# Patient Record
Sex: Female | Born: 1960 | Race: Black or African American | Hispanic: No | Smoking: Former smoker
Health system: Southern US, Community
[De-identification: ages and names within clinical notes are randomized; demographics above are authoritative.]

## PROBLEM LIST (undated history)

## (undated) DIAGNOSIS — E119 Type 2 diabetes mellitus without complications: Secondary | ICD-10-CM

## (undated) DIAGNOSIS — R609 Edema, unspecified: Secondary | ICD-10-CM

## (undated) DIAGNOSIS — E876 Hypokalemia: Secondary | ICD-10-CM

## (undated) DIAGNOSIS — R739 Hyperglycemia, unspecified: Secondary | ICD-10-CM

## (undated) DIAGNOSIS — R7303 Prediabetes: Secondary | ICD-10-CM

## (undated) DIAGNOSIS — M1990 Unspecified osteoarthritis, unspecified site: Secondary | ICD-10-CM

## (undated) DIAGNOSIS — I7300 Raynaud's syndrome without gangrene: Secondary | ICD-10-CM

## (undated) DIAGNOSIS — E785 Hyperlipidemia, unspecified: Secondary | ICD-10-CM

## (undated) DIAGNOSIS — M545 Low back pain: Secondary | ICD-10-CM

## (undated) DIAGNOSIS — J310 Chronic rhinitis: Secondary | ICD-10-CM

## (undated) DIAGNOSIS — G894 Chronic pain syndrome: Secondary | ICD-10-CM

## (undated) DIAGNOSIS — K219 Gastro-esophageal reflux disease without esophagitis: Secondary | ICD-10-CM

## (undated) DIAGNOSIS — Z87442 Personal history of urinary calculi: Secondary | ICD-10-CM

## (undated) DIAGNOSIS — E669 Obesity, unspecified: Secondary | ICD-10-CM

## (undated) DIAGNOSIS — F419 Anxiety disorder, unspecified: Secondary | ICD-10-CM

## (undated) DIAGNOSIS — J209 Acute bronchitis, unspecified: Secondary | ICD-10-CM

## (undated) DIAGNOSIS — R05 Cough: Secondary | ICD-10-CM

## (undated) DIAGNOSIS — M25519 Pain in unspecified shoulder: Secondary | ICD-10-CM

## (undated) DIAGNOSIS — R490 Dysphonia: Secondary | ICD-10-CM

## (undated) DIAGNOSIS — G4733 Obstructive sleep apnea (adult) (pediatric): Secondary | ICD-10-CM

## (undated) DIAGNOSIS — G4730 Sleep apnea, unspecified: Secondary | ICD-10-CM

## (undated) DIAGNOSIS — M797 Fibromyalgia: Secondary | ICD-10-CM

## (undated) DIAGNOSIS — J329 Chronic sinusitis, unspecified: Secondary | ICD-10-CM

## (undated) DIAGNOSIS — E079 Disorder of thyroid, unspecified: Secondary | ICD-10-CM

## (undated) DIAGNOSIS — R252 Cramp and spasm: Secondary | ICD-10-CM

## (undated) DIAGNOSIS — H409 Unspecified glaucoma: Secondary | ICD-10-CM

## (undated) DIAGNOSIS — J382 Nodules of vocal cords: Secondary | ICD-10-CM

## (undated) DIAGNOSIS — R040 Epistaxis: Secondary | ICD-10-CM

## (undated) DIAGNOSIS — K635 Polyp of colon: Secondary | ICD-10-CM

## (undated) DIAGNOSIS — E782 Mixed hyperlipidemia: Secondary | ICD-10-CM

## (undated) DIAGNOSIS — D259 Leiomyoma of uterus, unspecified: Secondary | ICD-10-CM

## (undated) DIAGNOSIS — N951 Menopausal and female climacteric states: Secondary | ICD-10-CM

## (undated) DIAGNOSIS — E039 Hypothyroidism, unspecified: Secondary | ICD-10-CM

## (undated) DIAGNOSIS — E1169 Type 2 diabetes mellitus with other specified complication: Secondary | ICD-10-CM

## (undated) DIAGNOSIS — I10 Essential (primary) hypertension: Secondary | ICD-10-CM

## (undated) DIAGNOSIS — K589 Irritable bowel syndrome without diarrhea: Secondary | ICD-10-CM

## (undated) DIAGNOSIS — F329 Major depressive disorder, single episode, unspecified: Secondary | ICD-10-CM

## (undated) DIAGNOSIS — R0600 Dyspnea, unspecified: Secondary | ICD-10-CM

## (undated) DIAGNOSIS — R300 Dysuria: Secondary | ICD-10-CM

## (undated) DIAGNOSIS — F32A Depression, unspecified: Secondary | ICD-10-CM

## (undated) HISTORY — PX: OOPHORECTOMY: SHX86

## (undated) HISTORY — DX: Obesity, unspecified: E66.9

## (undated) HISTORY — DX: Acute bronchitis, unspecified: J20.9

## (undated) HISTORY — PX: CYSTECTOMY: SUR359

## (undated) HISTORY — PX: BREAST EXCISIONAL BIOPSY: SUR124

## (undated) HISTORY — DX: Raynaud's syndrome without gangrene: I73.00

## (undated) HISTORY — DX: Hyperglycemia, unspecified: R73.9

## (undated) HISTORY — DX: Type 2 diabetes mellitus without complications: E11.9

## (undated) HISTORY — DX: Polyp of colon: K63.5

## (undated) HISTORY — PX: DILATION AND CURETTAGE OF UTERUS: SHX78

## (undated) HISTORY — DX: Cough: R05

## (undated) HISTORY — DX: Chronic sinusitis, unspecified: J32.9

## (undated) HISTORY — DX: Cramp and spasm: R25.2

## (undated) HISTORY — DX: Menopausal and female climacteric states: N95.1

## (undated) HISTORY — DX: Nodules of vocal cords: J38.2

## (undated) HISTORY — DX: Major depressive disorder, single episode, unspecified: F32.9

## (undated) HISTORY — DX: Low back pain: M54.5

## (undated) HISTORY — DX: Hyperlipidemia, unspecified: E78.5

## (undated) HISTORY — PX: ABDOMINAL HYSTERECTOMY: SHX81

## (undated) HISTORY — DX: Epistaxis: R04.0

## (undated) HISTORY — PX: OTHER SURGICAL HISTORY: SHX169

## (undated) HISTORY — PX: POLYPECTOMY: SHX149

## (undated) HISTORY — DX: Dysphonia: R49.0

## (undated) HISTORY — DX: Gastro-esophageal reflux disease without esophagitis: K21.9

## (undated) HISTORY — PX: BREAST SURGERY: SHX581

## (undated) HISTORY — DX: Mixed hyperlipidemia: E78.2

## (undated) HISTORY — DX: Leiomyoma of uterus, unspecified: D25.9

## (undated) HISTORY — DX: Type 2 diabetes mellitus with other specified complication: E11.69

## (undated) HISTORY — DX: Depression, unspecified: F32.A

## (undated) HISTORY — PX: ROTATOR CUFF REPAIR: SHX139

## (undated) HISTORY — DX: Dysuria: R30.0

## (undated) HISTORY — DX: Obstructive sleep apnea (adult) (pediatric): G47.33

## (undated) HISTORY — DX: Irritable bowel syndrome without diarrhea: K58.9

## (undated) HISTORY — DX: Disorder of thyroid, unspecified: E07.9

## (undated) HISTORY — PX: HERNIA REPAIR: SHX51

## (undated) HISTORY — DX: Sleep apnea, unspecified: G47.30

## (undated) HISTORY — DX: Edema, unspecified: R60.9

## (undated) HISTORY — DX: Hypokalemia: E87.6

## (undated) HISTORY — DX: Essential (primary) hypertension: I10

## (undated) HISTORY — PX: CHOLECYSTECTOMY: SHX55

## (undated) HISTORY — DX: Anxiety disorder, unspecified: F41.9

## (undated) HISTORY — DX: Chronic pain syndrome: G89.4

## (undated) HISTORY — DX: Chronic rhinitis: J31.0

## (undated) HISTORY — DX: Unspecified glaucoma: H40.9

## (undated) HISTORY — DX: Pain in unspecified shoulder: M25.519

---

## 1998-01-05 ENCOUNTER — Ambulatory Visit (HOSPITAL_COMMUNITY): Admission: RE | Admit: 1998-01-05 | Discharge: 1998-01-05 | Payer: Self-pay | Admitting: Gastroenterology

## 1999-08-09 ENCOUNTER — Other Ambulatory Visit: Admission: RE | Admit: 1999-08-09 | Discharge: 1999-08-09 | Payer: Self-pay | Admitting: Obstetrics and Gynecology

## 1999-08-12 ENCOUNTER — Encounter: Admission: RE | Admit: 1999-08-12 | Discharge: 1999-08-12 | Payer: Self-pay | Admitting: Internal Medicine

## 1999-08-12 ENCOUNTER — Encounter: Payer: Self-pay | Admitting: Internal Medicine

## 1999-11-15 ENCOUNTER — Encounter (INDEPENDENT_AMBULATORY_CARE_PROVIDER_SITE_OTHER): Payer: Self-pay | Admitting: Specialist

## 1999-11-15 ENCOUNTER — Inpatient Hospital Stay (HOSPITAL_COMMUNITY): Admission: RE | Admit: 1999-11-15 | Discharge: 1999-11-17 | Payer: Self-pay | Admitting: Obstetrics and Gynecology

## 2000-07-06 ENCOUNTER — Ambulatory Visit (HOSPITAL_BASED_OUTPATIENT_CLINIC_OR_DEPARTMENT_OTHER): Admission: RE | Admit: 2000-07-06 | Discharge: 2000-07-06 | Payer: Self-pay | Admitting: Otolaryngology

## 2000-08-28 ENCOUNTER — Other Ambulatory Visit: Admission: RE | Admit: 2000-08-28 | Discharge: 2000-08-28 | Payer: Self-pay | Admitting: Obstetrics and Gynecology

## 2000-11-20 ENCOUNTER — Encounter: Admission: RE | Admit: 2000-11-20 | Discharge: 2000-11-21 | Payer: Self-pay | Admitting: Internal Medicine

## 2000-11-22 ENCOUNTER — Encounter: Admission: RE | Admit: 2000-11-22 | Discharge: 2000-12-27 | Payer: Self-pay | Admitting: Internal Medicine

## 2000-12-14 ENCOUNTER — Ambulatory Visit (HOSPITAL_COMMUNITY): Admission: RE | Admit: 2000-12-14 | Discharge: 2000-12-14 | Payer: Self-pay | Admitting: Sports Medicine

## 2000-12-14 ENCOUNTER — Encounter: Payer: Self-pay | Admitting: Sports Medicine

## 2001-09-10 ENCOUNTER — Other Ambulatory Visit: Admission: RE | Admit: 2001-09-10 | Discharge: 2001-09-10 | Payer: Self-pay | Admitting: Obstetrics and Gynecology

## 2002-09-25 ENCOUNTER — Other Ambulatory Visit: Admission: RE | Admit: 2002-09-25 | Discharge: 2002-09-25 | Payer: Self-pay | Admitting: Obstetrics and Gynecology

## 2002-12-16 ENCOUNTER — Encounter (INDEPENDENT_AMBULATORY_CARE_PROVIDER_SITE_OTHER): Payer: Self-pay | Admitting: *Deleted

## 2002-12-16 ENCOUNTER — Ambulatory Visit (HOSPITAL_COMMUNITY): Admission: RE | Admit: 2002-12-16 | Discharge: 2002-12-16 | Payer: Self-pay | Admitting: Gastroenterology

## 2003-09-30 ENCOUNTER — Other Ambulatory Visit: Admission: RE | Admit: 2003-09-30 | Discharge: 2003-09-30 | Payer: Self-pay | Admitting: Obstetrics and Gynecology

## 2003-12-11 ENCOUNTER — Inpatient Hospital Stay (HOSPITAL_COMMUNITY): Admission: RE | Admit: 2003-12-11 | Discharge: 2003-12-13 | Payer: Self-pay | Admitting: Obstetrics and Gynecology

## 2003-12-11 ENCOUNTER — Encounter (INDEPENDENT_AMBULATORY_CARE_PROVIDER_SITE_OTHER): Payer: Self-pay | Admitting: *Deleted

## 2003-12-20 ENCOUNTER — Inpatient Hospital Stay (HOSPITAL_COMMUNITY): Admission: AD | Admit: 2003-12-20 | Discharge: 2003-12-23 | Payer: Self-pay

## 2004-11-10 ENCOUNTER — Other Ambulatory Visit: Admission: RE | Admit: 2004-11-10 | Discharge: 2004-11-10 | Payer: Self-pay | Admitting: Obstetrics and Gynecology

## 2005-04-30 ENCOUNTER — Encounter: Payer: Self-pay | Admitting: Internal Medicine

## 2005-04-30 ENCOUNTER — Encounter: Admission: RE | Admit: 2005-04-30 | Discharge: 2005-04-30 | Payer: Self-pay | Admitting: Internal Medicine

## 2005-05-09 ENCOUNTER — Encounter: Admission: RE | Admit: 2005-05-09 | Discharge: 2005-05-25 | Payer: Self-pay | Admitting: Internal Medicine

## 2005-09-28 ENCOUNTER — Encounter: Payer: Self-pay | Admitting: Internal Medicine

## 2005-10-24 ENCOUNTER — Ambulatory Visit (HOSPITAL_COMMUNITY): Admission: RE | Admit: 2005-10-24 | Discharge: 2005-10-24 | Payer: Self-pay | Admitting: Rheumatology

## 2005-11-27 ENCOUNTER — Ambulatory Visit: Payer: Self-pay | Admitting: Pulmonary Disease

## 2006-03-27 ENCOUNTER — Encounter: Payer: Self-pay | Admitting: Internal Medicine

## 2006-08-12 ENCOUNTER — Ambulatory Visit (HOSPITAL_BASED_OUTPATIENT_CLINIC_OR_DEPARTMENT_OTHER): Admission: RE | Admit: 2006-08-12 | Discharge: 2006-08-12 | Payer: Self-pay | Admitting: Pulmonary Disease

## 2006-08-21 ENCOUNTER — Ambulatory Visit: Payer: Self-pay | Admitting: Pulmonary Disease

## 2006-08-28 ENCOUNTER — Ambulatory Visit: Payer: Self-pay | Admitting: Pulmonary Disease

## 2006-09-25 ENCOUNTER — Ambulatory Visit: Payer: Self-pay | Admitting: Pulmonary Disease

## 2006-10-02 ENCOUNTER — Encounter: Payer: Self-pay | Admitting: Internal Medicine

## 2006-11-15 ENCOUNTER — Emergency Department (HOSPITAL_COMMUNITY): Admission: EM | Admit: 2006-11-15 | Discharge: 2006-11-15 | Payer: Self-pay | Admitting: Emergency Medicine

## 2006-11-21 ENCOUNTER — Ambulatory Visit: Payer: Self-pay | Admitting: Pulmonary Disease

## 2007-02-13 ENCOUNTER — Ambulatory Visit: Payer: Self-pay | Admitting: Pulmonary Disease

## 2007-02-28 ENCOUNTER — Ambulatory Visit: Payer: Self-pay | Admitting: Pulmonary Disease

## 2007-02-28 LAB — PULMONARY FUNCTION TEST

## 2007-03-12 ENCOUNTER — Encounter (INDEPENDENT_AMBULATORY_CARE_PROVIDER_SITE_OTHER): Payer: Self-pay | Admitting: Gastroenterology

## 2007-03-12 ENCOUNTER — Ambulatory Visit (HOSPITAL_COMMUNITY): Admission: RE | Admit: 2007-03-12 | Discharge: 2007-03-12 | Payer: Self-pay | Admitting: Gastroenterology

## 2007-03-25 LAB — HM COLONOSCOPY

## 2007-06-06 ENCOUNTER — Ambulatory Visit: Payer: Self-pay | Admitting: Internal Medicine

## 2007-06-06 DIAGNOSIS — E039 Hypothyroidism, unspecified: Secondary | ICD-10-CM | POA: Insufficient documentation

## 2007-06-06 DIAGNOSIS — I10 Essential (primary) hypertension: Secondary | ICD-10-CM | POA: Insufficient documentation

## 2007-06-06 DIAGNOSIS — G4733 Obstructive sleep apnea (adult) (pediatric): Secondary | ICD-10-CM | POA: Insufficient documentation

## 2007-06-06 DIAGNOSIS — Z860100 Personal history of colon polyps, unspecified: Secondary | ICD-10-CM | POA: Insufficient documentation

## 2007-06-06 DIAGNOSIS — K219 Gastro-esophageal reflux disease without esophagitis: Secondary | ICD-10-CM | POA: Insufficient documentation

## 2007-06-06 DIAGNOSIS — E782 Mixed hyperlipidemia: Secondary | ICD-10-CM

## 2007-06-06 DIAGNOSIS — F4323 Adjustment disorder with mixed anxiety and depressed mood: Secondary | ICD-10-CM | POA: Insufficient documentation

## 2007-06-06 HISTORY — DX: Mixed hyperlipidemia: E78.2

## 2007-07-05 ENCOUNTER — Ambulatory Visit: Payer: Self-pay | Admitting: Internal Medicine

## 2007-07-05 LAB — CONVERTED CEMR LAB
ALT: 41 units/L — ABNORMAL HIGH (ref 0–35)
AST: 29 units/L (ref 0–37)
Albumin: 3.7 g/dL (ref 3.5–5.2)
Alkaline Phosphatase: 64 units/L (ref 39–117)
BUN: 10 mg/dL (ref 6–23)
Basophils Absolute: 0.1 10*3/uL (ref 0.0–0.1)
Basophils Relative: 1.6 % — ABNORMAL HIGH (ref 0.0–1.0)
Bilirubin, Direct: 0.1 mg/dL (ref 0.0–0.3)
CO2: 28 meq/L (ref 19–32)
Calcium: 9.4 mg/dL (ref 8.4–10.5)
Chloride: 108 meq/L (ref 96–112)
Cholesterol: 197 mg/dL (ref 0–200)
Creatinine, Ser: 1 mg/dL (ref 0.4–1.2)
Eosinophils Absolute: 0.1 10*3/uL (ref 0.0–0.6)
Eosinophils Relative: 2.5 % (ref 0.0–5.0)
GFR calc Af Amer: 77 mL/min
GFR calc non Af Amer: 63 mL/min
Glucose, Bld: 104 mg/dL — ABNORMAL HIGH (ref 70–99)
HCT: 39.9 % (ref 36.0–46.0)
HDL: 32.5 mg/dL — ABNORMAL LOW (ref >39.0)
Hemoglobin: 13.9 g/dL (ref 12.0–15.0)
Hgb A1c MFr Bld: 6.1 % — ABNORMAL HIGH (ref 4.6–6.0)
LDL Cholesterol: 126 mg/dL — ABNORMAL HIGH (ref 0–99)
Lymphocytes Relative: 46.7 % — ABNORMAL HIGH (ref 12.0–46.0)
MCHC: 34.8 g/dL (ref 30.0–36.0)
MCV: 88.7 fL (ref 78.0–100.0)
Monocytes Absolute: 0.5 10*3/uL (ref 0.2–0.7)
Monocytes Relative: 11 % (ref 3.0–11.0)
Neutro Abs: 1.8 10*3/uL (ref 1.4–7.7)
Neutrophils Relative %: 38.2 % — ABNORMAL LOW (ref 43.0–77.0)
Platelets: 336 10*3/uL (ref 150–400)
Potassium: 4 meq/L (ref 3.5–5.1)
RBC: 4.5 M/uL (ref 3.87–5.11)
RDW: 12.8 % (ref 11.5–14.6)
Sodium: 141 meq/L (ref 135–145)
TSH: 0.58 microintl units/mL (ref 0.35–5.50)
Total Bilirubin: 0.5 mg/dL (ref 0.3–1.2)
Total CHOL/HDL Ratio: 6.1
Total Protein: 6.6 g/dL (ref 6.0–8.3)
Triglycerides: 192 mg/dL — ABNORMAL HIGH (ref 0–149)
VLDL: 38 mg/dL (ref 0–40)
WBC: 4.7 10*3/uL (ref 4.5–10.5)

## 2007-07-07 ENCOUNTER — Encounter: Payer: Self-pay | Admitting: Internal Medicine

## 2007-07-22 ENCOUNTER — Ambulatory Visit: Payer: Self-pay | Admitting: Internal Medicine

## 2007-07-22 DIAGNOSIS — R7309 Other abnormal glucose: Secondary | ICD-10-CM | POA: Insufficient documentation

## 2007-07-22 DIAGNOSIS — L259 Unspecified contact dermatitis, unspecified cause: Secondary | ICD-10-CM | POA: Insufficient documentation

## 2007-07-22 DIAGNOSIS — IMO0001 Reserved for inherently not codable concepts without codable children: Secondary | ICD-10-CM | POA: Insufficient documentation

## 2007-07-26 ENCOUNTER — Ambulatory Visit: Payer: Self-pay | Admitting: Pulmonary Disease

## 2007-08-26 ENCOUNTER — Ambulatory Visit: Payer: Self-pay | Admitting: Internal Medicine

## 2007-08-26 ENCOUNTER — Telehealth: Payer: Self-pay | Admitting: Internal Medicine

## 2007-08-26 LAB — CONVERTED CEMR LAB
BUN: 16 mg/dL (ref 6–23)
CO2: 26 meq/L (ref 19–32)
Calcium: 9.7 mg/dL (ref 8.4–10.5)
Chloride: 104 meq/L (ref 96–112)
Creatinine, Ser: 1 mg/dL (ref 0.4–1.2)
GFR calc Af Amer: 77 mL/min
GFR calc non Af Amer: 63 mL/min
Glucose, Bld: 85 mg/dL (ref 70–99)
Potassium: 4 meq/L (ref 3.5–5.1)
Sodium: 138 meq/L (ref 135–145)
Total CK: 357 units/L (ref 7–177)

## 2007-08-27 ENCOUNTER — Encounter (INDEPENDENT_AMBULATORY_CARE_PROVIDER_SITE_OTHER): Payer: Self-pay | Admitting: *Deleted

## 2007-09-09 ENCOUNTER — Ambulatory Visit: Payer: Self-pay | Admitting: Internal Medicine

## 2007-09-25 ENCOUNTER — Encounter: Payer: Self-pay | Admitting: Internal Medicine

## 2007-10-03 ENCOUNTER — Encounter: Payer: Self-pay | Admitting: Internal Medicine

## 2007-10-03 ENCOUNTER — Encounter: Admission: RE | Admit: 2007-10-03 | Discharge: 2007-10-03 | Payer: Self-pay | Admitting: Internal Medicine

## 2007-10-21 ENCOUNTER — Telehealth: Payer: Self-pay | Admitting: Internal Medicine

## 2007-10-31 ENCOUNTER — Encounter: Payer: Self-pay | Admitting: Internal Medicine

## 2007-11-11 ENCOUNTER — Ambulatory Visit: Payer: Self-pay | Admitting: Internal Medicine

## 2007-11-11 DIAGNOSIS — E049 Nontoxic goiter, unspecified: Secondary | ICD-10-CM | POA: Insufficient documentation

## 2007-11-11 DIAGNOSIS — J309 Allergic rhinitis, unspecified: Secondary | ICD-10-CM | POA: Insufficient documentation

## 2007-12-10 ENCOUNTER — Telehealth: Payer: Self-pay | Admitting: Internal Medicine

## 2007-12-10 ENCOUNTER — Ambulatory Visit (HOSPITAL_COMMUNITY): Admission: RE | Admit: 2007-12-10 | Discharge: 2007-12-10 | Payer: Self-pay | Admitting: Internal Medicine

## 2007-12-12 ENCOUNTER — Telehealth: Payer: Self-pay | Admitting: Internal Medicine

## 2007-12-27 ENCOUNTER — Encounter: Payer: Self-pay | Admitting: Internal Medicine

## 2008-01-13 ENCOUNTER — Telehealth: Payer: Self-pay | Admitting: Internal Medicine

## 2008-01-13 ENCOUNTER — Ambulatory Visit: Payer: Self-pay | Admitting: Internal Medicine

## 2008-01-13 LAB — CONVERTED CEMR LAB
Hgb A1c MFr Bld: 5.6 % (ref 4.6–6.0)
TSH: 0.3 microintl units/mL — ABNORMAL LOW (ref 0.35–5.50)
Vit D, 1,25-Dihydroxy: 21 — ABNORMAL LOW (ref 30–89)

## 2008-02-04 LAB — CONVERTED CEMR LAB

## 2008-02-06 ENCOUNTER — Ambulatory Visit: Payer: Self-pay | Admitting: Internal Medicine

## 2008-02-06 LAB — CONVERTED CEMR LAB
Hgb A1c MFr Bld: 5.6 % (ref 4.6–6.0)
TSH: 0.82 microintl units/mL (ref 0.35–5.50)
Vit D, 1,25-Dihydroxy: 23 — ABNORMAL LOW (ref 30–89)

## 2008-02-11 ENCOUNTER — Ambulatory Visit: Payer: Self-pay | Admitting: Internal Medicine

## 2008-02-14 ENCOUNTER — Encounter: Payer: Self-pay | Admitting: Internal Medicine

## 2008-03-03 ENCOUNTER — Ambulatory Visit: Payer: Self-pay | Admitting: Internal Medicine

## 2008-03-03 ENCOUNTER — Encounter: Payer: Self-pay | Admitting: Internal Medicine

## 2008-06-09 ENCOUNTER — Ambulatory Visit: Payer: Self-pay | Admitting: Internal Medicine

## 2008-06-09 LAB — CONVERTED CEMR LAB
ALT: 22 units/L (ref 0–35)
AST: 19 units/L (ref 0–37)
BUN: 10 mg/dL (ref 6–23)
CO2: 29 meq/L (ref 19–32)
CRP, High Sensitivity: 5 (ref 0.00–5.00)
Calcium: 9.7 mg/dL (ref 8.4–10.5)
Chloride: 103 meq/L (ref 96–112)
Cholesterol: 184 mg/dL (ref 0–200)
Creatinine, Ser: 0.9 mg/dL (ref 0.4–1.2)
GFR calc Af Amer: 86 mL/min
GFR calc non Af Amer: 71 mL/min
Glucose, Bld: 93 mg/dL (ref 70–99)
HDL: 38.6 mg/dL — ABNORMAL LOW (ref >39.0)
Hgb A1c MFr Bld: 5.9 % (ref 4.6–6.0)
LDL Cholesterol: 108 mg/dL — ABNORMAL HIGH (ref 0–99)
Potassium: 4.2 meq/L (ref 3.5–5.1)
Sodium: 140 meq/L (ref 135–145)
Total CHOL/HDL Ratio: 4.8
Triglycerides: 188 mg/dL — ABNORMAL HIGH (ref 0–149)
VLDL: 38 mg/dL (ref 0–40)

## 2008-06-16 ENCOUNTER — Ambulatory Visit: Payer: Self-pay | Admitting: Internal Medicine

## 2008-06-16 DIAGNOSIS — M545 Low back pain, unspecified: Secondary | ICD-10-CM | POA: Insufficient documentation

## 2008-06-16 DIAGNOSIS — R0789 Other chest pain: Secondary | ICD-10-CM | POA: Insufficient documentation

## 2008-06-30 ENCOUNTER — Ambulatory Visit: Payer: Self-pay | Admitting: Internal Medicine

## 2008-08-10 ENCOUNTER — Ambulatory Visit: Payer: Self-pay | Admitting: Pulmonary Disease

## 2008-08-19 ENCOUNTER — Ambulatory Visit: Payer: Self-pay | Admitting: Internal Medicine

## 2008-08-19 LAB — CONVERTED CEMR LAB: TSH: 0.04 microintl units/mL — ABNORMAL LOW (ref 0.35–5.50)

## 2008-08-20 ENCOUNTER — Telehealth: Payer: Self-pay | Admitting: Internal Medicine

## 2008-09-14 ENCOUNTER — Telehealth: Payer: Self-pay | Admitting: Internal Medicine

## 2008-09-28 ENCOUNTER — Ambulatory Visit: Payer: Self-pay | Admitting: Internal Medicine

## 2008-09-28 LAB — CONVERTED CEMR LAB
Free T4: 0.5 ng/dL — ABNORMAL LOW (ref 0.6–1.6)
T3, Free: 3.4 pg/mL (ref 2.3–4.2)
TSH: 0.47 microintl units/mL (ref 0.35–5.50)
Total CK: 208 units/L — ABNORMAL HIGH (ref 7–177)

## 2008-10-05 ENCOUNTER — Ambulatory Visit: Payer: Self-pay | Admitting: Internal Medicine

## 2008-10-05 DIAGNOSIS — R143 Flatulence: Secondary | ICD-10-CM | POA: Insufficient documentation

## 2008-10-05 DIAGNOSIS — R141 Gas pain: Secondary | ICD-10-CM | POA: Insufficient documentation

## 2008-10-05 DIAGNOSIS — R142 Eructation: Secondary | ICD-10-CM

## 2008-10-05 LAB — CONVERTED CEMR LAB
Bilirubin Urine: NEGATIVE
Blood in Urine, dipstick: NEGATIVE
Glucose, Urine, Semiquant: NEGATIVE
Ketones, urine, test strip: NEGATIVE
Nitrite: NEGATIVE
Protein, U semiquant: 100
Specific Gravity, Urine: 1.02
Urobilinogen, UA: 0.2
WBC Urine, dipstick: NEGATIVE
pH: 6.5

## 2009-01-29 ENCOUNTER — Ambulatory Visit: Payer: Self-pay | Admitting: Internal Medicine

## 2009-01-29 LAB — CONVERTED CEMR LAB
ALT: 19 units/L (ref 0–35)
AST: 24 units/L (ref 0–37)
Albumin: 3.6 g/dL (ref 3.5–5.2)
Alkaline Phosphatase: 48 units/L (ref 39–117)
BUN: 7 mg/dL (ref 6–23)
Bilirubin, Direct: 0.1 mg/dL (ref 0.0–0.3)
CO2: 19 meq/L (ref 19–32)
Calcium: 9.1 mg/dL (ref 8.4–10.5)
Chloride: 102 meq/L (ref 96–112)
Cholesterol: 173 mg/dL (ref 0–200)
Creatinine, Ser: 0.9 mg/dL (ref 0.4–1.2)
Direct LDL: 91.9 mg/dL
GFR calc non Af Amer: 85.88 mL/min (ref >60)
Glucose, Bld: 78 mg/dL (ref 70–99)
HDL: 51.1 mg/dL (ref >39.00)
Hgb A1c MFr Bld: 5.7 % (ref 4.6–6.5)
Potassium: 4.3 meq/L (ref 3.5–5.1)
Sodium: 135 meq/L (ref 135–145)
TSH: 0.47 microintl units/mL (ref 0.35–5.50)
Total Bilirubin: 0.6 mg/dL (ref 0.3–1.2)
Total CHOL/HDL Ratio: 3
Total Protein: 6.6 g/dL (ref 6.0–8.3)
Triglycerides: 250 mg/dL — ABNORMAL HIGH (ref 0.0–149.0)
VLDL: 50 mg/dL — ABNORMAL HIGH (ref 0.0–40.0)

## 2009-02-05 ENCOUNTER — Ambulatory Visit: Payer: Self-pay | Admitting: Internal Medicine

## 2009-02-05 DIAGNOSIS — R0609 Other forms of dyspnea: Secondary | ICD-10-CM | POA: Insufficient documentation

## 2009-02-05 DIAGNOSIS — R0989 Other specified symptoms and signs involving the circulatory and respiratory systems: Secondary | ICD-10-CM | POA: Insufficient documentation

## 2009-02-09 LAB — CONVERTED CEMR LAB: Pap Smear: NORMAL

## 2009-03-15 ENCOUNTER — Emergency Department (HOSPITAL_COMMUNITY): Admission: EM | Admit: 2009-03-15 | Discharge: 2009-03-15 | Payer: Self-pay | Admitting: Family Medicine

## 2009-08-09 ENCOUNTER — Ambulatory Visit: Payer: Self-pay | Admitting: Internal Medicine

## 2009-08-09 LAB — CONVERTED CEMR LAB
BUN: 7 mg/dL (ref 6–23)
CO2: 27 meq/L (ref 19–32)
Calcium: 9.1 mg/dL (ref 8.4–10.5)
Chloride: 108 meq/L (ref 96–112)
Cholesterol: 155 mg/dL (ref 0–200)
Creatinine, Ser: 0.9 mg/dL (ref 0.4–1.2)
GFR calc non Af Amer: 85.69 mL/min (ref >60)
Glucose, Bld: 90 mg/dL (ref 70–99)
HDL: 47 mg/dL (ref >39.00)
Hgb A1c MFr Bld: 6 % (ref 4.6–6.5)
LDL Cholesterol: 86 mg/dL (ref 0–99)
Potassium: 3.8 meq/L (ref 3.5–5.1)
Sodium: 141 meq/L (ref 135–145)
Total CHOL/HDL Ratio: 3
Triglycerides: 109 mg/dL (ref 0.0–149.0)
VLDL: 21.8 mg/dL (ref 0.0–40.0)

## 2009-08-20 ENCOUNTER — Ambulatory Visit: Payer: Self-pay | Admitting: Internal Medicine

## 2009-10-11 ENCOUNTER — Telehealth: Payer: Self-pay | Admitting: Internal Medicine

## 2009-10-11 ENCOUNTER — Ambulatory Visit: Payer: Self-pay | Admitting: Internal Medicine

## 2009-10-11 LAB — CONVERTED CEMR LAB
BUN: 10 mg/dL (ref 6–23)
CO2: 26 meq/L (ref 19–32)
Calcium: 9.4 mg/dL (ref 8.4–10.5)
Chloride: 111 meq/L (ref 96–112)
Creatinine, Ser: 0.8 mg/dL (ref 0.4–1.2)
GFR calc non Af Amer: 95.34 mL/min (ref >60)
Glucose, Bld: 123 mg/dL — ABNORMAL HIGH (ref 70–99)
Potassium: 4.1 meq/L (ref 3.5–5.1)
Sodium: 142 meq/L (ref 135–145)
TSH: 0.07 microintl units/mL — ABNORMAL LOW (ref 0.35–5.50)

## 2009-10-18 ENCOUNTER — Ambulatory Visit: Payer: Self-pay | Admitting: Internal Medicine

## 2009-12-14 ENCOUNTER — Ambulatory Visit: Payer: Self-pay | Admitting: Internal Medicine

## 2009-12-14 ENCOUNTER — Telehealth: Payer: Self-pay | Admitting: Internal Medicine

## 2009-12-14 LAB — CONVERTED CEMR LAB: TSH: 0.5 microintl units/mL (ref 0.35–5.50)

## 2010-01-14 ENCOUNTER — Ambulatory Visit: Payer: Self-pay | Admitting: Internal Medicine

## 2010-01-14 DIAGNOSIS — J310 Chronic rhinitis: Secondary | ICD-10-CM | POA: Insufficient documentation

## 2010-02-16 ENCOUNTER — Telehealth: Payer: Self-pay | Admitting: Internal Medicine

## 2010-02-18 ENCOUNTER — Ambulatory Visit: Payer: Self-pay | Admitting: Internal Medicine

## 2010-02-23 ENCOUNTER — Ambulatory Visit (HOSPITAL_COMMUNITY): Admission: RE | Admit: 2010-02-23 | Discharge: 2010-02-23 | Payer: Self-pay | Admitting: Internal Medicine

## 2010-02-24 ENCOUNTER — Telehealth: Payer: Self-pay | Admitting: Internal Medicine

## 2010-03-01 LAB — HM MAMMOGRAPHY: HM Mammogram: NORMAL

## 2010-03-01 LAB — CONVERTED CEMR LAB: Pap Smear: NORMAL

## 2010-03-09 ENCOUNTER — Telehealth: Payer: Self-pay | Admitting: Internal Medicine

## 2010-04-06 LAB — CONVERTED CEMR LAB
BUN: 12 mg/dL (ref 6–23)
CO2: 27 meq/L (ref 19–32)
Calcium: 9.5 mg/dL (ref 8.4–10.5)
Chloride: 106 meq/L (ref 96–112)
Creatinine, Ser: 1 mg/dL (ref 0.4–1.2)
GFR calc non Af Amer: 79.33 mL/min (ref >60.00)
Glucose, Bld: 89 mg/dL (ref 70–99)
Hgb A1c MFr Bld: 5.9 % (ref 4.6–6.5)
Potassium: 4.4 meq/L (ref 3.5–5.1)
Sodium: 140 meq/L (ref 135–145)

## 2010-04-11 ENCOUNTER — Encounter: Payer: Self-pay | Admitting: Internal Medicine

## 2010-04-11 ENCOUNTER — Ambulatory Visit (HOSPITAL_BASED_OUTPATIENT_CLINIC_OR_DEPARTMENT_OTHER)
Admission: RE | Admit: 2010-04-11 | Discharge: 2010-04-11 | Payer: Self-pay | Source: Home / Self Care | Attending: Internal Medicine | Admitting: Internal Medicine

## 2010-04-11 ENCOUNTER — Ambulatory Visit: Payer: Self-pay | Admitting: Internal Medicine

## 2010-04-11 DIAGNOSIS — R05 Cough: Secondary | ICD-10-CM

## 2010-04-11 DIAGNOSIS — R059 Cough, unspecified: Secondary | ICD-10-CM | POA: Insufficient documentation

## 2010-05-14 ENCOUNTER — Encounter: Payer: Self-pay | Admitting: Internal Medicine

## 2010-05-24 NOTE — Progress Notes (Signed)
Summary: elevated BP  Phone Note Call from Patient   Caller: Patient Call For: D. Thomos Lemons DO Summary of Call: Pt called and states that her BP at work yesterday was 168/104 and 150/100. Pt wanted to know if you want to change her BP medication? I advised pt she would need to be seen in the office. Pt declined appt today as she wants to see you. Could not come in before Friday. Appt scheduled for 3pm 02/18/10. Nicki Guadalajara Fergerson CMA Duncan Dull)  February 16, 2010 11:14 AM   Follow-up for Phone Call        keep appt on 10/28  I suggest we change hctz to losartan / hctz -  see rx Follow-up by: D. Thomos Lemons DO,  February 16, 2010 11:47 AM  Additional Follow-up for Phone Call Additional follow up Details #1::        Left message on machine to return my call. Nicki Guadalajara Fergerson CMA Duncan Dull)  February 16, 2010 12:01 PM     Additional Follow-up for Phone Call Additional follow up Details #2::    Pt notified and voices understanding. Nicki Guadalajara Fergerson CMA Duncan Dull)  February 16, 2010 1:13 PM   New/Updated Medications: LOSARTAN POTASSIUM-HCTZ 50-12.5 MG TABS (LOSARTAN POTASSIUM-HCTZ) one by mouth once daily Prescriptions: LOSARTAN POTASSIUM-HCTZ 50-12.5 MG TABS (LOSARTAN POTASSIUM-HCTZ) one by mouth once daily  #30 x 0   Entered and Authorized by:   D. Thomos Lemons DO   Signed by:   D. Thomos Lemons DO on 02/16/2010   Method used:   Electronically to        [facility]* (retail)       [address]       [address] [place] Shipping/mailing       [place], [place]  [address]       Ph: [phone]       Fax: [phone]   RxID:   [id number]

## 2010-05-24 NOTE — Miscellaneous (Signed)
Summary: Medication Refill  Clinical Lists Changes  Medications: Changed medication from BYSTOLIC 5 MG  TABS (NEBIVOLOL HCL) one by mouth qd to BYSTOLIC 5 MG  TABS (NEBIVOLOL HCL) Take 1 tablet by mouth once a day - Signed Changed medication from POTASSIUM CHLORIDE CRYS CR 20 MEQ  TBCR (POTASSIUM CHLORIDE CRYS CR) Take 1 tablet by mouth once a day to POTASSIUM CHLORIDE CRYS CR 20 MEQ  TBCR (POTASSIUM CHLORIDE CRYS CR) Take 1 tablet by mouth once a day - Signed Rx of BYSTOLIC 5 MG  TABS (NEBIVOLOL HCL) Take 1 tablet by mouth once a day;  #90 x 0;  Signed;  Entered by: Glendell Docker CMA;  Authorized by: D. Thomos Lemons DO;  Method used: Electronically to [facility]*, [address], [address] Shipping/mailing, [place], [place]  [address], Ph: [phone], Fax: [phone] Rx of POTASSIUM CHLORIDE CRYS CR 20 MEQ  TBCR (POTASSIUM CHLORIDE CRYS CR) Take 1 tablet by mouth once a day;  #90 x 0;  Signed;  Entered by: Glendell Docker CMA;  Authorized by: D. Thomos Lemons DO;  Method used: Electronically to [facility]*, [address], [address] Shipping/mailing, [place], [place]  [address], Ph: [phone], Fax: [phone]    Prescriptions: POTASSIUM CHLORIDE CRYS CR 20 MEQ  TBCR (POTASSIUM CHLORIDE CRYS CR) Take 1 tablet by mouth once a day  #90 x 0   Entered by:   Glendell Docker CMA   Authorized by:   D. Thomos Lemons DO   Signed by:   Glendell Docker CMA on 12/27/2007   Method used:   Electronically to        [facility]* (retail)       [address]       [address] [place] [address]mailing       [place], [place]  [address]       Ph: [phone]       Fax: [phone]   RxID:   [id number] BYSTOLIC 5 MG  TABS (NEBIVOLOL HCL) Take 1 tablet by mouth once a day  #90 x 0   Entered by:   Glendell Docker CMA   Authorized by:   D. Thomos Lemons DO   Signed by:   Glendell Docker CMA on 12/27/2007   Method used:   Electronically to        [facility]* (retail)       [address]       [address] [place] [address]mailing       [place], [place]  [address]       Ph: [phone]       Fax: [phone]   RxID:   [id number]

## 2010-05-24 NOTE — Miscellaneous (Signed)
Summary: Bystolic  Clinical Lists Changes  Medications: Changed medication from BYSTOLIC 5 MG  TABS (NEBIVOLOL HCL) Take 1 tablet by mouth once a day to BYSTOLIC 5 MG  TABS (NEBIVOLOL HCL) Take 1 tablet by mouth once a day - Signed Rx of BYSTOLIC 5 MG  TABS (NEBIVOLOL HCL) Take 1 tablet by mouth once a day;  #90 x 3;  Signed;  Entered by: Glendell Docker CMA;  Authorized by: D. Thomos Lemons DO;  Method used: Electronically to [facility]*, [address], [address] Shipping/[address], [place], [place]  [address], Ph: [phone], Fax: [phone]    Prescriptions: BYSTOLIC 5 MG  TABS (NEBIVOLOL HCL) Take 1 tablet by mouth once a day  #90 x 3   Entered by:   Glendell Docker CMA   Authorized by:   D. Thomos Lemons DO   Signed by:   Glendell Docker CMA on 02/14/2008   Method used:   Electronically to        [facility]* (retail)       [address]       [address] [place] [address]mailing       [place], [place]  [address]       Ph: [phone]       Fax: [phone]   RxID:   [id number]

## 2010-05-24 NOTE — Progress Notes (Signed)
Summary: [place] results   Phone Note Outgoing Call   Summary of Call: call patient-thyroid ultrasound normal. Initial call taken by: D. Thomos Lemons DO,  December 10, 2007 5:37 PM  Follow-up for Phone Call        patient informed per Dr Artist Pais instructions Follow-up by: Glendell Docker CMA,  December 11, 2007 10:51 AM

## 2010-05-24 NOTE — Progress Notes (Signed)
Summary: Blood Work  Phone Note Call from Patient Call back at [employer] [phone]   Caller: Patient Summary of Call: Patient called and left voice message requesting additional blood work. She would like to have a CPK and T3 along with her TSH level prior to her next office visit Initial call taken by: Glendell Docker CMA,  Sep 14, 2008 5:32 PM  Follow-up for Phone Call        ok to add.  I suggest free T4 and free T3.   244.90 CPK - use myalgia code Follow-up by: D. Thomos Lemons DO,  Sep 14, 2008 5:34 PM  Additional Follow-up for Phone Call Additional follow up Details #1::        patient adivsed additional blood work would be added per her request Additional Follow-up by: Glendell Docker CMA,  Sep 15, 2008 8:51 AM

## 2010-05-24 NOTE — Assessment & Plan Note (Signed)
Summary: sinus problems x 3 weeks/dt   Vital Signs:  Patient profile:   50 year old female Height:      64 inches Weight:      226.25 pounds BMI:     38.98 O2 Sat:      95 % on Room air Temp:     98.1 degrees F oral Pulse rate:   81 / minute Pulse rhythm:   regular Resp:     18 per minute BP sitting:   134 / 100  (left arm) Cuff size:   large  Vitals Entered By: Glendell Docker CMA (January 14, 2010 10:42 AM)  O2 Flow:  Room air CC: Sinus congestion Is Patient Diabetic? No Pain Assessment Patient in pain? no      Comments c/o sinus congestion, facial pain, nasal drainage, dry cough for the past 3 weeks. Tried nasal rinse, decogestants, with little imrpovement   Primary Care Provider:  Dondra Spry DO  CC:  Sinus congestion.  History of Present Illness: 50 y/o AA female co sinus pressure onset 3 weeks has been nasal saline rinse w/o improvment mucus is not discolored - clear sputum cough at night when she lays down using afrin over the counter - she has been using on and off for years   Preventive Screening-Counseling & Management  Alcohol-Tobacco     Smoking Status: quit  Allergies: 1)  ! Asa  Social History: Smoking Status:  quit  Physical Exam  General:  alert and overweight-appearing.   Ears:  R ear normal and L ear normal.   Nose:  no airflow obstruction and mucosal edema.   Mouth:  pharynx pink and moist and pharyngeal crowding.   Lungs:  normal respiratory effort and normal breath sounds.   Heart:  normal rate, regular rhythm, and no gallop.     Impression & Recommendations:  Problem # 1:  RHINITIS, CHRONIC (ICD-472.0) rhinitis exacerbated by chronic afrin use. Pt strongly advised to discontinue afrin use use combination of astelin and nasal steroids  Complete Medication List: 1)  Potassium Chloride Crys Cr 20 Meq Tbcr (Potassium chloride crys cr) .... Take 1 tablet by mouth once a day 2)  Allergy/congestion Relief 10-240 Mg Tb24  (Loratadine-pseudoephedrine) .... Take 1 tablet by mouth once a day 3)  Metformin Hcl 500 Mg Xr24h-tab (Metformin hcl) .Marland Kitchen.. 1 tab in am and 2 tabs in pm 4)  Triamcinolone Acetonide 0.1 % Crea (Triamcinolone acetonide) .... Apply bid 5)  Levothyroxine Sodium 75 Mcg Tabs (Levothyroxine sodium) .... One by mouth once daily 6)  Fish Oil 1000 Mg Caps (Omega-3 fatty acids) .Marland Kitchen.. 1 by mouth bid 7)  Green Tea 400 Mg Caps (green Tea (camillia Sinensis))  .Marland Kitchen.. 1 by mouth qd 8)  Ultra Minerals  .Marland KitchenMarland Kitchen. 1 by mouth bid 9)  Garlic  .Marland Kitchen.. 1 by mouth bid 10)  Selenium 200 Mcg Tabs (Selenium) .Marland Kitchen.. 1 by mouth qd 11)  Vitamin D 1,000 Iu  .Marland Kitchen.. 1 by mouth qd 12)  Biotin 5,017mcg  .Marland KitchenMarland Kitchen. 1 by mouth bid 13)  Super B-complex  .Marland KitchenMarland Kitchen. 1 by mouth bid 14)  Aspir-low 81 Mg Tbec (Aspirin) .Marland Kitchen.. 1 by mouth qd 15)  Benadryl 25 Mg Caps (Diphenhydramine hcl) .... By mouth at bedtime prn 16)  Hydrochlorothiazide 12.5 Mg Tabs (Hydrochlorothiazide) .... One by mouth once daily 17)  Clotrimazole-betamethasone 1-0.05 % Crea (Clotrimazole-betamethasone) .... Apply two times a day 18)  Azelastine Hcl 137 Mcg/spray Soln (Azelastine hcl) .... 2 sprays two times a day  19)  Veramyst 27.5 Mcg/spray Susp (Fluticasone furoate) .... 2 sprays each nostril once daily 20)  Hydrocod Polst-chlorphen Polst 10-8 Mg/43ml Lqcr (Hydrocod polst-chlorphen polst) .... 5 ml by mouth two times a day as needed for cough  Patient Instructions: 1)  Please schedule a follow-up appointment in 1 month. Prescriptions: HYDROCOD POLST-CHLORPHEN POLST 10-8 MG/5ML LQCR (HYDROCOD POLST-CHLORPHEN POLST) 5 ml by mouth two times a day as needed for cough  #60 x 0   Entered and Authorized by:   D. Thomos Lemons DO   Signed by:   D. Thomos Lemons DO on 01/14/2010   Method used:   Print then Give to Patient   RxID:   [id number] VERAMYST 27.5 MCG/SPRAY SUSP (FLUTICASONE FUROATE) 2 sprays each nostril once daily  #1 x 3   Entered and Authorized by:   D. Thomos Lemons DO   Signed by:    D. Thomos Lemons DO on 01/14/2010   Method used:   Electronically to        [facility]* (retail)       [address]       [address] Shipping/mailing       [place], [place]  [address]       Ph: [phone]       Fax: [phone]   RxID:   [id number] AZELASTINE HCL 137 MCG/SPRAY SOLN (AZELASTINE HCL) 2 sprays two times a day  #1 x 3   Entered and Authorized by:   D. Thomos Lemons DO   Signed by:   D. Thomos Lemons DO on 01/14/2010   Method used:   Electronically to        [facility]* (retail)       [address]       [address] [place] [address]mailing       [place], [place]  [address]       Ph: [phone]       Fax: [phone]   RxID:   [id number]   Current Allergies (reviewed today): ! ASA

## 2010-05-24 NOTE — Assessment & Plan Note (Signed)
Summary: FOLLOW UP   Vital Signs:  Patient Profile:   50 Years Old Female Height:     64 inches Weight:      221.25 pounds BMI:     38.11 Temp:     98.4 degrees F oral Pulse rate:   86 / minute Pulse rhythm:   regular Resp:     22 per minute BP sitting:   140 / 80  (right arm) Cuff size:   large  Vitals Entered By: Glendell Docker CMA (June 16, 2008 9:03 AM)             Is Patient Diabetic? No     PCP:  Dondra Spry DO  Chief Complaint:  follow up disease management, Back pain, and Abdominal pain.  History of Present Illness: Back Pain      This is a 50 year old woman who presents with Back pain.  The patient denies fever, chills, and weakness.  The pain is located in the left low back.  The pain began at work and at home.  The pain radiates to the left leg below the knee.  The pain is made worse by standing or walking and activity.  The pain is made better by activity and using ankle wt on left leg.  She has prev hx of LBP.   MRI of LS from 2007 reviewed.  Abdominal Pain      The patient also presents with Abdominal pain.  The patient denies nausea and vomiting.  The location of the pain is epigastric.  The pain is described as intermittent.  The pain is worse with stress.   She occasional experiences lower chest pain.  DM II borderline -  wt gain.  Dyspepsia History:      There is a prior history of GERD.       Current Allergies (reviewed today): ! ASA  Past Medical History:    GERD    Hypertension    Hypothyroidism    Chronic rhinosinusitis    Obstructive Sleep Apea    Obesity    Depression/Anxiety    Colonic polyps, hx of    Hyperlipidemia    Uterine Fibroids    Hoarseness              Endo - Dr. Lucianne Muss    ENT - Dr. Annalee Genta    GI - Dr. Matthias Hughs    Pulm - Dr. Shelle Iron    Rheum - Dr. 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     Physical Exam  General:     alert and overweight-appearing.   Head:     normocephalic and atraumatic.   Mouth:     Oral mucosa and oropharynx without lesions or exudates.   Neck:     supple and no masses.   Lungs:     normal respiratory effort and normal breath sounds.   Heart:     normal rate,  regular rhythm, and no gallop.   Abdomen:     soft and non-tender.   Extremities:     No lower extremity edema  Neurologic:     cranial nerves II-XII intact and gait normal.   Psych:     normally interactive, good eye contact, and slightly anxious.      Impression & Recommendations:  Problem # 1:  BACK PAIN, LUMBAR, CHRONIC (ICD-724.2) Pt with chronic LBP.   Her symptoms worse with activity.   She reports radiation of pain to left leg.   Prev MRI of LS spine in 2007 showed - Small to moderate sized central and right paracentral disc protrusion at L4-5.  This may affect the right L-5 nerve root.  Muscle strength and reflexes normal.   Trial of PT.  Orders: Physical Therapy Referral (PT)   Problem # 2:  GERD (ICD-530.81) Pt with intermittent epigastric pain.   Start PPI.    Her updated medication list for this problem includes:    Omeprazole 20 Mg Cpdr (Omeprazole) ..... One by mouth once daily 30 mins before evening meal   Problem # 3:  OTHER ABNORMAL GLUCOSE (ICD-790.29) A1c slightly worse.   I urged lifestyle changes.  Her updated medication list for this problem includes:    Metformin Hcl 500 Mg Xr24h-tab (Metformin hcl) .Marland Kitchen... 1 tab in am and 1 tabs in pm  Labs Reviewed: HgBA1c: 5.9 (06/09/2008)   Creat: 0.9 (06/09/2008)      Problem # 4:  CHEST PAIN,  ATYPICAL (ICD-786.59) Pt c/o epigastric pain and occ lower chest pain.   Symptoms are non exertional.   I suspect GERD.  EKG shows NSR at 66 bpm.    No acute changes.   Patient advised to call office if symptoms persist or worsen.   Complete Medication List: 1)  Potassium Chloride Crys Cr 20 Meq Tbcr (Potassium chloride crys cr) .... Take 1 tablet by mouth once a day 2)  Estrace 1 Mg Tabs (Estradiol) .... Take 1 tablet by mouth once a day 3)  Allergy/congestion Relief 10-240 Mg Tb24 (Loratadine-pseudoephedrine) .... Take 1 tablet by mouth once a day 4)  Benicar Hct 40-12.5 Mg Tabs (Olmesartan medoxomil-hctz) .... One by mouth once daily 5)  Metformin Hcl 500 Mg Xr24h-tab (Metformin hcl) .Marland Kitchen.. 1 tab in am and 1 tabs in pm 6)  Triamcinolone Acetonide 0.1 % Crea (Triamcinolone acetonide) .... Apply bid 7)  Bystolic 5 Mg Tabs (Nebivolol hcl) .... Take 1 tablet by mouth once a day 8)  Levothyroxine Sodium 112 Mcg Tabs (Levothyroxine sodium) .... One by mouth once daily 9)  Omeprazole 20 Mg Cpdr (Omeprazole) .... One by mouth once daily 30 mins before evening meal   Patient Instructions: 1)  Please schedule a follow-up appointment in 2 weeks. 2)  Call our office if your symptoms do not  improve or gets worse.   Prescriptions: METFORMIN HCL 500 MG XR24H-TAB (METFORMIN HCL) 1 tab in am and 1 tabs in pm  #90 x 3   Entered and Authorized by:   D. Thomos Lemons DO   Signed by:   D. Thomos Lemons DO on 06/16/2008   Method used:   Electronically to        [facility]* (retail)       [address]       [address] Shipping/mailing       [place], [place]  [address]       Ph: [phone]  Fax: [phone]   RxID:   [id number] OMEPRAZOLE 20 MG CPDR (OMEPRAZOLE) one by mouth once daily 30 mins before evening meal  #30 x 2   Entered and Authorized by:   D. Thomos Lemons DO   Signed by:   D. Thomos Lemons DO on 06/16/2008   Method used:   Electronically to        [facility]* (retail)       [address]       [address] [place] [address]mailing       [place], [place]  [address]       Ph: [phone]       Fax: [phone]   RxID:   [id number]    Preventive Care Screening  Mammogram:    Date:  02/21/2008    Results:  normal   Bone Density:    Date:  01/27/2008    Results:  normal std dev   Current Allergies (reviewed today): ! ASA

## 2010-05-24 NOTE — Assessment & Plan Note (Signed)
Summary: rov for osa   Primary Provider/Referring Provider:  Dondra Spry DO  CC:  Pt is here for a yearly f/u appt.  Pt states she is using her cpap machine every night.  Approx 8 hours per night.  Pt denied any problems with mask or pressure.  Pt denied any new complaints.  .  History of Present Illness: the pt comes in today for f/u of her osa.  She has been wearing the device compliantly, and feels that it is helping her sleep and daytime alertness.  She has no issues with mask fit or pressure.  Her only complaint is that of "dreaming too much".  She is trying to get her weight down, and has lost 6pounds since her last visit here.  Medications Prior to Update: 1)  Potassium Chloride Crys Cr 20 Meq  Tbcr (Potassium Chloride Crys Cr) .... Take 1 Tablet By Mouth Once A Day 2)  Estrace 1 Mg  Tabs (Estradiol) .... Take 1 Tablet By Mouth Once A Day 3)  Allergy/congestion Relief 10-240 Mg  Tb24 (Loratadine-Pseudoephedrine) .... Take 1 Tablet By Mouth Once A Day 4)  Benicar Hct 40-12.5 Mg  Tabs (Olmesartan Medoxomil-Hctz) .... One By Mouth Once Daily 5)  Metformin Hcl 500 Mg Xr24h-Tab (Metformin Hcl) .Marland Kitchen.. 1 Tab in Am and 2 Tabs in Pm 6)  Triamcinolone Acetonide 0.1 %  Crea (Triamcinolone Acetonide) .... Apply Bid 7)  Bystolic 5 Mg  Tabs (Nebivolol Hcl) .... Take 1 Tablet By Mouth Once A Day 8)  Levothyroxine Sodium 112 Mcg Tabs (Levothyroxine Sodium) .... One By Mouth Once Daily 9)  Nexium 40 Mg Cpdr (Esomeprazole Magnesium) .... One By Mouth Two Times A Day  Allergies (verified): 1)  ! Asa  Review of Systems      See HPI  Vital Signs:  Patient profile:   51 year old female Weight:      221.13 pounds O2 Sat:      98 % Temp:     98.3 degrees F oral Pulse rate:   65 / minute BP sitting:   118 / 72  (left arm) Cuff size:   regular  Vitals Entered By: Arman Filter LPN (August 10, 2008 8:53 AM)  O2 Sat on room air at rest %:  98 CC: Pt is here for a yearly f/u appt.  Pt states she is  using her cpap machine every night.  Approx 8 hours per night.  Pt denied any problems with mask or pressure.  Pt denied any new complaints.   Comments Medications reviewed with patient Arman Filter LPN  August 10, 2008 8:53 AM    Physical Exam  General:  overweight female in nad Nose:  no skin breakdown or pressure necrosis from the cpap mask   Impression & Recommendations:  Problem # 1:  SLEEP APNEA, OBSTRUCTIVE (ICD-327.23) the pt is doing well with her cpap, and feels rested during day.  She is having no issues with the device.  I have asked her to keep up with the mask changes, and to work hard on weight loss.  She will follow up with me in one year.  Other Orders: Est. Patient Level II ([address])  Patient Instructions: 1)  Please schedule a follow-up appointment in 1 year. 2)  continue to work on weight loss

## 2010-05-24 NOTE — Assessment & Plan Note (Signed)
Summary: elevated BP at work / tf,cma   Vital Signs:  Patient profile:   50 year old female Height:      64 inches Weight:      223 pounds BMI:     38.42 O2 Sat:      99 % on Room air Temp:     98.4 degrees F oral Pulse rate:   110 / minute BP sitting:   130 / 84  (left arm) Cuff size:   large  Vitals Entered By: Payton Spark CMA (February 18, 2010 2:33 PM)  O2 Flow:  Room air CC: F/U elevated BP.    Primary Care Provider:  DThomos Lemons DO  CC:  F/U elevated BP. Marland Kitchen  History of Present Illness: 50 y/o AA female notes elevated BP SBP 160's during health screening at work she has been taking some decongestants OTC  she has chronic dry cough her symptoms are worse at night    Allergies: 1)  ! Asa  Past History:  Past Medical History: GERD Hypertension  Hypothyroidism    Chronic rhinosinusitis Obstructive Sleep Apea  Obesity Depression/Anxiety Colonic polyps, hx of Hyperlipidemia Uterine Fibroids Hoarseness     Endo - Dr. Lucianne Muss ENT - Dr. Annalee Genta GI - Dr. Matthias Hughs Pulm - Dr. Shelle Iron Rheum - Dr. 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   Lungs:  normal respiratory effort and normal breath sounds.   Heart:  normal rate, regular rhythm, and no gallop.   Extremities:  trace left pedal edema and trace right pedal edema.   Psych:  normally interactive and good eye contact.     Impression & Recommendations:  Problem # 1:  HYPERTENSION (ICD-401.9) Assessment Improved BP exacerbated by decongestant use.   however, pt will likely need combination therapy for adequate control.  continue losartan hctz  Her updated medication list for  this problem includes:    Losartan Potassium-hctz 50-12.5 Mg Tabs (Losartan potassium-hctz) ..... One by mouth once daily  BP today: 130/84 Prior BP: 134/100 (01/14/2010)  Labs Reviewed: K+: 4.1 (10/11/2009) Creat: : 0.8 (10/11/2009)   Chol: 155 (08/09/2009)   HDL: 47.00 (08/09/2009)   LDL: 86 (08/09/2009)   TG: 109.0 (08/09/2009)  Problem # 2:  RHINITIS, CHRONIC (ICD-472.0) pt stil unable to stop using otc afrin pt advised to use both flonase and astelin nose spray  stop afrin  Problem # 3:  GERD (ICD-530.81) Pt having intermittent regurgitation.  she stopped PPI.  restart nexium GERD may also be contributing to chronic cough Her updated medication list for this problem includes:    Nexium 40 Mg Cpdr (Esomeprazole magnesium) ..... One by mouth once daily 30 mins before am meal  Orders: Radiology Referral (Radiology)  Complete Medication List: 1)  Potassium Chloride Crys Cr 20 Meq Tbcr (Potassium chloride crys cr) .... Take 1 tablet by mouth once a day 2)  Allergy/congestion Relief 10-240 Mg Tb24 (Loratadine-pseudoephedrine) .... Take 1 tablet by mouth once a day 3)  Metformin Hcl 500 Mg Xr24h-tab (Metformin hcl) .Marland Kitchen.. 1 tab in am and 2 tabs in pm 4)  Triamcinolone Acetonide 0.1 % Crea (Triamcinolone acetonide) .... Apply bid 5)  Levothyroxine Sodium  75 Mcg Tabs (Levothyroxine sodium) .... One by mouth once daily 6)  Selenium 200 Mcg Tabs (Selenium) .Marland Kitchen.. 1 by mouth qd 7)  Aspir-low 81 Mg Tbec (Aspirin) .Marland Kitchen.. 1 by mouth qd 8)  Benadryl 25 Mg Caps (Diphenhydramine hcl) .... By mouth at bedtime prn 9)  Losartan Potassium-hctz 50-12.5 Mg Tabs (Losartan potassium-hctz) .... One by mouth once daily 10)  Clotrimazole-betamethasone 1-0.05 % Crea (Clotrimazole-betamethasone) .... Apply two times a day 11)  Azelastine Hcl 137 Mcg/spray Soln (Azelastine hcl) .... 2 sprays two times a day 12)  Veramyst 27.5 Mcg/spray Susp (Fluticasone furoate) .... 2 sprays each nostril once daily 13)   Hydrocod Polst-chlorphen Polst 10-8 Mg/35ml Lqcr (Hydrocod polst-chlorphen polst) .... 5 ml by mouth two times a day as needed for cough 14)  Nexium 40 Mg Cpdr (Esomeprazole magnesium) .... One by mouth once daily 30 mins before am meal  Patient Instructions: 1)  Keep your next follow up appointment Prescriptions: LOSARTAN POTASSIUM-HCTZ 50-12.5 MG TABS (LOSARTAN POTASSIUM-HCTZ) one by mouth once daily  #30 x 3   Entered and Authorized by:   D. Thomos Lemons DO   Signed by:   D. Thomos Lemons DO on 02/18/2010   Method used:   Electronically to        [facility]* (retail)       [address]       [address] Shipping/mailing       [place], [place]  [address]       Ph: [phone]       Fax: [phone]   RxID:   [id number] NEXIUM 40 MG CPDR (ESOMEPRAZOLE MAGNESIUM) one by mouth once daily 30 mins before AM meal  #90 x 1   Entered and Authorized by:   D. Thomos Lemons DO   Signed by:   D. Thomos Lemons DO on 02/18/2010   Method used:   Electronically to        [facility]* (retail)       [address]       [address] Shipping/mailing       [place], [place]  [address]       Ph: [phone]       Fax: [phone]   RxID:   [id number]    Orders Added: 1)  Radiology Referral [Radiology] 2)  Est. Patient Level III [phone]

## 2010-05-24 NOTE — Progress Notes (Signed)
Summary: Klor-Con refill  Phone Note Refill Request Message from:  Fax from Pharmacy on March 09, 2010 3:53 PM  Refills Requested: Medication #1:  Klor-Con M20 Tablet Take 1 tablet by mouth once a day   Dosage confirmed as above?Dosage Confirmed   Brand Name Necessary? No   Supply Requested: 3 months   Last Refilled: 08/06/2008  Method Requested: Electronic Initial call taken by: Lannette Donath,  March 09, 2010 3:53 PM    Prescriptions: POTASSIUM CHLORIDE CRYS CR 20 MEQ  TBCR (POTASSIUM CHLORIDE CRYS CR) Take 1 tablet by mouth once a day  #90 x 2   Entered by:   Glendell Docker CMA   Authorized by:   D. Thomos Lemons DO   Signed by:   Glendell Docker CMA on 03/10/2010   Method used:   Electronically to        [facility]* (retail)       [address]       [address] [place] [address]mailing       [place], [place]  [address]       Ph: [phone]       Fax: [phone]   RxID:   [id number]

## 2010-05-24 NOTE — Assessment & Plan Note (Signed)
Summary: 2 month follow up/mhf   Vital Signs:  Patient profile:   50 year old female Weight:      220 pounds BMI:     37.90 O2 Sat:      100 % on Room air Temp:     98.1 degrees F oral Pulse rate:   79 / minute Pulse rhythm:   regular Resp:     16 per minute BP sitting:   124 / 80  (right arm) Cuff size:   large  Vitals Entered By: Glendell Docker CMA (October 18, 2009 10:49 AM)  O2 Flow:  Room air CC: Rm 3- 2 Month Follow up  Is Patient Diabetic? No   Primary Care Provider:  DThomos Lemons DO  CC:  Rm 3- 2 Month Follow up .  History of Present Illness:  Hypertension Follow-Up      This is a 50 year old woman who presents for Hypertension follow-up.  The patient denies lightheadedness and edema.  The patient denies the following associated symptoms: chest pain and chest pressure.  Compliance with medications (by patient report) has been near 100%.    gets occ headache  allergies causing dry cough for the past 3 weeks  Started Vitamin E once a day, and stopped taking the Estrace tabs  Allergies: 1)  ! Asa  Past History:  Past Medical History: GERD Hypertension  Hypothyroidism   Chronic rhinosinusitis Obstructive Sleep Apea  Obesity Depression/Anxiety Colonic polyps, hx of Hyperlipidemia Uterine Fibroids Hoarseness     Endo - Dr. Lucianne Muss ENT - Dr. Annalee Genta GI - Dr. Matthias Hughs Pulm - Dr. Shelle Iron Rheum - Dr. Kellie Simmering  Past Surgical History: Cholecystectomy  Hysterectomy      Family History: Father deceased - renal cancer, alcoholism, colon cancer, htn, hyperlipidemia Grandmother with diabetes       Physical Exam  General:  alert and overweight-appearing.   Neck:  supple and no masses.   Lungs:  normal respiratory effort and normal breath sounds.   Heart:  normal rate, regular rhythm, and no gallop.   Extremities:  trace left pedal edema and trace right pedal edema.     Impression & Recommendations:  Problem # 1:  HYPERTENSION  (ICD-401.9) Assessment Improved BP improved with hctz alone.  Maintain current medication regimen.  Her updated medication list for this problem includes:    Hydrochlorothiazide 12.5 Mg Tabs (Hydrochlorothiazide) ..... One by mouth once daily  BP today: 124/80 Prior BP: 150/80 (08/20/2009)  Labs Reviewed: K+: 4.1 (10/11/2009) Creat: : 0.8 (10/11/2009)   Chol: 155 (08/09/2009)   HDL: 47.00 (08/09/2009)   LDL: 86 (08/09/2009)   TG: 109.0 (08/09/2009)  Problem # 2:  HYPOTHYROIDISM (ICD-244.9) reduce thyroid replacement  Her updated medication list for this problem includes:    Levothyroxine Sodium 75 Mcg Tabs (Levothyroxine sodium) ..... One by mouth once daily  Labs Reviewed: TSH: 0.07 (10/11/2009)    HgBA1c: 6.0 (08/09/2009) Chol: 155 (08/09/2009)   HDL: 47.00 (08/09/2009)   LDL: 86 (08/09/2009)   TG: 109.0 (08/09/2009)  Complete Medication List: 1)  Potassium Chloride Crys Cr 20 Meq Tbcr (Potassium chloride crys cr) .... Take 1 tablet by mouth once a day 2)  Estrace 1 Mg Tabs (Estradiol) .... Take 1 tablet by mouth two times a day 3)  Allergy/congestion Relief 10-240 Mg Tb24 (Loratadine-pseudoephedrine) .... Take 1 tablet by mouth once a day 4)  Metformin Hcl 500 Mg Xr24h-tab (Metformin hcl) .Marland Kitchen.. 1 tab in am and 2 tabs in pm 5)  Triamcinolone Acetonide 0.1 % Crea (Triamcinolone acetonide) .... Apply bid 6)  Levothyroxine Sodium 75 Mcg Tabs (Levothyroxine sodium) .... One by mouth once daily 7)  Fish Oil 1000 Mg Caps (Omega-3 fatty acids) .Marland Kitchen.. 1 by mouth bid 8)  Green Tea 400 Mg Caps (green Tea (camillia Sinensis))  .Marland Kitchen.. 1 by mouth qd 9)  Ultra Minerals  .Marland KitchenMarland Kitchen. 1 by mouth bid 10)  Garlic  .Marland Kitchen.. 1 by mouth bid 11)  Selenium 200 Mcg Tabs (Selenium) .Marland Kitchen.. 1 by mouth qd 12)  Vitamin D 1,000 Iu  .Marland Kitchen.. 1 by mouth qd 13)  Biotin 5,066mcg  .Marland KitchenMarland Kitchen. 1 by mouth bid 14)  Super B-complex  .Marland KitchenMarland Kitchen. 1 by mouth bid 15)  Aspir-low 81 Mg Tbec (Aspirin) .Marland Kitchen.. 1 by mouth qd 16)  Benadryl 25 Mg Caps  (Diphenhydramine hcl) .... By mouth at bedtime prn 17)  Hydrochlorothiazide 12.5 Mg Tabs (Hydrochlorothiazide) .... One by mouth once daily 18)  Clotrimazole-betamethasone 1-0.05 % Crea (Clotrimazole-betamethasone) .... Apply two times a day  Patient Instructions: 1)  Please schedule a follow-up appointment in 6 months. 2)  BMP prior to visit, ICD-9:  401.9 3)  HbgA1C prior to visit, ICD-9:  790.29 4)  Above blood work before 6 month visit 5)  Schedule TSH in 2 months 244.9 6)  Please return for lab work one (1) week before your next appointment.  Prescriptions: CLOTRIMAZOLE-BETAMETHASONE 1-0.05 % CREA (CLOTRIMAZOLE-BETAMETHASONE) apply two times a day  #30 grams x 1   Entered and Authorized by:   D. Thomos Lemons DO   Signed by:   D. Thomos Lemons DO on 10/18/2009   Method used:   Electronically to        [facility]* (retail)       [address]       [address] [place] [address]mailing       [place], [place]  [address]       Ph: [phone]       Fax: [phone]   RxID:   [id number]   Current Allergies (reviewed today): ! ASA   Preventive Care Screening  Mammogram:    Date:  02/09/2009    Results:  normal   Pap Smear:    Date:  02/09/2009    Results:  normal

## 2010-05-24 NOTE — Assessment & Plan Note (Signed)
Summary: 6 WK ROA/NML   Vital Signs:  Patient Profile:   50 Years Old Female Height:     64 inches Weight:      224.25 pounds Temp:     98.3 degrees F oral Pulse rate:   85 / minute Pulse rhythm:   regular BP sitting:   147 / 86  (right arm)  Vitals Entered By: Rock Nephew CMA (July 22, 2007 4:05 PM)                 History of Present Illness:  Hypertension Follow-Up      This is a 50 year old woman who presents for Hypertension follow-up.  The patient reports fatigue, but denies edema.  The patient denies the following associated symptoms: chest pain.  Compliance with medications (by patient report) has been near 100%.  The patient reports that dietary compliance has been fair.    We reviewed her blood work.  Her blood sugar and A1c are slightly elevated.     Current Allergies: ! ASA  Past Medical History:    Reviewed history from 06/06/2007 and no changes required:       GERD       Hypertension       Hypothyroidism       Chronic rhinosinusitis       Obstructive Sleep Apea       Obesity       Depression/Anxiety       Colonic polyps, hx of       Hyperlipidemia       Uterine Fibroids       Hoarseness                     Endo - Dr. Lucianne Muss       ENT - Dr. Annalee Genta       GI - Dr. Farrel Gobble - Dr. 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   Head:     normocephalic and atraumatic.   Eyes:     vision grossly intact, pupils equal, pupils round, and pupils reactive to light.   Neck:     thick neck. no masses.   supple and no carotid bruits.   Lungs:     normal respiratory effort and normal breath sounds.   Heart:     normal rate, regular rhythm, no murmur, and no gallop.   Abdomen:     soft and non-tender.   Extremities:     No lower extremity edema  Skin:     dry, thickened skin over knuckles Psych:     good eye contact and slightly anxious.      Impression & Recommendations:  Problem # 1:  MYALGIA (ICD-729.1) Pt reports history of elevated CPK.   Her previous physicians attributed to hypothyroidism.  She complains of  persistent fatigue (esp of her shoulder muscles).   Repeat CPK.  Consider rheumatology referral.  Problem # 2:  HYPERTENSION (ICD-401.9) BP is suboptimal.   Increase Benicar to 40/12.5.   Pt states cough resolved after stopping amlodipine. Her updated medication list for this problem includes:    Benicar Hct 40-12.5 Mg Tabs (Olmesartan medoxomil-hctz) ..... One by mouth once daily  BP today: 147/86 Prior BP: 141/87 (06/06/2007)  Labs Reviewed: Creat: 1.0 (07/05/2007) Chol: 197 (07/05/2007)   HDL: 32.5 (07/05/2007)   LDL: 126 (07/05/2007)   TG: 192 (07/05/2007)   Problem # 3:  OTHER ABNORMAL GLUCOSE (ICD-790.29) We discussed dietary changes and need for regular exercise.  Start metformin two times a day.  Her updated medication list for this problem includes:    Glucophage Xr 500 Mg Tb24 (Metformin hcl) ..... One by mouth two times a day  Labs Reviewed: HgBA1c: 6.1 (07/05/2007)   Creat: 1.0 (07/05/2007)      Problem # 4:  HYPOTHYROIDISM (ICD-244.9) Maintain current medication regimen.  Her updated medication list for this problem includes:    Armour Thyroid 60 Mg Tabs (Thyroid) .Marland Kitchen... Take 1 tablet by mouth once a day    Armour Thyroid 15 Mg Tabs (Thyroid) .Marland Kitchen... 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 Apply bid   Complete Medication List: 1)  Armour Thyroid 60 Mg Tabs (Thyroid) .... Take 1 tablet by mouth once a day 2)  Armour Thyroid 15 Mg Tabs (Thyroid) .... Take 1 tablet by mouth once a day 3)  Potassium Chloride Crys Cr 20 Meq Tbcr (Potassium chloride crys cr) .... Take 1 tablet by mouth once a day 4)  Fluticasone Propionate 50 Mcg/act Susp (Fluticasone propionate) 5)  Evamist 1.53 Mg/spray Soln (Estradiol) 6)  Afrin Nasal Spray 0.05 % Soln (Oxymetazoline hcl) 7)  Allergy/congestion Relief 10-240 Mg Tb24 (Loratadine-pseudoephedrine) .... Take 1 tablet by mouth once a day 8)  Nexium 40 Mg Cpdr (Esomeprazole magnesium) .... One by mouth once daily 9)  Benicar Hct 40-12.5 Mg Tabs (Olmesartan medoxomil-hctz) .... One by mouth once daily 10)  Glucophage Xr 500 Mg Tb24 (Metformin hcl) .... One by mouth two times a day 11)  Triamcinolone Acetonide 0.1 % Crea (Triamcinolone acetonide) .... Apply bid   Patient Instructions: 1)  Please schedule a follow-up appointment in 6 weeks. 2)  BMP prior to visit, ICD-9:  401.9 3)  CPK:  729.1 4)  Please return for lab work one (1) week before your next appointment.     Prescriptions: TRIAMCINOLONE ACETONIDE 0.1 %  CREA (TRIAMCINOLONE ACETONIDE) apply bid  #60 gram x 1   Entered and Authorized by:   D. Thomos Lemons DO   Signed by:   D. Thomos Lemons DO on 07/22/2007   Method used:   Electronically sent to ...       [facility] [address]*       [address]       [place], [place]  [address]       Ph: [phone]       Fax: [phone]   RxID:  [id number] GLUCOPHAGE XR 500 MG  TB24 (METFORMIN HCL) one by mouth two times a day  #60 x 5   Entered and Authorized by:   D. Thomos Lemons DO   Signed by:   D. Thomos Lemons DO on 07/22/2007   Method used:    Electronically sent to ...       [facility] [address]*       [address]       [place], [place]  [address]       Ph: [phone]       Fax: [phone]   RxID:   [id number] BENICAR HCT 40-12.5 MG  TABS (OLMESARTAN MEDOXOMIL-HCTZ) one by mouth once daily  #30 x 5   Entered and Authorized by:   D. Thomos Lemons DO   Signed by:   D. Thomos Lemons DO on 07/22/2007   Method used:   Electronically sent to ...       [facility] [address]*       [address]       [place], [place]  [address]       Ph: [phone]       Fax: [phone]   RxID:   [id number] NEXIUM 40 MG  CPDR (ESOMEPRAZOLE MAGNESIUM) one by mouth once daily  #30 x 5   Entered and Authorized by:   D. Thomos Lemons DO   Signed by:   D. Thomos Lemons DO on 07/22/2007   Method used:   Electronically sent to ...       [facility] [address]*       [address]       [place], [place]  [address]       Ph: [phone]       Fax: [phone]   RxID:   [id number]  ]

## 2010-05-24 NOTE — Progress Notes (Signed)
Summary: Medication Back Order  Phone Note Refill Request Message from:  Fax from Pharmacy on December 10, 2007 2:10 PM  Refills Requested: Medication #1:  ARMOUR THYROID 60 MG  TABS Take 1 tablet by mouth once a day   Dosage confirmed as above?Dosage Confirmed   Last Refilled: 11/28/2007   Notes: Back Order with unknown release date  Medication #2:  ARMOUR THYROID 15 MG  TABS Take 1 tablet by mouth once a day   Dosage confirmed as above?Dosage Confirmed   Last Refilled: 11/28/2007   Notes: back order with unknown release date  Method Requested: Fax to Local Pharmacy Next Appointment Scheduled: 02/11/08 Initial call taken by: Glendell Docker CMA,  December 10, 2007 2:12 PM  Follow-up for Phone Call        have pt call to several pharmacys in the area to see if they have in stock Follow-up by: D. Thomos Lemons DO,  December 10, 2007 3:07 PM  Additional Follow-up for Phone Call Additional follow up Details #1::        Left voice mesage at  [phone] to call regarding medication. Need to check to see if there is a pharmacy preference. Current pharmacy does not have medication in stock and we need to change where the medication if filled at    Additional Follow-up for Phone Call Additional follow up Details #2::    spoke with patient she requested new rx be sent to [facility] Follow-up by: Glendell Docker CMA,  December 11, 2007 10:53 AM  New/Updated Medications: ARMOUR THYROID 60 MG  TABS (THYROID) Take 1 tablet by mouth once a day ARMOUR THYROID 15 MG  TABS (THYROID) Take 1 tablet by mouth once a day   Prescriptions: ARMOUR THYROID 15 MG  TABS (THYROID) Take 1 tablet by mouth once a day  #30 x 5   Entered by:   Glendell Docker CMA   Authorized by:   D. Thomos Lemons DO   Signed by:   Glendell Docker CMA on 12/11/2007   Method used:   Electronically sent to ...       [facility]*       [address]       [address] Shipping/mailing  [place], [place]  [address]       Ph: [phone]       Fax: [phone]   RxID:   [id number] ARMOUR THYROID 60 MG  TABS (THYROID) Take 1 tablet by mouth once a day  #30 x 5   Entered by:   Glendell Docker CMA   Authorized by:   D. Thomos Lemons DO   Signed by:   Glendell Docker CMA on 12/11/2007   Method used:   Electronically sent to ...       [facility]*       [address]       [address] Shipping/mailing       [place], [place]  [address]       Ph: [phone]       Fax: [phone]   RxID:   [id number]

## 2010-05-24 NOTE — Assessment & Plan Note (Signed)
Summary: NEW/[facility] /[facility]   Vital Signs:  Patient Profile:   50 Years Old Female Height:     64 inches Weight:      218.50 pounds BMI:     37.64 Temp:     99.0 degrees F oral Pulse rate:   96 / minute BP sitting:   141 / 87  (right arm)  Vitals Entered By: Glendell Docker (June 06, 2007 1:30 PM)                 Chief Complaint:  NEW PATIENT.  History of Present Illness: 50 year old [place] American female here to establish primary care.  She reports recent evaluation by ENT (Dr. Annalee Genta) for hoarseness.  She has been struggling to use her CPAP.  There is question of reflux exacerbating her hoarseness.  She was on Nexium but switched to omeprazole secondary to cost issues.  She has experienced refractory heartburn on omeprazole.  She reports severe nocturnal symptoms.    Current Allergies (reviewed today): ! ASA  Past Medical History:    GERD    Hypertension    Hypothyroidism    Chronic rhinosinusitis    Obstructive Sleep Apea    Obesity    Depression/Anxiety    Colonic polyps, hx of    Hyperlipidemia    Uterine Fibroids    Hoarseness            Endo - Dr. Lucianne Muss    ENT - Dr. Annalee Genta    GI - Dr. Farrel Gobble - Dr. 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   Physical Exam  General:     overweight-appearing.   Head:     normocephalic and atraumatic.   Eyes:     vision grossly intact, pupils equal, pupils round, and pupils reactive to light.   Ears:     R ear normal and L ear normal.   Mouth:     Oral mucosa and oropharynx  without lesions or exudates.  Neck:     thick neck. no masses.   Lungs:     normal respiratory effort and normal breath sounds.   Heart:     normal rate, regular rhythm, no murmur, and no gallop.   Abdomen:     protuberant, soft, non-tender, no hepatomegaly, and no splenomegaly.   Extremities:     No lower extremity edema  Neurologic:     No cranial nerve deficits noted. Station and gait are normal.  Sensory, motor and coordinative functions appear intact. Psych:     normally interactive.      Impression & Recommendations:  Problem # 1:  GERD (ICD-530.81) Patient having severe reflux with nocturnal symptoms.  She has tried generic omeprazole but is having break through symptoms.  I strongly suspect GERD is trigger for her hoarseness.  Trial of Zegerid.  Her updated medication list for this problem includes:    Zegerid 40-1100 Mg Caps (Omeprazole-sodium bicarbonate) ..... One by mouth  qhs  The following medications were removed from the medication list:    Nexium 40 Mg Cpdr (Esomeprazole magnesium) .Marland Kitchen... Take 1 tablet by mouth two times a day  Her updated medication list for this problem includes:    Zegerid 40-1100 Mg Caps (Omeprazole-sodium bicarbonate) ..... One by mouth qhs   Problem # 2:  HYPERTENSION (ICD-401.9) Patient reports norvasc caused cough.  CCB may relax lower esophageal smooth muscle tone.  DC HCTZ and Norvasc.  Replace with Benicar/Hctz 20/12.5 by mouth once daily.  The following medications were removed from the medication list:    Hydrochlorothiazide 25 Mg Tabs (Hydrochlorothiazide) .Marland Kitchen... Take 1 tablet by mouth once a day    Norvasc 10 Mg Tabs (Amlodipine besylate) .Marland Kitchen... Take 1 tablet by mouth once a day  Her updated medication list for this problem includes:    Benicar Hct 20-12.5 Mg Tabs (Olmesartan medoxomil-hctz) ..... One by mouth once daily  BP today: 141/87   Problem # 3:  HYPOTHYROIDISM (ICD-244.9) She is followed by Dr. Lucianne Muss.  Her updated  medication list for this problem includes:    Armour Thyroid 60 Mg Tabs (Thyroid) .Marland Kitchen... Take 1 tablet by mouth once a day    Armour Thyroid 15 Mg Tabs (Thyroid) .Marland Kitchen... Take 1 tablet by mouth once a day   Problem # 4:  ADJ DISORDER WITH MIXED ANXIETY & DEPRESSED MOOD (ICD-309.28) Patient started on Pristiq by Dr. Lucianne Muss in Aug 2008.  She abruptly stopped on her own 4 wks ago.  She reports "dull" sensation which may be withdrawal symptoms.  I strongly urged f/u with Dr. Lucianne Muss.   Problem # 5:  SLEEP APNEA, OBSTRUCTIVE (ICD-327.23) She is having trouble with CPAP.  She is attributing her difficulty due to sinus issues.   She will f/u with Dr.  Shelle Iron.  Complete Medication List: 1)  Armour Thyroid 60 Mg Tabs (Thyroid) .... Take 1 tablet by mouth once a day 2)  Armour Thyroid 15 Mg Tabs (Thyroid) .... Take 1 tablet by mouth once a day 3)  Potassium Chloride Crys Cr 20 Meq Tbcr (Potassium chloride crys cr) .... Take 1 tablet by mouth once a day 4)  Fluticasone Propionate 50 Mcg/act Susp (Fluticasone propionate) 5)  Evamist 1.53 Mg/spray Soln (Estradiol) 6)  Afrin Nasal Spray 0.05 % Soln (Oxymetazoline hcl) 7)  Allergy/congestion Relief 10-240 Mg Tb24 (Loratadine-pseudoephedrine) .... Take 1 tablet by mouth once a day 8)  Zegerid 40-1100 Mg Caps (Omeprazole-sodium bicarbonate) .... One by mouth qhs 9)  Benicar Hct 20-12.5 Mg Tabs (Olmesartan medoxomil-hctz) .... One by mouth once daily   Patient Instructions: 1)  Please schedule a follow-up appointment in 6 weeks. 2)  BMP prior to visit, ICD-9:  401.9 3)  Hepatic Panel prior to visit, ICD-9: 401.9 4)  Lipid Panel prior to visit, ICD-9: 401.9 5)  TSH prior to visit, ICD-9:  244.90 6)  CBC w/ Diff prior to visit, ICD-9: V70 7)  HbgA1C prior to visit,  790.29 8)  Please return for lab work one (1) week before your next appointment.  (Fasting)    Prescriptions: BENICAR HCT 20-12.5 MG  TABS (OLMESARTAN MEDOXOMIL-HCTZ) one by mouth once daily   #30 x 5   Entered and Authorized by:   D. Thomos Lemons DO   Signed by:   D. Thomos Lemons DO on 06/06/2007   Method used:   Historical   RxID:   [id number] ZEGERID 40-1100 MG  CAPS (OMEPRAZOLE-SODIUM BICARBONATE) one by mouth qhs  #30 x 5  Entered and Authorized by:   D. Thomos Lemons DO   Signed by:   D. Thomos Lemons DO on 06/06/2007   Method used:   Electronically sent to ...       [facility] [address]*       [address]       [place], [place]  [address]       Ph: [phone]       Fax: [phone]   RxID:   [id number]  ]

## 2010-05-24 NOTE — Assessment & Plan Note (Signed)
Summary: 2 WEEKS ROV-CH   Vital Signs:  Patient profile:   50 year old female Height:      64 inches Weight:      218 pounds BMI:     37.55 Temp:     98.3 degrees F oral Pulse rate:   88 / minute Pulse rhythm:   regular Resp:     18 per minute BP sitting:   104 / 64  (right arm) Cuff size:   large  Vitals Entered By: Glendell Docker CMA (June 30, 2008 2:53 PM)  Primary Care Provider:  Dondra Spry DO  CC:  Heartburn.  History of Present Illness: Heartburn      This is a 50 year old woman who presents with Heartburn.  The patient reports sour taste in mouth, epigastric pain, and chest pain, but denies trouble swallowing and weight loss.  The patient denies the following alarm features: dysphagia.  Symptoms are worse with spicy foods and citrus.  Prior evaluation has included EGD.  Patient reports persistent symptoms besides daily omeprazole.  Patient noted to have EGD in 2004.  It was negative for gastritis.  Patient has laryngeal inflammation presumed secondary to reflux.  Chronic back pain-improved she defers referral to physical therapy.  Patient also has IBS-type symptoms including intermittent crampy abdominal pain, bloating, and constipation.  Preventive Screening-Counseling & Management     Alcohol drinks/day: 0     Smoking Status: quit > 6 months     Year Quit: 2005     Caffeine use/day: no     Does Patient Exercise: no  Allergies: 1)  ! Asa  Past History  Past Medical History: GERD Hypertension Hypothyroidism Chronic rhinosinusitis Obstructive Sleep Apea Obesity Depression/Anxiety Colonic polyps, hx of Hyperlipidemia Uterine Fibroids Hoarseness   Endo - Dr. Lucianne Muss ENT - Dr. Annalee Genta GI - Dr. Farrel Gobble - Dr. Shelle Iron Rheum - Dr. 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   Neck:  supple and no masses.   Lungs:  normal respiratory effort and normal breath sounds.   Heart:  normal rate, regular rhythm, and no gallop.   Abdomen:  obese, soft - mild diffuse tenderness. Extremities:  No lower extremity edema  Psych:  normally interactive, good eye contact, and slightly anxious.     Impression & Recommendations:  Problem # 1:  GERD (ICD-55.32) 50 year old African-American female with persistent heartburn symptoms despite daily omeprazole.  Increase Nexium to 40 mg twice daily.  Previous EGD was performed by Dr. Jeannetta Ellis in 2004.  It was negative for gastritis.  It noted laryngeal edema/inflammation presumed secondary to GERD.  If persistent symptoms despite b.i.d. Nexium, consider GI referral.  Her updated medication list for this problem includes:    Nexium 40 Mg Cpdr (Esomeprazole magnesium) ..... One by mouth two times a day  Problem # 2:  BACK PAIN, LUMBAR, CHRONIC (ICD-724.2) Assessment: Improved Improved.  Pt defers referral to physical therapy.  Problem # 3:  HYPERTENSION (ICD-401.9) Stable.   Maintain current medication regimen.  Her  updated medication list for this problem includes:    Benicar Hct 40-12.5 Mg Tabs (Olmesartan medoxomil-hctz) ..... One by mouth once daily    Bystolic 5 Mg Tabs (Nebivolol hcl) .Marland Kitchen... Take 1 tablet by mouth once a day  BP today: 104/64 Prior BP: 140/80 (06/16/2008)  Labs Reviewed: Creat: 0.9 (06/09/2008) Chol: 184 (06/09/2008)   HDL: 38.6 (06/09/2008)   LDL: 108 (06/09/2008)   TG: 188 (06/09/2008)  Complete Medication List: 1)  Potassium Chloride Crys Cr 20 Meq Tbcr (Potassium chloride crys cr)  .... Take 1 tablet by mouth once a day 2)  Estrace 1 Mg Tabs (Estradiol) .... Take 1 tablet by mouth once a day 3)  Allergy/congestion Relief 10-240 Mg Tb24 (Loratadine-pseudoephedrine) .... Take 1 tablet by mouth once a day 4)  Benicar Hct 40-12.5 Mg Tabs (Olmesartan medoxomil-hctz) .... One by mouth once daily 5)  Metformin Hcl 500 Mg Xr24h-tab (Metformin hcl) .Marland Kitchen.. 1 tab in am and 2 tabs in pm 6)  Triamcinolone Acetonide 0.1 % Crea (Triamcinolone acetonide) .... Apply bid 7)  Bystolic 5 Mg Tabs (Nebivolol hcl) .... Take 1 tablet by mouth once a day 8)  Levothyroxine Sodium 112 Mcg Tabs (Levothyroxine sodium) .... One by mouth once daily 9)  Nexium 40 Mg Cpdr (Esomeprazole magnesium) .... One by mouth two times a day  Patient Instructions: 1)  Please schedule a follow-up appointment in 2 months. 2)  TSH prior to visit, ICD-9: 244.90 3)  Avoid foods high in acid (tomatoes, citrus juices, spicy foods). Avoid eating within two hours of lying down or before exercising. Do not over eat; try smaller more frequent meals. Elevate head of bed twelve inches when sleeping. 4)  The medication list was reviewed and reconciled.  All changed / newly prescribed medications were explained.  A complete medication list was provided to the patient / caregiver.  Prescriptions: NEXIUM 40 MG CPDR (ESOMEPRAZOLE MAGNESIUM) one by mouth two times a day  #60 x 5   Entered and Authorized by:   D. Thomos Lemons DO   Signed by:   D. Thomos Lemons DO on 06/30/2008   Method used:   Electronically to        [facility]* (retail)       [address]       [address]       [place], [place]  [address]       Ph: [phone]       Fax: [phone]   RxID:   [id number] METFORMIN HCL 500 MG XR24H-TAB (METFORMIN HCL) 1 tab in am and 2 tabs in pm  #90 x 5   Entered and Authorized by:   D. Thomos Lemons DO   Signed by:   D. Thomos Lemons DO on 06/30/2008   Method used:   Electronically to         [facility]* (retail)       [address]       [address]mail[address]       [place], [place]  [address]       Ph: [phone]       Fax: [phone]   RxID:   [id number]         Orders Added: 1)  Est. Patient Level III [88416]   Current Allergies (reviewed today): ! ASA

## 2010-05-24 NOTE — Letter (Signed)
Summary: [facility]/No Show notice  [facility]/No Show notice   Imported By: Maryln Gottron 11/15/2007 15:37:57  _____________________________________________________________________  External Attachment:    Type:   Image     Comment:   External Document

## 2010-05-24 NOTE — Assessment & Plan Note (Signed)
Summary: 2 MO FU/$50/PN   Vital Signs:  Patient Profile:   50 Years Old Female Height:     64 inches Weight:      214.75 pounds BMI:     36.99 Temp:     98.8 degrees F oral Pulse rate:   96 / minute Pulse rhythm:   regular Resp:     22 per minute BP sitting:   116 / 74  (right arm)  Vitals Entered By: Glendell Docker CMA (November 11, 2007 10:07 AM)                 Chief Complaint:  Follow up disease management and Type 2 diabetes mellitus follow-up.  History of Present Illness: 50 year old African-American female for follow-up.  On previous visit patient referred to rheumatologist regarding elevated CPKs and myalgias.  Workup negative for polymyositis.  Dr. Kellie Simmering feels elevated CPK is mild and can be followed.  He suspects her musculoskeletal complaints may be due to fibromyalgia.  Dr. Kellie Simmering noted ? thyromegaly.  Type 2 Diabetes Mellitus  (Borderline) Follow-Up      This is a 50 year old woman who presents for Type 2 diabetes mellitus follow-up.  The patient denies weight gain.  The patient denies the following symptoms: chest pain.  Since the last visit the patient reports good dietary compliance.  Patient has lost approximately 10 pounds since previous visit.  Hypertension Follow-Up      The patient also presents for Hypertension follow-up.  The patient denies edema.  The patient denies the following associated symptoms: chest pain.  Compliance with medications (by patient report) has been near 100%.  The patient reports that dietary compliance has been fair.  Patient noted an initial mild lightheadedness with start of bystolic.  Her  dizziness has resolved.      Current Allergies (reviewed today): ! ASA  Past Medical History:    GERD    Hypertension    Hypothyroidism    Chronic rhinosinusitis    Obstructive Sleep Apea    Obesity    Depression/Anxiety    Colonic polyps, hx of    Hyperlipidemia    Uterine Fibroids    Hoarseness            Endo - Dr. Lucianne Muss    ENT  - Dr. Annalee Genta    GI - Dr. Matthias Hughs    Pulm - Dr. Shelle Iron    Rheum - Dr. 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   Neck:     supple.  ?thyromegaly vs adipose tissue Lungs:     normal respiratory effort, no intercostal retractions, and normal breath sounds.   Heart:     normal rate, regular rhythm, and no gallop.   Abdomen:     soft and non-tender.   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 Kellie Simmering noted thyromegaly.   Unclear whether lower neck fullness is due to thyromegaly versus adipose tissue.  Obtain thyroid ultrasound. Orders: Radiology Referral (Radiology)   Problem # 2:  OTHER ABNORMAL GLUCOSE (ICD-790.29) Patient tolerating Glucophage without difficulty.  Increase  metformin to 1 g p.o. b.i.d.  Arrange A1c before next follow up visit. Her updated medication list for this problem includes:    Glucophage Xr 500 Mg Xr24h-tab (Metformin hcl) .Marland Kitchen... 2 tabs by mouth bid  Labs Reviewed: HgBA1c: 6.1 (07/05/2007)   Creat: 1.0 (08/26/2007)      Problem # 3:  HYPERTENSION (ICD-401.9) Assessment: Improved BP is improved.  Maintain current medication regimen.  Her updated medication list for this problem includes:    Benicar Hct 40-12.5 Mg Tabs (Olmesartan medoxomil-hctz) ..... One by mouth once daily    Bystolic 5 Mg Tabs (Nebivolol hcl) ..... One by mouth qd  BP today: 116/74 Prior BP: 140/87 (09/09/2007)  Labs Reviewed: Creat: 1.0 (08/26/2007) Chol: 197 (07/05/2007)   HDL: 32.5 (07/05/2007)   LDL: 126 (07/05/2007)   TG: 192 (07/05/2007)   Complete Medication List: 1)  Armour Thyroid 60 Mg Tabs (Thyroid) .... Take 1 tablet by mouth once a day 2)  Armour  Thyroid 15 Mg Tabs (Thyroid) .... Take 1 tablet by mouth once a day 3)  Potassium Chloride Crys Cr 20 Meq Tbcr (Potassium chloride crys cr) .... Take 1 tablet by mouth once a day 4)  Estrace 1 Mg Tabs (Estradiol) .... Take 1 tablet by mouth once a day 5)  Astelin 137 Mcg/spray Soln (Azelastine hcl) .... 2 sprays each nostril two times a day as needed 6)  Allergy/congestion Relief 10-240 Mg Tb24 (Loratadine-pseudoephedrine) .... Take 1 tablet by mouth once a day 7)  Benicar Hct 40-12.5 Mg Tabs (Olmesartan medoxomil-hctz) .... One by mouth once daily 8)  Glucophage Xr 500 Mg Xr24h-tab (Metformin hcl) .... 2 tabs by mouth bid 9)  Triamcinolone Acetonide 0.1 % Crea (Triamcinolone acetonide) .... Apply bid 10)  Bystolic 5 Mg Tabs (Nebivolol hcl) .... One by mouth qd   Patient Instructions: 1)  Please schedule a follow-up appointment in 3 months. 2)  HbgA1C prior to visit, ICD-9:  790.29 3)  Vit D Level: 729.1   Prescriptions: ASTELIN 137 MCG/SPRAY  SOLN (AZELASTINE HCL) 2 sprays each nostril two times a day as needed  #1 x 3   Entered and Authorized by:   D. Thomos Lemons DO   Signed by:   D. Thomos Lemons DO on 11/11/2007   Method used:   Electronically sent to ...       [facility] [address]*       [address]       [place], [place]  [address]       Ph: [phone]       Fax: [phone]   RxID:   [id number] GLUCOPHAGE XR 500 MG  XR24H-TAB (METFORMIN HCL) 2 tabs by mouth bid  #120 x 3   Entered and Authorized by:   D. Thomos Lemons DO   Signed by:   D. Thomos Lemons DO on 11/11/2007   Method used:   Electronically sent to ...       [facility] [address]*       [address]       [place], [place]  [address]       Ph: [phone]       Fax: [phone]   RxID:   [id number]  ] Current Allergies (reviewed today): ! ASA

## 2010-05-24 NOTE — Progress Notes (Signed)
Summary: Levothyroxine  Phone Note Outgoing Call   Summary of Call: I rec she switch to levothyroxine 125 micrograms.  see rx.  She will need TSH in 4 wks.  244.9 Initial call taken by: D. Thomos Lemons DO,  December 12, 2007 4:46 PM    New/Updated Medications: LEVOTHYROXINE SODIUM 125 MCG  TABS (LEVOTHYROXINE SODIUM) one by mouth qd LEVOTHYROXINE SODIUM 125 MCG  TABS (LEVOTHYROXINE SODIUM) Take 1 tablet by mouth once a day   Prescriptions: LEVOTHYROXINE SODIUM 125 MCG  TABS (LEVOTHYROXINE SODIUM) Take 1 tablet by mouth once a day  #30 x 2   Entered by:   Glendell Docker CMA   Authorized by:   D. Thomos Lemons DO   Signed by:   Glendell Docker CMA on 12/12/2007   Method used:   Electronically sent to ...       [facility]*       [address]       [address]mail[address]       [place], [place]  [address]       Ph: [phone]       Fax: [phone]   RxID:   [id number] LEVOTHYROXINE SODIUM 125 MCG  TABS (LEVOTHYROXINE SODIUM) one by mouth qd  #30 x 2   Entered and Authorized by:   D. Thomos Lemons DO   Signed by:   D. Thomos Lemons DO on 12/12/2007   Method used:   Electronically sent to ...       [facility] [address]       [address]       [place], [place]  [address]       Ph: [phone]       Fax: [phone]   RxID:   [id number]   Rx for Levothyroxine sent to [facility] ,and sent to [facility] CMA  December 12, 2007 5:02 PM

## 2010-05-24 NOTE — Assessment & Plan Note (Signed)
Summary: 6 mo f/u- jr [facility] WITH PT FROM [facility]   Vital Signs:  Patient profile:   50 year old female Height:      64 inches Weight:      220.75 pounds BMI:     38.03 O2 Sat:      98 % on Room air Temp:     97.9 degrees F oral Pulse rate:   92 / minute BP sitting:   150 / 80  (left arm) Cuff size:   large  Vitals Entered By: Lucious Groves (August 20, 2009 10:31 AM)  O2 Flow:  Room air CC: 6 mo f/u--No symptoms or complaints per pt./kb Is Patient Diabetic? No Pain Assessment Patient in pain? no        Primary Care Provider:  Dondra Spry DO  CC:  6 mo f/u--No symptoms or complaints per pt./kb.  History of Present Illness:  Hypertension Follow-Up      This is a 50 year old woman who presents for Hypertension follow-up.  The patient reports lightheadedness and fatigue.  The patient denies the following associated symptoms: chest pain and chest pressure.  Compliance with medications (by patient report) has been poor.  The patient reports that dietary compliance has been fair.  The patient reports no exercise.    Current Medications (verified): 1)  Potassium Chloride Crys Cr 20 Meq  Tbcr (Potassium Chloride Crys Cr) .... Take 1 Tablet By Mouth Once A Day 2)  Estrace 1 Mg  Tabs (Estradiol) .... Take 1 Tablet By Mouth Two Times A Day 3)  Allergy/congestion Relief 10-240 Mg  Tb24 (Loratadine-Pseudoephedrine) .... Take 1 Tablet By Mouth Once A Day 4)  Benicar Hct 40-12.5 Mg  Tabs (Olmesartan Medoxomil-Hctz) .... One By Mouth Once Daily 5)  Metformin Hcl 500 Mg Xr24h-Tab (Metformin Hcl) .Marland Kitchen.. 1 Tab in Am and 2 Tabs in Pm 6)  Triamcinolone Acetonide 0.1 %  Crea (Triamcinolone Acetonide) .... Apply Bid 7)  Bystolic 5 Mg  Tabs (Nebivolol Hcl) .... Take 1 Tablet By Mouth Once A Day 8)  Levothyroxine Sodium 88 Mcg Tabs (Levothyroxine Sodium) .... One By Mouth Qd 9)  Fish Oil 1000 Mg Caps (Omega-3 Fatty Acids) .Marland Kitchen.. 1 By Mouth Bid 10)  Green Tea 400 Mg Caps (Green Tea (Camillia Sinensis))  .Marland Kitchen.. 1 By Mouth Qd 11)  Ultra Minerals .Marland Kitchen.. 1 By Mouth Bid 12)  Garlic .Marland Kitchen.. 1 By Mouth Bid 13)  Selenium 200 Mcg Tabs (Selenium) .Marland Kitchen.. 1 By Mouth Qd 14)  Vitamin D 1,000 Iu .Marland Kitchen.. 1 By Mouth Qd 15)  Biotin 5,021mcg .Marland Kitchen.. 1 By Mouth Bid 16)  Super B-Complex .Marland KitchenMarland Kitchen. 1 By Mouth Bid 17)  Aspir-Low 81 Mg Tbec (Aspirin) .Marland Kitchen.. 1 By Mouth Qd 18)  Benadryl 25 Mg Caps (Diphenhydramine Hcl) .... By Mouth At Bedtime Prn  Allergies (verified): 1)  ! Asa  Past History:  Past Medical History: GERD Hypertension  Hypothyroidism  Chronic rhinosinusitis Obstructive Sleep Apea  Obesity Depression/Anxiety Colonic polyps, hx of Hyperlipidemia Uterine Fibroids Hoarseness     Endo - Dr. Lucianne Muss ENT - Dr. Annalee Genta GI - Dr. Matthias Hughs Pulm - Dr. Shelle Iron Rheum - Dr. 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   Lungs:  normal respiratory effort and normal breath sounds.   Heart:  normal rate, regular rhythm, and  no gallop.   Extremities:  trace left pedal edema and trace right pedal edema.   Psych:  normally interactive, good eye contact, not anxious appearing, and not depressed appearing.     Impression & Recommendations:  Problem # 1:  HYPERTENSION (ICD-401.9) pt has not been taking any of her meds for months.  she notes fatigue assoc with bp meds.   I suggest we restart just with diurectic  The following medications were removed from the medication list:    Benicar Hct 40-12.5 Mg Tabs (Olmesartan medoxomil-hctz) ..... One by mouth once daily    Bystolic 5 Mg Tabs (Nebivolol hcl) .Marland Kitchen... Take 1 tablet by mouth once a day Her updated medication list for this problem includes:    Hydrochlorothiazide 12.5 Mg Tabs (Hydrochlorothiazide) ..... One by mouth once daily  BP today: 150/80 Prior BP: 130/90 (02/05/2009)  Labs  Reviewed: K+: 3.8 (08/09/2009) Creat: : 0.9 (08/09/2009)   Chol: 155 (08/09/2009)   HDL: 47.00 (08/09/2009)   LDL: 86 (08/09/2009)   TG: 109.0 (08/09/2009)  Complete Medication List: 1)  Potassium Chloride Crys Cr 20 Meq Tbcr (Potassium chloride crys cr) .... Take 1 tablet by mouth once a day 2)  Estrace 1 Mg Tabs (Estradiol) .... Take 1 tablet by mouth two times a day 3)  Allergy/congestion Relief 10-240 Mg Tb24 (Loratadine-pseudoephedrine) .... Take 1 tablet by mouth once a day 4)  Metformin Hcl 500 Mg Xr24h-tab (Metformin hcl) .Marland Kitchen.. 1 tab in am and 2 tabs in pm 5)  Triamcinolone Acetonide 0.1 % Crea (Triamcinolone acetonide) .... Apply bid 6)  Levothyroxine Sodium 88 Mcg Tabs (Levothyroxine sodium) .... One by mouth qd 7)  Fish Oil 1000 Mg Caps (Omega-3 fatty acids) .Marland Kitchen.. 1 by mouth bid 8)  Green Tea 400 Mg Caps (green Tea (camillia Sinensis))  .Marland Kitchen.. 1 by mouth qd 9)  Ultra Minerals  .Marland KitchenMarland Kitchen. 1 by mouth bid 10)  Garlic  .Marland Kitchen.. 1 by mouth bid 11)  Selenium 200 Mcg Tabs (Selenium) .Marland Kitchen.. 1 by mouth qd 12)  Vitamin D 1,000 Iu  .Marland Kitchen.. 1 by mouth qd 13)  Biotin 5,044mcg  .Marland KitchenMarland Kitchen. 1 by mouth bid 14)  Super B-complex  .Marland KitchenMarland Kitchen. 1 by mouth bid 15)  Aspir-low 81 Mg Tbec (Aspirin) .Marland Kitchen.. 1 by mouth qd 16)  Benadryl 25 Mg Caps (Diphenhydramine hcl) .... By mouth at bedtime prn 17)  Hydrochlorothiazide 12.5 Mg Tabs (Hydrochlorothiazide) .... One by mouth once daily  Patient Instructions: 1)  Please schedule a follow-up appointment in 2 months. 2)  BMP prior to visit, ICD-9:  401.9 3)  TSH prior to visit, ICD-9: 244.90 4)  Please return for lab work one (1) week before your next appointment.  Prescriptions: HYDROCHLOROTHIAZIDE 12.5 MG TABS (HYDROCHLOROTHIAZIDE) one by mouth once daily  #30 x 5   Entered and Authorized by:   D. Thomos Lemons DO   Signed by:   D. Thomos Lemons DO on 08/20/2009   Method used:   Electronically to        [facility]* (retail)       [address]       [address] [place]  Shipping/mailing       [place], [place]  [address]       Ph: [phone]       Fax: [phone]   RxID:   [id number]

## 2010-05-24 NOTE — Assessment & Plan Note (Signed)
Summary: 3 months rov/ch   Vital Signs:  Patient Profile:   50 Years Old Female Height:     64 inches Weight:      217.50 pounds BMI:     37.47 Temp:     98.4 degrees F oral Pulse rate:   76 / minute Pulse rhythm:   regular Resp:     16 per minute BP sitting:   114 / 82  (right arm) Cuff size:   regular  Vitals Entered By: Glendell Docker CMA (February 11, 2008 10:28 AM)                 PCP:  Dondra Spry DO  Chief Complaint:  Follow up disease management.  History of Present Illness:  Hypertension Follow-Up      This is a 50 year old woman who presents for Hypertension follow-up.  The patient denies lightheadedness and edema.  The patient denies the following associated symptoms: chest pain.  Compliance with medications (by patient report) has been near 100%.  The patient reports that dietary compliance has been fair.  The patient reports exercising occasionally.    We reviewed previous thyroid u/s - normal.  Pt reports hands/finger tips are cold.   She denies numbness or tingling.    Current Allergies (reviewed today): ! ASA  Past Medical History:    GERD    Hypertension    Hypothyroidism    Chronic rhinosinusitis    Obstructive Sleep Apea    Obesity    Depression/Anxiety    Colonic polyps, hx of    Hyperlipidemia    Uterine Fibroids    Hoarseness             Endo - Dr. Lucianne Muss    ENT - Dr. Annalee Genta    GI - Dr. Matthias Hughs    Pulm - Dr. Shelle Iron    Rheum - Dr. 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  The patient denies chest pain.     Physical Exam  General:     alert, well-developed, and well-nourished.   Head:     normocephalic and atraumatic.   Neck:     supple.  no masses and no carotid bruits.   Lungs:     normal respiratory effort and normal breath sounds.   Heart:     normal rate, regular rhythm, and no gallop.   Abdomen:     soft and non-tender.   Pulses:     R radial normal and L radial normal.   Extremities:     No lower extremity edema  Neurologic:     alert & oriented X3, cranial nerves II-XII intact, and gait normal.  Normal vibratory sense.    Impression &  Recommendations:  Problem # 1:  HYPERTENSION (ICD-401.9) Assessment: Unchanged Well controlled.   Maintain current medication regimen.  Her updated medication list for this problem includes:    Benicar Hct 40-12.5 Mg Tabs (Olmesartan medoxomil-hctz) ..... One by mouth once daily    Bystolic 5 Mg Tabs (Nebivolol hcl) .Marland Kitchen... Take 1 tablet by mouth once a day  BP today: 114/82 Prior BP: 116/74 (11/11/2007)  Labs Reviewed: Creat: 1.0 (08/26/2007) Chol: 197 (07/05/2007)   HDL: 32.5 (07/05/2007)   LDL: 126 (07/05/2007)   TG: 192 (07/05/2007)   Problem # 2:  OTHER ABNORMAL GLUCOSE (ICD-790.29) Assessment: Unchanged A1c stable.  Maintain current medication regimen.  Her updated medication list for this problem includes:    Glucophage Xr 500 Mg Xr24h-tab (Metformin hcl) .Marland Kitchen... 2 tabs by mouth bid  Labs Reviewed: HgBA1c: 5.6 (02/06/2008)   Creat: 1.0 (08/26/2007)      Problem # 3:  THYROMEGALY (ICD-240.9) Assessment: Improved 12/10/07  Thyroid u/s normal - Findings: Overall gland size with the right lobe 4.2 x 1.6 x 1.8 cm, and the left lobe 4.3 x 1.5 x 1.6 cm.   The echotexture is mildly inhomogeneous.  There are no discrete solid masses or cysts.  Complete Medication  List: 1)  Potassium Chloride Crys Cr 20 Meq Tbcr (Potassium chloride crys cr) .... Take 1 tablet by mouth once a day 2)  Estrace 1 Mg Tabs (Estradiol) .... Take 1 tablet by mouth once a day 3)  Allergy/congestion Relief 10-240 Mg Tb24 (Loratadine-pseudoephedrine) .... Take 1 tablet by mouth once a day 4)  Benicar Hct 40-12.5 Mg Tabs (Olmesartan medoxomil-hctz) .... One by mouth once daily 5)  Glucophage Xr 500 Mg Xr24h-tab (Metformin hcl) .... 2 tabs by mouth bid 6)  Triamcinolone Acetonide 0.1 % Crea (Triamcinolone acetonide) .... Apply bid 7)  Bystolic 5 Mg Tabs (Nebivolol hcl) .... Take 1 tablet by mouth once a day 8)  Levothyroxine Sodium 112 Mcg Tabs (Levothyroxine sodium) .... One by mouth once daily   Patient Instructions: 1)  Please schedule a follow-up appointment in 4 months. 2)  BMP prior to visit, ICD-9:  401.9 3)  Lipid Panel prior to visit, ICD-9:  790.29 4)  AST, ALT:    790.29 5)  HbgA1C prior to visit, ICD-9:   790.29 6)  Please return for lab work one (1) week before your next appointment.    ] Current Allergies (reviewed today): ! ASA   Preventive Care Screening  Mammogram:    Date:  02/11/2008    Results:  Appointment  02/21/08  Pap Smear:    Date:  02/04/2008    Results:  Done   Last Flu Shot:    Date:  01/23/2008    Results:  given

## 2010-05-24 NOTE — Progress Notes (Signed)
Summary: tsh result  Phone Note Outgoing Call   Summary of Call: call pt - TSH normal.  Keep taking current dose.  I suggest we repeat TSH in 6 months Initial call taken by: D. Thomos Lemons DO,  December 14, 2009 5:59 PM  Follow-up for Phone Call        Pt advised. Lab appt scheduled for 06/13/10 at [facility]. Mervin Kung CMA Duncan Dull)  December 15, 2009 10:39 AM     Prescriptions: LEVOTHYROXINE SODIUM 75 MCG TABS (LEVOTHYROXINE SODIUM) one by mouth once daily  #30 x 5   Entered and Authorized by:   D. Thomos Lemons DO   Signed by:   D. Thomos Lemons DO on 12/14/2009   Method used:   Electronically to        [facility]* (retail)       [address]       [address] [place] Shipping/mailing       [place], [place]  [address]       Ph: [phone]       Fax: [phone]   RxID:   [id number]

## 2010-05-24 NOTE — Progress Notes (Signed)
Summary: Lab Results  Phone Note Outgoing Call   Summary of Call: call pt - blood test shows pt taking too much thyroid medication.  see reduced dose.  schedule  TSH in 2 months.  244.9 Initial call taken by: D. Thomos Lemons DO,  October 11, 2009 10:21 PM  Follow-up for Phone Call        patient advised per Dr Artist Pais instructions, she states she has a follow up appt with Dr Artist Pais on Monday and will dicuss then Follow-up by: Glendell Docker CMA,  October 12, 2009 9:04 AM    New/Updated Medications: LEVOTHYROXINE SODIUM 75 MCG TABS (LEVOTHYROXINE SODIUM) one by mouth once daily Prescriptions: LEVOTHYROXINE SODIUM 75 MCG TABS (LEVOTHYROXINE SODIUM) one by mouth once daily  #30 x 1   Entered and Authorized by:   D. Thomos Lemons DO   Signed by:   D. Thomos Lemons DO on 10/11/2009   Method used:   Electronically to        [facility]* (retail)       [address]       [address] [place] Shipping/mailing       [place], [place]  [address]       Ph: [phone]       Fax: [phone]   RxID:   [id number]

## 2010-05-24 NOTE — Progress Notes (Signed)
Summary: lab results  Phone Note Outgoing Call   Summary of Call: call pt - cpk elevated. I rec referral to rheumatologist. (electrolytes and kidney function - normal) Initial call taken by: D. Thomos Lemons DO,  Aug 26, 2007 5:31 PM  Follow-up for Phone Call        left voice message at [phone] to call Follow-up by: Glendell Docker,  Aug 27, 2007 10:17 AM  Additional Follow-up for Phone Call Additional follow up Details #1::        Patient informed Additional Follow-up by: Glendell Docker,  Aug 28, 2007 8:33 AM

## 2010-05-24 NOTE — Assessment & Plan Note (Signed)
Summary: 2 MONTH ROV-CH   Vital Signs:  Patient profile:   50 year old female Weight:      217.75 pounds BMI:     37.51 Temp:     98.3 degrees F oral Pulse rate:   88 / minute Pulse rhythm:   regular Resp:     16 per minute BP sitting:   106 / 70  (right arm) Cuff size:   large  Vitals Entered By: Glendell Docker CMA (October 05, 2008 3:08 PM)  O2 Flow:  Room air  Primary Care Provider:  D. Thomos Lemons DO  CC:  2 Month followup.  History of Present Illness: 2 Month follow up disease management 50 y/o AA female c/o intermittent left sided abd pain and bloating.  She has assoc constipation and diarrhea.   No fever.   No dyuria.  Htn - stable.   Hypothyroidism - TSH normal.   No wt gain.    Adjustment disorder - occ has difficulty with managing stress.   Tearful episodes.   She is estranged from her mother.   Her mother is in nursing home and has dementia.     Allergies: 1)  ! Asa  Past History:  Past Medical History: GERD Hypertension Hypothyroidism Chronic rhinosinusitis Obstructive Sleep Apea  Obesity Depression/Anxiety Colonic polyps, hx of Hyperlipidemia Uterine Fibroids Hoarseness     Endo - Dr. Lucianne Muss ENT - Dr. Annalee Genta GI - Dr. Matthias Hughs Pulm - Dr. Shelle Iron Rheum - Dr. 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    Physical Exam  General:  alert and overweight-appearing.   Head:  normocephalic and atraumatic.   Neck:  supple and no masses.   Lungs:  normal respiratory effort and normal breath sounds.   Heart:  normal rate, regular rhythm, and no gallop.   Abdomen:  soft and non-tender.   Extremities:  No lower extremity edema  Neurologic:   cranial nerves II-XII intact and gait normal.   Psych:  normally interactive, good eye contact, tearful, and slightly anxious.     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   Her mother is in nursing home.  Poor relationship.    We discussed use of counseling vs use of SSRI.  Problem # 2:  ABDOMINAL BLOATING (ICD-787.3)  I suspect IBS.  Check U/A.   Use fiber supplement and probiotic.    Orders: UA Dipstick w/o Micro (manual) (04540)  Problem # 3:  HYPERTENSION (ICD-401.9) Well controlled. Maintain current medication regimen.  Her updated medication list for this problem includes:    Benicar Hct 40-12.5 Mg Tabs (Olmesartan medoxomil-hctz) ..... One by mouth once daily    Bystolic 5 Mg Tabs (Nebivolol hcl) .Marland Kitchen... Take 1 tablet by mouth once a day  BP today: 106/70 Prior BP: 118/72 (08/10/2008)  Labs Reviewed: K+: 4.2 (06/09/2008) Creat: : 0.9 (06/09/2008)   Chol: 184 (06/09/2008)   HDL: 38.6 (06/09/2008)   LDL: 108 (06/09/2008)   TG: 188 (06/09/2008)  Problem # 4:  MYALGIA (ICD-729.1) Assessment: Comment Only She prev has elevated CPK.   It may have been drug effect.  CPK decreased.  Slight elevation.   CPK 208.  Problem # 5:  HYPOTHYROIDISM (ICD-244.9) TSH therapeutic.   Maintain current medication regimen.  Her updated medication list for this problem includes:    Levothyroxine Sodium 88 Mcg Tabs (Levothyroxine sodium) ..... One by mouth qd  Labs Reviewed: TSH: 0.47 (09/28/2008)    HgBA1c: 5.9 (06/09/2008) Chol: 184 (06/09/2008)   HDL: 38.6 (06/09/2008)   LDL: 108 (06/09/2008)   TG: 188 (06/09/2008)  Complete Medication List: 1)  Potassium Chloride Crys Cr 20 Meq Tbcr (Potassium chloride crys cr) .... Take 1 tablet by mouth once a day 2)  Estrace 1 Mg Tabs (Estradiol) .... Take 1 tablet by mouth two times a day 3)  Allergy/congestion Relief 10-240 Mg Tb24  (Loratadine-pseudoephedrine) .... Take 1 tablet by mouth once a day 4)  Benicar Hct 40-12.5 Mg Tabs (Olmesartan medoxomil-hctz) .... One by mouth once daily 5)  Metformin Hcl 500 Mg Xr24h-tab (Metformin hcl) .Marland Kitchen.. 1 tab in am and 2 tabs in pm 6)  Triamcinolone Acetonide 0.1 % Crea (Triamcinolone acetonide) .... Apply bid 7)  Bystolic 5 Mg Tabs (Nebivolol hcl) .... Take 1 tablet by mouth once a day 8)  Levothyroxine Sodium 88 Mcg Tabs (Levothyroxine sodium) .... One by mouth qd 9)  Nexium 40 Mg Cpdr (Esomeprazole magnesium) .... One by mouth two times a day  Patient Instructions: 1)  Please schedule a follow-up appointment in 4 months. 2)  Take fiber supplement (metamucile) 1 tsp once a day with 8-10 oz of water daily. 3)  Take probiotic (Accuflora) once daily.   4)  BMP prior to visit, ICD-9:  401.9 5)  Hepatic Panel prior to visit, ICD-9: 272.4 6)  Lipid Panel prior to visit, ICD-9: 272.4 7)  TSH prior to visit, ICD-9: 244.9 8)  HbgA1C prior to visit, ICD-9: 790.29 9)  Please return for lab work one (1) week before your next appointment.   Current Allergies (reviewed today): ! ASA  Laboratory Results   Urine Tests    Routine Urinalysis   Color: yellow Appearance: Clear Glucose: negative   (Normal Range: Negative) Bilirubin: negative   (Normal Range: Negative) Ketone: negative   (Normal Range: Negative) Spec. Gravity: 1.020   (Normal Range: 1.003-1.035) Blood: negative   (Normal Range: Negative) pH: 6.5   (Normal Range: 5.0-8.0) Protein: 100   (Normal Range: Negative) Urobilinogen: 0.2   (Normal Range: 0-1) Nitrite: negative   (Normal Range: Negative) Leukocyte Esterace: negative   (Normal Range: Negative)

## 2010-05-24 NOTE — Progress Notes (Signed)
Summary: Lab results  Phone Note Outgoing Call   Summary of Call: call pt  - blood test shows pt is taking too much thyroid hormone.  I rec we decrease to 100 micrograms.  see rx.  TSH needs to be repeated in 1 month.  244.90 Initial call taken by: D. Thomos Lemons DO,  January 13, 2008 2:43 PM  Follow-up for Phone Call        Left voice message at [phone] to call regarding lab results Follow-up by: Glendell Docker CMA,  January 14, 2008 8:42 AM  Additional Follow-up for Phone Call Additional follow up Details #1::        call pt - vit D level slightly low.  I rec pt take Vit D3 1000 units 2 tabs by mouth once daily (OTC)  We will repeat vit D level at next ov Additional Follow-up by: D. Thomos Lemons DO,  January 14, 2008 2:22 PM    Additional Follow-up for Phone Call Additional follow up Details #2::    patient informed per Dr Artist Pais instructions Follow-up by: Glendell Docker CMA,  January 14, 2008 5:08 PM  New/Updated Medications: LEVOTHYROXINE SODIUM 112 MCG TABS (LEVOTHYROXINE SODIUM) one by mouth once daily   Prescriptions: LEVOTHYROXINE SODIUM 112 MCG TABS (LEVOTHYROXINE SODIUM) one by mouth once daily  #30 x 2   Entered and Authorized by:   D. Thomos Lemons DO   Signed by:   D. Thomos Lemons DO on 01/13/2008   Method used:   Electronically to        [facility]* (retail)       [address]       [address] [place] Shipping/mailing       [place], [place]  [address]       Ph: [phone]       Fax: [phone]   RxID:   [id number]

## 2010-05-24 NOTE — Progress Notes (Signed)
Summary: Blood Work  Programme researcher, broadcasting/film/video of Call: call pt - levothyroxine dose too high.  I rec pt take 88 micrograms.   see rx .   TSH needs to be repeated in 2 months 244.9 Initial call taken by: D. Thomos Lemons DO,  August 20, 2008 1:05 PM  Follow-up for Phone Call        attempted to contact patient at [phone] no answer voice message left to call regarding blood work Follow-up by: Glendell Docker CMA,  August 20, 2008 2:48 PM  Additional Follow-up for Phone Call Additional follow up Details #1::        patient advised per Dr Artist Pais instructions Additional Follow-up by: Glendell Docker CMA,  August 21, 2008 11:48 AM    New/Updated Medications: LEVOTHYROXINE SODIUM 88 MCG TABS (LEVOTHYROXINE SODIUM) one by mouth qd   Prescriptions: LEVOTHYROXINE SODIUM 88 MCG TABS (LEVOTHYROXINE SODIUM) one by mouth qd  #30 x 2   Entered and Authorized by:   D. Thomos Lemons DO   Signed by:   D. Thomos Lemons DO on 08/20/2008   Method used:   Electronically to        [facility]* (retail)       [address]       [address] [place] Shipping/mailing       [place], [place]  [address]       Ph: [phone]       Fax: [phone]   RxID:   [id number]

## 2010-05-24 NOTE — Letter (Signed)
Summary: [facility] Consult Scheduled Letter  [facility]  [address] [place], [place] [address]   Phone: [phone]  Fax: [phone]      08/27/2007 MRN: [id number]  [facility] [address] [place], [place]  [address]    Dear Ms. Lazcano,      We have scheduled an appointment for you.  At the recommendation of Dr.Yoo, we have scheduled you a consult with Dr Kellie Simmering on 09/25/07 at 2:45pm.  Their phone number is [phone].If this appointment day and time is not convenient for you, please feel free to call the office of the doctor you are being referred to at the number listed above and reschedule the appointment.      Stacey Drain [address] [place] [address]    Thank you,  Patient Care Coordinator [facility]

## 2010-05-24 NOTE — Progress Notes (Signed)
Summary: barium swallow result  Phone Note Outgoing Call   Summary of Call: call pt - barium swallow was normal.  if pt having persistent regurgitation, it may be from gastroparesis.  we can obtain gastric empyting study if symptoms get worse.  I suggest small low fat,  low protein meals. Initial call taken by: D. Thomos Lemons DO,  February 24, 2010 5:21 PM  Follow-up for Phone Call        Pt notified. Nicki Guadalajara Fergerson CMA Duncan Dull)  February 25, 2010 3:11 PM

## 2010-05-24 NOTE — Consult Note (Signed)
Summary: office note/W.Truslow,MD  office note/W.Truslow,MD   Imported By: Lester Reliance 10/03/2007 11:35:28  _____________________________________________________________________  External Attachment:    Type:   Image     Comment:   External Document

## 2010-05-24 NOTE — Miscellaneous (Signed)
Summary: BONE DENSITY  Clinical Lists Changes  Orders: Added new Test order of T-Bone Densitometry (77080) - Signed Added new Test order of T-Lumbar Vertebral Assessment (77082) - Signed 

## 2010-05-24 NOTE — Letter (Signed)
Summary: [facility]  [facility]   Imported By: Esmeralda Links D'jimraou 10/09/2007 14:54:24  _____________________________________________________________________  External Attachment:    Type:   Image     Comment:   External Document

## 2010-05-24 NOTE — Assessment & Plan Note (Signed)
Summary: 4 MONTH ROV-CH   Vital Signs:  Patient profile:   50 year old female Weight:      221.50 pounds BMI:     38.16 Temp:     98.4 degrees F oral Pulse rate:   80 / minute Pulse rhythm:   regular Resp:     18 per minute BP sitting:   130 / 90  (right arm) Cuff size:   large  Vitals Entered By: Glendell Docker CMA (February 05, 2009 10:01 AM)  Primary Care Provider:  Dondra Spry DO  CC:  4 Month Follow up.  History of Present Illness: 4 Month Follow up disease management  Hypertension Follow-Up      This is a 50 year old woman who presents for Hypertension follow-up.  The patient denies lightheadedness.  Associated symptoms include dyspnea.  The patient denies the following associated symptoms: chest pain.  Compliance with medications (by patient report) has been near 100%.    DM II borderline - wt stable.  a1c improved.  Hyperlipidemia - reasonable diet  Allergies: 1)  ! Asa  Past History:  Past Medical History: GERD Hypertension Hypothyroidism  Chronic rhinosinusitis Obstructive Sleep Apea  Obesity Depression/Anxiety Colonic polyps, hx of Hyperlipidemia Uterine Fibroids Hoarseness     Endo - Dr. Lucianne Muss ENT - Dr. Annalee Genta GI - Dr. Matthias Hughs Pulm - Dr. Shelle Iron Rheum - Dr. 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   Neck:  supple and no masses.   Lungs:  normal respiratory effort and normal breath sounds.   Heart:  normal rate, regular rhythm, and no gallop.   Extremities:  No lower extremity edema  Psych:  normally interactive, good eye contact, and slightly anxious.     Impression  & Recommendations:  Problem # 1:  HYPERLIPIDEMIA (ICD-272.4) Elevated triglycerides.   Increase fish oil intake.  Exercise encouraged.  Labs Reviewed: SGOT: 24 (01/29/2009)   SGPT: 19 (01/29/2009)   HDL:51.10 (01/29/2009), 38.6 (06/09/2008)  LDL:108 (06/09/2008), 126 (07/05/2007)  Chol:173 (01/29/2009), 184 (06/09/2008)  Trig:250.0 (01/29/2009), 188 (06/09/2008)  Problem # 2:  HYPERTENSION (ICD-401.9) Stable.  Maintain current medication regimen.  Her updated medication list for this problem includes:    Benicar Hct 40-12.5 Mg Tabs (Olmesartan medoxomil-hctz) ..... One by mouth once daily    Bystolic 5 Mg Tabs (Nebivolol hcl) .Marland Kitchen... Take 1 tablet by mouth once a day  BP today: 130/90 Prior BP: 106/70 (10/05/2008)  Labs Reviewed: K+: 4.3 (01/29/2009) Creat: : 0.9 (01/29/2009)   Chol: 173 (01/29/2009)   HDL: 51.10 (01/29/2009)   LDL: 108 (06/09/2008)   TG: 250.0 (01/29/2009)  Problem # 3:  OTHER ABNORMAL GLUCOSE (ICD-790.29) A1c improved.  Maintain current medication regimen.  Urged  regular exercise.  Her updated medication list for this problem includes:    Metformin Hcl 500 Mg Xr24h-tab (Metformin hcl) .Marland Kitchen... 1 tab in am and 2 tabs in pm  Labs Reviewed: Creat: 0.9 (01/29/2009)     Problem # 4:  DYSPNEA ON EXERTION (ICD-786.09) Likely deconditioning.  she had stress test and 2d echo 2 yrs ago at [facility] cardiology.  Stress test in 10/02/2006 - normal.  EF 69 %.  2D echo 10/03/2005 -  Normal systolic function.  Mild LVH.  Pt advised to start with low intensity walking program and gradually increase intensity.  Her updated medication list for this problem includes:    Benicar Hct 40-12.5 Mg Tabs (Olmesartan medoxomil-hctz) ..... One by mouth once daily    Bystolic 5 Mg Tabs (Nebivolol hcl) .Marland Kitchen... Take 1 tablet by mouth once a day  Complete Medication List: 1)  Potassium Chloride Crys Cr 20 Meq Tbcr (Potassium chloride crys cr) .... Take 1 tablet by mouth once a day 2)  Estrace 1 Mg  Tabs (Estradiol) .... Take 1 tablet by mouth two times a day 3)  Allergy/congestion Relief 10-240 Mg Tb24 (Loratadine-pseudoephedrine) .... Take 1 tablet by mouth once a day 4)  Benicar Hct 40-12.5 Mg Tabs (Olmesartan medoxomil-hctz) .... One by mouth once daily 5)  Metformin Hcl 500 Mg Xr24h-tab (Metformin hcl) .Marland Kitchen.. 1 tab in am and 2 tabs in pm 6)  Triamcinolone Acetonide 0.1 % Crea (Triamcinolone acetonide) .... Apply bid 7)  Bystolic 5 Mg Tabs (Nebivolol hcl) .... Take 1 tablet by mouth once a day 8)  Levothyroxine Sodium 88 Mcg Tabs (Levothyroxine sodium) .... One by mouth qd  Patient Instructions: 1)  Please schedule a follow-up appointment in 6 months. 2)  BMP prior to visit, ICD-9:  401.9 3)  HbgA1C prior to visit, ICD-9: 790.29 4)  Lipid Panel prior to visit, ICD-9: 272.4 5)  Please return for lab work one (1) week before your next appointment.  6)  It is important that you exercise regularly. 7)  See handout on hypertriglyceridemia. 8)  Take over the counter fish oil (omega 3 fatty acid) caps twice daily   Preventive Care Screening  Last Tetanus Booster:    Date:  12/18/2000    Results:  Tdap     Immunization History:  Influenza Immunization History:    Influenza:  fluvax 3+ (01/28/2009)   Current Allergies (reviewed today): ! ASA

## 2010-05-24 NOTE — Assessment & Plan Note (Signed)
Summary: 6 wk rov/nws  $50   Vital Signs:  Patient Profile:   50 Years Old Female Height:     64 inches Weight:      222.13 pounds BMI:     38.27 Temp:     98.5 degrees F oral Pulse rate:   93 / minute BP sitting:   140 / 87  (right arm)  Vitals Entered By: Glendell Docker (Sep 09, 2007 9:40 AM)                 Chief Complaint:  6 WEEK F/U DISEASE MANAGEMENT.  History of Present Illness:  Hypertension Follow-Up      This is a 50 year old woman who presents for Hypertension follow-up.  The patient reports occasional lightheadedness.  The patient denies the following associated symptoms: chest pain.  Compliance with medications (by patient report) has been near 100%.  The patient reports that dietary compliance has been good.  The patient reports no exercise.    Pt tolerating glucophage.  No adverse effects noted.  She is taking after meals.    Current Allergies (reviewed today): ! ASA  Past Medical History:    Reviewed history from 06/06/2007 and no changes required:       GERD       Hypertension       Hypothyroidism       Chronic rhinosinusitis       Obstructive Sleep Apea       Obesity       Depression/Anxiety       Colonic polyps, hx of       Hyperlipidemia       Uterine Fibroids       Hoarseness                     Endo - Dr. Lucianne Muss       ENT - Dr. Annalee Genta       GI - Dr. Matthias Hughs       Pulm - Dr. 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  We reviewed mild elevation in CPK.   Physical Exam  General:     obese female in no acute distress Neck:     supple and no masses.   Lungs:     normal respiratory effort and normal breath sounds.   Heart:     normal rate, regular rhythm, and no gallop.   Msk:     No lower extremity edema     Impression & Recommendations:  Problem # 1:  HYPERTENSION (ICD-401.9) BP improved but still suboptimal.  Add Bystolic.  Samples provided. Her updated medication list for this problem includes:    Benicar Hct 40-12.5 Mg Tabs (Olmesartan medoxomil-hctz) ..... One by mouth once daily    Bystolic 5 Mg Tabs (Nebivolol hcl) ..... One by  mouth qd  BP today: 140/87 Prior BP: 124/78 (07/26/2007)  Labs Reviewed: Creat: 1.0 (08/26/2007) Chol: 197 (07/05/2007)   HDL: 32.5 (07/05/2007)   LDL: 126 (07/05/2007)   TG: 192 (07/05/2007)   Problem # 2:  OTHER ABNORMAL GLUCOSE (ICD-790.29) Pt tolerating glucophage xr.  Pt requests nutritional consult. Her updated medication list for this problem includes:    Glucophage Xr 750 Mg Tb24 (Metformin hcl) ..... One by mouth bid  Orders: Diabetic Clinic Referral (Diabetic)   Problem # 3:  MYALGIA (ICD-729.1) She has f/u appt with Dr. Kellie Simmering.  Her previous CPK reading last year was 313.  Question drug effect vs polymyositits (doubt).  Complete Medication List: 1)  Armour Thyroid 60 Mg Tabs (Thyroid) .... Take 1 tablet by mouth once a day 2)  Armour Thyroid 15 Mg Tabs (Thyroid) .... Take 1 tablet by mouth once a day 3)  Potassium Chloride Crys Cr 20 Meq Tbcr (Potassium chloride crys cr) .... Take 1 tablet by mouth once a day 4)  Estrace 1 Mg Tabs (Estradiol) .... Take 1 tablet by mouth once a day 5)  Afrin Nasal Spray 0.05 % Soln (Oxymetazoline hcl) 6)  Allergy/congestion Relief 10-240 Mg Tb24 (Loratadine-pseudoephedrine) .... Take 1 tablet by mouth once a day 7)  Nexium 40 Mg Cpdr (Esomeprazole magnesium) .... One by mouth once daily 8)  Benicar Hct 40-12.5 Mg Tabs (Olmesartan medoxomil-hctz) .... One by  mouth once daily 9)  Glucophage Xr 750 Mg Tb24 (Metformin hcl) .... One by mouth bid 10)  Triamcinolone Acetonide 0.1 % Crea (Triamcinolone acetonide) .... Apply bid 11)  Bystolic 5 Mg Tabs (Nebivolol hcl) .... One by mouth qd   Patient Instructions: 1)  Please schedule a follow-up appointment in 2 months.   Prescriptions: ARMOUR THYROID 15 MG  TABS (THYROID) Take 1 tablet by mouth once a day  #30 x 5   Entered and Authorized by:   D. Thomos Lemons DO   Signed by:   D. Thomos Lemons DO on 09/09/2007   Method used:   Electronically sent to ...       [facility] [address]       [address]       [place], [place]  [address]       Ph: [phone]       Fax: [phone]   RxID:   [id number] ARMOUR THYROID 60 MG  TABS (THYROID) Take 1 tablet by mouth once a day  #30 x 5   Entered and Authorized by:   D. Thomos Lemons DO   Signed by:   D. Thomos Lemons DO on 09/09/2007   Method used:   Electronically sent to ...       [facility] [address]       [address]       [place], [place]  [address]       Ph: [phone]       Fax: [phone]   RxID:   [id number] BYSTOLIC 5 MG  TABS (NEBIVOLOL HCL) one by mouth qd  #30 x 3   Entered and Authorized by:   D. Thomos Lemons DO   Signed by:   D. Thomos Lemons DO on 09/09/2007   Method used:   Electronically sent to ...       [facility] [address] [phone]  [address]       [place], [place]  [address]       Ph: [phone]       Fax: [phone]   RxID:   [id number] GLUCOPHAGE XR 750 MG  TB24 (METFORMIN HCL) one by mouth bid  #60 x 5   Entered and Authorized by:   D. Thomos Lemons DO   Signed by:   D. Thomos Lemons DO on 09/09/2007   Method used:   Electronically sent to ...       [facility] [address]       [address] [place], [place]  [address]       Ph: [phone]       Fax: [phone]   RxID:   [id number]  ]  Current Allergies (reviewed today): ! ASA

## 2010-05-24 NOTE — Assessment & Plan Note (Signed)
Summary: f/u osa/cpap   Chief Complaint:  Follow up.  Pt states she is using her cpap every night. Pt denied any complaints.  .  History of Present Illness: The pt comes in today for f/u of osa.  She has been compliant with therapy, but continues to have difficulties with nasal congestion intermittantly.   Unfortunately she uses a nasal mask, which is most comfortable for her.  I have been concerned about mouth opening that results in suboptimal pressure delivery.  The patient feels that she has had a good response to CPAP with respect to restorative sleep and daytime alertness.  She is having no difficulty with the CPAP pressure.     Current Allergies: ! ASA     Review of Systems      See HPI   Vital Signs:  Patient Profile:   50 Years Old Female Height:     64 inches Weight:      227.38 pounds O2 Sat:      98 % O2 treatment:    Room Air Temp:     98.9 degrees F oral Pulse rate:   91 / minute BP sitting:   124 / 78  (left arm) Cuff size:   regular  Vitals Entered By: Cyndia Diver LPN (July 25, 8117 10:40 AM)             Comments Medications reviewed with patient  ..................................................................Marland KitchenCyndia Diver LPN  July 25, 1476 10:44 AM      Physical Exam  General:     obese female in no acute distress Nose:     no skin breakdown or pressure necrosis from the CPAP mask     Impression & Recommendations:  Problem # 1:  SLEEP APNEA, OBSTRUCTIVE (ICD-327.23) the patient is on CPAP at an optimal pressure, and seems to be doing fairly well overall.  She continues to have difficulties with nasal congestion, and we may ultimately have to consider a full face mask or ENT/allergy evaluation.  I have again urged the patient to work aggressively on weight loss, and have asked her to call us if she has difficulty with CPAP tolerance that is interfering with compliance.     Patient Instructions: 1)  Please schedule a follow-up  appointment in 1 year. 2)  If you continue to have nasal congestion, may have to consider allergy eval and changing to full face mask for cpap    ]

## 2010-05-24 NOTE — Progress Notes (Signed)
Summary: RX  Phone Note Call from Patient Call back at Home Phone [phone]   Caller: Patient Summary of Call: Pt want a refill on K PLUS 20 mg. Initial call taken by: Livingston Diones,  October 21, 2007 4:45 PM      Prescriptions: POTASSIUM CHLORIDE CRYS CR 20 MEQ  TBCR (POTASSIUM CHLORIDE CRYS CR) Take 1 tablet by mouth once a day  #30 x 6   Entered by:   Lamar Sprinkles   Authorized by:   D. Thomos Lemons DO   Signed by:   Lamar Sprinkles on 10/22/2007   Method used:   Electronically sent to ...       [facility] [address]       [address]       [place], [place]  [address]       Ph: [phone]       Fax: [phone]   RxID:   [id number]

## 2010-05-24 NOTE — Miscellaneous (Signed)
Summary: Future orders--Lab  Clinical Lists Changes  Orders: Added new Test order of T-Basic Metabolic Panel [phone]) - Signed Added new Test order of T-TSH [id number]) - Signed

## 2010-05-24 NOTE — Letter (Signed)
     July 08, 2007   [facility] [address] [place], [place] [address]  RE:  LAB RESULTS  Dear  Ms. Falotico,  The following is an interpretation of your most recent lab tests.  Please take note of any instructions provided or changes to medications that have resulted from your lab work.  ELECTROLYTES:  Good - no changes needed  KIDNEY FUNCTION TESTS:  Good - no changes needed  LIVER FUNCTION TESTS:  Good - no changes needed  LIPID PANEL:  Fair - review at your next visit Triglyceride: 192   Cholesterol: 197   LDL: 126   HDL: 32.5   Chol/HDL%:  6.1 CALC  THYROID STUDIES:  Thyroid studies normal TSH: 0.58     DIABETIC STUDIES:  Fair - schedule a follow-up appointment Blood Glucose: 104   HgbA1C: 6.1     CBC:  Fair - review at your next visit  Please call office for follow up appointment within next 2-4 weeks to discuss your labs.   Sincerely Yours,    Dr. Thomos Lemons

## 2010-05-26 NOTE — Assessment & Plan Note (Signed)
Summary: 6 month follow up/[facility] WITH PT FROM [facility]   Vital Signs:  Patient profile:   50 year old female Height:      64 inches Weight:      226.75 pounds BMI:     39.06 O2 Sat:      99 % on Room air Temp:     97.8 degrees F oral Pulse rate:   86 / minute Resp:     18 per minute BP sitting:   130 / 80  (left arm) Cuff size:   large  Vitals Entered By: Glendell Docker CMA (April 11, 2010 3:47 PM)  O2 Flow:  Room air CC: 6 Month Follow up , Cough Is Patient Diabetic? No Pain Assessment Patient in pain? no      Comments would like to review labs from health screening, c/o daily headaches for the past 3 weeks, unresolved cough for the past 3 month, right side back pain with numbing in her buttocks, refill on Metformin   Primary Care Provider:  Dondra Spry DO  CC:  6 Month Follow up  and Cough.  History of Present Illness:  Cough      This is a 50 year old woman who presents with Cough.  The patient reports non-productive cough, but denies shortness of breath, exertional dyspnea, and fever.  Associated symtpoms include nasal congestion and acid reflux symptoms.    Allergies: 1)  ! Asa  Past History:  Past Medical History: GERD Hypertension  Hypothyroidism    Chronic rhinosinusitis  Obstructive Sleep Apea  Obesity Depression/Anxiety Colonic polyps, hx of Hyperlipidemia Uterine Fibroids Hoarseness     Endo - Dr. Lucianne Muss ENT - Dr. Annalee Genta GI - Dr. Matthias Hughs Pulm - Dr. Shelle Iron Rheum - Dr. 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   Ears:  R ear normal and L ear  normal.   Lungs:  normal respiratory effort and normal breath sounds.   Heart:  normal rate, regular rhythm, and no gallop.   Neurologic:  cranial nerves II-XII intact, strength normal in all extremities, and gait normal.     Impression & Recommendations:  Problem # 1:  COUGH (ICD-786.2) Assessment Deteriorated CXR negative for acute airspace dz.  cough likely triggered by GERD. use PPI as directed  Orders: CXR- 2view (CXR)  Problem # 2:  HYPERTENSION (ICD-401.9) Assessment: Improved  Her updated medication list for this problem includes:    Losartan Potassium-hctz 50-12.5 Mg Tabs (Losartan potassium-hctz) ..... One by mouth once daily  BP today: 130/80 Prior BP: 130/84 (02/18/2010)  Labs Reviewed: K+: 4.4 (04/06/2010) Creat: : 1.0 (04/06/2010)   Chol: 155 (08/09/2009)   HDL: 47.00 (08/09/2009)   LDL: 86 (08/09/2009)   TG: 109.0 (08/09/2009)  Problem # 3:  BACK PAIN, LUMBAR, CHRONIC (ICD-724.2) now has some right sided LBP assoc with burning sensation reviewed stretching exercises prev MRI of L Spine in 2007 showed:  1.  Small to moderate sized central and right paracentral disc   protrusion at L4-5.  This may affect the right L-5 nerve root.   2.  Mild facet degenerative changes in the lower lumbar spine.  consider add lyrica   The following medications were removed from the medication list:    Aspir-low 81 Mg Tbec (Aspirin) .Marland Kitchen... 1 by mouth qd  Problem # 4:  RHINITIS, CHRONIC (ICD-472.0) Assessment: Improved pt able to stop OTC aftrin continue asteline NS as directed  Complete Medication List: 1)  Potassium Chloride Crys Cr 20 Meq Tbcr (Potassium chloride crys cr) .... Take 1 tablet by mouth once a day 2)  Allergy/congestion Relief 10-240 Mg Tb24 (Loratadine-pseudoephedrine) .... Take 1 tablet by mouth once a day 3)  Metformin Hcl 500 Mg Xr24h-tab (Metformin hcl) .Marland Kitchen.. 1 tab in am and 2 tabs in pm 4)  Triamcinolone Acetonide 0.1 % Crea (Triamcinolone acetonide)  .... Apply bid 5)  Levothyroxine Sodium 75 Mcg Tabs (Levothyroxine sodium) .... One by mouth once daily 6)  Benadryl 25 Mg Caps (Diphenhydramine hcl) .... By mouth at bedtime prn 7)  Losartan Potassium-hctz 50-12.5 Mg Tabs (Losartan potassium-hctz) .... One by mouth once daily 8)  Clotrimazole-betamethasone 1-0.05 % Crea (Clotrimazole-betamethasone) .... Apply two times a day 9)  Azelastine Hcl 137 Mcg/spray Soln (Azelastine hcl) .... 2 sprays two times a day 10)  Veramyst 27.5 Mcg/spray Susp (Fluticasone furoate) .... 2 sprays each nostril once daily 11)  Hydrocod Polst-chlorphen Polst 10-8 Mg/62ml Lqcr (Hydrocod polst-chlorphen polst) .... 5 ml by mouth two times a day as needed for cough 12)  Omeprazole-sodium Bicarbonate 40-1100 Mg Caps (Omeprazole-sodium bicarbonate) .... One by mouth once daily 13)  Estradiol 1 Mg Tabs (Estradiol) .... Take 1 tablet by mouth once a day  Patient Instructions: 1)  Please schedule a follow-up appointment in 4 months. 2)  BMP prior to visit, ICD-9: 401.9 3)  Lipid Panel prior to visit, ICD-9: 790.29 4)  TSH prior to visit, ICD-9: 244.9 5)  CBC w/ Diff prior to visit, ICD-9: 401.9 6)  HbgA1C prior to visit, ICD-9: 790.29 7)  Allergy panel:  477.9 8)  Please return for lab work one (1) week before your next appointment.  Prescriptions: OMEPRAZOLE-SODIUM BICARBONATE 40-1100 MG CAPS (OMEPRAZOLE-SODIUM BICARBONATE) one by mouth once daily  #90 x 1   Entered and Authorized by:   D. Thomos Lemons DO   Signed by:   D. Thomos Lemons DO on 04/11/2010   Method used:   Electronically to        [facility]* (retail)       [address]       [address] Shipping[address]mailing       [place], [place]  [address]       Ph: [phone]       Fax: [phone]   RxID:   [id number]    Orders Added: 1)  CXR- 2view [CXR] 2)  Est. Patient Level III [82993]   Immunization History:  Influenza Immunization History:    Influenza:  historical  (02/16/2010)   Immunization History:  Influenza Immunization History:    Influenza:  Historical (02/16/2010)   Current Allergies (reviewed today): ! ASA   Preventive Care Screening  Mammogram:    Date:  03/01/2010    Results:  normal   Pap Smear:    Date:  03/01/2010    Results:  normal

## 2010-06-10 ENCOUNTER — Encounter (INDEPENDENT_AMBULATORY_CARE_PROVIDER_SITE_OTHER): Payer: Self-pay | Admitting: *Deleted

## 2010-06-10 ENCOUNTER — Telehealth: Payer: Self-pay | Admitting: Internal Medicine

## 2010-06-10 ENCOUNTER — Other Ambulatory Visit: Payer: Self-pay | Admitting: Internal Medicine

## 2010-06-10 ENCOUNTER — Other Ambulatory Visit: Payer: Self-pay

## 2010-06-10 DIAGNOSIS — E039 Hypothyroidism, unspecified: Secondary | ICD-10-CM

## 2010-06-10 LAB — TSH: TSH: 0.94 u[IU]/mL (ref 0.35–5.50)

## 2010-06-20 ENCOUNTER — Encounter: Payer: Self-pay | Admitting: Internal Medicine

## 2010-06-21 NOTE — Progress Notes (Signed)
Summary: Lab Results  Phone Note Outgoing Call   Summary of Call: call pt -thyroid blood test normal.  continue same dose of thyroid medication.  repeat TSH in 6 months Initial call taken by: D. Thomos Lemons DO,  June 10, 2010 5:01 PM  Follow-up for Phone Call        call placed to patient at [phone], patient has been informed per Dr Artist Pais instructions. She request blood draw in [place]. Lab order for TSH entered for the week of August 20th for [facility] Follow-up by: Glendell Docker CMA,  June 13, 2010 9:36 AM    Prescriptions: LOSARTAN POTASSIUM-HCTZ 50-12.5 MG TABS (LOSARTAN POTASSIUM-HCTZ) one by mouth once daily  #90 x 2   Entered by:   Glendell Docker CMA   Authorized by:   D. Thomos Lemons DO   Signed by:   Glendell Docker CMA on 06/13/2010   Method used:   Electronically to        [facility]* (retail)       [address]       [address] [place] [address]       [place], [place]  [address]       Ph: [phone]       Fax: [phone]   RxID:   [id number] LEVOTHYROXINE SODIUM 75 MCG TABS (LEVOTHYROXINE SODIUM) one by mouth once daily  #30 x 5   Entered and Authorized by:   D. Thomos Lemons DO   Signed by:   D. Thomos Lemons DO on 06/10/2010   Method used:   Electronically to        [facility]* (retail)       [address]       [address] [place] [address]mail[address]       [place], [place]  [address]       Ph: [phone]       Fax: [phone]   RxID:   [id number]

## 2010-07-15 ENCOUNTER — Inpatient Hospital Stay (INDEPENDENT_AMBULATORY_CARE_PROVIDER_SITE_OTHER)
Admission: RE | Admit: 2010-07-15 | Discharge: 2010-07-15 | Disposition: A | Discharge status: Home / Self Care | Payer: Commercial Managed Care - PPO | Source: Ambulatory Visit | Attending: Family Medicine | Admitting: Family Medicine

## 2010-07-15 ENCOUNTER — Ambulatory Visit (INDEPENDENT_AMBULATORY_CARE_PROVIDER_SITE_OTHER): Payer: Commercial Managed Care - PPO

## 2010-07-15 DIAGNOSIS — IMO0002 Reserved for concepts with insufficient information to code with codable children: Secondary | ICD-10-CM

## 2010-08-08 ENCOUNTER — Other Ambulatory Visit (INDEPENDENT_AMBULATORY_CARE_PROVIDER_SITE_OTHER): Payer: Commercial Managed Care - PPO

## 2010-08-08 ENCOUNTER — Other Ambulatory Visit: Payer: Self-pay

## 2010-08-08 DIAGNOSIS — E039 Hypothyroidism, unspecified: Secondary | ICD-10-CM

## 2010-08-08 DIAGNOSIS — I10 Essential (primary) hypertension: Secondary | ICD-10-CM

## 2010-08-08 DIAGNOSIS — R7309 Other abnormal glucose: Secondary | ICD-10-CM

## 2010-08-08 DIAGNOSIS — J309 Allergic rhinitis, unspecified: Secondary | ICD-10-CM

## 2010-08-08 LAB — BASIC METABOLIC PANEL
BUN: 9 mg/dL (ref 6–23)
CO2: 27 mEq/L (ref 19–32)
Calcium: 9.4 mg/dL (ref 8.4–10.5)
Chloride: 105 mEq/L (ref 96–112)
Creatinine, Ser: 1 mg/dL (ref 0.4–1.2)
GFR: 76.45 mL/min (ref >60.00)
Glucose, Bld: 77 mg/dL (ref 70–99)
Potassium: 4.2 mEq/L (ref 3.5–5.1)
Sodium: 139 mEq/L (ref 135–145)

## 2010-08-08 LAB — HEMOGLOBIN A1C: Hgb A1c MFr Bld: 5.8 % (ref 4.6–6.5)

## 2010-08-08 LAB — CBC WITH DIFFERENTIAL/PLATELET
Basophils Absolute: 0 10*3/uL (ref 0.0–0.1)
Basophils Relative: 0.7 % (ref 0.0–3.0)
Eosinophils Absolute: 0.1 10*3/uL (ref 0.0–0.7)
Eosinophils Relative: 2.2 % (ref 0.0–5.0)
HCT: 39.3 % (ref 36.0–46.0)
Hemoglobin: 13.4 g/dL (ref 12.0–15.0)
Lymphocytes Relative: 48.3 % — ABNORMAL HIGH (ref 12.0–46.0)
Lymphs Abs: 2.1 10*3/uL (ref 0.7–4.0)
MCHC: 34.2 g/dL (ref 30.0–36.0)
MCV: 88.3 fl (ref 78.0–100.0)
Monocytes Absolute: 0.4 10*3/uL (ref 0.1–1.0)
Monocytes Relative: 8 % (ref 3.0–12.0)
Neutro Abs: 1.8 10*3/uL (ref 1.4–7.7)
Neutrophils Relative %: 40.8 % — ABNORMAL LOW (ref 43.0–77.0)
Platelets: 314 10*3/uL (ref 150.0–400.0)
RBC: 4.45 Mil/uL (ref 3.87–5.11)
RDW: 14.2 % (ref 11.5–14.6)
WBC: 4.4 10*3/uL — ABNORMAL LOW (ref 4.5–10.5)

## 2010-08-08 LAB — LIPID PANEL
Cholesterol: 188 mg/dL (ref 0–200)
HDL: 50.6 mg/dL (ref >39.00)
LDL Cholesterol: 106 mg/dL — ABNORMAL HIGH (ref 0–99)
Total CHOL/HDL Ratio: 4
Triglycerides: 156 mg/dL — ABNORMAL HIGH (ref 0.0–149.0)
VLDL: 31.2 mg/dL (ref 0.0–40.0)

## 2010-08-08 LAB — TSH: TSH: 0.76 u[IU]/mL (ref 0.35–5.50)

## 2010-08-09 LAB — ALLERGY FULL PROFILE
Allergen, D pternoyssinus,d7: <0.35 kU/L (ref <0.35)
Allergen,Goose feathers, e70: <0.35 kU/L (ref <0.35)
Alternaria Alternata: <0.35 kU/L (ref <0.35)
Aspergillus fumigatus, IgG: <0.35 kU/L (ref <0.35)
Bahia Grass: <0.35 kU/L (ref <0.35)
Bermuda Grass: <0.35 kU/L (ref <0.35)
Box Elder IgE: <0.35 kU/L (ref <0.35)
Candida Albicans: <0.35 kU/L (ref <0.35)
Cat Dander: <0.35 kU/L (ref <0.35)
Common Ragweed: <0.35 kU/L (ref <0.35)
Curvularia lunata: <0.35 kU/L (ref <0.35)
D. farinae: <0.35 kU/L (ref <0.35)
Dog Dander: <0.35 kU/L (ref <0.35)
Elm IgE: <0.35 kU/L (ref <0.35)
Fescue: <0.35 kU/L (ref <0.35)
G005 Rye, Perennial: <0.35 kU/L (ref <0.35)
G009 Red Top: <0.35 kU/L (ref <0.35)
Goldenrod: <0.35 kU/L (ref <0.35)
Helminthosporium halodes: <0.35 kU/L (ref <0.35)
House Dust Hollister: <0.35 kU/L (ref <0.35)
IgE (Immunoglobulin E), Serum: 6.4 IU/mL (ref 0.0–180.0)
Lamb's Quarters: <0.35 kU/L (ref <0.35)
Oak: <0.35 kU/L (ref <0.35)
Plantain: <0.35 kU/L (ref <0.35)
Stemphylium Botryosum: <0.35 kU/L (ref <0.35)
Sycamore Tree: <0.35 kU/L (ref <0.35)
Timothy Grass: <0.35 kU/L (ref <0.35)

## 2010-08-15 ENCOUNTER — Telehealth: Payer: Self-pay | Admitting: *Deleted

## 2010-08-15 ENCOUNTER — Encounter: Payer: Self-pay | Admitting: Internal Medicine

## 2010-08-15 ENCOUNTER — Ambulatory Visit (INDEPENDENT_AMBULATORY_CARE_PROVIDER_SITE_OTHER): Payer: Commercial Managed Care - PPO | Admitting: Internal Medicine

## 2010-08-15 DIAGNOSIS — E119 Type 2 diabetes mellitus without complications: Secondary | ICD-10-CM

## 2010-08-15 DIAGNOSIS — R7309 Other abnormal glucose: Secondary | ICD-10-CM

## 2010-08-15 DIAGNOSIS — I10 Essential (primary) hypertension: Secondary | ICD-10-CM

## 2010-08-15 DIAGNOSIS — R209 Unspecified disturbances of skin sensation: Secondary | ICD-10-CM

## 2010-08-15 DIAGNOSIS — R202 Paresthesia of skin: Secondary | ICD-10-CM

## 2010-08-15 DIAGNOSIS — E039 Hypothyroidism, unspecified: Secondary | ICD-10-CM

## 2010-08-15 MED ORDER — METFORMIN HCL 500 MG PO TABS: ORAL_TABLET | ORAL | Status: DC

## 2010-08-15 NOTE — Telephone Encounter (Signed)
Lanora Manis from [facility] called and left voice message requesting clarification on patients medication.She states patient has taken  Er in the past with the same instructions. She wanted to know if the Rx is for immediate release.

## 2010-08-15 NOTE — Progress Notes (Signed)
Subjective:    Patient ID: [name], female    DOB: 1961-01-02, 50 y.o.   MRN: [id number]  HPI  Pt notes chronic tingling of her lips.  It has been happening on and off for years.  She recently noticed after donating platelets.  Pt denies diploplia.    She notes episode of fall - 07/16/2010.  It occurred while she was trying to clean.  She fell off stool.  She was using stool as step.  Fell backwards landing on her coccyx.  Seen at urgent care.  X rays reported normal  DM II borderline - stable.  Good dietary compliance.  Htn - stable.   Past Medical History  Diagnosis Date  . GERD (gastroesophageal reflux disease)   . Hypertension   . Thyroid disease     hypothyroidism  . Anxiety   . Depression   . Hyperlipidemia   . Chronic rhinosinusitis   . OSA (obstructive sleep apnea)   . Obesity   . Colon polyps   . Fibroid, uterine   . Hoarseness     History   Social History  . Marital Status: Single    Spouse Name: N/A    Number of Children: N/A  . Years of Education: N/A   Occupational History  . Not on file.   Social History Main Topics  . Smoking status: Former Smoker    Quit date: 04/25/2003  . Smokeless tobacco: Not on file   Comment: smoked since age 77  . Alcohol Use: No  . Drug Use: Not on file  . Sexually Active: Not on file   Other Topics Concern  . Not on file   Social History Narrative   Endo-- Dr [facility]--Dr BucciniPulm--Dr ClanceRheum--Dr Kellie Simmering    Past Surgical History  Procedure Date  . Cholecystectomy   . Abdominal hysterectomy     Family History  Problem Relation Age of Onset  . Alcohol abuse Father   . Cancer Father     renal and colon  . Hyperlipidemia Father   . Hypertension Father   . Diabetes      Allergies  Allergen Reactions  . Aspirin     REACTION: Upset stomach    Current Outpatient Prescriptions on File Prior to Visit  Medication Sig Dispense Refill  . azelastine (ASTELIN) 137 MCG/SPRAY  nasal spray 2 sprays by Nasal route 2 (two) times daily. Use in each nostril as directed       . clotrimazole (LOTRIMIN) 1 % cream Apply topically 2 (two) times daily.        . diphenhydrAMINE (BENADRYL) 25 MG tablet Take 25 mg by mouth at bedtime as needed.        Marland Kitchen estradiol (ESTRACE) 1 MG tablet Take 1 mg by mouth daily.        Marland Kitchen levothyroxine (SYNTHROID, LEVOTHROID) 75 MCG tablet Take 75 mcg by mouth daily.        Marland Kitchen loratadine-pseudoephedrine (ALLERGY/CONGESTION RELIEF) 10-240 MG per 24 hr tablet Take 1 tablet by mouth daily.        Marland Kitchen losartan-hydrochlorothiazide (HYZAAR) 50-12.5 MG per tablet Take 1 tablet by mouth daily.        . metFORMIN (GLUCOPHAGE) 500 MG tablet Take 500 mg by mouth. Take 1 tablet in the morning and 2 tablets in the evening.       Marland Kitchen omeprazole-sodium bicarbonate (ZEGERID) 40-1100 MG per capsule Take 1 capsule by mouth daily.        . potassium chloride  SA (K-DUR,KLOR-CON) 20 MEQ tablet Take 20 mEq by mouth daily.        Marland Kitchen triamcinolone (KENALOG) 0.1 % cream Apply topically 2 (two) times daily.        . chlorpheniramine-HYDROcodone (TUSSIONEX) 10-8 MG/5ML LQCR Take 5 mLs by mouth 2 (two) times daily as needed. For cough.       . fluticasone (VERAMYST) 27.5 MCG/SPRAY nasal spray 2 sprays by Nasal route daily.          BP 130/80  Pulse 94  Temp 98.6 F (37 C)  Resp 20  Ht 5\' 4"  (1.626 m)  Wt 226 lb (102.513 kg)  BMI 38.79 kg/m2     Review of Systems Mentions pt has trouble seeing objects on right side - attributed to lazy eye and astigmatism,  Negative for chest pains    Objective:   Physical Exam     Constitutional: overweight appearing.  No distress Head: Normocephalic and atraumatic.  Mouth/Throat: Oropharynx is clear and moist.  Eyes: Conjunctivae are normal. Pupils are equal, round, and reactive to light.  Neck: Normal range of motion. Neck supple. No thyromegaly present.       No carotid bruit  Cardiovascular: Normal rate, regular rhythm and  normal heart sounds.  Exam reveals no gallop and no friction rub.   No murmur heard. Pulmonary/Chest: Effort normal and breath sounds normal.  No wheezes. No rales.  Neurological: Alert. No cranial nerve deficit.  Skin: Skin is warm and dry.       Assessment & Plan:

## 2010-08-15 NOTE — Patient Instructions (Signed)
Please complete the following lab tests before your next follow up appointment: BMET - 401.9 A1c - 250.00 

## 2010-08-15 NOTE — Telephone Encounter (Signed)
No, please change prescription to ER ( change is medication list)

## 2010-08-16 MED ORDER — METFORMIN HCL ER 500 MG PO TB24: 500.0000 mg | ORAL_TABLET | Freq: Three times a day (TID) | ORAL | Status: DC

## 2010-08-16 NOTE — Telephone Encounter (Signed)
Call was placed to [facility], spoke Dierks, she was advised to keep patient Rx as previously prescribed with the extended release.  Rx has been changed to reflect the correct medication

## 2010-08-31 DIAGNOSIS — R202 Paresthesia of skin: Secondary | ICD-10-CM | POA: Insufficient documentation

## 2010-08-31 NOTE — Assessment & Plan Note (Signed)
Pt with unexplained paraesthesia raising concern for possible MS.  Refer to neurologist for additional evaluation.[name]

## 2010-08-31 NOTE — Assessment & Plan Note (Signed)
Stable.  No change in medication dose  Lab Results  Component Value Date   TSH 0.76 08/08/2010

## 2010-08-31 NOTE — Assessment & Plan Note (Signed)
Stable.  Continue to maintain lifestyle changes  Lab Results  Component Value Date   HGBA1C 5.8 08/08/2010

## 2010-08-31 NOTE — Assessment & Plan Note (Signed)
Controlled.  Continue current medication regimen.  BP: 130/80 mmHg  Lab Results  Component Value Date   CREATININE 1.0 08/08/2010

## 2010-09-06 NOTE — Assessment & Plan Note (Signed)
[facility]                             PULMONARY OFFICE NOTE   [name], [name]                     MRN:          [id number]  DATE:11/05/2006                            DOB:          [birth year]    I received a phone call from [name] complaining about nasal  congestion since she had her CPAP pressure increased to 16. She says  that she has tried adjusting the temperature setting on her humidifier.  She is currently using a nasal mask. She will occasionally get dryness  in her mouth which appears to be worse when she increases the  temperature on her humidifier but improves when she decreases it. She is  not having any nose bleeds. She says she feels like she is also having  some postnasal drip. She has also been having continued problems with  her breathing as well as feeling achy, although she denies having any  fevers. What I have advised for her to do is try using nasal irrigation  as well as saline nasal drops and to continue to adjust the temperature  setting on her humidifier. She had tried steroid nasal sprays before,  but she thinks she may have gotten a headache from those, although she  is not sure if it was related to the nose spray or the pressure setting  on her CPAP machine. I advised her to monitor her symptoms for the next  2 to 3 days. If she is still having persistent difficultly then we may  need to reinitiate her on steroid nose sprays.     Coralyn Helling, MD  Electronically Signed    VS/MedQ  DD: 11/05/2006  DT: 11/06/2006  Job #: [id number]   cc:   Barbaraann Share, MD,FCCP

## 2010-09-06 NOTE — Op Note (Signed)
[name], [name]              ACCOUNT NO.:  [id number]   MEDICAL RECORD NO.:  [id number]          PATIENT TYPE:  AMB   LOCATION:  ENDO                         FACILITY:  [facility], [place]   PHYSICIAN:  Bernette Redbird, M.D.   DATE OF BIRTH:  [birth year]   DATE OF PROCEDURE:  03/12/2007  DATE OF DISCHARGE:                               OPERATIVE REPORT   PROCEDURE:  Colonoscopy with biopsy.   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   FINDINGS:  Diminutive polyps in the proximal rectum suggestive of  hyperplastic polyps.   PROCEDURE:  The nature, purpose, risks of the procedure were familiar to  the patient from prior examination and she provided written consent.  Sedation was Phenergan 12.5 mg, fentanyl 55 mcg and Versed 5 mg IV  without arrhythmias or desaturation.  The Pentax adult video colonoscope  was readily advanced to the terminal ileum, which had a normal  appearance, and pullback was then performed.  The quality of the prep  was very good and it is felt that all areas were well-seen.   In the proximal rectum at about 18 cm I encountered approximately six  diminutive sessile, pale hyperplastic-appearing polyps, which were cold  biopsied.  No large polyps, cancer, colitis, vascular malformations or  diverticular disease were observed.  Retroflexion in the rectum was  unremarkable, as was reinspection of the rectum except for the above-  mentioned polyps.   The patient tolerated the procedure well and there were no apparent  complications.   IMPRESSION:  1. Multiple diminutive rectal polyps, suggestive of hyperplastic      polyps, pathology pending (211.4).  2. Prior history of colonic adenomas.  3. Family history of colon cancer.   PLAN:  Await pathology results, follow-up colonoscopy in 3-5 years  depending on the histologic findings,  considering the prior history of  colonic adenomas and the family history of colon cancer.           ______________________________  Bernette Redbird, M.D.     RB/MEDQ  D:  03/12/2007  T:  03/12/2007  Job:  [id number]   cc:   Georgann Housekeeper, MD  Fax: [phone]

## 2010-09-09 NOTE — Discharge Summary (Signed)
NAME:  JETTE, LEWAN                        ACCOUNT NO.:  [id number]   MEDICAL RECORD NO.:  [id number]                   PATIENT TYPE:  INP   LOCATION:  9305                                 FACILITY:  [facility]   PHYSICIAN:  Duke Salvia. Marcelle Overlie, M.D.            DATE OF BIRTH:  26-Aug-1960   DATE OF ADMISSION:  12/20/2003  DATE OF DISCHARGE:  12/23/2003                                 DISCHARGE SUMMARY   DISCHARGE DIAGNOSES:  1.  Postoperative bilateral salpingo-oophorectomy with ileus, fever, and      elevated white count.  2.  Presumed postoperative peritonitis versus diverticulitis.   SUMMARY OF THE HISTORY AND PHYSICAL EXAMINATION:  Please see admission H&P  for details.  Briefly, a 50 year old status post laparotomy with BSO on  December 11, 2003.  She was discharged on August 21.  The day of admission she  experienced increased lower abdominal left lower quadrant pain.  On  evaluation was noted to have a temperature of 101.9 with abdominal film that  showed ileus, no obstruction, and a WBC initially 17.8.   HOSPITAL COURSE:  On admission she was kept n.p.o., IV fluids were started,  blood cultures were done, and she was started on IV Unasyn.  The following  a.m. she had positive BM and was passing gas, stated that her abdominal pain  was improved, with a temperature of 100.  WBC had decreased to 13,000;  hemoglobin 9.4.  Her Unasyn was continued.  She was started on clear liquids  at that point.  By the following day - December 22, 2003 - temperature maximum  was 100.5.  Her diet was advanced to regular and she continued to make  clinical improvement.  The p.m. of August 30 had a temperature of 102 that  promptly came back down in the normal range, but because of that CT was  ordered for the following morning.   Actually, the a.m. of August 31 she was afebrile.  Her abdominal exam was  unremarkable.  She was tolerating a regular diet and WBC had decreased to  11,000.  CT showed some  nonspecific changes; specifically, multiple fluid  collections along the mesenteric fat but no dilated bowel loops or mass  noted.  The detailed CT report was still pending.  On my exam at 5 p.m. on  August 31 she was requesting discharge, was still afebrile, her pain was  much improved, and her abdominal exam was unremarkable.   OTHER LABORATORY DATA:  CBC on discharge:  WBC 11.1, hemoglobin 9.2,  hematocrit 26.8.  Blood cultures were no growth.   DISPOSITION:  The patient was discharged on Augmentin 875 p.o. b.i.d. x5,  will return to my office in 2 days.  Advised to report any fever over 101,  increased abdominal distention, persistent nausea or vomiting.  She has  Vicodin at home for pain.   CONDITION:  Good.   ACTIVITY:  Graded increase.  Richard M. Marcelle Overlie, M.D.   RMH/MEDQ  D:  12/23/2003  T:  12/24/2003  Job:  [id number]

## 2010-09-09 NOTE — Op Note (Signed)
[facility]  Patient:    [name], [name]                     MRN: [id number] Proc. Date: 11/15/99 Adm. Date:  [id number] Attending:  Rhina Brackett                           Operative Report  PREOPERATIVE DIAGNOSES:       1. Chronic pelvic pain.                               2. Leiomyomata.                               3. Dysmenorrhea.                               4. Menorrhagia.  POSTOPERATIVE DIAGNOSES:      1. Chronic pelvic pain.                               2. Leiomyomata.                               3. Dysmenorrhea.                               4. Menorrhagia.  PROCEDURE:                    Total abdominal hysterectomy.  SURGEON:                      Duke Salvia. Marcelle Overlie, M.D.  ASSISTANT:                    Juluis Mire, M.D.  ANESTHESIA:                   General endotracheal.  COMPLICATIONS:                None.  DRAINS:                       Foley catheter.  ESTIMATED BLOOD LOSS:         150.  PROCEDURE AND FINDINGS:       The patient sent to the operating room after an adequate level of general endotracheal anesthesia was obtained.  With the patient supine, the abdomen prepped and draped in the usual manner for sterile abdominal procedures.  Foley catheter positioned draining clear liquid. transverse incision made two fingerbreadths from the symphysis, carried down into the fascia, which was incised and extended transversely.  Rectus muscle divided in the midline.  Peritoneum entered superiorly without incident and extended in a vertical manner.  The upper abdomen was explored and found to be normal.  Balfour retractor was positioned.  Bowels packed superiorly out of the field.  The cecum and appendix appeared to be normal.  The uterus itself had numerous small fibroids.  The ovaries were normal.  The cul-de-sac was free and clear.  Specifically, there was no evidence of endometriosis.  After these  findings were  noted, long Kelly clamps were then placed at each uteroovarian pedicle starting on the left.  The round ligament was clamped, divided, and suture ligated with 0 Dexon and held.  Peritoneum carried around to the midline with the exact same repeated on the opposite side.  The uteroovarian pedicle was then clamped, divided, first free-tie, followed by suture ligature of 0 Dexon conserving both ovaries.  The bladder was advanced inferiorly with sharp and blunt dissection.  The uterine vasculature pedicles were then skeletonized, clamped, divided, and suture ligated with 0 Dexon suture.  In sequential manner, staying close to the uterus and ensuring that the bladder was well below the cardinal ligament, uterosacral ligament and cervicovaginal pedicles were clamped, divided, and suture ligated with 0 Dexon, and the specimen was excised.  The vaginal cuff closed with a running-locking 2-0 Dexon suture.  The pelvis was irrigated with saline and noted to be hemostatic.  Prior to closure, sponge, needle and instrument counts correct x 2.  Rectus muscles reapproximated with 3-0 Dexon interrupted sutures.  Fascia closed from laterally to midline on either side with 0 Dexon running suture.  Subcutaneous fat was hemostatic.  Clips and Steri-Strips used on the skin.  She tolerated this well and went to recovery room in good condition. DD:  11/15/99 TD:  11/17/99 Job: [id number]

## 2010-09-09 NOTE — Op Note (Signed)
NAME:  [name], [name]                        ACCOUNT NO.:  [id number]   MEDICAL RECORD NO.:  [id number]                   PATIENT TYPE:  AMB   LOCATION:  ENDO                                 FACILITY:  [facility]   PHYSICIAN:  Bernette Redbird, M.D.                DATE OF BIRTH:  02/16/1961   DATE OF PROCEDURE:  12/16/2002  DATE OF DISCHARGE:                                 OPERATIVE REPORT   PROCEDURE:  Colonoscopy with biopsies.   INDICATIONS FOR PROCEDURE:  This is a 50 year old female  with a strong  family history of colon cancer and a prior history of some sort of polyps  having been removed about 10 years ago by other  practitioners, histology  not clear.   FINDINGS:  Possible mucosal abnormality near the splenic flexure (see  details below). Small rectosigmoid polyps.   DESCRIPTION OF PROCEDURE:  The nature, purpose and risks of the procedure  were familiar to the patient from prior examination  She provided written  consent. Sedation for this procedure and the upper endoscopy which preceded  it totalled Fentanyl 100 micrograms and Versed 10 mg IV without arrhythmias  or desaturation.   The Olympus adult video colonoscope was advanced around the colon to the  terminal ileum which had a normal appearance and pullback was then  performed. The quality of the prep was very good and it was felt that all  areas were well seen.   Overall this was a fairly normal examination, but several minor  abnormalities were noted.  In the region of the splenic flexure at about 55 cm, there was a small,  prominent fold with a somewhat circular configuration and a minimally  umbilicated erythematous patch. Morphologically this looked slightly like a  leiomyoma but it was very soft. In any event, we biopsied it several times,  including trying to get some tissue  from the central erythematous, perhaps  slightly eroded area.   In the rectosigmoid region at 32 cm and again at 25 cm, I  encountered  several small sessile polyps, each measuring 2 to 3 cm across, removed by  cold biopsy technique. No  large polyps, cancer, colitis, vascular  malformations or diverticulosis were noted. Retroflexion in the rectum and  reinspection of the rectosigmoid was unremarkable.   The patient tolerated the procedure well. There were no apparent  complications.    IMPRESSION:  1. Possible lesion near the splenic flexure, doubtful clinical significance,     pathology pending.  2. Diminutive polyps.   PLAN:  Await pathology results with further management  to depend on the  histologic findings.                                               Bernette Redbird, M.D.  RB/MEDQ  D:  12/16/2002  T:  12/16/2002  Job:  [id number]   cc:   Marcene Duos, M.D.  [address] [place]  [place] [address]  Fax: [phone]   Duke Salvia. Marcelle Overlie, M.D.  [address], [address]  [place] [address]  Fax: [phone]

## 2010-09-09 NOTE — H&P (Signed)
NAME:  [name], MICKEL                        ACCOUNT NO.:  [id number]   MEDICAL RECORD NO.:  [id number]                   PATIENT TYPE:  AMB   LOCATION:  SDC                                  FACILITY:  [facility]   PHYSICIAN:  Duke Salvia. Marcelle Overlie, M.D.            DATE OF BIRTH:  May 18, 1960   DATE OF ADMISSION:  12/11/2003  DATE OF DISCHARGE:                                HISTORY & PHYSICAL   CHIEF COMPLAINT:  Pelvic pain, ovarian cyst.   HISTORY OF PRESENT ILLNESS:  [age]-year-old G0, P0, this patient has had  a prior TAH in 2001, at the time her ovaries were noted to be normal.   Due to continued problems with pelvic pain ultrasound was done in our office  that showed an ovarian cyst.  This was originally noted October 08, 2002, it  was a simple cyst 3.2 x 3.7 x 2.5 with no free fluid noted.  Followup  ultrasound October 29, 2003 showed a 6.0 x 4.9 x 4.8 cm cyst with small areas of  septation and probable hemorrhagic cyst, there was no free fluid noted and  her CA 125 on November 20, 2003 was normal at 21.4.  The opposite ovary was  unremarkable.  We discussed conservative treatment including cystectomy; her  preference is for definitive BSO.  The laparoscopic BSO procedure including  possibility of laparotomy for BSO reviewed, risks relative to bleeding,  infection, transfusion, adjacent organ injury all reviewed with her which  she understands and accepts.  Possible need for ERT postop reviewed with her  also.   CURRENT MEDICATIONS:  Toprol XL, hydrochlorothiazide.   REVIEW OF SYSTEMS:  Hypertension and elevated cholesterol.   LABORATORY WORK:  Lab work recently September 30, 2003 showed normal thyroid  profile, E2 level was normal on July 16, 2003 with an [facility] of 7.2.   ALLERGIES:  ERYTHROMYCIN and AMOXICILLIN.   OTHER SURGERIES:  She has had laparoscopy x3 along with hysterectomy, benign  breast biopsy, D&C x2 in the past, colonoscopy and cholecystectomy, excision  of a lipoma in 1992.   HABITS:  She is still smoking one half PPD.   FAMILY HISTORY:  Mother, father, and grandmother all with hypertension.   PHYSICAL EXAMINATION:  VITAL SIGNS:  Temperature 98.2, blood pressure  132/98.  HEENT:  Unremarkable.  NECK:  Supple without mass.  LUNGS:  Clear.  CARDIOVASCULAR:  Regular rate and rhythm without murmurs, rubs, or gallops.  BREASTS:  Without masses.  ABDOMEN:  Soft, flat, and nontender.  PELVIC:  Normal external genitalia.  Vagina and cervix clear.  She had some  tenderness on exam but the cyst was not easily delineated due to her weight  of 194.  EXTREMITIES/NEUROLOGIC:  Exams unremarkable.   IMPRESSION:  Pelvic pain, ovarian cyst.   PLAN:  Laparoscopic BSO, possible open BSO, procedure and risks reviewed as  above.  Richard M. Marcelle Overlie, M.D.    RMH/MEDQ  D:  11/30/2003  T:  11/30/2003  Job:  [id number]

## 2010-09-09 NOTE — Procedures (Signed)
NAMEVIHANA, KYDD NO.:  [id number]   MEDICAL RECORD NO.:  [id number]          PATIENT TYPE:  OUT   LOCATION:  SLEEP CENTER                 FACILITY:  [facility]   PHYSICIAN:  Barbaraann Share, MD,FCCPDATE OF BIRTH:  12-[birth year]-60   DATE OF STUDY:  08/12/2006                            NOCTURNAL POLYSOMNOGRAM   REFERRING PHYSICIAN:  Barbaraann Share, MD,FCCP   INDICATION FOR STUDY:  Hypersomnia with sleep apnea.   EPWORTH SLEEPINESS SCORE:  Two.   SLEEP ARCHITECTURE:  The patient had a total sleep time of 199 minutes  with only 34 minutes of REM and never achieved slow wave sleep.  Sleep  onset latency was prolonged at 88 minutes, and REM onset was normal.  Sleep efficiency was extremely poor at 49%.   RESPIRATORY DATA:  The patient was found to have 17 hypopnea's and no  apnea's for an apnea/hypopnea index of 5 events per hour. The events  were not positional but there was moderate to loud snoring noted  throughout.   OXYGEN DATA:  There was O2 desaturation as low as 85% with the patient's  obstructive events.   CARDIAC DATA:  No clinically significant cardiac arrhythmias were noted.   MOVEMENT-PARASOMNIA:  None.   IMPRESSIONS-RECOMMENDATIONS:  1. Very mild obstructive sleep apnea/hypopnea syndrome with an      apnea/hypopnea index of 5 events per hour and O2 desaturation as      low as 85%.  It should be noted, the patient had a death in the family just prior to  the sleep study and only had a total sleep time of 199 minutes with  significantly decreased slow wave sleep in REM.  This may cause an  underestimation of her actual severity of her sleep apnea.  Treatment  for this degree of sleep apnea can include weight loss alone if  applicable, upper airway surgery or appliance, and also CPAP.  Clinical  correlation is suggested.      Barbaraann Share, MD,FCCP  Diplomate, American Board of Sleep  Medicine  Electronically Signed    [facility]/MEDQ  D:   08/21/2006 17:02:32  T:  08/21/2006 22:52:53  Job:  [id number]

## 2010-09-09 NOTE — Op Note (Signed)
NAME:  [name], [name]                        ACCOUNT NO.:  [id number]   MEDICAL RECORD NO.:  [id number]                   PATIENT TYPE:  AMB   LOCATION:  SDC                                  FACILITY:  [facility]   PHYSICIAN:  Duke Salvia. Marcelle Overlie, M.D.            DATE OF BIRTH:  1960/08/22   DATE OF PROCEDURE:  12/11/2003  DATE OF DISCHARGE:                                 OPERATIVE REPORT   PREOPERATIVE DIAGNOSES:  Ovarian cyst, chronic pelvic pain.   POSTOPERATIVE DIAGNOSES:  Dense left adnexal adhesions, right adnexal  adhesions.   PROCEDURE:  Diagnostic laparoscopy followed by a laparotomy with lysis of  adhesions and BSO.   SURGEON:  Duke Salvia. Marcelle Overlie, M.D.   ANESTHESIA:  General endotracheal.   COMPLICATIONS:  None.   DRAINS:  Foley catheter.   ESTIMATED BLOOD LOSS:  75 mL.   DESCRIPTION OF PROCEDURE:  The patient was taken to the operating room after  an adequate level of general endotracheal anesthesia was obtained with the  patient's legs in stirrups. The abdomen, perineum and vagina were prepped  and draped in the usual manner for a laparoscopy. The bladder was drained,  EUA carried out, no masses were noted. Attention directed to the abdomen  where the subumbilical area was infiltrated with 0.5% Marcaine plain. A  small incision was made, the Veress needle was introduced without  difficulty, its intraabdominal position was verified by __________ and water  testing.  After a 2 liter pneumoperitoneum was then created, the  laparoscopic trocar and sleeve were then introduced without difficulty.  Three fingerbreadths above the symphysis of the midline, a second 5 mm  trocar was placed under direct visualization without complications. The  patient then placed in Trendelenburg and the right ovary was noted to be  normal size with some minimal adhesions. The left ovary was densely adherent  to the pelvic sidewall and to the colon. A decision made to proceed with  open BSO.  The patient's legs were placed supine, the Foley catheter was  positioned.  A Pfannenstiel incision was made through the old scar which was  carried down to the fascia. This was incised and extended transversely. The  rectus muscle was divided in the midline, peritoneum entered superiorly  without incident and extended in a vertical manner.  The O'Connor-O'Sullivan  retractor was positioned, bowels packed superiorly out of the field, patient  placed in Trendelenburg.  Starting on the left, the ovary was grasped with a  Babcock clamp and placed on tension toward the midline. The left round  ligament was clamped with a Kelly clamp and divided. The retroperitoneal  space on that side was developed. Using sharp and blunt dissection, the  adhesions were dissected carefully from the side wall and from the colon  surface freeing up the ovary.  When this was completed, the retroperitoneal  space was developed, the course of the ureter was noted well below. The  left  infundibulopelvic pedicle was then isolated, doubly free tied with #0 Dexon  suture and cut. The remainder of the ovary was dissected free by placement  of a clamp, cut and suture technique. This area was irrigated and inspected,  noted to be hemostatic, again the course of the ureter was well below the  operative site. The exact same procedure repeated on the right side as she  had requested BSO if open surgery was indicated.  After the right side was  dissected free, this was irrigated, both sides were inspected and noted to  be hemostatic.  Prior to closure, sponge, needle and instrument counts were  reported as correct x2.  The rectus muscle  was reapproximated with 2-0 Dexon interrupted sutures, fascia closed from  laterally to midline on either side with a #0 PDS suture. Prior to closure,  subfascial and subcutaneous pain pump catheters were placed, skin closed  with Steri-Strips and staples.  She tolerated this well and went to  the  recovery room in good condition.                                               Richard M. Marcelle Overlie, M.D.    RMH/MEDQ  D:  12/11/2003  T:  12/11/2003  Job:  [id number]

## 2010-09-09 NOTE — Discharge Summary (Signed)
NAME:  [name], MIDGLEY                        ACCOUNT NO.:  [id number]   MEDICAL RECORD NO.:  [id number]                   PATIENT TYPE:  INP   LOCATION:  9307                                 [facility]ACILITY:  [facility]   PHYSICIAN:  Duke Salvia. Marcelle Overlie, M.D.            DATE OF BIRTH:  1961-02-01   DATE OF ADMISSION:  12/11/2003  DATE OF DISCHARGE:  12/13/2003                                 DISCHARGE SUMMARY   DISCHARGE DIAGNOSES:  1. Chronic pelvic pain.  2. Dense adnexal adhesions.  3. Laparoscopy followed by laparotomy with bilateral salpingo-oophorectomy.   HISTORY OF PRESENT ILLNESS:  Please see admission H&P for details.  Briefly,  this is a 50 year old G0, P0, prior TH in 2001.  She continues to have  chronic pelvic pain in particular on the left side.  She presents for  laparoscopy with BSO.   HOSPITAL COURSE:  On December 11, 2003, under general anesthesia, the patient  underwent laparoscopy followed by a laparotomy with BSO.  The left ovary in  particular was densely adherent to the left pelvic sidewall.  On the first  postoperative day, her hemoglobin was 11.5.  She was afebrile.  Her diet was  slowly advanced.  By the second day, December 13, 2003, she was afebrile.  Hemoglobin 9.0.  Her white count was normal.  She was tolerating a regular  diet and had established normal bowel and urinary function and was ready for  discharge.   LABORATORY DATA:  Preoperative hemoglobin 15.2, WBC 8.0 on December 17, 2003.  On postoperative #2, hemoglobin 9.0, WBC 8.3. C-MET was normal on admission.  Urinalysis negative.  Blood type A positive.  Antibody screen was negative.  Pathology report is still pending.   DISCHARGE MEDICATIONS:  1. The patient is discharged on Tylox p.r.n.  2. Hemocyte once daily.   DISCHARGE INSTRUCTIONS:  1. She will return to the office in two days to have the pain pump catheter     and her clips removed.  2. She was advised to report any incisional redness or drainage,  increased     pain, bleeding or fever over 101.  3. She was given specific instructions regarding diet, sex and exercise.   DISCHARGE CONDITION:  Good.   ACTIVITY:  Good.                                               Richard M. Marcelle Overlie, M.D.    RMH/MEDQ  D:  12/13/2003  T:  12/13/2003  Job:  [id number]

## 2010-09-09 NOTE — Op Note (Signed)
NAME:  [name], [name]                        ACCOUNT NO.:  [id number]   MEDICAL RECORD NO.:  [id number]                   PATIENT TYPE:  AMB   LOCATION:  ENDO                                 FACILITY:  [facility]   PHYSICIAN:  Bernette Redbird, M.D.                DATE OF BIRTH:  1961/03/05   DATE OF PROCEDURE:  12/16/2002  DATE OF DISCHARGE:                                 OPERATIVE REPORT   PROCEDURE PERFORMED:  Upper endoscopy.   ENDOSCOPIST:  Florencia Reasons, M.D.   INDICATIONS FOR PROCEDURE:  The patient is a 50 year old female with  nocturnal cough upon assuming recumbency, but intolerant to outpatient trial  of proton pump inhibitor therapy.  She also has had xiphoid area discomfort.   FINDINGS:  Probable boggy larynx.   DESCRIPTION OF PROCEDURE:  The patient provided written consent for the  procedure.  Sedation was fentanyl 80 mcg and Versed 8 mg IV without  arrhythmias or  desaturation.  The Olympus small caliber adult video  endoscope was passed under direct vision.   The arytenoid cartilages appeared boggy and edematous and perhaps even  slightly inflamed or eroded.  The vocal cords themselves looked okay to me.   There appeared to be some thickening of the posterior commissure of the  larynx.   The scope was passed easily under direct vision into the esophagus.  The  esophageal mucosa was entirely normal.  There was no sign of reflux  esophagitis, Barrett's esophagus, varices, infection or neoplasia.  No  reflux was observed.  No ring, stricture or hiatal hernia was appreciated.   The stomach contained a fairly large clear residual without any bile, blood  or coffee ground material.  The gastric mucosa was normal, with just a  little bit of patchy erythema in the antrum, not considered clinically  significant.  No gastritis, erosions, ulcers, polyps or masses were observed  and a retroflex view of the proximal stomach showed an intact diaphragmatic  hiatus  without evidence of a hiatal hernia.   The pylorus, duodenal bulb, second and third portions of the duodenum all  looked normal.   No biopsies were obtained.  The patient tolerated the procedure well and  there were no apparent complications.    IMPRESSION:  Possible edema and/or chronic inflammatory changes of the  laryngeal tissues, conceivably due to reflux although no other markers of  reflux were observed during this procedure, such as esophagitis or a hiatal  hernia.   PLAN:  Continue Pepcid AC for now.  Consider ENT evaluation and/or 24-hour  pH monitoring.                                                Bernette Redbird, M.D.    RB/MEDQ  D:  12/16/2002  T:  12/16/2002  Job:  [id number]   cc:   Marcene Duos, M.D.  [address] [place]  [place] [address]  Fax: [phone]   Duke Salvia. Marcelle Overlie, M.D.  [address], [address]  [place] [address]  Fax: [phone]

## 2010-09-09 NOTE — Assessment & Plan Note (Signed)
[facility]                               PULMONARY OFFICE NOTE   BLANCH, STANG                     MRN:          [id number]  DATE:11/27/2005                            DOB:          Sep 10, 1960    REFERRING PHYSICIAN:  Vesta Mixer, MD.   PRIMARY CARE PHYSICIAN:  Marcene Duos, MD.   HISTORY OF PRESENT ILLNESS:  Patient is a very pleasant 50 year old black  female who I have been asked to see for possible obstructive sleep apnea.  Patient underwent nocturnal polysomnography in 1999 as well as in 2000 and  by her history, was found to have loud snoring but no significant sleep  disordered breathing.  These studies are not available to me currently, and  they have been purged from sleep lab records.  Patient has continued to have  loud snoring but no one has ever mentioned pauses in her breathing during  sleep.  She describes classic snoring arousals but she has also had some  choking arousals.  Patient has also had breakthrough gastroesophageal reflux  disease symptoms and atypical chest pain that may be associated with them.  She is prescribed Prevacid but does not take it on a regular basis.  The  patient typically goes to bed between 8:30 and 9:00 and gets up at 4:50 to  start her day.  She is tired whenever she arouses.  Patient feels that she  is much more symptomatic now than she was at her prior sleep studies.  This  has especially become a problem since October of 2006.  The patient has  sleepiness with periods of inactivity and we usually get up and do something  active.  She tries to avoid inactive things such as reading because she will  get sleepy.  She has no difficulties driving short distances and really does  not have the opportunity to drive long distances.  Patient also has a lot of  other medical issues ongoing including hypothyroidism which is being treated  by Dr. Lucianne Muss and her medications were just recently  adjusted.  She also has  a history of elevated CPKs which is followed by Dr. Kellie Simmering.  Patient states  they were as high as 2400 at one time.   PAST MEDICAL HISTORY:  1.  Hypertension.  2.  Status post cholecystectomy.  3.  Status post hysterectomy.  4.  History of hypothyroidism.  5.  History of elevated CPKs.   CURRENT MEDICATIONS:  1.  Norvasc 5 mg daily.  2.  Hydrochlorothiazide 25 daily.  3.  K-Dur 10 mEq daily.  4.  Vivelle patch 0.0375 daily.  5.  Armour Thyroid 90 mcg daily.  6.  Prevacid 30 mg p.r.n.  7.  Tylenol p.r.n.   ALLERGIES:  Patient has intolerance to AZITHROMYCIN.   SOCIAL HISTORY:  The patient is currently single.  She has a history of  smoking one half pack per day for 30 years.  She quit smoking in 2005.   FAMILY HISTORY:  Remarkable for her father having had heart disease,  otherwise is noncontributory in first  degree relatives.   REVIEW OF SYSTEMS:  As per history of present illness.  Also see patient  intake form documented on the chart.   PHYSICAL EXAMINATION:  GENERAL APPEARANCE:  She is an overweight black  female in no acute distress.  VITAL SIGNS:  Blood pressure 134/82, pulse 88, temperature 98.5, weight 195  pounds, O2 saturation on room air is 97%.  HEENT:  Pupils are equal, round, reactive to light and accommodation.  Extraocular muscles are intact.  Nares are patent without discharge.  Oropharynx shows small oropharynx with elongation of soft palate and uvula.  NECK:  Supple without JVD or lymphadenopathy.  There does appear to be a  somewhat prominent thyroid versus hypertrophy of her strap muscles.  CHEST:  Totally clear.  CARDIOVASCULAR:  Regular rate and rhythm with no murmurs, rubs, or gallops.  ABDOMEN:  Soft, nontender with good bowel sounds.  GENITAL/RECTAL/BREASTS:  Exams were done and not indicated.  EXTREMITIES:  Lower extremities are without edema, good pulses distally.  There is no calf tenderness.  NEUROLOGIC:  Alert and  oriented with no obvious observable motor defects.   IMPRESSION:  Questionable obstructive sleep apnea.  Certainly the patient  does have some of the symptoms that may be consistent with this diagnosis,  however, she also has a lot of things going on medically.  I wonder how much  reflux may be contributing to both her snoring and choking arousals that she  has during the night.  The patient has also been hypothyroid and is having  her medications adjusted and feels that over the last three weeks she is  much improved.  She also has a history of elevated CPKs and I would wonder  whether or not this could be consistent with dermatomyositis or  polymyositis.  She is currently seeing a rheumatologist.  At this point in  time, the patient feels that she has improved so much over the last three  weeks that she would like to wait and see how much of an effect the  medications will make over the short run.  I told her that if she does not  return back to a reasonable baseline that she needs to contact me  immediately so we can set up the sleep study.   PLAN:  1.  The patient wishes to hold off on NPSG until she has had more time to      see how much the medication adjustments will allow her to improve.  2.  I have asked her to work aggressively on weight loss.  3.  I have asked the patient to restart her proton pump inhibitor nightly in      light of her symptomatology.  4.  The patient will call me if she is not improving to an acceptable      quality of life  so that we can set up a nocturnal polysomnography.                                   Barbaraann Share, MD, FCCP   [facility]/MedQ  DD:  11/27/2005  DT:  11/27/2005  Job #:  [id number]   cc:   Vesta Mixer, MD  Marcene Duos, MD  Reather Littler, MD

## 2010-09-09 NOTE — H&P (Signed)
[facility]  Patient:    [name], [name]                       MRN: [id number] Adm. Date:  11/15/99 Attending:  Duke Salvia. Marcelle Overlie, M.D.                         History and Physical  CHIEF COMPLAINT:              Menorrhagia, chronic pelvic pain.  HISTORY OF PRESENT ILLNESS:   A 50 year old, G0, P0, using condoms for contraception.  This patient has a several year history of menorrhagia, dysmenorrhea, and chronic pelvic pain.  I saw her originally in 24.  At that time she was complaining of continued problems with dyspareunia and pelvic pain.  Dr. Chevis Pretty had performed a laparoscopy in 1996 at which time she had a 3 cm fibroid and some filmy adnexal adhesions.  She underwent laparoscopy by me in 1998.  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  Pelvic findings were otherwise unremarkable.  She has had a trial of different OCPs over the years without significant improvement.  She underwent GI evaluation by Dr. Matthias Hughs with colonoscopy September 1999 that was normal.  She has also not responded well to nonsteroidal pain medications and presents now for definitive hysterectomy.  ALLERGIES:                    E-MYCIN and AMOXICILLIN.  PAST MEDICAL HISTORY:         Operations:  Laparoscopy x 3.  Benign breast biopsy.  D&C x 2 in the past.  Cholecystectomy.  Colonoscopy.  Endoscopy. Excision of a lipoma in 1992.  REVIEW OF SYSTEMS:            Smokes one-half PPD.  Review of systems otherwise unremarkable.  PHYSICAL EXAMINATION:  VITAL SIGNS:                  Temperature 98.2, blood pressure 120/72.  HEENT:                        Unremarkable.  NECK:                         Supple without masses.  LUNGS:                        Clear.  CARDIOVASCULAR:               Regular rate and rhythm without murmurs, rubs or gallops.  BREASTS:                      Without masses.  ABDOMEN:                      Soft, flat,  nontender.  PELVIC:                       Normal external genitalia ______ vagina ______ clear.  Uterus 8-10 weeks, irregular especially to the left.  Adnexa negative.   EXTREMITIES:                  Unremarkable.  NEUROLOGICAL:                 Unremarkable.  IMPRESSION:  Chronic pelvic pain, dyspareunia, dysmenorrhagia, menorrhagia unresponsive to conservative measures.  PLAN:                         TAH.  This procedure including the fact that she will not be able to get pregnant, other risks regarding bleeding, transfusions, adjacent organ injury, infection, all reviewed with her.  The expected recovery time discussed also.  She understands that both ovaries will be conserved if normal, but if pathology is encountered she may require a BSO depending on the findings. DD:  11/11/99 TD:  11/11/99 Job: [id number]

## 2010-10-17 ENCOUNTER — Other Ambulatory Visit (HOSPITAL_COMMUNITY): Payer: Self-pay | Admitting: Neurology

## 2010-10-17 DIAGNOSIS — R209 Unspecified disturbances of skin sensation: Secondary | ICD-10-CM

## 2010-10-21 ENCOUNTER — Ambulatory Visit (HOSPITAL_COMMUNITY)
Admission: RE | Admit: 2010-10-21 | Discharge: 2010-10-21 | Disposition: A | Discharge status: Home / Self Care | Payer: Commercial Managed Care - PPO | Source: Ambulatory Visit | Attending: Neurology | Admitting: Neurology

## 2010-10-21 DIAGNOSIS — R209 Unspecified disturbances of skin sensation: Secondary | ICD-10-CM | POA: Insufficient documentation

## 2010-12-08 ENCOUNTER — Other Ambulatory Visit: Payer: Self-pay | Admitting: Internal Medicine

## 2010-12-08 NOTE — Telephone Encounter (Signed)
Rx refill sent to pharmacy. 

## 2010-12-09 ENCOUNTER — Telehealth: Payer: Self-pay | Admitting: *Deleted

## 2010-12-09 DIAGNOSIS — E039 Hypothyroidism, unspecified: Secondary | ICD-10-CM

## 2010-12-09 NOTE — Telephone Encounter (Signed)
Labs transferred from lab schedule for order in chart 

## 2010-12-12 ENCOUNTER — Other Ambulatory Visit: Payer: Commercial Managed Care - PPO

## 2011-01-30 ENCOUNTER — Telehealth: Payer: Self-pay | Admitting: *Deleted

## 2011-01-30 DIAGNOSIS — E039 Hypothyroidism, unspecified: Secondary | ICD-10-CM

## 2011-01-30 NOTE — Telephone Encounter (Signed)
Tsh/ft4 -hypothyroidism, chem7, a1c 250.0, vit d- vit d deficiency

## 2011-01-30 NOTE — Telephone Encounter (Signed)
Patient called and left voice message stating she is scheduled for follow up with Dr Rodena Medin and was suppose to have her TSH checked back in August. Her message stated that she did not have that done and would like to know if it could be done prior to her office visit on 10/15 with Dr Rodena Medin. (are there other labs needed?)

## 2011-01-30 NOTE — Telephone Encounter (Signed)
Call placed to patient at [phone], no answer. A detailed voice message was left informing patient of fasting blood work.

## 2011-02-06 ENCOUNTER — Ambulatory Visit: Payer: Commercial Managed Care - PPO

## 2011-02-06 DIAGNOSIS — E039 Hypothyroidism, unspecified: Secondary | ICD-10-CM

## 2011-02-06 LAB — BASIC METABOLIC PANEL
BUN: 9 mg/dL (ref 6–23)
CO2: 25 mEq/L (ref 19–32)
Calcium: 9.1 mg/dL (ref 8.4–10.5)
Chloride: 106 mEq/L (ref 96–112)
Creatinine, Ser: 0.8 mg/dL (ref 0.4–1.2)
GFR: 103.52 mL/min (ref >60.00)
Glucose, Bld: 86 mg/dL (ref 70–99)
Potassium: 3.8 mEq/L (ref 3.5–5.1)
Sodium: 139 mEq/L (ref 135–145)

## 2011-02-06 LAB — TSH: TSH: 0.71 u[IU]/mL (ref 0.35–5.50)

## 2011-02-06 LAB — T4, FREE: Free T4: 0.59 ng/dL — ABNORMAL LOW (ref 0.60–1.60)

## 2011-02-06 LAB — VITAMIN D 25 HYDROXY (VIT D DEFICIENCY, FRACTURES): Vit D, 25-Hydroxy: 23 ng/mL — ABNORMAL LOW (ref 30–89)

## 2011-02-06 LAB — HEMOGLOBIN A1C: Hgb A1c MFr Bld: 5.8 % (ref 4.6–6.5)

## 2011-02-13 ENCOUNTER — Telehealth: Payer: Self-pay | Admitting: Internal Medicine

## 2011-02-13 ENCOUNTER — Encounter: Payer: Self-pay | Admitting: Internal Medicine

## 2011-02-13 ENCOUNTER — Ambulatory Visit (INDEPENDENT_AMBULATORY_CARE_PROVIDER_SITE_OTHER): Payer: 59 | Admitting: Internal Medicine

## 2011-02-13 ENCOUNTER — Ambulatory Visit: Payer: Commercial Managed Care - PPO | Admitting: Internal Medicine

## 2011-02-13 VITALS — BP 126/90 | HR 80 | Temp 98.1°F | Resp 18 | Wt 223.0 lb

## 2011-02-13 DIAGNOSIS — K589 Irritable bowel syndrome without diarrhea: Secondary | ICD-10-CM | POA: Insufficient documentation

## 2011-02-13 DIAGNOSIS — R7309 Other abnormal glucose: Secondary | ICD-10-CM

## 2011-02-13 DIAGNOSIS — R109 Unspecified abdominal pain: Secondary | ICD-10-CM

## 2011-02-13 DIAGNOSIS — M79676 Pain in unspecified toe(s): Secondary | ICD-10-CM | POA: Insufficient documentation

## 2011-02-13 DIAGNOSIS — K5900 Constipation, unspecified: Secondary | ICD-10-CM | POA: Insufficient documentation

## 2011-02-13 DIAGNOSIS — E039 Hypothyroidism, unspecified: Secondary | ICD-10-CM

## 2011-02-13 DIAGNOSIS — M79609 Pain in unspecified limb: Secondary | ICD-10-CM

## 2011-02-13 DIAGNOSIS — E559 Vitamin D deficiency, unspecified: Secondary | ICD-10-CM

## 2011-02-13 DIAGNOSIS — K5901 Slow transit constipation: Secondary | ICD-10-CM | POA: Insufficient documentation

## 2011-02-13 HISTORY — DX: Irritable bowel syndrome, unspecified: K58.9

## 2011-02-13 LAB — URINALYSIS
Bilirubin Urine: NEGATIVE
Glucose, UA: NEGATIVE mg/dL
Hgb urine dipstick: NEGATIVE
Ketones, ur: NEGATIVE mg/dL
Leukocytes, UA: NEGATIVE
Nitrite: NEGATIVE
Protein, ur: NEGATIVE mg/dL
Specific Gravity, Urine: 1.023 (ref 1.005–1.030)
Urobilinogen, UA: 0.2 mg/dL (ref 0.0–1.0)
pH: 5.5 (ref 5.0–8.0)

## 2011-02-13 MED ORDER — AZELASTINE HCL 0.1 % NA SOLN: 2.0000 | Freq: Two times a day (BID) | NASAL | Status: DC

## 2011-02-13 MED ORDER — METFORMIN HCL ER 500 MG PO TB24: 1000.0000 mg | ORAL_TABLET | Freq: Every day | ORAL | Status: DC

## 2011-02-13 MED ORDER — LEVOFLOXACIN 500 MG PO TABS: 500.0000 mg | ORAL_TABLET | Freq: Every day | ORAL | Status: AC

## 2011-02-13 NOTE — Progress Notes (Signed)
  Subjective:    Patient ID: [name], female    DOB: March 08, 1961, 50 y.o.   MRN: [id number]  HPI Pt presents to clinic for followup of multiple medical problems. Notes 3wk h/o left lower quandrant abdominal pain. +associated nausea without radiation or emesis. No blood in stool. Pt wonders about possible diverticulitis and last colonoscopy report not found. Colonoscopy was performed 2008 with fam hx of colon cancer in first degree relative. H/o hypothyroidism and reviewed slightly low free t4. Vitamin d level reviewed as low without known fx hx. C/o left 2nd toe base pain without injury/trauma and no inflammatory changes. Wishes to avoid nsaids due to stomach upset. a1c reviewed excellent 5.8 taking metformin 1000mg  daily instead of 1500mg . No other complaints.  Past Medical History  Diagnosis Date  . GERD (gastroesophageal reflux disease)   . Hypertension   . Thyroid disease     hypothyroidism  . Anxiety   . Depression   . Hyperlipidemia   . Chronic rhinosinusitis   . OSA (obstructive sleep apnea)   . Obesity   . Colon polyps   . Fibroid, uterine   . Hoarseness    Past Surgical History  Procedure Date  . Cholecystectomy   . Abdominal hysterectomy     reports that she quit smoking about 7 years ago. She has never used smokeless tobacco. She reports that she does not drink alcohol. Her drug history not on file. family history includes Alcohol abuse in her father; Cancer in her father; Diabetes in an unspecified family member; Hyperlipidemia in her father; and Hypertension in her father. Allergies  Allergen Reactions  . Aspirin     REACTION: Upset stomach     Review of Systems see hpi     Objective:   Physical Exam  Nursing note and vitals reviewed. Constitutional: She appears well-developed and well-nourished. No distress.  HENT:  Head: Normocephalic and atraumatic.  Eyes: Conjunctivae are normal. No scleral icterus.  Abdominal: Soft. Normal appearance and bowel  sounds are normal. She exhibits no distension, no ascites and no mass. There is no hepatosplenomegaly. There is tenderness in the left lower quadrant. There is no rigidity, no rebound and no guarding.  Musculoskeletal:       Left foot FROM ankle/toes. Toe joints without erythema, warmth or effusion. NT. Gait nl. Able to wt bear and ambulate without difficulty  Skin: She is not diaphoretic.          Assessment & Plan:

## 2011-02-13 NOTE — Patient Instructions (Signed)
Please schedule tsh/free t4 (hypothyroidism), vitamin d (vit d deficiency), cbc, chem7, a1c, urine microalbumin (250.0) prior to next visit Please take over the counter vitamin D 2000 units once a day Increase your synthroid to one and a half tablets on Tuesday and Thursday and one tablet all over days.

## 2011-02-13 NOTE — Assessment & Plan Note (Signed)
Increase synthroid 1.5 on tues/thurs and remaining days 1 qd. Repeat tsh/free t4 with next visit

## 2011-02-13 NOTE — Assessment & Plan Note (Signed)
Atraumatic. Declines nsaids. Followup if no improvement or worsening.

## 2011-02-13 NOTE — Assessment & Plan Note (Signed)
Obtain ua. Begin levaquin x 7days. Followup if no improvement or worsening.

## 2011-02-13 NOTE — Telephone Encounter (Signed)
Lab orders entered for [facility] for January 2013.

## 2011-02-13 NOTE — Assessment & Plan Note (Signed)
Begin otc vitamin d 2000 units qd.

## 2011-03-10 ENCOUNTER — Other Ambulatory Visit: Payer: Self-pay | Admitting: Internal Medicine

## 2011-03-10 NOTE — Telephone Encounter (Signed)
Rx refill sent to pharmacy. 

## 2011-05-01 ENCOUNTER — Other Ambulatory Visit (INDEPENDENT_AMBULATORY_CARE_PROVIDER_SITE_OTHER): Payer: 59

## 2011-05-01 DIAGNOSIS — E039 Hypothyroidism, unspecified: Secondary | ICD-10-CM

## 2011-05-01 DIAGNOSIS — E119 Type 2 diabetes mellitus without complications: Secondary | ICD-10-CM

## 2011-05-01 LAB — CBC WITH DIFFERENTIAL/PLATELET
Basophils Absolute: 0 10*3/uL (ref 0.0–0.1)
Basophils Relative: 1.1 % (ref 0.0–3.0)
Eosinophils Absolute: 0.1 10*3/uL (ref 0.0–0.7)
Eosinophils Relative: 1.9 % (ref 0.0–5.0)
HCT: 39.8 % (ref 36.0–46.0)
Hemoglobin: 13.6 g/dL (ref 12.0–15.0)
Lymphocytes Relative: 43.7 % (ref 12.0–46.0)
Lymphs Abs: 1.7 10*3/uL (ref 0.7–4.0)
MCHC: 34.2 g/dL (ref 30.0–36.0)
MCV: 85.7 fl (ref 78.0–100.0)
Monocytes Absolute: 0.3 10*3/uL (ref 0.1–1.0)
Monocytes Relative: 8.9 % (ref 3.0–12.0)
Neutro Abs: 1.7 10*3/uL (ref 1.4–7.7)
Neutrophils Relative %: 44.4 % (ref 43.0–77.0)
Platelets: 322 10*3/uL (ref 150.0–400.0)
RBC: 4.65 Mil/uL (ref 3.87–5.11)
RDW: 15.3 % — ABNORMAL HIGH (ref 11.5–14.6)
WBC: 3.9 10*3/uL — ABNORMAL LOW (ref 4.5–10.5)

## 2011-05-01 LAB — TSH: TSH: 0.58 u[IU]/mL (ref 0.35–5.50)

## 2011-05-01 LAB — BASIC METABOLIC PANEL
BUN: 9 mg/dL (ref 6–23)
CO2: 27 mEq/L (ref 19–32)
Calcium: 9.3 mg/dL (ref 8.4–10.5)
Chloride: 106 mEq/L (ref 96–112)
Creatinine, Ser: 0.9 mg/dL (ref 0.4–1.2)
GFR: 87.33 mL/min (ref >60.00)
Glucose, Bld: 88 mg/dL (ref 70–99)
Potassium: 3.5 mEq/L (ref 3.5–5.1)
Sodium: 140 mEq/L (ref 135–145)

## 2011-05-01 LAB — HEMOGLOBIN A1C: Hgb A1c MFr Bld: 5.9 % (ref 4.6–6.5)

## 2011-05-01 LAB — T4, FREE: Free T4: 0.63 ng/dL (ref 0.60–1.60)

## 2011-05-01 LAB — MICROALBUMIN / CREATININE URINE RATIO
Creatinine,U: 261.2 mg/dL
Microalb Creat Ratio: 0.5 mg/g (ref 0.0–30.0)
Microalb, Ur: 1.4 mg/dL (ref 0.0–1.9)

## 2011-05-02 LAB — VITAMIN D 25 HYDROXY (VIT D DEFICIENCY, FRACTURES): Vit D, 25-Hydroxy: 24 ng/mL — ABNORMAL LOW (ref 30–89)

## 2011-05-09 ENCOUNTER — Encounter: Payer: Self-pay | Admitting: Internal Medicine

## 2011-05-09 ENCOUNTER — Telehealth: Payer: Self-pay | Admitting: Internal Medicine

## 2011-05-09 ENCOUNTER — Ambulatory Visit (INDEPENDENT_AMBULATORY_CARE_PROVIDER_SITE_OTHER): Payer: 59 | Admitting: Internal Medicine

## 2011-05-09 DIAGNOSIS — R7309 Other abnormal glucose: Secondary | ICD-10-CM

## 2011-05-09 DIAGNOSIS — J310 Chronic rhinitis: Secondary | ICD-10-CM

## 2011-05-09 NOTE — Patient Instructions (Signed)
Please schedule chem7, a1c 250.0 and vitamin d (vit d def) prior to next visit

## 2011-05-14 NOTE — Progress Notes (Signed)
  Subjective:    Patient ID: [name], female    DOB: 12-27-1960, 51 y.o.   MRN: [id number]  HPI Pt presents to clinic for followup of multiple medical problems.  Notes 2wk h/o nasal drainage. +right paranasal sinus pain without f/c. a1c reviewed 5.9 tolerating metformin without gi adverse effect. No other complaints.  Past Medical History  Diagnosis Date  . GERD (gastroesophageal reflux disease)   . Hypertension   . Thyroid disease     hypothyroidism  . Anxiety   . Depression   . Hyperlipidemia   . Chronic rhinosinusitis   . OSA (obstructive sleep apnea)   . Obesity   . Colon polyps   . Fibroid, uterine   . Hoarseness    Past Surgical History  Procedure Date  . Cholecystectomy   . Abdominal hysterectomy     reports that she quit smoking about 8 years ago. She has never used smokeless tobacco. She reports that she does not drink alcohol. Her drug history not on file. family history includes Alcohol abuse in her father; Cancer in her father; Diabetes in an unspecified family member; Hyperlipidemia in her father; and Hypertension in her father. Allergies  Allergen Reactions  . Aspirin     REACTION: Upset stomach      Review of Systems see hpi     Objective:   Physical Exam  Nursing note and vitals reviewed. Constitutional: She appears well-developed and well-nourished. No distress.  HENT:  Head: Normocephalic and atraumatic.  Right Ear: External ear normal.  Left Ear: External ear normal.  Nose: Right sinus exhibits maxillary sinus tenderness. Right sinus exhibits no frontal sinus tenderness. Left sinus exhibits no maxillary sinus tenderness and no frontal sinus tenderness.  Mouth/Throat: Oropharynx is clear and moist. No oropharyngeal exudate.  Eyes: Conjunctivae are normal. No scleral icterus.  Cardiovascular: Normal rate, regular rhythm and normal heart sounds.  Exam reveals no gallop and no friction rub.   No murmur heard. Pulmonary/Chest: Effort normal and  breath sounds normal. No respiratory distress. She has no wheezes. She has no rales.  Neurological: She is alert.  Skin: Skin is warm and dry. She is not diaphoretic.  Psychiatric: She has a normal mood and affect.          Assessment & Plan:

## 2011-05-14 NOTE — Assessment & Plan Note (Signed)
Attempt veramyst and netti pot. Consider abx if no improvement.

## 2011-05-14 NOTE — Assessment & Plan Note (Signed)
Discussed potential dc of metformin with diet/exercise/wt loss.

## 2011-05-30 ENCOUNTER — Other Ambulatory Visit: Payer: Self-pay | Admitting: Internal Medicine

## 2011-06-01 NOTE — Telephone Encounter (Signed)
Received refill from pharmacy for levothyroxine once daily dosing. Verified with pt that she takes 1 1/2 tablets on Tuesday and Thursday and 1 tablet all other days as stated in her record. Will send refill to pharmacy.

## 2011-08-29 ENCOUNTER — Other Ambulatory Visit: Payer: Self-pay | Admitting: *Deleted

## 2011-08-29 ENCOUNTER — Ambulatory Visit: Payer: 59

## 2011-08-29 DIAGNOSIS — E119 Type 2 diabetes mellitus without complications: Secondary | ICD-10-CM

## 2011-08-29 DIAGNOSIS — I10 Essential (primary) hypertension: Secondary | ICD-10-CM

## 2011-08-29 LAB — COMPREHENSIVE METABOLIC PANEL
ALT: 17 U/L (ref 0–35)
AST: 20 U/L (ref 0–37)
Albumin: 3.8 g/dL (ref 3.5–5.2)
Alkaline Phosphatase: 50 U/L (ref 39–117)
BUN: 10 mg/dL (ref 6–23)
CO2: 25 mEq/L (ref 19–32)
Calcium: 9.4 mg/dL (ref 8.4–10.5)
Chloride: 103 mEq/L (ref 96–112)
Creatinine, Ser: 1 mg/dL (ref 0.4–1.2)
GFR: 74.39 mL/min (ref >60.00)
Glucose, Bld: 86 mg/dL (ref 70–99)
Potassium: 4 mEq/L (ref 3.5–5.1)
Sodium: 139 mEq/L (ref 135–145)
Total Bilirubin: 0.3 mg/dL (ref 0.3–1.2)
Total Protein: 7.2 g/dL (ref 6.0–8.3)

## 2011-08-29 LAB — HEMOGLOBIN A1C: Hgb A1c MFr Bld: 5.7 % (ref 4.6–6.5)

## 2011-08-29 NOTE — Progress Notes (Signed)
Per 01.17.13 OV notes/SLS

## 2011-08-30 LAB — VITAMIN D 25 HYDROXY (VIT D DEFICIENCY, FRACTURES): Vit D, 25-Hydroxy: 42 ng/mL (ref 30–89)

## 2011-09-05 ENCOUNTER — Ambulatory Visit (INDEPENDENT_AMBULATORY_CARE_PROVIDER_SITE_OTHER): Payer: 59 | Admitting: Internal Medicine

## 2011-09-05 ENCOUNTER — Encounter: Payer: Self-pay | Admitting: Internal Medicine

## 2011-09-05 ENCOUNTER — Telehealth: Payer: Self-pay | Admitting: Internal Medicine

## 2011-09-05 VITALS — BP 132/80 | HR 83 | Temp 98.3°F | Resp 19 | Ht 64.0 in | Wt 217.0 lb

## 2011-09-05 DIAGNOSIS — G4733 Obstructive sleep apnea (adult) (pediatric): Secondary | ICD-10-CM

## 2011-09-05 DIAGNOSIS — E039 Hypothyroidism, unspecified: Secondary | ICD-10-CM

## 2011-09-05 DIAGNOSIS — J309 Allergic rhinitis, unspecified: Secondary | ICD-10-CM

## 2011-09-05 DIAGNOSIS — R0609 Other forms of dyspnea: Secondary | ICD-10-CM

## 2011-09-05 DIAGNOSIS — R7309 Other abnormal glucose: Secondary | ICD-10-CM

## 2011-09-05 DIAGNOSIS — R0600 Dyspnea, unspecified: Secondary | ICD-10-CM

## 2011-09-05 DIAGNOSIS — R0989 Other specified symptoms and signs involving the circulatory and respiratory systems: Secondary | ICD-10-CM

## 2011-09-05 DIAGNOSIS — E785 Hyperlipidemia, unspecified: Secondary | ICD-10-CM

## 2011-09-05 DIAGNOSIS — I10 Essential (primary) hypertension: Secondary | ICD-10-CM

## 2011-09-05 DIAGNOSIS — E559 Vitamin D deficiency, unspecified: Secondary | ICD-10-CM

## 2011-09-05 MED ORDER — ALBUTEROL SULFATE HFA 108 (90 BASE) MCG/ACT IN AERS: 2.0000 | INHALATION_SPRAY | Freq: Four times a day (QID) | RESPIRATORY_TRACT | Status: DC | PRN

## 2011-09-05 MED ORDER — CLOTRIMAZOLE 1 % EX CREA: TOPICAL_CREAM | Freq: Two times a day (BID) | CUTANEOUS | Status: DC

## 2011-09-05 NOTE — Progress Notes (Signed)
  Subjective:    Patient ID: [name], female    DOB: [birth year], 51 y.o.   MRN: [id number]  HPI Pt presents to clinic for followup of multiple medical problems. Notes DOE without wheezing. Has little chest tightness associated but without cp. H/o OSA and states needs pulmonary f/u. Has clear nasal discharge c/w rhinitis. Taking AH prn. a1c nl.  Past Medical History  Diagnosis Date  . GERD (gastroesophageal reflux disease)   . Hypertension   . Thyroid disease     hypothyroidism  . Anxiety   . Depression   . Hyperlipidemia   . Chronic rhinosinusitis   . OSA (obstructive sleep apnea)   . Obesity   . Colon polyps   . Fibroid, uterine   . Hoarseness    Past Surgical History  Procedure Date  . Cholecystectomy   . Abdominal hysterectomy     reports that she quit smoking about 8 years ago. She has never used smokeless tobacco. She reports that she does not drink alcohol. Her drug history not on file. family history includes Alcohol abuse in her father; Cancer in her father; Diabetes in an unspecified family member; Hyperlipidemia in her father; and Hypertension in her father. Allergies  Allergen Reactions  . Aspirin     REACTION: Upset stomach      Review of Systems see hpi     Objective:   Physical Exam  Physical Exam  Nursing note and vitals reviewed. Constitutional: Appears well-developed and well-nourished. No distress.  HENT:  Head: Normocephalic and atraumatic.  Right Ear: External ear normal.  Left Ear: External ear normal.  Eyes: Conjunctivae are normal. No scleral icterus.  Neck: Neck supple. Carotid bruit is not present.  Cardiovascular: Normal rate, regular rhythm and normal heart sounds.  Exam reveals no gallop and no friction rub.   No murmur heard. Pulmonary/Chest: Effort normal and breath sounds normal. No respiratory distress. He has no wheezes. no rales.  Lymphadenopathy:    He has no cervical adenopathy.  Neurological:Alert.  Skin: Skin is  warm and dry. Not diaphoretic.  Psychiatric: Has a normal mood and affect.        Assessment & Plan:

## 2011-09-05 NOTE — Telephone Encounter (Signed)
Lab order entered for [name] in September 2013.

## 2011-09-05 NOTE — Patient Instructions (Signed)
Please schedule fasting labs prior to next visit Chem7, a1c- glucose intolerance and lipid-401.9

## 2011-09-10 DIAGNOSIS — R0600 Dyspnea, unspecified: Secondary | ICD-10-CM | POA: Insufficient documentation

## 2011-09-10 DIAGNOSIS — R0602 Shortness of breath: Secondary | ICD-10-CM | POA: Insufficient documentation

## 2011-09-10 NOTE — Assessment & Plan Note (Signed)
Attempt albuterol mdi prn. Pulmonary consult if no improvement

## 2011-09-10 NOTE — Assessment & Plan Note (Signed)
Pulmonary referral 

## 2011-09-10 NOTE — Assessment & Plan Note (Signed)
Change AH.

## 2011-09-11 ENCOUNTER — Telehealth: Payer: Self-pay | Admitting: Pulmonary Disease

## 2011-09-11 ENCOUNTER — Other Ambulatory Visit: Payer: Self-pay | Admitting: Pulmonary Disease

## 2011-09-11 DIAGNOSIS — G4733 Obstructive sleep apnea (adult) (pediatric): Secondary | ICD-10-CM

## 2011-09-11 NOTE — Telephone Encounter (Signed)
Spoke with pt. She states that she spoke with [facility] recently and he agreed to order her a new CPAP machine prior to her pending consult to re establish here on 09-26-11. She uses [facility]. Please advise thanks!

## 2011-09-11 NOTE — Telephone Encounter (Signed)
Order sent to [facility].  

## 2011-09-12 NOTE — Telephone Encounter (Signed)
Pt aware order has been sent. Nothing further was needed 

## 2011-09-19 ENCOUNTER — Other Ambulatory Visit: Payer: Self-pay | Admitting: Internal Medicine

## 2011-09-26 ENCOUNTER — Institutional Professional Consult (permissible substitution): Payer: 59 | Admitting: Pulmonary Disease

## 2011-09-26 ENCOUNTER — Telehealth: Payer: Self-pay | Admitting: Pulmonary Disease

## 2011-09-26 NOTE — Telephone Encounter (Signed)
I spoke with pt and is aware of [facility] response. She voiced her understanding and was transferred up front to make apt to see [facility]. Nothing further was needed

## 2011-09-26 NOTE — Telephone Encounter (Signed)
Pt stated that the order that was received was for a new CPAP that is programmed w/ old figures.  Pt stated that she does not need a new CPAP, but that she needs the order to be for variable until her appt w/ [facility].  Antionette Fairy

## 2011-09-26 NOTE — Telephone Encounter (Signed)
I spoke with pt and she cancelled her consult apt with [facility] today bc she states [facility] just got everything straightned out regarding her cpap machine and will got pick this up on Thursday. She was told it was set for her old pressure settings from her old cpap machine and stated that pressure was too high. She was told by [facility] they would need a new order to readjust her pressure. Pt has not r/s'd her apt to see [facility]. Please advise Dr. Shelle Iron thanks

## 2011-09-26 NOTE — Telephone Encounter (Signed)
I have not seen the pt in over 3 yrs, and cannot send any further orders to her DME without seeing her.  I ordered her a new machine in order to help her, with the understanding that she had an apptm to see me.  Now that she has cancelled, no further info or orders will be coming from this office until she is seen.

## 2011-10-04 ENCOUNTER — Other Ambulatory Visit: Payer: Self-pay | Admitting: Internal Medicine

## 2011-10-04 NOTE — Telephone Encounter (Signed)
Rx refill sent to pharmacy. 

## 2011-10-09 ENCOUNTER — Telehealth: Payer: Self-pay | Admitting: Pulmonary Disease

## 2011-10-09 ENCOUNTER — Other Ambulatory Visit: Payer: Self-pay | Admitting: Pulmonary Disease

## 2011-10-09 DIAGNOSIS — G4733 Obstructive sleep apnea (adult) (pediatric): Secondary | ICD-10-CM

## 2011-10-09 NOTE — Telephone Encounter (Signed)
Done

## 2011-10-09 NOTE — Telephone Encounter (Signed)
I spoke with pt and she states she spoke with [facility] over the weekend and he told her he would send an order to her home care company to put her cpap machine on auto mode. Per pt she is going to keep her sleep consult apt w/ KC on 10/20/11. Please advise KC thanks

## 2011-10-10 NOTE — Telephone Encounter (Signed)
Spoke with pt and notified that order was sent. She verbalized understanding and states nothing further needed.

## 2011-10-20 ENCOUNTER — Ambulatory Visit (INDEPENDENT_AMBULATORY_CARE_PROVIDER_SITE_OTHER): Payer: 59 | Admitting: Pulmonary Disease

## 2011-10-20 ENCOUNTER — Encounter: Payer: Self-pay | Admitting: Pulmonary Disease

## 2011-10-20 VITALS — BP 118/80 | HR 84 | Temp 98.4°F | Ht 64.5 in | Wt 222.6 lb

## 2011-10-20 DIAGNOSIS — G4733 Obstructive sleep apnea (adult) (pediatric): Secondary | ICD-10-CM

## 2011-10-20 NOTE — Assessment & Plan Note (Signed)
Patient is doing much better since restarting CPAP with a new machine and on the automatic setting.  She is using nasal pillows, and does not feel that oral venting is an issue.  She feels that her sleep is improved, as is her daytime alertness.  I have asked her to continue on CPAP with the automatic setting, and we will get a download in another few weeks.  I've also encouraged her work aggressively on weight loss.

## 2011-10-20 NOTE — Progress Notes (Signed)
  Subjective:    Patient ID: [name], female    DOB: 04-16-1961, 51 y.o.   MRN: [id number]  HPI Patient comes in today for followup of her obstructive sleep apnea.  She has not been seen since 2010, and quit using her CPAP approximately 1 year ago because of pressure issues.  She felt the pressure "smothered her".  She has since had increasing issues with nonrestorative sleep and daytime sleepiness, and tried to restart her CPAP therapy.  She noted that her machine was having difficulties and making noises, and her supplies were old.  She has since gotten a new CPAP machine and mask, and is currently set on the automatic setting.  She has only been on this for the last one week, and feels that she is sleeping better with improved daytime alertness.  Of note, her weight is stable since the last visit in 2010.   Review of Systems  Constitutional: Negative for fever and unexpected weight change.  HENT: Positive for congestion, sneezing and dental problem. Negative for ear pain, nosebleeds, sore throat, rhinorrhea, trouble swallowing, postnasal drip and sinus pressure.   Eyes: Negative for redness and itching.  Respiratory: Positive for cough and shortness of breath. Negative for chest tightness and wheezing.   Cardiovascular: Negative for palpitations and leg swelling.  Gastrointestinal: Positive for abdominal pain. Negative for nausea and vomiting.  Genitourinary: Negative for dysuria.  Musculoskeletal: Negative for joint swelling.  Skin: Negative for rash.  Neurological: Negative for headaches.  Hematological: Does not bruise/bleed easily.  Psychiatric/Behavioral: Negative for dysphoric mood. The patient is not nervous/anxious.   All other systems reviewed and are negative.       Objective:   Physical Exam Obese female in no acute distress No skin breakdown or pressure necrosis from the CPAP mask  nose without purulence or discharge noted Chest clear to auscultation, no  wheezing Cardiac exam regular rate and rhythm Lower extremities with mild edema, no cyanosis Alert and oriented, does not appear to be sleepy, moves all 4 extremities.       Assessment & Plan:

## 2011-10-20 NOTE — Patient Instructions (Addendum)
Stay on cpap with auto setting, and bring the machine with power cord to the next visit.  Will download at that time.  Work on [employer] loss Will see you back on July 18th to discuss your breathing issues, and will get cxr before the visit for review.

## 2011-11-01 ENCOUNTER — Encounter: Payer: Self-pay | Admitting: Pulmonary Disease

## 2011-11-09 ENCOUNTER — Other Ambulatory Visit: Payer: Self-pay | Admitting: Pulmonary Disease

## 2011-11-09 ENCOUNTER — Ambulatory Visit (INDEPENDENT_AMBULATORY_CARE_PROVIDER_SITE_OTHER): Payer: 59 | Admitting: Pulmonary Disease

## 2011-11-09 ENCOUNTER — Ambulatory Visit (INDEPENDENT_AMBULATORY_CARE_PROVIDER_SITE_OTHER)
Admission: RE | Admit: 2011-11-09 | Discharge: 2011-11-09 | Disposition: A | Discharge status: Home / Self Care | Payer: 59 | Source: Ambulatory Visit | Attending: Pulmonary Disease | Admitting: Pulmonary Disease

## 2011-11-09 ENCOUNTER — Encounter: Payer: Self-pay | Admitting: Pulmonary Disease

## 2011-11-09 VITALS — BP 118/84 | HR 44 | Temp 98.4°F | Ht 64.0 in | Wt 223.0 lb

## 2011-11-09 DIAGNOSIS — R0989 Other specified symptoms and signs involving the circulatory and respiratory systems: Secondary | ICD-10-CM

## 2011-11-09 DIAGNOSIS — G4733 Obstructive sleep apnea (adult) (pediatric): Secondary | ICD-10-CM

## 2011-11-09 DIAGNOSIS — R0609 Other forms of dyspnea: Secondary | ICD-10-CM

## 2011-11-09 DIAGNOSIS — R0600 Dyspnea, unspecified: Secondary | ICD-10-CM

## 2011-11-09 NOTE — Patient Instructions (Addendum)
Will get records from Drs. Willis and Truslow regarding your muscle issues. Will set up for breathing studies in the next one week or so, and call you when the results are available.  Work on weight loss and conditioning.

## 2011-11-09 NOTE — Assessment & Plan Note (Signed)
The patient is doing very well on her current CPAP set up on the automatic setting.  She has excellent compliance, good control of her obstructive events, and no significant leaks.  The patient feels more comfortable if she stays on the automatic setting, and we'll therefore keep the current setup.  I've encouraged her to work aggressively on weight loss.

## 2011-11-09 NOTE — Progress Notes (Signed)
  Subjective:    Patient ID: [name], female    DOB: Jun 20, 1960, 51 y.o.   MRN: [id number]  HPI Patient comes in today for followup of her obstructive sleep apnea.  However, she is also having worsening breathing issues over the last 6 months and would like this to be evaluated as well.  She is wearing CPAP compliantly according to her download, and wishes to stay on the automatic setting for her daily treatment.  In terms of her breathing, she has a history of smoking in the past, but has never been told that she has COPD or asthma.  She states that she will get winded bringing groceries in from the car, or doing light housework.  She has a rare cough that is dry, and denies any issues with lower extremity edema.  She does have a history of thyroid disease, but believes this is under adequate control.  She's also had a cardiac workup in 2008 which she tells me was unrevealing.  The patient states that her weight is up 5 pounds over the last one year.  Finally, the patient states that she has a history of an elevated CPK.  She has seen both neurology and rheumatology, and it is unclear if there is a specific etiology.  She states that her numbers have been going up most recently.   Review of Systems  Constitutional: Negative for fever and unexpected weight change.  HENT: Positive for congestion, sneezing and postnasal drip. Negative for ear pain, nosebleeds, sore throat, rhinorrhea, trouble swallowing, dental problem and sinus pressure.   Eyes: Negative for redness and itching.  Respiratory: Positive for chest tightness and shortness of breath. Negative for cough and wheezing.   Cardiovascular: Negative for palpitations and leg swelling.  Gastrointestinal: Negative for nausea and vomiting.  Genitourinary: Negative for dysuria.  Musculoskeletal: Negative for joint swelling.  Skin: Negative for rash.  Neurological: Positive for headaches.  Hematological: Does not bruise/bleed easily.    Psychiatric/Behavioral: Negative for dysphoric mood. The patient is not nervous/anxious.   All other systems reviewed and are negative.       Objective:   Physical Exam Obese female in no acute distress Nose without purulence or discharge noted No skin breakdown or pressure necrosis from the CPAP mask as Oropharynx clear Chest with clear breath sounds bilaterally, no wheezing Cardiac exam with regular rate and rhythm Lower extremities with no significant edema, no cyanosis Alert and oriented, moves all 4 extremities.       Assessment & Plan:

## 2011-11-09 NOTE — Assessment & Plan Note (Signed)
The patient has had worsening dyspnea on exertion over the last 6 months of unknown origin.  She is obese and deconditioned, but also has a history of smoking and therefore may have obstructive lung disease.  Finally, she has also had this history of elevated CPK, and it is unclear whether she may have a muscle disease which contributes to her symptoms.  We'll try and get records from her neurologist and rheumatologist.  For now, will schedule her for full pulmonary function studies, and proceed from there.  She may need a followup cardiac workup, and has seen Dr. Elease Hashimoto in the past (2008).

## 2011-11-10 NOTE — Progress Notes (Signed)
Quick Note:  Called and spoke with patient and informed her of results as listed below per Dr. Shelle Iron. Patient verbalized understanding and had no further questions or concerns at this time. ______

## 2011-11-20 ENCOUNTER — Ambulatory Visit (INDEPENDENT_AMBULATORY_CARE_PROVIDER_SITE_OTHER): Payer: 59 | Admitting: Pulmonary Disease

## 2011-11-20 DIAGNOSIS — R0989 Other specified symptoms and signs involving the circulatory and respiratory systems: Secondary | ICD-10-CM

## 2011-11-20 DIAGNOSIS — R0600 Dyspnea, unspecified: Secondary | ICD-10-CM

## 2011-11-20 DIAGNOSIS — R0609 Other forms of dyspnea: Secondary | ICD-10-CM

## 2011-11-20 LAB — PULMONARY FUNCTION TEST

## 2011-11-20 NOTE — Progress Notes (Signed)
PFT done today. 

## 2011-11-24 ENCOUNTER — Telehealth: Payer: Self-pay | Admitting: Pulmonary Disease

## 2011-11-24 NOTE — Telephone Encounter (Signed)
Please let pt know that her pfts show no copd or asthma, her lung capacity is fairly normal, and that her oxygen transfer is normal when adjusted for her weight.  I see nothing from a lung standpoint to explain her sob.  More than likely related to her weight and conditioning as we discussed, but if continues, would consider seeing Dr. Elease Hashimoto again from a heart standpoint.

## 2011-11-24 NOTE — Telephone Encounter (Signed)
Called, spoke with pt.   I informed her of below per Dr. Shelle Iron.  She verbalized understanding of this and voiced no further questions/concerns at this time.

## 2011-12-05 ENCOUNTER — Encounter: Payer: Self-pay | Admitting: Pulmonary Disease

## 2011-12-18 ENCOUNTER — Encounter: Payer: Self-pay | Admitting: Internal Medicine

## 2011-12-20 NOTE — Addendum Note (Signed)
Addended by: Regis Bill on: 12/20/2011 10:30 AM   Modules accepted: Orders

## 2011-12-20 NOTE — Telephone Encounter (Signed)
Has labs scheduled. Any way to add tsh/free t4/reverse 3t-hypothyroidism and vit d-vit d deficiency? ----- [message from Dr. Rodena Medin  New lab orders added per Dr. Rodena Medin for [id number] [facility] lab visit; replied to patient/SLS

## 2011-12-20 NOTE — Telephone Encounter (Signed)
Has labs scheduled. Any way to add tsh/free t4/reverse 3t-hypothyroidism and vit d-vit d deficiency? -----   Message ----- From: [name] Sent: 12/18/2011 4:20 PM To: Edwyna Perfect, MD Subject: Non-Urgent Medical Question ----- Message from [name] to Edwyna Perfect, MD sent at 12/18/2011 11:19 AM ----- I HAVE APPT ON SEPT 10 AND I ALSO HAVE SOME LABS TOO,CAN YOU PLEASE CHECK MY Reverse T3,BECAUSE I JUST HAVE NOT BEEN FEELING RIGHT,FATIGUE AND BRAIN FOG, MUSCEL ACHES .FOR ABOUT 2 MONTH   New lab orders placed in EMR for September 2013 visit at [facility] lab; patient responded to/SLS

## 2011-12-26 ENCOUNTER — Other Ambulatory Visit (INDEPENDENT_AMBULATORY_CARE_PROVIDER_SITE_OTHER): Payer: 59

## 2011-12-26 DIAGNOSIS — R7309 Other abnormal glucose: Secondary | ICD-10-CM

## 2011-12-26 DIAGNOSIS — E039 Hypothyroidism, unspecified: Secondary | ICD-10-CM

## 2011-12-26 DIAGNOSIS — I10 Essential (primary) hypertension: Secondary | ICD-10-CM

## 2011-12-26 DIAGNOSIS — E785 Hyperlipidemia, unspecified: Secondary | ICD-10-CM

## 2011-12-26 DIAGNOSIS — E559 Vitamin D deficiency, unspecified: Secondary | ICD-10-CM

## 2011-12-26 LAB — BASIC METABOLIC PANEL WITH GFR
BUN: 11 mg/dL (ref 6–23)
CO2: 26 meq/L (ref 19–32)
Calcium: 9.4 mg/dL (ref 8.4–10.5)
Chloride: 105 meq/L (ref 96–112)
Creatinine, Ser: 1 mg/dL (ref 0.4–1.2)
GFR: 77.84 mL/min
Glucose, Bld: 85 mg/dL (ref 70–99)
Potassium: 4 meq/L (ref 3.5–5.1)
Sodium: 139 meq/L (ref 135–145)

## 2011-12-26 LAB — TSH: TSH: 1.23 u[IU]/mL (ref 0.35–5.50)

## 2011-12-26 LAB — LIPID PANEL
Cholesterol: 188 mg/dL (ref 0–200)
HDL: 40.4 mg/dL (ref >39.00)
Total CHOL/HDL Ratio: 5
Triglycerides: 251 mg/dL — ABNORMAL HIGH (ref 0.0–149.0)
VLDL: 50.2 mg/dL — ABNORMAL HIGH (ref 0.0–40.0)

## 2011-12-26 LAB — HEMOGLOBIN A1C: Hgb A1c MFr Bld: 5.8 % (ref 4.6–6.5)

## 2011-12-26 LAB — T4, FREE: Free T4: 0.66 ng/dL (ref 0.60–1.60)

## 2011-12-26 LAB — LDL CHOLESTEROL, DIRECT: Direct LDL: 107.8 mg/dL

## 2011-12-27 LAB — VITAMIN D 25 HYDROXY (VIT D DEFICIENCY, FRACTURES): Vit D, 25-Hydroxy: 24 ng/mL — ABNORMAL LOW (ref 30–89)

## 2011-12-28 LAB — T3, REVERSE: T3, Reverse: 20 ng/dL (ref 8–25)

## 2012-01-02 ENCOUNTER — Telehealth: Payer: Self-pay | Admitting: Internal Medicine

## 2012-01-02 ENCOUNTER — Ambulatory Visit (INDEPENDENT_AMBULATORY_CARE_PROVIDER_SITE_OTHER): Payer: 59 | Admitting: Internal Medicine

## 2012-01-02 ENCOUNTER — Encounter: Payer: Self-pay | Admitting: Internal Medicine

## 2012-01-02 VITALS — BP 118/78 | HR 87 | Temp 98.5°F | Resp 16 | Wt 225.5 lb

## 2012-01-02 DIAGNOSIS — R5383 Other fatigue: Secondary | ICD-10-CM

## 2012-01-02 DIAGNOSIS — H539 Unspecified visual disturbance: Secondary | ICD-10-CM

## 2012-01-02 DIAGNOSIS — R5381 Other malaise: Secondary | ICD-10-CM

## 2012-01-02 DIAGNOSIS — I10 Essential (primary) hypertension: Secondary | ICD-10-CM

## 2012-01-02 DIAGNOSIS — E559 Vitamin D deficiency, unspecified: Secondary | ICD-10-CM

## 2012-01-02 NOTE — Patient Instructions (Signed)
Please take over the counter vitamin d 2000 units a day Please schedule fasting labs prior to next visit Cbc, lipid-401.9, b12-fatigue, vit d-vit d deficiency, lft-fatigue

## 2012-01-05 ENCOUNTER — Other Ambulatory Visit (HOSPITAL_COMMUNITY): Payer: Self-pay | Admitting: Gastroenterology

## 2012-01-05 DIAGNOSIS — R109 Unspecified abdominal pain: Secondary | ICD-10-CM

## 2012-01-06 DIAGNOSIS — H539 Unspecified visual disturbance: Secondary | ICD-10-CM | POA: Insufficient documentation

## 2012-01-06 NOTE — Progress Notes (Signed)
  Subjective:    Patient ID: [name], female    DOB: 1960/05/20, 51 y.o.   MRN: [id number]  HPI Pt presents to clinic for followup of multiple medical problems. Notes intermittent change in vision described as smoky. No injury or foreign body exposure. No alleviating or exacerbating factors. Review depressed vitamin D previously normalized. Blood pressure reviewed as normotensive.  Past Medical History  Diagnosis Date  . GERD (gastroesophageal reflux disease)   . Hypertension   . Thyroid disease     hypothyroidism  . Anxiety   . Depression   . Hyperlipidemia   . Chronic rhinosinusitis   . OSA (obstructive sleep apnea)   . Obesity   . Colon polyps   . Fibroid, uterine   . Hoarseness    Past Surgical History  Procedure Date  . Cholecystectomy   . Abdominal hysterectomy     reports that she quit smoking about 8 years ago. Her smoking use included Cigarettes. She has a 15 pack-year smoking history. She has never used smokeless tobacco. She reports that she does not drink alcohol. Her drug history not on file. family history includes Alcohol abuse in her father; Cancer in her father; Diabetes in an unspecified family member; Hyperlipidemia in her father; and Hypertension in her father. Allergies  Allergen Reactions  . Aspirin     REACTION: Upset stomach      Review of Systems see history of present illness     Objective:   Physical Exam  Physical Exam  Nursing note and vitals reviewed. Constitutional: Appears well-developed and well-nourished. No distress.  HENT:  Head: Normocephalic and atraumatic.  Right Ear: External ear normal.  Left Ear: External ear normal.  Eyes: Conjunctivae are normal. No scleral icterus.  Neck: Neck supple. Carotid bruit is not present.  Cardiovascular: Normal rate, regular rhythm and normal heart sounds.  Exam reveals no gallop and no friction rub.   No murmur heard. Pulmonary/Chest: Effort normal and breath sounds normal. No  respiratory distress. He has no wheezes. no rales.  Lymphadenopathy:    He has no cervical adenopathy.  Neurological:Alert.  Skin: Skin is warm and dry. Not diaphoretic.  Psychiatric: Has a normal mood and affect.        Assessment & Plan:

## 2012-01-06 NOTE — Assessment & Plan Note (Signed)
Normotensive and stable. Continue current regimen. Monitor bp as outpt and followup in clinic as scheduled.  

## 2012-01-06 NOTE — Assessment & Plan Note (Signed)
Ophthalmology consult

## 2012-01-06 NOTE — Assessment & Plan Note (Signed)
Begin vitamin D two thousand units daily. Obtain vitamin D level prior to next visit.

## 2012-01-09 ENCOUNTER — Ambulatory Visit (HOSPITAL_COMMUNITY)
Admission: RE | Admit: 2012-01-09 | Discharge: 2012-01-09 | Disposition: A | Discharge status: Home / Self Care | Payer: 59 | Source: Ambulatory Visit | Attending: Gastroenterology | Admitting: Gastroenterology

## 2012-01-09 DIAGNOSIS — K429 Umbilical hernia without obstruction or gangrene: Secondary | ICD-10-CM | POA: Insufficient documentation

## 2012-01-09 DIAGNOSIS — K219 Gastro-esophageal reflux disease without esophagitis: Secondary | ICD-10-CM | POA: Insufficient documentation

## 2012-01-09 DIAGNOSIS — I10 Essential (primary) hypertension: Secondary | ICD-10-CM | POA: Insufficient documentation

## 2012-01-09 DIAGNOSIS — R109 Unspecified abdominal pain: Secondary | ICD-10-CM

## 2012-01-09 MED ORDER — IOHEXOL 300 MG/ML  SOLN
100.0000 mL | Freq: Once | INTRAMUSCULAR | Status: AC | PRN
  Administered 2012-01-09: 100 mL via INTRAVENOUS

## 2012-01-30 ENCOUNTER — Other Ambulatory Visit: Payer: Self-pay | Admitting: Internal Medicine

## 2012-03-24 DIAGNOSIS — H409 Unspecified glaucoma: Secondary | ICD-10-CM

## 2012-03-24 HISTORY — DX: Unspecified glaucoma: H40.9

## 2012-03-25 ENCOUNTER — Other Ambulatory Visit (INDEPENDENT_AMBULATORY_CARE_PROVIDER_SITE_OTHER): Payer: 59

## 2012-03-25 DIAGNOSIS — R5381 Other malaise: Secondary | ICD-10-CM

## 2012-03-25 DIAGNOSIS — E559 Vitamin D deficiency, unspecified: Secondary | ICD-10-CM

## 2012-03-25 DIAGNOSIS — I10 Essential (primary) hypertension: Secondary | ICD-10-CM

## 2012-03-25 DIAGNOSIS — R5383 Other fatigue: Secondary | ICD-10-CM

## 2012-03-25 LAB — VITAMIN B12: Vitamin B-12: >1500 pg/mL — ABNORMAL HIGH (ref 211–911)

## 2012-03-25 LAB — CBC WITH DIFFERENTIAL/PLATELET
Basophils Absolute: 0.1 10*3/uL (ref 0.0–0.1)
Basophils Relative: 1.2 % (ref 0.0–3.0)
Eosinophils Absolute: 0.1 10*3/uL (ref 0.0–0.7)
Eosinophils Relative: 2.7 % (ref 0.0–5.0)
HCT: 39.5 % (ref 36.0–46.0)
Hemoglobin: 13.4 g/dL (ref 12.0–15.0)
Lymphocytes Relative: 41.9 % (ref 12.0–46.0)
Lymphs Abs: 2 10*3/uL (ref 0.7–4.0)
MCHC: 33.8 g/dL (ref 30.0–36.0)
MCV: 89.8 fl (ref 78.0–100.0)
Monocytes Absolute: 0.5 10*3/uL (ref 0.1–1.0)
Monocytes Relative: 10 % (ref 3.0–12.0)
Neutro Abs: 2.1 10*3/uL (ref 1.4–7.7)
Neutrophils Relative %: 44.2 % (ref 43.0–77.0)
Platelets: 309 10*3/uL (ref 150.0–400.0)
RBC: 4.4 Mil/uL (ref 3.87–5.11)
RDW: 13.7 % (ref 11.5–14.6)
WBC: 4.7 10*3/uL (ref 4.5–10.5)

## 2012-03-25 LAB — HEPATIC FUNCTION PANEL
ALT: 17 U/L (ref 0–35)
AST: 21 U/L (ref 0–37)
Albumin: 3.6 g/dL (ref 3.5–5.2)
Alkaline Phosphatase: 46 U/L (ref 39–117)
Bilirubin, Direct: 0.1 mg/dL (ref 0.0–0.3)
Total Bilirubin: 0.3 mg/dL (ref 0.3–1.2)
Total Protein: 7 g/dL (ref 6.0–8.3)

## 2012-03-25 LAB — LIPID PANEL
Cholesterol: 170 mg/dL (ref 0–200)
HDL: 46.6 mg/dL (ref >39.00)
LDL Cholesterol: 104 mg/dL — ABNORMAL HIGH (ref 0–99)
Total CHOL/HDL Ratio: 4
Triglycerides: 97 mg/dL (ref 0.0–149.0)
VLDL: 19.4 mg/dL (ref 0.0–40.0)

## 2012-03-25 NOTE — Addendum Note (Signed)
Addended by: Regis Bill on: 03/25/2012 08:56 AM   Modules accepted: Orders

## 2012-03-25 NOTE — Addendum Note (Signed)
Addended by: Regis Bill on: 03/25/2012 09:01 AM   Modules accepted: Orders

## 2012-03-26 ENCOUNTER — Encounter: Payer: Self-pay | Admitting: Internal Medicine

## 2012-03-26 LAB — VITAMIN D 25 HYDROXY (VIT D DEFICIENCY, FRACTURES): Vit D, 25-Hydroxy: 44 ng/mL (ref 30–89)

## 2012-03-29 MED ORDER — CLOTRIMAZOLE 1 % EX CREA: TOPICAL_CREAM | Freq: Two times a day (BID) | CUTANEOUS | Status: DC

## 2012-03-29 NOTE — Telephone Encounter (Signed)
Rx to pharmacy/SLS 

## 2012-04-02 ENCOUNTER — Ambulatory Visit (INDEPENDENT_AMBULATORY_CARE_PROVIDER_SITE_OTHER): Payer: 59 | Admitting: Internal Medicine

## 2012-04-02 VITALS — BP 140/80 | HR 93 | Wt 224.0 lb

## 2012-04-02 DIAGNOSIS — E559 Vitamin D deficiency, unspecified: Secondary | ICD-10-CM

## 2012-04-02 DIAGNOSIS — I10 Essential (primary) hypertension: Secondary | ICD-10-CM

## 2012-04-02 NOTE — Patient Instructions (Signed)
Please schedule fasting labs prior to next visit Chem7, a1c-hyperglycemia, tsh-hypothyroidism and vit d-vit d def

## 2012-04-05 ENCOUNTER — Other Ambulatory Visit: Payer: Self-pay | Admitting: Gastroenterology

## 2012-04-06 ENCOUNTER — Encounter: Payer: Self-pay | Admitting: Internal Medicine

## 2012-04-06 NOTE — Assessment & Plan Note (Signed)
Isolated elevation. Recommend outpt bp log

## 2012-04-06 NOTE — Assessment & Plan Note (Signed)
Normalized. Repeat vit d level next visit for stability

## 2012-04-06 NOTE — Progress Notes (Signed)
  Subjective:    Patient ID: [name], female    DOB: 05-15-1960, 51 y.o.   MRN: [id number]  HPI Pt presents to clinic for followup of multiple medical problems. Reviewed mild elevation of bp- isolated. Reviewed labs including normalized vit d.   Past Medical History  Diagnosis Date  . GERD (gastroesophageal reflux disease)   . Hypertension   . Thyroid disease     hypothyroidism  . Anxiety   . Depression   . Hyperlipidemia   . Chronic rhinosinusitis   . OSA (obstructive sleep apnea)   . Obesity   . Colon polyps   . Fibroid, uterine   . Hoarseness    Past Surgical History  Procedure Date  . Cholecystectomy   . Abdominal hysterectomy     reports that she quit smoking about 8 years ago. Her smoking use included Cigarettes. She has a 15 pack-year smoking history. She has never used smokeless tobacco. She reports that she does not drink alcohol. Her drug history not on file. family history includes Alcohol abuse in her father; Cancer in her father; Diabetes in an unspecified family member; Hyperlipidemia in her father; and Hypertension in her father. Allergies  Allergen Reactions  . Aspirin     REACTION: Upset stomach      Review of Systems see hpi     Objective:   Physical Exam  Physical Exam  Nursing note and vitals reviewed. Constitutional: Appears well-developed and well-nourished. No distress.  HENT:  Head: Normocephalic and atraumatic.  Right Ear: External ear normal.  Left Ear: External ear normal.  Eyes: Conjunctivae are normal. No scleral icterus.  Neck: Neck supple. Carotid bruit is not present.  Cardiovascular: Normal rate, regular rhythm and normal heart sounds.  Exam reveals no gallop and no friction rub.   No murmur heard. Pulmonary/Chest: Effort normal and breath sounds normal. No respiratory distress. He has no wheezes. no rales.  Lymphadenopathy:    He has no cervical adenopathy.  Neurological:Alert.  Skin: Skin is warm and dry. Not  diaphoretic.  Psychiatric: Has a normal mood and affect.        Assessment & Plan:

## 2012-04-18 ENCOUNTER — Encounter: Payer: Self-pay | Admitting: Internal Medicine

## 2012-04-18 MED ORDER — LEVOTHYROXINE SODIUM 75 MCG PO TABS: ORAL_TABLET | ORAL | Status: DC

## 2012-04-18 NOTE — Telephone Encounter (Signed)
Rx to pharmacy/SLS 

## 2012-04-23 ENCOUNTER — Telehealth: Payer: Self-pay | Admitting: Internal Medicine

## 2012-04-23 NOTE — Telephone Encounter (Signed)
Please schedule fasting labs prior to next visit  Chem7, a1c-hyperglycemia, tsh-hypothyroidism and vit d-vit d def   Per MyChart message, patient would like to go to [facility] lab. Patient has appointment scheduled in April/2014

## 2012-04-26 ENCOUNTER — Other Ambulatory Visit: Payer: Self-pay | Admitting: Internal Medicine

## 2012-04-26 NOTE — Telephone Encounter (Signed)
Rx to pharmacy/SLS 

## 2012-05-16 ENCOUNTER — Encounter: Payer: Self-pay | Admitting: Internal Medicine

## 2012-05-24 ENCOUNTER — Other Ambulatory Visit: Payer: Self-pay | Admitting: Occupational Medicine

## 2012-05-24 ENCOUNTER — Ambulatory Visit: Payer: Self-pay

## 2012-05-24 DIAGNOSIS — M25539 Pain in unspecified wrist: Secondary | ICD-10-CM

## 2012-06-08 ENCOUNTER — Other Ambulatory Visit: Payer: Self-pay

## 2012-07-22 ENCOUNTER — Telehealth: Payer: Self-pay

## 2012-07-22 DIAGNOSIS — R7309 Other abnormal glucose: Secondary | ICD-10-CM

## 2012-07-22 DIAGNOSIS — E559 Vitamin D deficiency, unspecified: Secondary | ICD-10-CM

## 2012-07-22 DIAGNOSIS — E039 Hypothyroidism, unspecified: Secondary | ICD-10-CM

## 2012-07-22 NOTE — Telephone Encounter (Signed)
Pt left a message that she is going to [employer] next Tuesday and needs Chem 7 TSH, A1C, and Vitamin D ordered.  Labs ordered

## 2012-07-25 ENCOUNTER — Other Ambulatory Visit: Payer: 59

## 2012-07-25 DIAGNOSIS — R7309 Other abnormal glucose: Secondary | ICD-10-CM

## 2012-07-25 DIAGNOSIS — E039 Hypothyroidism, unspecified: Secondary | ICD-10-CM

## 2012-07-25 DIAGNOSIS — E559 Vitamin D deficiency, unspecified: Secondary | ICD-10-CM

## 2012-07-25 LAB — BASIC METABOLIC PANEL
BUN: 11 mg/dL (ref 6–23)
CO2: 25 mEq/L (ref 19–32)
Calcium: 9.1 mg/dL (ref 8.4–10.5)
Chloride: 101 mEq/L (ref 96–112)
Creatinine, Ser: 1 mg/dL (ref 0.4–1.2)
GFR: 77.66 mL/min (ref >60.00)
Glucose, Bld: 87 mg/dL (ref 70–99)
Potassium: 3.5 mEq/L (ref 3.5–5.1)
Sodium: 134 mEq/L — ABNORMAL LOW (ref 135–145)

## 2012-07-25 LAB — HEMOGLOBIN A1C: Hgb A1c MFr Bld: 5.8 % (ref 4.6–6.5)

## 2012-07-25 LAB — TSH: TSH: 0.26 u[IU]/mL — ABNORMAL LOW (ref 0.35–5.50)

## 2012-07-26 LAB — VITAMIN D 25 HYDROXY (VIT D DEFICIENCY, FRACTURES): Vit D, 25-Hydroxy: 29 ng/mL — ABNORMAL LOW (ref 30–89)

## 2012-07-30 ENCOUNTER — Ambulatory Visit (INDEPENDENT_AMBULATORY_CARE_PROVIDER_SITE_OTHER): Payer: 59 | Admitting: Family Medicine

## 2012-07-30 ENCOUNTER — Encounter: Payer: Self-pay | Admitting: Family Medicine

## 2012-07-30 VITALS — BP 128/78 | HR 98 | Temp 98.8°F | Ht 64.0 in | Wt 224.1 lb

## 2012-07-30 DIAGNOSIS — H409 Unspecified glaucoma: Secondary | ICD-10-CM

## 2012-07-30 DIAGNOSIS — I10 Essential (primary) hypertension: Secondary | ICD-10-CM

## 2012-07-30 DIAGNOSIS — K589 Irritable bowel syndrome without diarrhea: Secondary | ICD-10-CM

## 2012-07-30 DIAGNOSIS — M545 Low back pain, unspecified: Secondary | ICD-10-CM

## 2012-07-30 DIAGNOSIS — IMO0001 Reserved for inherently not codable concepts without codable children: Secondary | ICD-10-CM

## 2012-07-30 DIAGNOSIS — F411 Generalized anxiety disorder: Secondary | ICD-10-CM

## 2012-07-30 DIAGNOSIS — M797 Fibromyalgia: Secondary | ICD-10-CM

## 2012-07-30 DIAGNOSIS — E785 Hyperlipidemia, unspecified: Secondary | ICD-10-CM

## 2012-07-30 DIAGNOSIS — R1013 Epigastric pain: Secondary | ICD-10-CM

## 2012-07-30 DIAGNOSIS — K219 Gastro-esophageal reflux disease without esophagitis: Secondary | ICD-10-CM

## 2012-07-30 DIAGNOSIS — E039 Hypothyroidism, unspecified: Secondary | ICD-10-CM

## 2012-07-30 LAB — T4, FREE: Free T4: 1.37 ng/dL (ref 0.80–1.80)

## 2012-07-30 MED ORDER — ALPRAZOLAM 0.25 MG PO TABS: 0.2500 mg | ORAL_TABLET | Freq: Two times a day (BID) | ORAL | Status: DC | PRN

## 2012-07-30 MED ORDER — VENLAFAXINE HCL ER 75 MG PO CP24: 75.0000 mg | ORAL_CAPSULE | Freq: Every day | ORAL | Status: DC

## 2012-07-30 MED ORDER — VENLAFAXINE HCL ER 37.5 MG PO CP24: 37.5000 mg | ORAL_CAPSULE | Freq: Every day | ORAL | Status: DC

## 2012-07-30 NOTE — Assessment & Plan Note (Signed)
Restart Zegerid, avoid offending foods, start Ranitidine and check an H pylori

## 2012-07-30 NOTE — Assessment & Plan Note (Signed)
Encouraged probiotics and started Effexor

## 2012-07-30 NOTE — Assessment & Plan Note (Deleted)
Avoid trans fats, consider krill oil 

## 2012-07-30 NOTE — Assessment & Plan Note (Signed)
Generalized myalgias and arthralgias, start Venlafaxine

## 2012-07-30 NOTE — Progress Notes (Signed)
Patient ID: [name], female   DOB: 02/12/1961, 52 y.o.   MRN: [id number] [name] [id number] [birth year] 07/30/2012      Progress Note-Follow Up  Subjective  Chief Complaint  Chief Complaint  Patient presents with  . Follow-up    HPI  Patient is a 52 year old female in today with multiple concerns. She's having worsening reflux. Dyspepsia but is not taking her Zegerid. No bloody or tarry stool in her bowels are moving forward. Does have cramping intermittently and some loose stool at times. Also struggles with chronic pain both myalgias and arthralgias. No recent injury or trauma no swelling or joint erythema. No recent illness. No chest pain or palpitations. No shortness of breath GI or GU complaints. She does note being under a great deal of stress and struggling with anxiety and some low mood. Denies any suicidal ideation. Past Medical History  Diagnosis Date  . GERD (gastroesophageal reflux disease)   . Hypertension   . Thyroid disease     hypothyroidism  . Anxiety   . Depression   . Hyperlipidemia   . Chronic rhinosinusitis   . OSA (obstructive sleep apnea)   . Obesity   . Colon polyps   . Fibroid, uterine   . Hoarseness   . Glaucoma 12-13  . IBS (irritable bowel syndrome) 02/13/2011    Past Surgical History  Procedure Laterality Date  . Cholecystectomy    . Abdominal hysterectomy      Family History  Problem Relation Age of Onset  . Alcohol abuse Father   . Cancer Father     renal and colon  . Hyperlipidemia Father   . Hypertension Father   . Diabetes      History   Social History  . Marital Status: Single    Spouse Name: N/A    Number of Children: N/A  . Years of Education: N/A   Occupational History  . Not on file.   Social History Main Topics  . Smoking status: Former Smoker -- 0.50 packs/day for 30 years    Types: Cigarettes    Quit date: 04/25/2003  . Smokeless tobacco: Never Used     Comment: smoked since age 40  . Alcohol  Use: No  . Drug Use: Not on file  . Sexually Active: Not on file   Other Topics Concern  . Not on file   Social History Narrative   Endo-- Dr Lucianne Muss   ENT--Dr shoemaker   GI--Dr Buccini   Pulm--Dr Clance   Rheum--Dr Kellie Simmering          Current Outpatient Prescriptions on File Prior to Visit  Medication Sig Dispense Refill  . b complex vitamins tablet Take 1 tablet by mouth daily.      . Beta Carotene (VITAMIN A) 25000 UNIT capsule Take 25,000 Units by mouth daily.      . cholecalciferol (VITAMIN D-400) 400 UNITS TABS Take 400 Units by mouth daily.      . clotrimazole (LOTRIMIN) 1 % cream Apply topically 2 (two) times daily.  60 g  1  . diphenhydrAMINE (BENADRYL) 25 MG tablet Take 25 mg by mouth at bedtime as needed.        Marland Kitchen estradiol (ESTRACE) 1 MG tablet Take 1 mg by mouth daily.        Marland Kitchen KLOR-CON M20 20 MEQ tablet TAKE 1 TABLET BY MOUTH DAILY  90 tablet  PRN  . levothyroxine (SYNTHROID, LEVOTHROID) 75 MCG tablet Take 1 1/2 tablets on Tuesday and  Thursday and 1 tablet all other days.  34 tablet  5  . loratadine-pseudoephedrine (ALLERGY/CONGESTION RELIEF) 10-240 MG per 24 hr tablet Take 1 tablet by mouth daily.        Marland Kitchen losartan-hydrochlorothiazide (HYZAAR) 50-12.5 MG per tablet TAKE 1 TABLET BY MOUTH DAILY  90 tablet  1  . metFORMIN (GLUCOPHAGE-XR) 500 MG 24 hr tablet Take 500 mg by mouth daily with breakfast.      . omeprazole-sodium bicarbonate (ZEGERID) 40-1100 MG per capsule Take 1 capsule by mouth daily.        . vitamin E 400 UNIT capsule Take 800 Units by mouth daily.       No current facility-administered medications on file prior to visit.    Allergies  Allergen Reactions  . Aspirin     REACTION: Upset stomach    Review of Systems  Review of Systems  Constitutional: Positive for malaise/fatigue. Negative for fever.  HENT: Negative for congestion.   Eyes: Negative for discharge.  Respiratory: Negative for shortness of breath.   Cardiovascular: Negative for chest  pain, palpitations and leg swelling.  Gastrointestinal: Positive for heartburn, abdominal pain, diarrhea and constipation. Negative for nausea and vomiting.  Genitourinary: Negative for dysuria.  Musculoskeletal: Positive for myalgias, back pain and joint pain. Negative for falls.  Skin: Negative for rash.  Neurological: Negative for loss of consciousness and headaches.  Endo/Heme/Allergies: Negative for polydipsia.  Psychiatric/Behavioral: Negative for depression and suicidal ideas. The patient is not nervous/anxious and does not have insomnia.     Objective  BP 128/78  Pulse 98  Temp(Src) 98.8 F (37.1 C) (Oral)  Ht 5\' 4"  (1.626 m)  Wt 224 lb 1.9 oz (101.66 kg)  BMI 38.45 kg/m2  SpO2 96%  Physical Exam  Physical Exam  Constitutional: She is oriented to person, place, and time and well-developed, well-nourished, and in no distress. No distress.  HENT:  Head: Normocephalic and atraumatic.  Eyes: Conjunctivae are normal.  Neck: Neck supple. No thyromegaly present.  Cardiovascular: Normal rate, regular rhythm and normal heart sounds.   No murmur heard. Pulmonary/Chest: Effort normal and breath sounds normal. She has no wheezes.  Abdominal: She exhibits no distension and no mass.  Musculoskeletal: She exhibits no edema.  Lymphadenopathy:    She has no cervical adenopathy.  Neurological: She is alert and oriented to person, place, and time.  Skin: Skin is warm and dry. No rash noted. She is not diaphoretic.  Psychiatric: Memory, affect and judgment normal.    Lab Results  Component Value Date   TSH 0.26* 07/25/2012   Lab Results  Component Value Date   WBC 4.7 03/25/2012   HGB 13.4 03/25/2012   HCT 39.5 03/25/2012   MCV 89.8 03/25/2012   PLT 309.0 03/25/2012   Lab Results  Component Value Date   CREATININE 1.0 07/25/2012   BUN 11 07/25/2012   NA 134* 07/25/2012   K 3.5 07/25/2012   CL 101 07/25/2012   CO2 25 07/25/2012   Lab Results  Component Value Date   ALT 17 03/25/2012    AST 21 03/25/2012   ALKPHOS 46 03/25/2012   BILITOT 0.3 03/25/2012   Lab Results  Component Value Date   CHOL 170 03/25/2012   Lab Results  Component Value Date   HDL 46.60 03/25/2012   Lab Results  Component Value Date   LDLCALC 104* 03/25/2012   Lab Results  Component Value Date   TRIG 97.0 03/25/2012   Lab Results  Component Value Date  CHOLHDL 4 03/25/2012     Assessment & Plan  IBS (irritable bowel syndrome) Encouraged probiotics and started Effexor   HYPERTENSION Well controlled, no changes  Esophageal reflux Restart Zegerid, avoid offending foods, start Ranitidine and check an H pylori  BACK PAIN, LUMBAR, CHRONIC Generalized myalgias and arthralgias, start Venlafaxine  HYPERLIPIDEMIA Avoid trans fats, consider krill oil

## 2012-07-30 NOTE — Assessment & Plan Note (Signed)
Well controlled, no changes 

## 2012-07-30 NOTE — Patient Instructions (Addendum)
Digestive Advantage or another probiotic daily Restart Zegerid  Gastroesophageal Reflux Disease, Adult Gastroesophageal reflux disease (GERD) happens when acid from your stomach flows up into the esophagus. When acid comes in contact with the esophagus, the acid causes soreness (inflammation) in the esophagus. Over time, GERD may create small holes (ulcers) in the lining of the esophagus. CAUSES   Increased body weight. This puts pressure on the stomach, making acid rise from the stomach into the esophagus.  Smoking. This increases acid production in the stomach.  Drinking alcohol. This causes decreased pressure in the lower esophageal sphincter (valve or ring of muscle between the esophagus and stomach), allowing acid from the stomach into the esophagus.  Late evening meals and a full stomach. This increases pressure and acid production in the stomach.  A malformed lower esophageal sphincter. Sometimes, no cause is found. SYMPTOMS   Burning pain in the lower part of the mid-chest behind the breastbone and in the mid-stomach area. This may occur twice a week or more often.  Trouble swallowing.  Sore throat.  Dry cough.  Asthma-like symptoms including chest tightness, shortness of breath, or wheezing. DIAGNOSIS  Your caregiver may be able to diagnose GERD based on your symptoms. In some cases, X-rays and other tests may be done to check for complications or to check the condition of your stomach and esophagus. TREATMENT  Your caregiver may recommend over-the-counter or prescription medicines to help decrease acid production. Ask your caregiver before starting or adding any new medicines.  HOME CARE INSTRUCTIONS   Change the factors that you can control. Ask your caregiver for guidance concerning weight loss, quitting smoking, and alcohol consumption.  Avoid foods and drinks that make your symptoms worse, such as:  Caffeine or alcoholic drinks.  Chocolate.  Peppermint or mint  flavorings.  Garlic and onions.  Spicy foods.  Citrus fruits, such as oranges, lemons, or limes.  Tomato-based foods such as sauce, chili, salsa, and pizza.  Fried and fatty foods.  Avoid lying down for the 3 hours prior to your bedtime or prior to taking a nap.  Eat small, frequent meals instead of large meals.  Wear loose-fitting clothing. Do not wear anything tight around your waist that causes pressure on your stomach.  Raise the head of your bed 6 to 8 inches with wood blocks to help you sleep. Extra pillows will not help.  Only take over-the-counter or prescription medicines for pain, discomfort, or fever as directed by your caregiver.  Do not take aspirin, ibuprofen, or other nonsteroidal anti-inflammatory drugs (NSAIDs). SEEK IMMEDIATE MEDICAL CARE IF:   You have pain in your arms, neck, jaw, teeth, or back.  Your pain increases or changes in intensity or duration.  You develop nausea, vomiting, or sweating (diaphoresis).  You develop shortness of breath, or you faint.  Your vomit is green, yellow, black, or looks like coffee grounds or blood.  Your stool is red, bloody, or black. These symptoms could be signs of other problems, such as heart disease, gastric bleeding, or esophageal bleeding. MAKE SURE YOU:   Understand these instructions.  Will watch your condition.  Will get help right away if you are not doing well or get worse. Document Released: 01/18/2005 Document Revised: 07/03/2011 Document Reviewed: 10/28/2010 [facility] Patient Information 2013 [place], [place].

## 2012-07-31 LAB — H. PYLORI ANTIBODY, IGG: H Pylori IgG: 1.59 {ISR} — ABNORMAL HIGH

## 2012-08-01 ENCOUNTER — Telehealth: Payer: Self-pay | Admitting: *Deleted

## 2012-08-01 MED ORDER — CLARITHROMYCIN 500 MG PO TABS: 500.0000 mg | ORAL_TABLET | Freq: Two times a day (BID) | ORAL | Status: DC

## 2012-08-01 MED ORDER — OMEPRAZOLE 20 MG PO CPDR: 20.0000 mg | DELAYED_RELEASE_CAPSULE | Freq: Every day | ORAL | Status: DC

## 2012-08-01 NOTE — Telephone Encounter (Signed)
Patient informed, understood & agreed to Biaxin & Omeprazole therapy[sent to pharmacy]; pt reports cannot take Amoxicillin d/t "having rash in past, not prior reported"[added to allergy list]/SLS Please advise.

## 2012-08-01 NOTE — Telephone Encounter (Signed)
unfortunately without the amox the other 2 don't work. We will need to send in a different set of agents. Needs Metronidazole 250 mg po qid, Tetracycline 500 mg po qid, Pepto Bismol chewable tabs 262.4 mg 2 tabs 4 x a day. All x 14 days

## 2012-08-01 NOTE — Telephone Encounter (Signed)
Message copied by Regis Bill on Thu Aug 01, 2012 12:01 PM ------      Message from: Danise Edge A      Created: Wed Jul 31, 2012  4:33 PM       Notify thyroid is normal but H pylori is positive, needs Amoxicillin 500 mg tabs 2 tabs po bid, Biaxin 500 mg tabs 1 tab po bid and Omeprazole 20 mg po bid all x 10 days. ------

## 2012-08-02 MED ORDER — TETRACYCLINE HCL 500 MG PO CAPS: 500.0000 mg | ORAL_CAPSULE | Freq: Four times a day (QID) | ORAL | Status: DC

## 2012-08-02 MED ORDER — METRONIDAZOLE 250 MG PO TABS: 250.0000 mg | ORAL_TABLET | Freq: Four times a day (QID) | ORAL | Status: DC

## 2012-08-02 MED ORDER — BISMUTH SUBSALICYLATE 262 MG PO CHEW: 524.0000 mg | CHEWABLE_TABLET | Freq: Four times a day (QID) | ORAL | Status: DC

## 2012-08-02 NOTE — Telephone Encounter (Signed)
New RX's sent to pharmacy. Left a detailed message on pts answering machine

## 2012-08-03 NOTE — Assessment & Plan Note (Signed)
Avoid trans fats, consider krill oil

## 2012-08-30 ENCOUNTER — Telehealth: Payer: Self-pay | Admitting: Family Medicine

## 2012-08-30 NOTE — Telephone Encounter (Signed)
Received medical records from [facility]

## 2012-09-11 ENCOUNTER — Encounter: Payer: Self-pay | Admitting: Family Medicine

## 2012-10-01 ENCOUNTER — Ambulatory Visit (INDEPENDENT_AMBULATORY_CARE_PROVIDER_SITE_OTHER): Payer: 59 | Admitting: Family Medicine

## 2012-10-01 ENCOUNTER — Encounter: Payer: Self-pay | Admitting: Family Medicine

## 2012-10-01 VITALS — BP 138/82 | HR 93 | Temp 98.1°F | Ht 64.0 in | Wt 232.0 lb

## 2012-10-01 DIAGNOSIS — F4323 Adjustment disorder with mixed anxiety and depressed mood: Secondary | ICD-10-CM

## 2012-10-01 DIAGNOSIS — K219 Gastro-esophageal reflux disease without esophagitis: Secondary | ICD-10-CM

## 2012-10-01 DIAGNOSIS — E785 Hyperlipidemia, unspecified: Secondary | ICD-10-CM

## 2012-10-01 DIAGNOSIS — N951 Menopausal and female climacteric states: Secondary | ICD-10-CM

## 2012-10-01 DIAGNOSIS — E039 Hypothyroidism, unspecified: Secondary | ICD-10-CM

## 2012-10-01 DIAGNOSIS — F341 Dysthymic disorder: Secondary | ICD-10-CM

## 2012-10-01 DIAGNOSIS — I10 Essential (primary) hypertension: Secondary | ICD-10-CM

## 2012-10-01 DIAGNOSIS — R946 Abnormal results of thyroid function studies: Secondary | ICD-10-CM

## 2012-10-01 DIAGNOSIS — F418 Other specified anxiety disorders: Secondary | ICD-10-CM

## 2012-10-01 DIAGNOSIS — R7989 Other specified abnormal findings of blood chemistry: Secondary | ICD-10-CM

## 2012-10-01 LAB — T4, FREE: Free T4: 1.2 ng/dL (ref 0.80–1.80)

## 2012-10-01 LAB — TSH: TSH: 0.011 u[IU]/mL — ABNORMAL LOW (ref 0.350–4.500)

## 2012-10-01 MED ORDER — VENLAFAXINE HCL ER 150 MG PO CP24: 150.0000 mg | ORAL_CAPSULE | Freq: Every day | ORAL | Status: DC

## 2012-10-01 NOTE — Patient Instructions (Addendum)
Continue probiotics Add a yogurt daily  add Benefiber powder twice a day If symptoms no better call and we will retest the H Pylori with a breath test  Can try OTC product called ICool for hot flashes eat small, frequent meals. Minimize sweet tea and simple carbs   Irritable Bowel Syndrome Irritable Bowel Syndrome (IBS) is caused by a disturbance of normal bowel function. Other terms used are spastic colon, mucous colitis, and irritable colon. It does not require surgery, nor does it lead to cancer. There is no cure for IBS. But with proper diet, stress reduction, and medication, you will find that your problems (symptoms) will gradually disappear or improve. IBS is a common digestive disorder. It usually appears in late adolescence or early adulthood. Women develop it twice as often as men. CAUSES  After food has been digested and absorbed in the small intestine, waste material is moved into the colon (large intestine). In the colon, water and salts are absorbed from the undigested products coming from the small intestine. The remaining residue, or fecal material, is held for elimination. Under normal circumstances, gentle, rhythmic contractions on the bowel walls push the fecal material along the colon towards the rectum. In IBS, however, these contractions are irregular and poorly coordinated. The fecal material is either retained too long, resulting in constipation, or expelled too soon, producing diarrhea. SYMPTOMS  The most common symptom of IBS is pain. It is typically in the lower left side of the belly (abdomen). But it may occur anywhere in the abdomen. It can be felt as heartburn, backache, or even as a dull pain in the arms or shoulders. The pain comes from excessive bowel-muscle spasms and from the buildup of gas and fecal material in the colon. This pain:  Can range from sharp belly (abdominal) cramps to a dull, continuous ache.  Usually worsens soon after eating.  Is typically  relieved by having a bowel movement or passing gas. Abdominal pain is usually accompanied by constipation. But it may also produce diarrhea. The diarrhea typically occurs right after a meal or upon arising in the morning. The stools are typically soft and watery. They are often flecked with secretions (mucus). Other symptoms of IBS include:  Bloating.  Loss of appetite.  Heartburn.  Feeling sick to your stomach (nausea).  Belching  Vomiting  Gas. IBS may also cause a number of symptoms that are unrelated to the digestive system:  Fatigue.  Headaches.  Anxiety  Shortness of breath  Difficulty in concentrating.  Dizziness. These symptoms tend to come and go. DIAGNOSIS  The symptoms of IBS closely mimic the symptoms of other, more serious digestive disorders. So your caregiver may wish to perform a variety of additional tests to exclude these disorders. He/she wants to be certain of learning what is wrong (diagnosis). The nature and purpose of each test will be explained to you. TREATMENT A number of medications are available to help correct bowel function and/or relieve bowel spasms and abdominal pain. Among the drugs available are:  Mild, non-irritating laxatives for severe constipation and to help restore normal bowel habits.  Specific anti-diarrheal medications to treat severe or prolonged diarrhea.  Anti-spasmodic agents to relieve intestinal cramps.  Your caregiver may also decide to treat you with a mild tranquilizer or sedative during unusually stressful periods in your life. The important thing to remember is that if any drug is prescribed for you, make sure that you take it exactly as directed. Make sure that your caregiver  knows how well it worked for you. HOME CARE INSTRUCTIONS   Avoid foods that are high in fat or oils. Some examples WUJ:[id number] cream, butter, frankfurters, sausage, and other fatty meats.  Avoid foods that have a laxative effect, such as fruit,  fruit juice, and dairy products.  Cut out carbonated drinks, chewing gum, and "gassy" foods, such as beans and cabbage. This may help relieve bloating and belching.  Bran taken with plenty of liquids may help relieve constipation.  Keep track of what foods seem to trigger your symptoms.  Avoid emotionally charged situations or circumstances that produce anxiety.  Start or continue exercising.  Get plenty of rest and sleep. MAKE SURE YOU:   Understand these instructions.  Will watch your condition.  Will get help right away if you are not doing well or get worse. Document Released: 04/10/2005 Document Revised: 07/03/2011 Document Reviewed: 11/29/2007 [facility] Patient Information 2014 [place], [place]. DASH Diet The DASH diet stands for "Dietary Approaches to Stop Hypertension." It is a healthy eating plan that has been shown to reduce high blood pressure (hypertension) in as little as 14 days, while also possibly providing other significant health benefits. These other health benefits include reducing the risk of breast cancer after menopause and reducing the risk of type 2 diabetes, heart disease, colon cancer, and stroke. Health benefits also include weight loss and slowing kidney failure in patients with chronic kidney disease.  DIET GUIDELINES  Limit salt (sodium). Your diet should contain less than 1500 mg of sodium daily.  Limit refined or processed carbohydrates. Your diet should include mostly whole grains. Desserts and added sugars should be used sparingly.  Include small amounts of heart-healthy fats. These types of fats include nuts, oils, and tub margarine. Limit saturated and trans fats. These fats have been shown to be harmful in the body. CHOOSING FOODS  The following food groups are based on a 2000 calorie diet. See your Registered Dietitian for individual calorie needs. Grains and Grain Products (6 to 8 servings daily)  Eat More Often: Whole-wheat bread, brown rice,  whole-grain or wheat pasta, quinoa, popcorn without added fat or salt (air popped).  Eat Less Often: White bread, white pasta, white rice, cornbread. Vegetables (4 to 5 servings daily)  Eat More Often: Fresh, frozen, and canned vegetables. Vegetables may be raw, steamed, roasted, or grilled with a minimal amount of fat.  Eat Less Often/Avoid: Creamed or fried vegetables. Vegetables in a cheese sauce. Fruit (4 to 5 servings daily)  Eat More Often: All fresh, canned (in natural juice), or frozen fruits. Dried fruits without added sugar. One hundred percent fruit juice ( cup [237 mL] daily).  Eat Less Often: Dried fruits with added sugar. Canned fruit in light or heavy syrup. [employer], Fish, and Poultry (2 servings or less daily. One serving is 3 to 4 oz [85-114 g]).  Eat More Often: Ninety percent or leaner ground beef, tenderloin, sirloin. Round cuts of beef, chicken breast, [place] breast. All fish. Grill, bake, or broil your meat. Nothing should be fried.  Eat Less Often/Avoid: Fatty cuts of meat, [place], or chicken leg, thigh, or wing. Fried cuts of meat or fish. Dairy (2 to 3 servings)  Eat More Often: Low-fat or fat-free milk, low-fat plain or light yogurt, reduced-fat or part-skim cheese.  Eat Less Often/Avoid: Milk (whole, 2%).Whole milk yogurt. Full-fat cheeses. Nuts, Seeds, and Legumes (4 to 5 servings per week)  Eat More Often: All without added salt.  Eat Less Often/Avoid: Salted nuts  and seeds, canned beans with added salt. Fats and Sweets (limited)  Eat More Often: Vegetable oils, tub margarines without trans fats, sugar-free gelatin. Mayonnaise and salad dressings.  Eat Less Often/Avoid: Coconut oils, palm oils, butter, stick margarine, cream, half and half, cookies, candy, pie. FOR MORE INFORMATION The Dash Diet Eating Plan: www.dashdiet.org Document Released: 03/30/2011 Document Revised: 07/03/2011 Document Reviewed: 03/30/2011 [facility] Patient Information 2014  [place], [place].

## 2012-10-01 NOTE — Progress Notes (Signed)
Patient ID: [name], female   DOB: [birth year], 52 y.o.   MRN: [id number] LEVY WELLMAN [id number] [birth year] 10/01/2012      Progress Note-Follow Up  Subjective  Chief Complaint  Chief Complaint  Patient presents with  . Follow-up    2 month    HPI  Patient is a 52 year old Caucasian female who is in today for followup. She is noting some recent trouble with hot flashes. They're tolerable but occurring several times a day. Noted she had a slight rash after a course of antibiotics but was really only in the back of her calf and has resolved. Her reflux is somewhat better but she does continue to struggle with some nausea and occasionally has to strain and moving her bowels. No bloody or tarry stool. No recent illness. No fevers or chills. No GU complaints. Depression has been somewhat worse recently but no suicidal ideation  Past Medical History  Diagnosis Date  . GERD (gastroesophageal reflux disease)   . Hypertension   . Thyroid disease     hypothyroidism  . Anxiety   . Depression   . Hyperlipidemia   . Chronic rhinosinusitis   . OSA (obstructive sleep apnea)   . Obesity   . Colon polyps   . Fibroid, uterine   . Hoarseness   . Glaucoma 12-13  . IBS (irritable bowel syndrome) 02/13/2011    Past Surgical History  Procedure Laterality Date  . Cholecystectomy    . Abdominal hysterectomy      Family History  Problem Relation Age of Onset  . Alcohol abuse Father   . Cancer Father     renal and colon  . Hyperlipidemia Father   . Hypertension Father   . Diabetes    . Cancer Paternal Grandfather     colon    History   Social History  . Marital Status: Single    Spouse Name: N/A    Number of Children: N/A  . Years of Education: N/A   Occupational History  . Not on file.   Social History Main Topics  . Smoking status: Former Smoker -- 0.50 packs/day for 30 years    Types: Cigarettes    Quit date: 04/25/2003  . Smokeless tobacco: Never Used   Comment: smoked since age 1  . Alcohol Use: No  . Drug Use: Not on file  . Sexually Active: Not on file   Other Topics Concern  . Not on file   Social History Narrative   Endo-- Dr Lucianne Muss   ENT--Dr shoemaker   GI--Dr Buccini   Pulm--Dr Clance   Rheum--Dr Kellie Simmering          Current Outpatient Prescriptions on File Prior to Visit  Medication Sig Dispense Refill  . ALPRAZolam (XANAX) 0.25 MG tablet Take 1 tablet (0.25 mg total) by mouth 2 (two) times daily as needed for sleep.  20 tablet  0  . b complex vitamins tablet Take 1 tablet by mouth daily.      . Beta Carotene (VITAMIN A) 25000 UNIT capsule Take 25,000 Units by mouth daily.      . cholecalciferol (VITAMIN D-400) 400 UNITS TABS Take 400 Units by mouth daily.      . clotrimazole (LOTRIMIN) 1 % cream Apply topically 2 (two) times daily.  60 g  1  . diphenhydrAMINE (BENADRYL) 25 MG tablet Take 25 mg by mouth at bedtime as needed.        Marland Kitchen estradiol (ESTRACE) 1 MG tablet Take 1  mg by mouth daily.        Marland Kitchen KLOR-CON M20 20 MEQ tablet TAKE 1 TABLET BY MOUTH DAILY  90 tablet  PRN  . latanoprost (XALATAN) 0.005 % ophthalmic solution Place 1 drop into both eyes at bedtime.      Marland Kitchen levothyroxine (SYNTHROID, LEVOTHROID) 75 MCG tablet Take 1 1/2 tablets on Tuesday and Thursday and 1 tablet all other days.  34 tablet  5  . loratadine-pseudoephedrine (ALLERGY/CONGESTION RELIEF) 10-240 MG per 24 hr tablet Take 1 tablet by mouth daily.        Marland Kitchen losartan-hydrochlorothiazide (HYZAAR) 50-12.5 MG per tablet TAKE 1 TABLET BY MOUTH DAILY  90 tablet  1  . metFORMIN (GLUCOPHAGE-XR) 500 MG 24 hr tablet Take 500 mg by mouth daily with breakfast.      . omeprazole-sodium bicarbonate (ZEGERID) 40-1100 MG per capsule Take 1 capsule by mouth daily.        Marland Kitchen tetracycline (ACHROMYCIN,SUMYCIN) 500 MG capsule Take 1 capsule (500 mg total) by mouth 4 (four) times daily. X 14 days  56 capsule  0  . vitamin E 400 UNIT capsule Take 800 Units by mouth daily.       No  current facility-administered medications on file prior to visit.    Allergies  Allergen Reactions  . Aspirin     REACTION: Upset stomach  . Amoxicillin Rash    Review of Systems  Review of Systems  Constitutional: Negative for fever and malaise/fatigue.  HENT: Negative for congestion.   Eyes: Negative for discharge.  Respiratory: Negative for shortness of breath.   Cardiovascular: Negative for chest pain, palpitations and leg swelling.  Gastrointestinal: Positive for nausea, abdominal pain, diarrhea and constipation. Negative for vomiting and melena.  Genitourinary: Negative for dysuria.  Musculoskeletal: Negative for falls.  Skin: Negative for rash.  Neurological: Negative for loss of consciousness and headaches.  Endo/Heme/Allergies: Negative for polydipsia.  Psychiatric/Behavioral: Negative for depression and suicidal ideas. The patient is not nervous/anxious and does not have insomnia.     Objective  BP 138/82  Pulse 93  Temp(Src) 98.1 F (36.7 C) (Oral)  Ht 5\' 4"  (1.626 m)  Wt 232 lb (105.235 kg)  BMI 39.8 kg/m2  SpO2 96%  Physical Exam  Physical Exam  Constitutional: She is oriented to person, place, and time and well-developed, well-nourished, and in no distress. No distress.  HENT:  Head: Normocephalic and atraumatic.  Eyes: Conjunctivae are normal.  Neck: Neck supple. No thyromegaly present.  Cardiovascular: Normal rate, regular rhythm and normal heart sounds.   No murmur heard. Pulmonary/Chest: Effort normal and breath sounds normal. She has no wheezes.  Abdominal: She exhibits no distension and no mass.  Musculoskeletal: She exhibits no edema.  Lymphadenopathy:    She has no cervical adenopathy.  Neurological: She is alert and oriented to person, place, and time.  Skin: Skin is warm and dry. No rash noted. She is not diaphoretic.  Psychiatric: Memory, affect and judgment normal.    Lab Results  Component Value Date   TSH 0.26* 07/25/2012   Lab  Results  Component Value Date   WBC 4.7 03/25/2012   HGB 13.4 03/25/2012   HCT 39.5 03/25/2012   MCV 89.8 03/25/2012   PLT 309.0 03/25/2012   Lab Results  Component Value Date   CREATININE 1.0 07/25/2012   BUN 11 07/25/2012   NA 134* 07/25/2012   K 3.5 07/25/2012   CL 101 07/25/2012   CO2 25 07/25/2012   Lab Results  Component Value Date   ALT 17 03/25/2012   AST 21 03/25/2012   ALKPHOS 46 03/25/2012   BILITOT 0.3 03/25/2012   Lab Results  Component Value Date   CHOL 170 03/25/2012   Lab Results  Component Value Date   HDL 46.60 03/25/2012   Lab Results  Component Value Date   LDLCALC 104* 03/25/2012   Lab Results  Component Value Date   TRIG 97.0 03/25/2012   Lab Results  Component Value Date   CHOLHDL 4 03/25/2012     Assessment & Plan  HYPERTENSION Well controlled no changes.  GERD Using Benefiber and probiotics and feeling somwhat better but still struggling with some nausea. Encouraged ginger prn and avoid offending foods.  Perimenopause Struggling with some hot flashes, encouraged small frequent meals with proteins. Regular exercise. 8 hours of sleep. Good hydration. Avoid caffeine, alcohol and hot liquids. May try Icool and if no improvement let us know  HYPOTHYROIDISM Doing well on current dose of levothyroxine  HYPERLIPIDEMIA Avoid trans fats, add a krill oil cap daily  ADJ DISORDER WITH MIXED ANXIETY & DEPRESSED MOOD Struggling with depression, encouraged increase Venlafaxine.

## 2012-10-03 NOTE — Progress Notes (Signed)
Quick Note:  Patient Informed and voiced understanding ______ 

## 2012-10-05 ENCOUNTER — Encounter: Payer: Self-pay | Admitting: Family Medicine

## 2012-10-05 DIAGNOSIS — N951 Menopausal and female climacteric states: Secondary | ICD-10-CM

## 2012-10-05 HISTORY — DX: Menopausal and female climacteric states: N95.1

## 2012-10-05 NOTE — Assessment & Plan Note (Signed)
Doing well on current dose of levothyroxine

## 2012-10-05 NOTE — Assessment & Plan Note (Signed)
Avoid trans fats, add a krill oil cap daily

## 2012-10-05 NOTE — Assessment & Plan Note (Signed)
Struggling with some hot flashes, encouraged small frequent meals with proteins. Regular exercise. 8 hours of sleep. Good hydration. Avoid caffeine, alcohol and hot liquids. May try Icool and if no improvement let us know

## 2012-10-05 NOTE — Assessment & Plan Note (Signed)
Well controlled no changes 

## 2012-10-05 NOTE — Assessment & Plan Note (Signed)
Struggling with depression, encouraged increase Venlafaxine.

## 2012-10-05 NOTE — Assessment & Plan Note (Signed)
Using Benefiber and probiotics and feeling somwhat better but still struggling with some nausea. Encouraged ginger prn and avoid offending foods.

## 2012-10-28 ENCOUNTER — Encounter: Payer: Self-pay | Admitting: Family Medicine

## 2012-10-29 MED ORDER — THYROID 90 MG PO TABS: 90.0000 mg | ORAL_TABLET | Freq: Every day | ORAL | Status: DC

## 2012-10-29 NOTE — Addendum Note (Signed)
Addended by: Court Joy on: 10/29/2012 08:06 AM   Modules accepted: Orders, Medications

## 2012-10-31 ENCOUNTER — Other Ambulatory Visit: Payer: Self-pay

## 2012-11-01 ENCOUNTER — Telehealth: Payer: Self-pay | Admitting: Family Medicine

## 2012-11-01 DIAGNOSIS — E785 Hyperlipidemia, unspecified: Secondary | ICD-10-CM

## 2012-11-01 DIAGNOSIS — I10 Essential (primary) hypertension: Secondary | ICD-10-CM

## 2012-11-01 DIAGNOSIS — R7309 Other abnormal glucose: Secondary | ICD-10-CM

## 2012-11-01 DIAGNOSIS — E559 Vitamin D deficiency, unspecified: Secondary | ICD-10-CM

## 2012-11-01 DIAGNOSIS — E039 Hypothyroidism, unspecified: Secondary | ICD-10-CM

## 2012-11-01 NOTE — Telephone Encounter (Signed)
Patient states that she is supposed to return for labs in September. She states that she will be going to the [facility] lab. Please order labs

## 2012-11-18 ENCOUNTER — Other Ambulatory Visit: Payer: Self-pay | Admitting: Internal Medicine

## 2012-11-18 NOTE — Telephone Encounter (Signed)
Rx request to pharmacy/SLS  

## 2012-12-02 ENCOUNTER — Ambulatory Visit: Payer: Self-pay | Admitting: Family Medicine

## 2012-12-11 ENCOUNTER — Encounter: Payer: Self-pay | Admitting: Family Medicine

## 2012-12-11 ENCOUNTER — Other Ambulatory Visit: Payer: Self-pay | Admitting: Family Medicine

## 2012-12-11 DIAGNOSIS — F411 Generalized anxiety disorder: Secondary | ICD-10-CM

## 2012-12-11 MED ORDER — OMEPRAZOLE-SODIUM BICARBONATE 40-1100 MG PO CAPS: 1.0000 | ORAL_CAPSULE | Freq: Every day | ORAL | Status: DC

## 2012-12-11 NOTE — Telephone Encounter (Signed)
Ok to send #20 zero refills. 

## 2012-12-11 NOTE — Telephone Encounter (Signed)
Please advise refill? Last RX was done on 07-30-12 quantity 20 with 0 refills

## 2012-12-12 MED ORDER — ALPRAZOLAM 0.25 MG PO TABS: 0.2500 mg | ORAL_TABLET | Freq: Two times a day (BID) | ORAL | Status: DC | PRN

## 2012-12-12 NOTE — Telephone Encounter (Signed)
Rx called to pharmacist as below. 

## 2012-12-13 NOTE — Telephone Encounter (Signed)
I don't see documentation of labs needed in prior two OV notes/SLS

## 2012-12-16 NOTE — Telephone Encounter (Signed)
Lab order placed.

## 2012-12-16 NOTE — Telephone Encounter (Signed)
Needs tsh, free t4, hgba1c, vitamin d, lipid, renal, cbc, hepatic for hyperglycemia, low vitamin d, hyponatremia and hyperlipidemia

## 2012-12-16 NOTE — Telephone Encounter (Signed)
Please advise 

## 2012-12-17 ENCOUNTER — Encounter: Payer: Self-pay | Admitting: Family Medicine

## 2012-12-18 ENCOUNTER — Encounter: Payer: Self-pay | Admitting: Family Medicine

## 2012-12-18 ENCOUNTER — Ambulatory Visit (INDEPENDENT_AMBULATORY_CARE_PROVIDER_SITE_OTHER): Payer: 59

## 2012-12-18 DIAGNOSIS — R7309 Other abnormal glucose: Secondary | ICD-10-CM

## 2012-12-18 DIAGNOSIS — I10 Essential (primary) hypertension: Secondary | ICD-10-CM

## 2012-12-18 DIAGNOSIS — E039 Hypothyroidism, unspecified: Secondary | ICD-10-CM

## 2012-12-18 DIAGNOSIS — E559 Vitamin D deficiency, unspecified: Secondary | ICD-10-CM

## 2012-12-18 DIAGNOSIS — E785 Hyperlipidemia, unspecified: Secondary | ICD-10-CM

## 2012-12-18 LAB — CBC
HCT: 40.2 % (ref 36.0–46.0)
Hemoglobin: 13.8 g/dL (ref 12.0–15.0)
MCHC: 34.4 g/dL (ref 30.0–36.0)
MCV: 86 fl (ref 78.0–100.0)
Platelets: 326 10*3/uL (ref 150.0–400.0)
RBC: 4.68 Mil/uL (ref 3.87–5.11)
RDW: 13.7 % (ref 11.5–14.6)
WBC: 5.3 10*3/uL (ref 4.5–10.5)

## 2012-12-18 LAB — HEMOGLOBIN A1C: Hgb A1c MFr Bld: 5.9 % (ref 4.6–6.5)

## 2012-12-19 ENCOUNTER — Telehealth: Payer: Self-pay

## 2012-12-19 ENCOUNTER — Other Ambulatory Visit (INDEPENDENT_AMBULATORY_CARE_PROVIDER_SITE_OTHER): Payer: 59

## 2012-12-19 ENCOUNTER — Encounter: Payer: Self-pay | Admitting: Family Medicine

## 2012-12-19 DIAGNOSIS — E039 Hypothyroidism, unspecified: Secondary | ICD-10-CM

## 2012-12-19 DIAGNOSIS — I10 Essential (primary) hypertension: Secondary | ICD-10-CM

## 2012-12-19 DIAGNOSIS — R7309 Other abnormal glucose: Secondary | ICD-10-CM

## 2012-12-19 DIAGNOSIS — E785 Hyperlipidemia, unspecified: Secondary | ICD-10-CM

## 2012-12-19 LAB — LIPID PANEL
Cholesterol: 190 mg/dL (ref 0–200)
HDL: 46.3 mg/dL (ref >39.00)
Total CHOL/HDL Ratio: 4
Triglycerides: 241 mg/dL — ABNORMAL HIGH (ref 0.0–149.0)
VLDL: 48.2 mg/dL — ABNORMAL HIGH (ref 0.0–40.0)

## 2012-12-19 LAB — RENAL FUNCTION PANEL
Albumin: 3.9 g/dL (ref 3.5–5.2)
BUN: 10 mg/dL (ref 6–23)
CO2: 26 mEq/L (ref 19–32)
Calcium: 9.4 mg/dL (ref 8.4–10.5)
Chloride: 103 mEq/L (ref 96–112)
Creatinine, Ser: 0.9 mg/dL (ref 0.4–1.2)
GFR: 84.54 mL/min (ref >60.00)
Glucose, Bld: 82 mg/dL (ref 70–99)
Phosphorus: 3.6 mg/dL (ref 2.3–4.6)
Potassium: 3.9 mEq/L (ref 3.5–5.1)
Sodium: 137 mEq/L (ref 135–145)

## 2012-12-19 LAB — HEPATIC FUNCTION PANEL
ALT: 23 U/L (ref 0–35)
AST: 26 U/L (ref 0–37)
Albumin: 3.9 g/dL (ref 3.5–5.2)
Alkaline Phosphatase: 53 U/L (ref 39–117)
Bilirubin, Direct: 0 mg/dL (ref 0.0–0.3)
Total Bilirubin: 0.3 mg/dL (ref 0.3–1.2)
Total Protein: 7.2 g/dL (ref 6.0–8.3)

## 2012-12-19 LAB — VITAMIN D 25 HYDROXY (VIT D DEFICIENCY, FRACTURES): Vit D, 25-Hydroxy: 38 ng/mL (ref 30–89)

## 2012-12-19 LAB — SEDIMENTATION RATE: Sed Rate: 22 mm/hr (ref 0–22)

## 2012-12-19 MED ORDER — RANITIDINE HCL 300 MG PO TABS: 300.0000 mg | ORAL_TABLET | Freq: Every day | ORAL | Status: DC

## 2012-12-19 NOTE — Telephone Encounter (Signed)
Labs ordered and message sent to patient.  RX ordered also

## 2012-12-19 NOTE — Telephone Encounter (Signed)
FYI: I called [facility] and they are going to add the Sed Rate

## 2012-12-20 ENCOUNTER — Encounter: Payer: Self-pay | Admitting: Family Medicine

## 2012-12-20 LAB — LDL CHOLESTEROL, DIRECT: Direct LDL: 118.3 mg/dL

## 2012-12-24 LAB — T4, FREE: Free T4: 0.74 ng/dL (ref 0.60–1.60)

## 2012-12-24 LAB — TSH: TSH: 0.35 u[IU]/mL (ref 0.35–5.50)

## 2013-01-06 ENCOUNTER — Telehealth: Payer: Self-pay

## 2013-01-06 ENCOUNTER — Encounter: Payer: Self-pay | Admitting: Family Medicine

## 2013-01-06 ENCOUNTER — Ambulatory Visit (INDEPENDENT_AMBULATORY_CARE_PROVIDER_SITE_OTHER): Payer: 59 | Admitting: Family Medicine

## 2013-01-06 VITALS — BP 160/102 | HR 109 | Temp 98.4°F | Ht 64.0 in | Wt 237.1 lb

## 2013-01-06 DIAGNOSIS — E119 Type 2 diabetes mellitus without complications: Secondary | ICD-10-CM | POA: Insufficient documentation

## 2013-01-06 DIAGNOSIS — Z23 Encounter for immunization: Secondary | ICD-10-CM

## 2013-01-06 DIAGNOSIS — E669 Obesity, unspecified: Secondary | ICD-10-CM | POA: Insufficient documentation

## 2013-01-06 DIAGNOSIS — G894 Chronic pain syndrome: Secondary | ICD-10-CM

## 2013-01-06 DIAGNOSIS — E1169 Type 2 diabetes mellitus with other specified complication: Secondary | ICD-10-CM

## 2013-01-06 DIAGNOSIS — R350 Frequency of micturition: Secondary | ICD-10-CM

## 2013-01-06 DIAGNOSIS — R609 Edema, unspecified: Secondary | ICD-10-CM

## 2013-01-06 DIAGNOSIS — K219 Gastro-esophageal reflux disease without esophagitis: Secondary | ICD-10-CM

## 2013-01-06 DIAGNOSIS — I10 Essential (primary) hypertension: Secondary | ICD-10-CM

## 2013-01-06 DIAGNOSIS — R05 Cough: Secondary | ICD-10-CM | POA: Insufficient documentation

## 2013-01-06 DIAGNOSIS — R059 Cough, unspecified: Secondary | ICD-10-CM

## 2013-01-06 DIAGNOSIS — H409 Unspecified glaucoma: Secondary | ICD-10-CM

## 2013-01-06 DIAGNOSIS — E039 Hypothyroidism, unspecified: Secondary | ICD-10-CM

## 2013-01-06 HISTORY — DX: Type 2 diabetes mellitus with other specified complication: E66.9

## 2013-01-06 HISTORY — DX: Edema, unspecified: R60.9

## 2013-01-06 HISTORY — DX: Type 2 diabetes mellitus with other specified complication: E11.69

## 2013-01-06 HISTORY — DX: Chronic pain syndrome: G89.4

## 2013-01-06 HISTORY — DX: Type 2 diabetes mellitus without complications: E11.9

## 2013-01-06 HISTORY — DX: Cough, unspecified: R05.9

## 2013-01-06 MED ORDER — FUROSEMIDE 20 MG PO TABS: 20.0000 mg | ORAL_TABLET | Freq: Every day | ORAL | Status: DC | PRN

## 2013-01-06 MED ORDER — THYROID 120 MG PO TABS: 120.0000 mg | ORAL_TABLET | Freq: Every day | ORAL | Status: DC

## 2013-01-06 MED ORDER — METOPROLOL TARTRATE 25 MG PO TABS: 25.0000 mg | ORAL_TABLET | Freq: Two times a day (BID) | ORAL | Status: DC

## 2013-01-06 NOTE — Assessment & Plan Note (Signed)
T4 dropped with switch to armour thyroid will increase to 120 mg daily

## 2013-01-06 NOTE — Telephone Encounter (Signed)
Patient needs test strips and lancets [facility]?  I need to know which glucometer was given to her

## 2013-01-06 NOTE — Assessment & Plan Note (Signed)
Add Metoprolol 25 mg po bid

## 2013-01-06 NOTE — Assessment & Plan Note (Signed)
Encouraged compression hose in am and elevate feet above heart as needed. Lasix prescribed prn to use as well. DASH diet

## 2013-01-06 NOTE — Assessment & Plan Note (Signed)
Improved but persistent, likely multifactorial, avoid offending foods and reassess

## 2013-01-06 NOTE — Assessment & Plan Note (Addendum)
hgba1c acceptable but patient already on Metformin, she agrees to attend a diabetic course at [facility] hospital and to start monitoring her sugars. Minimize simple carbs. Does not see podiatry, foot exam today unremarkable except for thick cracking skin on heals.

## 2013-01-06 NOTE — Progress Notes (Signed)
Patient ID: [name], female   DOB: 1961/04/17, 52 y.o.   MRN: [id number] [name] [id number] 1960/05/27 01/06/2013      Progress Note-Follow Up  Subjective  Chief Complaint  Chief Complaint  Patient presents with  . Follow-up    2 month  . Injections    pneumococcal and flu    HPI  Patient is a 52 year old [place] American female who is in today with numerous complaints once again. She's having persistent abdominal bloating and discomfort. Reflux and dyspepsia are also noted. She was unable to tolerate the full course of H. pylori treatment. Has been taking Zegerid daily but still has breakthrough symptoms. Also complains of fatigue. Complaints of overall generalized myalgias and malaise. She continues to have some allergic symptoms with chronic cough but it is nonproductive. No fevers or chills. Has seen Dr Hazle Quant of Opthamology, Dr Annalee Genta of ENT, Dr Kellie Simmering of Rheumatology, Dr Anne Hahn of [facility].  Past Medical History  Diagnosis Date  . GERD (gastroesophageal reflux disease)   . Hypertension   . Thyroid disease     hypothyroidism  . Anxiety   . Depression   . Hyperlipidemia   . Chronic rhinosinusitis   . OSA (obstructive sleep apnea)   . Obesity   . Colon polyps   . Fibroid, uterine   . Hoarseness   . Glaucoma 12-13  . IBS (irritable bowel syndrome) 02/13/2011  . Perimenopause 10/05/2012  . Edema 01/06/2013  . Diabetes 01/06/2013  . Chronic pain syndrome 01/06/2013  . Cough 01/06/2013    Past Surgical History  Procedure Laterality Date  . Cholecystectomy    . Abdominal hysterectomy      Family History  Problem Relation Age of Onset  . Alcohol abuse Father   . Cancer Father     renal and colon  . Hyperlipidemia Father   . Hypertension Father   . Diabetes    . Cancer Paternal Grandfather     colon    History   Social History  . Marital Status: Single    Spouse Name: N/A    Number of Children: N/A  . Years of Education: N/A    Occupational History  . Not on file.   Social History Main Topics  . Smoking status: Former Smoker -- 0.50 packs/day for 30 years    Types: Cigarettes    Quit date: 04/25/2003  . Smokeless tobacco: Never Used     Comment: smoked since age 45  . Alcohol Use: No  . Drug Use: Not on file  . Sexual Activity: Not on file   Other Topics Concern  . Not on file   Social History Narrative   Endo-- Dr Lucianne Muss   ENT--Dr shoemaker   GI--Dr Buccini   Pulm--Dr Clance   Rheum--Dr Kellie Simmering          Current Outpatient Prescriptions on File Prior to Visit  Medication Sig Dispense Refill  . ALPRAZolam (XANAX) 0.25 MG tablet Take 1 tablet (0.25 mg total) by mouth 2 (two) times daily as needed for sleep.  20 tablet  0  . b complex vitamins tablet Take 1 tablet by mouth daily.      . Beta Carotene (VITAMIN A) 25000 UNIT capsule Take 25,000 Units by mouth daily.      . cholecalciferol (VITAMIN D-400) 400 UNITS TABS Take 400 Units by mouth daily.      . clotrimazole (LOTRIMIN) 1 % cream Apply topically 2 (two) times daily.  60 g  1  . diphenhydrAMINE (BENADRYL) 25 MG tablet Take 25 mg by mouth at bedtime as needed.        Marland Kitchen estradiol (ESTRACE) 1 MG tablet Take 1 mg by mouth daily.        Marland Kitchen KLOR-CON M20 20 MEQ tablet TAKE 1 TABLET BY MOUTH DAILY  90 tablet  PRN  . latanoprost (XALATAN) 0.005 % ophthalmic solution Place 1 drop into both eyes at bedtime.      Marland Kitchen loratadine-pseudoephedrine (ALLERGY/CONGESTION RELIEF) 10-240 MG per 24 hr tablet Take 1 tablet by mouth daily.        Marland Kitchen losartan-hydrochlorothiazide (HYZAAR) 50-12.5 MG per tablet TAKE 1 TABLET BY MOUTH DAILY  90 tablet  1  . metFORMIN (GLUCOPHAGE-XR) 500 MG 24 hr tablet Take 500 mg by mouth daily with breakfast.      . omeprazole-sodium bicarbonate (ZEGERID) 40-1100 MG per capsule Take 1 capsule by mouth daily.  30 capsule  4  . ranitidine (ZANTAC) 300 MG tablet Take 1 tablet (300 mg total) by mouth at bedtime.  30 tablet  2  . venlafaxine  XR (EFFEXOR XR) 150 MG 24 hr capsule Take 1 capsule (150 mg total) by mouth daily.  30 capsule  3   No current facility-administered medications on file prior to visit.    Allergies  Allergen Reactions  . Aspirin     REACTION: Upset stomach  . Amoxicillin Rash    Review of Systems  Review of Systems  Constitutional: Positive for malaise/fatigue. Negative for fever.  HENT: Positive for congestion.   Eyes: Negative for discharge.  Respiratory: Positive for cough. Negative for shortness of breath.   Cardiovascular: Negative for chest pain, palpitations and leg swelling.  Gastrointestinal: Positive for heartburn and abdominal pain. Negative for nausea, diarrhea, constipation, blood in stool and melena.  Genitourinary: Negative for dysuria.  Musculoskeletal: Positive for myalgias and back pain. Negative for falls.  Skin: Negative for rash.  Neurological: Negative for loss of consciousness and headaches.  Endo/Heme/Allergies: Negative for polydipsia.  Psychiatric/Behavioral: Negative for depression and suicidal ideas. The patient is not nervous/anxious and does not have insomnia.     Objective  BP 160/102  Pulse 109  Temp(Src) 98.4 F (36.9 C) (Oral)  Ht 5\' 4"  (1.626 m)  Wt 237 lb 1.9 oz (107.557 kg)  BMI 40.68 kg/m2  SpO2 97%  Physical Exam  Physical Exam  Constitutional: She is oriented to person, place, and time and well-developed, well-nourished, and in no distress. No distress.  HENT:  Head: Normocephalic and atraumatic.  Eyes: Conjunctivae are normal.  Neck: Neck supple. No thyromegaly present.  Cardiovascular: Normal rate, regular rhythm and normal heart sounds.   No murmur heard. Pulmonary/Chest: Effort normal and breath sounds normal. She has no wheezes.  Abdominal: She exhibits no distension and no mass.  Musculoskeletal: She exhibits no edema.  Lymphadenopathy:    She has no cervical adenopathy.  Neurological: She is alert and oriented to person, place, and  time.  Skin: Skin is warm and dry. No rash noted. She is not diaphoretic.  Skin intact b/l feet, thick cracked skin on heals, no hair but pt and dp pulses 2 + b/l. Sensation intact  Psychiatric: Memory, affect and judgment normal.    Lab Results  Component Value Date   TSH 0.35 12/18/2012   Lab Results  Component Value Date   WBC 5.3 12/18/2012   HGB 13.8 12/18/2012   HCT 40.2 12/18/2012   MCV 86.0 12/18/2012   PLT 326.0  12/18/2012   Lab Results  Component Value Date   CREATININE 0.9 12/18/2012   BUN 10 12/18/2012   NA 137 12/18/2012   K 3.9 12/18/2012   CL 103 12/18/2012   CO2 26 12/18/2012   Lab Results  Component Value Date   ALT 23 12/18/2012   AST 26 12/18/2012   ALKPHOS 53 12/18/2012   BILITOT 0.3 12/18/2012   Lab Results  Component Value Date   CHOL 190 12/18/2012   Lab Results  Component Value Date   HDL 46.30 12/18/2012   Lab Results  Component Value Date   LDLCALC 104* 03/25/2012   Lab Results  Component Value Date   TRIG 241.0* 12/18/2012   Lab Results  Component Value Date   CHOLHDL 4 12/18/2012     Assessment & Plan  HYPERTENSION Add Metoprolol 25 mg po bid  Edema Encouraged compression hose in am and elevate feet above heart as needed. Lasix prescribed prn to use as well. DASH diet  Diabetes hgba1c acceptable but patient already on Metformin, she agrees to attend a diabetic course at [facility] hospital and to start monitoring her sugars. Minimize simple carbs. Does not see podiatry, foot exam today unremarkable except for thick cracking skin on heals.   HYPOTHYROIDISM T4 dropped with switch to armour thyroid will increase to 120 mg daily   Chronic pain syndrome Diffuse, symmetric myalgias. Will repeat CPK and consider increasing dose of Effexor in past. May use NSAIDS prn  Glaucoma Following with Dr Hazle Quant  GERD Request records from Dr Ronney Asters to see when last upper endoscopy was. Hold Zegerid and check H Pylori breath test in 2 weeks. No offending  foods  Cough Improved but persistent, likely multifactorial, avoid offending foods and reassess

## 2013-01-06 NOTE — Assessment & Plan Note (Signed)
Diffuse, symmetric myalgias. Will repeat CPK and consider increasing dose of Effexor in past. May use NSAIDS prn

## 2013-01-06 NOTE — Assessment & Plan Note (Signed)
Following with Dr Hazle Quant

## 2013-01-06 NOTE — Patient Instructions (Addendum)
Jobst, light weight 10-20 mmhg knee highs, on in am off in pm, call for rx if your insurance covers Elevate feet above heart for 15 minutes when swollen.  Stop Vitamin E start MegaRed or a generic krill oil cap daily  2 tums 3 x a day  To foot pumice stone then Lamisil or Lotrimin  Basic Carbohydrate Counting Basic carbohydrate counting is a way to plan meals. It is done by counting the amount of carbohydrate in foods. Foods that have carbohydrates are starches (grains, beans, starchy vegetables) and sweets. Eating carbohydrates increases blood glucose (sugar) levels. People with diabetes use carbohydrate counting to help keep their blood glucose at a normal level.  COUNTING CARBOHYDRATES IN FOODS The first step in counting carbohydrates is to learn how many carbohydrate servings you should have in every meal. A dietitian can plan this for you. After learning the amount of carbohydrates to include in your meal plan, you can start to choose the carbohydrate-containing foods you want to eat.  There are 2 ways to identify the amount of carbohydrates in the foods you eat.  Read the Nutrition Facts panel on food labels. You need 2 pieces of information from the Nutrition Facts panel to count carbohydrates this way:  Serving size.  Total carbohydrate (in grams). Decide how many servings you will be eating. If it is 1 serving, you will be eating the amount of carbohydrate listed on the panel. If you will be eating 2 servings, you will be eating double the amount of carbohydrate listed on the panel.   Learn serving sizes. A serving size of most carbohydrate-containing foods is about 15 grams (g). Listed below are single serving sizes of common carbohydrate-containing foods:  1 slice bread.   cup unsweetened, dry cereal.   cup hot cereal.   cup rice.   cup mashed potatoes.   cup pasta.  1 cup fresh fruit.   cup canned fruit.  1 cup milk (whole, 2%, or skim).   cup starchy  vegetables (peas, corn, or potatoes). Counting carbohydrates this way is similar to looking on the Nutrition Facts panel. Decide how many servings you will eat first. Multiply the number of servings you eat by 15 g. For example, if you have 2 cups of strawberries, you had 2 servings. That means you had 30 g of carbohydrate (2 servings x 15 g = 30 g). CALCULATING CARBOHYDRATES IN A MEAL Sample dinner  3 oz chicken breast.   cup brown rice.   cup corn.  1 cup fat-free milk.  1 cup strawberries with sugar-free whipped topping. Carbohydrate calculation First, identify the foods that contain carbohydrate:  Rice.  Corn.  Milk.  Strawberries. Calculate the number of servings eaten:  2 servings rice.  1 serving corn.  1 serving milk.  1 serving strawberries. Multiply the number of servings by 15 g:  2 servings rice x 15 g = 30 g.  1 serving corn x 15 g = 15 g.  1 serving milk x 15 g = 15 g.  1 serving strawberries x 15 g = 15 g. Add the amounts to find the total carbohydrates eaten: 30 g + 15 g + 15 g + 15 g = 75 g carbohydrate eaten at dinner. Document Released: 04/10/2005 Document Revised: 07/03/2011 Document Reviewed: 02/24/2011 [facility] Patient Information 2014 [place], [place].

## 2013-01-06 NOTE — Assessment & Plan Note (Signed)
Request records from Dr Ronney Asters to see when last upper endoscopy was. Hold Zegerid and check H Pylori breath test in 2 weeks. No offending foods

## 2013-01-07 ENCOUNTER — Encounter: Payer: Self-pay | Admitting: Family Medicine

## 2013-01-07 LAB — URINALYSIS
Bilirubin Urine: NEGATIVE
Glucose, UA: NEGATIVE mg/dL
Hgb urine dipstick: NEGATIVE
Ketones, ur: NEGATIVE mg/dL
Leukocytes, UA: NEGATIVE
Nitrite: NEGATIVE
Protein, ur: NEGATIVE mg/dL
Specific Gravity, Urine: 1.02 (ref 1.005–1.030)
Urobilinogen, UA: 0.2 mg/dL (ref 0.0–1.0)
pH: 5.5 (ref 5.0–8.0)

## 2013-01-07 LAB — URINE CULTURE

## 2013-01-07 LAB — MICROALBUMIN / CREATININE URINE RATIO
Creatinine, Urine: 151 mg/dL
Microalb Creat Ratio: 14.3 mg/g (ref 0.0–30.0)
Microalb, Ur: 2.16 mg/dL — ABNORMAL HIGH (ref 0.00–1.89)

## 2013-01-07 MED ORDER — TRUEPLUS LANCETS 30G MISC: 1.0000 | Freq: Every day | Status: DC | PRN

## 2013-01-07 MED ORDER — GLUCOSE BLOOD VI STRP: ORAL_STRIP | Status: DC

## 2013-01-07 MED ORDER — HYDROCODONE-HOMATROPINE 5-1.5 MG/5ML PO SYRP: 5.0000 mL | ORAL_SOLUTION | Freq: Three times a day (TID) | ORAL | Status: DC | PRN

## 2013-01-07 NOTE — Telephone Encounter (Signed)
Please advise 

## 2013-01-07 NOTE — Telephone Encounter (Signed)
RX faxed

## 2013-01-07 NOTE — Telephone Encounter (Signed)
Patient states her meter is True.  RX's sent to pharmacy

## 2013-01-08 ENCOUNTER — Encounter: Payer: Self-pay | Admitting: Family Medicine

## 2013-01-08 NOTE — Telephone Encounter (Signed)
Please advise? I don't see where you wanted patient to go to get a repeat?  If so please put the order in or send to [place] or [facility] [place] to

## 2013-01-09 NOTE — Telephone Encounter (Signed)
Looking to see if this is done

## 2013-01-14 ENCOUNTER — Telehealth: Payer: Self-pay

## 2013-01-14 ENCOUNTER — Other Ambulatory Visit (INDEPENDENT_AMBULATORY_CARE_PROVIDER_SITE_OTHER): Payer: 59

## 2013-01-14 DIAGNOSIS — E039 Hypothyroidism, unspecified: Secondary | ICD-10-CM

## 2013-01-14 DIAGNOSIS — N390 Urinary tract infection, site not specified: Secondary | ICD-10-CM

## 2013-01-14 DIAGNOSIS — I10 Essential (primary) hypertension: Secondary | ICD-10-CM

## 2013-01-14 DIAGNOSIS — R7309 Other abnormal glucose: Secondary | ICD-10-CM

## 2013-01-14 LAB — URINALYSIS, ROUTINE W REFLEX MICROSCOPIC
Bilirubin Urine: NEGATIVE
Hgb urine dipstick: NEGATIVE
Ketones, ur: NEGATIVE
Leukocytes, UA: NEGATIVE
Nitrite: NEGATIVE
RBC / HPF: NONE SEEN (ref 0–?)
Specific Gravity, Urine: >=1.03 (ref 1.000–1.030)
Total Protein, Urine: NEGATIVE
Urine Glucose: NEGATIVE
Urobilinogen, UA: 0.2 (ref 0.0–1.0)
pH: 6 (ref 5.0–8.0)

## 2013-01-14 LAB — CBC
HCT: 42 % (ref 36.0–46.0)
Hemoglobin: 14.4 g/dL (ref 12.0–15.0)
MCHC: 34.2 g/dL (ref 30.0–36.0)
MCV: 86.1 fl (ref 78.0–100.0)
Platelets: 348 10*3/uL (ref 150.0–400.0)
RBC: 4.88 Mil/uL (ref 3.87–5.11)
RDW: 14.1 % (ref 11.5–14.6)
WBC: 6.3 10*3/uL (ref 4.5–10.5)

## 2013-01-14 LAB — T4, FREE: Free T4: 0.63 ng/dL (ref 0.60–1.60)

## 2013-01-14 LAB — TSH: TSH: 0.11 u[IU]/mL — ABNORMAL LOW (ref 0.35–5.50)

## 2013-01-14 NOTE — Telephone Encounter (Signed)
Pt is at [facility] lab for a urine culture

## 2013-01-15 NOTE — Progress Notes (Signed)
Quick Note:  Patient Informed and voiced understanding ______ 

## 2013-01-16 LAB — CULTURE, URINE COMPREHENSIVE
Colony Count: NO GROWTH
Organism ID, Bacteria: NO GROWTH

## 2013-01-20 ENCOUNTER — Telehealth: Payer: Self-pay

## 2013-01-20 DIAGNOSIS — M791 Myalgia, unspecified site: Secondary | ICD-10-CM

## 2013-01-20 DIAGNOSIS — A048 Other specified bacterial intestinal infections: Secondary | ICD-10-CM

## 2013-01-20 LAB — CK TOTAL AND CKMB (NOT AT ARMC)
CK, MB: 3.8 ng/mL (ref 0.3–4.0)
Relative Index: 1.7 (ref 0.0–2.5)
Total CK: 229 U/L — ABNORMAL HIGH (ref 7–177)

## 2013-01-20 NOTE — Telephone Encounter (Signed)
Lab order placed.

## 2013-01-21 ENCOUNTER — Encounter: Payer: Self-pay | Admitting: Family Medicine

## 2013-01-21 LAB — H. PYLORI BREATH TEST: H. pylori Breath Test: NEGATIVE

## 2013-01-22 NOTE — Telephone Encounter (Signed)
Patient is coming in tomorrow at 82

## 2013-01-23 ENCOUNTER — Ambulatory Visit (INDEPENDENT_AMBULATORY_CARE_PROVIDER_SITE_OTHER): Payer: 59 | Admitting: Family Medicine

## 2013-01-23 ENCOUNTER — Encounter: Payer: Self-pay | Admitting: Family Medicine

## 2013-01-23 ENCOUNTER — Ambulatory Visit (HOSPITAL_BASED_OUTPATIENT_CLINIC_OR_DEPARTMENT_OTHER)
Admission: RE | Admit: 2013-01-23 | Discharge: 2013-01-23 | Disposition: A | Discharge status: Home / Self Care | Payer: 59 | Source: Ambulatory Visit | Attending: Family Medicine | Admitting: Family Medicine

## 2013-01-23 VITALS — BP 128/90 | HR 98 | Temp 98.4°F | Ht 64.0 in | Wt 239.0 lb

## 2013-01-23 DIAGNOSIS — E119 Type 2 diabetes mellitus without complications: Secondary | ICD-10-CM

## 2013-01-23 DIAGNOSIS — R490 Dysphonia: Secondary | ICD-10-CM

## 2013-01-23 DIAGNOSIS — K219 Gastro-esophageal reflux disease without esophagitis: Secondary | ICD-10-CM

## 2013-01-23 DIAGNOSIS — J309 Allergic rhinitis, unspecified: Secondary | ICD-10-CM

## 2013-01-23 DIAGNOSIS — E039 Hypothyroidism, unspecified: Secondary | ICD-10-CM | POA: Insufficient documentation

## 2013-01-23 DIAGNOSIS — I10 Essential (primary) hypertension: Secondary | ICD-10-CM

## 2013-01-23 DIAGNOSIS — T7840XD Allergy, unspecified, subsequent encounter: Secondary | ICD-10-CM

## 2013-01-23 DIAGNOSIS — R109 Unspecified abdominal pain: Secondary | ICD-10-CM

## 2013-01-23 DIAGNOSIS — Z5189 Encounter for other specified aftercare: Secondary | ICD-10-CM

## 2013-01-23 MED ORDER — NEBIVOLOL HCL 5 MG PO TABS: 5.0000 mg | ORAL_TABLET | Freq: Every day | ORAL | Status: DC

## 2013-01-23 NOTE — Patient Instructions (Signed)
No Afrin, No Sudafed/D  Hydrate Protein every 4 hours

## 2013-01-24 LAB — URINALYSIS
Bilirubin Urine: NEGATIVE
Glucose, UA: NEGATIVE mg/dL
Hgb urine dipstick: NEGATIVE
Ketones, ur: NEGATIVE mg/dL
Leukocytes, UA: NEGATIVE
Nitrite: NEGATIVE
Protein, ur: NEGATIVE mg/dL
Specific Gravity, Urine: 1.017 (ref 1.005–1.030)
Urobilinogen, UA: 0.2 mg/dL (ref 0.0–1.0)
pH: 5.5 (ref 5.0–8.0)

## 2013-01-24 NOTE — Progress Notes (Signed)
Quick Note:  Patient Informed and voiced understanding.  Results released ______ 

## 2013-01-25 LAB — URINE CULTURE: Colony Count: 50000

## 2013-01-26 ENCOUNTER — Encounter: Payer: Self-pay | Admitting: Family Medicine

## 2013-01-26 NOTE — Assessment & Plan Note (Signed)
Stop Afrin and sudafed. Use nasal saline and antihistamines. Agrees to follow up with ENT if symptoms worsen

## 2013-01-26 NOTE — Progress Notes (Signed)
Patient ID: [name], female   DOB: Dec 10, 1960, 52 y.o.   MRN: [id number] [name] [id number] 11/21/1960 01/26/2013      Progress Note-Follow Up  Subjective  Chief Complaint  Chief Complaint  Patient presents with  . Dizziness    X 5 days  . voice gone    X 3 weeks    HPI  Patient is a 52 year old American female who is in today complaining of 5 days worth of a lightheaded sensation. She denies true spinning but does have an off balance feeling. Notes her blood sugars are generally been okay 90-110. No polyuria or polydipsia. Acknowledges fatigue and malaise but no fevers. Has ongoing nasal congestion and irritation. Difficulty breathing. Some mild sore throat and hoarseness is also noted. No chest pain or palpitations. No shortness of breath GI or GU complaints. No dysphagia.  Past Medical History  Diagnosis Date  . GERD (gastroesophageal reflux disease)   . Hypertension   . Thyroid disease     hypothyroidism  . Anxiety   . Depression   . Hyperlipidemia   . Chronic rhinosinusitis   . OSA (obstructive sleep apnea)   . Obesity   . Colon polyps   . Fibroid, uterine   . Hoarseness   . Glaucoma 12-13  . IBS (irritable bowel syndrome) 02/13/2011  . Perimenopause 10/05/2012  . Edema 01/06/2013  . Diabetes 01/06/2013  . Chronic pain syndrome 01/06/2013  . Cough 01/06/2013    Past Surgical History  Procedure Laterality Date  . Cholecystectomy    . Abdominal hysterectomy      Family History  Problem Relation Age of Onset  . Alcohol abuse Father   . Cancer Father     renal and colon  . Hyperlipidemia Father   . Hypertension Father   . Diabetes    . Cancer Paternal Grandfather     colon    History   Social History  . Marital Status: Single    Spouse Name: N/A    Number of Children: N/A  . Years of Education: N/A   Occupational History  . Not on file.   Social History Main Topics  . Smoking status: Former Smoker -- 0.50 packs/day for 30 years   Types: Cigarettes    Quit date: 04/25/2003  . Smokeless tobacco: Never Used     Comment: smoked since age 42  . Alcohol Use: No  . Drug Use: Not on file  . Sexual Activity: Not on file   Other Topics Concern  . Not on file   Social History Narrative   Endo-- Dr Lucianne Muss   ENT--Dr shoemaker   GI--Dr Buccini   Pulm--Dr Clance   Rheum--Dr Kellie Simmering          Current Outpatient Prescriptions on File Prior to Visit  Medication Sig Dispense Refill  . ALPRAZolam (XANAX) 0.25 MG tablet Take 1 tablet (0.25 mg total) by mouth 2 (two) times daily as needed for sleep.  20 tablet  0  . b complex vitamins tablet Take 1 tablet by mouth daily.      . Beta Carotene (VITAMIN A) 25000 UNIT capsule Take 25,000 Units by mouth daily.      . cholecalciferol (VITAMIN D-400) 400 UNITS TABS Take 400 Units by mouth daily.      . clotrimazole (LOTRIMIN) 1 % cream Apply topically 2 (two) times daily.  60 g  1  . diphenhydrAMINE (BENADRYL) 25 MG tablet Take 25 mg by mouth at bedtime as needed.        Marland Kitchen  estradiol (ESTRACE) 1 MG tablet Take 1 mg by mouth daily.        . furosemide (LASIX) 20 MG tablet Take 1 tablet (20 mg total) by mouth daily as needed for fluid or edema.  30 tablet  3  . glucose blood (TRUE CARE TEST STRIP PACK) test strip Use as instructed  100 each  3  . HYDROcodone-homatropine (HYCODAN) 5-1.5 MG/5ML syrup Take 5 mLs by mouth 3 (three) times daily as needed for cough.  140 mL  0  . KLOR-CON M20 20 MEQ tablet TAKE 1 TABLET BY MOUTH DAILY  90 tablet  PRN  . latanoprost (XALATAN) 0.005 % ophthalmic solution Place 1 drop into both eyes at bedtime.      Marland Kitchen losartan-hydrochlorothiazide (HYZAAR) 50-12.5 MG per tablet TAKE 1 TABLET BY MOUTH DAILY  90 tablet  1  . metFORMIN (GLUCOPHAGE-XR) 500 MG 24 hr tablet Take 500 mg by mouth daily with breakfast.      . omeprazole-sodium bicarbonate (ZEGERID) 40-1100 MG per capsule Take 1 capsule by mouth daily.  30 capsule  4  . ranitidine (ZANTAC) 300 MG tablet  Take 1 tablet (300 mg total) by mouth at bedtime.  30 tablet  2  . thyroid (ARMOUR THYROID) 120 MG tablet Take 1 tablet (120 mg total) by mouth daily before breakfast.  30 tablet  3  . TRUEPLUS LANCETS 30G MISC 1 Device by Does not apply route daily as needed. DX 250.00 by Does not apply route daily as needed. DX 250.00  100 each  2  . venlafaxine XR (EFFEXOR XR) 150 MG 24 hr capsule Take 1 capsule (150 mg total) by mouth daily.  30 capsule  3   No current facility-administered medications on file prior to visit.    Allergies  Allergen Reactions  . Aspirin     REACTION: Upset stomach  . Amoxicillin Rash    Review of Systems  Review of Systems  Constitutional: Positive for malaise/fatigue. Negative for fever.  HENT: Positive for congestion and sore throat.   Eyes: Negative for discharge.  Respiratory: Positive for cough. Negative for shortness of breath.   Cardiovascular: Negative for chest pain, palpitations and leg swelling.  Gastrointestinal: Negative for nausea, abdominal pain and diarrhea.  Genitourinary: Negative for dysuria.  Musculoskeletal: Negative for falls.  Skin: Negative for rash.  Neurological: Positive for weakness. Negative for loss of consciousness and headaches.  Endo/Heme/Allergies: Negative for polydipsia.  Psychiatric/Behavioral: Negative for depression and suicidal ideas. The patient is not nervous/anxious and does not have insomnia.     Objective  BP 128/90  Pulse 98  Temp(Src) 98.4 F (36.9 C) (Oral)  Ht 5\' 4"  (1.626 m)  Wt 239 lb (108.41 kg)  BMI 41 kg/m2  SpO2 97%  Physical Exam  Physical Exam  Constitutional: She is oriented to person, place, and time and well-developed, well-nourished, and in no distress. No distress.  HENT:  Head: Normocephalic and atraumatic.  Nasal mucosa boggy and erythematous  Eyes: Conjunctivae are normal.  Neck: Neck supple. No thyromegaly present.  Cardiovascular: Normal rate, regular rhythm and normal heart  sounds.   No murmur heard. Pulmonary/Chest: Effort normal and breath sounds normal. She has no wheezes.  Abdominal: She exhibits no distension and no mass.  Musculoskeletal: She exhibits no edema.  Lymphadenopathy:    She has no cervical adenopathy.  Neurological: She is alert and oriented to person, place, and time. Gait normal.  Skin: Skin is warm and dry. No rash noted. She is  not diaphoretic.  Psychiatric: Memory, affect and judgment normal.    Lab Results  Component Value Date   TSH 0.11* 01/14/2013   Lab Results  Component Value Date   WBC 6.3 01/14/2013   HGB 14.4 01/14/2013   HCT 42.0 01/14/2013   MCV 86.1 01/14/2013   PLT 348.0 01/14/2013   Lab Results  Component Value Date   CREATININE 0.9 12/18/2012   BUN 10 12/18/2012   NA 137 12/18/2012   K 3.9 12/18/2012   CL 103 12/18/2012   CO2 26 12/18/2012   Lab Results  Component Value Date   ALT 23 12/18/2012   AST 26 12/18/2012   ALKPHOS 53 12/18/2012   BILITOT 0.3 12/18/2012   Lab Results  Component Value Date   CHOL 190 12/18/2012   Lab Results  Component Value Date   HDL 46.30 12/18/2012   Lab Results  Component Value Date   LDLCALC 104* 03/25/2012   Lab Results  Component Value Date   TRIG 241.0* 12/18/2012   Lab Results  Component Value Date   CHOLHDL 4 12/18/2012     Assessment & Plan  HYPERTENSION Well controlled no changes  ALLERGIC RHINITIS Stop Afrin and sudafed. Use nasal saline and antihistamines. Agrees to follow up with ENT if symptoms worsen  HYPOTHYROIDISM Thyroid Ultrasound wnl. TSH low but other thyroid studies normal. No further changes.   Diabetes Well controlled, no changes, no flares the last few days  GERD Symptoms improved but noting some hoarseness. Continue current meds and report worsening

## 2013-01-26 NOTE — Assessment & Plan Note (Addendum)
Thyroid Ultrasound wnl. TSH low but other thyroid studies normal. No further changes.

## 2013-01-26 NOTE — Assessment & Plan Note (Signed)
Well controlled, no changes, no flares the last few days

## 2013-01-26 NOTE — Assessment & Plan Note (Signed)
Well controlled no changes 

## 2013-01-26 NOTE — Assessment & Plan Note (Signed)
Symptoms improved but noting some hoarseness. Continue current meds and report worsening

## 2013-01-28 ENCOUNTER — Other Ambulatory Visit: Payer: Self-pay | Admitting: Internal Medicine

## 2013-01-28 ENCOUNTER — Other Ambulatory Visit: Payer: Self-pay | Admitting: Family Medicine

## 2013-01-28 MED ORDER — NITROFURANTOIN MONOHYD MACRO 100 MG PO CAPS: 100.0000 mg | ORAL_CAPSULE | Freq: Two times a day (BID) | ORAL | Status: DC

## 2013-01-28 NOTE — Telephone Encounter (Signed)
So if she has not been taking this she does not need it because her numbers have been normal. If she is having symptoms that make her think it is off she should let us repeat a renal panel to check level before we start it

## 2013-01-28 NOTE — Telephone Encounter (Signed)
Notified pt and she voices understanding. Rx sent. Also received refill for potassium. Per [facility], last refill was 09/2011.  Please advise if ok to send additional refills?

## 2013-01-28 NOTE — Telephone Encounter (Signed)
Message copied by Kathi Simpers on Tue Jan 28, 2013  3:01 PM ------      Message from: Danise Edge A      Created: Sun Jan 26, 2013  9:28 AM       So her culture came back mixed flora but due to her symptoms would treat with 3 days of Macrobid 100 mg 1 tab po bid x 3 days ------

## 2013-01-29 NOTE — Telephone Encounter (Signed)
Left detailed message on home # to call with response to below questions. 

## 2013-02-03 ENCOUNTER — Encounter: Payer: Self-pay | Admitting: Family Medicine

## 2013-02-03 ENCOUNTER — Ambulatory Visit (INDEPENDENT_AMBULATORY_CARE_PROVIDER_SITE_OTHER): Payer: 59 | Admitting: Family Medicine

## 2013-02-03 VITALS — BP 128/82 | HR 87 | Temp 98.9°F | Ht 64.0 in | Wt 242.0 lb

## 2013-02-03 DIAGNOSIS — I10 Essential (primary) hypertension: Secondary | ICD-10-CM

## 2013-02-03 DIAGNOSIS — J309 Allergic rhinitis, unspecified: Secondary | ICD-10-CM

## 2013-02-03 DIAGNOSIS — E876 Hypokalemia: Secondary | ICD-10-CM

## 2013-02-03 DIAGNOSIS — K219 Gastro-esophageal reflux disease without esophagitis: Secondary | ICD-10-CM

## 2013-02-03 DIAGNOSIS — E119 Type 2 diabetes mellitus without complications: Secondary | ICD-10-CM

## 2013-02-03 DIAGNOSIS — F4323 Adjustment disorder with mixed anxiety and depressed mood: Secondary | ICD-10-CM

## 2013-02-03 DIAGNOSIS — E039 Hypothyroidism, unspecified: Secondary | ICD-10-CM

## 2013-02-03 MED ORDER — POTASSIUM CHLORIDE CRYS ER 20 MEQ PO TBCR: 20.0000 meq | EXTENDED_RELEASE_TABLET | Freq: Every day | ORAL | Status: DC

## 2013-02-03 MED ORDER — NEBIVOLOL HCL 5 MG PO TABS: 5.0000 mg | ORAL_TABLET | Freq: Every day | ORAL | Status: DC

## 2013-02-03 MED ORDER — RANITIDINE HCL 150 MG PO TABS: 150.0000 mg | ORAL_TABLET | Freq: Every day | ORAL | Status: DC

## 2013-02-03 NOTE — Progress Notes (Signed)
Patient ID: [name], female   DOB: [birth year], 52 y.o.   MRN: [id number] [name] [id number] [birth year] 02/03/2013      Progress Note-Follow Up  Subjective  Chief Complaint  Chief Complaint  Patient presents with  . Follow-up    4 week follow up    HPI  Is a 52 year old female in today for followup. Reports feeling better with Effexor and the addition of a thyroid support herbal medication. She's not sure what she says since taking it her energy is better. She finds herself less anxious in public. No other acute complaints. No chest pain, palpitations, polyuria, polydipsia, shortness of breath, GI or GU concerns noted. Taking medications as prescribed the  Past Medical History  Diagnosis Date  . GERD (gastroesophageal reflux disease)   . Hypertension   . Thyroid disease     hypothyroidism  . Anxiety   . Depression   . Hyperlipidemia   . Chronic rhinosinusitis   . OSA (obstructive sleep apnea)   . Obesity   . Colon polyps   . Fibroid, uterine   . Hoarseness   . Glaucoma 12-13  . IBS (irritable bowel syndrome) 02/13/2011  . Perimenopause 10/05/2012  . Edema 01/06/2013  . Diabetes 01/06/2013  . Chronic pain syndrome 01/06/2013  . Cough 01/06/2013    Past Surgical History  Procedure Laterality Date  . Cholecystectomy    . Abdominal hysterectomy      Family History  Problem Relation Age of Onset  . Alcohol abuse Father   . Cancer Father     renal and colon  . Hyperlipidemia Father   . Hypertension Father   . Diabetes    . Cancer Paternal Grandfather     colon    History   Social History  . Marital Status: Single    Spouse Name: N/A    Number of Children: N/A  . Years of Education: N/A   Occupational History  . Not on file.   Social History Main Topics  . Smoking status: Former Smoker -- 0.50 packs/day for 30 years    Types: Cigarettes    Quit date: 04/25/2003  . Smokeless tobacco: Never Used     Comment: smoked since age 36  . Alcohol  Use: No  . Drug Use: Not on file  . Sexual Activity: Not on file   Other Topics Concern  . Not on file   Social History Narrative   Endo-- Dr Lucianne Muss   ENT--Dr shoemaker   GI--Dr Buccini   Pulm--Dr Clance   Rheum--Dr Kellie Simmering          Current Outpatient Prescriptions on File Prior to Visit  Medication Sig Dispense Refill  . ALPRAZolam (XANAX) 0.25 MG tablet Take 1 tablet (0.25 mg total) by mouth 2 (two) times daily as needed for sleep.  20 tablet  0  . b complex vitamins tablet Take 1 tablet by mouth daily.      . Beta Carotene (VITAMIN A) 25000 UNIT capsule Take 25,000 Units by mouth daily.      . cholecalciferol (VITAMIN D-400) 400 UNITS TABS Take 400 Units by mouth daily.      . clotrimazole (LOTRIMIN) 1 % cream Apply topically 2 (two) times daily.  60 g  1  . diphenhydrAMINE (BENADRYL) 25 MG tablet Take 25 mg by mouth at bedtime as needed.        Marland Kitchen estradiol (ESTRACE) 1 MG tablet Take 1 mg by mouth daily.        Marland Kitchen  furosemide (LASIX) 20 MG tablet Take 1 tablet (20 mg total) by mouth daily as needed for fluid or edema.  30 tablet  3  . glucose blood (TRUE CARE TEST STRIP PACK) test strip Use as instructed  100 each  3  . HYDROcodone-homatropine (HYCODAN) 5-1.5 MG/5ML syrup Take 5 mLs by mouth 3 (three) times daily as needed for cough.  140 mL  0  . latanoprost (XALATAN) 0.005 % ophthalmic solution Place 1 drop into both eyes at bedtime.      Marland Kitchen losartan-hydrochlorothiazide (HYZAAR) 50-12.5 MG per tablet TAKE 1 TABLET BY MOUTH DAILY  90 tablet  1  . metFORMIN (GLUCOPHAGE-XR) 500 MG 24 hr tablet Take 500 mg by mouth daily with breakfast.      . nitrofurantoin, macrocrystal-monohydrate, (MACROBID) 100 MG capsule Take 1 capsule (100 mg total) by mouth 2 (two) times daily.  6 capsule  0  . omeprazole-sodium bicarbonate (ZEGERID) 40-1100 MG per capsule Take 1 capsule by mouth daily.  30 capsule  4  . thyroid (ARMOUR THYROID) 120 MG tablet Take 1 tablet (120 mg total) by mouth daily before  breakfast.  30 tablet  3  . TRUEPLUS LANCETS 30G MISC 1 Device by Does not apply route daily as needed. DX 250.00 by Does not apply route daily as needed. DX 250.00  100 each  2  . venlafaxine XR (EFFEXOR-XR) 150 MG 24 hr capsule TAKE 1 CAPSULE BY MOUTH DAILY.  30 capsule  3   No current facility-administered medications on file prior to visit.    Allergies  Allergen Reactions  . Aspirin     REACTION: Upset stomach  . Amoxicillin Rash    Review of Systems  Review of Systems  Constitutional: Negative for fever and malaise/fatigue.  HENT: Positive for congestion.   Eyes: Negative for discharge.  Respiratory: Positive for cough. Negative for shortness of breath.   Cardiovascular: Negative for chest pain, palpitations and leg swelling.  Gastrointestinal: Negative for nausea, abdominal pain and diarrhea.  Genitourinary: Negative for dysuria.  Musculoskeletal: Negative for falls.  Skin: Negative for rash.  Neurological: Negative for loss of consciousness and headaches.  Endo/Heme/Allergies: Negative for polydipsia.  Psychiatric/Behavioral: Positive for depression. Negative for suicidal ideas. The patient is nervous/anxious. The patient does not have insomnia.     Objective  BP 128/82  Pulse 87  Temp(Src) 98.9 F (37.2 C) (Oral)  Ht 5\' 4"  (1.626 m)  Wt 242 lb (109.77 kg)  BMI 41.52 kg/m2  SpO2 97%  Physical Exam  Physical Exam  Constitutional: She is oriented to person, place, and time and well-developed, well-nourished, and in no distress. No distress.  HENT:  Head: Normocephalic and atraumatic.  Eyes: Conjunctivae are normal.  Neck: Neck supple. No thyromegaly present.  Cardiovascular: Normal rate, regular rhythm and normal heart sounds.   No murmur heard. Pulmonary/Chest: Effort normal and breath sounds normal. She has no wheezes.  Abdominal: She exhibits no distension and no mass.  Musculoskeletal: She exhibits no edema.  Lymphadenopathy:    She has no cervical  adenopathy.  Neurological: She is alert and oriented to person, place, and time.  Skin: Skin is warm and dry. No rash noted. She is not diaphoretic.  Psychiatric: Memory, affect and judgment normal.    Lab Results  Component Value Date   TSH 0.11* 01/14/2013   Lab Results  Component Value Date   WBC 6.3 01/14/2013   HGB 14.4 01/14/2013   HCT 42.0 01/14/2013   MCV 86.1 01/14/2013  PLT 348.0 01/14/2013   Lab Results  Component Value Date   CREATININE 0.9 12/18/2012   BUN 10 12/18/2012   NA 137 12/18/2012   K 3.9 12/18/2012   CL 103 12/18/2012   CO2 26 12/18/2012   Lab Results  Component Value Date   ALT 23 12/18/2012   AST 26 12/18/2012   ALKPHOS 53 12/18/2012   BILITOT 0.3 12/18/2012   Lab Results  Component Value Date   CHOL 190 12/18/2012   Lab Results  Component Value Date   HDL 46.30 12/18/2012   Lab Results  Component Value Date   LDLCALC 104* 03/25/2012   Lab Results  Component Value Date   TRIG 241.0* 12/18/2012   Lab Results  Component Value Date   CHOLHDL 4 12/18/2012     Assessment & Plan  ADJ DISORDER WITH MIXED ANXIETY & DEPRESSED MOOD Doing much better on Venlafaxine, needing Alprazolam prn.   HYPERTENSION Well controlled on current meds.  Diabetes Well controlled, no changes. hgba1c 5.9  Avoid simple carbs.  HYPOTHYROIDISM TSH suppressed but T4 and T3 normal. Will monitor  Hypokalemia Well treated on KCL  ALLERGIC RHINITIS Loratadine and Flonase

## 2013-02-03 NOTE — Patient Instructions (Addendum)
Call and let us know what day you want to go to [facility] for labs so we can place orders 2 Tums at bed and a Ranitidine 150 mg at bed   Basic Carbohydrate Counting Basic carbohydrate counting is a way to plan meals. It is done by counting the amount of carbohydrate in foods. Foods that have carbohydrates are starches (grains, beans, starchy vegetables) and sweets. Eating carbohydrates increases blood glucose (sugar) levels. People with diabetes use carbohydrate counting to help keep their blood glucose at a normal level.  COUNTING CARBOHYDRATES IN FOODS The first step in counting carbohydrates is to learn how many carbohydrate servings you should have in every meal. A dietitian can plan this for you. After learning the amount of carbohydrates to include in your meal plan, you can start to choose the carbohydrate-containing foods you want to eat.  There are 2 ways to identify the amount of carbohydrates in the foods you eat.  Read the Nutrition Facts panel on food labels. You need 2 pieces of information from the Nutrition Facts panel to count carbohydrates this way:  Serving size.  Total carbohydrate (in grams). Decide how many servings you will be eating. If it is 1 serving, you will be eating the amount of carbohydrate listed on the panel. If you will be eating 2 servings, you will be eating double the amount of carbohydrate listed on the panel.   Learn serving sizes. A serving size of most carbohydrate-containing foods is about 15 grams (g). Listed below are single serving sizes of common carbohydrate-containing foods:  1 slice bread.   cup unsweetened, dry cereal.   cup hot cereal.   cup rice.   cup mashed potatoes.   cup pasta.  1 cup fresh fruit.   cup canned fruit.  1 cup milk (whole, 2%, or skim).   cup starchy vegetables (peas, corn, or potatoes). Counting carbohydrates this way is similar to looking on the Nutrition Facts panel. Decide how many servings you will eat  first. Multiply the number of servings you eat by 15 g. For example, if you have 2 cups of strawberries, you had 2 servings. That means you had 30 g of carbohydrate (2 servings x 15 g = 30 g). CALCULATING CARBOHYDRATES IN A MEAL Sample dinner  3 oz chicken breast.   cup brown rice.   cup corn.  1 cup fat-free milk.  1 cup strawberries with sugar-free whipped topping. Carbohydrate calculation First, identify the foods that contain carbohydrate:  Rice.  Corn.  Milk.  Strawberries. Calculate the number of servings eaten:  2 servings rice.  1 serving corn.  1 serving milk.  1 serving strawberries. Multiply the number of servings by 15 g:  2 servings rice x 15 g = 30 g.  1 serving corn x 15 g = 15 g.  1 serving milk x 15 g = 15 g.  1 serving strawberries x 15 g = 15 g. Add the amounts to find the total carbohydrates eaten: 30 g + 15 g + 15 g + 15 g = 75 g carbohydrate eaten at dinner. Document Released: 04/10/2005 Document Revised: 07/03/2011 Document Reviewed: 02/24/2011 [facility] Patient Information 2014 [place], [place].

## 2013-02-04 LAB — HM DIABETES FOOT EXAM

## 2013-02-05 ENCOUNTER — Encounter: Payer: Self-pay | Admitting: Family Medicine

## 2013-02-05 DIAGNOSIS — E876 Hypokalemia: Secondary | ICD-10-CM

## 2013-02-05 HISTORY — DX: Hypokalemia: E87.6

## 2013-02-05 NOTE — Assessment & Plan Note (Signed)
Loratadine and Flonase ?

## 2013-02-05 NOTE — Assessment & Plan Note (Addendum)
Doing much better on Venlafaxine, needing Alprazolam prn.

## 2013-02-05 NOTE — Assessment & Plan Note (Signed)
Well treated on KCL

## 2013-02-05 NOTE — Assessment & Plan Note (Signed)
Well controlled, no changes. hgba1c 5.9  Avoid simple carbs.

## 2013-02-05 NOTE — Assessment & Plan Note (Signed)
TSH suppressed but T4 and T3 normal. Will monitor

## 2013-02-05 NOTE — Assessment & Plan Note (Signed)
Well-controlled on current meds 

## 2013-02-06 ENCOUNTER — Encounter: Payer: Self-pay | Admitting: Family Medicine

## 2013-02-06 ENCOUNTER — Telehealth: Payer: Self-pay

## 2013-02-06 NOTE — Telephone Encounter (Signed)
Opened in error

## 2013-02-17 ENCOUNTER — Ambulatory Visit: Payer: Self-pay | Admitting: Dietician

## 2013-02-18 ENCOUNTER — Encounter: Payer: 59 | Attending: Family Medicine | Admitting: *Deleted

## 2013-02-18 ENCOUNTER — Encounter: Payer: Self-pay | Admitting: *Deleted

## 2013-02-18 VITALS — Ht 64.5 in | Wt 238.0 lb

## 2013-02-18 DIAGNOSIS — Z713 Dietary counseling and surveillance: Secondary | ICD-10-CM | POA: Insufficient documentation

## 2013-02-18 DIAGNOSIS — E669 Obesity, unspecified: Secondary | ICD-10-CM

## 2013-02-18 DIAGNOSIS — E119 Type 2 diabetes mellitus without complications: Secondary | ICD-10-CM | POA: Insufficient documentation

## 2013-02-18 NOTE — Progress Notes (Signed)
Appt start time: 1100 end time:  1200.  Assessment:  Patient was seen on  02/18/13 for individual diabetes education. Curry was referred by the [facility] program.  She was referred for group education classes, but was scheduled for an individual appointment.  I have recommended that we discuss some information today and then schedule her for the remaining 2 classes.   She has prediabetes, not diabetes. She does the food shopping and cooking.  She might eat out 3-4 times a week.  She does skip meals and consumed refined carbohydrates and sugary beverages.    Current HbA1c: 5.9%  Preferred Learning Style:  Auditory  Visual  Learning Readiness:   Ready  MEDICATIONS: see list  DIETARY INTAKE:  Usual eating pattern includes 2-3 meals and 0-2 snacks per day.  Everyday foods include refined carbohydrates and fatty proteins.  Avoided foods include none.    24-hr recall:  B ( AM): pork chop biscuit if working.  Will skip on days off.  Might grab candy bar Snk ( AM): not usually  L ( PM): salad, sub, burger Snk ( PM): fruit, peanuts, doughnuts, chips D ( PM): fried chicken, corn, broccoli.  Might have breakfast foods; spaghetti; steak and fries Snk ( PM): not usually Beverages: sweet tea, water sometimes, soda  Usual physical activity: none  Estimated energy needs: 1800 calories 200 g carbohydrates 135 g protein 50 g fat  Progress Towards Goal(s):  In progress.   Nutritional Diagnosis:  NB-1.1 Food and nutrition-related knowledge deficit As related to proper balance of fat, carbohydrates, and proteins.  As evidenced by obesity and HbA1c 5.9%.    Intervention:  Nutrition counseling provided.  Discussed diabetes disease process and treatment options.  Discussed physiology of diabetes and role of obesity on insulin resistance.  Encouraged moderate weight reduction to improve glucose levels.  Discussed role of medications and diet in glucose control  Provided education  on macronutrients on glucose levels.  Provided education on carb counting, importance of regularly scheduled meals/snacks, and meal planning  Discussed effects of physical activity on glucose levels and long-term glucose control.  Recommended 150 minutes of physical activity/week.  Reviewed patient medications.  Discussed role of medication on blood glucose and possible side effects  Discussed blood glucose monitoring and interpretation.  Discussed recommended target ranges and individual ranges.    Described short-term complications: hyper- and hypo-glycemia.  Discussed causes,symptoms, and treatment options.  Discussed prevention, detection, and treatment of long-term complications.  Discussed the role of prolonged elevated glucose levels on body systems.  Discussed role of stress on blood glucose levels and discussed strategies to manage psychosocial issues.  Discussed recommendations for long-term diabetes self-care.  Established checklist for medical, dental, and emotional self-care.  Teaching Method Utilized:  Visual Auditory  Handouts given during visit include: Living Well with Diabetes Carb Counting and Food Label handouts Meal Plan Card  Barriers to learning/adherence to lifestyle change: none  Diabetes self-care support plan:   [facility] support group  [facility]  Demonstrated degree of understanding via:  Teach Back   Monitoring/Evaluation:  Dietary intake, exercise, BGM, and body weight in a few day(s).  Will attend Core 2 and Core 3

## 2013-02-20 DIAGNOSIS — E119 Type 2 diabetes mellitus without complications: Secondary | ICD-10-CM

## 2013-02-20 NOTE — Progress Notes (Signed)
Patient was seen on 02/20/13 for the second of a series of three diabetes self-management courses at the [facility]. The following learning objectives were met by the patient during this class:   Describe the role of different macronutrients on glucose  Explain how carbohydrates affect blood glucose  State what foods contain the most carbohydrates  Demonstrate carbohydrate counting  Demonstrate how to read Nutrition Facts food label  Describe effects of various fats on heart health  Describe the importance of good nutrition for health and healthy eating strategies  Describe techniques for managing your shopping, cooking and meal planning  List strategies to follow meal plan when dining out  Describe the effects of alcohol on glucose and how to use it safely  Follow-Up Plan:  Attend Core 3  Work towards following your personal food plan.   

## 2013-02-24 ENCOUNTER — Telehealth: Payer: Self-pay

## 2013-02-24 ENCOUNTER — Other Ambulatory Visit: Payer: 59

## 2013-02-24 NOTE — Telephone Encounter (Signed)
Pt called stating that she was at [facility] and there were no orders for her to have labs drawn.   I informed pt that MD's AVS stated to call us and let us know when she wanted to get her labs done at [facility].  Pt states she told Marj when she checked out she would go a week before her appt. Pt states that Marj even wrote this on her papers?  I told pt I was sorry but we never got that message and I was going to have to send MD a note to see what labs need to be done.  Pt voiced understanding and stated she would just have to go back later to get them done.  Please advise what labs need to be done and diagnosis?

## 2013-02-24 NOTE — Telephone Encounter (Signed)
So her cholesterol, sugar etc labs are too soon, could have her tsh and freeT4 done now or can wait til her other labs are due any time after 03/20/13. I cannot see the box I typed in so I do not know what else was asked for please check. Her every 3 month labs are not due yet

## 2013-02-27 ENCOUNTER — Ambulatory Visit: Payer: Self-pay

## 2013-03-03 ENCOUNTER — Ambulatory Visit (INDEPENDENT_AMBULATORY_CARE_PROVIDER_SITE_OTHER): Payer: 59 | Admitting: Family Medicine

## 2013-03-03 ENCOUNTER — Encounter: Payer: Self-pay | Admitting: Family Medicine

## 2013-03-03 VITALS — BP 128/84 | HR 73 | Temp 98.4°F | Resp 16 | Ht 64.0 in | Wt 241.0 lb

## 2013-03-03 DIAGNOSIS — M545 Low back pain, unspecified: Secondary | ICD-10-CM | POA: Insufficient documentation

## 2013-03-03 DIAGNOSIS — I10 Essential (primary) hypertension: Secondary | ICD-10-CM

## 2013-03-03 DIAGNOSIS — E049 Nontoxic goiter, unspecified: Secondary | ICD-10-CM

## 2013-03-03 DIAGNOSIS — M549 Dorsalgia, unspecified: Secondary | ICD-10-CM | POA: Insufficient documentation

## 2013-03-03 DIAGNOSIS — E039 Hypothyroidism, unspecified: Secondary | ICD-10-CM

## 2013-03-03 DIAGNOSIS — K219 Gastro-esophageal reflux disease without esophagitis: Secondary | ICD-10-CM

## 2013-03-03 DIAGNOSIS — J309 Allergic rhinitis, unspecified: Secondary | ICD-10-CM

## 2013-03-03 HISTORY — DX: Low back pain, unspecified: M54.50

## 2013-03-03 NOTE — Telephone Encounter (Signed)
LMOM with contact name and number for return call RE: labs and further provider instructions/SLS

## 2013-03-03 NOTE — Progress Notes (Signed)
Patient ID: [name], female   DOB: November 11, 1960, 52 y.o.   MRN: [id number] EMMALYNE GIACOMO [id number] [birth year] 03/03/2013      Progress Note-Follow Up  Subjective  Chief Complaint  Chief Complaint  Patient presents with  . Follow-up    1-mth [DM, HTN, ADJ, Hypothyroid, AR, Hypokalemia]    HPI  Patient is a 52 year old American female who is in today for followup. She continues to struggle with hoarseness. It's been seen by ENT and they report the only finding could be consistent with reflux. No vocal cord polyps or other concerning lesions. No dysphasia. Heartburn is persistent and she wakes up with a burning sensation in her chest frequently. No abdominal pain diarrhea or constipation. No fevers or chills. No nausea vomiting. No shortness of breath or palpitations noted. She has some ongoing fatigue and myalgias but does report she feels somewhat better since starting an over-the-counter thyroid. She is struggling with some mild low back and hip pain is considering seeing a chiropractor. It is worse with use.  Past Medical History  Diagnosis Date  . GERD (gastroesophageal reflux disease)   . Hypertension   . Thyroid disease     hypothyroidism  . Anxiety   . Depression   . Hyperlipidemia   . Chronic rhinosinusitis   . OSA (obstructive sleep apnea)   . Obesity   . Colon polyps   . Fibroid, uterine   . Hoarseness   . Glaucoma 12-13  . IBS (irritable bowel syndrome) 02/13/2011  . Perimenopause 10/05/2012  . Edema 01/06/2013  . Diabetes 01/06/2013  . Chronic pain syndrome 01/06/2013  . Cough 01/06/2013  . Hypokalemia 02/05/2013    Past Surgical History  Procedure Laterality Date  . Cholecystectomy    . Abdominal hysterectomy      Family History  Problem Relation Age of Onset  . Alcohol abuse Father   . Cancer Father     renal and colon  . Hyperlipidemia Father   . Hypertension Father   . Diabetes    . Cancer Paternal Grandfather     colon    History    Social History  . Marital Status: Single    Spouse Name: N/A    Number of Children: N/A  . Years of Education: N/A   Occupational History  . Not on file.   Social History Main Topics  . Smoking status: Former Smoker -- 0.50 packs/day for 30 years    Types: Cigarettes    Quit date: 04/25/2003  . Smokeless tobacco: Never Used     Comment: smoked since age 66  . Alcohol Use: No  . Drug Use: Not on file  . Sexual Activity: Not on file   Other Topics Concern  . Not on file   Social History Narrative   Endo-- Dr Lucianne Muss   ENT--Dr shoemaker   GI--Dr Buccini   Pulm--Dr Clance   Rheum--Dr Kellie Simmering          Current Outpatient Prescriptions on File Prior to Visit  Medication Sig Dispense Refill  . ALPRAZolam (XANAX) 0.25 MG tablet Take 1 tablet (0.25 mg total) by mouth 2 (two) times daily as needed for sleep.  20 tablet  0  . b complex vitamins tablet Take 1 tablet by mouth daily.      . Beta Carotene (VITAMIN A) 25000 UNIT capsule Take 25,000 Units by mouth daily.      . cholecalciferol (VITAMIN D-400) 400 UNITS TABS Take 400 Units by mouth daily.      Marland Kitchen  clotrimazole (LOTRIMIN) 1 % cream Apply topically 2 (two) times daily.  60 g  1  . diphenhydrAMINE (BENADRYL) 25 MG tablet Take 25 mg by mouth at bedtime as needed.        Marland Kitchen estradiol (ESTRACE) 1 MG tablet Take 1 mg by mouth daily.        . furosemide (LASIX) 20 MG tablet Take 1 tablet (20 mg total) by mouth daily as needed for fluid or edema.  30 tablet  3  . glucose blood (TRUE CARE TEST STRIP PACK) test strip Use as instructed  100 each  3  . HYDROcodone-homatropine (HYCODAN) 5-1.5 MG/5ML syrup Take 5 mLs by mouth 3 (three) times daily as needed for cough.  140 mL  0  . latanoprost (XALATAN) 0.005 % ophthalmic solution Place 1 drop into both eyes at bedtime.      Marland Kitchen losartan-hydrochlorothiazide (HYZAAR) 50-12.5 MG per tablet TAKE 1 TABLET BY MOUTH DAILY  90 tablet  1  . metFORMIN (GLUCOPHAGE-XR) 500 MG 24 hr tablet Take 500 mg  by mouth daily with breakfast.      . nebivolol (BYSTOLIC) 5 MG tablet Take 1 tablet (5 mg total) by mouth daily. Failed Metoprolol  30 tablet  1  . nitrofurantoin, macrocrystal-monohydrate, (MACROBID) 100 MG capsule Take 1 capsule (100 mg total) by mouth 2 (two) times daily.  6 capsule  0  . omeprazole-sodium bicarbonate (ZEGERID) 40-1100 MG per capsule Take 1 capsule by mouth daily.  30 capsule  4  . potassium chloride SA (KLOR-CON M20) 20 MEQ tablet Take 1 tablet (20 mEq total) by mouth daily.  90 tablet  3  . ranitidine (ZANTAC) 150 MG tablet Take 1 tablet (150 mg total) by mouth at bedtime.  30 tablet  2  . thyroid (ARMOUR THYROID) 120 MG tablet Take 1 tablet (120 mg total) by mouth daily before breakfast.  30 tablet  3  . TRUEPLUS LANCETS 30G MISC 1 Device by Does not apply route daily as needed. DX 250.00 by Does not apply route daily as needed. DX 250.00  100 each  2  . venlafaxine XR (EFFEXOR-XR) 150 MG 24 hr capsule TAKE 1 CAPSULE BY MOUTH DAILY.  30 capsule  3   No current facility-administered medications on file prior to visit.    Allergies  Allergen Reactions  . Aspirin     REACTION: Upset stomach  . Amoxicillin Rash    Review of Systems  Review of Systems  Constitutional: Negative for fever and malaise/fatigue.  HENT: Negative for congestion.        Hoarseness.  Eyes: Negative for discharge.  Respiratory: Negative for shortness of breath.   Cardiovascular: Negative for chest pain, palpitations and leg swelling.  Gastrointestinal: Positive for heartburn. Negative for nausea, abdominal pain and diarrhea.       Has burning in chest when she rolls over at night at times  Genitourinary: Negative for dysuria.  Musculoskeletal: Negative for falls.  Skin: Negative for rash.  Neurological: Negative for loss of consciousness and headaches.  Endo/Heme/Allergies: Negative for polydipsia.  Psychiatric/Behavioral: Negative for depression and suicidal ideas. The patient is not  nervous/anxious and does not have insomnia.     Objective  BP 128/84  Pulse 73  Temp(Src) 98.4 F (36.9 C) (Oral)  Resp 16  Ht 5\' 4"  (1.626 m)  Wt 241 lb (109.317 kg)  BMI 41.35 kg/m2  SpO2 97%  Physical Exam  Physical Exam  Constitutional: She is oriented to person, place, and time  and well-developed, well-nourished, and in no distress. No distress.  HENT:  Head: Normocephalic and atraumatic.  Eyes: Conjunctivae are normal.  Neck: Neck supple. No thyromegaly present.  Cardiovascular: Normal rate, regular rhythm and normal heart sounds.   No murmur heard. Pulmonary/Chest: Effort normal and breath sounds normal. She has no wheezes.  Abdominal: She exhibits no distension and no mass.  Musculoskeletal: She exhibits no edema.  Lymphadenopathy:    She has no cervical adenopathy.  Neurological: She is alert and oriented to person, place, and time.  Skin: Skin is warm and dry. No rash noted. She is not diaphoretic.  Psychiatric: Memory, affect and judgment normal.    Lab Results  Component Value Date   TSH 0.11* 01/14/2013   Lab Results  Component Value Date   WBC 6.3 01/14/2013   HGB 14.4 01/14/2013   HCT 42.0 01/14/2013   MCV 86.1 01/14/2013   PLT 348.0 01/14/2013   Lab Results  Component Value Date   CREATININE 0.9 12/18/2012   BUN 10 12/18/2012   NA 137 12/18/2012   K 3.9 12/18/2012   CL 103 12/18/2012   CO2 26 12/18/2012   Lab Results  Component Value Date   ALT 23 12/18/2012   AST 26 12/18/2012   ALKPHOS 53 12/18/2012   BILITOT 0.3 12/18/2012   Lab Results  Component Value Date   CHOL 190 12/18/2012   Lab Results  Component Value Date   HDL 46.30 12/18/2012   Lab Results  Component Value Date   LDLCALC 104* 03/25/2012   Lab Results  Component Value Date   TRIG 241.0* 12/18/2012   Lab Results  Component Value Date   CHOLHDL 4 12/18/2012     Assessment & Plan  BACK PAIN, LUMBAR, CHRONIC Some b/l hip pain, consider Salon Pas and  chiropractors  THYROMEGALY Thyroid ultrasound normal in 10/14  GERD Patient believes she had an upper endoscopy recently with her colonoscopy, we have received a copy of the colonoscopy but not the upper endoscopy and patient was sedated. Will request copy of upper endoscopy and if not completed need to consider this due to persistent hoarseness and reflux, avoid offending foods, no eating prior to bedtime. Add 2 Tums qhs  HYPERTENSION Well controlled today no changes.  HYPOTHYROIDISM tsh mildly suppressed at last blood draw but t4 normal, has started an herbal thyroid supplement she has taken in past and feels somewhat better, agrees to repeat lab work next month  ALLERGIC RHINITIS Recently seen by ENT and the only findings were c/w reflux

## 2013-03-03 NOTE — Assessment & Plan Note (Signed)
Some b/l hip pain, consider Sal[place] and chiropractors

## 2013-03-03 NOTE — Patient Instructions (Signed)
Take 2 Tums at bedtime Keep Mylanta at the bedside take if symptoms occur overnight  Gastroesophageal Reflux Disease, Adult Gastroesophageal reflux disease (GERD) happens when acid from your stomach flows up into the esophagus. When acid comes in contact with the esophagus, the acid causes soreness (inflammation) in the esophagus. Over time, GERD may create small holes (ulcers) in the lining of the esophagus. CAUSES   Increased body weight. This puts pressure on the stomach, making acid rise from the stomach into the esophagus.  Smoking. This increases acid production in the stomach.  Drinking alcohol. This causes decreased pressure in the lower esophageal sphincter (valve or ring of muscle between the esophagus and stomach), allowing acid from the stomach into the esophagus.  Late evening meals and a full stomach. This increases pressure and acid production in the stomach.  A malformed lower esophageal sphincter. Sometimes, no cause is found. SYMPTOMS   Burning pain in the lower part of the mid-chest behind the breastbone and in the mid-stomach area. This may occur twice a week or more often.  Trouble swallowing.  Sore throat.  Dry cough.  Asthma-like symptoms including chest tightness, shortness of breath, or wheezing. DIAGNOSIS  Your caregiver may be able to diagnose GERD based on your symptoms. In some cases, X-rays and other tests may be done to check for complications or to check the condition of your stomach and esophagus. TREATMENT  Your caregiver may recommend over-the-counter or prescription medicines to help decrease acid production. Ask your caregiver before starting or adding any new medicines.  HOME CARE INSTRUCTIONS   Change the factors that you can control. Ask your caregiver for guidance concerning weight loss, quitting smoking, and alcohol consumption.  Avoid foods and drinks that make your symptoms worse, such as:  Caffeine or alcoholic  drinks.  Chocolate.  Peppermint or mint flavorings.  Garlic and onions.  Spicy foods.  Citrus fruits, such as oranges, lemons, or limes.  Tomato-based foods such as sauce, chili, salsa, and pizza.  Fried and fatty foods.  Avoid lying down for the 3 hours prior to your bedtime or prior to taking a nap.  Eat small, frequent meals instead of large meals.  Wear loose-fitting clothing. Do not wear anything tight around your waist that causes pressure on your stomach.  Raise the head of your bed 6 to 8 inches with wood blocks to help you sleep. Extra pillows will not help.  Only take over-the-counter or prescription medicines for pain, discomfort, or fever as directed by your caregiver.  Do not take aspirin, ibuprofen, or other nonsteroidal anti-inflammatory drugs (NSAIDs). SEEK IMMEDIATE MEDICAL CARE IF:   You have pain in your arms, neck, jaw, teeth, or back.  Your pain increases or changes in intensity or duration.  You develop nausea, vomiting, or sweating (diaphoresis).  You develop shortness of breath, or you faint.  Your vomit is green, yellow, black, or looks like coffee grounds or blood.  Your stool is red, bloody, or black. These symptoms could be signs of other problems, such as heart disease, gastric bleeding, or esophageal bleeding. MAKE SURE YOU:   Understand these instructions.  Will watch your condition.  Will get help right away if you are not doing well or get worse. Document Released: 01/18/2005 Document Revised: 07/03/2011 Document Reviewed: 10/28/2010 [facility] Patient Information 2014 [place], [place].

## 2013-03-03 NOTE — Assessment & Plan Note (Signed)
Well controlled today no changes 

## 2013-03-03 NOTE — Telephone Encounter (Signed)
Pt had 8:00a appointment this morning/SLS

## 2013-03-03 NOTE — Assessment & Plan Note (Signed)
Thyroid ultrasound normal in 10/14

## 2013-03-03 NOTE — Assessment & Plan Note (Signed)
Recently seen by ENT and the only findings were c/w reflux

## 2013-03-03 NOTE — Assessment & Plan Note (Addendum)
Patient believes she had an upper endoscopy recently with her colonoscopy, we have received a copy of the colonoscopy but not the upper endoscopy and patient was sedated. Will request copy of upper endoscopy and if not completed need to consider this due to persistent hoarseness and reflux, avoid offending foods, no eating prior to bedtime. Add 2 Tums qhs

## 2013-03-03 NOTE — Assessment & Plan Note (Signed)
tsh mildly suppressed at last blood draw but t4 normal, has started an herbal thyroid supplement she has taken in past and feels somewhat better, agrees to repeat lab work next month

## 2013-03-04 ENCOUNTER — Encounter: Payer: 59 | Attending: Family Medicine

## 2013-03-04 DIAGNOSIS — E119 Type 2 diabetes mellitus without complications: Secondary | ICD-10-CM | POA: Insufficient documentation

## 2013-03-04 DIAGNOSIS — Z713 Dietary counseling and surveillance: Secondary | ICD-10-CM | POA: Insufficient documentation

## 2013-03-05 NOTE — Progress Notes (Signed)
Patient was seen on 03/04/13 for the third of a series of three diabetes self-management courses at the [facility]. The following learning objectives were met by the patient during this class:    State the amount of activity recommended for healthy living   Describe activities suitable for individual needs   Identify ways to regularly incorporate activity into daily life   Identify barriers to activity and ways to over come these barriers  Identify diabetes medications being personally used and their primary action for lowering glucose and possible side effects   Describe role of stress on blood glucose and develop strategies to address psychosocial issues   Identify diabetes complications and ways to prevent them  Explain how to manage diabetes during illness   Evaluate success in meeting personal goal   Establish 2-3 goals that they will plan to diligently work on until they return for the free 30-month follow-up visit  Your patient has established the following 4 month goals in their individualized success plan: I will increase my activity level at least 3 days a week I will be active 30 minutes or more 3 days a week I will test my glucose at least 1 time a day, 1 day a week  Your patient has identified these potential barriers to change:  None stated   Your patient has identified their diabetes self-care support plan as  None stated, support group available

## 2013-03-12 ENCOUNTER — Encounter: Payer: Self-pay | Admitting: Family Medicine

## 2013-03-25 ENCOUNTER — Encounter: Payer: Self-pay | Admitting: Family Medicine

## 2013-03-25 DIAGNOSIS — E119 Type 2 diabetes mellitus without complications: Secondary | ICD-10-CM

## 2013-03-25 DIAGNOSIS — E039 Hypothyroidism, unspecified: Secondary | ICD-10-CM

## 2013-03-25 DIAGNOSIS — E785 Hyperlipidemia, unspecified: Secondary | ICD-10-CM

## 2013-03-25 DIAGNOSIS — I10 Essential (primary) hypertension: Secondary | ICD-10-CM

## 2013-03-25 DIAGNOSIS — E559 Vitamin D deficiency, unspecified: Secondary | ICD-10-CM

## 2013-03-27 NOTE — Telephone Encounter (Signed)
Please advise 

## 2013-03-31 ENCOUNTER — Other Ambulatory Visit: Payer: Self-pay | Admitting: Internal Medicine

## 2013-03-31 LAB — LIPID PANEL
Cholesterol: 200 mg/dL (ref 0–200)
HDL: 49 mg/dL (ref >39)
LDL Cholesterol: 117 mg/dL — ABNORMAL HIGH (ref 0–99)
Total CHOL/HDL Ratio: 4.1 Ratio
Triglycerides: 169 mg/dL — ABNORMAL HIGH (ref <150)
VLDL: 34 mg/dL (ref 0–40)

## 2013-03-31 LAB — TSH: TSH: <0.008 u[IU]/mL — ABNORMAL LOW (ref 0.350–4.500)

## 2013-03-31 LAB — RENAL FUNCTION PANEL
Albumin: 4.1 g/dL (ref 3.5–5.2)
BUN: 8 mg/dL (ref 6–23)
CO2: 27 mEq/L (ref 19–32)
Calcium: 10 mg/dL (ref 8.4–10.5)
Chloride: 102 mEq/L (ref 96–112)
Creat: 0.86 mg/dL (ref 0.50–1.10)
Glucose, Bld: 86 mg/dL (ref 70–99)
Phosphorus: 3.5 mg/dL (ref 2.3–4.6)
Potassium: 4.2 mEq/L (ref 3.5–5.3)
Sodium: 138 mEq/L (ref 135–145)

## 2013-03-31 LAB — CBC
HCT: 43.5 % (ref 36.0–46.0)
Hemoglobin: 14.8 g/dL (ref 12.0–15.0)
MCH: 29.5 pg (ref 26.0–34.0)
MCHC: 34 g/dL (ref 30.0–36.0)
MCV: 86.8 fL (ref 78.0–100.0)
Platelets: 330 10*3/uL (ref 150–400)
RBC: 5.01 MIL/uL (ref 3.87–5.11)
RDW: 13.9 % (ref 11.5–15.5)
WBC: 3.5 10*3/uL — ABNORMAL LOW (ref 4.0–10.5)

## 2013-03-31 LAB — HEMOGLOBIN A1C
Hgb A1c MFr Bld: 6.1 % — ABNORMAL HIGH (ref <5.7)
Mean Plasma Glucose: 128 mg/dL — ABNORMAL HIGH (ref <117)

## 2013-03-31 LAB — HEPATIC FUNCTION PANEL
ALT: 17 U/L (ref 0–35)
AST: 16 U/L (ref 0–37)
Albumin: 4.1 g/dL (ref 3.5–5.2)
Alkaline Phosphatase: 69 U/L (ref 39–117)
Bilirubin, Direct: 0.1 mg/dL (ref 0.0–0.3)
Indirect Bilirubin: 0.3 mg/dL (ref 0.0–0.9)
Total Bilirubin: 0.4 mg/dL (ref 0.3–1.2)
Total Protein: 6.9 g/dL (ref 6.0–8.3)

## 2013-03-31 LAB — T4, FREE: Free T4: 1.02 ng/dL (ref 0.80–1.80)

## 2013-04-07 ENCOUNTER — Ambulatory Visit (INDEPENDENT_AMBULATORY_CARE_PROVIDER_SITE_OTHER): Payer: 59 | Admitting: Family Medicine

## 2013-04-07 ENCOUNTER — Encounter: Payer: Self-pay | Admitting: Family Medicine

## 2013-04-07 VITALS — BP 112/70 | HR 79 | Temp 98.7°F | Ht 59.0 in | Wt 236.0 lb

## 2013-04-07 DIAGNOSIS — R7309 Other abnormal glucose: Secondary | ICD-10-CM

## 2013-04-07 DIAGNOSIS — E669 Obesity, unspecified: Secondary | ICD-10-CM

## 2013-04-07 DIAGNOSIS — E785 Hyperlipidemia, unspecified: Secondary | ICD-10-CM

## 2013-04-07 DIAGNOSIS — R739 Hyperglycemia, unspecified: Secondary | ICD-10-CM

## 2013-04-07 DIAGNOSIS — I10 Essential (primary) hypertension: Secondary | ICD-10-CM

## 2013-04-07 DIAGNOSIS — J309 Allergic rhinitis, unspecified: Secondary | ICD-10-CM

## 2013-04-07 DIAGNOSIS — E039 Hypothyroidism, unspecified: Secondary | ICD-10-CM

## 2013-04-07 DIAGNOSIS — K219 Gastro-esophageal reflux disease without esophagitis: Secondary | ICD-10-CM

## 2013-04-07 MED ORDER — RANITIDINE HCL 300 MG PO TABS: 300.0000 mg | ORAL_TABLET | Freq: Every day | ORAL | Status: DC

## 2013-04-07 MED ORDER — PHENTERMINE HCL 15 MG PO CAPS: 15.0000 mg | ORAL_CAPSULE | ORAL | Status: DC

## 2013-04-07 NOTE — Patient Instructions (Signed)
Restart a probiotic.   Obesity Obesity is defined as having too much total body fat and a body mass index (BMI) of 30 or more. BMI is an estimate of body fat and is calculated from your height and weight. Obesity happens when you consume more calories than you can burn by exercising or performing daily physical tasks. Prolonged obesity can cause major illnesses or emergencies, such as:   A stroke.  Heart disease.  Diabetes.  Cancer.  Arthritis.  High blood pressure (hypertension).  High cholesterol.  Sleep apnea.  Erectile dysfunction.  Infertility problems. CAUSES   Regularly eating unhealthy foods.  Physical inactivity.  Certain disorders, such as an underactive thyroid (hypothyroidism), Cushing's syndrome, and polycystic ovarian syndrome.  Certain medicines, such as steroids, some depression medicines, and antipsychotics.  Genetics.  Lack of sleep. DIAGNOSIS  A caregiver can diagnose obesity after calculating your BMI. Obesity will be diagnosed if your BMI is 30 or higher.  There are other methods of measuring obesity levels. Some other methods include measuring your skin fold thickness, your waist circumference, and comparing your hip circumference to your waist circumference. TREATMENT  A healthy treatment program includes some or all of the following:  Long-term dietary changes.  Exercise and physical activity.  Behavioral and lifestyle changes.  Medicine only under the supervision of your caregiver. Medicines may help, but only if they are used with diet and exercise programs. An unhealthy treatment program includes:  Fasting.  Fad diets.  Supplements and drugs. These choices do not succeed in long-term weight control.  HOME CARE INSTRUCTIONS   Exercise and perform physical activity as directed by your caregiver. To increase physical activity, try the following:  Use stairs instead of elevators.  Park farther away from store entrances.  Garden,  bike, or walk instead of watching television or using the computer.  Eat healthy, low-calorie foods and drinks on a regular basis. Eat more fruits and vegetables. Use low-calorie cookbooks or take healthy cooking classes.  Limit fast food, sweets, and processed snack foods.  Eat smaller portions.  Keep a daily journal of everything you eat. There are many free websites to help you with this. It may be helpful to measure your foods so you can determine if you are eating the correct portion sizes.  Avoid drinking alcohol. Drink more water and drinks without calories.  Take vitamins and supplements only as recommended by your caregiver.  Weight-loss support groups, Optometrist, counselors, and stress reduction education can also be very helpful. SEEK IMMEDIATE MEDICAL CARE IF:  You have chest pain or tightness.  You have trouble breathing or feel short of breath.  You have weakness or leg numbness.  You feel confused or have trouble talking.  You have sudden changes in your vision. MAKE SURE YOU:  Understand these instructions.  Will watch your condition.  Will get help right away if you are not doing well or get worse. Document Released: 05/18/2004 Document Revised: 10/10/2011 Document Reviewed: 05/17/2011 [facility] Patient Information 2014 [place], [place].

## 2013-04-11 ENCOUNTER — Encounter: Payer: Self-pay | Admitting: Internal Medicine

## 2013-04-11 ENCOUNTER — Ambulatory Visit (INDEPENDENT_AMBULATORY_CARE_PROVIDER_SITE_OTHER): Payer: 59 | Admitting: Internal Medicine

## 2013-04-11 VITALS — BP 126/82 | HR 90 | Temp 98.4°F | Resp 12 | Ht 64.5 in | Wt 236.4 lb

## 2013-04-11 DIAGNOSIS — E049 Nontoxic goiter, unspecified: Secondary | ICD-10-CM

## 2013-04-11 DIAGNOSIS — E039 Hypothyroidism, unspecified: Secondary | ICD-10-CM

## 2013-04-11 MED ORDER — THYROID 90 MG PO TABS: 90.0000 mg | ORAL_TABLET | Freq: Every day | ORAL | Status: DC

## 2013-04-11 NOTE — Progress Notes (Addendum)
Patient ID: [name], female   DOB: 1960/09/06, 52 y.o.   MRN: [id number]   HPI  [name] is a 52 y.o.-year-old female, referred by her PCP, Dr. Abner Greenspan, for evaluation for hypothyroidism.  Pt. has been dx with hypothyroidism in 2007 (TSH 59; CK then 2471); started on Synthroid, then switched to Armour by Dr Lucianne Muss; then back on Levothyroxine (but sluggish and tired) so in 09/2012 switched to Armour 90 initially, then increased to 120 mg in 12/2012 >> she tells me she finally feels well on this dose.  She takes it: - fasting - with water - separated by >30 min from b'fast  - separated by >4h from PPIs, multivitamins   I reviewed pt's thyroid tests: Lab Results  Component Value Date   TSH <0.008* 03/31/2013   TSH 0.11* 01/14/2013   TSH 0.35 12/18/2012   TSH 0.011* 10/01/2012   TSH 0.26* 07/25/2012   TSH 1.23 12/26/2011   TSH 0.58 05/01/2011   TSH 0.71 02/06/2011   TSH 0.76 08/08/2010   TSH 0.94 06/10/2010   FREET4 1.02 03/31/2013   FREET4 0.63 01/14/2013   FREET4 0.74 12/18/2012   FREET4 1.20 10/01/2012   FREET4 1.37 07/30/2012   FREET4 0.66 12/26/2011   FREET4 0.63 05/01/2011   FREET4 0.59* 02/06/2011   FREET4 0.5* 09/28/2008    Pt denies feeling nodules in neck, + hoarseness (lost her voice this summer), dysphagia/odynophagia, SOB with lying down.  Pt describes: - no cold intolerance, but hands and feet cold; otherwise may feel hot, and sweats - + weight gain ~ 60 lbs since dx of hypothyroidism - + fatigue - + N/V/D/C/reflux - no dry skin - no hair falling - + mild depression  She has + FH of thyroid disorders in: aunt. No FH of thyroid cancer.  No h/o radiation tx to head or neck.  No recent use of iodine supplements, used them in the past.  I reviewed her chart and she also has a history of TAH in 2001, BSO in 2005.   ROS: Constitutional: see HPI Eyes: no blurry vision, no xerophthalmia ENT: no sore throat, no nodules palpated in throat, no dysphagia/odynophagia, +  hoarseness, + tinnitus, + hypoacusis Cardiovascular: no CP/SOB/palpitations/leg swelling Respiratory: no cough/SOB Gastrointestinal: + N/V/D/C Musculoskeletal: no muscle/joint aches Skin: no rashes Neurological: no tremors/numbness/tingling/dizziness Psychiatric: no depression/anxiety  Past Medical History  Diagnosis Date  . GERD (gastroesophageal reflux disease)   . Hypertension   . Thyroid disease     hypothyroidism  . Anxiety   . Depression   . Hyperlipidemia   . Chronic rhinosinusitis   . OSA (obstructive sleep apnea)   . Obesity   . Colon polyps   . Fibroid, uterine   . Hoarseness   . Glaucoma 12-13  . IBS (irritable bowel syndrome) 02/13/2011  . Perimenopause 10/05/2012  . Edema 01/06/2013  . Diabetes 01/06/2013  . Chronic pain syndrome 01/06/2013  . Cough 01/06/2013  . Hypokalemia 02/05/2013  . Low back pain 03/03/2013   Past Surgical History  Procedure Laterality Date  . Cholecystectomy    . Abdominal hysterectomy     History   Social History  . Marital Status: Single    Spouse Name: N/A    Number of Children: 0   Occupational History  . [employer] - telemetry tech   Social History Main Topics  . Smoking status: Former Smoker -- 0.50 packs/day for 30 years    Types: Cigarettes    Quit date: 04/25/2003  .  Smokeless tobacco: Never Used     Comment: smoked since age 52  . Alcohol Use: No  . Drug Use: No  . Sexual Activity: No   Social History Narrative   Endo-- prev. Dr Lucianne Muss   ENT--Dr shoemaker   GI--Dr Buccini   Pulm--Dr Clance   Rheum--Dr Kellie Simmering   Current Outpatient Prescriptions on File Prior to Visit  Medication Sig Dispense Refill  . ALPRAZolam (XANAX) 0.25 MG tablet Take 1 tablet (0.25 mg total) by mouth 2 (two) times daily as needed for sleep.  20 tablet  0  . b complex vitamins tablet Take 1 tablet by mouth daily.      . Beta Carotene (VITAMIN A) 25000 UNIT capsule Take 25,000 Units by mouth daily.      . cholecalciferol (VITAMIN D-400) 400  UNITS TABS Take 400 Units by mouth daily.      . clotrimazole (LOTRIMIN) 1 % cream Apply topically 2 (two) times daily.  60 g  1  . diphenhydrAMINE (BENADRYL) 25 MG tablet Take 25 mg by mouth at bedtime as needed.        Marland Kitchen estradiol (ESTRACE) 1 MG tablet Take 1 mg by mouth daily.        . furosemide (LASIX) 20 MG tablet Take 1 tablet (20 mg total) by mouth daily as needed for fluid or edema.  30 tablet  3  . glucose blood (TRUE CARE TEST STRIP PACK) test strip Use as instructed  100 each  3  . HYDROcodone-homatropine (HYCODAN) 5-1.5 MG/5ML syrup Take 5 mLs by mouth 3 (three) times daily as needed for cough.  140 mL  0  . latanoprost (XALATAN) 0.005 % ophthalmic solution Place 1 drop into both eyes at bedtime.      Marland Kitchen losartan-hydrochlorothiazide (HYZAAR) 50-12.5 MG per tablet TAKE 1 TABLET BY MOUTH DAILY  90 tablet  1  . metFORMIN (GLUCOPHAGE-XR) 500 MG 24 hr tablet Take 500 mg by mouth daily with breakfast.      . metFORMIN (GLUCOPHAGE-XR) 500 MG 24 hr tablet TAKE 2 TABLETS BY MOUTH DAILY WITH BREAKFAST.  60 tablet  11  . nebivolol (BYSTOLIC) 5 MG tablet Take 1 tablet (5 mg total) by mouth daily. Failed Metoprolol  30 tablet  1  . nitrofurantoin, macrocrystal-monohydrate, (MACROBID) 100 MG capsule Take 1 capsule (100 mg total) by mouth 2 (two) times daily.  6 capsule  0  . omeprazole-sodium bicarbonate (ZEGERID) 40-1100 MG per capsule Take 1 capsule by mouth daily.  30 capsule  4  . phentermine 15 MG capsule Take 1 capsule (15 mg total) by mouth every morning.  30 capsule  1  . potassium chloride SA (KLOR-CON M20) 20 MEQ tablet Take 1 tablet (20 mEq total) by mouth daily.  90 tablet  3  . ranitidine (ZANTAC) 300 MG tablet Take 1 tablet (300 mg total) by mouth at bedtime.  30 tablet  5  . thyroid (ARMOUR THYROID) 120 MG tablet Take 1 tablet (120 mg total) by mouth daily before breakfast.  30 tablet  3  . TRUEPLUS LANCETS 30G MISC 1 Device by Does not apply route daily as needed. DX 250.00 by Does  not apply route daily as needed. DX 250.00  100 each  2  . venlafaxine XR (EFFEXOR-XR) 150 MG 24 hr capsule TAKE 1 CAPSULE BY MOUTH DAILY.  30 capsule  3   No current facility-administered medications on file prior to visit.   Allergies  Allergen Reactions  . Aspirin  REACTION: Upset stomach  . Amoxicillin Rash   Family History  Problem Relation Age of Onset  . Alcohol abuse Father   . Cancer Father     renal and colon  . Hyperlipidemia Father   . Hypertension Father   . Diabetes    . Cancer Paternal Grandfather     colon   PE: BP 126/82  Pulse 90  Temp(Src) 98.4 F (36.9 C) (Oral)  Resp 12  Ht 5' 4.5" (1.638 m)  Wt 236 lb 6.4 oz (107.23 kg)  BMI 39.97 kg/m2  SpO2 97% Wt Readings from Last 3 Encounters:  04/11/13 236 lb 6.4 oz (107.23 kg)  04/07/13 236 lb 0.6 oz (107.067 kg)  03/03/13 241 lb (109.317 kg)   Constitutional: obese, lost her voice >> talks softly, in NAD Eyes: PERRLA, EOMI, no exophthalmos ENT: moist mucous membranes, no thyromegaly, no cervical lymphadenopathy Cardiovascular: RRR, No MRG Respiratory: CTA B Gastrointestinal: abdomen soft, NT, ND, BS+ Musculoskeletal: no deformities, strength intact in all 4 Skin: moist, warm, no rashes, but severe acanthosis nigricans - neck, face, hands Neurological: no tremor with outstretched hands, DTR normal in all 4  ASSESSMENT: 1. Hypothyroidism - Thyroid U/S normal 01/2013  PLAN:  1. Patient with long-standing hypothyroidism, on Armour therapy, with undetectable TSH at last check. She appears euthyroid. - She does not appear to have a goiter, thyroid nodules, or neck compression symptoms - We discussed about correct intake of thyroid hh, fasting, with water, separated by at least 30 minutes from breakfast, and separated by more than 4 hours from calcium, iron, multivitamins, acid reflux medications (PPIs). We discussed about positive and negative aspects of using Armour thyroid. I underlined the fact  that:  Armour is purified from porcine thyroid glands, which is not without risk for contaminants  Also, the ratio between T4 and T3 in Armour is physiologic for pigs, not for humans.  The short half life of T3 can cause fluctuations in blood levels, which can result in mood swings and heart rhythm abnormalities.  The concentration of the active substances (T4 and T3) can be expected to vary between different Armour lots, which can cause variation in the thyroid function tests.  - she would like to stay on Armour - we discussed about the health risks of a suppressed TSH (arrhythmias, mood swings, osteoporosis, hypercoagulability) - since she had labs checked recently, no need for recheck today >> will decrease Armour to 90 mg  - we'll check thyroid tests in 5 weeks: TSH, free T4, free T3 - since Thyroid U/S normal, her loss of voice is likely not related to thyroid, I wonder if she can benefit from seeing a voice specialist (?)  - I will see her back in 3 months  I noticed after she left that she is taking a high dose of vit A daily >> this can cause osteoporosis >> will need to discuss at next visit .

## 2013-04-11 NOTE — Patient Instructions (Signed)
Please return in 5 weeks for labs and in 3 months for a visit. Please decrease the Armour dose to 90 mg.

## 2013-04-13 ENCOUNTER — Encounter: Payer: Self-pay | Admitting: Family Medicine

## 2013-04-13 DIAGNOSIS — R739 Hyperglycemia, unspecified: Secondary | ICD-10-CM

## 2013-04-13 HISTORY — DX: Hyperglycemia, unspecified: R73.9

## 2013-04-13 NOTE — Assessment & Plan Note (Signed)
Minimize simple carbs. No changes HGB1C 6.1

## 2013-04-13 NOTE — Progress Notes (Signed)
Patient ID: [name], female   DOB: 05-10-1960, 52 y.o.   MRN: [id number] [name] [id number] [birth year] 04/13/2013      Progress Note-Follow Up  Subjective  Chief Complaint  Chief Complaint  Patient presents with  . Follow-up    5 week    HPI  This is a 52 year old female who is in today for followup. Her major complaint is of persistent heartburn and some hoarseness. She notes waking up with some substernal burning and some nausea at times. Releases she did have nausea and vomiting but that is resolved. No constipation or diarrhea. No fevers or chills. No palpitations or shortness of breath. No GU complaints. Taking medications as prescribed. No dysphagia.  Past Medical History  Diagnosis Date  . GERD (gastroesophageal reflux disease)   . Hypertension   . Thyroid disease     hypothyroidism  . Anxiety   . Depression   . Hyperlipidemia   . Chronic rhinosinusitis   . OSA (obstructive sleep apnea)   . Obesity   . Colon polyps   . Fibroid, uterine   . Hoarseness   . Glaucoma 12-13  . IBS (irritable bowel syndrome) 02/13/2011  . Perimenopause 10/05/2012  . Edema 01/06/2013  . Diabetes 01/06/2013  . Chronic pain syndrome 01/06/2013  . Cough 01/06/2013  . Hypokalemia 02/05/2013  . Low back pain 03/03/2013  . Hyperglycemia 04/13/2013    Past Surgical History  Procedure Laterality Date  . Cholecystectomy    . Abdominal hysterectomy      Family History  Problem Relation Age of Onset  . Alcohol abuse Father   . Cancer Father     renal and colon  . Hyperlipidemia Father   . Hypertension Father   . Diabetes    . Cancer Paternal Grandfather     colon    History   Social History  . Marital Status: Single    Spouse Name: N/A    Number of Children: N/A  . Years of Education: N/A   Occupational History  . Not on file.   Social History Main Topics  . Smoking status: Former Smoker -- 0.50 packs/day for 30 years    Types: Cigarettes    Quit date:  04/25/2003  . Smokeless tobacco: Never Used     Comment: smoked since age 76  . Alcohol Use: No  . Drug Use: Not on file  . Sexual Activity: Not on file   Other Topics Concern  . Not on file   Social History Narrative   Endo-- Dr Lucianne Muss   ENT--Dr shoemaker   GI--Dr Buccini   Pulm--Dr Clance   Rheum--Dr Kellie Simmering          Current Outpatient Prescriptions on File Prior to Visit  Medication Sig Dispense Refill  . ALPRAZolam (XANAX) 0.25 MG tablet Take 1 tablet (0.25 mg total) by mouth 2 (two) times daily as needed for sleep.  20 tablet  0  . b complex vitamins tablet Take 1 tablet by mouth daily.      . Beta Carotene (VITAMIN A) 25000 UNIT capsule Take 25,000 Units by mouth daily.      . cholecalciferol (VITAMIN D-400) 400 UNITS TABS Take 400 Units by mouth daily.      . clotrimazole (LOTRIMIN) 1 % cream Apply topically 2 (two) times daily.  60 g  1  . diphenhydrAMINE (BENADRYL) 25 MG tablet Take 25 mg by mouth at bedtime as needed.        Marland Kitchen estradiol (  ESTRACE) 1 MG tablet Take 1 mg by mouth daily.        . furosemide (LASIX) 20 MG tablet Take 1 tablet (20 mg total) by mouth daily as needed for fluid or edema.  30 tablet  3  . glucose blood (TRUE CARE TEST STRIP PACK) test strip Use as instructed  100 each  3  . HYDROcodone-homatropine (HYCODAN) 5-1.5 MG/5ML syrup Take 5 mLs by mouth 3 (three) times daily as needed for cough.  140 mL  0  . latanoprost (XALATAN) 0.005 % ophthalmic solution Place 1 drop into both eyes at bedtime.      Marland Kitchen losartan-hydrochlorothiazide (HYZAAR) 50-12.5 MG per tablet TAKE 1 TABLET BY MOUTH DAILY  90 tablet  1  . metFORMIN (GLUCOPHAGE-XR) 500 MG 24 hr tablet Take 500 mg by mouth daily with breakfast.      . metFORMIN (GLUCOPHAGE-XR) 500 MG 24 hr tablet TAKE 2 TABLETS BY MOUTH DAILY WITH BREAKFAST.  60 tablet  11  . nebivolol (BYSTOLIC) 5 MG tablet Take 1 tablet (5 mg total) by mouth daily. Failed Metoprolol  30 tablet  1  . nitrofurantoin,  macrocrystal-monohydrate, (MACROBID) 100 MG capsule Take 1 capsule (100 mg total) by mouth 2 (two) times daily.  6 capsule  0  . omeprazole-sodium bicarbonate (ZEGERID) 40-1100 MG per capsule Take 1 capsule by mouth daily.  30 capsule  4  . potassium chloride SA (KLOR-CON M20) 20 MEQ tablet Take 1 tablet (20 mEq total) by mouth daily.  90 tablet  3  . TRUEPLUS LANCETS 30G MISC 1 Device by Does not apply route daily as needed. DX 250.00 by Does not apply route daily as needed. DX 250.00  100 each  2  . venlafaxine XR (EFFEXOR-XR) 150 MG 24 hr capsule TAKE 1 CAPSULE BY MOUTH DAILY.  30 capsule  3   No current facility-administered medications on file prior to visit.    Allergies  Allergen Reactions  . Aspirin     REACTION: Upset stomach  . Amoxicillin Rash    Review of Systems  Review of Systems  Constitutional: Negative for fever and malaise/fatigue.  HENT: Positive for congestion.   Eyes: Negative for discharge.  Respiratory: Negative for shortness of breath.   Cardiovascular: Negative for chest pain, palpitations and leg swelling.  Gastrointestinal: Positive for heartburn. Negative for nausea, abdominal pain and diarrhea.  Genitourinary: Negative for dysuria.  Musculoskeletal: Negative for falls.  Skin: Negative for rash.  Neurological: Negative for loss of consciousness and headaches.  Endo/Heme/Allergies: Negative for polydipsia.  Psychiatric/Behavioral: Negative for depression and suicidal ideas. The patient is not nervous/anxious and does not have insomnia.     Objective  BP 112/70  Pulse 79  Temp(Src) 98.7 F (37.1 C) (Oral)  Ht 4\' 11"  (1.499 m)  Wt 236 lb 0.6 oz (107.067 kg)  BMI 47.65 kg/m2  SpO2 96%  Physical Exam  Physical Exam  Constitutional: She is oriented to person, place, and time and well-developed, well-nourished, and in no distress. No distress.  HENT:  Head: Normocephalic and atraumatic.  Eyes: Conjunctivae are normal.  Neck: Neck supple. No  thyromegaly present.  Cardiovascular: Normal rate, regular rhythm and normal heart sounds.   No murmur heard. Pulmonary/Chest: Effort normal and breath sounds normal. She has no wheezes.  Abdominal: She exhibits no distension and no mass.  Musculoskeletal: She exhibits no edema.  Lymphadenopathy:    She has no cervical adenopathy.  Neurological: She is alert and oriented to person, place, and time.  Skin: Skin is warm and dry. No rash noted. She is not diaphoretic.  Psychiatric: Memory, affect and judgment normal.    Lab Results  Component Value Date   TSH <0.008* 03/31/2013   Lab Results  Component Value Date   WBC 3.5* 03/31/2013   HGB 14.8 03/31/2013   HCT 43.5 03/31/2013   MCV 86.8 03/31/2013   PLT 330 03/31/2013   Lab Results  Component Value Date   CREATININE 0.86 03/31/2013   BUN 8 03/31/2013   NA 138 03/31/2013   K 4.2 03/31/2013   CL 102 03/31/2013   CO2 27 03/31/2013   Lab Results  Component Value Date   ALT 17 03/31/2013   AST 16 03/31/2013   ALKPHOS 69 03/31/2013   BILITOT 0.4 03/31/2013   Lab Results  Component Value Date   CHOL 200 03/31/2013   Lab Results  Component Value Date   HDL 49 03/31/2013   Lab Results  Component Value Date   LDLCALC 117* 03/31/2013   Lab Results  Component Value Date   TRIG 169* 03/31/2013   Lab Results  Component Value Date   CHOLHDL 4.1 03/31/2013     Assessment & Plan  HYPERTENSION Well controlled no changes  GERD Improved some with Zegerid, avoid offending foods, may use Tums or Zantac prn  ALLERGIC RHINITIS Encouraged daily antihistamines. Does c/o some hoarseness if no resolution encouraged to follow up with ENT.  HYPOTHYROIDISM Following with endocrinology. Is on Armour Thyroid but is over treated and will be adjusted by endocrine  HYPERLIPIDEMIA Mild, avoid trans fats, increase exercise, consider krill oil caps.  Hyperglycemia Minimize simple carbs. No changes HGB1C 6.1

## 2013-04-13 NOTE — Assessment & Plan Note (Signed)
Improved some with Zegerid, avoid offending foods, may use Tums or Zantac prn

## 2013-04-13 NOTE — Assessment & Plan Note (Signed)
Mild, avoid trans fats, increase exercise, consider krill oil caps.

## 2013-04-13 NOTE — Assessment & Plan Note (Signed)
Following with endocrinology. Is on Armour Thyroid but is over treated and will be adjusted by endocrine

## 2013-04-13 NOTE — Assessment & Plan Note (Signed)
Encouraged daily antihistamines. Does c/o some hoarseness if no resolution encouraged to follow up with ENT.

## 2013-04-13 NOTE — Assessment & Plan Note (Signed)
Well controlled no changes 

## 2013-04-14 ENCOUNTER — Telehealth: Payer: Self-pay | Admitting: *Deleted

## 2013-04-14 NOTE — Telephone Encounter (Signed)
Same dosage as the Armor thyroid?

## 2013-04-14 NOTE — Telephone Encounter (Signed)
Pt called stating that she found out how much Synthroid will cost and she said it does not seem to be that expensive. She stated she would like to try Synthroid (name brand, not generic). Please advise.

## 2013-04-14 NOTE — Telephone Encounter (Signed)
Sure! Carollee Herter, can you send this?

## 2013-04-16 ENCOUNTER — Other Ambulatory Visit: Payer: Self-pay | Admitting: *Deleted

## 2013-04-16 MED ORDER — LEVOTHYROXINE SODIUM 150 MCG PO TABS: 150.0000 ug | ORAL_TABLET | Freq: Every day | ORAL | Status: DC

## 2013-04-16 MED ORDER — SYNTHROID 150 MCG PO TABS: 150.0000 ug | ORAL_TABLET | Freq: Every day | ORAL | Status: DC

## 2013-04-16 NOTE — Telephone Encounter (Signed)
Rx for Synthroid 150 mcg sent to her pharmacy.

## 2013-05-05 ENCOUNTER — Ambulatory Visit (INDEPENDENT_AMBULATORY_CARE_PROVIDER_SITE_OTHER): Payer: 59 | Admitting: Family Medicine

## 2013-05-05 ENCOUNTER — Encounter: Payer: Self-pay | Admitting: Family Medicine

## 2013-05-05 VITALS — BP 132/82 | HR 83 | Temp 98.7°F | Ht 64.5 in | Wt 237.0 lb

## 2013-05-05 DIAGNOSIS — R490 Dysphonia: Secondary | ICD-10-CM

## 2013-05-05 DIAGNOSIS — K219 Gastro-esophageal reflux disease without esophagitis: Secondary | ICD-10-CM

## 2013-05-05 DIAGNOSIS — R609 Edema, unspecified: Secondary | ICD-10-CM

## 2013-05-05 DIAGNOSIS — E669 Obesity, unspecified: Secondary | ICD-10-CM

## 2013-05-05 DIAGNOSIS — I10 Essential (primary) hypertension: Secondary | ICD-10-CM

## 2013-05-05 DIAGNOSIS — E039 Hypothyroidism, unspecified: Secondary | ICD-10-CM

## 2013-05-05 MED ORDER — PHENTERMINE HCL 37.5 MG PO CAPS: 37.5000 mg | ORAL_CAPSULE | ORAL | Status: DC

## 2013-05-05 MED ORDER — SPIRONOLACTONE 25 MG PO TABS: 25.0000 mg | ORAL_TABLET | Freq: Two times a day (BID) | ORAL | Status: DC

## 2013-05-05 MED ORDER — LOSARTAN POTASSIUM 50 MG PO TABS: 50.0000 mg | ORAL_TABLET | Freq: Every day | ORAL | Status: DC

## 2013-05-05 NOTE — Progress Notes (Signed)
Pre visit review using our clinic review tool, if applicable. No additional management support is needed unless otherwise documented below in the visit note. 

## 2013-05-05 NOTE — Patient Instructions (Signed)
Til gone take Losartanhct once a day and SPironolactone once a day Then start Losartan 50 mg daily and Spironolactone twice a day and stop potassium  Basic Carbohydrate Counting Basic carbohydrate counting is a way to plan meals. It is done by counting the amount of carbohydrate in foods. Foods that have carbohydrates are starches (grains, beans, starchy vegetables) and sweets. Eating carbohydrates increases blood glucose (sugar) levels. People with diabetes use carbohydrate counting to help keep their blood glucose at a normal level.  COUNTING CARBOHYDRATES IN FOODS The first step in counting carbohydrates is to learn how many carbohydrate servings you should have in every meal. A dietitian can plan this for you. After learning the amount of carbohydrates to include in your meal plan, you can start to choose the carbohydrate-containing foods you want to eat.  There are 2 ways to identify the amount of carbohydrates in the foods you eat.  Read the Nutrition Facts panel on food labels. You need 2 pieces of information from the Nutrition Facts panel to count carbohydrates this way:  Serving size.  Total carbohydrate (in grams). Decide how many servings you will be eating. If it is 1 serving, you will be eating the amount of carbohydrate listed on the panel. If you will be eating 2 servings, you will be eating double the amount of carbohydrate listed on the panel.   Learn serving sizes. A serving size of most carbohydrate-containing foods is about 15 grams (g). Listed below are single serving sizes of common carbohydrate-containing foods:  1 slice bread.   cup unsweetened, dry cereal.   cup hot cereal.   cup rice.   cup mashed potatoes.   cup pasta.  1 cup fresh fruit.   cup canned fruit.  1 cup milk (whole, 2%, or skim).   cup starchy vegetables (peas, corn, or potatoes). Counting carbohydrates this way is similar to looking on the Nutrition Facts panel. Decide how many  servings you will eat first. Multiply the number of servings you eat by 15 g. For example, if you have 2 cups of strawberries, you had 2 servings. That means you had 30 g of carbohydrate (2 servings x 15 g = 30 g). CALCULATING CARBOHYDRATES IN A MEAL Sample dinner  3 oz chicken breast.   cup brown rice.   cup corn.  1 cup fat-free milk.  1 cup strawberries with sugar-free whipped topping. Carbohydrate calculation First, identify the foods that contain carbohydrate:  Rice.  Corn.  Milk.  Strawberries. Calculate the number of servings eaten:  2 servings rice.  1 serving corn.  1 serving milk.  1 serving strawberries. Multiply the number of servings by 15 g:  2 servings rice x 15 g = 30 g.  1 serving corn x 15 g = 15 g.  1 serving milk x 15 g = 15 g.  1 serving strawberries x 15 g = 15 g. Add the amounts to find the total carbohydrates eaten: 30 g + 15 g + 15 g + 15 g = 75 g carbohydrate eaten at dinner. Document Released: 04/10/2005 Document Revised: 07/03/2011 Document Reviewed: 02/24/2011 [facility] Patient Information 2014 [place], [place].

## 2013-05-06 ENCOUNTER — Telehealth: Payer: Self-pay | Admitting: Family Medicine

## 2013-05-06 NOTE — Telephone Encounter (Signed)
Relevant patient education assigned to patient using Emmi. ° °

## 2013-05-07 ENCOUNTER — Other Ambulatory Visit: Payer: Self-pay | Admitting: Family Medicine

## 2013-05-11 ENCOUNTER — Encounter: Payer: Self-pay | Admitting: Family Medicine

## 2013-05-11 DIAGNOSIS — E669 Obesity, unspecified: Secondary | ICD-10-CM | POA: Insufficient documentation

## 2013-05-11 DIAGNOSIS — R490 Dysphonia: Secondary | ICD-10-CM

## 2013-05-11 HISTORY — DX: Obesity, unspecified: E66.9

## 2013-05-11 HISTORY — DX: Dysphonia: R49.0

## 2013-05-11 NOTE — Assessment & Plan Note (Signed)
Well controlled no changes 

## 2013-05-11 NOTE — Assessment & Plan Note (Signed)
Already seen by ENT still struggling will refer to gastroenterology for further consideration, continue Ranitidine

## 2013-05-11 NOTE — Assessment & Plan Note (Signed)
Continue to maintain synthroid at 150 and reassess in 8-10 weeks

## 2013-05-11 NOTE — Progress Notes (Signed)
Patient ID: [name], female   DOB: 10-20-1960, 53 y.o.   MRN: [id number] [name] [id number] [birth year] 05/11/2013      Progress Note-Follow Up  Subjective  Chief Complaint  Chief Complaint  Patient presents with  . Follow-up    4 week f/up on phentermine    HPI  Patient is a 53 year old American female who is in today for followup. She continues to struggle with some low-grade dysphagia and persistent hoarseness. Has been seen by Dr. Wallis Mart of ENT and no new causes have been found. Hasn't had any recent illness. He is tolerating phentermine. Denies chest pain, palpitations or shortness of breath. Does complain of some mild edema. Is taking medications as prescribed. Denies GI or GU complaints today  Past Medical History  Diagnosis Date  . GERD (gastroesophageal reflux disease)   . Hypertension   . Thyroid disease     hypothyroidism  . Anxiety   . Depression   . Hyperlipidemia   . Chronic rhinosinusitis   . OSA (obstructive sleep apnea)   . Obesity   . Colon polyps   . Fibroid, uterine   . Hoarseness   . Glaucoma 12-13  . IBS (irritable bowel syndrome) 02/13/2011  . Perimenopause 10/05/2012  . Edema 01/06/2013  . Diabetes 01/06/2013  . Chronic pain syndrome 01/06/2013  . Cough 01/06/2013  . Hypokalemia 02/05/2013  . Low back pain 03/03/2013  . Hyperglycemia 04/13/2013  . Hoarseness of voice 05/11/2013  . Obesity, unspecified 05/11/2013    Past Surgical History  Procedure Laterality Date  . Cholecystectomy    . Abdominal hysterectomy      Family History  Problem Relation Age of Onset  . Alcohol abuse Father   . Cancer Father     renal and colon  . Hyperlipidemia Father   . Hypertension Father   . Diabetes    . Cancer Paternal Grandfather     colon    History   Social History  . Marital Status: Single    Spouse Name: N/A    Number of Children: N/A  . Years of Education: N/A   Occupational History  . Not on file.   Social History Main  Topics  . Smoking status: Former Smoker -- 0.50 packs/day for 30 years    Types: Cigarettes    Quit date: 04/25/2003  . Smokeless tobacco: Never Used     Comment: smoked since age 53  . Alcohol Use: No  . Drug Use: Not on file  . Sexual Activity: Not on file   Other Topics Concern  . Not on file   Social History Narrative   Endo-- Dr Dwyane Dee   ENT--Dr shoemaker   GI--Dr Buccini   Pulm--Dr Clance   Rheum--Dr Charlestine Night          Current Outpatient Prescriptions on File Prior to Visit  Medication Sig Dispense Refill  . ALPRAZolam (XANAX) 0.25 MG tablet Take 1 tablet (0.25 mg total) by mouth 2 (two) times daily as needed for sleep.  20 tablet  0  . b complex vitamins tablet Take 1 tablet by mouth daily.      . Beta Carotene (VITAMIN A) 25000 UNIT capsule Take 25,000 Units by mouth daily.      . cholecalciferol (VITAMIN D-400) 400 UNITS TABS Take 400 Units by mouth daily.      . clotrimazole (LOTRIMIN) 1 % cream Apply topically 2 (two) times daily.  60 g  1  . diphenhydrAMINE (BENADRYL) 25 MG tablet  Take 25 mg by mouth at bedtime as needed.        Marland Kitchen estradiol (ESTRACE) 1 MG tablet Take 1 mg by mouth daily.        . furosemide (LASIX) 20 MG tablet Take 1 tablet (20 mg total) by mouth daily as needed for fluid or edema.  30 tablet  3  . glucose blood (TRUE CARE TEST STRIP PACK) test strip Use as instructed  100 each  3  . latanoprost (XALATAN) 0.005 % ophthalmic solution Place 1 drop into both eyes at bedtime.      . metFORMIN (GLUCOPHAGE-XR) 500 MG 24 hr tablet Take 500 mg by mouth daily with breakfast.      . omeprazole-sodium bicarbonate (ZEGERID) 40-1100 MG per capsule Take 1 capsule by mouth daily.  30 capsule  4  . ranitidine (ZANTAC) 300 MG tablet Take 1 tablet (300 mg total) by mouth at bedtime.  30 tablet  5  . SYNTHROID 150 MCG tablet Take 1 tablet (150 mcg total) by mouth daily before breakfast.  90 tablet  1  . TRUEPLUS LANCETS 30G MISC 1 Device by Does not apply route daily as  needed. DX 250.00 by Does not apply route daily as needed. DX 250.00  100 each  2  . venlafaxine XR (EFFEXOR-XR) 150 MG 24 hr capsule TAKE 1 CAPSULE BY MOUTH DAILY.  30 capsule  3   No current facility-administered medications on file prior to visit.    Allergies  Allergen Reactions  . Aspirin     REACTION: Upset stomach  . Amoxicillin Rash    Review of Systems  Review of Systems  Constitutional: Negative for fever and malaise/fatigue.  HENT: Negative for congestion.   Eyes: Negative for discharge.  Respiratory: Negative for shortness of breath.   Cardiovascular: Negative for chest pain, palpitations and leg swelling.  Gastrointestinal: Negative for nausea, abdominal pain and diarrhea.  Genitourinary: Negative for dysuria.  Musculoskeletal: Negative for falls.  Skin: Negative for rash.  Neurological: Negative for loss of consciousness and headaches.  Endo/Heme/Allergies: Negative for polydipsia.  Psychiatric/Behavioral: Negative for depression and suicidal ideas. The patient is not nervous/anxious and does not have insomnia.     Objective  BP 132/82  Pulse 83  Temp(Src) 98.7 F (37.1 C) (Oral)  Ht 5' 4.5" (1.638 m)  Wt 237 lb (107.502 kg)  BMI 40.07 kg/m2  SpO2 94%  Physical Exam  Physical Exam  Constitutional: She is oriented to person, place, and time and well-developed, well-nourished, and in no distress. No distress.  HENT:  Head: Normocephalic and atraumatic.  Posterior oropharynx erythematous  Eyes: Conjunctivae are normal.  Neck: Neck supple. No thyromegaly present.  Cardiovascular: Normal rate, regular rhythm and normal heart sounds.   No murmur heard. Pulmonary/Chest: Effort normal and breath sounds normal. She has no wheezes.  Abdominal: She exhibits no distension and no mass.  Musculoskeletal: She exhibits no edema.  Lymphadenopathy:    She has no cervical adenopathy.  Neurological: She is alert and oriented to person, place, and time.  Skin: Skin  is warm and dry. No rash noted. She is not diaphoretic.  Psychiatric: Memory, affect and judgment normal.    Lab Results  Component Value Date   TSH <0.008* 03/31/2013   Lab Results  Component Value Date   WBC 3.5* 03/31/2013   HGB 14.8 03/31/2013   HCT 43.5 03/31/2013   MCV 86.8 03/31/2013   PLT 330 03/31/2013   Lab Results  Component Value Date  CREATININE 0.86 03/31/2013   BUN 8 03/31/2013   NA 138 03/31/2013   K 4.2 03/31/2013   CL 102 03/31/2013   CO2 27 03/31/2013   Lab Results  Component Value Date   ALT 17 03/31/2013   AST 16 03/31/2013   ALKPHOS 69 03/31/2013   BILITOT 0.4 03/31/2013   Lab Results  Component Value Date   CHOL 200 03/31/2013   Lab Results  Component Value Date   HDL 49 03/31/2013   Lab Results  Component Value Date   LDLCALC 117* 03/31/2013   Lab Results  Component Value Date   TRIG 169* 03/31/2013   Lab Results  Component Value Date   CHOLHDL 4.1 03/31/2013     Assessment & Plan  HYPERTENSION Well controlled no changes.  HYPOTHYROIDISM Continue to maintain synthroid at 150 and reassess in 8-10 weeks  Hoarseness of voice Already seen by ENT still struggling will refer to gastroenterology for further consideration, continue Ranitidine  Obesity, unspecified Tolerating Phentermine, encouraged DASH diet and regular exercise.   Edema Try Spironolactone bid

## 2013-05-11 NOTE — Assessment & Plan Note (Signed)
Try Spironolactone bid

## 2013-05-11 NOTE — Assessment & Plan Note (Signed)
Tolerating Phentermine, encouraged DASH diet and regular exercise.

## 2013-05-19 ENCOUNTER — Other Ambulatory Visit: Payer: Self-pay

## 2013-05-26 ENCOUNTER — Other Ambulatory Visit (INDEPENDENT_AMBULATORY_CARE_PROVIDER_SITE_OTHER): Payer: 59

## 2013-05-26 ENCOUNTER — Other Ambulatory Visit: Payer: Self-pay

## 2013-05-26 DIAGNOSIS — E039 Hypothyroidism, unspecified: Secondary | ICD-10-CM

## 2013-05-26 LAB — T4, FREE: Free T4: 1.27 ng/dL (ref 0.60–1.60)

## 2013-05-26 LAB — T3, FREE: T3, Free: 3.4 pg/mL (ref 2.3–4.2)

## 2013-05-26 LAB — TSH: TSH: 0.06 u[IU]/mL — ABNORMAL LOW (ref 0.35–5.50)

## 2013-05-27 ENCOUNTER — Telehealth: Payer: Self-pay | Admitting: *Deleted

## 2013-05-27 ENCOUNTER — Encounter: Payer: Self-pay | Admitting: Family Medicine

## 2013-05-27 ENCOUNTER — Other Ambulatory Visit: Payer: Self-pay | Admitting: Internal Medicine

## 2013-05-27 ENCOUNTER — Encounter: Payer: Self-pay | Admitting: Internal Medicine

## 2013-05-27 ENCOUNTER — Other Ambulatory Visit: Payer: Self-pay | Admitting: Family Medicine

## 2013-05-27 DIAGNOSIS — E039 Hypothyroidism, unspecified: Secondary | ICD-10-CM

## 2013-05-27 DIAGNOSIS — E669 Obesity, unspecified: Secondary | ICD-10-CM

## 2013-05-27 MED ORDER — SYNTHROID 150 MCG PO TABS: 150.0000 ug | ORAL_TABLET | Freq: Every day | ORAL | Status: DC

## 2013-05-27 MED ORDER — ARMOUR THYROID 90 MG PO TABS: 90.0000 mg | ORAL_TABLET | Freq: Every day | ORAL | Status: DC

## 2013-05-27 MED ORDER — PHENTERMINE HCL 37.5 MG PO CAPS: 37.5000 mg | ORAL_CAPSULE | ORAL | Status: DC

## 2013-05-27 NOTE — Telephone Encounter (Signed)
Yes, continue Synthroid - sorry for the mistake - I sent her a message through [place].

## 2013-05-27 NOTE — Telephone Encounter (Signed)
Pt called stating that she is not on Armour thyroid. Pt is taking Synthroid (brand name) 150 mcg. Do you want her to continue on Synthroid or go back on Armour. Please advise.

## 2013-05-27 NOTE — Telephone Encounter (Signed)
   Last RX was wrote on 05-05-13 quantity 30 with 0 refills  I will deny do to be to early

## 2013-05-28 NOTE — Telephone Encounter (Signed)
Called [name] and lvm advising her that Dr Cruzita Lederer said, sorry for the mistake. She wants her to continue on the Synthroid. She sent a message to her via [place]. Advised if she had any further problems or questions to call our office.

## 2013-06-05 ENCOUNTER — Other Ambulatory Visit: Payer: Self-pay | Admitting: Family Medicine

## 2013-06-16 ENCOUNTER — Encounter: Payer: Self-pay | Admitting: Family Medicine

## 2013-06-16 ENCOUNTER — Ambulatory Visit (INDEPENDENT_AMBULATORY_CARE_PROVIDER_SITE_OTHER): Payer: 59 | Admitting: Family Medicine

## 2013-06-16 VITALS — BP 122/82 | HR 85 | Temp 98.0°F | Ht 64.5 in | Wt 228.1 lb

## 2013-06-16 DIAGNOSIS — E669 Obesity, unspecified: Secondary | ICD-10-CM

## 2013-06-16 DIAGNOSIS — K219 Gastro-esophageal reflux disease without esophagitis: Secondary | ICD-10-CM

## 2013-06-16 DIAGNOSIS — I7300 Raynaud's syndrome without gangrene: Secondary | ICD-10-CM

## 2013-06-16 DIAGNOSIS — I10 Essential (primary) hypertension: Secondary | ICD-10-CM

## 2013-06-16 DIAGNOSIS — I739 Peripheral vascular disease, unspecified: Secondary | ICD-10-CM

## 2013-06-16 DIAGNOSIS — E039 Hypothyroidism, unspecified: Secondary | ICD-10-CM

## 2013-06-16 HISTORY — DX: Raynaud's syndrome without gangrene: I73.00

## 2013-06-16 MED ORDER — PHENTERMINE HCL 37.5 MG PO CAPS: 37.5000 mg | ORAL_CAPSULE | ORAL | Status: DC

## 2013-06-16 MED ORDER — ASPIRIN EC 81 MG PO TBEC: 81.0000 mg | DELAYED_RELEASE_TABLET | Freq: Every day | ORAL | Status: DC

## 2013-06-16 NOTE — Progress Notes (Signed)
Pre visit review using our clinic review tool, if applicable. No additional management support is needed unless otherwise documented below in the visit note. 

## 2013-06-16 NOTE — Assessment & Plan Note (Signed)
Following closely with endocrinology at this time. Has had Levothyroxine increased. No concerning symptoms but still struggles with fatigue.

## 2013-06-16 NOTE — Assessment & Plan Note (Signed)
Has shown weight loss on Phentermine, may continue, encouraged DASH diet and exercise as tolerated. No changes

## 2013-06-16 NOTE — Progress Notes (Signed)
Patient ID: [name], female   DOB: 04/08/1961, 53 y.o.   MRN: [id number] [name] [id number] 08/15/1960 06/16/2013      Progress Note-Follow Up  Subjective  Chief Complaint  Chief Complaint  Patient presents with  . Follow-up    1 month    HPI  Patient is a 53 year old American female who is in today for routine followup. She is pleased with her weight loss on the phentermine and denies any increasing headaches, chest pain, palpitation or GI complaints. She is following closely with her gastroenterologist and they have just added to excellent to her Zegerid and stopped her ranitidine. Thus far she has not noted any significant change. Hoarseness persists but has not worsened. No dysphagia. Does have some cough and congestion as well. Denies chest pain or palpitations. Is complaining of pain in her left third toe. Note she has cold fingers and toes regularly. At the third toe has been worse. No trauma. Otherwise no swelling or pain in her feet.  Past Medical History  Diagnosis Date  . GERD (gastroesophageal reflux disease)   . Hypertension   . Thyroid disease     hypothyroidism  . Anxiety   . Depression   . Hyperlipidemia   . Chronic rhinosinusitis   . OSA (obstructive sleep apnea)   . Obesity   . Colon polyps   . Fibroid, uterine   . Hoarseness   . Glaucoma 12-13  . IBS (irritable bowel syndrome) 02/13/2011  . Perimenopause 10/05/2012  . Edema 01/06/2013  . Diabetes 01/06/2013  . Chronic pain syndrome 01/06/2013  . Cough 01/06/2013  . Hypokalemia 02/05/2013  . Low back pain 03/03/2013  . Hyperglycemia 04/13/2013  . Hoarseness of voice 05/11/2013  . Obesity, unspecified 05/11/2013    Past Surgical History  Procedure Laterality Date  . Cholecystectomy    . Abdominal hysterectomy      Family History  Problem Relation Age of Onset  . Alcohol abuse Father   . Cancer Father     renal and colon  . Hyperlipidemia Father   . Hypertension Father   . Diabetes     . Cancer Paternal Grandfather     colon    History   Social History  . Marital Status: Single    Spouse Name: N/A    Number of Children: N/A  . Years of Education: N/A   Occupational History  . Not on file.   Social History Main Topics  . Smoking status: Former Smoker -- 0.50 packs/day for 30 years    Types: Cigarettes    Quit date: 04/25/2003  . Smokeless tobacco: Never Used     Comment: smoked since age 77  . Alcohol Use: No  . Drug Use: Not on file  . Sexual Activity: Not on file   Other Topics Concern  . Not on file   Social History Narrative   Endo-- Dr Dwyane Dee   ENT--Dr shoemaker   GI--Dr Buccini   Pulm--Dr Clance   Rheum--Dr Charlestine Night          Current Outpatient Prescriptions on File Prior to Visit  Medication Sig Dispense Refill  . ALPRAZolam (XANAX) 0.25 MG tablet Take 1 tablet (0.25 mg total) by mouth 2 (two) times daily as needed for sleep.  20 tablet  0  . b complex vitamins tablet Take 1 tablet by mouth daily.      . Beta Carotene (VITAMIN A) 25000 UNIT capsule Take 25,000 Units by mouth daily.      Marland Kitchen  BYSTOLIC 5 MG tablet TAKE 1 TABLET BY MOUTH DAILY  30 tablet  3  . cholecalciferol (VITAMIN D-400) 400 UNITS TABS Take 400 Units by mouth daily.      . clotrimazole (LOTRIMIN) 1 % cream Apply topically 2 (two) times daily.  60 g  1  . diphenhydrAMINE (BENADRYL) 25 MG tablet Take 25 mg by mouth at bedtime as needed.        Marland Kitchen estradiol (ESTRACE) 1 MG tablet Take 1 mg by mouth daily.        . furosemide (LASIX) 20 MG tablet Take 1 tablet (20 mg total) by mouth daily as needed for fluid or edema.  30 tablet  3  . glucose blood (TRUE CARE TEST STRIP PACK) test strip Use as instructed  100 each  3  . latanoprost (XALATAN) 0.005 % ophthalmic solution Place 1 drop into both eyes at bedtime.      Marland Kitchen losartan (COZAAR) 50 MG tablet Take 1 tablet (50 mg total) by mouth daily.  90 tablet  3  . metFORMIN (GLUCOPHAGE-XR) 500 MG 24 hr tablet Take 500 mg by mouth daily with  breakfast.      . omeprazole-sodium bicarbonate (ZEGERID) 40-1100 MG per capsule Take 1 capsule by mouth daily.  30 capsule  4  . spironolactone (ALDACTONE) 25 MG tablet Take 1 tablet (25 mg total) by mouth 2 (two) times daily.  60 tablet  1  . SYNTHROID 150 MCG tablet Take 1 tablet (150 mcg total) by mouth daily before breakfast.  60 tablet  2  . TRUEPLUS LANCETS 30G MISC 1 Device by Does not apply route daily as needed. DX 250.00 by Does not apply route daily as needed. DX 250.00  100 each  2  . venlafaxine XR (EFFEXOR-XR) 150 MG 24 hr capsule TAKE 1 CAPSULE BY MOUTH DAILY.  30 capsule  3   No current facility-administered medications on file prior to visit.    Allergies  Allergen Reactions  . Aspirin     REACTION: Upset stomach  . Amoxicillin Rash    Review of Systems  Review of Systems  Constitutional: Positive for malaise/fatigue. Negative for fever.  HENT: Positive for congestion. Negative for sore throat.   Eyes: Negative for discharge.  Respiratory: Negative for sputum production and shortness of breath.   Cardiovascular: Negative for chest pain, palpitations and leg swelling.  Gastrointestinal: Positive for heartburn. Negative for nausea, abdominal pain and diarrhea.  Genitourinary: Negative for dysuria.  Musculoskeletal: Positive for joint pain. Negative for falls.  Skin: Negative for rash.  Neurological: Negative for loss of consciousness and headaches.  Endo/Heme/Allergies: Negative for polydipsia.  Psychiatric/Behavioral: Positive for depression. Negative for suicidal ideas. The patient is nervous/anxious. The patient does not have insomnia.     Objective  BP 122/82  Pulse 85  Temp(Src) 98 F (36.7 C) (Oral)  Ht 5' 4.5" (1.638 m)  Wt 228 lb 1.9 oz (103.475 kg)  BMI 38.57 kg/m2  SpO2 96%  Physical Exam  Physical Exam  Constitutional: She is oriented to person, place, and time and well-developed, well-nourished, and in no distress. No distress.  HENT:   Head: Normocephalic and atraumatic.  Eyes: Conjunctivae are normal.  Neck: Neck supple. No thyromegaly present.  Cardiovascular: Normal rate, regular rhythm and normal heart sounds.   No murmur heard. Pulmonary/Chest: Effort normal and breath sounds normal. She has no wheezes.  Abdominal: She exhibits no distension and no mass.  Musculoskeletal: She exhibits no edema.  dp and pt  pulses 2 + b/l, skin feet warm, dry intact but toes are cold, 3rd toe on left is red  Lymphadenopathy:    She has no cervical adenopathy.  Neurological: She is alert and oriented to person, place, and time.  Skin: Skin is warm and dry. No rash noted. She is not diaphoretic.  Psychiatric: Memory, affect and judgment normal.    Lab Results  Component Value Date   TSH 0.06* 05/26/2013   Lab Results  Component Value Date   WBC 3.5* 03/31/2013   HGB 14.8 03/31/2013   HCT 43.5 03/31/2013   MCV 86.8 03/31/2013   PLT 330 03/31/2013   Lab Results  Component Value Date   CREATININE 0.86 03/31/2013   BUN 8 03/31/2013   NA 138 03/31/2013   K 4.2 03/31/2013   CL 102 03/31/2013   CO2 27 03/31/2013   Lab Results  Component Value Date   ALT 17 03/31/2013   AST 16 03/31/2013   ALKPHOS 69 03/31/2013   BILITOT 0.4 03/31/2013   Lab Results  Component Value Date   CHOL 200 03/31/2013   Lab Results  Component Value Date   HDL 49 03/31/2013   Lab Results  Component Value Date   LDLCALC 117* 03/31/2013   Lab Results  Component Value Date   TRIG 169* 03/31/2013   Lab Results  Component Value Date   CHOLHDL 4.1 03/31/2013     Assessment & Plan  Obesity, unspecified Has shown weight loss on Phentermine, may continue, encouraged DASH diet and exercise as tolerated. No changes  HYPERTENSION Well controlled no changes  HYPOTHYROIDISM Following closely with endocrinology at this time. Has had Levothyroxine increased. No concerning symptoms but still struggles with fatigue.  GERD Her gastroenterologist has just  added Dexilant to her Zegerid and stopped her Ranitidine. No changes today  But encouraged probiotics daily and avoid offending foods

## 2013-06-16 NOTE — Patient Instructions (Signed)

## 2013-06-16 NOTE — Assessment & Plan Note (Signed)
Her gastroenterologist has just added Dexilant to her Zegerid and stopped her Ranitidine. No changes today  But encouraged probiotics daily and avoid offending foods

## 2013-06-16 NOTE — Assessment & Plan Note (Signed)
All toes cold, 3rd left toe mildly erythematous. Start an 81 mg aspirin dialy with food and stay warm, report worsening symptoms

## 2013-06-16 NOTE — Assessment & Plan Note (Signed)
Well controlled no changes 

## 2013-07-01 ENCOUNTER — Ambulatory Visit: Payer: Self-pay | Admitting: *Deleted

## 2013-07-04 ENCOUNTER — Other Ambulatory Visit: Payer: Self-pay | Admitting: Family Medicine

## 2013-07-07 ENCOUNTER — Encounter: Payer: Self-pay | Admitting: Internal Medicine

## 2013-07-11 LAB — T3, FREE: T3, Free: 3 pg/mL (ref 2.3–4.2)

## 2013-07-11 LAB — T4, FREE: Free T4: 1.44 ng/dL (ref 0.80–1.80)

## 2013-07-11 LAB — TSH: TSH: 0.062 u[IU]/mL — ABNORMAL LOW (ref 0.350–4.500)

## 2013-07-14 ENCOUNTER — Ambulatory Visit (INDEPENDENT_AMBULATORY_CARE_PROVIDER_SITE_OTHER): Payer: 59 | Admitting: Internal Medicine

## 2013-07-14 ENCOUNTER — Encounter: Payer: Self-pay | Admitting: Internal Medicine

## 2013-07-14 VITALS — BP 118/66 | HR 105 | Temp 98.7°F | Resp 12 | Wt 227.0 lb

## 2013-07-14 DIAGNOSIS — E039 Hypothyroidism, unspecified: Secondary | ICD-10-CM

## 2013-07-14 MED ORDER — SYNTHROID 125 MCG PO TABS: 125.0000 ug | ORAL_TABLET | Freq: Every day | ORAL | Status: DC

## 2013-07-14 NOTE — Patient Instructions (Signed)
Please start Synthroid 125 mcg daily and return in 6 weeks for lbs. Please return in 6 months.

## 2013-07-14 NOTE — Progress Notes (Signed)
Patient ID: [name], female   DOB: [birth year], 53 y.o.   MRN: [id number]   HPI  [name] is a 53 y.o.-year-old female, returning for f/u for hypothyroidism.  Reviewed hx: Pt. has been dx with hypothyroidism in 2007 (TSH 59; CK then 2471); started on Synthroid, then switched to Armour by Dr Dwyane Dee; then back on Levothyroxine (but sluggish and tired) so in 09/2012 switched to Armour 90 initially, then increased to 120 mg in 12/2012 >> she finally felt well on this dose.  She takes the thyroid hh: - fasting - with water - separated by >30 min from b'fast  - separated by >4h from PPIs (takes at night), no multivitamins   At last visit, I suggested she retries Synthroid >> started on 150 mcg. Her TFTs were abnormal 3 days ago, with a TSH on 0.062.   I reviewed pt's thyroid tests: Lab Results  Component Value Date   TSH 0.062* 07/10/2013   TSH 0.06* 05/26/2013   TSH <0.008* 03/31/2013   TSH 0.11* 01/14/2013   TSH 0.35 12/18/2012   TSH 0.011* 10/01/2012   TSH 0.26* 07/25/2012   TSH 1.23 12/26/2011   TSH 0.58 05/01/2011   TSH 0.71 02/06/2011   FREET4 1.44 07/10/2013   FREET4 1.27 05/26/2013   FREET4 1.02 03/31/2013   FREET4 0.63 01/14/2013   FREET4 0.74 12/18/2012   FREET4 1.20 10/01/2012   FREET4 1.37 07/30/2012   FREET4 0.66 12/26/2011   FREET4 0.63 05/01/2011   FREET4 0.59* 02/06/2011    Pt denies feeling nodules in neck, + hoarseness (lost her voice this summer), dysphagia/odynophagia, SOB with lying down.  Pt describes: - no cold intolerance, but hands and feet cold; + feels hot, and sweats - + weight gain ~ 60 lbs since dx of hypothyroidism, now lost 10 lbs in last 2 mo - + fatigue - no N/V/D/C/reflux - no dry skin - no hair falling  I reviewed her chart and she also has a history of TAH in 2001, BSO in 2005.   ROS: Constitutional: see HPI Eyes: no blurry vision, no xerophthalmia ENT: no sore throat, no nodules palpated in throat, no dysphagia/odynophagia, + hoarseness, +  tinnitus, + hypoacusis Cardiovascular: no CP/SOB/palpitations/leg swelling Respiratory: no cough/SOB Gastrointestinal: no N/V/D/C Musculoskeletal: no muscle/joint aches Skin: no rashes, + hair loss Neurological: no tremors/numbness/tingling/dizziness  I reviewed pt's medications, allergies, PMH, social hx, family hx and no changes required, except as mentioned above.  PE: BP 118/66  Pulse 105  Temp(Src) 98.7 F (37.1 C) (Oral)  Resp 12  Wt 227 lb (102.967 kg)  SpO2 95% Wt Readings from Last 3 Encounters:  07/14/13 227 lb (102.967 kg)  06/16/13 228 lb 1.9 oz (103.475 kg)  05/05/13 237 lb (107.502 kg)   Constitutional: obese, lost her voice >> talks softly, in NAD Eyes: PERRLA, EOMI, no exophthalmos ENT: moist mucous membranes, no thyromegaly, no cervical lymphadenopathy Cardiovascular: RRR, No MRG Respiratory: CTA B Gastrointestinal: abdomen soft, NT, ND, BS+ Musculoskeletal: no deformities, strength intact in all 4 Skin: moist, warm, no rashes, but severe acanthosis nigricans - neck, face, hands Neurological: no tremor with outstretched hands, DTR normal in all 4  ASSESSMENT: 1. Hypothyroidism - Thyroid U/S normal 01/2013  PLAN:  1. Patient with long-standing hypothyroidism, on Synthroid therapy, with still low TSH. - She does not appear to have a goiter, thyroid nodules, or neck compression symptoms - We discussed about correct intake of thyroid hh, fasting, with water, separated by at least 30  minutes from breakfast, and separated by more than 4 hours from calcium, iron, multivitamins, acid reflux medications (PPIs). - recent thyroid tests show overreplacement >> will decrease the dose from 150 mcg to 125 mcg daily.  - I wonder if she can benefit from seeing a voice specialist (?)  - I will see her back in 6 months

## 2013-07-25 ENCOUNTER — Encounter: Payer: Self-pay | Admitting: Family Medicine

## 2013-08-04 ENCOUNTER — Telehealth: Payer: Self-pay

## 2013-08-04 DIAGNOSIS — E559 Vitamin D deficiency, unspecified: Secondary | ICD-10-CM

## 2013-08-04 NOTE — Telephone Encounter (Signed)
Message copied by Varney Daily on Mon Aug 04, 2013  2:10 PM ------      Message from: Penni Homans A      Created: Mon Jul 28, 2013 12:47 PM       Please add a vitamin D check to her next set of labs for a low vitamin d level ------

## 2013-08-06 NOTE — Telephone Encounter (Signed)
Lab order

## 2013-08-06 NOTE — Telephone Encounter (Signed)
laborder

## 2013-08-22 ENCOUNTER — Other Ambulatory Visit (INDEPENDENT_AMBULATORY_CARE_PROVIDER_SITE_OTHER): Payer: 59

## 2013-08-22 ENCOUNTER — Other Ambulatory Visit: Payer: Self-pay | Admitting: Internal Medicine

## 2013-08-22 DIAGNOSIS — E039 Hypothyroidism, unspecified: Secondary | ICD-10-CM

## 2013-08-22 LAB — TSH: TSH: 0.18 u[IU]/mL — ABNORMAL LOW (ref 0.35–5.50)

## 2013-08-22 LAB — T4, FREE: Free T4: 0.82 ng/dL (ref 0.60–1.60)

## 2013-08-22 MED ORDER — SYNTHROID 112 MCG PO TABS
112.0000 ug | ORAL_TABLET | Freq: Every day | ORAL | Status: DC
Start: 2013-08-22 — End: 2013-10-22

## 2013-08-29 ENCOUNTER — Encounter: Payer: Self-pay | Admitting: Family Medicine

## 2013-08-31 ENCOUNTER — Encounter: Payer: Self-pay | Admitting: Family Medicine

## 2013-09-05 ENCOUNTER — Other Ambulatory Visit: Payer: Self-pay | Admitting: Family Medicine

## 2013-09-05 ENCOUNTER — Other Ambulatory Visit: Payer: Self-pay | Admitting: Internal Medicine

## 2013-09-05 DIAGNOSIS — I739 Peripheral vascular disease, unspecified: Secondary | ICD-10-CM

## 2013-09-05 MED ORDER — ASPIRIN EC 81 MG PO TBEC: 81.0000 mg | DELAYED_RELEASE_TABLET | Freq: Every day | ORAL | Status: DC

## 2013-09-05 MED ORDER — METFORMIN HCL ER 500 MG PO TB24: 500.0000 mg | ORAL_TABLET | Freq: Every day | ORAL | Status: DC

## 2013-09-16 ENCOUNTER — Encounter: Payer: Self-pay | Admitting: Family Medicine

## 2013-09-16 ENCOUNTER — Ambulatory Visit (INDEPENDENT_AMBULATORY_CARE_PROVIDER_SITE_OTHER): Payer: 59 | Admitting: Family Medicine

## 2013-09-16 VITALS — BP 112/72 | HR 101 | Temp 98.1°F | Ht 64.5 in | Wt 235.1 lb

## 2013-09-16 DIAGNOSIS — E119 Type 2 diabetes mellitus without complications: Secondary | ICD-10-CM

## 2013-09-16 DIAGNOSIS — E669 Obesity, unspecified: Secondary | ICD-10-CM

## 2013-09-16 DIAGNOSIS — E039 Hypothyroidism, unspecified: Secondary | ICD-10-CM

## 2013-09-16 DIAGNOSIS — I10 Essential (primary) hypertension: Secondary | ICD-10-CM

## 2013-09-16 DIAGNOSIS — E785 Hyperlipidemia, unspecified: Secondary | ICD-10-CM

## 2013-09-16 DIAGNOSIS — K219 Gastro-esophageal reflux disease without esophagitis: Secondary | ICD-10-CM

## 2013-09-16 NOTE — Patient Instructions (Signed)
[place] Astringent Fiber, Benefiber daily Probiotics daily  Call GI for evaluation    Constipation, Adult Constipation is when a person has fewer than 3 bowel movements a week; has difficulty having a bowel movement; or has stools that are dry, hard, or larger than normal. As people grow older, constipation is more common. If you try to fix constipation with medicines that make you have a bowel movement (laxatives), the problem may get worse. Long-term laxative use may cause the muscles of the colon to become weak. A low-fiber diet, not taking in enough fluids, and taking certain medicines may make constipation worse. CAUSES   Certain medicines, such as antidepressants, pain medicine, iron supplements, antacids, and water pills.   Certain diseases, such as diabetes, irritable bowel syndrome (IBS), thyroid disease, or depression.   Not drinking enough water.   Not eating enough fiber-rich foods.   Stress or travel.  Lack of physical activity or exercise.  Not going to the restroom when there is the urge to have a bowel movement.  Ignoring the urge to have a bowel movement.  Using laxatives too much. SYMPTOMS   Having fewer than 3 bowel movements a week.   Straining to have a bowel movement.   Having hard, dry, or larger than normal stools.   Feeling full or bloated.   Pain in the lower abdomen.  Not feeling relief after having a bowel movement. DIAGNOSIS  Your caregiver will take a medical history and perform a physical exam. Further testing may be done for severe constipation. Some tests may include:   A barium enema X-ray to examine your rectum, colon, and sometimes, your small intestine.  A sigmoidoscopy to examine your lower colon.  A colonoscopy to examine your entire colon. TREATMENT  Treatment will depend on the severity of your constipation and what is causing it. Some dietary treatments include drinking more fluids and eating more fiber-rich  foods. Lifestyle treatments may include regular exercise. If these diet and lifestyle recommendations do not help, your caregiver may recommend taking over-the-counter laxative medicines to help you have bowel movements. Prescription medicines may be prescribed if over-the-counter medicines do not work.  HOME CARE INSTRUCTIONS   Increase dietary fiber in your diet, such as fruits, vegetables, whole grains, and beans. Limit high-fat and processed sugars in your diet, such as [place] fries, hamburgers, cookies, candies, and soda.   A fiber supplement may be added to your diet if you cannot get enough fiber from foods.   Drink enough fluids to keep your urine clear or pale yellow.   Exercise regularly or as directed by your caregiver.   Go to the restroom when you have the urge to go. Do not hold it.  Only take medicines as directed by your caregiver. Do not take other medicines for constipation without talking to your caregiver first. [place] IF:   You have bright red blood in your stool.   Your constipation lasts for more than 4 days or gets worse.   You have abdominal or rectal pain.   You have thin, pencil-like stools.  You have unexplained weight loss. MAKE SURE YOU:   Understand these instructions.  Will watch your condition.  Will get help right away if you are not doing well or get worse. Document Released: 01/07/2004 Document Revised: 07/03/2011 Document Reviewed: 01/20/2013 [facility] Patient Information 2014 [place], [place].

## 2013-09-16 NOTE — Progress Notes (Signed)
Pre visit review using our clinic review tool, if applicable. No additional management support is needed unless otherwise documented below in the visit note. 

## 2013-09-21 ENCOUNTER — Encounter: Payer: Self-pay | Admitting: Family Medicine

## 2013-09-21 NOTE — Progress Notes (Signed)
Patient ID: [name], female   DOB: 04/12/1961, 53 y.o.   MRN: [id number] LESHIA KOPE [id number] 25-Feb-1961 09/21/2013      Progress Note-Follow Up  Subjective  Chief Complaint  Chief Complaint  Patient presents with  . Follow-up    3 month    HPI  Patient is a 53 year old female in today for routine medical care. She is in today for followup. She feels better than at her last visit. Her heartburn is somewhat improved as is her voice. She has been having some trouble with vaginal burning and itching this last week. No fevers or chills. No abdominal or back pain. Denies CP/palp/SOB/HA/congestion/fevers/GI or GU c/o. Taking meds as prescribed  Past Medical History  Diagnosis Date  . GERD (gastroesophageal reflux disease)   . Hypertension   . Thyroid disease     hypothyroidism  . Anxiety   . Depression   . Hyperlipidemia   . Chronic rhinosinusitis   . OSA (obstructive sleep apnea)   . Obesity   . Colon polyps   . Fibroid, uterine   . Hoarseness   . Glaucoma 12-13  . IBS (irritable bowel syndrome) 02/13/2011  . Perimenopause 10/05/2012  . Edema 01/06/2013  . Diabetes 01/06/2013  . Chronic pain syndrome 01/06/2013  . Cough 01/06/2013  . Hypokalemia 02/05/2013  . Low back pain 03/03/2013  . Hyperglycemia 04/13/2013  . Hoarseness of voice 05/11/2013  . Obesity, unspecified 05/11/2013  . Raynaud disease 06/16/2013    Past Surgical History  Procedure Laterality Date  . Cholecystectomy    . Abdominal hysterectomy      Family History  Problem Relation Age of Onset  . Alcohol abuse Father   . Cancer Father     renal and colon  . Hyperlipidemia Father   . Hypertension Father   . Diabetes    . Cancer Paternal Grandfather     colon    History   Social History  . Marital Status: Single    Spouse Name: N/A    Number of Children: N/A  . Years of Education: N/A   Occupational History  . Not on file.   Social History Main Topics  . Smoking status: Former  Smoker -- 0.50 packs/day for 30 years    Types: Cigarettes    Quit date: 04/25/2003  . Smokeless tobacco: Never Used     Comment: smoked since age 46  . Alcohol Use: No  . Drug Use: Not on file  . Sexual Activity: Not on file   Other Topics Concern  . Not on file   Social History Narrative   Endo-- Dr Dwyane Dee   ENT--Dr shoemaker   GI--Dr Buccini   Pulm--Dr Clance   Rheum--Dr Charlestine Night          Current Outpatient Prescriptions on File Prior to Visit  Medication Sig Dispense Refill  . ALPRAZolam (XANAX) 0.25 MG tablet Take 1 tablet (0.25 mg total) by mouth 2 (two) times daily as needed for sleep.  20 tablet  0  . aspirin EC 81 MG tablet Take 1 tablet (81 mg total) by mouth daily. Aspirin only causes GI upset  30 tablet  3  . BYSTOLIC 5 MG tablet TAKE 1 TABLET BY MOUTH DAILY  30 tablet  3  . cholecalciferol (VITAMIN D-400) 400 UNITS TABS Take 400 Units by mouth daily.      . clotrimazole (LOTRIMIN) 1 % cream Apply topically 2 (two) times daily.  60 g  1  .  dexlansoprazole (DEXILANT) 60 MG capsule Take 1 capsule (60 mg total) by mouth daily.  30 capsule    . diphenhydrAMINE (BENADRYL) 25 MG tablet Take 25 mg by mouth at bedtime as needed.        Marland Kitchen estradiol (ESTRACE) 1 MG tablet Take 1 mg by mouth daily.        . furosemide (LASIX) 20 MG tablet Take 1 tablet (20 mg total) by mouth daily as needed for fluid or edema.  30 tablet  3  . glucose blood (TRUE CARE TEST STRIP PACK) test strip Use as instructed  100 each  3  . latanoprost (XALATAN) 0.005 % ophthalmic solution Place 1 drop into both eyes at bedtime.      Marland Kitchen losartan (COZAAR) 50 MG tablet Take 1 tablet (50 mg total) by mouth daily.  90 tablet  3  . metFORMIN (GLUCOPHAGE-XR) 500 MG 24 hr tablet Take 1 tablet (500 mg total) by mouth daily with breakfast.  30 tablet  3  . omeprazole-sodium bicarbonate (ZEGERID) 40-1100 MG per capsule Take 1 capsule by mouth daily.  30 capsule  4  . phentermine 37.5 MG capsule Take 1 capsule (37.5 mg  total) by mouth every morning.  30 capsule  2  . spironolactone (ALDACTONE) 25 MG tablet TAKE 1 TABLET BY MOUTH 2 TIMES DAILY  60 tablet  1  . SYNTHROID 112 MCG tablet Take 1 tablet (112 mcg total) by mouth daily before breakfast.  60 tablet  2  . TRUEPLUS LANCETS 30G MISC 1 Device by Does not apply route daily as needed. DX 250.00 by Does not apply route daily as needed. DX 250.00  100 each  2  . venlafaxine XR (EFFEXOR-XR) 150 MG 24 hr capsule TAKE 1 CAPSULE BY MOUTH DAILY.  30 capsule  3   No current facility-administered medications on file prior to visit.    Allergies  Allergen Reactions  . Aspirin     REACTION: Upset stomach  . Amoxicillin Rash    Review of Systems  Review of Systems  Constitutional: Negative for fever and malaise/fatigue.  HENT: Negative for congestion.   Eyes: Negative for discharge.  Respiratory: Negative for shortness of breath.   Cardiovascular: Negative for chest pain, palpitations and leg swelling.  Gastrointestinal: Positive for heartburn. Negative for nausea, abdominal pain and diarrhea.  Genitourinary: Negative for dysuria.  Musculoskeletal: Negative for falls.  Skin: Negative for rash.  Neurological: Negative for loss of consciousness and headaches.  Endo/Heme/Allergies: Negative for polydipsia.  Psychiatric/Behavioral: Negative for depression and suicidal ideas. The patient is not nervous/anxious and does not have insomnia.     Objective  BP 112/72  Pulse 101  Temp(Src) 98.1 F (36.7 C) (Oral)  Ht 5' 4.5" (1.638 m)  Wt 235 lb 1.3 oz (106.632 kg)  BMI 39.74 kg/m2  SpO2 98%  Physical Exam  Physical Exam  Constitutional: She is oriented to person, place, and time and well-developed, well-nourished, and in no distress. No distress.  HENT:  Head: Normocephalic and atraumatic.  Eyes: Conjunctivae are normal.  Neck: Neck supple. No thyromegaly present.  Cardiovascular: Normal rate, regular rhythm and normal heart sounds.   No murmur  heard. Pulmonary/Chest: Effort normal and breath sounds normal. She has no wheezes.  Abdominal: She exhibits no distension and no mass.  Musculoskeletal: She exhibits no edema.  Lymphadenopathy:    She has no cervical adenopathy.  Neurological: She is alert and oriented to person, place, and time.  Skin: Skin is warm and  dry. No rash noted. She is not diaphoretic.  Psychiatric: Memory, affect and judgment normal.    Lab Results  Component Value Date   TSH 0.18* 08/22/2013   Lab Results  Component Value Date   WBC 3.5* 03/31/2013   HGB 14.8 03/31/2013   HCT 43.5 03/31/2013   MCV 86.8 03/31/2013   PLT 330 03/31/2013   Lab Results  Component Value Date   CREATININE 0.86 03/31/2013   BUN 8 03/31/2013   NA 138 03/31/2013   K 4.2 03/31/2013   CL 102 03/31/2013   CO2 27 03/31/2013   Lab Results  Component Value Date   ALT 17 03/31/2013   AST 16 03/31/2013   ALKPHOS 69 03/31/2013   BILITOT 0.4 03/31/2013   Lab Results  Component Value Date   CHOL 200 03/31/2013   Lab Results  Component Value Date   HDL 49 03/31/2013   Lab Results  Component Value Date   LDLCALC 117* 03/31/2013   Lab Results  Component Value Date   TRIG 169* 03/31/2013   Lab Results  Component Value Date   CHOLHDL 4.1 03/31/2013     Assessment & Plan  HYPERTENSION Well controlled, no changes to meds. Encouraged heart healthy diet such as the DASH diet and exercise as tolerated.   HYPERLIPIDEMIA Encouraged heart healthy diet, increase exercise, avoid trans fats, consider a krill oil cap daily  HYPOTHYROIDISM On Levothyroxine, continue to monitor  GERD Improving. Avoid offending foods, start probiotics. Do not eat large meals in late evening and consider raising head of bed.   Obesity, unspecified Encouraged DASH diet, decrease po intake and increase exercise as tolerated. Needs 7-8 hours of sleep nightly. Avoid trans fats, eat small, frequent meals every 4-5 hours with lean proteins, complex carbs and  healthy fats. Minimize simple carbs, GMO foods.  Diabetes hgba1c acceptable, minimize simple carbs. Increase exercise as tolerated. Continue current meds

## 2013-09-21 NOTE — Assessment & Plan Note (Signed)
hgba1c acceptable, minimize simple carbs. Increase exercise as tolerated. Continue current meds 

## 2013-09-21 NOTE — Assessment & Plan Note (Signed)
Encouraged heart healthy diet, increase exercise, avoid trans fats, consider a krill oil cap daily 

## 2013-09-21 NOTE — Assessment & Plan Note (Signed)
Well controlled, no changes to meds. Encouraged heart healthy diet such as the DASH diet and exercise as tolerated.  °

## 2013-09-21 NOTE — Assessment & Plan Note (Signed)
Improving. Avoid offending foods, start probiotics. Do not eat large meals in late evening and consider raising head of bed.

## 2013-09-21 NOTE — Assessment & Plan Note (Signed)
Encouraged DASH diet, decrease po intake and increase exercise as tolerated. Needs 7-8 hours of sleep nightly. Avoid trans fats, eat small, frequent meals every 4-5 hours with lean proteins, complex carbs and healthy fats. Minimize simple carbs, GMO foods. 

## 2013-09-21 NOTE — Assessment & Plan Note (Signed)
On Levothyroxine, continue to monitor 

## 2013-09-25 LAB — RENAL FUNCTION PANEL
Albumin: 4.2 g/dL (ref 3.5–5.2)
BUN: 11 mg/dL (ref 6–23)
CO2: 25 mEq/L (ref 19–32)
Calcium: 9.6 mg/dL (ref 8.4–10.5)
Chloride: 105 mEq/L (ref 96–112)
Creat: 0.99 mg/dL (ref 0.50–1.10)
Glucose, Bld: 98 mg/dL (ref 70–99)
Phosphorus: 3.2 mg/dL (ref 2.3–4.6)
Potassium: 4 mEq/L (ref 3.5–5.3)
Sodium: 140 mEq/L (ref 135–145)

## 2013-09-25 LAB — HEPATIC FUNCTION PANEL
ALT: 22 U/L (ref 0–35)
AST: 19 U/L (ref 0–37)
Albumin: 4.2 g/dL (ref 3.5–5.2)
Alkaline Phosphatase: 64 U/L (ref 39–117)
Bilirubin, Direct: <0.1 mg/dL (ref 0.0–0.3)
Total Bilirubin: 0.2 mg/dL (ref 0.2–1.2)
Total Protein: 6.8 g/dL (ref 6.0–8.3)

## 2013-09-25 LAB — CBC
HCT: 40.8 % (ref 36.0–46.0)
Hemoglobin: 13.7 g/dL (ref 12.0–15.0)
MCH: 30.2 pg (ref 26.0–34.0)
MCHC: 33.6 g/dL (ref 30.0–36.0)
MCV: 89.9 fL (ref 78.0–100.0)
Platelets: 366 10*3/uL (ref 150–400)
RBC: 4.54 MIL/uL (ref 3.87–5.11)
RDW: 14.7 % (ref 11.5–15.5)
WBC: 5.1 10*3/uL (ref 4.0–10.5)

## 2013-09-25 LAB — HEMOGLOBIN A1C
Hgb A1c MFr Bld: 6.2 % — ABNORMAL HIGH (ref <5.7)
Mean Plasma Glucose: 131 mg/dL — ABNORMAL HIGH (ref <117)

## 2013-09-25 LAB — LIPID PANEL
Cholesterol: 213 mg/dL — ABNORMAL HIGH (ref 0–200)
HDL: 33 mg/dL — ABNORMAL LOW (ref >39)
LDL Cholesterol: 115 mg/dL — ABNORMAL HIGH (ref 0–99)
Total CHOL/HDL Ratio: 6.5 Ratio
Triglycerides: 326 mg/dL — ABNORMAL HIGH (ref <150)
VLDL: 65 mg/dL — ABNORMAL HIGH (ref 0–40)

## 2013-09-26 ENCOUNTER — Encounter: Payer: Self-pay | Admitting: Family Medicine

## 2013-09-26 ENCOUNTER — Telehealth: Payer: Self-pay

## 2013-09-26 DIAGNOSIS — E785 Hyperlipidemia, unspecified: Secondary | ICD-10-CM

## 2013-09-26 DIAGNOSIS — R748 Abnormal levels of other serum enzymes: Secondary | ICD-10-CM

## 2013-09-26 MED ORDER — ATORVASTATIN CALCIUM 10 MG PO TABS: 10.0000 mg | ORAL_TABLET | Freq: Every day | ORAL | Status: DC

## 2013-09-26 NOTE — Telephone Encounter (Signed)
Lab order placed per md

## 2013-09-26 NOTE — Telephone Encounter (Signed)
Lab order placed and rx sent per md

## 2013-10-10 ENCOUNTER — Other Ambulatory Visit: Payer: Self-pay | Admitting: Family Medicine

## 2013-10-13 NOTE — Telephone Encounter (Signed)
Refill request for venlafaxine Last filled by MD on - 06/05/2013 #30 x3 Last Appt: 09/16/2013 Next Appt: 01/20/2014 Please advise refill?

## 2013-10-14 ENCOUNTER — Other Ambulatory Visit: Payer: Self-pay | Admitting: Family Medicine

## 2013-10-21 ENCOUNTER — Other Ambulatory Visit (INDEPENDENT_AMBULATORY_CARE_PROVIDER_SITE_OTHER): Payer: 59

## 2013-10-21 DIAGNOSIS — E039 Hypothyroidism, unspecified: Secondary | ICD-10-CM

## 2013-10-22 ENCOUNTER — Encounter: Payer: Self-pay | Admitting: Family Medicine

## 2013-10-22 ENCOUNTER — Other Ambulatory Visit: Payer: Self-pay | Admitting: Internal Medicine

## 2013-10-22 ENCOUNTER — Encounter: Payer: Self-pay | Admitting: Internal Medicine

## 2013-10-22 DIAGNOSIS — E039 Hypothyroidism, unspecified: Secondary | ICD-10-CM

## 2013-10-22 DIAGNOSIS — R748 Abnormal levels of other serum enzymes: Secondary | ICD-10-CM

## 2013-10-22 LAB — TSH: TSH: 0.05 u[IU]/mL — ABNORMAL LOW (ref 0.35–4.50)

## 2013-10-22 LAB — T4, FREE: Free T4: 0.9 ng/dL (ref 0.60–1.60)

## 2013-10-22 MED ORDER — SYNTHROID 100 MCG PO TABS: 100.0000 ug | ORAL_TABLET | Freq: Every day | ORAL | Status: DC

## 2013-10-27 ENCOUNTER — Other Ambulatory Visit: Payer: Self-pay | Admitting: Family Medicine

## 2013-10-28 ENCOUNTER — Encounter: Payer: Self-pay | Admitting: Family Medicine

## 2013-10-28 LAB — CK TOTAL AND CKMB (NOT AT ARMC)
CK, MB: 2.8 ng/mL (ref 0.0–5.0)
Relative Index: 1 (ref 0.0–4.0)
Total CK: 270 U/L — ABNORMAL HIGH (ref 7–177)

## 2013-10-29 ENCOUNTER — Encounter: Payer: Self-pay | Admitting: Internal Medicine

## 2013-10-29 ENCOUNTER — Encounter: Payer: Self-pay | Admitting: Family Medicine

## 2013-10-30 LAB — TSH: TSH: 0.983 u[IU]/mL (ref 0.350–4.500)

## 2013-10-30 NOTE — Telephone Encounter (Signed)
I will give this to [facility]

## 2013-11-11 ENCOUNTER — Encounter: Payer: Self-pay | Admitting: Internal Medicine

## 2013-11-12 ENCOUNTER — Other Ambulatory Visit: Payer: Self-pay | Admitting: Family Medicine

## 2013-11-17 ENCOUNTER — Encounter: Payer: Self-pay | Admitting: Family Medicine

## 2013-11-18 ENCOUNTER — Ambulatory Visit (HOSPITAL_BASED_OUTPATIENT_CLINIC_OR_DEPARTMENT_OTHER)
Admission: RE | Admit: 2013-11-18 | Discharge: 2013-11-18 | Disposition: A | Discharge status: Home / Self Care | Payer: 59 | Source: Ambulatory Visit | Attending: Family Medicine | Admitting: Family Medicine

## 2013-11-18 ENCOUNTER — Ambulatory Visit (INDEPENDENT_AMBULATORY_CARE_PROVIDER_SITE_OTHER): Payer: 59 | Admitting: Family Medicine

## 2013-11-18 ENCOUNTER — Encounter: Payer: Self-pay | Admitting: Family Medicine

## 2013-11-18 VITALS — BP 108/80 | HR 77 | Temp 98.1°F | Ht 64.5 in | Wt 244.0 lb

## 2013-11-18 DIAGNOSIS — M5430 Sciatica, unspecified side: Secondary | ICD-10-CM | POA: Insufficient documentation

## 2013-11-18 DIAGNOSIS — F329 Major depressive disorder, single episode, unspecified: Secondary | ICD-10-CM

## 2013-11-18 DIAGNOSIS — R109 Unspecified abdominal pain: Secondary | ICD-10-CM

## 2013-11-18 DIAGNOSIS — F32A Depression, unspecified: Secondary | ICD-10-CM

## 2013-11-18 DIAGNOSIS — F341 Dysthymic disorder: Secondary | ICD-10-CM | POA: Insufficient documentation

## 2013-11-18 DIAGNOSIS — F419 Anxiety disorder, unspecified: Secondary | ICD-10-CM

## 2013-11-18 DIAGNOSIS — M5030 Other cervical disc degeneration, unspecified cervical region: Secondary | ICD-10-CM | POA: Insufficient documentation

## 2013-11-18 DIAGNOSIS — IMO0001 Reserved for inherently not codable concepts without codable children: Secondary | ICD-10-CM | POA: Insufficient documentation

## 2013-11-18 DIAGNOSIS — M542 Cervicalgia: Secondary | ICD-10-CM

## 2013-11-18 DIAGNOSIS — R630 Anorexia: Secondary | ICD-10-CM | POA: Insufficient documentation

## 2013-11-18 DIAGNOSIS — M5441 Lumbago with sciatica, right side: Secondary | ICD-10-CM

## 2013-11-18 DIAGNOSIS — M5442 Lumbago with sciatica, left side: Secondary | ICD-10-CM

## 2013-11-18 DIAGNOSIS — I10 Essential (primary) hypertension: Secondary | ICD-10-CM

## 2013-11-18 DIAGNOSIS — N951 Menopausal and female climacteric states: Secondary | ICD-10-CM

## 2013-11-18 DIAGNOSIS — R609 Edema, unspecified: Secondary | ICD-10-CM

## 2013-11-18 DIAGNOSIS — E785 Hyperlipidemia, unspecified: Secondary | ICD-10-CM

## 2013-11-18 LAB — HEPATIC FUNCTION PANEL
ALT: 45 U/L — ABNORMAL HIGH (ref 0–35)
AST: 20 U/L (ref 0–37)
Albumin: 4.3 g/dL (ref 3.5–5.2)
Alkaline Phosphatase: 68 U/L (ref 39–117)
Bilirubin, Direct: <0.1 mg/dL (ref 0.0–0.3)
Total Bilirubin: 0.2 mg/dL (ref 0.2–1.2)
Total Protein: 7.2 g/dL (ref 6.0–8.3)

## 2013-11-18 LAB — CBC
HCT: 41.7 % (ref 36.0–46.0)
Hemoglobin: 14.5 g/dL (ref 12.0–15.0)
MCH: 30.3 pg (ref 26.0–34.0)
MCHC: 34.8 g/dL (ref 30.0–36.0)
MCV: 87.1 fL (ref 78.0–100.0)
Platelets: 371 10*3/uL (ref 150–400)
RBC: 4.79 MIL/uL (ref 3.87–5.11)
RDW: 14.4 % (ref 11.5–15.5)
WBC: 5.7 10*3/uL (ref 4.0–10.5)

## 2013-11-18 LAB — RENAL FUNCTION PANEL
Albumin: 4.3 g/dL (ref 3.5–5.2)
BUN: 11 mg/dL (ref 6–23)
CO2: 25 mEq/L (ref 19–32)
Calcium: 9.7 mg/dL (ref 8.4–10.5)
Chloride: 105 mEq/L (ref 96–112)
Creat: 1.07 mg/dL (ref 0.50–1.10)
Glucose, Bld: 89 mg/dL (ref 70–99)
Phosphorus: 3.6 mg/dL (ref 2.3–4.6)
Potassium: 4.3 mEq/L (ref 3.5–5.3)
Sodium: 139 mEq/L (ref 135–145)

## 2013-11-18 LAB — SEDIMENTATION RATE: Sed Rate: 18 mm/hr (ref 0–22)

## 2013-11-18 MED ORDER — VENLAFAXINE HCL ER 75 MG PO CP24: 75.0000 mg | ORAL_CAPSULE | Freq: Every day | ORAL | Status: DC

## 2013-11-18 MED ORDER — FLUOXETINE HCL 10 MG PO CAPS: 10.0000 mg | ORAL_CAPSULE | Freq: Every day | ORAL | Status: DC

## 2013-11-18 MED ORDER — FUROSEMIDE 20 MG PO TABS: 20.0000 mg | ORAL_TABLET | Freq: Every day | ORAL | Status: DC | PRN

## 2013-11-18 NOTE — Patient Instructions (Signed)
Dehydration, Adult Dehydration is when you lose more fluids from the body than you take in. Vital organs like the kidneys, brain, and heart cannot function without a proper amount of fluids and salt. Any loss of fluids from the body can cause dehydration.  CAUSES   Vomiting.  Diarrhea.  Excessive sweating.  Excessive urine output.  Fever. SYMPTOMS  Mild dehydration  Thirst.  Dry lips.  Slightly dry mouth. Moderate dehydration  Very dry mouth.  Sunken eyes.  Skin does not bounce back quickly when lightly pinched and released.  Dark urine and decreased urine production.  Decreased tear production.  Headache. Severe dehydration  Very dry mouth.  Extreme thirst.  Rapid, weak pulse (more than 100 beats per minute at rest).  Cold hands and feet.  Not able to sweat in spite of heat and temperature.  Rapid breathing.  Blue lips.  Confusion and lethargy.  Difficulty being awakened.  Minimal urine production.  No tears. DIAGNOSIS  Your caregiver will diagnose dehydration based on your symptoms and your exam. Blood and urine tests will help confirm the diagnosis. The diagnostic evaluation should also identify the cause of dehydration. TREATMENT  Treatment of mild or moderate dehydration can often be done at home by increasing the amount of fluids that you drink. It is best to drink small amounts of fluid more often. Drinking too much at one time can make vomiting worse. Refer to the home care instructions below. Severe dehydration needs to be treated at the hospital where you will probably be given intravenous (IV) fluids that contain water and electrolytes. HOME CARE INSTRUCTIONS   Ask your caregiver about specific rehydration instructions.  Drink enough fluids to keep your urine clear or pale yellow.  Drink small amounts frequently if you have nausea and vomiting.  Eat as you normally do.  Avoid:  Foods or drinks high in sugar.  Carbonated  drinks.  Juice.  Extremely hot or cold fluids.  Drinks with caffeine.  Fatty, greasy foods.  Alcohol.  Tobacco.  Overeating.  Gelatin desserts.  Wash your hands well to avoid spreading bacteria and viruses.  Only take over-the-counter or prescription medicines for pain, discomfort, or fever as directed by your caregiver.  Ask your caregiver if you should continue all prescribed and over-the-counter medicines.  Keep all follow-up appointments with your caregiver. SEEK MEDICAL CARE IF:  You have abdominal pain and it increases or stays in one area (localizes).  You have a rash, stiff neck, or severe headache.  You are irritable, sleepy, or difficult to awaken.  You are weak, dizzy, or extremely thirsty. SEEK IMMEDIATE MEDICAL CARE IF:   You are unable to keep fluids down or you get worse despite treatment.  You have frequent episodes of vomiting or diarrhea.  You have blood or green matter (bile) in your vomit.  You have blood in your stool or your stool looks black and tarry.  You have not urinated in 6 to 8 hours, or you have only urinated a small amount of very dark urine.  You have a fever.  You faint. MAKE SURE YOU:   Understand these instructions.  Will watch your condition.  Will get help right away if you are not doing well or get worse. Document Released: 04/10/2005 Document Revised: 07/03/2011 Document Reviewed: 11/28/2010 [employer] Patient Information 2015 [employer]. This information is not intended to replace advice given to you by your health care provider. Make sure you discuss any questions you have with your health care   provider.  

## 2013-11-18 NOTE — Progress Notes (Signed)
Pre visit review using our clinic review tool, if applicable. No additional management support is needed unless otherwise documented below in the visit note. 

## 2013-11-24 ENCOUNTER — Encounter: Payer: Self-pay | Admitting: Family Medicine

## 2013-11-24 NOTE — Assessment & Plan Note (Signed)
Chronic low back pain and neck pain, xrays done today and referred to neurosurgery

## 2013-11-24 NOTE — Assessment & Plan Note (Signed)
Well controlled, no changes to meds. Encouraged heart healthy diet such as the DASH diet and exercise as tolerated.  °

## 2013-11-24 NOTE — Progress Notes (Signed)
Patient ID: [name], female   DOB: 07-Nov-1960, 53 y.o.   MRN: [id number] [name] [id number] Jan 01, 1961 11/24/2013      Progress Note-Follow Up  Subjective  Chief Complaint  Chief Complaint  Patient presents with  . Follow-up    HPI  Patient is a 53 year old female in today for routine medical care. Is frustrated with increased pain. Pain in arms are worse with. No recent illness, Denies CP/palp/SOB/HA/congestion/fevers/GI or GU c/o. Taking meds as prescribed. Struggling with upper and lower back pain, she felt it was worse on lipitor so she stopped iti.   Past Medical History  Diagnosis Date  . GERD (gastroesophageal reflux disease)   . Hypertension   . Thyroid disease     hypothyroidism  . Anxiety   . Depression   . Hyperlipidemia   . Chronic rhinosinusitis   . OSA (obstructive sleep apnea)   . Obesity   . Colon polyps   . Fibroid, uterine   . Hoarseness   . Glaucoma 12-13  . IBS (irritable bowel syndrome) 02/13/2011  . Perimenopause 10/05/2012  . Edema 01/06/2013  . Diabetes 01/06/2013  . Chronic pain syndrome 01/06/2013  . Cough 01/06/2013  . Hypokalemia 02/05/2013  . Low back pain 03/03/2013  . Hyperglycemia 04/13/2013  . Hoarseness of voice 05/11/2013  . Obesity, unspecified 05/11/2013  . Raynaud disease 06/16/2013    Past Surgical History  Procedure Laterality Date  . Cholecystectomy    . Abdominal hysterectomy      Family History  Problem Relation Age of Onset  . Alcohol abuse Father   . Cancer Father     renal and colon  . Hyperlipidemia Father   . Hypertension Father   . Diabetes    . Cancer Paternal Grandfather     colon    History   Social History  . Marital Status: Single    Spouse Name: N/A    Number of Children: N/A  . Years of Education: N/A   Occupational History  . Not on file.   Social History Main Topics  . Smoking status: Former Smoker -- 0.50 packs/day for 30 years    Types: Cigarettes    Quit date: 04/25/2003  .  Smokeless tobacco: Never Used     Comment: smoked since age 27  . Alcohol Use: No  . Drug Use: Not on file  . Sexual Activity: Not on file   Other Topics Concern  . Not on file   Social History Narrative   Endo-- Dr Dwyane Dee   ENT--Dr shoemaker   GI--Dr Buccini   Pulm--Dr Clance   Rheum--Dr Charlestine Night          Current Outpatient Prescriptions on File Prior to Visit  Medication Sig Dispense Refill  . ALPRAZolam (XANAX) 0.25 MG tablet Take 1 tablet (0.25 mg total) by mouth 2 (two) times daily as needed for sleep.  20 tablet  0  . aspirin EC 81 MG tablet Take 1 tablet (81 mg total) by mouth daily. Aspirin only causes GI upset  30 tablet  3  . BYSTOLIC 5 MG tablet TAKE 1 TABLET BY MOUTH DAILY  30 tablet  3  . cholecalciferol (VITAMIN D-400) 400 UNITS TABS Take 400 Units by mouth daily.      . clotrimazole (LOTRIMIN) 1 % cream Apply topically 2 (two) times daily.  60 g  1  . dexlansoprazole (DEXILANT) 60 MG capsule Take 1 capsule (60 mg total) by mouth daily.  30 capsule    .  diphenhydrAMINE (BENADRYL) 25 MG tablet Take 25 mg by mouth at bedtime as needed.        Marland Kitchen estradiol (ESTRACE) 1 MG tablet Take 1 mg by mouth daily.        Marland Kitchen glucose blood (TRUE CARE TEST STRIP PACK) test strip Use as instructed  100 each  3  . latanoprost (XALATAN) 0.005 % ophthalmic solution Place 1 drop into both eyes at bedtime.      Marland Kitchen losartan (COZAAR) 50 MG tablet Take 1 tablet (50 mg total) by mouth daily.  90 tablet  3  . metFORMIN (GLUCOPHAGE-XR) 500 MG 24 hr tablet Take 1 tablet (500 mg total) by mouth daily with breakfast.  30 tablet  3  . omeprazole-sodium bicarbonate (ZEGERID) 40-1100 MG per capsule Take 1 capsule by mouth daily.  30 capsule  4  . spironolactone (ALDACTONE) 25 MG tablet TAKE 1 TABLET BY MOUTH 2 TIMES DAILY  60 tablet  1  . SYNTHROID 100 MCG tablet Take 1 tablet (100 mcg total) by mouth daily before breakfast.  60 tablet  2  . TRUEPLUS LANCETS 30G MISC 1 Device by Does not apply route daily  as needed. DX 250.00 by Does not apply route daily as needed. DX 250.00  100 each  2   No current facility-administered medications on file prior to visit.    Allergies  Allergen Reactions  . Aspirin     REACTION: Upset stomach  . Amoxicillin Rash    Review of Systems  Review of Systems  Constitutional: Negative for fever and malaise/fatigue.  HENT: Negative for congestion.   Eyes: Negative for discharge.  Respiratory: Negative for shortness of breath.   Cardiovascular: Negative for chest pain, palpitations and leg swelling.  Gastrointestinal: Negative for nausea, abdominal pain and diarrhea.  Genitourinary: Negative for dysuria.  Musculoskeletal: Negative for falls.  Skin: Negative for rash.  Neurological: Negative for loss of consciousness and headaches.  Endo/Heme/Allergies: Negative for polydipsia.  Psychiatric/Behavioral: Negative for depression and suicidal ideas. The patient is not nervous/anxious and does not have insomnia.     Objective  BP 108/80  Pulse 77  Temp(Src) 98.1 F (36.7 C) (Oral)  Ht 5' 4.5" (1.638 m)  Wt 244 lb (110.678 kg)  BMI 41.25 kg/m2  SpO2 95%  Physical Exam  Physical Exam  Constitutional: She is oriented to person, place, and time and well-developed, well-nourished, and in no distress. No distress.  HENT:  Head: Normocephalic and atraumatic.  Eyes: Conjunctivae are normal.  Neck: Neck supple. No thyromegaly present.  Cardiovascular: Normal rate, regular rhythm and normal heart sounds.   No murmur heard. Pulmonary/Chest: Effort normal and breath sounds normal. She has no wheezes.  Abdominal: She exhibits no distension and no mass.  Musculoskeletal: She exhibits no edema.  Lymphadenopathy:    She has no cervical adenopathy.  Neurological: She is alert and oriented to person, place, and time.  Skin: Skin is warm and dry. No rash noted. She is not diaphoretic.  Psychiatric: Memory, affect and judgment normal.    Lab Results   Component Value Date   TSH 0.983 10/27/2013   Lab Results  Component Value Date   WBC 5.7 11/18/2013   HGB 14.5 11/18/2013   HCT 41.7 11/18/2013   MCV 87.1 11/18/2013   PLT 371 11/18/2013   Lab Results  Component Value Date   CREATININE 1.07 11/18/2013   BUN 11 11/18/2013   NA 139 11/18/2013   K 4.3 11/18/2013   CL 105 11/18/2013  CO2 25 11/18/2013   Lab Results  Component Value Date   ALT 45* 11/18/2013   AST 20 11/18/2013   ALKPHOS 68 11/18/2013   BILITOT 0.2 11/18/2013   Lab Results  Component Value Date   CHOL 213* 09/25/2013   Lab Results  Component Value Date   HDL 33* 09/25/2013   Lab Results  Component Value Date   LDLCALC 115* 09/25/2013   Lab Results  Component Value Date   TRIG 326* 09/25/2013   Lab Results  Component Value Date   CHOLHDL 6.5 09/25/2013     Assessment & Plan  HYPERTENSION Well controlled, no changes to meds. Encouraged heart healthy diet such as the DASH diet and exercise as tolerated.   HYPERLIPIDEMIA Encouraged heart healthy diet, increase exercise, avoid trans fats, consider a krill oil cap daily. Did not tolerate Lipitor  BACK PAIN, LUMBAR, CHRONIC Chronic low back pain and neck pain, xrays done today and referred to neurosurgery  Perimenopause Encouraged ICOOL prn, minimize simple carbs, increase rest and fluids

## 2013-11-24 NOTE — Assessment & Plan Note (Signed)
Encouraged ICOOL prn, minimize simple carbs, increase rest and fluids

## 2013-11-24 NOTE — Assessment & Plan Note (Addendum)
Encouraged heart healthy diet, increase exercise, avoid trans fats, consider a krill oil cap daily. Did not tolerate Lipitor

## 2013-11-26 ENCOUNTER — Other Ambulatory Visit: Payer: Self-pay | Admitting: Family Medicine

## 2013-11-26 DIAGNOSIS — R945 Abnormal results of liver function studies: Secondary | ICD-10-CM

## 2013-11-27 ENCOUNTER — Encounter: Payer: Self-pay | Admitting: Internal Medicine

## 2013-12-16 ENCOUNTER — Ambulatory Visit (INDEPENDENT_AMBULATORY_CARE_PROVIDER_SITE_OTHER): Payer: 59 | Admitting: Family Medicine

## 2013-12-16 ENCOUNTER — Encounter: Payer: Self-pay | Admitting: Family Medicine

## 2013-12-16 VITALS — BP 110/80 | HR 81 | Temp 98.3°F | Ht 64.5 in | Wt 243.0 lb

## 2013-12-16 DIAGNOSIS — F4323 Adjustment disorder with mixed anxiety and depressed mood: Secondary | ICD-10-CM

## 2013-12-16 DIAGNOSIS — K219 Gastro-esophageal reflux disease without esophagitis: Secondary | ICD-10-CM

## 2013-12-16 DIAGNOSIS — F341 Dysthymic disorder: Secondary | ICD-10-CM

## 2013-12-16 DIAGNOSIS — M545 Low back pain, unspecified: Secondary | ICD-10-CM

## 2013-12-16 DIAGNOSIS — I10 Essential (primary) hypertension: Secondary | ICD-10-CM

## 2013-12-16 DIAGNOSIS — R109 Unspecified abdominal pain: Secondary | ICD-10-CM

## 2013-12-16 DIAGNOSIS — E669 Obesity, unspecified: Secondary | ICD-10-CM

## 2013-12-16 DIAGNOSIS — F418 Other specified anxiety disorders: Secondary | ICD-10-CM

## 2013-12-16 DIAGNOSIS — E785 Hyperlipidemia, unspecified: Secondary | ICD-10-CM

## 2013-12-16 MED ORDER — HYOSCYAMINE SULFATE 0.125 MG SL SUBL: 0.1250 mg | SUBLINGUAL_TABLET | SUBLINGUAL | Status: DC | PRN

## 2013-12-16 MED ORDER — FLUOXETINE HCL 20 MG PO TABS: 20.0000 mg | ORAL_TABLET | Freq: Every day | ORAL | Status: DC

## 2013-12-16 NOTE — Patient Instructions (Signed)
  Consider Calcium, magnesium supplements  Muscle Cramps and Spasms Muscle cramps and spasms occur when a muscle or muscles tighten and you have no control over this tightening (involuntary muscle contraction). They are a common problem and can develop in any muscle. The most common place is in the calf muscles of the leg. Both muscle cramps and muscle spasms are involuntary muscle contractions, but they also have differences:   Muscle cramps are sporadic and painful. They may last a few seconds to a quarter of an hour. Muscle cramps are often more forceful and last longer than muscle spasms.  Muscle spasms may or may not be painful. They may also last just a few seconds or much longer. CAUSES  It is uncommon for cramps or spasms to be due to a serious underlying problem. In many cases, the cause of cramps or spasms is unknown. Some common causes are:   Overexertion.   Overuse from repetitive motions (doing the same thing over and over).   Remaining in a certain position for a long period of time.   Improper preparation, form, or technique while performing a sport or activity.   Dehydration.   Injury.   Side effects of some medicines.   Abnormally low levels of the salts and ions in your blood (electrolytes), especially potassium and calcium. This could happen if you are taking water pills (diuretics) or you are pregnant.  Some underlying medical problems can make it more likely to develop cramps or spasms. These include, but are not limited to:   Diabetes.   Parkinson disease.   Hormone disorders, such as thyroid problems.   Alcohol abuse.   Diseases specific to muscles, joints, and bones.   Blood vessel disease where not enough blood is getting to the muscles.  HOME CARE INSTRUCTIONS   Stay well hydrated. Drink enough water and fluids to keep your urine clear or pale yellow.  It may be helpful to massage, stretch, and relax the affected muscle.  For tight  or tense muscles, use a warm towel, heating pad, or hot shower water directed to the affected area.  If you are sore or have pain after a cramp or spasm, applying ice to the affected area may relieve discomfort.  Put ice in a plastic bag.  Place a towel between your skin and the bag.  Leave the ice on for 15-20 minutes, 03-04 times a day.  Medicines used to treat a known cause of cramps or spasms may help reduce their frequency or severity. Only take over-the-counter or prescription medicines as directed by your caregiver. SEEK MEDICAL CARE IF:  Your cramps or spasms get more severe, more frequent, or do not improve over time.  MAKE SURE YOU:   Understand these instructions.  Will watch your condition.  Will get help right away if you are not doing well or get worse. Document Released: 09/30/2001 Document Revised: 08/05/2012 Document Reviewed: 03/27/2012 [facility] Patient Information 2015 [place], [place]. This information is not intended to replace advice given to you by your health care provider. Make sure you discuss any questions you have with your health care provider.

## 2013-12-16 NOTE — Progress Notes (Signed)
Pre visit review using our clinic review tool, if applicable. No additional management support is needed unless otherwise documented below in the visit note. 

## 2013-12-17 NOTE — Assessment & Plan Note (Signed)
Improving but still struggling will increase Fluoxetine.

## 2013-12-17 NOTE — Progress Notes (Signed)
Patient ID: [name], female   DOB: 02-Jun-1960, 53 y.o.   MRN: [id number] LANDA MULLINAX [id number] 04-05-1961 12/17/2013      Progress Note-Follow Up  Subjective  Chief Complaint  Chief Complaint  Patient presents with  . Follow-up    4 week    HPI  Patient is a 53 year old female in today for routine medical care. She is in today for followup. She is reestablish with neurosurgery but there've been no changes to her care.. No incontinence. Her mood is slightly but not fully with fluoxetine 10 mg. No suicidal ideation but anhedonia as noted. Denies CP/palp/SOB/HA/congestion/fevers/GI or GU c/o. Taking meds as prescribed  Past Medical History  Diagnosis Date  . GERD (gastroesophageal reflux disease)   . Hypertension   . Thyroid disease     hypothyroidism  . Anxiety   . Depression   . Hyperlipidemia   . Chronic rhinosinusitis   . OSA (obstructive sleep apnea)   . Obesity   . Colon polyps   . Fibroid, uterine   . Hoarseness   . Glaucoma 12-13  . IBS (irritable bowel syndrome) 02/13/2011  . Perimenopause 10/05/2012  . Edema 01/06/2013  . Diabetes 01/06/2013  . Chronic pain syndrome 01/06/2013  . Cough 01/06/2013  . Hypokalemia 02/05/2013  . Low back pain 03/03/2013  . Hyperglycemia 04/13/2013  . Hoarseness of voice 05/11/2013  . Obesity, unspecified 05/11/2013  . Raynaud disease 06/16/2013    Past Surgical History  Procedure Laterality Date  . Cholecystectomy    . Abdominal hysterectomy      Family History  Problem Relation Age of Onset  . Alcohol abuse Father   . Cancer Father     renal and colon  . Hyperlipidemia Father   . Hypertension Father   . Diabetes    . Cancer Paternal Grandfather     colon    History   Social History  . Marital Status: Single    Spouse Name: N/A    Number of Children: N/A  . Years of Education: N/A   Occupational History  . Not on file.   Social History Main Topics  . Smoking status: Former Smoker -- 0.50 packs/day  for 30 years    Types: Cigarettes    Quit date: 04/25/2003  . Smokeless tobacco: Never Used     Comment: smoked since age 76  . Alcohol Use: No  . Drug Use: Not on file  . Sexual Activity: Not on file   Other Topics Concern  . Not on file   Social History Narrative   Endo-- Dr Dwyane Dee   ENT--Dr shoemaker   GI--Dr Buccini   Pulm--Dr Clance   Rheum--Dr Charlestine Night          Current Outpatient Prescriptions on File Prior to Visit  Medication Sig Dispense Refill  . ALPRAZolam (XANAX) 0.25 MG tablet Take 1 tablet (0.25 mg total) by mouth 2 (two) times daily as needed for sleep.  20 tablet  0  . aspirin EC 81 MG tablet Take 1 tablet (81 mg total) by mouth daily. Aspirin only causes GI upset  30 tablet  3  . BYSTOLIC 5 MG tablet TAKE 1 TABLET BY MOUTH DAILY  30 tablet  3  . cholecalciferol (VITAMIN D-400) 400 UNITS TABS Take 400 Units by mouth daily.      . clotrimazole (LOTRIMIN) 1 % cream Apply topically 2 (two) times daily.  60 g  1  . dexlansoprazole (DEXILANT) 60 MG capsule Take  1 capsule (60 mg total) by mouth daily.  30 capsule    . diphenhydrAMINE (BENADRYL) 25 MG tablet Take 25 mg by mouth at bedtime as needed.        Marland Kitchen estradiol (ESTRACE) 1 MG tablet Take 1 mg by mouth daily.        . furosemide (LASIX) 20 MG tablet Take 1-2 tablets (20-40 mg total) by mouth daily as needed for fluid or edema.  40 tablet  3  . glucose blood (TRUE CARE TEST STRIP PACK) test strip Use as instructed  100 each  3  . latanoprost (XALATAN) 0.005 % ophthalmic solution Place 1 drop into both eyes at bedtime.      Marland Kitchen losartan (COZAAR) 50 MG tablet Take 1 tablet (50 mg total) by mouth daily.  90 tablet  3  . metFORMIN (GLUCOPHAGE-XR) 500 MG 24 hr tablet Take 1 tablet (500 mg total) by mouth daily with breakfast.  30 tablet  3  . omeprazole-sodium bicarbonate (ZEGERID) 40-1100 MG per capsule Take 1 capsule by mouth daily.  30 capsule  4  . spironolactone (ALDACTONE) 25 MG tablet TAKE 1 TABLET BY MOUTH 2 TIMES  DAILY  60 tablet  1  . SYNTHROID 100 MCG tablet Take 1 tablet (100 mcg total) by mouth daily before breakfast.  60 tablet  2  . TRUEPLUS LANCETS 30G MISC 1 Device by Does not apply route daily as needed. DX 250.00 by Does not apply route daily as needed. DX 250.00  100 each  2  . venlafaxine XR (EFFEXOR XR) 75 MG 24 hr capsule Take 1 capsule (75 mg total) by mouth daily with breakfast.  30 capsule  3   No current facility-administered medications on file prior to visit.    Allergies  Allergen Reactions  . Aspirin     REACTION: Upset stomach  . Amoxicillin Rash    Review of Systems  Review of Systems  Constitutional: Negative for fever and malaise/fatigue.  HENT: Negative for congestion.   Eyes: Negative for discharge.  Respiratory: Negative for shortness of breath.   Cardiovascular: Negative for chest pain, palpitations and leg swelling.  Gastrointestinal: Negative for nausea, abdominal pain and diarrhea.  Genitourinary: Negative for dysuria.  Musculoskeletal: Positive for back pain. Negative for falls.  Skin: Negative for rash.  Neurological: Negative for loss of consciousness and headaches.  Endo/Heme/Allergies: Negative for polydipsia.  Psychiatric/Behavioral: Positive for depression. Negative for suicidal ideas. The patient is nervous/anxious. The patient does not have insomnia.     Objective  BP 110/80  Pulse 81  Temp(Src) 98.3 F (36.8 C) (Oral)  Ht 5' 4.5" (1.638 m)  Wt 243 lb 0.6 oz (110.242 kg)  BMI 41.09 kg/m2  SpO2 97%  Physical Exam  Physical Exam  Constitutional: She is oriented to person, place, and time and well-developed, well-nourished, and in no distress. No distress.  HENT:  Head: Normocephalic and atraumatic.  Eyes: Conjunctivae are normal.  Neck: Neck supple. No thyromegaly present.  Cardiovascular: Normal rate, regular rhythm and normal heart sounds.   No murmur heard. Pulmonary/Chest: Effort normal and breath sounds normal. She has no  wheezes.  Abdominal: She exhibits no distension and no mass.  Musculoskeletal: She exhibits no edema.  Lymphadenopathy:    She has no cervical adenopathy.  Neurological: She is alert and oriented to person, place, and time.  Skin: Skin is warm and dry. No rash noted. She is not diaphoretic.  Psychiatric: Memory, affect and judgment normal.    Lab  Results  Component Value Date   TSH 0.983 10/27/2013   Lab Results  Component Value Date   WBC 5.7 11/18/2013   HGB 14.5 11/18/2013   HCT 41.7 11/18/2013   MCV 87.1 11/18/2013   PLT 371 11/18/2013   Lab Results  Component Value Date   CREATININE 1.07 11/18/2013   BUN 11 11/18/2013   NA 139 11/18/2013   K 4.3 11/18/2013   CL 105 11/18/2013   CO2 25 11/18/2013   Lab Results  Component Value Date   ALT 45* 11/18/2013   AST 20 11/18/2013   ALKPHOS 68 11/18/2013   BILITOT 0.2 11/18/2013   Lab Results  Component Value Date   CHOL 213* 09/25/2013   Lab Results  Component Value Date   HDL 33* 09/25/2013   Lab Results  Component Value Date   LDLCALC 115* 09/25/2013   Lab Results  Component Value Date   TRIG 326* 09/25/2013   Lab Results  Component Value Date   CHOLHDL 6.5 09/25/2013     Assessment & Plan  HYPERTENSION Well controlled, no changes to meds. Encouraged heart healthy diet such as the DASH diet and exercise as tolerated.   HYPERLIPIDEMIA Encouraged heart healthy diet, increase exercise, avoid trans fats, consider a krill oil cap daily  GERD Avoid offending foods, start probiotics. Do not eat large meals in late evening and consider raising head of bed. Minimize NSAIDs  Obesity, unspecified Encouraged DASH diet, decrease po intake and increase exercise as tolerated. Needs 7-8 hours of sleep nightly. Avoid trans fats, eat small, frequent meals every 4-5 hours with lean proteins, complex carbs and healthy fats. Minimize simple carbs  ADJ DISORDER WITH MIXED ANXIETY & DEPRESSED MOOD Improving but still struggling will increase  Fluoxetine.   Low back pain Has reestablished with Dr Sherwood Gambler of neurosurgery no MRI or new meds at this time

## 2013-12-17 NOTE — Assessment & Plan Note (Signed)
Well controlled, no changes to meds. Encouraged heart healthy diet such as the DASH diet and exercise as tolerated.  °

## 2013-12-17 NOTE — Assessment & Plan Note (Signed)
Has reestablished with Dr Sherwood Gambler of neurosurgery no MRI or new meds at this time

## 2013-12-17 NOTE — Assessment & Plan Note (Signed)
Avoid offending foods, start probiotics. Do not eat large meals in late evening and consider raising head of bed. Minimize NSAIDs

## 2013-12-17 NOTE — Assessment & Plan Note (Signed)
Encouraged DASH diet, decrease po intake and increase exercise as tolerated. Needs 7-8 hours of sleep nightly. Avoid trans fats, eat small, frequent meals every 4-5 hours with lean proteins, complex carbs and healthy fats. Minimize simple carbs 

## 2013-12-17 NOTE — Assessment & Plan Note (Signed)
Encouraged heart healthy diet, increase exercise, avoid trans fats, consider a krill oil cap daily 

## 2014-01-13 ENCOUNTER — Other Ambulatory Visit: Payer: Self-pay | Admitting: Family Medicine

## 2014-01-13 ENCOUNTER — Ambulatory Visit (INDEPENDENT_AMBULATORY_CARE_PROVIDER_SITE_OTHER): Payer: 59 | Admitting: Family Medicine

## 2014-01-13 VITALS — BP 120/80 | HR 80 | Temp 97.9°F | Resp 13 | Ht 64.5 in | Wt 241.0 lb

## 2014-01-13 DIAGNOSIS — R059 Cough, unspecified: Secondary | ICD-10-CM

## 2014-01-13 DIAGNOSIS — R05 Cough: Secondary | ICD-10-CM

## 2014-01-13 DIAGNOSIS — G4733 Obstructive sleep apnea (adult) (pediatric): Secondary | ICD-10-CM

## 2014-01-13 DIAGNOSIS — R490 Dysphonia: Secondary | ICD-10-CM

## 2014-01-13 MED ORDER — METHYLPREDNISOLONE (PAK) 4 MG PO TABS: ORAL_TABLET | ORAL | Status: DC

## 2014-01-13 MED ORDER — BENZONATATE 200 MG PO CAPS: 200.0000 mg | ORAL_CAPSULE | Freq: Two times a day (BID) | ORAL | Status: DC | PRN

## 2014-01-13 NOTE — Progress Notes (Signed)
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   She is suffering from hoarseness and cough.  Patient has seen Dr. Wilburn Cornelia who felt her hoarseness was secondary to thyroid problems.  Her endocrinologist said it is not thyroid.  She has been treated for reflux as well.  Glucose is normal most days.  She has chronic back pain and is seeing Dr. Sherwood Gambler.  Patient has had similar symptoms as today last fall.  She has OSA and cannot sleep with CPAP machine.  Objective:  NAD, dysphonia HEENT:  Mild swelling nasal passages with pale blue mucosa, TM's normal, Oroph clear Chest:  Clear Heart:  Reg. Lab Results  Component Value Date   HGBA1C 6.2* 09/25/2013    Results for orders placed in visit on 11/18/13  CBC      Result Value Ref Range   WBC 5.7  4.0 - 10.5 K/uL   RBC 4.79  3.87 - 5.11 MIL/uL   Hemoglobin 14.5  12.0 - 15.0 g/dL   HCT 41.7  36.0 - 46.0 %   MCV 87.1  78.0 - 100.0 fL   MCH 30.3  26.0 - 34.0 pg   MCHC 34.8  30.0 - 36.0 g/dL   RDW 14.4  11.5 - 15.5 %   Platelets 371  150 - 400 K/uL  RENAL FUNCTION PANEL      Result Value Ref Range   Sodium 139  135 - 145 mEq/L   Potassium 4.3  3.5 - 5.3 mEq/L   Chloride 105  96 - 112 mEq/L   CO2 25  19 - 32 mEq/L   Glucose, Bld 89  70 - 99 mg/dL   BUN 11  6 - 23 mg/dL   Creat 1.07  0.50 - 1.10 mg/dL   Albumin 4.3  3.5 - 5.2 g/dL   Calcium 9.7  8.4 - 10.5 mg/dL   Phosphorus 3.6  2.3 - 4.6 mg/dL  HEPATIC FUNCTION PANEL      Result Value Ref Range   Total Bilirubin 0.2  0.2 - 1.2 mg/dL   Bilirubin, Direct <0.1  0.0 - 0.3 mg/dL   Indirect Bilirubin NOT CALC  0.2 - 1.2 mg/dL   Alkaline Phosphatase 68  39 - 117 U/L   AST 20  0 - 37 U/L   ALT 45 (*) 0 - 35 U/L   Total Protein 7.2  6.0 - 8.3 g/dL   Albumin 4.3  3.5 - 5.2 g/dL  SEDIMENTATION RATE      Result Value Ref Range   Sed Rate 18  0 - 22 mm/hr   Assessment:  OSA, recurrent hoarseness, chronic but corrected hypothyroidism.  She seems  somewhat defeated by the chronic nature of her throat and lack of long term strategy for it  Plan: Hoarseness, chronic - Plan: methylPREDNIsolone (MEDROL DOSPACK) 4 MG tablet  OSA (obstructive sleep apnea)  Cough - Plan: benzonatate (TESSALON) 200 MG capsule, methylPREDNIsolone (MEDROL DOSPACK) 4 MG tablet  Kl, md

## 2014-01-14 ENCOUNTER — Encounter: Payer: Self-pay | Admitting: Family Medicine

## 2014-01-14 ENCOUNTER — Encounter: Payer: Self-pay | Admitting: Internal Medicine

## 2014-01-19 ENCOUNTER — Telehealth: Payer: Self-pay

## 2014-01-19 NOTE — Telephone Encounter (Signed)
FMLA paperwork put on md's desk to fill out

## 2014-01-19 NOTE — Telephone Encounter (Signed)
FYI: Lost voice for one day/ seen in urgent care. Has upcoming appt in [place] with Dr Patrice Paradise. Pt missed Wednesday 01-14-14. So needs FMLA for that date and any other dates she may miss. Pt talks all day at work  [employer] is back on [employer]

## 2014-01-20 ENCOUNTER — Ambulatory Visit: Payer: Self-pay | Admitting: Family Medicine

## 2014-01-26 NOTE — Telephone Encounter (Signed)
Received completed paperwork back from Dr. Charlett Blake. Called and informed patient that forms will be faxed today, but unable to speak with patient and was not able to leave message.   Forms faxed to [employer] at [phone].  Copy sent for scanning and original kept for patient. JG//CMA

## 2014-01-27 ENCOUNTER — Encounter: Payer: Self-pay | Admitting: Family Medicine

## 2014-01-30 ENCOUNTER — Ambulatory Visit (INDEPENDENT_AMBULATORY_CARE_PROVIDER_SITE_OTHER): Payer: 59 | Admitting: Internal Medicine

## 2014-01-30 ENCOUNTER — Encounter: Payer: Self-pay | Admitting: Internal Medicine

## 2014-01-30 ENCOUNTER — Other Ambulatory Visit: Payer: Self-pay | Admitting: *Deleted

## 2014-01-30 VITALS — BP 136/82 | HR 76 | Temp 98.4°F | Resp 12 | Wt 244.0 lb

## 2014-01-30 DIAGNOSIS — R490 Dysphonia: Secondary | ICD-10-CM

## 2014-01-30 DIAGNOSIS — E785 Hyperlipidemia, unspecified: Secondary | ICD-10-CM

## 2014-01-30 DIAGNOSIS — E559 Vitamin D deficiency, unspecified: Secondary | ICD-10-CM

## 2014-01-30 DIAGNOSIS — E039 Hypothyroidism, unspecified: Secondary | ICD-10-CM

## 2014-01-30 LAB — LIPID PANEL
Cholesterol: 232 mg/dL — ABNORMAL HIGH (ref 0–200)
HDL: 37.4 mg/dL — ABNORMAL LOW (ref >39.00)
NonHDL: 194.6
Total CHOL/HDL Ratio: 6
Triglycerides: 241 mg/dL — ABNORMAL HIGH (ref 0.0–149.0)
VLDL: 48.2 mg/dL — ABNORMAL HIGH (ref 0.0–40.0)

## 2014-01-30 LAB — LDL CHOLESTEROL, DIRECT: Direct LDL: 160.8 mg/dL

## 2014-01-30 LAB — TSH: TSH: 0.38 u[IU]/mL (ref 0.35–4.50)

## 2014-01-30 LAB — VITAMIN D 25 HYDROXY (VIT D DEFICIENCY, FRACTURES): VITD: 24.83 ng/mL — ABNORMAL LOW (ref 30.00–100.00)

## 2014-01-30 LAB — T4, FREE: Free T4: 0.86 ng/dL (ref 0.60–1.60)

## 2014-01-30 NOTE — Progress Notes (Signed)
Patient ID: [name], female   DOB: 11/16/1960, 53 y.o.   MRN: [id number]   HPI  [name] is a 53 y.o.-year-old female, returning for f/u for hypothyroidism. Last visit 6 mo ago.  Since last visit, she saw Dr. 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 has been dx with hypothyroidism in 2007 (TSH 59; CK then 2471); started on Synthroid, then switched to Armour by Dr [name]; then back on Levothyroxine (but sluggish and tired) so in 09/2012 switched to Armour 90 initially, then increased to 120 mg in 12/2012 >> she finally felt well on this dose.  She takes the thyroid hh: - fasting - with water - separated by >30 min from b'fast  - separated by >4h from Omeprazole and Zegerid (takes both at night), no multivitamins   On Synthroid >> started on 150 mcg >> gradually decreased to 100 mcg daily >> last TSH normal.  I reviewed pt's thyroid tests: Lab Results  Component Value Date   TSH 0.983 10/27/2013   TSH 0.05* 10/21/2013   TSH 0.18* 08/22/2013   TSH 0.062* 07/10/2013   TSH 0.06* 05/26/2013   TSH <0.008* 03/31/2013   TSH 0.11* 01/14/2013   TSH 0.35 12/18/2012   TSH 0.011* 10/01/2012   TSH 0.26* 07/25/2012   FREET4 0.90 10/21/2013   FREET4 0.82 08/22/2013   FREET4 1.44 07/10/2013   FREET4 1.27 05/26/2013   FREET4 1.02 03/31/2013   FREET4 0.63 01/14/2013   FREET4 0.74 12/18/2012   FREET4 1.20 10/01/2012   FREET4 1.37 07/30/2012   FREET4 0.66 12/26/2011    Pt denies feeling nodules in neck, + hoarseness (lost her voice summer 2014), no dysphagia/odynophagia, SOB with lying down.  Pt describes: - no cold intolerance; has hot flushes - no more weight gain - + fatigue - + all: N/V/D/C/reflux - no dry skin - no hair falling  I reviewed her chart and she also has  a history of TAH in 2001, BSO in 2005.   ROS: Constitutional: see HPI Eyes: no blurry vision, no xerophthalmia ENT: no sore throat, no nodules palpated in throat, no dysphagia/odynophagia, + hoarseness Cardiovascular: no CP/SOB/palpitations+ /leg swelling Respiratory: + cough/no SOB Gastrointestinal: + all: N/V/D/C/acid reflux Musculoskeletal: no muscle/joint aches Skin: no rashes, no hair loss Neurological: no tremors/numbness/tingling/dizziness  I reviewed pt's medications, allergies, PMH, social hx, family hx and no changes required, except as mentioned above.  PE: BP 136/82  Pulse 76  Temp(Src) 98.4 F (36.9 C) (Oral)  Resp 12  Wt 244 lb (110.678 kg)  SpO2 96% Wt Readings from Last 3 Encounters:  01/30/14 244 lb (110.678 kg)  01/13/14 241 lb (109.317 kg)  12/16/13 243 lb 0.6 oz (110.242 kg)   Constitutional: obese, lost her voice >> talks softly, in NAD Eyes: PERRLA, EOMI, no exophthalmos ENT: moist mucous membranes, no thyromegaly, no cervical lymphadenopathy Cardiovascular: RRR, No MRG Respiratory: CTA B Gastrointestinal: abdomen soft, NT, ND, BS+ Musculoskeletal: no deformities, strength intact in all 4 Skin: moist, warm, no rashes, but severe acanthosis nigricans - neck, face, hands Neurological: no tremor with outstretched hands, DTR normal in all 4  ASSESSMENT: 1. Hypothyroidism - Thyroid U/S normal 01/2013  PLAN:  1. Patient with long-standing hypothyroidism, on Synthroid therapy, with normalized TSH now - She does  not appear to have a goiter, thyroid nodules, or neck compression symptoms - We discussed about correct intake of thyroid hh, fasting, with water, separated by at least 30 minutes from breakfast, and separated by more than 4 hours from calcium, iron, multivitamins, acid reflux medications (PPIs). She was advised to move the Zegerid in am, but we cannot do this since she is on Levothyroxine as she will not absorb the thyroid hormone without an acidic  environment. I advised her to discuss with her voice doctor if taking Dexilant with lunch and Zegerid with dinner would work. - recent thyroid tests show normal LT4 replacement >> will recheck today >> if normal, continue 100 mcg LT4 daily.  - I will see her back in 6 months  CC: Voice Specialist: Dr [name] ([place])  Office Visit on 01/30/2014  Component Date Value Ref Range Status  . TSH 01/30/2014 0.38  0.35 - 4.50 uIU/mL Final  . Free T4 01/30/2014 0.86  0.60 - 1.60 ng/dL Final  . Cholesterol 01/30/2014 232* 0 - 200 mg/dL Final   ATP III Classification       Desirable:  < 200 mg/dL               Borderline High:  200 - 239 mg/dL          High:  > = 240 mg/dL  . Triglycerides 01/30/2014 241.0* 0.0 - 149.0 mg/dL Final   Normal:  <150 mg/dLBorderline High:  150 - 199 mg/dL  . HDL 01/30/2014 37.40* >39.00 mg/dL Final  . VLDL 01/30/2014 48.2* 0.0 - 40.0 mg/dL Final  . Total CHOL/HDL Ratio 01/30/2014 6   Final                  Men          Women1/2 Average Risk     3.4          3.3Average Risk          5.0          4.42X Average Risk          9.6          7.13X Average Risk          15.0          11.0                      . NonHDL 01/30/2014 194.60   Final   NOTE:  Non-HDL goal should be 30 mg/dL higher than patient's LDL goal (i.e. LDL goal of < 70 mg/dL, would have non-HDL goal of < 100 mg/dL)  . VITD 01/30/2014 24.83* 30.00 - 100.00 ng/mL Final  . Direct LDL 01/30/2014 160.8   Final   Optimal:  <100 mg/dLNear or Above Optimal:  100-129 mg/dLBorderline High:  130-159 mg/dLHigh:  160-189 mg/dLVery High:  >190 mg/dL  Lipids and vit D added per pt's request >> will FWD to PCP. Pt informed of the results.  TFTs normal >> continue Synthroid 100 mcg daily.

## 2014-01-30 NOTE — Patient Instructions (Signed)
Please stop at the lab.  Please come back for a follow-up appointment in 6 months.  

## 2014-02-10 ENCOUNTER — Ambulatory Visit (INDEPENDENT_AMBULATORY_CARE_PROVIDER_SITE_OTHER): Payer: 59 | Admitting: Family Medicine

## 2014-02-10 ENCOUNTER — Encounter: Payer: Self-pay | Admitting: Family Medicine

## 2014-02-10 VITALS — BP 144/88 | HR 87 | Temp 98.4°F | Ht 64.5 in | Wt 244.4 lb

## 2014-02-10 DIAGNOSIS — R109 Unspecified abdominal pain: Secondary | ICD-10-CM

## 2014-02-10 DIAGNOSIS — E785 Hyperlipidemia, unspecified: Secondary | ICD-10-CM

## 2014-02-10 DIAGNOSIS — R609 Edema, unspecified: Secondary | ICD-10-CM

## 2014-02-10 DIAGNOSIS — R490 Dysphonia: Secondary | ICD-10-CM

## 2014-02-10 DIAGNOSIS — Z23 Encounter for immunization: Secondary | ICD-10-CM

## 2014-02-10 DIAGNOSIS — I10 Essential (primary) hypertension: Secondary | ICD-10-CM

## 2014-02-10 DIAGNOSIS — E559 Vitamin D deficiency, unspecified: Secondary | ICD-10-CM

## 2014-02-10 DIAGNOSIS — E669 Obesity, unspecified: Secondary | ICD-10-CM

## 2014-02-10 DIAGNOSIS — E039 Hypothyroidism, unspecified: Secondary | ICD-10-CM

## 2014-02-10 MED ORDER — VITAMIN D (ERGOCALCIFEROL) 1.25 MG (50000 UNIT) PO CAPS: 50000.0000 [IU] | ORAL_CAPSULE | ORAL | Status: DC

## 2014-02-10 MED ORDER — HYOSCYAMINE SULFATE 0.125 MG PO TABS: 0.1250 mg | ORAL_TABLET | ORAL | Status: DC | PRN

## 2014-02-10 NOTE — Progress Notes (Signed)
Pre visit review using our clinic review tool, if applicable. No additional management support is needed unless otherwise documented below in the visit note. 

## 2014-02-10 NOTE — Patient Instructions (Signed)
Cough, Adult  A cough is a reflex that helps clear your throat and airways. It can help heal the body or may be a reaction to an irritated airway. A cough may only last 2 or 3 weeks (acute) or may last more than 8 weeks (chronic).  CAUSES Acute cough:  Viral or bacterial infections. Chronic cough:  Infections.  Allergies.  Asthma.  Post-nasal drip.  Smoking.  Heartburn or acid reflux.  Some medicines.  Chronic lung problems (COPD).  Cancer. SYMPTOMS   Cough.  Fever.  Chest pain.  Increased breathing rate.  High-pitched whistling sound when breathing (wheezing).  Colored mucus that you cough up (sputum). TREATMENT   A bacterial cough may be treated with antibiotic medicine.  A viral cough must run its course and will not respond to antibiotics.  Your caregiver may recommend other treatments if you have a chronic cough. HOME CARE INSTRUCTIONS   Only take over-the-counter or prescription medicines for pain, discomfort, or fever as directed by your caregiver. Use cough suppressants only as directed by your caregiver.  Use a cold steam vaporizer or humidifier in your bedroom or home to help loosen secretions.  Sleep in a semi-upright position if your cough is worse at night.  Rest as needed.  Stop smoking if you smoke. SEEK IMMEDIATE MEDICAL CARE IF:   You have pus in your sputum.  Your cough starts to worsen.  You cannot control your cough with suppressants and are losing sleep.  You begin coughing up blood.  You have difficulty breathing.  You develop pain which is getting worse or is uncontrolled with medicine.  You have a fever. MAKE SURE YOU:   Understand these instructions.  Will watch your condition.  Will get help right away if you are not doing well or get worse. Document Released: 10/07/2010 Document Revised: 07/03/2011 Document Reviewed: 10/07/2010 [employer] Patient Information 2015 [employer]. This information is not intended  to replace advice given to you by your health care provider. Make sure you discuss any questions you have with your health care provider.  

## 2014-02-10 NOTE — Progress Notes (Signed)
Patient ID: [name], female   DOB: 1960-07-14, 53 y.o.   MRN: [id number] JEILY GUTHRIDGE [id number] 10/31/1960 02/10/2014      Progress Note-Follow Up  Subjective  Chief Complaint  Chief Complaint  Patient presents with  . Follow-up    8 week  . Injections    tdap    HPI  Patient is a 53 year old female in today for routine medical care. Continues to struggle with reflux and hoarseness. Work up has revealed nodules and dysphonia. Has noted an increased the past week as well. NO recent illness but is noting increase abdominal pain and cramping, intermittent diarrhea without bloody or tarry stool is noted. Denies CP/palp/SOB/HA/congestion/fevers/GI or GU c/o. Taking meds as prescribed  Past Medical History  Diagnosis Date  . GERD (gastroesophageal reflux disease)   . Hypertension   . Thyroid disease     hypothyroidism  . Anxiety   . Depression   . Hyperlipidemia   . Chronic rhinosinusitis   . OSA (obstructive sleep apnea)   . Obesity   . Colon polyps   . Fibroid, uterine   . Hoarseness   . Glaucoma 12-13  . IBS (irritable bowel syndrome) 02/13/2011  . Perimenopause 10/05/2012  . Edema 01/06/2013  . Diabetes 01/06/2013  . Chronic pain syndrome 01/06/2013  . Cough 01/06/2013  . Hypokalemia 02/05/2013  . Low back pain 03/03/2013  . Hyperglycemia 04/13/2013  . Hoarseness of voice 05/11/2013  . Obesity, unspecified 05/11/2013  . Raynaud disease 06/16/2013    Past Surgical History  Procedure Laterality Date  . Cholecystectomy    . Abdominal hysterectomy      Family History  Problem Relation Age of Onset  . Alcohol abuse Father   . Cancer Father     renal and colon  . Hyperlipidemia Father   . Hypertension Father   . Diabetes    . Cancer Paternal Grandfather     colon    History   Social History  . Marital Status: Single    Spouse Name: N/A    Number of Children: N/A  . Years of Education: N/A   Occupational History  . Not on file.   Social History  Main Topics  . Smoking status: Former Smoker -- 0.50 packs/day for 30 years    Types: Cigarettes    Quit date: 04/25/2003  . Smokeless tobacco: Never Used     Comment: smoked since age 3  . Alcohol Use: No  . Drug Use: Not on file  . Sexual Activity: Not on file   Other Topics Concern  . Not on file   Social History Narrative   Endo-- Dr Dwyane Dee   ENT--Dr shoemaker   GI--Dr Buccini   Pulm--Dr Clance   Rheum--Dr Charlestine Night          Current Outpatient Prescriptions on File Prior to Visit  Medication Sig Dispense Refill  . ALPRAZolam (XANAX) 0.25 MG tablet Take 1 tablet (0.25 mg total) by mouth 2 (two) times daily as needed for sleep.  20 tablet  0  . aspirin EC 81 MG tablet Take 1 tablet (81 mg total) by mouth daily. Aspirin only causes GI upset  30 tablet  3  . BYSTOLIC 5 MG tablet TAKE 1 TABLET BY MOUTH DAILY  30 tablet  3  . cholecalciferol (VITAMIN D-400) 400 UNITS TABS Take 400 Units by mouth daily.      . clotrimazole (LOTRIMIN) 1 % cream Apply topically 2 (two) times daily.  [place]  g  1  . dexlansoprazole (DEXILANT) 60 MG capsule Take 1 capsule (60 mg total) by mouth daily.  30 capsule    . diphenhydrAMINE (BENADRYL) 25 MG tablet Take 25 mg by mouth at bedtime as needed.        Marland Kitchen estradiol (ESTRACE) 1 MG tablet Take 1 mg by mouth daily.        Marland Kitchen FLUoxetine (PROZAC) 20 MG tablet Take 1 tablet (20 mg total) by mouth daily.  30 tablet  3  . furosemide (LASIX) 20 MG tablet Take 1-2 tablets (20-40 mg total) by mouth daily as needed for fluid or edema.  40 tablet  3  . glucose blood (TRUE CARE TEST STRIP PACK) test strip Use as instructed  100 each  3  . hyoscyamine (LEVSIN SL) 0.125 MG SL tablet Place 1 tablet (0.125 mg total) under the tongue every 4 (four) hours as needed.  40 tablet  1  . latanoprost (XALATAN) 0.005 % ophthalmic solution Place 1 drop into both eyes at bedtime.      Marland Kitchen losartan (COZAAR) 50 MG tablet Take 1 tablet (50 mg total) by mouth daily.  90 tablet  3  .  metFORMIN (GLUCOPHAGE-XR) 500 MG 24 hr tablet Take 1 tablet (500 mg total) by mouth daily with breakfast.  30 tablet  3  . methylPREDNIsolone (MEDROL DOSPACK) 4 MG tablet follow package directions  21 tablet  0  . omeprazole-sodium bicarbonate (ZEGERID) 40-1100 MG per capsule Take 1 capsule by mouth daily.  30 capsule  4  . spironolactone (ALDACTONE) 25 MG tablet TAKE 1 TABLET BY MOUTH 2 TIMES DAILY  60 tablet  1  . SYNTHROID 100 MCG tablet Take 1 tablet (100 mcg total) by mouth daily before breakfast.  60 tablet  2  . TRUEPLUS LANCETS 30G MISC 1 Device by Does not apply route daily as needed. DX 250.00 by Does not apply route daily as needed. DX 250.00  100 each  2  . venlafaxine XR (EFFEXOR XR) 75 MG 24 hr capsule Take 1 capsule (75 mg total) by mouth daily with breakfast.  30 capsule  3   No current facility-administered medications on file prior to visit.    Allergies  Allergen Reactions  . Aspirin     REACTION: Upset stomach  . Amoxicillin Rash    Review of Systems  Review of Systems  Constitutional: Negative for fever and malaise/fatigue.  HENT: Negative for congestion.   Eyes: Negative for discharge.  Respiratory: Negative for shortness of breath.   Cardiovascular: Negative for chest pain, palpitations and leg swelling.  Gastrointestinal: Negative for nausea, abdominal pain and diarrhea.  Genitourinary: Negative for dysuria.  Musculoskeletal: Negative for falls.  Skin: Negative for rash.  Neurological: Negative for loss of consciousness and headaches.  Endo/Heme/Allergies: Negative for polydipsia.  Psychiatric/Behavioral: Negative for depression and suicidal ideas. The patient is not nervous/anxious and does not have insomnia.     Objective  BP 144/88  Pulse 87  Temp(Src) 98.4 F (36.9 C) (Oral)  Ht 5' 4.5" (1.638 m)  Wt 244 lb 6.4 oz (110.859 kg)  BMI 41.32 kg/m2  SpO2 100%  Physical Exam  Physical Exam  Constitutional: She is oriented to person, place, and  time and well-developed, well-nourished, and in no distress. No distress.  HENT:  Head: Normocephalic and atraumatic.  Eyes: Conjunctivae are normal.  Neck: Neck supple. No thyromegaly present.  Cardiovascular: Normal rate, regular rhythm and normal heart sounds.   No murmur heard. Pulmonary/Chest:  Effort normal and breath sounds normal. She has no wheezes.  Abdominal: She exhibits no distension and no mass.  Musculoskeletal: She exhibits no edema.  Lymphadenopathy:    She has no cervical adenopathy.  Neurological: She is alert and oriented to person, place, and time.  Skin: Skin is warm and dry. No rash noted. She is not diaphoretic.  Psychiatric: Memory, affect and judgment normal.    Lab Results  Component Value Date   TSH 0.38 01/30/2014   Lab Results  Component Value Date   WBC 5.7 11/18/2013   HGB 14.5 11/18/2013   HCT 41.7 11/18/2013   MCV 87.1 11/18/2013   PLT 371 11/18/2013   Lab Results  Component Value Date   CREATININE 1.07 11/18/2013   BUN 11 11/18/2013   NA 139 11/18/2013   K 4.3 11/18/2013   CL 105 11/18/2013   CO2 25 11/18/2013   Lab Results  Component Value Date   ALT 45* 11/18/2013   AST 20 11/18/2013   ALKPHOS 68 11/18/2013   BILITOT 0.2 11/18/2013   Lab Results  Component Value Date   CHOL 232* 01/30/2014   Lab Results  Component Value Date   HDL 37.40* 01/30/2014   Lab Results  Component Value Date   LDLCALC 115* 09/25/2013   Lab Results  Component Value Date   TRIG 241.0* 01/30/2014   Lab Results  Component Value Date   CHOLHDL 6 01/30/2014     Assessment & Plan  Hypothyroidism On Levothyroxine, continue to monitor  Obesity Encouraged DASH diet, decrease po intake and increase exercise as tolerated. Needs 7-8 hours of sleep nightly. Avoid trans fats, eat small, frequent meals every 4-5 hours with lean proteins, complex carbs and healthy fats. Minimize simple carbs, GMO foods.  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 Furosemide prn  Hyperlipemia Encouraged heart healthy diet, increase exercise, avoid trans fats, consider a krill oil cap daily  Essential hypertension Denies CP/palp/SOB/HA/congestion/fevers/GI or GU c/o. Taking meds as prescribed  Vitamin D deficiency Will need hi dose supplements.

## 2014-02-15 DIAGNOSIS — R109 Unspecified abdominal pain: Secondary | ICD-10-CM | POA: Insufficient documentation

## 2014-02-15 NOTE — Assessment & Plan Note (Signed)
Minimize sodium, elevate feet, use compression hose prn. Furosemide prn

## 2014-02-15 NOTE — Assessment & Plan Note (Signed)
On Levothyroxine, continue to monitor 

## 2014-02-15 NOTE — Assessment & Plan Note (Signed)
Encouraged DASH diet, decrease po intake and increase exercise as tolerated. Needs 7-8 hours of sleep nightly. Avoid trans fats, eat small, frequent meals every 4-5 hours with lean proteins, complex carbs and healthy fats. Minimize simple carbs, GMO foods. 

## 2014-02-15 NOTE — Assessment & Plan Note (Signed)
Denies CP/palp/SOB/HA/congestion/fevers/GI or GU c/o. Taking meds as prescribed 

## 2014-02-15 NOTE — Assessment & Plan Note (Signed)
Encouraged heart healthy diet, increase exercise, avoid trans fats, consider a krill oil cap daily 

## 2014-02-15 NOTE — Assessment & Plan Note (Signed)
Will need hi dose supplements.

## 2014-02-15 NOTE — Assessment & Plan Note (Signed)
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

## 2014-02-23 ENCOUNTER — Other Ambulatory Visit: Payer: Self-pay | Admitting: Family Medicine

## 2014-02-23 DIAGNOSIS — M7989 Other specified soft tissue disorders: Secondary | ICD-10-CM | POA: Insufficient documentation

## 2014-03-16 ENCOUNTER — Other Ambulatory Visit: Payer: Self-pay | Admitting: Family Medicine

## 2014-03-16 NOTE — Telephone Encounter (Signed)
Rx request to pharmacy/SLS  

## 2014-04-10 ENCOUNTER — Other Ambulatory Visit: Payer: Self-pay | Admitting: Family Medicine

## 2014-04-10 ENCOUNTER — Other Ambulatory Visit: Payer: Self-pay

## 2014-04-14 ENCOUNTER — Telehealth: Payer: Self-pay | Admitting: *Deleted

## 2014-04-14 DIAGNOSIS — F329 Major depressive disorder, single episode, unspecified: Secondary | ICD-10-CM

## 2014-04-14 DIAGNOSIS — M5442 Lumbago with sciatica, left side: Secondary | ICD-10-CM

## 2014-04-14 DIAGNOSIS — F4323 Adjustment disorder with mixed anxiety and depressed mood: Secondary | ICD-10-CM

## 2014-04-14 DIAGNOSIS — IMO0001 Reserved for inherently not codable concepts without codable children: Secondary | ICD-10-CM

## 2014-04-14 DIAGNOSIS — R630 Anorexia: Secondary | ICD-10-CM

## 2014-04-14 DIAGNOSIS — F419 Anxiety disorder, unspecified: Secondary | ICD-10-CM

## 2014-04-14 DIAGNOSIS — M542 Cervicalgia: Secondary | ICD-10-CM

## 2014-04-14 DIAGNOSIS — M5441 Lumbago with sciatica, right side: Secondary | ICD-10-CM

## 2014-04-14 DIAGNOSIS — F32A Depression, unspecified: Secondary | ICD-10-CM

## 2014-04-14 MED ORDER — VENLAFAXINE HCL ER 75 MG PO CP24: 75.0000 mg | ORAL_CAPSULE | Freq: Every day | ORAL | Status: DC

## 2014-04-14 NOTE — Telephone Encounter (Signed)
Received fax from [facility] requesting refill of venlafaxine hcl ER 75mg  once a day. Pt has f/u in 04/2014.  Refill sent.

## 2014-04-23 ENCOUNTER — Telehealth: Payer: Self-pay | Admitting: Internal Medicine

## 2014-04-23 MED ORDER — SYNTHROID 100 MCG PO TABS: 100.0000 ug | ORAL_TABLET | Freq: Every day | ORAL | Status: DC

## 2014-04-23 NOTE — Telephone Encounter (Signed)
Patient need refill of Synthroid 100 mg

## 2014-04-23 NOTE — Telephone Encounter (Signed)
Done

## 2014-04-27 ENCOUNTER — Other Ambulatory Visit: Payer: Self-pay | Admitting: Obstetrics and Gynecology

## 2014-04-29 LAB — CYTOLOGY - PAP

## 2014-04-29 LAB — HM MAMMOGRAPHY

## 2014-04-30 ENCOUNTER — Other Ambulatory Visit: Payer: Self-pay | Admitting: Family Medicine

## 2014-05-19 ENCOUNTER — Other Ambulatory Visit: Payer: Self-pay | Admitting: Family Medicine

## 2014-05-22 ENCOUNTER — Encounter: Payer: Self-pay | Admitting: Family Medicine

## 2014-05-22 ENCOUNTER — Ambulatory Visit (INDEPENDENT_AMBULATORY_CARE_PROVIDER_SITE_OTHER): Payer: 59 | Admitting: Family Medicine

## 2014-05-22 VITALS — BP 152/91 | HR 82 | Temp 98.5°F | Ht 64.5 in | Wt 243.6 lb

## 2014-05-22 DIAGNOSIS — I10 Essential (primary) hypertension: Secondary | ICD-10-CM

## 2014-05-22 DIAGNOSIS — R05 Cough: Secondary | ICD-10-CM

## 2014-05-22 DIAGNOSIS — R490 Dysphonia: Secondary | ICD-10-CM

## 2014-05-22 DIAGNOSIS — F4323 Adjustment disorder with mixed anxiety and depressed mood: Secondary | ICD-10-CM

## 2014-05-22 DIAGNOSIS — Z23 Encounter for immunization: Secondary | ICD-10-CM

## 2014-05-22 DIAGNOSIS — R059 Cough, unspecified: Secondary | ICD-10-CM

## 2014-05-22 DIAGNOSIS — E669 Obesity, unspecified: Secondary | ICD-10-CM

## 2014-05-22 DIAGNOSIS — E1169 Type 2 diabetes mellitus with other specified complication: Secondary | ICD-10-CM

## 2014-05-22 DIAGNOSIS — E039 Hypothyroidism, unspecified: Secondary | ICD-10-CM

## 2014-05-22 DIAGNOSIS — E782 Mixed hyperlipidemia: Secondary | ICD-10-CM

## 2014-05-22 DIAGNOSIS — K219 Gastro-esophageal reflux disease without esophagitis: Secondary | ICD-10-CM

## 2014-05-22 DIAGNOSIS — E119 Type 2 diabetes mellitus without complications: Secondary | ICD-10-CM

## 2014-05-22 DIAGNOSIS — E559 Vitamin D deficiency, unspecified: Secondary | ICD-10-CM

## 2014-05-22 DIAGNOSIS — R252 Cramp and spasm: Secondary | ICD-10-CM | POA: Insufficient documentation

## 2014-05-22 HISTORY — DX: Cramp and spasm: R25.2

## 2014-05-22 LAB — COMPREHENSIVE METABOLIC PANEL
ALT: 23 U/L (ref 0–35)
AST: 33 U/L (ref 0–37)
Albumin: 4.2 g/dL (ref 3.5–5.2)
Alkaline Phosphatase: 74 U/L (ref 39–117)
BUN: 9 mg/dL (ref 6–23)
CO2: 25 mEq/L (ref 19–32)
Calcium: 9.4 mg/dL (ref 8.4–10.5)
Chloride: 103 mEq/L (ref 96–112)
Creatinine, Ser: 0.97 mg/dL (ref 0.40–1.20)
GFR: 77.12 mL/min (ref >60.00)
Glucose, Bld: 79 mg/dL (ref 70–99)
Potassium: 3.7 mEq/L (ref 3.5–5.1)
Sodium: 137 mEq/L (ref 135–145)
Total Bilirubin: 0.3 mg/dL (ref 0.2–1.2)
Total Protein: 7.2 g/dL (ref 6.0–8.3)

## 2014-05-22 LAB — VITAMIN D 25 HYDROXY (VIT D DEFICIENCY, FRACTURES): VITD: 34.18 ng/mL (ref 30.00–100.00)

## 2014-05-22 LAB — CBC WITH DIFFERENTIAL/PLATELET
Basophils Absolute: 0 10*3/uL (ref 0.0–0.1)
Basophils Relative: 0.4 % (ref 0.0–3.0)
Eosinophils Absolute: 0.1 10*3/uL (ref 0.0–0.7)
Eosinophils Relative: 2 % (ref 0.0–5.0)
HCT: 39.4 % (ref 36.0–46.0)
Hemoglobin: 13.4 g/dL (ref 12.0–15.0)
Lymphocytes Relative: 43.5 % (ref 12.0–46.0)
Lymphs Abs: 2 10*3/uL (ref 0.7–4.0)
MCHC: 33.9 g/dL (ref 30.0–36.0)
MCV: 87.9 fl (ref 78.0–100.0)
Monocytes Absolute: 0.5 10*3/uL (ref 0.1–1.0)
Monocytes Relative: 10.5 % (ref 3.0–12.0)
Neutro Abs: 2 10*3/uL (ref 1.4–7.7)
Neutrophils Relative %: 43.6 % (ref 43.0–77.0)
Platelets: 320 10*3/uL (ref 150.0–400.0)
RBC: 4.48 Mil/uL (ref 3.87–5.11)
RDW: 14 % (ref 11.5–15.5)
WBC: 4.5 10*3/uL (ref 4.0–10.5)

## 2014-05-22 LAB — MAGNESIUM: Magnesium: 2 mg/dL (ref 1.5–2.5)

## 2014-05-22 LAB — LIPID PANEL
Cholesterol: 231 mg/dL — ABNORMAL HIGH (ref 0–200)
HDL: 36.4 mg/dL — ABNORMAL LOW (ref >39.00)
LDL Cholesterol: 159 mg/dL — ABNORMAL HIGH (ref 0–99)
NonHDL: 194.6
Total CHOL/HDL Ratio: 6
Triglycerides: 176 mg/dL — ABNORMAL HIGH (ref 0.0–149.0)
VLDL: 35.2 mg/dL (ref 0.0–40.0)

## 2014-05-22 LAB — HEMOGLOBIN A1C: Hgb A1c MFr Bld: 6.4 % (ref 4.6–6.5)

## 2014-05-22 LAB — TSH: TSH: 1.13 u[IU]/mL (ref 0.35–4.50)

## 2014-05-22 MED ORDER — GABAPENTIN 300 MG PO CAPS: 900.0000 mg | ORAL_CAPSULE | Freq: Every day | ORAL | Status: DC

## 2014-05-22 MED ORDER — NEBIVOLOL HCL 10 MG PO TABS: 10.0000 mg | ORAL_TABLET | Freq: Every day | ORAL | Status: DC

## 2014-05-22 NOTE — Assessment & Plan Note (Signed)
Will check vitamin D today

## 2014-05-22 NOTE — Assessment & Plan Note (Signed)
Recent scopes and PH probes normal at [facility] per patient so she stopped the reflux meds and no new symptoms

## 2014-05-22 NOTE — Assessment & Plan Note (Signed)
Encouraged DASH diet, decrease po intake and increase exercise as tolerated. Needs 7-8 hours of sleep nightly. Avoid trans fats, eat small, frequent meals every 4-5 hours with lean proteins, complex carbs and healthy fats. Minimize simple carbs, GMO foods. 

## 2014-05-22 NOTE — Assessment & Plan Note (Addendum)
Stop Losartan due to cough Encouraged heart healthy diet such as the DASH diet and exercise as tolerated. Increase Bystolic

## 2014-05-22 NOTE — Assessment & Plan Note (Signed)
On Levothyroxine, continue to monitor 

## 2014-05-22 NOTE — Assessment & Plan Note (Signed)
Notes increased leg cramps lately. Will check magesium level today, may need to consider a supplement

## 2014-05-22 NOTE — Patient Instructions (Signed)

## 2014-05-22 NOTE — Progress Notes (Signed)
Pre visit review using our clinic review tool, if applicable. No additional management support is needed unless otherwise documented below in the visit note. 

## 2014-05-22 NOTE — Assessment & Plan Note (Signed)
Encouraged heart healthy diet, increase exercise, avoid trans fats, consider a krill oil cap daily 

## 2014-05-25 ENCOUNTER — Encounter: Payer: Self-pay | Admitting: Family Medicine

## 2014-05-26 ENCOUNTER — Other Ambulatory Visit: Payer: Self-pay | Admitting: Family

## 2014-05-26 ENCOUNTER — Encounter: Payer: Self-pay | Admitting: Family Medicine

## 2014-05-27 ENCOUNTER — Other Ambulatory Visit: Payer: Self-pay | Admitting: Family Medicine

## 2014-05-27 ENCOUNTER — Telehealth: Payer: Self-pay | Admitting: Family Medicine

## 2014-05-27 MED ORDER — FENOFIBRATE 120 MG PO TABS: 1.0000 | ORAL_TABLET | Freq: Every day | ORAL | Status: DC

## 2014-05-27 MED ORDER — FENOFIBRATE MICRONIZED 130 MG PO CAPS: 130.0000 mg | ORAL_CAPSULE | Freq: Every day | ORAL | Status: DC

## 2014-05-27 NOTE — Telephone Encounter (Signed)
New Rx sent via escript.

## 2014-05-27 NOTE — Telephone Encounter (Signed)
OK to switch Fenofibrate to 130 mg tab, sig: 1 tab po daily

## 2014-05-27 NOTE — Telephone Encounter (Signed)
Caller name: betty howard from [facility] outpatient pharmacy  Relation to pt: Call back number: [phone] Pharmacy:  Reason for call:   Inez Catalina from [employer] called regarding fenofibrate refill that was sent in this morning. Inez Catalina states that fenofibrate doesn't come in 120mg  but does come in 130mg .

## 2014-05-31 ENCOUNTER — Encounter: Payer: Self-pay | Admitting: Family Medicine

## 2014-05-31 ENCOUNTER — Other Ambulatory Visit: Payer: Self-pay | Admitting: Family Medicine

## 2014-05-31 MED ORDER — VENLAFAXINE HCL ER 150 MG PO TB24: 150.0000 mg | ORAL_TABLET | Freq: Every day | ORAL | Status: DC

## 2014-05-31 NOTE — Assessment & Plan Note (Signed)
hgba1c acceptable, minimize simple carbs. Increase exercise as tolerated. Continue current meds 

## 2014-05-31 NOTE — Assessment & Plan Note (Signed)
Has vocal cord nodules and is undergoing voice therapy and resting her voice

## 2014-05-31 NOTE — Progress Notes (Signed)
[name]  [id number] 02/23/1961 05/31/2014      Progress Note-Follow Up  Subjective  Chief Complaint  Chief Complaint  Patient presents with  . Follow-up    3 mos    HPI  Patient is a 54 y.o. female in today for routine medical care. Patient continues to follow with specialist at [facility] for her hoarseness and cough. She is seeing Dr. Patrice Paradise of ENT and they have her on vocal rest and local therapy. No other recent illness or concerns. Continues to struggle with fatigue and myalgias. Denies CP/palp/SOB/HA/congestion/fevers/GI or GU c/o. Taking meds as prescribed. Recent PH probe negative for reflux.   Past Medical History  Diagnosis Date  . GERD (gastroesophageal reflux disease)   . Hypertension   . Thyroid disease     hypothyroidism  . Anxiety   . Depression   . Hyperlipidemia   . Chronic rhinosinusitis   . OSA (obstructive sleep apnea)   . Obesity   . Colon polyps   . Fibroid, uterine   . Hoarseness   . Glaucoma 12-13  . IBS (irritable bowel syndrome) 02/13/2011  . Perimenopause 10/05/2012  . Edema 01/06/2013  . Diabetes 01/06/2013  . Chronic pain syndrome 01/06/2013  . Cough 01/06/2013  . Hypokalemia 02/05/2013  . Low back pain 03/03/2013  . Hyperglycemia 04/13/2013  . Hoarseness of voice 05/11/2013  . Obesity, unspecified 05/11/2013  . Raynaud disease 06/16/2013  . Hyperlipemia, mixed 06/06/2007    Qualifier: Diagnosis of  By: Wynona Luna She feels Lipitor caused increased low back pain and weakness    . Muscle cramp 05/22/2014  . Diabetes mellitus type 2 in obese 01/06/2013    Sees Dr Calvert Cantor for eye exam Does not see podiatry, foot exam today unremarkable except for thick cracking skin on heals     Past Surgical History  Procedure Laterality Date  . Cholecystectomy    . Abdominal hysterectomy      Family History  Problem Relation Age of Onset  . Alcohol abuse Father   . Cancer Father     renal and colon  . Hyperlipidemia Father   .  Hypertension Father   . Diabetes    . Cancer Paternal Grandfather     colon    History   Social History  . Marital Status: Single    Spouse Name: N/A    Number of Children: N/A  . Years of Education: N/A   Occupational History  . Not on file.   Social History Main Topics  . Smoking status: Former Smoker -- 0.50 packs/day for 30 years    Types: Cigarettes    Quit date: 04/25/2003  . Smokeless tobacco: Never Used     Comment: smoked since age 20  . Alcohol Use: No  . Drug Use: Not on file  . Sexual Activity: Not on file   Other Topics Concern  . Not on file   Social History Narrative   Endo-- Dr Dwyane Dee   ENT--Dr shoemaker   GI--Dr Buccini   Pulm--Dr Clance   Rheum--Dr Charlestine Night          Current Outpatient Prescriptions on File Prior to Visit  Medication Sig Dispense Refill  . clotrimazole (LOTRIMIN) 1 % cream Apply topically 2 (two) times daily. 60 g 1  . FLUoxetine (PROZAC) 20 MG capsule TAKE 1 TABLET (20 MG TOTAL) BY MOUTH DAILY. 30 capsule 3  . furosemide (LASIX) 20 MG tablet TAKE 1-2 TABLETS BY MOUTH DAILY AS NEEDED  FOR FLUID OR EDEMA 40 tablet 2  . glucose blood (TRUE CARE TEST STRIP PACK) test strip Use as instructed 100 each 3  . hyoscyamine (LEVSIN, ANASPAZ) 0.125 MG tablet Take 1 tablet (0.125 mg total) by mouth every 4 (four) hours as needed for cramping. 40 tablet 1  . latanoprost (XALATAN) 0.005 % ophthalmic solution Place 1 drop into both eyes at bedtime.    . metFORMIN (GLUCOPHAGE-XR) 500 MG 24 hr tablet Take 1 tablet (500 mg total) by mouth daily with breakfast. 30 tablet 3  . spironolactone (ALDACTONE) 25 MG tablet TAKE 1 TABLET BY MOUTH TWICE DAILY 60 tablet 0  . SYNTHROID 100 MCG tablet Take 1 tablet (100 mcg total) by mouth daily before breakfast. 60 tablet 2  . TRUEPLUS LANCETS 30G MISC 1 Device by Does not apply route daily as needed. DX 250.00 by Does not apply route daily as needed. DX 250.00 100 each 2  . venlafaxine XR (EFFEXOR XR) 75 MG 24 hr  capsule Take 1 capsule (75 mg total) by mouth daily with breakfast. 30 capsule 2  . Vitamin D, Ergocalciferol, (DRISDOL) 50000 UNITS CAPS capsule Take 1 capsule (50,000 Units total) by mouth every 7 (seven) days. 4 capsule 4   No current facility-administered medications on file prior to visit.    Allergies  Allergen Reactions  . Losartan Cough  . Aspirin     REACTION: Upset stomach  . Codeine   . Amoxicillin Rash    Review of Systems  Review of Systems  Constitutional: Positive for malaise/fatigue. Negative for fever.  HENT: Positive for congestion.   Eyes: Negative for discharge.  Respiratory: Positive for cough. Negative for shortness of breath.   Cardiovascular: Negative for chest pain, palpitations and leg swelling.  Gastrointestinal: Negative for nausea, abdominal pain and diarrhea.  Genitourinary: Negative for dysuria.  Musculoskeletal: Positive for myalgias. Negative for falls.  Skin: Negative for rash.  Neurological: Negative for loss of consciousness and headaches.  Endo/Heme/Allergies: Negative for polydipsia.  Psychiatric/Behavioral: Negative for depression and suicidal ideas. The patient is not nervous/anxious and does not have insomnia.     Objective  BP 152/91 mmHg  Pulse 82  Temp(Src) 98.5 F (36.9 C) (Oral)  Ht 5' 4.5" (1.638 m)  Wt 243 lb 9.6 oz (110.496 kg)  BMI 41.18 kg/m2  SpO2 95%  Physical Exam  Physical Exam  Constitutional: She is oriented to person, place, and time and well-developed, well-nourished, and in no distress. No distress.  HENT:  Head: Normocephalic and atraumatic.  Eyes: Conjunctivae are normal.  Neck: Neck supple. No thyromegaly present.  Cardiovascular: Normal rate, regular rhythm and normal heart sounds.   No murmur heard. Pulmonary/Chest: Effort normal and breath sounds normal. She has no wheezes.  Abdominal: She exhibits no distension and no mass.  Musculoskeletal: She exhibits no edema.  Lymphadenopathy:    She has  no cervical adenopathy.  Neurological: She is alert and oriented to person, place, and time.  Skin: Skin is warm and dry. No rash noted. She is not diaphoretic.  Psychiatric: Memory, affect and judgment normal.    Lab Results  Component Value Date   TSH 1.13 05/22/2014   Lab Results  Component Value Date   WBC 4.5 05/22/2014   HGB 13.4 05/22/2014   HCT 39.4 05/22/2014   MCV 87.9 05/22/2014   PLT 320.0 05/22/2014   Lab Results  Component Value Date   CREATININE 0.97 05/22/2014   BUN 9 05/22/2014   NA 137 05/22/2014  K 3.7 05/22/2014   CL 103 05/22/2014   CO2 25 05/22/2014   Lab Results  Component Value Date   ALT 23 05/22/2014   AST 33 05/22/2014   ALKPHOS 74 05/22/2014   BILITOT 0.3 05/22/2014   Lab Results  Component Value Date   CHOL 231* 05/22/2014   Lab Results  Component Value Date   HDL 36.40* 05/22/2014   Lab Results  Component Value Date   LDLCALC 159* 05/22/2014   Lab Results  Component Value Date   TRIG 176.0* 05/22/2014   Lab Results  Component Value Date   CHOLHDL 6 05/22/2014     Assessment & Plan  Essential hypertension Stop Losartan due to cough Encouraged heart healthy diet such as the DASH diet and exercise as tolerated. Increase Bystolic   Hyperlipemia, mixed Encouraged heart healthy diet, increase exercise, avoid trans fats, consider a krill oil cap daily   Obesity Encouraged DASH diet, decrease po intake and increase exercise as tolerated. Needs 7-8 hours of sleep nightly. Avoid trans fats, eat small, frequent meals every 4-5 hours with lean proteins, complex carbs and healthy fats. Minimize simple carbs, GMO foods.   Vitamin D deficiency Will check vitamin D today   GERD Recent scopes and PH probes normal at [facility] per patient so she stopped the reflux meds and no new symptoms   Hypothyroidism On Levothyroxine, continue to monitor   Muscle cramp Notes increased leg cramps lately. Will check magesium level today,  may need to consider a supplement   Diabetes mellitus type 2 in obese [id number] acceptable, minimize simple carbs. Increase exercise as tolerated. Continue current meds   Cough Discontinued Losartan and cough has improved per patient message   Hoarseness of voice Has vocal cord nodules and is undergoing voice therapy and resting her voice    ADJ DISORDER WITH MIXED ANXIETY & DEPRESSED MOOD Will increase Venlafaxine ER to 150 mg at her request for ongoing stressors.

## 2014-05-31 NOTE — Assessment & Plan Note (Signed)
Will increase Venlafaxine ER to 150 mg at her request for ongoing stressors.

## 2014-05-31 NOTE — Assessment & Plan Note (Signed)
Discontinued Losartan and cough has improved per patient message

## 2014-06-04 ENCOUNTER — Other Ambulatory Visit: Payer: Self-pay | Admitting: Family Medicine

## 2014-06-15 ENCOUNTER — Other Ambulatory Visit: Payer: Self-pay | Admitting: Family Medicine

## 2014-06-15 ENCOUNTER — Encounter: Payer: Self-pay | Admitting: Family Medicine

## 2014-06-15 MED ORDER — NEBIVOLOL HCL 10 MG PO TABS: 10.0000 mg | ORAL_TABLET | Freq: Every day | ORAL | Status: DC

## 2014-06-16 ENCOUNTER — Other Ambulatory Visit: Payer: Self-pay | Admitting: Family Medicine

## 2014-06-16 MED ORDER — NEBIVOLOL HCL 10 MG PO TABS: 20.0000 mg | ORAL_TABLET | Freq: Every day | ORAL | Status: DC

## 2014-06-17 ENCOUNTER — Other Ambulatory Visit: Payer: Self-pay | Admitting: Family Medicine

## 2014-06-18 ENCOUNTER — Other Ambulatory Visit: Payer: Self-pay | Admitting: Family Medicine

## 2014-06-18 MED ORDER — SPIRONOLACTONE 25 MG PO TABS: 25.0000 mg | ORAL_TABLET | Freq: Two times a day (BID) | ORAL | Status: DC

## 2014-06-18 NOTE — Telephone Encounter (Signed)
Spironolactone refilled per protocol. JG//CMA 

## 2014-06-18 NOTE — Telephone Encounter (Signed)
Rx sent to the pharmacy by e-script.//AB/CMA 

## 2014-06-24 ENCOUNTER — Other Ambulatory Visit: Payer: Self-pay | Admitting: Family Medicine

## 2014-06-24 NOTE — Telephone Encounter (Signed)
furosemide (LASIX) 20 MG tablet Last filled: 04/30/14 Amt: 40, 2 refills  Vitamin D, Ergocalciferol, (DRISDOL) 50000 UNITS CAPS  Last filled: 02/10/14 Amt: 4, 4 refills  Last OV:  05/22/14   Med filled.

## 2014-07-10 ENCOUNTER — Encounter: Payer: Self-pay | Admitting: Family Medicine

## 2014-07-10 ENCOUNTER — Ambulatory Visit (INDEPENDENT_AMBULATORY_CARE_PROVIDER_SITE_OTHER): Payer: 59 | Admitting: Family Medicine

## 2014-07-10 VITALS — BP 120/80 | HR 82 | Temp 98.2°F | Ht 64.5 in | Wt 242.1 lb

## 2014-07-10 DIAGNOSIS — E669 Obesity, unspecified: Secondary | ICD-10-CM

## 2014-07-10 DIAGNOSIS — K219 Gastro-esophageal reflux disease without esophagitis: Secondary | ICD-10-CM

## 2014-07-10 DIAGNOSIS — F411 Generalized anxiety disorder: Secondary | ICD-10-CM

## 2014-07-10 DIAGNOSIS — E119 Type 2 diabetes mellitus without complications: Secondary | ICD-10-CM

## 2014-07-10 DIAGNOSIS — E039 Hypothyroidism, unspecified: Secondary | ICD-10-CM

## 2014-07-10 DIAGNOSIS — E782 Mixed hyperlipidemia: Secondary | ICD-10-CM

## 2014-07-10 DIAGNOSIS — I10 Essential (primary) hypertension: Secondary | ICD-10-CM | POA: Diagnosis not present

## 2014-07-10 DIAGNOSIS — R490 Dysphonia: Secondary | ICD-10-CM

## 2014-07-10 DIAGNOSIS — E1169 Type 2 diabetes mellitus with other specified complication: Secondary | ICD-10-CM

## 2014-07-10 DIAGNOSIS — F4323 Adjustment disorder with mixed anxiety and depressed mood: Secondary | ICD-10-CM

## 2014-07-10 MED ORDER — VENLAFAXINE HCL ER 75 MG PO CP24: 75.0000 mg | ORAL_CAPSULE | Freq: Every day | ORAL | Status: DC

## 2014-07-10 MED ORDER — ALPRAZOLAM 0.25 MG PO TABS: ORAL_TABLET | ORAL | Status: DC

## 2014-07-10 MED ORDER — FLUOXETINE HCL 20 MG PO CAPS: 40.0000 mg | ORAL_CAPSULE | Freq: Every day | ORAL | Status: DC

## 2014-07-10 NOTE — Progress Notes (Signed)
Pre visit review using our clinic review tool, if applicable. No additional management support is needed unless otherwise documented below in the visit note. 

## 2014-07-10 NOTE — Patient Instructions (Signed)

## 2014-07-23 NOTE — Assessment & Plan Note (Signed)
Well controlled, no changes to meds. Encouraged heart healthy diet such as the DASH diet and exercise as tolerated.  °

## 2014-07-23 NOTE — Assessment & Plan Note (Signed)
Very sad about her mother passing away on 06/18/14. No changes today

## 2014-07-23 NOTE — Assessment & Plan Note (Signed)
Avoid offending foods, start probiotics. Do not eat large meals in late evening and consider raising head of bed.  

## 2014-07-23 NOTE — Assessment & Plan Note (Signed)
Continues to follow with Dr Patrice Paradise at [facility]. Is slowly improving, cause is multifactorial

## 2014-07-23 NOTE — Assessment & Plan Note (Signed)
On Levothyroxine, continue to monitor 

## 2014-07-31 ENCOUNTER — Encounter: Payer: Self-pay | Admitting: Internal Medicine

## 2014-07-31 ENCOUNTER — Ambulatory Visit (INDEPENDENT_AMBULATORY_CARE_PROVIDER_SITE_OTHER): Payer: 59 | Admitting: Internal Medicine

## 2014-07-31 VITALS — BP 108/70 | HR 75 | Temp 98.4°F | Resp 12 | Wt 244.8 lb

## 2014-07-31 DIAGNOSIS — E039 Hypothyroidism, unspecified: Secondary | ICD-10-CM

## 2014-07-31 LAB — T4, FREE: Free T4: 0.92 ng/dL (ref 0.60–1.60)

## 2014-07-31 LAB — TSH: TSH: 0.9 u[IU]/mL (ref 0.35–4.50)

## 2014-07-31 NOTE — Patient Instructions (Signed)
Please stop at the lab.  Please come back for a follow-up appointment in 1 year.  

## 2014-07-31 NOTE — Progress Notes (Signed)
Patient ID: [name], female   DOB: 03/09/1961, 54 y.o.   MRN: [id number]   HPI  [name] is a 54 y.o.-year-old female, returning for f/u for hypothyroidism. Last visit 6 mo ago.  Since last visit, she saw Dr. 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 She was supposed to start voice rehab in 06-25-22., but her mother died and needed to postpone this.  Reviewed hx: Pt. has been dx with hypothyroidism in 2007 (TSH 59; CK then 2471); started on Synthroid, then switched to Armour by Dr Dwyane Dee; then back on Levothyroxine (but sluggish and tired) so in 09/2012 switched to Armour 90 initially, then increased to 120 mg in 12/2012 >> she finally felt well on this dose.  She takes the thyroid hh: - fasting - with water - separated by >30 min from b'fast  - no PPI, no multivitamins   On Synthroid >> started on 150 mcg >> gradually decreased to 100 mcg daily >> last TSH normal.  I reviewed pt's thyroid tests: Lab Results  Component Value Date   TSH 1.13 05/22/2014   TSH 0.38 01/30/2014   TSH 0.983 10/27/2013   TSH 0.05* 10/21/2013   TSH 0.18* 08/22/2013   TSH 0.062* 07/10/2013   TSH 0.06* 05/26/2013   TSH <0.008* 03/31/2013   TSH 0.11* 01/14/2013   TSH 0.35 12/18/2012   FREET4 0.86 01/30/2014   FREET4 0.90 10/21/2013   FREET4 0.82 08/22/2013   FREET4 1.44 07/10/2013   FREET4 1.27 05/26/2013   FREET4 1.02 03/31/2013   FREET4 0.63 01/14/2013   FREET4 0.74 12/18/2012   FREET4 1.20 10/01/2012   FREET4 1.37 07/30/2012    Pt denies feeling nodules in neck, + hoarseness (lost her voice summer 2014), no dysphagia/odynophagia, SOB with lying down.  Pt describes: - no cold intolerance; has hot flushes - no more weight gain - + fatigue  - + all: N/V/D/C/reflux - no dry skin - no  hair loss  She also has a history of TAH in 2001 - fibroids, BSO in 2005.   ROS: Constitutional: see HPI Eyes: + blurry vision, no xerophthalmia ENT: no sore throat, no nodules palpated in throat, no dysphagia/odynophagia, + hoarseness Cardiovascular: no CP/SOB/palpitations+ /leg swelling Respiratory: + cough/no SOB Gastrointestinal: no N/V/D/+ C/no acid reflux Musculoskeletal: + muscle/no joint aches Skin: no rashes, no hair loss Neurological: no tremors/numbness/tingling/dizziness, + HA + low Libido.  I reviewed pt's medications, allergies, PMH, social hx, family hx, and changes were documented in the history of present illness. Otherwise, unchanged from my initial visit note. She changed the dose of Prozac and Effexor; started fenofibrate and Neurontin.  PE: BP 108/70 mmHg  Pulse 75  Temp(Src) 98.4 F (36.9 C) (Oral)  Resp 12  Wt 244 lb 12.8 oz (111.041 kg)  SpO2 99% Body mass index is 41.39 kg/(m^2). Wt Readings from Last 3 Encounters:  07/31/14 244 lb 12.8 oz (111.041 kg)  07/10/14 242 lb 2 oz (109.827 kg)  05/22/14 243 lb 9.6 oz (110.496 kg)   Constitutional: obese, lost her voice >> talks softly, in NAD Eyes: PERRLA, EOMI, no exophthalmos ENT: moist mucous membranes, no thyromegaly, no cervical lymphadenopathy Cardiovascular: RRR, No MRG Respiratory: CTA B Gastrointestinal: abdomen soft, NT, ND, BS+ Musculoskeletal: no deformities, strength intact in all 4 Skin: moist, warm, no  rashes, but severe acanthosis nigricans - neck, face, hands Neurological: no tremor with outstretched hands, DTR normal in all 4  ASSESSMENT: 1. Hypothyroidism - Thyroid U/S normal 01/2013  PLAN:  1. Patient with long-standing hypothyroidism, on Synthroid therapy, with normalized TSH on LT4 100 mcg daily - She does not appear to have a goiter, thyroid nodules, or neck compression symptoms - We discussed about correct intake of thyroid hh, fasting, with water, separated by at least 30  minutes from breakfast, and separated by more than 4 hours from calcium, iron, multivitamins, acid reflux medications (PPIs). She is taking it correctly. She is off PPIs. - recent thyroid tests (04/2014) show normal LT4 replacement >> will recheck today >> if normal, continue 100 mcg LT4 daily.  - I will see her back in 1 year   CC: Voice Specialist: Dr Wonda Amis ([place])  Office Visit on 07/31/2014  Component Date Value Ref Range Status  . TSH 07/31/2014 0.90  0.35 - 4.50 uIU/mL Final  . Free T4 07/31/2014 0.92  0.60 - 1.60 ng/dL Final   Excellent TFTs.

## 2014-08-12 ENCOUNTER — Encounter (HOSPITAL_COMMUNITY): Payer: Self-pay

## 2014-08-12 ENCOUNTER — Emergency Department (HOSPITAL_COMMUNITY)
Admission: EM | Admit: 2014-08-12 | Discharge: 2014-08-12 | Disposition: A | Discharge status: Home / Self Care | Payer: 59 | Source: Home / Self Care

## 2014-08-12 DIAGNOSIS — J040 Acute laryngitis: Secondary | ICD-10-CM | POA: Diagnosis not present

## 2014-08-12 DIAGNOSIS — J9801 Acute bronchospasm: Secondary | ICD-10-CM

## 2014-08-12 DIAGNOSIS — J0140 Acute pansinusitis, unspecified: Secondary | ICD-10-CM | POA: Diagnosis not present

## 2014-08-12 DIAGNOSIS — E119 Type 2 diabetes mellitus without complications: Secondary | ICD-10-CM | POA: Diagnosis not present

## 2014-08-12 MED ORDER — ALBUTEROL SULFATE (2.5 MG/3ML) 0.083% IN NEBU
2.5000 mg | INHALATION_SOLUTION | Freq: Once | RESPIRATORY_TRACT | Status: AC
  Administered 2014-08-12: 2.5 mg via RESPIRATORY_TRACT

## 2014-08-12 MED ORDER — IPRATROPIUM-ALBUTEROL 0.5-2.5 (3) MG/3ML IN SOLN
RESPIRATORY_TRACT | Status: AC
  Filled 2014-08-12: qty 3

## 2014-08-12 MED ORDER — ALBUTEROL SULFATE HFA 108 (90 BASE) MCG/ACT IN AERS: 2.0000 | INHALATION_SPRAY | RESPIRATORY_TRACT | Status: DC | PRN

## 2014-08-12 MED ORDER — IPRATROPIUM BROMIDE 0.06 % NA SOLN: 2.0000 | Freq: Four times a day (QID) | NASAL | Status: DC

## 2014-08-12 MED ORDER — METHYLPREDNISOLONE SODIUM SUCC 125 MG IJ SOLR
125.0000 mg | Freq: Once | INTRAMUSCULAR | Status: AC
  Administered 2014-08-12: 125 mg via INTRAMUSCULAR

## 2014-08-12 MED ORDER — PREDNISONE 50 MG PO TABS: ORAL_TABLET | ORAL | Status: DC

## 2014-08-12 MED ORDER — IPRATROPIUM-ALBUTEROL 0.5-2.5 (3) MG/3ML IN SOLN
3.0000 mL | Freq: Once | RESPIRATORY_TRACT | Status: AC
  Administered 2014-08-12: 3 mL via RESPIRATORY_TRACT

## 2014-08-12 MED ORDER — ALBUTEROL SULFATE (2.5 MG/3ML) 0.083% IN NEBU
INHALATION_SOLUTION | RESPIRATORY_TRACT | Status: AC
  Filled 2014-08-12: qty 3

## 2014-08-12 MED ORDER — AZITHROMYCIN 250 MG PO TABS: 250.0000 mg | ORAL_TABLET | Freq: Every day | ORAL | Status: DC

## 2014-08-12 MED ORDER — SPACER/AERO-HOLDING CHAMBERS DEVI: Status: DC

## 2014-08-12 MED ORDER — METHYLPREDNISOLONE SODIUM SUCC 125 MG IJ SOLR
INTRAMUSCULAR | Status: AC
  Filled 2014-08-12: qty 2

## 2014-08-12 NOTE — Discharge Instructions (Signed)
You are experiencing bronchospasm which is causing you to cough. This will require steroids and regular albuterol treatments. Please take the steroids for the next 4 days. Please check your sugar during this time as it will make her sugar numbers go up. Please use the albuterol every 4 hours for the next 24 hours. Please consider also using your allergy medicine every day and the nasal Atrovent multiple times per day to help with nasal congestion. If your symptoms of facial pain and congestion do not improve within 24-48 hours, please start the antibiotics.

## 2014-08-12 NOTE — ED Notes (Signed)
C/o flu like symptoms since Sunday. Lost voice due to nodules on vocal cords

## 2014-08-12 NOTE — ED Provider Notes (Signed)
CSN: [id number]     Arrival date & time 08/12/14  1016 History   None    Chief Complaint  Patient presents with  . Cough   (Consider location/radiation/quality/duration/timing/severity/associated sxs/prior Treatment) HPI  Cough: developed 3 days ago. Sinus wrinse w/o benefit. Worsening nasal and sinus congestion. Associated w/ body aches. Theraflu and cough medicine w/ some benefit. Subjective fevers and chills. Started losing voice during illness. No better since onset. Baseline sore throat but worse since onset.   Denies difficulty breathing, nausea, vomiting, diarrhea, constipation, rash, chest pain, syncope, dysuria, frequency, back pain.   Past Medical History  Diagnosis Date  . GERD (gastroesophageal reflux disease)   . Hypertension   . Thyroid disease     hypothyroidism  . Anxiety   . Depression   . Hyperlipidemia   . Chronic rhinosinusitis   . OSA (obstructive sleep apnea)   . Obesity   . Colon polyps   . Fibroid, uterine   . Hoarseness   . Glaucoma 12-13  . IBS (irritable bowel syndrome) 02/13/2011  . Perimenopause 10/05/2012  . Edema 01/06/2013  . Diabetes 01/06/2013  . Chronic pain syndrome 01/06/2013  . Cough 01/06/2013  . Hypokalemia 02/05/2013  . Low back pain 03/03/2013  . Hyperglycemia 04/13/2013  . Hoarseness of voice 05/11/2013  . Obesity, unspecified 05/11/2013  . Raynaud disease 06/16/2013  . Hyperlipemia, mixed 06/06/2007    Qualifier: Diagnosis of  By: Wynona Luna She feels Lipitor caused increased low back pain and weakness    . Muscle cramp 05/22/2014  . Diabetes mellitus type 2 in obese 01/06/2013    Sees Dr Calvert Cantor for eye exam Does not see podiatry, foot exam today unremarkable except for thick cracking skin on heals    Past Surgical History  Procedure Laterality Date  . Cholecystectomy    . Abdominal hysterectomy     Family History  Problem Relation Age of Onset  . Alcohol abuse Father   . Cancer Father     renal and colon  .  Hyperlipidemia Father   . Hypertension Father   . Diabetes    . Cancer Paternal Grandfather     colon   History  Substance Use Topics  . Smoking status: Former Smoker -- 0.50 packs/day for 30 years    Types: Cigarettes    Quit date: 04/25/2003  . Smokeless tobacco: Never Used     Comment: smoked since age 73  . Alcohol Use: No   OB History    No data available     Review of Systems Per HPI with all other pertinent systems negative.   Allergies  Losartan; Aspirin; Codeine; and Amoxicillin  Home Medications   Prior to Admission medications   Medication Sig Start Date End Date Taking? Authorizing Provider  albuterol (PROVENTIL HFA;VENTOLIN HFA) 108 (90 BASE) MCG/ACT inhaler Inhale 2 puffs into the lungs every 4 (four) hours as needed for wheezing or shortness of breath. 08/12/14   Waldemar Dickens, MD  ALPRAZolam Duanne Moron) 0.25 MG tablet 1/2 tab po twice daily x 14 days then 1/2 tab po daily x 7 days then 1/2 to 1 tab daily as needed 07/10/14   Mosie Lukes, MD  azithromycin (ZITHROMAX) 250 MG tablet Take 1 tablet (250 mg total) by mouth daily. Take first 2 tablets together, then 1 every day until finished. 08/12/14   Waldemar Dickens, MD  clotrimazole (LOTRIMIN) 1 % cream Apply topically 2 (two) times daily. 03/29/12   Mora Appl  Hodgin, MD  fenofibrate micronized (ANTARA) 130 MG capsule Take 1 capsule (130 mg total) by mouth daily before breakfast. 05/27/14   Mosie Lukes, MD  FLUoxetine (PROZAC) 20 MG capsule Take 2 capsules (40 mg total) by mouth daily. 07/10/14   Mosie Lukes, MD  furosemide (LASIX) 20 MG tablet TAKE 1-2 TABLETS BY MOUTH DAILY AS NEEDED FOR FLUID OR EDEMA 06/24/14   Mosie Lukes, MD  gabapentin (NEURONTIN) 300 MG capsule Take 3 capsules (900 mg total) by mouth at bedtime. Patient taking differently: Take 900 mg by mouth 2 (two) times daily.  05/22/14   Mosie Lukes, MD  glucose blood (TRUE CARE TEST STRIP PACK) test strip Use as instructed 01/07/13   Mosie Lukes,  MD  hyoscyamine (LEVSIN, ANASPAZ) 0.125 MG tablet Take 1 tablet (0.125 mg total) by mouth every 4 (four) hours as needed for cramping. 02/10/14   Mosie Lukes, MD  ipratropium (ATROVENT) 0.06 % nasal spray Place 2 sprays into both nostrils 4 (four) times daily. 08/12/14   Waldemar Dickens, MD  latanoprost (XALATAN) 0.005 % ophthalmic solution Place 1 drop into both eyes at bedtime.    Historical Provider, MD  metFORMIN (GLUCOPHAGE-XR) 500 MG 24 hr tablet TAKE 2 TABLETS BY MOUTH DAILY WITH BREAKFAST. 05/26/14   Mosie Lukes, MD  nebivolol (BYSTOLIC) 10 MG tablet Take 2 tablets (20 mg total) by mouth daily. 06/16/14   Mosie Lukes, MD  predniSONE (DELTASONE) 50 MG tablet Take daily with breakfast 08/12/14   Waldemar Dickens, MD  Spacer/Aero-Holding Josiah Lobo DEVI Use as directed 08/12/14   Waldemar Dickens, MD  spironolactone (ALDACTONE) 25 MG tablet Take 1 tablet (25 mg total) by mouth 2 (two) times daily. 06/18/14   Mosie Lukes, MD  SYNTHROID 100 MCG tablet Take 1 tablet (100 mcg total) by mouth daily before breakfast. 04/23/14   Philemon Kingdom, MD  TRUEPLUS LANCETS 30G MISC 1 Device by Does not apply route daily as needed. DX 250.00 by Does not apply route daily as needed. DX 250.00 01/07/13   Mosie Lukes, MD  TRUEPLUS LANCETS 30G MISC Use. 01/07/13   Historical Provider, MD  venlafaxine XR (EFFEXOR XR) 75 MG 24 hr capsule Take 1 capsule (75 mg total) by mouth daily with breakfast. 07/10/14   Mosie Lukes, MD  Vitamin D, Ergocalciferol, (DRISDOL) 50000 UNITS CAPS capsule TAKE 1 CAPSULE BY MOUTH EVERY 7 DAYS. 06/24/14   Mosie Lukes, MD   BP 119/90 mmHg  Pulse 70  Temp(Src) 98.2 F (36.8 C) (Oral)  Resp 16  SpO2 98% Physical Exam Physical Exam  Constitutional: oriented to person, place, and time. appears well-developed and well-nourished. No distress.  HENT:  Frontal maxillary sinus tenderness to palpation.  Head: Normocephalic and atraumatic.  Eyes: EOMI. PERRL.  Neck: Normal range of  motion.  Cardiovascular: RRR, no m/r/g, 2+ distal pulses,  Pulmonary/Chest: Diffuse wheezing in all lung fields on auscultation, normal effort,. Which improved after DuoNeb and Solu-Medrol 125 mg. Abdominal: Soft. Bowel sounds are normal. NonTTP, no distension.  Musculoskeletal: Normal range of motion. Non ttp, no effusion.  Neurological: alert and oriented to person, place, and time.  Skin: Skin is warm. No rash noted. non diaphoretic.  Psychiatric: normal mood and affect. behavior is normal. Judgment and thought content normal.    ED Course  Procedures (including critical care time) Labs Review Labs Reviewed - No data to display  Imaging Review No results found.  MDM   1. Acute pansinusitis, recurrence not specified   2. Bronchospasm   3. Laryngitis   4. Type II diabetes mellitus, well controlled    DuoNeb and additional albuterol 2.5 neb administered in clinic along with Solu-Medrol 125 mg IM. Patient improved after DuoNeb. Patient to continue albuterol treatments every 4 hours for the next 24-48 hours as well as prednisone 50 mg daily for the next 4 days. Patient aware that this will make her glucose go up and will monitor her sugars closely during this period of time. Patient's last A1c as of January 2016 was 6.4 which is technically in the prediabetic range. Patient with history of nodular vocal cord disease. But definitely with acute laryngitis as her symptoms of voice change or worse than at baseline. This will likely benefit as well from the steroid treatment. Suspect etiology of condition. Combination of allergic as well as viral but cannot rule out bacterial at this point in time. Patient to try modalities listed above as well as a daily allergy pill, nasal saline and nasal Atrovent. If symptoms do not improve and/or continuation or worsening of fevers and head pain patient will start antibiotics for suspected conversion to bacterial sinusitis.    Waldemar Dickens,  MD 08/12/14 [phone]

## 2014-08-14 ENCOUNTER — Other Ambulatory Visit: Payer: Self-pay | Admitting: Family Medicine

## 2014-08-14 DIAGNOSIS — E119 Type 2 diabetes mellitus without complications: Secondary | ICD-10-CM

## 2014-08-14 NOTE — Telephone Encounter (Signed)
Lab entered

## 2014-08-21 ENCOUNTER — Encounter: Payer: Self-pay | Admitting: Family Medicine

## 2014-08-24 ENCOUNTER — Other Ambulatory Visit: Payer: Self-pay | Admitting: *Deleted

## 2014-08-24 NOTE — Patient Outreach (Signed)
Spoke with [name] via phone to clarify if she wishes to remain in the Link To Wellness program as she has not been seen in follow up since she completed the Type II DM Core Classes on 03/04/13. She states she does wish to continue in the program so she was advised she must be seen this month to continue to qualify for the pharmacy benefit. Available dates and times were securely e-mailed to her [facility] e-mail address with instructions for her to e-mail this RNCM with a date an time that she can meet next week. [name] RN,CCM,CDE [place] Management Coordinator Office Phone [phone] Office Fax [phone]

## 2014-08-28 ENCOUNTER — Other Ambulatory Visit: Payer: Self-pay | Admitting: Family Medicine

## 2014-09-04 ENCOUNTER — Other Ambulatory Visit (INDEPENDENT_AMBULATORY_CARE_PROVIDER_SITE_OTHER): Payer: 59

## 2014-09-04 DIAGNOSIS — E782 Mixed hyperlipidemia: Secondary | ICD-10-CM | POA: Diagnosis not present

## 2014-09-04 DIAGNOSIS — E119 Type 2 diabetes mellitus without complications: Secondary | ICD-10-CM | POA: Diagnosis not present

## 2014-09-04 DIAGNOSIS — F411 Generalized anxiety disorder: Secondary | ICD-10-CM

## 2014-09-04 DIAGNOSIS — E1169 Type 2 diabetes mellitus with other specified complication: Secondary | ICD-10-CM

## 2014-09-04 DIAGNOSIS — I10 Essential (primary) hypertension: Secondary | ICD-10-CM | POA: Diagnosis not present

## 2014-09-04 DIAGNOSIS — E669 Obesity, unspecified: Secondary | ICD-10-CM

## 2014-09-04 LAB — COMPREHENSIVE METABOLIC PANEL
ALT: 19 U/L (ref 0–35)
AST: 19 U/L (ref 0–37)
Albumin: 4 g/dL (ref 3.5–5.2)
Alkaline Phosphatase: 46 U/L (ref 39–117)
BUN: 10 mg/dL (ref 6–23)
CO2: 26 mEq/L (ref 19–32)
Calcium: 9.2 mg/dL (ref 8.4–10.5)
Chloride: 103 mEq/L (ref 96–112)
Creatinine, Ser: 1.01 mg/dL (ref 0.40–1.20)
GFR: 73.52 mL/min (ref >60.00)
Glucose, Bld: 78 mg/dL (ref 70–99)
Potassium: 4.1 mEq/L (ref 3.5–5.1)
Sodium: 135 mEq/L (ref 135–145)
Total Bilirubin: 0.3 mg/dL (ref 0.2–1.2)
Total Protein: 6.8 g/dL (ref 6.0–8.3)

## 2014-09-04 LAB — MICROALBUMIN / CREATININE URINE RATIO
Creatinine,U: 174.4 mg/dL
Microalb Creat Ratio: 0.4 mg/g (ref 0.0–30.0)
Microalb, Ur: <0.7 mg/dL (ref 0.0–1.9)

## 2014-09-04 LAB — CBC
HCT: 37.4 % (ref 36.0–46.0)
Hemoglobin: 12.9 g/dL (ref 12.0–15.0)
MCHC: 34.4 g/dL (ref 30.0–36.0)
MCV: 87.5 fl (ref 78.0–100.0)
Platelets: 328 10*3/uL (ref 150.0–400.0)
RBC: 4.27 Mil/uL (ref 3.87–5.11)
RDW: 14.7 % (ref 11.5–15.5)
WBC: 3.4 10*3/uL — ABNORMAL LOW (ref 4.0–10.5)

## 2014-09-04 LAB — LIPID PANEL
Cholesterol: 217 mg/dL — ABNORMAL HIGH (ref 0–200)
HDL: 45.6 mg/dL (ref >39.00)
LDL Cholesterol: 148 mg/dL — ABNORMAL HIGH (ref 0–99)
NonHDL: 171.4
Total CHOL/HDL Ratio: 5
Triglycerides: 119 mg/dL (ref 0.0–149.0)
VLDL: 23.8 mg/dL (ref 0.0–40.0)

## 2014-09-04 LAB — TSH: TSH: 0.68 u[IU]/mL (ref 0.35–4.50)

## 2014-09-04 LAB — HEMOGLOBIN A1C: Hgb A1c MFr Bld: 6.4 % (ref 4.6–6.5)

## 2014-09-07 ENCOUNTER — Ambulatory Visit: Payer: Self-pay | Admitting: *Deleted

## 2014-09-08 ENCOUNTER — Other Ambulatory Visit: Payer: Self-pay | Admitting: Family Medicine

## 2014-09-11 ENCOUNTER — Encounter: Payer: Self-pay | Admitting: Family Medicine

## 2014-09-11 ENCOUNTER — Ambulatory Visit (INDEPENDENT_AMBULATORY_CARE_PROVIDER_SITE_OTHER): Payer: 59 | Admitting: Family Medicine

## 2014-09-11 VITALS — BP 120/82 | HR 85 | Temp 98.6°F | Ht 64.5 in | Wt 246.4 lb

## 2014-09-11 DIAGNOSIS — K219 Gastro-esophageal reflux disease without esophagitis: Secondary | ICD-10-CM | POA: Diagnosis not present

## 2014-09-11 DIAGNOSIS — R490 Dysphonia: Secondary | ICD-10-CM | POA: Diagnosis not present

## 2014-09-11 DIAGNOSIS — I10 Essential (primary) hypertension: Secondary | ICD-10-CM

## 2014-09-11 MED ORDER — ALBUTEROL SULFATE HFA 108 (90 BASE) MCG/ACT IN AERS: 2.0000 | INHALATION_SPRAY | RESPIRATORY_TRACT | Status: DC | PRN

## 2014-09-11 MED ORDER — FLUOXETINE HCL (PMDD) 20 MG PO TABS: 30.0000 mg | ORAL_TABLET | Freq: Every day | ORAL | Status: DC

## 2014-09-11 MED ORDER — VENLAFAXINE HCL ER 150 MG PO CP24: 150.0000 mg | ORAL_CAPSULE | Freq: Every day | ORAL | Status: DC

## 2014-09-11 NOTE — Progress Notes (Signed)
Pre visit review using our clinic review tool, if applicable. No additional management support is needed unless otherwise documented below in the visit note. 

## 2014-09-11 NOTE — Assessment & Plan Note (Signed)
Avoid offending foods, start probiotics. Do not eat large meals in late evening and consider raising head of bed.  

## 2014-09-11 NOTE — Assessment & Plan Note (Signed)
Well controlled, no changes to meds. Encouraged heart healthy diet such as the DASH diet and exercise as tolerated.  °

## 2014-09-11 NOTE — Assessment & Plan Note (Signed)
Follows at [facility], has next appt on 6/13

## 2014-09-11 NOTE — Assessment & Plan Note (Signed)
Improved after recent treatment with inhalers, steroids and antibiotics

## 2014-09-11 NOTE — Patient Instructions (Signed)

## 2014-09-11 NOTE — Assessment & Plan Note (Signed)
hgba1c acceptable, minimize simple carbs. Increase exercise as tolerated. Continue current meds 

## 2014-09-11 NOTE — Progress Notes (Signed)
[name]  [id number] [birth year] 09/11/2014      Progress Note-Follow Up  Subjective  Chief Complaint  Chief Complaint  Patient presents with  . Follow-up    labs    HPI  Patient is a 54 y.o. female in today for routine medical care. She continues to struggle with hoarseness but no throat pain. Has an appointment for follow-up with ENT at [facility] in June. She is in her usual state of health but does note her cough is improved since we changed medications. She continues to struggle with back pain. Especially with poor taste and low appetite. Denies CP/palp/SOB/HA/congestion/fevers/GI or GU c/o. Taking meds as prescribed are so walls. Her house who was the first one [place]  Past Medical History  Diagnosis Date  . GERD (gastroesophageal reflux disease)   . Hypertension   . Thyroid disease     hypothyroidism  . Anxiety   . Depression   . Hyperlipidemia   . Chronic rhinosinusitis   . OSA (obstructive sleep apnea)   . Obesity   . Colon polyps   . Fibroid, uterine   . Hoarseness   . Glaucoma 12-13  . IBS (irritable bowel syndrome) 02/13/2011  . Perimenopause 10/05/2012  . Edema 01/06/2013  . Diabetes 01/06/2013  . Chronic pain syndrome 01/06/2013  . Cough 01/06/2013  . Hypokalemia 02/05/2013  . Low back pain 03/03/2013  . Hyperglycemia 04/13/2013  . Hoarseness of voice 05/11/2013  . Obesity, unspecified 05/11/2013  . Raynaud disease 06/16/2013  . Hyperlipemia, mixed 06/06/2007    Qualifier: Diagnosis of  By: Wynona Luna She feels Lipitor caused increased low back pain and weakness    . Muscle cramp 05/22/2014  . Diabetes mellitus type 2 in obese 01/06/2013    Sees Dr Calvert Cantor for eye exam Does not see podiatry, foot exam today unremarkable except for thick cracking skin on heals     Past Surgical History  Procedure Laterality Date  . Cholecystectomy    . Abdominal hysterectomy      Family History  Problem Relation Age of Onset  . Alcohol abuse Father   .  Cancer Father     renal and colon  . Hyperlipidemia Father   . Hypertension Father   . Diabetes    . Cancer Paternal Grandfather     colon    History   Social History  . Marital Status: Single    Spouse Name: N/A  . Number of Children: N/A  . Years of Education: N/A   Occupational History  . Not on file.   Social History Main Topics  . Smoking status: Former Smoker -- 0.50 packs/day for 30 years    Types: Cigarettes    Quit date: 04/25/2003  . Smokeless tobacco: Never Used     Comment: smoked since age 60  . Alcohol Use: No  . Drug Use: Not on file  . Sexual Activity: Not on file   Other Topics Concern  . Not on file   Social History Narrative   Endo-- Dr Dwyane Dee   ENT--Dr shoemaker   GI--Dr Buccini   Pulm--Dr Clance   Rheum--Dr Charlestine Night          Current Outpatient Prescriptions on File Prior to Visit  Medication Sig Dispense Refill  . ALPRAZolam (XANAX) 0.25 MG tablet 1/2 tab po twice daily x 14 days then 1/2 tab po daily x 7 days then 1/2 to 1 tab daily as needed 40 tablet 1  . clotrimazole (  LOTRIMIN) 1 % cream Apply topically 2 (two) times daily. 60 g 1  . fenofibrate micronized (ANTARA) 130 MG capsule Take 1 capsule (130 mg total) by mouth daily before breakfast. 30 capsule 3  . FLUoxetine (PROZAC) 20 MG capsule TAKE 2 CAPSULES BY MOUTH DAILY. 30 capsule 3  . furosemide (LASIX) 20 MG tablet TAKE 1-2 TABLETS BY MOUTH DAILY AS NEEDED FOR FLUID OR EDEMA 40 tablet 6  . gabapentin (NEURONTIN) 300 MG capsule Take 3 capsules (900 mg total) by mouth at bedtime. (Patient taking differently: Take 900 mg by mouth 2 (two) times daily. ) 90 capsule 3  . glucose blood (TRUE CARE TEST STRIP PACK) test strip Use as instructed 100 each 3  . hyoscyamine (LEVSIN, ANASPAZ) 0.125 MG tablet Take 1 tablet (0.125 mg total) by mouth every 4 (four) hours as needed for cramping. 40 tablet 1  . ipratropium (ATROVENT) 0.06 % nasal spray Place 2 sprays into both nostrils 4 (four) times daily.  15 mL 12  . latanoprost (XALATAN) 0.005 % ophthalmic solution Place 1 drop into both eyes at bedtime.    . metFORMIN (GLUCOPHAGE-XR) 500 MG 24 hr tablet TAKE 2 TABLETS BY MOUTH DAILY WITH BREAKFAST. 60 tablet 11  . nebivolol (BYSTOLIC) 10 MG tablet Take 2 tablets (20 mg total) by mouth daily. 60 tablet 2  . Spacer/Aero-Holding Dorise Bullion Use as directed 1 each 1  . spironolactone (ALDACTONE) 25 MG tablet Take 1 tablet (25 mg total) by mouth 2 (two) times daily. 60 tablet 1  . SYNTHROID 100 MCG tablet Take 1 tablet (100 mcg total) by mouth daily before breakfast. 60 tablet 2  . TRUEPLUS LANCETS 30G MISC 1 Device by Does not apply route daily as needed. DX 250.00 by Does not apply route daily as needed. DX 250.00 100 each 2  . TRUEPLUS LANCETS 30G MISC Use.    . venlafaxine XR (EFFEXOR XR) 75 MG 24 hr capsule Take 1 capsule (75 mg total) by mouth daily with breakfast. 30 capsule 3  . Vitamin D, Ergocalciferol, (DRISDOL) 50000 UNITS CAPS capsule TAKE 1 CAPSULE BY MOUTH EVERY 7 DAYS. 4 capsule 4   No current facility-administered medications on file prior to visit.    Allergies  Allergen Reactions  . Losartan Cough  . Aspirin     REACTION: Upset stomach  . Codeine   . Amoxicillin Rash    Review of Systems  Review of Systems  Constitutional: Positive for malaise/fatigue. Negative for fever.  HENT: Positive for congestion and sore throat.   Eyes: Negative for discharge.  Respiratory: Positive for cough. Negative for shortness of breath.   Cardiovascular: Negative for chest pain, palpitations and leg swelling.  Gastrointestinal: Negative for nausea, abdominal pain and diarrhea.  Genitourinary: Negative for dysuria.  Musculoskeletal: Positive for myalgias, back pain and joint pain. Negative for falls.  Skin: Negative for rash.  Neurological: Negative for loss of consciousness and headaches.  Endo/Heme/Allergies: Negative for polydipsia.  Psychiatric/Behavioral: Negative for  depression and suicidal ideas. The patient is not nervous/anxious and does not have insomnia.     Objective  BP 120/82 mmHg  Pulse 85  Temp(Src) 98.6 F (37 C) (Oral)  Ht 5' 4.5" (1.638 m)  Wt 246 lb 6 oz (111.755 kg)  BMI 41.65 kg/m2  SpO2 97%  Physical Exam  Physical Exam  Constitutional: She is oriented to person, place, and time and well-developed, well-nourished, and in no distress. No distress.  HENT:  Head: Normocephalic and atraumatic.  Eyes:  Conjunctivae are normal.  Neck: Neck supple. No thyromegaly present.  Cardiovascular: Normal rate, regular rhythm and normal heart sounds.   No murmur heard. Pulmonary/Chest: Effort normal and breath sounds normal. She has no wheezes.  Abdominal: She exhibits no distension and no mass.  Musculoskeletal: She exhibits no edema.  Lymphadenopathy:    She has no cervical adenopathy.  Neurological: She is alert and oriented to person, place, and time.  Skin: Skin is warm and dry. No rash noted. She is not diaphoretic.  Psychiatric: Memory, affect and judgment normal.    Lab Results  Component Value Date   TSH 0.68 09/04/2014   Lab Results  Component Value Date   WBC 3.4* 09/04/2014   HGB 12.9 09/04/2014   HCT 37.4 09/04/2014   MCV 87.5 09/04/2014   PLT 328.0 09/04/2014   Lab Results  Component Value Date   CREATININE 1.01 09/04/2014   BUN 10 09/04/2014   NA 135 09/04/2014   K 4.1 09/04/2014   CL 103 09/04/2014   CO2 26 09/04/2014   Lab Results  Component Value Date   ALT 19 09/04/2014   AST 19 09/04/2014   ALKPHOS 46 09/04/2014   BILITOT 0.3 09/04/2014   Lab Results  Component Value Date   CHOL 217* 09/04/2014   Lab Results  Component Value Date   HDL 45.60 09/04/2014   Lab Results  Component Value Date   LDLCALC 148* 09/04/2014   Lab Results  Component Value Date   TRIG 119.0 09/04/2014   Lab Results  Component Value Date   CHOLHDL 5 09/04/2014     Assessment & Plan  Essential  hypertension Well controlled, no changes to meds. Encouraged heart healthy diet such as the DASH diet and exercise as tolerated.    GERD Avoid offending foods, start probiotics. Do not eat large meals in late evening and consider raising head of bed.    Hoarseness of voice Follows at [facility], has next appt on 6/13   Obstructive sleep apnea Not using CPAP, encouraged to use daily. Not using it will cause heart, brain and lung damage.   Diabetes mellitus type 2 in obese [id number] acceptable, minimize simple carbs. Increase exercise as tolerated. Continue current meds   Cough Improved after recent treatment with inhalers, steroids and antibiotics

## 2014-09-11 NOTE — Assessment & Plan Note (Signed)
Not using CPAP, encouraged to use daily. Not using it will cause heart, brain and lung damage.

## 2014-09-23 ENCOUNTER — Other Ambulatory Visit: Payer: Self-pay | Admitting: Family Medicine

## 2014-09-23 NOTE — Telephone Encounter (Signed)
LOV for BP 5/20 no changes made to BP or HLP medications.

## 2014-09-28 ENCOUNTER — Other Ambulatory Visit: Payer: Self-pay | Admitting: *Deleted

## 2014-09-28 ENCOUNTER — Ambulatory Visit: Payer: Self-pay | Admitting: *Deleted

## 2014-09-28 NOTE — Patient Outreach (Signed)
[name] did not keep her 2:00 pm Link To Wellness appointment and did not call to reschedule or cancel so left message on her mobile number to call care management assistant [name] at [phone] to reschedule as soon as possible in order to remain eligible for the Link To Wellness pharmacy benefits. [name] RN,CCM,CDE [name] Link To Wellness Office Phone [phone] Office Fax [phone]

## 2014-10-02 ENCOUNTER — Ambulatory Visit (INDEPENDENT_AMBULATORY_CARE_PROVIDER_SITE_OTHER): Payer: 59 | Admitting: Family Medicine

## 2014-10-02 VITALS — BP 128/66 | HR 78

## 2014-10-02 DIAGNOSIS — I10 Essential (primary) hypertension: Secondary | ICD-10-CM | POA: Diagnosis not present

## 2014-10-02 NOTE — Progress Notes (Signed)
Pre visit review using our clinic review tool, if applicable. No additional management support is needed unless otherwise documented below in the visit note.  Patient presents for BP check per office visit 09/11/14 discharge instructions:  Follow-up and Disposition     Return in about 3 weeks (around 10/02/2014), or if symptoms worsen or fail to improve, for BP check. 3 month f/u visit with MD with labs prior.     Patient in for BP check, BP WNL no adjustments needed

## 2014-10-05 ENCOUNTER — Other Ambulatory Visit: Payer: Self-pay | Admitting: Internal Medicine

## 2014-10-05 ENCOUNTER — Other Ambulatory Visit: Payer: Self-pay | Admitting: Family Medicine

## 2014-10-05 NOTE — Telephone Encounter (Signed)
Requesting:  ALPRAZOLAM Contract---- NONE UDS-----NONE Last OV--10/02/14 Last Refill----07/10/14  #40 WITH 1 REFILL  Please Advise

## 2014-10-05 NOTE — Telephone Encounter (Signed)
Faxed hardcopy for Alprazolam to to [facility].

## 2014-10-19 ENCOUNTER — Other Ambulatory Visit: Payer: Self-pay

## 2014-11-18 ENCOUNTER — Other Ambulatory Visit: Payer: Self-pay | Admitting: Family Medicine

## 2014-11-18 NOTE — Telephone Encounter (Signed)
12/07/2014 9:00 AM [facility] Archivist at [facility]     12/21/2014 9:00 AM Mosie Lukes, MD [facility] has upcoming lab appointments. Is currently out of her Vit D. Should this be refilled until her appointment or instruct patient to take OTC 2000 IU until labs? Thank you

## 2014-12-07 ENCOUNTER — Other Ambulatory Visit (INDEPENDENT_AMBULATORY_CARE_PROVIDER_SITE_OTHER): Payer: 59

## 2014-12-07 DIAGNOSIS — I10 Essential (primary) hypertension: Secondary | ICD-10-CM | POA: Diagnosis not present

## 2014-12-07 LAB — COMPREHENSIVE METABOLIC PANEL
ALT: 26 U/L (ref 0–35)
AST: 25 U/L (ref 0–37)
Albumin: 4.4 g/dL (ref 3.5–5.2)
Alkaline Phosphatase: 51 U/L (ref 39–117)
BUN: 11 mg/dL (ref 6–23)
CO2: 28 mEq/L (ref 19–32)
Calcium: 9.6 mg/dL (ref 8.4–10.5)
Chloride: 104 mEq/L (ref 96–112)
Creatinine, Ser: 1.16 mg/dL (ref 0.40–1.20)
GFR: 62.6 mL/min (ref >60.00)
Glucose, Bld: 91 mg/dL (ref 70–99)
Potassium: 4.4 mEq/L (ref 3.5–5.1)
Sodium: 141 mEq/L (ref 135–145)
Total Bilirubin: 0.3 mg/dL (ref 0.2–1.2)
Total Protein: 7.3 g/dL (ref 6.0–8.3)

## 2014-12-07 LAB — LIPID PANEL
Cholesterol: 216 mg/dL — ABNORMAL HIGH (ref 0–200)
HDL: 44.5 mg/dL (ref >39.00)
LDL Cholesterol: 147 mg/dL — ABNORMAL HIGH (ref 0–99)
NonHDL: 171.46
Total CHOL/HDL Ratio: 5
Triglycerides: 122 mg/dL (ref 0.0–149.0)
VLDL: 24.4 mg/dL (ref 0.0–40.0)

## 2014-12-07 LAB — CBC
HCT: 42.4 % (ref 36.0–46.0)
Hemoglobin: 14 g/dL (ref 12.0–15.0)
MCHC: 33 g/dL (ref 30.0–36.0)
MCV: 89.8 fl (ref 78.0–100.0)
Platelets: 362 10*3/uL (ref 150.0–400.0)
RBC: 4.72 Mil/uL (ref 3.87–5.11)
RDW: 14 % (ref 11.5–15.5)
WBC: 4.1 10*3/uL (ref 4.0–10.5)

## 2014-12-07 LAB — TSH: TSH: 0.62 u[IU]/mL (ref 0.35–4.50)

## 2014-12-07 LAB — HEMOGLOBIN A1C: Hgb A1c MFr Bld: 6.2 % (ref 4.6–6.5)

## 2014-12-10 LAB — VITAMIN D 1,25 DIHYDROXY
Vitamin D 1, 25 (OH)2 Total: 86 pg/mL — ABNORMAL HIGH (ref 18–72)
Vitamin D2 1, 25 (OH)2: 68 pg/mL
Vitamin D3 1, 25 (OH)2: 18 pg/mL

## 2014-12-15 ENCOUNTER — Ambulatory Visit: Payer: Self-pay | Admitting: Family Medicine

## 2014-12-21 ENCOUNTER — Ambulatory Visit (INDEPENDENT_AMBULATORY_CARE_PROVIDER_SITE_OTHER): Payer: 59 | Admitting: Family Medicine

## 2014-12-21 ENCOUNTER — Encounter: Payer: Self-pay | Admitting: Family Medicine

## 2014-12-21 VITALS — BP 125/80 | HR 78 | Temp 98.1°F | Ht 64.5 in | Wt 244.4 lb

## 2014-12-21 DIAGNOSIS — Z0000 Encounter for general adult medical examination without abnormal findings: Secondary | ICD-10-CM

## 2014-12-21 DIAGNOSIS — E1169 Type 2 diabetes mellitus with other specified complication: Secondary | ICD-10-CM

## 2014-12-21 DIAGNOSIS — E039 Hypothyroidism, unspecified: Secondary | ICD-10-CM | POA: Diagnosis not present

## 2014-12-21 DIAGNOSIS — K219 Gastro-esophageal reflux disease without esophagitis: Secondary | ICD-10-CM | POA: Diagnosis not present

## 2014-12-21 DIAGNOSIS — I10 Essential (primary) hypertension: Secondary | ICD-10-CM | POA: Diagnosis not present

## 2014-12-21 DIAGNOSIS — E119 Type 2 diabetes mellitus without complications: Secondary | ICD-10-CM

## 2014-12-21 DIAGNOSIS — E785 Hyperlipidemia, unspecified: Secondary | ICD-10-CM

## 2014-12-21 DIAGNOSIS — E559 Vitamin D deficiency, unspecified: Secondary | ICD-10-CM

## 2014-12-21 DIAGNOSIS — E669 Obesity, unspecified: Secondary | ICD-10-CM

## 2014-12-21 DIAGNOSIS — R490 Dysphonia: Secondary | ICD-10-CM

## 2014-12-21 MED ORDER — FLUOXETINE HCL 10 MG PO CAPS: 30.0000 mg | ORAL_CAPSULE | Freq: Every day | ORAL | Status: DC

## 2014-12-21 MED ORDER — ROSUVASTATIN CALCIUM 5 MG PO TABS: ORAL_TABLET | ORAL | Status: DC

## 2014-12-21 NOTE — Assessment & Plan Note (Signed)
hgba1c acceptable, minimize simple carbs. Increase exercise as tolerated. Continue current meds 

## 2014-12-21 NOTE — Assessment & Plan Note (Signed)
Elevated will stop 50000 IU and start 2000 IU daily

## 2014-12-21 NOTE — Progress Notes (Signed)
Pre visit review using our clinic review tool, if applicable. No additional management support is needed unless otherwise documented below in the visit note. 

## 2014-12-21 NOTE — Assessment & Plan Note (Signed)
Did better on the Fluoxetine 30 mg but only got 20 mg with last refill will change rx for 10 mg tabs 3 tabs daily, continue Venlafaxine

## 2014-12-21 NOTE — Assessment & Plan Note (Signed)
Avoid offending foods, start probiotics. Do not eat large meals in late evening and consider raising head of bed.  

## 2014-12-21 NOTE — Assessment & Plan Note (Signed)
On Levothyroxine, continue to monitor 

## 2014-12-21 NOTE — Progress Notes (Signed)
Patient ID: [name], female   DOB: 12-17-1960, 54 y.o.   MRN: [id number]   Subjective:    Patient ID: [name], female    DOB: 23-Jan-1961, 54 y.o.   MRN: [id number]  Chief Complaint  Patient presents with  . Follow-up    HPI Patient is in today for follow-up on numerous conditions. Her hoarseness persists but her sore throat is improved. She follows closely w at [facility].Her decreased taste persists. She has been trying to eat better. She has started back water aerobics. No new or acute illness. Denies CP/palp/SOB/HA/congestion/fevers/GI or GU c/o. Taking meds as prescribed Past Medical History  Diagnosis Date  . GERD (gastroesophageal reflux disease)   . Hypertension   . Thyroid disease     hypothyroidism  . Anxiety   . Depression   . Hyperlipidemia   . Chronic rhinosinusitis   . OSA (obstructive sleep apnea)   . Obesity   . Colon polyps   . Fibroid, uterine   . Hoarseness   . Glaucoma 12-13  . IBS (irritable bowel syndrome) 02/13/2011  . Perimenopause 10/05/2012  . Edema 01/06/2013  . Diabetes 01/06/2013  . Chronic pain syndrome 01/06/2013  . Cough 01/06/2013  . Hypokalemia 02/05/2013  . Low back pain 03/03/2013  . Hyperglycemia 04/13/2013  . Hoarseness of voice 05/11/2013  . Obesity, unspecified 05/11/2013  . Raynaud disease 06/16/2013  . Hyperlipemia, mixed 06/06/2007    Qualifier: Diagnosis of  By: Wynona Luna She feels Lipitor caused increased low back pain and weakness    . Muscle cramp 05/22/2014  . Diabetes mellitus type 2 in obese 01/06/2013    Sees Dr Calvert Cantor for eye exam Does not see podiatry, foot exam today unremarkable except for thick cracking skin on heals     Past Surgical History  Procedure Laterality Date  . Cholecystectomy    . Abdominal hysterectomy      Family History  Problem Relation Age of Onset  . Alcohol abuse Father   . Cancer Father     renal and colon  . Hyperlipidemia Father   . Hypertension Father   . Diabetes    .  Cancer Paternal Grandfather     colon    Social History   Social History  . Marital Status: Single    Spouse Name: N/A  . Number of Children: N/A  . Years of Education: N/A   Occupational History  . Not on file.   Social History Main Topics  . Smoking status: Former Smoker -- 0.50 packs/day for 30 years    Types: Cigarettes    Quit date: 04/25/2003  . Smokeless tobacco: Never Used     Comment: smoked since age 36  . Alcohol Use: No  . Drug Use: Not on file  . Sexual Activity: Not on file   Other Topics Concern  . Not on file   Social History Narrative   Endo-- Dr Dwyane Dee   ENT--Dr shoemaker   GI--Dr Buccini   Pulm--Dr Clance   Rheum--Dr Charlestine Night          Outpatient Prescriptions Prior to Visit  Medication Sig Dispense Refill  . albuterol (PROVENTIL HFA;VENTOLIN HFA) 108 (90 BASE) MCG/ACT inhaler Inhale 2 puffs into the lungs every 4 (four) hours as needed for wheezing or shortness of breath. 1 Inhaler 6  . BYSTOLIC 20 MG TABS TAKE 1 TABLET BY MOUTH ONCE DAILY 30 tablet 2  . fenofibrate micronized (ANTARA) 130 MG capsule TAKE 1 CAPSULE  BY MOUTH DAILY BEFORE BREAKFAST 30 capsule 3  . Fluoxetine HCl, PMDD, 20 MG TABS Take 1.5 tablets (30 mg total) by mouth daily. 45 tablet 3  . furosemide (LASIX) 20 MG tablet TAKE 1-2 TABLETS BY MOUTH DAILY AS NEEDED FOR FLUID OR EDEMA 40 tablet 6  . gabapentin (NEURONTIN) 300 MG capsule Take 3 capsules (900 mg total) by mouth at bedtime. (Patient taking differently: Take 900 mg by mouth 2 (two) times daily. ) 90 capsule 3  . glucose blood (TRUE CARE TEST STRIP PACK) test strip Use as instructed 100 each 3  . hyoscyamine (LEVSIN, ANASPAZ) 0.125 MG tablet Take 1 tablet (0.125 mg total) by mouth every 4 (four) hours as needed for cramping. 40 tablet 1  . ipratropium (ATROVENT) 0.06 % nasal spray Place 2 sprays into both nostrils 4 (four) times daily. 15 mL 12  . latanoprost (XALATAN) 0.005 % ophthalmic solution Place 1 drop into both eyes at  bedtime.    . metFORMIN (GLUCOPHAGE-XR) 500 MG 24 hr tablet TAKE 2 TABLETS BY MOUTH DAILY WITH BREAKFAST. 60 tablet 11  . Spacer/Aero-Holding Dorise Bullion Use as directed 1 each 1  . spironolactone (ALDACTONE) 25 MG tablet Take 1 tablet (25 mg total) by mouth 2 (two) times daily. 60 tablet 1  . SYNTHROID 100 MCG tablet TAKE 1 TABLET (100 MCG TOTAL) BY MOUTH DAILY BEFORE BREAKFAST. 60 tablet 2  . TRUEPLUS LANCETS 30G MISC 1 Device by Does not apply route daily as needed. DX 250.00 by Does not apply route daily as needed. DX 250.00 100 each 2  . TRUEPLUS LANCETS 30G MISC Use.    . venlafaxine XR (EFFEXOR XR) 150 MG 24 hr capsule Take 1 capsule (150 mg total) by mouth daily with breakfast. 30 capsule 3  . Vitamin D, Ergocalciferol, (DRISDOL) 50000 UNITS CAPS capsule TAKE 1 CAPSULE BY MOUTH EVERY 7 DAYS 4 capsule PRN  . ALPRAZolam (XANAX) 0.25 MG tablet TAKE 1/2 TABLET BY MOUTH 2 TIMES A DAY FOR 14 DAYS, THEN 1/2 TAB DAILY FOR 7 DAYS, THE 1/2 - 1 TAB DAILY AS NEEDED (Patient not taking: Reported on 12/21/2014) 40 tablet 1  . clotrimazole (LOTRIMIN) 1 % cream Apply topically 2 (two) times daily. (Patient not taking: Reported on 12/21/2014) 60 g 1   No facility-administered medications prior to visit.    Allergies  Allergen Reactions  . Losartan Cough  . Aspirin     REACTION: Upset stomach  . Atorvastatin     myalgias  . Codeine   . Amoxicillin Rash    Review of Systems  Constitutional: Positive for malaise/fatigue. Negative for fever.  HENT: Positive for congestion. Negative for sore throat.   Eyes: Negative for discharge.  Respiratory: Negative for shortness of breath.   Cardiovascular: Negative for chest pain, palpitations and leg swelling.  Gastrointestinal: Negative for nausea and abdominal pain.  Genitourinary: Negative for dysuria.  Musculoskeletal: Negative for falls.  Skin: Negative for rash.  Neurological: Negative for loss of consciousness and headaches.  Endo/Heme/Allergies:  Negative for environmental allergies.  Psychiatric/Behavioral: Negative for depression. The patient is not nervous/anxious.        Objective:    Physical Exam  Constitutional: She is oriented to person, place, and time. She appears well-developed and well-nourished. No distress.  HENT:  Head: Normocephalic and atraumatic.  Nose: Nose normal.  Eyes: Right eye exhibits no discharge. Left eye exhibits no discharge.  Neck: Normal range of motion. Neck supple.  Cardiovascular: Normal rate and regular rhythm.  No murmur heard. Pulmonary/Chest: Effort normal and breath sounds normal.  Abdominal: Soft. Bowel sounds are normal. There is no tenderness.  Musculoskeletal: She exhibits no edema.  Neurological: She is alert and oriented to person, place, and time.  Skin: Skin is warm and dry.  Psychiatric: She has a normal mood and affect.  Nursing note and vitals reviewed.   BP 125/80 mmHg  Pulse 78  Temp(Src) 98.1 F (36.7 C) (Oral)  Ht 5' 4.5" (1.638 m)  Wt 244 lb 6 oz (110.848 kg)  BMI 41.31 kg/m2  SpO2 98% Wt Readings from Last 3 Encounters:  12/21/14 244 lb 6 oz (110.848 kg)  09/11/14 246 lb 6 oz (111.755 kg)  07/31/14 244 lb 12.8 oz (111.041 kg)     Lab Results  Component Value Date   WBC 4.1 12/07/2014   HGB 14.0 12/07/2014   HCT 42.4 12/07/2014   PLT 362.0 12/07/2014   GLUCOSE 91 12/07/2014   CHOL 216* 12/07/2014   TRIG 122.0 12/07/2014   HDL 44.50 12/07/2014   LDLDIRECT 160.8 01/30/2014   LDLCALC 147* 12/07/2014   ALT 26 12/07/2014   AST 25 12/07/2014   NA 141 12/07/2014   K 4.4 12/07/2014   CL 104 12/07/2014   CREATININE 1.16 12/07/2014   BUN 11 12/07/2014   CO2 28 12/07/2014   TSH 0.62 12/07/2014   HGBA1C 6.2 12/07/2014   MICROALBUR <0.7 09/04/2014    Lab Results  Component Value Date   TSH 0.62 12/07/2014   Lab Results  Component Value Date   WBC 4.1 12/07/2014   HGB 14.0 12/07/2014   HCT 42.4 12/07/2014   MCV 89.8 12/07/2014   PLT 362.0  12/07/2014   Lab Results  Component Value Date   NA 141 12/07/2014   K 4.4 12/07/2014   CO2 28 12/07/2014   GLUCOSE 91 12/07/2014   BUN 11 12/07/2014   CREATININE 1.16 12/07/2014   BILITOT 0.3 12/07/2014   ALKPHOS 51 12/07/2014   AST 25 12/07/2014   ALT 26 12/07/2014   PROT 7.3 12/07/2014   ALBUMIN 4.4 12/07/2014   CALCIUM 9.6 12/07/2014   GFR 62.60 12/07/2014   Lab Results  Component Value Date   CHOL 216* 12/07/2014   Lab Results  Component Value Date   HDL 44.50 12/07/2014   Lab Results  Component Value Date   LDLCALC 147* 12/07/2014   Lab Results  Component Value Date   TRIG 122.0 12/07/2014   Lab Results  Component Value Date   CHOLHDL 5 12/07/2014   Lab Results  Component Value Date   HGBA1C 6.2 12/07/2014       Assessment & Plan:  Hypothyroidism On Levothyroxine, continue to monitor  GERD Avoid offending foods, start probiotics. Do not eat large meals in late evening and consider raising head of bed.   Diabetes mellitus type 2 in obese hgba1c acceptable, minimize simple carbs. Increase exercise as tolerated. Continue current meds  Vitamin D deficiency Elevated will stop 50000 IU and start 2000 IU daily  Hyperlipemia, mixed Encouraged heart healthy diet, increase exercise, avoid trans fats, consider a krill oil cap daily. Start Crestor 5 mg po qhs, 2 days. Patient very worried about myalgias from Lipitor so will start just 2 days a week Crestor 5 mg. Stop Antara.  AP (abdominal pain) No recent episodes, doing better no need for hyoscyamine  ADJ DISORDER WITH MIXED ANXIETY & DEPRESSED MOOD Did better on the Fluoxetine 30 mg but only got 20 mg with last refill  will change rx for 10 mg tabs 3 tabs daily, continue Venlafaxine  Hoarseness of voice Follows with [facility] is doing better with hoarseness but still some irritation but pain is resolved  Obesity Encouraged DASH diet, decrease po intake and increase exercise as tolerated. Needs 7-8 hours of  sleep nightly. Avoid trans fats, eat small, frequent meals every 4-5 hours with lean proteins, complex carbs and healthy fats. Minimize simple carbs   I am having Ms. Rekowski maintain her clotrimazole, latanoprost, TRUEPLUS LANCETS 30G, glucose blood, hyoscyamine, gabapentin, metFORMIN, spironolactone, TRUEPLUS LANCETS 30G, Spacer/Aero-Holding Chambers, ipratropium, furosemide, albuterol, venlafaxine XR, Fluoxetine HCl (PMDD), fenofibrate micronized, BYSTOLIC, ALPRAZolam, SYNTHROID, and Vitamin D (Ergocalciferol).  No orders of the defined types were placed in this encounter.     Elizabeth Sauer, LPN

## 2014-12-21 NOTE — Assessment & Plan Note (Addendum)
Encouraged heart healthy diet, increase exercise, avoid trans fats, consider a krill oil cap daily. Start Crestor 5 mg po qhs, 2 days. Patient very worried about myalgias from Lipitor so will start just 2 days a week Crestor 5 mg. Stop Antara.

## 2014-12-21 NOTE — Patient Instructions (Signed)

## 2014-12-21 NOTE — Assessment & Plan Note (Signed)
No recent episodes, doing better no need for hyoscyamine

## 2014-12-21 NOTE — Assessment & Plan Note (Addendum)
Follows with [facility] is doing better with hoarseness but still some irritation but pain is resolved

## 2014-12-31 ENCOUNTER — Other Ambulatory Visit: Payer: Self-pay | Admitting: Family Medicine

## 2015-01-03 NOTE — Assessment & Plan Note (Signed)
Encouraged DASH diet, decrease po intake and increase exercise as tolerated. Needs 7-8 hours of sleep nightly. Avoid trans fats, eat small, frequent meals every 4-5 hours with lean proteins, complex carbs and healthy fats. Minimize simple carbs 

## 2015-01-08 ENCOUNTER — Encounter: Payer: Self-pay | Admitting: Family Medicine

## 2015-01-12 MED ORDER — FENOFIBRATE MICRONIZED 130 MG PO CAPS: ORAL_CAPSULE | ORAL | Status: DC

## 2015-01-12 NOTE — Telephone Encounter (Signed)
Pt states she tried the crestor and gave her muscle cramps and she not ready to try that right now. Refilled fenofibrate.

## 2015-01-25 ENCOUNTER — Other Ambulatory Visit: Payer: Self-pay | Admitting: Family Medicine

## 2015-03-01 ENCOUNTER — Other Ambulatory Visit: Payer: Self-pay | Admitting: Family Medicine

## 2015-03-12 ENCOUNTER — Encounter: Payer: Self-pay | Admitting: Family Medicine

## 2015-03-15 ENCOUNTER — Other Ambulatory Visit (INDEPENDENT_AMBULATORY_CARE_PROVIDER_SITE_OTHER): Payer: 59

## 2015-03-15 DIAGNOSIS — E559 Vitamin D deficiency, unspecified: Secondary | ICD-10-CM | POA: Diagnosis not present

## 2015-03-15 DIAGNOSIS — I10 Essential (primary) hypertension: Secondary | ICD-10-CM | POA: Diagnosis not present

## 2015-03-15 DIAGNOSIS — E119 Type 2 diabetes mellitus without complications: Secondary | ICD-10-CM | POA: Diagnosis not present

## 2015-03-15 DIAGNOSIS — Z0000 Encounter for general adult medical examination without abnormal findings: Secondary | ICD-10-CM

## 2015-03-15 DIAGNOSIS — E039 Hypothyroidism, unspecified: Secondary | ICD-10-CM

## 2015-03-15 DIAGNOSIS — E669 Obesity, unspecified: Secondary | ICD-10-CM | POA: Diagnosis not present

## 2015-03-15 DIAGNOSIS — E785 Hyperlipidemia, unspecified: Secondary | ICD-10-CM | POA: Diagnosis not present

## 2015-03-15 DIAGNOSIS — E1169 Type 2 diabetes mellitus with other specified complication: Secondary | ICD-10-CM

## 2015-03-15 LAB — HEMOGLOBIN A1C: Hgb A1c MFr Bld: 6.3 % (ref 4.6–6.5)

## 2015-03-15 LAB — HEPATITIS C ANTIBODY: HCV Ab: NEGATIVE

## 2015-03-15 LAB — LIPID PANEL
Cholesterol: 226 mg/dL — ABNORMAL HIGH (ref 0–200)
HDL: 56.6 mg/dL (ref >39.00)
LDL Cholesterol: 141 mg/dL — ABNORMAL HIGH (ref 0–99)
NonHDL: 169.46
Total CHOL/HDL Ratio: 4
Triglycerides: 144 mg/dL (ref 0.0–149.0)
VLDL: 28.8 mg/dL (ref 0.0–40.0)

## 2015-03-15 LAB — COMPREHENSIVE METABOLIC PANEL
ALT: 24 U/L (ref 0–35)
AST: 18 U/L (ref 0–37)
Albumin: 4.1 g/dL (ref 3.5–5.2)
Alkaline Phosphatase: 65 U/L (ref 39–117)
BUN: 11 mg/dL (ref 6–23)
CO2: 26 mEq/L (ref 19–32)
Calcium: 9.4 mg/dL (ref 8.4–10.5)
Chloride: 104 mEq/L (ref 96–112)
Creatinine, Ser: 1.04 mg/dL (ref 0.40–1.20)
GFR: 70.94 mL/min (ref >60.00)
Glucose, Bld: 86 mg/dL (ref 70–99)
Potassium: 4 mEq/L (ref 3.5–5.1)
Sodium: 139 mEq/L (ref 135–145)
Total Bilirubin: 0.3 mg/dL (ref 0.2–1.2)
Total Protein: 6.7 g/dL (ref 6.0–8.3)

## 2015-03-15 LAB — VITAMIN D 25 HYDROXY (VIT D DEFICIENCY, FRACTURES): VITD: 34.15 ng/mL (ref 30.00–100.00)

## 2015-03-15 LAB — TSH: TSH: 1.77 u[IU]/mL (ref 0.35–4.50)

## 2015-03-16 ENCOUNTER — Encounter: Payer: Self-pay | Admitting: Internal Medicine

## 2015-03-16 ENCOUNTER — Other Ambulatory Visit: Payer: Self-pay | Admitting: Family Medicine

## 2015-03-16 DIAGNOSIS — IMO0001 Reserved for inherently not codable concepts without codable children: Secondary | ICD-10-CM

## 2015-03-16 LAB — CBC
HCT: 40.8 % (ref 36.0–46.0)
Hemoglobin: 13.6 g/dL (ref 12.0–15.0)
MCHC: 33.3 g/dL (ref 30.0–36.0)
MCV: 89.1 fl (ref 78.0–100.0)
Platelets: 329 10*3/uL (ref 150.0–400.0)
RBC: 4.58 Mil/uL (ref 3.87–5.11)
RDW: 14.5 % (ref 11.5–15.5)
WBC: 6.1 10*3/uL (ref 4.0–10.5)

## 2015-03-16 LAB — HIV ANTIBODY (ROUTINE TESTING W REFLEX): HIV 1&2 Ab, 4th Generation: NONREACTIVE

## 2015-03-22 ENCOUNTER — Ambulatory Visit (INDEPENDENT_AMBULATORY_CARE_PROVIDER_SITE_OTHER): Payer: 59 | Admitting: Family Medicine

## 2015-03-22 ENCOUNTER — Encounter: Payer: Self-pay | Admitting: Family Medicine

## 2015-03-22 VITALS — HR 99 | Temp 98.9°F | Ht 65.0 in | Wt 247.1 lb

## 2015-03-22 DIAGNOSIS — Z23 Encounter for immunization: Secondary | ICD-10-CM

## 2015-03-22 DIAGNOSIS — M791 Myalgia, unspecified site: Secondary | ICD-10-CM

## 2015-03-22 DIAGNOSIS — E119 Type 2 diabetes mellitus without complications: Secondary | ICD-10-CM

## 2015-03-22 DIAGNOSIS — E1169 Type 2 diabetes mellitus with other specified complication: Secondary | ICD-10-CM

## 2015-03-22 DIAGNOSIS — F418 Other specified anxiety disorders: Secondary | ICD-10-CM | POA: Diagnosis not present

## 2015-03-22 DIAGNOSIS — E039 Hypothyroidism, unspecified: Secondary | ICD-10-CM

## 2015-03-22 DIAGNOSIS — I10 Essential (primary) hypertension: Secondary | ICD-10-CM

## 2015-03-22 DIAGNOSIS — K219 Gastro-esophageal reflux disease without esophagitis: Secondary | ICD-10-CM

## 2015-03-22 DIAGNOSIS — E669 Obesity, unspecified: Secondary | ICD-10-CM

## 2015-03-22 DIAGNOSIS — R490 Dysphonia: Secondary | ICD-10-CM

## 2015-03-22 DIAGNOSIS — E079 Disorder of thyroid, unspecified: Secondary | ICD-10-CM

## 2015-03-22 LAB — T3, FREE: T3, Free: 3.9 pg/mL (ref 2.3–4.2)

## 2015-03-22 LAB — T4, FREE: Free T4: 0.8 ng/dL (ref 0.60–1.60)

## 2015-03-22 MED ORDER — VITAMIN D (ERGOCALCIFEROL) 1.25 MG (50000 UNIT) PO CAPS: 50000.0000 [IU] | ORAL_CAPSULE | ORAL | Status: DC

## 2015-03-22 MED ORDER — GABAPENTIN 100 MG PO CAPS: 100.0000 mg | ORAL_CAPSULE | Freq: Three times a day (TID) | ORAL | Status: DC

## 2015-03-22 NOTE — Progress Notes (Signed)
Pre visit review using our clinic review tool, if applicable. No additional management support is needed unless otherwise documented below in the visit note. 

## 2015-03-22 NOTE — Progress Notes (Signed)
[name] [id number] [birth year] 03/22/2015      Patient Progress Note   Subjective  Chief Complaint  Chief Complaint  Patient presents with  . Follow-up    HPI  Patient is in today for follow-up on numerous conditions. She endorses fatigue, muscle aches, and lethargy. This began early this month. She began having severe muscle pains after 3 doses of the crestor. She has not taken it since. Her hoarseness persists, has developed a cough the last two nights, endorses significant throat pain for 2-3 weeks. She sees Dr. Patrice Paradise and has multiple vocal cord nodules and went through a course of steroids for one week prior to Thanksgiving. Her appetite is poor and her sleep is moderate except for recently due to coughing.  Has not been able to do water aerobics because of low energy levels and back pain. Seeing a spine specialist at [facility] next week. Has previously been told she has fibromyalgia. She is taking aleve for pain, stomach has been doing well. She sees Dr. Derrill Kay for her thyroid and saw her in May. Feels that someone is always touching her or over her shoulder, but denies significant anxiety or depression. Patient denies shortness of breath, chest pain,changes in urination, GI issues, recent fevers or illnesses    Past Medical History  Diagnosis Date  . GERD (gastroesophageal reflux disease)   . Hypertension   . Thyroid disease     hypothyroidism  . Anxiety   . Depression   . Hyperlipidemia   . Chronic rhinosinusitis   . OSA (obstructive sleep apnea)   . Obesity   . Colon polyps   . Fibroid, uterine   . Hoarseness   . Glaucoma 12-13  . IBS (irritable bowel syndrome) 02/13/2011  . Perimenopause 10/05/2012  . Edema 01/06/2013  . Diabetes ([place]) 01/06/2013  . Chronic pain syndrome 01/06/2013  . Cough 01/06/2013  . Hypokalemia 02/05/2013  . Low back pain 03/03/2013  . Hyperglycemia 04/13/2013  . Hoarseness of voice 05/11/2013  . Obesity, unspecified 05/11/2013  . Raynaud  disease 06/16/2013  . Hyperlipemia, mixed 06/06/2007    Qualifier: Diagnosis of  By: Wynona Luna She feels Lipitor caused increased low back pain and weakness    . Muscle cramp 05/22/2014  . Diabetes mellitus type 2 in obese [facility]) 01/06/2013    Sees Dr Calvert Cantor for eye exam Does not see podiatry, foot exam today unremarkable except for thick cracking skin on heals     Past Surgical History  Procedure Laterality Date  . Cholecystectomy    . Abdominal hysterectomy      Family History  Problem Relation Age of Onset  . Alcohol abuse Father   . Cancer Father     renal and colon  . Hyperlipidemia Father   . Hypertension Father   . Diabetes    . Cancer Paternal Grandfather     colon    Social History   Social History  . Marital Status: Single    Spouse Name: N/A  . Number of Children: N/A  . Years of Education: N/A   Occupational History  . Not on file.   Social History Main Topics  . Smoking status: Former Smoker -- 0.50 packs/day for 30 years    Types: Cigarettes    Quit date: 04/25/2003  . Smokeless tobacco: Never Used     Comment: smoked since age 89  . Alcohol Use: No  . Drug Use: Not on file  . Sexual Activity: Not on  file   Other Topics Concern  . Not on file   Social History Narrative   Endo-- Dr Dwyane Dee   ENT--Dr shoemaker   GI--Dr Buccini   Pulm--Dr Clance   Rheum--Dr Charlestine Night          Current Outpatient Prescriptions on File Prior to Visit  Medication Sig Dispense Refill  . albuterol (PROVENTIL HFA;VENTOLIN HFA) 108 (90 BASE) MCG/ACT inhaler Inhale 2 puffs into the lungs every 4 (four) hours as needed for wheezing or shortness of breath. 1 Inhaler 6  . ALPRAZolam (XANAX) 0.25 MG tablet TAKE 1/2 TABLET BY MOUTH 2 TIMES A DAY FOR 14 DAYS, THEN 1/2 TAB DAILY FOR 7 DAYS, THE 1/2 - 1 TAB DAILY AS NEEDED 40 tablet 1  . BYSTOLIC 20 MG TABS TAKE 1 TABLET BY MOUTH ONCE DAILY 30 tablet 6  . clotrimazole (LOTRIMIN) 1 % cream Apply topically 2 (two) times  daily. 60 g 1  . fenofibrate micronized (ANTARA) 130 MG capsule TAKE 1 CAPSULE BY MOUTH DAILY BEFORE BREAKFAST 30 capsule 3  . FLUoxetine (PROZAC) 10 MG capsule Take 3 capsules (30 mg total) by mouth daily. 90 capsule 3  . furosemide (LASIX) 20 MG tablet TAKE 1-2 TABLETS BY MOUTH DAILY AS NEEDED FOR FLUID OR EDEMA 40 tablet PRN  . gabapentin (NEURONTIN) 300 MG capsule Take 3 capsules (900 mg total) by mouth at bedtime. (Patient taking differently: Take 900 mg by mouth 2 (two) times daily. ) 90 capsule 3  . glucose blood (TRUE CARE TEST STRIP PACK) test strip Use as instructed 100 each 3  . hyoscyamine (LEVSIN, ANASPAZ) 0.125 MG tablet Take 1 tablet (0.125 mg total) by mouth every 4 (four) hours as needed for cramping. 40 tablet 1  . ipratropium (ATROVENT) 0.06 % nasal spray Place 2 sprays into both nostrils 4 (four) times daily. 15 mL 12  . latanoprost (XALATAN) 0.005 % ophthalmic solution Place 1 drop into both eyes at bedtime.    . metFORMIN (GLUCOPHAGE-XR) 500 MG 24 hr tablet TAKE 2 TABLETS BY MOUTH DAILY WITH BREAKFAST. 60 tablet 11  . Spacer/Aero-Holding Dorise Bullion Use as directed 1 each 1  . spironolactone (ALDACTONE) 25 MG tablet Take 1 tablet (25 mg total) by mouth 2 (two) times daily. 60 tablet 1  . SYNTHROID 100 MCG tablet TAKE 1 TABLET (100 MCG TOTAL) BY MOUTH DAILY BEFORE BREAKFAST. 60 tablet 2  . TRUEPLUS LANCETS 30G MISC 1 Device by Does not apply route daily as needed. DX 250.00 by Does not apply route daily as needed. DX 250.00 100 each 2  . TRUEPLUS LANCETS 30G MISC Use.    . venlafaxine XR (EFFEXOR XR) 150 MG 24 hr capsule Take 1 capsule (150 mg total) by mouth daily with breakfast. 30 capsule 3  . venlafaxine XR (EFFEXOR-XR) 150 MG 24 hr capsule TAKE 1 CAPSULE BY MOUTH DAILY. 30 capsule 2  . rosuvastatin (CRESTOR) 5 MG tablet TAKE 5 MG TABLET 2 DAYS A WEEK. (Patient not taking: Reported on 03/22/2015) 15 tablet 3   No current facility-administered medications on file prior to  visit.    Allergies  Allergen Reactions  . Losartan Cough  . Aspirin     REACTION: Upset stomach  . Atorvastatin     myalgias  . Codeine   . Amoxicillin Rash    Review of Systems   Constitutional: Negative for fever. Positive for malaise/fatigue.  HENT: Negative for congestion.  Eyes: Negative for discharge.  Respiratory: Negative for shortness of breath.  Cardiovascular: Negative for chest pain, palpitations and leg swelling.  Gastrointestinal: Negative for nausea, abdominal pain and diarrhea.  Genitourinary: Negative for dysuria and urgency, hematuria and flank pain.  Musculoskeletal: Positive for myalgias  Skin: Negative for rash.  Neurological: Negative for loss of consciousness and headaches.  Endo/Heme/Allergies: Negative for polydipsia.  Psychiatric/Behavioral: Negative for depression and suicidal ideas. The patient is not nervous/anxious and does not have insomnia.   Objective  Pulse 99  Temp(Src) 98.9 F (37.2 C) (Oral)  Ht 5\' 5"  (1.651 m)  Wt 247 lb 2 oz (112.095 kg)  BMI 41.12 kg/m2  SpO2 100%  Physical Exam   Constitutional: Oriented to person, place, and time. Appears well-nourished. No distress.  Eyes: EOM are normal. Pupils are equal, round, and reactive to light.  Cardiovascular: Normal rate and regular rhythm.  Pulmonary/Chest: Breath sounds normal.  Abdominal: Soft. Bowel sounds are normal.  Lymphadenopathy:   No cervical adenopathy.  Neurological: Alert and oriented to person, place, and time. Normal reflexes. No cranial nerve deficit.    Assessment & Plan  Hypothyroidism -On Levothyroxine -Seeing Dr. Derrill Kay for treatment -TSH level wnl -Free T3/T4 for further investigation of lethargy  Diabetes Mellitus Type 2, controlled -HgA1c acceptable -Minimize simple carbs, increase exercise as tolerated -Continue current meds -Foot exam performed -Seeing Dr. Posey Pronto for ophtho  GERD -Avoid offending foods, start probiotics.  -Do not  eat large meals in late evening and consider raising head of bed.  -Continue current medications  Obesity -Encouraged decrease po intake and increase exercise as tolerated.  -Avoid trans fats, eat small, frequent meals every 4-5 hours with lean proteins, complex carbs and healthy fats. Minimize simple carbs, GMO foods. -Increase exercise as tolerated  Hyperlipemia, mixed -Encouraged heart healthy diet, increase exercise, avoid trans fats, consider a krill oil cap daily.  -Cannot tolerate statins -Restart fibrates  Muscle Pains -Generalized aches that effect daily life -Refer to rheumatologist for evaluation -Will add 100mg  of gabapentin in morning and monitor sedation symptoms  AP (abdominal pain) -No recent episodes, doing better no need for hyoscyamine -Occurs when she takes Aleve  Anxiety/Depression -Did better on the Fluoxetine 10 mg tabs 3 tabs daily, continue Venlafaxine  Hoarseness of voice -Follows with [facility] who will continue to manage  Preventative -Getting mammogram in January  -Pneumovax    Patient seen and examined with student, agree with documentation. See separate note for documentation

## 2015-03-22 NOTE — Patient Instructions (Addendum)
Vitamin D 2000 IU daily  Start Gabapentin 100 mg in morning can increase 3 x daily  Fatigue Fatigue is feeling tired all of the time, a lack of energy, or a lack of motivation. Occasional or mild fatigue is often a normal response to activity or life in general. However, long-lasting (chronic) or extreme fatigue may indicate an underlying medical condition. HOME CARE INSTRUCTIONS  Watch your fatigue for any changes. The following actions may help to lessen any discomfort you are feeling:  Talk to your health care provider about how much sleep you need each night. Try to get the required amount every night.  Take medicines only as directed by your health care provider.  Eat a healthy and nutritious diet. Ask your health care provider if you need help changing your diet.  Drink enough fluid to keep your urine clear or pale yellow.  Practice ways of relaxing, such as yoga, meditation, massage therapy, or acupuncture.  Exercise regularly.   Change situations that cause you stress. Try to keep your work and personal routine reasonable.  Do not abuse illegal drugs.  Limit alcohol intake to no more than 1 drink per day for nonpregnant women and 2 drinks per day for men. One drink equals 12 ounces of beer, 5 ounces of wine, or 1 ounces of hard liquor.  Take a multivitamin, if directed by your health care provider. SEEK MEDICAL CARE IF:   Your fatigue does not get better.  You have a fever.   You have unintentional weight loss or gain.  You have headaches.   You have difficulty:   Falling asleep.  Sleeping throughout the night.  You feel angry, guilty, anxious, or sad.   You are unable to have a bowel movement (constipation).   You skin is dry.   Your legs or another part of your body is swollen.  SEEK IMMEDIATE MEDICAL CARE IF:   You feel confused.   Your vision is blurry.  You feel faint or pass out.   You have a severe headache.   You have severe  abdominal, pelvic, or back pain.   You have chest pain, shortness of breath, or an irregular or fast heartbeat.   You are unable to urinate or you urinate less than normal.   You develop abnormal bleeding, such as bleeding from the rectum, vagina, nose, lungs, or nipples.  You vomit blood.   You have thoughts about harming yourself or committing suicide.   You are worried that you might harm someone else.    This information is not intended to replace advice given to you by your health care provider. Make sure you discuss any questions you have with your health care provider.   Document Released: 02/05/2007 Document Revised: 05/01/2014 Document Reviewed: 08/12/2013 Elsevier Interactive Patient Education [employer].

## 2015-03-26 ENCOUNTER — Encounter: Payer: Self-pay | Admitting: Family Medicine

## 2015-03-26 ENCOUNTER — Other Ambulatory Visit (HOSPITAL_COMMUNITY): Payer: Self-pay | Admitting: Rehabilitation

## 2015-03-26 DIAGNOSIS — M5416 Radiculopathy, lumbar region: Secondary | ICD-10-CM

## 2015-03-27 DIAGNOSIS — M791 Myalgia, unspecified site: Secondary | ICD-10-CM | POA: Insufficient documentation

## 2015-03-27 NOTE — Assessment & Plan Note (Signed)
Well controlled, no changes to meds. Encouraged heart healthy diet such as the DASH diet and exercise as tolerated.  °

## 2015-03-27 NOTE — Assessment & Plan Note (Signed)
Avoid offending foods, start probiotics. Do not eat large meals in late evening and consider raising head of bed.  

## 2015-03-27 NOTE — Assessment & Plan Note (Signed)
Worsening and patient struggling with worsening fatigue, is referred to rheumatology for further consideration

## 2015-03-27 NOTE — Progress Notes (Signed)
Subjective:    Patient ID: [name], female    DOB: November 18, 1960, 54 y.o.   MRN: [id number]  Chief Complaint  Patient presents with  . Follow-up    HPI Patient is in today for follow-up. She continues to struggle with many of the same complaints but is worsening unfortunately. She has a persistent cough that is nonproductive. She has persistent hoarseness although it is not worsening. She continues to follow a subspecialist. She stopped Crestor for worsening myalgias. She is following at [facility] for her back pain. No recent febrile illness. Is noting worsening myalgias, fatigue and depression. No suicidal ideation. Denies CP/palp/SOB/HA/congestion/fevers/GI or GU c/o. Taking meds as prescribed  Past Medical History  Diagnosis Date  . GERD (gastroesophageal reflux disease)   . Hypertension   . Thyroid disease     hypothyroidism  . Anxiety   . Depression   . Hyperlipidemia   . Chronic rhinosinusitis   . OSA (obstructive sleep apnea)   . Obesity   . Colon polyps   . Fibroid, uterine   . Hoarseness   . Glaucoma 12-13  . IBS (irritable bowel syndrome) 02/13/2011  . Perimenopause 10/05/2012  . Edema 01/06/2013  . Diabetes ([place]) 01/06/2013  . Chronic pain syndrome 01/06/2013  . Cough 01/06/2013  . Hypokalemia 02/05/2013  . Low back pain 03/03/2013  . Hyperglycemia 04/13/2013  . Hoarseness of voice 05/11/2013  . Obesity, unspecified 05/11/2013  . Raynaud disease 06/16/2013  . Hyperlipemia, mixed 06/06/2007    Qualifier: Diagnosis of  By: Wynona Luna She feels Lipitor caused increased low back pain and weakness    . Muscle cramp 05/22/2014  . Diabetes mellitus type 2 in obese [facility]) 01/06/2013    Sees Dr Calvert Cantor for eye exam Does not see podiatry, foot exam today unremarkable except for thick cracking skin on heals     Past Surgical History  Procedure Laterality Date  . Cholecystectomy    . Abdominal hysterectomy      Family History  Problem Relation Age of Onset  .  Alcohol abuse Father   . Cancer Father     renal and colon  . Hyperlipidemia Father   . Hypertension Father   . Diabetes    . Cancer Paternal Grandfather     colon    Social History   Social History  . Marital Status: Single    Spouse Name: N/A  . Number of Children: N/A  . Years of Education: N/A   Occupational History  . Not on file.   Social History Main Topics  . Smoking status: Former Smoker -- 0.50 packs/day for 30 years    Types: Cigarettes    Quit date: 04/25/2003  . Smokeless tobacco: Never Used     Comment: smoked since age 69  . Alcohol Use: No  . Drug Use: Not on file  . Sexual Activity: Not on file   Other Topics Concern  . Not on file   Social History Narrative   Endo-- Dr Dwyane Dee   ENT--Dr shoemaker   GI--Dr Buccini   Pulm--Dr Clance   Rheum--Dr Charlestine Night          Outpatient Prescriptions Prior to Visit  Medication Sig Dispense Refill  . albuterol (PROVENTIL HFA;VENTOLIN HFA) 108 (90 BASE) MCG/ACT inhaler Inhale 2 puffs into the lungs every 4 (four) hours as needed for wheezing or shortness of breath. 1 Inhaler 6  . ALPRAZolam (XANAX) 0.25 MG tablet TAKE 1/2 TABLET BY MOUTH 2  TIMES A DAY FOR 14 DAYS, THEN 1/2 TAB DAILY FOR 7 DAYS, THE 1/2 - 1 TAB DAILY AS NEEDED 40 tablet 1  . BYSTOLIC 20 MG TABS TAKE 1 TABLET BY MOUTH ONCE DAILY 30 tablet 6  . clotrimazole (LOTRIMIN) 1 % cream Apply topically 2 (two) times daily. 60 g 1  . fenofibrate micronized (ANTARA) 130 MG capsule TAKE 1 CAPSULE BY MOUTH DAILY BEFORE BREAKFAST 30 capsule 3  . FLUoxetine (PROZAC) 10 MG capsule Take 3 capsules (30 mg total) by mouth daily. 90 capsule 3  . furosemide (LASIX) 20 MG tablet TAKE 1-2 TABLETS BY MOUTH DAILY AS NEEDED FOR FLUID OR EDEMA 40 tablet PRN  . gabapentin (NEURONTIN) 300 MG capsule Take 3 capsules (900 mg total) by mouth at bedtime. (Patient taking differently: Take 900 mg by mouth 2 (two) times daily. ) 90 capsule 3  . glucose blood (TRUE CARE TEST STRIP PACK)  test strip Use as instructed 100 each 3  . hyoscyamine (LEVSIN, ANASPAZ) 0.125 MG tablet Take 1 tablet (0.125 mg total) by mouth every 4 (four) hours as needed for cramping. 40 tablet 1  . ipratropium (ATROVENT) 0.06 % nasal spray Place 2 sprays into both nostrils 4 (four) times daily. 15 mL 12  . latanoprost (XALATAN) 0.005 % ophthalmic solution Place 1 drop into both eyes at bedtime.    . metFORMIN (GLUCOPHAGE-XR) 500 MG 24 hr tablet TAKE 2 TABLETS BY MOUTH DAILY WITH BREAKFAST. 60 tablet 11  . Spacer/Aero-Holding Dorise Bullion Use as directed 1 each 1  . spironolactone (ALDACTONE) 25 MG tablet Take 1 tablet (25 mg total) by mouth 2 (two) times daily. 60 tablet 1  . SYNTHROID 100 MCG tablet TAKE 1 TABLET (100 MCG TOTAL) BY MOUTH DAILY BEFORE BREAKFAST. 60 tablet 2  . TRUEPLUS LANCETS 30G MISC 1 Device by Does not apply route daily as needed. DX 250.00 by Does not apply route daily as needed. DX 250.00 100 each 2  . TRUEPLUS LANCETS 30G MISC Use.    . venlafaxine XR (EFFEXOR XR) 150 MG 24 hr capsule Take 1 capsule (150 mg total) by mouth daily with breakfast. 30 capsule 3  . venlafaxine XR (EFFEXOR-XR) 150 MG 24 hr capsule TAKE 1 CAPSULE BY MOUTH DAILY. 30 capsule 2  . rosuvastatin (CRESTOR) 5 MG tablet TAKE 5 MG TABLET 2 DAYS A WEEK. (Patient not taking: Reported on 03/22/2015) 15 tablet 3   No facility-administered medications prior to visit.    Allergies  Allergen Reactions  . Losartan Cough  . Aspirin     REACTION: Upset stomach  . Atorvastatin     myalgias  . Codeine   . Amoxicillin Rash    Review of Systems  Constitutional: Positive for malaise/fatigue. Negative for fever.  HENT: Positive for congestion.   Eyes: Negative for discharge.  Respiratory: Positive for cough. Negative for shortness of breath.   Cardiovascular: Negative for chest pain, palpitations and leg swelling.  Gastrointestinal: Negative for nausea and abdominal pain.  Genitourinary: Negative for dysuria.    Musculoskeletal: Positive for myalgias, back pain, joint pain and neck pain. Negative for falls.  Skin: Negative for rash.  Neurological: Negative for loss of consciousness and headaches.  Endo/Heme/Allergies: Negative for environmental allergies.  Psychiatric/Behavioral: Positive for depression. The patient is nervous/anxious.        Objective:    Physical Exam  Constitutional: She is oriented to person, place, and time. She appears well-developed and well-nourished. No distress.  HENT:  Head: Normocephalic and  atraumatic.  Nose: Nose normal.  Eyes: Right eye exhibits no discharge. Left eye exhibits no discharge.  Neck: Normal range of motion. Neck supple.  Cardiovascular: Normal rate and regular rhythm.   No murmur heard. Pulmonary/Chest: Effort normal and breath sounds normal.  Abdominal: Soft. Bowel sounds are normal. There is no tenderness.  Musculoskeletal: She exhibits no edema.  Neurological: She is alert and oriented to person, place, and time.  Skin: Skin is warm and dry.  Psychiatric: She has a normal mood and affect.  Nursing note and vitals reviewed.   Pulse 99  Temp(Src) 98.9 F (37.2 C) (Oral)  Ht 5\' 5"  (1.651 m)  Wt 247 lb 2 oz (112.095 kg)  BMI 41.12 kg/m2  SpO2 100% Wt Readings from Last 3 Encounters:  03/22/15 247 lb 2 oz (112.095 kg)  12/21/14 244 lb 6 oz (110.848 kg)  09/11/14 246 lb 6 oz (111.755 kg)     Lab Results  Component Value Date   WBC 6.1 03/15/2015   HGB 13.6 03/15/2015   HCT 40.8 03/15/2015   PLT 329.0 03/15/2015   GLUCOSE 86 03/15/2015   CHOL 226* 03/15/2015   TRIG 144.0 03/15/2015   HDL 56.60 03/15/2015   LDLDIRECT 160.8 01/30/2014   LDLCALC 141* 03/15/2015   ALT 24 03/15/2015   AST 18 03/15/2015   NA 139 03/15/2015   K 4.0 03/15/2015   CL 104 03/15/2015   CREATININE 1.04 03/15/2015   BUN 11 03/15/2015   CO2 26 03/15/2015   TSH 1.77 03/15/2015   HGBA1C 6.3 03/15/2015   MICROALBUR <0.7 09/04/2014    Lab Results   Component Value Date   TSH 1.77 03/15/2015   Lab Results  Component Value Date   WBC 6.1 03/15/2015   HGB 13.6 03/15/2015   HCT 40.8 03/15/2015   MCV 89.1 03/15/2015   PLT 329.0 03/15/2015   Lab Results  Component Value Date   NA 139 03/15/2015   K 4.0 03/15/2015   CO2 26 03/15/2015   GLUCOSE 86 03/15/2015   BUN 11 03/15/2015   CREATININE 1.04 03/15/2015   BILITOT 0.3 03/15/2015   ALKPHOS 65 03/15/2015   AST 18 03/15/2015   ALT 24 03/15/2015   PROT 6.7 03/15/2015   ALBUMIN 4.1 03/15/2015   CALCIUM 9.4 03/15/2015   GFR 70.94 03/15/2015   Lab Results  Component Value Date   CHOL 226* 03/15/2015   Lab Results  Component Value Date   HDL 56.60 03/15/2015   Lab Results  Component Value Date   LDLCALC 141* 03/15/2015   Lab Results  Component Value Date   TRIG 144.0 03/15/2015   Lab Results  Component Value Date   CHOLHDL 4 03/15/2015   Lab Results  Component Value Date   HGBA1C 6.3 03/15/2015       Assessment & Plan:   Problem List Items Addressed This Visit    Diabetes mellitus type 2 in obese ([place])    hgba1c acceptable, minimize simple carbs. Increase exercise as tolerated. Continue current meds      Essential hypertension    Well controlled, no changes to meds. Encouraged heart healthy diet such as the DASH diet and exercise as tolerated.       GERD    Avoid offending foods, start probiotics. Do not eat large meals in late evening and consider raising head of bed.       Hoarseness of voice    Continues to struggle and follows with subspecialty, no concerning pathology diagnosed thus  far.       Hypothyroidism    Encouraged heart healthy diet, increase exercise, avoid trans fats, consider a krill oil cap daily      Myalgia - Primary    Worsening and patient struggling with worsening fatigue, is referred to rheumatology for further consideration      Relevant Orders   T3, free (Completed)   T4, free (Completed)   Ambulatory referral to  Rheumatology   Obesity    Encouraged DASH diet, decrease po intake and increase exercise as tolerated. Needs 7-8 hours of sleep nightly. Avoid trans fats, eat small, frequent meals every 4-5 hours with lean proteins, complex carbs and healthy fats. Minimize simple carbs       Other Visit Diagnoses    Thyroid disease        Relevant Orders    T3, free (Completed)    T4, free (Completed)    Depression with anxiety        Relevant Orders    Ambulatory referral to Psychology    Need for 23-polyvalent pneumococcal polysaccharide vaccine        Relevant Orders    Pneumococcal polysaccharide vaccine 23-valent greater than or equal to 2yo subcutaneous/IM (Completed)       I have discontinued Ms. Papageorgiou rosuvastatin. I am also having her start on gabapentin and Vitamin D (Ergocalciferol). Additionally, I am having her maintain her clotrimazole, latanoprost, TRUEPLUS LANCETS 30G, glucose blood, hyoscyamine, gabapentin, metFORMIN, spironolactone, TRUEPLUS LANCETS 30G, Spacer/Aero-Holding Chambers, ipratropium, albuterol, venlafaxine XR, ALPRAZolam, SYNTHROID, FLUoxetine, BYSTOLIC, fenofibrate micronized, furosemide, and venlafaxine XR.  Meds ordered this encounter  Medications  . gabapentin (NEURONTIN) 100 MG capsule    Sig: Take 1 capsule (100 mg total) by mouth 3 (three) times daily.    Dispense:  90 capsule    Refill:  3  . Vitamin D, Ergocalciferol, (DRISDOL) 50000 UNITS CAPS capsule    Sig: Take 1 capsule (50,000 Units total) by mouth every 7 (seven) days.    Dispense:  12 capsule    Refill:  1     BLYTH, STACEY, MD

## 2015-03-27 NOTE — Assessment & Plan Note (Signed)
Encouraged heart healthy diet, increase exercise, avoid trans fats, consider a krill oil cap daily 

## 2015-03-27 NOTE — Assessment & Plan Note (Signed)
Continues to struggle and follows with subspecialty, no concerning pathology diagnosed thus far.

## 2015-03-27 NOTE — Assessment & Plan Note (Signed)
Encouraged DASH diet, decrease po intake and increase exercise as tolerated. Needs 7-8 hours of sleep nightly. Avoid trans fats, eat small, frequent meals every 4-5 hours with lean proteins, complex carbs and healthy fats. Minimize simple carbs 

## 2015-03-27 NOTE — Assessment & Plan Note (Signed)
hgba1c acceptable, minimize simple carbs. Increase exercise as tolerated. Continue current meds 

## 2015-04-01 ENCOUNTER — Telehealth: Payer: Self-pay | Admitting: Family Medicine

## 2015-04-01 ENCOUNTER — Encounter: Payer: Self-pay | Admitting: Physical Therapy

## 2015-04-01 NOTE — Telephone Encounter (Signed)
Called pt, she has had her flu shot at work on 10/18.

## 2015-04-01 NOTE — Telephone Encounter (Signed)
Already documented.

## 2015-04-02 ENCOUNTER — Ambulatory Visit (HOSPITAL_COMMUNITY)
Admission: RE | Admit: 2015-04-02 | Discharge: 2015-04-02 | Disposition: A | Discharge status: Home / Self Care | Payer: 59 | Source: Ambulatory Visit | Attending: Rehabilitation | Admitting: Rehabilitation

## 2015-04-02 ENCOUNTER — Telehealth: Payer: Self-pay | Admitting: Family Medicine

## 2015-04-02 ENCOUNTER — Other Ambulatory Visit: Payer: Self-pay | Admitting: Family Medicine

## 2015-04-02 DIAGNOSIS — R29890 Loss of height: Secondary | ICD-10-CM | POA: Insufficient documentation

## 2015-04-02 DIAGNOSIS — M4806 Spinal stenosis, lumbar region: Secondary | ICD-10-CM | POA: Insufficient documentation

## 2015-04-02 DIAGNOSIS — M5126 Other intervertebral disc displacement, lumbar region: Secondary | ICD-10-CM | POA: Diagnosis not present

## 2015-04-02 DIAGNOSIS — M5416 Radiculopathy, lumbar region: Secondary | ICD-10-CM | POA: Diagnosis present

## 2015-04-02 DIAGNOSIS — M1288 Other specific arthropathies, not elsewhere classified, other specified site: Secondary | ICD-10-CM | POA: Diagnosis not present

## 2015-04-02 MED ORDER — GABAPENTIN 800 MG PO TABS: 800.0000 mg | ORAL_TABLET | Freq: Every day | ORAL | Status: DC

## 2015-04-02 NOTE — Telephone Encounter (Signed)
>','<<   Less Detail',event)" href="javascript:;">More Detail >>   RE: RE: Non-Urgent Medical Question    Mosie Lukes, MD    Sent: Fri April 02, 2015 9:39 AM    To: [name] [name], CMA        Message     Got it. What I would like to propose is we switch her from 300 mg caps 3 qhs to 800 mg tabs 1 po qhs and keep the 100 mg caps 1 tab po bid in am and midday. Disp 30 day supply of the 800 mg with 5 rf or 90 d supply with 1 rf. Dr B    ----- Message -----     From: [name]     Sent: 04/02/2015  9:31 AM      To: Mosie Lukes, MD    Subject: RE: RE: Non-Urgent Medical Question             ----- Message from Jamesetta Orleans, [place] sent at 04/02/2015 9:31 AM EST -----            ----- Message sent from [place], [place] to [name] at 04/02/2015 9:22 AM -----     [name],    Dr. Charlett Blake has approved refilling your gabapentin, but needs you to let us know by mychart of phone call how you are taking it. I left a message on your cell and home number to call if you wish or you can simply message [place] on mychart your gabapentin instructions. Also let us know which pharmacy to send in to.    Thanks,    Shirlean Mylar        ----- Message -----     From: [name]     Sent: 04/01/2015 6:56 AM EST      To: Penni Homans, MD    Subject: RE: RE: Non-Urgent Medical Question        Dr Charlett Blake the pharmacy will not release my 300 Gabapentin because you have not ok it I am out of my 300 mg     Thanks         ----- Message -----    From: Penni Homans, MD    Sent: 03/27/15, 12:26 PM    To: [name]    Subject: RE: Non-Urgent Medical Question        The exact dosing depends on symptom control and side effects. Would recommend 600 to 900 mg at bedtime and 100 mg 2 to 3 x daily during the day as needed. Dr B        ----- Message -----     From: [name]      Sent: 03/26/2015 12:58 PM EST      To: Penni Homans, MD    Subject: Non-Urgent Medical Question        Dr Charlett Blake the a pharmacy     need a clarification on my Gabapentin dose. I need 122mcg 3 time a day (B /L and D) then I need 300 mg time 3 ( total 900 mg ) @ bedtime.         Patient has been informed of changes and pharmacy as well.  Wanted all mychart messaging to be documented

## 2015-04-02 NOTE — Telephone Encounter (Signed)
Spoke to the patient.  She has been on Gabapentin 300 mg take 3 every night, she has been on almost a year .  AT her last OV PCP addded gabapentin 100 mg tid. She did say if you want her only to do the 300 mg 3 qhs, that has really helped her a lot.  Advise please.

## 2015-04-02 NOTE — Telephone Encounter (Signed)
Updated medication list per mychart request/PCP instructions

## 2015-04-08 ENCOUNTER — Ambulatory Visit: Payer: 59 | Attending: Rehabilitation | Admitting: Physical Therapy

## 2015-04-08 ENCOUNTER — Other Ambulatory Visit: Payer: Self-pay | Admitting: Internal Medicine

## 2015-04-08 DIAGNOSIS — M545 Low back pain, unspecified: Secondary | ICD-10-CM

## 2015-04-08 DIAGNOSIS — R262 Difficulty in walking, not elsewhere classified: Secondary | ICD-10-CM | POA: Diagnosis present

## 2015-04-08 NOTE — Therapy (Signed)
[facility] [address] [place], [address] Phone: [phone]   Fax:  [phone]  Physical Therapy Evaluation  Patient Details  Name: [name] MRN: [id number] Date of Birth: Dec 27, 1960 Referring Provider: Sheffield Slider  Encounter Date: 04/08/2015      PT End of Session - 04/08/15 0943    Visit Number 1   Number of Visits 12   Date for PT Re-Evaluation 05/23/15   PT Start Time 0900   PT Stop Time 0940   PT Time Calculation (min) 40 min   Activity Tolerance Patient limited by pain   Behavior During Therapy [facility] for tasks assessed/performed      Past Medical History  Diagnosis Date  . GERD (gastroesophageal reflux disease)   . Hypertension   . Thyroid disease     hypothyroidism  . Anxiety   . Depression   . Hyperlipidemia   . Chronic rhinosinusitis   . OSA (obstructive sleep apnea)   . Obesity   . Colon polyps   . Fibroid, uterine   . Hoarseness   . Glaucoma 12-13  . IBS (irritable bowel syndrome) 02/13/2011  . Perimenopause 10/05/2012  . Edema 01/06/2013  . Diabetes ([place]) 01/06/2013  . Chronic pain syndrome 01/06/2013  . Cough 01/06/2013  . Hypokalemia 02/05/2013  . Low back pain 03/03/2013  . Hyperglycemia 04/13/2013  . Hoarseness of voice 05/11/2013  . Obesity, unspecified 05/11/2013  . Raynaud disease 06/16/2013  . Hyperlipemia, mixed 06/06/2007    Qualifier: Diagnosis of  By: Wynona Luna She feels Lipitor caused increased low back pain and weakness    . Muscle cramp 05/22/2014  . Diabetes mellitus type 2 in obese [facility]) 01/06/2013    Sees Dr Calvert Cantor for eye exam Does not see podiatry, foot exam today unremarkable except for thick cracking skin on heals     Past Surgical History  Procedure Laterality Date  . Cholecystectomy    . Abdominal hysterectomy      There were no vitals filed for this visit.  Visit Diagnosis:  Low back pain at multiple sites - Plan: PT plan of care  cert/re-cert  Difficulty walking - Plan: PT plan of care cert/re-cert      Subjective Assessment - 04/08/15 0858    Subjective Pt c/o back pain for "a while now". Pain is worsening when pt is standing or walking for long periods of time. Pt reports N/T additionally. Pain is now worsening.   Pertinent History Pt has had back pain since her 20's when her "back went out". She feels that she has always had trouble with her back. She has not been able to be active for any period of time.   Limitations Standing;Walking   How long can you stand comfortably? 5 min.   How long can you walk comfortably? variable but only able to walk for very short periods of time.   Diagnostic tests MRI -    Patient Stated Goals Reduce pain, incr. walking, be able to do housework.            [facility] PT Assessment - 04/08/15 0001    Assessment   Medical Diagnosis radiculopathy of lumbosacral region   Referring Provider [place] Right   Prior Therapy pt has had prior PT in 2007   Precautions   Precautions None   Restrictions   Weight Bearing Restrictions No   Balance Screen   Has the patient fallen  in the past 6 months No   Has the patient had a decrease in activity level because of a fear of falling?  No   Is the patient reluctant to leave their home because of a fear of falling?  No   Prior Function   Level of Independence Independent   Vocation Full time employment   Vocation Requirements sitting for 12 hours at a time   Leisure Pt is unable to shop or walk without pain but these are her leisure activities.   Posture/Postural Control   Posture Comments incr. lordosis, fhp.   ROM / Strength   AROM / PROM / Strength PROM;Strength   PROM   Overall PROM Comments generally tight in hips and poor lumbar flexion - generally reproduced pt pain. limited thoracic extension.    Strength   Overall Strength Comments weakness in TA - 4 sec. TA hold. pt is significantly deconditioned, 5x  sit<>stand - 20 sec. able to perform but required extended rest break following this.   Palpation   Spinal mobility deferred   Palpation comment reproduction of pain with palpation of L glute max   Transfers   Five time sit to stand comments  20 sec. and incr. pain, fatigue requiring rest break   Ambulation/Gait   Ambulation Distance (Feet) 250 Feet   Gait velocity .4 m/s    Gait Comments 250' in 3 min - unable to continue with walking due to fatigue and pain. Decr. stance time on L and generally slow.                           PT Education - 04/08/15 0941    Education provided Yes   Education Details Pt educated on plan of care.   Person(s) Educated Patient   Methods Explanation   Comprehension Verbalized understanding             PT Long Term Goals - 04/08/15 0947    PT LONG TERM GOAL #1   Title pt will be able to complete 6 MWT indicating improvement in basic functional mobility to be able to shop independently.   Baseline 3 min   Time 6   Period Weeks   Status New   PT LONG TERM GOAL #2   Title Pt will improve 5x sit<>stand to less than 12 sec. indicating decr. risk for adverse event   Baseline 20 sec.   Time 6   Period Weeks   Status New   PT LONG TERM GOAL #3   Title Pt will be I with HEP for the pool so she can continue to make progress on her own following PT.   Time 6   Period Weeks   Status New               Plan - 04/08/15 0944    Clinical Impression Statement Pt is apleasant 54 y/o female with c/o chronic back pain which is now limiting her ability to perform ADLs involving standing and walking. Pt is highly sedentary at work and home, sitting for 12 hrs at a time. Demonstrates significant weakness in LE and core as well is incr. lumbar lordosis which may be contributing to her pain. Pt would benefit from skilled PT to improve strength, decr. pain, and improve functional tolerance.   Pt will benefit from skilled therapeutic  intervention in order to improve on the following deficits Postural dysfunction;Decreased range of motion;Decreased strength;Pain   Rehab Potential Fair  Clinical Impairments Affecting Rehab Potential chronic pain, sedentary lifestyle, medical comorbidities, highly motivated   PT Frequency 2x / week   PT Duration 6 weeks   PT Treatment/Interventions Aquatic Therapy;ADLs/Self Care Home Management;Therapeutic exercise;Manual techniques   PT Next Visit Plan aquatic session   Consulted and Agree with Plan of Care Patient         Problem List Patient Active Problem List   Diagnosis Date Noted  . Myalgia 03/27/2015  . Muscle cramp 05/22/2014  . AP (abdominal pain) 02/15/2014  . Raynaud disease 06/16/2013  . Hoarseness of voice 05/11/2013  . Obesity 05/11/2013  . Low back pain 03/03/2013  . Hypokalemia 02/05/2013  . Edema 01/06/2013  . Diabetes mellitus type 2 in obese ([place]) 01/06/2013  . Chronic pain syndrome 01/06/2013  . Cough 01/06/2013  . Perimenopause 10/05/2012  . Glaucoma   . DOE (dyspnea on exertion) 09/10/2011  . IBS (irritable bowel syndrome) 02/13/2011  . Vitamin D deficiency 02/13/2011  . Paraesthesia of skin 08/31/2010  . RHINITIS, CHRONIC 01/14/2010  . BACK PAIN, LUMBAR, CHRONIC 06/16/2008  . THYROMEGALY 11/11/2007  . ALLERGIC RHINITIS 11/11/2007  . ECZEMA, HANDS 07/22/2007  . Hypothyroidism 06/06/2007  . Hyperlipemia, mixed 06/06/2007  . ADJ DISORDER WITH MIXED ANXIETY & DEPRESSED MOOD 06/06/2007  . Obstructive sleep apnea 06/06/2007  . Essential hypertension 06/06/2007  . GERD 06/06/2007  . COLONIC POLYPS, HX OF 06/06/2007    Jaegar Croft PT DPT 04/08/2015, 9:54 AM  [facility] [address] [place], [address] Phone: [phone]   Fax:  [phone]  Name: RITTANY HAVINS MRN: [id number] Date of Birth: [birth year]

## 2015-04-13 ENCOUNTER — Ambulatory Visit: Payer: 59 | Admitting: Physical Therapy

## 2015-04-13 DIAGNOSIS — M545 Low back pain, unspecified: Secondary | ICD-10-CM

## 2015-04-13 DIAGNOSIS — R262 Difficulty in walking, not elsewhere classified: Secondary | ICD-10-CM

## 2015-04-13 NOTE — Therapy (Addendum)
[facility] [address], [place], [address] Phone: [phone]   Fax:  [phone]  Physical Therapy Treatment  Patient Details  Name: [name] MRN: [id number] Date of Birth: [birth year] Referring Provider: Sheffield Slider  Encounter Date: 04/13/2015      PT End of Session - 04/13/15 1031    Visit Number 2   Number of Visits 12   Date for PT Re-Evaluation 05/23/15   PT Start Time 0945   PT Stop Time 6168   PT Time Calculation (min) 30 min   Activity Tolerance Patient tolerated treatment well;No increased pain   Behavior During Therapy [facility] for tasks assessed/performed      Past Medical History  Diagnosis Date  . GERD (gastroesophageal reflux disease)   . Hypertension   . Thyroid disease     hypothyroidism  . Anxiety   . Depression   . Hyperlipidemia   . Chronic rhinosinusitis   . OSA (obstructive sleep apnea)   . Obesity   . Colon polyps   . Fibroid, uterine   . Hoarseness   . Glaucoma 12-13  . IBS (irritable bowel syndrome) 02/13/2011  . Perimenopause 10/05/2012  . Edema 01/06/2013  . Diabetes ([place]) 01/06/2013  . Chronic pain syndrome 01/06/2013  . Cough 01/06/2013  . Hypokalemia 02/05/2013  . Low back pain 03/03/2013  . Hyperglycemia 04/13/2013  . Hoarseness of voice 05/11/2013  . Obesity, unspecified 05/11/2013  . Raynaud disease 06/16/2013  . Hyperlipemia, mixed 06/06/2007    Qualifier: Diagnosis of  By: Wynona Luna She feels Lipitor caused increased low back pain and weakness    . Muscle cramp 05/22/2014  . Diabetes mellitus type 2 in obese [facility]) 01/06/2013    Sees Dr Calvert Cantor for eye exam Does not see podiatry, foot exam today unremarkable except for thick cracking skin on heals     Past Surgical History  Procedure Laterality Date  . Cholecystectomy    . Abdominal hysterectomy      There were no vitals filed for this visit.  Visit Diagnosis:  Low back pain at multiple  sites  Difficulty walking      Subjective Assessment - 04/13/15 1028    Subjective Patient reports 8/10 back pain. Reports unable to perform household activities without sitting to rest frequently.    Pertinent History Pt has had back pain since her 20's when her "back went out". She feels that she has always had trouble with her back. She has not been able to be active for any period of time.   How long can you stand comfortably? 5 min.   How long can you walk comfortably? variable but only able to walk for very short periods of time.   Diagnostic tests MRI -    Patient Stated Goals Reduce pain, incr. walking, be able to do housework.   Currently in Pain? Yes   Pain Score 8    Pain Location Back   Pain Descriptors / Indicators Aching   Pain Type Chronic pain   Pain Onset More than a month ago   Pain Frequency Constant   Multiple Pain Sites No                     Adult Aquatic Therapy - 04/13/15 1029    Aquatic Therapy Subjective   Subjective Patient reports 8/10 pain at initiation of session   Treatment   Gait Pt entered/exited pool via ramp with  hha.  Patient ambulated 5 laps forward, 4 laps sidestepping in 4' water.    Exercises Standing exercises to include trunk rotation, core strengthening with arm flexion/extension, marching. x2 min each                    PT Education - 04/13/15 1031    Education provided Yes   Education Details oriented to pool, pool exercises,   Person(s) Educated Patient   Methods Explanation;Demonstration   Comprehension Verbalized understanding;Returned demonstration             PT Long Term Goals - 04/08/15 0947    PT LONG TERM GOAL #1   Title pt will be able to complete 6 MWT indicating improvement in basic functional mobility to be able to shop independently.   Baseline 3 min   Time 6   Period Weeks   Status New   PT LONG TERM GOAL #2   Title Pt will improve 5x sit<>stand to less than 12 sec. indicating  decr. risk for adverse event   Baseline 20 sec.   Time 6   Period Weeks   Status New   PT LONG TERM GOAL #3   Title Pt will be I with HEP for the pool so she can continue to make progress on her own following PT.   Time 6   Period Weeks   Status New               Plan - 04/13/15 1032    Clinical Impression Statement Patient has severe back pain that limits her ability to perform ADLs. Patient has decreased activity tolerance.    Pt will benefit from skilled therapeutic intervention in order to improve on the following deficits Postural dysfunction;Decreased range of motion;Decreased strength;Pain;Decreased activity tolerance   Rehab Potential Fair   Clinical Impairments Affecting Rehab Potential chronic pain, sedentary lifestyle, medical comorbidities, highly motivated   PT Frequency 2x / week   PT Duration 6 weeks   PT Treatment/Interventions Aquatic Therapy;ADLs/Self Care Home Management;Therapeutic exercise;Manual techniques   PT Next Visit Plan aquatic session   Consulted and Agree with Plan of Care Patient        Problem List Patient Active Problem List   Diagnosis Date Noted  . Myalgia 03/27/2015  . Muscle cramp 05/22/2014  . AP (abdominal pain) 02/15/2014  . Raynaud disease 06/16/2013  . Hoarseness of voice 05/11/2013  . Obesity 05/11/2013  . Low back pain 03/03/2013  . Hypokalemia 02/05/2013  . Edema 01/06/2013  . Diabetes mellitus type 2 in obese ([place]) 01/06/2013  . Chronic pain syndrome 01/06/2013  . Cough 01/06/2013  . Perimenopause 10/05/2012  . Glaucoma   . DOE (dyspnea on exertion) 09/10/2011  . IBS (irritable bowel syndrome) 02/13/2011  . Vitamin D deficiency 02/13/2011  . Paraesthesia of skin 08/31/2010  . RHINITIS, CHRONIC 01/14/2010  . BACK PAIN, LUMBAR, CHRONIC 06/16/2008  . THYROMEGALY 11/11/2007  . ALLERGIC RHINITIS 11/11/2007  . ECZEMA, HANDS 07/22/2007  . Hypothyroidism 06/06/2007  . Hyperlipemia, mixed 06/06/2007  . ADJ DISORDER  WITH MIXED ANXIETY & DEPRESSED MOOD 06/06/2007  . Obstructive sleep apnea 06/06/2007  . Essential hypertension 06/06/2007  . GERD 06/06/2007  . COLONIC POLYPS, HX OF 06/06/2007    Robertlee Rogacki, PT, MPT, GCS 04/13/2015, 10:34 AM  [facility] [address] [place], [address] Phone: [phone]   Fax:  [phone]  Name: [name] MRN: [id number] Date of Birth: 07-Aug-1960  PHYSICAL THERAPY DISCHARGE SUMMARY  Visits from Start of Care:2   Plan: Patient agrees to discharge.  Patient goals were not met. Patient is being discharged due to not returning since the last visit.  ?????       Lyndee Hensen, PT, DPT 11:57 AM  07/09/18

## 2015-04-14 ENCOUNTER — Encounter: Payer: 59 | Admitting: Physical Therapy

## 2015-04-21 ENCOUNTER — Encounter: Payer: Self-pay | Admitting: *Deleted

## 2015-04-21 NOTE — Patient Outreach (Signed)
Lorree has not been seen for [facility] follow up in over and year and did not keep her appointment on 6/6 nor call to reschedule. A termination letter is being sent to her for failure to keep the program's appointment policy. [name] RN,CCM,CDE [place] Management Coordinator [facility] Office Phone [phone] Office Fax [phone]

## 2015-04-22 ENCOUNTER — Encounter: Payer: 59 | Admitting: Physical Therapy

## 2015-04-26 ENCOUNTER — Encounter: Payer: Self-pay | Admitting: Physical Therapy

## 2015-04-27 ENCOUNTER — Ambulatory Visit: Payer: 59 | Admitting: Physical Therapy

## 2015-04-29 ENCOUNTER — Encounter: Payer: 59 | Admitting: Physical Therapy

## 2015-05-03 ENCOUNTER — Telehealth: Payer: Self-pay | Admitting: Family Medicine

## 2015-05-03 NOTE — Telephone Encounter (Signed)
Fine to change gabapentin as requested; give 30 day supply with 5 rf or 90 d with 1 patient choice

## 2015-05-03 NOTE — Telephone Encounter (Signed)
Pharmacy fax stating the patient would like to Change her Gabapentin 800 mg tablet.  States is very large in size.  May they change to Gabapentin 400 mg capsule, 2 caps qhs??? [employer].

## 2015-05-04 ENCOUNTER — Ambulatory Visit: Payer: 59 | Admitting: Physical Therapy

## 2015-05-04 MED ORDER — GABAPENTIN 400 MG PO CAPS: 400.0000 mg | ORAL_CAPSULE | Freq: Two times a day (BID) | ORAL | Status: DC

## 2015-05-04 MED FILL — GABAPENTIN 400 MG CAPSULE: 400 | 30 days supply | Qty: 60 | Fill #0

## 2015-05-04 NOTE — Telephone Encounter (Signed)
Sent in as requested 

## 2015-05-05 DIAGNOSIS — M5417 Radiculopathy, lumbosacral region: Secondary | ICD-10-CM | POA: Diagnosis not present

## 2015-05-06 ENCOUNTER — Other Ambulatory Visit: Payer: Self-pay | Admitting: Family Medicine

## 2015-05-06 MED FILL — FLUoxetine HCL 10 MG CAPS: 10 | 30 days supply | Qty: 90 | Fill #0

## 2015-05-06 MED FILL — VENLAFAXINE HCL ER 150 MG C: 150 | 30 days supply | Qty: 30 | Fill #2

## 2015-05-10 ENCOUNTER — Ambulatory Visit: Payer: 59 | Admitting: Physical Therapy

## 2015-05-19 MED FILL — FUROSEMIDE 20 MG TABLET: 20 | 20 days supply | Qty: 40 | Fill #5

## 2015-06-03 ENCOUNTER — Other Ambulatory Visit: Payer: Self-pay | Admitting: Family Medicine

## 2015-06-03 MED FILL — VENLAFAXINE HCL ER 150 MG C: 150 | 30 days supply | Qty: 30 | Fill #0

## 2015-06-03 MED FILL — FENOFIBRATE 130 MG CAPSULE: 130 | 30 days supply | Qty: 30 | Fill #3

## 2015-06-03 MED FILL — SPIRONOLACTONE 25 MG TABLET: 25 | 30 days supply | Qty: 60 | Fill #7

## 2015-06-03 MED FILL — FLUoxetine HCL 10 MG CAPS: 10 | 30 days supply | Qty: 90 | Fill #0

## 2015-06-03 MED FILL — SYNTHROID 100 MCG TABLET: 100 | 60 days supply | Qty: 60 | Fill #1

## 2015-06-03 MED FILL — GABAPENTIN 400 MG CAPSULE: 400 | 30 days supply | Qty: 60 | Fill #1

## 2015-06-03 MED FILL — FUROSEMIDE 20 MG TABLET: 20 | 20 days supply | Qty: 40 | Fill #6

## 2015-06-11 MED FILL — VIT D2 1.25 MG (50,000 UNIT: 1.25 MG | 84 days supply | Qty: 12 | Fill #1

## 2015-06-21 ENCOUNTER — Ambulatory Visit (INDEPENDENT_AMBULATORY_CARE_PROVIDER_SITE_OTHER): Payer: 59 | Admitting: Family Medicine

## 2015-06-21 ENCOUNTER — Encounter: Payer: Self-pay | Admitting: Family Medicine

## 2015-06-21 ENCOUNTER — Other Ambulatory Visit: Payer: Self-pay | Admitting: Family Medicine

## 2015-06-21 VITALS — BP 131/76 | HR 66 | Temp 98.8°F | Resp 16 | Ht 65.0 in | Wt 251.8 lb

## 2015-06-21 DIAGNOSIS — M25512 Pain in left shoulder: Secondary | ICD-10-CM | POA: Diagnosis not present

## 2015-06-21 DIAGNOSIS — E782 Mixed hyperlipidemia: Secondary | ICD-10-CM

## 2015-06-21 DIAGNOSIS — E039 Hypothyroidism, unspecified: Secondary | ICD-10-CM

## 2015-06-21 DIAGNOSIS — I10 Essential (primary) hypertension: Secondary | ICD-10-CM | POA: Diagnosis not present

## 2015-06-21 DIAGNOSIS — M719 Bursopathy, unspecified: Secondary | ICD-10-CM

## 2015-06-21 DIAGNOSIS — E1169 Type 2 diabetes mellitus with other specified complication: Secondary | ICD-10-CM

## 2015-06-21 DIAGNOSIS — E669 Obesity, unspecified: Principal | ICD-10-CM

## 2015-06-21 DIAGNOSIS — M75102 Unspecified rotator cuff tear or rupture of left shoulder, not specified as traumatic: Secondary | ICD-10-CM

## 2015-06-21 DIAGNOSIS — E559 Vitamin D deficiency, unspecified: Secondary | ICD-10-CM

## 2015-06-21 DIAGNOSIS — F4323 Adjustment disorder with mixed anxiety and depressed mood: Secondary | ICD-10-CM

## 2015-06-21 DIAGNOSIS — K219 Gastro-esophageal reflux disease without esophagitis: Secondary | ICD-10-CM | POA: Diagnosis not present

## 2015-06-21 DIAGNOSIS — R6884 Jaw pain: Secondary | ICD-10-CM

## 2015-06-21 DIAGNOSIS — E119 Type 2 diabetes mellitus without complications: Secondary | ICD-10-CM

## 2015-06-21 LAB — COMPREHENSIVE METABOLIC PANEL
ALT: 28 U/L (ref 0–35)
AST: 29 U/L (ref 0–37)
Albumin: 4.5 g/dL (ref 3.5–5.2)
Alkaline Phosphatase: 49 U/L (ref 39–117)
BUN: 9 mg/dL (ref 6–23)
CO2: 24 mEq/L (ref 19–32)
Calcium: 9.6 mg/dL (ref 8.4–10.5)
Chloride: 107 mEq/L (ref 96–112)
Creatinine, Ser: 0.93 mg/dL (ref 0.40–1.20)
GFR: 80.63 mL/min (ref >60.00)
Glucose, Bld: 78 mg/dL (ref 70–99)
Potassium: 4 mEq/L (ref 3.5–5.1)
Sodium: 139 mEq/L (ref 135–145)
Total Bilirubin: 0.3 mg/dL (ref 0.2–1.2)
Total Protein: 7.4 g/dL (ref 6.0–8.3)

## 2015-06-21 LAB — CBC
HCT: 39.8 % (ref 36.0–46.0)
Hemoglobin: 13.4 g/dL (ref 12.0–15.0)
MCHC: 33.8 g/dL (ref 30.0–36.0)
MCV: 88.2 fl (ref 78.0–100.0)
Platelets: 339 10*3/uL (ref 150.0–400.0)
RBC: 4.51 Mil/uL (ref 3.87–5.11)
RDW: 14.9 % (ref 11.5–15.5)
WBC: 5.3 10*3/uL (ref 4.0–10.5)

## 2015-06-21 LAB — HEMOGLOBIN A1C: Hgb A1c MFr Bld: 6.4 % (ref 4.6–6.5)

## 2015-06-21 LAB — LIPID PANEL
Cholesterol: 225 mg/dL — ABNORMAL HIGH (ref 0–200)
HDL: 50.5 mg/dL (ref >39.00)
LDL Cholesterol: 148 mg/dL — ABNORMAL HIGH (ref 0–99)
NonHDL: 174.07
Total CHOL/HDL Ratio: 4
Triglycerides: 129 mg/dL (ref 0.0–149.0)
VLDL: 25.8 mg/dL (ref 0.0–40.0)

## 2015-06-21 LAB — TSH: TSH: 0.58 u[IU]/mL (ref 0.35–4.50)

## 2015-06-21 LAB — VITAMIN D 25 HYDROXY (VIT D DEFICIENCY, FRACTURES): VITD: 33.49 ng/mL (ref 30.00–100.00)

## 2015-06-21 MED ORDER — GABAPENTIN 400 MG PO CAPS: 800.0000 mg | ORAL_CAPSULE | Freq: Every day | ORAL | Status: DC

## 2015-06-21 MED ORDER — FLUOXETINE HCL 10 MG PO CAPS: 20.0000 mg | ORAL_CAPSULE | Freq: Every day | ORAL | Status: DC

## 2015-06-21 MED ORDER — VENLAFAXINE HCL ER 75 MG PO CP24: 225.0000 mg | ORAL_CAPSULE | Freq: Every day | ORAL | Status: DC

## 2015-06-21 MED ORDER — GABAPENTIN 100 MG PO CAPS: 100.0000 mg | ORAL_CAPSULE | Freq: Two times a day (BID) | ORAL | Status: DC

## 2015-06-21 MED FILL — GABAPENTIN 100 MG CAPSULE: 100 | 30 days supply | Qty: 60 | Fill #0

## 2015-06-21 MED FILL — VENLAFAXINE HCL ER 75 MG CA: 75 | 30 days supply | Qty: 90 | Fill #0

## 2015-06-21 NOTE — Progress Notes (Signed)
Pre visit review using our clinic review tool, if applicable. No additional management support is needed unless otherwise documented below in the visit note. 

## 2015-06-21 NOTE — Patient Instructions (Signed)
Myofascial Pain Syndrome and Fibromyalgia  Myofascial pain syndrome and fibromyalgia are both pain disorders. This pain may be felt mainly in your muscles.   · Myofascial pain syndrome:    Always has trigger points or tender points in the muscle that will cause pain when pressed. The pain may come and go.    Usually affects your neck, upper back, and shoulder areas. The pain often radiates into your arms and hands.  · Fibromyalgia:    Has muscle pains and tenderness that come and go.    Is often associated with fatigue and sleep disturbances.    Has trigger points.    Tends to be long-lasting (chronic), but is not life-threatening.  Fibromyalgia and myofascial pain are not the same. However, they often occur together. If you have both conditions, each can make the other worse. Both are common and can cause enough pain and fatigue to make day-to-day activities difficult.   CAUSES   The exact causes of fibromyalgia and myofascial pain are not known. People with certain gene types may be more likely to develop fibromyalgia. Some factors can be triggers for both conditions, such as:   · Spine disorders.  · Arthritis.  · Severe injury (trauma) and other physical stressors.  · Being under a lot of stress.  · A medical illness.  SIGNS AND SYMPTOMS   Fibromyalgia  The main symptom of fibromyalgia is widespread pain and tenderness in your muscles. This can vary over time. Pain is sometimes described as stabbing, shooting, or burning. You may have tingling or numbness, too. You may also have sleep problems and fatigue. You may wake up feeling tired and groggy (fibro fog). Other symptoms may include:   · Bowel and bladder problems.  · Headaches.  · Visual problems.  · Problems with odors and noises.  · Depression or mood changes.  · Painful menstrual periods (dysmenorrhea).  · Dry skin or eyes.  Myofascial pain syndrome  Symptoms of myofascial pain syndrome include:   · Tight, ropy bands of muscle.    · Uncomfortable  sensations in muscular areas, such as:    Aching.    Cramping.    Burning.    Numbness.    Tingling.      Muscle weakness.  · Trouble moving certain muscles freely (range of motion).  DIAGNOSIS   There are no specific tests to diagnose fibromyalgia or myofascial pain syndrome. Both can be hard to diagnose because their symptoms are common in many other conditions. Your health care provider may suspect one or both of these conditions based on your symptoms and medical history. Your health care provider will also do a physical exam.   The key to diagnosing fibromyalgia is having pain, fatigue, and other symptoms for more than three months that cannot be explained by another condition.   The key to diagnosing myofascial pain syndrome is finding trigger points in muscles that are tender and cause pain elsewhere in your body (referred pain).  TREATMENT   Treating fibromyalgia and myofascial pain often requires a team of health care providers. This usually starts with your primary provider and a physical therapist. You may also find it helpful to work with alternative health care providers, such as massage therapists or acupuncturists.  Treatment for fibromyalgia may include medicines. This may include nonsteroidal anti-inflammatory drugs (NSAIDs), along with other medicines.   Treatment for myofascial pain may also include:  · NSAIDs.  · Cooling and stretching of muscles.  · Trigger point injections.  ·   Sound wave (ultrasound) treatments to stimulate muscles.  HOME CARE INSTRUCTIONS   · Take medicines only as directed by your health care provider.  · Exercise as directed by your health care provider or physical therapist.  · Try to avoid stressful situations.  · Practice relaxation techniques to control your stress. You may want to try:    Biofeedback.    Visual imagery.    Hypnosis.    Muscle relaxation.    Yoga.    Meditation.  · Talk to your health care provider about alternative treatments, such as acupuncture or  massage treatment.  · Maintain a healthy lifestyle. This includes eating a healthy diet and getting enough sleep.  · Consider joining a support group.  · Do not do activities that stress or strain your muscles. That includes repetitive motions and heavy lifting.  SEEK MEDICAL CARE IF:   · You have new symptoms.  · Your symptoms get worse.  · You have side effects from your medicines.  · You have trouble sleeping.  · Your condition is causing depression or anxiety.  FOR MORE INFORMATION   · National Fibromyalgia Association: http://www.fmaware.orgwww.fmaware.org  · Arthritis Foundation: http://www.arthritis.orgwww.arthritis.org  · American Chronic Pain Association: http://www.theacpa.org/condition/myofascial-painwww.theacpa.org/condition/myofascial-pain     This information is not intended to replace advice given to you by your health care provider. Make sure you discuss any questions you have with your health care provider.     Document Released: 04/10/2005 Document Revised: 05/01/2014 Document Reviewed: 01/14/2014  Elsevier Interactive Patient Education ©2016 [employer]

## 2015-06-22 ENCOUNTER — Encounter: Payer: Self-pay | Admitting: Family Medicine

## 2015-06-22 ENCOUNTER — Ambulatory Visit (HOSPITAL_COMMUNITY)
Admission: RE | Admit: 2015-06-22 | Discharge: 2015-06-22 | Disposition: A | Discharge status: Home / Self Care | Payer: 59 | Source: Ambulatory Visit | Attending: Family Medicine | Admitting: Family Medicine

## 2015-06-22 DIAGNOSIS — M7552 Bursitis of left shoulder: Secondary | ICD-10-CM | POA: Insufficient documentation

## 2015-06-22 DIAGNOSIS — M75102 Unspecified rotator cuff tear or rupture of left shoulder, not specified as traumatic: Secondary | ICD-10-CM | POA: Insufficient documentation

## 2015-06-22 DIAGNOSIS — M25512 Pain in left shoulder: Secondary | ICD-10-CM | POA: Insufficient documentation

## 2015-06-22 DIAGNOSIS — M19012 Primary osteoarthritis, left shoulder: Secondary | ICD-10-CM | POA: Diagnosis not present

## 2015-06-22 MED ORDER — SIMVASTATIN 10 MG PO TABS: 10.0000 mg | ORAL_TABLET | ORAL | Status: DC

## 2015-06-22 MED FILL — SIMVASTATIN 10 MG TABLET: 10 | 30 days supply | Qty: 15 | Fill #0

## 2015-06-27 ENCOUNTER — Encounter: Payer: Self-pay | Admitting: Family Medicine

## 2015-06-27 DIAGNOSIS — M25519 Pain in unspecified shoulder: Secondary | ICD-10-CM

## 2015-06-27 DIAGNOSIS — M25512 Pain in left shoulder: Secondary | ICD-10-CM | POA: Insufficient documentation

## 2015-06-27 HISTORY — DX: Pain in unspecified shoulder: M25.519

## 2015-06-27 NOTE — Assessment & Plan Note (Signed)
Well controlled, no changes to meds. Encouraged heart healthy diet such as the DASH diet and exercise as tolerated.  °

## 2015-06-27 NOTE — Assessment & Plan Note (Signed)
hgba1c acceptable, minimize simple carbs. Increase exercise as tolerated. Continue current meds 

## 2015-06-27 NOTE — Assessment & Plan Note (Signed)
On Levothyroxine, continue to monitor 

## 2015-06-27 NOTE — Progress Notes (Signed)
Patient ID: [name], female   DOB: 1960/12/27, 55 y.o.   MRN: [id number]   Subjective:    Patient ID: [name], female    DOB: [birth year], 55 y.o.   MRN: [id number]  Chief Complaint  Patient presents with  . Follow-up    pt. here for 3 month follow-up; she reported continued issues with vocal cords; rotator cuff; would like to know the results of her last MRI    HPI Patient is in today for follow up. She is depressed about her current state of health and struggling with anhedonia and difficulty concentrating. No suicidal ideation. Continues to struggle with daily pain, most notable pain is in her left shoulder at this time, she has had some steroid injections in this shoulder in past with some relief but for last couple of weeks it is worse with difficulty finding a comfortable position. Worst at night and radiates from shoulder to neck and jaw. Denies any nausea, vomiting, diaphoresis, sob or  Palpitations are associated.   Past Medical History  Diagnosis Date  . GERD (gastroesophageal reflux disease)   . Hypertension   . Thyroid disease     hypothyroidism  . Anxiety   . Depression   . Hyperlipidemia   . Chronic rhinosinusitis   . OSA (obstructive sleep apnea)   . Obesity   . Colon polyps   . Fibroid, uterine   . Hoarseness   . Glaucoma 12-13  . IBS (irritable bowel syndrome) 02/13/2011  . Perimenopause 10/05/2012  . Edema 01/06/2013  . Diabetes ([place]) 01/06/2013  . Chronic pain syndrome 01/06/2013  . Cough 01/06/2013  . Hypokalemia 02/05/2013  . Low back pain 03/03/2013  . Hyperglycemia 04/13/2013  . Hoarseness of voice 05/11/2013  . Obesity, unspecified 05/11/2013  . Raynaud disease 06/16/2013  . Hyperlipemia, mixed 06/06/2007    Qualifier: Diagnosis of  By: Wynona Luna She feels Lipitor caused increased low back pain and weakness    . Muscle cramp 05/22/2014  . Diabetes mellitus type 2 in obese [facility]) 01/06/2013    Sees Dr Calvert Cantor for eye exam Does not see  podiatry, foot exam today unremarkable except for thick cracking skin on heals   . Pain in joint, shoulder region 06/27/2015    Past Surgical History  Procedure Laterality Date  . Cholecystectomy    . Abdominal hysterectomy      Family History  Problem Relation Age of Onset  . Alcohol abuse Father   . Cancer Father     renal and colon  . Hyperlipidemia Father   . Hypertension Father   . Diabetes    . Cancer Paternal Grandfather     colon    Social History   Social History  . Marital Status: Single    Spouse Name: N/A  . Number of Children: N/A  . Years of Education: N/A   Occupational History  . Not on file.   Social History Main Topics  . Smoking status: Former Smoker -- 0.50 packs/day for 30 years    Types: Cigarettes    Quit date: 04/25/2003  . Smokeless tobacco: Never Used     Comment: smoked since age 89  . Alcohol Use: No  . Drug Use: Not on file  . Sexual Activity: Not on file   Other Topics Concern  . Not on file   Social History Narrative   Endo-- Dr Dwyane Dee   ENT--Dr shoemaker   GI--Dr [place]   Pulm--Dr Gwenette Greet  Rheum--Dr Charlestine Night          Outpatient Prescriptions Prior to Visit  Medication Sig Dispense Refill  . albuterol (PROVENTIL HFA;VENTOLIN HFA) 108 (90 BASE) MCG/ACT inhaler Inhale 2 puffs into the lungs every 4 (four) hours as needed for wheezing or shortness of breath. 1 Inhaler 6  . ALPRAZolam (XANAX) 0.25 MG tablet TAKE 1/2 TABLET BY MOUTH 2 TIMES A DAY FOR 14 DAYS, THEN 1/2 TAB DAILY FOR 7 DAYS, THE 1/2 - 1 TAB DAILY AS NEEDED 40 tablet 1  . BYSTOLIC 20 MG TABS TAKE 1 TABLET BY MOUTH ONCE DAILY 30 tablet 6  . clotrimazole (LOTRIMIN) 1 % cream Apply topically 2 (two) times daily. 60 g 1  . fenofibrate micronized (ANTARA) 130 MG capsule TAKE 1 CAPSULE BY MOUTH DAILY BEFORE BREAKFAST 30 capsule 3  . furosemide (LASIX) 20 MG tablet TAKE 1-2 TABLETS BY MOUTH DAILY AS NEEDED FOR FLUID OR EDEMA 40 tablet PRN  . glucose blood (TRUE CARE TEST  STRIP PACK) test strip Use as instructed 100 each 3  . hyoscyamine (LEVSIN, ANASPAZ) 0.125 MG tablet Take 1 tablet (0.125 mg total) by mouth every 4 (four) hours as needed for cramping. 40 tablet 1  . ipratropium (ATROVENT) 0.06 % nasal spray Place 2 sprays into both nostrils 4 (four) times daily. 15 mL 12  . latanoprost (XALATAN) 0.005 % ophthalmic solution Place 1 drop into both eyes at bedtime.    . metFORMIN (GLUCOPHAGE-XR) 500 MG 24 hr tablet TAKE 2 TABLETS BY MOUTH DAILY WITH BREAKFAST. 60 tablet 11  . Spacer/Aero-Holding Dorise Bullion Use as directed 1 each 1  . spironolactone (ALDACTONE) 25 MG tablet Take 1 tablet (25 mg total) by mouth 2 (two) times daily. 60 tablet 1  . SYNTHROID 100 MCG tablet TAKE 1 TABLET BY MOUTH DAILY BEFORE BREAKFAST 60 tablet 2  . TRUEPLUS LANCETS 30G MISC 1 Device by Does not apply route daily as needed. DX 250.00 by Does not apply route daily as needed. DX 250.00 100 each 2  . TRUEPLUS LANCETS 30G MISC Use.    . Vitamin D, Ergocalciferol, (DRISDOL) 50000 UNITS CAPS capsule Take 1 capsule (50,000 Units total) by mouth every 7 (seven) days. 12 capsule 1  . FLUoxetine (PROZAC) 10 MG capsule TAKE 3 CAPSULES BY MOUTH DAILY. 90 capsule 0  . gabapentin (NEURONTIN) 400 MG capsule Take 1 capsule (400 mg total) by mouth 2 (two) times daily. 60 capsule 2  . venlafaxine XR (EFFEXOR XR) 150 MG 24 hr capsule Take 1 capsule (150 mg total) by mouth daily with breakfast. 30 capsule 3  . venlafaxine XR (EFFEXOR-XR) 150 MG 24 hr capsule TAKE 1 CAPSULE BY MOUTH DAILY. 30 capsule 2   No facility-administered medications prior to visit.    Allergies  Allergen Reactions  . Losartan Cough  . Aspirin     REACTION: Upset stomach  . Atorvastatin     myalgias  . Codeine   . Amoxicillin Rash    Review of Systems  Constitutional: Positive for malaise/fatigue. Negative for fever.  HENT: Positive for sore throat. Negative for congestion.   Eyes: Negative for discharge.    Respiratory: Negative for shortness of breath.   Cardiovascular: Negative for chest pain, palpitations and leg swelling.  Gastrointestinal: Negative for nausea and abdominal pain.  Genitourinary: Positive for frequency. Negative for dysuria.  Musculoskeletal: Positive for myalgias, back pain, joint pain and neck pain. Negative for falls.  Skin: Negative for rash.  Neurological: Negative for loss of  consciousness and headaches.  Endo/Heme/Allergies: Negative for environmental allergies.  Psychiatric/Behavioral: Negative for depression. The patient is not nervous/anxious.        Objective:    Physical Exam  Constitutional: She is oriented to person, place, and time. She appears well-developed and well-nourished. No distress.  HENT:  Head: Normocephalic and atraumatic.  Nose: Nose normal.  Eyes: Right eye exhibits no discharge. Left eye exhibits no discharge.  Neck: Normal range of motion. Neck supple.  Cardiovascular: Normal rate and regular rhythm.   No murmur heard. Pulmonary/Chest: Effort normal and breath sounds normal.  Abdominal: Soft. Bowel sounds are normal. There is no tenderness.  Musculoskeletal: She exhibits no edema.  Neurological: She is alert and oriented to person, place, and time.  Skin: Skin is warm and dry.  Psychiatric: She has a normal mood and affect.  Nursing note and vitals reviewed.   BP 131/76 mmHg  Pulse 66  Temp(Src) 98.8 F (37.1 C) (Oral)  Resp 16  Ht 5\' 5"  (1.651 m)  Wt 251 lb 12.8 oz (114.216 kg)  BMI 41.90 kg/m2  SpO2 97% Wt Readings from Last 3 Encounters:  06/21/15 251 lb 12.8 oz (114.216 kg)  03/22/15 247 lb 2 oz (112.095 kg)  12/21/14 244 lb 6 oz (110.848 kg)     Lab Results  Component Value Date   WBC 5.3 06/21/2015   HGB 13.4 06/21/2015   HCT 39.8 06/21/2015   PLT 339.0 06/21/2015   GLUCOSE 78 06/21/2015   CHOL 225* 06/21/2015   TRIG 129.0 06/21/2015   HDL 50.50 06/21/2015   LDLDIRECT 160.8 01/30/2014   LDLCALC 148*  06/21/2015   ALT 28 06/21/2015   AST 29 06/21/2015   NA 139 06/21/2015   K 4.0 06/21/2015   CL 107 06/21/2015   CREATININE 0.93 06/21/2015   BUN 9 06/21/2015   CO2 24 06/21/2015   TSH 0.58 06/21/2015   HGBA1C 6.4 06/21/2015   MICROALBUR <0.7 09/04/2014    Lab Results  Component Value Date   TSH 0.58 06/21/2015   Lab Results  Component Value Date   WBC 5.3 06/21/2015   HGB 13.4 06/21/2015   HCT 39.8 06/21/2015   MCV 88.2 06/21/2015   PLT 339.0 06/21/2015   Lab Results  Component Value Date   NA 139 06/21/2015   K 4.0 06/21/2015   CO2 24 06/21/2015   GLUCOSE 78 06/21/2015   BUN 9 06/21/2015   CREATININE 0.93 06/21/2015   BILITOT 0.3 06/21/2015   ALKPHOS 49 06/21/2015   AST 29 06/21/2015   ALT 28 06/21/2015   PROT 7.4 06/21/2015   ALBUMIN 4.5 06/21/2015   CALCIUM 9.6 06/21/2015   GFR 80.63 06/21/2015   Lab Results  Component Value Date   CHOL 225* 06/21/2015   Lab Results  Component Value Date   HDL 50.50 06/21/2015   Lab Results  Component Value Date   LDLCALC 148* 06/21/2015   Lab Results  Component Value Date   TRIG 129.0 06/21/2015   Lab Results  Component Value Date   CHOLHDL 4 06/21/2015   Lab Results  Component Value Date   HGBA1C 6.4 06/21/2015       Assessment & Plan:   Problem List Items Addressed This Visit    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 Increase exercise as tolerated. Continue current meds      Relevant Medications   simvastatin (ZOCOR) 10 MG tablet   Other Relevant Orders   TSH (Completed)   CBC (Completed)   Lipid panel (Completed)   Comprehensive metabolic panel (Completed)   VITAMIN D 25 Hydroxy (Vit-D Deficiency, Fractures) (Completed)   Hemoglobin A1c (Completed)   Essential hypertension    Well controlled, no  changes to meds. Encouraged heart healthy diet such as the DASH diet and exercise as tolerated.       Relevant Medications   simvastatin (ZOCOR) 10 MG tablet   Other Relevant Orders   EKG 12-Lead   TSH (Completed)   CBC (Completed)   Lipid panel (Completed)   Comprehensive metabolic panel (Completed)   VITAMIN D 25 Hydroxy (Vit-D Deficiency, Fractures) (Completed)   Hemoglobin A1c (Completed)   EKG 12-Lead (Completed)   GERD    Avoid offending foods, start probiotics. Do not eat large meals in late evening and consider raising head of bed.       Relevant Orders   TSH (Completed)   CBC (Completed)   Lipid panel (Completed)   Comprehensive metabolic panel (Completed)   VITAMIN D 25 Hydroxy (Vit-D Deficiency, Fractures) (Completed)   Hemoglobin A1c (Completed)   Hyperlipemia, mixed   Relevant Medications   simvastatin (ZOCOR) 10 MG tablet   Other Relevant Orders   TSH (Completed)   CBC (Completed)   Lipid panel (Completed)   Comprehensive metabolic panel (Completed)   VITAMIN D 25 Hydroxy (Vit-D Deficiency, Fractures) (Completed)   Hemoglobin A1c (Completed)   Hypothyroidism    On Levothyroxine, continue to monitor      Relevant Orders   TSH (Completed)   CBC (Completed)   Lipid panel (Completed)   Comprehensive metabolic panel (Completed)   VITAMIN D 25 Hydroxy (Vit-D Deficiency, Fractures) (Completed)   Hemoglobin A1c (Completed)   Obesity    Encouraged DASH diet, decrease po intake and increase exercise as tolerated. Needs 7-8 hours of sleep nightly. Avoid trans fats, eat small, frequent meals every 4-5 hours with lean proteins, complex carbs and healthy fats. Minimize simple carbs      Pain in joint, shoulder region    Referred to orthopaedics for further care due to persistent pain.       Vitamin D deficiency    Tolerates prescription vitamin D but not the otc versions, will continue the prescription.       Other Visit Diagnoses    Left shoulder pain    -   Primary    Relevant Orders    MR Shoulder Left Wo Contrast (Completed)    EKG 12-Lead    TSH (Completed)    CBC (Completed)    Lipid panel (Completed)    Comprehensive metabolic panel (Completed)    VITAMIN D 25 Hydroxy (Vit-D Deficiency, Fractures) (Completed)    Hemoglobin A1c (Completed)    EKG 12-Lead (Completed)    Rotator cuff tear, left        Relevant Orders    Ambulatory referral to Orthopedic Surgery    Bursitis        Relevant Orders    Ambulatory referral to Orthopedic Surgery       I have discontinued Ms. Olmo venlafaxine XR, FLUoxetine, and venlafaxine XR. I have also changed her gabapentin. Additionally, I am having her start on FLUoxetine, venlafaxine XR, gabapentin, and simvastatin. Lastly, I am having her maintain her clotrimazole, latanoprost, TRUEPLUS LANCETS 30G, glucose blood, hyoscyamine, metFORMIN,  spironolactone, TRUEPLUS LANCETS 30G, Spacer/Aero-Holding Chambers, ipratropium, albuterol, ALPRAZolam, BYSTOLIC, fenofibrate micronized, furosemide, Vitamin D (Ergocalciferol), and SYNTHROID.  Meds ordered this encounter  Medications  . FLUoxetine (PROZAC) 10 MG capsule    Sig: Take 2 capsules (20 mg total) by mouth daily.    Dispense:  60 capsule    Refill:  3  . venlafaxine XR (EFFEXOR XR) 75 MG 24 hr capsule    Sig: Take 3 capsules (225 mg total) by mouth daily with breakfast. Patient needs capsules, does not tolerate the capsules    Dispense:  90 capsule    Refill:  3  . gabapentin (NEURONTIN) 400 MG capsule    Sig: Take 2 capsules (800 mg total) by mouth at bedtime.    Dispense:  60 capsule    Refill:  3  . gabapentin (NEURONTIN) 100 MG capsule    Sig: Take 1 capsule (100 mg total) by mouth 2 (two) times daily. During the day    Dispense:  60 capsule    Refill:  3  . simvastatin (ZOCOR) 10 MG tablet    Sig: Take 1 tablet (10 mg total) by mouth every other day.    Dispense:  15 tablet    Refill:  2     Penni Homans, MD

## 2015-06-27 NOTE — Assessment & Plan Note (Signed)
Encouraged DASH diet, decrease po intake and increase exercise as tolerated. Needs 7-8 hours of sleep nightly. Avoid trans fats, eat small, frequent meals every 4-5 hours with lean proteins, complex carbs and healthy fats. Minimize simple carbs 

## 2015-06-27 NOTE — Assessment & Plan Note (Signed)
Tolerates prescription vitamin D but not the otc versions, will continue the prescription.

## 2015-06-27 NOTE — Assessment & Plan Note (Signed)
Struggling with worsening depression will increase Venlafaxine to 225 mg daily

## 2015-06-27 NOTE — Assessment & Plan Note (Signed)
Referred to orthopaedics for further care due to persistent pain.

## 2015-06-27 NOTE — Assessment & Plan Note (Signed)
Avoid offending foods, start probiotics. Do not eat large meals in late evening and consider raising head of bed.  

## 2015-06-28 DIAGNOSIS — Z6841 Body Mass Index (BMI) 40.0 and over, adult: Secondary | ICD-10-CM | POA: Diagnosis not present

## 2015-06-28 DIAGNOSIS — R5383 Other fatigue: Secondary | ICD-10-CM | POA: Diagnosis not present

## 2015-06-28 DIAGNOSIS — Z1231 Encounter for screening mammogram for malignant neoplasm of breast: Secondary | ICD-10-CM | POA: Diagnosis not present

## 2015-06-28 DIAGNOSIS — Z01419 Encounter for gynecological examination (general) (routine) without abnormal findings: Secondary | ICD-10-CM | POA: Diagnosis not present

## 2015-06-28 DIAGNOSIS — M791 Myalgia: Secondary | ICD-10-CM | POA: Diagnosis not present

## 2015-06-28 DIAGNOSIS — Z13820 Encounter for screening for osteoporosis: Secondary | ICD-10-CM | POA: Diagnosis not present

## 2015-06-28 DIAGNOSIS — R748 Abnormal levels of other serum enzymes: Secondary | ICD-10-CM | POA: Diagnosis not present

## 2015-06-29 DIAGNOSIS — M7542 Impingement syndrome of left shoulder: Secondary | ICD-10-CM | POA: Diagnosis not present

## 2015-06-29 LAB — HM PAP SMEAR

## 2015-06-30 ENCOUNTER — Other Ambulatory Visit: Payer: Self-pay | Admitting: Obstetrics and Gynecology

## 2015-06-30 DIAGNOSIS — R928 Other abnormal and inconclusive findings on diagnostic imaging of breast: Secondary | ICD-10-CM

## 2015-07-05 ENCOUNTER — Other Ambulatory Visit: Payer: Self-pay | Admitting: Family Medicine

## 2015-07-05 MED ORDER — NEBIVOLOL HCL 20 MG PO TABS: 1.0000 | ORAL_TABLET | Freq: Every day | ORAL | Status: DC

## 2015-07-05 MED FILL — SPIRONOLACTONE 25 MG TABLET: 25 | 90 days supply | Qty: 180 | Fill #0

## 2015-07-05 MED FILL — FENOFIBRATE 130 MG CAPSULE: 130 | 30 days supply | Qty: 30 | Fill #3

## 2015-07-05 MED FILL — FUROSEMIDE 20 MG TABLET: 20 | 20 days supply | Qty: 40 | Fill #0

## 2015-07-05 MED FILL — BYSTOLIC 20 MG TABLET: 20 | 90 days supply | Qty: 90 | Fill #0

## 2015-07-05 MED FILL — GABAPENTIN 400 MG CAPSULE: 400 | 30 days supply | Qty: 60 | Fill #2

## 2015-07-06 ENCOUNTER — Ambulatory Visit
Admission: RE | Admit: 2015-07-06 | Discharge: 2015-07-06 | Disposition: A | Discharge status: Home / Self Care | Payer: 59 | Source: Ambulatory Visit | Attending: Obstetrics and Gynecology | Admitting: Obstetrics and Gynecology

## 2015-07-06 ENCOUNTER — Other Ambulatory Visit: Payer: Self-pay | Admitting: Family Medicine

## 2015-07-06 ENCOUNTER — Encounter: Payer: Self-pay | Admitting: Family Medicine

## 2015-07-06 DIAGNOSIS — R928 Other abnormal and inconclusive findings on diagnostic imaging of breast: Secondary | ICD-10-CM

## 2015-07-06 DIAGNOSIS — N6489 Other specified disorders of breast: Secondary | ICD-10-CM | POA: Diagnosis not present

## 2015-07-06 MED ORDER — ROSUVASTATIN CALCIUM 5 MG PO TABS: 5.0000 mg | ORAL_TABLET | ORAL | Status: DC

## 2015-07-20 DIAGNOSIS — M7542 Impingement syndrome of left shoulder: Secondary | ICD-10-CM | POA: Diagnosis not present

## 2015-07-23 ENCOUNTER — Ambulatory Visit (INDEPENDENT_AMBULATORY_CARE_PROVIDER_SITE_OTHER): Payer: 59 | Admitting: Cardiovascular Disease

## 2015-07-23 ENCOUNTER — Encounter: Payer: Self-pay | Admitting: Cardiovascular Disease

## 2015-07-23 VITALS — BP 146/92 | HR 68 | Ht 65.0 in | Wt 253.8 lb

## 2015-07-23 DIAGNOSIS — R0789 Other chest pain: Secondary | ICD-10-CM

## 2015-07-23 DIAGNOSIS — R9431 Abnormal electrocardiogram [ECG] [EKG]: Secondary | ICD-10-CM | POA: Diagnosis not present

## 2015-07-23 DIAGNOSIS — I10 Essential (primary) hypertension: Secondary | ICD-10-CM

## 2015-07-23 DIAGNOSIS — G894 Chronic pain syndrome: Secondary | ICD-10-CM

## 2015-07-23 DIAGNOSIS — E785 Hyperlipidemia, unspecified: Secondary | ICD-10-CM

## 2015-07-23 NOTE — Progress Notes (Signed)
Cardiology Office Note   Date:  07/23/2015   ID:  [name], DOB [birth year], MRN [id number]  PCP:  Penni Homans, MD  Cardiologist:   Thayer Headings, MD   Chief Complaint  Patient presents with  . Hyperlipidemia    BEEN HAVING TROUBLE WITH HER ROTATER CUFF. SOME SWELLING BUT HAS GOTTEN BETTER AND NO CHEST APIN  . New Patient (Initial Visit)   Problem List 1. Essential HTN 2. Hypothyroidism 3. Hyperlipidemia  4. Diabetes Mellitus 5. Left shoulder pain  6. Obstructive sleep apnea    History of Present Illness: [name] is a 55 y.o. female who presents for evaluation of some left shoulder / jaw pain . ECG showed some mild irreg. ( NS T abn.) Has been evaluated for collegen vascular disease Has elevated CPK - sees a rheumatologist  Saw her orthopedic doctor - was told her that she had a tear in her shoulder / rotator cuff Also has some bursitis  I saw her around 2008.    Echo and stress were normal to the best of her memory . Cannot tolerate statins due to muscle aches.   Takes lots of steroids of vocal cord issues  Gets steroid injections  Works at [employer].  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  Lots of stress - her mother passed away last year   Past Medical History  Diagnosis Date  . GERD (gastroesophageal reflux disease)   . Hypertension   . Thyroid disease     hypothyroidism  . Anxiety   . Depression   . Hyperlipidemia   . Chronic rhinosinusitis   . OSA (obstructive sleep apnea)   . Obesity   . Colon polyps   . Fibroid, uterine   . Hoarseness   . Glaucoma 12-13  . IBS (irritable bowel syndrome) 02/13/2011  . Perimenopause 10/05/2012  . Edema 01/06/2013  . Diabetes ([place]) 01/06/2013  . Chronic pain syndrome 01/06/2013  . Cough 01/06/2013  . Hypokalemia 02/05/2013  . Low back pain 03/03/2013  . Hyperglycemia 04/13/2013  . Hoarseness of voice 05/11/2013  . Obesity, unspecified 05/11/2013  . Raynaud disease  06/16/2013  . Hyperlipemia, mixed 06/06/2007    Qualifier: Diagnosis of  By: Wynona Luna She feels Lipitor caused increased low back pain and weakness    . Muscle cramp 05/22/2014  . Diabetes mellitus type 2 in obese [facility]) 01/06/2013    Sees Dr Calvert Cantor for eye exam Does not see podiatry, foot exam today unremarkable except for thick cracking skin on heals   . Pain in joint, shoulder region 06/27/2015    Past Surgical History  Procedure Laterality Date  . Cholecystectomy    . Abdominal hysterectomy       Current Outpatient Prescriptions  Medication Sig Dispense Refill  . albuterol (PROVENTIL HFA;VENTOLIN HFA) 108 (90 BASE) MCG/ACT inhaler Inhale 2 puffs into the lungs every 4 (four) hours as needed for wheezing or shortness of breath. 1 Inhaler 6  . ALPRAZolam (XANAX) 0.25 MG tablet TAKE 1/2 TABLET BY MOUTH 2 TIMES A DAY FOR 14 DAYS, THEN 1/2 TAB DAILY FOR 7 DAYS, THE 1/2 - 1 TAB DAILY AS NEEDED 40 tablet 1  . clotrimazole (LOTRIMIN) 1 % cream Apply topically 2 (two) times daily. 60 g 1  . fenofibrate micronized (ANTARA) 130 MG capsule TAKE 1 CAPSULE BY MOUTH DAILY BEFORE BREAKFAST 30 capsule 3  . FLUoxetine (PROZAC) 10 MG capsule Take 2 capsules (20 mg  total) by mouth daily. 60 capsule 3  . furosemide (LASIX) 20 MG tablet TAKE 1-2 TABLETS BY MOUTH DAILY AS NEEDED FOR FLUID OR EDEMA 40 tablet 6  . gabapentin (NEURONTIN) 100 MG capsule Take 1 capsule (100 mg total) by mouth 2 (two) times daily. During the day 60 capsule 3  . gabapentin (NEURONTIN) 400 MG capsule Take 2 capsules (800 mg total) by mouth at bedtime. 60 capsule 3  . glucose blood (TRUE CARE TEST STRIP PACK) test strip Use as instructed 100 each 3  . hyoscyamine (LEVSIN, ANASPAZ) 0.125 MG tablet Take 1 tablet (0.125 mg total) by mouth every 4 (four) hours as needed for cramping. 40 tablet 1  . ipratropium (ATROVENT) 0.06 % nasal spray Place 2 sprays into both nostrils 4 (four) times daily. 15 mL 12  . latanoprost (XALATAN)  0.005 % ophthalmic solution Place 1 drop into both eyes at bedtime.    . metFORMIN (GLUCOPHAGE-XR) 500 MG 24 hr tablet TAKE 2 TABLETS BY MOUTH DAILY WITH BREAKFAST. 60 tablet 11  . Nebivolol HCl (BYSTOLIC) 20 MG TABS Take 1 tablet (20 mg total) by mouth daily. 90 tablet 2  . Spacer/Aero-Holding Dorise Bullion Use as directed 1 each 1  . spironolactone (ALDACTONE) 25 MG tablet Take 1 tablet (25 mg total) by mouth 2 (two) times daily. 60 tablet 1  . SYNTHROID 100 MCG tablet TAKE 1 TABLET BY MOUTH DAILY BEFORE BREAKFAST 60 tablet 2  . TRUEPLUS LANCETS 30G MISC 1 Device by Does not apply route daily as needed. DX 250.00 by Does not apply route daily as needed. DX 250.00 100 each 2  . venlafaxine XR (EFFEXOR XR) 75 MG 24 hr capsule Take 3 capsules (225 mg total) by mouth daily with breakfast. Patient needs capsules, does not tolerate the capsules 90 capsule 3  . Vitamin D, Ergocalciferol, (DRISDOL) 50000 UNITS CAPS capsule Take 1 capsule (50,000 Units total) by mouth every 7 (seven) days. 12 capsule 1   No current facility-administered medications for this visit.    Allergies:   Losartan; Aspirin; Atorvastatin; Codeine; and Amoxicillin    Social History:  The patient  reports that she quit smoking about 12 years ago. Her smoking use included Cigarettes. She has a 15 pack-year smoking history. She has never used smokeless tobacco. She reports that she does not drink alcohol.   Family History:  The patient's family history includes Alcohol abuse in her father; Cancer in her father and paternal grandfather; Hyperlipidemia in her father; Hypertension in her father.    ROS:  Please see the history of present illness.    Review of Systems: Constitutional:  denies fever, chills, diaphoresis, appetite change and fatigue.  HEENT: denies photophobia, eye pain, redness, hearing loss, ear pain, congestion, sore throat, rhinorrhea, sneezing, neck pain, neck stiffness and tinnitus.  Respiratory: denies SOB,  DOE, cough, chest tightness, and wheezing.  Cardiovascular: admits to chest pain,  .  Gastrointestinal: denies nausea, vomiting, abdominal pain, diarrhea, constipation, blood in stool.  Genitourinary: denies dysuria, urgency, frequency, hematuria, flank pain and difficulty urinating.  Musculoskeletal: denies  myalgias, back pain, joint swelling, arthralgias and gait problem.   Skin: denies pallor, rash and wound.  Neurological: denies dizziness, seizures, syncope, weakness, light-headedness, numbness and headaches.   Hematological: denies adenopathy, easy bruising, personal or family bleeding history.  Psychiatric/ Behavioral: denies suicidal ideation, mood changes, confusion, nervousness, sleep disturbance and agitation.       All other systems are reviewed and negative.    PHYSICAL  EXAM: VS:  BP 146/92 mmHg  Pulse 68  Ht 5\' 5"  (1.651 m)  Wt 253 lb 12.8 oz (115.123 kg)  BMI 42.23 kg/m2 , BMI Body mass index is 42.23 kg/(m^2). GEN: Well nourished, well developed, in no acute distress HEENT: normal Neck: no JVD, carotid bruits, or masses Cardiac: RRR; no murmurs, rubs, or gallops,no edema  Respiratory:  clear to auscultation bilaterally, normal work of breathing GI: soft, nontender, nondistended, + BS MS: no deformity or atrophy Skin: warm and dry, no rash Neuro:  Strength and sensation are intact Psych: normal   EKG:  EKG is not ordered today.    Recent Labs: 06/21/2015: ALT 28; BUN 9; Creatinine, Ser 0.93; Hemoglobin 13.4; Platelets 339.0; Potassium 4.0; Sodium 139; TSH 0.58    Lipid Panel    Component Value Date/Time   CHOL 225* 06/21/2015 1014   TRIG 129.0 06/21/2015 1014   HDL 50.50 06/21/2015 1014   CHOLHDL 4 06/21/2015 1014   VLDL 25.8 06/21/2015 1014   LDLCALC 148* 06/21/2015 1014   LDLDIRECT 160.8 01/30/2014 1112      Wt Readings from Last 3 Encounters:  07/23/15 253 lb 12.8 oz (115.123 kg)  06/21/15 251 lb 12.8 oz (114.216 kg)  03/22/15 247 lb 2 oz  (112.095 kg)      Other studies Reviewed: Additional studies/ records that were reviewed today include: . Review of the above records demonstrates:    ASSESSMENT AND PLAN:  1. Essential HTN - BP is well controlled today  2. Hypothyroidism 3. Hyperlipidemia  4. Diabetes Mellitus 5. Left shoulder pain  - associated with some jaw pain . Has NS ST abn.   Will get a 2 day Lesiscan myoview study.  6. Obstructive sleep apnea    Current medicines are reviewed at length with the patient today.  The patient does not have concerns regarding medicines.  The following changes have been made:  no change  Labs/ tests ordered today include:  2 day Lexiscan myoview study    Disposition:   FU with me as needed      Philopater Mucha, Wonda Cheng, MD  07/23/2015 9:25 AM    [facility] [address], [place], [place]  [address] Phone: [phone]; Fax: [phone]   [facility]  [address] [place], [place]  [address] [phone]   Fax [phone]

## 2015-07-23 NOTE — Patient Instructions (Signed)
Medication Instructions:  Your physician recommends that you continue on your current medications as directed. Please refer to the Current Medication list given to you today.   Labwork: None Ordered   Testing/Procedures: Your physician has requested that you have a lexiscan myoview. For further information please visit [email or web address]. Please follow instruction sheet, as given.   Follow-Up: Your physician recommends that you schedule a follow-up appointment in: as needed with Dr. Nahser.    If you need a refill on your cardiac medications before your next appointment, please call your pharmacy.   Thank you for choosing [facility]! Michelle Swinyer, RN [phone]   

## 2015-07-26 ENCOUNTER — Ambulatory Visit (HOSPITAL_COMMUNITY): Payer: Self-pay

## 2015-07-27 ENCOUNTER — Ambulatory Visit (HOSPITAL_COMMUNITY): Payer: Self-pay

## 2015-07-28 ENCOUNTER — Telehealth (HOSPITAL_COMMUNITY): Payer: Self-pay | Admitting: *Deleted

## 2015-07-28 NOTE — Telephone Encounter (Signed)
Left message on voicemail per DPR in reference to upcoming appointment scheduled on 08/02/15 with detailed instructions given per Myocardial Perfusion Study Information Sheet for the test. LM to arrive 15 minutes early, and that it is imperative to arrive on time for appointment to keep from having the test rescheduled. If you need to cancel or reschedule your appointment, please call the office within 24 hours of your appointment. Failure to do so may result in a cancellation of your appointment, and a $50 no show fee. Phone number given for call back for any questions. Hubbard Robinson, RN

## 2015-07-29 ENCOUNTER — Telehealth (HOSPITAL_COMMUNITY): Payer: Self-pay | Admitting: *Deleted

## 2015-07-29 MED FILL — GABAPENTIN 100 MG CAPSULE: 100 | 30 days supply | Qty: 60 | Fill #1

## 2015-07-29 MED FILL — VENLAFAXINE HCL ER 75 MG CA: 75 | 30 days supply | Qty: 90 | Fill #1

## 2015-07-29 MED FILL — FUROSEMIDE 20 MG TABLET: 20 | 20 days supply | Qty: 40 | Fill #1

## 2015-07-29 NOTE — Telephone Encounter (Signed)
Patient given detailed instructions per Myocardial Perfusion Study Information Sheet for the test on 08/02/15 at 12:45. Patient notified to arrive 15 minutes early and that it is imperative to arrive on time for appointment to keep from having the test rescheduled.  If you need to cancel or reschedule your appointment, please call the office within 24 hours of your appointment. Failure to do so may result in a cancellation of your appointment, and a $50 no show fee. Patient verbalized understanding.[name]

## 2015-07-30 ENCOUNTER — Encounter: Payer: Self-pay | Admitting: Internal Medicine

## 2015-07-30 ENCOUNTER — Ambulatory Visit (INDEPENDENT_AMBULATORY_CARE_PROVIDER_SITE_OTHER): Payer: 59 | Admitting: Internal Medicine

## 2015-07-30 VITALS — BP 130/82 | HR 81 | Temp 98.1°F | Resp 12 | Wt 253.8 lb

## 2015-07-30 DIAGNOSIS — E039 Hypothyroidism, unspecified: Secondary | ICD-10-CM | POA: Diagnosis not present

## 2015-07-30 NOTE — Patient Instructions (Signed)
Please stop at the lab. ? ?Continue Levothyroxine 100 mcg daily. ? ?Take the thyroid hormone every day, with water, at least 30 minutes before breakfast, separated by at least 4 hours from: ?- acid reflux medications ?- calcium ?- iron ?- multivitamins ? ?Please come back for a follow-up appointment in 1 year. ? ?

## 2015-07-30 NOTE — Progress Notes (Signed)
Patient ID: [name], female   DOB: [birth year], 55 y.o.   MRN: [id number]   HPI  [name] is a 55 y.o.-year-old female, returning for f/u for hypothyroidism. Last visit 1 year ago.  She has a rotator cuff injury and bursitis in L shoulder. She also had an EKG >> abnormal >> stress test coming up.   She is seeing Dr. 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 She had voice rehab.  Reviewed hx: Pt. has been dx with hypothyroidism in 2007 (TSH 59; CK then 2471); started on Synthroid, then switched to Armour by Dr Dwyane Dee; then back on Levothyroxine (but sluggish and tired) so in 09/2012 switched to Armour 90 initially, then increased to 120 mg in 12/2012 >> she finally felt well on this dose.  She takes the thyroid hh: - fasting - with water - separated by >30 min from b'fast  - no PPI, no multivitamins   On Synthroid DAW >> started on 150 mcg >> gradually decreased to 100 mcg daily >> last TSH levels normal:  I reviewed pt's thyroid tests: Lab Results  Component Value Date   TSH 0.58 06/21/2015   TSH 1.77 03/15/2015   TSH 0.62 12/07/2014   TSH 0.68 09/04/2014   TSH 0.90 07/31/2014   TSH 1.13 05/22/2014   TSH 0.38 01/30/2014   TSH 0.983 10/27/2013   TSH 0.05* 10/21/2013   TSH 0.18* 08/22/2013   FREET4 0.80 03/22/2015   FREET4 0.92 07/31/2014   FREET4 0.86 01/30/2014   FREET4 0.90 10/21/2013   FREET4 0.82 08/22/2013   FREET4 1.44 07/10/2013   FREET4 1.27 05/26/2013   FREET4 1.02 03/31/2013   FREET4 0.63 01/14/2013   FREET4 0.74 12/18/2012    Pt denies feeling nodules in neck, + hoarseness (lost her voice summer 2014), no dysphagia/odynophagia, SOB with lying down.  Pt describes: - no cold intolerance; has hot flushes - + weight gain - + fatigue  - + mm aches  (has a chronically high CK level - 400s - under investigation) - no dry skin - no hair loss  She also has a history of TAH in 2001 - fibroids, BSO in 2005.   ROS: Constitutional: see HPI Eyes: no blurry vision, no xerophthalmia ENT: no sore throat, no nodules palpated in throat, no dysphagia/odynophagia, + hoarseness Cardiovascular: no CP/SOB/palpitations /leg swelling Respiratory: no cough/no SOB Gastrointestinal: no N/V/D/C/no acid reflux Musculoskeletal: + muscle/no joint aches Skin: no rashes, no hair loss Neurological: no tremors/numbness/tingling/dizziness  I reviewed pt's medications, allergies, PMH, social hx, family hx, and changes were documented in the history of present illness. Otherwise, unchanged from my initial visit note. She changed the dose of Prozac and Effexor; started fenofibrate and Neurontin.  PE: BP 130/82 mmHg  Pulse 81  Temp(Src) 98.1 F (36.7 C) (Oral)  Resp 12  Wt 253 lb 12.8 oz (115.123 kg)  SpO2 96% Body mass index is 42.23 kg/(m^2). Wt Readings from Last 3 Encounters:  07/30/15 253 lb 12.8 oz (115.123 kg)  07/23/15 253 lb 12.8 oz (115.123 kg)  06/21/15 251 lb 12.8 oz (114.216 kg)   Constitutional: obese, , in NAD Eyes: PERRLA, EOMI, no exophthalmos ENT: moist mucous membranes, no thyromegaly, no cervical lymphadenopathy Cardiovascular: RRR, No MRG Respiratory: CTA B Gastrointestinal: abdomen soft, NT, ND, BS+ Musculoskeletal: no  deformities, strength intact in all 4 Skin: moist, warm, no rashes, but severe acanthosis nigricans - neck, face, hands Neurological: no tremor with outstretched hands, DTR normal in all 4  ASSESSMENT: 1. Hypothyroidism - Thyroid U/S normal 01/2013  PLAN:  1. Patient with long-standing hypothyroidism, on Synthroid therapy, with normalized TSH on LT4 100 mcg daily - She does not appear to have a goiter, thyroid nodules, or neck compression symptoms - We discussed about correct intake of thyroid hh, fasting, with  water, separated by at least 30 minutes from breakfast, and separated by more than 4 hours from calcium, iron, multivitamins, acid reflux medications (PPIs). She is taking it correctly. She is off PPIs. - recent thyroid tests (05/2015) show normal LT4 replacement >> will not recheck today since she did not change anything in how she is taking thew LT4 since then - I will see her back in 1 year

## 2015-08-02 ENCOUNTER — Ambulatory Visit (HOSPITAL_COMMUNITY): Payer: 59 | Attending: Cardiovascular Disease

## 2015-08-02 VITALS — Ht 65.0 in | Wt 253.0 lb

## 2015-08-02 DIAGNOSIS — I10 Essential (primary) hypertension: Secondary | ICD-10-CM | POA: Diagnosis not present

## 2015-08-02 DIAGNOSIS — R6884 Jaw pain: Secondary | ICD-10-CM | POA: Insufficient documentation

## 2015-08-02 DIAGNOSIS — E119 Type 2 diabetes mellitus without complications: Secondary | ICD-10-CM | POA: Insufficient documentation

## 2015-08-02 DIAGNOSIS — M25512 Pain in left shoulder: Secondary | ICD-10-CM | POA: Diagnosis not present

## 2015-08-02 DIAGNOSIS — R0789 Other chest pain: Secondary | ICD-10-CM | POA: Diagnosis not present

## 2015-08-02 DIAGNOSIS — R9431 Abnormal electrocardiogram [ECG] [EKG]: Secondary | ICD-10-CM | POA: Diagnosis not present

## 2015-08-02 DIAGNOSIS — R0609 Other forms of dyspnea: Secondary | ICD-10-CM | POA: Insufficient documentation

## 2015-08-02 DIAGNOSIS — R002 Palpitations: Secondary | ICD-10-CM | POA: Insufficient documentation

## 2015-08-02 DIAGNOSIS — E785 Hyperlipidemia, unspecified: Secondary | ICD-10-CM | POA: Insufficient documentation

## 2015-08-02 DIAGNOSIS — R0602 Shortness of breath: Secondary | ICD-10-CM

## 2015-08-02 DIAGNOSIS — R9439 Abnormal result of other cardiovascular function study: Secondary | ICD-10-CM | POA: Diagnosis not present

## 2015-08-02 MED ORDER — AMINOPHYLLINE 25 MG/ML IV SOLN
75.0000 mg | Freq: Once | INTRAVENOUS | Status: AC
  Administered 2015-08-02: 75 mg via INTRAVENOUS

## 2015-08-02 MED ORDER — REGADENOSON 0.4 MG/5ML IV SOLN
0.4000 mg | Freq: Once | INTRAVENOUS | Status: AC
  Administered 2015-08-02: 0.4 mg via INTRAVENOUS

## 2015-08-02 MED ORDER — TECHNETIUM TC 99M SESTAMIBI GENERIC - CARDIOLITE
32.5000 | Freq: Once | INTRAVENOUS | Status: AC | PRN
  Administered 2015-08-02: 33 via INTRAVENOUS

## 2015-08-03 ENCOUNTER — Ambulatory Visit (HOSPITAL_COMMUNITY): Payer: 59 | Attending: Internal Medicine

## 2015-08-03 LAB — MYOCARDIAL PERFUSION IMAGING
LV dias vol: 80 mL (ref 46–106)
LV sys vol: 29 mL
Peak HR: 84 {beats}/min
RATE: 0.24
Rest HR: 76 {beats}/min
SDS: 3
SRS: 3
SSS: 5
TID: 0.95

## 2015-08-03 MED ORDER — TECHNETIUM TC 99M SESTAMIBI GENERIC - CARDIOLITE
32.3000 | Freq: Once | INTRAVENOUS | Status: AC | PRN
  Administered 2015-08-03: 32.3 via INTRAVENOUS

## 2015-08-03 MED FILL — SYNTHROID 100 MCG TABLET: 100 | 60 days supply | Qty: 60 | Fill #2

## 2015-08-20 ENCOUNTER — Encounter: Payer: Self-pay | Admitting: Family Medicine

## 2015-08-20 ENCOUNTER — Ambulatory Visit (INDEPENDENT_AMBULATORY_CARE_PROVIDER_SITE_OTHER): Payer: 59 | Admitting: Family Medicine

## 2015-08-20 VITALS — BP 136/88 | HR 85 | Temp 98.7°F | Ht 65.0 in | Wt 256.6 lb

## 2015-08-20 DIAGNOSIS — R5381 Other malaise: Secondary | ICD-10-CM

## 2015-08-20 DIAGNOSIS — M25512 Pain in left shoulder: Secondary | ICD-10-CM

## 2015-08-20 DIAGNOSIS — R252 Cramp and spasm: Secondary | ICD-10-CM | POA: Diagnosis not present

## 2015-08-20 DIAGNOSIS — M5441 Lumbago with sciatica, right side: Secondary | ICD-10-CM

## 2015-08-20 DIAGNOSIS — E119 Type 2 diabetes mellitus without complications: Secondary | ICD-10-CM | POA: Diagnosis not present

## 2015-08-20 DIAGNOSIS — M545 Low back pain, unspecified: Secondary | ICD-10-CM

## 2015-08-20 DIAGNOSIS — E782 Mixed hyperlipidemia: Secondary | ICD-10-CM

## 2015-08-20 DIAGNOSIS — E1169 Type 2 diabetes mellitus with other specified complication: Secondary | ICD-10-CM

## 2015-08-20 DIAGNOSIS — E038 Other specified hypothyroidism: Secondary | ICD-10-CM

## 2015-08-20 DIAGNOSIS — M7542 Impingement syndrome of left shoulder: Secondary | ICD-10-CM | POA: Diagnosis not present

## 2015-08-20 DIAGNOSIS — I10 Essential (primary) hypertension: Secondary | ICD-10-CM | POA: Diagnosis not present

## 2015-08-20 DIAGNOSIS — E559 Vitamin D deficiency, unspecified: Secondary | ICD-10-CM

## 2015-08-20 DIAGNOSIS — E669 Obesity, unspecified: Secondary | ICD-10-CM

## 2015-08-20 MED ORDER — GABAPENTIN 400 MG PO CAPS: 800.0000 mg | ORAL_CAPSULE | Freq: Every day | ORAL | Status: DC

## 2015-08-20 MED FILL — GABAPENTIN 400 MG CAPSULE: 400 | 90 days supply | Qty: 180 | Fill #0

## 2015-08-20 MED FILL — FUROSEMIDE 20 MG TABLET: 20 | 20 days supply | Qty: 40 | Fill #2

## 2015-08-20 NOTE — Assessment & Plan Note (Signed)
On Levothyroxine, continue to monitor, following with Endocrinology

## 2015-08-20 NOTE — Assessment & Plan Note (Signed)
hgba1c acceptable, minimize simple carbs. Increase exercise as tolerated. Continue current meds 

## 2015-08-20 NOTE — Patient Instructions (Signed)

## 2015-08-20 NOTE — Assessment & Plan Note (Signed)
Left shoulder has been following with [place] [facility] has her first appt with Dr Onnie Graham today

## 2015-08-20 NOTE — Assessment & Plan Note (Signed)
Encouraged heart healthy diet, increase exercise, avoid trans fats, consider a krill oil cap daily. Tolerating Fenofibrate. 

## 2015-08-20 NOTE — Progress Notes (Signed)
Patient ID: [name], female   DOB: 27-Aug-1960, 55 y.o.   MRN: [id number]   Subjective:    Patient ID: [name], female    DOB: August 02, 1960, 55 y.o.   MRN: [id number]  Chief Complaint  Patient presents with  . Follow-up    HPI Patient is in today for follow up with ongoing pain issues. Most notable today is left shoulder with ongoing pain and decreased ROM. No new trauma or injury. Has diffuse myalgias and arthralgias. No recent febrile illness. Denies CP/palp/SOB/HA/congestion/fevers/GI or GU c/o. Taking meds as prescribed. Does endorse fatigue and anhedonia.  Past Medical History  Diagnosis Date  . GERD (gastroesophageal reflux disease)   . Hypertension   . Thyroid disease     hypothyroidism  . Anxiety   . Depression   . Hyperlipidemia   . Chronic rhinosinusitis   . OSA (obstructive sleep apnea)   . Obesity   . Colon polyps   . Fibroid, uterine   . Hoarseness   . Glaucoma 12-13  . IBS (irritable bowel syndrome) 02/13/2011  . Perimenopause 10/05/2012  . Edema 01/06/2013  . Diabetes ([place]) 01/06/2013  . Chronic pain syndrome 01/06/2013  . Cough 01/06/2013  . Hypokalemia 02/05/2013  . Low back pain 03/03/2013  . Hyperglycemia 04/13/2013  . Hoarseness of voice 05/11/2013  . Obesity, unspecified 05/11/2013  . Raynaud disease 06/16/2013  . Hyperlipemia, mixed 06/06/2007    Qualifier: Diagnosis of  By: Wynona Luna She feels Lipitor caused increased low back pain and weakness    . Muscle cramp 05/22/2014  . Diabetes mellitus type 2 in obese [facility]) 01/06/2013    Sees Dr Calvert Cantor for eye exam Does not see podiatry, foot exam today unremarkable except for thick cracking skin on heals   . Pain in joint, shoulder region 06/27/2015    Past Surgical History  Procedure Laterality Date  . Cholecystectomy    . Abdominal hysterectomy      Family History  Problem Relation Age of Onset  . Alcohol abuse Father   . Cancer Father     renal and colon  . Hyperlipidemia  Father   . Hypertension Father   . Diabetes    . Cancer Paternal Grandfather     colon    Social History   Social History  . Marital Status: Single    Spouse Name: N/A  . Number of Children: N/A  . Years of Education: N/A   Occupational History  . Not on file.   Social History Main Topics  . Smoking status: Former Smoker -- 0.50 packs/day for 30 years    Types: Cigarettes    Quit date: 04/25/2003  . Smokeless tobacco: Never Used     Comment: smoked since age 62  . Alcohol Use: No  . Drug Use: Not on file  . Sexual Activity: Not on file   Other Topics Concern  . Not on file   Social History Narrative   Endo-- Dr Dwyane Dee   ENT--Dr shoemaker   GI--Dr Buccini   Pulm--Dr Clance   Rheum--Dr Charlestine Night          Outpatient Prescriptions Prior to Visit  Medication Sig Dispense Refill  . albuterol (PROVENTIL HFA;VENTOLIN HFA) 108 (90 BASE) MCG/ACT inhaler Inhale 2 puffs into the lungs every 4 (four) hours as needed for wheezing or shortness of breath. 1 Inhaler 6  . ALPRAZolam (XANAX) 0.25 MG tablet TAKE 1/2 TABLET BY MOUTH 2 TIMES A DAY FOR 14 DAYS,  THEN 1/2 TAB DAILY FOR 7 DAYS, THE 1/2 - 1 TAB DAILY AS NEEDED 40 tablet 1  . clotrimazole (LOTRIMIN) 1 % cream Apply topically 2 (two) times daily. 60 g 1  . fenofibrate micronized (ANTARA) 130 MG capsule TAKE 1 CAPSULE BY MOUTH DAILY BEFORE BREAKFAST 30 capsule 3  . FLUoxetine (PROZAC) 10 MG capsule Take 2 capsules (20 mg total) by mouth daily. 60 capsule 3  . furosemide (LASIX) 20 MG tablet TAKE 1-2 TABLETS BY MOUTH DAILY AS NEEDED FOR FLUID OR EDEMA 40 tablet 6  . gabapentin (NEURONTIN) 100 MG capsule Take 1 capsule (100 mg total) by mouth 2 (two) times daily. During the day 60 capsule 3  . glucose blood (TRUE CARE TEST STRIP PACK) test strip Use as instructed 100 each 3  . hyoscyamine (LEVSIN, ANASPAZ) 0.125 MG tablet Take 1 tablet (0.125 mg total) by mouth every 4 (four) hours as needed for cramping. 40 tablet 1  . ipratropium  (ATROVENT) 0.06 % nasal spray Place 2 sprays into both nostrils 4 (four) times daily. 15 mL 12  . latanoprost (XALATAN) 0.005 % ophthalmic solution Place 1 drop into both eyes at bedtime.    . metFORMIN (GLUCOPHAGE-XR) 500 MG 24 hr tablet TAKE 2 TABLETS BY MOUTH DAILY WITH BREAKFAST. 60 tablet 11  . Nebivolol HCl (BYSTOLIC) 20 MG TABS Take 1 tablet (20 mg total) by mouth daily. 90 tablet 2  . Spacer/Aero-Holding Dorise Bullion Use as directed 1 each 1  . spironolactone (ALDACTONE) 25 MG tablet Take 1 tablet (25 mg total) by mouth 2 (two) times daily. 60 tablet 1  . SYNTHROID 100 MCG tablet TAKE 1 TABLET BY MOUTH DAILY BEFORE BREAKFAST 60 tablet 2  . TRUEPLUS LANCETS 30G MISC 1 Device by Does not apply route daily as needed. DX 250.00 by Does not apply route daily as needed. DX 250.00 100 each 2  . venlafaxine XR (EFFEXOR XR) 75 MG 24 hr capsule Take 3 capsules (225 mg total) by mouth daily with breakfast. Patient needs capsules, does not tolerate the capsules 90 capsule 3  . Vitamin D, Ergocalciferol, (DRISDOL) 50000 UNITS CAPS capsule Take 1 capsule (50,000 Units total) by mouth every 7 (seven) days. 12 capsule 1  . gabapentin (NEURONTIN) 400 MG capsule Take 2 capsules (800 mg total) by mouth at bedtime. 60 capsule 3   No facility-administered medications prior to visit.    Allergies  Allergen Reactions  . Losartan Cough  . Aspirin     REACTION: Upset stomach  . Atorvastatin     myalgias  . Codeine   . Amoxicillin Rash    Review of Systems  Constitutional: Positive for malaise/fatigue. Negative for fever.  HENT: Negative for congestion.   Eyes: Negative for blurred vision.  Respiratory: Positive for shortness of breath.   Cardiovascular: Negative for chest pain, palpitations and leg swelling.  Gastrointestinal: Negative for nausea, abdominal pain and blood in stool.  Genitourinary: Negative for dysuria and frequency.  Musculoskeletal: Positive for myalgias, back pain, joint pain and  neck pain. Negative for falls.  Skin: Negative for rash.  Neurological: Negative for dizziness, loss of consciousness and headaches.  Endo/Heme/Allergies: Negative for environmental allergies.  Psychiatric/Behavioral: Positive for depression. The patient is not nervous/anxious.        Objective:    Physical Exam  Constitutional: She is oriented to person, place, and time. She appears well-developed and well-nourished. No distress.  HENT:  Head: Normocephalic and atraumatic.  Nose: Nose normal.  Eyes: Right  eye exhibits no discharge. Left eye exhibits no discharge.  Neck: Normal range of motion. Neck supple.  Cardiovascular: Normal rate and regular rhythm.   No murmur heard. Pulmonary/Chest: Effort normal and breath sounds normal.  Abdominal: Soft. Bowel sounds are normal. There is no tenderness.  Musculoskeletal: She exhibits no edema.  Neurological: She is alert and oriented to person, place, and time.  Skin: Skin is warm and dry.  Psychiatric: She has a normal mood and affect.  Nursing note and vitals reviewed.   BP 136/88 mmHg  Pulse 85  Temp(Src) 98.7 F (37.1 C) (Oral)  Ht 5\' 5"  (1.651 m)  Wt 256 lb 9.6 oz (116.393 kg)  BMI 42.70 kg/m2  SpO2 95% Wt Readings from Last 3 Encounters:  08/20/15 256 lb 9.6 oz (116.393 kg)  08/02/15 253 lb (114.76 kg)  07/30/15 253 lb 12.8 oz (115.123 kg)     Lab Results  Component Value Date   WBC 5.3 06/21/2015   HGB 13.4 06/21/2015   HCT 39.8 06/21/2015   PLT 339.0 06/21/2015   GLUCOSE 78 06/21/2015   CHOL 225* 06/21/2015   TRIG 129.0 06/21/2015   HDL 50.50 06/21/2015   LDLDIRECT 160.8 01/30/2014   LDLCALC 148* 06/21/2015   ALT 28 06/21/2015   AST 29 06/21/2015   NA 139 06/21/2015   K 4.0 06/21/2015   CL 107 06/21/2015   CREATININE 0.93 06/21/2015   BUN 9 06/21/2015   CO2 24 06/21/2015   TSH 0.58 06/21/2015   HGBA1C 6.4 06/21/2015   MICROALBUR <0.7 09/04/2014    Lab Results  Component Value Date   TSH 0.58  06/21/2015   Lab Results  Component Value Date   WBC 5.3 06/21/2015   HGB 13.4 06/21/2015   HCT 39.8 06/21/2015   MCV 88.2 06/21/2015   PLT 339.0 06/21/2015   Lab Results  Component Value Date   NA 139 06/21/2015   K 4.0 06/21/2015   CO2 24 06/21/2015   GLUCOSE 78 06/21/2015   BUN 9 06/21/2015   CREATININE 0.93 06/21/2015   BILITOT 0.3 06/21/2015   ALKPHOS 49 06/21/2015   AST 29 06/21/2015   ALT 28 06/21/2015   PROT 7.4 06/21/2015   ALBUMIN 4.5 06/21/2015   CALCIUM 9.6 06/21/2015   GFR 80.63 06/21/2015   Lab Results  Component Value Date   CHOL 225* 06/21/2015   Lab Results  Component Value Date   HDL 50.50 06/21/2015   Lab Results  Component Value Date   LDLCALC 148* 06/21/2015   Lab Results  Component Value Date   TRIG 129.0 06/21/2015   Lab Results  Component Value Date   CHOLHDL 4 06/21/2015   Lab Results  Component Value Date   HGBA1C 6.4 06/21/2015       Assessment & Plan:   Problem List Items Addressed This Visit    Vitamin D deficiency    Continue supplements      Pain in joint, shoulder region    Left shoulder has been following with [place] [facility] has her first appt with Dr Onnie Graham today      Relevant Orders   Ambulatory referral to Physical Therapy   CBC   TSH   Hemoglobin A1c   Lipid panel   Comprehensive metabolic panel   Obesity    Encouraged DASH diet, decrease po intake and increase exercise as tolerated. Needs 7-8 hours of sleep nightly. Avoid trans fats, eat small, frequent meals every 4-5 hours with lean proteins, complex carbs and healthy  fats. Minimize simple carbs      Muscle cramp    Follows with Dr Trudie Reed of [facility], CPK continues to run high.  Has been so for years unclear etiology, continues to struggle with chronic pain in back, shoulder, myalgias      Relevant Orders   CBC   TSH   Hemoglobin A1c   Lipid panel   Comprehensive metabolic panel   Low back pain    No pain while sitting but has trboule  walking due to pain in b/l posterior hips with associated weakness in b/l legs      Relevant Orders   Ambulatory referral to Physical Therapy   CBC   TSH   Hemoglobin A1c   Lipid panel   Comprehensive metabolic panel   CBC   TSH   Hemoglobin A1c   Lipid panel   Comprehensive metabolic panel   Hypothyroidism    On Levothyroxine, continue to monitor, following with Endocrinology      Relevant Orders   CBC   TSH   Hemoglobin A1c   Lipid panel   Comprehensive metabolic panel   Hyperlipemia, mixed    Encouraged heart healthy diet, increase exercise, avoid trans fats, consider a krill oil cap daily. Tolerating Fenofibrate.      Relevant Orders   CBC   TSH   Hemoglobin A1c   Lipid panel   Comprehensive metabolic panel   Essential hypertension - Primary    Well controlled, no changes to meds. Encouraged heart healthy diet such as the DASH diet and exercise as tolerated.       Relevant Orders   CBC   TSH   Hemoglobin A1c   Lipid panel   Comprehensive metabolic panel   Diabetes mellitus type 2 in obese (HCC)    hgba1c acceptable, minimize simple carbs. Increase exercise as tolerated. Continue current meds      Relevant Orders   CBC   TSH   Hemoglobin A1c   Lipid panel   Comprehensive metabolic panel    Other Visit Diagnoses    Physical deconditioning        Relevant Orders    Ambulatory referral to Physical Therapy    CBC    TSH    Hemoglobin A1c    Lipid panel    Comprehensive metabolic panel       I am having Ms. Weyland maintain her clotrimazole, latanoprost, TRUEPLUS LANCETS 30G, glucose blood, hyoscyamine, metFORMIN, spironolactone, Spacer/Aero-Holding Chambers, ipratropium, albuterol, ALPRAZolam, fenofibrate micronized, Vitamin D (Ergocalciferol), SYNTHROID, FLUoxetine, venlafaxine XR, gabapentin, furosemide, Nebivolol HCl, and gabapentin.  Meds ordered this encounter  Medications  . gabapentin (NEURONTIN) 400 MG capsule    Sig: Take 2 capsules (800  mg total) by mouth at bedtime.    Dispense:  180 capsule    Refill:  1     Courtland Coppa, MD

## 2015-08-20 NOTE — Assessment & Plan Note (Signed)
Well controlled, no changes to meds. Encouraged heart healthy diet such as the DASH diet and exercise as tolerated.  °

## 2015-08-20 NOTE — Assessment & Plan Note (Signed)
No pain while sitting but has trboule walking due to pain in b/l posterior hips with associated weakness in b/l legs

## 2015-08-20 NOTE — Assessment & Plan Note (Signed)
Follows with Dr Trudie Reed of [facility], CPK continues to run high.  Has been so for years unclear etiology, continues to struggle with chronic pain in back, shoulder, myalgias

## 2015-08-31 NOTE — Assessment & Plan Note (Signed)
Continue supplements

## 2015-08-31 NOTE — Assessment & Plan Note (Signed)
Encouraged DASH diet, decrease po intake and increase exercise as tolerated. Needs 7-8 hours of sleep nightly. Avoid trans fats, eat small, frequent meals every 4-5 hours with lean proteins, complex carbs and healthy fats. Minimize simple carbs 

## 2015-09-01 ENCOUNTER — Encounter: Payer: Self-pay | Admitting: Family Medicine

## 2015-09-02 ENCOUNTER — Other Ambulatory Visit: Payer: Self-pay | Admitting: Family Medicine

## 2015-09-02 MED FILL — VIT D2 1.25 MG (50,000 UNIT: 1.25 MG | 84 days supply | Qty: 12 | Fill #1

## 2015-09-02 MED FILL — METFORMIN HCL ER 500 MG TAB: 500 | 30 days supply | Qty: 60 | Fill #0

## 2015-09-02 MED FILL — VENLAFAXINE HCL ER 75 MG CA: 75 | 30 days supply | Qty: 90 | Fill #2

## 2015-09-02 MED FILL — GABAPENTIN 100 MG CAPSULE: 100 | 30 days supply | Qty: 60 | Fill #2

## 2015-09-02 MED FILL — FLUoxetine HCL 10 MG CAPS: 10 | 30 days supply | Qty: 90 | Fill #0

## 2015-09-07 ENCOUNTER — Encounter: Payer: Self-pay | Admitting: Family Medicine

## 2015-09-22 MED FILL — FUROSEMIDE 20 MG TABLET: 20 | 20 days supply | Qty: 40 | Fill #3

## 2015-09-24 ENCOUNTER — Other Ambulatory Visit: Payer: Self-pay | Admitting: Family Medicine

## 2015-09-24 ENCOUNTER — Other Ambulatory Visit (INDEPENDENT_AMBULATORY_CARE_PROVIDER_SITE_OTHER): Payer: 59

## 2015-09-24 DIAGNOSIS — I10 Essential (primary) hypertension: Secondary | ICD-10-CM | POA: Diagnosis not present

## 2015-09-24 DIAGNOSIS — M5441 Lumbago with sciatica, right side: Secondary | ICD-10-CM

## 2015-09-24 DIAGNOSIS — R252 Cramp and spasm: Secondary | ICD-10-CM | POA: Diagnosis not present

## 2015-09-24 DIAGNOSIS — R5381 Other malaise: Secondary | ICD-10-CM

## 2015-09-24 DIAGNOSIS — E119 Type 2 diabetes mellitus without complications: Secondary | ICD-10-CM

## 2015-09-24 DIAGNOSIS — E1169 Type 2 diabetes mellitus with other specified complication: Secondary | ICD-10-CM

## 2015-09-24 DIAGNOSIS — E669 Obesity, unspecified: Secondary | ICD-10-CM

## 2015-09-24 DIAGNOSIS — M25512 Pain in left shoulder: Secondary | ICD-10-CM

## 2015-09-24 DIAGNOSIS — E782 Mixed hyperlipidemia: Secondary | ICD-10-CM

## 2015-09-24 DIAGNOSIS — E038 Other specified hypothyroidism: Secondary | ICD-10-CM

## 2015-09-24 DIAGNOSIS — M545 Low back pain, unspecified: Secondary | ICD-10-CM

## 2015-09-24 LAB — TSH: TSH: 0.84 u[IU]/mL (ref 0.35–4.50)

## 2015-09-24 LAB — COMPREHENSIVE METABOLIC PANEL
ALT: 27 U/L (ref 0–35)
AST: 27 U/L (ref 0–37)
Albumin: 4.2 g/dL (ref 3.5–5.2)
Alkaline Phosphatase: 62 U/L (ref 39–117)
BUN: 9 mg/dL (ref 6–23)
CO2: 23 mEq/L (ref 19–32)
Calcium: 9.4 mg/dL (ref 8.4–10.5)
Chloride: 107 mEq/L (ref 96–112)
Creatinine, Ser: 0.93 mg/dL (ref 0.40–1.20)
GFR: 80.55 mL/min (ref >60.00)
Glucose, Bld: 122 mg/dL — ABNORMAL HIGH (ref 70–99)
Potassium: 3.8 mEq/L (ref 3.5–5.1)
Sodium: 140 mEq/L (ref 135–145)
Total Bilirubin: 0.3 mg/dL (ref 0.2–1.2)
Total Protein: 6.8 g/dL (ref 6.0–8.3)

## 2015-09-24 LAB — LIPID PANEL
Cholesterol: 224 mg/dL — ABNORMAL HIGH (ref 0–200)
HDL: 36.9 mg/dL — ABNORMAL LOW (ref >39.00)
LDL Cholesterol: 152 mg/dL — ABNORMAL HIGH (ref 0–99)
NonHDL: 187.3
Total CHOL/HDL Ratio: 6
Triglycerides: 176 mg/dL — ABNORMAL HIGH (ref 0.0–149.0)
VLDL: 35.2 mg/dL (ref 0.0–40.0)

## 2015-09-24 LAB — CBC
HCT: 38.3 % (ref 36.0–46.0)
Hemoglobin: 12.9 g/dL (ref 12.0–15.0)
MCHC: 33.6 g/dL (ref 30.0–36.0)
MCV: 87.6 fl (ref 78.0–100.0)
Platelets: 338 10*3/uL (ref 150.0–400.0)
RBC: 4.37 Mil/uL (ref 3.87–5.11)
RDW: 14.3 % (ref 11.5–15.5)
WBC: 4.1 10*3/uL (ref 4.0–10.5)

## 2015-09-24 LAB — HEMOGLOBIN A1C: Hgb A1c MFr Bld: 6.3 % (ref 4.6–6.5)

## 2015-09-24 MED FILL — VENTOLIN HFA 90 MCG INHALER: 108 (90 BAS | 17 days supply | Qty: 18 | Fill #0

## 2015-09-28 ENCOUNTER — Other Ambulatory Visit: Payer: Self-pay | Admitting: Family Medicine

## 2015-09-28 ENCOUNTER — Encounter: Payer: Self-pay | Admitting: Family Medicine

## 2015-09-28 MED ORDER — AMLODIPINE BESYLATE 5 MG PO TABS: 5.0000 mg | ORAL_TABLET | Freq: Every day | ORAL | Status: DC

## 2015-09-28 MED FILL — AMLODIPINE BESYLATE 5 MG TA: 5 | 30 days supply | Qty: 30 | Fill #0

## 2015-10-01 ENCOUNTER — Encounter: Payer: Self-pay | Admitting: Family Medicine

## 2015-10-01 ENCOUNTER — Other Ambulatory Visit: Payer: Self-pay | Admitting: Family Medicine

## 2015-10-01 MED ORDER — SYNTHROID 100 MCG PO TABS: ORAL_TABLET | ORAL | Status: DC

## 2015-10-01 MED FILL — SYNTHROID 100 MCG TABLET: 100 | 60 days supply | Qty: 60 | Fill #0

## 2015-10-03 ENCOUNTER — Other Ambulatory Visit: Payer: Self-pay | Admitting: Family Medicine

## 2015-10-03 MED ORDER — SYNTHROID 100 MCG PO TABS: ORAL_TABLET | ORAL | Status: DC

## 2015-10-04 ENCOUNTER — Other Ambulatory Visit: Payer: Self-pay

## 2015-10-04 ENCOUNTER — Encounter: Payer: Self-pay | Admitting: Family Medicine

## 2015-10-04 MED ORDER — SYNTHROID 100 MCG PO TABS: ORAL_TABLET | ORAL | Status: DC

## 2015-10-12 DIAGNOSIS — S43432D Superior glenoid labrum lesion of left shoulder, subsequent encounter: Secondary | ICD-10-CM | POA: Diagnosis not present

## 2015-10-12 DIAGNOSIS — G8918 Other acute postprocedural pain: Secondary | ICD-10-CM | POA: Diagnosis not present

## 2015-10-12 DIAGNOSIS — M24112 Other articular cartilage disorders, left shoulder: Secondary | ICD-10-CM | POA: Diagnosis not present

## 2015-10-12 DIAGNOSIS — M7542 Impingement syndrome of left shoulder: Secondary | ICD-10-CM | POA: Diagnosis not present

## 2015-10-12 DIAGNOSIS — M75122 Complete rotator cuff tear or rupture of left shoulder, not specified as traumatic: Secondary | ICD-10-CM | POA: Diagnosis not present

## 2015-10-12 DIAGNOSIS — M19012 Primary osteoarthritis, left shoulder: Secondary | ICD-10-CM | POA: Diagnosis not present

## 2015-10-12 DIAGNOSIS — R600 Localized edema: Secondary | ICD-10-CM | POA: Diagnosis not present

## 2015-10-13 ENCOUNTER — Encounter: Payer: Self-pay | Admitting: Family Medicine

## 2015-10-13 MED FILL — OXYCODONE/APAP 5-325: 5-325 | 4 days supply | Qty: 40 | Fill #0

## 2015-10-13 MED FILL — diazePAM 5 MG TABS: 5 | 10 days supply | Qty: 40 | Fill #0

## 2015-10-13 MED FILL — NAPROXEN 500 MG TABLET: 500 | 30 days supply | Qty: 60 | Fill #0

## 2015-10-13 MED FILL — ONDANSETRON HCL 4 MG TABLET: 4 | 4 days supply | Qty: 15 | Fill #0

## 2015-10-14 ENCOUNTER — Other Ambulatory Visit: Payer: Self-pay | Admitting: Family Medicine

## 2015-10-14 DIAGNOSIS — G4730 Sleep apnea, unspecified: Secondary | ICD-10-CM

## 2015-10-15 ENCOUNTER — Ambulatory Visit: Payer: Self-pay | Admitting: Family Medicine

## 2015-10-15 DIAGNOSIS — G4733 Obstructive sleep apnea (adult) (pediatric): Secondary | ICD-10-CM | POA: Diagnosis not present

## 2015-10-15 DIAGNOSIS — I10 Essential (primary) hypertension: Secondary | ICD-10-CM | POA: Diagnosis not present

## 2015-10-19 ENCOUNTER — Encounter: Payer: Self-pay | Admitting: Family Medicine

## 2015-10-19 MED FILL — SPIRONOLACTONE 25 MG TABLET: 25 | 90 days supply | Qty: 180 | Fill #1

## 2015-10-19 MED FILL — FUROSEMIDE 20 MG TABLET: 20 | 20 days supply | Qty: 40 | Fill #4

## 2015-10-20 DIAGNOSIS — M7542 Impingement syndrome of left shoulder: Secondary | ICD-10-CM | POA: Diagnosis not present

## 2015-10-23 DIAGNOSIS — M7542 Impingement syndrome of left shoulder: Secondary | ICD-10-CM | POA: Diagnosis not present

## 2015-10-23 DIAGNOSIS — M19012 Primary osteoarthritis, left shoulder: Secondary | ICD-10-CM | POA: Diagnosis not present

## 2015-10-23 DIAGNOSIS — M75122 Complete rotator cuff tear or rupture of left shoulder, not specified as traumatic: Secondary | ICD-10-CM | POA: Diagnosis not present

## 2015-10-23 DIAGNOSIS — R600 Localized edema: Secondary | ICD-10-CM | POA: Diagnosis not present

## 2015-10-27 ENCOUNTER — Other Ambulatory Visit: Payer: Self-pay | Admitting: Family Medicine

## 2015-10-27 MED FILL — FENOFIBRATE 130 MG CAPSULE: 130 | 30 days supply | Qty: 30 | Fill #0

## 2015-10-27 MED FILL — GABAPENTIN 100 MG CAPSULE: 100 | 30 days supply | Qty: 60 | Fill #3

## 2015-10-27 MED FILL — AMLODIPINE BESYLATE 5 MG TA: 5 | 30 days supply | Qty: 30 | Fill #1

## 2015-10-28 MED FILL — BYSTOLIC 20 MG TABLET: 20 | 90 days supply | Qty: 90 | Fill #1

## 2015-11-03 DIAGNOSIS — M7542 Impingement syndrome of left shoulder: Secondary | ICD-10-CM | POA: Diagnosis not present

## 2015-11-15 MED FILL — VENLAFAXINE HCL ER 75 MG CA: 75 | 30 days supply | Qty: 90 | Fill #3

## 2015-11-22 ENCOUNTER — Encounter: Payer: Self-pay | Admitting: Family Medicine

## 2015-11-22 ENCOUNTER — Ambulatory Visit (INDEPENDENT_AMBULATORY_CARE_PROVIDER_SITE_OTHER): Payer: 59 | Admitting: Family Medicine

## 2015-11-22 VITALS — BP 120/80 | HR 68 | Temp 98.2°F | Ht 65.0 in | Wt 253.0 lb

## 2015-11-22 DIAGNOSIS — E038 Other specified hypothyroidism: Secondary | ICD-10-CM | POA: Diagnosis not present

## 2015-11-22 DIAGNOSIS — G4733 Obstructive sleep apnea (adult) (pediatric): Secondary | ICD-10-CM

## 2015-11-22 DIAGNOSIS — E669 Obesity, unspecified: Secondary | ICD-10-CM

## 2015-11-22 DIAGNOSIS — E782 Mixed hyperlipidemia: Secondary | ICD-10-CM

## 2015-11-22 DIAGNOSIS — I10 Essential (primary) hypertension: Secondary | ICD-10-CM

## 2015-11-22 DIAGNOSIS — E1169 Type 2 diabetes mellitus with other specified complication: Secondary | ICD-10-CM

## 2015-11-22 DIAGNOSIS — K219 Gastro-esophageal reflux disease without esophagitis: Secondary | ICD-10-CM

## 2015-11-22 DIAGNOSIS — E559 Vitamin D deficiency, unspecified: Secondary | ICD-10-CM

## 2015-11-22 DIAGNOSIS — E119 Type 2 diabetes mellitus without complications: Secondary | ICD-10-CM

## 2015-11-22 DIAGNOSIS — M7542 Impingement syndrome of left shoulder: Secondary | ICD-10-CM | POA: Diagnosis not present

## 2015-11-22 MED ORDER — VITAMIN D (ERGOCALCIFEROL) 1.25 MG (50000 UNIT) PO CAPS: 50000.0000 [IU] | ORAL_CAPSULE | ORAL | 1 refills | Status: DC

## 2015-11-22 MED ORDER — FENOFIBRATE MICRONIZED 130 MG PO CAPS: 130.0000 mg | ORAL_CAPSULE | Freq: Every day | ORAL | 6 refills | Status: DC

## 2015-11-22 MED FILL — FENOFIBRATE 130 MG CAPSULE: 130 | 30 days supply | Qty: 30 | Fill #0

## 2015-11-22 MED FILL — VIT D2 1.25 MG (50,000 UNIT: 1.25 MG | 84 days supply | Qty: 12 | Fill #0

## 2015-11-22 NOTE — Progress Notes (Signed)
Pre visit review using our clinic review tool, if applicable. No additional management support is needed unless otherwise documented below in the visit note. 

## 2015-11-22 NOTE — Patient Instructions (Signed)

## 2015-11-24 DIAGNOSIS — M7542 Impingement syndrome of left shoulder: Secondary | ICD-10-CM | POA: Diagnosis not present

## 2015-11-29 DIAGNOSIS — M7542 Impingement syndrome of left shoulder: Secondary | ICD-10-CM | POA: Diagnosis not present

## 2015-11-29 MED FILL — AMLODIPINE BESYLATE 5 MG TA: 5 | 30 days supply | Qty: 30 | Fill #2

## 2015-11-29 MED FILL — GABAPENTIN 400 MG CAPSULE: 400 | 90 days supply | Qty: 180 | Fill #1

## 2015-11-29 MED FILL — SYNTHROID 100 MCG TABLET: 100 | 60 days supply | Qty: 60 | Fill #1

## 2015-12-01 NOTE — Assessment & Plan Note (Addendum)
Encouraged DASH diet, decrease po intake and increase exercise as tolerated. Needs 7-8 hours of sleep nightly. Avoid trans fats, eat small, frequent meals every 4-5 hours with lean proteins, complex carbs and healthy fats. Minimize simple carbs 

## 2015-12-01 NOTE — Assessment & Plan Note (Signed)
hgba1c acceptable, minimize simple carbs. Increase exercise as tolerated. Continue current meds 

## 2015-12-01 NOTE — Assessment & Plan Note (Signed)
Encouraged heart healthy diet, increase exercise, avoid trans fats, consider a krill oil cap daily 

## 2015-12-01 NOTE — Assessment & Plan Note (Signed)
Avoid offending foods, take probiotics. Do not eat large meals in late evening and consider raising head of bed.  

## 2015-12-01 NOTE — Assessment & Plan Note (Signed)
On Levothyroxine, continue to monitor 

## 2015-12-01 NOTE — Progress Notes (Signed)
Patient ID: [name], female   DOB: [birth year], 55 y.o.   MRN: [id number]   Subjective:    Patient ID: [name], female    DOB: 04/01/1961, 55 y.o.   MRN: [id number]  Chief Complaint  Patient presents with  . Follow-up    HPI Patient is in today for follow up. Her left arm is in a sling after having a rotator cuff tear repaired. Has started therapy and is improving. No recent illness otherwise. She is struggling with fatigue and myalgias. Still struggling with anxiety and anhedonia but no suicidal ideation. Denies CP/palp/SOB/HA/congestion/fevers/GI or GU c/o. Taking meds as prescribed  Past Medical History:  Diagnosis Date  . Anxiety   . Chronic pain syndrome 01/06/2013  . Chronic rhinosinusitis   . Colon polyps   . Cough 01/06/2013  . Depression   . Diabetes ([place]) 01/06/2013  . Diabetes mellitus type 2 in obese [facility]) 01/06/2013   Sees Dr Calvert Cantor for eye exam Does not see podiatry, foot exam today unremarkable except for thick cracking skin on heals   . Edema 01/06/2013  . Fibroid, uterine   . GERD (gastroesophageal reflux disease)   . Glaucoma 12-13  . Hoarseness   . Hoarseness of voice 05/11/2013  . Hyperglycemia 04/13/2013  . Hyperlipemia, mixed 06/06/2007   Qualifier: Diagnosis of  By: Wynona Luna She feels Lipitor caused increased low back pain and weakness    . Hyperlipidemia   . Hypertension   . Hypokalemia 02/05/2013  . IBS (irritable bowel syndrome) 02/13/2011  . Low back pain 03/03/2013  . Muscle cramp 05/22/2014  . Obesity   . Obesity, unspecified 05/11/2013  . OSA (obstructive sleep apnea)   . Pain in joint, shoulder region 06/27/2015  . Perimenopause 10/05/2012  . Raynaud disease 06/16/2013  . Thyroid disease    hypothyroidism    Past Surgical History:  Procedure Laterality Date  . ABDOMINAL HYSTERECTOMY    . CHOLECYSTECTOMY      Family History  Problem Relation Age of Onset  . Alcohol abuse Father   . Cancer Father     renal and  colon  . Hyperlipidemia Father   . Hypertension Father   . Diabetes    . Cancer Paternal Grandfather     colon    Social History   Social History  . Marital status: Single    Spouse name: N/A  . Number of children: N/A  . Years of education: N/A   Occupational History  . Not on file.   Social History Main Topics  . Smoking status: Former Smoker    Packs/day: 0.50    Years: 30.00    Types: Cigarettes    Quit date: 04/25/2003  . Smokeless tobacco: Never Used     Comment: smoked since age 86  . Alcohol use No  . Drug use: Unknown  . Sexual activity: Not on file   Other Topics Concern  . Not on file   Social History Narrative   Endo-- Dr Dwyane Dee   ENT--Dr shoemaker   GI--Dr Buccini   Pulm--Dr Clance   Rheum--Dr Charlestine Night          Outpatient Medications Prior to Visit  Medication Sig Dispense Refill  . ALPRAZolam (XANAX) 0.25 MG tablet TAKE 1/2 TABLET BY MOUTH 2 TIMES A DAY FOR 14 DAYS, THEN 1/2 TAB DAILY FOR 7 DAYS, THE 1/2 - 1 TAB DAILY AS NEEDED 40 tablet 1  . amLODipine (NORVASC) 5 MG tablet Take 1  tablet (5 mg total) by mouth daily. 30 tablet 3  . clotrimazole (LOTRIMIN) 1 % cream Apply topically 2 (two) times daily. 60 g 1  . FLUoxetine (PROZAC) 10 MG capsule TAKE 3 CAPSULES BY MOUTH DAILY. (Patient taking differently: take 1 capsule daily) 90 capsule PRN  . furosemide (LASIX) 20 MG tablet TAKE 1-2 TABLETS BY MOUTH DAILY AS NEEDED FOR FLUID OR EDEMA 40 tablet 6  . gabapentin (NEURONTIN) 100 MG capsule Take 1 capsule (100 mg total) by mouth 2 (two) times daily. During the day 60 capsule 3  . gabapentin (NEURONTIN) 400 MG capsule Take 2 capsules (800 mg total) by mouth at bedtime. 180 capsule 1  . glucose blood (TRUE CARE TEST STRIP PACK) test strip Use as instructed 100 each 3  . hyoscyamine (LEVSIN, ANASPAZ) 0.125 MG tablet Take 1 tablet (0.125 mg total) by mouth every 4 (four) hours as needed for cramping. 40 tablet 1  . ipratropium (ATROVENT) 0.06 % nasal spray  Place 2 sprays into both nostrils 4 (four) times daily. 15 mL 12  . latanoprost (XALATAN) 0.005 % ophthalmic solution Place 1 drop into both eyes at bedtime.    . metFORMIN (GLUCOPHAGE-XR) 500 MG 24 hr tablet TAKE 2 TABLETS BY MOUTH DAILY WITH BREAKFAST. 60 tablet PRN  . Nebivolol HCl (BYSTOLIC) 20 MG TABS Take 1 tablet (20 mg total) by mouth daily. 90 tablet 2  . Spacer/Aero-Holding Dorise Bullion Use as directed 1 each 1  . spironolactone (ALDACTONE) 25 MG tablet Take 1 tablet (25 mg total) by mouth 2 (two) times daily. 60 tablet 1  . SYNTHROID 100 MCG tablet TAKE 1 TABLET BY MOUTH DAILY BEFORE BREAKFAST 30 tablet 5  . TRUEPLUS LANCETS 30G MISC 1 Device by Does not apply route daily as needed. DX 250.00 by Does not apply route daily as needed. DX 250.00 100 each 2  . venlafaxine XR (EFFEXOR XR) 75 MG 24 hr capsule Take 3 capsules (225 mg total) by mouth daily with breakfast. Patient needs capsules, does not tolerate the capsules (Patient taking differently: Take 75 mg by mouth 2 (two) times daily. Patient needs capsules, does not tolerate the capsules) 90 capsule 3  . VENTOLIN HFA 108 (90 Base) MCG/ACT inhaler INHALE 2 PUFFS INTO THE LUNGS EVERY 4 HOURS AS NEEDED FOR WHEEZING OR SHORTNESS OF BREATH. 18 g 6  . fenofibrate micronized (ANTARA) 130 MG capsule Take 1 capsule (130 mg total) by mouth daily before breakfast. 30 capsule 1  . Vitamin D, Ergocalciferol, (DRISDOL) 50000 UNITS CAPS capsule Take 1 capsule (50,000 Units total) by mouth every 7 (seven) days. 12 capsule 1  . FLUoxetine (PROZAC) 10 MG capsule Take 2 capsules (20 mg total) by mouth daily. 60 capsule 3   No facility-administered medications prior to visit.     Allergies  Allergen Reactions  . Losartan Cough  . Aspirin     REACTION: Upset stomach  . Atorvastatin     myalgias  . Codeine   . Amoxicillin Rash    Review of Systems  Constitutional: Positive for malaise/fatigue. Negative for fever.  HENT: Negative for  congestion.   Eyes: Negative for blurred vision.  Respiratory: Negative for shortness of breath.   Cardiovascular: Negative for chest pain, palpitations and leg swelling.  Gastrointestinal: Negative for abdominal pain, blood in stool and nausea.  Genitourinary: Negative for dysuria and frequency.  Musculoskeletal: Positive for joint pain. Negative for falls.  Skin: Negative for rash.  Neurological: Negative for dizziness, loss of consciousness  and headaches.  Endo/Heme/Allergies: Negative for environmental allergies.  Psychiatric/Behavioral: Negative for depression. The patient is nervous/anxious.        Objective:    Physical Exam  Constitutional: She is oriented to person, place, and time. She appears well-developed and well-nourished. No distress.  HENT:  Head: Normocephalic and atraumatic.  Nose: Nose normal.  Eyes: Right eye exhibits no discharge. Left eye exhibits no discharge.  Neck: Normal range of motion. Neck supple.  Cardiovascular: Normal rate and regular rhythm.   No murmur heard. Pulmonary/Chest: Effort normal and breath sounds normal.  Abdominal: Soft. Bowel sounds are normal. There is no tenderness.  Musculoskeletal: She exhibits no edema.  Neurological: She is alert and oriented to person, place, and time.  Skin: Skin is warm and dry.  Psychiatric: She has a normal mood and affect.  Nursing note and vitals reviewed.   BP 120/80 (BP Location: Right Arm, Patient Position: Sitting, Cuff Size: Large)   Pulse 68   Temp 98.2 F (36.8 C) (Oral)   Ht 5\' 5"  (1.651 m)   Wt 253 lb (114.8 kg)   BMI 42.10 kg/m  Wt Readings from Last 3 Encounters:  11/22/15 253 lb (114.8 kg)  08/20/15 256 lb 9.6 oz (116.4 kg)  08/02/15 253 lb (114.8 kg)     Lab Results  Component Value Date   WBC 4.1 09/24/2015   HGB 12.9 09/24/2015   HCT 38.3 09/24/2015   PLT 338.0 09/24/2015   GLUCOSE 122 (H) 09/24/2015   CHOL 224 (H) 09/24/2015   TRIG 176.0 (H) 09/24/2015   HDL 36.90  (L) 09/24/2015   LDLDIRECT 160.8 01/30/2014   LDLCALC 152 (H) 09/24/2015   ALT 27 09/24/2015   AST 27 09/24/2015   NA 140 09/24/2015   K 3.8 09/24/2015   CL 107 09/24/2015   CREATININE 0.93 09/24/2015   BUN 9 09/24/2015   CO2 23 09/24/2015   TSH 0.84 09/24/2015   HGBA1C 6.3 09/24/2015   MICROALBUR <0.7 09/04/2014    Lab Results  Component Value Date   TSH 0.84 09/24/2015   Lab Results  Component Value Date   WBC 4.1 09/24/2015   HGB 12.9 09/24/2015   HCT 38.3 09/24/2015   MCV 87.6 09/24/2015   PLT 338.0 09/24/2015   Lab Results  Component Value Date   NA 140 09/24/2015   K 3.8 09/24/2015   CO2 23 09/24/2015   GLUCOSE 122 (H) 09/24/2015   BUN 9 09/24/2015   CREATININE 0.93 09/24/2015   BILITOT 0.3 09/24/2015   ALKPHOS 62 09/24/2015   AST 27 09/24/2015   ALT 27 09/24/2015   PROT 6.8 09/24/2015   ALBUMIN 4.2 09/24/2015   CALCIUM 9.4 09/24/2015   GFR 80.55 09/24/2015   Lab Results  Component Value Date   CHOL 224 (H) 09/24/2015   Lab Results  Component Value Date   HDL 36.90 (L) 09/24/2015   Lab Results  Component Value Date   LDLCALC 152 (H) 09/24/2015   Lab Results  Component Value Date   TRIG 176.0 (H) 09/24/2015   Lab Results  Component Value Date   CHOLHDL 6 09/24/2015   Lab Results  Component Value Date   HGBA1C 6.3 09/24/2015       Assessment & Plan:   Problem List Items Addressed This Visit    Hypothyroidism    On Levothyroxine, continue to monitor      Hyperlipemia, mixed    Encouraged heart healthy diet, increase exercise, avoid trans fats, consider a krill  oil cap daily      Relevant Medications   fenofibrate micronized (ANTARA) 130 MG capsule   Other Relevant Orders   Lipid panel   Obstructive sleep apnea   Essential hypertension - Primary    Well controlled, no changes to meds. Encouraged heart healthy diet such as the DASH diet and exercise as tolerated.       Relevant Medications   fenofibrate micronized (ANTARA)  130 MG capsule   Other Relevant Orders   CBC   TSH   Comprehensive metabolic panel   Obesity    Encouraged DASH diet, decrease po intake and increase exercise as tolerated. Needs 7-8 hours of sleep nightly. Avoid trans fats, eat small, frequent meals every 4-5 hours with lean proteins, complex carbs and healthy fats. Minimize simple carbs      GERD    Avoid offending foods, take probiotics. Do not eat large meals in late evening and consider raising head of bed.       Vitamin D deficiency   Relevant Orders   VITAMIN D 25 Hydroxy (Vit-D Deficiency, Fractures)   Diabetes mellitus type 2 in obese (HCC)    hgba1c acceptable, minimize simple carbs. Increase exercise as tolerated. Continue current meds       Other Visit Diagnoses   None.     I am having Ms. Manetta maintain her clotrimazole, latanoprost, TRUEPLUS LANCETS 30G, glucose blood, hyoscyamine, spironolactone, Spacer/Aero-Holding Chambers, ipratropium, ALPRAZolam, venlafaxine XR, gabapentin, furosemide, Nebivolol HCl, gabapentin, FLUoxetine, metFORMIN, VENTOLIN HFA, amLODipine, SYNTHROID, fenofibrate micronized, and Vitamin D (Ergocalciferol).  Meds ordered this encounter  Medications  . fenofibrate micronized (ANTARA) 130 MG capsule    Sig: Take 1 capsule (130 mg total) by mouth daily before breakfast.    Dispense:  30 capsule    Refill:  6  . Vitamin D, Ergocalciferol, (DRISDOL) 50000 units CAPS capsule    Sig: Take 1 capsule (50,000 Units total) by mouth every 7 (seven) days.    Dispense:  12 capsule    Refill:  1     Safira Proffit, MD

## 2015-12-01 NOTE — Assessment & Plan Note (Signed)
Well controlled, no changes to meds. Encouraged heart healthy diet such as the DASH diet and exercise as tolerated.  °

## 2015-12-03 DIAGNOSIS — M7542 Impingement syndrome of left shoulder: Secondary | ICD-10-CM | POA: Diagnosis not present

## 2015-12-06 DIAGNOSIS — M7542 Impingement syndrome of left shoulder: Secondary | ICD-10-CM | POA: Diagnosis not present

## 2015-12-09 DIAGNOSIS — Z760 Encounter for issue of repeat prescription: Secondary | ICD-10-CM | POA: Diagnosis not present

## 2015-12-10 DIAGNOSIS — M7542 Impingement syndrome of left shoulder: Secondary | ICD-10-CM | POA: Diagnosis not present

## 2015-12-13 DIAGNOSIS — M7542 Impingement syndrome of left shoulder: Secondary | ICD-10-CM | POA: Diagnosis not present

## 2015-12-17 DIAGNOSIS — M7542 Impingement syndrome of left shoulder: Secondary | ICD-10-CM | POA: Diagnosis not present

## 2015-12-20 DIAGNOSIS — M7542 Impingement syndrome of left shoulder: Secondary | ICD-10-CM | POA: Diagnosis not present

## 2015-12-24 DIAGNOSIS — M7542 Impingement syndrome of left shoulder: Secondary | ICD-10-CM | POA: Diagnosis not present

## 2015-12-28 ENCOUNTER — Institutional Professional Consult (permissible substitution): Payer: Self-pay | Admitting: Pulmonary Disease

## 2015-12-28 DIAGNOSIS — M7542 Impingement syndrome of left shoulder: Secondary | ICD-10-CM | POA: Diagnosis not present

## 2015-12-31 DIAGNOSIS — M7542 Impingement syndrome of left shoulder: Secondary | ICD-10-CM | POA: Diagnosis not present

## 2015-12-31 MED FILL — FENOFIBRATE 130 MG CAPSULE: 130 | 90 days supply | Qty: 90 | Fill #1

## 2015-12-31 MED FILL — AMLODIPINE BESYLATE 5 MG TA: 5 | 30 days supply | Qty: 30 | Fill #3

## 2015-12-31 MED FILL — FLUoxetine HCL 10 MG CAPS: 10 | 30 days supply | Qty: 90 | Fill #1

## 2016-01-03 ENCOUNTER — Other Ambulatory Visit (INDEPENDENT_AMBULATORY_CARE_PROVIDER_SITE_OTHER): Payer: 59

## 2016-01-03 DIAGNOSIS — E782 Mixed hyperlipidemia: Secondary | ICD-10-CM | POA: Diagnosis not present

## 2016-01-03 DIAGNOSIS — M609 Myositis, unspecified: Secondary | ICD-10-CM | POA: Diagnosis not present

## 2016-01-03 DIAGNOSIS — E559 Vitamin D deficiency, unspecified: Secondary | ICD-10-CM

## 2016-01-03 DIAGNOSIS — I10 Essential (primary) hypertension: Secondary | ICD-10-CM | POA: Diagnosis not present

## 2016-01-03 DIAGNOSIS — IMO0001 Reserved for inherently not codable concepts without codable children: Secondary | ICD-10-CM

## 2016-01-03 DIAGNOSIS — M791 Myalgia: Secondary | ICD-10-CM

## 2016-01-03 DIAGNOSIS — M7542 Impingement syndrome of left shoulder: Secondary | ICD-10-CM | POA: Diagnosis not present

## 2016-01-03 LAB — VITAMIN D 25 HYDROXY (VIT D DEFICIENCY, FRACTURES): VITD: 36.05 ng/mL (ref 30.00–100.00)

## 2016-01-03 LAB — CBC
HCT: 37.4 % (ref 36.0–46.0)
Hemoglobin: 12.8 g/dL (ref 12.0–15.0)
MCHC: 34.2 g/dL (ref 30.0–36.0)
MCV: 85.9 fl (ref 78.0–100.0)
Platelets: 345 10*3/uL (ref 150.0–400.0)
RBC: 4.35 Mil/uL (ref 3.87–5.11)
RDW: 14.9 % (ref 11.5–15.5)
WBC: 5.2 10*3/uL (ref 4.0–10.5)

## 2016-01-03 LAB — COMPREHENSIVE METABOLIC PANEL
ALT: 32 U/L (ref 0–35)
AST: 23 U/L (ref 0–37)
Albumin: 4.2 g/dL (ref 3.5–5.2)
Alkaline Phosphatase: 47 U/L (ref 39–117)
BUN: 9 mg/dL (ref 6–23)
CO2: 23 mEq/L (ref 19–32)
Calcium: 9.6 mg/dL (ref 8.4–10.5)
Chloride: 109 mEq/L (ref 96–112)
Creatinine, Ser: 0.94 mg/dL (ref 0.40–1.20)
GFR: 79.48 mL/min (ref >60.00)
Glucose, Bld: 98 mg/dL (ref 70–99)
Potassium: 3.9 mEq/L (ref 3.5–5.1)
Sodium: 138 mEq/L (ref 135–145)
Total Bilirubin: 0.2 mg/dL (ref 0.2–1.2)
Total Protein: 6.9 g/dL (ref 6.0–8.3)

## 2016-01-03 LAB — CK: Total CK: 304 U/L — ABNORMAL HIGH (ref 7–177)

## 2016-01-03 LAB — TSH: TSH: 0.84 u[IU]/mL (ref 0.35–4.50)

## 2016-01-03 LAB — LIPID PANEL
Cholesterol: 202 mg/dL — ABNORMAL HIGH (ref 0–200)
HDL: 42.3 mg/dL (ref >39.00)
LDL Cholesterol: 131 mg/dL — ABNORMAL HIGH (ref 0–99)
NonHDL: 159.97
Total CHOL/HDL Ratio: 5
Triglycerides: 143 mg/dL (ref 0.0–149.0)
VLDL: 28.6 mg/dL (ref 0.0–40.0)

## 2016-01-07 DIAGNOSIS — M7542 Impingement syndrome of left shoulder: Secondary | ICD-10-CM | POA: Diagnosis not present

## 2016-01-10 ENCOUNTER — Ambulatory Visit (INDEPENDENT_AMBULATORY_CARE_PROVIDER_SITE_OTHER): Payer: 59 | Admitting: Family Medicine

## 2016-01-10 ENCOUNTER — Encounter: Payer: Self-pay | Admitting: Family Medicine

## 2016-01-10 VITALS — BP 120/68 | HR 80 | Temp 98.7°F | Wt 262.8 lb

## 2016-01-10 DIAGNOSIS — Z23 Encounter for immunization: Secondary | ICD-10-CM | POA: Diagnosis not present

## 2016-01-10 DIAGNOSIS — I10 Essential (primary) hypertension: Secondary | ICD-10-CM

## 2016-01-10 DIAGNOSIS — E039 Hypothyroidism, unspecified: Secondary | ICD-10-CM

## 2016-01-10 DIAGNOSIS — E559 Vitamin D deficiency, unspecified: Secondary | ICD-10-CM | POA: Diagnosis not present

## 2016-01-10 DIAGNOSIS — E119 Type 2 diabetes mellitus without complications: Secondary | ICD-10-CM

## 2016-01-10 DIAGNOSIS — R5383 Other fatigue: Secondary | ICD-10-CM | POA: Diagnosis not present

## 2016-01-10 DIAGNOSIS — E782 Mixed hyperlipidemia: Secondary | ICD-10-CM

## 2016-01-10 DIAGNOSIS — E118 Type 2 diabetes mellitus with unspecified complications: Secondary | ICD-10-CM

## 2016-01-10 DIAGNOSIS — R0600 Dyspnea, unspecified: Secondary | ICD-10-CM

## 2016-01-10 DIAGNOSIS — E1169 Type 2 diabetes mellitus with other specified complication: Secondary | ICD-10-CM

## 2016-01-10 DIAGNOSIS — E669 Obesity, unspecified: Secondary | ICD-10-CM

## 2016-01-10 NOTE — Patient Instructions (Signed)

## 2016-01-10 NOTE — Progress Notes (Signed)
Pre visit review using our clinic review tool, if applicable. No additional management support is needed unless otherwise documented below in the visit note. 

## 2016-01-11 ENCOUNTER — Other Ambulatory Visit: Payer: Self-pay | Admitting: Family Medicine

## 2016-01-11 DIAGNOSIS — M7542 Impingement syndrome of left shoulder: Secondary | ICD-10-CM | POA: Diagnosis not present

## 2016-01-11 MED FILL — FUROSEMIDE 20 MG TABLET: 20 | 20 days supply | Qty: 40 | Fill #5

## 2016-01-11 MED FILL — VENLAFAXINE HCL ER 75 MG CA: 75 | 30 days supply | Qty: 90 | Fill #0

## 2016-01-14 DIAGNOSIS — M7542 Impingement syndrome of left shoulder: Secondary | ICD-10-CM | POA: Diagnosis not present

## 2016-01-16 NOTE — Assessment & Plan Note (Signed)
On Levothyroxine, continue to monitor 

## 2016-01-16 NOTE — Assessment & Plan Note (Signed)
Likely related to weight and deconditioning but since worsening and because of risk factors will chek echo.

## 2016-01-16 NOTE — Assessment & Plan Note (Signed)
minimize simple carbs. Increase exercise as tolerated. Continue current meds  

## 2016-01-16 NOTE — Assessment & Plan Note (Signed)
Level within normal limits. Continue supplements.

## 2016-01-16 NOTE — Assessment & Plan Note (Signed)
Well controlled, no changes to meds. Encouraged heart healthy diet such as the DASH diet and exercise as tolerated.  °

## 2016-01-16 NOTE — Assessment & Plan Note (Signed)
Encouraged DASH diet, decrease po intake and increase exercise as tolerated. Needs 7-8 hours of sleep nightly. Avoid trans fats, eat small, frequent meals every 4-5 hours with lean proteins, complex carbs and healthy fats. Minimize simple carbs, GMO foods. encouraged to consider a bariatrics referral

## 2016-01-16 NOTE — Progress Notes (Signed)
Patient ID: [name], female   DOB: 1960-10-05, 55 y.o.   MRN: [id number]   Subjective:    Patient ID: [name], female    DOB: April 19, 1961, 55 y.o.   MRN: [id number]  Chief Complaint  Patient presents with  . Follow-up    HPI Patient is in today for follow up. She is struggling with a sense of worsening dyspnea on exertion. No associated symptoms such as chest pain or palpitations. Does note some fatigue and jaw pain at times. Denies CP/palp/SOB/HA/congestion/fevers/GI or GU c/o. Taking meds as prescribed  Past Medical History:  Diagnosis Date  . Anxiety   . Chronic pain syndrome 01/06/2013  . Chronic rhinosinusitis   . Colon polyps   . Cough 01/06/2013  . Depression   . Diabetes ([place]) 01/06/2013  . Diabetes mellitus type 2 in obese [facility]) 01/06/2013   Sees Dr Calvert Cantor for eye exam Does not see podiatry, foot exam today unremarkable except for thick cracking skin on heals   . Edema 01/06/2013  . Fibroid, uterine   . GERD (gastroesophageal reflux disease)   . Glaucoma 12-13  . Hoarseness   . Hoarseness of voice 05/11/2013  . Hyperglycemia 04/13/2013  . Hyperlipemia, mixed 06/06/2007   Qualifier: Diagnosis of  By: Wynona Luna She feels Lipitor caused increased low back pain and weakness    . Hyperlipidemia   . Hypertension   . Hypokalemia 02/05/2013  . IBS (irritable bowel syndrome) 02/13/2011  . Low back pain 03/03/2013  . Muscle cramp 05/22/2014  . Obesity   . Obesity, unspecified 05/11/2013  . OSA (obstructive sleep apnea)   . Pain in joint, shoulder region 06/27/2015  . Perimenopause 10/05/2012  . Raynaud disease 06/16/2013  . Thyroid disease    hypothyroidism    Past Surgical History:  Procedure Laterality Date  . ABDOMINAL HYSTERECTOMY    . CHOLECYSTECTOMY      Family History  Problem Relation Age of Onset  . Alcohol abuse Father   . Cancer Father     renal and colon  . Hyperlipidemia Father   . Hypertension Father   . Cancer Paternal  Grandfather     colon  . Diabetes      Social History   Social History  . Marital status: Single    Spouse name: N/A  . Number of children: N/A  . Years of education: N/A   Occupational History  . Not on file.   Social History Main Topics  . Smoking status: Former Smoker    Packs/day: 0.50    Years: 30.00    Types: Cigarettes    Quit date: 04/25/2003  . Smokeless tobacco: Never Used     Comment: smoked since age 60  . Alcohol use No  . Drug use: Unknown  . Sexual activity: Not on file   Other Topics Concern  . Not on file   Social History Narrative   Endo-- Dr Dwyane Dee   ENT--Dr shoemaker   GI--Dr Buccini   Pulm--Dr Clance   Rheum--Dr Charlestine Night          Outpatient Medications Prior to Visit  Medication Sig Dispense Refill  . amLODipine (NORVASC) 5 MG tablet Take 1 tablet (5 mg total) by mouth daily. 30 tablet 3  . fenofibrate micronized (ANTARA) 130 MG capsule Take 1 capsule (130 mg total) by mouth daily before breakfast. 30 capsule 6  . FLUoxetine (PROZAC) 10 MG capsule TAKE 3 CAPSULES BY MOUTH DAILY. (Patient taking differently: take  1 capsule daily) 90 capsule PRN  . furosemide (LASIX) 20 MG tablet TAKE 1-2 TABLETS BY MOUTH DAILY AS NEEDED FOR FLUID OR EDEMA 40 tablet 6  . gabapentin (NEURONTIN) 100 MG capsule Take 1 capsule (100 mg total) by mouth 2 (two) times daily. During the day 60 capsule 3  . gabapentin (NEURONTIN) 400 MG capsule Take 2 capsules (800 mg total) by mouth at bedtime. 180 capsule 1  . hyoscyamine (LEVSIN, ANASPAZ) 0.125 MG tablet Take 1 tablet (0.125 mg total) by mouth every 4 (four) hours as needed for cramping. 40 tablet 1  . ipratropium (ATROVENT) 0.06 % nasal spray Place 2 sprays into both nostrils 4 (four) times daily. 15 mL 12  . metFORMIN (GLUCOPHAGE-XR) 500 MG 24 hr tablet TAKE 2 TABLETS BY MOUTH DAILY WITH BREAKFAST. 60 tablet PRN  . Nebivolol HCl (BYSTOLIC) 20 MG TABS Take 1 tablet (20 mg total) by mouth daily. 90 tablet 2  .  Spacer/Aero-Holding Dorise Bullion Use as directed 1 each 1  . spironolactone (ALDACTONE) 25 MG tablet Take 1 tablet (25 mg total) by mouth 2 (two) times daily. 60 tablet 1  . SYNTHROID 100 MCG tablet TAKE 1 TABLET BY MOUTH DAILY BEFORE BREAKFAST 30 tablet 5  . TRUEPLUS LANCETS 30G MISC 1 Device by Does not apply route daily as needed. DX 250.00 by Does not apply route daily as needed. DX 250.00 100 each 2  . VENTOLIN HFA 108 (90 Base) MCG/ACT inhaler INHALE 2 PUFFS INTO THE LUNGS EVERY 4 HOURS AS NEEDED FOR WHEEZING OR SHORTNESS OF BREATH. 18 g 6  . Vitamin D, Ergocalciferol, (DRISDOL) 50000 units CAPS capsule Take 1 capsule (50,000 Units total) by mouth every 7 (seven) days. 12 capsule 1  . ALPRAZolam (XANAX) 0.25 MG tablet TAKE 1/2 TABLET BY MOUTH 2 TIMES A DAY FOR 14 DAYS, THEN 1/2 TAB DAILY FOR 7 DAYS, THE 1/2 - 1 TAB DAILY AS NEEDED 40 tablet 1  . clotrimazole (LOTRIMIN) 1 % cream Apply topically 2 (two) times daily. (Patient not taking: Reported on 01/10/2016) 60 g 1  . glucose blood (TRUE CARE TEST STRIP PACK) test strip Use as instructed 100 each 3  . latanoprost (XALATAN) 0.005 % ophthalmic solution Place 1 drop into both eyes at bedtime.    Marland Kitchen venlafaxine XR (EFFEXOR XR) 75 MG 24 hr capsule Take 3 capsules (225 mg total) by mouth daily with breakfast. Patient needs capsules, does not tolerate the capsules (Patient taking differently: Take 75 mg by mouth 2 (two) times daily. Patient needs capsules, does not tolerate the capsules) 90 capsule 3   No facility-administered medications prior to visit.     Allergies  Allergen Reactions  . Losartan Cough  . Aspirin     REACTION: Upset stomach  . Atorvastatin     myalgias  . Codeine   . Amoxicillin Rash  . Erythromycin Rash    Review of Systems  Constitutional: Positive for malaise/fatigue. Negative for fever.  HENT: Negative for congestion.   Eyes: Negative for blurred vision.  Respiratory: Negative for wheezing.   Cardiovascular:  Negative for chest pain, palpitations and leg swelling.  Gastrointestinal: Negative for abdominal pain, blood in stool and nausea.  Genitourinary: Negative for dysuria and frequency.  Musculoskeletal: Positive for joint pain. Negative for falls.  Skin: Negative for rash.  Neurological: Negative for dizziness, loss of consciousness and headaches.  Endo/Heme/Allergies: Negative for environmental allergies.  Psychiatric/Behavioral: Negative for depression. The patient is not nervous/anxious.  Objective:    Physical Exam  Constitutional: She is oriented to person, place, and time. She appears well-developed and well-nourished. No distress.  HENT:  Head: Normocephalic and atraumatic.  Nose: Nose normal.  Eyes: Right eye exhibits no discharge. Left eye exhibits no discharge.  Neck: Normal range of motion. Neck supple.  Cardiovascular: Normal rate and regular rhythm.   No murmur heard. Pulmonary/Chest: Effort normal and breath sounds normal.  Abdominal: Soft. Bowel sounds are normal. There is no tenderness.  Musculoskeletal: She exhibits no edema.  Neurological: She is alert and oriented to person, place, and time.  Skin: Skin is warm and dry.  Psychiatric: She has a normal mood and affect.  Nursing note and vitals reviewed.   BP 120/68 (BP Location: Right Arm, Patient Position: Sitting, Cuff Size: Large)   Pulse 80   Temp 98.7 F (37.1 C) (Oral)   Wt 262 lb 12.8 oz (119.2 kg)   SpO2 95%   BMI 43.73 kg/m  Wt Readings from Last 3 Encounters:  01/10/16 262 lb 12.8 oz (119.2 kg)  11/22/15 253 lb (114.8 kg)  08/20/15 256 lb 9.6 oz (116.4 kg)     Lab Results  Component Value Date   WBC 5.2 01/03/2016   HGB 12.8 01/03/2016   HCT 37.4 01/03/2016   PLT 345.0 01/03/2016   GLUCOSE 98 01/03/2016   CHOL 202 (H) 01/03/2016   TRIG 143.0 01/03/2016   HDL 42.30 01/03/2016   LDLDIRECT 160.8 01/30/2014   LDLCALC 131 (H) 01/03/2016   ALT 32 01/03/2016   AST 23 01/03/2016   NA  138 01/03/2016   K 3.9 01/03/2016   CL 109 01/03/2016   CREATININE 0.94 01/03/2016   BUN 9 01/03/2016   CO2 23 01/03/2016   TSH 0.84 01/03/2016   HGBA1C 6.3 09/24/2015   MICROALBUR <0.7 09/04/2014    Lab Results  Component Value Date   TSH 0.84 01/03/2016   Lab Results  Component Value Date   WBC 5.2 01/03/2016   HGB 12.8 01/03/2016   HCT 37.4 01/03/2016   MCV 85.9 01/03/2016   PLT 345.0 01/03/2016   Lab Results  Component Value Date   NA 138 01/03/2016   K 3.9 01/03/2016   CO2 23 01/03/2016   GLUCOSE 98 01/03/2016   BUN 9 01/03/2016   CREATININE 0.94 01/03/2016   BILITOT 0.2 01/03/2016   ALKPHOS 47 01/03/2016   AST 23 01/03/2016   ALT 32 01/03/2016   PROT 6.9 01/03/2016   ALBUMIN 4.2 01/03/2016   CALCIUM 9.6 01/03/2016   GFR 79.48 01/03/2016   Lab Results  Component Value Date   CHOL 202 (H) 01/03/2016   Lab Results  Component Value Date   HDL 42.30 01/03/2016   Lab Results  Component Value Date   LDLCALC 131 (H) 01/03/2016   Lab Results  Component Value Date   TRIG 143.0 01/03/2016   Lab Results  Component Value Date   CHOLHDL 5 01/03/2016   Lab Results  Component Value Date   HGBA1C 6.3 09/24/2015       Assessment & Plan:   Problem List Items Addressed This Visit    Hypothyroidism    On Levothyroxine, continue to monitor      Relevant Orders   TSH   Hyperlipemia, mixed   Relevant Orders   Lipid panel   Essential hypertension    Well controlled, no changes to meds. Encouraged heart healthy diet such as the DASH diet and exercise as tolerated.  Obesity    Encouraged DASH diet, decrease po intake and increase exercise as tolerated. Needs 7-8 hours of sleep nightly. Avoid trans fats, eat small, frequent meals every 4-5 hours with lean proteins, complex carbs and healthy fats. Minimize simple carbs, GMO foods. encouraged to consider a bariatrics referral      Vitamin D deficiency    Level within normal limits. Continue  supplements.      Relevant Orders   Vitamin D (25 hydroxy)   Diabetes mellitus type 2 in obese (HCC)    minimize simple carbs. Increase exercise as tolerated. Continue current meds      Dyspnea - Primary    Likely related to weight and deconditioning but since worsening and because of risk factors will chek echo.      Relevant Orders   ECHOCARDIOGRAM COMPLETE   CBC   Comprehensive metabolic panel   Diabetes (HCC)   Relevant Orders   Hemoglobin A1c   Comprehensive metabolic panel   Hemoglobin A1c   Microalbumin / creatinine urine ratio    Other Visit Diagnoses    Encounter for immunization       Relevant Orders   Flu Vaccine QUAD 36+ mos IM (Completed)   ECHOCARDIOGRAM COMPLETE   Other fatigue       Relevant Orders   ECHOCARDIOGRAM COMPLETE      I have discontinued Ms. Nagasawa clotrimazole, latanoprost, glucose blood, and ALPRAZolam. I am also having her maintain her TRUEPLUS LANCETS 30G, hyoscyamine, spironolactone, Spacer/Aero-Holding Chambers, ipratropium, gabapentin, furosemide, Nebivolol HCl, gabapentin, FLUoxetine, metFORMIN, VENTOLIN HFA, amLODipine, SYNTHROID, fenofibrate micronized, and Vitamin D (Ergocalciferol).  No orders of the defined types were placed in this encounter.    Penni Homans, MD

## 2016-01-19 ENCOUNTER — Other Ambulatory Visit (HOSPITAL_BASED_OUTPATIENT_CLINIC_OR_DEPARTMENT_OTHER): Payer: Self-pay

## 2016-01-21 ENCOUNTER — Ambulatory Visit (HOSPITAL_COMMUNITY)
Admission: RE | Admit: 2016-01-21 | Discharge: 2016-01-21 | Disposition: A | Discharge status: Home / Self Care | Payer: 59 | Source: Ambulatory Visit | Attending: Family Medicine | Admitting: Family Medicine

## 2016-01-21 DIAGNOSIS — E039 Hypothyroidism, unspecified: Secondary | ICD-10-CM | POA: Insufficient documentation

## 2016-01-21 DIAGNOSIS — R0600 Dyspnea, unspecified: Secondary | ICD-10-CM

## 2016-01-21 DIAGNOSIS — E559 Vitamin D deficiency, unspecified: Secondary | ICD-10-CM | POA: Insufficient documentation

## 2016-01-21 DIAGNOSIS — R5383 Other fatigue: Secondary | ICD-10-CM | POA: Diagnosis not present

## 2016-01-21 DIAGNOSIS — E669 Obesity, unspecified: Secondary | ICD-10-CM | POA: Insufficient documentation

## 2016-01-21 DIAGNOSIS — Z6841 Body Mass Index (BMI) 40.0 and over, adult: Secondary | ICD-10-CM | POA: Diagnosis not present

## 2016-01-21 DIAGNOSIS — E785 Hyperlipidemia, unspecified: Secondary | ICD-10-CM | POA: Insufficient documentation

## 2016-01-21 DIAGNOSIS — Z23 Encounter for immunization: Secondary | ICD-10-CM | POA: Diagnosis not present

## 2016-01-21 DIAGNOSIS — I119 Hypertensive heart disease without heart failure: Secondary | ICD-10-CM | POA: Diagnosis not present

## 2016-01-21 DIAGNOSIS — E119 Type 2 diabetes mellitus without complications: Secondary | ICD-10-CM | POA: Insufficient documentation

## 2016-01-21 LAB — ECHOCARDIOGRAM COMPLETE
E decel time: 187 msec
E/e' ratio: 9.55
FS: 32 % (ref 28–44)
IVS/LV PW RATIO, ED: 1.01
LA ID, A-P, ES: 44 mm
LA diam end sys: 44 mm
LA diam index: 1.83 cm/m2
LA vol A4C: 40.5 ml
LV E/e' medial: 9.55
LV E/e'average: 9.55
LV PW d: 12.6 mm — AB (ref 0.6–1.1)
LV e' LATERAL: 9.14 cm/s
LVOT SV: 54 mL
LVOT VTI: 21.3 cm
LVOT area: 2.54 cm2
LVOT diameter: 18 mm
LVOT peak vel: 105 cm/s
MV Dec: 187
MV Peak grad: 3 mmHg
MV pk A vel: 95.1 m/s
MV pk E vel: 87.3 m/s
TAPSE: 15.3 mm
TDI e' lateral: 9.14
TDI e' medial: 9.25

## 2016-01-27 ENCOUNTER — Other Ambulatory Visit: Payer: Self-pay | Admitting: Family Medicine

## 2016-01-27 MED ORDER — SPIRONOLACTONE 25 MG PO TABS: 25.0000 mg | ORAL_TABLET | Freq: Two times a day (BID) | ORAL | 6 refills | Status: DC

## 2016-01-27 MED FILL — SPIRONOLACTONE 25 MG TABLET: 25 | 30 days supply | Qty: 60 | Fill #0

## 2016-01-27 MED FILL — SYNTHROID 100 MCG TABLET: 100 | 60 days supply | Qty: 60 | Fill #2

## 2016-02-07 MED FILL — BYSTOLIC 20 MG TABLET: 20 | 90 days supply | Qty: 90 | Fill #2

## 2016-02-07 MED FILL — METFORMIN HCL ER 500 MG TAB: 500 | 30 days supply | Qty: 60 | Fill #1

## 2016-02-07 MED FILL — VIT D2 1.25 MG (50,000 UNIT: 1.25 MG | 84 days supply | Qty: 12 | Fill #1

## 2016-02-08 ENCOUNTER — Encounter: Payer: Self-pay | Admitting: Family Medicine

## 2016-02-08 MED ORDER — AMLODIPINE BESYLATE 5 MG PO TABS: 5.0000 mg | ORAL_TABLET | Freq: Every day | ORAL | 1 refills | Status: DC

## 2016-02-08 MED FILL — VENTOLIN HFA 90 MCG INHALER: 108 (90 BAS | 25 days supply | Qty: 18 | Fill #1

## 2016-02-08 MED FILL — AMLODIPINE BESYLATE 5 MG TA: 5 | 90 days supply | Qty: 90 | Fill #0

## 2016-02-11 DIAGNOSIS — M5136 Other intervertebral disc degeneration, lumbar region: Secondary | ICD-10-CM | POA: Diagnosis not present

## 2016-02-11 DIAGNOSIS — M9903 Segmental and somatic dysfunction of lumbar region: Secondary | ICD-10-CM | POA: Diagnosis not present

## 2016-02-11 DIAGNOSIS — M9905 Segmental and somatic dysfunction of pelvic region: Secondary | ICD-10-CM | POA: Diagnosis not present

## 2016-02-11 DIAGNOSIS — M9902 Segmental and somatic dysfunction of thoracic region: Secondary | ICD-10-CM | POA: Diagnosis not present

## 2016-02-14 DIAGNOSIS — M5136 Other intervertebral disc degeneration, lumbar region: Secondary | ICD-10-CM | POA: Diagnosis not present

## 2016-02-14 DIAGNOSIS — M9905 Segmental and somatic dysfunction of pelvic region: Secondary | ICD-10-CM | POA: Diagnosis not present

## 2016-02-14 DIAGNOSIS — M9902 Segmental and somatic dysfunction of thoracic region: Secondary | ICD-10-CM | POA: Diagnosis not present

## 2016-02-14 DIAGNOSIS — M9903 Segmental and somatic dysfunction of lumbar region: Secondary | ICD-10-CM | POA: Diagnosis not present

## 2016-02-17 DIAGNOSIS — M9902 Segmental and somatic dysfunction of thoracic region: Secondary | ICD-10-CM | POA: Diagnosis not present

## 2016-02-17 DIAGNOSIS — M9903 Segmental and somatic dysfunction of lumbar region: Secondary | ICD-10-CM | POA: Diagnosis not present

## 2016-02-17 DIAGNOSIS — M9905 Segmental and somatic dysfunction of pelvic region: Secondary | ICD-10-CM | POA: Diagnosis not present

## 2016-02-17 DIAGNOSIS — M5136 Other intervertebral disc degeneration, lumbar region: Secondary | ICD-10-CM | POA: Diagnosis not present

## 2016-02-18 ENCOUNTER — Encounter: Payer: Self-pay | Admitting: Pulmonary Disease

## 2016-02-18 ENCOUNTER — Ambulatory Visit (INDEPENDENT_AMBULATORY_CARE_PROVIDER_SITE_OTHER): Payer: 59 | Admitting: Pulmonary Disease

## 2016-02-18 VITALS — BP 114/86 | HR 71 | Ht 65.0 in | Wt 262.8 lb

## 2016-02-18 DIAGNOSIS — R0600 Dyspnea, unspecified: Secondary | ICD-10-CM | POA: Diagnosis not present

## 2016-02-18 DIAGNOSIS — G4733 Obstructive sleep apnea (adult) (pediatric): Secondary | ICD-10-CM

## 2016-02-18 DIAGNOSIS — G4710 Hypersomnia, unspecified: Secondary | ICD-10-CM | POA: Insufficient documentation

## 2016-02-18 DIAGNOSIS — E669 Obesity, unspecified: Secondary | ICD-10-CM | POA: Insufficient documentation

## 2016-02-18 DIAGNOSIS — E662 Morbid (severe) obesity with alveolar hypoventilation: Secondary | ICD-10-CM | POA: Diagnosis not present

## 2016-02-18 DIAGNOSIS — M9903 Segmental and somatic dysfunction of lumbar region: Secondary | ICD-10-CM | POA: Diagnosis not present

## 2016-02-18 DIAGNOSIS — M9902 Segmental and somatic dysfunction of thoracic region: Secondary | ICD-10-CM | POA: Diagnosis not present

## 2016-02-18 DIAGNOSIS — G4730 Sleep apnea, unspecified: Secondary | ICD-10-CM

## 2016-02-18 DIAGNOSIS — M9905 Segmental and somatic dysfunction of pelvic region: Secondary | ICD-10-CM | POA: Diagnosis not present

## 2016-02-18 DIAGNOSIS — M5136 Other intervertebral disc degeneration, lumbar region: Secondary | ICD-10-CM | POA: Diagnosis not present

## 2016-02-18 NOTE — Assessment & Plan Note (Signed)
Patient was diagnosed with OSA in 2008. Had a lab sleep study. Prior to that, she had a sleep study in the 90s which was (-). Lab study in 2008 was (-).  She was symptomatic so she did a HST.  HST was (+) with AHI of 12. She was started on autocpap. She got a new cpap machine in 2013.   She had snoring, had witnessed apneas, gasping, choking. Had hypersomnia affecting her fxnality. Sx improved with cpap therapy. She has used cpap since 2008.   She still complains of sleepiness, tiredness despite using cpap. No snoring, witnessed apneas, gasping, choking.  She is worse off cpap. She sleeps 8 hrs/night. Wakes up unrefreshed. Hypersomnia affects fxnality, especially in pm.   She states she uses cpap every night.   Plan : We extensively discussed the importance of treating OSA and the need to use PAP therapy.   Continue with autocpap for now. Needs DL. Patient remains sleepy despite allegedly using CPAP. If the download is good, may need BiPaP or AVAPS if she has OSA. Plan for abg and PFT. May also need to adjust pressure.   Patient was instructed to have mask, tubings, filter, reservoir cleaned at least once a week with soapy water.  Patient was instructed to call the office if he/she is having issues with the PAP device.    I advised patient to obtain sufficient amount of sleep --  7 to 8 hours at least in a 24 hr period.  Patient was advised to follow good sleep hygiene.  Patient was advised NOT to engage in activities requiring concentration and/or vigilance if he/she is and  sleepy.  Patient is NOT to drive if he/she is sleepy.

## 2016-02-18 NOTE — Patient Instructions (Signed)
  It was a pleasure taking care of you today!  Continue using your CPAP machine.   Please make sure you use your CPAP device everytime you sleep.  We will monitor the usage of your machine per your insurance requirement.  Your insurance company may take the machine from you if you are not using it regularly.   Please clean the mask, tubings, filter, water reservoir with soapy water every week.  Please use distilled water for the water reservoir.   Please call the office or your machine provider (DME company) if you are having issues with the device.   We will schedule you for breathing test and abg.   Return to clinic in 4-6 weeks with Dr. Corrie Dandy or NP

## 2016-02-18 NOTE — Progress Notes (Signed)
Subjective:    Patient ID: [name], female    DOB: [birth year], 55 y.o.   MRN: [id number]  HPI  This is the case of [name], 55 y.o. Female, who was referred by Dr. Penni Homans  in consultation regarding OSA.   As you very well know, patient has a 15 PY smoking history, quit in 2005, not been diagnosed with asthma or copd.   Patient was diagnosed with OSA in 2008. Had a lab sleep study. Prior to that, she had a sleep study in the 90s which was (-). Lab study in 2008 was (-).  She was symptomatic so she did a HST.  HST was (+) with AHI of 12. She was started on autocpap. She got a new cpap machine in 2013.   She had snoring, had witnessed apneas, gasping, choking. Had hypersomnia affecting her fxnality. Sx improved with cpap therapy. She has used cpap since 2008.   She still complains of sleepiness, tiredness despite using cpap. No snoring, witnessed apneas, gasping, choking.  She is worse off cpap. She sleeps 8 hrs/night. Wakes up unrefreshed. Hypersomnia affects fxnality, especially in pm.   (-) abnormal behavior in sleep.  ESS 15.   She had surgery in June 2017.  She was  noted to have desaturation post op since she was not able to bring her machine.   Denies significant depression or anxiety.   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  It was started by [facility] in 2015.   Review of Systems  Constitutional: Negative.  Negative for fever and unexpected weight change.  HENT: Positive for congestion and sinus pressure. Negative for dental problem, ear pain, nosebleeds, postnasal drip, rhinorrhea, sneezing, sore throat and trouble swallowing.   Eyes: Negative.  Negative for redness and itching.  Respiratory: Positive for chest tightness and shortness of breath. Negative for cough and wheezing.   Cardiovascular: Positive for leg swelling. Negative for palpitations.  Gastrointestinal: Negative.  Negative for nausea and  vomiting.  Endocrine: Negative.   Genitourinary: Negative.  Negative for dysuria.  Musculoskeletal: Positive for joint swelling.  Skin: Negative.  Negative for rash.  Allergic/Immunologic: Positive for environmental allergies.  Neurological: Positive for dizziness. Negative for headaches.  Hematological: Bruises/bleeds easily.  Psychiatric/Behavioral: Negative.  Negative for dysphoric mood. The patient is not nervous/anxious.    Past Medical History:  Diagnosis Date  . Anxiety   . Chronic pain syndrome 01/06/2013  . Chronic rhinosinusitis   . Colon polyps   . Cough 01/06/2013  . Depression   . Diabetes ([place]) 01/06/2013  . Diabetes mellitus type 2 in obese [facility]) 01/06/2013   Sees Dr Calvert Cantor for eye exam Does not see podiatry, foot exam today unremarkable except for thick cracking skin on heals   . Edema 01/06/2013  . Fibroid, uterine   . GERD (gastroesophageal reflux disease)   . Glaucoma 12-13  . Hoarseness   . Hoarseness of voice 05/11/2013  . Hyperglycemia 04/13/2013  . Hyperlipemia, mixed 06/06/2007   Qualifier: Diagnosis of  By: Wynona Luna She feels Lipitor caused increased low back pain and weakness    . Hyperlipidemia   . Hypertension   . Hypokalemia 02/05/2013  . IBS (irritable bowel syndrome) 02/13/2011  . Low back pain 03/03/2013  . Muscle cramp 05/22/2014  . Obesity   . Obesity, unspecified 05/11/2013  . OSA (obstructive sleep apnea)   . Pain in joint,  shoulder region 06/27/2015  . Perimenopause 10/05/2012  . Raynaud disease 06/16/2013  . Thyroid disease    hypothyroidism   (-) CA, DVT  Family History  Problem Relation Age of Onset  . Alcohol abuse Father   . Cancer Father     renal and colon  . Hyperlipidemia Father   . Hypertension Father   . Cancer Paternal Grandfather     colon  . Diabetes       Past Surgical History:  Procedure Laterality Date  . ABDOMINAL HYSTERECTOMY    . CHOLECYSTECTOMY      Social History   Social History  . Marital  status: Single    Spouse name: N/A  . Number of children: N/A  . Years of education: N/A   Occupational History  . Not on file.   Social History Main Topics  . Smoking status: Former Smoker    Packs/day: 0.50    Years: 30.00    Types: Cigarettes    Quit date: 04/25/2003  . Smokeless tobacco: Never Used     Comment: smoked since age 23  . Alcohol use No  . Drug use: Unknown  . Sexual activity: Not on file   Other Topics Concern  . Not on file   Social History Narrative   Endo-- Dr Dwyane Dee   ENT--Dr shoemaker   GI--Dr Buccini   Pulm--Dr Clance   Rheum--Dr Charlestine Night         Single, has a partner, no children. 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 (-) ETOH.   Allergies  Allergen Reactions  . Losartan Cough  . Aspirin     REACTION: Upset stomach  . Atorvastatin     myalgias  . Codeine   . Amoxicillin Rash  . Erythromycin Rash     Outpatient Medications Prior to Visit  Medication Sig Dispense Refill  . amLODipine (NORVASC) 5 MG tablet Take 1 tablet (5 mg total) by mouth daily. 90 tablet 1  . fenofibrate micronized (ANTARA) 130 MG capsule Take 1 capsule (130 mg total) by mouth daily before breakfast. 30 capsule 6  . FLUoxetine (PROZAC) 10 MG capsule TAKE 3 CAPSULES BY MOUTH DAILY. (Patient taking differently: take 1 capsule daily) 90 capsule PRN  . furosemide (LASIX) 20 MG tablet TAKE 1-2 TABLETS BY MOUTH DAILY AS NEEDED FOR FLUID OR EDEMA 40 tablet 6  . gabapentin (NEURONTIN) 100 MG capsule Take 1 capsule (100 mg total) by mouth 2 (two) times daily. During the day 60 capsule 3  . gabapentin (NEURONTIN) 400 MG capsule Take 2 capsules (800 mg total) by mouth at bedtime. 180 capsule 1  . hyoscyamine (LEVSIN, ANASPAZ) 0.125 MG tablet Take 1 tablet (0.125 mg total) by mouth every 4 (four) hours as needed for cramping. 40 tablet 1  . ipratropium (ATROVENT) 0.06 % nasal spray Place 2 sprays into both nostrils 4 (four) times daily. 15 mL 12  . metFORMIN (GLUCOPHAGE-XR) 500 MG 24 hr tablet TAKE 2  TABLETS BY MOUTH DAILY WITH BREAKFAST. 60 tablet PRN  . Nebivolol HCl (BYSTOLIC) 20 MG TABS Take 1 tablet (20 mg total) by mouth daily. 90 tablet 2  . Spacer/Aero-Holding Dorise Bullion Use as directed 1 each 1  . spironolactone (ALDACTONE) 25 MG tablet Take 1 tablet (25 mg total) by mouth 2 (two) times daily. 60 tablet 6  . SYNTHROID 100 MCG tablet TAKE 1 TABLET BY MOUTH DAILY BEFORE BREAKFAST 30 tablet 5  . TRUEPLUS LANCETS 30G MISC 1 Device by Does not apply route  daily as needed. DX 250.00 by Does not apply route daily as needed. DX 250.00 100 each 2  . venlafaxine XR (EFFEXOR-XR) 75 MG 24 hr capsule TAKE 3 CAPSULES BY MOUTH ONCE DAILY WITH BREAKFAST. 90 capsule 3  . VENTOLIN HFA 108 (90 Base) MCG/ACT inhaler INHALE 2 PUFFS INTO THE LUNGS EVERY 4 HOURS AS NEEDED FOR WHEEZING OR SHORTNESS OF BREATH. 18 g 6  . Vitamin D, Ergocalciferol, (DRISDOL) 50000 units CAPS capsule Take 1 capsule (50,000 Units total) by mouth every 7 (seven) days. 12 capsule 1   No facility-administered medications prior to visit.    No orders of the defined types were placed in this encounter.       Objective:   Physical Exam  Vitals:  Vitals:   02/18/16 1018  BP: 114/86  Pulse: 71  SpO2: 96%  Weight: 262 lb 12.8 oz (119.2 kg)  Height: 5\' 5"  (1.651 m)    Constitutional/General:  Pleasant, well-nourished, well-developed, not in any distress,  Comfortably seating.  Well kempt  Body mass index is 43.73 kg/m. Wt Readings from Last 3 Encounters:  02/18/16 262 lb 12.8 oz (119.2 kg)  01/10/16 262 lb 12.8 oz (119.2 kg)  11/22/15 253 lb (114.8 kg)      HEENT: Pupils equal and reactive to light and accommodation. Anicteric sclerae. Normal nasal mucosa.   No oral  lesions,  mouth clear,  oropharynx clear, no postnasal drip. (-) Oral thrush. No dental caries.  Airway - Mallampati class IV  Neck: No masses. Midline trachea. No JVD, (-) LAD. (-) bruits appreciated.  Respiratory/Chest: Grossly normal chest.  (-) deformity. (-) Accessory muscle use.  Symmetric expansion. (-) Tenderness on palpation.  Resonant on percussion.  Diminished BS on both lower lung zones. (-) wheezing, crackles, rhonchi (-) egophony  Cardiovascular: Regular rate and  rhythm, heart sounds normal, no murmur or gallops, no peripheral edema  Gastrointestinal:  Normal bowel sounds. Soft, non-tender. No hepatosplenomegaly.  (-) masses.   Musculoskeletal:  Normal muscle tone. Normal gait.   Extremities: Grossly normal. (-) clubbing, cyanosis.  (-) edema  Skin: (-) rash,lesions seen.   Neurological/Psychiatric : alert, oriented to time, place, person. Normal mood and affect          Assessment & Plan:  OSA (obstructive sleep apnea) Patient was diagnosed with OSA in 2008. Had a lab sleep study. Prior to that, she had a sleep study in the 90s which was (-). Lab study in 2008 was (-).  She was symptomatic so she did a HST.  HST was (+) with AHI of 12. She was started on autocpap. She got a new cpap machine in 2013.   She had snoring, had witnessed apneas, gasping, choking. Had hypersomnia affecting her fxnality. Sx improved with cpap therapy. She has used cpap since 2008.   She still complains of sleepiness, tiredness despite using cpap. No snoring, witnessed apneas, gasping, choking.  She is worse off cpap. She sleeps 8 hrs/night. Wakes up unrefreshed. Hypersomnia affects fxnality, especially in pm.   She states she uses cpap every night.   Plan : We extensively discussed the importance of treating OSA and the need to use PAP therapy.   Continue with autocpap for now. Needs DL. Patient remains sleepy despite allegedly using CPAP. If the download is good, may need BiPaP or AVAPS if she has OSA. Plan for abg and PFT. May also need to adjust pressure.   Patient was instructed to have mask, tubings, filter, reservoir cleaned at least  once a week with soapy water.  Patient was instructed to call the office if he/she  is having issues with the PAP device.    I advised patient to obtain sufficient amount of sleep --  7 to 8 hours at least in a 24 hr period.  Patient was advised to follow good sleep hygiene.  Patient was advised NOT to engage in activities requiring concentration and/or vigilance if he/she is and  sleepy.  Patient is NOT to drive if he/she is sleepy.     Obesity hypoventilation syndrome ([facility]) Continued  hypersomnia and tiredness and fatigue despite allegedly been compliant with CPAP.   R/O OHS. Needs abg, pft.   Hypersomnia with sleep apnea Continued hypersomnia, fatigue despite adequate sleep and allegedly using CPAP. Need to get download. May need to adjust CPAP settings. R/O OHS.  Only sedating medication is gabapentin given by [facility] for which she takes 800 mg at bedtime and 300 mg during daytime. 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   Morbid obesity ([place]) Weight reduction     Thank you very much for letting me participate in this patient's care. Please do not hesitate to give me a call if you have any questions or concerns regarding the treatment plan.   Patient will follow up with me in 6-8 weeks    J. Shirl Harris, MD 02/18/2016   11:11 AM Pulmonary and [place] Pager: [phone] Office: [phone], Fax: [phone]

## 2016-02-18 NOTE — Assessment & Plan Note (Signed)
Continued  hypersomnia and tiredness and fatigue despite allegedly been compliant with CPAP.   R/O OHS. Needs abg, pft.

## 2016-02-18 NOTE — Assessment & Plan Note (Signed)
Weight reduction 

## 2016-02-18 NOTE — Assessment & Plan Note (Addendum)
Continued hypersomnia, fatigue despite adequate sleep and allegedly using CPAP. Need to get download. May need to adjust CPAP settings. R/O OHS.  Only sedating medication is gabapentin given by [facility] for which she takes 800 mg at bedtime and 300 mg during daytime. 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

## 2016-02-21 DIAGNOSIS — M5136 Other intervertebral disc degeneration, lumbar region: Secondary | ICD-10-CM | POA: Diagnosis not present

## 2016-02-21 DIAGNOSIS — M9905 Segmental and somatic dysfunction of pelvic region: Secondary | ICD-10-CM | POA: Diagnosis not present

## 2016-02-21 DIAGNOSIS — M9902 Segmental and somatic dysfunction of thoracic region: Secondary | ICD-10-CM | POA: Diagnosis not present

## 2016-02-21 DIAGNOSIS — M9903 Segmental and somatic dysfunction of lumbar region: Secondary | ICD-10-CM | POA: Diagnosis not present

## 2016-02-21 MED FILL — FUROSEMIDE 20 MG TABLET: 20 | 20 days supply | Qty: 40 | Fill #6

## 2016-02-25 ENCOUNTER — Ambulatory Visit (HOSPITAL_COMMUNITY)
Admission: RE | Admit: 2016-02-25 | Discharge: 2016-02-25 | Disposition: A | Discharge status: Home / Self Care | Payer: 59 | Source: Ambulatory Visit | Attending: Pulmonary Disease | Admitting: Pulmonary Disease

## 2016-02-25 ENCOUNTER — Other Ambulatory Visit: Payer: Self-pay | Admitting: Family Medicine

## 2016-02-25 ENCOUNTER — Encounter: Payer: Self-pay | Admitting: Family Medicine

## 2016-02-25 DIAGNOSIS — R0600 Dyspnea, unspecified: Secondary | ICD-10-CM | POA: Diagnosis not present

## 2016-02-25 DIAGNOSIS — M9905 Segmental and somatic dysfunction of pelvic region: Secondary | ICD-10-CM | POA: Diagnosis not present

## 2016-02-25 DIAGNOSIS — M5136 Other intervertebral disc degeneration, lumbar region: Secondary | ICD-10-CM | POA: Diagnosis not present

## 2016-02-25 DIAGNOSIS — M9902 Segmental and somatic dysfunction of thoracic region: Secondary | ICD-10-CM | POA: Diagnosis not present

## 2016-02-25 DIAGNOSIS — M9903 Segmental and somatic dysfunction of lumbar region: Secondary | ICD-10-CM | POA: Diagnosis not present

## 2016-02-25 LAB — PULMONARY FUNCTION TEST
DL/VA % pred: 109 %
DL/VA: 5.25 ml/min/mmHg/L
DLCO cor % pred: 79 %
DLCO cor: 19.25 ml/min/mmHg
DLCO unc % pred: 76 %
DLCO unc: 18.63 ml/min/mmHg
FEF 25-75 Post: 3.56 L/sec
FEF 25-75 Pre: 2.83 L/sec
FEF2575-%Change-Post: 25 %
FEF2575-%Pred-Post: 158 %
FEF2575-%Pred-Pre: 125 %
FEV1-%Change-Post: 5 %
FEV1-%Pred-Post: 107 %
FEV1-%Pred-Pre: 101 %
FEV1-Post: 2.36 L
FEV1-Pre: 2.24 L
FEV1FVC-%Change-Post: 3 %
FEV1FVC-%Pred-Pre: 106 %
FEV6-%Change-Post: 1 %
FEV6-%Pred-Post: 99 %
FEV6-%Pred-Pre: 97 %
FEV6-Post: 2.67 L
FEV6-Pre: 2.63 L
FEV6FVC-%Pred-Post: 103 %
FEV6FVC-%Pred-Pre: 103 %
FVC-%Change-Post: 1 %
FVC-%Pred-Post: 95 %
FVC-%Pred-Pre: 94 %
FVC-Post: 2.67 L
FVC-Pre: 2.63 L
Post FEV1/FVC ratio: 88 %
Post FEV6/FVC ratio: 100 %
Pre FEV1/FVC ratio: 85 %
Pre FEV6/FVC Ratio: 100 %
RV % pred: 74 %
RV: 1.4 L
TLC % pred: 79 %
TLC: 4.02 L

## 2016-02-25 LAB — BLOOD GAS, ARTERIAL
Acid-base deficit: 2.5 mmol/L — ABNORMAL HIGH (ref 0.0–2.0)
Bicarbonate: 21.1 mmol/L (ref 20.0–28.0)
Drawn by: 244901
FIO2: 21
O2 Saturation: 96 %
Patient temperature: 98.6
pCO2 arterial: 34.1 mmHg (ref 32.0–48.0)
pH, Arterial: 7.407 (ref 7.350–7.450)
pO2, Arterial: 87.4 mmHg (ref 83.0–108.0)

## 2016-02-25 MED ORDER — ALBUTEROL SULFATE (2.5 MG/3ML) 0.083% IN NEBU
2.5000 mg | INHALATION_SOLUTION | Freq: Once | RESPIRATORY_TRACT | Status: AC
  Administered 2016-02-25: 2.5 mg via RESPIRATORY_TRACT

## 2016-02-28 DIAGNOSIS — M5136 Other intervertebral disc degeneration, lumbar region: Secondary | ICD-10-CM | POA: Diagnosis not present

## 2016-02-28 DIAGNOSIS — M9903 Segmental and somatic dysfunction of lumbar region: Secondary | ICD-10-CM | POA: Diagnosis not present

## 2016-02-28 DIAGNOSIS — M9902 Segmental and somatic dysfunction of thoracic region: Secondary | ICD-10-CM | POA: Diagnosis not present

## 2016-02-28 DIAGNOSIS — M9905 Segmental and somatic dysfunction of pelvic region: Secondary | ICD-10-CM | POA: Diagnosis not present

## 2016-02-28 MED FILL — GABAPENTIN 100 MG CAPSULE: 100 | 90 days supply | Qty: 180 | Fill #0

## 2016-02-28 MED FILL — GABAPENTIN 400 MG CAPSULE: 400 | 90 days supply | Qty: 180 | Fill #0

## 2016-03-02 DIAGNOSIS — M5136 Other intervertebral disc degeneration, lumbar region: Secondary | ICD-10-CM | POA: Diagnosis not present

## 2016-03-02 DIAGNOSIS — M9905 Segmental and somatic dysfunction of pelvic region: Secondary | ICD-10-CM | POA: Diagnosis not present

## 2016-03-02 DIAGNOSIS — M9903 Segmental and somatic dysfunction of lumbar region: Secondary | ICD-10-CM | POA: Diagnosis not present

## 2016-03-02 DIAGNOSIS — M9902 Segmental and somatic dysfunction of thoracic region: Secondary | ICD-10-CM | POA: Diagnosis not present

## 2016-03-03 DIAGNOSIS — M9905 Segmental and somatic dysfunction of pelvic region: Secondary | ICD-10-CM | POA: Diagnosis not present

## 2016-03-03 DIAGNOSIS — M9902 Segmental and somatic dysfunction of thoracic region: Secondary | ICD-10-CM | POA: Diagnosis not present

## 2016-03-03 DIAGNOSIS — M9903 Segmental and somatic dysfunction of lumbar region: Secondary | ICD-10-CM | POA: Diagnosis not present

## 2016-03-03 DIAGNOSIS — M5136 Other intervertebral disc degeneration, lumbar region: Secondary | ICD-10-CM | POA: Diagnosis not present

## 2016-03-06 DIAGNOSIS — M9903 Segmental and somatic dysfunction of lumbar region: Secondary | ICD-10-CM | POA: Diagnosis not present

## 2016-03-06 DIAGNOSIS — M5136 Other intervertebral disc degeneration, lumbar region: Secondary | ICD-10-CM | POA: Diagnosis not present

## 2016-03-06 DIAGNOSIS — M9902 Segmental and somatic dysfunction of thoracic region: Secondary | ICD-10-CM | POA: Diagnosis not present

## 2016-03-06 DIAGNOSIS — M9905 Segmental and somatic dysfunction of pelvic region: Secondary | ICD-10-CM | POA: Diagnosis not present

## 2016-03-07 ENCOUNTER — Telehealth: Payer: Self-pay | Admitting: Pulmonary Disease

## 2016-03-07 MED FILL — SPIRONOLACTONE 25 MG TABLET: 25 | 90 days supply | Qty: 180 | Fill #1

## 2016-03-07 NOTE — Telephone Encounter (Signed)
Dr. Corrie Dandy  Spoke with the pt. And was able to give her, her results. She will bring her SD card this Thursday so we can obtain a download.  Please let me know if there is anything else you needed.

## 2016-03-07 NOTE — Telephone Encounter (Signed)
Called pt. Back about her results and told her Dr. Corrie Dandy message, She stated she will bring her card on Thursday of this week so we can get a download. Nothing further needed at this time

## 2016-03-08 ENCOUNTER — Encounter: Payer: Self-pay | Admitting: Pulmonary Disease

## 2016-03-09 ENCOUNTER — Telehealth: Payer: Self-pay | Admitting: Pulmonary Disease

## 2016-03-09 DIAGNOSIS — M9902 Segmental and somatic dysfunction of thoracic region: Secondary | ICD-10-CM | POA: Diagnosis not present

## 2016-03-09 DIAGNOSIS — M9905 Segmental and somatic dysfunction of pelvic region: Secondary | ICD-10-CM | POA: Diagnosis not present

## 2016-03-09 DIAGNOSIS — M9903 Segmental and somatic dysfunction of lumbar region: Secondary | ICD-10-CM | POA: Diagnosis not present

## 2016-03-09 DIAGNOSIS — M5136 Other intervertebral disc degeneration, lumbar region: Secondary | ICD-10-CM | POA: Diagnosis not present

## 2016-03-09 NOTE — Telephone Encounter (Signed)
LMOM TCB   AD would like to set her up with the next available appt. With him.

## 2016-03-09 NOTE — Telephone Encounter (Signed)
   Patient was seen for hypersomnia despite CPAP use. We were finally able to obtain download on her machine: Download the last month (November 2017): 97%, AHI 1.3. Auto CPAP 5-20 centimeters water. PFT with mild restrictive ventilatory defect likely related to obesity. ABG without hypercapnia. (7.4/34/87).   Plan : 1. [name] > kindly please call the patient and tell her that I need to see her in order to discuss my plan with her. She has an appointment to be seen on December 29th . I was not sure if there is an opening prior to that. If none,  I can just see her on December 29th. Tell her I need to discuss with her treatment options/meds for her hypersomnia.  Thanks.  [name], MD 03/09/2016, 12:56 PM [place] [facility] Pager [phone] After 3 pm or if no answer, call [phone]

## 2016-03-09 NOTE — Telephone Encounter (Signed)
Yes. Thanks.  [name], MD 03/09/2016, 2:26 PM [place] [facility] Pager [phone] After 3 pm or if no answer, call [phone]

## 2016-03-09 NOTE — Telephone Encounter (Signed)
I can get her an appointment the end of  This month, you are booked up in Dec. Is that ok with you?

## 2016-03-10 ENCOUNTER — Telehealth: Payer: Self-pay | Admitting: Pulmonary Disease

## 2016-03-10 NOTE — Telephone Encounter (Signed)
According to chart review Pt. Call was returned and an appointment has been set up. Nothing further is needed

## 2016-03-10 NOTE — Telephone Encounter (Signed)
This appointment date is fine. Message will be closed.

## 2016-03-13 DIAGNOSIS — M9905 Segmental and somatic dysfunction of pelvic region: Secondary | ICD-10-CM | POA: Diagnosis not present

## 2016-03-13 DIAGNOSIS — M9902 Segmental and somatic dysfunction of thoracic region: Secondary | ICD-10-CM | POA: Diagnosis not present

## 2016-03-13 DIAGNOSIS — M5136 Other intervertebral disc degeneration, lumbar region: Secondary | ICD-10-CM | POA: Diagnosis not present

## 2016-03-13 DIAGNOSIS — M9903 Segmental and somatic dysfunction of lumbar region: Secondary | ICD-10-CM | POA: Diagnosis not present

## 2016-03-14 ENCOUNTER — Other Ambulatory Visit: Payer: Self-pay | Admitting: Family Medicine

## 2016-03-15 MED FILL — FUROSEMIDE 20 MG TABLET: 20 | 20 days supply | Qty: 40 | Fill #0

## 2016-03-21 MED FILL — SYNTHROID 100 MCG TABLET: 100 | 90 days supply | Qty: 90 | Fill #0

## 2016-03-31 ENCOUNTER — Other Ambulatory Visit: Payer: Self-pay | Admitting: Family Medicine

## 2016-03-31 ENCOUNTER — Telehealth: Payer: Self-pay | Admitting: Family Medicine

## 2016-03-31 DIAGNOSIS — E119 Type 2 diabetes mellitus without complications: Secondary | ICD-10-CM

## 2016-03-31 NOTE — Telephone Encounter (Signed)
Lab added on to labs for 04/03/16

## 2016-03-31 NOTE — Telephone Encounter (Signed)
OK to add a urine for microalb for DM type 2

## 2016-03-31 NOTE — Telephone Encounter (Signed)
Patient sent a message stating that she is coming in for labs on 04/03/16. She would like to get a urine protein check added to her labs. Please advise.   Patient phone: [phone]

## 2016-04-03 ENCOUNTER — Encounter: Payer: Self-pay | Admitting: Family Medicine

## 2016-04-03 ENCOUNTER — Other Ambulatory Visit: Payer: 59

## 2016-04-03 NOTE — Addendum Note (Signed)
Addended by: Sharon Seller B on: 04/03/2016 02:50 PM   Modules accepted: Orders

## 2016-04-04 ENCOUNTER — Other Ambulatory Visit (INDEPENDENT_AMBULATORY_CARE_PROVIDER_SITE_OTHER): Payer: 59

## 2016-04-04 DIAGNOSIS — E1169 Type 2 diabetes mellitus with other specified complication: Secondary | ICD-10-CM | POA: Diagnosis not present

## 2016-04-04 DIAGNOSIS — E119 Type 2 diabetes mellitus without complications: Secondary | ICD-10-CM

## 2016-04-04 DIAGNOSIS — R0600 Dyspnea, unspecified: Secondary | ICD-10-CM

## 2016-04-04 DIAGNOSIS — E118 Type 2 diabetes mellitus with unspecified complications: Secondary | ICD-10-CM | POA: Diagnosis not present

## 2016-04-04 DIAGNOSIS — R5383 Other fatigue: Secondary | ICD-10-CM

## 2016-04-04 DIAGNOSIS — I10 Essential (primary) hypertension: Secondary | ICD-10-CM

## 2016-04-04 DIAGNOSIS — E782 Mixed hyperlipidemia: Secondary | ICD-10-CM

## 2016-04-04 DIAGNOSIS — E559 Vitamin D deficiency, unspecified: Secondary | ICD-10-CM | POA: Diagnosis not present

## 2016-04-04 DIAGNOSIS — E669 Obesity, unspecified: Secondary | ICD-10-CM

## 2016-04-04 LAB — LIPID PANEL
Cholesterol: 201 mg/dL — ABNORMAL HIGH (ref 0–200)
HDL: 44.2 mg/dL (ref >39.00)
LDL Cholesterol: 132 mg/dL — ABNORMAL HIGH (ref 0–99)
NonHDL: 157.19
Total CHOL/HDL Ratio: 5
Triglycerides: 128 mg/dL (ref 0.0–149.0)
VLDL: 25.6 mg/dL (ref 0.0–40.0)

## 2016-04-04 LAB — CBC
HCT: 39.3 % (ref 36.0–46.0)
Hemoglobin: 13.1 g/dL (ref 12.0–15.0)
MCHC: 33.3 g/dL (ref 30.0–36.0)
MCV: 87.5 fl (ref 78.0–100.0)
Platelets: 369 10*3/uL (ref 150.0–400.0)
RBC: 4.48 Mil/uL (ref 3.87–5.11)
RDW: 14 % (ref 11.5–15.5)
WBC: 4.4 10*3/uL (ref 4.0–10.5)

## 2016-04-04 LAB — COMPREHENSIVE METABOLIC PANEL
ALT: 24 U/L (ref 0–35)
AST: 21 U/L (ref 0–37)
Albumin: 4.4 g/dL (ref 3.5–5.2)
Alkaline Phosphatase: 53 U/L (ref 39–117)
BUN: 11 mg/dL (ref 6–23)
CO2: 24 mEq/L (ref 19–32)
Calcium: 9.7 mg/dL (ref 8.4–10.5)
Chloride: 107 mEq/L (ref 96–112)
Creatinine, Ser: 1.02 mg/dL (ref 0.40–1.20)
GFR: 72.26 mL/min (ref >60.00)
Glucose, Bld: 96 mg/dL (ref 70–99)
Potassium: 4 mEq/L (ref 3.5–5.1)
Sodium: 142 mEq/L (ref 135–145)
Total Bilirubin: 0.3 mg/dL (ref 0.2–1.2)
Total Protein: 7 g/dL (ref 6.0–8.3)

## 2016-04-04 LAB — TSH: TSH: 0.62 u[IU]/mL (ref 0.35–4.50)

## 2016-04-04 LAB — HEMOGLOBIN A1C: Hgb A1c MFr Bld: 6.3 % (ref 4.6–6.5)

## 2016-04-04 LAB — MICROALBUMIN / CREATININE URINE RATIO
Creatinine,U: 274.4 mg/dL
Microalb Creat Ratio: 0.4 mg/g (ref 0.0–30.0)
Microalb, Ur: 1 mg/dL (ref 0.0–1.9)

## 2016-04-04 MED FILL — METFORMIN HCL ER 500 MG TAB: 500 | 30 days supply | Qty: 60 | Fill #2

## 2016-04-04 MED FILL — FENOFIBRATE 130 MG CAPSULE: 130 | 90 days supply | Qty: 90 | Fill #2

## 2016-04-07 LAB — VITAMIN D 1,25 DIHYDROXY
Vitamin D 1, 25 (OH)2 Total: 56 pg/mL (ref 18–72)
Vitamin D2 1, 25 (OH)2: 40 pg/mL
Vitamin D3 1, 25 (OH)2: 16 pg/mL

## 2016-04-07 MED FILL — VENLAFAXINE HCL ER 75 MG CA: 75 | 30 days supply | Qty: 90 | Fill #1

## 2016-04-07 MED FILL — FUROSEMIDE 20 MG TABLET: 20 | 20 days supply | Qty: 40 | Fill #1

## 2016-04-07 MED FILL — FLUoxetine HCL 10 MG CAPS: 10 | 30 days supply | Qty: 90 | Fill #2

## 2016-04-10 ENCOUNTER — Ambulatory Visit (INDEPENDENT_AMBULATORY_CARE_PROVIDER_SITE_OTHER): Payer: 59 | Admitting: Family Medicine

## 2016-04-10 ENCOUNTER — Encounter: Payer: Self-pay | Admitting: Family Medicine

## 2016-04-10 DIAGNOSIS — E782 Mixed hyperlipidemia: Secondary | ICD-10-CM

## 2016-04-10 DIAGNOSIS — E038 Other specified hypothyroidism: Secondary | ICD-10-CM

## 2016-04-10 DIAGNOSIS — E6609 Other obesity due to excess calories: Secondary | ICD-10-CM

## 2016-04-10 DIAGNOSIS — J309 Allergic rhinitis, unspecified: Secondary | ICD-10-CM

## 2016-04-10 DIAGNOSIS — I10 Essential (primary) hypertension: Secondary | ICD-10-CM | POA: Diagnosis not present

## 2016-04-10 DIAGNOSIS — G4733 Obstructive sleep apnea (adult) (pediatric): Secondary | ICD-10-CM

## 2016-04-10 DIAGNOSIS — E669 Obesity, unspecified: Secondary | ICD-10-CM

## 2016-04-10 DIAGNOSIS — E1169 Type 2 diabetes mellitus with other specified complication: Secondary | ICD-10-CM | POA: Diagnosis not present

## 2016-04-10 MED ORDER — FLUTICASONE PROPIONATE 50 MCG/ACT NA SUSP: 2.0000 | Freq: Every day | NASAL | 6 refills | Status: DC

## 2016-04-10 NOTE — Assessment & Plan Note (Signed)
Well controlled, no changes to meds. Encouraged heart healthy diet such as the DASH diet and exercise as tolerated.  °

## 2016-04-10 NOTE — Progress Notes (Signed)
Patient ID: [name], female   DOB: 1960-10-31, 55 y.o.   MRN: [id number]    Subjective:    Patient ID: [name], female    DOB: Nov 16, 1960, 55 y.o.   MRN: [id number]  Chief Complaint  Patient presents with  . Follow-up    HPI Patient is in today for follow up. She is not offering any new or acute concerns today. No recent illness or hospitalizations. Continues to struggle with allergies and nasal congestion routinely. Notes ongoing back and joint pain as well. Denies CP/palp/SOB/HA/congestion/fevers/GI or GU c/o. Taking meds as prescribed  Past Medical History:  Diagnosis Date  . Anxiety   . Chronic pain syndrome 01/06/2013  . Chronic rhinosinusitis   . Colon polyps   . Cough 01/06/2013  . Depression   . Diabetes ([place]) 01/06/2013  . Diabetes mellitus type 2 in obese [facility]) 01/06/2013   Sees Dr Calvert Cantor for eye exam Does not see podiatry, foot exam today unremarkable except for thick cracking skin on heals   . Edema 01/06/2013  . Fibroid, uterine   . GERD (gastroesophageal reflux disease)   . Glaucoma 12-13  . Hoarseness   . Hoarseness of voice 05/11/2013  . Hyperglycemia 04/13/2013  . Hyperlipemia, mixed 06/06/2007   Qualifier: Diagnosis of  By: Wynona Luna She feels Lipitor caused increased low back pain and weakness    . Hyperlipidemia   . Hypertension   . Hypokalemia 02/05/2013  . IBS (irritable bowel syndrome) 02/13/2011  . Low back pain 03/03/2013  . Muscle cramp 05/22/2014  . Obesity   . Obesity, unspecified 05/11/2013  . OSA (obstructive sleep apnea)   . Pain in joint, shoulder region 06/27/2015  . Perimenopause 10/05/2012  . Raynaud disease 06/16/2013  . Thyroid disease    hypothyroidism    Past Surgical History:  Procedure Laterality Date  . ABDOMINAL HYSTERECTOMY    . CHOLECYSTECTOMY      Family History  Problem Relation Age of Onset  . Alcohol abuse Father   . Cancer Father     renal and colon  . Hyperlipidemia Father   . Hypertension  Father   . Cancer Paternal Grandfather     colon  . Diabetes      Social History   Social History  . Marital status: Significant Other    Spouse name: N/A  . Number of children: N/A  . Years of education: N/A   Occupational History  . Not on file.   Social History Main Topics  . Smoking status: Former Smoker    Packs/day: 0.50    Years: 30.00    Types: Cigarettes    Quit date: 04/25/2003  . Smokeless tobacco: Never Used     Comment: smoked since age 62  . Alcohol use No  . Drug use: Unknown  . Sexual activity: Not on file   Other Topics Concern  . Not on file   Social History Narrative   Endo-- Dr Dwyane Dee   ENT--Dr shoemaker   GI--Dr Buccini   Pulm--Dr Clance   Rheum--Dr Charlestine Night          Outpatient Medications Prior to Visit  Medication Sig Dispense Refill  . amLODipine (NORVASC) 5 MG tablet Take 1 tablet (5 mg total) by mouth daily. 90 tablet 1  . fenofibrate micronized (ANTARA) 130 MG capsule Take 1 capsule (130 mg total) by mouth daily before breakfast. 30 capsule 6  . FLUoxetine (PROZAC) 10 MG capsule TAKE 3 CAPSULES BY MOUTH  DAILY. (Patient taking differently: take 1 capsule daily) 90 capsule PRN  . furosemide (LASIX) 20 MG tablet TAKE 1-2 TABLETS BY MOUTH DAILY AS NEEDED FOR FLUID OR EDEMA 40 tablet 6  . gabapentin (NEURONTIN) 100 MG capsule TAKE 1 CAPSULE BY MOUTH TWICE DAILY DURING THE DAY 180 capsule 1  . gabapentin (NEURONTIN) 400 MG capsule TAKE 2 CAPSULES BY MOUTH AT BEDTIME. 180 capsule 1  . hyoscyamine (LEVSIN, ANASPAZ) 0.125 MG tablet Take 1 tablet (0.125 mg total) by mouth every 4 (four) hours as needed for cramping. 40 tablet 1  . ipratropium (ATROVENT) 0.06 % nasal spray Place 2 sprays into both nostrils 4 (four) times daily. 15 mL 12  . metFORMIN (GLUCOPHAGE-XR) 500 MG 24 hr tablet TAKE 2 TABLETS BY MOUTH DAILY WITH BREAKFAST. 60 tablet PRN  . Nebivolol HCl (BYSTOLIC) 20 MG TABS Take 1 tablet (20 mg total) by mouth daily. 90 tablet 2  .  Spacer/Aero-Holding Dorise Bullion Use as directed 1 each 1  . spironolactone (ALDACTONE) 25 MG tablet Take 1 tablet (25 mg total) by mouth 2 (two) times daily. 60 tablet 6  . SYNTHROID 100 MCG tablet TAKE 1 TABLET BY MOUTH DAILY BEFORE BREAKFAST 30 tablet 5  . TRUEPLUS LANCETS 30G MISC 1 Device by Does not apply route daily as needed. DX 250.00 by Does not apply route daily as needed. DX 250.00 100 each 2  . venlafaxine XR (EFFEXOR-XR) 75 MG 24 hr capsule TAKE 3 CAPSULES BY MOUTH ONCE DAILY WITH BREAKFAST. 90 capsule 3  . VENTOLIN HFA 108 (90 Base) MCG/ACT inhaler INHALE 2 PUFFS INTO THE LUNGS EVERY 4 HOURS AS NEEDED FOR WHEEZING OR SHORTNESS OF BREATH. 18 g 6  . Vitamin D, Ergocalciferol, (DRISDOL) 50000 units CAPS capsule Take 1 capsule (50,000 Units total) by mouth every 7 (seven) days. 12 capsule 1   No facility-administered medications prior to visit.     Allergies  Allergen Reactions  . Losartan Cough  . Aspirin     REACTION: Upset stomach  . Atorvastatin     myalgias  . Codeine   . Amoxicillin Rash  . Erythromycin Rash    Review of Systems  Constitutional: Positive for malaise/fatigue. Negative for fever.  HENT: Positive for congestion.   Eyes: Negative for blurred vision.  Respiratory: Negative for cough.   Cardiovascular: Negative for chest pain and palpitations.  Gastrointestinal: Negative for vomiting.  Musculoskeletal: Positive for back pain and joint pain.  Skin: Negative for rash.  Neurological: Negative for loss of consciousness and headaches.       Objective:    Physical Exam  Constitutional: She is oriented to person, place, and time. She appears well-developed and well-nourished. No distress.  HENT:  Head: Normocephalic and atraumatic.  Nose: Nose normal.  Nasal mucosa boggy and erythematous  Eyes: Right eye exhibits no discharge. Left eye exhibits no discharge.  Neck: Normal range of motion. Neck supple.  Cardiovascular: Normal rate and regular rhythm.    No murmur heard. Pulmonary/Chest: Effort normal and breath sounds normal.  Abdominal: Soft. Bowel sounds are normal. There is no tenderness.  Musculoskeletal: She exhibits no edema.  Neurological: She is alert and oriented to person, place, and time.  Skin: Skin is warm and dry.  Psychiatric: She has a normal mood and affect.  Nursing note and vitals reviewed.   BP 102/60 (BP Location: Left Arm, Patient Position: Sitting, Cuff Size: Large)   Pulse 70   Temp 98.4 F (36.9 C) (Oral)   Resp  16   Ht 5\' 5"  (1.651 m)   Wt 260 lb 3.2 oz (118 kg)   SpO2 98%   BMI 43.30 kg/m  Wt Readings from Last 3 Encounters:  04/21/16 258 lb 3.2 oz (117.1 kg)  04/10/16 260 lb 3.2 oz (118 kg)  02/18/16 262 lb 12.8 oz (119.2 kg)     Lab Results  Component Value Date   WBC 4.4 04/04/2016   HGB 13.1 04/04/2016   HCT 39.3 04/04/2016   PLT 369.0 04/04/2016   GLUCOSE 96 04/04/2016   CHOL 201 (H) 04/04/2016   TRIG 128.0 04/04/2016   HDL 44.20 04/04/2016   LDLDIRECT 160.8 01/30/2014   LDLCALC 132 (H) 04/04/2016   ALT 24 04/04/2016   AST 21 04/04/2016   NA 142 04/04/2016   K 4.0 04/04/2016   CL 107 04/04/2016   CREATININE 1.02 04/04/2016   BUN 11 04/04/2016   CO2 24 04/04/2016   TSH 0.62 04/04/2016   HGBA1C 6.3 04/04/2016   MICROALBUR 1.0 04/04/2016    Lab Results  Component Value Date   TSH 0.62 04/04/2016   Lab Results  Component Value Date   WBC 4.4 04/04/2016   HGB 13.1 04/04/2016   HCT 39.3 04/04/2016   MCV 87.5 04/04/2016   PLT 369.0 04/04/2016   Lab Results  Component Value Date   NA 142 04/04/2016   K 4.0 04/04/2016   CO2 24 04/04/2016   GLUCOSE 96 04/04/2016   BUN 11 04/04/2016   CREATININE 1.02 04/04/2016   BILITOT 0.3 04/04/2016   ALKPHOS 53 04/04/2016   AST 21 04/04/2016   ALT 24 04/04/2016   PROT 7.0 04/04/2016   ALBUMIN 4.4 04/04/2016   CALCIUM 9.7 04/04/2016   GFR 72.26 04/04/2016   Lab Results  Component Value Date   CHOL 201 (H) 04/04/2016   Lab  Results  Component Value Date   HDL 44.20 04/04/2016   Lab Results  Component Value Date   LDLCALC 132 (H) 04/04/2016   Lab Results  Component Value Date   TRIG 128.0 04/04/2016   Lab Results  Component Value Date   CHOLHDL 5 04/04/2016   Lab Results  Component Value Date   HGBA1C 6.3 04/04/2016       Assessment & Plan:   Problem List Items Addressed This Visit    Hypothyroidism    On Levothyroxine, continue to monitor      Hyperlipemia, mixed    Encouraged heart healthy diet, increase exercise, avoid trans fats, consider a krill oil cap daily. Improving with cod liver oil and Fenofibrate, does not tolerate statins      Relevant Orders   Lipid panel   OSA (obstructive sleep apnea)    Using CPAP daily with good results.       Essential hypertension    Well controlled, no changes to meds. Encouraged heart healthy diet such as the DASH diet and exercise as tolerated.       Relevant Orders   CBC   Comprehensive metabolic panel   TSH   Obesity    Encouraged DASH diet, decrease po intake and increase exercise as tolerated. Needs 7-8 hours of sleep nightly. Avoid trans fats, eat small, frequent meals every 4-5 hours with lean proteins, complex carbs and healthy fats. Minimize simple carbs      Diabetes mellitus type 2 in obese (HCC)    hgba1c acceptable, minimize simple carbs. Increase exercise as tolerated. Continue current meds      Relevant Orders  Hemoglobin A1c   Allergic rhinitis    Flared recently. Encouraged bid antihistamines, nasal steroids, may need follow up with allergist.          I am having [name] start on fluticasone. I am also having her maintain her TRUEPLUS LANCETS 30G, hyoscyamine, Spacer/Aero-Holding Chambers, ipratropium, Nebivolol HCl, FLUoxetine, metFORMIN, VENTOLIN HFA, SYNTHROID, fenofibrate micronized, Vitamin D (Ergocalciferol), venlafaxine XR, spironolactone, amLODipine, gabapentin, gabapentin, and furosemide.  Meds ordered  this encounter  Medications  . fluticasone (FLONASE) 50 MCG/ACT nasal spray    Sig: Place 2 sprays into both nostrils daily.    Dispense:  16 g    Refill:  6   LPN served as scribe during this visit. History, Physical and Plan performed by medical provider. Documentation and orders reviewed and attested to.   Penni Homans, MD

## 2016-04-10 NOTE — Assessment & Plan Note (Signed)
Started to see a chiropractor for her pain and it has helped considerably. She has added some water exercises and that helps. Turmeric has also been. Consider lidocaine products, is using biofreeze

## 2016-04-10 NOTE — Assessment & Plan Note (Signed)
Encouraged heart healthy diet, increase exercise, avoid trans fats, consider a krill oil cap daily. Improving with cod liver oil and Fenofibrate, does not tolerate statins

## 2016-04-10 NOTE — Assessment & Plan Note (Signed)
hgba1c acceptable, minimize simple carbs. Increase exercise as tolerated. Continue current meds 

## 2016-04-10 NOTE — Assessment & Plan Note (Signed)
On Levothyroxine, continue to monitor 

## 2016-04-10 NOTE — Assessment & Plan Note (Signed)
Using CPAP daily with good results.

## 2016-04-10 NOTE — Progress Notes (Signed)
Pre visit review using our clinic review tool, if applicable. No additional management support is needed unless otherwise documented below in the visit note. 

## 2016-04-10 NOTE — Patient Instructions (Signed)
Consider lidocaine patches or gels by Aspercreme, salon pas or Icy Hot Hypertension Hypertension is another name for high blood pressure. High blood pressure forces your heart to work harder to pump blood. A blood pressure reading has two numbers, which includes a higher number over a lower number (example: 110/72). Follow these instructions at home:  Have your blood pressure rechecked by your doctor.  Only take medicine as told by your doctor. Follow the directions carefully. The medicine does not work as well if you skip doses. Skipping doses also puts you at risk for problems.  Do not smoke.  Monitor your blood pressure at home as told by your doctor. Contact a doctor if:  You think you are having a reaction to the medicine you are taking.  You have repeat headaches or feel dizzy.  You have puffiness (swelling) in your ankles.  You have trouble with your vision. Get help right away if:  You get a very bad headache and are confused.  You feel weak, numb, or faint.  You get chest or belly (abdominal) pain.  You throw up (vomit).  You cannot breathe very well. This information is not intended to replace advice given to you by your health care provider. Make sure you discuss any questions you have with your health care provider. Document Released: 09/27/2007 Document Revised: 09/16/2015 Document Reviewed: 01/31/2013 Elsevier Interactive Patient Education  2017 [employer].

## 2016-04-20 ENCOUNTER — Encounter: Payer: Self-pay | Admitting: Pulmonary Disease

## 2016-04-21 ENCOUNTER — Encounter: Payer: Self-pay | Admitting: Pulmonary Disease

## 2016-04-21 ENCOUNTER — Ambulatory Visit (INDEPENDENT_AMBULATORY_CARE_PROVIDER_SITE_OTHER): Payer: 59 | Admitting: Pulmonary Disease

## 2016-04-21 DIAGNOSIS — G4710 Hypersomnia, unspecified: Secondary | ICD-10-CM

## 2016-04-21 DIAGNOSIS — G4733 Obstructive sleep apnea (adult) (pediatric): Secondary | ICD-10-CM | POA: Diagnosis not present

## 2016-04-21 DIAGNOSIS — G4730 Sleep apnea, unspecified: Secondary | ICD-10-CM

## 2016-04-21 NOTE — Assessment & Plan Note (Addendum)
Continued hypersomnia, fatigue despite adequate sleep and using CPAP. This however does not interfere with her functionality at work. 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 Denies falling asleep at work. ABG did not show hypercapnia. Only sedating medication is gabapentin given by [facility] for which she takes 800 mg at bedtime. She was on 300 mg of gabapentin in the morning which she has stopped. 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 I suggested we try to cut down the gabapentin nighttime dose to 400 mg and see if it helps her with the fatigue. Told her to give [place] a call if hypersomnia and fatigue "affects her functionality. At that time, she'll probably need modafinil or armodafinil.

## 2016-04-21 NOTE — Progress Notes (Signed)
Subjective:    Patient ID: [name], female    DOB: [birth year], 55 y.o.   MRN: [id number]  HPI  This is the case of [name], 55 y.o. Female, who was referred by Dr. Penni Homans  in consultation regarding OSA.   As you very well know, patient has a 15 PY smoking history, quit in 2005, not been diagnosed with asthma or copd.   Patient was diagnosed with OSA in 2008. Had a lab sleep study. Prior to that, she had a sleep study in the 90s which was (-). Lab study in 2008 was (-).  She was symptomatic so she did a HST.  HST was (+) with AHI of 12. She was started on autocpap. She got a new cpap machine in 2013.   She had snoring, had witnessed apneas, gasping, choking. Had hypersomnia affecting her fxnality. Sx improved with cpap therapy. She has used cpap since 2008.   She still complains of sleepiness, tiredness despite using cpap. No snoring, witnessed apneas, gasping, choking.  She is worse off cpap. She sleeps 8 hrs/night. Wakes up unrefreshed. Hypersomnia affects fxnality, especially in pm.   (-) abnormal behavior in sleep.  ESS 15.   She had surgery in June 2017.  She was  noted to have desaturation post op since she was not able to bring her machine.   Denies significant depression or anxiety.   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  It was started by [facility] in 2015.   ROV 04/21/16 Pt returns to the office as follow-up on her sleep apnea. Since last seen, she continues to use her CPAP machine. Download the last month: 100%, AHI of 0.9. Has hypersomnia but it does not affect her fxnality. He feels exhausted in the morning after having "very active dreams" at night.  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   Review of Systems  Constitutional: Negative.  Negative for fever and unexpected weight change.  HENT: Negative for congestion, dental problem, ear pain, nosebleeds, postnasal drip, rhinorrhea,  sinus pressure, sneezing, sore throat and trouble swallowing.   Eyes: Negative.  Negative for redness and itching.  Respiratory: Positive for shortness of breath. Negative for cough, chest tightness and wheezing.   Cardiovascular: Negative for palpitations and leg swelling.  Gastrointestinal: Negative.  Negative for nausea and vomiting.  Endocrine: Negative.   Genitourinary: Negative.  Negative for dysuria.  Musculoskeletal: Positive for joint swelling.  Skin: Negative.  Negative for rash.  Allergic/Immunologic: Positive for environmental allergies.  Neurological: Positive for dizziness. Negative for headaches.  Hematological: Bruises/bleeds easily.  Psychiatric/Behavioral: Negative.  Negative for dysphoric mood. The patient is not nervous/anxious.        Objective:   Physical Exam  Vitals:  Vitals:   04/21/16 1037  BP: (!) 98/56  Pulse: 78  SpO2: 94%  Weight: 258 lb 3.2 oz (117.1 kg)  Height: 5' 4.5" (1.638 m)    Constitutional/General:  Pleasant, well-nourished, well-developed, not in any distress,  Comfortably seating.  Well kempt  Body mass index is 43.64 kg/m. Wt Readings from Last 3 Encounters:  04/21/16 258 lb 3.2 oz (117.1 kg)  04/10/16 260 lb 3.2 oz (118 kg)  02/18/16 262 lb 12.8 oz (119.2 kg)      HEENT: Pupils equal and reactive to light and accommodation. Anicteric sclerae. Normal nasal mucosa.   No oral  lesions,  mouth clear,  oropharynx clear, no postnasal drip. (-) Oral thrush. No dental caries.  Airway - Mallampati class IV  Neck: No masses. Midline trachea. No JVD, (-) LAD. (-) bruits appreciated.  Respiratory/Chest: Grossly normal chest. (-) deformity. (-) Accessory muscle use.  Symmetric expansion. (-) Tenderness on palpation.  Resonant on percussion.  Diminished BS on both lower lung zones. (-) wheezing, crackles, rhonchi (-) egophony  Cardiovascular: Regular rate and  rhythm, heart sounds normal, no murmur or gallops, no peripheral  edema  Gastrointestinal:  Normal bowel sounds. Soft, non-tender. No hepatosplenomegaly.  (-) masses.   Musculoskeletal:  Normal muscle tone. Normal gait.   Extremities: Grossly normal. (-) clubbing, cyanosis.  (-) edema  Skin: (-) rash,lesions seen.   Neurological/Psychiatric : alert, oriented to time, place, person. Normal mood and affect          Assessment & Plan:  OSA (obstructive sleep apnea) Patient was diagnosed with OSA in 2008. Had a lab sleep study. Prior to that, she had a sleep study in the 90s which was (-). Lab study in 2008 was (-).  She was symptomatic so she did a HST.  HST was (+) with AHI of 12. She was started on autocpap. She got a new cpap machine in 2013.   She had snoring, had witnessed apneas, gasping, choking. Had hypersomnia affecting her fxnality. Sx improved with cpap therapy. She has used cpap since 2008.   She still complains of sleepiness but not as bad. She feels exhausted when she wakes up in am after having a night of "very active dreams". Denies abnormal behavior and sleep.  Download the last month: 100%, AHI 0.9. Auto CPAP 5-20 centimeters water.  Plan : We extensively discussed the importance of treating OSA and the need to use PAP therapy.   Continue with autocpap 5-20 cm water.  Has hypersomnia > will discuss in a separate problem.     Patient was instructed to have mask, tubings, filter, reservoir cleaned at least once a week with soapy water.  Patient was instructed to call the office if he/she is having issues with the PAP device.    I advised patient to obtain sufficient amount of sleep --  7 to 8 hours at least in a 24 hr period.  Patient was advised to follow good sleep hygiene.  Patient was advised NOT to engage in activities requiring concentration and/or vigilance if he/she is and  sleepy.  Patient is NOT to drive if he/she is sleepy.     Hypersomnia with sleep apnea Continued hypersomnia, fatigue despite adequate sleep  and using CPAP. This however does not interfere with her functionality at work. 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 Denies falling asleep at work. ABG did not show hypercapnia. Only sedating medication is gabapentin given by [facility] for which she takes 800 mg at bedtime. She was on 300 mg of gabapentin in the morning which she has stopped. 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 I suggested we try to cut down the gabapentin nighttime dose to 400 mg and see if it helps her with the fatigue. Told her to give [place] a call if hypersomnia and fatigue "affects her functionality. At that time, she'll probably need modafinil or armodafinil.   Morbid obesity ([place]) Weight reduction       Patient will follow up with me in 6 mos    J. Shirl Harris, MD 04/21/2016  11:11 AM [facility] [place] Pager: [phone] Office: [phone], Fax: [phone]

## 2016-04-21 NOTE — Assessment & Plan Note (Signed)
Weight reduction 

## 2016-04-21 NOTE — Patient Instructions (Signed)
  It was a pleasure taking care of you today!  Continue using your CPAP machine.   Please make sure you use your CPAP device everytime you sleep.  We will monitor the usage of your machine per your insurance requirement.  Your insurance company may take the machine from you if you are not using it regularly.   Please clean the mask, tubings, filter, water reservoir with soapy water every week.  Please use distilled water for the water reservoir.   Please call the office or your machine provider (DME company) if you are having issues with the device.   Try cutting down to Neurontin from 800 mg to 400 mg at bedtime.  Return to clinic in 6 months    with Dr. Corrie Dandy

## 2016-04-21 NOTE — Assessment & Plan Note (Addendum)
Patient was diagnosed with OSA in 2008. Had a lab sleep study. Prior to that, she had a sleep study in the 90s which was (-). Lab study in 2008 was (-).  She was symptomatic so she did a HST.  HST was (+) with AHI of 12. She was started on autocpap. She got a new cpap machine in 2013.   She had snoring, had witnessed apneas, gasping, choking. Had hypersomnia affecting her fxnality. Sx improved with cpap therapy. She has used cpap since 2008.   She still complains of sleepiness but not as bad. She feels exhausted when she wakes up in am after having a night of "very active dreams". Denies abnormal behavior and sleep.  Download the last month: 100%, AHI 0.9. Auto CPAP 5-20 centimeters water.  Plan : We extensively discussed the importance of treating OSA and the need to use PAP therapy.   Continue with autocpap 5-20 cm water.  Has hypersomnia > will discuss in a separate problem.     Patient was instructed to have mask, tubings, filter, reservoir cleaned at least once a week with soapy water.  Patient was instructed to call the office if he/she is having issues with the PAP device.    I advised patient to obtain sufficient amount of sleep --  7 to 8 hours at least in a 24 hr period.  Patient was advised to follow good sleep hygiene.  Patient was advised NOT to engage in activities requiring concentration and/or vigilance if he/she is and  sleepy.  Patient is NOT to drive if he/she is sleepy.

## 2016-04-26 NOTE — Assessment & Plan Note (Signed)
Flared recently. Encouraged bid antihistamines, nasal steroids, may need follow up with allergist.

## 2016-04-26 NOTE — Assessment & Plan Note (Signed)
Encouraged DASH diet, decrease po intake and increase exercise as tolerated. Needs 7-8 hours of sleep nightly. Avoid trans fats, eat small, frequent meals every 4-5 hours with lean proteins, complex carbs and healthy fats. Minimize simple carbs 

## 2016-04-28 DIAGNOSIS — M9905 Segmental and somatic dysfunction of pelvic region: Secondary | ICD-10-CM | POA: Diagnosis not present

## 2016-04-28 DIAGNOSIS — M9902 Segmental and somatic dysfunction of thoracic region: Secondary | ICD-10-CM | POA: Diagnosis not present

## 2016-04-28 DIAGNOSIS — M9903 Segmental and somatic dysfunction of lumbar region: Secondary | ICD-10-CM | POA: Diagnosis not present

## 2016-04-28 DIAGNOSIS — M5136 Other intervertebral disc degeneration, lumbar region: Secondary | ICD-10-CM | POA: Diagnosis not present

## 2016-05-04 ENCOUNTER — Other Ambulatory Visit: Payer: Self-pay | Admitting: Family Medicine

## 2016-05-04 MED FILL — FUROSEMIDE 20 MG TABLET: 20 | 20 days supply | Qty: 40 | Fill #2

## 2016-05-05 ENCOUNTER — Other Ambulatory Visit: Payer: Self-pay

## 2016-05-05 MED ORDER — NEBIVOLOL HCL 20 MG PO TABS: 1.0000 | ORAL_TABLET | Freq: Every day | ORAL | 2 refills | Status: DC

## 2016-05-05 MED FILL — BYSTOLIC 20 MG TABLET: 20 | 90 days supply | Qty: 90 | Fill #0

## 2016-05-08 ENCOUNTER — Other Ambulatory Visit: Payer: Self-pay | Admitting: Family Medicine

## 2016-05-08 MED FILL — VIT D2 1.25 MG (50,000 UNIT: 1.25 MG | 84 days supply | Qty: 12 | Fill #0

## 2016-05-12 ENCOUNTER — Encounter: Payer: Self-pay | Admitting: Pulmonary Disease

## 2016-05-12 NOTE — Telephone Encounter (Signed)
AD  Please advise-  Please see pt. email below   ----- Message -----    From: Norwood Levo. Kue    Sent: 05/12/2016  2:23 PM EST      To: Rush Landmark, MD Subject: Non-Urgent Medical Question  Dr Corrie Dandy, Chalmers Cater here  at my last visit  January  29 , you wanted me 2 cut back on my gabapentin 800 mg  to 400mg  I chose to not take it at all so I've been off of food two weeks now and I'm still feeling exhausted you talked about maybe giving female stimulants I would like to try that and see if that'll give me a little more energy alertness could we try that stimulant. My next visit with you is June 29th and if you decide that you will prescribe me a stimulant I use [facility] outpatient pharmacy. Thanks  [employer]

## 2016-05-15 MED ORDER — MODAFINIL 200 MG PO TABS: 200.0000 mg | ORAL_TABLET | Freq: Every day | ORAL | 0 refills | Status: DC

## 2016-05-15 MED FILL — MODAFINIL 200 MG TABLET: 200 | 30 days supply | Qty: 30 | Fill #0

## 2016-05-15 NOTE — Telephone Encounter (Signed)
Pt aware of rec's via telephone Modafinil 200mg  has been called in [facility] outpatient pharmacy.  Nothing further needed.

## 2016-05-15 NOTE — Telephone Encounter (Signed)
lmom tcb x1  Please see AD message in response to pt.'s email   Vivica Dobosz,  Can we prescribe pt Modafinil 200 mg/tab, 1 tab in am #30, no refills. Thanks. Dx is hypersomnia.    [name], MD  05/12/2016, 6:04 PM  [facility]  Pager [phone]  After 3 pm or if no answer, call [phone]

## 2016-05-25 ENCOUNTER — Encounter: Payer: Self-pay | Admitting: Family Medicine

## 2016-05-25 ENCOUNTER — Ambulatory Visit (INDEPENDENT_AMBULATORY_CARE_PROVIDER_SITE_OTHER): Payer: 59 | Admitting: Family Medicine

## 2016-05-25 ENCOUNTER — Telehealth: Payer: Self-pay | Admitting: *Deleted

## 2016-05-25 VITALS — BP 108/80 | HR 69 | Temp 98.6°F | Wt 254.6 lb

## 2016-05-25 DIAGNOSIS — R040 Epistaxis: Secondary | ICD-10-CM

## 2016-05-25 DIAGNOSIS — I10 Essential (primary) hypertension: Secondary | ICD-10-CM

## 2016-05-25 DIAGNOSIS — J209 Acute bronchitis, unspecified: Secondary | ICD-10-CM | POA: Diagnosis not present

## 2016-05-25 DIAGNOSIS — R300 Dysuria: Secondary | ICD-10-CM | POA: Diagnosis not present

## 2016-05-25 DIAGNOSIS — G4733 Obstructive sleep apnea (adult) (pediatric): Secondary | ICD-10-CM | POA: Diagnosis not present

## 2016-05-25 DIAGNOSIS — M1990 Unspecified osteoarthritis, unspecified site: Secondary | ICD-10-CM | POA: Insufficient documentation

## 2016-05-25 HISTORY — DX: Epistaxis: R04.0

## 2016-05-25 HISTORY — DX: Dysuria: R30.0

## 2016-05-25 HISTORY — DX: Acute bronchitis, unspecified: J20.9

## 2016-05-25 LAB — CBC WITH DIFFERENTIAL/PLATELET
Basophils Absolute: 0.1 10*3/uL (ref 0.0–0.1)
Basophils Relative: 1.3 % (ref 0.0–3.0)
Eosinophils Absolute: 0.1 10*3/uL (ref 0.0–0.7)
Eosinophils Relative: 2.2 % (ref 0.0–5.0)
HCT: 37.1 % (ref 36.0–46.0)
Hemoglobin: 12.3 g/dL (ref 12.0–15.0)
Lymphocytes Relative: 47 % — ABNORMAL HIGH (ref 12.0–46.0)
Lymphs Abs: 3.1 10*3/uL (ref 0.7–4.0)
MCHC: 33.1 g/dL (ref 30.0–36.0)
MCV: 87.5 fl (ref 78.0–100.0)
Monocytes Absolute: 0.6 10*3/uL (ref 0.1–1.0)
Monocytes Relative: 9.8 % (ref 3.0–12.0)
Neutro Abs: 2.6 10*3/uL (ref 1.4–7.7)
Neutrophils Relative %: 39.7 % — ABNORMAL LOW (ref 43.0–77.0)
Platelets: 367 10*3/uL (ref 150.0–400.0)
RBC: 4.24 Mil/uL (ref 3.87–5.11)
RDW: 14.3 % (ref 11.5–15.5)
WBC: 6.5 10*3/uL (ref 4.0–10.5)

## 2016-05-25 MED ORDER — METHYLPREDNISOLONE 4 MG PO TABS: ORAL_TABLET | ORAL | 0 refills | Status: DC

## 2016-05-25 MED ORDER — HYDROCODONE-HOMATROPINE 5-1.5 MG/5ML PO SYRP: 5.0000 mL | ORAL_SOLUTION | Freq: Three times a day (TID) | ORAL | 0 refills | Status: DC | PRN

## 2016-05-25 MED ORDER — MUPIROCIN 2 % EX OINT: TOPICAL_OINTMENT | Freq: Two times a day (BID) | CUTANEOUS | Status: DC

## 2016-05-25 MED ORDER — SULFAMETHOXAZOLE-TRIMETHOPRIM 800-160 MG PO TABS: 1.0000 | ORAL_TABLET | Freq: Two times a day (BID) | ORAL | 0 refills | Status: DC

## 2016-05-25 MED FILL — SULFAMETHOXAZOLE/TMP DS TAB: 800-160 | 10 days supply | Qty: 20 | Fill #0

## 2016-05-25 MED FILL — METHYLPREDNISOLONE 4 MG TAB: 4 | 4 days supply | Qty: 15 | Fill #0

## 2016-05-25 MED FILL — HYDROCODONE-HOMATROPINE SYR: 5-1.5 | 8 days supply | Qty: 120 | Fill #0

## 2016-05-25 NOTE — Assessment & Plan Note (Addendum)
Increase water intake and check UA and c & s. Check CBC with diff

## 2016-05-25 NOTE — Progress Notes (Signed)
Subjective:    Patient ID: [name], female    DOB: Mar 31, 1961, 56 y.o.   MRN: [id number]  Chief Complaint  Patient presents with  . Cough    x7 days . Saturday was the worst day. Had a bad coughing episode this morning at 2am. No mucus. Lost her voice this morning.Coughing so much that it has caused her to leak some urine.  . Hypertension    HPI Patient is in today for a cough. Has been coughing for the past seven days . Saturday was the worst day. Had a bad coughing episode this morning at 2am. No mucus. Lost her voice this morning.Coughing so much that it has caused her to leak some urine. No additional concerns noted at this time. Her hoarseness is worse with coughing, she notes epistaxis with CPAP use. Denies CP/palp/HA/fevers/GI or GU c/o. Taking meds as prescribed. Notes some chills and fatigue. She struggles with chronic hoarseness and follows with a speicalist at [facility]. Was improving but this episode has flared her symptoms once more.   I acted as a Education administrator for Penni Homans, MD. [name], [place]   Past Medical History:  Diagnosis Date  . Acute bronchitis 05/25/2016  . Anxiety   . Chronic pain syndrome 01/06/2013  . Chronic rhinosinusitis   . Colon polyps   . Cough 01/06/2013  . Depression   . Diabetes ([place]) 01/06/2013  . Diabetes mellitus type 2 in obese [facility]) 01/06/2013   Sees Dr Calvert Cantor for eye exam Does not see podiatry, foot exam today unremarkable except for thick cracking skin on heals   . Dysuria 05/25/2016  . Edema 01/06/2013  . Epistaxis 05/25/2016  . Fibroid, uterine   . GERD (gastroesophageal reflux disease)   . Glaucoma 12-13  . Hoarseness   . Hoarseness of voice 05/11/2013  . Hyperglycemia 04/13/2013  . Hyperlipemia, mixed 06/06/2007   Qualifier: Diagnosis of  By: Wynona Luna She feels Lipitor caused increased low back pain and weakness    . Hyperlipidemia   . Hypertension   . Hypokalemia 02/05/2013  . IBS (irritable bowel syndrome) 02/13/2011  .  Low back pain 03/03/2013  . Muscle cramp 05/22/2014  . Obesity   . Obesity, unspecified 05/11/2013  . OSA (obstructive sleep apnea)   . Pain in joint, shoulder region 06/27/2015  . Perimenopause 10/05/2012  . Raynaud disease 06/16/2013  . Thyroid disease    hypothyroidism    Past Surgical History:  Procedure Laterality Date  . ABDOMINAL HYSTERECTOMY    . CHOLECYSTECTOMY      Family History  Problem Relation Age of Onset  . Alcohol abuse Father   . Cancer Father     renal and colon  . Hyperlipidemia Father   . Hypertension Father   . Cancer Paternal Grandfather     colon  . Diabetes      Social History   Social History  . Marital status: Significant Other    Spouse name: N/A  . Number of children: N/A  . Years of education: N/A   Occupational History  . Not on file.   Social History Main Topics  . Smoking status: Former Smoker    Packs/day: 0.50    Years: 30.00    Types: Cigarettes    Quit date: 04/25/2003  . Smokeless tobacco: Never Used     Comment: smoked since age 49  . Alcohol use No  . Drug use: Unknown  . Sexual activity: Not on file  Other Topics Concern  . Not on file   Social History Narrative   Endo-- Dr Dwyane Dee   ENT--Dr shoemaker   GI--Dr Buccini   Pulm--Dr Clance   Rheum--Dr Charlestine Night          Outpatient Medications Prior to Visit  Medication Sig Dispense Refill  . amLODipine (NORVASC) 5 MG tablet Take 1 tablet (5 mg total) by mouth daily. 90 tablet 1  . fenofibrate micronized (ANTARA) 130 MG capsule Take 1 capsule (130 mg total) by mouth daily before breakfast. 30 capsule 6  . FLUoxetine (PROZAC) 10 MG capsule TAKE 3 CAPSULES BY MOUTH DAILY. (Patient taking differently: take 1 capsule daily) 90 capsule PRN  . fluticasone (FLONASE) 50 MCG/ACT nasal spray Place 2 sprays into both nostrils daily. (Patient not taking: Reported on 04/21/2016) 16 g 6  . furosemide (LASIX) 20 MG tablet TAKE 1-2 TABLETS BY MOUTH DAILY AS NEEDED FOR FLUID OR EDEMA 40  tablet 6  . hyoscyamine (LEVSIN, ANASPAZ) 0.125 MG tablet Take 1 tablet (0.125 mg total) by mouth every 4 (four) hours as needed for cramping. 40 tablet 1  . ipratropium (ATROVENT) 0.06 % nasal spray Place 2 sprays into both nostrils 4 (four) times daily. 15 mL 12  . metFORMIN (GLUCOPHAGE-XR) 500 MG 24 hr tablet TAKE 2 TABLETS BY MOUTH DAILY WITH BREAKFAST. 60 tablet PRN  . modafinil (PROVIGIL) 200 MG tablet Take 1 tablet (200 mg total) by mouth daily. Dx G47.10 30 tablet 0  . Nebivolol HCl (BYSTOLIC) 20 MG TABS Take 1 tablet (20 mg total) by mouth daily. 90 tablet 2  . oxymetazoline (AFRIN) 0.05 % nasal spray Place 1 spray into both nostrils 2 (two) times daily.    Marland Kitchen Spacer/Aero-Holding Dorise Bullion Use as directed 1 each 1  . spironolactone (ALDACTONE) 25 MG tablet Take 1 tablet (25 mg total) by mouth 2 (two) times daily. 60 tablet 6  . SYNTHROID 100 MCG tablet TAKE 1 TABLET BY MOUTH DAILY BEFORE BREAKFAST 30 tablet 5  . TRUEPLUS LANCETS 30G MISC 1 Device by Does not apply route daily as needed. DX 250.00 by Does not apply route daily as needed. DX 250.00 100 each 2  . venlafaxine XR (EFFEXOR-XR) 75 MG 24 hr capsule TAKE 3 CAPSULES BY MOUTH ONCE DAILY WITH BREAKFAST. 90 capsule 3  . VENTOLIN HFA 108 (90 Base) MCG/ACT inhaler INHALE 2 PUFFS INTO THE LUNGS EVERY 4 HOURS AS NEEDED FOR WHEEZING OR SHORTNESS OF BREATH. 18 g 6  . Vitamin D, Ergocalciferol, (DRISDOL) 50000 units CAPS capsule TAKE 1 CAPSULE BY MOUTH EVERY 7 DAYS 12 capsule 1  . Vitamin D, Ergocalciferol, (DRISDOL) 50000 units CAPS capsule TAKE 1 CAPSULE BY MOUTH EVERY 7 DAYS 12 capsule 1  . gabapentin (NEURONTIN) 100 MG capsule TAKE 1 CAPSULE BY MOUTH TWICE DAILY DURING THE DAY 180 capsule 1  . gabapentin (NEURONTIN) 400 MG capsule TAKE 2 CAPSULES BY MOUTH AT BEDTIME. 180 capsule 1   No facility-administered medications prior to visit.     Allergies  Allergen Reactions  . Losartan Cough  . Aspirin     REACTION: Upset stomach  .  Atorvastatin     myalgias  . Codeine   . Amoxicillin Rash  . Erythromycin Rash    Review of Systems  Constitutional: Negative for fever and malaise/fatigue.  HENT: Positive for congestion, sinus pain and sore throat.   Eyes: Negative for blurred vision.  Respiratory: Positive for cough. Negative for shortness of breath.   Cardiovascular: Negative for  chest pain, palpitations and leg swelling.  Gastrointestinal: Negative for vomiting.  Genitourinary: Positive for dysuria and frequency.  Musculoskeletal: Positive for myalgias. Negative for back pain.  Skin: Negative for rash.  Neurological: Negative for loss of consciousness and headaches.       Objective:    Physical Exam  Constitutional: She is oriented to person, place, and time. She appears well-developed and well-nourished. No distress.  HENT:  Head: Normocephalic and atraumatic.  Oropharynx erythematous and slightly edematous.   Eyes: Conjunctivae are normal.  Neck: Normal range of motion. No thyromegaly present.  Cardiovascular: Normal rate and regular rhythm.   Pulmonary/Chest: Effort normal and breath sounds normal. She has no wheezes.  Abdominal: Soft. Bowel sounds are normal. She exhibits no mass. There is no tenderness. There is no rebound and no guarding.  Musculoskeletal: She exhibits no edema or deformity.  Neurological: She is alert and oriented to person, place, and time.  Skin: Skin is warm and dry. She is not diaphoretic.  Psychiatric: She has a normal mood and affect.    BP 108/80 (BP Location: Left Wrist, Patient Position: Sitting, Cuff Size: Large)   Pulse 69   Temp 98.6 F (37 C) (Oral)   Wt 254 lb 9.6 oz (115.5 kg)   SpO2 97% Comment: RA  BMI 43.03 kg/m  Wt Readings from Last 3 Encounters:  05/25/16 254 lb 9.6 oz (115.5 kg)  04/21/16 258 lb 3.2 oz (117.1 kg)  04/10/16 260 lb 3.2 oz (118 kg)     Lab Results  Component Value Date   WBC 6.5 05/25/2016   HGB 12.3 05/25/2016   HCT 37.1  05/25/2016   PLT 367.0 05/25/2016   GLUCOSE 96 04/04/2016   CHOL 201 (H) 04/04/2016   TRIG 128.0 04/04/2016   HDL 44.20 04/04/2016   LDLDIRECT 160.8 01/30/2014   LDLCALC 132 (H) 04/04/2016   ALT 24 04/04/2016   AST 21 04/04/2016   NA 142 04/04/2016   K 4.0 04/04/2016   CL 107 04/04/2016   CREATININE 1.02 04/04/2016   BUN 11 04/04/2016   CO2 24 04/04/2016   TSH 0.62 04/04/2016   HGBA1C 6.3 04/04/2016   MICROALBUR 1.0 04/04/2016    Lab Results  Component Value Date   TSH 0.62 04/04/2016   Lab Results  Component Value Date   WBC 6.5 05/25/2016   HGB 12.3 05/25/2016   HCT 37.1 05/25/2016   MCV 87.5 05/25/2016   PLT 367.0 05/25/2016   Lab Results  Component Value Date   NA 142 04/04/2016   K 4.0 04/04/2016   CO2 24 04/04/2016   GLUCOSE 96 04/04/2016   BUN 11 04/04/2016   CREATININE 1.02 04/04/2016   BILITOT 0.3 04/04/2016   ALKPHOS 53 04/04/2016   AST 21 04/04/2016   ALT 24 04/04/2016   PROT 7.0 04/04/2016   ALBUMIN 4.4 04/04/2016   CALCIUM 9.7 04/04/2016   GFR 72.26 04/04/2016   Lab Results  Component Value Date   CHOL 201 (H) 04/04/2016   Lab Results  Component Value Date   HDL 44.20 04/04/2016   Lab Results  Component Value Date   LDLCALC 132 (H) 04/04/2016   Lab Results  Component Value Date   TRIG 128.0 04/04/2016   Lab Results  Component Value Date   CHOLHDL 5 04/04/2016   Lab Results  Component Value Date   HGBA1C 6.3 04/04/2016       Assessment & Plan:   Problem List Items Addressed This Visit  OSA (obstructive sleep apnea)   Essential hypertension    Well controlled, no changes to meds. Encouraged heart healthy diet such as the DASH diet and exercise as tolerated.       Relevant Orders   CBC w/Diff (Completed)   Acute bronchitis    Bactrim DS bid x 10 days. Hycodan prn and Medrol dosepak.       Relevant Medications   mupirocin ointment (BACTROBAN) 2 %   Dysuria    Increase water intake and check UA and c & s. Check CBC  with diff      Relevant Orders   Urinalysis (Completed)   Urine Culture (Completed)   Epistaxis - Primary    Hydrate better and Mupirocin ointment qhs prn      Relevant Medications   mupirocin ointment (BACTROBAN) 2 %      I have discontinued Ms. Gaultney's gabapentin and gabapentin. I am also having her start on HYDROcodone-homatropine and sulfamethoxazole-trimethoprim. Additionally, I am having her maintain her TRUEPLUS LANCETS 30G, hyoscyamine, Spacer/Aero-Holding Chambers, ipratropium, FLUoxetine, metFORMIN, VENTOLIN HFA, SYNTHROID, fenofibrate micronized, venlafaxine XR, spironolactone, amLODipine, furosemide, fluticasone, oxymetazoline, Vitamin D (Ergocalciferol), Nebivolol HCl, Vitamin D (Ergocalciferol), and modafinil. We will continue to administer mupirocin ointment.  Meds ordered this encounter  Medications  . HYDROcodone-homatropine (HYCODAN) 5-1.5 MG/5ML syrup    Sig: Take 5 mLs by mouth every 8 (eight) hours as needed for cough.    Dispense:  120 mL    Refill:  0  . sulfamethoxazole-trimethoprim (BACTRIM DS) 800-160 MG tablet    Sig: Take 1 tablet by mouth 2 (two) times daily.    Dispense:  20 tablet    Refill:  0  . DISCONTD: methylPREDNISolone (MEDROL) 4 MG tablet    Sig: 5 tab po qd X 1d then 4 tab po qd X 1d then 3 tab po qd X 1d then 2 tab po qd then 1 tab po qd    Dispense:  40 tablet    Refill:  0  . mupirocin ointment (BACTROBAN) 2 %    CMA served as scribe during this visit. History, Physical and Plan performed by medical provider. Documentation and orders reviewed and attested to.  Penni Homans, MD

## 2016-05-25 NOTE — Progress Notes (Signed)
Pre visit review using our clinic review tool, if applicable. No additional management support is needed unless otherwise documented below in the visit note. 

## 2016-05-25 NOTE — Patient Instructions (Signed)

## 2016-05-25 NOTE — Assessment & Plan Note (Signed)
Well controlled, no changes to meds. Encouraged heart healthy diet such as the DASH diet and exercise as tolerated.  °

## 2016-05-25 NOTE — Assessment & Plan Note (Signed)
Bactrim DS bid x 10 days. Hycodan prn and Medrol dosepak.

## 2016-05-25 NOTE — Telephone Encounter (Signed)
Caller: Audrea Muscat from [place]  Reason for call: Medrol order was sent in w/ instructions totaling 15 tablets, but was sent in for 40 tablets. Discussed w/ Dr. Charlett Blake, correct dispense quantity is 15 tablets. Audrea Muscat notified and chart updated to reflect.

## 2016-05-25 NOTE — Progress Notes (Signed)
Patient ID: [name], female   DOB: September 25, 1960, 56 y.o.   MRN: [id number]

## 2016-05-25 NOTE — Assessment & Plan Note (Signed)
Using CPAP regularly, has noted some epistaxis intermittently since starting CPAP, given Mupirocin ointment to apply to nares qhs prn

## 2016-05-25 NOTE — Assessment & Plan Note (Signed)
Hydrate better and Mupirocin ointment qhs prn

## 2016-05-26 LAB — URINALYSIS
Bilirubin Urine: NEGATIVE
Ketones, ur: NEGATIVE
Leukocytes, UA: NEGATIVE
Nitrite: NEGATIVE
Specific Gravity, Urine: 1.01 (ref 1.000–1.030)
Total Protein, Urine: NEGATIVE
Urine Glucose: NEGATIVE
Urobilinogen, UA: 0.2 (ref 0.0–1.0)
pH: 6 (ref 5.0–8.0)

## 2016-05-27 LAB — URINE CULTURE

## 2016-05-30 ENCOUNTER — Encounter: Payer: Self-pay | Admitting: Family Medicine

## 2016-05-31 MED FILL — FUROSEMIDE 20 MG TABLET: 20 | 20 days supply | Qty: 40 | Fill #3

## 2016-05-31 NOTE — Telephone Encounter (Signed)
Notify the ecoli in her urine is from her colon but in the colon it does not cause trouble only when it gets to the bladder does it cause an infection. She could have another infection in her colon since she is having diarrhea can check stool for CDif, stool culture and stool for ova and parasites and stool for wbc she will have to come in to pick up cups.

## 2016-06-08 ENCOUNTER — Encounter: Payer: Self-pay | Admitting: Pulmonary Disease

## 2016-06-08 ENCOUNTER — Other Ambulatory Visit: Payer: Self-pay | Admitting: Pulmonary Disease

## 2016-06-08 ENCOUNTER — Telehealth: Payer: Self-pay | Admitting: Pulmonary Disease

## 2016-06-08 NOTE — Telephone Encounter (Signed)
   Pt using cpap. She has persistent hypersomnia despite using cpap and sleeping 7 hrs.   Started on modafinil 200 mg/d.  Some better.   Discussed with pt re: side effects of modafinil.  Plan : 1. Plan to increase modafinil to 200 mg/tab, 2 tabs/day (i.e 400 mg/d) for 2 weeks. No refills.    Jasmine : pls call in modafinil. pls tell pt we will try her on the 400 mg/day for 2 weeks.  If not better, we may need to start something else. At that pint, I need to see her.   Thanks.    [name], MD 06/08/2016, 5:02 PM [facility] Pager [phone] After 3 pm or if no answer, call [phone]

## 2016-06-08 NOTE — Telephone Encounter (Signed)
AD  Please Advise  Please see pt. Emails below

## 2016-06-09 MED ORDER — MODAFINIL 200 MG PO TABS: 200.0000 mg | ORAL_TABLET | Freq: Two times a day (BID) | ORAL | 0 refills | Status: DC

## 2016-06-09 MED FILL — MODAFINIL 200 MG TABLET: 200 | 14 days supply | Qty: 28 | Fill #0

## 2016-06-09 MED FILL — METFORMIN HCL ER 500 MG TAB: 500 | 30 days supply | Qty: 60 | Fill #3

## 2016-06-09 NOTE — Telephone Encounter (Signed)
Spoke with pt on the phone and made her aware of [facility] and recc. Pt understood and had no further questions. Her new rx was called into her [facility] of choice which [facility]. Nothing further is needed.

## 2016-06-09 NOTE — Telephone Encounter (Deleted)
error 

## 2016-06-12 DIAGNOSIS — M9905 Segmental and somatic dysfunction of pelvic region: Secondary | ICD-10-CM | POA: Diagnosis not present

## 2016-06-12 DIAGNOSIS — M9902 Segmental and somatic dysfunction of thoracic region: Secondary | ICD-10-CM | POA: Diagnosis not present

## 2016-06-12 DIAGNOSIS — M9903 Segmental and somatic dysfunction of lumbar region: Secondary | ICD-10-CM | POA: Diagnosis not present

## 2016-06-12 DIAGNOSIS — M5136 Other intervertebral disc degeneration, lumbar region: Secondary | ICD-10-CM | POA: Diagnosis not present

## 2016-06-20 MED FILL — SYNTHROID 100 MCG TABLET: 100 | 90 days supply | Qty: 90 | Fill #1

## 2016-06-22 MED FILL — FUROSEMIDE 20 MG TABLET: 20 | 20 days supply | Qty: 40 | Fill #4

## 2016-06-27 ENCOUNTER — Encounter: Payer: Self-pay | Admitting: Pulmonary Disease

## 2016-06-27 ENCOUNTER — Other Ambulatory Visit: Payer: Self-pay | Admitting: Pulmonary Disease

## 2016-06-27 NOTE — Telephone Encounter (Signed)
Sherlean Foot Dios,   Please review pt email and advise, thanks!]  Modafinil Sorry   ----- Message ----- From: CMA Terance Hart R Sent: 06/27/16 15:52 To: Carissa S. Jarmon Subject: RE: Visit Follow-Up Question  Hi Tomeka,  Can you please specify what medication you are referring to, thanks!  ----- Message -----  From: Norwood Levo. Jerline Pain  Sent: 3/6/20183:41 PM EST  To: Rush Landmark, MD Subject: Visit Follow-Up Question  I have fourmore daysof my pills and need a refill call into the [facility]. The400mg  is too muchbut the 200mg  not enough so I br broke one pill in half and I've been taking one whole and a half pill and that seems to work better. I took the 400 like 3 days and it was too much then I started to take one pill and a half and feeling okay.Thanks  [employer]

## 2016-06-29 ENCOUNTER — Other Ambulatory Visit: Payer: Self-pay

## 2016-06-29 MED ORDER — MODAFINIL 100 MG PO TABS: 100.0000 mg | ORAL_TABLET | Freq: Three times a day (TID) | ORAL | 5 refills | Status: DC

## 2016-06-30 ENCOUNTER — Telehealth: Payer: Self-pay | Admitting: Pulmonary Disease

## 2016-06-30 NOTE — Telephone Encounter (Signed)
Patient is returning phone call. Patient request to call her at work [phone].

## 2016-06-30 NOTE — Telephone Encounter (Signed)
Spoke with pt, states she is having difficulty filling her modafinil at [facility] will not fill this for her. Spoke with [facility], states that Modafinil is requiring a PA.  Form faxed to office stating that there is a quantity limit of 2 tablets daily of 100mg  tablets- insurance will pay for 200mg  tab to be taken 1.5 tabs daily to equal out to 300mg  daily.  AD ok to change rx, or do you wish to proceed with PA for 100mg  tabs? (PA form is being held in [place] cubby outside of triage room)

## 2016-06-30 NOTE — Telephone Encounter (Signed)
lmtcb x1 for pt. 

## 2016-07-03 NOTE — Telephone Encounter (Signed)
AD   Please see previous message. Insurance will not pay for how we sent in her new rx.

## 2016-07-03 NOTE — Telephone Encounter (Signed)
   If insurance will cover 200 mg/tab, 1 1/2 tab daily, pls order # 45 with 3 refills if possible.  If not, let me know if I have to do PA.  Thanks.   [name], MD 07/03/2016, 8:54 PM [facility] Pager [phone] After 3 pm or if no answer, call [phone]

## 2016-07-03 NOTE — Telephone Encounter (Signed)
See phone note from 3.9.18.

## 2016-07-04 MED ORDER — MODAFINIL 200 MG PO TABS: ORAL_TABLET | ORAL | 3 refills | Status: DC

## 2016-07-04 MED FILL — MODAFINIL 200 MG TABLET: 200 | 30 days supply | Qty: 45 | Fill #0

## 2016-07-04 NOTE — Telephone Encounter (Signed)
Contacted pharmacy and called in pts new rx.  Nothing further is needed will send an email to pt making her aware as well. Nothing further is needed

## 2016-07-05 DIAGNOSIS — Z1231 Encounter for screening mammogram for malignant neoplasm of breast: Secondary | ICD-10-CM | POA: Diagnosis not present

## 2016-07-05 DIAGNOSIS — Z6841 Body Mass Index (BMI) 40.0 and over, adult: Secondary | ICD-10-CM | POA: Diagnosis not present

## 2016-07-05 DIAGNOSIS — Z01419 Encounter for gynecological examination (general) (routine) without abnormal findings: Secondary | ICD-10-CM | POA: Diagnosis not present

## 2016-07-11 ENCOUNTER — Other Ambulatory Visit (INDEPENDENT_AMBULATORY_CARE_PROVIDER_SITE_OTHER): Payer: 59

## 2016-07-11 DIAGNOSIS — E1169 Type 2 diabetes mellitus with other specified complication: Secondary | ICD-10-CM

## 2016-07-11 DIAGNOSIS — E782 Mixed hyperlipidemia: Secondary | ICD-10-CM | POA: Diagnosis not present

## 2016-07-11 DIAGNOSIS — I10 Essential (primary) hypertension: Secondary | ICD-10-CM | POA: Diagnosis not present

## 2016-07-11 DIAGNOSIS — E669 Obesity, unspecified: Secondary | ICD-10-CM | POA: Diagnosis not present

## 2016-07-11 LAB — LIPID PANEL
Cholesterol: 181 mg/dL (ref 0–200)
HDL: 39 mg/dL — ABNORMAL LOW (ref >39.00)
NonHDL: 141.66
Total CHOL/HDL Ratio: 5
Triglycerides: 259 mg/dL — ABNORMAL HIGH (ref 0.0–149.0)
VLDL: 51.8 mg/dL — ABNORMAL HIGH (ref 0.0–40.0)

## 2016-07-11 LAB — COMPREHENSIVE METABOLIC PANEL
ALT: 23 U/L (ref 0–35)
AST: 20 U/L (ref 0–37)
Albumin: 4.2 g/dL (ref 3.5–5.2)
Alkaline Phosphatase: 72 U/L (ref 39–117)
BUN: 12 mg/dL (ref 6–23)
CO2: 27 mEq/L (ref 19–32)
Calcium: 9.7 mg/dL (ref 8.4–10.5)
Chloride: 105 mEq/L (ref 96–112)
Creatinine, Ser: 0.79 mg/dL (ref 0.40–1.20)
GFR: 96.95 mL/min (ref >60.00)
Glucose, Bld: 88 mg/dL (ref 70–99)
Potassium: 3.8 mEq/L (ref 3.5–5.1)
Sodium: 140 mEq/L (ref 135–145)
Total Bilirubin: 0.3 mg/dL (ref 0.2–1.2)
Total Protein: 6.8 g/dL (ref 6.0–8.3)

## 2016-07-11 LAB — CBC
HCT: 40.2 % (ref 36.0–46.0)
Hemoglobin: 13.5 g/dL (ref 12.0–15.0)
MCHC: 33.6 g/dL (ref 30.0–36.0)
MCV: 86.8 fl (ref 78.0–100.0)
Platelets: 333 10*3/uL (ref 150.0–400.0)
RBC: 4.63 Mil/uL (ref 3.87–5.11)
RDW: 14.6 % (ref 11.5–15.5)
WBC: 4.6 10*3/uL (ref 4.0–10.5)

## 2016-07-11 LAB — LDL CHOLESTEROL, DIRECT: Direct LDL: 103 mg/dL

## 2016-07-11 LAB — HEMOGLOBIN A1C: Hgb A1c MFr Bld: 6.2 % (ref 4.6–6.5)

## 2016-07-11 LAB — TSH: TSH: 0.89 u[IU]/mL (ref 0.35–4.50)

## 2016-07-11 MED FILL — FUROSEMIDE 20 MG TABLET: 20 | 20 days supply | Qty: 40 | Fill #5

## 2016-07-11 MED FILL — SPIRONOLACTONE 25 MG TABLET: 25 | 90 days supply | Qty: 180 | Fill #2

## 2016-07-11 MED FILL — METFORMIN HCL ER 500 MG TAB: 500 | 30 days supply | Qty: 60 | Fill #4

## 2016-07-17 ENCOUNTER — Encounter: Payer: Self-pay | Admitting: Family Medicine

## 2016-07-17 ENCOUNTER — Ambulatory Visit (INDEPENDENT_AMBULATORY_CARE_PROVIDER_SITE_OTHER): Payer: 59 | Admitting: Family Medicine

## 2016-07-17 VITALS — BP 120/78 | HR 74 | Temp 97.9°F | Resp 18 | Wt 249.4 lb

## 2016-07-17 DIAGNOSIS — E559 Vitamin D deficiency, unspecified: Secondary | ICD-10-CM

## 2016-07-17 DIAGNOSIS — E1169 Type 2 diabetes mellitus with other specified complication: Secondary | ICD-10-CM

## 2016-07-17 DIAGNOSIS — M25512 Pain in left shoulder: Secondary | ICD-10-CM | POA: Diagnosis not present

## 2016-07-17 DIAGNOSIS — I10 Essential (primary) hypertension: Secondary | ICD-10-CM | POA: Diagnosis not present

## 2016-07-17 DIAGNOSIS — E038 Other specified hypothyroidism: Secondary | ICD-10-CM | POA: Diagnosis not present

## 2016-07-17 DIAGNOSIS — E669 Obesity, unspecified: Secondary | ICD-10-CM

## 2016-07-17 DIAGNOSIS — M9903 Segmental and somatic dysfunction of lumbar region: Secondary | ICD-10-CM | POA: Diagnosis not present

## 2016-07-17 DIAGNOSIS — E6609 Other obesity due to excess calories: Secondary | ICD-10-CM

## 2016-07-17 DIAGNOSIS — M9905 Segmental and somatic dysfunction of pelvic region: Secondary | ICD-10-CM | POA: Diagnosis not present

## 2016-07-17 DIAGNOSIS — E782 Mixed hyperlipidemia: Secondary | ICD-10-CM | POA: Diagnosis not present

## 2016-07-17 DIAGNOSIS — G8929 Other chronic pain: Secondary | ICD-10-CM | POA: Diagnosis not present

## 2016-07-17 DIAGNOSIS — M9902 Segmental and somatic dysfunction of thoracic region: Secondary | ICD-10-CM | POA: Diagnosis not present

## 2016-07-17 DIAGNOSIS — M5136 Other intervertebral disc degeneration, lumbar region: Secondary | ICD-10-CM | POA: Diagnosis not present

## 2016-07-17 MED ORDER — METFORMIN HCL ER 500 MG PO TB24: ORAL_TABLET | ORAL | 99 refills | Status: DC

## 2016-07-17 MED ORDER — SPIRONOLACTONE 50 MG PO TABS: 50.0000 mg | ORAL_TABLET | Freq: Every day | ORAL | 5 refills | Status: DC

## 2016-07-17 NOTE — Progress Notes (Signed)
Subjective:  I acted as a Education administrator for Dr. Charlett Blake. [place], [place]      Patient ID: [name], female    DOB: [birth year], 56 y.o.   MRN: [id number]  Chief Complaint  Patient presents with  . Follow-up  . Hyperlipidemia  . Diabetes    HPI  Patient is in today for 3 month follow up. Patient complains of Left shoulder pain and discomfort. She denies any falls or injuries. She notes it is bad with prolonged use. She notes numbness and myalgias radiating down left arm. She is pleased with her weight loss and her improved blood sugars. She is engaging with her [facility] app regularly. No polyuria or polydipsia. Denies CP/palp/SOB/HA/congestion/fevers/GI or GU c/o. Taking meds as prescribed  Patient Care Team: Mosie Lukes, MD as PCP - General (Family Medicine) Molli Posey, MD as Consulting Physician (Obstetrics and Gynecology) Virgina Evener, OD as Referring Physician (Optometry)   Past Medical History:  Diagnosis Date  . Acute bronchitis 05/25/2016  . Anxiety   . Chronic pain syndrome 01/06/2013  . Chronic rhinosinusitis   . Colon polyps   . Cough 01/06/2013  . Depression   . Diabetes ([place]) 01/06/2013  . Diabetes mellitus type 2 in obese [facility]) 01/06/2013   Sees Dr Calvert Cantor for eye exam Does not see podiatry, foot exam today unremarkable except for thick cracking skin on heals   . Dysuria 05/25/2016  . Edema 01/06/2013  . Epistaxis 05/25/2016  . Fibroid, uterine   . GERD (gastroesophageal reflux disease)   . Glaucoma 12-13  . Hoarseness   . Hoarseness of voice 05/11/2013  . Hyperglycemia 04/13/2013  . Hyperlipemia, mixed 06/06/2007   Qualifier: Diagnosis of  By: Wynona Luna She feels Lipitor caused increased low back pain and weakness    . Hyperlipidemia   . Hypertension   . Hypokalemia 02/05/2013  . IBS (irritable bowel syndrome) 02/13/2011  . Low back pain 03/03/2013  . Muscle cramp 05/22/2014  . Obesity   . Obesity, unspecified 05/11/2013  . OSA (obstructive sleep  apnea)   . Pain in joint, shoulder region 06/27/2015  . Perimenopause 10/05/2012  . Raynaud disease 06/16/2013  . Thyroid disease    hypothyroidism    Past Surgical History:  Procedure Laterality Date  . ABDOMINAL HYSTERECTOMY    . CHOLECYSTECTOMY      Family History  Problem Relation Age of Onset  . Alcohol abuse Father   . Cancer Father     renal and colon  . Hyperlipidemia Father   . Hypertension Father   . Cancer Paternal Grandfather     colon  . Diabetes      Social History   Social History  . Marital status: Significant Other    Spouse name: N/A  . Number of children: N/A  . Years of education: N/A   Occupational History  . Not on file.   Social History Main Topics  . Smoking status: Former Smoker    Packs/day: 0.50    Years: 30.00    Types: Cigarettes    Quit date: 04/25/2003  . Smokeless tobacco: Never Used     Comment: smoked since age 39  . Alcohol use No  . Drug use: Unknown  . Sexual activity: Not on file   Other Topics Concern  . Not on file   Social History Narrative   Endo-- Dr Dwyane Dee   ENT--Dr shoemaker   GI--Dr [place]   Pulm--Dr Clance   Rheum--Dr Charlestine Night  Outpatient Medications Prior to Visit  Medication Sig Dispense Refill  . fenofibrate micronized (ANTARA) 130 MG capsule Take 1 capsule (130 mg total) by mouth daily before breakfast. 30 capsule 6  . furosemide (LASIX) 20 MG tablet TAKE 1-2 TABLETS BY MOUTH DAILY AS NEEDED FOR FLUID OR EDEMA 40 tablet 6  . modafinil (PROVIGIL) 200 MG tablet Take 1.5 tabs daily 45 tablet 3  . Nebivolol HCl (BYSTOLIC) 20 MG TABS Take 1 tablet (20 mg total) by mouth daily. 90 tablet 2  . SYNTHROID 100 MCG tablet TAKE 1 TABLET BY MOUTH DAILY BEFORE BREAKFAST 30 tablet 5  . VENTOLIN HFA 108 (90 Base) MCG/ACT inhaler INHALE 2 PUFFS INTO THE LUNGS EVERY 4 HOURS AS NEEDED FOR WHEEZING OR SHORTNESS OF BREATH. 18 g 6  . Vitamin D, Ergocalciferol, (DRISDOL) 50000 units CAPS capsule TAKE 1 CAPSULE BY MOUTH  EVERY 7 DAYS 12 capsule 1  . amLODipine (NORVASC) 5 MG tablet Take 1 tablet (5 mg total) by mouth daily. 90 tablet 1  . FLUoxetine (PROZAC) 10 MG capsule TAKE 3 CAPSULES BY MOUTH DAILY. (Patient taking differently: take 1 capsule daily) 90 capsule PRN  . fluticasone (FLONASE) 50 MCG/ACT nasal spray Place 2 sprays into both nostrils daily. (Patient not taking: Reported on 04/21/2016) 16 g 6  . HYDROcodone-homatropine (HYCODAN) 5-1.5 MG/5ML syrup Take 5 mLs by mouth every 8 (eight) hours as needed for cough. 120 mL 0  . hyoscyamine (LEVSIN, ANASPAZ) 0.125 MG tablet Take 1 tablet (0.125 mg total) by mouth every 4 (four) hours as needed for cramping. 40 tablet 1  . ipratropium (ATROVENT) 0.06 % nasal spray Place 2 sprays into both nostrils 4 (four) times daily. 15 mL 12  . metFORMIN (GLUCOPHAGE-XR) 500 MG 24 hr tablet TAKE 2 TABLETS BY MOUTH DAILY WITH BREAKFAST. 60 tablet PRN  . methylPREDNISolone (MEDROL) 4 MG tablet 5 tab po qd X 1d then 4 tab po qd X 1d then 3 tab po qd X 1d then 2 tab po qd then 1 tab po qd 15 tablet 0  . modafinil (PROVIGIL) 100 MG tablet Take 1 tablet (100 mg total) by mouth 3 (three) times daily. 90 tablet 5  . modafinil (PROVIGIL) 200 MG tablet Take 1 tablet (200 mg total) by mouth 2 (two) times daily. Dx G47.10 28 tablet 0  . oxymetazoline (AFRIN) 0.05 % nasal spray Place 1 spray into both nostrils 2 (two) times daily.    Marland Kitchen Spacer/Aero-Holding Dorise Bullion Use as directed 1 each 1  . spironolactone (ALDACTONE) 25 MG tablet Take 1 tablet (25 mg total) by mouth 2 (two) times daily. 60 tablet 6  . sulfamethoxazole-trimethoprim (BACTRIM DS) 800-160 MG tablet Take 1 tablet by mouth 2 (two) times daily. 20 tablet 0  . TRUEPLUS LANCETS 30G MISC 1 Device by Does not apply route daily as needed. DX 250.00 by Does not apply route daily as needed. DX 250.00 100 each 2  . venlafaxine XR (EFFEXOR-XR) 75 MG 24 hr capsule TAKE 3 CAPSULES BY MOUTH ONCE DAILY WITH BREAKFAST. 90 capsule 3  .  Vitamin D, Ergocalciferol, (DRISDOL) 50000 units CAPS capsule TAKE 1 CAPSULE BY MOUTH EVERY 7 DAYS 12 capsule 1   Facility-Administered Medications Prior to Visit  Medication Dose Route Frequency Provider Last Rate Last Dose  . mupirocin ointment (BACTROBAN) 2 %   Nasal BID Mosie Lukes, MD        Allergies  Allergen Reactions  . Losartan Cough  . Aspirin  REACTION: Upset stomach  . Atorvastatin     myalgias  . Codeine   . Amoxicillin Rash  . Erythromycin Rash    Review of Systems  Constitutional: Positive for malaise/fatigue. Negative for fever.  HENT: Negative for congestion.   Eyes: Negative for blurred vision.  Respiratory: Negative for shortness of breath.   Cardiovascular: Negative for chest pain, palpitations and leg swelling.  Gastrointestinal: Negative for abdominal pain, blood in stool and nausea.  Genitourinary: Negative for dysuria and frequency.  Musculoskeletal: Positive for joint pain and neck pain. Negative for falls.  Skin: Negative for rash.  Neurological: Negative for dizziness, loss of consciousness and headaches.  Endo/Heme/Allergies: Negative for environmental allergies.  Psychiatric/Behavioral: Negative for depression. The patient is not nervous/anxious.        Objective:    Physical Exam  Constitutional: She is oriented to person, place, and time. She appears well-developed and well-nourished. No distress.  HENT:  Head: Normocephalic and atraumatic.  Nose: Nose normal.  Eyes: Right eye exhibits no discharge. Left eye exhibits no discharge.  Neck: Normal range of motion. Neck supple.  Cardiovascular: Normal rate and regular rhythm.   No murmur heard. Pulmonary/Chest: Effort normal and breath sounds normal.  Abdominal: Soft. Bowel sounds are normal. There is no tenderness.  Musculoskeletal: She exhibits no edema.  Neurological: She is alert and oriented to person, place, and time.  Skin: Skin is warm and dry.  Psychiatric: She has a normal  mood and affect.  Nursing note and vitals reviewed.   BP 120/78 (BP Location: Left Arm, Patient Position: Sitting, Cuff Size: Normal)   Pulse 74   Temp 97.9 F (36.6 C) (Oral)   Resp 18   Wt 249 lb 6.4 oz (113.1 kg)   SpO2 98%   BMI 42.15 kg/m  Wt Readings from Last 3 Encounters:  07/17/16 249 lb 6.4 oz (113.1 kg)  05/25/16 254 lb 9.6 oz (115.5 kg)  04/21/16 258 lb 3.2 oz (117.1 kg)   BP Readings from Last 3 Encounters:  07/17/16 120/78  05/25/16 108/80  04/21/16 (!) 98/56     Immunization History  Administered Date(s) Administered  . Influenza Whole 01/23/2008, 01/28/2009, 02/16/2010  . Influenza,inj,Quad PF,36+ Mos 01/06/2013, 01/10/2016  . Influenza-Unspecified 01/22/2014, 02/09/2015  . Pneumococcal Conjugate-13 05/22/2014  . Pneumococcal Polysaccharide-23 03/22/2015  . Td 12/18/2000  . Tdap 02/10/2014    Health Maintenance  Topic Date Due  . OPHTHALMOLOGY EXAM  02/04/2014  . FOOT EXAM  08/19/2016  . HEMOGLOBIN A1C  01/11/2017  . URINE MICROALBUMIN  04/04/2017  . MAMMOGRAM  06/27/2017  . PAP SMEAR  06/29/2018  . PNEUMOCOCCAL POLYSACCHARIDE VACCINE (2) 03/21/2020  . COLONOSCOPY  04/05/2022  . TETANUS/TDAP  02/11/2024  . INFLUENZA VACCINE  Completed  . Hepatitis C Screening  Completed  . HIV Screening  Completed    Lab Results  Component Value Date   WBC 4.6 07/11/2016   HGB 13.5 07/11/2016   HCT 40.2 07/11/2016   PLT 333.0 07/11/2016   GLUCOSE 88 07/11/2016   CHOL 181 07/11/2016   TRIG 259.0 (H) 07/11/2016   HDL 39.00 (L) 07/11/2016   LDLDIRECT 103.0 07/11/2016   LDLCALC 132 (H) 04/04/2016   ALT 23 07/11/2016   AST 20 07/11/2016   NA 140 07/11/2016   K 3.8 07/11/2016   CL 105 07/11/2016   CREATININE 0.79 07/11/2016   BUN 12 07/11/2016   CO2 27 07/11/2016   TSH 0.89 07/11/2016   HGBA1C 6.2 07/11/2016   MICROALBUR 1.0  04/04/2016    Lab Results  Component Value Date   TSH 0.89 07/11/2016   Lab Results  Component Value Date   WBC 4.6  07/11/2016   HGB 13.5 07/11/2016   HCT 40.2 07/11/2016   MCV 86.8 07/11/2016   PLT 333.0 07/11/2016   Lab Results  Component Value Date   NA 140 07/11/2016   K 3.8 07/11/2016   CO2 27 07/11/2016   GLUCOSE 88 07/11/2016   BUN 12 07/11/2016   CREATININE 0.79 07/11/2016   BILITOT 0.3 07/11/2016   ALKPHOS 72 07/11/2016   AST 20 07/11/2016   ALT 23 07/11/2016   PROT 6.8 07/11/2016   ALBUMIN 4.2 07/11/2016   CALCIUM 9.7 07/11/2016   GFR 96.95 07/11/2016   Lab Results  Component Value Date   CHOL 181 07/11/2016   Lab Results  Component Value Date   HDL 39.00 (L) 07/11/2016   Lab Results  Component Value Date   LDLCALC 132 (H) 04/04/2016   Lab Results  Component Value Date   TRIG 259.0 (H) 07/11/2016   Lab Results  Component Value Date   CHOLHDL 5 07/11/2016   Lab Results  Component Value Date   HGBA1C 6.2 07/11/2016         Assessment & Plan:   Problem List Items Addressed This Visit    Hypothyroidism    On Levothyroxine, continue to monitor      Relevant Orders   TSH   Hyperlipemia, mixed     encouraged heart healthy diet, avoid trans fats, minimize simple carbs and saturated fats. Increase exercise as tolerated      Relevant Medications   spironolactone (ALDACTONE) 50 MG tablet   Other Relevant Orders   Lipid panel   Essential hypertension - Primary    Well controlled, no changes to meds. Encouraged heart healthy diet such as the DASH diet and exercise as tolerated.       Relevant Medications   spironolactone (ALDACTONE) 50 MG tablet   Other Relevant Orders   Ambulatory referral to Sports Medicine   CBC   Comprehensive metabolic panel   TSH   RESOLVED: Obesity    Encouraged DASH diet, decrease po intake and increase exercise as tolerated. Needs 7-8 hours of sleep nightly. Avoid trans fats, eat small, frequent meals every 4-5 hours with lean proteins, complex carbs and healthy fats. Minimize simple carbs, has had good weight loss using the  [facility] app, can consider the bariatric program as well      Relevant Medications   metFORMIN (GLUCOPHAGE-XR) 500 MG 24 hr tablet   Vitamin D deficiency    Continue Vitamin D supplements.       Diabetes mellitus type 2 in obese (HCC)   Relevant Medications   metFORMIN (GLUCOPHAGE-XR) 500 MG 24 hr tablet   Other Relevant Orders   Hemoglobin A1c   Left shoulder pain    Encouraged moist heat and gentle stretching as tolerated. May try NSAIDs and prescription meds as directed and report if symptoms worsen or seek immediate care, try topical treatments as well. Referred to sports med for further treatment      Relevant Orders   Ambulatory referral to Sports Medicine   Morbid obesity ([place])    Encouraged DASH diet, decrease po intake and increase exercise as tolerated. Needs 7-8 hours of sleep nightly. Avoid trans fats, eat small, frequent meals every 4-5 hours with lean proteins, complex carbs and healthy fats. Minimize simple carbs, bariatric referral  Relevant Medications   metFORMIN (GLUCOPHAGE-XR) 500 MG 24 hr tablet      I have discontinued Ms. Dowse TRUEPLUS LANCETS 30G, hyoscyamine, Spacer/Aero-Holding Chambers, ipratropium, FLUoxetine, venlafaxine XR, spironolactone, amLODipine, fluticasone, oxymetazoline, HYDROcodone-homatropine, sulfamethoxazole-trimethoprim, and methylPREDNISolone. I am also having her start on spironolactone. Additionally, I am having her maintain her VENTOLIN HFA, SYNTHROID, fenofibrate micronized, furosemide, Nebivolol HCl, Vitamin D (Ergocalciferol), modafinil, and metFORMIN. We will continue to administer mupirocin ointment.  Meds ordered this encounter  Medications  . spironolactone (ALDACTONE) 50 MG tablet    Sig: Take 1 tablet (50 mg total) by mouth daily.    Dispense:  30 tablet    Refill:  5  . metFORMIN (GLUCOPHAGE-XR) 500 MG 24 hr tablet    Sig: TAKE 2 TABLETS BY MOUTH DAILY WITH BREAKFAST.    Dispense:  60 tablet    Refill:  PRN     CMA served as scribe during this visit. History, Physical and Plan performed by medical provider. Documentation and orders reviewed and attested to.  Penni Homans, MD

## 2016-07-17 NOTE — Progress Notes (Signed)
Pre visit review using our clinic review tool, if applicable. No additional management support is needed unless otherwise documented below in the visit note. 

## 2016-07-17 NOTE — Assessment & Plan Note (Signed)
Well controlled, no changes to meds. Encouraged heart healthy diet such as the DASH diet and exercise as tolerated.  °

## 2016-07-17 NOTE — Assessment & Plan Note (Signed)
Encouraged DASH diet, decrease po intake and increase exercise as tolerated. Needs 7-8 hours of sleep nightly. Avoid trans fats, eat small, frequent meals every 4-5 hours with lean proteins, complex carbs and healthy fats. Minimize simple carbs, bariatric referral 

## 2016-07-17 NOTE — Assessment & Plan Note (Signed)
encouraged heart healthy diet, avoid trans fats, minimize simple carbs and saturated fats. Increase exercise as tolerated 

## 2016-07-17 NOTE — Assessment & Plan Note (Signed)
On Levothyroxine, continue to monitor 

## 2016-07-17 NOTE — Patient Instructions (Signed)
Carbohydrate Counting for Diabetes Mellitus, Adult Carbohydrate counting is a method for keeping track of how many carbohydrates you eat. Eating carbohydrates naturally increases the amount of sugar (glucose) in the blood. Counting how many carbohydrates you eat helps keep your blood glucose within normal limits, which helps you manage your diabetes (diabetes mellitus). It is important to know how many carbohydrates you can safely have in each meal. This is different for every person. A diet and nutrition specialist (registered dietitian) can help you make a meal plan and calculate how many carbohydrates you should have at each meal and snack. Carbohydrates are found in the following foods:  Grains, such as breads and cereals.  Dried beans and soy products.  Starchy vegetables, such as potatoes, peas, and corn.  Fruit and fruit juices.  Milk and yogurt.  Sweets and snack foods, such as cake, cookies, candy, chips, and soft drinks. How do I count carbohydrates? There are two ways to count carbohydrates in food. You can use either of the methods or a combination of both. Reading "Nutrition Facts" on packaged food  The "Nutrition Facts" list is included on the labels of almost all packaged foods and beverages in the [place] It includes:  The serving size.  Information about nutrients in each serving, including the grams (g) of carbohydrate per serving. To use the "Nutrition Facts":  Decide how many servings you will have.  Multiply the number of servings by the number of carbohydrates per serving.  The resulting number is the total amount of carbohydrates that you will be having. Learning standard serving sizes of other foods  When you eat foods containing carbohydrates that are not packaged or do not include "Nutrition Facts" on the label, you need to measure the servings in order to count the amount of carbohydrates:  Measure the foods that you will eat with a food scale or measuring  cup, if needed.  Decide how many standard-size servings you will eat.  Multiply the number of servings by 15. Most carbohydrate-rich foods have about 15 g of carbohydrates per serving.  For example, if you eat 8 oz (170 g) of strawberries, you will have eaten 2 servings and 30 g of carbohydrates (2 servings x 15 g = 30 g).  For foods that have more than one food mixed, such as soups and casseroles, you must count the carbohydrates in each food that is included. The following list contains standard serving sizes of common carbohydrate-rich foods. Each of these servings has about 15 g of carbohydrates:   hamburger bun or  English muffin.   oz (15 mL) syrup.   oz (14 g) jelly.  1 slice of bread.  1 six-inch tortilla.  3 oz (85 g) cooked rice or pasta.  4 oz (113 g) cooked dried beans.  4 oz (113 g) starchy vegetable, such as peas, corn, or potatoes.  4 oz (113 g) hot cereal.  4 oz (113 g) mashed potatoes or  of a large baked potato.  4 oz (113 g) canned or frozen fruit.  4 oz (120 mL) fruit juice.  4-6 crackers.  6 chicken nuggets.  6 oz (170 g) unsweetened dry cereal.  6 oz (170 g) plain fat-free yogurt or yogurt sweetened with artificial sweeteners.  8 oz (240 mL) milk.  8 oz (170 g) fresh fruit or one small piece of fruit.  24 oz (680 g) popped popcorn. Example of carbohydrate counting Sample meal  3 oz (85 g) chicken breast.  6 oz (  170 g) brown rice.  4 oz (113 g) corn.  8 oz (240 mL) milk.  8 oz (170 g) strawberries with sugar-free whipped topping. Carbohydrate calculation 1. Identify the foods that contain carbohydrates:  Rice.  Corn.  Milk.  Strawberries. 2. Calculate how many servings you have of each food:  2 servings rice.  1 serving corn.  1 serving milk.  1 serving strawberries. 3. Multiply each number of servings by 15 g:  2 servings rice x 15 g = 30 g.  1 serving corn x 15 g = 15 g.  1 serving milk x 15 g = 15  g.  1 serving strawberries x 15 g = 15 g. 4. Add together all of the amounts to find the total grams of carbohydrates eaten:  30 g + 15 g + 15 g + 15 g = 75 g of carbohydrates total. This information is not intended to replace advice given to you by your health care provider. Make sure you discuss any questions you have with your health care provider. Document Released: 04/10/2005 Document Revised: 10/29/2015 Document Reviewed: 09/22/2015 Elsevier Interactive Patient Education  2017 [employer]  

## 2016-07-21 NOTE — Assessment & Plan Note (Signed)
Encouraged moist heat and gentle stretching as tolerated. May try NSAIDs and prescription meds as directed and report if symptoms worsen or seek immediate care, try topical treatments as well. Referred to sports med for further treatment

## 2016-07-21 NOTE — Assessment & Plan Note (Signed)
Continue Vitamin D supplements

## 2016-07-21 NOTE — Assessment & Plan Note (Signed)
Encouraged DASH diet, decrease po intake and increase exercise as tolerated. Needs 7-8 hours of sleep nightly. Avoid trans fats, eat small, frequent meals every 4-5 hours with lean proteins, complex carbs and healthy fats. Minimize simple carbs, has had good weight loss using the Well smith app, can consider the bariatric program as well

## 2016-07-24 ENCOUNTER — Ambulatory Visit (INDEPENDENT_AMBULATORY_CARE_PROVIDER_SITE_OTHER): Payer: 59 | Admitting: Sports Medicine

## 2016-07-24 ENCOUNTER — Encounter: Payer: Self-pay | Admitting: Sports Medicine

## 2016-07-24 VITALS — BP 136/82 | HR 77 | Ht 64.5 in | Wt 248.6 lb

## 2016-07-24 DIAGNOSIS — E1169 Type 2 diabetes mellitus with other specified complication: Secondary | ICD-10-CM

## 2016-07-24 DIAGNOSIS — M25512 Pain in left shoulder: Secondary | ICD-10-CM | POA: Diagnosis not present

## 2016-07-24 DIAGNOSIS — I10 Essential (primary) hypertension: Secondary | ICD-10-CM

## 2016-07-24 DIAGNOSIS — M4722 Other spondylosis with radiculopathy, cervical region: Secondary | ICD-10-CM | POA: Diagnosis not present

## 2016-07-24 DIAGNOSIS — G4733 Obstructive sleep apnea (adult) (pediatric): Secondary | ICD-10-CM | POA: Diagnosis not present

## 2016-07-24 DIAGNOSIS — E669 Obesity, unspecified: Secondary | ICD-10-CM | POA: Diagnosis not present

## 2016-07-24 MED ORDER — PREDNISONE 20 MG PO TABS: 20.0000 mg | ORAL_TABLET | Freq: Every day | ORAL | 0 refills | Status: DC

## 2016-07-24 MED ORDER — PREGABALIN 75 MG PO CAPS: 75.0000 mg | ORAL_CAPSULE | Freq: Two times a day (BID) | ORAL | 2 refills | Status: DC

## 2016-07-24 MED FILL — predniSONE 20 MG TABS: 20 | 7 days supply | Qty: 7 | Fill #0

## 2016-07-24 MED FILL — LYRICA 75 MG CAPSULE: 75 | 30 days supply | Qty: 60 | Fill #0

## 2016-07-24 MED FILL — VIT D2 1.25 MG (50,000 UNIT: 1.25 MG | 84 days supply | Qty: 12 | Fill #1

## 2016-07-24 NOTE — Progress Notes (Signed)
OFFICE VISIT NOTE [name]. Cydnee Fuquay, [place] at [place] - 56 y.o. female MRN [id number]  Date of birth: 12-27-1960  Visit Date: 07/24/2016  PCP: Penni Homans, MD   Referred by: Mosie Lukes, MD  SUBJECTIVE:   Chief Complaint  Patient presents with  . pain in shoulder    left shoulder pain, had MRI 06/22/15 and rotator cuff repair in 2017. She had pain and numbness in left arm and shoulder x 3 weeks. The pain has spread to the left side of back. The numbness has spread to her fingers. She saw a chiropractor last week and got no relief. She says that the pain is sharp with movement of head/neck. She has tried ice and heat with some relief.  Pt was having some chest pain before but no SOB, facial drooping, slurred speach.    HPI: As above. Additional pertinent information includes:  Prior issues with neck pain with now worsening symptoms over the past several weeks.  He is recently titrated off the gabapentin due to daytime somnolence.  She is started on Provigil due to this.  The symptoms radiate from her neck into her hand.  She describes this as a numbness and tingling.  No associated weakness.  Denies fevers, chills, recent weight gain or weight loss.  No night sweats. No significant nighttime awakenings due to this issue. 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  No effect at work.  She has multiple underlying medical issues including diabetes and hypertension.  Otherwise well controlled.   ROS: ROS  Otherwise per HPI.  HISTORY & PERTINENT PRIOR DATA:  No specialty comments available. She reports that she quit smoking about 13 years ago. Her smoking use included Cigarettes. She has a 15.00 pack-year smoking history. She has never used smokeless tobacco.  Recent Labs  09/24/15 0926 04/04/16 1013 07/11/16 0953  HGBA1C 6.3 6.3 6.2    Medications & Allergies reviewed per EMR Patient Active Problem List   Diagnosis Date Noted  . Acute bronchitis 05/25/2016  . Dysuria 05/25/2016  . Epistaxis 05/25/2016  . Hypersomnia with sleep apnea 02/18/2016  . Morbid obesity ([place]) 02/18/2016  . Thyroid activity decreased 07/30/2015  . Left shoulder pain 06/27/2015  . Myalgia 03/27/2015  . Muscle cramp 05/22/2014  . AP (abdominal pain) 02/15/2014  . Raynaud disease 06/16/2013  . Hoarseness of voice 05/11/2013  . Low back pain 03/03/2013  . Hypokalemia 02/05/2013  . Edema 01/06/2013  . Diabetes mellitus type 2 in obese ([place]) 01/06/2013  . Chronic pain syndrome 01/06/2013  . Cough 01/06/2013  . Perimenopause 10/05/2012  . Glaucoma   . Dyspnea 09/10/2011  . IBS (irritable bowel syndrome) 02/13/2011  . Vitamin D deficiency 02/13/2011  . Paraesthesia of skin 08/31/2010  . RHINITIS, CHRONIC 01/14/2010  . THYROMEGALY 11/11/2007  . Allergic rhinitis 11/11/2007  . ECZEMA, HANDS 07/22/2007  . Hypothyroidism 06/06/2007  . Hyperlipemia, mixed 06/06/2007  . ADJ DISORDER WITH MIXED ANXIETY & DEPRESSED MOOD 06/06/2007  . OSA (obstructive sleep apnea) 06/06/2007  . Essential hypertension 06/06/2007  . GERD 06/06/2007  . COLONIC POLYPS, HX OF 06/06/2007   Past Medical History:  Diagnosis Date  . Acute bronchitis 05/25/2016  . Anxiety   . Chronic pain syndrome 01/06/2013  . Chronic rhinosinusitis   . Colon polyps   . Cough 01/06/2013  . Depression   . Diabetes ([place]) 01/06/2013  .  Diabetes mellitus type 2 in obese [facility]) 01/06/2013   Sees Dr Calvert Cantor for eye exam Does not see podiatry, foot exam today unremarkable except for thick cracking skin on heals   . Dysuria 05/25/2016  . Edema 01/06/2013  . Epistaxis 05/25/2016  . Fibroid, uterine   . GERD (gastroesophageal reflux disease)   . Glaucoma 12-13  . Hoarseness   . Hoarseness of voice 05/11/2013  . Hyperglycemia 04/13/2013  . Hyperlipemia, mixed 06/06/2007   Qualifier:  Diagnosis of  By: Wynona Luna She feels Lipitor caused increased low back pain and weakness    . Hyperlipidemia   . Hypertension   . Hypokalemia 02/05/2013  . IBS (irritable bowel syndrome) 02/13/2011  . Low back pain 03/03/2013  . Muscle cramp 05/22/2014  . Obesity   . Obesity, unspecified 05/11/2013  . OSA (obstructive sleep apnea)   . Pain in joint, shoulder region 06/27/2015  . Perimenopause 10/05/2012  . Raynaud disease 06/16/2013  . Thyroid disease    hypothyroidism   Family History  Problem Relation Age of Onset  . Alcohol abuse Father   . Cancer Father     renal and colon  . Hyperlipidemia Father   . Hypertension Father   . Cancer Paternal Grandfather     colon  . Diabetes     Past Surgical History:  Procedure Laterality Date  . ABDOMINAL HYSTERECTOMY    . CHOLECYSTECTOMY     Social History   Occupational History  . Not on file.   Social History Main Topics  . Smoking status: Former Smoker    Packs/day: 0.50    Years: 30.00    Types: Cigarettes    Quit date: 04/25/2003  . Smokeless tobacco: Never Used     Comment: smoked since age 3  . Alcohol use No  . Drug use: Unknown  . Sexual activity: Not on file    OBJECTIVE:  VS:  HT:5' 4.5" (163.8 cm)   WT:248 lb 9.6 oz (112.8 kg)  BMI:42.1    BP:136/82  HR:77bpm  TEMP: ( )  RESP:96 % Physical Exam  Constitutional: She appears well-developed and well-nourished. She is cooperative.  Non-toxic appearance. No distress.  HENT:  Head: Normocephalic and atraumatic.  Cardiovascular: Intact distal pulses.   Pulmonary/Chest: Effort normal. No accessory muscle usage. No respiratory distress.  Neurological: She is alert. She is not disoriented. She displays normal reflexes. A sensory deficit (Slight esthesia in the left C8 distribution) is present.  Reflex Scores:      Tricep reflexes are 2+ on the right side and 2+ on the left side.      Bicep reflexes are 2+ on the right side and 2+ on the left side.       Brachioradialis reflexes are 2+ on the right side and 2+ on the left side. Negative hoffman's B Mild pain with carpal tunnel compression test on the right, normal on the left.  Skin: Skin is warm, dry and intact. Capillary refill takes less than 2 seconds. No abrasion and no rash noted. No cyanosis. Nails show no clubbing.  Psychiatric: She has a normal mood and affect. Her speech is normal and behavior is normal. Thought content normal. Her mood appears not anxious. Her affect is not inappropriate. She does not exhibit a depressed mood.   Neck:   Well aligned, no significant torticollis  Slight restriction in right-sided sidebending and rotation but minimal.  No significant midline tenderness.   ROM: Flexion: 70 Extension: 60  Right Left  Rotation: 70 80  Sidebending: 20 35   NEURAL TENSION SIGNS Right Left  Brachial Plexus Squeeze:  Non-tender Painful  Arm Squeeze Test:  Non-tender Painful  Spurling's  Compression Test:  Negative/  No radiation Mild pain without radic  Lhermitte's  Compression test:   Negative/  No radiation     IMAGING & PROCEDURES: No results found. Findings:  Prior cervical spine x-rays from 2015 showed multilevel degenerative changes most notably at C5-6 and C6-7.    ASSESSMENT & PLAN:  Visit Diagnoses:  1. Essential hypertension   2. Diabetes mellitus type 2 in obese ([place])   3. Acute pain of left shoulder    Meds:  Meds ordered this encounter  Medications  . pregabalin (LYRICA) 75 MG capsule    Sig: Take 1 capsule (75 mg total) by mouth 2 (two) times daily.    Dispense:  60 capsule    Refill:  2  . predniSONE (DELTASONE) 20 MG tablet    Sig: Take 1 tablet (20 mg total) by mouth daily with breakfast.    Dispense:  7 tablet    Refill:  0    Orders: No orders of the defined types were placed in this encounter.   Follow-up: Return in about 4 weeks (around 08/21/2016).   Otherwise please see problem oriented charting as below.

## 2016-07-24 NOTE — Patient Instructions (Addendum)
We are calling in Lyrica to your pharmacy.  I believe you have a pinched nerve in your neck.

## 2016-07-25 ENCOUNTER — Encounter: Payer: Self-pay | Admitting: Family Medicine

## 2016-07-25 NOTE — Assessment & Plan Note (Signed)
Well-controlled today.  I would like to avoid NSAIDs if possible.

## 2016-07-25 NOTE — Assessment & Plan Note (Addendum)
Symptoms are consistent with a C8 radiculitis.  Possibly some component she is recently been weaned off of her gabapentin and her symptoms correlate with the return of the symptoms.  Pain is located more so in the entire arm and not just the shoulder.  Intrinsic shoulder strength is intact.  We will try low-dose prednisone and Lyrica for the below reasons  If any worsening symptoms consider MRI of the cervical spine for consideration of ESI.

## 2016-07-25 NOTE — Assessment & Plan Note (Signed)
Followed by pulmonology for underlying sleep apnea as well as excessive daytime somnolence.  Recently weaned off of gabapentin and started on Provigil.  Given the recurrence of symptoms I do think she would benefit from being on gabapentinoids and would like to try her on Lyrica to see if this does not affect her somnolence.  I have a low threshold to discontinue this.

## 2016-07-25 NOTE — Assessment & Plan Note (Signed)
We will need to watch sugars with low-dose steroid she reports having done well with this in the past.

## 2016-07-31 ENCOUNTER — Ambulatory Visit: Payer: Self-pay | Admitting: Internal Medicine

## 2016-07-31 MED FILL — FUROSEMIDE 20 MG TABLET: 20 | 20 days supply | Qty: 40 | Fill #6

## 2016-08-01 MED FILL — MODAFINIL 200 MG TABLET: 200 | 30 days supply | Qty: 45 | Fill #1

## 2016-08-09 ENCOUNTER — Encounter: Payer: Self-pay | Admitting: Family Medicine

## 2016-08-10 MED ORDER — FENOFIBRATE MICRONIZED 134 MG PO CAPS: 134.0000 mg | ORAL_CAPSULE | Freq: Every day | ORAL | 3 refills | Status: DC

## 2016-08-10 MED FILL — FENOFIBRATE 134 MG CAPSULE: 134 | 30 days supply | Qty: 30 | Fill #0

## 2016-08-16 DIAGNOSIS — M9902 Segmental and somatic dysfunction of thoracic region: Secondary | ICD-10-CM | POA: Diagnosis not present

## 2016-08-16 DIAGNOSIS — M9905 Segmental and somatic dysfunction of pelvic region: Secondary | ICD-10-CM | POA: Diagnosis not present

## 2016-08-16 DIAGNOSIS — M9903 Segmental and somatic dysfunction of lumbar region: Secondary | ICD-10-CM | POA: Diagnosis not present

## 2016-08-16 DIAGNOSIS — M5136 Other intervertebral disc degeneration, lumbar region: Secondary | ICD-10-CM | POA: Diagnosis not present

## 2016-08-22 ENCOUNTER — Encounter: Payer: Self-pay | Admitting: Family Medicine

## 2016-08-22 MED ORDER — FUROSEMIDE 20 MG PO TABS: ORAL_TABLET | ORAL | 6 refills | Status: DC

## 2016-08-22 MED FILL — FUROSEMIDE 20 MG TABLET: 20 | 20 days supply | Qty: 40 | Fill #0

## 2016-08-23 ENCOUNTER — Encounter: Payer: Self-pay | Admitting: Sports Medicine

## 2016-08-23 ENCOUNTER — Ambulatory Visit (INDEPENDENT_AMBULATORY_CARE_PROVIDER_SITE_OTHER): Payer: 59

## 2016-08-23 ENCOUNTER — Ambulatory Visit (INDEPENDENT_AMBULATORY_CARE_PROVIDER_SITE_OTHER): Payer: 59 | Admitting: Sports Medicine

## 2016-08-23 VITALS — BP 120/80 | HR 75 | Ht 64.5 in | Wt 249.6 lb

## 2016-08-23 DIAGNOSIS — G4710 Hypersomnia, unspecified: Secondary | ICD-10-CM | POA: Diagnosis not present

## 2016-08-23 DIAGNOSIS — M542 Cervicalgia: Secondary | ICD-10-CM

## 2016-08-23 DIAGNOSIS — M4722 Other spondylosis with radiculopathy, cervical region: Secondary | ICD-10-CM

## 2016-08-23 DIAGNOSIS — G4730 Sleep apnea, unspecified: Secondary | ICD-10-CM | POA: Diagnosis not present

## 2016-08-23 DIAGNOSIS — M47812 Spondylosis without myelopathy or radiculopathy, cervical region: Secondary | ICD-10-CM | POA: Diagnosis not present

## 2016-08-23 MED ORDER — PREGABALIN 75 MG PO CAPS: 75.0000 mg | ORAL_CAPSULE | Freq: Every day | ORAL | 1 refills | Status: DC

## 2016-08-23 MED ORDER — PREGABALIN 25 MG PO CAPS: 25.0000 mg | ORAL_CAPSULE | Freq: Every morning | ORAL | 1 refills | Status: DC

## 2016-08-23 MED FILL — LYRICA 25 MG CAPSULE: 25 | 30 days supply | Qty: 30 | Fill #0

## 2016-08-23 MED FILL — LYRICA 75 MG CAPSULE: 75 | 30 days supply | Qty: 30 | Fill #0

## 2016-08-23 NOTE — Assessment & Plan Note (Signed)
Concern for potential C7-C8 radiculitis.  Symptoms were somewhat amenable to corticosteroids by mouth and Lyrica but she is having excessive somnolence and persistence of pain.  Further diagnostic evaluation indicated at this time.  We will plan to follow-up with her after MRI is obtained.

## 2016-08-23 NOTE — Assessment & Plan Note (Signed)
She has had increased somnolence with Lyrica and has discontinued the daytime dose which has been beneficial.  We will try a lower dose of Lyrica in the morning to see if we get some increased benefit given the significant improvement in her pain and discomfort in the shoulder and arm.  If persistent ongoing excessive somnolence will plan to discontinue.

## 2016-08-23 NOTE — Progress Notes (Signed)
OFFICE VISIT NOTE [name], [place] at [place] - 56 y.o. female MRN [id number]  Date of birth: Jun 04, 1960  Visit Date: 08/23/2016  PCP: Penni Homans, MD   Referred by: Mosie Lukes, MD  Jari Sportsman, cma acting as scribe for Dr. Paulla Fore.  SUBJECTIVE:   Chief Complaint  Patient presents with  . Follow-up Left Shoulder Pain   HPI: As below and per problem based documentation when appropriate.  [name] presents as a follow up on Left Shoulder Pain. After completing steroid dose pack she did notice that some of the pain subsided in upper back, lt shoulder and left arm. Pt was started on Lyrica 75mg  1 bid and took as directed x2.5 weeks. She started experiencing daytime sleepiness so d/c for 2 days which caused severe pain to be recurrent. Decided to restart Lyrica 75mg  1qd with some relief. The numbness and tingling sx have not changed in lt arm. When she saw Chiropractor early March she did get temporary relief.     Review of Systems  Constitutional: Negative.   HENT: Negative.   Eyes: Negative.   Respiratory: Negative.   Cardiovascular: Negative.   Gastrointestinal: Negative.   Genitourinary: Negative.   Musculoskeletal: Positive for myalgias.  Skin: Negative.   Neurological: Negative.   Endo/Heme/Allergies: Negative.   Psychiatric/Behavioral: Negative.     Otherwise per HPI.  HISTORY & PERTINENT PRIOR DATA:  No specialty comments available. She reports that she quit smoking about 13 years ago. Her smoking use included Cigarettes. She has a 15.00 pack-year smoking history. She has never used smokeless tobacco.   Recent Labs  09/24/15 0926 04/04/16 1013 07/11/16 0953  HGBA1C 6.3 6.3 6.2   Medications & Allergies reviewed per EMR Patient Active Problem List   Diagnosis Date Noted  . Osteoarthritis of spine with radiculopathy, cervical region 07/24/2016  . Acute bronchitis  05/25/2016  . Dysuria 05/25/2016  . Epistaxis 05/25/2016  . Hypersomnia with sleep apnea 02/18/2016  . Morbid obesity ([place]) 02/18/2016  . Thyroid activity decreased 07/30/2015  . Left shoulder pain 06/27/2015  . Myalgia 03/27/2015  . Muscle cramp 05/22/2014  . AP (abdominal pain) 02/15/2014  . Raynaud disease 06/16/2013  . Hoarseness of voice 05/11/2013  . Low back pain 03/03/2013  . Hypokalemia 02/05/2013  . Edema 01/06/2013  . Diabetes mellitus type 2 in obese ([place]) 01/06/2013  . Chronic pain syndrome 01/06/2013  . Cough 01/06/2013  . Perimenopause 10/05/2012  . Glaucoma   . Dyspnea 09/10/2011  . IBS (irritable bowel syndrome) 02/13/2011  . Vitamin D deficiency 02/13/2011  . Paraesthesia of skin 08/31/2010  . RHINITIS, CHRONIC 01/14/2010  . THYROMEGALY 11/11/2007  . Allergic rhinitis 11/11/2007  . ECZEMA, HANDS 07/22/2007  . Hypothyroidism 06/06/2007  . Hyperlipemia, mixed 06/06/2007  . ADJ DISORDER WITH MIXED ANXIETY & DEPRESSED MOOD 06/06/2007  . OSA (obstructive sleep apnea) 06/06/2007  . Essential hypertension 06/06/2007  . GERD 06/06/2007  . COLONIC POLYPS, HX OF 06/06/2007   Past Medical History:  Diagnosis Date  . Acute bronchitis 05/25/2016  . Anxiety   . Chronic pain syndrome 01/06/2013  . Chronic rhinosinusitis   . Colon polyps   . Cough 01/06/2013  . Depression   . Diabetes ([place]) 01/06/2013  . Diabetes mellitus type 2 in obese [facility]) 01/06/2013   Sees Dr Calvert Cantor for eye exam Does not see podiatry, foot exam today unremarkable except for thick cracking skin on heals   .  Dysuria 05/25/2016  . Edema 01/06/2013  . Epistaxis 05/25/2016  . Fibroid, uterine   . GERD (gastroesophageal reflux disease)   . Glaucoma 12-13  . Hoarseness   . Hoarseness of voice 05/11/2013  . Hyperglycemia 04/13/2013  . Hyperlipemia, mixed 06/06/2007   Qualifier: Diagnosis of  By: Wynona Luna She feels Lipitor caused increased low back pain and weakness    . Hyperlipidemia   .  Hypertension   . Hypokalemia 02/05/2013  . IBS (irritable bowel syndrome) 02/13/2011  . Low back pain 03/03/2013  . Muscle cramp 05/22/2014  . Obesity   . Obesity, unspecified 05/11/2013  . OSA (obstructive sleep apnea)   . Pain in joint, shoulder region 06/27/2015  . Perimenopause 10/05/2012  . Raynaud disease 06/16/2013  . Thyroid disease    hypothyroidism   Family History  Problem Relation Age of Onset  . Alcohol abuse Father   . Cancer Father     renal and colon  . Hyperlipidemia Father   . Hypertension Father   . Cancer Paternal Grandfather     colon  . Diabetes     Past Surgical History:  Procedure Laterality Date  . ABDOMINAL HYSTERECTOMY    . CHOLECYSTECTOMY     Social History   Occupational History  . Not on file.   Social History Main Topics  . Smoking status: Former Smoker    Packs/day: 0.50    Years: 30.00    Types: Cigarettes    Quit date: 04/25/2003  . Smokeless tobacco: Never Used     Comment: smoked since age 17  . Alcohol use No  . Drug use: Unknown  . Sexual activity: Not on file    OBJECTIVE:  VS:  HT:5' 4.5" (163.8 cm)   WT:249 lb 9.6 oz (113.2 kg)  BMI:42.3    BP:120/80  HR:75bpm  TEMP: ( )  RESP:95 % Physical Exam  Constitutional: She appears well-developed and well-nourished. She is cooperative.  Non-toxic appearance. No distress.  HENT:  Head: Normocephalic and atraumatic.  Cardiovascular: Intact distal pulses.   Pulmonary/Chest: No accessory muscle usage. No respiratory distress.  Neurological: She is alert. She is not disoriented. She displays abnormal reflex. She displays no atrophy and no tremor. A sensory deficit is present.  Reflex Scores:      Tricep reflexes are 2+ on the right side and 0 on the left side.      Bicep reflexes are 2+ on the right side and 2+ on the left side.      Brachioradialis reflexes are 2+ on the right side and 2+ on the left side. Negative Hoffman's  Skin: Skin is warm, dry and intact. Capillary refill  takes less than 2 seconds. No abrasion and no rash noted.  Psychiatric: She has a normal mood and affect. Her speech is normal and behavior is normal. Thought content normal.   Back Exam   Comments:  Neck exam reveals limited range of motion.  She has pain with brachial plexus squeeze on the left as well as arm squeeze but this is improved from in the past.  She has a generalized esthesia in the left upper extremity mainly located over the C6, 7 and 8 distribution.       IMAGING & PROCEDURES: Dg Cervical Spine 2 Or 3 Views  Result Date: 08/23/2016 CLINICAL DATA:  Neck pain. Intermittent numbness on left side of neck into left upper extremity. EXAM: CERVICAL SPINE - 2-3 VIEW COMPARISON:  12/09/2013 FINDINGS: Degenerative changes throughout  the lumbar spine with anterior spurring. Mild diffuse facet disease bilaterally. Normal alignment. No fracture. Prevertebral soft tissues are normal. IMPRESSION: Mild degenerative disc and facet disease throughout the cervical spine. No acute findings. No significant change since prior study. Electronically Signed   By: Rolm Baptise M.D.   On: 08/23/2016 09:33   No additional findings.   ASSESSMENT & PLAN:  Visit Diagnoses:  1. Neck pain   2. Osteoarthritis of spine with radiculopathy, cervical region   3. Morbid obesity ([place])   4. Hypersomnia with sleep apnea    Meds:  Meds ordered this encounter  Medications  . pregabalin (LYRICA) 25 MG capsule    Sig: Take 1 capsule (25 mg total) by mouth every morning.    Dispense:  30 capsule    Refill:  1  . pregabalin (LYRICA) 75 MG capsule    Sig: Take 1 capsule (75 mg total) by mouth daily.    Dispense:  30 capsule    Refill:  1    Orders:  Orders Placed This Encounter  Procedures  . DG Cervical Spine 2 or 3 views  . MR Cervical Spine Wo Contrast    Follow-up: Return for MRI review.  Otherwise please see problem oriented charting as below.  CMA/ATC served as Education administrator during this visit. History,  Physical, and Plan performed by medical provider. Documentation and orders reviewed and attested to.      Teresa Coombs, [place] [facility] Physician    08/23/2016 9:50 AM

## 2016-08-23 NOTE — Assessment & Plan Note (Signed)
Patient has noted some increased weight gain while previously on gabapentin.  Since being off of that she has had significant improvement in her weight.  Has not associated Lyrica with any type of increased weight at this time.

## 2016-08-30 MED FILL — MODAFINIL 200 MG TABLET: 200 | 30 days supply | Qty: 45 | Fill #2

## 2016-08-30 MED FILL — BYSTOLIC 20 MG TABLET: 20 | 90 days supply | Qty: 90 | Fill #1

## 2016-09-05 ENCOUNTER — Ambulatory Visit
Admission: RE | Admit: 2016-09-05 | Discharge: 2016-09-05 | Disposition: A | Discharge status: Home / Self Care | Payer: 59 | Source: Ambulatory Visit | Attending: Sports Medicine | Admitting: Sports Medicine

## 2016-09-05 DIAGNOSIS — M4802 Spinal stenosis, cervical region: Secondary | ICD-10-CM | POA: Diagnosis not present

## 2016-09-05 DIAGNOSIS — M542 Cervicalgia: Secondary | ICD-10-CM

## 2016-09-05 DIAGNOSIS — M4722 Other spondylosis with radiculopathy, cervical region: Secondary | ICD-10-CM

## 2016-09-07 MED FILL — FUROSEMIDE 20 MG TABLET: 20 | 20 days supply | Qty: 40 | Fill #1

## 2016-09-08 ENCOUNTER — Encounter: Payer: Self-pay | Admitting: Sports Medicine

## 2016-09-12 DIAGNOSIS — M5136 Other intervertebral disc degeneration, lumbar region: Secondary | ICD-10-CM | POA: Diagnosis not present

## 2016-09-12 DIAGNOSIS — M9903 Segmental and somatic dysfunction of lumbar region: Secondary | ICD-10-CM | POA: Diagnosis not present

## 2016-09-12 DIAGNOSIS — M9905 Segmental and somatic dysfunction of pelvic region: Secondary | ICD-10-CM | POA: Diagnosis not present

## 2016-09-12 DIAGNOSIS — M9902 Segmental and somatic dysfunction of thoracic region: Secondary | ICD-10-CM | POA: Diagnosis not present

## 2016-09-18 ENCOUNTER — Encounter: Payer: Self-pay | Admitting: Family Medicine

## 2016-09-19 ENCOUNTER — Other Ambulatory Visit: Payer: Self-pay | Admitting: Family Medicine

## 2016-09-19 MED ORDER — SYNTHROID 100 MCG PO TABS: 100.0000 ug | ORAL_TABLET | Freq: Every day | ORAL | 0 refills | Status: DC

## 2016-09-19 MED FILL — SYNTHROID 100 MCG TABLET: 100 | 90 days supply | Qty: 90 | Fill #0

## 2016-09-27 MED FILL — MODAFINIL 200 MG TABLET: 200 | 30 days supply | Qty: 45 | Fill #3

## 2016-09-27 MED FILL — FUROSEMIDE 20 MG TABLET: 20 | 20 days supply | Qty: 40 | Fill #2

## 2016-09-27 MED FILL — LYRICA 75 MG CAPSULE: 75 | 30 days supply | Qty: 30 | Fill #1

## 2016-10-06 DIAGNOSIS — R351 Nocturia: Secondary | ICD-10-CM | POA: Diagnosis not present

## 2016-10-06 DIAGNOSIS — N393 Stress incontinence (female) (male): Secondary | ICD-10-CM | POA: Diagnosis not present

## 2016-10-06 DIAGNOSIS — R8271 Bacteriuria: Secondary | ICD-10-CM | POA: Diagnosis not present

## 2016-10-06 DIAGNOSIS — R350 Frequency of micturition: Secondary | ICD-10-CM | POA: Diagnosis not present

## 2016-10-06 DIAGNOSIS — N390 Urinary tract infection, site not specified: Secondary | ICD-10-CM | POA: Diagnosis not present

## 2016-10-11 ENCOUNTER — Encounter: Payer: Self-pay | Admitting: Acute Care

## 2016-10-11 ENCOUNTER — Encounter: Payer: Self-pay | Admitting: Family Medicine

## 2016-10-11 ENCOUNTER — Other Ambulatory Visit (INDEPENDENT_AMBULATORY_CARE_PROVIDER_SITE_OTHER): Payer: 59

## 2016-10-11 DIAGNOSIS — I10 Essential (primary) hypertension: Secondary | ICD-10-CM | POA: Diagnosis not present

## 2016-10-11 DIAGNOSIS — E038 Other specified hypothyroidism: Secondary | ICD-10-CM

## 2016-10-11 DIAGNOSIS — E118 Type 2 diabetes mellitus with unspecified complications: Secondary | ICD-10-CM

## 2016-10-11 DIAGNOSIS — E669 Obesity, unspecified: Secondary | ICD-10-CM | POA: Diagnosis not present

## 2016-10-11 DIAGNOSIS — E1169 Type 2 diabetes mellitus with other specified complication: Secondary | ICD-10-CM

## 2016-10-11 DIAGNOSIS — E782 Mixed hyperlipidemia: Secondary | ICD-10-CM | POA: Diagnosis not present

## 2016-10-11 LAB — CBC
HCT: 39.5 % (ref 36.0–46.0)
Hemoglobin: 13.3 g/dL (ref 12.0–15.0)
MCHC: 33.6 g/dL (ref 30.0–36.0)
MCV: 88.9 fl (ref 78.0–100.0)
Platelets: 359 10*3/uL (ref 150.0–400.0)
RBC: 4.45 Mil/uL (ref 3.87–5.11)
RDW: 14.8 % (ref 11.5–15.5)
WBC: 4.9 10*3/uL (ref 4.0–10.5)

## 2016-10-11 LAB — LIPID PANEL
Cholesterol: 196 mg/dL (ref 0–200)
HDL: 45.7 mg/dL (ref >39.00)
LDL Cholesterol: 132 mg/dL — ABNORMAL HIGH (ref 0–99)
NonHDL: 150.54
Total CHOL/HDL Ratio: 4
Triglycerides: 92 mg/dL (ref 0.0–149.0)
VLDL: 18.4 mg/dL (ref 0.0–40.0)

## 2016-10-11 LAB — COMPREHENSIVE METABOLIC PANEL
ALT: 20 U/L (ref 0–35)
AST: 20 U/L (ref 0–37)
Albumin: 4.4 g/dL (ref 3.5–5.2)
Alkaline Phosphatase: 52 U/L (ref 39–117)
BUN: 12 mg/dL (ref 6–23)
CO2: 25 mEq/L (ref 19–32)
Calcium: 10 mg/dL (ref 8.4–10.5)
Chloride: 106 mEq/L (ref 96–112)
Creatinine, Ser: 0.93 mg/dL (ref 0.40–1.20)
GFR: 80.24 mL/min (ref >60.00)
Glucose, Bld: 88 mg/dL (ref 70–99)
Potassium: 3.7 mEq/L (ref 3.5–5.1)
Sodium: 140 mEq/L (ref 135–145)
Total Bilirubin: 0.3 mg/dL (ref 0.2–1.2)
Total Protein: 7 g/dL (ref 6.0–8.3)

## 2016-10-11 LAB — MICROALBUMIN / CREATININE URINE RATIO
Creatinine,U: 210.4 mg/dL
Microalb Creat Ratio: 0.3 mg/g (ref 0.0–30.0)
Microalb, Ur: 0.7 mg/dL (ref 0.0–1.9)

## 2016-10-11 LAB — HEMOGLOBIN A1C: Hgb A1c MFr Bld: 6.3 % (ref 4.6–6.5)

## 2016-10-11 LAB — TSH: TSH: 1.3 u[IU]/mL (ref 0.35–4.50)

## 2016-10-11 MED ORDER — MODAFINIL 200 MG PO TABS: ORAL_TABLET | ORAL | 1 refills | Status: DC

## 2016-10-11 NOTE — Telephone Encounter (Signed)
Spoke with pt on the phone. Her OV has already been rescheduled. Per SG >> we can refill her prescription until her appointment. Rx has been called in. Nothing further was needed.

## 2016-10-16 ENCOUNTER — Ambulatory Visit: Payer: Self-pay | Admitting: Acute Care

## 2016-10-16 ENCOUNTER — Ambulatory Visit: Payer: Self-pay | Admitting: Family Medicine

## 2016-10-17 DIAGNOSIS — M9905 Segmental and somatic dysfunction of pelvic region: Secondary | ICD-10-CM | POA: Diagnosis not present

## 2016-10-17 DIAGNOSIS — M9902 Segmental and somatic dysfunction of thoracic region: Secondary | ICD-10-CM | POA: Diagnosis not present

## 2016-10-17 DIAGNOSIS — M5136 Other intervertebral disc degeneration, lumbar region: Secondary | ICD-10-CM | POA: Diagnosis not present

## 2016-10-17 DIAGNOSIS — M9903 Segmental and somatic dysfunction of lumbar region: Secondary | ICD-10-CM | POA: Diagnosis not present

## 2016-10-19 MED FILL — FUROSEMIDE 20 MG TABLET: 20 | 20 days supply | Qty: 40 | Fill #3

## 2016-10-19 MED FILL — FENOFIBRATE 134 MG CAPSULE: 134 | 30 days supply | Qty: 30 | Fill #1

## 2016-10-20 ENCOUNTER — Ambulatory Visit: Payer: Self-pay | Admitting: Pulmonary Disease

## 2016-10-27 ENCOUNTER — Other Ambulatory Visit: Payer: Self-pay | Admitting: Family Medicine

## 2016-10-27 MED FILL — MODAFINIL 200 MG TAB: 200 | 30 days supply | Qty: 45 | Fill #0

## 2016-10-30 MED FILL — VIT D2 1.25 MG (50,000 UNIT: 1.25 MG | 84 days supply | Qty: 12 | Fill #0

## 2016-11-09 ENCOUNTER — Other Ambulatory Visit: Payer: Self-pay | Admitting: Family Medicine

## 2016-11-09 ENCOUNTER — Encounter: Payer: Self-pay | Admitting: Family Medicine

## 2016-11-09 MED FILL — FUROSEMIDE 20 MG TABLET: 20 | 20 days supply | Qty: 40 | Fill #4

## 2016-11-10 MED ORDER — SPIRONOLACTONE 50 MG PO TABS: 50.0000 mg | ORAL_TABLET | Freq: Every day | ORAL | 5 refills | Status: DC

## 2016-11-10 MED FILL — SPIRONOLACTONE 50 MG TABLET: 50 | 30 days supply | Qty: 30 | Fill #0

## 2016-11-13 ENCOUNTER — Encounter: Payer: Self-pay | Admitting: Acute Care

## 2016-11-13 ENCOUNTER — Ambulatory Visit (INDEPENDENT_AMBULATORY_CARE_PROVIDER_SITE_OTHER): Payer: 59 | Admitting: Acute Care

## 2016-11-13 DIAGNOSIS — G4733 Obstructive sleep apnea (adult) (pediatric): Secondary | ICD-10-CM

## 2016-11-13 MED ORDER — MODAFINIL 200 MG PO TABS: ORAL_TABLET | ORAL | 3 refills | Status: DC

## 2016-11-13 NOTE — Patient Instructions (Addendum)
It is good to see you again. We will send in a prescription for Modafinil. We will place an order for a new machine as yours is 56 years old. We will place an order for new equiptment Continue on CPAP at bedtime. You appear to be benefiting from the treatment Goal is to wear for at least 6 hours each night for maximal clinical benefit. Continue to work on weight loss, as the link between excess weight  and sleep apnea is well established.  Clean tubing, filter,reservoir once weekly with soap and water. Do not drive if sleepy. Follow up In  6 months with Eric Form, NP or before as needed.  Please contact office for sooner follow up if symptoms do not improve or worsen or seek emergency care

## 2016-11-13 NOTE — Progress Notes (Signed)
I have reviewed and agree with assessment/plan.  Chesley Mires, MD [facility] Pulmonary/Critical Care 11/13/2016, 11:58 AM Pager:  [phone]

## 2016-11-13 NOTE — Progress Notes (Signed)
History of Present Illness [name] is a 56 y.o. female former smoker, quit 2005, with OSA with CPAP use. She is followed by Dr. Corrie Dandy.   11/13/2016 Follow Up CPAP.  Pt.presents for follow up. She has been doing well on her CPAP. She is states that she has really noticed an improvement since starting on Modafinil. She is sleeping 8 hours nightly. She is compliant with device use. She states she has better focus, and better daytime alertness. She works at [employer] in [employer]. She denies fever, chest pain, orthopnea or hemoptysis. She states she sleeps in a recliner as she has rotator cuff problems.This is not due to shortness of breath.She would like to have a new machine if she qualifies as her machine is 56 years old.  Test Results: Down Load: Auto Set 5-20 cm H2O Usage 30/30 days, 100% Average use = 8 hours, 54 minutes Median pressure:10.3 AHI=  0.9   CBC Latest Ref Rng & Units 10/11/2016 07/11/2016 05/25/2016  WBC 4.0 - 10.5 K/uL 4.9 4.6 6.5  Hemoglobin 12.0 - 15.0 g/dL 13.3 13.5 12.3  Hematocrit 36.0 - 46.0 % 39.5 40.2 37.1  Platelets 150.0 - 400.0 K/uL 359.0 333.0 367.0    BMP Latest Ref Rng & Units 10/11/2016 07/11/2016 04/04/2016  Glucose 70 - 99 mg/dL 88 88 96  BUN 6 - 23 mg/dL 12 12 11   Creatinine 0.40 - 1.20 mg/dL 0.93 0.79 1.02  Sodium 135 - 145 mEq/L 140 140 142  Potassium 3.5 - 5.1 mEq/L 3.7 3.8 4.0  Chloride 96 - 112 mEq/L 106 105 107  CO2 19 - 32 mEq/L 25 27 24   Calcium 8.4 - 10.5 mg/dL 10.0 9.7 9.7     PFT    Component Value Date/Time   FEV1PRE 2.24 02/25/2016 1100   FEV1POST 2.36 02/25/2016 1100   FVCPRE 2.63 02/25/2016 1100   FVCPOST 2.67 02/25/2016 1100   TLC 4.02 02/25/2016 1100   DLCOUNC 18.63 02/25/2016 1100   PREFEV1FVCRT 85 02/25/2016 1100   PSTFEV1FVCRT 88 02/25/2016 1100      Past medical hx Past Medical History:  Diagnosis Date  . Acute bronchitis 05/25/2016  . Anxiety   . Chronic pain syndrome 01/06/2013  . Chronic  rhinosinusitis   . Colon polyps   . Cough 01/06/2013  . Depression   . Diabetes ([place]) 01/06/2013  . Diabetes mellitus type 2 in obese [facility]) 01/06/2013   Sees Dr Calvert Cantor for eye exam Does not see podiatry, foot exam today unremarkable except for thick cracking skin on heals   . Dysuria 05/25/2016  . Edema 01/06/2013  . Epistaxis 05/25/2016  . Fibroid, uterine   . GERD (gastroesophageal reflux disease)   . Glaucoma 12-13  . Hoarseness   . Hoarseness of voice 05/11/2013  . Hyperglycemia 04/13/2013  . Hyperlipemia, mixed 06/06/2007   Qualifier: Diagnosis of  By: Wynona Luna She feels Lipitor caused increased low back pain and weakness    . Hyperlipidemia   . Hypertension   . Hypokalemia 02/05/2013  . IBS (irritable bowel syndrome) 02/13/2011  . Low back pain 03/03/2013  . Muscle cramp 05/22/2014  . Obesity   . Obesity, unspecified 05/11/2013  . OSA (obstructive sleep apnea)   . Pain in joint, shoulder region 06/27/2015  . Perimenopause 10/05/2012  . Raynaud disease 06/16/2013  . Thyroid disease    hypothyroidism     Social History  Substance Use Topics  . Smoking status: Former Smoker  Packs/day: 0.50    Years: 30.00    Types: Cigarettes    Quit date: 04/25/2003  . Smokeless tobacco: Never Used     Comment: smoked since age 34  . Alcohol use No    Tobacco Cessation: Former smoker quit 2005  Past surgical hx, Family hx, Social hx all reviewed.  Current Outpatient Prescriptions on File Prior to Visit  Medication Sig  . fenofibrate micronized (LOFIBRA) 134 MG capsule Take 1 capsule (134 mg total) by mouth daily before breakfast.  . furosemide (LASIX) 20 MG tablet Take 1-2 tablets by mouth daily as needed for fluid or edema  . metFORMIN (GLUCOPHAGE-XR) 500 MG 24 hr tablet TAKE 2 TABLETS BY MOUTH DAILY WITH BREAKFAST.  . modafinil (PROVIGIL) 200 MG tablet Take 1.5 tabs daily  . Nebivolol HCl (BYSTOLIC) 20 MG TABS Take 1 tablet (20 mg total) by mouth daily.  Marland Kitchen  spironolactone (ALDACTONE) 50 MG tablet Take 1 tablet (50 mg total) by mouth daily.  Marland Kitchen SYNTHROID 100 MCG tablet TAKE 1 TABLET BY MOUTH DAILY BEFORE BREAKFAST  . VENTOLIN HFA 108 (90 Base) MCG/ACT inhaler INHALE 2 PUFFS INTO THE LUNGS EVERY 4 HOURS AS NEEDED FOR WHEEZING OR SHORTNESS OF BREATH.  Marland Kitchen Vitamin D, Ergocalciferol, (DRISDOL) 50000 units CAPS capsule TAKE 1 CAPSULE BY MOUTH EVERY 7 DAYS  . pregabalin (LYRICA) 25 MG capsule Take 1 capsule (25 mg total) by mouth every morning. (Patient not taking: Reported on 11/13/2016)  . pregabalin (LYRICA) 75 MG capsule Take 1 capsule (75 mg total) by mouth daily. (Patient not taking: Reported on 11/13/2016)   Current Facility-Administered Medications on File Prior to Visit  Medication  . mupirocin ointment (BACTROBAN) 2 %     Allergies  Allergen Reactions  . Losartan Cough  . Aspirin     REACTION: Upset stomach  . Atorvastatin     myalgias  . Codeine   . Amoxicillin Rash  . Erythromycin Rash    Review Of Systems:  Constitutional:   No  weight loss, night sweats,  Fevers, chills, fatigue, or  lassitude.  HEENT:   No headaches,  Difficulty swallowing,  Tooth/dental problems, or  Sore throat,                No sneezing, itching, ear ache, +nasal congestion, post nasal drip,   CV:  No chest pain,  Orthopnea, PND, swelling in lower extremities, anasarca, dizziness, palpitations, syncope.   GI  No heartburn, indigestion, abdominal pain, nausea, vomiting, diarrhea, change in bowel habits, loss of appetite, bloody stools.   Resp: No shortness of breath with exertion or at rest.  No excess mucus, no productive cough,  No non-productive cough,  No coughing up of blood.  No change in color of mucus.  No wheezing.  No chest wall deformity  Skin: no rash or lesions.  GU: no dysuria, change in color of urine, no urgency or frequency.  No flank pain, no hematuria   MS:  No joint pain or swelling.  No decreased range of motion.  No back  pain.  Psych:  No change in mood or affect. No depression or anxiety.  No memory loss.   Vital Signs BP 136/90 (BP Location: Left Arm, Patient Position: Sitting, Cuff Size: Normal)   Pulse 74   Ht 5' 4.5" (1.638 m)   Wt 245 lb 12.8 oz (111.5 kg)   SpO2 99%   BMI 41.54 kg/m    Physical Exam:  General- No distress,  A&Ox3, pleasant ENT:  No sinus tenderness, TM clear, pale nasal mucosa, no oral exudate,no post nasal drip, no LAN Cardiac: S1, S2, regular rate and rhythm, no murmur Chest: No wheeze/ rales/ dullness; no accessory muscle use, no nasal flaring, no sternal retractions Abd.: Soft Non-tender, obese Ext: No clubbing cyanosis, edema Neuro:  normal strength Skin: No rashes, warm and dry Psych: normal mood and behavior   Assessment/Plan  OSA (obstructive sleep apnea) Excellent compliance with CPAP Auto Set 5-20 cm H2O Plan: We will send in a prescription for Modafinil. We will place an order for a new machine as yours is 56 years old. We will place an order for new equiptment Continue on CPAP at bedtime. You appear to be benefiting from the treatment Goal is to wear for at least 6 hours each night for maximal clinical benefit. Continue to work on weight loss, as the link between excess weight  and sleep apnea is well established.  Clean tubing, filter,reservoir once weekly with soap and water. Do not drive if sleepy. Follow up In  6 months with Eric Form, NP or before as needed.  Please contact office for sooner follow up if symptoms do not improve or worsen or seek emergency care      Magdalen Spatz, NP 11/13/2016  9:44 AM

## 2016-11-13 NOTE — Assessment & Plan Note (Signed)
Excellent compliance with CPAP Auto Set 5-20 cm H2O Plan: We will send in a prescription for Modafinil. We will place an order for a new machine as yours is 56 years old. We will place an order for new equiptment Continue on CPAP at bedtime. You appear to be benefiting from the treatment Goal is to wear for at least 6 hours each night for maximal clinical benefit. Continue to work on weight loss, as the link between excess weight  and sleep apnea is well established.  Clean tubing, filter,reservoir once weekly with soap and water. Do not drive if sleepy. Follow up In  6 months with Eric Form, NP or before as needed.  Please contact office for sooner follow up if symptoms do not improve or worsen or seek emergency care

## 2016-11-13 NOTE — Addendum Note (Signed)
Addended by: Tyson Dense on: 11/13/2016 10:36 AM   Modules accepted: Orders

## 2016-11-22 DIAGNOSIS — I10 Essential (primary) hypertension: Secondary | ICD-10-CM | POA: Diagnosis not present

## 2016-11-22 DIAGNOSIS — G4733 Obstructive sleep apnea (adult) (pediatric): Secondary | ICD-10-CM | POA: Diagnosis not present

## 2016-11-23 MED FILL — FENOFIBRATE 134 MG CAPSULE: 134 | 30 days supply | Qty: 30 | Fill #2

## 2016-11-27 DIAGNOSIS — M5136 Other intervertebral disc degeneration, lumbar region: Secondary | ICD-10-CM | POA: Diagnosis not present

## 2016-11-27 DIAGNOSIS — M9902 Segmental and somatic dysfunction of thoracic region: Secondary | ICD-10-CM | POA: Diagnosis not present

## 2016-11-27 DIAGNOSIS — M9903 Segmental and somatic dysfunction of lumbar region: Secondary | ICD-10-CM | POA: Diagnosis not present

## 2016-11-27 DIAGNOSIS — M9905 Segmental and somatic dysfunction of pelvic region: Secondary | ICD-10-CM | POA: Diagnosis not present

## 2016-11-29 MED FILL — BYSTOLIC 20 MG TABLET: 20 | 90 days supply | Qty: 90 | Fill #2

## 2016-11-29 MED FILL — MODAFINIL 200 MG TAB: 200 | 30 days supply | Qty: 45 | Fill #1

## 2016-11-29 MED FILL — FUROSEMIDE 20 MG TABLET: 20 | 20 days supply | Qty: 40 | Fill #5

## 2016-11-30 ENCOUNTER — Ambulatory Visit (INDEPENDENT_AMBULATORY_CARE_PROVIDER_SITE_OTHER): Payer: 59 | Admitting: Family Medicine

## 2016-11-30 DIAGNOSIS — E1169 Type 2 diabetes mellitus with other specified complication: Secondary | ICD-10-CM

## 2016-11-30 DIAGNOSIS — E038 Other specified hypothyroidism: Secondary | ICD-10-CM | POA: Diagnosis not present

## 2016-11-30 DIAGNOSIS — I10 Essential (primary) hypertension: Secondary | ICD-10-CM | POA: Diagnosis not present

## 2016-11-30 DIAGNOSIS — E559 Vitamin D deficiency, unspecified: Secondary | ICD-10-CM

## 2016-11-30 DIAGNOSIS — E669 Obesity, unspecified: Secondary | ICD-10-CM

## 2016-11-30 DIAGNOSIS — E782 Mixed hyperlipidemia: Secondary | ICD-10-CM

## 2016-11-30 NOTE — Patient Instructions (Signed)
DASH Eating Plan DASH stands for "Dietary Approaches to Stop Hypertension." The DASH eating plan is a healthy eating plan that has been shown to reduce high blood pressure (hypertension). It may also reduce your risk for type 2 diabetes, heart disease, and stroke. The DASH eating plan may also help with weight loss. What are tips for following this plan? General guidelines  Avoid eating more than 2,300 mg (milligrams) of salt (sodium) a day. If you have hypertension, you may need to reduce your sodium intake to 1,500 mg a day.  Limit alcohol intake to no more than 1 drink a day for nonpregnant women and 2 drinks a day for men. One drink equals 12 oz of beer, 5 oz of wine, or 1 oz of hard liquor.  Work with your health care provider to maintain a healthy body weight or to lose weight. Ask what an ideal weight is for you.  Get at least 30 minutes of exercise that causes your heart to beat faster (aerobic exercise) most days of the week. Activities may include walking, swimming, or biking.  Work with your health care provider or diet and nutrition specialist (dietitian) to adjust your eating plan to your individual calorie needs. Reading food labels  Check food labels for the amount of sodium per serving. Choose foods with less than 5 percent of the Daily Value of sodium. Generally, foods with less than 300 mg of sodium per serving fit into this eating plan.  To find whole grains, look for the word "whole" as the first word in the ingredient list. Shopping  Buy products labeled as "low-sodium" or "no salt added."  Buy fresh foods. Avoid canned foods and premade or frozen meals. Cooking  Avoid adding salt when cooking. Use salt-free seasonings or herbs instead of table salt or sea salt. Check with your health care provider or pharmacist before using salt substitutes.  Do not fry foods. Cook foods using healthy methods such as baking, boiling, grilling, and broiling instead.  Cook with  heart-healthy oils, such as olive, canola, soybean, or sunflower oil. Meal planning   Eat a balanced diet that includes: ? 5 or more servings of fruits and vegetables each day. At each meal, try to fill half of your plate with fruits and vegetables. ? Up to 6-8 servings of whole grains each day. ? Less than 6 oz of lean meat, poultry, or fish each day. A 3-oz serving of meat is about the same size as a deck of cards. One egg equals 1 oz. ? 2 servings of low-fat dairy each day. ? A serving of nuts, seeds, or beans 5 times each week. ? Heart-healthy fats. Healthy fats called Omega-3 fatty acids are found in foods such as flaxseeds and coldwater fish, like sardines, salmon, and mackerel.  Limit how much you eat of the following: ? Canned or prepackaged foods. ? Food that is high in trans fat, such as fried foods. ? Food that is high in saturated fat, such as fatty meat. ? Sweets, desserts, sugary drinks, and other foods with added sugar. ? Full-fat dairy products.  Do not salt foods before eating.  Try to eat at least 2 vegetarian meals each week.  Eat more home-cooked food and less restaurant, buffet, and fast food.  When eating at a restaurant, ask that your food be prepared with less salt or no salt, if possible. What foods are recommended? The items listed may not be a complete list. Talk with your dietitian about what   dietary choices are best for you. Grains Whole-grain or whole-wheat bread. Whole-grain or whole-wheat pasta. Brown rice. Oatmeal. Quinoa. Bulgur. Whole-grain and low-sodium cereals. Pita bread. Low-fat, low-sodium crackers. Whole-wheat flour tortillas. Vegetables Fresh or frozen vegetables (raw, steamed, roasted, or grilled). Low-sodium or reduced-sodium tomato and vegetable juice. Low-sodium or reduced-sodium tomato sauce and tomato paste. Low-sodium or reduced-sodium canned vegetables. Fruits All fresh, dried, or frozen fruit. Canned fruit in natural juice (without  added sugar). Meat and other protein foods Skinless chicken or turkey. Ground chicken or turkey. Pork with fat trimmed off. Fish and seafood. Egg whites. Dried beans, peas, or lentils. Unsalted nuts, nut butters, and seeds. Unsalted canned beans. Lean cuts of beef with fat trimmed off. Low-sodium, lean deli meat. Dairy Low-fat (1%) or fat-free (skim) milk. Fat-free, low-fat, or reduced-fat cheeses. Nonfat, low-sodium ricotta or cottage cheese. Low-fat or nonfat yogurt. Low-fat, low-sodium cheese. Fats and oils Soft margarine without trans fats. Vegetable oil. Low-fat, reduced-fat, or light mayonnaise and salad dressings (reduced-sodium). Canola, safflower, olive, soybean, and sunflower oils. Avocado. Seasoning and other foods Herbs. Spices. Seasoning mixes without salt. Unsalted popcorn and pretzels. Fat-free sweets. What foods are not recommended? The items listed may not be a complete list. Talk with your dietitian about what dietary choices are best for you. Grains Baked goods made with fat, such as croissants, muffins, or some breads. Dry pasta or rice meal packs. Vegetables Creamed or fried vegetables. Vegetables in a cheese sauce. Regular canned vegetables (not low-sodium or reduced-sodium). Regular canned tomato sauce and paste (not low-sodium or reduced-sodium). Regular tomato and vegetable juice (not low-sodium or reduced-sodium). Pickles. Olives. Fruits Canned fruit in a light or heavy syrup. Fried fruit. Fruit in cream or butter sauce. Meat and other protein foods Fatty cuts of meat. Ribs. Fried meat. Bacon. Sausage. Bologna and other processed lunch meats. Salami. Fatback. Hotdogs. Bratwurst. Salted nuts and seeds. Canned beans with added salt. Canned or smoked fish. Whole eggs or egg yolks. Chicken or turkey with skin. Dairy Whole or 2% milk, cream, and half-and-half. Whole or full-fat cream cheese. Whole-fat or sweetened yogurt. Full-fat cheese. Nondairy creamers. Whipped toppings.  Processed cheese and cheese spreads. Fats and oils Butter. Stick margarine. Lard. Shortening. Ghee. Bacon fat. Tropical oils, such as coconut, palm kernel, or palm oil. Seasoning and other foods Salted popcorn and pretzels. Onion salt, garlic salt, seasoned salt, table salt, and sea salt. Worcestershire sauce. Tartar sauce. Barbecue sauce. Teriyaki sauce. Soy sauce, including reduced-sodium. Steak sauce. Canned and packaged gravies. Fish sauce. Oyster sauce. Cocktail sauce. Horseradish that you find on the shelf. Ketchup. Mustard. Meat flavorings and tenderizers. Bouillon cubes. Hot sauce and Tabasco sauce. Premade or packaged marinades. Premade or packaged taco seasonings. Relishes. Regular salad dressings. Where to find more information:  National Heart, Lung, and Blood Institute: www.nhlbi.nih.gov  American Heart Association: www.heart.org Summary  The DASH eating plan is a healthy eating plan that has been shown to reduce high blood pressure (hypertension). It may also reduce your risk for type 2 diabetes, heart disease, and stroke.  With the DASH eating plan, you should limit salt (sodium) intake to 2,300 mg a day. If you have hypertension, you may need to reduce your sodium intake to 1,500 mg a day.  When on the DASH eating plan, aim to eat more fresh fruits and vegetables, whole grains, lean proteins, low-fat dairy, and heart-healthy fats.  Work with your health care provider or diet and nutrition specialist (dietitian) to adjust your eating plan to your individual   calorie needs. This information is not intended to replace advice given to you by your health care provider. Make sure you discuss any questions you have with your health care provider. Document Released: 03/30/2011 Document Revised: 04/03/2016 Document Reviewed: 04/03/2016 Elsevier Interactive Patient Education  2017 [employer]  

## 2016-11-30 NOTE — Assessment & Plan Note (Signed)
On Levothyroxine, continue to monitor 

## 2016-11-30 NOTE — Assessment & Plan Note (Signed)
Encouraged heart healthy diet, increase exercise, avoid trans fats, consider a krill oil cap daily 

## 2016-11-30 NOTE — Assessment & Plan Note (Signed)
aking daily supplements

## 2016-11-30 NOTE — Assessment & Plan Note (Signed)
Well controlled, no changes to meds. Encouraged heart healthy diet such as the DASH diet and exercise as tolerated.  °

## 2016-11-30 NOTE — Assessment & Plan Note (Signed)
Encouraged DASH diet, decrease po intake and increase exercise as tolerated. Needs 7-8 hours of sleep nightly. Avoid trans fats, eat small, frequent meals every 4-5 hours with lean proteins, complex carbs and healthy fats. Minimize simple carbs, bariatric referral placed 

## 2016-11-30 NOTE — Progress Notes (Signed)
Subjective:  I acted as a Education administrator for Dr. Charlett Blake. [place], [place]  Patient ID: [name], female    DOB: [birth year], 56 y.o.   MRN: [id number]  No chief complaint on file.   HPI  Patient is in today for a 3 month follow up. Patient is following up on her HTn, DM and other medical concerns. Patient has no acute concerns today. No recent febrile illness or acute hospitalizations. Denies CP/palp/SOB/HA/congestion/fevers/GI or GU c/o. Taking meds as prescribed. Has been under a great deal of stress but is managing. She acknowledges she is not eating as well as she should. Does not endorse polyuria or polydipsia.    Patient Care Team: Mosie Lukes, MD as PCP - General (Family Medicine) Molli Posey, MD as Consulting Physician (Obstetrics and Gynecology) Virgina Evener, OD as Referring Physician (Optometry)   Past Medical History:  Diagnosis Date  . Acute bronchitis 05/25/2016  . Anxiety   . Chronic pain syndrome 01/06/2013  . Chronic rhinosinusitis   . Colon polyps   . Cough 01/06/2013  . Depression   . Diabetes ([place]) 01/06/2013  . Diabetes mellitus type 2 in obese [facility]) 01/06/2013   Sees Dr Calvert Cantor for eye exam Does not see podiatry, foot exam today unremarkable except for thick cracking skin on heals   . Dysuria 05/25/2016  . Edema 01/06/2013  . Epistaxis 05/25/2016  . Fibroid, uterine   . GERD (gastroesophageal reflux disease)   . Glaucoma 12-13  . Hoarseness   . Hoarseness of voice 05/11/2013  . Hyperglycemia 04/13/2013  . Hyperlipemia, mixed 06/06/2007   Qualifier: Diagnosis of  By: Wynona Luna She feels Lipitor caused increased low back pain and weakness    . Hyperlipidemia   . Hypertension   . Hypokalemia 02/05/2013  . IBS (irritable bowel syndrome) 02/13/2011  . Low back pain 03/03/2013  . Muscle cramp 05/22/2014  . Obesity   . Obesity, unspecified 05/11/2013  . OSA (obstructive sleep apnea)   . Pain in joint, shoulder region 06/27/2015  . Perimenopause  10/05/2012  . Raynaud disease 06/16/2013  . Thyroid disease    hypothyroidism    Past Surgical History:  Procedure Laterality Date  . ABDOMINAL HYSTERECTOMY    . CHOLECYSTECTOMY      Family History  Problem Relation Age of Onset  . Alcohol abuse Father   . Cancer Father        renal and colon  . Hyperlipidemia Father   . Hypertension Father   . Cancer Paternal Grandfather        colon  . Diabetes Unknown     Social History   Social History  . Marital status: Significant Other    Spouse name: N/A  . Number of children: N/A  . Years of education: N/A   Occupational History  . Not on file.   Social History Main Topics  . Smoking status: Former Smoker    Packs/day: 0.50    Years: 30.00    Types: Cigarettes    Quit date: 04/25/2003  . Smokeless tobacco: Never Used     Comment: smoked since age 7  . Alcohol use No  . Drug use: Unknown  . Sexual activity: Not on file   Other Topics Concern  . Not on file   Social History Narrative   Endo-- Dr Dwyane Dee   ENT--Dr shoemaker   GI--Dr [place]   Pulm--Dr Clance   Rheum--Dr Charlestine Night  Outpatient Medications Prior to Visit  Medication Sig Dispense Refill  . fenofibrate micronized (LOFIBRA) 134 MG capsule Take 1 capsule (134 mg total) by mouth daily before breakfast. 30 capsule 3  . furosemide (LASIX) 20 MG tablet Take 1-2 tablets by mouth daily as needed for fluid or edema 40 tablet 6  . metFORMIN (GLUCOPHAGE-XR) 500 MG 24 hr tablet TAKE 2 TABLETS BY MOUTH DAILY WITH BREAKFAST. 60 tablet PRN  . modafinil (PROVIGIL) 200 MG tablet Take 1.5 tabs daily 45 tablet 3  . Nebivolol HCl (BYSTOLIC) 20 MG TABS Take 1 tablet (20 mg total) by mouth daily. 90 tablet 2  . pregabalin (LYRICA) 25 MG capsule Take 1 capsule (25 mg total) by mouth every morning. 30 capsule 1  . spironolactone (ALDACTONE) 50 MG tablet Take 1 tablet (50 mg total) by mouth daily. 30 tablet 5  . SYNTHROID 100 MCG tablet TAKE 1 TABLET BY MOUTH DAILY  BEFORE BREAKFAST 30 tablet 5  . VENTOLIN HFA 108 (90 Base) MCG/ACT inhaler INHALE 2 PUFFS INTO THE LUNGS EVERY 4 HOURS AS NEEDED FOR WHEEZING OR SHORTNESS OF BREATH. 18 g 6  . Vitamin D, Ergocalciferol, (DRISDOL) 50000 units CAPS capsule TAKE 1 CAPSULE BY MOUTH EVERY 7 DAYS 12 capsule 1  . pregabalin (LYRICA) 75 MG capsule Take 1 capsule (75 mg total) by mouth daily. 30 capsule 1   Facility-Administered Medications Prior to Visit  Medication Dose Route Frequency Provider Last Rate Last Dose  . mupirocin ointment (BACTROBAN) 2 %   Nasal BID Mosie Lukes, MD        Allergies  Allergen Reactions  . Losartan Cough  . Aspirin Other (See Comments)    GI upset REACTION: Upset stomach  . Atorvastatin     myalgias  . Codeine   . Amoxicillin Rash  . Erythromycin Rash    Review of Systems  Constitutional: Positive for malaise/fatigue. Negative for fever.  HENT: Negative for congestion.   Eyes: Negative for blurred vision.  Respiratory: Negative for cough and shortness of breath.   Cardiovascular: Negative for chest pain, palpitations and leg swelling.  Gastrointestinal: Negative for vomiting.  Musculoskeletal: Negative for back pain.  Skin: Negative for rash.  Neurological: Negative for loss of consciousness and headaches.  Psychiatric/Behavioral: The patient is nervous/anxious.        Objective:    Physical Exam  Constitutional: She is oriented to person, place, and time. She appears well-developed and well-nourished. No distress.  HENT:  Head: Normocephalic and atraumatic.  Eyes: Conjunctivae are normal.  Neck: Normal range of motion. No thyromegaly present.  Cardiovascular: Normal rate and regular rhythm.   Pulmonary/Chest: Effort normal and breath sounds normal. She has no wheezes.  Abdominal: Soft. Bowel sounds are normal. There is no tenderness.  Musculoskeletal: Normal range of motion. She exhibits no edema or deformity.  Neurological: She is alert and oriented to  person, place, and time.  Skin: Skin is warm and dry. She is not diaphoretic.  Psychiatric: She has a normal mood and affect.    BP 122/72 (BP Location: Left Arm, Patient Position: Sitting, Cuff Size: Normal)   Pulse 66   Temp 98.2 F (36.8 C) (Oral)   Resp 18   Ht 5\' 5"  (1.651 m)   Wt 244 lb (110.7 kg)   SpO2 93%   BMI 40.60 kg/m  Wt Readings from Last 3 Encounters:  11/30/16 244 lb (110.7 kg)  11/13/16 245 lb 12.8 oz (111.5 kg)  08/23/16 249 lb 9.6 oz (  113.2 kg)   BP Readings from Last 3 Encounters:  11/30/16 122/72  11/13/16 136/90  08/23/16 120/80     Immunization History  Administered Date(s) Administered  . Influenza Whole 01/23/2008, 01/28/2009, 02/16/2010  . Influenza,inj,Quad PF,36+ Mos 01/06/2013, 01/10/2016  . Influenza-Unspecified 01/22/2014, 02/09/2015  . Pneumococcal Conjugate-13 05/22/2014  . Pneumococcal Polysaccharide-23 03/22/2015  . Td 12/18/2000  . Tdap 02/10/2014    Health Maintenance  Topic Date Due  . OPHTHALMOLOGY EXAM  02/04/2014  . FOOT EXAM  08/19/2016  . INFLUENZA VACCINE  11/22/2016  . HEMOGLOBIN A1C  04/12/2017  . MAMMOGRAM  06/27/2017  . URINE MICROALBUMIN  10/11/2017  . PAP SMEAR  06/29/2018  . PNEUMOCOCCAL POLYSACCHARIDE VACCINE (2) 03/21/2020  . COLONOSCOPY  04/05/2022  . TETANUS/TDAP  02/11/2024  . Hepatitis C Screening  Completed  . HIV Screening  Completed    Lab Results  Component Value Date   WBC 4.9 10/11/2016   HGB 13.3 10/11/2016   HCT 39.5 10/11/2016   PLT 359.0 10/11/2016   GLUCOSE 88 10/11/2016   CHOL 196 10/11/2016   TRIG 92.0 10/11/2016   HDL 45.70 10/11/2016   LDLDIRECT 103.0 07/11/2016   LDLCALC 132 (H) 10/11/2016   ALT 20 10/11/2016   AST 20 10/11/2016   NA 140 10/11/2016   K 3.7 10/11/2016   CL 106 10/11/2016   CREATININE 0.93 10/11/2016   BUN 12 10/11/2016   CO2 25 10/11/2016   TSH 1.30 10/11/2016   HGBA1C 6.3 10/11/2016   MICROALBUR 0.7 10/11/2016    Lab Results  Component Value Date    TSH 1.30 10/11/2016   Lab Results  Component Value Date   WBC 4.9 10/11/2016   HGB 13.3 10/11/2016   HCT 39.5 10/11/2016   MCV 88.9 10/11/2016   PLT 359.0 10/11/2016   Lab Results  Component Value Date   NA 140 10/11/2016   K 3.7 10/11/2016   CO2 25 10/11/2016   GLUCOSE 88 10/11/2016   BUN 12 10/11/2016   CREATININE 0.93 10/11/2016   BILITOT 0.3 10/11/2016   ALKPHOS 52 10/11/2016   AST 20 10/11/2016   ALT 20 10/11/2016   PROT 7.0 10/11/2016   ALBUMIN 4.4 10/11/2016   CALCIUM 10.0 10/11/2016   GFR 80.24 10/11/2016   Lab Results  Component Value Date   CHOL 196 10/11/2016   Lab Results  Component Value Date   HDL 45.70 10/11/2016   Lab Results  Component Value Date   LDLCALC 132 (H) 10/11/2016   Lab Results  Component Value Date   TRIG 92.0 10/11/2016   Lab Results  Component Value Date   CHOLHDL 4 10/11/2016   Lab Results  Component Value Date   HGBA1C 6.3 10/11/2016         Assessment & Plan:   Problem List Items Addressed This Visit    Hypothyroidism    On Levothyroxine, continue to monitor      Hyperlipemia, mixed    Encouraged heart healthy diet, increase exercise, avoid trans fats, consider a krill oil cap daily      Relevant Orders   Lipid panel   Essential hypertension    Well controlled, no changes to meds. Encouraged heart healthy diet such as the DASH diet and exercise as tolerated.       Relevant Orders   CBC   Comprehensive metabolic panel   TSH   Vitamin D deficiency    aking daily supplements      Relevant Orders   VITAMIN D  25 Hydroxy (Vit-D Deficiency, Fractures)   Diabetes mellitus type 2 in obese (HCC)    hgba1c acceptable, minimize simple carbs. Increase exercise as tolerated. Continue current meds      Relevant Orders   Hemoglobin A1c   Morbid obesity ([place])    Encouraged DASH diet, decrease po intake and increase exercise as tolerated. Needs 7-8 hours of sleep nightly. Avoid trans fats, eat small, frequent  meals every 4-5 hours with lean proteins, complex carbs and healthy fats. Minimize simple carbs, bariatric referral placed         I am having Ms. Lesmeister maintain her VENTOLIN HFA, Nebivolol HCl, metFORMIN, fenofibrate micronized, furosemide, pregabalin, SYNTHROID, Vitamin D (Ergocalciferol), spironolactone, and modafinil. We will continue to administer mupirocin ointment.  No orders of the defined types were placed in this encounter.   CMA served as Education administrator during this visit. History, Physical and Plan performed by medical provider. Documentation and orders reviewed and attested to.  Penni Homans, MD

## 2016-11-30 NOTE — Assessment & Plan Note (Signed)
hgba1c acceptable, minimize simple carbs. Increase exercise as tolerated. Continue current meds 

## 2016-12-12 ENCOUNTER — Encounter: Payer: Self-pay | Admitting: Family Medicine

## 2016-12-12 MED FILL — SYNTHROID 100 MCG TABLET: 100 | 90 days supply | Qty: 90 | Fill #0

## 2016-12-12 MED FILL — SPIRONOLACTONE 50 MG TABLET: 50 | 30 days supply | Qty: 30 | Fill #1

## 2016-12-13 DIAGNOSIS — R490 Dysphonia: Secondary | ICD-10-CM | POA: Diagnosis not present

## 2016-12-13 DIAGNOSIS — H6983 Other specified disorders of Eustachian tube, bilateral: Secondary | ICD-10-CM | POA: Diagnosis not present

## 2016-12-13 DIAGNOSIS — R070 Pain in throat: Secondary | ICD-10-CM | POA: Diagnosis not present

## 2016-12-13 MED ORDER — SYNTHROID 100 MCG PO TABS: 100.0000 ug | ORAL_TABLET | Freq: Every day | ORAL | 1 refills | Status: DC

## 2016-12-20 MED FILL — FUROSEMIDE 20 MG TABLET: 20 | 20 days supply | Qty: 40 | Fill #6

## 2016-12-21 MED FILL — FENOFIBRATE 134 MG CAPSULE: 134 | 30 days supply | Qty: 30 | Fill #3

## 2016-12-23 DIAGNOSIS — G4733 Obstructive sleep apnea (adult) (pediatric): Secondary | ICD-10-CM | POA: Diagnosis not present

## 2016-12-23 DIAGNOSIS — I10 Essential (primary) hypertension: Secondary | ICD-10-CM | POA: Diagnosis not present

## 2016-12-26 DIAGNOSIS — M9902 Segmental and somatic dysfunction of thoracic region: Secondary | ICD-10-CM | POA: Diagnosis not present

## 2016-12-26 DIAGNOSIS — M5136 Other intervertebral disc degeneration, lumbar region: Secondary | ICD-10-CM | POA: Diagnosis not present

## 2016-12-26 DIAGNOSIS — M9903 Segmental and somatic dysfunction of lumbar region: Secondary | ICD-10-CM | POA: Diagnosis not present

## 2016-12-26 DIAGNOSIS — M9905 Segmental and somatic dysfunction of pelvic region: Secondary | ICD-10-CM | POA: Diagnosis not present

## 2017-01-03 MED FILL — MODAFINIL 200 MG TAB: 200 | 30 days supply | Qty: 45 | Fill #0

## 2017-01-09 ENCOUNTER — Other Ambulatory Visit: Payer: Self-pay | Admitting: Family Medicine

## 2017-01-09 MED FILL — SPIRONOLACTONE 50 MG TAB: 50 | 30 days supply | Qty: 30 | Fill #2

## 2017-01-09 MED FILL — FUROSEMIDE 20 MG TABLET: 20 | 20 days supply | Qty: 40 | Fill #0

## 2017-01-22 DIAGNOSIS — G4733 Obstructive sleep apnea (adult) (pediatric): Secondary | ICD-10-CM | POA: Diagnosis not present

## 2017-01-22 DIAGNOSIS — I10 Essential (primary) hypertension: Secondary | ICD-10-CM | POA: Diagnosis not present

## 2017-01-23 ENCOUNTER — Other Ambulatory Visit: Payer: Self-pay | Admitting: Family Medicine

## 2017-01-23 DIAGNOSIS — M9903 Segmental and somatic dysfunction of lumbar region: Secondary | ICD-10-CM | POA: Diagnosis not present

## 2017-01-23 DIAGNOSIS — M5136 Other intervertebral disc degeneration, lumbar region: Secondary | ICD-10-CM | POA: Diagnosis not present

## 2017-01-23 DIAGNOSIS — M9905 Segmental and somatic dysfunction of pelvic region: Secondary | ICD-10-CM | POA: Diagnosis not present

## 2017-01-23 DIAGNOSIS — M9902 Segmental and somatic dysfunction of thoracic region: Secondary | ICD-10-CM | POA: Diagnosis not present

## 2017-01-23 MED FILL — VIT D2 1.25 MG (50,000 UNIT: 1.25 MG | 84 days supply | Qty: 12 | Fill #1

## 2017-01-23 MED FILL — FENOFIBRATE 134 MG CAPSULE: 134 | 30 days supply | Qty: 30 | Fill #0

## 2017-02-02 MED FILL — MODAFINIL 200 MG TAB: 200 | 30 days supply | Qty: 45 | Fill #1

## 2017-02-05 ENCOUNTER — Encounter: Payer: Self-pay | Admitting: Acute Care

## 2017-02-05 ENCOUNTER — Ambulatory Visit (INDEPENDENT_AMBULATORY_CARE_PROVIDER_SITE_OTHER): Payer: 59 | Admitting: Acute Care

## 2017-02-05 DIAGNOSIS — G4733 Obstructive sleep apnea (adult) (pediatric): Secondary | ICD-10-CM

## 2017-02-05 MED FILL — FUROSEMIDE 20 MG TABLET: 20 | 20 days supply | Qty: 40 | Fill #1

## 2017-02-05 NOTE — Patient Instructions (Signed)
It is good to see you again today. Continue on CPAP at bedtime. You appear to be benefiting from the treatment Goal is to wear for at least 6 hours each night for maximal clinical benefit. Continue to work on weight loss, as the link between excess weight  and sleep apnea is well established.  Do not drive if sleepy. Remember to clean mask, tubing, filter, and reservoir once weekly with soapy water.  Follow up with Dr. Chaya Jan NP in 6 months  or before as needed. Please contact office for sooner follow up if symptoms do not improve or worsen or seek emergency care

## 2017-02-05 NOTE — Progress Notes (Signed)
I have reviewed and agree with assessment/plan.  Chesley Mires, MD [facility] Pulmonary/Critical Care 02/05/2017, 4:05 PM Pager:  [phone]

## 2017-02-05 NOTE — Progress Notes (Signed)
History of Present Illness [name] is a 56 y.o. female never smoker, quit 2005,  with OSA with CPAP use. She is followed by Dr. Halford Chessman.   02/05/2017 Follow Up CPAP: Pt. States she got her new machine in August and she is doing well. She has no daytime sleepiness, and she feels more rested and focused throughout the day. She recently had rotator cuff surgery, which she is recovering from well. She states she is compliant with her Provigil 200 mg twice daily.She denies fever, chest pain, orthopnea or hemoptysis.She has had her flu shot..   Test Results: Down Load: Auto Set 5-20 cm H2O 01/03/2017-02/01/2017 Usage 30/30 days ( 100%) > 4 hours 30 days Average sleep 8 hours 18 minutes Pressure max: 12.8 cm H2O Pressure median 8.5 cm H2O AHI= 0.5  CBC Latest Ref Rng & Units 10/11/2016 07/11/2016 05/25/2016  WBC 4.0 - 10.5 K/uL 4.9 4.6 6.5  Hemoglobin 12.0 - 15.0 g/dL 13.3 13.5 12.3  Hematocrit 36.0 - 46.0 % 39.5 40.2 37.1  Platelets 150.0 - 400.0 K/uL 359.0 333.0 367.0    BMP Latest Ref Rng & Units 10/11/2016 07/11/2016 04/04/2016  Glucose 70 - 99 mg/dL 88 88 96  BUN 6 - 23 mg/dL 12 12 11   Creatinine 0.40 - 1.20 mg/dL 0.93 0.79 1.02  Sodium 135 - 145 mEq/L 140 140 142  Potassium 3.5 - 5.1 mEq/L 3.7 3.8 4.0  Chloride 96 - 112 mEq/L 106 105 107  CO2 19 - 32 mEq/L 25 27 24   Calcium 8.4 - 10.5 mg/dL 10.0 9.7 9.7     PFT    Component Value Date/Time   FEV1PRE 2.24 02/25/2016 1100   FEV1POST 2.36 02/25/2016 1100   FVCPRE 2.63 02/25/2016 1100   FVCPOST 2.67 02/25/2016 1100   TLC 4.02 02/25/2016 1100   DLCOUNC 18.63 02/25/2016 1100   PREFEV1FVCRT 85 02/25/2016 1100   PSTFEV1FVCRT 88 02/25/2016 1100    No results found.   Past medical hx Past Medical History:  Diagnosis Date  . Acute bronchitis 05/25/2016  . Anxiety   . Chronic pain syndrome 01/06/2013  . Chronic rhinosinusitis   . Colon polyps   . Cough 01/06/2013  . Depression   . Diabetes ([place]) 01/06/2013  .  Diabetes mellitus type 2 in obese [facility]) 01/06/2013   Sees Dr Calvert Cantor for eye exam Does not see podiatry, foot exam today unremarkable except for thick cracking skin on heals   . Dysuria 05/25/2016  . Edema 01/06/2013  . Epistaxis 05/25/2016  . Fibroid, uterine   . GERD (gastroesophageal reflux disease)   . Glaucoma 12-13  . Hoarseness   . Hoarseness of voice 05/11/2013  . Hyperglycemia 04/13/2013  . Hyperlipemia, mixed 06/06/2007   Qualifier: Diagnosis of  By: Wynona Luna She feels Lipitor caused increased low back pain and weakness    . Hyperlipidemia   . Hypertension   . Hypokalemia 02/05/2013  . IBS (irritable bowel syndrome) 02/13/2011  . Low back pain 03/03/2013  . Muscle cramp 05/22/2014  . Obesity   . Obesity, unspecified 05/11/2013  . OSA (obstructive sleep apnea)   . Pain in joint, shoulder region 06/27/2015  . Perimenopause 10/05/2012  . Raynaud disease 06/16/2013  . Thyroid disease    hypothyroidism     Social History  Substance Use Topics  . Smoking status: Former Smoker    Packs/day: 0.50    Years: 30.00    Types: Cigarettes    Quit date: 04/25/2003  .  Smokeless tobacco: Never Used     Comment: smoked since age 56  . Alcohol use No    Ms.Skalicky reports that she quit smoking about 13 years ago. Her smoking use included Cigarettes. She has a 15.00 pack-year smoking history. She has never used smokeless tobacco. She reports that she does not drink alcohol.  Tobacco Cessation: Former smoker Quit 2005  Past surgical hx, Family hx, Social hx all reviewed.  Current Outpatient Prescriptions on File Prior to Visit  Medication Sig  . fenofibrate micronized (LOFIBRA) 134 MG capsule TAKE 1 CAPSULE BY MOUTH DAILY BEFORE BREAKFAST.  . furosemide (LASIX) 20 MG tablet TAKE 1-2 TABLETS BY MOUTH DAILY AS NEEDED FOR FLUID OR EDEMA  . metFORMIN (GLUCOPHAGE-XR) 500 MG 24 hr tablet TAKE 2 TABLETS BY MOUTH DAILY WITH BREAKFAST.  . modafinil (PROVIGIL) 200 MG tablet Take 1.5 tabs  daily  . Nebivolol HCl (BYSTOLIC) 20 MG TABS Take 1 tablet (20 mg total) by mouth daily.  Marland Kitchen spironolactone (ALDACTONE) 50 MG tablet Take 1 tablet (50 mg total) by mouth daily.  Marland Kitchen SYNTHROID 100 MCG tablet Take 1 tablet (100 mcg total) by mouth daily before breakfast.  . VENTOLIN HFA 108 (90 Base) MCG/ACT inhaler INHALE 2 PUFFS INTO THE LUNGS EVERY 4 HOURS AS NEEDED FOR WHEEZING OR SHORTNESS OF BREATH.  Marland Kitchen Vitamin D, Ergocalciferol, (DRISDOL) 50000 units CAPS capsule TAKE 1 CAPSULE BY MOUTH EVERY 7 DAYS   Current Facility-Administered Medications on File Prior to Visit  Medication  . mupirocin ointment (BACTROBAN) 2 %     Allergies  Allergen Reactions  . Losartan Cough  . Aspirin Other (See Comments)    GI upset REACTION: Upset stomach  . Atorvastatin     myalgias  . Codeine   . Amoxicillin Rash  . Erythromycin Rash    Review Of Systems:  Constitutional:   No  weight loss, night sweats,  Fevers, chills, fatigue, or  lassitude.  HEENT:   No headaches,  Difficulty swallowing,  Tooth/dental problems, or  Sore throat,                No sneezing, itching, ear ache, nasal congestion, post nasal drip,   CV:  No chest pain,  Orthopnea, PND, swelling in lower extremities, anasarca, dizziness, palpitations, syncope.   GI  No heartburn, indigestion, abdominal pain, nausea, vomiting, diarrhea, change in bowel habits, loss of appetite, bloody stools.   Resp: No shortness of breath with exertion or at rest.  No excess mucus, no productive cough,  No non-productive cough,  No coughing up of blood.  No change in color of mucus.  No wheezing.  No chest wall deformity  Skin: no rash or lesions.  GU: no dysuria, change in color of urine, no urgency or frequency.  No flank pain, no hematuria   MS:  No joint pain or swelling.  No decreased range of motion.  No back pain.  Psych:  No change in mood or affect. No depression or anxiety.  No memory loss.   Vital Signs BP 140/88 (BP Location: Left  Arm, Cuff Size: Normal)   Pulse 71   Ht 5' 4.5" (1.638 m)   Wt 248 lb 3.2 oz (112.6 kg)   SpO2 100%   BMI 41.95 kg/m    Physical Exam:  General- No distress,  A&Ox3, pleasant ENT: No sinus tenderness, TM clear, pale nasal mucosa, no oral exudate,no post nasal drip, no LAN Cardiac: S1, S2, regular rate and rhythm, no murmur Chest: No  wheeze/ rales/ dullness; no accessory muscle use, no nasal flaring, no sternal retractions Abd.: Soft Non-tender Ext: No clubbing cyanosis, edema Neuro:  normal strength Skin: No rashes, warm and dry Psych: normal mood and behavior   Assessment/Plan  OSA (obstructive sleep apnea) Compliant with CPAP Benefitting from treatment Plan Continue on CPAP at bedtime. You appear to be benefiting from the treatment Goal is to wear for at least 6 hours each night for maximal clinical benefit. Continue to work on weight loss, as the link between excess weight  and sleep apnea is well established.  Do not drive if sleepy. Remember to clean mask, tubing, filter, and reservoir once weekly with soapy water.  Follow up with Dr. Chaya Jan NP in 6 months  or before as needed. Please contact office for sooner follow up if symptoms do not improve or worsen or seek emergency care      Magdalen Spatz, NP 02/05/2017  10:31 AM

## 2017-02-05 NOTE — Assessment & Plan Note (Signed)
Compliant with CPAP Benefitting from treatment Plan Continue on CPAP at bedtime. You appear to be benefiting from the treatment Goal is to wear for at least 6 hours each night for maximal clinical benefit. Continue to work on weight loss, as the link between excess weight  and sleep apnea is well established.  Do not drive if sleepy. Remember to clean mask, tubing, filter, and reservoir once weekly with soapy water.  Follow up with Dr. Chaya Jan NP in 6 months  or before as needed. Please contact office for sooner follow up if symptoms do not improve or worsen or seek emergency care

## 2017-02-19 MED FILL — SPIRONOLACTONE 50 MG TAB: 50 | 30 days supply | Qty: 30 | Fill #3

## 2017-02-22 ENCOUNTER — Other Ambulatory Visit (INDEPENDENT_AMBULATORY_CARE_PROVIDER_SITE_OTHER): Payer: 59

## 2017-02-22 DIAGNOSIS — I10 Essential (primary) hypertension: Secondary | ICD-10-CM

## 2017-02-22 DIAGNOSIS — E782 Mixed hyperlipidemia: Secondary | ICD-10-CM

## 2017-02-22 DIAGNOSIS — G4733 Obstructive sleep apnea (adult) (pediatric): Secondary | ICD-10-CM | POA: Diagnosis not present

## 2017-02-22 DIAGNOSIS — E1169 Type 2 diabetes mellitus with other specified complication: Secondary | ICD-10-CM | POA: Diagnosis not present

## 2017-02-22 DIAGNOSIS — E669 Obesity, unspecified: Secondary | ICD-10-CM | POA: Diagnosis not present

## 2017-02-22 DIAGNOSIS — E559 Vitamin D deficiency, unspecified: Secondary | ICD-10-CM

## 2017-02-22 LAB — COMPREHENSIVE METABOLIC PANEL
ALT: 18 U/L (ref 0–35)
AST: 15 U/L (ref 0–37)
Albumin: 4.3 g/dL (ref 3.5–5.2)
Alkaline Phosphatase: 56 U/L (ref 39–117)
BUN: 11 mg/dL (ref 6–23)
CO2: 27 mEq/L (ref 19–32)
Calcium: 10.2 mg/dL (ref 8.4–10.5)
Chloride: 105 mEq/L (ref 96–112)
Creatinine, Ser: 0.93 mg/dL (ref 0.40–1.20)
GFR: 80.13 mL/min (ref >60.00)
Glucose, Bld: 89 mg/dL (ref 70–99)
Potassium: 3.8 mEq/L (ref 3.5–5.1)
Sodium: 139 mEq/L (ref 135–145)
Total Bilirubin: 0.2 mg/dL (ref 0.2–1.2)
Total Protein: 7.2 g/dL (ref 6.0–8.3)

## 2017-02-22 LAB — CBC
HCT: 40.1 % (ref 36.0–46.0)
Hemoglobin: 13.2 g/dL (ref 12.0–15.0)
MCHC: 33 g/dL (ref 30.0–36.0)
MCV: 90.8 fl (ref 78.0–100.0)
Platelets: 355 10*3/uL (ref 150.0–400.0)
RBC: 4.42 Mil/uL (ref 3.87–5.11)
RDW: 13.6 % (ref 11.5–15.5)
WBC: 5.5 10*3/uL (ref 4.0–10.5)

## 2017-02-22 LAB — LIPID PANEL
Cholesterol: 203 mg/dL — ABNORMAL HIGH (ref 0–200)
HDL: 45.8 mg/dL (ref >39.00)
LDL Cholesterol: 134 mg/dL — ABNORMAL HIGH (ref 0–99)
NonHDL: 156.79
Total CHOL/HDL Ratio: 4
Triglycerides: 113 mg/dL (ref 0.0–149.0)
VLDL: 22.6 mg/dL (ref 0.0–40.0)

## 2017-02-22 LAB — TSH: TSH: 0.47 u[IU]/mL (ref 0.35–4.50)

## 2017-02-22 LAB — VITAMIN D 25 HYDROXY (VIT D DEFICIENCY, FRACTURES): VITD: 41.39 ng/mL (ref 30.00–100.00)

## 2017-02-22 LAB — HEMOGLOBIN A1C: Hgb A1c MFr Bld: 6 % (ref 4.6–6.5)

## 2017-02-23 DIAGNOSIS — R490 Dysphonia: Secondary | ICD-10-CM | POA: Diagnosis not present

## 2017-02-23 DIAGNOSIS — J310 Chronic rhinitis: Secondary | ICD-10-CM | POA: Diagnosis not present

## 2017-02-23 DIAGNOSIS — J384 Edema of larynx: Secondary | ICD-10-CM | POA: Diagnosis not present

## 2017-02-23 DIAGNOSIS — R05 Cough: Secondary | ICD-10-CM | POA: Diagnosis not present

## 2017-02-23 MED FILL — IPRATROPIUM 0.06% SPRAY: 0.06 | 14 days supply | Qty: 15 | Fill #0

## 2017-02-26 ENCOUNTER — Other Ambulatory Visit: Payer: Self-pay | Admitting: Family Medicine

## 2017-02-26 MED FILL — BYSTOLIC 20 MG TABLET: 20 | 90 days supply | Qty: 90 | Fill #0

## 2017-02-26 MED FILL — FUROSEMIDE 20 MG TABLET: 20 | 20 days supply | Qty: 40 | Fill #0

## 2017-02-28 DIAGNOSIS — M5136 Other intervertebral disc degeneration, lumbar region: Secondary | ICD-10-CM | POA: Diagnosis not present

## 2017-02-28 DIAGNOSIS — M9902 Segmental and somatic dysfunction of thoracic region: Secondary | ICD-10-CM | POA: Diagnosis not present

## 2017-02-28 DIAGNOSIS — M9905 Segmental and somatic dysfunction of pelvic region: Secondary | ICD-10-CM | POA: Diagnosis not present

## 2017-02-28 DIAGNOSIS — M9903 Segmental and somatic dysfunction of lumbar region: Secondary | ICD-10-CM | POA: Diagnosis not present

## 2017-02-28 MED FILL — FENOFIBRATE 134 MG CAPSULE: 134 | 30 days supply | Qty: 30 | Fill #1

## 2017-03-01 ENCOUNTER — Encounter: Payer: Self-pay | Admitting: Family Medicine

## 2017-03-01 ENCOUNTER — Ambulatory Visit (INDEPENDENT_AMBULATORY_CARE_PROVIDER_SITE_OTHER): Payer: 59 | Admitting: Family Medicine

## 2017-03-01 VITALS — BP 122/78 | HR 78 | Temp 98.6°F | Resp 18 | Wt 244.6 lb

## 2017-03-01 DIAGNOSIS — R490 Dysphonia: Secondary | ICD-10-CM | POA: Diagnosis not present

## 2017-03-01 DIAGNOSIS — I10 Essential (primary) hypertension: Secondary | ICD-10-CM | POA: Diagnosis not present

## 2017-03-01 DIAGNOSIS — M109 Gout, unspecified: Secondary | ICD-10-CM

## 2017-03-01 DIAGNOSIS — E782 Mixed hyperlipidemia: Secondary | ICD-10-CM

## 2017-03-01 DIAGNOSIS — E669 Obesity, unspecified: Secondary | ICD-10-CM

## 2017-03-01 DIAGNOSIS — E1169 Type 2 diabetes mellitus with other specified complication: Secondary | ICD-10-CM

## 2017-03-01 DIAGNOSIS — E559 Vitamin D deficiency, unspecified: Secondary | ICD-10-CM | POA: Diagnosis not present

## 2017-03-01 DIAGNOSIS — M791 Myalgia, unspecified site: Secondary | ICD-10-CM

## 2017-03-01 DIAGNOSIS — E038 Other specified hypothyroidism: Secondary | ICD-10-CM

## 2017-03-01 DIAGNOSIS — R739 Hyperglycemia, unspecified: Secondary | ICD-10-CM

## 2017-03-01 DIAGNOSIS — G4733 Obstructive sleep apnea (adult) (pediatric): Secondary | ICD-10-CM | POA: Diagnosis not present

## 2017-03-01 DIAGNOSIS — E039 Hypothyroidism, unspecified: Secondary | ICD-10-CM | POA: Diagnosis not present

## 2017-03-01 MED ORDER — RANITIDINE HCL 300 MG PO TABS: 300.0000 mg | ORAL_TABLET | Freq: Every day | ORAL | 3 refills | Status: DC

## 2017-03-01 NOTE — Assessment & Plan Note (Signed)
Check level 

## 2017-03-01 NOTE — Progress Notes (Signed)
Subjective:  I acted as a Education administrator for Dr. Charlett Blake. [place], [place]  Patient ID: [name], female    DOB: 02-06-1961, 56 y.o.   MRN: [id number]  No chief complaint on file.   HPI  Patient is in today for a 3 month follow up and denies any recent febrile illness or hospitalizations. She is noting a worsening of her hoarseness in her throat lately and wonders if it because she stopped th Gabapentin. Still has a dry cough at times. Is doing better with fatigue on Provigil. Is using CPAP routinely. Denies CP/palp/SOB/HA/congestion/fevers/GI or GU c/o. Taking meds as prescribed  Patient Care Team: Mosie Lukes, MD as PCP - General (Family Medicine) Molli Posey, MD as Consulting Physician (Obstetrics and Gynecology) Virgina Evener, OD as Referring Physician (Optometry)   Past Medical History:  Diagnosis Date  . Acute bronchitis 05/25/2016  . Anxiety   . Chronic pain syndrome 01/06/2013  . Chronic rhinosinusitis   . Colon polyps   . Cough 01/06/2013  . Depression   . Diabetes ([place]) 01/06/2013  . Diabetes mellitus type 2 in obese [facility]) 01/06/2013   Sees Dr Calvert Cantor for eye exam Does not see podiatry, foot exam today unremarkable except for thick cracking skin on heals   . Dysuria 05/25/2016  . Edema 01/06/2013  . Epistaxis 05/25/2016  . Fibroid, uterine   . GERD (gastroesophageal reflux disease)   . Glaucoma 12-13  . Hoarseness   . Hoarseness of voice 05/11/2013  . Hyperglycemia 04/13/2013  . Hyperlipemia, mixed 06/06/2007   Qualifier: Diagnosis of  By: Wynona Luna She feels Lipitor caused increased low back pain and weakness    . Hyperlipidemia   . Hypertension   . Hypokalemia 02/05/2013  . IBS (irritable bowel syndrome) 02/13/2011  . Low back pain 03/03/2013  . Muscle cramp 05/22/2014  . Obesity   . Obesity, unspecified 05/11/2013  . OSA (obstructive sleep apnea)   . Pain in joint, shoulder region 06/27/2015  . Perimenopause 10/05/2012  . Raynaud disease 06/16/2013  .  Thyroid disease    hypothyroidism    Past Surgical History:  Procedure Laterality Date  . ABDOMINAL HYSTERECTOMY    . CHOLECYSTECTOMY      Family History  Problem Relation Age of Onset  . Alcohol abuse Father   . Cancer Father        renal and colon  . Hyperlipidemia Father   . Hypertension Father   . Cancer Paternal Grandfather        colon  . Diabetes Unknown     Social History   Socioeconomic History  . Marital status: Significant Other    Spouse name: Not on file  . Number of children: Not on file  . Years of education: Not on file  . Highest education level: Not on file  Social Needs  . Financial resource strain: Not on file  . Food insecurity - worry: Not on file  . Food insecurity - inability: Not on file  . Transportation needs - medical: Not on file  . Transportation needs - non-medical: Not on file  Occupational History  . Not on file  Tobacco Use  . Smoking status: Former Smoker    Packs/day: 0.50    Years: 30.00    Pack years: 15.00    Types: Cigarettes    Last attempt to quit: 04/25/2003    Years since quitting: 13.8  . Smokeless tobacco: Never Used  . Tobacco comment: smoked since age  16  Substance and Sexual Activity  . Alcohol use: No  . Drug use: Not on file  . Sexual activity: Not on file  Other Topics Concern  . Not on file  Social History Narrative   Endo-- Dr Dwyane Dee   ENT--Dr shoemaker   GI--Dr Buccini   Pulm--Dr Clance   Rheum--Dr Charlestine Night          Outpatient Medications Prior to Visit  Medication Sig Dispense Refill  . BYSTOLIC 20 MG TABS TAKE 1 TABLET BY MOUTH ONCE DAILY 90 tablet 2  . fenofibrate micronized (LOFIBRA) 134 MG capsule TAKE 1 CAPSULE BY MOUTH DAILY BEFORE BREAKFAST. 30 capsule 1  . furosemide (LASIX) 20 MG tablet TAKE 1-2 TABLETS BY MOUTH DAILY AS NEEDED FOR FLUID OR EDEMA 40 tablet 1  . metFORMIN (GLUCOPHAGE-XR) 500 MG 24 hr tablet TAKE 2 TABLETS BY MOUTH DAILY WITH BREAKFAST. 60 tablet PRN  . modafinil  (PROVIGIL) 200 MG tablet Take 1.5 tabs daily 45 tablet 3  . spironolactone (ALDACTONE) 50 MG tablet Take 1 tablet (50 mg total) by mouth daily. 30 tablet 5  . SYNTHROID 100 MCG tablet Take 1 tablet (100 mcg total) by mouth daily before breakfast. 90 tablet 1  . VENTOLIN HFA 108 (90 Base) MCG/ACT inhaler INHALE 2 PUFFS INTO THE LUNGS EVERY 4 HOURS AS NEEDED FOR WHEEZING OR SHORTNESS OF BREATH. 18 g 6  . Vitamin D, Ergocalciferol, (DRISDOL) 50000 units CAPS capsule TAKE 1 CAPSULE BY MOUTH EVERY 7 DAYS 12 capsule 1  . mupirocin ointment (BACTROBAN) 2 %      No facility-administered medications prior to visit.     Allergies  Allergen Reactions  . Losartan Cough  . Aspirin Other (See Comments)    GI upset REACTION: Upset stomach  . Atorvastatin     myalgias  . Codeine   . Amoxicillin Rash  . Erythromycin Rash    Review of Systems  Constitutional: Positive for malaise/fatigue. Negative for fever.  HENT: Negative for congestion.   Eyes: Negative for blurred vision.  Respiratory: Positive for cough. Negative for shortness of breath.   Cardiovascular: Negative for chest pain, palpitations and leg swelling.  Gastrointestinal: Positive for heartburn. Negative for abdominal pain, blood in stool and nausea.  Genitourinary: Negative for dysuria and frequency.  Musculoskeletal: Negative for falls.  Skin: Negative for rash.  Neurological: Negative for dizziness, loss of consciousness and headaches.  Endo/Heme/Allergies: Negative for environmental allergies.  Psychiatric/Behavioral: Negative for depression. The patient is not nervous/anxious.        Objective:    Physical Exam  Constitutional: She is oriented to person, place, and time. She appears well-developed and well-nourished. No distress.  HENT:  Head: Normocephalic and atraumatic.  Nose: Nose normal.  Eyes: Right eye exhibits no discharge. Left eye exhibits no discharge.  Neck: Normal range of motion. Neck supple.    Cardiovascular: Normal rate and regular rhythm.  No murmur heard. Pulmonary/Chest: Effort normal and breath sounds normal.  Abdominal: Soft. Bowel sounds are normal. There is no tenderness.  Musculoskeletal: She exhibits no edema.  Neurological: She is alert and oriented to person, place, and time.  Skin: Skin is warm and dry.  Psychiatric: She has a normal mood and affect.  Nursing note and vitals reviewed.   There were no vitals taken for this visit. Wt Readings from Last 3 Encounters:  02/05/17 248 lb 3.2 oz (112.6 kg)  11/30/16 244 lb (110.7 kg)  11/13/16 245 lb 12.8 oz (111.5 kg)   BP  Readings from Last 3 Encounters:  02/05/17 140/88  11/30/16 122/72  11/13/16 136/90     Immunization History  Administered Date(s) Administered  . Influenza Whole 01/23/2008, 01/28/2009, 02/16/2010  . Influenza,inj,Quad PF,6+ Mos 01/06/2013, 01/10/2016  . Influenza-Unspecified 01/22/2014, 02/09/2015, 01/17/2017  . Pneumococcal Conjugate-13 05/22/2014  . Pneumococcal Polysaccharide-23 03/22/2015  . Td 12/18/2000  . Tdap 02/10/2014    Health Maintenance  Topic Date Due  . OPHTHALMOLOGY EXAM  02/04/2014  . FOOT EXAM  08/19/2016  . MAMMOGRAM  06/27/2017  . HEMOGLOBIN A1C  08/22/2017  . URINE MICROALBUMIN  10/11/2017  . PAP SMEAR  06/29/2018  . PNEUMOCOCCAL POLYSACCHARIDE VACCINE (2) 03/21/2020  . COLONOSCOPY  04/05/2022  . TETANUS/TDAP  02/11/2024  . INFLUENZA VACCINE  Completed  . Hepatitis C Screening  Completed  . HIV Screening  Completed    Lab Results  Component Value Date   WBC 5.5 02/22/2017   HGB 13.2 02/22/2017   HCT 40.1 02/22/2017   PLT 355.0 02/22/2017   GLUCOSE 89 02/22/2017   CHOL 203 (H) 02/22/2017   TRIG 113.0 02/22/2017   HDL 45.80 02/22/2017   LDLDIRECT 103.0 07/11/2016   LDLCALC 134 (H) 02/22/2017   ALT 18 02/22/2017   AST 15 02/22/2017   NA 139 02/22/2017   K 3.8 02/22/2017   CL 105 02/22/2017   CREATININE 0.93 02/22/2017   BUN 11 02/22/2017    CO2 27 02/22/2017   TSH 0.47 02/22/2017   HGBA1C 6.0 02/22/2017   MICROALBUR 0.7 10/11/2016    Lab Results  Component Value Date   TSH 0.47 02/22/2017   Lab Results  Component Value Date   WBC 5.5 02/22/2017   HGB 13.2 02/22/2017   HCT 40.1 02/22/2017   MCV 90.8 02/22/2017   PLT 355.0 02/22/2017   Lab Results  Component Value Date   NA 139 02/22/2017   K 3.8 02/22/2017   CO2 27 02/22/2017   GLUCOSE 89 02/22/2017   BUN 11 02/22/2017   CREATININE 0.93 02/22/2017   BILITOT 0.2 02/22/2017   ALKPHOS 56 02/22/2017   AST 15 02/22/2017   ALT 18 02/22/2017   PROT 7.2 02/22/2017   ALBUMIN 4.3 02/22/2017   CALCIUM 10.2 02/22/2017   GFR 80.13 02/22/2017   Lab Results  Component Value Date   CHOL 203 (H) 02/22/2017   Lab Results  Component Value Date   HDL 45.80 02/22/2017   Lab Results  Component Value Date   LDLCALC 134 (H) 02/22/2017   Lab Results  Component Value Date   TRIG 113.0 02/22/2017   Lab Results  Component Value Date   CHOLHDL 4 02/22/2017   Lab Results  Component Value Date   HGBA1C 6.0 02/22/2017         Assessment & Plan:   Problem List Items Addressed This Visit    None      I am having Chiamaka S. Leppla maintain her VENTOLIN HFA, metFORMIN, Vitamin D (Ergocalciferol), spironolactone, modafinil, SYNTHROID, fenofibrate micronized, BYSTOLIC, and furosemide. We will stop administering mupirocin ointment.  No orders of the defined types were placed in this encounter.   CMA served as Education administrator during this visit. History, Physical and Plan performed by medical provider. Documentation and orders reviewed and attested to.  Magdalene Molly, [place]

## 2017-03-01 NOTE — Patient Instructions (Signed)
Food Choices for Gastroesophageal Reflux Disease, Adult When you have gastroesophageal reflux disease (GERD), the foods you eat and your eating habits are very important. Choosing the right foods can help ease your discomfort. What guidelines do I need to follow?  Choose fruits, vegetables, whole grains, and low-fat dairy products.  Choose low-fat meat, fish, and poultry.  Limit fats such as oils, salad dressings, butter, nuts, and avocado.  Keep a food diary. This helps you identify foods that cause symptoms.  Avoid foods that cause symptoms. These may be different for everyone.  Eat small meals often instead of 3 large meals a day.  Eat your meals slowly, in a place where you are relaxed.  Limit fried foods.  Cook foods using methods other than frying.  Avoid drinking alcohol.  Avoid drinking large amounts of liquids with your meals.  Avoid bending over or lying down until 2-3 hours after eating. What foods are not recommended? These are some foods and drinks that may make your symptoms worse: Vegetables  Tomatoes. Tomato juice. Tomato and spaghetti sauce. Chili peppers. Onion and garlic. Horseradish. Fruits  Oranges, grapefruit, and lemon (fruit and juice). Meats  High-fat meats, fish, and poultry. This includes hot dogs, ribs, ham, sausage, salami, and bacon. Dairy  Whole milk and chocolate milk. Sour cream. Cream. Butter. Ice cream. Cream cheese. Drinks  Coffee and tea. Bubbly (carbonated) drinks or energy drinks. Condiments  Hot sauce. Barbecue sauce. Sweets/Desserts  Chocolate and cocoa. Donuts. Peppermint and spearmint. Fats and Oils  High-fat foods. This includes French fries and potato chips. Other  Vinegar. Strong spices. This includes black pepper, white pepper, red pepper, cayenne, curry powder, cloves, ginger, and chili powder. The items listed above may not be a complete list of foods and drinks to avoid. Contact your dietitian for more information.    This information is not intended to replace advice given to you by your health care provider. Make sure you discuss any questions you have with your health care provider. Document Released: 10/10/2011 Document Revised: 09/16/2015 Document Reviewed: 02/12/2013 Elsevier Interactive Patient Education  2017 Elsevier Inc.  

## 2017-03-01 NOTE — Assessment & Plan Note (Signed)
Stay well hydrated, take Calcium and magnesiu

## 2017-03-01 NOTE — Assessment & Plan Note (Signed)
Check tsh level today

## 2017-03-05 MED ORDER — RANITIDINE HCL 300 MG PO TABS: 300.0000 mg | ORAL_TABLET | Freq: Every day | ORAL | 3 refills | Status: DC

## 2017-03-05 NOTE — Assessment & Plan Note (Signed)
Follows with pulmonology and using CPAP

## 2017-03-05 NOTE — Assessment & Plan Note (Signed)
Continues to follow at [facility] and is noting it has worsened some since she stopped gabapentin.

## 2017-03-05 NOTE — Assessment & Plan Note (Signed)
Well controlled, no changes to meds. Encouraged heart healthy diet such as the DASH diet and exercise as tolerated.  °

## 2017-03-05 NOTE — Assessment & Plan Note (Signed)
hgba1c acceptable, minimize simple carbs. Increase exercise as tolerated. Continue current meds 

## 2017-03-05 NOTE — Assessment & Plan Note (Signed)
On Levothyroxine, continue to monitor 

## 2017-03-12 MED FILL — SYNTHROID 100 MCG TABLET: 100 | 90 days supply | Qty: 90 | Fill #1

## 2017-03-12 MED FILL — MODAFINIL 200 MG TAB: 200 | 30 days supply | Qty: 45 | Fill #2

## 2017-03-20 DIAGNOSIS — R490 Dysphonia: Secondary | ICD-10-CM | POA: Diagnosis not present

## 2017-03-20 DIAGNOSIS — R12 Heartburn: Secondary | ICD-10-CM | POA: Diagnosis not present

## 2017-03-20 DIAGNOSIS — L290 Pruritus ani: Secondary | ICD-10-CM | POA: Diagnosis not present

## 2017-03-20 DIAGNOSIS — Z800 Family history of malignant neoplasm of digestive organs: Secondary | ICD-10-CM | POA: Diagnosis not present

## 2017-03-20 DIAGNOSIS — Z86010 Personal history of colonic polyps: Secondary | ICD-10-CM | POA: Diagnosis not present

## 2017-03-20 MED FILL — SPIRONOLACTONE 50 MG TAB: 50 | 30 days supply | Qty: 30 | Fill #4

## 2017-03-20 MED FILL — FUROSEMIDE 20 MG TABLET: 20 | 20 days supply | Qty: 40 | Fill #1

## 2017-04-04 ENCOUNTER — Other Ambulatory Visit: Payer: Self-pay | Admitting: Family Medicine

## 2017-04-04 MED FILL — FENOFIBRATE 134 MG CAPSULE: 134 | 30 days supply | Qty: 30 | Fill #0

## 2017-04-06 MED FILL — PEG-3350 SOLUTION: 420 | 1 days supply | Qty: 4000 | Fill #0

## 2017-04-09 DIAGNOSIS — R12 Heartburn: Secondary | ICD-10-CM | POA: Diagnosis not present

## 2017-04-10 ENCOUNTER — Other Ambulatory Visit: Payer: Self-pay | Admitting: Family Medicine

## 2017-04-11 MED FILL — FUROSEMIDE 20 MG TABLET: 20 | 20 days supply | Qty: 40 | Fill #0

## 2017-04-18 ENCOUNTER — Other Ambulatory Visit: Payer: Self-pay | Admitting: Family Medicine

## 2017-04-18 ENCOUNTER — Encounter: Payer: Self-pay | Admitting: Family Medicine

## 2017-04-20 ENCOUNTER — Other Ambulatory Visit: Payer: Self-pay | Admitting: Family Medicine

## 2017-04-20 MED ORDER — VITAMIN D (ERGOCALCIFEROL) 1.25 MG (50000 UNIT) PO CAPS: ORAL_CAPSULE | ORAL | 1 refills | Status: DC

## 2017-04-20 MED FILL — VIT D2 1.25 MG (50,000 UNIT: 1.25 MG | 84 days supply | Qty: 12 | Fill #0

## 2017-04-25 MED FILL — MODAFINIL 200 MG TABLET: 200 | 30 days supply | Qty: 45 | Fill #3

## 2017-04-25 MED FILL — SPIRONOLACTONE 50 MG TAB: 50 | 30 days supply | Qty: 30 | Fill #5

## 2017-04-30 DIAGNOSIS — M5136 Other intervertebral disc degeneration, lumbar region: Secondary | ICD-10-CM | POA: Diagnosis not present

## 2017-04-30 DIAGNOSIS — M9903 Segmental and somatic dysfunction of lumbar region: Secondary | ICD-10-CM | POA: Diagnosis not present

## 2017-04-30 DIAGNOSIS — M9902 Segmental and somatic dysfunction of thoracic region: Secondary | ICD-10-CM | POA: Diagnosis not present

## 2017-04-30 DIAGNOSIS — M9905 Segmental and somatic dysfunction of pelvic region: Secondary | ICD-10-CM | POA: Diagnosis not present

## 2017-05-04 MED FILL — FUROSEMIDE 20 MG TABS: 20 | 20 days supply | Qty: 40 | Fill #1

## 2017-05-09 MED FILL — FENOFIBRATE 134 MG CAPSULE: 134 | 30 days supply | Qty: 30 | Fill #1

## 2017-05-10 ENCOUNTER — Telehealth: Payer: 59 | Admitting: Family

## 2017-05-10 DIAGNOSIS — J029 Acute pharyngitis, unspecified: Secondary | ICD-10-CM

## 2017-05-10 MED ORDER — PREDNISONE 5 MG PO TABS: 5.0000 mg | ORAL_TABLET | ORAL | 0 refills | Status: DC

## 2017-05-10 MED ORDER — BENZONATATE 100 MG PO CAPS: 100.0000 mg | ORAL_CAPSULE | Freq: Three times a day (TID) | ORAL | 0 refills | Status: DC | PRN

## 2017-05-10 MED FILL — BENZONATATE 100 MG CAPS: 100 | 5 days supply | Qty: 30 | Fill #0

## 2017-05-10 MED FILL — predniSONE 5 MG TABS: 5 | 6 days supply | Qty: 21 | Fill #0

## 2017-05-10 NOTE — Progress Notes (Signed)
Thank you for the details you included in the comment boxes. Those details are very helpful in determining the best course of treatment for you and help [place] to provide the best care.  We are sorry that you are not feeling well.  Here is how we plan to help!  Based on your presentation I believe you most likely have A cough due to a virus or irritation of your throat from your other conditions.  This is called viral bronchitis and is best treated by rest, plenty of fluids and control of the cough.  You may use Ibuprofen or Tylenol as directed to help your symptoms.     In addition you may use A non-prescription cough medication called Mucinex DM: take 2 tablets every 12 hours. and A prescription cough medication called Tessalon Perles 100mg . You may take 1-2 capsules every 8 hours as needed for your cough.  Sterapred 5 mg dosepak  From your responses in the eVisit questionnaire you describe inflammation in the upper respiratory tract which is causing a significant cough.  This is commonly called Bronchitis and has four common causes:    Allergies  Viral Infections  Acid Reflux  Bacterial Infection Allergies, viruses and acid reflux are treated by controlling symptoms or eliminating the cause. An example might be a cough caused by taking certain blood pressure medications. You stop the cough by changing the medication. Another example might be a cough caused by acid reflux. Controlling the reflux helps control the cough.  USE OF BRONCHODILATOR ("RESCUE") INHALERS: There is a risk from using your bronchodilator too frequently.  The risk is that over-reliance on a medication which only relaxes the muscles surrounding the breathing tubes can reduce the effectiveness of medications prescribed to reduce swelling and congestion of the tubes themselves.  Although you feel brief relief from the bronchodilator inhaler, your asthma may actually be worsening with the tubes becoming more swollen and filled  with mucus.  This can delay other crucial treatments, such as oral steroid medications. If you need to use a bronchodilator inhaler daily, several times per day, you should discuss this with your provider.  There are probably better treatments that could be used to keep your asthma under control.     HOME CARE . Only take medications as instructed by your medical team. . Complete the entire course of an antibiotic. . Drink plenty of fluids and get plenty of rest. . Avoid close contacts especially the very young and the elderly . Cover your mouth if you cough or cough into your sleeve. . Always remember to wash your hands . A steam or ultrasonic humidifier can help congestion.   GET HELP RIGHT AWAY IF: . You develop worsening fever. . You become short of breath . You cough up blood. . Your symptoms persist after you have completed your treatment plan MAKE SURE YOU   Understand these instructions.  Will watch your condition.  Will get help right away if you are not doing well or get worse.  Your e-visit answers were reviewed by a board certified advanced clinical practitioner to complete your personal care plan.  Depending on the condition, your plan could have included both over the counter or prescription medications. If there is a problem please reply  once you have received a response from your provider. Your safety is important to [place].  If you have drug allergies check your prescription carefully.    You can use MyChart to ask questions about today's visit, request  a non-urgent call back, or ask for a work or school excuse for 24 hours related to this e-Visit. If it has been greater than 24 hours you will need to follow up with your provider, or enter a new e-Visit to address those concerns. You will get an e-mail in the next two days asking about your experience.  I hope that your e-visit has been valuable and will speed your recovery. Thank you for using e-visits.

## 2017-05-23 ENCOUNTER — Other Ambulatory Visit (INDEPENDENT_AMBULATORY_CARE_PROVIDER_SITE_OTHER): Payer: 59

## 2017-05-23 DIAGNOSIS — E782 Mixed hyperlipidemia: Secondary | ICD-10-CM

## 2017-05-23 LAB — CBC WITH DIFFERENTIAL/PLATELET
Basophils Absolute: 0 K/uL (ref 0.0–0.1)
Basophils Relative: 0.7 % (ref 0.0–3.0)
Eosinophils Absolute: 0.1 K/uL (ref 0.0–0.7)
Eosinophils Relative: 2.5 % (ref 0.0–5.0)
HCT: 41 % (ref 36.0–46.0)
Hemoglobin: 13.8 g/dL (ref 12.0–15.0)
Lymphocytes Relative: 37.6 % (ref 12.0–46.0)
Lymphs Abs: 1.9 K/uL (ref 0.7–4.0)
MCHC: 33.5 g/dL (ref 30.0–36.0)
MCV: 90 fl (ref 78.0–100.0)
Monocytes Absolute: 0.5 K/uL (ref 0.1–1.0)
Monocytes Relative: 9.8 % (ref 3.0–12.0)
Neutro Abs: 2.5 K/uL (ref 1.4–7.7)
Neutrophils Relative %: 49.4 % (ref 43.0–77.0)
Platelets: 378 K/uL (ref 150.0–400.0)
RBC: 4.56 Mil/uL (ref 3.87–5.11)
RDW: 14.2 % (ref 11.5–15.5)
WBC: 5 K/uL (ref 4.0–10.5)

## 2017-05-23 LAB — LIPID PANEL
Cholesterol: 202 mg/dL — ABNORMAL HIGH (ref 0–200)
HDL: 47.2 mg/dL (ref >39.00)
LDL Cholesterol: 132 mg/dL — ABNORMAL HIGH (ref 0–99)
NonHDL: 155.05
Total CHOL/HDL Ratio: 4
Triglycerides: 115 mg/dL (ref 0.0–149.0)
VLDL: 23 mg/dL (ref 0.0–40.0)

## 2017-05-23 LAB — TSH: TSH: 1.11 u[IU]/mL (ref 0.35–4.50)

## 2017-05-23 LAB — VITAMIN D 25 HYDROXY (VIT D DEFICIENCY, FRACTURES): VITD: 43.39 ng/mL (ref 30.00–100.00)

## 2017-05-23 LAB — URIC ACID: Uric Acid, Serum: 6.1 mg/dL (ref 2.4–7.0)

## 2017-05-23 LAB — HEMOGLOBIN A1C: Hgb A1c MFr Bld: 6.3 % (ref 4.6–6.5)

## 2017-05-24 ENCOUNTER — Other Ambulatory Visit: Payer: Self-pay | Admitting: Family Medicine

## 2017-05-24 MED FILL — FUROSEMIDE 20 MG TABS: 20 | 20 days supply | Qty: 40 | Fill #0

## 2017-05-25 MED FILL — BYSTOLIC 20 MG TABLET: 20 | 90 days supply | Qty: 90 | Fill #1

## 2017-05-25 MED FILL — SPIRONOLACTONE 50 MG TABLET: 50 | 30 days supply | Qty: 30 | Fill #0

## 2017-05-28 ENCOUNTER — Ambulatory Visit: Payer: Self-pay | Admitting: Family Medicine

## 2017-06-01 ENCOUNTER — Other Ambulatory Visit: Payer: Self-pay | Admitting: Acute Care

## 2017-06-01 ENCOUNTER — Ambulatory Visit: Payer: Self-pay | Admitting: Family Medicine

## 2017-06-01 IMAGING — DX DG CERVICAL SPINE 2 OR 3 VIEWS
3 series · 3 of 3 positions shown · non-contrast
Comparison: 12/09/2013

CLINICAL DATA: Neck pain. Intermittent numbness on left side of
neck into left upper extremity.

EXAM:
CERVICAL SPINE - 2-3 VIEW

[cervical spine ap]
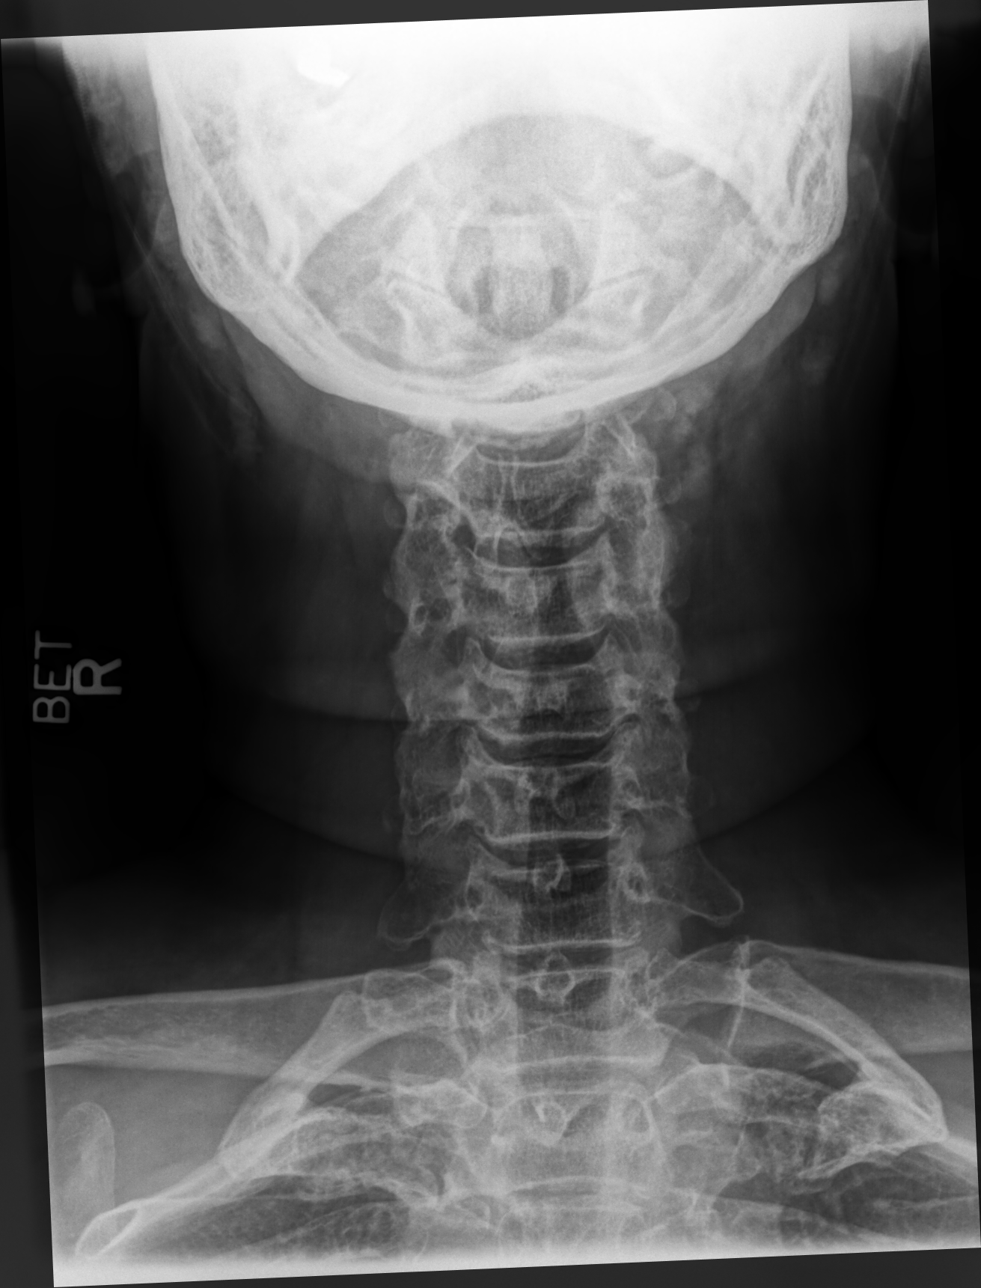

[cervical spine lat (1 of 2)]
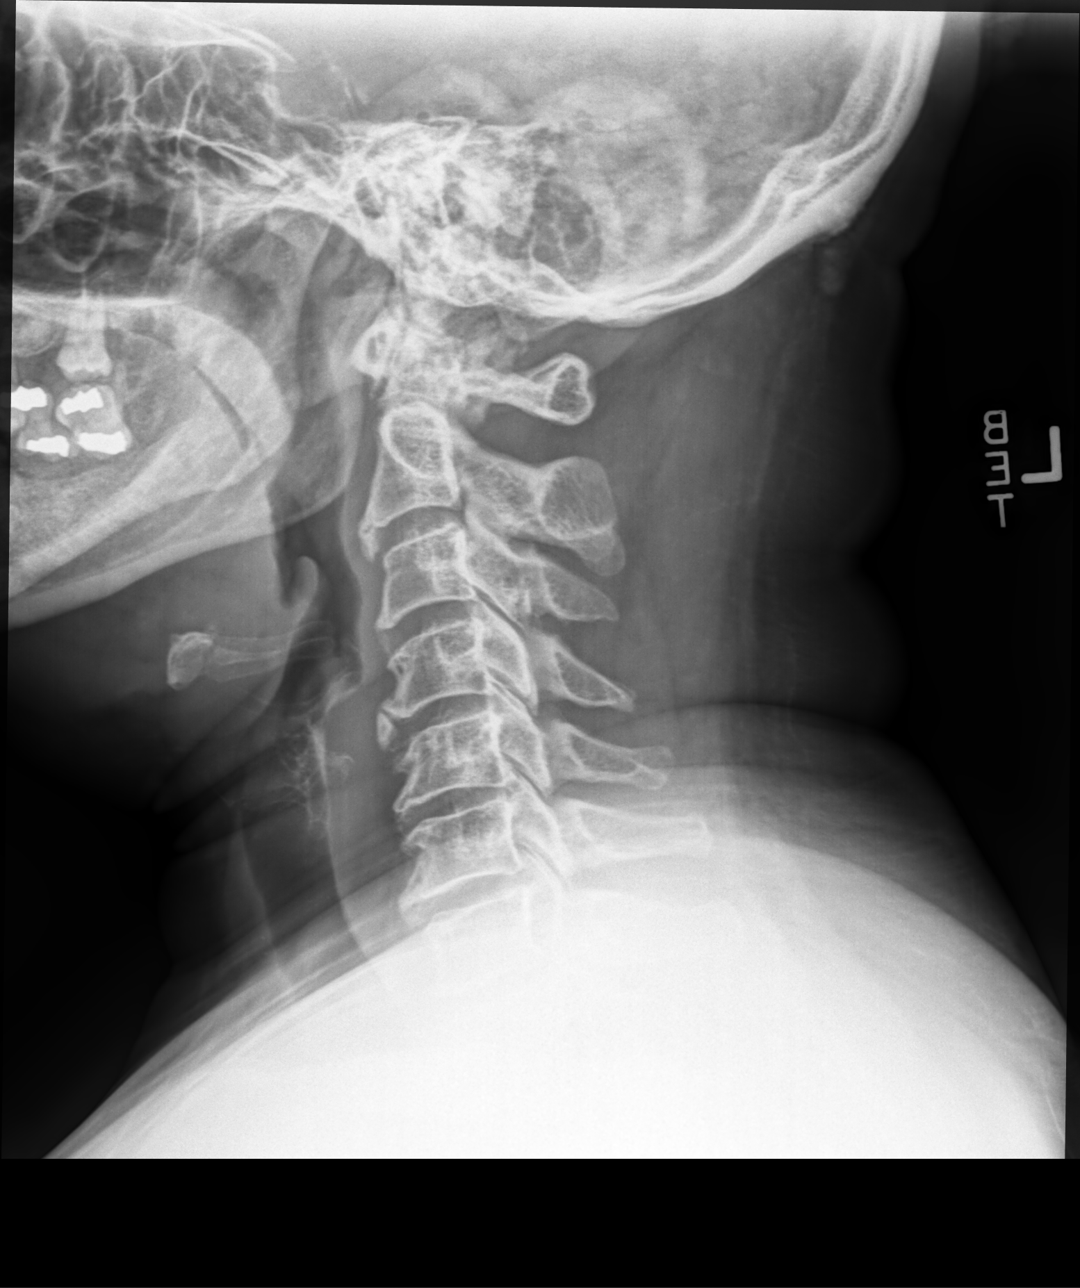

[cervical spine lat (2 of 2)]
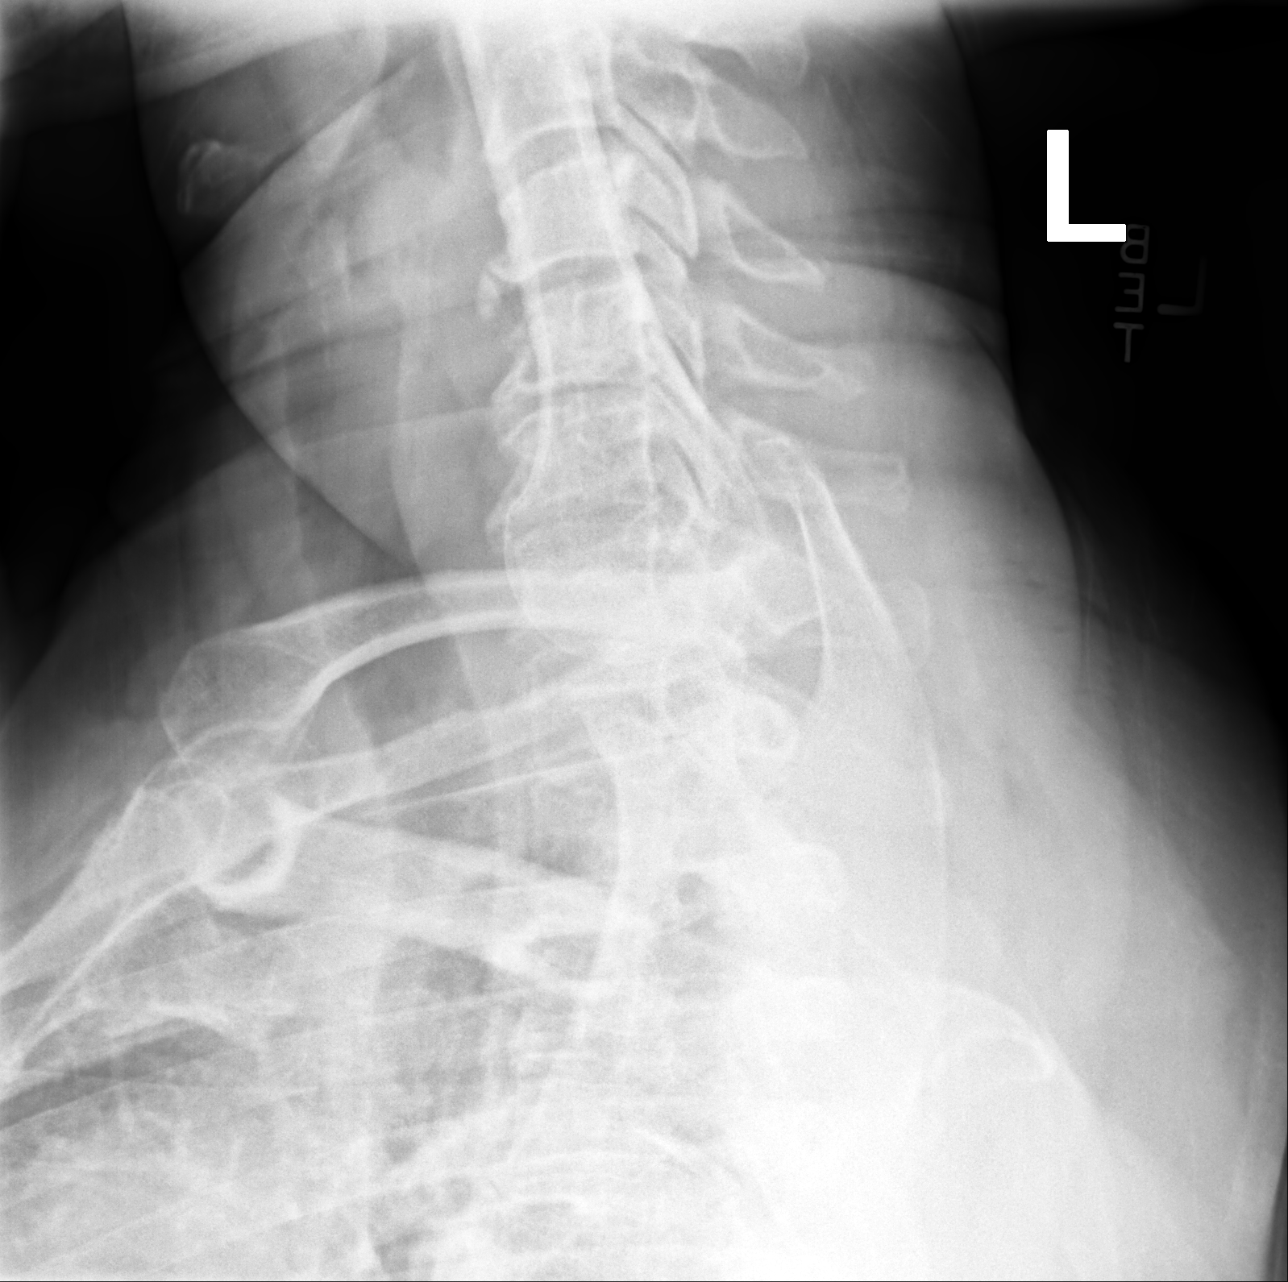

[3 of 3 positions shown; findings below may reference images not displayed]

FINDINGS: Degenerative changes throughout the lumbar spine with anterior
spurring. Mild diffuse facet disease bilaterally. Normal alignment.
No fracture. Prevertebral soft tissues are normal.
IMPRESSION: Mild degenerative disc and facet disease throughout the cervical
spine. No acute findings. No significant change since prior study.

## 2017-06-05 ENCOUNTER — Other Ambulatory Visit: Payer: Self-pay | Admitting: Acute Care

## 2017-06-05 ENCOUNTER — Other Ambulatory Visit: Payer: Self-pay | Admitting: Family Medicine

## 2017-06-05 MED FILL — MODAFINIL 200 MG TABLET: 200 | 30 days supply | Qty: 45 | Fill #0

## 2017-06-05 MED FILL — FENOFIBRATE 134 MG CAPSULE: 134 | 30 days supply | Qty: 30 | Fill #0

## 2017-06-05 MED FILL — SYNTHROID 100 MCG TABLET: 100 | 90 days supply | Qty: 90 | Fill #0

## 2017-06-08 DIAGNOSIS — M9902 Segmental and somatic dysfunction of thoracic region: Secondary | ICD-10-CM | POA: Diagnosis not present

## 2017-06-08 DIAGNOSIS — M5136 Other intervertebral disc degeneration, lumbar region: Secondary | ICD-10-CM | POA: Diagnosis not present

## 2017-06-08 DIAGNOSIS — M9903 Segmental and somatic dysfunction of lumbar region: Secondary | ICD-10-CM | POA: Diagnosis not present

## 2017-06-08 DIAGNOSIS — M9905 Segmental and somatic dysfunction of pelvic region: Secondary | ICD-10-CM | POA: Diagnosis not present

## 2017-06-18 MED FILL — FUROSEMIDE 20 MG TABS: 20 | 20 days supply | Qty: 40 | Fill #1

## 2017-06-20 ENCOUNTER — Encounter (INDEPENDENT_AMBULATORY_CARE_PROVIDER_SITE_OTHER): Payer: 59

## 2017-06-26 ENCOUNTER — Encounter: Payer: Self-pay | Admitting: Family Medicine

## 2017-06-26 ENCOUNTER — Ambulatory Visit: Payer: 59 | Admitting: Family Medicine

## 2017-06-26 VITALS — BP 120/68 | HR 75 | Temp 98.4°F | Resp 16 | Ht 64.57 in | Wt 251.0 lb

## 2017-06-26 DIAGNOSIS — I10 Essential (primary) hypertension: Secondary | ICD-10-CM | POA: Diagnosis not present

## 2017-06-26 DIAGNOSIS — E782 Mixed hyperlipidemia: Secondary | ICD-10-CM

## 2017-06-26 DIAGNOSIS — G4733 Obstructive sleep apnea (adult) (pediatric): Secondary | ICD-10-CM

## 2017-06-26 DIAGNOSIS — Z1231 Encounter for screening mammogram for malignant neoplasm of breast: Secondary | ICD-10-CM | POA: Diagnosis not present

## 2017-06-26 DIAGNOSIS — R05 Cough: Secondary | ICD-10-CM

## 2017-06-26 DIAGNOSIS — E559 Vitamin D deficiency, unspecified: Secondary | ICD-10-CM

## 2017-06-26 DIAGNOSIS — R059 Cough, unspecified: Secondary | ICD-10-CM

## 2017-06-26 DIAGNOSIS — Z1239 Encounter for other screening for malignant neoplasm of breast: Secondary | ICD-10-CM

## 2017-06-26 MED ORDER — GABAPENTIN 300 MG PO CAPS: 300.0000 mg | ORAL_CAPSULE | Freq: Every day | ORAL | 3 refills | Status: DC

## 2017-06-26 MED ORDER — ALBUTEROL SULFATE HFA 108 (90 BASE) MCG/ACT IN AERS: INHALATION_SPRAY | RESPIRATORY_TRACT | 6 refills | Status: DC

## 2017-06-26 MED FILL — VENTOLIN HFA 90 MCG INHALER: 108 (90 BAS | 17 days supply | Qty: 18 | Fill #0

## 2017-06-26 NOTE — Patient Instructions (Addendum)
[place] tea   Cough, Adult Coughing is a reflex that clears your throat and your airways. Coughing helps to heal and protect your lungs. It is normal to cough occasionally, but a cough that happens with other symptoms or lasts a long time may be a sign of a condition that needs treatment. A cough may last only 2-3 weeks (acute), or it may last longer than 8 weeks (chronic). What are the causes? Coughing is commonly caused by:  Breathing in substances that irritate your lungs.  A viral or bacterial respiratory infection.  Allergies.  Asthma.  Postnasal drip.  Smoking.  Acid backing up from the stomach into the esophagus (gastroesophageal reflux).  Certain medicines.  Chronic lung problems, including COPD (or rarely, lung cancer).  Other medical conditions such as heart failure.  Follow these instructions at home: Pay attention to any changes in your symptoms. Take these actions to help with your discomfort:  Take medicines only as told by your health care provider. ? If you were prescribed an antibiotic medicine, take it as told by your health care provider. Do not stop taking the antibiotic even if you start to feel better. ? Talk with your health care provider before you take a cough suppressant medicine.  Drink enough fluid to keep your urine clear or pale yellow.  If the air is dry, use a cold steam vaporizer or humidifier in your bedroom or your home to help loosen secretions.  Avoid anything that causes you to cough at work or at home.  If your cough is worse at night, try sleeping in a semi-upright position.  Avoid cigarette smoke. If you smoke, quit smoking. If you need help quitting, ask your health care provider.  Avoid caffeine.  Avoid alcohol.  Rest as needed.  Contact a health care provider if:  You have new symptoms.  You cough up pus.  Your cough does not get better after 2-3 weeks, or your cough gets worse.  You cannot control your cough  with suppressant medicines and you are losing sleep.  You develop pain that is getting worse or pain that is not controlled with pain medicines.  You have a fever.  You have unexplained weight loss.  You have night sweats. Get help right away if:  You cough up blood.  You have difficulty breathing.  Your heartbeat is very fast. This information is not intended to replace advice given to you by your health care provider. Make sure you discuss any questions you have with your health care provider. Document Released: 10/07/2010 Document Revised: 09/16/2015 Document Reviewed: 06/17/2014 Elsevier Interactive Patient Education  [employer].

## 2017-06-26 NOTE — Progress Notes (Signed)
Subjective:  I acted as a Education administrator for [employer]. Yancey Flemings, [place]   Patient ID: [name], female    DOB: 10-16-1960, 57 y.o.   MRN: [id number]  Chief Complaint  Patient presents with  . Follow-up    HPI  Patient is in today for 3 month follow up visit. No recent illness but she does still note a chronic cough. It is not keeping her up at night. She is noting some pain in her left axillae with coughing at times. No recent fall or injury. Cough is not productive. She feels the Provigil has helped her to keep working. She continues to struggle with fatigue. Denies CP/palp/HA/congestion/fevers/GI or GU c/o. Taking meds as prescribed  Patient Care Team: Mosie Lukes, MD as PCP - General (Family Medicine) Molli Posey, MD as Consulting Physician (Obstetrics and Gynecology) Virgina Evener, OD as Referring Physician (Optometry)   Past Medical History:  Diagnosis Date  . Acute bronchitis 05/25/2016  . Anxiety   . Chronic pain syndrome 01/06/2013  . Chronic rhinosinusitis   . Colon polyps   . Cough 01/06/2013  . Depression   . Diabetes ([place]) 01/06/2013  . Diabetes mellitus type 2 in obese [facility]) 01/06/2013   Sees Dr Calvert Cantor for eye exam Does not see podiatry, foot exam today unremarkable except for thick cracking skin on heals   . Dysuria 05/25/2016  . Edema 01/06/2013  . Epistaxis 05/25/2016  . Fibroid, uterine   . GERD (gastroesophageal reflux disease)   . Glaucoma 12-13  . Hoarseness   . Hoarseness of voice 05/11/2013  . Hyperglycemia 04/13/2013  . Hyperlipemia, mixed 06/06/2007   Qualifier: Diagnosis of  By: Wynona Luna She feels Lipitor caused increased low back pain and weakness    . Hyperlipidemia   . Hypertension   . Hypokalemia 02/05/2013  . IBS (irritable bowel syndrome) 02/13/2011  . Low back pain 03/03/2013  . Muscle cramp 05/22/2014  . Obesity   . Obesity, unspecified 05/11/2013  . OSA (obstructive sleep apnea)   . Pain in joint, shoulder region 06/27/2015  .  Perimenopause 10/05/2012  . Raynaud disease 06/16/2013  . Thyroid disease    hypothyroidism    Past Surgical History:  Procedure Laterality Date  . ABDOMINAL HYSTERECTOMY    . CHOLECYSTECTOMY      Family History  Problem Relation Age of Onset  . Alcohol abuse Father   . Cancer Father        renal and colon  . Hyperlipidemia Father   . Hypertension Father   . Cancer Paternal Grandfather        colon  . Diabetes Unknown     Social History   Socioeconomic History  . Marital status: Significant Other    Spouse name: Not on file  . Number of children: Not on file  . Years of education: Not on file  . Highest education level: Not on file  Social Needs  . Financial resource strain: Not on file  . Food insecurity - worry: Not on file  . Food insecurity - inability: Not on file  . Transportation needs - medical: Not on file  . Transportation needs - non-medical: Not on file  Occupational History  . Not on file  Tobacco Use  . Smoking status: Former Smoker    Packs/day: 0.50    Years: 30.00    Pack years: 15.00    Types: Cigarettes    Last attempt to quit: 04/25/2003    Years since  quitting: 14.1  . Smokeless tobacco: Never Used  . Tobacco comment: smoked since age 70  Substance and Sexual Activity  . Alcohol use: No  . Drug use: Not on file  . Sexual activity: Not on file  Other Topics Concern  . Not on file  Social History Narrative   Endo-- Dr Dwyane Dee   ENT--Dr shoemaker   GI--Dr Buccini   Pulm--Dr Clance   Rheum--Dr Charlestine Night          Outpatient Medications Prior to Visit  Medication Sig Dispense Refill  . benzonatate (TESSALON PERLES) 100 MG capsule Take 1-2 capsules (100-200 mg total) by mouth every 8 (eight) hours as needed for cough. 30 capsule 0  . BYSTOLIC 20 MG TABS TAKE 1 TABLET BY MOUTH ONCE DAILY 90 tablet 2  . fenofibrate micronized (LOFIBRA) 134 MG capsule TAKE 1 CAPSULE BY MOUTH DAILY BEFORE BREAKFAST. 30 capsule 0  . furosemide (LASIX) 20 MG  tablet TAKE 1 TO 2 TABLETS BY MOUTH DAILY AS NEEDED FOR FLUID OR EDEMA 40 tablet 1  . metFORMIN (GLUCOPHAGE-XR) 500 MG 24 hr tablet TAKE 2 TABLETS BY MOUTH DAILY WITH BREAKFAST. 60 tablet PRN  . modafinil (PROVIGIL) 200 MG tablet TAKE 1 & 1/2 TABLET BY MOUTH DAILY 45 tablet 2  . predniSONE (DELTASONE) 5 MG tablet Take 1 tablet (5 mg total) by mouth as directed. sterapred generic 21 tablet 0  . ranitidine (ZANTAC) 300 MG tablet Take 1 tablet (300 mg total) at bedtime by mouth. 30 tablet 3  . spironolactone (ALDACTONE) 50 MG tablet Take 1 tablet (50 mg total) by mouth daily. 30 tablet 5  . SYNTHROID 100 MCG tablet Take 1 tablet (100 mcg total) by mouth daily before breakfast. 90 tablet 1  . Vitamin D, Ergocalciferol, (DRISDOL) 50000 units CAPS capsule TAKE 1 CAPSULE BY MOUTH EVERY 7 DAYS 12 capsule 1  . VENTOLIN HFA 108 (90 Base) MCG/ACT inhaler INHALE 2 PUFFS INTO THE LUNGS EVERY 4 HOURS AS NEEDED FOR WHEEZING OR SHORTNESS OF BREATH. 18 g 6   No facility-administered medications prior to visit.     Allergies  Allergen Reactions  . Losartan Cough  . Aspirin Other (See Comments)    GI upset REACTION: Upset stomach  . Atorvastatin     myalgias  . Codeine   . Amoxicillin Rash  . Erythromycin Rash    Review of Systems  Constitutional: Positive for malaise/fatigue. Negative for fever.  HENT: Negative for congestion.   Eyes: Negative for blurred vision.  Respiratory: Positive for cough. Negative for shortness of breath.   Cardiovascular: Negative for chest pain, palpitations and leg swelling.  Gastrointestinal: Negative for abdominal pain, blood in stool and nausea.  Genitourinary: Negative for dysuria and frequency.  Musculoskeletal: Negative for falls.  Skin: Negative for rash.  Neurological: Negative for dizziness, loss of consciousness and headaches.  Endo/Heme/Allergies: Negative for environmental allergies.  Psychiatric/Behavioral: Negative for depression. The patient is not  nervous/anxious.        Objective:    Physical Exam  Constitutional: She is oriented to person, place, and time. She appears well-developed and well-nourished. No distress.  HENT:  Head: Normocephalic and atraumatic.  Nose: Nose normal.  Eyes: Right eye exhibits no discharge. Left eye exhibits no discharge.  Neck: Normal range of motion. Neck supple.  Cardiovascular: Normal rate and regular rhythm.  No murmur heard. Pulmonary/Chest: Effort normal and breath sounds normal.  Abdominal: Soft. Bowel sounds are normal. She exhibits no distension and no mass. There is  no tenderness. There is no rebound and no guarding.  Umbilical hernia noted, reducible soft, nontender  Musculoskeletal: She exhibits no edema.  Neurological: She is alert and oriented to person, place, and time.  Skin: Skin is warm and dry.  Psychiatric: She has a normal mood and affect.  Nursing note and vitals reviewed.   BP 120/68 (BP Location: Left Arm, Patient Position: Sitting, Cuff Size: Large)   Pulse 75   Temp 98.4 F (36.9 C) (Oral)   Resp 16   Ht 5' 4.57" (1.64 m)   Wt 251 lb (113.9 kg)   SpO2 97%   BMI 42.33 kg/m  Wt Readings from Last 3 Encounters:  06/26/17 251 lb (113.9 kg)  03/01/17 244 lb 9.6 oz (110.9 kg)  02/05/17 248 lb 3.2 oz (112.6 kg)   BP Readings from Last 3 Encounters:  06/26/17 120/68  03/01/17 122/78  02/05/17 140/88     Immunization History  Administered Date(s) Administered  . Influenza Whole 01/23/2008, 01/28/2009, 02/16/2010  . Influenza,inj,Quad PF,6+ Mos 01/06/2013, 01/10/2016  . Influenza-Unspecified 01/22/2014, 02/09/2015, 01/17/2017  . Pneumococcal Conjugate-13 05/22/2014  . Pneumococcal Polysaccharide-23 03/22/2015  . Td 12/18/2000  . Tdap 02/10/2014    Health Maintenance  Topic Date Due  . OPHTHALMOLOGY EXAM  02/04/2014  . FOOT EXAM  08/19/2016  . MAMMOGRAM  06/27/2017  . URINE MICROALBUMIN  10/11/2017  . HEMOGLOBIN A1C  11/20/2017  . PAP SMEAR  06/29/2018   . PNEUMOCOCCAL POLYSACCHARIDE VACCINE (2) 03/21/2020  . COLONOSCOPY  04/05/2022  . TETANUS/TDAP  02/11/2024  . INFLUENZA VACCINE  Completed  . Hepatitis C Screening  Completed  . HIV Screening  Completed    Lab Results  Component Value Date   WBC 5.0 05/23/2017   HGB 13.8 05/23/2017   HCT 41.0 05/23/2017   PLT 378.0 05/23/2017   GLUCOSE 89 02/22/2017   CHOL 202 (H) 05/23/2017   TRIG 115.0 05/23/2017   HDL 47.20 05/23/2017   LDLDIRECT 103.0 07/11/2016   LDLCALC 132 (H) 05/23/2017   ALT 18 02/22/2017   AST 15 02/22/2017   NA 139 02/22/2017   K 3.8 02/22/2017   CL 105 02/22/2017   CREATININE 0.93 02/22/2017   BUN 11 02/22/2017   CO2 27 02/22/2017   TSH 1.11 05/23/2017   HGBA1C 6.3 05/23/2017   MICROALBUR 0.7 10/11/2016    Lab Results  Component Value Date   TSH 1.11 05/23/2017   Lab Results  Component Value Date   WBC 5.0 05/23/2017   HGB 13.8 05/23/2017   HCT 41.0 05/23/2017   MCV 90.0 05/23/2017   PLT 378.0 05/23/2017   Lab Results  Component Value Date   NA 139 02/22/2017   K 3.8 02/22/2017   CO2 27 02/22/2017   GLUCOSE 89 02/22/2017   BUN 11 02/22/2017   CREATININE 0.93 02/22/2017   BILITOT 0.2 02/22/2017   ALKPHOS 56 02/22/2017   AST 15 02/22/2017   ALT 18 02/22/2017   PROT 7.2 02/22/2017   ALBUMIN 4.3 02/22/2017   CALCIUM 10.2 02/22/2017   GFR 80.13 02/22/2017   Lab Results  Component Value Date   CHOL 202 (H) 05/23/2017   Lab Results  Component Value Date   HDL 47.20 05/23/2017   Lab Results  Component Value Date   LDLCALC 132 (H) 05/23/2017   Lab Results  Component Value Date   TRIG 115.0 05/23/2017   Lab Results  Component Value Date   CHOLHDL 4 05/23/2017   Lab Results  Component Value Date  HGBA1C 6.3 05/23/2017         Assessment & Plan:   Problem List Items Addressed This Visit    Hyperlipemia, mixed    Encouraged heart healthy diet, increase exercise, avoid trans fats, consider a krill oil cap daily      OSA  (obstructive sleep apnea)    Doing well with CPAP and feels better with addition of Provigil      Essential hypertension    Well controlled, no changes to meds. Encouraged heart healthy diet such as the DASH diet and exercise as tolerated.       Vitamin D deficiency    WNL at last check encouraged daily supplements.       Cough    She continues to struggle with a cough and she is willing to try taking Gabapentin again. She noted excessive fatigue but since stopping it her fatigue has not improved. Will restart at 300 mg qhs.       Morbid obesity ([place])    Encouraged DASH diet, decrease po intake and increase exercise as tolerated. Needs 7-8 hours of sleep nightly. Avoid trans fats, eat small, frequent meals every 4-5 hours with lean proteins, complex carbs and healthy fats. Minimize simple carbs, offered bariatric referral again       Other Visit Diagnoses    Breast cancer screening    -  Primary   Relevant Orders   MM DIAG BREAST TOMO BILATERAL      I have changed [name]'s VENTOLIN HFA to albuterol. I am also having her start on gabapentin. Additionally, I am having her maintain her metFORMIN, spironolactone, SYNTHROID, BYSTOLIC, ranitidine, Vitamin D (Ergocalciferol), benzonatate, predniSONE, furosemide, modafinil, and fenofibrate micronized.  Meds ordered this encounter  Medications  . albuterol (VENTOLIN HFA) 108 (90 Base) MCG/ACT inhaler    Sig: INHALE 2 PUFFS INTO THE LUNGS EVERY 4 HOURS AS NEEDED FOR WHEEZING OR SHORTNESS OF BREATH.    Dispense:  18 g    Refill:  6  . gabapentin (NEURONTIN) 300 MG capsule    Sig: Take 1 capsule (300 mg total) by mouth at bedtime.    Dispense:  30 capsule    Refill:  3    CMA served as scribe during this visit. History, Physical and Plan performed by medical provider. Documentation and orders reviewed and attested to.  Penni Homans, MD

## 2017-06-27 NOTE — Assessment & Plan Note (Signed)
Doing well with CPAP and feels better with addition of Provigil

## 2017-06-27 NOTE — Assessment & Plan Note (Signed)
Well controlled, no changes to meds. Encouraged heart healthy diet such as the DASH diet and exercise as tolerated.  °

## 2017-06-27 NOTE — Assessment & Plan Note (Signed)
Encouraged heart healthy diet, increase exercise, avoid trans fats, consider a krill oil cap daily 

## 2017-06-27 NOTE — Assessment & Plan Note (Signed)
She continues to struggle with a cough and she is willing to try taking Gabapentin again. She noted excessive fatigue but since stopping it her fatigue has not improved. Will restart at 300 mg qhs.

## 2017-06-27 NOTE — Assessment & Plan Note (Signed)
WNL at last check encouraged daily supplements.

## 2017-06-27 NOTE — Assessment & Plan Note (Signed)
Encouraged DASH diet, decrease po intake and increase exercise as tolerated. Needs 7-8 hours of sleep nightly. Avoid trans fats, eat small, frequent meals every 4-5 hours with lean proteins, complex carbs and healthy fats. Minimize simple carbs, offered bariatric referral again

## 2017-07-03 ENCOUNTER — Encounter (INDEPENDENT_AMBULATORY_CARE_PROVIDER_SITE_OTHER): Payer: Self-pay | Admitting: Family Medicine

## 2017-07-03 ENCOUNTER — Ambulatory Visit (INDEPENDENT_AMBULATORY_CARE_PROVIDER_SITE_OTHER): Payer: 59 | Admitting: Family Medicine

## 2017-07-03 VITALS — BP 125/79 | HR 68 | Temp 98.1°F | Ht 64.0 in | Wt 247.0 lb

## 2017-07-03 DIAGNOSIS — R5383 Other fatigue: Secondary | ICD-10-CM | POA: Diagnosis not present

## 2017-07-03 DIAGNOSIS — R0602 Shortness of breath: Secondary | ICD-10-CM | POA: Diagnosis not present

## 2017-07-03 DIAGNOSIS — R531 Weakness: Secondary | ICD-10-CM | POA: Insufficient documentation

## 2017-07-03 DIAGNOSIS — Z9189 Other specified personal risk factors, not elsewhere classified: Secondary | ICD-10-CM | POA: Diagnosis not present

## 2017-07-03 DIAGNOSIS — Z6841 Body Mass Index (BMI) 40.0 and over, adult: Secondary | ICD-10-CM | POA: Diagnosis not present

## 2017-07-03 DIAGNOSIS — E559 Vitamin D deficiency, unspecified: Secondary | ICD-10-CM | POA: Diagnosis not present

## 2017-07-03 DIAGNOSIS — Z0289 Encounter for other administrative examinations: Secondary | ICD-10-CM

## 2017-07-03 DIAGNOSIS — E66813 Obesity, class 3: Secondary | ICD-10-CM

## 2017-07-03 DIAGNOSIS — E038 Other specified hypothyroidism: Secondary | ICD-10-CM | POA: Diagnosis not present

## 2017-07-03 DIAGNOSIS — Z1331 Encounter for screening for depression: Secondary | ICD-10-CM | POA: Diagnosis not present

## 2017-07-03 DIAGNOSIS — R7303 Prediabetes: Secondary | ICD-10-CM | POA: Diagnosis not present

## 2017-07-03 MED FILL — SPIRONOLACTONE 25 MG TABLET: 25 | 30 days supply | Qty: 60 | Fill #0

## 2017-07-03 NOTE — Progress Notes (Signed)
Office: [phone]  /  Fax: [phone]   Dear Dr. Charlett Howard,   Thank you for referring [name] to our clinic. The following note includes my evaluation and treatment recommendations.  HPI:   Chief Complaint: OBESITY    [name] has been referred by Gina Levine A. Gina Blake, MD for consultation regarding her obesity and obesity related comorbidities.    [name] (MR# [id number]) is a 57 y.o. female who presents on 07/03/2017 for obesity evaluation and treatment. Current BMI is Body mass index is 42.4 kg/m.Marland Kitchen [name] has been struggling with her weight for many years and has been unsuccessful in either losing weight, maintaining weight loss, or reaching her healthy weight goal.     [name] attended our information session and states she is currently in the action stage of change and ready to dedicate time achieving and maintaining a healthier weight. [name] is interested in becoming our patient and working on intensive lifestyle modifications including (but not limited to) diet, exercise and weight loss.    [name] states her family eats meals together she thinks her family will eat healthier with  her her desired weight loss is 47-52 lbs she started gaining weight in 2007 after she started having thyroid issues her heaviest weight ever was 260 lbs she is a picky eater and doesn't like to eat healthier foods  she skips meals frequently she has problems with excessive hunger she frequently eats larger portions than normal she has binge eating behaviors   [name] feels her energy is lower than it should be. This has worsened with weight gain and has not worsened recently. [name] admits to daytime somnolence and  admits to waking up still tired. Patient has a history of obstructive sleep apnea and on CPAP. [name] has a history of symptoms of daytime Gina and morning headache. Patient generally gets 7 hours of sleep per night, and states they generally have  generally restful sleep. Snoring is present. Apneic episodes are not present. Epworth Sleepiness Score is 5.  Dyspnea on exertion [name] notes increasing shortness of breath with exercising and seems to be worsening over time with weight gain. She notes getting out of breath sooner with activity than she used to. This has not gotten worse recently. [name] denies orthopnea.  Pre-Diabetes [name] has a diagnosis of pre-diabetes based on her elevated Hgb A1c and was informed this puts her at greater risk of developing diabetes. She has a history of polycystic ovary syndrome, on metformin. Last A1c at 6.3 and she notes polyphagia. [name] continues to work on diet and exercise to decrease risk of diabetes. She denies nausea or hypoglycemia.  At risk for diabetes [name] is at higher than average risk for developing diabetes due to her obesity and pre-diabetes. She currently denies polyuria or polydipsia.  Hypothyroid [name] has a diagnosis of hypothyroidism. She is on levothyroxine, still notes Gina. She denies hot or cold intolerance or palpitations. Her voice is somewhat hoarse but she has vocal cord dysfunction.  Vitamin D Deficiency [name] has a diagnosis of vitamin D deficiency. She is on Vit D prescription weekly, last level not at goal. She notes Gina and denies nausea, vomiting or muscle weakness.  Depression Screen [name]'s Food and Mood (modified PHQ-9) score was  Depression screen PHQ 2/9 07/03/2017  Decreased Interest 0  Down, Depressed, Hopeless 0  PHQ - 2 Score 0  Altered sleeping 1  Tired, decreased energy 2  Change in appetite 0  Feeling bad or failure about  yourself  0  Trouble concentrating 1  Moving slowly or fidgety/restless 0  Suicidal thoughts 0  PHQ-9 Score 4  Difficult doing work/chores Not difficult at all  Some recent data might be hidden    ALLERGIES: Allergies  Allergen Reactions  . Losartan Cough  . Aspirin Other (See Comments)    GI  upset REACTION: Upset stomach  . Atorvastatin     myalgias  . Codeine   . Amoxicillin Rash  . Erythromycin Rash    MEDICATIONS: Current Outpatient Medications on File Prior to Visit  Medication Sig Dispense Refill  . acetaminophen (TYLENOL) 325 MG tablet Take 650 mg by mouth every 6 (six) hours as needed.    Marland Kitchen albuterol (VENTOLIN HFA) 108 (90 Base) MCG/ACT inhaler INHALE 2 PUFFS INTO THE LUNGS EVERY 4 HOURS AS NEEDED FOR WHEEZING OR SHORTNESS OF BREATH. 18 g 6  . BYSTOLIC 20 MG TABS TAKE 1 TABLET BY MOUTH ONCE DAILY 90 tablet 2  . Cod Liver Oil 1000 MG CAPS Take 1 capsule by mouth daily.    . diphenhydrAMINE (BENADRYL) 25 MG tablet Take 25 mg by mouth every 6 (six) hours as needed.    . fenofibrate micronized (LOFIBRA) 134 MG capsule TAKE 1 CAPSULE BY MOUTH DAILY BEFORE BREAKFAST. 30 capsule 0  . furosemide (LASIX) 20 MG tablet TAKE 1 TO 2 TABLETS BY MOUTH DAILY AS NEEDED FOR FLUID OR EDEMA 40 tablet 1  . gabapentin (NEURONTIN) 300 MG capsule Take 1 capsule (300 mg total) by mouth at bedtime. 30 capsule 3  . loratadine (CLARITIN) 10 MG tablet Take 10 mg by mouth daily.    . metFORMIN (GLUCOPHAGE-XR) 500 MG 24 hr tablet TAKE 2 TABLETS BY MOUTH DAILY WITH BREAKFAST. 60 tablet PRN  . modafinil (PROVIGIL) 200 MG tablet TAKE 1 & 1/2 TABLET BY MOUTH DAILY 45 tablet 2  . spironolactone (ALDACTONE) 50 MG tablet Take 1 tablet (50 mg total) by mouth daily. 30 tablet 5  . SYNTHROID 100 MCG tablet Take 1 tablet (100 mcg total) by mouth daily before breakfast. 90 tablet 1  . Vitamin D, Ergocalciferol, (DRISDOL) 50000 units CAPS capsule TAKE 1 CAPSULE BY MOUTH EVERY 7 DAYS 12 capsule 1   No current facility-administered medications on file prior to visit.     PAST MEDICAL HISTORY: Past Medical History:  Diagnosis Date  . Acute bronchitis 05/25/2016  . Anxiety   . Chronic pain syndrome 01/06/2013  . Chronic rhinosinusitis   . Colon polyps   . Cough 01/06/2013  . Depression   . Diabetes ([place])  01/06/2013  . Diabetes mellitus type 2 in obese [facility]) 01/06/2013   Sees Dr Calvert Cantor for eye exam Does not see podiatry, foot exam today unremarkable except for thick cracking skin on heals   . Dysuria 05/25/2016  . Edema 01/06/2013  . Epistaxis 05/25/2016  . Fibroid, uterine   . GERD (gastroesophageal reflux disease)   . Glaucoma 12-13  . Hoarseness   . Hoarseness of voice 05/11/2013  . Hyperglycemia 04/13/2013  . Hyperlipemia, mixed 06/06/2007   Qualifier: Diagnosis of  By: Wynona Luna She feels Lipitor caused increased low back pain and weakness    . Hyperlipidemia   . Hypertension   . Hypokalemia 02/05/2013  . IBS (irritable bowel syndrome) 02/13/2011  . Low back pain 03/03/2013  . Muscle cramp 05/22/2014  . Obesity   . Obesity, unspecified 05/11/2013  . OSA (obstructive sleep apnea)   . Pain in joint, shoulder region 06/27/2015  .  Perimenopause 10/05/2012  . Raynaud disease 06/16/2013  . Thyroid disease    hypothyroidism  . Vocal fold nodules     PAST SURGICAL HISTORY: Past Surgical History:  Procedure Laterality Date  . ABDOMINAL HYSTERECTOMY    . CHOLECYSTECTOMY    . CYSTECTOMY    . DILATION AND CURETTAGE OF UTERUS     x2  . OOPHORECTOMY    . POLYPECTOMY    . ROTATOR CUFF REPAIR      SOCIAL HISTORY: Social History   Tobacco Use  . Smoking status: Former Smoker    Packs/day: 0.50    Years: 30.00    Pack years: 15.00    Types: Cigarettes    Last attempt to quit: 04/25/2003    Years since quitting: 14.2  . Smokeless tobacco: Never Used  . Tobacco comment: smoked since age 66  Substance Use Topics  . Alcohol use: No  . Drug use: Not on file    FAMILY HISTORY: Family History  Problem Relation Age of Onset  . Alcohol abuse Father   . Cancer Father        renal and colon  . Hyperlipidemia Father   . Hypertension Father   . Stroke Father   . Heart disease Father   . Cancer Paternal Grandfather        colon  . Diabetes Unknown   . High blood pressure  Mother   . High Cholesterol Mother   . Kidney disease Mother   . Depression Mother   . Anxiety disorder Mother   . Obesity Mother     ROS: Review of Systems  Constitutional: Positive for malaise/Gina. Negative for weight loss.       + Hot/cold intolerance  HENT: Positive for sinus pain and tinnitus.        + Nasal stuffiness + difficult or painful swallowing + Hoarseness  Eyes:       + Vision changes  Respiratory: Positive for cough and shortness of breath (with exertion).   Cardiovascular: Negative for palpitations.  Gastrointestinal: Positive for constipation, diarrhea and heartburn. Negative for nausea and vomiting.  Genitourinary: Negative for frequency.  Musculoskeletal: Positive for back pain.       Negative muscle weakness + Muscle or joint pain + Muscle stiffness  Skin:       + Dryness + Hair or nail changes  Neurological: Positive for weakness.  Endo/Heme/Allergies: Negative for polydipsia.       Positive polyphagia Negative hypoglycemia  Psychiatric/Behavioral: Positive for depression. Negative for suicidal ideas. The patient is nervous/anxious.     PHYSICAL EXAM: Blood pressure 125/79, pulse 68, temperature 98.1 F (36.7 C), temperature source Oral, height 5\' 4"  (1.626 m), weight 247 lb (112 kg), SpO2 96 %. Body mass index is 42.4 kg/m. Physical Exam  Constitutional: She is oriented to person, place, and time. She appears well-developed and well-nourished.  HENT:  Head: Normocephalic and atraumatic.  Nose: Nose normal.  Eyes: EOM are normal. No scleral icterus.  Neck: Normal range of motion. Neck supple. No thyromegaly present.  Cardiovascular: Normal rate and regular rhythm.  Pulmonary/Chest: Effort normal. No respiratory distress.  Abdominal: Soft. There is no tenderness.  + Obesity  Musculoskeletal:  Range of Motion normal in all 4 extremities Trace edema noted in bilateral lower extremities  Neurological: She is alert and oriented to person,  place, and time. Coordination normal.  Skin: Skin is warm and dry.  Psychiatric: She has a normal mood and affect. Her behavior is normal.  Vitals reviewed.   RECENT LABS AND TESTS: BMET    Component Value Date/Time   NA 139 02/22/2017 0951   K 3.8 02/22/2017 0951   CL 105 02/22/2017 0951   CO2 27 02/22/2017 0951   GLUCOSE 89 02/22/2017 0951   BUN 11 02/22/2017 0951   CREATININE 0.93 02/22/2017 0951   CREATININE 1.07 11/18/2013 0954   CALCIUM 10.2 02/22/2017 0951   GFRNONAA 79.33 04/06/2010 0851   GFRAA 86 06/09/2008 0854   Lab Results  Component Value Date   HGBA1C 6.3 05/23/2017   No results found for: INSULIN CBC    Component Value Date/Time   WBC 5.0 05/23/2017 0842   RBC 4.56 05/23/2017 0842   HGB 13.8 05/23/2017 0842   HCT 41.0 05/23/2017 0842   PLT 378.0 05/23/2017 0842   MCV 90.0 05/23/2017 0842   MCH 30.3 11/18/2013 0954   MCHC 33.5 05/23/2017 0842   RDW 14.2 05/23/2017 0842   LYMPHSABS 1.9 05/23/2017 0842   MONOABS 0.5 05/23/2017 0842   EOSABS 0.1 05/23/2017 0842   BASOSABS 0.0 05/23/2017 0842   Iron/TIBC/Ferritin/ %Sat No results found for: IRON, TIBC, FERRITIN, IRONPCTSAT Lipid Panel     Component Value Date/Time   CHOL 202 (H) 05/23/2017 0842   TRIG 115.0 05/23/2017 0842   HDL 47.20 05/23/2017 0842   CHOLHDL 4 05/23/2017 0842   VLDL 23.0 05/23/2017 0842   LDLCALC 132 (H) 05/23/2017 0842   LDLDIRECT 103.0 07/11/2016 0953   Hepatic Function Panel     Component Value Date/Time   PROT 7.2 02/22/2017 0951   ALBUMIN 4.3 02/22/2017 0951   AST 15 02/22/2017 0951   ALT 18 02/22/2017 0951   ALKPHOS 56 02/22/2017 0951   BILITOT 0.2 02/22/2017 0951   BILIDIR <0.1 11/18/2013 0954   IBILI NOT CALC 11/18/2013 0954      Component Value Date/Time   TSH 1.11 05/23/2017 0842   TSH 0.47 02/22/2017 0951   TSH 1.30 10/11/2016 0830    ECG  shows NSR with a rate of 73 BPM INDIRECT CALORIMETER done today shows a VO2 of 212 and a REE of 1478.  Her  calculated basal metabolic rate is 1914 thus her basal metabolic rate is worse than expected.    ASSESSMENT AND PLAN: Other Gina - Plan: EKG 12-Lead, Vitamin B12, CBC With Differential, Folate, Lipid Panel With LDL/HDL Ratio  Shortness of breath on exertion - Plan: CBC With Differential  Prediabetes - Plan: Comprehensive metabolic panel, Hemoglobin A1c, Insulin, random  Other specified hypothyroidism - Plan: T3, T4, free, TSH  Vitamin D deficiency - Plan: VITAMIN D 25 Hydroxy (Vit-D Deficiency, Fractures)  Depression screening  At risk for diabetes mellitus  Class 3 severe obesity with serious comorbidity and body mass index (BMI) of 40.0 to 44.9 in adult, unspecified obesity type (HCC)  PLAN:  Gina Bich was informed that her Gina may be related to obesity, depression or many other causes. Labs will be ordered, and in the meanwhile Margrete has agreed to work on diet, exercise and weight loss to help with Gina. Proper sleep hygiene was discussed including the need for 7-8 hours of quality sleep each night. A sleep study was not ordered based on symptoms and Epworth score.  Dyspnea on exertion [name]'s shortness of breath appears to be obesity related and exercise induced. She has agreed to work on weight loss and gradually increase exercise to treat her exercise induced shortness of breath. If [name] follows our instructions and loses weight without improvement  of her shortness of breath, we will plan to refer to pulmonology. We will monitor this condition regularly. Shakora agrees to this plan.  Pre-Diabetes Nara will continue to work on weight loss, diet, exercise, and decreasing simple carbohydrates in her diet to help decrease the risk of diabetes. We dicussed metformin including benefits and risks. She was informed that eating too many simple carbohydrates or too many calories at one sitting increases the likelihood of GI side effects. Zykira agrees to continue  taking metformin as prescribed. We will check labs and [name] agrees to follow up with our clinic in 2 to 3 as directed to monitor her progress.  Diabetes risk counselling [name] was given extended (15 minutes) diabetes prevention counseling today. She is 57 y.o. female and has risk factors for diabetes including obesity and pre-diabetes. We discussed intensive lifestyle modifications today with an emphasis on weight loss as well as increasing exercise and decreasing simple carbohydrates in her diet.  Hypothyroid [name] was informed of the importance of good thyroid control to help with weight loss efforts. She was also informed that supertheraputic thyroid levels are dangerous and will not improve weight loss results. [name] agrees to continue taking levothyroxine as prescribed. We will check labs and [name] agrees to follow up with our clinic in 2 to 3 weeks.  Vitamin D Deficiency [name] was informed that low vitamin D levels contributes to Gina and are associated with obesity, breast, and colon cancer. [name] agrees to continue taking prescription Vit D @50 ,000 IU every week #4 and will follow up for routine testing of vitamin D, at least 2-3 times per year. She was informed of the risk of over-replacement of vitamin D and agrees to not increase her dose unless she discusses this with [place] first. We will check labs and [name] agrees to follow up with our clinic in 2 to 3 weeks.  Depression Screen [name] had a negative depression screening. Depression is commonly associated with obesity and often results in emotional eating behaviors. We will monitor this closely and work on CBT to help improve the non-hunger eating patterns. Referral to Psychology may be required if no improvement is seen as she continues in our clinic.  Obesity Kamirah is currently in the action stage of change and her goal is to continue with weight loss efforts. I recommend Jalyne begin the structured treatment plan  as follows:  She has agreed to follow the Category 2 plan Breezy has been instructed to eventually work up to a goal of 150 minutes of combined cardio and strengthening exercise per week for weight loss and overall health benefits. We discussed the following Behavioral Modification Strategies today: increasing lean protein intake, decreasing simple carbohydrates , decrease eating out and dealing with family or coworker sabotage   She was informed of the importance of frequent follow up visits to maximize her success with intensive lifestyle modifications for her multiple health conditions. She was informed we would discuss her lab results at her next visit unless there is a critical issue that needs to be addressed sooner. Sullivan agreed to keep her next visit at the agreed upon time to discuss these results.    OBESITY BEHAVIORAL INTERVENTION VISIT  Today's visit was # 1 out of 22.  Starting weight: 247 lbs Starting date: 07/03/17 Today's weight : 247 lbs Today's date: 07/03/2017 Total lbs lost to date: 0 (Patients must lose 7 lbs in the first 6 months to continue with counseling)   ASK: We discussed the diagnosis of  obesity with [name] today and Elidia agreed to give [place] permission to discuss obesity behavioral modification therapy today.  ASSESS: Reneta has the diagnosis of obesity and her BMI today is 42.38 Chyann is in the action stage of change   ADVISE: Denia was educated on the multiple health risks of obesity as well as the benefit of weight loss to improve her health. She was advised of the need for long term treatment and the importance of lifestyle modifications.  AGREE: Multiple dietary modification options and treatment options were discussed and  Elim agreed to the above obesity treatment plan.   I, Trixie Dredge, am acting as transcriptionist for Dennard Nip, MD  I have reviewed the above documentation for accuracy and completeness, and I agree  with the above. -Dennard Nip, MD

## 2017-07-04 LAB — CBC WITH DIFFERENTIAL
Basophils Absolute: 0 10*3/uL (ref 0.0–0.2)
Basos: 1 %
EOS (ABSOLUTE): 0.1 10*3/uL (ref 0.0–0.4)
Eos: 3 %
Hematocrit: 40.6 % (ref 34.0–46.6)
Hemoglobin: 12.9 g/dL (ref 11.1–15.9)
Immature Grans (Abs): 0 10*3/uL (ref 0.0–0.1)
Immature Granulocytes: 0 %
Lymphocytes Absolute: 1.9 10*3/uL (ref 0.7–3.1)
Lymphs: 43 %
MCH: 29.3 pg (ref 26.6–33.0)
MCHC: 31.8 g/dL (ref 31.5–35.7)
MCV: 92 fL (ref 79–97)
Monocytes Absolute: 0.4 10*3/uL (ref 0.1–0.9)
Monocytes: 8 %
Neutrophils Absolute: 2 10*3/uL (ref 1.4–7.0)
Neutrophils: 45 %
RBC: 4.4 x10E6/uL (ref 3.77–5.28)
RDW: 14.4 % (ref 12.3–15.4)
WBC: 4.4 10*3/uL (ref 3.4–10.8)

## 2017-07-04 LAB — LIPID PANEL WITH LDL/HDL RATIO
Cholesterol, Total: 210 mg/dL — ABNORMAL HIGH (ref 100–199)
HDL: 47 mg/dL (ref >39)
LDL Calculated: 146 mg/dL — ABNORMAL HIGH (ref 0–99)
LDl/HDL Ratio: 3.1 ratio (ref 0.0–3.2)
Triglycerides: 87 mg/dL (ref 0–149)
VLDL Cholesterol Cal: 17 mg/dL (ref 5–40)

## 2017-07-04 LAB — COMPREHENSIVE METABOLIC PANEL
ALT: 19 IU/L (ref 0–32)
AST: 17 IU/L (ref 0–40)
Albumin/Globulin Ratio: 1.9 (ref 1.2–2.2)
Albumin: 4.4 g/dL (ref 3.5–5.5)
Alkaline Phosphatase: 62 IU/L (ref 39–117)
BUN/Creatinine Ratio: 9 (ref 9–23)
BUN: 8 mg/dL (ref 6–24)
Bilirubin Total: <0.2 mg/dL (ref 0.0–1.2)
CO2: 19 mmol/L — ABNORMAL LOW (ref 20–29)
Calcium: 9.7 mg/dL (ref 8.7–10.2)
Chloride: 105 mmol/L (ref 96–106)
Creatinine, Ser: 0.91 mg/dL (ref 0.57–1.00)
GFR calc Af Amer: 82 mL/min/{1.73_m2} (ref >59)
GFR calc non Af Amer: 71 mL/min/{1.73_m2} (ref >59)
Globulin, Total: 2.3 g/dL (ref 1.5–4.5)
Glucose: 84 mg/dL (ref 65–99)
Potassium: 4.4 mmol/L (ref 3.5–5.2)
Sodium: 139 mmol/L (ref 134–144)
Total Protein: 6.7 g/dL (ref 6.0–8.5)

## 2017-07-04 LAB — T3: T3, Total: 100 ng/dL (ref 71–180)

## 2017-07-04 LAB — INSULIN, RANDOM: INSULIN: 46.3 u[IU]/mL — ABNORMAL HIGH (ref 2.6–24.9)

## 2017-07-04 LAB — HEMOGLOBIN A1C
Est. average glucose Bld gHb Est-mCnc: 131 mg/dL
Hgb A1c MFr Bld: 6.2 % — ABNORMAL HIGH (ref 4.8–5.6)

## 2017-07-04 LAB — T4, FREE: Free T4: 1.51 ng/dL (ref 0.82–1.77)

## 2017-07-04 LAB — FOLATE: Folate: 5 ng/mL (ref >3.0)

## 2017-07-04 LAB — TSH: TSH: 0.788 u[IU]/mL (ref 0.450–4.500)

## 2017-07-04 LAB — VITAMIN B12: Vitamin B-12: 397 pg/mL (ref 232–1245)

## 2017-07-04 LAB — VITAMIN D 25 HYDROXY (VIT D DEFICIENCY, FRACTURES): Vit D, 25-Hydroxy: 40.5 ng/mL (ref 30.0–100.0)

## 2017-07-06 DIAGNOSIS — M9903 Segmental and somatic dysfunction of lumbar region: Secondary | ICD-10-CM | POA: Diagnosis not present

## 2017-07-06 DIAGNOSIS — M9902 Segmental and somatic dysfunction of thoracic region: Secondary | ICD-10-CM | POA: Diagnosis not present

## 2017-07-06 DIAGNOSIS — M5136 Other intervertebral disc degeneration, lumbar region: Secondary | ICD-10-CM | POA: Diagnosis not present

## 2017-07-06 DIAGNOSIS — M9905 Segmental and somatic dysfunction of pelvic region: Secondary | ICD-10-CM | POA: Diagnosis not present

## 2017-07-09 ENCOUNTER — Other Ambulatory Visit: Payer: Self-pay | Admitting: Family Medicine

## 2017-07-09 MED FILL — FUROSEMIDE 20 MG TABS: 20 | 20 days supply | Qty: 40 | Fill #0

## 2017-07-09 MED FILL — VIT D2 1.25 MG (50,000 UNIT: 1.25 MG | 84 days supply | Qty: 12 | Fill #1

## 2017-07-12 ENCOUNTER — Other Ambulatory Visit: Payer: Self-pay | Admitting: Family Medicine

## 2017-07-12 ENCOUNTER — Ambulatory Visit: Payer: 59 | Admitting: Podiatry

## 2017-07-12 MED FILL — MODAFINIL 200 MG TABLET: 200 | 30 days supply | Qty: 45 | Fill #1

## 2017-07-12 MED FILL — FENOFIBRATE 134 MG CAPSULE: 134 | 30 days supply | Qty: 30 | Fill #0

## 2017-07-14 ENCOUNTER — Telehealth: Payer: 59 | Admitting: Physician Assistant

## 2017-07-14 DIAGNOSIS — J208 Acute bronchitis due to other specified organisms: Secondary | ICD-10-CM

## 2017-07-14 MED ORDER — PREDNISONE 10 MG PO TABS: 10.0000 mg | ORAL_TABLET | Freq: Every day | ORAL | 0 refills | Status: DC

## 2017-07-14 MED ORDER — BENZONATATE 100 MG PO CAPS: 100.0000 mg | ORAL_CAPSULE | Freq: Three times a day (TID) | ORAL | 0 refills | Status: DC | PRN

## 2017-07-14 NOTE — Addendum Note (Signed)
Addended by: Brunetta Jeans on: 07/14/2017 11:14 AM   Modules accepted: Orders

## 2017-07-14 NOTE — Progress Notes (Signed)
We are sorry that you are not feeling well.  Here is how we plan to help!  Based on your presentation I believe you most likely have A cough due to a virus.  This is called viral bronchitis and is best treated by rest, plenty of fluids and control of the cough.  You may use Ibuprofen or Tylenol as directed to help your symptoms.     In addition you may use A prescription cough medication called Tessalon Perles 100mg . You may take 1-2 capsules every 8 hours as needed for your cough.  Make sure to rest your voice, put a humidifier in the bedroom and keep well-hydrated to help with the hoarseness!  From your responses in the eVisit questionnaire you describe inflammation in the upper respiratory tract which is causing a significant cough.  This is commonly called Bronchitis and has four common causes:    Allergies  Viral Infections  Acid Reflux  Bacterial Infection Allergies, viruses and acid reflux are treated by controlling symptoms or eliminating the cause. An example might be a cough caused by taking certain blood pressure medications. You stop the cough by changing the medication. Another example might be a cough caused by acid reflux. Controlling the reflux helps control the cough.  USE OF BRONCHODILATOR ("RESCUE") INHALERS: There is a risk from using your bronchodilator too frequently.  The risk is that over-reliance on a medication which only relaxes the muscles surrounding the breathing tubes can reduce the effectiveness of medications prescribed to reduce swelling and congestion of the tubes themselves.  Although you feel brief relief from the bronchodilator inhaler, your asthma may actually be worsening with the tubes becoming more swollen and filled with mucus.  This can delay other crucial treatments, such as oral steroid medications. If you need to use a bronchodilator inhaler daily, several times per day, you should discuss this with your provider.  There are probably better  treatments that could be used to keep your asthma under control.     HOME CARE . Only take medications as instructed by your medical team. . Complete the entire course of an antibiotic. . Drink plenty of fluids and get plenty of rest. . Avoid close contacts especially the very young and the elderly . Cover your mouth if you cough or cough into your sleeve. . Always remember to wash your hands . A steam or ultrasonic humidifier can help congestion.   GET HELP RIGHT AWAY IF: . You develop worsening fever. . You become short of breath . You cough up blood. . Your symptoms persist after you have completed your treatment plan MAKE SURE YOU   Understand these instructions.  Will watch your condition.  Will get help right away if you are not doing well or get worse.  Your e-visit answers were reviewed by a board certified advanced clinical practitioner to complete your personal care plan.  Depending on the condition, your plan could have included both over the counter or prescription medications. If there is a problem please reply  once you have received a response from your provider. Your safety is important to [place].  If you have drug allergies check your prescription carefully.    You can use MyChart to ask questions about today's visit, request a non-urgent call back, or ask for a work or school excuse for 24 hours related to this e-Visit. If it has been greater than 24 hours you will need to follow up with your provider, or enter a new e-Visit  to address those concerns. You will get an e-mail in the next two days asking about your experience.  I hope that your e-visit has been valuable and will speed your recovery. Thank you for using e-visits.

## 2017-07-23 ENCOUNTER — Ambulatory Visit (INDEPENDENT_AMBULATORY_CARE_PROVIDER_SITE_OTHER): Payer: 59 | Admitting: Family Medicine

## 2017-07-23 ENCOUNTER — Encounter: Payer: Self-pay | Admitting: Podiatry

## 2017-07-23 ENCOUNTER — Ambulatory Visit: Payer: 59 | Admitting: Podiatry

## 2017-07-23 VITALS — BP 134/71 | HR 73 | Temp 97.7°F | Ht 64.0 in | Wt 241.0 lb

## 2017-07-23 VITALS — BP 137/81 | HR 73

## 2017-07-23 DIAGNOSIS — Z6841 Body Mass Index (BMI) 40.0 and over, adult: Secondary | ICD-10-CM

## 2017-07-23 DIAGNOSIS — E559 Vitamin D deficiency, unspecified: Secondary | ICD-10-CM

## 2017-07-23 DIAGNOSIS — E66813 Obesity, class 3: Secondary | ICD-10-CM

## 2017-07-23 DIAGNOSIS — L600 Ingrowing nail: Secondary | ICD-10-CM

## 2017-07-23 DIAGNOSIS — Z9189 Other specified personal risk factors, not elsewhere classified: Secondary | ICD-10-CM

## 2017-07-23 DIAGNOSIS — R7303 Prediabetes: Secondary | ICD-10-CM

## 2017-07-23 MED ORDER — METFORMIN HCL 500 MG PO TABS: 500.0000 mg | ORAL_TABLET | Freq: Every day | ORAL | 0 refills | Status: DC

## 2017-07-23 MED FILL — metFORMIN HCL 500 MG TABS: 500 | 30 days supply | Qty: 30 | Fill #0

## 2017-07-23 NOTE — Progress Notes (Signed)
Office: [phone]  /  Fax: [phone]   HPI:   Chief Complaint: OBESITY [name] is here to discuss her progress with her obesity treatment plan. She is on the Category 2 plan and is following her eating plan approximately 30 % of the time. She states she is exercising 0 minutes 0 times per week. Blimi did well with weight loss but struggled to follow her plan closely. She is not good at meal planning and prepping yet and thinks she has become a pickier eater than she realized.  Her weight is 241 lb (109.3 kg) today and has had a weight loss of 6 pounds over a period of 3 weeks since her last visit. She has lost 6 lbs since starting treatment with [place].  Pre-Diabetes [name] has a diagnosis of pre-diabetes based on her elevated Hgb A1c and was informed this puts her at greater risk of developing diabetes. A1c elevated at 6.2, she is not taking metformin regularly. She notes acanthosis nigricans. She is eating a higher simple carbohydrate diet. [name] continues to work on diet and exercise to decrease risk of diabetes. She denies nausea or hypoglycemia.  At risk for diabetes [name] is at higher than average risk for developing diabetes due to her obesity and pre-diabetes. She currently denies polyuria or polydipsia.  Vitamin D Deficiency [name] has a diagnosis of vitamin D deficiency. She is on Vit D prescription, level not yet at goal. She denies nausea, vomiting or muscle weakness.  ALLERGIES: Allergies  Allergen Reactions  . Losartan Cough  . Aspirin Other (See Comments)    GI upset REACTION: Upset stomach  . Atorvastatin     myalgias  . Codeine   . Amoxicillin Rash  . Erythromycin Rash  . Penicillins Rash    MEDICATIONS: Current Outpatient Medications on File Prior to Visit  Medication Sig Dispense Refill  . acetaminophen (TYLENOL) 325 MG tablet Take 650 mg by mouth every 6 (six) hours as needed.    Marland Kitchen albuterol (VENTOLIN HFA) 108 (90 Base) MCG/ACT inhaler INHALE 2  PUFFS INTO THE LUNGS EVERY 4 HOURS AS NEEDED FOR WHEEZING OR SHORTNESS OF BREATH. 18 g 6  . benzonatate (TESSALON) 100 MG capsule Take 1 capsule (100 mg total) by mouth 3 (three) times daily as needed for cough. 20 capsule 0  . BYSTOLIC 20 MG TABS TAKE 1 TABLET BY MOUTH ONCE DAILY 90 tablet 2  . Cod Liver Oil 1000 MG CAPS Take 1 capsule by mouth daily.    . diphenhydrAMINE (BENADRYL) 25 MG tablet Take 25 mg by mouth every 6 (six) hours as needed.    . fenofibrate micronized (LOFIBRA) 134 MG capsule TAKE 1 CAPSULE BY MOUTH DAILY BEFORE BREAKFAST. 30 capsule 0  . furosemide (LASIX) 20 MG tablet TAKE 1 TO 2 TABLETS BY MOUTH DAILY AS NEEDED FOR FLUID OR EDEMA 40 tablet 1  . gabapentin (NEURONTIN) 300 MG capsule Take 1 capsule (300 mg total) by mouth at bedtime. 30 capsule 3  . loratadine (CLARITIN) 10 MG tablet Take 10 mg by mouth daily.    . modafinil (PROVIGIL) 200 MG tablet TAKE 1 & 1/2 TABLET BY MOUTH DAILY 45 tablet 2  . predniSONE (DELTASONE) 10 MG tablet Take 1 tablet (10 mg total) by mouth daily with breakfast. 3 tablet 0  . spironolactone (ALDACTONE) 50 MG tablet Take 1 tablet (50 mg total) by mouth daily. 30 tablet 5  . SYNTHROID 100 MCG tablet Take 1 tablet (100 mcg total) by mouth daily before breakfast.  90 tablet 1  . Vitamin D, Ergocalciferol, (DRISDOL) 50000 units CAPS capsule TAKE 1 CAPSULE BY MOUTH EVERY 7 DAYS 12 capsule 1   No current facility-administered medications on file prior to visit.     PAST MEDICAL HISTORY: Past Medical History:  Diagnosis Date  . Acute bronchitis 05/25/2016  . Anxiety   . Chronic pain syndrome 01/06/2013  . Chronic rhinosinusitis   . Colon polyps   . Cough 01/06/2013  . Depression   . Diabetes ([place]) 01/06/2013  . Diabetes mellitus type 2 in obese [facility]) 01/06/2013   Sees Dr Calvert Cantor for eye exam Does not see podiatry, foot exam today unremarkable except for thick cracking skin on heals   . Dysuria 05/25/2016  . Edema 01/06/2013  . Epistaxis  05/25/2016  . Fibroid, uterine   . GERD (gastroesophageal reflux disease)   . Glaucoma 12-13  . Hoarseness   . Hoarseness of voice 05/11/2013  . Hyperglycemia 04/13/2013  . Hyperlipemia, mixed 06/06/2007   Qualifier: Diagnosis of  By: Wynona Luna She feels Lipitor caused increased low back pain and weakness    . Hyperlipidemia   . Hypertension   . Hypokalemia 02/05/2013  . IBS (irritable bowel syndrome) 02/13/2011  . Low back pain 03/03/2013  . Muscle cramp 05/22/2014  . Obesity   . Obesity, unspecified 05/11/2013  . OSA (obstructive sleep apnea)   . Pain in joint, shoulder region 06/27/2015  . Perimenopause 10/05/2012  . Raynaud disease 06/16/2013  . Thyroid disease    hypothyroidism  . Vocal fold nodules     PAST SURGICAL HISTORY: Past Surgical History:  Procedure Laterality Date  . ABDOMINAL HYSTERECTOMY    . CHOLECYSTECTOMY    . CYSTECTOMY    . DILATION AND CURETTAGE OF UTERUS     x2  . OOPHORECTOMY    . POLYPECTOMY    . ROTATOR CUFF REPAIR      SOCIAL HISTORY: Social History   Tobacco Use  . Smoking status: Former Smoker    Packs/day: 0.50    Years: 30.00    Pack years: 15.00    Types: Cigarettes    Last attempt to quit: 04/25/2003    Years since quitting: 14.2  . Smokeless tobacco: Never Used  . Tobacco comment: smoked since age 71  Substance Use Topics  . Alcohol use: No  . Drug use: Not on file    FAMILY HISTORY: Family History  Problem Relation Age of Onset  . Alcohol abuse Father   . Cancer Father        renal and colon  . Hyperlipidemia Father   . Hypertension Father   . Stroke Father   . Heart disease Father   . Cancer Paternal Grandfather        colon  . Diabetes Unknown   . High blood pressure Mother   . High Cholesterol Mother   . Kidney disease Mother   . Depression Mother   . Anxiety disorder Mother   . Obesity Mother     ROS: Review of Systems  Constitutional: Positive for weight loss.  Gastrointestinal: Negative for nausea  and vomiting.  Genitourinary: Negative for frequency.  Musculoskeletal:       Negative muscle weakness  Skin:       + Acanthosis nigricans  Endo/Heme/Allergies: Negative for polydipsia.       Negative hypoglycemia    PHYSICAL EXAM: Blood pressure 134/71, pulse 73, temperature 97.7 F (36.5 C), temperature source Oral, height 5\' 4"  (1.626  m), weight 241 lb (109.3 kg), SpO2 97 %. Body mass index is 41.37 kg/m. Physical Exam  Constitutional: She is oriented to person, place, and time. She appears well-developed and well-nourished.  Cardiovascular: Normal rate.  Pulmonary/Chest: Effort normal.  Musculoskeletal: Normal range of motion.  Neurological: She is oriented to person, place, and time.  Skin: Skin is warm and dry.  Psychiatric: She has a normal mood and affect. Her behavior is normal.  Vitals reviewed.   RECENT LABS AND TESTS: BMET    Component Value Date/Time   NA 139 07/03/2017 1002   K 4.4 07/03/2017 1002   CL 105 07/03/2017 1002   CO2 19 (L) 07/03/2017 1002   GLUCOSE 84 07/03/2017 1002   GLUCOSE 89 02/22/2017 0951   BUN 8 07/03/2017 1002   CREATININE 0.91 07/03/2017 1002   CREATININE 1.07 11/18/2013 0954   CALCIUM 9.7 07/03/2017 1002   GFRNONAA 71 07/03/2017 1002   GFRAA 82 07/03/2017 1002   Lab Results  Component Value Date   HGBA1C 6.2 (H) 07/03/2017   HGBA1C 6.3 05/23/2017   HGBA1C 6.0 02/22/2017   HGBA1C 6.3 10/11/2016   HGBA1C 6.2 07/11/2016   Lab Results  Component Value Date   INSULIN 46.3 (H) 07/03/2017   CBC    Component Value Date/Time   WBC 4.4 07/03/2017 1002   WBC 5.0 05/23/2017 0842   RBC 4.40 07/03/2017 1002   RBC 4.56 05/23/2017 0842   HGB 12.9 07/03/2017 1002   HCT 40.6 07/03/2017 1002   PLT 378.0 05/23/2017 0842   MCV 92 07/03/2017 1002   MCH 29.3 07/03/2017 1002   MCH 30.3 11/18/2013 0954   MCHC 31.8 07/03/2017 1002   MCHC 33.5 05/23/2017 0842   RDW 14.4 07/03/2017 1002   LYMPHSABS 1.9 07/03/2017 1002   MONOABS 0.5  05/23/2017 0842   EOSABS 0.1 07/03/2017 1002   BASOSABS 0.0 07/03/2017 1002   Iron/TIBC/Ferritin/ %Sat No results found for: IRON, TIBC, FERRITIN, IRONPCTSAT Lipid Panel     Component Value Date/Time   CHOL 210 (H) 07/03/2017 1002   TRIG 87 07/03/2017 1002   HDL 47 07/03/2017 1002   CHOLHDL 4 05/23/2017 0842   VLDL 23.0 05/23/2017 0842   LDLCALC 146 (H) 07/03/2017 1002   LDLDIRECT 103.0 07/11/2016 0953   Hepatic Function Panel     Component Value Date/Time   PROT 6.7 07/03/2017 1002   ALBUMIN 4.4 07/03/2017 1002   AST 17 07/03/2017 1002   ALT 19 07/03/2017 1002   ALKPHOS 62 07/03/2017 1002   BILITOT <0.2 07/03/2017 1002   BILIDIR <0.1 11/18/2013 0954   IBILI NOT CALC 11/18/2013 0954      Component Value Date/Time   TSH 0.788 07/03/2017 1002   TSH 1.11 05/23/2017 0842   TSH 0.47 02/22/2017 0951  Results for KINSHASA, THROCKMORTON (MRN [id number]) as of 07/23/2017 17:04  Ref. Range 07/03/2017 10:02  Vitamin D, 25-Hydroxy Latest Ref Range: 30.0 - 100.0 ng/mL 40.5    ASSESSMENT AND PLAN: Prediabetes - Plan: metFORMIN (GLUCOPHAGE) 500 MG tablet  Vitamin D deficiency  At risk for diabetes mellitus  Class 3 severe obesity with serious comorbidity and body mass index (BMI) of 40.0 to 44.9 in adult, unspecified obesity type ([place])  PLAN:  Pre-Diabetes [name] will continue to work on weight loss, exercise, and decreasing simple carbohydrates in her diet to help decrease the risk of diabetes. We dicussed metformin including benefits and risks. She was informed that eating too many simple carbohydrates or too many calories  at one sitting increases the likelihood of GI side effects. [name] agrees to restart metformin 500 mg q AM #30 with no refills. [name] agrees to follow up with our clinic in 2 weeks as directed to monitor her progress.  Diabetes risk counselling [name] was given extended (15 minutes) diabetes prevention counseling today. She is 57 y.o. female and has risk  factors for diabetes including obesity and pre-diabetes. We discussed intensive lifestyle modifications today with an emphasis on weight loss as well as increasing exercise and decreasing simple carbohydrates in her diet.  Vitamin D Deficiency [name] was informed that low vitamin D levels contributes to fatigue and are associated with obesity, breast, and colon cancer. [name] agrees to continue taking prescription Vit D @50 ,000 IU every week for 3 months and we will recheck labs at that time. She will follow up for routine testing of vitamin D, at least 2-3 times per year. She was informed of the risk of over-replacement of vitamin D and agrees to not increase her dose unless she discusses this with [place] first. [name] agrees to follow up with our clinic in 2 weeks.  Obesity [name] is currently in the action stage of change. As such, her goal is to continue with weight loss efforts She has agreed to keep a food journal with 300-450 calories and 35+ grams of protein at lunch daily and follow the Category 2 plan [name] has been instructed to work up to a goal of 150 minutes of combined cardio and strengthening exercise per week for weight loss and overall health benefits. We discussed the following Behavioral Modification Strategies today: increasing lean protein intake and decreasing simple carbohydrates    [name] has agreed to follow up with our clinic in 2 weeks. She was informed of the importance of frequent follow up visits to maximize her success with intensive lifestyle modifications for her multiple health conditions.   OBESITY BEHAVIORAL INTERVENTION VISIT  Today's visit was # 2 out of 22.  Starting weight: 247 lbs Starting date: 07/03/17 Today's weight : 241 lbs Today's date: 07/23/2017 Total lbs lost to date: 6 (Patients must lose 7 lbs in the first 6 months to continue with counseling)   ASK: We discussed the diagnosis of obesity with [name] today and [name] agreed  to give [place] permission to discuss obesity behavioral modification therapy today.  ASSESS: [name] has the diagnosis of obesity and her BMI today is 41.35 [name] is in the action stage of change   ADVISE: [name] was educated on the multiple health risks of obesity as well as the benefit of weight loss to improve her health. She was advised of the need for long term treatment and the importance of lifestyle modifications.  AGREE: Multiple dietary modification options and treatment options were discussed and  [name] agreed to the above obesity treatment plan.  Howard, Trixie Dredge, am acting as transcriptionist for Dennard Nip, MD  Howard have reviewed the above documentation for accuracy and completeness, and Howard agree with the above. -Dennard Nip, MD

## 2017-07-23 NOTE — Patient Instructions (Signed)

## 2017-07-24 ENCOUNTER — Encounter (INDEPENDENT_AMBULATORY_CARE_PROVIDER_SITE_OTHER): Payer: Self-pay | Admitting: Family Medicine

## 2017-07-25 NOTE — Progress Notes (Signed)
Subjective: 57 year old patient with PMHx of T2DM presents today for evaluation of pain to the lateral border of the left great toe that has been present for the past two months. Patient is concerned for possible ingrown nail. Applying pressure to the area increases the pain. She has not done anything for treatment. Patient presents today for further treatment and evaluation.  Past Medical History:  Diagnosis Date  . Acute bronchitis 05/25/2016  . Anxiety   . Chronic pain syndrome 01/06/2013  . Chronic rhinosinusitis   . Colon polyps   . Cough 01/06/2013  . Depression   . Diabetes ([place]) 01/06/2013  . Diabetes mellitus type 2 in obese [facility]) 01/06/2013   Sees Dr Calvert Cantor for eye exam Does not see podiatry, foot exam today unremarkable except for thick cracking skin on heals   . Dysuria 05/25/2016  . Edema 01/06/2013  . Epistaxis 05/25/2016  . Fibroid, uterine   . GERD (gastroesophageal reflux disease)   . Glaucoma 12-13  . Hoarseness   . Hoarseness of voice 05/11/2013  . Hyperglycemia 04/13/2013  . Hyperlipemia, mixed 06/06/2007   Qualifier: Diagnosis of  By: Wynona Luna She feels Lipitor caused increased low back pain and weakness    . Hyperlipidemia   . Hypertension   . Hypokalemia 02/05/2013  . IBS (irritable bowel syndrome) 02/13/2011  . Low back pain 03/03/2013  . Muscle cramp 05/22/2014  . Obesity   . Obesity, unspecified 05/11/2013  . OSA (obstructive sleep apnea)   . Pain in joint, shoulder region 06/27/2015  . Perimenopause 10/05/2012  . Raynaud disease 06/16/2013  . Thyroid disease    hypothyroidism  . Vocal fold nodules     Objective:  General: Well developed, nourished, in no acute distress, alert and oriented x3   Dermatology: Skin is warm, dry and supple bilateral. Lateral border of the left great toe appears to be erythematous with evidence of an ingrowing nail. Pain on palpation noted to the border of the nail fold. The remaining nails appear unremarkable at  this time. There are no open sores, lesions.  Vascular: Dorsalis Pedis artery and Posterior Tibial artery pedal pulses palpable. No lower extremity edema noted.   Neruologic: Grossly intact via light touch bilateral.  Musculoskeletal: Muscular strength within normal limits in all groups bilateral. Normal range of motion noted to all pedal and ankle joints.   Assesement: #1 Paronychia with ingrowing nail lateral border left great toe #2 Pain in toe #3 Incurvated nail  Plan of Care:  1. Patient evaluated.  2. Discussed treatment alternatives and plan of care. Explained nail avulsion procedure and post procedure course to patient. 3. Patient opted for permanent partial nail avulsion.  4. Prior to procedure, local anesthesia infiltration utilized using 3 ml of a 50:50 mixture of 2% plain lidocaine and 0.5% plain marcaine in a normal hallux block fashion and a betadine prep performed.  5. Partial permanent nail avulsion with chemical matrixectomy performed using [id number] applications of phenol followed by alcohol flush.  6. Light dressing applied. 7. Return to clinic in 2 weeks.   Edrick Kins, DPM [facility]  Dr. Edrick Kins, [place]                                        [place],  [address]  Office [phone]  Fax [phone]

## 2017-08-03 MED FILL — FUROSEMIDE 20 MG TABS: 20 | 20 days supply | Qty: 40 | Fill #1

## 2017-08-06 ENCOUNTER — Ambulatory Visit: Payer: 59 | Admitting: Podiatry

## 2017-08-06 ENCOUNTER — Encounter: Payer: Self-pay | Admitting: Podiatry

## 2017-08-06 ENCOUNTER — Encounter: Payer: Self-pay | Admitting: Pulmonary Disease

## 2017-08-06 ENCOUNTER — Ambulatory Visit: Payer: 59 | Admitting: Pulmonary Disease

## 2017-08-06 ENCOUNTER — Ambulatory Visit (INDEPENDENT_AMBULATORY_CARE_PROVIDER_SITE_OTHER): Payer: 59 | Admitting: Family Medicine

## 2017-08-06 VITALS — BP 130/76 | HR 74 | Temp 98.3°F | Ht 64.0 in | Wt 239.0 lb

## 2017-08-06 VITALS — BP 130/84 | HR 77 | Ht 64.5 in | Wt 243.8 lb

## 2017-08-06 DIAGNOSIS — G4733 Obstructive sleep apnea (adult) (pediatric): Secondary | ICD-10-CM

## 2017-08-06 DIAGNOSIS — Z6841 Body Mass Index (BMI) 40.0 and over, adult: Secondary | ICD-10-CM

## 2017-08-06 DIAGNOSIS — G4730 Sleep apnea, unspecified: Secondary | ICD-10-CM | POA: Diagnosis not present

## 2017-08-06 DIAGNOSIS — G4710 Hypersomnia, unspecified: Secondary | ICD-10-CM

## 2017-08-06 DIAGNOSIS — Z9189 Other specified personal risk factors, not elsewhere classified: Secondary | ICD-10-CM | POA: Diagnosis not present

## 2017-08-06 DIAGNOSIS — F3289 Other specified depressive episodes: Secondary | ICD-10-CM | POA: Diagnosis not present

## 2017-08-06 DIAGNOSIS — R7303 Prediabetes: Secondary | ICD-10-CM | POA: Diagnosis not present

## 2017-08-06 DIAGNOSIS — L600 Ingrowing nail: Secondary | ICD-10-CM

## 2017-08-06 DIAGNOSIS — E66813 Obesity, class 3: Secondary | ICD-10-CM

## 2017-08-06 MED ORDER — BUPROPION HCL ER (SR) 150 MG PO TB12: 150.0000 mg | ORAL_TABLET | Freq: Every day | ORAL | 0 refills | Status: DC

## 2017-08-06 MED ORDER — MODAFINIL 200 MG PO TABS: 400.0000 mg | ORAL_TABLET | Freq: Every day | ORAL | 5 refills | Status: DC

## 2017-08-06 MED ORDER — METFORMIN HCL 1000 MG PO TABS: 1000.0000 mg | ORAL_TABLET | Freq: Every day | ORAL | 0 refills | Status: DC

## 2017-08-06 MED FILL — BUPROPION SR 150 MG TABLET: 150 | 30 days supply | Qty: 30 | Fill #0

## 2017-08-06 MED FILL — metFORMIN HCL 1000 MG TABS: 1000 | 30 days supply | Qty: 30 | Fill #0

## 2017-08-06 NOTE — Progress Notes (Signed)
[facility]  Chief Complaint  Patient presents with  . Follow-up    Pt doing well with CPAP machine    Vital signs: BP 130/84 (BP Location: Left Arm, Cuff Size: Normal)   Pulse 77   Ht 5' 4.5" (1.638 m)   Wt 243 lb 12.8 oz (110.6 kg)   SpO2 96%   BMI 41.20 kg/m   History of Present Illness: [name] is a 57 y.o. female with obstructive sleep apnea.  She was previously seen by Dr. Gwenette Greet and Dr. Corrie Dandy.    She uses CPAP nightly.  Got new CPAP last year.  Uses nasal pillows.  Still gets sleepy during the day if not using provigil.  She takes 300 mg daily.  Can't take dose too late >> causes trouble falling asleep.  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  Feels she needs higher dose in morning.  No headache, tremor, GI symptoms, or palpitations.   Physical Exam:  General - pleasant Eyes - pupils reactive ENT - no sinus tenderness, no oral exudate, no LAN, enlarged tongue, MP 4 Cardiac - regular, no murmur Chest - no wheeze, rales Abd - soft, non tender Ext - no edema Skin - no rashes Neuro - normal strength Psych - normal mood   Assessment/Plan:  Obstructive sleep apnea. - she is compliant with CPAP and reports benefit from therapy - continue auto CPAP  Hypersomnia with obstructive sleep apnea. 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 - she was restarted on gabapentin - need to monitor daytime sleepiness as her dose increases   Patient Instructions  Provigil 400 mg daily  Follow up in 1 year    Chesley Mires, MD [place] 08/06/2017, 9:26 AM  Flow Sheet  Pulmonary tests: PFT 02/25/16 >> FEV1 2.36 (107%), FEV1% 88, TLC 4.02 (79%), DLCO 76%  Sleep tests: PSG 08/12/06 >> AHI 5, SpO2 85% HST 2013 >> AHI 12 Auto CPAP 06/1317 to 08/02/17 >> used on 30 of 30 nights with average 8 hrs 16 hrs.  Average AHI 0.9 with median CPAP  10 and 95 th percentile CPAP 14 cm H2O  Cardiac tests: Echo 01/21/16 >> mild LVH, EF 55 to 60%, grade 1 DD  Past Medical History: She  has a past medical history of Acute bronchitis (05/25/2016), Anxiety, Chronic pain syndrome (01/06/2013), Chronic rhinosinusitis, Colon polyps, Cough (01/06/2013), Depression, Diabetes ([place]) (01/06/2013), Diabetes mellitus type 2 in obese ([place]) (01/06/2013), Dysuria (05/25/2016), Edema (01/06/2013), Epistaxis (05/25/2016), Fibroid, uterine, GERD (gastroesophageal reflux disease), Glaucoma (12-13), Hoarseness, Hoarseness of voice (05/11/2013), Hyperglycemia (04/13/2013), Hyperlipemia, mixed (06/06/2007), Hyperlipidemia, Hypertension, Hypokalemia (02/05/2013), IBS (irritable bowel syndrome) (02/13/2011), Low back pain (03/03/2013), Muscle cramp (05/22/2014), Obesity, Obesity, unspecified (05/11/2013), OSA (obstructive sleep apnea), Pain in joint, shoulder region (06/27/2015), Perimenopause (10/05/2012), Raynaud disease (06/16/2013), Thyroid disease, and Vocal fold nodules.  Past Surgical History: She  has a past surgical history that includes Cholecystectomy; Abdominal hysterectomy; Cystectomy; Dilation and curettage of uterus; Polypectomy; Oophorectomy; and Rotator cuff repair.  Family History: Her family history includes Alcohol abuse in her father; Anxiety disorder in her mother; Cancer in her father and paternal grandfather; Depression in her mother; Diabetes in her unknown relative; Heart disease in her father; High Cholesterol in her mother; High blood pressure in her mother; Hyperlipidemia in her father; Hypertension in her father; Kidney disease in her mother; Obesity in her mother; Stroke in her  father.  Social History: She  reports that she quit smoking about 14 years ago. Her smoking use included cigarettes. She has a 15.00 pack-year smoking history. She has never used smokeless tobacco. She reports that she does not drink alcohol or use drugs.  Medications: Allergies as of  08/06/2017      Reactions   Losartan Cough   Aspirin Other (See Comments)   GI upset REACTION: Upset stomach   Atorvastatin    myalgias   Codeine    Amoxicillin Rash   Erythromycin Rash   Penicillins Rash      Medication List        Accurate as of 08/06/17  9:26 AM. Always use your most recent med list.          acetaminophen 325 MG tablet Commonly known as:  TYLENOL Take 650 mg by mouth every 6 (six) hours as needed.   albuterol 108 (90 Base) MCG/ACT inhaler Commonly known as:  VENTOLIN HFA INHALE 2 PUFFS INTO THE LUNGS EVERY 4 HOURS AS NEEDED FOR WHEEZING OR SHORTNESS OF BREATH.   benzonatate 100 MG capsule Commonly known as:  TESSALON Take 1 capsule (100 mg total) by mouth 3 (three) times daily as needed for cough.   BYSTOLIC 20 MG Tabs Generic drug:  Nebivolol HCl TAKE 1 TABLET BY MOUTH ONCE DAILY   Cod Liver Oil 1000 MG Caps Take 1 capsule by mouth daily.   diphenhydrAMINE 25 MG tablet Commonly known as:  BENADRYL Take 25 mg by mouth every 6 (six) hours as needed.   fenofibrate micronized 134 MG capsule Commonly known as:  LOFIBRA TAKE 1 CAPSULE BY MOUTH DAILY BEFORE BREAKFAST.   furosemide 20 MG tablet Commonly known as:  LASIX TAKE 1 TO 2 TABLETS BY MOUTH DAILY AS NEEDED FOR FLUID OR EDEMA   gabapentin 300 MG capsule Commonly known as:  NEURONTIN Take 1 capsule (300 mg total) by mouth at bedtime.   loratadine 10 MG tablet Commonly known as:  CLARITIN Take 10 mg by mouth daily.   metFORMIN 500 MG tablet Commonly known as:  GLUCOPHAGE Take 1 tablet (500 mg total) by mouth daily with breakfast.   modafinil 200 MG tablet Commonly known as:  PROVIGIL Take 2 tablets (400 mg total) by mouth daily.   spironolactone 50 MG tablet Commonly known as:  ALDACTONE Take 1 tablet (50 mg total) by mouth daily.   SYNTHROID 100 MCG tablet Generic drug:  levothyroxine Take 1 tablet (100 mcg total) by mouth daily before breakfast.   Vitamin D  (Ergocalciferol) 50000 units Caps capsule Commonly known as:  DRISDOL TAKE 1 CAPSULE BY MOUTH EVERY 7 DAYS

## 2017-08-06 NOTE — Patient Instructions (Signed)
Provigil 400 mg daily  Follow up in 1 year

## 2017-08-07 NOTE — Progress Notes (Signed)
Office: [phone]  /  Fax: [phone]   HPI:   Chief Complaint: OBESITY [name] is here to discuss her progress with her obesity treatment plan. She is on the  keep a food journal with 300 to 450 calories and 35+ grams of protein at lunch daily and follow the Category 2 plan and is following her eating plan approximately 50 % of the time. She states she is exercising 0 minutes 0 times per week. [name] continues to do well with weight loss. Hunger is better controlled, but she is struggling with some emotional eating. She is working on [employer] and planning. Her weight is 239 lb (108.4 kg) today and has had a weight loss of 3 pounds over a period of 2 weeks since her last visit. She has lost 8 lbs since starting treatment with [place].  Pre-Diabetes [name] has a diagnosis of pre-diabetes based on her elevated Hgb A1c and was informed this puts her at greater risk of developing diabetes. She restarted metformin with no GI upset. She still notes some polyphagia and would like to increase the dose back to 1,000.mg. She is doing well on her diet prescription. [name] continues to work on diet and exercise to decrease risk of diabetes. She denies hypoglycemia.  At risk for diabetes [name] is at higher than average risk for developing diabetes due to her obesity and pre-diabetes. She currently denies polyuria or polydipsia.  Depression with emotional eating behaviors [name] notes increased emotional eating, especially around PMS and is worse in the evening. Her mood is stable otherwise. [name] struggles with emotional eating and using food for comfort to the extent that it is negatively impacting her health. She often snacks when she is not hungry. [name] sometimes feels she is out of control and then feels guilty that she made poor food choices. She has been working on behavior modification techniques to help reduce her emotional eating and has been somewhat successful. She shows no sign of  suicidal or homicidal ideations.  Depression screen [facility] 2/9 07/03/2017  Decreased Interest 0  Down, Depressed, Hopeless 0  PHQ - 2 Score 0  Altered sleeping 1  Tired, decreased energy 2  Change in appetite 0  Feeling bad or failure about yourself  0  Trouble concentrating 1  Moving slowly or fidgety/restless 0  Suicidal thoughts 0  PHQ-9 Score 4  Difficult doing work/chores Not difficult at all  Some recent data might be hidden    ALLERGIES: Allergies  Allergen Reactions  . Losartan Cough  . Aspirin Other (See Comments)    GI upset REACTION: Upset stomach  . Atorvastatin     myalgias  . Codeine   . Amoxicillin Rash  . Erythromycin Rash  . Penicillins Rash    MEDICATIONS: Current Outpatient Medications on File Prior to Visit  Medication Sig Dispense Refill  . acetaminophen (TYLENOL) 325 MG tablet Take 650 mg by mouth every 6 (six) hours as needed.    Marland Kitchen albuterol (VENTOLIN HFA) 108 (90 Base) MCG/ACT inhaler INHALE 2 PUFFS INTO THE LUNGS EVERY 4 HOURS AS NEEDED FOR WHEEZING OR SHORTNESS OF BREATH. 18 g 6  . benzonatate (TESSALON) 100 MG capsule Take 1 capsule (100 mg total) by mouth 3 (three) times daily as needed for cough. 20 capsule 0  . BYSTOLIC 20 MG TABS TAKE 1 TABLET BY MOUTH ONCE DAILY 90 tablet 2  . Cod Liver Oil 1000 MG CAPS Take 1 capsule by mouth daily.    . diphenhydrAMINE (BENADRYL) 25  MG tablet Take 25 mg by mouth every 6 (six) hours as needed.    . fenofibrate micronized (LOFIBRA) 134 MG capsule TAKE 1 CAPSULE BY MOUTH DAILY BEFORE BREAKFAST. 30 capsule 0  . furosemide (LASIX) 20 MG tablet TAKE 1 TO 2 TABLETS BY MOUTH DAILY AS NEEDED FOR FLUID OR EDEMA 40 tablet 1  . gabapentin (NEURONTIN) 300 MG capsule Take 1 capsule (300 mg total) by mouth at bedtime. 30 capsule 3  . loratadine (CLARITIN) 10 MG tablet Take 10 mg by mouth daily.    . modafinil (PROVIGIL) 200 MG tablet Take 2 tablets (400 mg total) by mouth daily. 60 tablet 5  . spironolactone (ALDACTONE)  50 MG tablet Take 1 tablet (50 mg total) by mouth daily. 30 tablet 5  . SYNTHROID 100 MCG tablet Take 1 tablet (100 mcg total) by mouth daily before breakfast. 90 tablet 1  . Vitamin D, Ergocalciferol, (DRISDOL) 50000 units CAPS capsule TAKE 1 CAPSULE BY MOUTH EVERY 7 DAYS 12 capsule 1   No current facility-administered medications on file prior to visit.     PAST MEDICAL HISTORY: Past Medical History:  Diagnosis Date  . Acute bronchitis 05/25/2016  . Anxiety   . Chronic pain syndrome 01/06/2013  . Chronic rhinosinusitis   . Colon polyps   . Cough 01/06/2013  . Depression   . Diabetes ([place]) 01/06/2013  . Diabetes mellitus type 2 in obese [facility]) 01/06/2013   Sees Dr Calvert Cantor for eye exam Does not see podiatry, foot exam today unremarkable except for thick cracking skin on heals   . Dysuria 05/25/2016  . Edema 01/06/2013  . Epistaxis 05/25/2016  . Fibroid, uterine   . GERD (gastroesophageal reflux disease)   . Glaucoma 12-13  . Hoarseness   . Hoarseness of voice 05/11/2013  . Hyperglycemia 04/13/2013  . Hyperlipemia, mixed 06/06/2007   Qualifier: Diagnosis of  By: Wynona Luna She feels Lipitor caused increased low back pain and weakness    . Hyperlipidemia   . Hypertension   . Hypokalemia 02/05/2013  . IBS (irritable bowel syndrome) 02/13/2011  . Low back pain 03/03/2013  . Muscle cramp 05/22/2014  . Obesity   . Obesity, unspecified 05/11/2013  . OSA (obstructive sleep apnea)   . Pain in joint, shoulder region 06/27/2015  . Perimenopause 10/05/2012  . Raynaud disease 06/16/2013  . Thyroid disease    hypothyroidism  . Vocal fold nodules     PAST SURGICAL HISTORY: Past Surgical History:  Procedure Laterality Date  . ABDOMINAL HYSTERECTOMY    . CHOLECYSTECTOMY    . CYSTECTOMY    . DILATION AND CURETTAGE OF UTERUS     x2  . OOPHORECTOMY    . POLYPECTOMY    . ROTATOR CUFF REPAIR      SOCIAL HISTORY: Social History   Tobacco Use  . Smoking status: Former Smoker     Packs/day: 0.50    Years: 30.00    Pack years: 15.00    Types: Cigarettes    Last attempt to quit: 04/25/2003    Years since quitting: 14.2  . Smokeless tobacco: Never Used  . Tobacco comment: smoked since age 63  Substance Use Topics  . Alcohol use: No  . Drug use: Never    FAMILY HISTORY: Family History  Problem Relation Age of Onset  . Alcohol abuse Father   . Cancer Father        renal and colon  . Hyperlipidemia Father   . Hypertension Father   .  Stroke Father   . Heart disease Father   . Cancer Paternal Grandfather        colon  . Diabetes Unknown   . High blood pressure Mother   . High Cholesterol Mother   . Kidney disease Mother   . Depression Mother   . Anxiety disorder Mother   . Obesity Mother     ROS: Review of Systems  Constitutional: Positive for weight loss.  Gastrointestinal: Negative for diarrhea, nausea and vomiting.  Genitourinary: Negative for frequency.  Endo/Heme/Allergies: Negative for polydipsia.       Positive for polyphagia Negative for hypoglycemia  Psychiatric/Behavioral: Positive for depression. Negative for suicidal ideas.    PHYSICAL EXAM: Blood pressure 130/76, pulse 74, temperature 98.3 F (36.8 C), temperature source Oral, height 5\' 4"  (1.626 m), weight 239 lb (108.4 kg), SpO2 97 %. Body mass index is 41.02 kg/m. Physical Exam  Constitutional: She is oriented to person, place, and time. She appears well-developed and well-nourished.  Cardiovascular: Normal rate.  Pulmonary/Chest: Effort normal.  Musculoskeletal: Normal range of motion.  Neurological: She is oriented to person, place, and time.  Skin: Skin is warm and dry.  Psychiatric: She has a normal mood and affect. Her behavior is normal.  Vitals reviewed.   RECENT LABS AND TESTS: BMET    Component Value Date/Time   NA 139 07/03/2017 1002   K 4.4 07/03/2017 1002   CL 105 07/03/2017 1002   CO2 19 (L) 07/03/2017 1002   GLUCOSE 84 07/03/2017 1002   GLUCOSE 89  02/22/2017 0951   BUN 8 07/03/2017 1002   CREATININE 0.91 07/03/2017 1002   CREATININE 1.07 11/18/2013 0954   CALCIUM 9.7 07/03/2017 1002   GFRNONAA 71 07/03/2017 1002   GFRAA 82 07/03/2017 1002   Lab Results  Component Value Date   HGBA1C 6.2 (H) 07/03/2017   HGBA1C 6.3 05/23/2017   HGBA1C 6.0 02/22/2017   HGBA1C 6.3 10/11/2016   HGBA1C 6.2 07/11/2016   Lab Results  Component Value Date   INSULIN 46.3 (H) 07/03/2017   CBC    Component Value Date/Time   WBC 4.4 07/03/2017 1002   WBC 5.0 05/23/2017 0842   RBC 4.40 07/03/2017 1002   RBC 4.56 05/23/2017 0842   HGB 12.9 07/03/2017 1002   HCT 40.6 07/03/2017 1002   PLT 378.0 05/23/2017 0842   MCV 92 07/03/2017 1002   MCH 29.3 07/03/2017 1002   MCH 30.3 11/18/2013 0954   MCHC 31.8 07/03/2017 1002   MCHC 33.5 05/23/2017 0842   RDW 14.4 07/03/2017 1002   LYMPHSABS 1.9 07/03/2017 1002   MONOABS 0.5 05/23/2017 0842   EOSABS 0.1 07/03/2017 1002   BASOSABS 0.0 07/03/2017 1002   Iron/TIBC/Ferritin/ %Sat No results found for: IRON, TIBC, FERRITIN, IRONPCTSAT Lipid Panel     Component Value Date/Time   CHOL 210 (H) 07/03/2017 1002   TRIG 87 07/03/2017 1002   HDL 47 07/03/2017 1002   CHOLHDL 4 05/23/2017 0842   VLDL 23.0 05/23/2017 0842   LDLCALC 146 (H) 07/03/2017 1002   LDLDIRECT 103.0 07/11/2016 0953   Hepatic Function Panel     Component Value Date/Time   PROT 6.7 07/03/2017 1002   ALBUMIN 4.4 07/03/2017 1002   AST 17 07/03/2017 1002   ALT 19 07/03/2017 1002   ALKPHOS 62 07/03/2017 1002   BILITOT <0.2 07/03/2017 1002   BILIDIR <0.1 11/18/2013 0954   IBILI NOT CALC 11/18/2013 0954      Component Value Date/Time   TSH 0.788 07/03/2017  1002   TSH 1.11 05/23/2017 0842   TSH 0.47 02/22/2017 0951   Results for ISRAA, CABAN (MRN [id number]) as of 08/07/2017 07:10  Ref. Range 07/03/2017 10:02  Vitamin D, 25-Hydroxy Latest Ref Range: 30.0 - 100.0 ng/mL 40.5   ASSESSMENT AND PLAN: Other depression - with  emotional eating - Plan: buPROPion (WELLBUTRIN SR) 150 MG 12 hr tablet  Prediabetes - Plan: metFORMIN (GLUCOPHAGE) 1000 MG tablet  At risk for diabetes mellitus  Class 3 severe obesity with serious comorbidity and body mass index (BMI) of 40.0 to 44.9 in adult, unspecified obesity type ([place])  PLAN:  Pre-Diabetes [name] will continue to work on weight loss, exercise, and decreasing simple carbohydrates in her diet to help decrease the risk of diabetes. We dicussed metformin including benefits and risks. She was informed that eating too many simple carbohydrates or too many calories at one sitting increases the likelihood of GI side effects. [name] agrees to increase metformin to 1,000 mg qAM #30 with no refills. [name] agreed to follow up with [place] as directed to monitor her progress.  Diabetes risk counseling [name] was given extended (15 minutes) diabetes prevention counseling today. She is 57 y.o. female and has risk factors for diabetes including obesity and pre-diabetes. We discussed intensive lifestyle modifications today with an emphasis on weight loss as well as increasing exercise and decreasing simple carbohydrates in her diet.  Depression with Emotional Eating Behaviors We discussed behavior modification techniques today to help [name] deal with her emotional eating and depression. She has agreed to start Wellbutrin SR 150 mg qAM #30 with no refills and follow up as directed.  Obesity [name] is currently in the action stage of change. As such, her goal is to continue with weight loss efforts She has agreed to keep a food journal with 300 to 450 calories and 35+ grams of protein at lunch daily and follow the Category 2 plan [name] has been instructed to work up to a goal of 150 minutes of combined cardio and strengthening exercise per week for weight loss and overall health benefits. We discussed the following Behavioral Modification Strategies today: increasing lean protein  intake, work on meal planning and easy cooking plans, dealing with family or coworker sabotage and emotional eating strategies  [name] has agreed to follow up with our clinic in 2 to 3 weeks. She was informed of the importance of frequent follow up visits to maximize her success with intensive lifestyle modifications for her multiple health conditions.   OBESITY BEHAVIORAL INTERVENTION VISIT  Today's visit was # 3 out of 22.  Starting weight: 247 lbs Starting date: 07/04/15 Today's weight : 239 lbs Today's date: 08/06/2017 Total lbs lost to date: 8 (Patients must lose 7 lbs in the first 6 months to continue with counseling)   ASK: We discussed the diagnosis of obesity with [name] today and Corena agreed to give [place] permission to discuss obesity behavioral modification therapy today.  ASSESS: Onita has the diagnosis of obesity and her BMI today is 18 [name] is in the action stage of change   ADVISE: [name] was educated on the multiple health risks of obesity as well as the benefit of weight loss to improve her health. She was advised of the need for long term treatment and the importance of lifestyle modifications.  AGREE: Multiple dietary modification options and treatment options were discussed and  [name] agreed to the above obesity treatment plan.  Corey Skains, am acting as Location manager for [employer]  Leafy Ro, MD  I have reviewed the above documentation for accuracy and completeness, and I agree with the above. -Dennard Nip, MD

## 2017-08-09 ENCOUNTER — Other Ambulatory Visit: Payer: Self-pay | Admitting: Family Medicine

## 2017-08-09 MED FILL — SPIRONOLACTONE 25 MG TABLET: 25 | 30 days supply | Qty: 60 | Fill #0

## 2017-08-10 NOTE — Progress Notes (Signed)
   Subjective: Patient presents today 2 weeks post ingrown nail permanent nail avulsion procedure of the lateral border of the left great toe. She reports some continued tenderness of the area. She reports some associated bloody drainage from the toe. Patient is here for further evaluation and treatment.   Past Medical History:  Diagnosis Date  . Acute bronchitis 05/25/2016  . Anxiety   . Chronic pain syndrome 01/06/2013  . Chronic rhinosinusitis   . Colon polyps   . Cough 01/06/2013  . Depression   . Diabetes ([place]) 01/06/2013  . Diabetes mellitus type 2 in obese [facility]) 01/06/2013   Sees Dr Calvert Cantor for eye exam Does not see podiatry, foot exam today unremarkable except for thick cracking skin on heals   . Dysuria 05/25/2016  . Edema 01/06/2013  . Epistaxis 05/25/2016  . Fibroid, uterine   . GERD (gastroesophageal reflux disease)   . Glaucoma 12-13  . Hoarseness   . Hoarseness of voice 05/11/2013  . Hyperglycemia 04/13/2013  . Hyperlipemia, mixed 06/06/2007   Qualifier: Diagnosis of  By: Wynona Luna She feels Lipitor caused increased low back pain and weakness    . Hyperlipidemia   . Hypertension   . Hypokalemia 02/05/2013  . IBS (irritable bowel syndrome) 02/13/2011  . Low back pain 03/03/2013  . Muscle cramp 05/22/2014  . Obesity   . Obesity, unspecified 05/11/2013  . OSA (obstructive sleep apnea)   . Pain in joint, shoulder region 06/27/2015  . Perimenopause 10/05/2012  . Raynaud disease 06/16/2013  . Thyroid disease    hypothyroidism  . Vocal fold nodules     Objective: Skin is warm, dry and supple. Nail and respective nail fold appears to be healing appropriately. Open wound to the associated nail fold with a granular wound base and moderate amount of fibrotic tissue. Minimal drainage noted. Mild erythema around the periungual region likely due to phenol chemical matricectomy.  Assessment: #1 postop permanent partial nail avulsion lateral border left great toe #2 open  wound periungual nail fold of respective digit.   Plan of care: #1 patient was evaluated  #2 debridement of open wound was performed to the periungual border of the respective toe using a currette. Antibiotic ointment and Band-Aid was applied. #3 patient is to return to clinic on a PRN basis.   Edrick Kins, DPM [facility]  Dr. Edrick Kins, [address]                                        [place], [place] [address]                Office [phone]  Fax [phone]

## 2017-08-13 DIAGNOSIS — M9905 Segmental and somatic dysfunction of pelvic region: Secondary | ICD-10-CM | POA: Diagnosis not present

## 2017-08-13 DIAGNOSIS — M9902 Segmental and somatic dysfunction of thoracic region: Secondary | ICD-10-CM | POA: Diagnosis not present

## 2017-08-13 DIAGNOSIS — M5136 Other intervertebral disc degeneration, lumbar region: Secondary | ICD-10-CM | POA: Diagnosis not present

## 2017-08-13 DIAGNOSIS — M9903 Segmental and somatic dysfunction of lumbar region: Secondary | ICD-10-CM | POA: Diagnosis not present

## 2017-08-14 ENCOUNTER — Ambulatory Visit
Admission: RE | Admit: 2017-08-14 | Discharge: 2017-08-14 | Disposition: A | Discharge status: Home / Self Care | Payer: 59 | Source: Ambulatory Visit | Attending: Family Medicine | Admitting: Family Medicine

## 2017-08-14 DIAGNOSIS — Z1231 Encounter for screening mammogram for malignant neoplasm of breast: Secondary | ICD-10-CM | POA: Diagnosis not present

## 2017-08-14 DIAGNOSIS — Z1239 Encounter for other screening for malignant neoplasm of breast: Secondary | ICD-10-CM

## 2017-08-20 ENCOUNTER — Other Ambulatory Visit: Payer: Self-pay | Admitting: Family Medicine

## 2017-08-20 MED ORDER — FENOFIBRATE MICRONIZED 134 MG PO CAPS: 134.0000 mg | ORAL_CAPSULE | Freq: Every day | ORAL | 3 refills | Status: DC

## 2017-08-20 MED FILL — BYSTOLIC 20 MG TABLET: 20 | 90 days supply | Qty: 90 | Fill #2

## 2017-08-20 MED FILL — FENOFIBRATE 134 MG CAPSULE: 134 | 30 days supply | Qty: 30 | Fill #0

## 2017-08-20 MED FILL — MODAFINIL 200 MG TABLET: 200 | 30 days supply | Qty: 45 | Fill #2

## 2017-08-23 ENCOUNTER — Other Ambulatory Visit: Payer: Self-pay | Admitting: Family Medicine

## 2017-08-27 ENCOUNTER — Ambulatory Visit (INDEPENDENT_AMBULATORY_CARE_PROVIDER_SITE_OTHER): Payer: 59 | Admitting: Family Medicine

## 2017-08-27 ENCOUNTER — Other Ambulatory Visit: Payer: Self-pay

## 2017-08-27 ENCOUNTER — Encounter: Payer: Self-pay | Admitting: Family Medicine

## 2017-08-27 MED ORDER — FUROSEMIDE 20 MG PO TABS: ORAL_TABLET | ORAL | 1 refills | Status: DC

## 2017-08-27 MED FILL — GABAPENTIN 300 MG CAPSULE: 300 | 30 days supply | Qty: 30 | Fill #0

## 2017-08-27 MED FILL — FUROSEMIDE 20 MG TABS: 20 | 20 days supply | Qty: 40 | Fill #0

## 2017-08-31 ENCOUNTER — Encounter (INDEPENDENT_AMBULATORY_CARE_PROVIDER_SITE_OTHER): Payer: Self-pay | Admitting: Family Medicine

## 2017-09-03 ENCOUNTER — Encounter (INDEPENDENT_AMBULATORY_CARE_PROVIDER_SITE_OTHER): Payer: Self-pay | Admitting: Family Medicine

## 2017-09-03 ENCOUNTER — Other Ambulatory Visit (INDEPENDENT_AMBULATORY_CARE_PROVIDER_SITE_OTHER): Payer: Self-pay | Admitting: Family Medicine

## 2017-09-03 ENCOUNTER — Ambulatory Visit (INDEPENDENT_AMBULATORY_CARE_PROVIDER_SITE_OTHER): Payer: 59 | Admitting: Family Medicine

## 2017-09-03 DIAGNOSIS — F3289 Other specified depressive episodes: Secondary | ICD-10-CM

## 2017-09-03 MED FILL — SYNTHROID 100 MCG TABLET: 100 | 90 days supply | Qty: 90 | Fill #1

## 2017-09-03 NOTE — Telephone Encounter (Signed)
Pt called this morning would like to be seen after  noon today.

## 2017-09-04 ENCOUNTER — Other Ambulatory Visit (INDEPENDENT_AMBULATORY_CARE_PROVIDER_SITE_OTHER): Payer: Self-pay | Admitting: Family Medicine

## 2017-09-04 DIAGNOSIS — F3289 Other specified depressive episodes: Secondary | ICD-10-CM

## 2017-09-04 MED ORDER — BUPROPION HCL ER (SR) 150 MG PO TB12: 150.0000 mg | ORAL_TABLET | Freq: Every day | ORAL | 0 refills | Status: DC

## 2017-09-04 MED FILL — BUPROPION SR 150 MG TABLET: 150 | 30 days supply | Qty: 30 | Fill #0

## 2017-09-06 ENCOUNTER — Ambulatory Visit (INDEPENDENT_AMBULATORY_CARE_PROVIDER_SITE_OTHER): Payer: 59 | Admitting: Family Medicine

## 2017-09-06 VITALS — BP 121/75 | HR 58 | Temp 97.8°F | Ht 64.0 in | Wt 237.0 lb

## 2017-09-06 DIAGNOSIS — E559 Vitamin D deficiency, unspecified: Secondary | ICD-10-CM

## 2017-09-06 DIAGNOSIS — Z9189 Other specified personal risk factors, not elsewhere classified: Secondary | ICD-10-CM

## 2017-09-06 DIAGNOSIS — Z6841 Body Mass Index (BMI) 40.0 and over, adult: Secondary | ICD-10-CM

## 2017-09-06 DIAGNOSIS — E66813 Obesity, class 3: Secondary | ICD-10-CM

## 2017-09-06 MED ORDER — VITAMIN D (ERGOCALCIFEROL) 1.25 MG (50000 UNIT) PO CAPS: ORAL_CAPSULE | ORAL | 1 refills | Status: DC

## 2017-09-06 NOTE — Progress Notes (Signed)
Office: [phone]  /  Fax: [phone]   HPI:   Chief Complaint: OBESITY [name] is here to discuss her progress with her obesity treatment plan. She is on the  keep a food journal with 300 to 450 calories and 35+ grams of protein at lunch daily and follow the Category 2 plan and is following her eating plan approximately 40 % of the time. She states she is doing 2,000 steps daily. [name] continues to do well with weight loss. [name] was allowed to journal for lunch, but she tried to journal everything and struggled to do this. Her protein is unlikely to be at goal. Her weight is 237 lb (107.5 kg) today and has had a weight loss of 2 pounds over a period of 4 weeks since her last visit. She has lost 10 lbs since starting treatment with [place].  Vitamin D deficiency [name] has a diagnosis of vitamin D deficiency. Clark is stable on vit D but she is not yet at goal. She denies nausea, vomiting or muscle weakness.  At risk for osteopenia and osteoporosis [name] is at higher risk of osteopenia and osteoporosis due to vitamin D deficiency.   ALLERGIES: Allergies  Allergen Reactions  . Losartan Cough  . Aspirin Other (See Comments)    GI upset REACTION: Upset stomach  . Atorvastatin     myalgias  . Codeine   . Amoxicillin Rash  . Erythromycin Rash  . Penicillins Rash    MEDICATIONS: Current Outpatient Medications on File Prior to Visit  Medication Sig Dispense Refill  . acetaminophen (TYLENOL) 325 MG tablet Take 650 mg by mouth every 6 (six) hours as needed.    Marland Kitchen albuterol (VENTOLIN HFA) 108 (90 Base) MCG/ACT inhaler INHALE 2 PUFFS INTO THE LUNGS EVERY 4 HOURS AS NEEDED FOR WHEEZING OR SHORTNESS OF BREATH. 18 g 6  . benzonatate (TESSALON) 100 MG capsule Take 1 capsule (100 mg total) by mouth 3 (three) times daily as needed for cough. 20 capsule 0  . buPROPion (WELLBUTRIN SR) 150 MG 12 hr tablet Take 1 tablet (150 mg total) by mouth daily. 30 tablet 0  . BYSTOLIC 20 MG TABS  TAKE 1 TABLET BY MOUTH ONCE DAILY 90 tablet 2  . Cod Liver Oil 1000 MG CAPS Take 1 capsule by mouth daily.    . diphenhydrAMINE (BENADRYL) 25 MG tablet Take 25 mg by mouth every 6 (six) hours as needed.    . fenofibrate micronized (LOFIBRA) 134 MG capsule TAKE 1 CAPSULE BY MOUTH DAILY BEFORE BREAKFAST. 30 capsule 0  . fenofibrate micronized (LOFIBRA) 134 MG capsule Take 1 capsule (134 mg total) by mouth daily before breakfast. 30 capsule 3  . furosemide (LASIX) 20 MG tablet TAKE 1 TO 2 TABLETS BY MOUTH DAILY AS NEEDED FOR FLUID OR EDEMA 40 tablet 1  . gabapentin (NEURONTIN) 300 MG capsule Take 1 capsule (300 mg total) by mouth at bedtime. 30 capsule 3  . loratadine (CLARITIN) 10 MG tablet Take 10 mg by mouth daily.    . metFORMIN (GLUCOPHAGE) 1000 MG tablet Take 1 tablet (1,000 mg total) by mouth daily with breakfast. 30 tablet 0  . modafinil (PROVIGIL) 200 MG tablet Take 2 tablets (400 mg total) by mouth daily. 60 tablet 5  . spironolactone (ALDACTONE) 25 MG tablet TAKE 2 TABLETS BY MOUTH DAILY 60 tablet 4  . spironolactone (ALDACTONE) 50 MG tablet Take 1 tablet (50 mg total) by mouth daily. 30 tablet 5  . SYNTHROID 100 MCG tablet Take 1  tablet (100 mcg total) by mouth daily before breakfast. 90 tablet 1   No current facility-administered medications on file prior to visit.     PAST MEDICAL HISTORY: Past Medical History:  Diagnosis Date  . Acute bronchitis 05/25/2016  . Anxiety   . Chronic pain syndrome 01/06/2013  . Chronic rhinosinusitis   . Colon polyps   . Cough 01/06/2013  . Depression   . Diabetes ([place]) 01/06/2013  . Diabetes mellitus type 2 in obese [facility]) 01/06/2013   Sees Dr Calvert Cantor for eye exam Does not see podiatry, foot exam today unremarkable except for thick cracking skin on heals   . Dysuria 05/25/2016  . Edema 01/06/2013  . Epistaxis 05/25/2016  . Fibroid, uterine   . GERD (gastroesophageal reflux disease)   . Glaucoma 12-13  . Hoarseness   . Hoarseness of voice  05/11/2013  . Hyperglycemia 04/13/2013  . Hyperlipemia, mixed 06/06/2007   Qualifier: Diagnosis of  By: Wynona Luna She feels Lipitor caused increased low back pain and weakness    . Hyperlipidemia   . Hypertension   . Hypokalemia 02/05/2013  . IBS (irritable bowel syndrome) 02/13/2011  . Low back pain 03/03/2013  . Muscle cramp 05/22/2014  . Obesity   . Obesity, unspecified 05/11/2013  . OSA (obstructive sleep apnea)   . Pain in joint, shoulder region 06/27/2015  . Perimenopause 10/05/2012  . Raynaud disease 06/16/2013  . Thyroid disease    hypothyroidism  . Vocal fold nodules     PAST SURGICAL HISTORY: Past Surgical History:  Procedure Laterality Date  . ABDOMINAL HYSTERECTOMY    . BREAST EXCISIONAL BIOPSY Right    at age 27  . CHOLECYSTECTOMY    . CYSTECTOMY    . DILATION AND CURETTAGE OF UTERUS     x2  . OOPHORECTOMY    . POLYPECTOMY    . ROTATOR CUFF REPAIR      SOCIAL HISTORY: Social History   Tobacco Use  . Smoking status: Former Smoker    Packs/day: 0.50    Years: 30.00    Pack years: 15.00    Types: Cigarettes    Last attempt to quit: 04/25/2003    Years since quitting: 14.3  . Smokeless tobacco: Never Used  . Tobacco comment: smoked since age 28  Substance Use Topics  . Alcohol use: No  . Drug use: Never    FAMILY HISTORY: Family History  Problem Relation Age of Onset  . Alcohol abuse Father   . Cancer Father        renal and colon  . Hyperlipidemia Father   . Hypertension Father   . Stroke Father   . Heart disease Father   . Cancer Paternal Grandfather        colon  . Diabetes Unknown   . High blood pressure Mother   . High Cholesterol Mother   . Kidney disease Mother   . Depression Mother   . Anxiety disorder Mother   . Obesity Mother   . Breast cancer Other 45    ROS: Review of Systems  Constitutional: Positive for weight loss.  Gastrointestinal: Negative for nausea and vomiting.  Musculoskeletal:       Negative for muscle  weakness    PHYSICAL EXAM: Blood pressure 121/75, pulse (!) 58, temperature 97.8 F (36.6 C), temperature source Oral, height 5\' 4"  (1.626 m), weight 237 lb (107.5 kg), SpO2 97 %. Body mass index is 40.68 kg/m. Physical Exam  Constitutional: She is oriented to  person, place, and time. She appears well-developed and well-nourished.  Cardiovascular: Normal rate.  Pulmonary/Chest: Effort normal.  Musculoskeletal: Normal range of motion.  Neurological: She is oriented to person, place, and time.  Skin: Skin is warm and dry.  Psychiatric: She has a normal mood and affect. Her behavior is normal.  Vitals reviewed.   RECENT LABS AND TESTS: BMET    Component Value Date/Time   NA 139 07/03/2017 1002   K 4.4 07/03/2017 1002   CL 105 07/03/2017 1002   CO2 19 (L) 07/03/2017 1002   GLUCOSE 84 07/03/2017 1002   GLUCOSE 89 02/22/2017 0951   BUN 8 07/03/2017 1002   CREATININE 0.91 07/03/2017 1002   CREATININE 1.07 11/18/2013 0954   CALCIUM 9.7 07/03/2017 1002   GFRNONAA 71 07/03/2017 1002   GFRAA 82 07/03/2017 1002   Lab Results  Component Value Date   HGBA1C 6.2 (H) 07/03/2017   HGBA1C 6.3 05/23/2017   HGBA1C 6.0 02/22/2017   HGBA1C 6.3 10/11/2016   HGBA1C 6.2 07/11/2016   Lab Results  Component Value Date   INSULIN 46.3 (H) 07/03/2017   CBC    Component Value Date/Time   WBC 4.4 07/03/2017 1002   WBC 5.0 05/23/2017 0842   RBC 4.40 07/03/2017 1002   RBC 4.56 05/23/2017 0842   HGB 12.9 07/03/2017 1002   HCT 40.6 07/03/2017 1002   PLT 378.0 05/23/2017 0842   MCV 92 07/03/2017 1002   MCH 29.3 07/03/2017 1002   MCH 30.3 11/18/2013 0954   MCHC 31.8 07/03/2017 1002   MCHC 33.5 05/23/2017 0842   RDW 14.4 07/03/2017 1002   LYMPHSABS 1.9 07/03/2017 1002   MONOABS 0.5 05/23/2017 0842   EOSABS 0.1 07/03/2017 1002   BASOSABS 0.0 07/03/2017 1002   Iron/TIBC/Ferritin/ %Sat No results found for: IRON, TIBC, FERRITIN, IRONPCTSAT Lipid Panel     Component Value Date/Time    CHOL 210 (H) 07/03/2017 1002   TRIG 87 07/03/2017 1002   HDL 47 07/03/2017 1002   CHOLHDL 4 05/23/2017 0842   VLDL 23.0 05/23/2017 0842   LDLCALC 146 (H) 07/03/2017 1002   LDLDIRECT 103.0 07/11/2016 0953   Hepatic Function Panel     Component Value Date/Time   PROT 6.7 07/03/2017 1002   ALBUMIN 4.4 07/03/2017 1002   AST 17 07/03/2017 1002   ALT 19 07/03/2017 1002   ALKPHOS 62 07/03/2017 1002   BILITOT <0.2 07/03/2017 1002   BILIDIR <0.1 11/18/2013 0954   IBILI NOT CALC 11/18/2013 0954      Component Value Date/Time   TSH 0.788 07/03/2017 1002   TSH 1.11 05/23/2017 0842   TSH 0.47 02/22/2017 0951   Results for AVANA, KREISER (MRN [id number]) as of 09/06/2017 13:49  Ref. Range 07/03/2017 10:02  Vitamin D, 25-Hydroxy Latest Ref Range: 30.0 - 100.0 ng/mL 40.5   ASSESSMENT AND PLAN: Vitamin D deficiency - Plan: Vitamin D, Ergocalciferol, (DRISDOL) 50000 units CAPS capsule  At risk for osteoporosis  Class 3 severe obesity with serious comorbidity and body mass index (BMI) of 40.0 to 44.9 in adult, unspecified obesity type ([place])  PLAN:  Vitamin D Deficiency Denym was informed that low vitamin D levels contributes to fatigue and are associated with obesity, breast, and colon cancer. She agrees to continue to take prescription Vit D @50 ,000 IU every week #4 with no refills and will follow up for routine testing of vitamin D, at least 2-3 times per year. She was informed of the risk of over-replacement of vitamin D  and agrees to not increase her dose unless she discusses this with [place] first. Samie agrees to follow up as directed.  At risk for osteopenia and osteoporosis Abrianna is at risk for osteopenia and osteoporosis due to her vitamin D deficiency. She was encouraged to take her vitamin D and follow her higher calcium diet and increase strengthening exercise to help strengthen her bones and decrease her risk of osteopenia and osteoporosis.  Obesity Lacee is currently in  the action stage of change. As such, her goal is to continue with weight loss efforts She has agreed to keep a food journal with 300 to 450 calories and 35 grams of protein at lunch daily and follow the Category 2 plan Juliane has been instructed to work up to a goal of 150 minutes of combined cardio and strengthening exercise per week for weight loss and overall health benefits. We discussed the following Behavioral Modification Strategies today: increasing lean protein intake, decreasing simple carbohydrates , decrease eating out, work on meal planning and easy cooking plans and dealing with family or coworker sabotage  Persis has agreed to follow up with our clinic in 2 to 3 weeks. She was informed of the importance of frequent follow up visits to maximize her success with intensive lifestyle modifications for her multiple health conditions.   OBESITY BEHAVIORAL INTERVENTION VISIT  Today's visit was # 4 out of 22.  Starting weight: 247 lbs Starting date: 07/03/17 Today's weight : 237 lbs Today's date: 09/06/2017 Total lbs lost to date: 10 (Patients must lose 7 lbs in the first 6 months to continue with counseling)   ASK: We discussed the diagnosis of obesity with [name] today and Mishika agreed to give [place] permission to discuss obesity behavioral modification therapy today.  ASSESS: Adamae has the diagnosis of obesity and her BMI today is 40.66 Denica is in the action stage of change   ADVISE: Zuly was educated on the multiple health risks of obesity as well as the benefit of weight loss to improve her health. She was advised of the need for long term treatment and the importance of lifestyle modifications.  AGREE: Multiple dietary modification options and treatment options were discussed and  Karlee agreed to the above obesity treatment plan.  I, Doreene Nest, am acting as transcriptionist for Dennard Nip, MD  I have reviewed the above documentation for  accuracy and completeness, and I agree with the above. -Dennard Nip, MD

## 2017-09-13 MED FILL — SPIRONOLACTONE 25 MG TABLET: 25 | 30 days supply | Qty: 60 | Fill #1

## 2017-09-19 MED FILL — FUROSEMIDE 20 MG TABS: 20 | 20 days supply | Qty: 40 | Fill #1

## 2017-09-19 MED FILL — FENOFIBRATE 134 MG CAPSULE: 134 | 30 days supply | Qty: 30 | Fill #1

## 2017-09-27 ENCOUNTER — Ambulatory Visit (INDEPENDENT_AMBULATORY_CARE_PROVIDER_SITE_OTHER): Payer: 59 | Admitting: Family Medicine

## 2017-09-27 VITALS — BP 114/73 | HR 63 | Temp 97.9°F | Ht 64.0 in | Wt 237.0 lb

## 2017-09-27 DIAGNOSIS — Z9189 Other specified personal risk factors, not elsewhere classified: Secondary | ICD-10-CM

## 2017-09-27 DIAGNOSIS — F3289 Other specified depressive episodes: Secondary | ICD-10-CM | POA: Diagnosis not present

## 2017-09-27 DIAGNOSIS — Z6841 Body Mass Index (BMI) 40.0 and over, adult: Secondary | ICD-10-CM | POA: Diagnosis not present

## 2017-09-27 DIAGNOSIS — E559 Vitamin D deficiency, unspecified: Secondary | ICD-10-CM | POA: Diagnosis not present

## 2017-09-27 MED ORDER — VITAMIN D (ERGOCALCIFEROL) 1.25 MG (50000 UNIT) PO CAPS: ORAL_CAPSULE | ORAL | 1 refills | Status: DC

## 2017-09-27 MED ORDER — BUPROPION HCL ER (SR) 200 MG PO TB12
200.0000 mg | ORAL_TABLET | Freq: Every day | ORAL | 0 refills | Status: DC
Start: 2017-09-27 — End: 2018-01-10

## 2017-09-27 MED FILL — BUPROPION HCL SR 200 MG TAB: 200 | 30 days supply | Qty: 30 | Fill #0

## 2017-09-27 MED FILL — VIT D2 1.25 MG (50,000 UNIT: 1.25 MG | 84 days supply | Qty: 12 | Fill #0

## 2017-09-27 NOTE — Progress Notes (Signed)
Office: [phone]  /  Fax: [phone]   HPI:   Chief Complaint: OBESITY Ellisyn is here to discuss her progress with her obesity treatment plan. She is on the keep a food journal with 300-450 calories and 35 grams of protein at lunch daily and follow the Category 2 plan and is following her eating plan approximately 25 % of the time. She states she is bike riding and walking for 20-30 minutes 1-3 times per week. Tanyah has done well maintaining weight loss, she is journaling on and off but has gone back to skipping meals.  Her weight is 237 lb (107.5 kg) today and has not lost weight since her last visit. She has lost 10 lbs since starting treatment with [place].  Vitamin D Deficiency Lukisha has a diagnosis of vitamin D deficiency. She is stable on prescription Vit D and denies nausea, vomiting or muscle weakness.  At risk for osteopenia and osteoporosis Laneka is at higher risk of osteopenia and osteoporosis due to vitamin D deficiency.   Depression with emotional eating behaviors Michale is still struggling with emotional eating and decreased meal planning and prepping. She is on Wellbutrin SR 150 mg but not much improvement. Dajane struggles with emotional eating and using food for comfort to the extent that it is negatively impacting her health. She often snacks when she is not hungry. Myasia sometimes feels she is out of control and then feels guilty that she made poor food choices. She has been working on behavior modification techniques to help reduce her emotional eating and has been somewhat successful. She shows no sign of suicidal or homicidal ideations.  Depression screen [facility] 2/9 07/03/2017  Decreased Interest 0  Down, Depressed, Hopeless 0  PHQ - 2 Score 0  Altered sleeping 1  Tired, decreased energy 2  Change in appetite 0  Feeling bad or failure about yourself  0  Trouble concentrating 1  Moving slowly or fidgety/restless 0  Suicidal thoughts 0  PHQ-9 Score 4    Difficult doing work/chores Not difficult at all  Some recent data might be hidden    ALLERGIES: Allergies  Allergen Reactions  . Losartan Cough  . Aspirin Other (See Comments)    GI upset REACTION: Upset stomach  . Atorvastatin     myalgias  . Codeine   . Amoxicillin Rash  . Erythromycin Rash  . Penicillins Rash    MEDICATIONS: Current Outpatient Medications on File Prior to Visit  Medication Sig Dispense Refill  . acetaminophen (TYLENOL) 325 MG tablet Take 650 mg by mouth every 6 (six) hours as needed.    Marland Kitchen albuterol (VENTOLIN HFA) 108 (90 Base) MCG/ACT inhaler INHALE 2 PUFFS INTO THE LUNGS EVERY 4 HOURS AS NEEDED FOR WHEEZING OR SHORTNESS OF BREATH. 18 g 6  . benzonatate (TESSALON) 100 MG capsule Take 1 capsule (100 mg total) by mouth 3 (three) times daily as needed for cough. 20 capsule 0  . BYSTOLIC 20 MG TABS TAKE 1 TABLET BY MOUTH ONCE DAILY 90 tablet 2  . Cod Liver Oil 1000 MG CAPS Take 1 capsule by mouth daily.    . diphenhydrAMINE (BENADRYL) 25 MG tablet Take 25 mg by mouth every 6 (six) hours as needed.    . fenofibrate micronized (LOFIBRA) 134 MG capsule TAKE 1 CAPSULE BY MOUTH DAILY BEFORE BREAKFAST. 30 capsule 0  . fenofibrate micronized (LOFIBRA) 134 MG capsule Take 1 capsule (134 mg total) by mouth daily before breakfast. 30 capsule 3  . furosemide (LASIX) 20  MG tablet TAKE 1 TO 2 TABLETS BY MOUTH DAILY AS NEEDED FOR FLUID OR EDEMA 40 tablet 1  . gabapentin (NEURONTIN) 300 MG capsule Take 1 capsule (300 mg total) by mouth at bedtime. 30 capsule 3  . loratadine (CLARITIN) 10 MG tablet Take 10 mg by mouth daily.    . metFORMIN (GLUCOPHAGE) 1000 MG tablet Take 1 tablet (1,000 mg total) by mouth daily with breakfast. 30 tablet 0  . modafinil (PROVIGIL) 200 MG tablet Take 2 tablets (400 mg total) by mouth daily. 60 tablet 5  . spironolactone (ALDACTONE) 25 MG tablet TAKE 2 TABLETS BY MOUTH DAILY 60 tablet 4  . spironolactone (ALDACTONE) 50 MG tablet Take 1 tablet (50  mg total) by mouth daily. 30 tablet 5  . SYNTHROID 100 MCG tablet Take 1 tablet (100 mcg total) by mouth daily before breakfast. 90 tablet 1   No current facility-administered medications on file prior to visit.     PAST MEDICAL HISTORY: Past Medical History:  Diagnosis Date  . Acute bronchitis 05/25/2016  . Anxiety   . Chronic pain syndrome 01/06/2013  . Chronic rhinosinusitis   . Colon polyps   . Cough 01/06/2013  . Depression   . Diabetes ([place]) 01/06/2013  . Diabetes mellitus type 2 in obese [facility]) 01/06/2013   Sees Dr Calvert Cantor for eye exam Does not see podiatry, foot exam today unremarkable except for thick cracking skin on heals   . Dysuria 05/25/2016  . Edema 01/06/2013  . Epistaxis 05/25/2016  . Fibroid, uterine   . GERD (gastroesophageal reflux disease)   . Glaucoma 12-13  . Hoarseness   . Hoarseness of voice 05/11/2013  . Hyperglycemia 04/13/2013  . Hyperlipemia, mixed 06/06/2007   Qualifier: Diagnosis of  By: Wynona Luna She feels Lipitor caused increased low back pain and weakness    . Hyperlipidemia   . Hypertension   . Hypokalemia 02/05/2013  . IBS (irritable bowel syndrome) 02/13/2011  . Low back pain 03/03/2013  . Muscle cramp 05/22/2014  . Obesity   . Obesity, unspecified 05/11/2013  . OSA (obstructive sleep apnea)   . Pain in joint, shoulder region 06/27/2015  . Perimenopause 10/05/2012  . Raynaud disease 06/16/2013  . Thyroid disease    hypothyroidism  . Vocal fold nodules     PAST SURGICAL HISTORY: Past Surgical History:  Procedure Laterality Date  . ABDOMINAL HYSTERECTOMY    . BREAST EXCISIONAL BIOPSY Right    at age 29  . CHOLECYSTECTOMY    . CYSTECTOMY    . DILATION AND CURETTAGE OF UTERUS     x2  . OOPHORECTOMY    . POLYPECTOMY    . ROTATOR CUFF REPAIR      SOCIAL HISTORY: Social History   Tobacco Use  . Smoking status: Former Smoker    Packs/day: 0.50    Years: 30.00    Pack years: 15.00    Types: Cigarettes    Last attempt to quit:  04/25/2003    Years since quitting: 14.4  . Smokeless tobacco: Never Used  . Tobacco comment: smoked since age 68  Substance Use Topics  . Alcohol use: No  . Drug use: Never    FAMILY HISTORY: Family History  Problem Relation Age of Onset  . Alcohol abuse Father   . Cancer Father        renal and colon  . Hyperlipidemia Father   . Hypertension Father   . Stroke Father   . Heart disease Father   .  Cancer Paternal Grandfather        colon  . Diabetes Unknown   . High blood pressure Mother   . High Cholesterol Mother   . Kidney disease Mother   . Depression Mother   . Anxiety disorder Mother   . Obesity Mother   . Breast cancer Other 45    ROS: Review of Systems  Constitutional: Negative for weight loss.  Gastrointestinal: Negative for nausea and vomiting.  Musculoskeletal:       Negative muscle weakness  Psychiatric/Behavioral: Positive for depression. Negative for suicidal ideas.    PHYSICAL EXAM: Blood pressure 114/73, pulse 63, temperature 97.9 F (36.6 C), temperature source Oral, height 5\' 4"  (1.626 m), weight 237 lb (107.5 kg), SpO2 99 %. Body mass index is 40.68 kg/m. Physical Exam  Constitutional: She is oriented to person, place, and time. She appears well-developed and well-nourished.  Cardiovascular: Normal rate.  Pulmonary/Chest: Effort normal.  Musculoskeletal: Normal range of motion.  Neurological: She is oriented to person, place, and time.  Skin: Skin is warm and dry.  Psychiatric: She has a normal mood and affect. Her behavior is normal.  Vitals reviewed.   RECENT LABS AND TESTS: BMET    Component Value Date/Time   NA 139 07/03/2017 1002   K 4.4 07/03/2017 1002   CL 105 07/03/2017 1002   CO2 19 (L) 07/03/2017 1002   GLUCOSE 84 07/03/2017 1002   GLUCOSE 89 02/22/2017 0951   BUN 8 07/03/2017 1002   CREATININE 0.91 07/03/2017 1002   CREATININE 1.07 11/18/2013 0954   CALCIUM 9.7 07/03/2017 1002   GFRNONAA 71 07/03/2017 1002   GFRAA 82  07/03/2017 1002   Lab Results  Component Value Date   HGBA1C 6.2 (H) 07/03/2017   HGBA1C 6.3 05/23/2017   HGBA1C 6.0 02/22/2017   HGBA1C 6.3 10/11/2016   HGBA1C 6.2 07/11/2016   Lab Results  Component Value Date   INSULIN 46.3 (H) 07/03/2017   CBC    Component Value Date/Time   WBC 4.4 07/03/2017 1002   WBC 5.0 05/23/2017 0842   RBC 4.40 07/03/2017 1002   RBC 4.56 05/23/2017 0842   HGB 12.9 07/03/2017 1002   HCT 40.6 07/03/2017 1002   PLT 378.0 05/23/2017 0842   MCV 92 07/03/2017 1002   MCH 29.3 07/03/2017 1002   MCH 30.3 11/18/2013 0954   MCHC 31.8 07/03/2017 1002   MCHC 33.5 05/23/2017 0842   RDW 14.4 07/03/2017 1002   LYMPHSABS 1.9 07/03/2017 1002   MONOABS 0.5 05/23/2017 0842   EOSABS 0.1 07/03/2017 1002   BASOSABS 0.0 07/03/2017 1002   Iron/TIBC/Ferritin/ %Sat No results found for: IRON, TIBC, FERRITIN, IRONPCTSAT Lipid Panel     Component Value Date/Time   CHOL 210 (H) 07/03/2017 1002   TRIG 87 07/03/2017 1002   HDL 47 07/03/2017 1002   CHOLHDL 4 05/23/2017 0842   VLDL 23.0 05/23/2017 0842   LDLCALC 146 (H) 07/03/2017 1002   LDLDIRECT 103.0 07/11/2016 0953   Hepatic Function Panel     Component Value Date/Time   PROT 6.7 07/03/2017 1002   ALBUMIN 4.4 07/03/2017 1002   AST 17 07/03/2017 1002   ALT 19 07/03/2017 1002   ALKPHOS 62 07/03/2017 1002   BILITOT <0.2 07/03/2017 1002   BILIDIR <0.1 11/18/2013 0954   IBILI NOT CALC 11/18/2013 0954      Component Value Date/Time   TSH 0.788 07/03/2017 1002   TSH 1.11 05/23/2017 0842   TSH 0.47 02/22/2017 0951  Results for Moffat,  KORRINA ZERN (MRN [id number]) as of 09/27/2017 16:11  Ref. Range 07/03/2017 10:02  Vitamin D, 25-Hydroxy Latest Ref Range: 30.0 - 100.0 ng/mL 40.5    ASSESSMENT AND PLAN: Vitamin D deficiency - Plan: Vitamin D, Ergocalciferol, (DRISDOL) 50000 units CAPS capsule  Other depression - with emotional eating - Plan: buPROPion (WELLBUTRIN SR) 200 MG 12 hr tablet  At risk for  osteoporosis  Class 3 severe obesity with serious comorbidity and body mass index (BMI) of 40.0 to 44.9 in adult, unspecified obesity type (Meta)  PLAN:  Vitamin D Deficiency Jamella was informed that low vitamin D levels contributes to fatigue and are associated with obesity, breast, and colon cancer. Saprina agrees to continue taking prescription Vit D @50 ,000 IU every week #4 and we will refill for 1 month. She will follow up for routine testing of vitamin D, at least 2-3 times per year. She was informed of the risk of over-replacement of vitamin D and agrees to not increase her dose unless she discusses this with [place] first. Sabella agrees to follow up with our clinic in 2 to 3 weeks.  At risk for osteopenia and osteoporosis Alis is at risk for osteopenia and osteoporsis due to her vitamin D deficiency. She was encouraged to take her vitamin D and follow her higher calcium diet and increase strengthening exercise to help strengthen her bones and decrease her risk of osteopenia and osteoporosis.  Depression with Emotional Eating Behaviors We discussed behavior modification techniques today to help Allisha deal with her emotional eating and depression. Latonga agrees increase Wellbutrin SR to 200 mg q AM #30 with no refills. Teryl agrees to follow up with our clinic in 2 to 3 weeks.   Obesity Conner is currently in the action stage of change. As such, her goal is to continue with weight loss efforts She has agreed to change to keep a food journal with 1200-1400 calories and 90+ grams of protein daily Dann has been instructed to work up to a goal of 150 minutes of combined cardio and strengthening exercise per week for weight loss and overall health benefits. We discussed the following Behavioral Modification Strategies today: increasing lean protein intake, decreasing simple carbohydrates, and no skipping meals    Suzanna has agreed to follow up with our clinic in 2 to 3 weeks. She was  informed of the importance of frequent follow up visits to maximize her success with intensive lifestyle modifications for her multiple health conditions.   OBESITY BEHAVIORAL INTERVENTION VISIT  Today's visit was # 5 out of 22.  Starting weight: 247 lbs Starting date: 07/03/17 Today's weight : 237 lbs  Today's date: 09/27/2017 Total lbs lost to date: 10 (Patients must lose 7 lbs in the first 6 months to continue with counseling)   ASK: We discussed the diagnosis of obesity with [name] today and Lidya agreed to give [place] permission to discuss obesity behavioral modification therapy today.  ASSESS: Refugia has the diagnosis of obesity and her BMI today is 40.66 Shamila is in the action stage of change   ADVISE: Ninel was educated on the multiple health risks of obesity as well as the benefit of weight loss to improve her health. She was advised of the need for long term treatment and the importance of lifestyle modifications.  AGREE: Multiple dietary modification options and treatment options were discussed and  Matrice agreed to the above obesity treatment plan.  Wilhemena Durie, am acting as transcriptionist for Dennard Nip, MD  I  have reviewed the above documentation for accuracy and completeness, and I agree with the above. -Dennard Nip, MD

## 2017-10-01 MED FILL — MODAFINIL 200 MG TABLET: 200 | 30 days supply | Qty: 60 | Fill #0

## 2017-10-08 ENCOUNTER — Ambulatory Visit: Payer: 59 | Admitting: Family Medicine

## 2017-10-08 ENCOUNTER — Encounter: Payer: Self-pay | Admitting: Family Medicine

## 2017-10-08 VITALS — BP 118/70 | HR 63 | Temp 98.0°F | Resp 18 | Wt 241.0 lb

## 2017-10-08 DIAGNOSIS — R05 Cough: Secondary | ICD-10-CM | POA: Diagnosis not present

## 2017-10-08 DIAGNOSIS — E669 Obesity, unspecified: Secondary | ICD-10-CM | POA: Diagnosis not present

## 2017-10-08 DIAGNOSIS — E782 Mixed hyperlipidemia: Secondary | ICD-10-CM

## 2017-10-08 DIAGNOSIS — E559 Vitamin D deficiency, unspecified: Secondary | ICD-10-CM

## 2017-10-08 DIAGNOSIS — R490 Dysphonia: Secondary | ICD-10-CM

## 2017-10-08 DIAGNOSIS — K589 Irritable bowel syndrome without diarrhea: Secondary | ICD-10-CM | POA: Diagnosis not present

## 2017-10-08 DIAGNOSIS — R42 Dizziness and giddiness: Secondary | ICD-10-CM

## 2017-10-08 DIAGNOSIS — E1169 Type 2 diabetes mellitus with other specified complication: Secondary | ICD-10-CM | POA: Diagnosis not present

## 2017-10-08 DIAGNOSIS — I10 Essential (primary) hypertension: Secondary | ICD-10-CM

## 2017-10-08 DIAGNOSIS — R059 Cough, unspecified: Secondary | ICD-10-CM

## 2017-10-08 DIAGNOSIS — E038 Other specified hypothyroidism: Secondary | ICD-10-CM | POA: Diagnosis not present

## 2017-10-08 LAB — LIPID PANEL
Cholesterol: 200 mg/dL (ref 0–200)
HDL: 54.2 mg/dL (ref >39.00)
LDL Cholesterol: 130 mg/dL — ABNORMAL HIGH (ref 0–99)
NonHDL: 146.26
Total CHOL/HDL Ratio: 4
Triglycerides: 83 mg/dL (ref 0.0–149.0)
VLDL: 16.6 mg/dL (ref 0.0–40.0)

## 2017-10-08 LAB — COMPREHENSIVE METABOLIC PANEL
ALT: 19 U/L (ref 0–35)
AST: 16 U/L (ref 0–37)
Albumin: 4.5 g/dL (ref 3.5–5.2)
Alkaline Phosphatase: 53 U/L (ref 39–117)
BUN: 9 mg/dL (ref 6–23)
CO2: 25 mEq/L (ref 19–32)
Calcium: 10.2 mg/dL (ref 8.4–10.5)
Chloride: 106 mEq/L (ref 96–112)
Creatinine, Ser: 0.94 mg/dL (ref 0.40–1.20)
GFR: 78.97 mL/min (ref >60.00)
Glucose, Bld: 88 mg/dL (ref 70–99)
Potassium: 4.4 mEq/L (ref 3.5–5.1)
Sodium: 141 mEq/L (ref 135–145)
Total Bilirubin: 0.3 mg/dL (ref 0.2–1.2)
Total Protein: 6.9 g/dL (ref 6.0–8.3)

## 2017-10-08 LAB — HEMOGLOBIN A1C: Hgb A1c MFr Bld: 6.2 % (ref 4.6–6.5)

## 2017-10-08 LAB — CBC
HCT: 40.5 % (ref 36.0–46.0)
Hemoglobin: 13.6 g/dL (ref 12.0–15.0)
MCHC: 33.5 g/dL (ref 30.0–36.0)
MCV: 88.9 fl (ref 78.0–100.0)
Platelets: 377 10*3/uL (ref 150.0–400.0)
RBC: 4.55 Mil/uL (ref 3.87–5.11)
RDW: 13.9 % (ref 11.5–15.5)
WBC: 3.8 10*3/uL — ABNORMAL LOW (ref 4.0–10.5)

## 2017-10-08 LAB — TSH: TSH: 0.7 u[IU]/mL (ref 0.35–4.50)

## 2017-10-08 LAB — VITAMIN D 25 HYDROXY (VIT D DEFICIENCY, FRACTURES): VITD: 40.82 ng/mL (ref 30.00–100.00)

## 2017-10-08 MED ORDER — GABAPENTIN 300 MG PO CAPS: 300.0000 mg | ORAL_CAPSULE | Freq: Two times a day (BID) | ORAL | 3 refills | Status: DC

## 2017-10-08 MED FILL — GABAPENTIN 300 MG CAPSULE: 300 | 30 days supply | Qty: 60 | Fill #0

## 2017-10-08 NOTE — Assessment & Plan Note (Signed)
Persistent and now very frustrated with cough and allergies she is referred back to [facility] for further evaluation

## 2017-10-08 NOTE — Assessment & Plan Note (Signed)
Encouraged heart healthy diet, increase exercise, avoid trans fats, consider a krill oil cap daily 

## 2017-10-08 NOTE — Assessment & Plan Note (Signed)
On Levothyroxine, continue to monitor 

## 2017-10-08 NOTE — Assessment & Plan Note (Signed)
Well controlled, no changes to meds. Encouraged heart healthy diet such as the DASH diet and exercise as tolerated.  °

## 2017-10-08 NOTE — Assessment & Plan Note (Signed)
Notes alternates between watery stool several times a day to a week with no BM and requires a soap suds enema.

## 2017-10-08 NOTE — Assessment & Plan Note (Signed)
hgba1c acceptable, minimize simple carbs. Increase exercise as tolerated. Continue current meds 

## 2017-10-08 NOTE — Patient Instructions (Addendum)
Encouraged increased hydration and fiber in diet. Daily probiotics. If bowels not moving can use MOM 2 tbls po in 4 oz of warm prune juice by mouth every 2-3 days. If no results then repeat in 4 hours with  Dulcolax suppository pr, may repeat again in 4 more hours as needed. Seek care if symptoms worsen. Consider daily Miralax and/or Dulcolax if symptoms persist.   Try mixing Miralax and benefiber together once or twice daily  Try the gatorade daily  Hypertension Hypertension is another name for high blood pressure. High blood pressure forces your heart to work harder to pump blood. This can cause problems over time. There are two numbers in a blood pressure reading. There is a top number (systolic) over a bottom number (diastolic). It is best to have a blood pressure below 120/80. Healthy choices can help lower your blood pressure. You may need medicine to help lower your blood pressure if:  Your blood pressure cannot be lowered with healthy choices.  Your blood pressure is higher than 130/80.  Follow these instructions at home: Eating and drinking  If directed, follow the DASH eating plan. This diet includes: ? Filling half of your plate at each meal with fruits and vegetables. ? Filling one quarter of your plate at each meal with whole grains. Whole grains include whole wheat pasta, brown rice, and whole grain bread. ? Eating or drinking low-fat dairy products, such as skim milk or low-fat yogurt. ? Filling one quarter of your plate at each meal with low-fat (lean) proteins. Low-fat proteins include fish, skinless chicken, eggs, beans, and tofu. ? Avoiding fatty meat, cured and processed meat, or chicken with skin. ? Avoiding premade or processed food.  Eat less than 1,500 mg of salt (sodium) a day.  Limit alcohol use to no more than 1 drink a day for nonpregnant women and 2 drinks a day for men. One drink equals 12 oz of beer, 5 oz of wine, or 1 oz of hard liquor. Lifestyle  Work  with your doctor to stay at a healthy weight or to lose weight. Ask your doctor what the best weight is for you.  Get at least 30 minutes of exercise that causes your heart to beat faster (aerobic exercise) most days of the week. This may include walking, swimming, or biking.  Get at least 30 minutes of exercise that strengthens your muscles (resistance exercise) at least 3 days a week. This may include lifting weights or pilates.  Do not use any products that contain nicotine or tobacco. This includes cigarettes and e-cigarettes. If you need help quitting, ask your doctor.  Check your blood pressure at home as told by your doctor.  Keep all follow-up visits as told by your doctor. This is important. Medicines  Take over-the-counter and prescription medicines only as told by your doctor. Follow directions carefully.  Do not skip doses of blood pressure medicine. The medicine does not work as well if you skip doses. Skipping doses also puts you at risk for problems.  Ask your doctor about side effects or reactions to medicines that you should watch for. Contact a doctor if:  You think you are having a reaction to the medicine you are taking.  You have headaches that keep coming back (recurring).  You feel dizzy.  You have swelling in your ankles.  You have trouble with your vision. Get help right away if:  You get a very bad headache.  You start to feel confused.  You  feel weak or numb.  You feel faint.  You get very bad pain in your: ? Chest. ? Belly (abdomen).  You throw up (vomit) more than once.  You have trouble breathing. Summary  Hypertension is another name for high blood pressure.  Making healthy choices can help lower blood pressure. If your blood pressure cannot be controlled with healthy choices, you may need to take medicine. This information is not intended to replace advice given to you by your health care provider. Make sure you discuss any questions  you have with your health care provider. Document Released: 09/27/2007 Document Revised: 03/08/2016 Document Reviewed: 03/08/2016 Elsevier Interactive Patient Education  [employer].

## 2017-10-08 NOTE — Assessment & Plan Note (Addendum)
Check level continue daily suplements

## 2017-10-08 NOTE — Progress Notes (Signed)
Subjective:  I acted as a Education administrator for Dr. Charlett Blake. [name], [name]  Patient ID: [name], female    DOB: October 02, 1960, 57 y.o.   MRN: [id number]  No chief complaint on file.   HPI  Patient is in today for 3 month follow up and she notes persistent difficulty with hoarseness, head congestion and cough. She denies any recent febrile illness or hospitalizations. She is having occasional episodes of feeling light headed especially with position changes. No syncope or falls. Denies CP/palp/SOB/HA/congestion/fevers/GI or GU c/o. Taking meds as prescribed  Patient Care Team: Mosie Lukes, MD as PCP - General (Family Medicine) Molli Posey, MD as Consulting Physician (Obstetrics and Gynecology) Virgina Evener, OD as Referring Physician (Optometry)   Past Medical History:  Diagnosis Date  . Acute bronchitis 05/25/2016  . Anxiety   . Chronic pain syndrome 01/06/2013  . Chronic rhinosinusitis   . Colon polyps   . Cough 01/06/2013  . Depression   . Diabetes ([place]) 01/06/2013  . Diabetes mellitus type 2 in obese [facility]) 01/06/2013   Sees Dr Calvert Cantor for eye exam Does not see podiatry, foot exam today unremarkable except for thick cracking skin on heals   . Dysuria 05/25/2016  . Edema 01/06/2013  . Epistaxis 05/25/2016  . Fibroid, uterine   . GERD (gastroesophageal reflux disease)   . Glaucoma 12-13  . Hoarseness   . Hoarseness of voice 05/11/2013  . Hyperglycemia 04/13/2013  . Hyperlipemia, mixed 06/06/2007   Qualifier: Diagnosis of  By: Wynona Luna She feels Lipitor caused increased low back pain and weakness    . Hyperlipidemia   . Hypertension   . Hypokalemia 02/05/2013  . IBS (irritable bowel syndrome) 02/13/2011  . Low back pain 03/03/2013  . Muscle cramp 05/22/2014  . Obesity   . Obesity, unspecified 05/11/2013  . OSA (obstructive sleep apnea)   . Pain in joint, shoulder region 06/27/2015  . Perimenopause 10/05/2012  . Raynaud disease 06/16/2013  . Thyroid disease    hypothyroidism  . Vocal fold nodules     Past Surgical History:  Procedure Laterality Date  . ABDOMINAL HYSTERECTOMY    . BREAST EXCISIONAL BIOPSY Right    at age 64  . CHOLECYSTECTOMY    . CYSTECTOMY    . DILATION AND CURETTAGE OF UTERUS     x2  . OOPHORECTOMY    . POLYPECTOMY    . ROTATOR CUFF REPAIR      Family History  Problem Relation Age of Onset  . Alcohol abuse Father   . Cancer Father        renal and colon  . Hyperlipidemia Father   . Hypertension Father   . Stroke Father   . Heart disease Father   . Cancer Paternal Grandfather        colon  . Diabetes Unknown   . High blood pressure Mother   . High Cholesterol Mother   . Kidney disease Mother   . Depression Mother   . Anxiety disorder Mother   . Obesity Mother   . Breast cancer Other 61    Social History   Socioeconomic History  . Marital status: Significant Other    Spouse name: Not on file  . Number of children: 0  . Years of education: Not on file  . Highest education level: Not on file  Occupational History  . Occupation: Technical sales engineer  Social Needs  . Financial resource strain: Not on file  . Food insecurity:  Worry: Not on file    Inability: Not on file  . Transportation needs:    Medical: Not on file    Non-medical: Not on file  Tobacco Use  . Smoking status: Former Smoker    Packs/day: 0.50    Years: 30.00    Pack years: 15.00    Types: Cigarettes    Last attempt to quit: 04/25/2003    Years since quitting: 14.4  . Smokeless tobacco: Never Used  . Tobacco comment: smoked since age 68  Substance and Sexual Activity  . Alcohol use: No  . Drug use: Never  . Sexual activity: Yes    Partners: Male  Lifestyle  . Physical activity:    Days per week: Not on file    Minutes per session: Not on file  . Stress: Not on file  Relationships  . Social connections:    Talks on phone: Not on file    Gets together: Not on file    Attends religious service: Not on file     Active member of club or organization: Not on file    Attends meetings of clubs or organizations: Not on file    Relationship status: Not on file  . Intimate partner violence:    Fear of current or ex partner: Not on file    Emotionally abused: Not on file    Physically abused: Not on file    Forced sexual activity: Not on file  Other Topics Concern  . Not on file  Social History Narrative   Endo-- Dr Dwyane Dee   ENT--Dr shoemaker   GI--Dr Buccini   Pulm--Dr Clance   Rheum--Dr Charlestine Night          Outpatient Medications Prior to Visit  Medication Sig Dispense Refill  . acetaminophen (TYLENOL) 325 MG tablet Take 650 mg by mouth every 6 (six) hours as needed.    Marland Kitchen albuterol (VENTOLIN HFA) 108 (90 Base) MCG/ACT inhaler INHALE 2 PUFFS INTO THE LUNGS EVERY 4 HOURS AS NEEDED FOR WHEEZING OR SHORTNESS OF BREATH. 18 g 6  . benzonatate (TESSALON) 100 MG capsule Take 1 capsule (100 mg total) by mouth 3 (three) times daily as needed for cough. 20 capsule 0  . buPROPion (WELLBUTRIN SR) 200 MG 12 hr tablet Take 1 tablet (200 mg total) by mouth daily at 12 noon. 30 tablet 0  . BYSTOLIC 20 MG TABS TAKE 1 TABLET BY MOUTH ONCE DAILY 90 tablet 2  . Cod Liver Oil 1000 MG CAPS Take 1 capsule by mouth daily.    . diphenhydrAMINE (BENADRYL) 25 MG tablet Take 25 mg by mouth every 6 (six) hours as needed.    . fenofibrate micronized (LOFIBRA) 134 MG capsule TAKE 1 CAPSULE BY MOUTH DAILY BEFORE BREAKFAST. 30 capsule 0  . furosemide (LASIX) 20 MG tablet TAKE 1 TO 2 TABLETS BY MOUTH DAILY AS NEEDED FOR FLUID OR EDEMA 40 tablet 1  . loratadine (CLARITIN) 10 MG tablet Take 10 mg by mouth daily.    . metFORMIN (GLUCOPHAGE) 1000 MG tablet Take 1 tablet (1,000 mg total) by mouth daily with breakfast. 30 tablet 0  . modafinil (PROVIGIL) 200 MG tablet Take 2 tablets (400 mg total) by mouth daily. 60 tablet 5  . spironolactone (ALDACTONE) 50 MG tablet Take 1 tablet (50 mg total) by mouth daily. 30 tablet 5  . SYNTHROID 100  MCG tablet Take 1 tablet (100 mcg total) by mouth daily before breakfast. 90 tablet 1  . Vitamin D, Ergocalciferol, (DRISDOL)  50000 units CAPS capsule TAKE 1 CAPSULE BY MOUTH EVERY 7 DAYS 12 capsule 1  . fenofibrate micronized (LOFIBRA) 134 MG capsule Take 1 capsule (134 mg total) by mouth daily before breakfast. 30 capsule 3  . gabapentin (NEURONTIN) 300 MG capsule Take 1 capsule (300 mg total) by mouth at bedtime. 30 capsule 3  . spironolactone (ALDACTONE) 25 MG tablet TAKE 2 TABLETS BY MOUTH DAILY 60 tablet 4   No facility-administered medications prior to visit.     Allergies  Allergen Reactions  . Losartan Cough  . Aspirin Other (See Comments)    GI upset REACTION: Upset stomach  . Atorvastatin     myalgias  . Codeine   . Amoxicillin Rash  . Erythromycin Rash  . Penicillins Rash    Review of Systems  Constitutional: Positive for malaise/fatigue. Negative for fever.  HENT: Positive for congestion and tinnitus. Negative for ear discharge.   Eyes: Negative for blurred vision.  Respiratory: Positive for cough. Negative for shortness of breath.   Cardiovascular: Negative for chest pain, palpitations and leg swelling.  Gastrointestinal: Negative for abdominal pain, blood in stool and nausea.  Genitourinary: Negative for dysuria and frequency.  Musculoskeletal: Positive for back pain. Negative for falls.  Skin: Negative for rash.  Neurological: Positive for dizziness. Negative for loss of consciousness and headaches.  Endo/Heme/Allergies: Negative for environmental allergies.  Psychiatric/Behavioral: Negative for depression. The patient is not nervous/anxious.        Objective:    Physical Exam  Constitutional: She is oriented to person, place, and time. No distress.  HENT:  Head: Normocephalic and atraumatic.  Eyes: Conjunctivae are normal.  Neck: Neck supple. No thyromegaly present.  Cardiovascular: Normal rate, regular rhythm and normal heart sounds.  No murmur  heard. Pulmonary/Chest: Effort normal and breath sounds normal. She has no wheezes.  Abdominal: She exhibits no distension and no mass.  Musculoskeletal: She exhibits no edema.  Lymphadenopathy:    She has no cervical adenopathy.  Neurological: She is alert and oriented to person, place, and time.  Skin: Skin is warm and dry. No rash noted. She is not diaphoretic.  Psychiatric: Judgment normal.    BP 118/70 (BP Location: Left Arm, Patient Position: Sitting, Cuff Size: Large)   Pulse 63   Temp 98 F (36.7 C) (Oral)   Resp 18   Wt 241 lb (109.3 kg)   SpO2 98%   BMI 41.37 kg/m  Wt Readings from Last 3 Encounters:  10/08/17 241 lb (109.3 kg)  09/27/17 237 lb (107.5 kg)  09/06/17 237 lb (107.5 kg)   BP Readings from Last 3 Encounters:  10/08/17 118/70  09/27/17 114/73  09/06/17 121/75     Immunization History  Administered Date(s) Administered  . Influenza Whole 01/23/2008, 01/28/2009, 02/16/2010  . Influenza,inj,Quad PF,6+ Mos 01/06/2013, 01/10/2016  . Influenza-Unspecified 01/22/2014, 02/09/2015, 01/17/2017  . Pneumococcal Conjugate-13 05/22/2014  . Pneumococcal Polysaccharide-23 03/22/2015  . Td 12/18/2000  . Tdap 02/10/2014    Health Maintenance  Topic Date Due  . OPHTHALMOLOGY EXAM  02/04/2014  . FOOT EXAM  08/19/2016  . URINE MICROALBUMIN  10/11/2017  . INFLUENZA VACCINE  11/22/2017  . HEMOGLOBIN A1C  01/03/2018  . PAP SMEAR  06/29/2018  . MAMMOGRAM  08/15/2019  . PNEUMOCOCCAL POLYSACCHARIDE VACCINE (2) 03/21/2020  . COLONOSCOPY  04/05/2022  . TETANUS/TDAP  02/11/2024  . Hepatitis C Screening  Completed  . HIV Screening  Completed    Lab Results  Component Value Date   WBC 3.8 (L)  10/08/2017   HGB 13.6 10/08/2017   HCT 40.5 10/08/2017   PLT 377.0 10/08/2017   GLUCOSE 88 10/08/2017   CHOL 200 10/08/2017   TRIG 83.0 10/08/2017   HDL 54.20 10/08/2017   LDLDIRECT 103.0 07/11/2016   LDLCALC 130 (H) 10/08/2017   ALT 19 10/08/2017   AST 16 10/08/2017    NA 141 10/08/2017   K 4.4 10/08/2017   CL 106 10/08/2017   CREATININE 0.94 10/08/2017   BUN 9 10/08/2017   CO2 25 10/08/2017   TSH 0.70 10/08/2017   HGBA1C 6.2 10/08/2017   MICROALBUR 0.7 10/11/2016    Lab Results  Component Value Date   TSH 0.70 10/08/2017   Lab Results  Component Value Date   WBC 3.8 (L) 10/08/2017   HGB 13.6 10/08/2017   HCT 40.5 10/08/2017   MCV 88.9 10/08/2017   PLT 377.0 10/08/2017   Lab Results  Component Value Date   NA 141 10/08/2017   K 4.4 10/08/2017   CO2 25 10/08/2017   GLUCOSE 88 10/08/2017   BUN 9 10/08/2017   CREATININE 0.94 10/08/2017   BILITOT 0.3 10/08/2017   ALKPHOS 53 10/08/2017   AST 16 10/08/2017   ALT 19 10/08/2017   PROT 6.9 10/08/2017   ALBUMIN 4.5 10/08/2017   CALCIUM 10.2 10/08/2017   GFR 78.97 10/08/2017   Lab Results  Component Value Date   CHOL 200 10/08/2017   Lab Results  Component Value Date   HDL 54.20 10/08/2017   Lab Results  Component Value Date   LDLCALC 130 (H) 10/08/2017   Lab Results  Component Value Date   TRIG 83.0 10/08/2017   Lab Results  Component Value Date   CHOLHDL 4 10/08/2017   Lab Results  Component Value Date   HGBA1C 6.2 10/08/2017         Assessment & Plan:   Problem List Items Addressed This Visit    Hypothyroidism    On Levothyroxine, continue to monitor      Hyperlipemia, mixed    Encouraged heart healthy diet, increase exercise, avoid trans fats, consider a krill oil cap daily      Relevant Orders   Lipid panel (Completed)   Essential hypertension    Well controlled, no changes to meds. Encouraged heart healthy diet such as the DASH diet and exercise as tolerated.       Relevant Orders   CBC (Completed)   Comprehensive metabolic panel (Completed)   TSH (Completed)   IBS (irritable bowel syndrome)    Notes alternates between watery stool several times a day to a week with no BM and requires a soap suds enema.       Relevant Orders   Ambulatory  referral to Gastroenterology   Vitamin D deficiency    Check level continue daily suplements      Relevant Orders   VITAMIN D 25 Hydroxy (Vit-D Deficiency, Fractures) (Completed)   Diabetes mellitus type 2 in obese ([place])    hgba1c acceptable, minimize simple carbs. Increase exercise as tolerated. Continue current meds      Relevant Orders   Hemoglobin A1c (Completed)   Hoarseness of voice    Persistent and now very frustrated with cough and allergies she is referred back to [facility] for further evaluation      Relevant Orders   Ambulatory referral to ENT   Ambulatory referral to Gastroenterology   Cough   Relevant Orders   Ambulatory referral to Gastroenterology    Other Visit Diagnoses  Disequilibrium    -  Primary   Relevant Orders   Ambulatory referral to ENT      I have changed [name]'s gabapentin. I am also having her maintain her spironolactone, SYNTHROID, BYSTOLIC, albuterol, Cod Liver Oil, loratadine, acetaminophen, diphenhydrAMINE, benzonatate, modafinil, metFORMIN, fenofibrate micronized, furosemide, Vitamin D (Ergocalciferol), and buPROPion.  Meds ordered this encounter  Medications  . gabapentin (NEURONTIN) 300 MG capsule    Sig: Take 1 capsule (300 mg total) by mouth 2 (two) times daily.    Dispense:  60 capsule    Refill:  3    CMA served as scribe during this visit. History, Physical and Plan performed by medical provider. Documentation and orders reviewed and attested to.  Penni Homans, MD

## 2017-10-15 ENCOUNTER — Other Ambulatory Visit: Payer: Self-pay | Admitting: Family Medicine

## 2017-10-15 MED FILL — FUROSEMIDE 20 MG TABS: 20 | 20 days supply | Qty: 40 | Fill #0

## 2017-10-16 MED FILL — SPIRONOLACTONE 25 MG TABLET: 25 | 30 days supply | Qty: 60 | Fill #2

## 2017-10-18 ENCOUNTER — Ambulatory Visit (INDEPENDENT_AMBULATORY_CARE_PROVIDER_SITE_OTHER): Payer: 59 | Admitting: Family Medicine

## 2017-10-22 DIAGNOSIS — M5136 Other intervertebral disc degeneration, lumbar region: Secondary | ICD-10-CM | POA: Diagnosis not present

## 2017-10-22 DIAGNOSIS — M9905 Segmental and somatic dysfunction of pelvic region: Secondary | ICD-10-CM | POA: Diagnosis not present

## 2017-10-22 DIAGNOSIS — M9903 Segmental and somatic dysfunction of lumbar region: Secondary | ICD-10-CM | POA: Diagnosis not present

## 2017-10-22 DIAGNOSIS — M9902 Segmental and somatic dysfunction of thoracic region: Secondary | ICD-10-CM | POA: Diagnosis not present

## 2017-10-29 MED FILL — FENOFIBRATE 134 MG CAPSULE: 134 | 30 days supply | Qty: 30 | Fill #2

## 2017-11-06 MED FILL — FUROSEMIDE 20 MG TABS: 20 | 20 days supply | Qty: 40 | Fill #1

## 2017-11-20 DIAGNOSIS — M9905 Segmental and somatic dysfunction of pelvic region: Secondary | ICD-10-CM | POA: Diagnosis not present

## 2017-11-20 DIAGNOSIS — M5136 Other intervertebral disc degeneration, lumbar region: Secondary | ICD-10-CM | POA: Diagnosis not present

## 2017-11-20 DIAGNOSIS — M9902 Segmental and somatic dysfunction of thoracic region: Secondary | ICD-10-CM | POA: Diagnosis not present

## 2017-11-20 DIAGNOSIS — M9903 Segmental and somatic dysfunction of lumbar region: Secondary | ICD-10-CM | POA: Diagnosis not present

## 2017-11-26 DIAGNOSIS — G4733 Obstructive sleep apnea (adult) (pediatric): Secondary | ICD-10-CM | POA: Diagnosis not present

## 2017-11-26 DIAGNOSIS — I10 Essential (primary) hypertension: Secondary | ICD-10-CM | POA: Diagnosis not present

## 2017-11-30 ENCOUNTER — Other Ambulatory Visit: Payer: Self-pay | Admitting: Family Medicine

## 2017-11-30 ENCOUNTER — Encounter: Payer: Self-pay | Admitting: Family Medicine

## 2017-11-30 MED ORDER — SYNTHROID 100 MCG PO TABS: 100.0000 ug | ORAL_TABLET | Freq: Every day | ORAL | 1 refills | Status: DC

## 2017-11-30 MED FILL — SPIRONOLACTONE 25 MG TABLET: 25 | 30 days supply | Qty: 60 | Fill #3

## 2017-11-30 MED FILL — SYNTHROID 100 MCG TABLET: 100 | 90 days supply | Qty: 90 | Fill #0

## 2017-12-05 ENCOUNTER — Other Ambulatory Visit: Payer: Self-pay | Admitting: Family Medicine

## 2017-12-05 MED FILL — FENOFIBRATE 134 MG CAPSULE: 134 | 30 days supply | Qty: 30 | Fill #3

## 2017-12-05 MED FILL — FUROSEMIDE 20 MG TABS: 20 | 20 days supply | Qty: 40 | Fill #0

## 2017-12-18 DIAGNOSIS — M5136 Other intervertebral disc degeneration, lumbar region: Secondary | ICD-10-CM | POA: Diagnosis not present

## 2017-12-18 DIAGNOSIS — M9903 Segmental and somatic dysfunction of lumbar region: Secondary | ICD-10-CM | POA: Diagnosis not present

## 2017-12-18 DIAGNOSIS — M62830 Muscle spasm of back: Secondary | ICD-10-CM | POA: Diagnosis not present

## 2017-12-18 DIAGNOSIS — M9901 Segmental and somatic dysfunction of cervical region: Secondary | ICD-10-CM | POA: Diagnosis not present

## 2017-12-18 DIAGNOSIS — M9902 Segmental and somatic dysfunction of thoracic region: Secondary | ICD-10-CM | POA: Diagnosis not present

## 2017-12-18 DIAGNOSIS — M9905 Segmental and somatic dysfunction of pelvic region: Secondary | ICD-10-CM | POA: Diagnosis not present

## 2017-12-18 MED FILL — VIT D2 1.25 MG (50,000 UNIT: 1.25 MG | 84 days supply | Qty: 12 | Fill #1

## 2017-12-18 MED FILL — MODAFINIL 200 MG TABLET: 200 | 30 days supply | Qty: 60 | Fill #1

## 2017-12-28 MED FILL — FUROSEMIDE 20 MG TABS: 20 | 20 days supply | Qty: 40 | Fill #1

## 2018-01-03 MED FILL — SPIRONOLACTONE 25 MG TABLET: 25 | 30 days supply | Qty: 60 | Fill #4

## 2018-01-10 ENCOUNTER — Ambulatory Visit (INDEPENDENT_AMBULATORY_CARE_PROVIDER_SITE_OTHER): Payer: 59 | Admitting: Family Medicine

## 2018-01-10 VITALS — BP 136/64 | HR 70 | Temp 98.5°F | Resp 18 | Ht 64.0 in | Wt 237.8 lb

## 2018-01-10 DIAGNOSIS — R7303 Prediabetes: Secondary | ICD-10-CM | POA: Diagnosis not present

## 2018-01-10 DIAGNOSIS — E669 Obesity, unspecified: Secondary | ICD-10-CM | POA: Diagnosis not present

## 2018-01-10 DIAGNOSIS — Z23 Encounter for immunization: Secondary | ICD-10-CM | POA: Diagnosis not present

## 2018-01-10 DIAGNOSIS — E782 Mixed hyperlipidemia: Secondary | ICD-10-CM

## 2018-01-10 DIAGNOSIS — E559 Vitamin D deficiency, unspecified: Secondary | ICD-10-CM

## 2018-01-10 DIAGNOSIS — R2233 Localized swelling, mass and lump, upper limb, bilateral: Secondary | ICD-10-CM | POA: Insufficient documentation

## 2018-01-10 DIAGNOSIS — I10 Essential (primary) hypertension: Secondary | ICD-10-CM

## 2018-01-10 DIAGNOSIS — E1169 Type 2 diabetes mellitus with other specified complication: Secondary | ICD-10-CM

## 2018-01-10 LAB — CBC
HCT: 41.6 % (ref 36.0–46.0)
Hemoglobin: 13.8 g/dL (ref 12.0–15.0)
MCHC: 33.1 g/dL (ref 30.0–36.0)
MCV: 89.7 fl (ref 78.0–100.0)
Platelets: 367 10*3/uL (ref 150.0–400.0)
RBC: 4.64 Mil/uL (ref 3.87–5.11)
RDW: 14.1 % (ref 11.5–15.5)
WBC: 4.3 10*3/uL (ref 4.0–10.5)

## 2018-01-10 LAB — COMPREHENSIVE METABOLIC PANEL
ALT: 18 U/L (ref 0–35)
AST: 17 U/L (ref 0–37)
Albumin: 4.4 g/dL (ref 3.5–5.2)
Alkaline Phosphatase: 51 U/L (ref 39–117)
BUN: 14 mg/dL (ref 6–23)
CO2: 26 mEq/L (ref 19–32)
Calcium: 9.7 mg/dL (ref 8.4–10.5)
Chloride: 106 mEq/L (ref 96–112)
Creatinine, Ser: 1.05 mg/dL (ref 0.40–1.20)
GFR: 69.44 mL/min (ref >60.00)
Glucose, Bld: 87 mg/dL (ref 70–99)
Potassium: 4.1 mEq/L (ref 3.5–5.1)
Sodium: 141 mEq/L (ref 135–145)
Total Bilirubin: 0.3 mg/dL (ref 0.2–1.2)
Total Protein: 7.2 g/dL (ref 6.0–8.3)

## 2018-01-10 LAB — HEMOGLOBIN A1C: Hgb A1c MFr Bld: 6.2 % (ref 4.6–6.5)

## 2018-01-10 LAB — LIPID PANEL
Cholesterol: 197 mg/dL (ref 0–200)
HDL: 49.4 mg/dL (ref >39.00)
LDL Cholesterol: 121 mg/dL — ABNORMAL HIGH (ref 0–99)
NonHDL: 147.38
Total CHOL/HDL Ratio: 4
Triglycerides: 130 mg/dL (ref 0.0–149.0)
VLDL: 26 mg/dL (ref 0.0–40.0)

## 2018-01-10 LAB — TSH: TSH: 0.54 u[IU]/mL (ref 0.35–4.50)

## 2018-01-10 NOTE — Assessment & Plan Note (Signed)
Is asking to see a hand specialist due to worsening nodules and stiffness in hands.  Right: nodules on thumb, middle finger and pinky Left: nodule on middle finger

## 2018-01-10 NOTE — Assessment & Plan Note (Signed)
hgba1c acceptable, minimize simple carbs. Increase exercise as tolerated. Continue current meds 

## 2018-01-10 NOTE — Assessment & Plan Note (Signed)
Supplement and monitor 

## 2018-01-10 NOTE — Patient Instructions (Addendum)
Shingrix is the new shingles shot, 2 shots  Hypertension Hypertension is another name for high blood pressure. High blood pressure forces your heart to work harder to pump blood. This can cause problems over time. There are two numbers in a blood pressure reading. There is a top number (systolic) over a bottom number (diastolic). It is best to have a blood pressure below 120/80. Healthy choices can help lower your blood pressure. You may need medicine to help lower your blood pressure if:  Your blood pressure cannot be lowered with healthy choices.  Your blood pressure is higher than 130/80.  Follow these instructions at home: Eating and drinking  If directed, follow the DASH eating plan. This diet includes: ? Filling half of your plate at each meal with fruits and vegetables. ? Filling one quarter of your plate at each meal with whole grains. Whole grains include whole wheat pasta, brown rice, and whole grain bread. ? Eating or drinking low-fat dairy products, such as skim milk or low-fat yogurt. ? Filling one quarter of your plate at each meal with low-fat (lean) proteins. Low-fat proteins include fish, skinless chicken, eggs, beans, and tofu. ? Avoiding fatty meat, cured and processed meat, or chicken with skin. ? Avoiding premade or processed food.  Eat less than 1,500 mg of salt (sodium) a day.  Limit alcohol use to no more than 1 drink a day for nonpregnant women and 2 drinks a day for men. One drink equals 12 oz of beer, 5 oz of wine, or 1 oz of hard liquor. Lifestyle  Work with your doctor to stay at a healthy weight or to lose weight. Ask your doctor what the best weight is for you.  Get at least 30 minutes of exercise that causes your heart to beat faster (aerobic exercise) most days of the week. This may include walking, swimming, or biking.  Get at least 30 minutes of exercise that strengthens your muscles (resistance exercise) at least 3 days a week. This may include  lifting weights or pilates.  Do not use any products that contain nicotine or tobacco. This includes cigarettes and e-cigarettes. If you need help quitting, ask your doctor.  Check your blood pressure at home as told by your doctor.  Keep all follow-up visits as told by your doctor. This is important. Medicines  Take over-the-counter and prescription medicines only as told by your doctor. Follow directions carefully.  Do not skip doses of blood pressure medicine. The medicine does not work as well if you skip doses. Skipping doses also puts you at risk for problems.  Ask your doctor about side effects or reactions to medicines that you should watch for. Contact a doctor if:  You think you are having a reaction to the medicine you are taking.  You have headaches that keep coming back (recurring).  You feel dizzy.  You have swelling in your ankles.  You have trouble with your vision. Get help right away if:  You get a very bad headache.  You start to feel confused.  You feel weak or numb.  You feel faint.  You get very bad pain in your: ? Chest. ? Belly (abdomen).  You throw up (vomit) more than once.  You have trouble breathing. Summary  Hypertension is another name for high blood pressure.  Making healthy choices can help lower blood pressure. If your blood pressure cannot be controlled with healthy choices, you may need to take medicine. This information is not intended to  replace advice given to you by your health care provider. Make sure you discuss any questions you have with your health care provider. Document Released: 09/27/2007 Document Revised: 03/08/2016 Document Reviewed: 03/08/2016 Elsevier Interactive Patient Education  [employer]. over 2-6 months

## 2018-01-10 NOTE — Assessment & Plan Note (Signed)
hgba1c acceptable, minimize simple carbs. Increase exercise as tolerated.  

## 2018-01-10 NOTE — Assessment & Plan Note (Signed)
Encouraged heart healthy diet, increase exercise, avoid trans fats, consider a krill oil cap daily 

## 2018-01-10 NOTE — Assessment & Plan Note (Signed)
Well controlled, no changes to meds. Encouraged heart healthy diet such as the DASH diet and exercise as tolerated.  °

## 2018-01-10 NOTE — Assessment & Plan Note (Signed)
Encouraged DASH diet, decrease po intake and increase exercise as tolerated. Needs 7-8 hours of sleep nightly. Avoid trans fats, eat small, frequent meals every 4-5 hours with lean proteins, complex carbs and healthy fats. Minimize simple carbs, has stopped going to healthy weight and wellness and would like to monitor here

## 2018-01-11 LAB — RHEUMATOID FACTOR: Rhuematoid fact SerPl-aCnc: <14 IU/mL (ref <14)

## 2018-01-13 NOTE — Progress Notes (Signed)
Subjective:    Patient ID: [name], female    DOB: 1961/04/14, 57 y.o.   MRN: [id number]  No chief complaint on file.   HPI Patient is in today for follow up . She feels well today. She has noted she tried to take Wellbutrin but it caused her to feel dizzy and light headed so she stopped the medication and these symptoms have resolved. Her greatest complaint is some worsening nodules and stiffness in her hands. No warmth or redness in hands. No recent febrile illness or hospitalizations. No polyuria or polydipsia. Denies CP/palp/SOB/HA/congestion/fevers/GI or GU c/o. Taking meds as prescribed  Past Medical History:  Diagnosis Date  . Acute bronchitis 05/25/2016  . Anxiety   . Chronic pain syndrome 01/06/2013  . Chronic rhinosinusitis   . Colon polyps   . Cough 01/06/2013  . Depression   . Diabetes ([place]) 01/06/2013  . Diabetes mellitus type 2 in obese [facility]) 01/06/2013   Sees Dr Calvert Cantor for eye exam Does not see podiatry, foot exam today unremarkable except for thick cracking skin on heals   . Dysuria 05/25/2016  . Edema 01/06/2013  . Epistaxis 05/25/2016  . Fibroid, uterine   . GERD (gastroesophageal reflux disease)   . Glaucoma 12-13  . Hoarseness   . Hoarseness of voice 05/11/2013  . Hyperglycemia 04/13/2013  . Hyperlipemia, mixed 06/06/2007   Qualifier: Diagnosis of  By: Wynona Luna She feels Lipitor caused increased low back pain and weakness    . Hyperlipidemia   . Hypertension   . Hypokalemia 02/05/2013  . IBS (irritable bowel syndrome) 02/13/2011  . Low back pain 03/03/2013  . Muscle cramp 05/22/2014  . Obesity   . Obesity, unspecified 05/11/2013  . OSA (obstructive sleep apnea)   . Pain in joint, shoulder region 06/27/2015  . Perimenopause 10/05/2012  . Raynaud disease 06/16/2013  . Thyroid disease    hypothyroidism  . Vocal fold nodules     Past Surgical History:  Procedure Laterality Date  . ABDOMINAL HYSTERECTOMY    . BREAST EXCISIONAL BIOPSY Right    at age 18  . CHOLECYSTECTOMY    . CYSTECTOMY    . DILATION AND CURETTAGE OF UTERUS     x2  . OOPHORECTOMY    . POLYPECTOMY    . ROTATOR CUFF REPAIR      Family History  Problem Relation Age of Onset  . Alcohol abuse Father   . Cancer Father        renal and colon  . Hyperlipidemia Father   . Hypertension Father   . Stroke Father   . Heart disease Father   . Cancer Paternal Grandfather        colon  . Diabetes Unknown   . High blood pressure Mother   . High Cholesterol Mother   . Kidney disease Mother   . Depression Mother   . Anxiety disorder Mother   . Obesity Mother   . Breast cancer Other 68    Social History   Socioeconomic History  . Marital status: Significant Other    Spouse name: Not on file  . Number of children: 0  . Years of education: Not on file  . Highest education level: Not on file  Occupational History  . Occupation: Technical sales engineer  Social Needs  . Financial resource strain: Not on file  . Food insecurity:    Worry: Not on file    Inability: Not on file  . Transportation needs:  Medical: Not on file    Non-medical: Not on file  Tobacco Use  . Smoking status: Former Smoker    Packs/day: 0.50    Years: 30.00    Pack years: 15.00    Types: Cigarettes    Last attempt to quit: 04/25/2003    Years since quitting: 14.7  . Smokeless tobacco: Never Used  . Tobacco comment: smoked since age 57  Substance and Sexual Activity  . Alcohol use: No  . Drug use: Never  . Sexual activity: Yes    Partners: Male  Lifestyle  . Physical activity:    Days per week: Not on file    Minutes per session: Not on file  . Stress: Not on file  Relationships  . Social connections:    Talks on phone: Not on file    Gets together: Not on file    Attends religious service: Not on file    Active member of club or organization: Not on file    Attends meetings of clubs or organizations: Not on file    Relationship status: Not on file  . Intimate  partner violence:    Fear of current or ex partner: Not on file    Emotionally abused: Not on file    Physically abused: Not on file    Forced sexual activity: Not on file  Other Topics Concern  . Not on file  Social History Narrative   Endo-- Dr Dwyane Dee   ENT--Dr shoemaker   GI--Dr Buccini   Pulm--Dr Clance   Rheum--Dr Charlestine Night          Outpatient Medications Prior to Visit  Medication Sig Dispense Refill  . acetaminophen (TYLENOL) 325 MG tablet Take 650 mg by mouth every 6 (six) hours as needed.    Marland Kitchen albuterol (VENTOLIN HFA) 108 (90 Base) MCG/ACT inhaler INHALE 2 PUFFS INTO THE LUNGS EVERY 4 HOURS AS NEEDED FOR WHEEZING OR SHORTNESS OF BREATH. 18 g 6  . benzonatate (TESSALON) 100 MG capsule Take 1 capsule (100 mg total) by mouth 3 (three) times daily as needed for cough. 20 capsule 0  . BYSTOLIC 20 MG TABS TAKE 1 TABLET BY MOUTH ONCE DAILY 90 tablet 2  . Cod Liver Oil 1000 MG CAPS Take 1 capsule by mouth daily.    . diphenhydrAMINE (BENADRYL) 25 MG tablet Take 25 mg by mouth every 6 (six) hours as needed.    . fenofibrate micronized (LOFIBRA) 134 MG capsule TAKE 1 CAPSULE BY MOUTH DAILY BEFORE BREAKFAST. 30 capsule 0  . furosemide (LASIX) 20 MG tablet TAKE 1 TO 2 TABLETS BY MOUTH DAILY AS NEEDED FOR FLUID OR EDEMA 40 tablet 1  . gabapentin (NEURONTIN) 300 MG capsule Take 1 capsule (300 mg total) by mouth 2 (two) times daily. 60 capsule 3  . loratadine (CLARITIN) 10 MG tablet Take 10 mg by mouth daily.    . metFORMIN (GLUCOPHAGE) 1000 MG tablet Take 1 tablet (1,000 mg total) by mouth daily with breakfast. 30 tablet 0  . modafinil (PROVIGIL) 200 MG tablet Take 2 tablets (400 mg total) by mouth daily. 60 tablet 5  . spironolactone (ALDACTONE) 50 MG tablet Take 1 tablet (50 mg total) by mouth daily. 30 tablet 5  . SYNTHROID 100 MCG tablet Take 1 tablet (100 mcg total) by mouth daily before breakfast. 90 tablet 1  . Vitamin D, Ergocalciferol, (DRISDOL) 50000 units CAPS capsule TAKE 1  CAPSULE BY MOUTH EVERY 7 DAYS 12 capsule 1  . buPROPion (WELLBUTRIN SR) 200  MG 12 hr tablet Take 1 tablet (200 mg total) by mouth daily at 12 noon. 30 tablet 0   No facility-administered medications prior to visit.     Allergies  Allergen Reactions  . Losartan Cough  . Wellbutrin [Bupropion] Other (See Comments)    Dizziness and mental status changes  . Aspirin Other (See Comments)    GI upset REACTION: Upset stomach  . Atorvastatin     myalgias  . Codeine   . Amoxicillin Rash  . Erythromycin Rash  . Penicillins Rash    Review of Systems  Constitutional: Negative for fever and malaise/fatigue.  HENT: Negative for congestion.   Eyes: Negative for blurred vision.  Respiratory: Negative for shortness of breath.   Cardiovascular: Negative for chest pain, palpitations and leg swelling.  Gastrointestinal: Negative for abdominal pain, blood in stool and nausea.  Genitourinary: Negative for dysuria and frequency.  Musculoskeletal: Positive for joint pain. Negative for falls.  Skin: Negative for rash.  Neurological: Negative for dizziness, loss of consciousness and headaches.  Endo/Heme/Allergies: Negative for environmental allergies.  Psychiatric/Behavioral: Negative for depression. The patient is not nervous/anxious.        Objective:    Physical Exam  Constitutional: She is oriented to person, place, and time. She appears well-developed and well-nourished. No distress.  HENT:  Head: Normocephalic and atraumatic.  Nose: Nose normal.  Eyes: Right eye exhibits no discharge. Left eye exhibits no discharge.  Neck: Normal range of motion. Neck supple.  Cardiovascular: Normal rate and regular rhythm.  No murmur heard. Pulmonary/Chest: Effort normal and breath sounds normal.  Abdominal: Soft. Bowel sounds are normal. There is no tenderness.  Musculoskeletal: She exhibits no edema.  Neurological: She is alert and oriented to person, place, and time.  Skin: Skin is warm and dry.    Nodules noted on DIP joints of numerous fingers, no erythema noted.   Psychiatric: She has a normal mood and affect.  Nursing note and vitals reviewed.   BP 136/64 (BP Location: Left Arm, Patient Position: Sitting, Cuff Size: Normal)   Pulse 70   Temp 98.5 F (36.9 C) (Oral)   Resp 18   Ht 5\' 4"  (1.626 m)   Wt 237 lb 12.8 oz (107.9 kg)   SpO2 100%   BMI 40.82 kg/m  Wt Readings from Last 3 Encounters:  01/10/18 237 lb 12.8 oz (107.9 kg)  10/08/17 241 lb (109.3 kg)  09/27/17 237 lb (107.5 kg)     Lab Results  Component Value Date   WBC 4.3 01/10/2018   HGB 13.8 01/10/2018   HCT 41.6 01/10/2018   PLT 367.0 01/10/2018   GLUCOSE 87 01/10/2018   CHOL 197 01/10/2018   TRIG 130.0 01/10/2018   HDL 49.40 01/10/2018   LDLDIRECT 103.0 07/11/2016   LDLCALC 121 (H) 01/10/2018   ALT 18 01/10/2018   AST 17 01/10/2018   NA 141 01/10/2018   K 4.1 01/10/2018   CL 106 01/10/2018   CREATININE 1.05 01/10/2018   BUN 14 01/10/2018   CO2 26 01/10/2018   TSH 0.54 01/10/2018   HGBA1C 6.2 01/10/2018   MICROALBUR 0.7 10/11/2016    Lab Results  Component Value Date   TSH 0.54 01/10/2018   Lab Results  Component Value Date   WBC 4.3 01/10/2018   HGB 13.8 01/10/2018   HCT 41.6 01/10/2018   MCV 89.7 01/10/2018   PLT 367.0 01/10/2018   Lab Results  Component Value Date   NA 141 01/10/2018   K 4.1  01/10/2018   CO2 26 01/10/2018   GLUCOSE 87 01/10/2018   BUN 14 01/10/2018   CREATININE 1.05 01/10/2018   BILITOT 0.3 01/10/2018   ALKPHOS 51 01/10/2018   AST 17 01/10/2018   ALT 18 01/10/2018   PROT 7.2 01/10/2018   ALBUMIN 4.4 01/10/2018   CALCIUM 9.7 01/10/2018   GFR 69.44 01/10/2018   Lab Results  Component Value Date   CHOL 197 01/10/2018   Lab Results  Component Value Date   HDL 49.40 01/10/2018   Lab Results  Component Value Date   LDLCALC 121 (H) 01/10/2018   Lab Results  Component Value Date   TRIG 130.0 01/10/2018   Lab Results  Component Value Date    CHOLHDL 4 01/10/2018   Lab Results  Component Value Date   HGBA1C 6.2 01/10/2018       Assessment & Plan:   Problem List Items Addressed This Visit    Hyperlipemia, mixed    Encouraged heart healthy diet, increase exercise, avoid trans fats, consider a krill oil cap daily      Relevant Orders   Lipid panel (Completed)   Essential hypertension    Well controlled, no changes to meds. Encouraged heart healthy diet such as the DASH diet and exercise as tolerated.       Relevant Orders   CBC (Completed)   Comprehensive metabolic panel (Completed)   TSH (Completed)   Vitamin D deficiency    Supplement and monitor       Diabetes mellitus type 2 in obese (HCC)    hgba1c acceptable, minimize simple carbs. Increase exercise as tolerated. Continue current meds      Relevant Orders   Hemoglobin A1c (Completed)   Morbid obesity ([place])    Encouraged DASH diet, decrease po intake and increase exercise as tolerated. Needs 7-8 hours of sleep nightly. Avoid trans fats, eat small, frequent meals every 4-5 hours with lean proteins, complex carbs and healthy fats. Minimize simple carbs, has stopped going to healthy weight and wellness and would like to monitor here      Prediabetes    hgba1c acceptable, minimize simple carbs. Increase exercise as tolerated.      Nodule of finger of both hands    Is asking to see a hand specialist due to worsening nodules and stiffness in hands.  Right: nodules on thumb, middle finger and pinky Left: nodule on middle finger      Relevant Orders   Rheumatoid Factor (Completed)   Ambulatory referral to Hand Surgery    Other Visit Diagnoses    Needs flu shot    -  Primary   Relevant Orders   Flu Vaccine QUAD 6+ mos PF IM (Fluarix Quad PF) (Completed)   Need for shingles vaccine       Relevant Orders   Varicella-zoster vaccine IM (Shingrix) (Completed)      I have discontinued Isyss S. Broadus's buPROPion. I am also having her maintain her  spironolactone, BYSTOLIC, albuterol, Cod Liver Oil, loratadine, acetaminophen, diphenhydrAMINE, benzonatate, modafinil, metFORMIN, fenofibrate micronized, Vitamin D (Ergocalciferol), gabapentin, SYNTHROID, and furosemide.  No orders of the defined types were placed in this encounter.    Penni Homans, MD

## 2018-01-21 ENCOUNTER — Other Ambulatory Visit: Payer: Self-pay | Admitting: Family Medicine

## 2018-01-21 MED FILL — FENOFIBRATE 134 MG CAPSULE: 134 | 30 days supply | Qty: 30 | Fill #0

## 2018-01-30 ENCOUNTER — Other Ambulatory Visit: Payer: Self-pay | Admitting: Family Medicine

## 2018-01-30 DIAGNOSIS — M19041 Primary osteoarthritis, right hand: Secondary | ICD-10-CM | POA: Diagnosis not present

## 2018-01-30 DIAGNOSIS — M79641 Pain in right hand: Secondary | ICD-10-CM | POA: Diagnosis not present

## 2018-01-30 DIAGNOSIS — M79642 Pain in left hand: Secondary | ICD-10-CM | POA: Diagnosis not present

## 2018-01-30 DIAGNOSIS — M6740 Ganglion, unspecified site: Secondary | ICD-10-CM | POA: Diagnosis not present

## 2018-01-30 DIAGNOSIS — G5603 Carpal tunnel syndrome, bilateral upper limbs: Secondary | ICD-10-CM | POA: Diagnosis not present

## 2018-01-31 ENCOUNTER — Encounter: Payer: Self-pay | Admitting: Neurology

## 2018-01-31 ENCOUNTER — Other Ambulatory Visit: Payer: Self-pay | Admitting: *Deleted

## 2018-01-31 DIAGNOSIS — G5603 Carpal tunnel syndrome, bilateral upper limbs: Secondary | ICD-10-CM

## 2018-01-31 MED FILL — FUROSEMIDE 20 MG TABS: 20 | 20 days supply | Qty: 40 | Fill #0

## 2018-02-01 ENCOUNTER — Other Ambulatory Visit: Payer: Self-pay | Admitting: Family Medicine

## 2018-02-01 MED FILL — BYSTOLIC 20 MG TABLET: 20 | 90 days supply | Qty: 90 | Fill #0

## 2018-02-14 DIAGNOSIS — M5136 Other intervertebral disc degeneration, lumbar region: Secondary | ICD-10-CM | POA: Diagnosis not present

## 2018-02-14 DIAGNOSIS — M9903 Segmental and somatic dysfunction of lumbar region: Secondary | ICD-10-CM | POA: Diagnosis not present

## 2018-02-14 DIAGNOSIS — M9902 Segmental and somatic dysfunction of thoracic region: Secondary | ICD-10-CM | POA: Diagnosis not present

## 2018-02-14 DIAGNOSIS — M9905 Segmental and somatic dysfunction of pelvic region: Secondary | ICD-10-CM | POA: Diagnosis not present

## 2018-02-14 DIAGNOSIS — M62830 Muscle spasm of back: Secondary | ICD-10-CM | POA: Diagnosis not present

## 2018-02-14 DIAGNOSIS — M9901 Segmental and somatic dysfunction of cervical region: Secondary | ICD-10-CM | POA: Diagnosis not present

## 2018-02-19 ENCOUNTER — Other Ambulatory Visit: Payer: Self-pay | Admitting: Family Medicine

## 2018-02-20 MED FILL — SPIRONOLACTONE 25 MG TABLET: 25 | 30 days supply | Qty: 60 | Fill #0

## 2018-02-27 MED FILL — FUROSEMIDE 20 MG TABS: 20 | 20 days supply | Qty: 40 | Fill #1

## 2018-02-27 MED FILL — SYNTHROID 100 MCG TABLET: 100 | 90 days supply | Qty: 90 | Fill #1

## 2018-02-28 ENCOUNTER — Ambulatory Visit (INDEPENDENT_AMBULATORY_CARE_PROVIDER_SITE_OTHER): Payer: 59 | Admitting: Neurology

## 2018-02-28 DIAGNOSIS — I10 Essential (primary) hypertension: Secondary | ICD-10-CM | POA: Diagnosis not present

## 2018-02-28 DIAGNOSIS — G4733 Obstructive sleep apnea (adult) (pediatric): Secondary | ICD-10-CM | POA: Diagnosis not present

## 2018-02-28 DIAGNOSIS — G5601 Carpal tunnel syndrome, right upper limb: Secondary | ICD-10-CM | POA: Diagnosis not present

## 2018-02-28 NOTE — Procedures (Signed)
[facility]  [address], [address]  [place], [place] [address] Tel: [phone] Fax:  [phone] Test Date:  02/28/2018  Patient: [name] DOB: 28-Feb-1961 Physician: Narda Amber, DO  Sex: Female Height: 5\' 4"  Ref Phys: Daryll Brod, MD  ID#: [id number] Temp: 35.0C Technician:    Patient Complaints: This is a 57 year-old female referred for evaluation of bilateral hand paresthesias, worse on the right.  NCV & EMG Findings: Extensive electrodiagnostic testing of the right upper extremity and additional studies of the left shows: 1. Right median sensory nerve showed prolonged distal peak latency (4.0 ms).  Left median, left mixed palmer, and bilateral ulnar sensory responses are within normal limits. 2. Bilateral median and ulnar motor responses are within normal limits. 3. There is no evidence of active or chronic motor axon loss changes affecting any of the tested muscles.  Motor unit recruitment and configuration is within normal limits.  Impression: Right median neuropathy at or distal to the wrist, consistent with a clinical diagnosis of carpal tunnel syndrome.  Overall, these findings are mild in degree electrically.   ___________________________ Narda Amber, DO    Nerve Conduction Studies Anti Sensory Summary Table   Site NR Peak (ms) Norm Peak (ms) P-T Amp (V) Norm P-T Amp  Left Median Anti Sensory (2nd Digit)  35C  Wrist    3.4 <3.6 53.7 >15  Right Median Anti Sensory (2nd Digit)  35C  Wrist    4.0 <3.6 26.9 >15  Left Ulnar Anti Sensory (5th Digit)  35C  Wrist    2.9 <3.1 48.7 >10  Right Ulnar Anti Sensory (5th Digit)  35C  Wrist    2.6 <3.1 43.2 >10   Motor Summary Table   Site NR Onset (ms) Norm Onset (ms) O-P Amp (mV) Norm O-P Amp Site1 Site2 Delta-0 (ms) Dist (cm) Vel (m/s) Norm Vel (m/s)  Left Median Motor (Abd Poll Brev)  35C  Wrist    3.3 <4.0 7.5 >6 Elbow Wrist 4.8 28.0 58 >50  Elbow    8.1  7.0         Right Median Motor (Abd  Poll Brev)  35C  Wrist    3.8 <4.0 9.0 >6 Elbow Wrist 4.6 27.0 59 >50  Elbow    8.4  8.7         Left Ulnar Motor (Abd Dig Minimi)  35C  Wrist    2.5 <3.1 11.6 >7 B Elbow Wrist 3.8 23.0 61 >50  B Elbow    6.3  10.3  A Elbow B Elbow 1.8 10.0 56 >50  A Elbow    8.1  10.2         Right Ulnar Motor (Abd Dig Minimi)  35C  Wrist    2.3 <3.1 9.7 >7 B Elbow Wrist 3.8 23.0 61 >50  B Elbow    6.1  9.3  A Elbow B Elbow 1.7 10.0 59 >50  A Elbow    7.8  8.7          Comparison Summary Table   Site NR Peak (ms) Norm Peak (ms) P-T Amp (V) Site1 Site2 Delta-P (ms) Norm Delta (ms)  Left Median/Ulnar Palm Comparison (Wrist - 8cm)  35C  Median Palm    2.0 <2.2 64.7 Median Palm Ulnar Palm 0.2   Ulnar Palm    1.8 <2.2 11.5       EMG   Side Muscle Ins Act Fibs Psw Fasc Number Recrt Dur Dur. Amp Amp.  Poly Poly. Comment  Right 1stDorInt Nml Nml Nml Nml Nml Nml Nml Nml Nml Nml Nml Nml N/A  Right Abd Poll Brev Nml Nml Nml Nml Nml Nml Nml Nml Nml Nml Nml Nml N/A  Right PronatorTeres Nml Nml Nml Nml Nml Nml Nml Nml Nml Nml Nml Nml N/A  Right Biceps Nml Nml Nml Nml Nml Nml Nml Nml Nml Nml Nml Nml N/A  Right Triceps Nml Nml Nml Nml Nml Nml Nml Nml Nml Nml Nml Nml N/A  Right Deltoid Nml Nml Nml Nml Nml Nml Nml Nml Nml Nml Nml Nml N/A  Left 1stDorInt Nml Nml Nml Nml Nml Nml Nml Nml Nml Nml Nml Nml N/A  Left PronatorTeres Nml Nml Nml Nml Nml Nml Nml Nml Nml Nml Nml Nml N/A  Left Biceps Nml Nml Nml Nml Nml Nml Nml Nml Nml Nml Nml Nml N/A  Left Triceps Nml Nml Nml Nml Nml Nml Nml Nml Nml Nml Nml Nml N/A  Left Deltoid Nml Nml Nml Nml Nml Nml Nml Nml Nml Nml Nml Nml N/A      Waveforms:

## 2018-03-05 ENCOUNTER — Other Ambulatory Visit: Payer: Self-pay | Admitting: Pulmonary Disease

## 2018-03-05 ENCOUNTER — Encounter: Payer: Self-pay | Admitting: Family Medicine

## 2018-03-05 NOTE — Telephone Encounter (Signed)
Pt requesting a refill on Provigil 400mg  qhs.  Last refill: 08/06/17 #60 with 5 refills Last OV: 08/06/17 Next OV: none, recall for 07/2018 in chart.    VS please advise if ok to refill.  Thanks!

## 2018-03-06 ENCOUNTER — Other Ambulatory Visit: Payer: Self-pay | Admitting: Orthopedic Surgery

## 2018-03-06 ENCOUNTER — Other Ambulatory Visit: Payer: Self-pay

## 2018-03-06 DIAGNOSIS — G5601 Carpal tunnel syndrome, right upper limb: Secondary | ICD-10-CM | POA: Diagnosis not present

## 2018-03-06 DIAGNOSIS — M19042 Primary osteoarthritis, left hand: Secondary | ICD-10-CM | POA: Diagnosis not present

## 2018-03-06 DIAGNOSIS — M6740 Ganglion, unspecified site: Secondary | ICD-10-CM | POA: Diagnosis not present

## 2018-03-06 MED ORDER — MODAFINIL 200 MG PO TABS: 400.0000 mg | ORAL_TABLET | Freq: Every day | ORAL | 5 refills | Status: DC

## 2018-03-06 MED FILL — FENOFIBRATE 134 MG CAPSULE: 134 | 30 days supply | Qty: 30 | Fill #1

## 2018-03-06 NOTE — Telephone Encounter (Signed)
Dr. Halford Chessman, please advise on patients e-mail below. Thank you.

## 2018-03-06 NOTE — Telephone Encounter (Signed)
Prescription is upfront and patient notified nothing further needed at this time.

## 2018-03-06 NOTE — Telephone Encounter (Signed)
Script printed and signed

## 2018-03-11 ENCOUNTER — Other Ambulatory Visit: Payer: Self-pay | Admitting: Pulmonary Disease

## 2018-03-11 MED FILL — MODAFINIL 200 MG TABLET: 200 | 30 days supply | Qty: 60 | Fill #0

## 2018-03-12 ENCOUNTER — Encounter: Payer: Self-pay | Admitting: Family Medicine

## 2018-03-12 MED FILL — VIT D2 1.25 MG (50,000 UNIT: 1.25 MG | 84 days supply | Qty: 12 | Fill #0

## 2018-03-28 MED FILL — SPIRONOLACTONE 25 MG TABLET: 25 | 30 days supply | Qty: 60 | Fill #1

## 2018-03-29 ENCOUNTER — Other Ambulatory Visit: Payer: Self-pay

## 2018-03-29 MED ORDER — FUROSEMIDE 20 MG PO TABS: ORAL_TABLET | ORAL | 1 refills | Status: DC

## 2018-03-29 MED FILL — FUROSEMIDE 20 MG TABS: 20 | 20 days supply | Qty: 40 | Fill #0

## 2018-04-02 ENCOUNTER — Other Ambulatory Visit: Payer: Self-pay

## 2018-04-02 ENCOUNTER — Encounter (HOSPITAL_BASED_OUTPATIENT_CLINIC_OR_DEPARTMENT_OTHER): Payer: Self-pay | Admitting: *Deleted

## 2018-04-04 ENCOUNTER — Encounter (HOSPITAL_BASED_OUTPATIENT_CLINIC_OR_DEPARTMENT_OTHER)
Admission: RE | Admit: 2018-04-04 | Discharge: 2018-04-04 | Disposition: A | Discharge status: Home / Self Care | Payer: 59 | Source: Ambulatory Visit | Attending: Orthopedic Surgery | Admitting: Orthopedic Surgery

## 2018-04-04 DIAGNOSIS — M9901 Segmental and somatic dysfunction of cervical region: Secondary | ICD-10-CM | POA: Diagnosis not present

## 2018-04-04 DIAGNOSIS — Z01812 Encounter for preprocedural laboratory examination: Secondary | ICD-10-CM | POA: Diagnosis not present

## 2018-04-04 DIAGNOSIS — M9903 Segmental and somatic dysfunction of lumbar region: Secondary | ICD-10-CM | POA: Diagnosis not present

## 2018-04-04 DIAGNOSIS — M9902 Segmental and somatic dysfunction of thoracic region: Secondary | ICD-10-CM | POA: Diagnosis not present

## 2018-04-04 DIAGNOSIS — M9905 Segmental and somatic dysfunction of pelvic region: Secondary | ICD-10-CM | POA: Diagnosis not present

## 2018-04-04 DIAGNOSIS — M62830 Muscle spasm of back: Secondary | ICD-10-CM | POA: Diagnosis not present

## 2018-04-04 DIAGNOSIS — M5136 Other intervertebral disc degeneration, lumbar region: Secondary | ICD-10-CM | POA: Diagnosis not present

## 2018-04-04 LAB — BASIC METABOLIC PANEL
Anion gap: 10 (ref 5–15)
BUN: 11 mg/dL (ref 6–20)
CO2: 25 mmol/L (ref 22–32)
Calcium: 10.2 mg/dL (ref 8.9–10.3)
Chloride: 106 mmol/L (ref 98–111)
Creatinine, Ser: 1.11 mg/dL — ABNORMAL HIGH (ref 0.44–1.00)
GFR calc Af Amer: >60 mL/min (ref >60)
GFR calc non Af Amer: 55 mL/min — ABNORMAL LOW (ref >60)
Glucose, Bld: 84 mg/dL (ref 70–99)
Potassium: 4.3 mmol/L (ref 3.5–5.1)
Sodium: 141 mmol/L (ref 135–145)

## 2018-04-11 ENCOUNTER — Ambulatory Visit (HOSPITAL_BASED_OUTPATIENT_CLINIC_OR_DEPARTMENT_OTHER): Payer: 59 | Admitting: Anesthesiology

## 2018-04-11 ENCOUNTER — Encounter (HOSPITAL_BASED_OUTPATIENT_CLINIC_OR_DEPARTMENT_OTHER)
Admission: RE | Disposition: A | Discharge status: Home / Self Care | Payer: Self-pay | Source: Home / Self Care | Attending: Orthopedic Surgery

## 2018-04-11 ENCOUNTER — Encounter (HOSPITAL_BASED_OUTPATIENT_CLINIC_OR_DEPARTMENT_OTHER): Payer: Self-pay | Admitting: Anesthesiology

## 2018-04-11 ENCOUNTER — Ambulatory Visit (HOSPITAL_BASED_OUTPATIENT_CLINIC_OR_DEPARTMENT_OTHER)
Admission: RE | Admit: 2018-04-11 | Discharge: 2018-04-11 | Disposition: A | Discharge status: Home / Self Care | Payer: 59 | Attending: Orthopedic Surgery | Admitting: Orthopedic Surgery

## 2018-04-11 ENCOUNTER — Other Ambulatory Visit: Payer: Self-pay

## 2018-04-11 DIAGNOSIS — F329 Major depressive disorder, single episode, unspecified: Secondary | ICD-10-CM | POA: Diagnosis not present

## 2018-04-11 DIAGNOSIS — K589 Irritable bowel syndrome without diarrhea: Secondary | ICD-10-CM | POA: Insufficient documentation

## 2018-04-11 DIAGNOSIS — E785 Hyperlipidemia, unspecified: Secondary | ICD-10-CM | POA: Insufficient documentation

## 2018-04-11 DIAGNOSIS — E039 Hypothyroidism, unspecified: Secondary | ICD-10-CM | POA: Insufficient documentation

## 2018-04-11 DIAGNOSIS — I10 Essential (primary) hypertension: Secondary | ICD-10-CM | POA: Insufficient documentation

## 2018-04-11 DIAGNOSIS — Z818 Family history of other mental and behavioral disorders: Secondary | ICD-10-CM | POA: Insufficient documentation

## 2018-04-11 DIAGNOSIS — H409 Unspecified glaucoma: Secondary | ICD-10-CM | POA: Diagnosis not present

## 2018-04-11 DIAGNOSIS — Z811 Family history of alcohol abuse and dependence: Secondary | ICD-10-CM | POA: Insufficient documentation

## 2018-04-11 DIAGNOSIS — M19042 Primary osteoarthritis, left hand: Secondary | ICD-10-CM | POA: Insufficient documentation

## 2018-04-11 DIAGNOSIS — G4733 Obstructive sleep apnea (adult) (pediatric): Secondary | ICD-10-CM | POA: Insufficient documentation

## 2018-04-11 DIAGNOSIS — Z6841 Body Mass Index (BMI) 40.0 and over, adult: Secondary | ICD-10-CM | POA: Insufficient documentation

## 2018-04-11 DIAGNOSIS — Z87891 Personal history of nicotine dependence: Secondary | ICD-10-CM | POA: Insufficient documentation

## 2018-04-11 DIAGNOSIS — E669 Obesity, unspecified: Secondary | ICD-10-CM | POA: Diagnosis not present

## 2018-04-11 DIAGNOSIS — G894 Chronic pain syndrome: Secondary | ICD-10-CM | POA: Diagnosis not present

## 2018-04-11 DIAGNOSIS — Z885 Allergy status to narcotic agent status: Secondary | ICD-10-CM | POA: Insufficient documentation

## 2018-04-11 DIAGNOSIS — G5601 Carpal tunnel syndrome, right upper limb: Secondary | ICD-10-CM | POA: Diagnosis not present

## 2018-04-11 DIAGNOSIS — E119 Type 2 diabetes mellitus without complications: Secondary | ICD-10-CM | POA: Insufficient documentation

## 2018-04-11 DIAGNOSIS — Z800 Family history of malignant neoplasm of digestive organs: Secondary | ICD-10-CM | POA: Insufficient documentation

## 2018-04-11 DIAGNOSIS — M7989 Other specified soft tissue disorders: Secondary | ICD-10-CM | POA: Diagnosis not present

## 2018-04-11 DIAGNOSIS — L728 Other follicular cysts of the skin and subcutaneous tissue: Secondary | ICD-10-CM | POA: Diagnosis not present

## 2018-04-11 DIAGNOSIS — K635 Polyp of colon: Secondary | ICD-10-CM | POA: Insufficient documentation

## 2018-04-11 DIAGNOSIS — K219 Gastro-esophageal reflux disease without esophagitis: Secondary | ICD-10-CM | POA: Insufficient documentation

## 2018-04-11 DIAGNOSIS — Z886 Allergy status to analgesic agent status: Secondary | ICD-10-CM | POA: Insufficient documentation

## 2018-04-11 DIAGNOSIS — Z881 Allergy status to other antibiotic agents status: Secondary | ICD-10-CM | POA: Insufficient documentation

## 2018-04-11 DIAGNOSIS — M25742 Osteophyte, left hand: Secondary | ICD-10-CM | POA: Insufficient documentation

## 2018-04-11 DIAGNOSIS — Z803 Family history of malignant neoplasm of breast: Secondary | ICD-10-CM | POA: Insufficient documentation

## 2018-04-11 DIAGNOSIS — Z833 Family history of diabetes mellitus: Secondary | ICD-10-CM | POA: Diagnosis not present

## 2018-04-11 DIAGNOSIS — Z90710 Acquired absence of both cervix and uterus: Secondary | ICD-10-CM | POA: Insufficient documentation

## 2018-04-11 DIAGNOSIS — F419 Anxiety disorder, unspecified: Secondary | ICD-10-CM | POA: Diagnosis not present

## 2018-04-11 DIAGNOSIS — Z8249 Family history of ischemic heart disease and other diseases of the circulatory system: Secondary | ICD-10-CM | POA: Insufficient documentation

## 2018-04-11 DIAGNOSIS — Z888 Allergy status to other drugs, medicaments and biological substances status: Secondary | ICD-10-CM | POA: Insufficient documentation

## 2018-04-11 DIAGNOSIS — Z9049 Acquired absence of other specified parts of digestive tract: Secondary | ICD-10-CM | POA: Insufficient documentation

## 2018-04-11 DIAGNOSIS — I7300 Raynaud's syndrome without gangrene: Secondary | ICD-10-CM | POA: Insufficient documentation

## 2018-04-11 HISTORY — PX: MASS EXCISION: SHX2000

## 2018-04-11 HISTORY — PX: I & D EXTREMITY: SHX5045

## 2018-04-11 LAB — GLUCOSE, CAPILLARY: Glucose-Capillary: 84 mg/dL (ref 70–99)

## 2018-04-11 SURGERY — EXCISION MASS
Anesthesia: Monitor Anesthesia Care | Site: Hand | Laterality: Left

## 2018-04-11 MED ORDER — VANCOMYCIN HCL IN DEXTROSE 1-5 GM/200ML-% IV SOLN
1000.0000 mg | INTRAVENOUS | Status: AC
  Administered 2018-04-11: 1000 mg via INTRAVENOUS

## 2018-04-11 MED ORDER — VANCOMYCIN HCL IN DEXTROSE 1-5 GM/200ML-% IV SOLN
INTRAVENOUS | Status: AC
  Filled 2018-04-11: qty 200

## 2018-04-11 MED ORDER — LACTATED RINGERS IV SOLN
INTRAVENOUS | Status: DC
  Administered 2018-04-11: 08:00:00 via INTRAVENOUS

## 2018-04-11 MED ORDER — FENTANYL CITRATE (PF) 100 MCG/2ML IJ SOLN
50.0000 ug | INTRAMUSCULAR | Status: DC | PRN
  Administered 2018-04-11 (×2): 50 ug via INTRAVENOUS

## 2018-04-11 MED ORDER — MIDAZOLAM HCL 2 MG/2ML IJ SOLN
1.0000 mg | INTRAMUSCULAR | Status: DC | PRN
  Administered 2018-04-11 (×2): 1 mg via INTRAVENOUS

## 2018-04-11 MED ORDER — FENTANYL CITRATE (PF) 100 MCG/2ML IJ SOLN: 25.0000 ug | INTRAMUSCULAR | Status: DC | PRN

## 2018-04-11 MED ORDER — MIDAZOLAM HCL 2 MG/2ML IJ SOLN
INTRAMUSCULAR | Status: AC
  Filled 2018-04-11: qty 2

## 2018-04-11 MED ORDER — BUPIVACAINE HCL (PF) 0.25 % IJ SOLN
INTRAMUSCULAR | Status: DC | PRN
  Administered 2018-04-11: 7 mL

## 2018-04-11 MED ORDER — SCOPOLAMINE 1 MG/3DAYS TD PT72: 1.0000 | MEDICATED_PATCH | Freq: Once | TRANSDERMAL | Status: DC | PRN

## 2018-04-11 MED ORDER — CHLORHEXIDINE GLUCONATE 4 % EX LIQD: 60.0000 mL | Freq: Once | CUTANEOUS | Status: DC

## 2018-04-11 MED ORDER — TRAMADOL HCL 50 MG PO TABS: 50.0000 mg | ORAL_TABLET | Freq: Four times a day (QID) | ORAL | 0 refills | Status: DC | PRN

## 2018-04-11 MED ORDER — FENTANYL CITRATE (PF) 100 MCG/2ML IJ SOLN
INTRAMUSCULAR | Status: AC
  Filled 2018-04-11: qty 2

## 2018-04-11 MED ORDER — LIDOCAINE HCL (PF) 0.5 % IJ SOLN
INTRAMUSCULAR | Status: DC | PRN
  Administered 2018-04-11: 30 mL via INTRAVENOUS

## 2018-04-11 MED ORDER — ONDANSETRON HCL 4 MG/2ML IJ SOLN
INTRAMUSCULAR | Status: DC | PRN
  Administered 2018-04-11: 4 mg via INTRAVENOUS

## 2018-04-11 MED ORDER — PROPOFOL 10 MG/ML IV BOLUS
INTRAVENOUS | Status: DC | PRN
  Administered 2018-04-11 (×2): 20 mg via INTRAVENOUS

## 2018-04-11 MED ORDER — BUPIVACAINE HCL (PF) 0.25 % IJ SOLN
INTRAMUSCULAR | Status: AC
  Filled 2018-04-11: qty 90

## 2018-04-11 SURGICAL SUPPLY — 36 items
BLADE SURG 15 STRL LF DISP TIS (BLADE) ×1 IMPLANT
BLADE SURG 15 STRL SS (BLADE) ×2
BNDG COHESIVE 1X5 TAN STRL LF (GAUZE/BANDAGES/DRESSINGS) ×1 IMPLANT
CHLORAPREP W/TINT 26ML (MISCELLANEOUS) ×2 IMPLANT
CORD BIPOLAR FORCEPS 12FT (ELECTRODE) ×2 IMPLANT
COVER BACK TABLE 60X90IN (DRAPES) ×2 IMPLANT
COVER MAYO STAND STRL (DRAPES) ×2 IMPLANT
CUFF TOURNIQUET SINGLE 18IN (TOURNIQUET CUFF) ×1 IMPLANT
DRAPE EXTREMITY T 121X128X90 (DRAPE) ×2 IMPLANT
DRAPE SURG 17X23 STRL (DRAPES) ×2 IMPLANT
GAUZE SPONGE 4X4 12PLY STRL (GAUZE/BANDAGES/DRESSINGS) ×2 IMPLANT
GAUZE XEROFORM 1X8 LF (GAUZE/BANDAGES/DRESSINGS) ×2 IMPLANT
GLOVE BIO SURGEON STRL SZ 6.5 (GLOVE) ×1 IMPLANT
GLOVE BIOGEL PI IND STRL 7.0 (GLOVE) IMPLANT
GLOVE BIOGEL PI IND STRL 8 (GLOVE) ×1 IMPLANT
GLOVE BIOGEL PI IND STRL 8.5 (GLOVE) ×1 IMPLANT
GLOVE BIOGEL PI INDICATOR 7.0 (GLOVE) ×1
GLOVE BIOGEL PI INDICATOR 8 (GLOVE) ×1
GLOVE BIOGEL PI INDICATOR 8.5 (GLOVE) ×1
GLOVE SURG ORTHO 8.0 STRL STRW (GLOVE) ×2 IMPLANT
GOWN STRL REUS W/ TWL LRG LVL3 (GOWN DISPOSABLE) ×1 IMPLANT
GOWN STRL REUS W/TWL LRG LVL3 (GOWN DISPOSABLE) ×2
GOWN STRL REUS W/TWL XL LVL3 (GOWN DISPOSABLE) ×2 IMPLANT
NDL PRECISIONGLIDE 27X1.5 (NEEDLE) ×1 IMPLANT
NEEDLE PRECISIONGLIDE 27X1.5 (NEEDLE) ×2 IMPLANT
NS IRRIG 1000ML POUR BTL (IV SOLUTION) ×2 IMPLANT
PACK BASIN DAY SURGERY FS (CUSTOM PROCEDURE TRAY) ×2 IMPLANT
PADDING CAST ABS 4INX4YD NS (CAST SUPPLIES) ×1
PADDING CAST ABS COTTON 4X4 ST (CAST SUPPLIES) ×1 IMPLANT
SPLINT FINGER 3.25 911903 (SOFTGOODS) ×1 IMPLANT
STOCKINETTE 4X48 STRL (DRAPES) ×2 IMPLANT
SUT ETHILON 4 0 PS 2 18 (SUTURE) ×2 IMPLANT
SYR BULB 3OZ (MISCELLANEOUS) ×2 IMPLANT
SYR CONTROL 10ML LL (SYRINGE) ×2 IMPLANT
TOWEL GREEN STERILE FF (TOWEL DISPOSABLE) ×4 IMPLANT
UNDERPAD 30X30 (UNDERPADS AND DIAPERS) ×2 IMPLANT

## 2018-04-11 NOTE — Anesthesia Preprocedure Evaluation (Addendum)
Anesthesia Evaluation  Patient identified by MRN, date of birth, ID band Patient awake    Reviewed: Allergy & Precautions, NPO status , Patient's Chart, lab work & pertinent test results  Airway Mallampati: II  TM Distance: >3 FB Neck ROM: Full    Dental  (+) Dental Advisory Given   Pulmonary sleep apnea , former smoker,    breath sounds clear to auscultation       Cardiovascular hypertension, Pt. on medications  Rhythm:Regular Rate:Normal     Neuro/Psych Anxiety Depression  Neuromuscular disease    GI/Hepatic Neg liver ROS, GERD  ,  Endo/Other  diabetes, Type 2Hypothyroidism   Renal/GU negative Renal ROS     Musculoskeletal   Abdominal   Peds  Hematology negative hematology ROS (+)   Anesthesia Other Findings   Reproductive/Obstetrics                             Anesthesia Physical Anesthesia Plan  ASA: II  Anesthesia Plan: MAC and Bier Block and Bier Block-LIDOCAINE ONLY   Post-op Pain Management:    Induction: Intravenous  PONV Risk Score and Plan: 2 and Ondansetron, Propofol infusion and Treatment may vary due to age or medical condition  Airway Management Planned: Natural Airway  Additional Equipment:   Intra-op Plan:   Post-operative Plan:   Informed Consent: I have reviewed the patients History and Physical, chart, labs and discussed the procedure including the risks, benefits and alternatives for the proposed anesthesia with the patient or authorized representative who has indicated his/her understanding and acceptance.     Plan Discussed with: CRNA  Anesthesia Plan Comments:         Anesthesia Quick Evaluation

## 2018-04-11 NOTE — Brief Op Note (Signed)
04/11/2018  9:01 AM  PATIENT:  [name]  57 y.o. female  PRE-OPERATIVE DIAGNOSIS:  MUCOID TUMOR,LEFT MIDDLE FINGER  POST-OPERATIVE DIAGNOSIS:  MUCOID TUMOR,LEFT MIDDLE FINGER  PROCEDURE:  Procedure(s): EXCISION MASS (Left) DEBIDMENT DISTAL INTERPHALANGEAL LEFT MIDDLE (Left)  SURGEON:  Surgeon(s) and Role:    * Daryll Brod, MD - Primary  PHYSICIAN ASSISTANT:   ASSISTANTS: none   ANESTHESIA:   local, regional and IV sedation  EBL:  2 mL   BLOOD ADMINISTERED:none  DRAINS: none   LOCAL MEDICATIONS USED:  BUPIVICAINE   SPECIMEN:  Excision  DISPOSITION OF SPECIMEN:  PATHOLOGY  COUNTS:  YES  TOURNIQUET:   Total Tourniquet Time Documented: Forearm (Left) - 22 minutes Total: Forearm (Left) - 22 minutes   DICTATION: .Dragon Dictation  PLAN OF CARE: Discharge to home after PACU  PATIENT DISPOSITION:  PACU - hemodynamically stable.

## 2018-04-11 NOTE — Op Note (Signed)
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   ASSISTANT: none   ANESTHESIA:  Bier block with sedation and Local   INTRAVENOUS FLUIDS:  Per anesthesia flow sheet.   ESTIMATED BLOOD LOSS:  Minimal.   COMPLICATIONS:  None.   SPECIMENS:   Cyst synovial tissue osteophyte   TOURNIQUET TIME:    Total Tourniquet Time Documented: Forearm (Left) - 22 minutes Total: Forearm (Left) - 22 minutes    DISPOSITION:  Stable to PACU.   INDICATIONS: Patient is a 57 year old female with a history of a mass over the top of the distal phalangeal joint left middle finger with grooving of the nail plate distally.  Transillumination reveals a cyst x-rays revealed minimal degenerative change of the distal phalangeal joint she is elected to have this surgically excised.  Pre-peri-and postoperative course been discussed along with risks and complications.  She is aware that there is no guarantee to the surgery the possibility of infection recurrence injury to arteries nerves tendons and complete relief symptoms and dystrophy.  In the preoperative area the patient is seen extremity marked by both patient and surgeon antibiotic given  OPERATIVE COURSE: She is brought to the operating room where form based IV regional anesthetic was carried out without difficulty under the direction the anesthesia department.  She was prepped using ChloraPrep in the supine position with a left arm free.  A three-minute dry time was allowed timeout taken confirming patient procedure.  A  metacarpal block was given quarter percent bupivacaine without epinephrine 8 cc was used.  After adequate anesthesia was afforded a curvilinear incision was made over the distal phalangeal joint into the mid lateral line of the left middle finger carried down through subcutaneous tissue.  Bleeders were electrocauterized necessary with bipolar.  A dissection was made between the skin and extensor tendon and nail plate distally.  This allowed isolation of the cyst with a house curette and a hemostatic rondure which then remove the cyst.  This was sent to pathology.  The joint was opened on its ulnar aspect.  A synovectomy and removal of osteophytes was then performed with a house curette and the hemostatic rondure.  Specimen was sent to pathology.  The wound was copiously irrigated with saline.  The skin was closed and opted for nylon sutures.  A sterile compressive dressing and splint to the finger was applied.  Inflation of the tourniquet remaining fingers pink.  She was taken to the recovery room for observation in satisfactory condition.  She will be discharged home to return [place] in 1 week she will try Tylenol ibuprofen for pain but will have Ultram for breakthrough.   Daryll Brod, MD Electronically signed, 04/11/18

## 2018-04-11 NOTE — Anesthesia Postprocedure Evaluation (Signed)
Anesthesia Post Note  Patient: [name]  Procedure(s) Performed: EXCISION MASS (Left Hand) DEBIDMENT DISTAL INTERPHALANGEAL LEFT MIDDLE (Left Hand)     Patient location during evaluation: PACU Anesthesia Type: MAC and Bier Block Level of consciousness: awake and alert Pain management: pain level controlled Vital Signs Assessment: post-procedure vital signs reviewed and stable Respiratory status: spontaneous breathing, nonlabored ventilation, respiratory function stable and patient connected to nasal cannula oxygen Cardiovascular status: stable and blood pressure returned to baseline Postop Assessment: no apparent nausea or vomiting Anesthetic complications: no    Last Vitals:  Vitals:   04/11/18 0947 04/11/18 0955  BP:  114/69  Pulse: 60 65  Resp: 19 20  Temp:  (!) 36.4 C  SpO2: 97% 97%    Last Pain:  Vitals:   04/11/18 0955  TempSrc:   PainSc: 0-No pain                 Tiajuana Amass

## 2018-04-11 NOTE — Transfer of Care (Signed)
Immediate Anesthesia Transfer of Care Note  Patient: [name]  Procedure(s) Performed: EXCISION MASS (Left Hand) DEBIDMENT DISTAL INTERPHALANGEAL LEFT MIDDLE (Left Hand)  Patient Location: PACU  Anesthesia Type:MAC and Bier block  Level of Consciousness: awake, alert  and oriented  Airway & Oxygen Therapy: Patient Spontanous Breathing and Patient connected to face mask oxygen  Post-op Assessment: Report given to RN and Post -op Vital signs reviewed and stable  Post vital signs: Reviewed and stable  Last Vitals:  Vitals Value Taken Time  BP    Temp    Pulse 62 04/11/2018  9:06 AM  Resp 16 04/11/2018  9:06 AM  SpO2 99 % 04/11/2018  9:06 AM  Vitals shown include unvalidated device data.  Last Pain:  Vitals:   04/11/18 0746  TempSrc: Oral  PainSc: 0-No pain         Complications: No apparent anesthesia complications

## 2018-04-11 NOTE — H&P (Signed)
[name] is an 57 y.o. female.   Chief Complaint: mass left middle fingerHPI: [name] is a 57 year old right-hand-dominant female referred by Dr. Maryellen Pile for consultation regarding lumps on her middle fingers bilaterally and thumb On her right side. The mass is been present for 1 to 2 years. She has grooving of the nail on her left side. She is also complaining of numbness and tingling. She has no history of injury to the hand or to the neck. He does not localize the numbness and tingling well feels that it is all fingers. She is occasionally awakened at night. She has not had any treatment nor tried anything for this. Nothing makes it better or worse.She was referred for nerve conductions to the numbness and tingling. These have been done and read by Dr. Posey Pronto revealing a carpal tunnel syndrome on her right side negative on her left. This shows a prolonged peak latency of 4 ms in the sensory component.   She has a history of diabetes thyroid problems no history of arthritis or gout. Family history is positive diabetes negative for thyroid problems arthritis and gout.    Past Medical History:  Diagnosis Date  . Acute bronchitis 05/25/2016  . Anxiety   . Chronic pain syndrome 01/06/2013  . Chronic rhinosinusitis   . Colon polyps   . Cough 01/06/2013  . Depression   . Diabetes ([place]) 01/06/2013  . Diabetes mellitus type 2 in obese [facility]) 01/06/2013   Sees Dr Calvert Cantor for eye exam Does not see podiatry, foot exam today unremarkable except for thick cracking skin on heals   . Dysuria 05/25/2016  . Edema 01/06/2013  . Epistaxis 05/25/2016  . Fibroid, uterine   . GERD (gastroesophageal reflux disease)   . Glaucoma 12-13  . Hoarseness   . Hoarseness of voice 05/11/2013  . Hyperglycemia 04/13/2013  . Hyperlipemia, mixed 06/06/2007   Qualifier: Diagnosis of  By: Wynona Luna She feels Lipitor caused increased low back pain and weakness    . Hyperlipidemia   . Hypertension   . Hypokalemia  02/05/2013  . IBS (irritable bowel syndrome) 02/13/2011  . Low back pain 03/03/2013  . Muscle cramp 05/22/2014  . Obesity   . Obesity, unspecified 05/11/2013  . OSA (obstructive sleep apnea)   . Pain in joint, shoulder region 06/27/2015  . Perimenopause 10/05/2012  . Raynaud disease 06/16/2013  . Thyroid disease    hypothyroidism  . Vocal fold nodules     Past Surgical History:  Procedure Laterality Date  . ABDOMINAL HYSTERECTOMY    . BREAST EXCISIONAL BIOPSY Right    at age 37  . CHOLECYSTECTOMY    . CYSTECTOMY    . DILATION AND CURETTAGE OF UTERUS     x2  . OOPHORECTOMY    . POLYPECTOMY    . ROTATOR CUFF REPAIR      Family History  Problem Relation Age of Onset  . Alcohol abuse Father   . Cancer Father        renal and colon  . Hyperlipidemia Father   . Hypertension Father   . Stroke Father   . Heart disease Father   . Cancer Paternal Grandfather        colon  . Diabetes Unknown   . High blood pressure Mother   . High Cholesterol Mother   . Kidney disease Mother   . Depression Mother   . Anxiety disorder Mother   . Obesity Mother   . Breast cancer Other  65   Social History:  reports that she quit smoking about 14 years ago. Her smoking use included cigarettes. She has a 15.00 pack-year smoking history. She has never used smokeless tobacco. She reports that she does not drink alcohol or use drugs.  Allergies:  Allergies  Allergen Reactions  . Losartan Cough  . Wellbutrin [Bupropion] Other (See Comments)    Dizziness and mental status changes  . Aspirin Other (See Comments)    GI upset REACTION: Upset stomach  . Atorvastatin     myalgias  . Codeine   . Amoxicillin Rash  . Erythromycin Rash  . Penicillins Rash    No medications prior to admission.    No results found for this or any previous visit (from the past 48 hour(s)).  No results found.   Pertinent items are noted in HPI.  Height 5\' 4"  (1.626 m), weight 108.9 kg.  General appearance:  alert, cooperative and appears stated age Head: Normocephalic, without obvious abnormality Neck: no JVD Resp: clear to auscultation bilaterally Cardio: regular rate and rhythm, S1, S2 normal, no murmur, click, rub or gallop GI: soft, non-tender; bowel sounds normal; no masses,  no organomegaly Extremities: mass left middle finger Pulses: 2+ and symmetric Skin: Skin color, texture, turgor normal. No rashes or lesions Neurologic: Grossly normal Incision/Wound: na  Assessment/Plan Assessment:  1. Osteoarthritis of finger of left hand  2. Mucoid cyst, joint  3. Carpal tunnel syndrome of right wrist    Plan: Have discussed proceeding with excision of the cyst debridement of the distant phalangeal joint left middle finger. Pre-peri-and postoperative course are discussed along with risk complications. She is aware there is no guarantee to the surgery possibility of infection recurrence injury to arteries nerves tendons complete relief symptoms dystrophy. She is would like to proceed and scheduled for excision mucoid cyst debridement distal phalangeal joint left middle finger as an outpatient under regional anesthesia.    Daryll Brod 04/11/2018, 5:17 AM

## 2018-04-11 NOTE — Discharge Instructions (Addendum)

## 2018-04-12 ENCOUNTER — Encounter (HOSPITAL_BASED_OUTPATIENT_CLINIC_OR_DEPARTMENT_OTHER): Payer: Self-pay | Admitting: Orthopedic Surgery

## 2018-04-19 MED FILL — FUROSEMIDE 20 MG TABS: 20 | 20 days supply | Qty: 40 | Fill #1

## 2018-04-19 MED FILL — FENOFIBRATE 134 MG CAPSULE: 134 | 30 days supply | Qty: 30 | Fill #2

## 2018-04-22 MED FILL — MODAFINIL 200 MG TABLET: 200 | 30 days supply | Qty: 60 | Fill #1

## 2018-04-26 DIAGNOSIS — M19042 Primary osteoarthritis, left hand: Secondary | ICD-10-CM | POA: Insufficient documentation

## 2018-05-13 ENCOUNTER — Ambulatory Visit: Payer: Self-pay | Admitting: Family Medicine

## 2018-05-15 ENCOUNTER — Other Ambulatory Visit: Payer: Self-pay | Admitting: Family Medicine

## 2018-05-15 MED FILL — FENOFIBRATE 134 MG CAPSULE: 134 | 30 days supply | Qty: 30 | Fill #3

## 2018-05-15 MED FILL — BYSTOLIC 20 MG TABLET: 20 | 90 days supply | Qty: 90 | Fill #1

## 2018-05-15 MED FILL — SPIRONOLACTONE 25 MG TABLET: 25 | 30 days supply | Qty: 60 | Fill #2

## 2018-05-16 MED FILL — FUROSEMIDE 20 MG TABS: 20 | 20 days supply | Qty: 40 | Fill #0

## 2018-05-16 MED FILL — SYNTHROID 100 MCG TABLET: 100 | 90 days supply | Qty: 90 | Fill #0

## 2018-05-20 MED FILL — VIT D2 1.25 MG (50,000 UNIT: 1.25 MG | 84 days supply | Qty: 12 | Fill #1

## 2018-05-21 ENCOUNTER — Ambulatory Visit: Payer: 59 | Admitting: Family Medicine

## 2018-05-21 ENCOUNTER — Encounter: Payer: Self-pay | Admitting: Family Medicine

## 2018-05-21 VITALS — BP 122/68 | HR 90 | Temp 98.0°F | Resp 18 | Wt 238.8 lb

## 2018-05-21 DIAGNOSIS — I10 Essential (primary) hypertension: Secondary | ICD-10-CM | POA: Diagnosis not present

## 2018-05-21 DIAGNOSIS — E119 Type 2 diabetes mellitus without complications: Secondary | ICD-10-CM

## 2018-05-21 DIAGNOSIS — E1169 Type 2 diabetes mellitus with other specified complication: Secondary | ICD-10-CM | POA: Diagnosis not present

## 2018-05-21 DIAGNOSIS — E782 Mixed hyperlipidemia: Secondary | ICD-10-CM | POA: Diagnosis not present

## 2018-05-21 DIAGNOSIS — R7303 Prediabetes: Secondary | ICD-10-CM

## 2018-05-21 DIAGNOSIS — N951 Menopausal and female climacteric states: Secondary | ICD-10-CM

## 2018-05-21 DIAGNOSIS — E039 Hypothyroidism, unspecified: Secondary | ICD-10-CM | POA: Diagnosis not present

## 2018-05-21 DIAGNOSIS — E669 Obesity, unspecified: Secondary | ICD-10-CM

## 2018-05-21 DIAGNOSIS — E559 Vitamin D deficiency, unspecified: Secondary | ICD-10-CM | POA: Diagnosis not present

## 2018-05-21 NOTE — Patient Instructions (Signed)

## 2018-05-21 NOTE — Progress Notes (Signed)
Subjective:    Patient ID: [name], female    DOB: [birth year], 58 y.o.   MRN: [id number]  No chief complaint on file.   HPI  Patient is in today for her 4 month follow up. She is preparing to go on a cruise next week and is very excited. She is doing well overall and has no acute complaints. She asked for her second shingrix shot today. Pt showed the cysts on her DIP joints s/t arthritis that she recently had an orthopedic surgeon look at, and he decided to remove one of them, from which she has been recovering well. She has some hot flashes lately but attributes that to hormonal issues after oovohystorectomy.  Patient Care Team: Mosie Lukes, MD as PCP - General (Family Medicine) Molli Posey, MD as Consulting Physician (Obstetrics and Gynecology) Virgina Evener, OD as Referring Physician (Optometry)   Past Medical History:  Diagnosis Date  . Acute bronchitis 05/25/2016  . Anxiety   . Chronic pain syndrome 01/06/2013  . Chronic rhinosinusitis   . Colon polyps   . Cough 01/06/2013  . Depression   . Diabetes ([place]) 01/06/2013  . Diabetes mellitus type 2 in obese [facility]) 01/06/2013   Sees Dr Calvert Cantor for eye exam Does not see podiatry, foot exam today unremarkable except for thick cracking skin on heals   . Dysuria 05/25/2016  . Edema 01/06/2013  . Epistaxis 05/25/2016  . Fibroid, uterine   . GERD (gastroesophageal reflux disease)   . Glaucoma 12-13  . Hoarseness   . Hoarseness of voice 05/11/2013  . Hyperglycemia 04/13/2013  . Hyperlipemia, mixed 06/06/2007   Qualifier: Diagnosis of  By: Wynona Luna She feels Lipitor caused increased low back pain and weakness    . Hyperlipidemia   . Hypertension   . Hypokalemia 02/05/2013  . IBS (irritable bowel syndrome) 02/13/2011  . Low back pain 03/03/2013  . Muscle cramp 05/22/2014  . Obesity   . Obesity, unspecified 05/11/2013  . OSA (obstructive sleep apnea)   . Pain in joint, shoulder region 06/27/2015  . Perimenopause  10/05/2012  . Raynaud disease 06/16/2013  . Thyroid disease    hypothyroidism  . Vocal fold nodules     Past Surgical History:  Procedure Laterality Date  . ABDOMINAL HYSTERECTOMY    . BREAST EXCISIONAL BIOPSY Right    at age 1  . CHOLECYSTECTOMY    . CYSTECTOMY    . DILATION AND CURETTAGE OF UTERUS     x2  . I&D EXTREMITY Left 04/11/2018   Procedure: DEBIDMENT DISTAL INTERPHALANGEAL LEFT MIDDLE;  Surgeon: Daryll Brod, MD;  Location: [place];  Service: Orthopedics;  Laterality: Left;  Marland Kitchen MASS EXCISION Left 04/11/2018   Procedure: EXCISION MASS;  Surgeon: Daryll Brod, MD;  Location: [place];  Service: Orthopedics;  Laterality: Left;  . OOPHORECTOMY    . POLYPECTOMY    . ROTATOR CUFF REPAIR      Family History  Problem Relation Age of Onset  . Alcohol abuse Father   . Cancer Father        renal and colon  . Hyperlipidemia Father   . Hypertension Father   . Stroke Father   . Heart disease Father   . Cancer Paternal Grandfather        colon  . Diabetes Other   . High blood pressure Mother   . High Cholesterol Mother   . Kidney disease Mother   . Depression Mother   .  Anxiety disorder Mother   . Obesity Mother   . Breast cancer Other 45  . Diabetes Sister   . Diabetes Brother     Social History   Socioeconomic History  . Marital status: Significant Other    Spouse name: Not on file  . Number of children: 0  . Years of education: Not on file  . Highest education level: Not on file  Occupational History  . Occupation: Technical sales engineer  Social Needs  . Financial resource strain: Not on file  . Food insecurity:    Worry: Not on file    Inability: Not on file  . Transportation needs:    Medical: Not on file    Non-medical: Not on file  Tobacco Use  . Smoking status: Former Smoker    Packs/day: 0.50    Years: 30.00    Pack years: 15.00    Types: Cigarettes    Last attempt to quit: 04/25/2003    Years since quitting:  15.0  . Smokeless tobacco: Never Used  . Tobacco comment: smoked since age 60  Substance and Sexual Activity  . Alcohol use: No  . Drug use: Never  . Sexual activity: Yes    Partners: Male  Lifestyle  . Physical activity:    Days per week: Not on file    Minutes per session: Not on file  . Stress: Not on file  Relationships  . Social connections:    Talks on phone: Not on file    Gets together: Not on file    Attends religious service: Not on file    Active member of club or organization: Not on file    Attends meetings of clubs or organizations: Not on file    Relationship status: Not on file  . Intimate partner violence:    Fear of current or ex partner: Not on file    Emotionally abused: Not on file    Physically abused: Not on file    Forced sexual activity: Not on file  Other Topics Concern  . Not on file  Social History Narrative   Endo-- Dr Dwyane Dee   ENT--Dr shoemaker   GI--Dr Buccini   Pulm--Dr Clance   Rheum--Dr Charlestine Night          Outpatient Medications Prior to Visit  Medication Sig Dispense Refill  . acetaminophen (TYLENOL) 325 MG tablet Take 650 mg by mouth every 6 (six) hours as needed.    Marland Kitchen albuterol (VENTOLIN HFA) 108 (90 Base) MCG/ACT inhaler INHALE 2 PUFFS INTO THE LUNGS EVERY 4 HOURS AS NEEDED FOR WHEEZING OR SHORTNESS OF BREATH. 18 g 6  . BYSTOLIC 20 MG TABS TAKE 1 TABLET BY MOUTH ONCE DAILY 90 tablet 2  . Cod Liver Oil 1000 MG CAPS Take 1 capsule by mouth daily.    . diphenhydrAMINE (BENADRYL) 25 MG tablet Take 25 mg by mouth every 6 (six) hours as needed.    . fenofibrate micronized (LOFIBRA) 134 MG capsule TAKE 1 CAPSULE BY MOUTH DAILY BEFORE BREAKFAST. 30 capsule 0  . furosemide (LASIX) 20 MG tablet TAKE 1 TO 2 TABLETS BY MOUTH DAILY AS NEEDED FOR FLUID OR EDEMA 40 tablet 1  . loratadine (CLARITIN) 10 MG tablet Take 10 mg by mouth daily.    . metFORMIN (GLUCOPHAGE) 1000 MG tablet Take 1 tablet (1,000 mg total) by mouth daily with breakfast. 30 tablet  0  . modafinil (PROVIGIL) 200 MG tablet Take 2 tablets (400 mg total) by mouth daily. 60 tablet  5  . spironolactone (ALDACTONE) 25 MG tablet TAKE 2 TABLETS BY MOUTH DAILY 60 tablet 4  . SYNTHROID 100 MCG tablet TAKE 1 TABLET (100 MCG TOTAL) BY MOUTH DAILY BEFORE BREAKFAST. 90 tablet 1  . traMADol (ULTRAM) 50 MG tablet Take 1 tablet (50 mg total) by mouth every 6 (six) hours as needed. 20 tablet 0  . Vitamin D, Ergocalciferol, (DRISDOL) 50000 units CAPS capsule TAKE 1 CAPSULE BY MOUTH EVERY 7 DAYS 12 capsule 1   No facility-administered medications prior to visit.     Allergies  Allergen Reactions  . Losartan Cough  . Wellbutrin [Bupropion] Other (See Comments)    Dizziness and mental status changes  . Aspirin Other (See Comments)    GI upset REACTION: Upset stomach  . Atorvastatin     myalgias  . Codeine   . Amoxicillin Rash  . Erythromycin Rash  . Penicillins Rash    Review of Systems  Constitutional: Negative for chills, fever, malaise/fatigue and weight loss.  HENT: Negative for ear pain.   Eyes: Negative for pain.  Respiratory: Negative for shortness of breath.   Cardiovascular: Negative for chest pain and palpitations.  Gastrointestinal: Positive for constipation (chronic constipation controlled with MOM, prune juice and occasionally enema). Negative for abdominal pain and diarrhea.       Objective:     Ashey Tramontana appears her stated age, WDWN, and in no acute distress.  Physical Exam Constitutional:      Appearance: Normal appearance.  HENT:     Head: Normocephalic and atraumatic.     Nose: Nose normal.     Mouth/Throat:     Mouth: Mucous membranes are moist.     Pharynx: Oropharynx is clear.  Neck:     Musculoskeletal: Neck supple.  Cardiovascular:     Rate and Rhythm: Normal rate and regular rhythm.     Heart sounds: Normal heart sounds.  Pulmonary:     Effort: Pulmonary effort is normal.     Breath sounds: Normal breath sounds.  Abdominal:      General: Bowel sounds are normal.  Neurological:     Mental Status: She is alert and oriented to person, place, and time.  Psychiatric:        Mood and Affect: Mood normal.        Behavior: Behavior normal.     BP 122/68 (BP Location: Left Arm, Patient Position: Sitting, Cuff Size: Normal)   Pulse 90   Temp 98 F (36.7 C) (Oral)   Resp 18   Wt 108.3 kg   SpO2 99%   BMI 40.99 kg/m  Wt Readings from Last 3 Encounters:  05/21/18 108.3 kg  04/11/18 107.9 kg  01/10/18 107.9 kg   BP Readings from Last 3 Encounters:  05/21/18 122/68  04/11/18 114/69  01/10/18 136/64     Immunization History  Administered Date(s) Administered  . Influenza Whole 01/23/2008, 01/28/2009, 02/16/2010  . Influenza,inj,Quad PF,6+ Mos 01/06/2013, 01/10/2016, 01/10/2018  . Influenza-Unspecified 01/22/2014, 02/09/2015, 01/17/2017  . Pneumococcal Conjugate-13 05/22/2014  . Pneumococcal Polysaccharide-23 03/22/2015  . Td 12/18/2000  . Tdap 02/10/2014  . Zoster Recombinat (Shingrix) 01/10/2018    Health Maintenance  Topic Date Due  . OPHTHALMOLOGY EXAM  02/04/2014  . FOOT EXAM  08/19/2016  . URINE MICROALBUMIN  10/11/2017  . PAP SMEAR-Modifier  06/29/2018  . HEMOGLOBIN A1C  07/11/2018  . MAMMOGRAM  08/15/2019  . COLONOSCOPY  04/05/2022  . TETANUS/TDAP  02/11/2024  . INFLUENZA VACCINE  Completed  . PNEUMOCOCCAL  POLYSACCHARIDE VACCINE AGE 25-64 HIGH RISK  Completed  . Hepatitis C Screening  Completed  . HIV Screening  Completed    Lab Results  Component Value Date   WBC 4.3 01/10/2018   HGB 13.8 01/10/2018   HCT 41.6 01/10/2018   PLT 367.0 01/10/2018   GLUCOSE 84 04/04/2018   CHOL 197 01/10/2018   TRIG 130.0 01/10/2018   HDL 49.40 01/10/2018   LDLDIRECT 103.0 07/11/2016   LDLCALC 121 (H) 01/10/2018   ALT 18 01/10/2018   AST 17 01/10/2018   NA 141 04/04/2018   K 4.3 04/04/2018   CL 106 04/04/2018   CREATININE 1.11 (H) 04/04/2018   BUN 11 04/04/2018   CO2 25 04/04/2018   TSH 0.54  01/10/2018   HGBA1C 6.2 01/10/2018   MICROALBUR 0.7 10/11/2016    Lab Results  Component Value Date   TSH 0.54 01/10/2018   Lab Results  Component Value Date   WBC 4.3 01/10/2018   HGB 13.8 01/10/2018   HCT 41.6 01/10/2018   MCV 89.7 01/10/2018   PLT 367.0 01/10/2018   Lab Results  Component Value Date   NA 141 04/04/2018   K 4.3 04/04/2018   CO2 25 04/04/2018   GLUCOSE 84 04/04/2018   BUN 11 04/04/2018   CREATININE 1.11 (H) 04/04/2018   BILITOT 0.3 01/10/2018   ALKPHOS 51 01/10/2018   AST 17 01/10/2018   ALT 18 01/10/2018   PROT 7.2 01/10/2018   ALBUMIN 4.4 01/10/2018   CALCIUM 10.2 04/04/2018   ANIONGAP 10 04/04/2018   GFR 69.44 01/10/2018   Lab Results  Component Value Date   CHOL 197 01/10/2018   Lab Results  Component Value Date   HDL 49.40 01/10/2018   Lab Results  Component Value Date   LDLCALC 121 (H) 01/10/2018   Lab Results  Component Value Date   TRIG 130.0 01/10/2018   Lab Results  Component Value Date   CHOLHDL 4 01/10/2018   Lab Results  Component Value Date   HGBA1C 6.2 01/10/2018         Assessment & Plan:   Problems addressed this visit  Essential HTN: well controlled recently with no complaints of dizziness or headaches. Encouraged heart healthy diet, hydration, and exercise as tolerated. - orders: TSH, CMP, CBC  Hypothyroidism: Well controlled per last several labs. Pt has had some episodes of sweating and chronic constipation, so rechecking TSH - Orders: TSH  DM type 2: A1c acceptable. Encouraged heart healthy diet and exercise. - orders: HgB A1c  Hyperlipidemia: recent labs were decent although LDL was higher than preferred. Encouraged heart healthy diet and exercise. - orders: lipid profile  I am having Melisa S. Cowin maintain her albuterol, Cod Liver Oil, loratadine, acetaminophen, diphenhydrAMINE, metFORMIN, fenofibrate micronized, Vitamin D (Ergocalciferol), BYSTOLIC, spironolactone, modafinil, traMADol,  furosemide, and SYNTHROID.  No orders of the defined types were placed in this encounter.   Court Joy, Student-PA

## 2018-05-23 MED FILL — FREESTYLE LITE METER: 1 days supply | Qty: 1 | Fill #0

## 2018-05-23 MED FILL — FREESTYLE LANCETS: 90 days supply | Qty: 100 | Fill #0

## 2018-05-23 MED FILL — FREESTYLE LITE TEST STRIP: 90 days supply | Qty: 100 | Fill #0

## 2018-05-25 NOTE — Assessment & Plan Note (Signed)
Well controlled, no changes to meds. Encouraged heart healthy diet such as the DASH diet and exercise as tolerated.  °

## 2018-05-25 NOTE — Assessment & Plan Note (Signed)
Encouraged small, frequent meals with lean proteins and minimal complex carbohydrates. Stay active and hydrate well

## 2018-05-25 NOTE — Assessment & Plan Note (Signed)
Supplement and monitor 

## 2018-05-25 NOTE — Progress Notes (Signed)
Subjective:    Patient ID: [name], female    DOB: 07/25/1960, 58 y.o.   MRN: [id number]  No chief complaint on file.   HPI Patient is in today for follow up and had cyst removed from fingers with good results. No polyuria or polydipsia. Does note some hot flashes on occasion with heat and sweeling. Her bowels continue to fluctuate between constipation and sweating. No bloody or tarry stool. Uses an enema roughtly monthly as well as MOM and prune juice. Denies CP/palp/SOB/HA/congestion/fevers/GI or GU c/o. Taking meds as prescribed  Past Medical History:  Diagnosis Date  . Acute bronchitis 05/25/2016  . Anxiety   . Chronic pain syndrome 01/06/2013  . Chronic rhinosinusitis   . Colon polyps   . Cough 01/06/2013  . Depression   . Diabetes ([place]) 01/06/2013  . Diabetes mellitus type 2 in obese [facility]) 01/06/2013   Sees Dr Calvert Cantor for eye exam Does not see podiatry, foot exam today unremarkable except for thick cracking skin on heals   . Dysuria 05/25/2016  . Edema 01/06/2013  . Epistaxis 05/25/2016  . Fibroid, uterine   . GERD (gastroesophageal reflux disease)   . Glaucoma 12-13  . Hoarseness   . Hoarseness of voice 05/11/2013  . Hyperglycemia 04/13/2013  . Hyperlipemia, mixed 06/06/2007   Qualifier: Diagnosis of  By: Wynona Luna She feels Lipitor caused increased low back pain and weakness    . Hyperlipidemia   . Hypertension   . Hypokalemia 02/05/2013  . IBS (irritable bowel syndrome) 02/13/2011  . Low back pain 03/03/2013  . Muscle cramp 05/22/2014  . Obesity   . Obesity, unspecified 05/11/2013  . OSA (obstructive sleep apnea)   . Pain in joint, shoulder region 06/27/2015  . Perimenopause 10/05/2012  . Raynaud disease 06/16/2013  . Thyroid disease    hypothyroidism  . Vocal fold nodules     Past Surgical History:  Procedure Laterality Date  . ABDOMINAL HYSTERECTOMY    . BREAST EXCISIONAL BIOPSY Right    at age 16  . CHOLECYSTECTOMY    . CYSTECTOMY    . DILATION  AND CURETTAGE OF UTERUS     x2  . I&D EXTREMITY Left 04/11/2018   Procedure: DEBIDMENT DISTAL INTERPHALANGEAL LEFT MIDDLE;  Surgeon: Daryll Brod, MD;  Location: [place];  Service: Orthopedics;  Laterality: Left;  Marland Kitchen MASS EXCISION Left 04/11/2018   Procedure: EXCISION MASS;  Surgeon: Daryll Brod, MD;  Location: [place];  Service: Orthopedics;  Laterality: Left;  . OOPHORECTOMY    . POLYPECTOMY    . ROTATOR CUFF REPAIR      Family History  Problem Relation Age of Onset  . Alcohol abuse Father   . Cancer Father        renal and colon  . Hyperlipidemia Father   . Hypertension Father   . Stroke Father   . Heart disease Father   . Cancer Paternal Grandfather        colon  . Diabetes Other   . High blood pressure Mother   . High Cholesterol Mother   . Kidney disease Mother   . Depression Mother   . Anxiety disorder Mother   . Obesity Mother   . Breast cancer Other 45  . Diabetes Sister   . Diabetes Brother     Social History   Socioeconomic History  . Marital status: Significant Other    Spouse name: Not on file  . Number of children:  0  . Years of education: Not on file  . Highest education level: Not on file  Occupational History  . Occupation: Technical sales engineer  Social Needs  . Financial resource strain: Not on file  . Food insecurity:    Worry: Not on file    Inability: Not on file  . Transportation needs:    Medical: Not on file    Non-medical: Not on file  Tobacco Use  . Smoking status: Former Smoker    Packs/day: 0.50    Years: 30.00    Pack years: 15.00    Types: Cigarettes    Last attempt to quit: 04/25/2003    Years since quitting: 15.0  . Smokeless tobacco: Never Used  . Tobacco comment: smoked since age 1  Substance and Sexual Activity  . Alcohol use: No  . Drug use: Never  . Sexual activity: Yes    Partners: Male  Lifestyle  . Physical activity:    Days per week: Not on file    Minutes per session: Not  on file  . Stress: Not on file  Relationships  . Social connections:    Talks on phone: Not on file    Gets together: Not on file    Attends religious service: Not on file    Active member of club or organization: Not on file    Attends meetings of clubs or organizations: Not on file    Relationship status: Not on file  . Intimate partner violence:    Fear of current or ex partner: Not on file    Emotionally abused: Not on file    Physically abused: Not on file    Forced sexual activity: Not on file  Other Topics Concern  . Not on file  Social History Narrative   Endo-- Dr Dwyane Dee   ENT--Dr shoemaker   GI--Dr Buccini   Pulm--Dr Clance   Rheum--Dr Charlestine Night          Outpatient Medications Prior to Visit  Medication Sig Dispense Refill  . acetaminophen (TYLENOL) 325 MG tablet Take 650 mg by mouth every 6 (six) hours as needed.    Marland Kitchen albuterol (VENTOLIN HFA) 108 (90 Base) MCG/ACT inhaler INHALE 2 PUFFS INTO THE LUNGS EVERY 4 HOURS AS NEEDED FOR WHEEZING OR SHORTNESS OF BREATH. 18 g 6  . BYSTOLIC 20 MG TABS TAKE 1 TABLET BY MOUTH ONCE DAILY 90 tablet 2  . Cod Liver Oil 1000 MG CAPS Take 1 capsule by mouth daily.    . diphenhydrAMINE (BENADRYL) 25 MG tablet Take 25 mg by mouth every 6 (six) hours as needed.    . fenofibrate micronized (LOFIBRA) 134 MG capsule TAKE 1 CAPSULE BY MOUTH DAILY BEFORE BREAKFAST. 30 capsule 0  . furosemide (LASIX) 20 MG tablet TAKE 1 TO 2 TABLETS BY MOUTH DAILY AS NEEDED FOR FLUID OR EDEMA 40 tablet 1  . loratadine (CLARITIN) 10 MG tablet Take 10 mg by mouth daily.    . metFORMIN (GLUCOPHAGE) 1000 MG tablet Take 1 tablet (1,000 mg total) by mouth daily with breakfast. 30 tablet 0  . modafinil (PROVIGIL) 200 MG tablet Take 2 tablets (400 mg total) by mouth daily. 60 tablet 5  . spironolactone (ALDACTONE) 25 MG tablet TAKE 2 TABLETS BY MOUTH DAILY 60 tablet 4  . SYNTHROID 100 MCG tablet TAKE 1 TABLET (100 MCG TOTAL) BY MOUTH DAILY BEFORE BREAKFAST. 90 tablet 1    . traMADol (ULTRAM) 50 MG tablet Take 1 tablet (50 mg total) by mouth  every 6 (six) hours as needed. 20 tablet 0  . Vitamin D, Ergocalciferol, (DRISDOL) 50000 units CAPS capsule TAKE 1 CAPSULE BY MOUTH EVERY 7 DAYS 12 capsule 1   No facility-administered medications prior to visit.     Allergies  Allergen Reactions  . Losartan Cough  . Wellbutrin [Bupropion] Other (See Comments)    Dizziness and mental status changes  . Aspirin Other (See Comments)    GI upset REACTION: Upset stomach  . Atorvastatin     myalgias  . Codeine   . Amoxicillin Rash  . Erythromycin Rash  . Penicillins Rash    Review of Systems  Constitutional: Negative for fever and malaise/fatigue.  HENT: Negative for congestion.   Eyes: Negative for blurred vision.  Respiratory: Negative for shortness of breath.   Cardiovascular: Negative for chest pain, palpitations and leg swelling.  Gastrointestinal: Negative for abdominal pain, blood in stool and nausea.  Genitourinary: Negative for dysuria and frequency.  Musculoskeletal: Negative for falls.  Skin: Negative for rash.  Neurological: Negative for dizziness, loss of consciousness and headaches.  Endo/Heme/Allergies: Negative for environmental allergies.  Psychiatric/Behavioral: Negative for depression. The patient is not nervous/anxious.        Objective:    Physical Exam Vitals signs and nursing note reviewed.  Constitutional:      General: She is not in acute distress.    Appearance: She is well-developed.  HENT:     Head: Normocephalic and atraumatic.     Nose: Nose normal.  Eyes:     General:        Right eye: No discharge.        Left eye: No discharge.  Neck:     Musculoskeletal: Normal range of motion and neck supple.  Cardiovascular:     Rate and Rhythm: Normal rate and regular rhythm.     Heart sounds: No murmur.  Pulmonary:     Effort: Pulmonary effort is normal.     Breath sounds: Normal breath sounds.  Abdominal:     General:  Bowel sounds are normal.     Palpations: Abdomen is soft.     Tenderness: There is no abdominal tenderness.  Skin:    General: Skin is warm and dry.  Neurological:     Mental Status: She is alert and oriented to person, place, and time.     BP 122/68 (BP Location: Left Arm, Patient Position: Sitting, Cuff Size: Normal)   Pulse 90   Temp 98 F (36.7 C) (Oral)   Resp 18   Wt 238 lb 12.8 oz (108.3 kg)   SpO2 99%   BMI 40.99 kg/m  Wt Readings from Last 3 Encounters:  05/21/18 238 lb 12.8 oz (108.3 kg)  04/11/18 237 lb 14 oz (107.9 kg)  01/10/18 237 lb 12.8 oz (107.9 kg)     Lab Results  Component Value Date   WBC 4.3 01/10/2018   HGB 13.8 01/10/2018   HCT 41.6 01/10/2018   PLT 367.0 01/10/2018   GLUCOSE 84 04/04/2018   CHOL 197 01/10/2018   TRIG 130.0 01/10/2018   HDL 49.40 01/10/2018   LDLDIRECT 103.0 07/11/2016   LDLCALC 121 (H) 01/10/2018   ALT 18 01/10/2018   AST 17 01/10/2018   NA 141 04/04/2018   K 4.3 04/04/2018   CL 106 04/04/2018   CREATININE 1.11 (H) 04/04/2018   BUN 11 04/04/2018   CO2 25 04/04/2018   TSH 0.54 01/10/2018   HGBA1C 6.2 01/10/2018   MICROALBUR 0.7 10/11/2016  Lab Results  Component Value Date   TSH 0.54 01/10/2018   Lab Results  Component Value Date   WBC 4.3 01/10/2018   HGB 13.8 01/10/2018   HCT 41.6 01/10/2018   MCV 89.7 01/10/2018   PLT 367.0 01/10/2018   Lab Results  Component Value Date   NA 141 04/04/2018   K 4.3 04/04/2018   CO2 25 04/04/2018   GLUCOSE 84 04/04/2018   BUN 11 04/04/2018   CREATININE 1.11 (H) 04/04/2018   BILITOT 0.3 01/10/2018   ALKPHOS 51 01/10/2018   AST 17 01/10/2018   ALT 18 01/10/2018   PROT 7.2 01/10/2018   ALBUMIN 4.4 01/10/2018   CALCIUM 10.2 04/04/2018   ANIONGAP 10 04/04/2018   GFR 69.44 01/10/2018   Lab Results  Component Value Date   CHOL 197 01/10/2018   Lab Results  Component Value Date   HDL 49.40 01/10/2018   Lab Results  Component Value Date   LDLCALC 121 (H)  01/10/2018   Lab Results  Component Value Date   TRIG 130.0 01/10/2018   Lab Results  Component Value Date   CHOLHDL 4 01/10/2018   Lab Results  Component Value Date   HGBA1C 6.2 01/10/2018       Assessment & Plan:   Problem List Items Addressed This Visit    Hypothyroidism    On Levothyroxine, continue to monitor      Relevant Orders   TSH   Hyperlipemia, mixed   Relevant Orders   Lipid Profile   Essential hypertension - Primary    Well controlled, no changes to meds. Encouraged heart healthy diet such as the DASH diet and exercise as tolerated.       Relevant Orders   CBC   TSH   Comp Met (CMET)   Vitamin D deficiency    Supplement and monitor      Relevant Orders   VITAMIN D 25 Hydroxy (Vit-D Deficiency, Fractures)   Diabetes mellitus type 2 in obese (HCC)    hgba1c acceptable, minimize simple carbs. Increase exercise as tolerated.       Relevant Orders   HgB A1c   Perimenopause    Encouraged small, frequent meals with lean proteins and minimal complex carbohydrates. Stay active and hydrate well      Morbid obesity ([place])    Encouraged DASH diet, decrease po intake and increase exercise as tolerated. Needs 7-8 hours of sleep nightly. Avoid trans fats, eat small, frequent meals every 4-5 hours with lean proteins, complex carbs and healthy fats. Minimize simple carbs      RESOLVED: Prediabetes      I am having Nicosha S. Gerlich maintain her albuterol, Cod Liver Oil, loratadine, acetaminophen, diphenhydrAMINE, metFORMIN, fenofibrate micronized, Vitamin D (Ergocalciferol), BYSTOLIC, spironolactone, modafinil, traMADol, furosemide, and SYNTHROID.  No orders of the defined types were placed in this encounter.    Penni Homans, MD

## 2018-05-25 NOTE — Assessment & Plan Note (Signed)
Encouraged DASH diet, decrease po intake and increase exercise as tolerated. Needs 7-8 hours of sleep nightly. Avoid trans fats, eat small, frequent meals every 4-5 hours with lean proteins, complex carbs and healthy fats. Minimize simple carbs 

## 2018-05-25 NOTE — Assessment & Plan Note (Signed)
On Levothyroxine, continue to monitor 

## 2018-05-25 NOTE — Assessment & Plan Note (Deleted)
hgba1c acceptable, minimize simple carbs. Increase exercise as tolerated.  

## 2018-05-25 NOTE — Assessment & Plan Note (Signed)
hgba1c acceptable, minimize simple carbs. Increase exercise as tolerated.  

## 2018-06-03 ENCOUNTER — Other Ambulatory Visit (INDEPENDENT_AMBULATORY_CARE_PROVIDER_SITE_OTHER): Payer: 59

## 2018-06-03 DIAGNOSIS — E1169 Type 2 diabetes mellitus with other specified complication: Secondary | ICD-10-CM | POA: Diagnosis not present

## 2018-06-03 DIAGNOSIS — E559 Vitamin D deficiency, unspecified: Secondary | ICD-10-CM | POA: Diagnosis not present

## 2018-06-03 DIAGNOSIS — E669 Obesity, unspecified: Secondary | ICD-10-CM | POA: Diagnosis not present

## 2018-06-03 DIAGNOSIS — E782 Mixed hyperlipidemia: Secondary | ICD-10-CM

## 2018-06-03 DIAGNOSIS — E039 Hypothyroidism, unspecified: Secondary | ICD-10-CM | POA: Diagnosis not present

## 2018-06-03 DIAGNOSIS — I10 Essential (primary) hypertension: Secondary | ICD-10-CM

## 2018-06-03 LAB — CBC
HCT: 41.7 % (ref 36.0–46.0)
Hemoglobin: 13.9 g/dL (ref 12.0–15.0)
MCHC: 33.3 g/dL (ref 30.0–36.0)
MCV: 90.5 fl (ref 78.0–100.0)
Platelets: 373 10*3/uL (ref 150.0–400.0)
RBC: 4.61 Mil/uL (ref 3.87–5.11)
RDW: 13.9 % (ref 11.5–15.5)
WBC: 4.7 10*3/uL (ref 4.0–10.5)

## 2018-06-03 LAB — COMPREHENSIVE METABOLIC PANEL
ALT: 19 U/L (ref 0–35)
AST: 20 U/L (ref 0–37)
Albumin: 4.5 g/dL (ref 3.5–5.2)
Alkaline Phosphatase: 54 U/L (ref 39–117)
BUN: 13 mg/dL (ref 6–23)
CO2: 25 mEq/L (ref 19–32)
Calcium: 9.9 mg/dL (ref 8.4–10.5)
Chloride: 106 mEq/L (ref 96–112)
Creatinine, Ser: 0.89 mg/dL (ref 0.40–1.20)
GFR: 78.96 mL/min (ref >60.00)
Glucose, Bld: 82 mg/dL (ref 70–99)
Potassium: 4.2 mEq/L (ref 3.5–5.1)
Sodium: 140 mEq/L (ref 135–145)
Total Bilirubin: 0.3 mg/dL (ref 0.2–1.2)
Total Protein: 7.1 g/dL (ref 6.0–8.3)

## 2018-06-03 LAB — LIPID PANEL
Cholesterol: 204 mg/dL — ABNORMAL HIGH (ref 0–200)
HDL: 48.5 mg/dL (ref >39.00)
LDL Cholesterol: 136 mg/dL — ABNORMAL HIGH (ref 0–99)
NonHDL: 155.34
Total CHOL/HDL Ratio: 4
Triglycerides: 95 mg/dL (ref 0.0–149.0)
VLDL: 19 mg/dL (ref 0.0–40.0)

## 2018-06-03 LAB — TSH: TSH: 0.34 u[IU]/mL — ABNORMAL LOW (ref 0.35–4.50)

## 2018-06-03 LAB — HEMOGLOBIN A1C: Hgb A1c MFr Bld: 6 % (ref 4.6–6.5)

## 2018-06-03 LAB — VITAMIN D 25 HYDROXY (VIT D DEFICIENCY, FRACTURES): VITD: 46.53 ng/mL (ref 30.00–100.00)

## 2018-06-04 ENCOUNTER — Encounter: Payer: Self-pay | Admitting: Family Medicine

## 2018-06-06 DIAGNOSIS — M9902 Segmental and somatic dysfunction of thoracic region: Secondary | ICD-10-CM | POA: Diagnosis not present

## 2018-06-06 DIAGNOSIS — M9903 Segmental and somatic dysfunction of lumbar region: Secondary | ICD-10-CM | POA: Diagnosis not present

## 2018-06-06 DIAGNOSIS — M9905 Segmental and somatic dysfunction of pelvic region: Secondary | ICD-10-CM | POA: Diagnosis not present

## 2018-06-06 DIAGNOSIS — M5136 Other intervertebral disc degeneration, lumbar region: Secondary | ICD-10-CM | POA: Diagnosis not present

## 2018-06-06 DIAGNOSIS — M9901 Segmental and somatic dysfunction of cervical region: Secondary | ICD-10-CM | POA: Diagnosis not present

## 2018-06-06 DIAGNOSIS — M62830 Muscle spasm of back: Secondary | ICD-10-CM | POA: Diagnosis not present

## 2018-06-25 ENCOUNTER — Other Ambulatory Visit: Payer: Self-pay | Admitting: Family Medicine

## 2018-06-25 MED ORDER — FENOFIBRATE MICRONIZED 134 MG PO CAPS: 134.0000 mg | ORAL_CAPSULE | Freq: Every day | ORAL | 5 refills | Status: DC

## 2018-06-25 MED FILL — FENOFIBRATE 134 MG CAPSULE: 134 | 30 days supply | Qty: 30 | Fill #0

## 2018-06-25 MED FILL — FUROSEMIDE 20 MG TABS: 20 | 20 days supply | Qty: 40 | Fill #1

## 2018-06-25 MED FILL — MODAFINIL 200 MG TABLET: 200 | 30 days supply | Qty: 60 | Fill #2

## 2018-06-25 MED FILL — SPIRONOLACTONE 25 MG TABLET: 25 | 30 days supply | Qty: 60 | Fill #3

## 2018-07-02 ENCOUNTER — Other Ambulatory Visit: Payer: Self-pay | Admitting: Family Medicine

## 2018-07-02 DIAGNOSIS — Z1231 Encounter for screening mammogram for malignant neoplasm of breast: Secondary | ICD-10-CM

## 2018-07-04 DIAGNOSIS — M62830 Muscle spasm of back: Secondary | ICD-10-CM | POA: Diagnosis not present

## 2018-07-04 DIAGNOSIS — M9903 Segmental and somatic dysfunction of lumbar region: Secondary | ICD-10-CM | POA: Diagnosis not present

## 2018-07-04 DIAGNOSIS — M9901 Segmental and somatic dysfunction of cervical region: Secondary | ICD-10-CM | POA: Diagnosis not present

## 2018-07-04 DIAGNOSIS — M9902 Segmental and somatic dysfunction of thoracic region: Secondary | ICD-10-CM | POA: Diagnosis not present

## 2018-07-04 DIAGNOSIS — M9905 Segmental and somatic dysfunction of pelvic region: Secondary | ICD-10-CM | POA: Diagnosis not present

## 2018-07-04 DIAGNOSIS — M5136 Other intervertebral disc degeneration, lumbar region: Secondary | ICD-10-CM | POA: Diagnosis not present

## 2018-07-06 ENCOUNTER — Telehealth: Payer: 59 | Admitting: Nurse Practitioner

## 2018-07-06 DIAGNOSIS — R05 Cough: Secondary | ICD-10-CM

## 2018-07-06 DIAGNOSIS — R059 Cough, unspecified: Secondary | ICD-10-CM

## 2018-07-06 MED ORDER — BENZONATATE 100 MG PO CAPS: 100.0000 mg | ORAL_CAPSULE | Freq: Three times a day (TID) | ORAL | 0 refills | Status: DC | PRN

## 2018-07-06 NOTE — Progress Notes (Signed)

## 2018-07-09 ENCOUNTER — Encounter: Payer: Self-pay | Admitting: Family Medicine

## 2018-07-09 ENCOUNTER — Other Ambulatory Visit: Payer: Self-pay | Admitting: Family Medicine

## 2018-07-09 MED ORDER — PREDNISONE 20 MG PO TABS: 20.0000 mg | ORAL_TABLET | Freq: Two times a day (BID) | ORAL | 0 refills | Status: DC

## 2018-07-10 MED FILL — predniSONE 20 MG TABS: 20 | 5 days supply | Qty: 10 | Fill #0

## 2018-07-13 ENCOUNTER — Other Ambulatory Visit: Payer: Self-pay | Admitting: Family Medicine

## 2018-07-16 ENCOUNTER — Encounter: Payer: Self-pay | Admitting: Family Medicine

## 2018-07-16 ENCOUNTER — Other Ambulatory Visit: Payer: Self-pay

## 2018-07-16 DIAGNOSIS — R7303 Prediabetes: Secondary | ICD-10-CM

## 2018-07-16 DIAGNOSIS — E559 Vitamin D deficiency, unspecified: Secondary | ICD-10-CM

## 2018-07-16 MED ORDER — FENOFIBRATE MICRONIZED 134 MG PO CAPS: ORAL_CAPSULE | ORAL | 2 refills | Status: DC

## 2018-07-16 MED ORDER — FUROSEMIDE 20 MG PO TABS: 20.0000 mg | ORAL_TABLET | Freq: Every day | ORAL | 0 refills | Status: DC | PRN

## 2018-07-16 MED ORDER — SPIRONOLACTONE 25 MG PO TABS: 50.0000 mg | ORAL_TABLET | Freq: Every day | ORAL | 2 refills | Status: DC

## 2018-07-17 ENCOUNTER — Other Ambulatory Visit: Payer: Self-pay

## 2018-07-17 MED FILL — FUROSEMIDE 20 MG TABS: 20 | 90 days supply | Qty: 180 | Fill #0

## 2018-07-17 MED FILL — SPIRONOLACTONE 25 MG TABLET: 25 | 90 days supply | Qty: 180 | Fill #0

## 2018-07-17 MED FILL — FENOFIBRATE 134 MG CAPSULE: 134 | 90 days supply | Qty: 90 | Fill #0

## 2018-07-23 ENCOUNTER — Telehealth: Payer: Self-pay | Admitting: Pulmonary Disease

## 2018-07-23 MED ORDER — MODAFINIL 200 MG PO TABS: 400.0000 mg | ORAL_TABLET | Freq: Every day | ORAL | 1 refills | Status: DC

## 2018-07-23 MED FILL — MODAFINIL 200 MG TABS: 200 | 45 days supply | Qty: 90 | Fill #0

## 2018-07-23 NOTE — Addendum Note (Signed)
Addended by: Lorretta Harp on: 07/23/2018 03:17 PM   Modules accepted: Orders

## 2018-07-23 NOTE — Telephone Encounter (Signed)
Provider: Dr. Halford Chessman  Pt requesting refill off Modafinil. Last refill 03/06/18  Instructions      Return in about 1 year (around 08/07/2018).  Provigil 400 mg daily  Follow up in 1 year       After Visit Summary (Printed 08/06/2017)

## 2018-07-23 NOTE — Telephone Encounter (Addendum)
Called and spoke with pt stating to her that Aaron Edelman said it was fine for [place] to refill her modafinil and also stated to her that we needed to schedule a televisit for her April 2020. Pt expressed understandng. televisit has been scheduled for pt with SG Wed. 4/15 at 10:30.  Tried sending Rx to pharmacy for pt but it printed as I think I forgot to change it from print which was previously done to normal so tried again and this time I had it as normal but it still printed. I tried to call pharmacy to give a verbal of pt's med but I was on hold for 14 minutes so hung up.  Aaron Edelman, can you please e-sign for pt's modafinil? Thanks!

## 2018-07-23 NOTE — Telephone Encounter (Signed)
Ok to refill.   Pt needs televisit in [place] FNP

## 2018-07-23 NOTE — Telephone Encounter (Signed)
The medication was sent in electronically.  Nothing further needed at this time  Wyn Quaker, FNP

## 2018-07-23 NOTE — Telephone Encounter (Signed)
Med sent in. Nothing further needed.  [name]

## 2018-07-23 NOTE — Addendum Note (Signed)
Addended by: Lauraine Rinne on: 07/23/2018 03:39 PM   Modules accepted: Orders

## 2018-07-24 MED ORDER — VITAMIN D (ERGOCALCIFEROL) 1.25 MG (50000 UNIT) PO CAPS: ORAL_CAPSULE | ORAL | 1 refills | Status: DC

## 2018-07-24 MED ORDER — METFORMIN HCL 1000 MG PO TABS: 1000.0000 mg | ORAL_TABLET | Freq: Every day | ORAL | 1 refills | Status: DC

## 2018-07-24 MED FILL — metFORMIN HCL 1000 MG TABS: 1000 | 90 days supply | Qty: 90 | Fill #0

## 2018-07-24 NOTE — Addendum Note (Signed)
Addended by: Kelle Darting A on: 07/24/2018 09:49 AM   Modules accepted: Orders

## 2018-07-26 MED FILL — VIT D2 1.25 MG (50,000 UNIT: 1.25 MG | 84 days supply | Qty: 12 | Fill #0

## 2018-07-30 ENCOUNTER — Encounter: Payer: Self-pay | Admitting: Family Medicine

## 2018-07-30 MED ORDER — SYNTHROID 100 MCG PO TABS: 100.0000 ug | ORAL_TABLET | Freq: Every day | ORAL | 1 refills | Status: DC

## 2018-07-30 MED FILL — SYNTHROID 100 MCG TABLET: 100 | 90 days supply | Qty: 90 | Fill #0

## 2018-08-07 ENCOUNTER — Other Ambulatory Visit: Payer: Self-pay

## 2018-08-07 ENCOUNTER — Ambulatory Visit (INDEPENDENT_AMBULATORY_CARE_PROVIDER_SITE_OTHER): Payer: 59 | Admitting: Acute Care

## 2018-08-07 ENCOUNTER — Encounter: Payer: Self-pay | Admitting: Acute Care

## 2018-08-07 DIAGNOSIS — Z9989 Dependence on other enabling machines and devices: Secondary | ICD-10-CM

## 2018-08-07 DIAGNOSIS — G4733 Obstructive sleep apnea (adult) (pediatric): Secondary | ICD-10-CM | POA: Diagnosis not present

## 2018-08-07 NOTE — Patient Instructions (Signed)
  It was great talking to  you today. You are doing a great job with your CPAP therapy Continue on CPAP at bedtime. You appear to be benefiting from the treatment Goal is to wear for at least 6 hours each night for maximal clinical benefit.>> You are doing well here!! Continue to work on weight loss, as the link between excess weight  and sleep apnea is well established.  Do not drive if sleepy. Continue your Provigil 400 mg as prescribed. Remember to clean mask, tubing, filter, and reservoir once weekly with soapy water.  Please check your Blood Pressure  when you have a headache to ensure your it  is well controlled. Follow up with 6 monthswith  Dr. Halford Chessman or Judson Roch NP  Please contact office for sooner follow up if symptoms do not improve or worsen or seek emergency care    Continue to self isolate, wash hand frequently, and wear a face mask if you leave your home. Remember to utilize the early hours at grocery stores/ drug stores  for people > 80 years old or any underlying health risks, as the stores are cleaner and less crowded at these times.

## 2018-08-07 NOTE — Progress Notes (Signed)
Virtual Visit via Telephone Note  I connected with [name] on 08/07/18 at 10:30 AM EDT by telephone and verified that I am speaking with the correct person using two identifiers.   I discussed the limitations, risks, security and privacy concerns of performing an evaluation and management service by telephone and the availability of in person appointments. I also discussed with the patient that there may be a patient responsible charge related to this service. The patient expressed understanding and agreed to proceed.  I  confirmed date of birth and address to authenticate patient Identity. My nurse Quentin Ore reviewed medications and ordered any refills required.  Synopsis [name] is a 58 y.o. female former smoker, Quit 2005 with obstructive sleep apnea and CPAP use.She takes Provigil 400 mg daily. She is followed by Dr. Halford Chessman.   History of Present Illness: Pt. Presents via telephone visit for annual check for OSA and CPAP use. She states she  been doing well. She states she is using her Provigil 400 mg daily when she works. She has noted the increase from 300 to 400 mg over the last year has been very helpful.She is compliant with her CPAP 100% of the time. She has no issues with her Equipment. She has no equipment needs. Review of her Down Load shows 100% compliance, with AHI of 0.3. She states she occasionally does have a morning headache. She stated it clears easily. I have asked her to check her BP when she awakens with a headache so we can ensure her BP is not the cause.   Down Load:07/08/2018-08/06/2018 AirSense 10 AutoSet 5-20 cm H2O 30/30 days or 100% Average use 8 hours 9 minutes Median pressure 9.6 cm H2O Max pressure 14.0 cm H2O AHI 0.3     Observations/Objective: Pulmonary tests: PFT 02/25/16 >> FEV1 2.36 (107%), FEV1% 88, TLC 4.02 (79%), DLCO 76%  Sleep tests: PSG 08/12/06 >> AHI 5, SpO2 85% HST 2013 >> AHI 12 Auto CPAP 06/1317 to 08/02/17 >> used on 30  of 30 nights with average 8 hrs 16 hrs.  Average AHI 0.9 with median CPAP 10 and 95 th percentile CPAP 14 cm H2O  Cardiac tests: Echo 01/21/16 >> mild LVH, EF 55 to 60%, grade 1 DD  Assessment and Plan: OSA/ CPAP Therapy Compliant 100% nights last 30 days Good Control Wears > 8 hours per night AHI is 0.3 Plan: You are doing a great job with your CPAP therapy Continue on CPAP at bedtime. You appear to be benefiting from the treatment Goal is to wear for at least 6 hours each night for maximal clinical benefit.>> You are doing well here!! Continue to work on weight loss, as the link between excess weight  and sleep apnea is well established.  Do not drive if sleepy. Continue your Provigil 400 mg as prescribed. Remember to clean mask, tubing, filter, and reservoir once weekly with soapy water.  Please check your Blood Pressure  when you have a headache to ensure your it  is well controlled. Follow up with 6 monthswith  Dr. Halford Chessman or Judson Roch NP  Please contact office for sooner follow up if symptoms do not improve or worsen or seek emergency care    COVID Precautions Continue to self isolate, wash hand frequently, and wear a face mask if you leave your home. Remember to utilize the early hours at grocery stores/ drug stores  for people > 35 years old or any underlying health risks, as the stores are cleaner  and less crowded at these times.  Follow Up Instructions: Follow up in 6 months with Dr. Halford Chessman or Judson Roch NP Then we will get you back to your once a year schedule    I discussed the assessment and treatment plan with the patient. The patient was provided an opportunity to ask questions and all were answered. The patient agreed with the plan and demonstrated an understanding of the instructions.   The patient was advised to call back or seek an in-person evaluation if the symptoms worsen or if the condition fails to improve as anticipated.  I provided 23  minutes of non-face-to-face  time during this encounter.   Magdalen Spatz, NP 08/07/2018 1:22 PM

## 2018-08-13 ENCOUNTER — Ambulatory Visit: Payer: Self-pay | Admitting: Pulmonary Disease

## 2018-08-14 DIAGNOSIS — G4733 Obstructive sleep apnea (adult) (pediatric): Secondary | ICD-10-CM | POA: Diagnosis not present

## 2018-08-14 DIAGNOSIS — I10 Essential (primary) hypertension: Secondary | ICD-10-CM | POA: Diagnosis not present

## 2018-08-30 DIAGNOSIS — M9901 Segmental and somatic dysfunction of cervical region: Secondary | ICD-10-CM | POA: Diagnosis not present

## 2018-08-30 DIAGNOSIS — M9903 Segmental and somatic dysfunction of lumbar region: Secondary | ICD-10-CM | POA: Diagnosis not present

## 2018-08-30 DIAGNOSIS — M5136 Other intervertebral disc degeneration, lumbar region: Secondary | ICD-10-CM | POA: Diagnosis not present

## 2018-08-30 DIAGNOSIS — M9902 Segmental and somatic dysfunction of thoracic region: Secondary | ICD-10-CM | POA: Diagnosis not present

## 2018-08-30 DIAGNOSIS — M9905 Segmental and somatic dysfunction of pelvic region: Secondary | ICD-10-CM | POA: Diagnosis not present

## 2018-08-30 DIAGNOSIS — M62830 Muscle spasm of back: Secondary | ICD-10-CM | POA: Diagnosis not present

## 2018-09-04 DIAGNOSIS — G5621 Lesion of ulnar nerve, right upper limb: Secondary | ICD-10-CM | POA: Diagnosis not present

## 2018-09-04 DIAGNOSIS — G5601 Carpal tunnel syndrome, right upper limb: Secondary | ICD-10-CM | POA: Diagnosis not present

## 2018-09-10 ENCOUNTER — Other Ambulatory Visit: Payer: Self-pay

## 2018-09-10 ENCOUNTER — Ambulatory Visit
Admission: RE | Admit: 2018-09-10 | Discharge: 2018-09-10 | Disposition: A | Discharge status: Home / Self Care | Payer: 59 | Source: Ambulatory Visit | Attending: Family Medicine | Admitting: Family Medicine

## 2018-09-10 DIAGNOSIS — Z1231 Encounter for screening mammogram for malignant neoplasm of breast: Secondary | ICD-10-CM

## 2018-09-24 ENCOUNTER — Encounter: Payer: Self-pay | Admitting: Family Medicine

## 2018-09-26 DIAGNOSIS — M9905 Segmental and somatic dysfunction of pelvic region: Secondary | ICD-10-CM | POA: Diagnosis not present

## 2018-09-26 DIAGNOSIS — M5136 Other intervertebral disc degeneration, lumbar region: Secondary | ICD-10-CM | POA: Diagnosis not present

## 2018-09-26 DIAGNOSIS — M62830 Muscle spasm of back: Secondary | ICD-10-CM | POA: Diagnosis not present

## 2018-09-26 DIAGNOSIS — M9901 Segmental and somatic dysfunction of cervical region: Secondary | ICD-10-CM | POA: Diagnosis not present

## 2018-09-26 DIAGNOSIS — M9903 Segmental and somatic dysfunction of lumbar region: Secondary | ICD-10-CM | POA: Diagnosis not present

## 2018-09-26 DIAGNOSIS — M9902 Segmental and somatic dysfunction of thoracic region: Secondary | ICD-10-CM | POA: Diagnosis not present

## 2018-09-26 MED FILL — IPRATROPIUM 0.06% SPRAY: 0.06 | 14 days supply | Qty: 15 | Fill #0

## 2018-10-11 DIAGNOSIS — R200 Anesthesia of skin: Secondary | ICD-10-CM | POA: Diagnosis not present

## 2018-10-22 ENCOUNTER — Other Ambulatory Visit: Payer: Self-pay | Admitting: Family Medicine

## 2018-10-22 ENCOUNTER — Encounter: Payer: Self-pay | Admitting: Family Medicine

## 2018-10-22 MED ORDER — TRULANCE 3 MG PO TABS: 3.0000 mg | ORAL_TABLET | Freq: Every day | ORAL | 2 refills | Status: DC

## 2018-10-23 ENCOUNTER — Other Ambulatory Visit: Payer: Self-pay | Admitting: Family Medicine

## 2018-10-23 MED ORDER — LINACLOTIDE 145 MCG PO CAPS: 145.0000 ug | ORAL_CAPSULE | Freq: Every day | ORAL | 5 refills | Status: DC

## 2018-10-24 MED FILL — LINZESS 145 MCG CAPSULE: 145 | 30 days supply | Qty: 30 | Fill #0

## 2018-10-31 DIAGNOSIS — M9905 Segmental and somatic dysfunction of pelvic region: Secondary | ICD-10-CM | POA: Diagnosis not present

## 2018-10-31 DIAGNOSIS — M9902 Segmental and somatic dysfunction of thoracic region: Secondary | ICD-10-CM | POA: Diagnosis not present

## 2018-10-31 DIAGNOSIS — M62830 Muscle spasm of back: Secondary | ICD-10-CM | POA: Diagnosis not present

## 2018-10-31 DIAGNOSIS — M5136 Other intervertebral disc degeneration, lumbar region: Secondary | ICD-10-CM | POA: Diagnosis not present

## 2018-10-31 DIAGNOSIS — M9901 Segmental and somatic dysfunction of cervical region: Secondary | ICD-10-CM | POA: Diagnosis not present

## 2018-10-31 DIAGNOSIS — M9903 Segmental and somatic dysfunction of lumbar region: Secondary | ICD-10-CM | POA: Diagnosis not present

## 2018-11-14 MED FILL — IPRATROPIUM 0.06% SPRAY: 0.06 | 14 days supply | Qty: 15 | Fill #1

## 2018-11-14 MED FILL — VIT D2 1.25 MG (50,000 UNIT: 1.25 MG | 84 days supply | Qty: 12 | Fill #0

## 2018-11-14 MED FILL — MODAFINIL 200 MG TABLET: 200 | 45 days supply | Qty: 90 | Fill #0

## 2018-11-15 MED FILL — SYNTHROID 100 MCG TABLET: 100 | 90 days supply | Qty: 90 | Fill #0

## 2018-11-27 DIAGNOSIS — R200 Anesthesia of skin: Secondary | ICD-10-CM | POA: Diagnosis not present

## 2018-11-27 DIAGNOSIS — M5412 Radiculopathy, cervical region: Secondary | ICD-10-CM | POA: Diagnosis not present

## 2018-11-28 MED FILL — LINZESS 145 MCG CAPSULE: 145 | 30 days supply | Qty: 30 | Fill #1

## 2018-12-05 DIAGNOSIS — M9903 Segmental and somatic dysfunction of lumbar region: Secondary | ICD-10-CM | POA: Diagnosis not present

## 2018-12-05 DIAGNOSIS — M5136 Other intervertebral disc degeneration, lumbar region: Secondary | ICD-10-CM | POA: Diagnosis not present

## 2018-12-05 DIAGNOSIS — M9902 Segmental and somatic dysfunction of thoracic region: Secondary | ICD-10-CM | POA: Diagnosis not present

## 2018-12-05 DIAGNOSIS — M9901 Segmental and somatic dysfunction of cervical region: Secondary | ICD-10-CM | POA: Diagnosis not present

## 2018-12-05 DIAGNOSIS — M9905 Segmental and somatic dysfunction of pelvic region: Secondary | ICD-10-CM | POA: Diagnosis not present

## 2018-12-05 DIAGNOSIS — M62830 Muscle spasm of back: Secondary | ICD-10-CM | POA: Diagnosis not present

## 2018-12-09 ENCOUNTER — Other Ambulatory Visit: Payer: Self-pay | Admitting: Family Medicine

## 2018-12-09 ENCOUNTER — Encounter: Payer: Self-pay | Admitting: Family Medicine

## 2018-12-09 DIAGNOSIS — E039 Hypothyroidism, unspecified: Secondary | ICD-10-CM

## 2018-12-09 DIAGNOSIS — E559 Vitamin D deficiency, unspecified: Secondary | ICD-10-CM

## 2018-12-09 DIAGNOSIS — R300 Dysuria: Secondary | ICD-10-CM

## 2018-12-09 DIAGNOSIS — E1169 Type 2 diabetes mellitus with other specified complication: Secondary | ICD-10-CM

## 2018-12-09 DIAGNOSIS — M791 Myalgia, unspecified site: Secondary | ICD-10-CM

## 2018-12-09 DIAGNOSIS — E782 Mixed hyperlipidemia: Secondary | ICD-10-CM

## 2018-12-09 DIAGNOSIS — R252 Cramp and spasm: Secondary | ICD-10-CM

## 2018-12-09 DIAGNOSIS — R109 Unspecified abdominal pain: Secondary | ICD-10-CM

## 2018-12-09 DIAGNOSIS — I10 Essential (primary) hypertension: Secondary | ICD-10-CM

## 2018-12-10 DIAGNOSIS — R200 Anesthesia of skin: Secondary | ICD-10-CM | POA: Diagnosis not present

## 2018-12-10 DIAGNOSIS — M5023 Other cervical disc displacement, cervicothoracic region: Secondary | ICD-10-CM | POA: Diagnosis not present

## 2018-12-10 DIAGNOSIS — M5021 Other cervical disc displacement,  high cervical region: Secondary | ICD-10-CM | POA: Diagnosis not present

## 2018-12-10 DIAGNOSIS — M5412 Radiculopathy, cervical region: Secondary | ICD-10-CM | POA: Diagnosis not present

## 2018-12-10 DIAGNOSIS — M50322 Other cervical disc degeneration at C5-C6 level: Secondary | ICD-10-CM | POA: Diagnosis not present

## 2018-12-10 DIAGNOSIS — M2578 Osteophyte, vertebrae: Secondary | ICD-10-CM | POA: Diagnosis not present

## 2018-12-10 NOTE — Telephone Encounter (Signed)
Patient is complaining of muscles cramps and wanted to know if we could add on a ck to her future labs.  She will be in on Thursday.  Her ck that we done 2 years ago was elevated and was also elevated in the past.

## 2018-12-12 ENCOUNTER — Other Ambulatory Visit (INDEPENDENT_AMBULATORY_CARE_PROVIDER_SITE_OTHER): Payer: 59

## 2018-12-12 ENCOUNTER — Other Ambulatory Visit: Payer: Self-pay

## 2018-12-12 DIAGNOSIS — M791 Myalgia, unspecified site: Secondary | ICD-10-CM

## 2018-12-12 DIAGNOSIS — R252 Cramp and spasm: Secondary | ICD-10-CM

## 2018-12-12 DIAGNOSIS — E559 Vitamin D deficiency, unspecified: Secondary | ICD-10-CM | POA: Diagnosis not present

## 2018-12-12 DIAGNOSIS — E669 Obesity, unspecified: Secondary | ICD-10-CM

## 2018-12-12 DIAGNOSIS — I10 Essential (primary) hypertension: Secondary | ICD-10-CM

## 2018-12-12 DIAGNOSIS — E1169 Type 2 diabetes mellitus with other specified complication: Secondary | ICD-10-CM | POA: Diagnosis not present

## 2018-12-12 DIAGNOSIS — E039 Hypothyroidism, unspecified: Secondary | ICD-10-CM

## 2018-12-12 DIAGNOSIS — R109 Unspecified abdominal pain: Secondary | ICD-10-CM | POA: Diagnosis not present

## 2018-12-12 DIAGNOSIS — E782 Mixed hyperlipidemia: Secondary | ICD-10-CM

## 2018-12-12 LAB — CBC WITH DIFFERENTIAL/PLATELET
Basophils Absolute: 0 10*3/uL (ref 0.0–0.1)
Basophils Relative: 0.8 % (ref 0.0–3.0)
Eosinophils Absolute: 0.1 10*3/uL (ref 0.0–0.7)
Eosinophils Relative: 2.4 % (ref 0.0–5.0)
HCT: 44.3 % (ref 36.0–46.0)
Hemoglobin: 14.7 g/dL (ref 12.0–15.0)
Lymphocytes Relative: 48.9 % — ABNORMAL HIGH (ref 12.0–46.0)
Lymphs Abs: 1.9 10*3/uL (ref 0.7–4.0)
MCHC: 33.2 g/dL (ref 30.0–36.0)
MCV: 89.2 fl (ref 78.0–100.0)
Monocytes Absolute: 0.4 10*3/uL (ref 0.1–1.0)
Monocytes Relative: 9.2 % (ref 3.0–12.0)
Neutro Abs: 1.5 10*3/uL (ref 1.4–7.7)
Neutrophils Relative %: 38.7 % — ABNORMAL LOW (ref 43.0–77.0)
Platelets: 352 10*3/uL (ref 150.0–400.0)
RBC: 4.97 Mil/uL (ref 3.87–5.11)
RDW: 13.8 % (ref 11.5–15.5)
WBC: 3.9 10*3/uL — ABNORMAL LOW (ref 4.0–10.5)

## 2018-12-12 LAB — LIPID PANEL
Cholesterol: 203 mg/dL — ABNORMAL HIGH (ref 0–200)
HDL: 46.5 mg/dL (ref >39.00)
LDL Cholesterol: 136 mg/dL — ABNORMAL HIGH (ref 0–99)
NonHDL: 156.17
Total CHOL/HDL Ratio: 4
Triglycerides: 99 mg/dL (ref 0.0–149.0)
VLDL: 19.8 mg/dL (ref 0.0–40.0)

## 2018-12-12 LAB — CK: Total CK: 253 U/L — ABNORMAL HIGH (ref 7–177)

## 2018-12-12 LAB — COMPREHENSIVE METABOLIC PANEL
ALT: 17 U/L (ref 0–35)
AST: 19 U/L (ref 0–37)
Albumin: 4.8 g/dL (ref 3.5–5.2)
Alkaline Phosphatase: 59 U/L (ref 39–117)
BUN: 12 mg/dL (ref 6–23)
CO2: 24 mEq/L (ref 19–32)
Calcium: 9.8 mg/dL (ref 8.4–10.5)
Chloride: 103 mEq/L (ref 96–112)
Creatinine, Ser: 0.95 mg/dL (ref 0.40–1.20)
GFR: 73.1 mL/min (ref >60.00)
Glucose, Bld: 97 mg/dL (ref 70–99)
Potassium: 3.9 mEq/L (ref 3.5–5.1)
Sodium: 140 mEq/L (ref 135–145)
Total Bilirubin: 0.4 mg/dL (ref 0.2–1.2)
Total Protein: 7.2 g/dL (ref 6.0–8.3)

## 2018-12-12 LAB — URINALYSIS
Bilirubin Urine: NEGATIVE
Hgb urine dipstick: NEGATIVE
Ketones, ur: NEGATIVE
Leukocytes,Ua: NEGATIVE
Nitrite: NEGATIVE
Specific Gravity, Urine: 1.01 (ref 1.000–1.030)
Total Protein, Urine: NEGATIVE
Urine Glucose: NEGATIVE
Urobilinogen, UA: 0.2 (ref 0.0–1.0)
pH: 6 (ref 5.0–8.0)

## 2018-12-12 LAB — HEMOGLOBIN A1C: Hgb A1c MFr Bld: 6 % (ref 4.6–6.5)

## 2018-12-12 LAB — VITAMIN D 25 HYDROXY (VIT D DEFICIENCY, FRACTURES): VITD: 42.95 ng/mL (ref 30.00–100.00)

## 2018-12-12 LAB — MAGNESIUM: Magnesium: 1.9 mg/dL (ref 1.5–2.5)

## 2018-12-12 LAB — TSH: TSH: 0.68 u[IU]/mL (ref 0.35–4.50)

## 2018-12-14 LAB — URINE CULTURE
MICRO NUMBER:: 793454
SPECIMEN QUALITY:: ADEQUATE

## 2018-12-16 ENCOUNTER — Telehealth: Payer: 59 | Admitting: Physician Assistant

## 2018-12-16 ENCOUNTER — Encounter: Payer: Self-pay | Admitting: Physician Assistant

## 2018-12-16 ENCOUNTER — Encounter: Payer: Self-pay | Admitting: Family Medicine

## 2018-12-16 DIAGNOSIS — M545 Low back pain, unspecified: Secondary | ICD-10-CM

## 2018-12-16 MED ORDER — CYCLOBENZAPRINE HCL 10 MG PO TABS: 10.0000 mg | ORAL_TABLET | Freq: Three times a day (TID) | ORAL | 0 refills | Status: DC | PRN

## 2018-12-16 NOTE — Progress Notes (Signed)
Called pharmacy and cancelled medication.  °

## 2018-12-16 NOTE — Progress Notes (Signed)
Based on what you shared with me, I feel your condition warrants further evaluation and I recommend that you be seen for a face to face office visit.  NOTE: If you entered your credit card information for this eVisit, you will not be charged. You may see a "hold" on your card for the $35 but that hold will drop off and you will not have a charge processed.  If you are having a true medical emergency please call 911.     Evisit cancelled per pt's request  For an urgent face to face visit, [facility] has five urgent care centers for your convenience:   [email or web address] to reserve your spot online an avoid wait times  [place], [place], [place] [address] *Just off [address], across the road from [place] hours of operation: Monday-Friday, 12 PM to 6 PM  Closed Saturday & Sunday    . [facility]    [phone]                  Get Driving Directions  [id number] [place], [place] [address] . 10 am to 8 pm Monday-Friday . 12 pm to 8 pm Saturday-Sunday   . [facility] at [place]                  Get Driving Directions  [id number] [place], [place], [place] [address] . 8 am to 8 pm Monday-Friday . 9 am to 6 pm Saturday . 11 am to 6 pm Sunday   . [facility] at [place]                  Get Driving Directions   [address] [place], [place] [address] . 8 am to 8 pm Monday-Friday . 8 am to 4 pm Saturday-Sunday    . [facility] at [place]                    Get Driving Directions  [id number]  [address], [place],  [place] [address]  . Monday-Friday, 12 PM to 6 PM    Your e-visit answers were reviewed by a board certified advanced clinical practitioner to complete your personal care plan.  Thank you for using e-Visits. I spent 5-10 minutes on  review and completion of this note- Lacy Duverney [facility]

## 2018-12-16 NOTE — Progress Notes (Signed)
We are sorry that you are not feeling well.  Here is how we plan to help!  Based on what you have shared with me it looks like you mostly have acute back pain.  Acute back pain is defined as musculoskeletal pain that can resolve in 1-3 weeks with conservative treatment.  I have prescribed Flexeril 10 mg every eight hours as needed which is a muscle relaxer  Some patients experience stomach irritation or in increased heartburn with anti-inflammatory drugs.  Please keep in mind that muscle relaxer's can cause fatigue and should not be taken while at work or driving.  Back pain is very common.  The pain often gets better over time.  The cause of back pain is usually not dangerous.  Most people can learn to manage their back pain on their own.  Ms. Cockman,  Please speak to your physician regarding results of your MRI.  You may take OTC Tylenol, follow instructions on package for dosage  Home Care  Stay active.  Start with short walks on flat ground if you can.  Try to walk farther each day.  Do not sit, drive or stand in one place for more than 30 minutes.  Do not stay in bed.  Do not avoid exercise or work.  Activity can help your back heal faster.  Be careful when you bend or lift an object.  Bend at your knees, keep the object close to you, and do not twist.  Sleep on a firm mattress.  Lie on your side, and bend your knees.  If you lie on your back, put a pillow under your knees.  Only take medicines as told by your doctor.  Put ice on the injured area.  Put ice in a plastic bag  Place a towel between your skin and the bag  Leave the ice on for 15-20 minutes, 3-4 times a day for the first 2-3 days. 210 After that, you can switch between ice and heat packs.  Ask your doctor about back exercises or massage.  Avoid feeling anxious or stressed.  Find good ways to deal with stress, such as exercise.  Get Help Right Way If:  Your pain does not go away with rest or medicine.  Your  pain does not go away in 1 week.  You have new problems.  You do not feel well.  The pain spreads into your legs.  You cannot control when you poop (bowel movement) or pee (urinate)  You feel sick to your stomach (nauseous) or throw up (vomit)  You have belly (abdominal) pain.  You feel like you may pass out (faint).  If you develop a fever.  Make Sure you:  Understand these instructions.  Will watch your condition  Will get help right away if you are not doing well or get worse.  Your e-visit answers were reviewed by a board certified advanced clinical practitioner to complete your personal care plan.  Depending on the condition, your plan could have included both over the counter or prescription medications.  If there is a problem please reply  once you have received a response from your provider.  Your safety is important to [place].  If you have drug allergies check your prescription carefully.    You can use MyChart to ask questions about today's visit, request a non-urgent call back, or ask for a work or school excuse for 24 hours related to this e-Visit. If it has been greater than 24 hours you will  need to follow up with your provider, or enter a new e-Visit to address those concerns.  You will get an e-mail in the next two days asking about your experience.  I hope that your e-visit has been valuable and will speed your recovery. Thank you for using e-visits.  I spent 5-10 minutes on review and completion of this note- [facility]

## 2018-12-16 NOTE — Addendum Note (Signed)
Addended by: Waldon Merl on: 12/16/2018 12:38 PM   Modules accepted: Orders

## 2018-12-18 DIAGNOSIS — M47812 Spondylosis without myelopathy or radiculopathy, cervical region: Secondary | ICD-10-CM | POA: Diagnosis not present

## 2018-12-18 DIAGNOSIS — M5412 Radiculopathy, cervical region: Secondary | ICD-10-CM | POA: Diagnosis not present

## 2018-12-18 DIAGNOSIS — G5601 Carpal tunnel syndrome, right upper limb: Secondary | ICD-10-CM | POA: Diagnosis not present

## 2018-12-18 MED FILL — MELOXICAM 15 MG TABLET: 15 | 30 days supply | Qty: 30 | Fill #0

## 2018-12-18 MED FILL — GABAPENTIN 300 MG CAPSULE: 300 | 30 days supply | Qty: 90 | Fill #0

## 2018-12-19 DIAGNOSIS — M9903 Segmental and somatic dysfunction of lumbar region: Secondary | ICD-10-CM | POA: Diagnosis not present

## 2018-12-19 DIAGNOSIS — M9901 Segmental and somatic dysfunction of cervical region: Secondary | ICD-10-CM | POA: Diagnosis not present

## 2018-12-19 DIAGNOSIS — M62830 Muscle spasm of back: Secondary | ICD-10-CM | POA: Diagnosis not present

## 2018-12-19 DIAGNOSIS — M5136 Other intervertebral disc degeneration, lumbar region: Secondary | ICD-10-CM | POA: Diagnosis not present

## 2018-12-19 DIAGNOSIS — M9905 Segmental and somatic dysfunction of pelvic region: Secondary | ICD-10-CM | POA: Diagnosis not present

## 2018-12-19 DIAGNOSIS — M9902 Segmental and somatic dysfunction of thoracic region: Secondary | ICD-10-CM | POA: Diagnosis not present

## 2018-12-20 MED FILL — metFORMIN HCL 1000 MG TABS: 1000 | 90 days supply | Qty: 90 | Fill #0

## 2018-12-20 MED FILL — FUROSEMIDE 20 MG TABS: 20 | 20 days supply | Qty: 40 | Fill #0

## 2018-12-20 MED FILL — FENOFIBRATE 134 MG CAPSULE: 134 | 90 days supply | Qty: 90 | Fill #0

## 2018-12-20 MED FILL — BYSTOLIC 20 MG TABLET: 20 | 90 days supply | Qty: 90 | Fill #2

## 2018-12-20 MED FILL — SPIRONOLACTONE 25 MG TABLET: 25 | 90 days supply | Qty: 180 | Fill #0

## 2018-12-23 ENCOUNTER — Other Ambulatory Visit: Payer: Self-pay | Admitting: Family Medicine

## 2018-12-23 ENCOUNTER — Encounter: Payer: Self-pay | Admitting: Family Medicine

## 2018-12-23 DIAGNOSIS — R109 Unspecified abdominal pain: Secondary | ICD-10-CM

## 2018-12-23 MED ORDER — HYOSCYAMINE SULFATE 0.125 MG SL SUBL: 0.1250 mg | SUBLINGUAL_TABLET | SUBLINGUAL | 1 refills | Status: DC | PRN

## 2018-12-24 MED FILL — HYOSCYAMINE 0.125 MG TAB SL: 0.125 | 7 days supply | Qty: 40 | Fill #0

## 2018-12-25 MED FILL — LINZESS 145 MCG CAPSULE: 145 | 30 days supply | Qty: 30 | Fill #2

## 2018-12-26 DIAGNOSIS — G4733 Obstructive sleep apnea (adult) (pediatric): Secondary | ICD-10-CM | POA: Diagnosis not present

## 2018-12-26 DIAGNOSIS — I10 Essential (primary) hypertension: Secondary | ICD-10-CM | POA: Diagnosis not present

## 2019-01-09 DIAGNOSIS — M9903 Segmental and somatic dysfunction of lumbar region: Secondary | ICD-10-CM | POA: Diagnosis not present

## 2019-01-09 DIAGNOSIS — M62830 Muscle spasm of back: Secondary | ICD-10-CM | POA: Diagnosis not present

## 2019-01-09 DIAGNOSIS — M9902 Segmental and somatic dysfunction of thoracic region: Secondary | ICD-10-CM | POA: Diagnosis not present

## 2019-01-09 DIAGNOSIS — M9905 Segmental and somatic dysfunction of pelvic region: Secondary | ICD-10-CM | POA: Diagnosis not present

## 2019-01-09 DIAGNOSIS — M9901 Segmental and somatic dysfunction of cervical region: Secondary | ICD-10-CM | POA: Diagnosis not present

## 2019-01-09 DIAGNOSIS — M5136 Other intervertebral disc degeneration, lumbar region: Secondary | ICD-10-CM | POA: Diagnosis not present

## 2019-01-16 ENCOUNTER — Encounter: Payer: Self-pay | Admitting: Family Medicine

## 2019-01-16 ENCOUNTER — Other Ambulatory Visit: Payer: Self-pay

## 2019-01-16 ENCOUNTER — Ambulatory Visit (INDEPENDENT_AMBULATORY_CARE_PROVIDER_SITE_OTHER): Payer: 59 | Admitting: Family Medicine

## 2019-01-16 ENCOUNTER — Ambulatory Visit (HOSPITAL_BASED_OUTPATIENT_CLINIC_OR_DEPARTMENT_OTHER)
Admission: RE | Admit: 2019-01-16 | Discharge: 2019-01-16 | Disposition: A | Discharge status: Home / Self Care | Payer: 59 | Source: Ambulatory Visit | Attending: Family Medicine | Admitting: Family Medicine

## 2019-01-16 VITALS — BP 132/74 | HR 107 | Temp 96.4°F | Resp 18 | Wt 244.8 lb

## 2019-01-16 DIAGNOSIS — E782 Mixed hyperlipidemia: Secondary | ICD-10-CM

## 2019-01-16 DIAGNOSIS — M79601 Pain in right arm: Secondary | ICD-10-CM | POA: Insufficient documentation

## 2019-01-16 DIAGNOSIS — G4733 Obstructive sleep apnea (adult) (pediatric): Secondary | ICD-10-CM | POA: Diagnosis not present

## 2019-01-16 DIAGNOSIS — E559 Vitamin D deficiency, unspecified: Secondary | ICD-10-CM | POA: Diagnosis not present

## 2019-01-16 DIAGNOSIS — I10 Essential (primary) hypertension: Secondary | ICD-10-CM

## 2019-01-16 DIAGNOSIS — E669 Obesity, unspecified: Secondary | ICD-10-CM

## 2019-01-16 DIAGNOSIS — M25521 Pain in right elbow: Secondary | ICD-10-CM | POA: Diagnosis not present

## 2019-01-16 DIAGNOSIS — M25511 Pain in right shoulder: Secondary | ICD-10-CM | POA: Insufficient documentation

## 2019-01-16 DIAGNOSIS — Z23 Encounter for immunization: Secondary | ICD-10-CM

## 2019-01-16 DIAGNOSIS — M791 Myalgia, unspecified site: Secondary | ICD-10-CM | POA: Diagnosis not present

## 2019-01-16 DIAGNOSIS — E039 Hypothyroidism, unspecified: Secondary | ICD-10-CM

## 2019-01-16 DIAGNOSIS — K429 Umbilical hernia without obstruction or gangrene: Secondary | ICD-10-CM

## 2019-01-16 DIAGNOSIS — E1169 Type 2 diabetes mellitus with other specified complication: Secondary | ICD-10-CM

## 2019-01-16 DIAGNOSIS — Z0000 Encounter for general adult medical examination without abnormal findings: Secondary | ICD-10-CM | POA: Diagnosis not present

## 2019-01-16 DIAGNOSIS — F4323 Adjustment disorder with mixed anxiety and depressed mood: Secondary | ICD-10-CM

## 2019-01-16 DIAGNOSIS — L578 Other skin changes due to chronic exposure to nonionizing radiation: Secondary | ICD-10-CM

## 2019-01-16 DIAGNOSIS — M19011 Primary osteoarthritis, right shoulder: Secondary | ICD-10-CM | POA: Diagnosis not present

## 2019-01-16 MED ORDER — VENLAFAXINE HCL ER 75 MG PO CP24: 75.0000 mg | ORAL_CAPSULE | Freq: Every day | ORAL | 5 refills | Status: DC

## 2019-01-16 MED ORDER — GABAPENTIN 300 MG PO CAPS: 300.0000 mg | ORAL_CAPSULE | Freq: Three times a day (TID) | ORAL | 1 refills | Status: DC

## 2019-01-16 MED ORDER — VENLAFAXINE HCL ER 37.5 MG PO CP24: 37.5000 mg | ORAL_CAPSULE | Freq: Every day | ORAL | 0 refills | Status: DC

## 2019-01-16 MED ORDER — VITAMIN D (ERGOCALCIFEROL) 1.25 MG (50000 UNIT) PO CAPS: ORAL_CAPSULE | ORAL | 1 refills | Status: DC

## 2019-01-16 MED ORDER — METOPROLOL SUCCINATE ER 50 MG PO TB24: 50.0000 mg | ORAL_TABLET | Freq: Every day | ORAL | 3 refills | Status: DC

## 2019-01-16 MED FILL — VENLAFAXINE HCL ER 75 MG CA: 75 | 30 days supply | Qty: 30 | Fill #0

## 2019-01-16 MED FILL — GABAPENTIN 300 MG CAPSULE: 300 | 90 days supply | Qty: 270 | Fill #0

## 2019-01-16 MED FILL — VENLAFAXINE HCL ER 37.5 MG: 37.5 | 7 days supply | Qty: 7 | Fill #0

## 2019-01-16 MED FILL — METOPROLOL SUCCINATE ER 50: 50 | 90 days supply | Qty: 90 | Fill #0

## 2019-01-16 NOTE — Assessment & Plan Note (Signed)
Encouraged heart healthy diet, increase exercise, avoid trans fats, consider a krill oil cap daily 

## 2019-01-16 NOTE — Assessment & Plan Note (Signed)
Using CPAP 

## 2019-01-16 NOTE — Assessment & Plan Note (Addendum)
Check xray shoulder and elbow. Has a chiropractor who is working with her along with orthopaedics, xrays confirm arthritic changes but no acute concerns. She will notify us if she is ready for further referral

## 2019-01-16 NOTE — Assessment & Plan Note (Signed)
hgba1c acceptable, minimize simple carbs. Increase exercise as tolerated. Continue current meds 

## 2019-01-16 NOTE — Assessment & Plan Note (Signed)
Encouraged DASH diet, decrease po intake and increase exercise as tolerated. Needs 7-8 hours of sleep nightly. Avoid trans fats, eat small, frequent meals every 4-5 hours with lean proteins, complex carbs and healthy fats. Minimize simple carbs, consider weight watchers APP 

## 2019-01-16 NOTE — Assessment & Plan Note (Signed)
Hydrate and monitor 

## 2019-01-16 NOTE — Assessment & Plan Note (Signed)
Supplement and monitor 

## 2019-01-16 NOTE — Assessment & Plan Note (Signed)
Well controlled, no changes to meds. Encouraged heart healthy diet such as the DASH diet and exercise as tolerated.  °

## 2019-01-16 NOTE — Assessment & Plan Note (Signed)
On Levothyroxine, continue to monitor 

## 2019-01-19 DIAGNOSIS — L578 Other skin changes due to chronic exposure to nonionizing radiation: Secondary | ICD-10-CM | POA: Insufficient documentation

## 2019-01-19 DIAGNOSIS — K429 Umbilical hernia without obstruction or gangrene: Secondary | ICD-10-CM | POA: Insufficient documentation

## 2019-01-19 DIAGNOSIS — Z0000 Encounter for general adult medical examination without abnormal findings: Secondary | ICD-10-CM | POA: Insufficient documentation

## 2019-01-19 NOTE — Assessment & Plan Note (Signed)
Has worsened recently and becomes tender at times. She is advised against heavy lifting and referred to general surgery for further consideration

## 2019-01-19 NOTE — Assessment & Plan Note (Signed)
Is noting worsening symptoms will restart Effexor 37.5 mg then increase to 75 mg daily

## 2019-01-19 NOTE — Assessment & Plan Note (Signed)
Referred to dermatology for further consideration.  

## 2019-01-19 NOTE — Progress Notes (Signed)
Subjective:    Patient ID: [name], female    DOB: Mar 08, 1961, 58 y.o.   MRN: [id number]  No chief complaint on file.   HPI Patient is in today for annual preventative exam and follow up  On chronic medical concerns including diabetes, chronic pain, depression, anxiety, hypertension and more. She is worried about an increased number of skin lesions through out. She continues to have right arm pain from shoulder to elbow. No fall or trauma. No warmth, redness or swelling. No polyuria or polydipsia. No recent febrile illness or hospitalizations. She does endorse increasing anxiety and anhedonia recently. Is interested in restarting medicines. No suicidal ideation. 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 No regular exercise.   Past Medical History:  Diagnosis Date  . Acute bronchitis 05/25/2016  . Anxiety   . Chronic pain syndrome 01/06/2013  . Chronic rhinosinusitis   . Colon polyps   . Cough 01/06/2013  . Depression   . Diabetes ([place]) 01/06/2013  . Diabetes mellitus type 2 in obese [facility]) 01/06/2013   Sees Dr Calvert Cantor for eye exam Does not see podiatry, foot exam today unremarkable except for thick cracking skin on heals   . Dysuria 05/25/2016  . Edema 01/06/2013  . Epistaxis 05/25/2016  . Fibroid, uterine   . GERD (gastroesophageal reflux disease)   . Glaucoma 12-13  . Hoarseness   . Hoarseness of voice 05/11/2013  . Hyperglycemia 04/13/2013  . Hyperlipemia, mixed 06/06/2007   Qualifier: Diagnosis of  By: Wynona Luna She feels Lipitor caused increased low back pain and weakness    . Hyperlipidemia   . Hypertension   . Hypokalemia 02/05/2013  . IBS (irritable bowel syndrome) 02/13/2011  . Low back pain 03/03/2013  . Muscle cramp 05/22/2014  . Obesity   . Obesity, unspecified 05/11/2013  . OSA (obstructive sleep apnea)   . Pain in joint, shoulder region 06/27/2015  . Perimenopause 10/05/2012  . Raynaud disease 06/16/2013  . Thyroid disease    hypothyroidism  .  Vocal fold nodules     Past Surgical History:  Procedure Laterality Date  . ABDOMINAL HYSTERECTOMY    . BREAST EXCISIONAL BIOPSY Right    at age 58  . CHOLECYSTECTOMY    . CYSTECTOMY    . DILATION AND CURETTAGE OF UTERUS     x2  . I&D EXTREMITY Left 04/11/2018   Procedure: DEBIDMENT DISTAL INTERPHALANGEAL LEFT MIDDLE;  Surgeon: Daryll Brod, MD;  Location: [place];  Service: Orthopedics;  Laterality: Left;  Marland Kitchen MASS EXCISION Left 04/11/2018   Procedure: EXCISION MASS;  Surgeon: Daryll Brod, MD;  Location: [place];  Service: Orthopedics;  Laterality: Left;  . OOPHORECTOMY    . POLYPECTOMY    . ROTATOR CUFF REPAIR      Family History  Problem Relation Age of Onset  . Alcohol abuse Father   . Cancer Father        renal and colon  . Hyperlipidemia Father   . Hypertension Father   . Stroke Father   . Heart disease Father   . Cancer Paternal Grandfather        colon  . Diabetes Other   . High blood pressure Mother   . High Cholesterol Mother   . Kidney disease Mother   . Depression Mother   . Anxiety disorder Mother   . Obesity Mother   . Breast cancer Other 45  . Diabetes Sister   . Diabetes  Brother     Social History   Socioeconomic History  . Marital status: Significant Other    Spouse name: Not on file  . Number of children: 0  . Years of education: Not on file  . Highest education level: Not on file  Occupational History  . Occupation: Technical sales engineer  Social Needs  . Financial resource strain: Not on file  . Food insecurity    Worry: Not on file    Inability: Not on file  . Transportation needs    Medical: Not on file    Non-medical: Not on file  Tobacco Use  . Smoking status: Former Smoker    Packs/day: 0.50    Years: 30.00    Pack years: 15.00    Types: Cigarettes    Quit date: 04/25/2003    Years since quitting: 15.7  . Smokeless tobacco: Never Used  . Tobacco comment: smoked since age 61  Substance and  Sexual Activity  . Alcohol use: No  . Drug use: Never  . Sexual activity: Yes    Partners: Male  Lifestyle  . Physical activity    Days per week: Not on file    Minutes per session: Not on file  . Stress: Not on file  Relationships  . Social Herbalist on phone: Not on file    Gets together: Not on file    Attends religious service: Not on file    Active member of club or organization: Not on file    Attends meetings of clubs or organizations: Not on file    Relationship status: Not on file  . Intimate partner violence    Fear of current or ex partner: Not on file    Emotionally abused: Not on file    Physically abused: Not on file    Forced sexual activity: Not on file  Other Topics Concern  . Not on file  Social History Narrative   Endo-- Dr Dwyane Dee   ENT--Dr shoemaker   GI--Dr Buccini   Pulm--Dr Clance   Rheum--Dr Charlestine Night          Outpatient Medications Prior to Visit  Medication Sig Dispense Refill  . acetaminophen (TYLENOL) 325 MG tablet Take 650 mg by mouth every 6 (six) hours as needed.    Marland Kitchen albuterol (VENTOLIN HFA) 108 (90 Base) MCG/ACT inhaler INHALE 2 PUFFS INTO THE LUNGS EVERY 4 HOURS AS NEEDED FOR WHEEZING OR SHORTNESS OF BREATH. 18 g 6  . Cod Liver Oil 1000 MG CAPS Take 1 capsule by mouth daily.    . diphenhydrAMINE (BENADRYL) 25 MG tablet Take 25 mg by mouth every 6 (six) hours as needed.    . fenofibrate micronized (LOFIBRA) 134 MG capsule TAKE 1 CAPSULE BY MOUTH DAILY BEFORE BREAKFAST 90 capsule 2  . furosemide (LASIX) 20 MG tablet Take 1-2 tablets (20-40 mg total) by mouth daily as needed for fluid or edema. 180 tablet 0  . hyoscyamine (LEVSIN SL) 0.125 MG SL tablet Place 1 tablet (0.125 mg total) under the tongue every 4 (four) hours as needed. 40 tablet 1  . linaclotide (LINZESS) 145 MCG CAPS capsule Take 1 capsule (145 mcg total) by mouth daily before breakfast. 30 capsule 5  . loratadine (CLARITIN) 10 MG tablet Take 10 mg by mouth daily.    .  metFORMIN (GLUCOPHAGE) 1000 MG tablet Take 1 tablet (1,000 mg total) by mouth daily with breakfast. 90 tablet 1  . spironolactone (ALDACTONE) 25 MG tablet Take 2  tablets (50 mg total) by mouth daily. 180 tablet 2  . SYNTHROID 100 MCG tablet Take 1 tablet (100 mcg total) by mouth daily before breakfast. 90 tablet 1  . benzonatate (TESSALON PERLES) 100 MG capsule Take 1 capsule (100 mg total) by mouth 3 (three) times daily as needed for cough. 20 capsule 0  . BYSTOLIC 20 MG TABS TAKE 1 TABLET BY MOUTH ONCE DAILY 90 tablet 2  . modafinil (PROVIGIL) 200 MG tablet Take 2 tablets (400 mg total) by mouth daily. 90 tablet 1  . Vitamin D, Ergocalciferol, (DRISDOL) 1.25 MG (50000 UT) CAPS capsule TAKE 1 CAPSULE BY MOUTH EVERY 7 DAYS 12 capsule 1   No facility-administered medications prior to visit.     Allergies  Allergen Reactions  . Losartan Cough  . Wellbutrin [Bupropion] Other (See Comments)    Dizziness and mental status changes  . Aspirin Other (See Comments)    GI upset REACTION: Upset stomach  . Atorvastatin     myalgias  . Codeine   . Amoxicillin Rash  . Erythromycin Rash  . Penicillins Rash    Review of Systems  Constitutional: Positive for malaise/fatigue. Negative for chills and fever.  HENT: Negative for congestion and hearing loss.   Eyes: Negative for discharge.  Respiratory: Negative for cough, sputum production and shortness of breath.   Cardiovascular: Negative for chest pain, palpitations and leg swelling.  Gastrointestinal: Positive for abdominal pain. Negative for blood in stool, constipation, diarrhea, heartburn, nausea and vomiting.  Genitourinary: Negative for dysuria, frequency, hematuria and urgency.  Musculoskeletal: Positive for joint pain and neck pain. Negative for back pain, falls and myalgias.  Skin: Negative for rash.  Neurological: Negative for dizziness, sensory change, loss of consciousness, weakness and headaches.  Endo/Heme/Allergies: Negative for  environmental allergies. Does not bruise/bleed easily.  Psychiatric/Behavioral: Negative for depression and suicidal ideas. The patient is not nervous/anxious and does not have insomnia.        Objective:    Physical Exam Constitutional:      General: She is not in acute distress.    Appearance: She is well-developed.  HENT:     Head: Normocephalic and atraumatic.  Eyes:     Conjunctiva/sclera: Conjunctivae normal.  Neck:     Musculoskeletal: Neck supple.     Thyroid: No thyromegaly.  Cardiovascular:     Rate and Rhythm: Normal rate and regular rhythm.     Heart sounds: Normal heart sounds. No murmur.  Pulmonary:     Effort: Pulmonary effort is normal. No respiratory distress.     Breath sounds: Normal breath sounds.  Abdominal:     General: Bowel sounds are normal. There is no distension.     Palpations: Abdomen is soft. There is no mass.     Tenderness: There is no abdominal tenderness.     Comments: Umbilical hernia, tender to palpation, mild  Lymphadenopathy:     Cervical: No cervical adenopathy.  Skin:    General: Skin is warm and dry.     Comments: Diffuse benign occurring lesions including skin tags and more.   Neurological:     Mental Status: She is alert and oriented to person, place, and time.  Psychiatric:        Behavior: Behavior normal.     BP 132/74 (BP Location: Left Arm, Patient Position: Sitting, Cuff Size: Normal)   Pulse (!) 107   Temp (!) 96.4 F (35.8 C) (Temporal)   Resp 18   Wt 244 lb 12.8 oz (  111 kg)   SpO2 98%   BMI 42.02 kg/m  Wt Readings from Last 3 Encounters:  01/16/19 244 lb 12.8 oz (111 kg)  05/21/18 238 lb 12.8 oz (108.3 kg)  04/11/18 237 lb 14 oz (107.9 kg)    Diabetic Foot Exam - Simple   No data filed     Lab Results  Component Value Date   WBC 3.9 (L) 12/12/2018   HGB 14.7 12/12/2018   HCT 44.3 12/12/2018   PLT 352.0 12/12/2018   GLUCOSE 97 12/12/2018   CHOL 203 (H) 12/12/2018   TRIG 99.0 12/12/2018   HDL 46.50  12/12/2018   LDLDIRECT 103.0 07/11/2016   LDLCALC 136 (H) 12/12/2018   ALT 17 12/12/2018   AST 19 12/12/2018   NA 140 12/12/2018   K 3.9 12/12/2018   CL 103 12/12/2018   CREATININE 0.95 12/12/2018   BUN 12 12/12/2018   CO2 24 12/12/2018   TSH 0.68 12/12/2018   HGBA1C 6.0 12/12/2018   MICROALBUR 0.7 10/11/2016    Lab Results  Component Value Date   TSH 0.68 12/12/2018   Lab Results  Component Value Date   WBC 3.9 (L) 12/12/2018   HGB 14.7 12/12/2018   HCT 44.3 12/12/2018   MCV 89.2 12/12/2018   PLT 352.0 12/12/2018   Lab Results  Component Value Date   NA 140 12/12/2018   K 3.9 12/12/2018   CO2 24 12/12/2018   GLUCOSE 97 12/12/2018   BUN 12 12/12/2018   CREATININE 0.95 12/12/2018   BILITOT 0.4 12/12/2018   ALKPHOS 59 12/12/2018   AST 19 12/12/2018   ALT 17 12/12/2018   PROT 7.2 12/12/2018   ALBUMIN 4.8 12/12/2018   CALCIUM 9.8 12/12/2018   ANIONGAP 10 04/04/2018   GFR 73.10 12/12/2018   Lab Results  Component Value Date   CHOL 203 (H) 12/12/2018   Lab Results  Component Value Date   HDL 46.50 12/12/2018   Lab Results  Component Value Date   LDLCALC 136 (H) 12/12/2018   Lab Results  Component Value Date   TRIG 99.0 12/12/2018   Lab Results  Component Value Date   CHOLHDL 4 12/12/2018   Lab Results  Component Value Date   HGBA1C 6.0 12/12/2018       Assessment & Plan:   Problem List Items Addressed This Visit    Hypothyroidism    On Levothyroxine, continue to monitor      Relevant Medications   metoprolol succinate (TOPROL-XL) 50 MG 24 hr tablet   Hyperlipemia, mixed    Encouraged heart healthy diet, increase exercise, avoid trans fats, consider a krill oil cap daily      Relevant Medications   metoprolol succinate (TOPROL-XL) 50 MG 24 hr tablet   OSA (obstructive sleep apnea)    Using CPAP      Essential hypertension    Well controlled, no changes to meds. Encouraged heart healthy diet such as the DASH diet and exercise as  tolerated.       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 Increase exercise as tolerated. Continue current meds  Myalgia    Hydrate and monitor      Morbid obesity ([place])    Encouraged DASH diet, decrease po intake and increase exercise as tolerated. Needs 7-8 hours of sleep nightly. Avoid trans fats, eat small, frequent meals every 4-5 hours with lean proteins, complex carbs and healthy fats. Minimize simple carbs, consider weight watchers APP      Right arm pain    Check xray shoulder and elbow. Has a chiropractor who is working with her along with orthopaedics, xrays confirm arthritic changes but no acute concerns. She will notify us if she is ready for further referral      Relevant Orders   DG Elbow 2 Views Right (Completed)   DG Shoulder Right (Completed)   Umbilical hernia    Has worsened recently and becomes tender at times. She is advised against heavy lifting and referred to general surgery for further consideration      Relevant Orders   Ambulatory referral to General Surgery   Sun-damaged skin    Referred to dermatology for further consideration      Relevant Orders   Ambulatory referral to Dermatology   Preventative health care    Patient encouraged to maintain heart healthy diet, regular exercise, adequate sleep. Consider daily probiotics. Take medications as prescribed. Labs ordered and reveiwed.        Other Visit Diagnoses    Needs flu shot    -  Primary   Relevant Orders   Flu Vaccine QUAD 6+ mos PF IM (Fluarix Quad PF) (Completed)      I have discontinued Shamyah S. Meroney's Bystolic, benzonatate, modafinil, Vitamin D  (Ergocalciferol), and Vitamin D (Ergocalciferol). I am also having her start on venlafaxine XR, venlafaxine XR, gabapentin, and metoprolol succinate. Additionally, I am having her maintain her albuterol, Cod Liver Oil, loratadine, acetaminophen, diphenhydrAMINE, furosemide, fenofibrate micronized, spironolactone, metFORMIN, Synthroid, linaclotide, and hyoscyamine.  Meds ordered this encounter  Medications  . DISCONTD: Vitamin D, Ergocalciferol, (DRISDOL) 1.25 MG (50000 UT) CAPS capsule    Sig: TAKE 1 CAPSULE BY MOUTH EVERY 7 DAYS    Dispense:  12 capsule    Refill:  1  . venlafaxine XR (EFFEXOR XR) 37.5 MG 24 hr capsule    Sig: Take 1 capsule (37.5 mg total) by mouth daily with breakfast.    Dispense:  7 capsule    Refill:  0  . venlafaxine XR (EFFEXOR XR) 75 MG 24 hr capsule    Sig: Take 1 capsule (75 mg total) by mouth daily with breakfast.    Dispense:  30 capsule    Refill:  5  . gabapentin (NEURONTIN) 300 MG capsule    Sig: Take 1 capsule (300 mg total) by mouth 3 (three) times daily.    Dispense:  270 capsule    Refill:  1  . metoprolol succinate (TOPROL-XL) 50 MG 24 hr tablet    Sig: Take 1 tablet (50 mg total) by mouth daily. Take with or immediately following a meal.    Dispense:  90 tablet    Refill:  3     Penni Homans, MD

## 2019-01-19 NOTE — Assessment & Plan Note (Signed)
Patient encouraged to maintain heart healthy diet, regular exercise, adequate sleep. Consider daily probiotics. Take medications as prescribed. Labs ordered and reveiwed.

## 2019-01-27 ENCOUNTER — Telehealth: Payer: Self-pay | Admitting: *Deleted

## 2019-01-27 NOTE — Telephone Encounter (Signed)
Requested documentation was faxed at 9:30. Pt states she still has not received it. Advised pt I will refax to below # and she voices understanding.

## 2019-01-27 NOTE — Telephone Encounter (Signed)
Copied from [place] [id number]. Topic: General - Other >> Jan 27, 2019  9:21 AM Mathis Bud wrote: Reason for CRM: Patient is requesting a copy of her flu shot to give to her employer, she works for [employer] and needs a hard copy. Please fax to FAX # [phone] Attention: Richard    Patient Call back [phone]

## 2019-01-28 ENCOUNTER — Telehealth: Payer: Self-pay

## 2019-01-28 NOTE — Telephone Encounter (Signed)
Copied from [place] [phone]. Topic: General - Other >> Jan 27, 2019  9:21 AM Mathis Bud wrote: Reason for CRM: Patient is requesting a copy of her flu shot to give to her employer, she works for [employer] and needs a hard copy. Please fax to FAX # [phone] Attention: Shey    Patient Call back [phone] >> Jan 27, 2019  9:43 AM Leward Quan A wrote: Patient called to say that the fax being sent is coming to her cell phone instead of fax # provided.     Mailed letter to patient for proof of flu shot

## 2019-01-28 NOTE — Telephone Encounter (Signed)
Copied from [place] [id number]. Topic: General - Other >> Jan 27, 2019  9:21 AM Mathis Bud wrote: Reason for CRM: Patient is requesting a copy of her flu shot to give to her employer, she works for [employer] and needs a hard copy. Please fax to FAX # [phone] Attention: Beth[place]    Patient Call back [phone] >> Jan 27, 2019  9:43 AM Leward Quan A wrote: Patient called to say that the fax being sent is coming to her cell phone instead of fax # provided.

## 2019-01-30 DIAGNOSIS — M62830 Muscle spasm of back: Secondary | ICD-10-CM | POA: Diagnosis not present

## 2019-01-30 DIAGNOSIS — M9905 Segmental and somatic dysfunction of pelvic region: Secondary | ICD-10-CM | POA: Diagnosis not present

## 2019-01-30 DIAGNOSIS — M5136 Other intervertebral disc degeneration, lumbar region: Secondary | ICD-10-CM | POA: Diagnosis not present

## 2019-01-30 DIAGNOSIS — M9903 Segmental and somatic dysfunction of lumbar region: Secondary | ICD-10-CM | POA: Diagnosis not present

## 2019-01-30 DIAGNOSIS — M9902 Segmental and somatic dysfunction of thoracic region: Secondary | ICD-10-CM | POA: Diagnosis not present

## 2019-01-30 DIAGNOSIS — M9901 Segmental and somatic dysfunction of cervical region: Secondary | ICD-10-CM | POA: Diagnosis not present

## 2019-02-06 MED FILL — VIT D2 1.25 MG (50,000 UNIT: 1.25 MG | 84 days supply | Qty: 12 | Fill #0

## 2019-02-06 MED FILL — SYNTHROID 100 MCG TABLET: 100 | 90 days supply | Qty: 90 | Fill #1

## 2019-02-06 MED FILL — FUROSEMIDE 20 MG TABS: 20 | 20 days supply | Qty: 40 | Fill #1

## 2019-02-18 MED FILL — LINZESS 145 MCG CAPSULE: 145 | 30 days supply | Qty: 30 | Fill #3

## 2019-02-18 MED FILL — VENLAFAXINE HCL ER 75 MG CA: 75 | 30 days supply | Qty: 30 | Fill #1

## 2019-02-19 ENCOUNTER — Other Ambulatory Visit: Payer: Self-pay | Admitting: Pulmonary Disease

## 2019-02-19 NOTE — Telephone Encounter (Signed)
Per the pt's history medication list. This medication was deleted off of her list by Dr. Charlett Blake on 01/16/2019, a reason was not given as to why it was deleted. Modafinil was last refilled by Aaron Edelman on 07/23/2018 #90 with 1 additional refill.

## 2019-02-19 NOTE — Telephone Encounter (Signed)
[name] - please advise on refill, you are the only provider she has seen recently. She used to see [facility] but has not been set up with another MD.

## 2019-02-20 DIAGNOSIS — M62830 Muscle spasm of back: Secondary | ICD-10-CM | POA: Diagnosis not present

## 2019-02-20 DIAGNOSIS — M5136 Other intervertebral disc degeneration, lumbar region: Secondary | ICD-10-CM | POA: Diagnosis not present

## 2019-02-20 DIAGNOSIS — M9901 Segmental and somatic dysfunction of cervical region: Secondary | ICD-10-CM | POA: Diagnosis not present

## 2019-02-20 DIAGNOSIS — M9905 Segmental and somatic dysfunction of pelvic region: Secondary | ICD-10-CM | POA: Diagnosis not present

## 2019-02-20 DIAGNOSIS — M9903 Segmental and somatic dysfunction of lumbar region: Secondary | ICD-10-CM | POA: Diagnosis not present

## 2019-02-20 DIAGNOSIS — M9902 Segmental and somatic dysfunction of thoracic region: Secondary | ICD-10-CM | POA: Diagnosis not present

## 2019-02-21 ENCOUNTER — Other Ambulatory Visit: Payer: Self-pay | Admitting: Family Medicine

## 2019-02-21 ENCOUNTER — Encounter: Payer: Self-pay | Admitting: Family Medicine

## 2019-02-21 MED ORDER — MODAFINIL 200 MG PO TABS: 400.0000 mg | ORAL_TABLET | Freq: Every day | ORAL | 1 refills | Status: DC

## 2019-02-21 MED FILL — MODAFINIL 200 MG TABLET: 200 | 45 days supply | Qty: 90 | Fill #0

## 2019-02-27 ENCOUNTER — Ambulatory Visit (INDEPENDENT_AMBULATORY_CARE_PROVIDER_SITE_OTHER): Payer: 59 | Admitting: Family Medicine

## 2019-02-27 ENCOUNTER — Encounter: Payer: Self-pay | Admitting: Family Medicine

## 2019-02-27 ENCOUNTER — Other Ambulatory Visit: Payer: Self-pay

## 2019-02-27 DIAGNOSIS — M79601 Pain in right arm: Secondary | ICD-10-CM | POA: Diagnosis not present

## 2019-02-27 DIAGNOSIS — K429 Umbilical hernia without obstruction or gangrene: Secondary | ICD-10-CM

## 2019-02-27 DIAGNOSIS — I10 Essential (primary) hypertension: Secondary | ICD-10-CM

## 2019-02-27 DIAGNOSIS — E559 Vitamin D deficiency, unspecified: Secondary | ICD-10-CM

## 2019-02-27 DIAGNOSIS — E669 Obesity, unspecified: Secondary | ICD-10-CM | POA: Diagnosis not present

## 2019-02-27 DIAGNOSIS — R000 Tachycardia, unspecified: Secondary | ICD-10-CM | POA: Diagnosis not present

## 2019-02-27 DIAGNOSIS — E1169 Type 2 diabetes mellitus with other specified complication: Secondary | ICD-10-CM | POA: Diagnosis not present

## 2019-02-27 DIAGNOSIS — E039 Hypothyroidism, unspecified: Secondary | ICD-10-CM | POA: Diagnosis not present

## 2019-02-27 MED ORDER — VITAMIN D (ERGOCALCIFEROL) 1.25 MG (50000 UNIT) PO CAPS: 50000.0000 [IU] | ORAL_CAPSULE | ORAL | 4 refills | Status: DC

## 2019-02-28 NOTE — Telephone Encounter (Signed)
Noted  

## 2019-03-03 ENCOUNTER — Encounter: Payer: Self-pay | Admitting: Family Medicine

## 2019-03-03 DIAGNOSIS — R000 Tachycardia, unspecified: Secondary | ICD-10-CM | POA: Insufficient documentation

## 2019-03-03 NOTE — Telephone Encounter (Signed)
Can you get her scheduled?

## 2019-03-03 NOTE — Assessment & Plan Note (Signed)
Supplement and monitor 

## 2019-03-03 NOTE — Assessment & Plan Note (Signed)
minimize simple carbs. Increase exercise as tolerated. Continue current meds  

## 2019-03-03 NOTE — Progress Notes (Signed)
Virtual Visit via Video Note  I connected with [name] on 02/27/19 at  2:40 PM EST by a video enabled telemedicine application and verified that I am speaking with the correct person using two identifiers.  Location: Patient: home Provider: office   I discussed the limitations of evaluation and management by telemedicine and the availability of in person appointments. The patient expressed understanding and agreed to proceed. Magdalene Molly, CMA was able to get the patient set up on a video visit.     Subjective:    Patient ID: [name], female    DOB: [birth year], 58 y.o.   MRN: [id number]  No chief complaint on file.   HPI Patient is in today for follow up on chronic medical concerns including tachycardia, diabetes, hypothyroidism and more. No recent febrile illness or hospitalizations. Notes the tahycardia is better with the Metoprolol. Her Venlafaxine is helpful. Denies CP/palp/SOB/HA/congestion/fevers/GI or GU c/o. Taking meds as prescribed. Does note intermittent peri umbilical pain from her hernia but the pain never persists and is tolerable so far. No change in bowel habits, nausea, vomiting or bloody stool.  Past Medical History:  Diagnosis Date  . Acute bronchitis 05/25/2016  . Anxiety   . Chronic pain syndrome 01/06/2013  . Chronic rhinosinusitis   . Colon polyps   . Cough 01/06/2013  . Depression   . Diabetes ([place]) 01/06/2013  . Diabetes mellitus type 2 in obese [facility]) 01/06/2013   Sees Dr Calvert Cantor for eye exam Does not see podiatry, foot exam today unremarkable except for thick cracking skin on heals   . Dysuria 05/25/2016  . Edema 01/06/2013  . Epistaxis 05/25/2016  . Fibroid, uterine   . GERD (gastroesophageal reflux disease)   . Glaucoma 12-13  . Hoarseness   . Hoarseness of voice 05/11/2013  . Hyperglycemia 04/13/2013  . Hyperlipemia, mixed 06/06/2007   Qualifier: Diagnosis of  By: Wynona Luna She feels Lipitor caused increased low back pain and  weakness    . Hyperlipidemia   . Hypertension   . Hypokalemia 02/05/2013  . IBS (irritable bowel syndrome) 02/13/2011  . Low back pain 03/03/2013  . Muscle cramp 05/22/2014  . Obesity   . Obesity, unspecified 05/11/2013  . OSA (obstructive sleep apnea)   . Pain in joint, shoulder region 06/27/2015  . Perimenopause 10/05/2012  . Raynaud disease 06/16/2013  . Thyroid disease    hypothyroidism  . Vocal fold nodules     Past Surgical History:  Procedure Laterality Date  . ABDOMINAL HYSTERECTOMY    . BREAST EXCISIONAL BIOPSY Right    at age 89  . CHOLECYSTECTOMY    . CYSTECTOMY    . DILATION AND CURETTAGE OF UTERUS     x2  . I&D EXTREMITY Left 04/11/2018   Procedure: DEBIDMENT DISTAL INTERPHALANGEAL LEFT MIDDLE;  Surgeon: Daryll Brod, MD;  Location: [place];  Service: Orthopedics;  Laterality: Left;  Marland Kitchen MASS EXCISION Left 04/11/2018   Procedure: EXCISION MASS;  Surgeon: Daryll Brod, MD;  Location: [place];  Service: Orthopedics;  Laterality: Left;  . OOPHORECTOMY    . POLYPECTOMY    . ROTATOR CUFF REPAIR      Family History  Problem Relation Age of Onset  . Alcohol abuse Father   . Cancer Father        renal and colon  . Hyperlipidemia Father   . Hypertension Father   . Stroke Father   . Heart disease Father   .  Cancer Paternal Grandfather        colon  . Diabetes Other   . High blood pressure Mother   . High Cholesterol Mother   . Kidney disease Mother   . Depression Mother   . Anxiety disorder Mother   . Obesity Mother   . Breast cancer Other 45  . Diabetes Sister   . Diabetes Brother     Social History   Socioeconomic History  . Marital status: Significant Other    Spouse name: Not on file  . Number of children: 0  . Years of education: Not on file  . Highest education level: Not on file  Occupational History  . Occupation: Technical sales engineer  Social Needs  . Financial resource strain: Not on file  . Food  insecurity    Worry: Not on file    Inability: Not on file  . Transportation needs    Medical: Not on file    Non-medical: Not on file  Tobacco Use  . Smoking status: Former Smoker    Packs/day: 0.50    Years: 30.00    Pack years: 15.00    Types: Cigarettes    Quit date: 04/25/2003    Years since quitting: 15.8  . Smokeless tobacco: Never Used  . Tobacco comment: smoked since age 2  Substance and Sexual Activity  . Alcohol use: No  . Drug use: Never  . Sexual activity: Yes    Partners: Male  Lifestyle  . Physical activity    Days per week: Not on file    Minutes per session: Not on file  . Stress: Not on file  Relationships  . Social Herbalist on phone: Not on file    Gets together: Not on file    Attends religious service: Not on file    Active member of club or organization: Not on file    Attends meetings of clubs or organizations: Not on file    Relationship status: Not on file  . Intimate partner violence    Fear of current or ex partner: Not on file    Emotionally abused: Not on file    Physically abused: Not on file    Forced sexual activity: Not on file  Other Topics Concern  . Not on file  Social History Narrative   Endo-- Dr Dwyane Dee   ENT--Dr shoemaker   GI--Dr Buccini   Pulm--Dr Clance   Rheum--Dr Charlestine Night          Outpatient Medications Prior to Visit  Medication Sig Dispense Refill  . acetaminophen (TYLENOL) 325 MG tablet Take 650 mg by mouth every 6 (six) hours as needed.    Marland Kitchen albuterol (VENTOLIN HFA) 108 (90 Base) MCG/ACT inhaler INHALE 2 PUFFS INTO THE LUNGS EVERY 4 HOURS AS NEEDED FOR WHEEZING OR SHORTNESS OF BREATH. 18 g 6  . Cod Liver Oil 1000 MG CAPS Take 1 capsule by mouth daily.    . diphenhydrAMINE (BENADRYL) 25 MG tablet Take 25 mg by mouth every 6 (six) hours as needed.    . fenofibrate micronized (LOFIBRA) 134 MG capsule TAKE 1 CAPSULE BY MOUTH DAILY BEFORE BREAKFAST 90 capsule 2  . furosemide (LASIX) 20 MG tablet Take 1-2  tablets (20-40 mg total) by mouth daily as needed for fluid or edema. 180 tablet 0  . gabapentin (NEURONTIN) 300 MG capsule Take 1 capsule (300 mg total) by mouth 3 (three) times daily. 270 capsule 1  . hyoscyamine (LEVSIN SL) 0.125 MG  SL tablet Place 1 tablet (0.125 mg total) under the tongue every 4 (four) hours as needed. 40 tablet 1  . linaclotide (LINZESS) 145 MCG CAPS capsule Take 1 capsule (145 mcg total) by mouth daily before breakfast. 30 capsule 5  . loratadine (CLARITIN) 10 MG tablet Take 10 mg by mouth daily.    . metFORMIN (GLUCOPHAGE) 1000 MG tablet Take 1 tablet (1,000 mg total) by mouth daily with breakfast. 90 tablet 1  . metoprolol succinate (TOPROL-XL) 50 MG 24 hr tablet Take 1 tablet (50 mg total) by mouth daily. Take with or immediately following a meal. 90 tablet 3  . modafinil (PROVIGIL) 200 MG tablet Take 2 tablets (400 mg total) by mouth daily. 90 tablet 1  . spironolactone (ALDACTONE) 25 MG tablet Take 2 tablets (50 mg total) by mouth daily. 180 tablet 2  . SYNTHROID 100 MCG tablet Take 1 tablet (100 mcg total) by mouth daily before breakfast. 90 tablet 1  . venlafaxine XR (EFFEXOR XR) 37.5 MG 24 hr capsule Take 1 capsule (37.5 mg total) by mouth daily with breakfast. 7 capsule 0  . venlafaxine XR (EFFEXOR XR) 75 MG 24 hr capsule Take 1 capsule (75 mg total) by mouth daily with breakfast. 30 capsule 5   No facility-administered medications prior to visit.     Allergies  Allergen Reactions  . Losartan Cough  . Wellbutrin [Bupropion] Other (See Comments)    Dizziness and mental status changes  . Aspirin Other (See Comments)    GI upset REACTION: Upset stomach  . Atorvastatin     myalgias  . Codeine   . Amoxicillin Rash  . Erythromycin Rash  . Penicillins Rash    Review of Systems  Constitutional: Negative for fever and malaise/fatigue.  HENT: Negative for congestion.   Eyes: Negative for blurred vision.  Respiratory: Negative for shortness of breath.    Cardiovascular: Negative for chest pain, palpitations and leg swelling.  Gastrointestinal: Positive for abdominal pain. Negative for blood in stool and nausea.  Genitourinary: Negative for dysuria and frequency.  Musculoskeletal: Positive for joint pain, myalgias and neck pain. Negative for falls.  Skin: Negative for rash.  Neurological: Negative for dizziness, loss of consciousness and headaches.  Endo/Heme/Allergies: Negative for environmental allergies.  Psychiatric/Behavioral: Negative for depression. The patient is not nervous/anxious.        Objective:    Physical Exam Constitutional:      Appearance: Normal appearance. She is not ill-appearing.  HENT:     Head: Normocephalic and atraumatic.  Eyes:     General:        Right eye: No discharge.        Left eye: No discharge.  Pulmonary:     Effort: Pulmonary effort is normal.  Neurological:     Mental Status: She is alert and oriented to person, place, and time.  Psychiatric:        Mood and Affect: Mood normal.        Behavior: Behavior normal.     There were no vitals taken for this visit. Wt Readings from Last 3 Encounters:  01/16/19 244 lb 12.8 oz (111 kg)  05/21/18 238 lb 12.8 oz (108.3 kg)  04/11/18 237 lb 14 oz (107.9 kg)    Diabetic Foot Exam - Simple   No data filed     Lab Results  Component Value Date   WBC 3.9 (L) 12/12/2018   HGB 14.7 12/12/2018   HCT 44.3 12/12/2018   PLT 352.0 12/12/2018  GLUCOSE 97 12/12/2018   CHOL 203 (H) 12/12/2018   TRIG 99.0 12/12/2018   HDL 46.50 12/12/2018   LDLDIRECT 103.0 07/11/2016   LDLCALC 136 (H) 12/12/2018   ALT 17 12/12/2018   AST 19 12/12/2018   NA 140 12/12/2018   K 3.9 12/12/2018   CL 103 12/12/2018   CREATININE 0.95 12/12/2018   BUN 12 12/12/2018   CO2 24 12/12/2018   TSH 0.68 12/12/2018   HGBA1C 6.0 12/12/2018   MICROALBUR 0.7 10/11/2016    Lab Results  Component Value Date   TSH 0.68 12/12/2018   Lab Results  Component Value Date    WBC 3.9 (L) 12/12/2018   HGB 14.7 12/12/2018   HCT 44.3 12/12/2018   MCV 89.2 12/12/2018   PLT 352.0 12/12/2018   Lab Results  Component Value Date   NA 140 12/12/2018   K 3.9 12/12/2018   CO2 24 12/12/2018   GLUCOSE 97 12/12/2018   BUN 12 12/12/2018   CREATININE 0.95 12/12/2018   BILITOT 0.4 12/12/2018   ALKPHOS 59 12/12/2018   AST 19 12/12/2018   ALT 17 12/12/2018   PROT 7.2 12/12/2018   ALBUMIN 4.8 12/12/2018   CALCIUM 9.8 12/12/2018   ANIONGAP 10 04/04/2018   GFR 73.10 12/12/2018   Lab Results  Component Value Date   CHOL 203 (H) 12/12/2018   Lab Results  Component Value Date   HDL 46.50 12/12/2018   Lab Results  Component Value Date   LDLCALC 136 (H) 12/12/2018   Lab Results  Component Value Date   TRIG 99.0 12/12/2018   Lab Results  Component Value Date   CHOLHDL 4 12/12/2018   Lab Results  Component Value Date   HGBA1C 6.0 12/12/2018       Assessment & Plan:   Problem List Items Addressed This Visit    Hypothyroidism    On Levothyroxine, continue to monitor      Essential hypertension    Monitor weekly and report any concerns.  no changes to meds. Encouraged heart healthy diet such as the DASH diet and exercise as tolerated.       Vitamin D deficiency    Supplement and monitor      Diabetes mellitus type 2 in obese ([facility])     minimize simple carbs. Increase exercise as tolerated. Continue current meds      Right arm pain    From neck to shoulder to arm. May continue chiropractic. Encouraged moist heat and gentle stretching as tolerated. May try NSAIDs and prescription meds as directed and report if symptoms worsen or seek immediate care.       Umbilical hernia    Has intermittent pain but never persistent. If pain worsens then will notify us for referral      Tachycardia    Improved with Metoprolol heart rate largely in the 80s         I am having Seriyah S. Banko start on Vitamin D (Ergocalciferol). I am also having her  maintain her albuterol, Cod Liver Oil, loratadine, acetaminophen, diphenhydrAMINE, furosemide, fenofibrate micronized, spironolactone, metFORMIN, Synthroid, linaclotide, hyoscyamine, venlafaxine XR, venlafaxine XR, gabapentin, metoprolol succinate, and modafinil.  Meds ordered this encounter  Medications  . Vitamin D, Ergocalciferol, (DRISDOL) 1.25 MG (50000 UT) CAPS capsule    Sig: Take 1 capsule (50,000 Units total) by mouth every 7 (seven) days.    Dispense:  4 capsule    Refill:  4    I discussed the assessment and treatment plan with  the patient. The patient was provided an opportunity to ask questions and all were answered. The patient agreed with the plan and demonstrated an understanding of the instructions.   The patient was advised to call back or seek an in-person evaluation if the symptoms worsen or if the condition fails to improve as anticipated.  I provided 25 minutes of non-face-to-face time during this encounter.   Penni Homans, MD

## 2019-03-03 NOTE — Assessment & Plan Note (Signed)
Monitor weekly and report any concerns.no changes to meds. Encouraged heart healthy diet such as the DASH diet and exercise as tolerated.  

## 2019-03-03 NOTE — Assessment & Plan Note (Signed)
Improved with Metoprolol heart rate largely in the 80s

## 2019-03-03 NOTE — Assessment & Plan Note (Signed)
From neck to shoulder to arm. May continue chiropractic. Encouraged moist heat and gentle stretching as tolerated. May try NSAIDs and prescription meds as directed and report if symptoms worsen or seek immediate care.

## 2019-03-03 NOTE — Assessment & Plan Note (Signed)
Has intermittent pain but never persistent. If pain worsens then will notify us for referral

## 2019-03-03 NOTE — Assessment & Plan Note (Signed)
On Levothyroxine, continue to monitor 

## 2019-03-06 DIAGNOSIS — E669 Obesity, unspecified: Secondary | ICD-10-CM | POA: Diagnosis not present

## 2019-03-06 DIAGNOSIS — K432 Incisional hernia without obstruction or gangrene: Secondary | ICD-10-CM | POA: Diagnosis not present

## 2019-03-06 DIAGNOSIS — E1169 Type 2 diabetes mellitus with other specified complication: Secondary | ICD-10-CM | POA: Diagnosis not present

## 2019-03-06 DIAGNOSIS — I10 Essential (primary) hypertension: Secondary | ICD-10-CM | POA: Diagnosis not present

## 2019-03-06 DIAGNOSIS — Z9889 Other specified postprocedural states: Secondary | ICD-10-CM | POA: Diagnosis not present

## 2019-03-06 DIAGNOSIS — G4733 Obstructive sleep apnea (adult) (pediatric): Secondary | ICD-10-CM | POA: Diagnosis not present

## 2019-03-10 ENCOUNTER — Encounter: Payer: Self-pay | Admitting: Family Medicine

## 2019-03-11 ENCOUNTER — Other Ambulatory Visit: Payer: Self-pay | Admitting: Family Medicine

## 2019-03-11 MED ORDER — METOPROLOL SUCCINATE ER 100 MG PO TB24: 100.0000 mg | ORAL_TABLET | Freq: Every day | ORAL | 1 refills | Status: DC

## 2019-03-11 MED ORDER — VENLAFAXINE HCL ER 150 MG PO TB24: 150.0000 mg | ORAL_TABLET | Freq: Every day | ORAL | 1 refills | Status: DC

## 2019-03-12 DIAGNOSIS — M5136 Other intervertebral disc degeneration, lumbar region: Secondary | ICD-10-CM | POA: Diagnosis not present

## 2019-03-12 DIAGNOSIS — M9903 Segmental and somatic dysfunction of lumbar region: Secondary | ICD-10-CM | POA: Diagnosis not present

## 2019-03-12 DIAGNOSIS — M9902 Segmental and somatic dysfunction of thoracic region: Secondary | ICD-10-CM | POA: Diagnosis not present

## 2019-03-12 DIAGNOSIS — M5031 Other cervical disc degeneration,  high cervical region: Secondary | ICD-10-CM | POA: Diagnosis not present

## 2019-03-12 DIAGNOSIS — M62830 Muscle spasm of back: Secondary | ICD-10-CM | POA: Diagnosis not present

## 2019-03-12 DIAGNOSIS — M9901 Segmental and somatic dysfunction of cervical region: Secondary | ICD-10-CM | POA: Diagnosis not present

## 2019-03-12 DIAGNOSIS — M9905 Segmental and somatic dysfunction of pelvic region: Secondary | ICD-10-CM | POA: Diagnosis not present

## 2019-03-12 DIAGNOSIS — M47812 Spondylosis without myelopathy or radiculopathy, cervical region: Secondary | ICD-10-CM | POA: Diagnosis not present

## 2019-03-12 DIAGNOSIS — M5032 Other cervical disc degeneration, mid-cervical region, unspecified level: Secondary | ICD-10-CM | POA: Diagnosis not present

## 2019-03-12 MED FILL — VENLAFAXINE HCL ER 150 MG T: 150 | 30 days supply | Qty: 30 | Fill #0

## 2019-03-12 MED FILL — METOPROLOL SUCCINATE ER 100: 100 | 30 days supply | Qty: 30 | Fill #0

## 2019-03-13 MED FILL — FUROSEMIDE 20 MG TABS: 20 | 20 days supply | Qty: 40 | Fill #0

## 2019-03-14 DIAGNOSIS — M5136 Other intervertebral disc degeneration, lumbar region: Secondary | ICD-10-CM | POA: Diagnosis not present

## 2019-03-14 DIAGNOSIS — M5032 Other cervical disc degeneration, mid-cervical region, unspecified level: Secondary | ICD-10-CM | POA: Diagnosis not present

## 2019-03-14 DIAGNOSIS — M9905 Segmental and somatic dysfunction of pelvic region: Secondary | ICD-10-CM | POA: Diagnosis not present

## 2019-03-14 DIAGNOSIS — M47812 Spondylosis without myelopathy or radiculopathy, cervical region: Secondary | ICD-10-CM | POA: Diagnosis not present

## 2019-03-14 DIAGNOSIS — M9902 Segmental and somatic dysfunction of thoracic region: Secondary | ICD-10-CM | POA: Diagnosis not present

## 2019-03-14 DIAGNOSIS — M5031 Other cervical disc degeneration,  high cervical region: Secondary | ICD-10-CM | POA: Diagnosis not present

## 2019-03-14 DIAGNOSIS — M9903 Segmental and somatic dysfunction of lumbar region: Secondary | ICD-10-CM | POA: Diagnosis not present

## 2019-03-14 DIAGNOSIS — M9901 Segmental and somatic dysfunction of cervical region: Secondary | ICD-10-CM | POA: Diagnosis not present

## 2019-03-14 DIAGNOSIS — M62830 Muscle spasm of back: Secondary | ICD-10-CM | POA: Diagnosis not present

## 2019-03-17 DIAGNOSIS — M9902 Segmental and somatic dysfunction of thoracic region: Secondary | ICD-10-CM | POA: Diagnosis not present

## 2019-03-17 DIAGNOSIS — M5032 Other cervical disc degeneration, mid-cervical region, unspecified level: Secondary | ICD-10-CM | POA: Diagnosis not present

## 2019-03-17 DIAGNOSIS — M9901 Segmental and somatic dysfunction of cervical region: Secondary | ICD-10-CM | POA: Diagnosis not present

## 2019-03-17 DIAGNOSIS — M5136 Other intervertebral disc degeneration, lumbar region: Secondary | ICD-10-CM | POA: Diagnosis not present

## 2019-03-17 DIAGNOSIS — M9903 Segmental and somatic dysfunction of lumbar region: Secondary | ICD-10-CM | POA: Diagnosis not present

## 2019-03-17 DIAGNOSIS — M5031 Other cervical disc degeneration,  high cervical region: Secondary | ICD-10-CM | POA: Diagnosis not present

## 2019-03-17 DIAGNOSIS — M47812 Spondylosis without myelopathy or radiculopathy, cervical region: Secondary | ICD-10-CM | POA: Diagnosis not present

## 2019-03-17 DIAGNOSIS — M62830 Muscle spasm of back: Secondary | ICD-10-CM | POA: Diagnosis not present

## 2019-03-17 DIAGNOSIS — M9905 Segmental and somatic dysfunction of pelvic region: Secondary | ICD-10-CM | POA: Diagnosis not present

## 2019-03-21 DIAGNOSIS — M9901 Segmental and somatic dysfunction of cervical region: Secondary | ICD-10-CM | POA: Diagnosis not present

## 2019-03-21 DIAGNOSIS — M9905 Segmental and somatic dysfunction of pelvic region: Secondary | ICD-10-CM | POA: Diagnosis not present

## 2019-03-21 DIAGNOSIS — M5031 Other cervical disc degeneration,  high cervical region: Secondary | ICD-10-CM | POA: Diagnosis not present

## 2019-03-21 DIAGNOSIS — M5032 Other cervical disc degeneration, mid-cervical region, unspecified level: Secondary | ICD-10-CM | POA: Diagnosis not present

## 2019-03-21 DIAGNOSIS — M62830 Muscle spasm of back: Secondary | ICD-10-CM | POA: Diagnosis not present

## 2019-03-21 DIAGNOSIS — M9903 Segmental and somatic dysfunction of lumbar region: Secondary | ICD-10-CM | POA: Diagnosis not present

## 2019-03-21 DIAGNOSIS — M47812 Spondylosis without myelopathy or radiculopathy, cervical region: Secondary | ICD-10-CM | POA: Diagnosis not present

## 2019-03-21 DIAGNOSIS — M9902 Segmental and somatic dysfunction of thoracic region: Secondary | ICD-10-CM | POA: Diagnosis not present

## 2019-03-21 DIAGNOSIS — M5136 Other intervertebral disc degeneration, lumbar region: Secondary | ICD-10-CM | POA: Diagnosis not present

## 2019-03-25 DIAGNOSIS — M5031 Other cervical disc degeneration,  high cervical region: Secondary | ICD-10-CM | POA: Diagnosis not present

## 2019-03-25 DIAGNOSIS — M9901 Segmental and somatic dysfunction of cervical region: Secondary | ICD-10-CM | POA: Diagnosis not present

## 2019-03-25 DIAGNOSIS — M9902 Segmental and somatic dysfunction of thoracic region: Secondary | ICD-10-CM | POA: Diagnosis not present

## 2019-03-25 DIAGNOSIS — M5032 Other cervical disc degeneration, mid-cervical region, unspecified level: Secondary | ICD-10-CM | POA: Diagnosis not present

## 2019-03-25 DIAGNOSIS — M9903 Segmental and somatic dysfunction of lumbar region: Secondary | ICD-10-CM | POA: Diagnosis not present

## 2019-03-25 DIAGNOSIS — M47812 Spondylosis without myelopathy or radiculopathy, cervical region: Secondary | ICD-10-CM | POA: Diagnosis not present

## 2019-03-25 DIAGNOSIS — M5136 Other intervertebral disc degeneration, lumbar region: Secondary | ICD-10-CM | POA: Diagnosis not present

## 2019-03-25 DIAGNOSIS — M62830 Muscle spasm of back: Secondary | ICD-10-CM | POA: Diagnosis not present

## 2019-03-25 DIAGNOSIS — M9905 Segmental and somatic dysfunction of pelvic region: Secondary | ICD-10-CM | POA: Diagnosis not present

## 2019-03-27 DIAGNOSIS — M47812 Spondylosis without myelopathy or radiculopathy, cervical region: Secondary | ICD-10-CM | POA: Diagnosis not present

## 2019-03-27 DIAGNOSIS — M5033 Other cervical disc degeneration, cervicothoracic region: Secondary | ICD-10-CM | POA: Diagnosis not present

## 2019-03-27 DIAGNOSIS — M9905 Segmental and somatic dysfunction of pelvic region: Secondary | ICD-10-CM | POA: Diagnosis not present

## 2019-03-27 DIAGNOSIS — M5032 Other cervical disc degeneration, mid-cervical region, unspecified level: Secondary | ICD-10-CM | POA: Diagnosis not present

## 2019-03-27 DIAGNOSIS — M9903 Segmental and somatic dysfunction of lumbar region: Secondary | ICD-10-CM | POA: Diagnosis not present

## 2019-03-27 DIAGNOSIS — M9902 Segmental and somatic dysfunction of thoracic region: Secondary | ICD-10-CM | POA: Diagnosis not present

## 2019-03-27 DIAGNOSIS — M5136 Other intervertebral disc degeneration, lumbar region: Secondary | ICD-10-CM | POA: Diagnosis not present

## 2019-03-27 DIAGNOSIS — M9901 Segmental and somatic dysfunction of cervical region: Secondary | ICD-10-CM | POA: Diagnosis not present

## 2019-03-27 DIAGNOSIS — M5031 Other cervical disc degeneration,  high cervical region: Secondary | ICD-10-CM | POA: Diagnosis not present

## 2019-04-02 MED FILL — SPIRONOLACTONE 25 MG TABS: 25 | 90 days supply | Qty: 180 | Fill #1

## 2019-04-02 MED FILL — FUROSEMIDE 20 MG TABS: 20 | 20 days supply | Qty: 40 | Fill #1

## 2019-04-10 DIAGNOSIS — M5136 Other intervertebral disc degeneration, lumbar region: Secondary | ICD-10-CM | POA: Diagnosis not present

## 2019-04-10 DIAGNOSIS — M5033 Other cervical disc degeneration, cervicothoracic region: Secondary | ICD-10-CM | POA: Diagnosis not present

## 2019-04-10 DIAGNOSIS — M5031 Other cervical disc degeneration,  high cervical region: Secondary | ICD-10-CM | POA: Diagnosis not present

## 2019-04-10 DIAGNOSIS — M9903 Segmental and somatic dysfunction of lumbar region: Secondary | ICD-10-CM | POA: Diagnosis not present

## 2019-04-10 DIAGNOSIS — M9905 Segmental and somatic dysfunction of pelvic region: Secondary | ICD-10-CM | POA: Diagnosis not present

## 2019-04-10 DIAGNOSIS — M47812 Spondylosis without myelopathy or radiculopathy, cervical region: Secondary | ICD-10-CM | POA: Diagnosis not present

## 2019-04-10 DIAGNOSIS — M5032 Other cervical disc degeneration, mid-cervical region, unspecified level: Secondary | ICD-10-CM | POA: Diagnosis not present

## 2019-04-10 DIAGNOSIS — M9902 Segmental and somatic dysfunction of thoracic region: Secondary | ICD-10-CM | POA: Diagnosis not present

## 2019-04-10 DIAGNOSIS — M9901 Segmental and somatic dysfunction of cervical region: Secondary | ICD-10-CM | POA: Diagnosis not present

## 2019-04-10 MED FILL — VENLAFAXINE HCL ER 150 MG T: 150 | 30 days supply | Qty: 30 | Fill #1

## 2019-04-11 ENCOUNTER — Other Ambulatory Visit: Payer: Self-pay

## 2019-04-11 ENCOUNTER — Encounter: Payer: Self-pay | Admitting: Family Medicine

## 2019-04-11 ENCOUNTER — Ambulatory Visit (INDEPENDENT_AMBULATORY_CARE_PROVIDER_SITE_OTHER): Payer: 59 | Admitting: Family Medicine

## 2019-04-11 VITALS — BP 161/88 | HR 70 | Ht 64.5 in | Wt 239.0 lb

## 2019-04-11 DIAGNOSIS — E039 Hypothyroidism, unspecified: Secondary | ICD-10-CM | POA: Diagnosis not present

## 2019-04-11 DIAGNOSIS — E559 Vitamin D deficiency, unspecified: Secondary | ICD-10-CM | POA: Diagnosis not present

## 2019-04-11 DIAGNOSIS — M79601 Pain in right arm: Secondary | ICD-10-CM

## 2019-04-11 DIAGNOSIS — E669 Obesity, unspecified: Secondary | ICD-10-CM

## 2019-04-11 DIAGNOSIS — M25511 Pain in right shoulder: Secondary | ICD-10-CM

## 2019-04-11 DIAGNOSIS — E782 Mixed hyperlipidemia: Secondary | ICD-10-CM | POA: Diagnosis not present

## 2019-04-11 DIAGNOSIS — R748 Abnormal levels of other serum enzymes: Secondary | ICD-10-CM

## 2019-04-11 DIAGNOSIS — R079 Chest pain, unspecified: Secondary | ICD-10-CM | POA: Diagnosis not present

## 2019-04-11 DIAGNOSIS — K219 Gastro-esophageal reflux disease without esophagitis: Secondary | ICD-10-CM

## 2019-04-11 DIAGNOSIS — I10 Essential (primary) hypertension: Secondary | ICD-10-CM | POA: Diagnosis not present

## 2019-04-11 DIAGNOSIS — E1169 Type 2 diabetes mellitus with other specified complication: Secondary | ICD-10-CM | POA: Diagnosis not present

## 2019-04-11 DIAGNOSIS — G8929 Other chronic pain: Secondary | ICD-10-CM

## 2019-04-11 DIAGNOSIS — R0789 Other chest pain: Secondary | ICD-10-CM

## 2019-04-11 MED ORDER — ASPIRIN EC 81 MG PO TBEC: 81.0000 mg | DELAYED_RELEASE_TABLET | Freq: Every day | ORAL | Status: DC

## 2019-04-11 MED ORDER — FAMOTIDINE 40 MG PO TABS: 40.0000 mg | ORAL_TABLET | Freq: Every day | ORAL | 2 refills | Status: DC

## 2019-04-11 MED FILL — FAMOTIDINE 40 MG TABLET: 40 | 30 days supply | Qty: 30 | Fill #0

## 2019-04-15 ENCOUNTER — Other Ambulatory Visit (INDEPENDENT_AMBULATORY_CARE_PROVIDER_SITE_OTHER): Payer: 59

## 2019-04-15 DIAGNOSIS — E669 Obesity, unspecified: Secondary | ICD-10-CM

## 2019-04-15 DIAGNOSIS — E559 Vitamin D deficiency, unspecified: Secondary | ICD-10-CM | POA: Diagnosis not present

## 2019-04-15 DIAGNOSIS — E782 Mixed hyperlipidemia: Secondary | ICD-10-CM

## 2019-04-15 DIAGNOSIS — E1169 Type 2 diabetes mellitus with other specified complication: Secondary | ICD-10-CM | POA: Diagnosis not present

## 2019-04-15 DIAGNOSIS — R748 Abnormal levels of other serum enzymes: Secondary | ICD-10-CM

## 2019-04-15 DIAGNOSIS — I10 Essential (primary) hypertension: Secondary | ICD-10-CM | POA: Diagnosis not present

## 2019-04-15 LAB — LIPID PANEL
Cholesterol: 226 mg/dL — ABNORMAL HIGH (ref 0–200)
HDL: 49.9 mg/dL (ref >39.00)
LDL Cholesterol: 154 mg/dL — ABNORMAL HIGH (ref 0–99)
NonHDL: 175.85
Total CHOL/HDL Ratio: 5
Triglycerides: 107 mg/dL (ref 0.0–149.0)
VLDL: 21.4 mg/dL (ref 0.0–40.0)

## 2019-04-15 LAB — CBC
HCT: 41.9 % (ref 36.0–46.0)
Hemoglobin: 13.7 g/dL (ref 12.0–15.0)
MCHC: 32.7 g/dL (ref 30.0–36.0)
MCV: 90.7 fl (ref 78.0–100.0)
Platelets: 401 10*3/uL — ABNORMAL HIGH (ref 150.0–400.0)
RBC: 4.62 Mil/uL (ref 3.87–5.11)
RDW: 13.9 % (ref 11.5–15.5)
WBC: 5.3 10*3/uL (ref 4.0–10.5)

## 2019-04-15 LAB — COMPREHENSIVE METABOLIC PANEL
ALT: 19 U/L (ref 0–35)
AST: 22 U/L (ref 0–37)
Albumin: 4.5 g/dL (ref 3.5–5.2)
Alkaline Phosphatase: 50 U/L (ref 39–117)
BUN: 14 mg/dL (ref 6–23)
CO2: 24 mEq/L (ref 19–32)
Calcium: 9.9 mg/dL (ref 8.4–10.5)
Chloride: 103 mEq/L (ref 96–112)
Creatinine, Ser: 0.98 mg/dL (ref 0.40–1.20)
GFR: 70.44 mL/min (ref >60.00)
Glucose, Bld: 89 mg/dL (ref 70–99)
Potassium: 3.9 mEq/L (ref 3.5–5.1)
Sodium: 139 mEq/L (ref 135–145)
Total Bilirubin: 0.4 mg/dL (ref 0.2–1.2)
Total Protein: 7.3 g/dL (ref 6.0–8.3)

## 2019-04-15 LAB — HEMOGLOBIN A1C: Hgb A1c MFr Bld: 6 % (ref 4.6–6.5)

## 2019-04-15 LAB — TSH: TSH: 0.96 u[IU]/mL (ref 0.35–4.50)

## 2019-04-15 LAB — CK: Total CK: 475 U/L — ABNORMAL HIGH (ref 7–177)

## 2019-04-15 LAB — VITAMIN D 25 HYDROXY (VIT D DEFICIENCY, FRACTURES): VITD: 44.12 ng/mL (ref 30.00–100.00)

## 2019-04-16 NOTE — Assessment & Plan Note (Signed)
And reight shoulder pain starting to affect her activity and rom is referred to sports med for further evaluation

## 2019-04-16 NOTE — Assessment & Plan Note (Signed)
hgba1c acceptable, minimize simple carbs. Increase exercise as tolerated. Continue current meds 

## 2019-04-16 NOTE — Assessment & Plan Note (Addendum)
Noting some right upper chest wall pain that is worsening, may be musculoskeletal but with her numerous risk factors have referred to cardiology for further evaluation. She will seek care if she is worsening.

## 2019-04-16 NOTE — Assessment & Plan Note (Signed)
Monitor and report any concerns. no changes to meds. Encouraged heart healthy diet such as the DASH diet and exercise as tolerated.  

## 2019-04-16 NOTE — Assessment & Plan Note (Signed)
Encouraged heart healthy diet, increase exercise, avoid trans fats, consider a krill oil cap daily 

## 2019-04-16 NOTE — Progress Notes (Signed)
Virtual Visit via Video Note  I connected with [name] on 04/11/19 at 10:20 AM EST by a video enabled telemedicine application and verified that I am speaking with the correct person using two identifiers.  Location: Patient: home Provider: home   I discussed the limitations of evaluation and management by telemedicine and the availability of in person appointments. The patient expressed understanding and agreed to proceed. Magdalene Molly, CMA was able to get the patient set up on a visit, video   Subjective:    Patient ID: [name], female    DOB: 1961-02-26, 58 y.o.   MRN: [id number]  Chief Complaint  Patient presents with  . Diabetes  . Hypertension  . Tachycardia  . Hypothyroidism  . Vitamin D Def    HPI Patient is in today for follow up on chronic medical concerns including hypertension, diabetes, hyperlipidemia and more. She has been struggling with right shoulder and right arm pain for quite some time but is now noting episodes of increased right arm pain associated with right upper chest wall pain and intermittent palpitations usually occurring at work and improving with rest some mild dyspepsia at times also noted. No recent febrile illness or hospitalizations. Denies SOB/HA/congestion/fevers or GU c/o. Taking meds as prescribed  Past Medical History:  Diagnosis Date  . Acute bronchitis 05/25/2016  . Anxiety   . Chronic pain syndrome 01/06/2013  . Chronic rhinosinusitis   . Colon polyps   . Cough 01/06/2013  . Depression   . Diabetes ([place]) 01/06/2013  . Diabetes mellitus type 2 in obese [facility]) 01/06/2013   Sees Dr Calvert Cantor for eye exam Does not see podiatry, foot exam today unremarkable except for thick cracking skin on heals   . Dysuria 05/25/2016  . Edema 01/06/2013  . Epistaxis 05/25/2016  . Fibroid, uterine   . GERD (gastroesophageal reflux disease)   . Glaucoma 12-13  . Hoarseness   . Hoarseness of voice 05/11/2013  . Hyperglycemia 04/13/2013  .  Hyperlipemia, mixed 06/06/2007   Qualifier: Diagnosis of  By: Wynona Luna She feels Lipitor caused increased low back pain and weakness    . Hyperlipidemia   . Hypertension   . Hypokalemia 02/05/2013  . IBS (irritable bowel syndrome) 02/13/2011  . Low back pain 03/03/2013  . Muscle cramp 05/22/2014  . Obesity   . Obesity, unspecified 05/11/2013  . OSA (obstructive sleep apnea)   . Pain in joint, shoulder region 06/27/2015  . Perimenopause 10/05/2012  . Raynaud disease 06/16/2013  . Thyroid disease    hypothyroidism  . Vocal fold nodules     Past Surgical History:  Procedure Laterality Date  . ABDOMINAL HYSTERECTOMY    . BREAST EXCISIONAL BIOPSY Right    at age 30  . CHOLECYSTECTOMY    . CYSTECTOMY    . DILATION AND CURETTAGE OF UTERUS     x2  . I & D EXTREMITY Left 04/11/2018   Procedure: DEBIDMENT DISTAL INTERPHALANGEAL LEFT MIDDLE;  Surgeon: Daryll Brod, MD;  Location: [place];  Service: Orthopedics;  Laterality: Left;  Marland Kitchen MASS EXCISION Left 04/11/2018   Procedure: EXCISION MASS;  Surgeon: Daryll Brod, MD;  Location: [place];  Service: Orthopedics;  Laterality: Left;  . OOPHORECTOMY    . POLYPECTOMY    . ROTATOR CUFF REPAIR      Family History  Problem Relation Age of Onset  . Alcohol abuse Father   . Cancer Father  renal and colon  . Hyperlipidemia Father   . Hypertension Father   . Stroke Father   . Heart disease Father   . Cancer Paternal Grandfather        colon  . Diabetes Other   . High blood pressure Mother   . High Cholesterol Mother   . Kidney disease Mother   . Depression Mother   . Anxiety disorder Mother   . Obesity Mother   . Breast cancer Other 45  . Diabetes Sister   . Diabetes Brother     Social History   Socioeconomic History  . Marital status: Significant Other    Spouse name: Not on file  . Number of children: 0  . Years of education: Not on file  . Highest education level: Not on file    Occupational History  . 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 Smoking status: Former Smoker    Packs/day: 0.50    Years: 30.00    Pack years: 15.00    Types: Cigarettes    Quit date: 04/25/2003    Years since quitting: 15.9  . Smokeless tobacco: Never Used  . Tobacco comment: smoked since age 2  Substance and Sexual Activity  . Alcohol use: No  . Drug use: Never  . Sexual activity: Yes    Partners: Male  Other Topics Concern  . Not on file  Social History Narrative   Endo-- Dr Dwyane Dee   ENT--Dr shoemaker   GI--Dr Buccini   Pulm--Dr Clance   Rheum--Dr Charlestine Night         Social Determinants of Health   Financial Resource Strain:   . Difficulty of Paying Living Expenses: Not on file  Food Insecurity:   . Worried About Charity fundraiser in the Last Year: Not on file  . Ran Out of Food in the Last Year: Not on file  Transportation Needs:   . Lack of Transportation (Medical): Not on file  . Lack of Transportation (Non-Medical): Not on file  Physical Activity:   . Days of Exercise per Week: Not on file  . Minutes of Exercise per Session: Not on file  Stress:   . Feeling of Stress : Not on file  Social Connections:   . Frequency of Communication with Friends and Family: Not on file  . Frequency of Social Gatherings with Friends and Family: Not on file  . Attends Religious Services: Not on file  . Active Member of Clubs or Organizations: Not on file  . Attends Archivist Meetings: Not on file  . Marital Status: Not on file  Intimate Partner Violence:   . Fear of Current or Ex-Partner: Not on file  . Emotionally Abused: Not on file  . Physically Abused: Not on file  . Sexually Abused: Not on file    Outpatient Medications Prior to Visit  Medication Sig Dispense Refill  . acetaminophen (TYLENOL) 325 MG tablet Take 650 mg by mouth every 6 (six) hours as needed.    Marland Kitchen albuterol (VENTOLIN HFA) 108 (90 Base) MCG/ACT inhaler INHALE 2 PUFFS INTO THE  LUNGS EVERY 4 HOURS AS NEEDED FOR WHEEZING OR SHORTNESS OF BREATH. 18 g 6  . Cod Liver Oil 1000 MG CAPS Take 1 capsule by mouth daily.    . diphenhydrAMINE (BENADRYL) 25 MG tablet Take 25 mg by mouth every 6 (six) hours as needed.    . fenofibrate micronized (LOFIBRA) 134 MG capsule TAKE 1 CAPSULE BY MOUTH DAILY BEFORE BREAKFAST  90 capsule 2  . furosemide (LASIX) 20 MG tablet TAKE 1 TO 2 TABLETS BY MOUTH DAILY AS NEEDED FOR FLUID OR EDEMA 40 tablet 1  . gabapentin (NEURONTIN) 300 MG capsule Take 1 capsule (300 mg total) by mouth 3 (three) times daily. 270 capsule 1  . hyoscyamine (LEVSIN SL) 0.125 MG SL tablet Place 1 tablet (0.125 mg total) under the tongue every 4 (four) hours as needed. 40 tablet 1  . linaclotide (LINZESS) 145 MCG CAPS capsule Take 1 capsule (145 mcg total) by mouth daily before breakfast. 30 capsule 5  . loratadine (CLARITIN) 10 MG tablet Take 10 mg by mouth daily.    . metFORMIN (GLUCOPHAGE) 1000 MG tablet Take 1 tablet (1,000 mg total) by mouth daily with breakfast. 90 tablet 1  . metoprolol succinate (TOPROL-XL) 100 MG 24 hr tablet Take 1 tablet (100 mg total) by mouth daily. Take with or immediately following a meal. 30 tablet 1  . modafinil (PROVIGIL) 200 MG tablet Take 2 tablets (400 mg total) by mouth daily. 90 tablet 1  . spironolactone (ALDACTONE) 25 MG tablet Take 2 tablets (50 mg total) by mouth daily. 180 tablet 2  . SYNTHROID 100 MCG tablet Take 1 tablet (100 mcg total) by mouth daily before breakfast. 90 tablet 1  . Venlafaxine HCl 150 MG TB24 Take 1 tablet (150 mg total) by mouth at bedtime. 30 tablet 1  . Vitamin D, Ergocalciferol, (DRISDOL) 1.25 MG (50000 UT) CAPS capsule Take 1 capsule (50,000 Units total) by mouth every 7 (seven) days. 4 capsule 4   No facility-administered medications prior to visit.    Allergies  Allergen Reactions  . Losartan Cough  . Wellbutrin [Bupropion] Other (See Comments)    Dizziness and mental status changes  . Atorvastatin      myalgias  . Codeine   . Amoxicillin Rash  . Aspirin Nausea Only    GI upset REACTION: Upset stomach  . Erythromycin Rash  . Penicillins Rash    Review of Systems  Constitutional: Positive for malaise/fatigue. Negative for fever.  HENT: Negative for congestion.   Eyes: Negative for blurred vision.  Respiratory: Negative for shortness of breath.   Cardiovascular: Positive for chest pain and palpitations. Negative for leg swelling.  Gastrointestinal: Negative for abdominal pain, blood in stool and nausea.  Genitourinary: Negative for dysuria and frequency.  Musculoskeletal: Positive for joint pain and myalgias. Negative for falls.  Skin: Negative for rash.  Neurological: Negative for dizziness, loss of consciousness and headaches.  Endo/Heme/Allergies: Negative for environmental allergies.  Psychiatric/Behavioral: Negative for depression. The patient is not nervous/anxious.        Objective:    Physical Exam Constitutional:      Appearance: Normal appearance. She is not ill-appearing.  HENT:     Head: Normocephalic and atraumatic.     Nose: Nose normal.  Eyes:     General:        Right eye: No discharge.        Left eye: No discharge.  Pulmonary:     Effort: Pulmonary effort is normal.  Neurological:     Mental Status: She is alert and oriented to person, place, and time.  Psychiatric:        Behavior: Behavior normal.     BP (!) 161/88 (BP Location: Left Arm, Patient Position: Sitting, Cuff Size: Large)   Pulse 70   Ht 5' 4.5" (1.638 m)   Wt 239 lb (108.4 kg)   BMI 40.39 kg/m  Wt Readings from Last 3 Encounters:  04/11/19 239 lb (108.4 kg)  01/16/19 244 lb 12.8 oz (111 kg)  05/21/18 238 lb 12.8 oz (108.3 kg)    Diabetic Foot Exam - Simple   No data filed     Lab Results  Component Value Date   WBC 5.3 04/15/2019   HGB 13.7 04/15/2019   HCT 41.9 04/15/2019   PLT 401.0 (H) 04/15/2019   GLUCOSE 89 04/15/2019   CHOL 226 (H) 04/15/2019   TRIG 107.0  04/15/2019   HDL 49.90 04/15/2019   LDLDIRECT 103.0 07/11/2016   LDLCALC 154 (H) 04/15/2019   ALT 19 04/15/2019   AST 22 04/15/2019   NA 139 04/15/2019   K 3.9 04/15/2019   CL 103 04/15/2019   CREATININE 0.98 04/15/2019   BUN 14 04/15/2019   CO2 24 04/15/2019   TSH 0.96 04/15/2019   HGBA1C 6.0 04/15/2019   MICROALBUR 0.7 10/11/2016    Lab Results  Component Value Date   TSH 0.96 04/15/2019   Lab Results  Component Value Date   WBC 5.3 04/15/2019   HGB 13.7 04/15/2019   HCT 41.9 04/15/2019   MCV 90.7 04/15/2019   PLT 401.0 (H) 04/15/2019   Lab Results  Component Value Date   NA 139 04/15/2019   K 3.9 04/15/2019   CO2 24 04/15/2019   GLUCOSE 89 04/15/2019   BUN 14 04/15/2019   CREATININE 0.98 04/15/2019   BILITOT 0.4 04/15/2019   ALKPHOS 50 04/15/2019   AST 22 04/15/2019   ALT 19 04/15/2019   PROT 7.3 04/15/2019   ALBUMIN 4.5 04/15/2019   CALCIUM 9.9 04/15/2019   ANIONGAP 10 04/04/2018   GFR 70.44 04/15/2019   Lab Results  Component Value Date   CHOL 226 (H) 04/15/2019   Lab Results  Component Value Date   HDL 49.90 04/15/2019   Lab Results  Component Value Date   LDLCALC 154 (H) 04/15/2019   Lab Results  Component Value Date   TRIG 107.0 04/15/2019   Lab Results  Component Value Date   CHOLHDL 5 04/15/2019   Lab Results  Component Value Date   HGBA1C 6.0 04/15/2019       Assessment & Plan:   Problem List Items Addressed This Visit    Hypothyroidism   Hyperlipemia, mixed    Encouraged heart healthy diet, increase exercise, avoid trans fats, consider a krill oil cap daily      Relevant Medications   aspirin EC 81 MG tablet   Other Relevant Orders   Lipid panel (Completed)   Essential hypertension    Monitor and report any concerns no changes to meds. Encouraged heart healthy diet such as the DASH diet and exercise as tolerated.       Relevant Medications   aspirin EC 81 MG tablet   Other Relevant Orders   CBC (Completed)   CMP  (Completed)   TSH (Completed)   GERD    Avoid offending foods, start probiotics. Do not eat large meals in late evening and consider raising head of bed. Start Famotidine      Relevant Medications   famotidine (PEPCID) 40 MG tablet   Vitamin D deficiency    Supplement and monitor      Relevant Orders   VITAMIN D (Completed)   Diabetes mellitus type 2 in obese (HCC)    hgba1c acceptable, minimize simple carbs. Increase exercise as tolerated. Continue current meds      Relevant Medications   aspirin EC 81  MG tablet   Other Relevant Orders   A1C (Completed)   Atypical chest pain    Noting some right upper chest wall pain that is worsening, may be musculoskeletal but with her numerous risk factors have referred to cardiology for further evaluation. She will seek care if she is worsening.       Right arm pain    And reight shoulder pain starting to affect her activity and rom is referred to sports med for further evaluation       Other Visit Diagnoses    Chest pain, unspecified type    -  Primary   Relevant Orders   Ambulatory referral to Cardiology   Chronic right shoulder pain       Relevant Medications   aspirin EC 81 MG tablet   Other Relevant Orders   Ambulatory referral to Sports Medicine   Elevated CK       Relevant Orders   CK (Creatine Kinase) (Completed)      I am having Lakelyn S. Clouse start on aspirin EC and famotidine. I am also having her maintain her albuterol, Cod Liver Oil, loratadine, acetaminophen, diphenhydrAMINE, fenofibrate micronized, spironolactone, metFORMIN, Synthroid, linaclotide, hyoscyamine, gabapentin, modafinil, Vitamin D (Ergocalciferol), furosemide, metoprolol succinate, and Venlafaxine HCl.  Meds ordered this encounter  Medications  . aspirin EC 81 MG tablet    Sig: Take 1 tablet (81 mg total) by mouth daily.    Dispense:     . famotidine (PEPCID) 40 MG tablet    Sig: Take 1 tablet (40 mg total) by mouth at bedtime.    Dispense:   30 tablet    Refill:  2    I discussed the assessment and treatment plan with the patient. The patient was provided an opportunity to ask questions and all were answered. The patient agreed with the plan and demonstrated an understanding of the instructions.   The patient was advised to call back or seek an in-person evaluation if the symptoms worsen or if the condition fails to improve as anticipated.  I provided 45 minutes of non-face-to-face time during this encounter.   Penni Homans, MD

## 2019-04-16 NOTE — Assessment & Plan Note (Signed)
Supplement and monitor 

## 2019-04-16 NOTE — Assessment & Plan Note (Signed)
Avoid offending foods, start probiotics. Do not eat large meals in late evening and consider raising head of bed. Start Famotidine

## 2019-04-17 ENCOUNTER — Ambulatory Visit: Payer: 59 | Admitting: Cardiology

## 2019-04-17 ENCOUNTER — Encounter: Payer: Self-pay | Admitting: Cardiology

## 2019-04-17 ENCOUNTER — Other Ambulatory Visit: Payer: Self-pay

## 2019-04-17 VITALS — BP 142/90 | HR 70 | Ht 64.5 in | Wt 240.0 lb

## 2019-04-17 DIAGNOSIS — E669 Obesity, unspecified: Secondary | ICD-10-CM | POA: Diagnosis not present

## 2019-04-17 DIAGNOSIS — R0789 Other chest pain: Secondary | ICD-10-CM

## 2019-04-17 DIAGNOSIS — I10 Essential (primary) hypertension: Secondary | ICD-10-CM | POA: Diagnosis not present

## 2019-04-17 DIAGNOSIS — E782 Mixed hyperlipidemia: Secondary | ICD-10-CM | POA: Diagnosis not present

## 2019-04-17 DIAGNOSIS — E119 Type 2 diabetes mellitus without complications: Secondary | ICD-10-CM

## 2019-04-17 DIAGNOSIS — R200 Anesthesia of skin: Secondary | ICD-10-CM | POA: Insufficient documentation

## 2019-04-17 DIAGNOSIS — E1169 Type 2 diabetes mellitus with other specified complication: Secondary | ICD-10-CM | POA: Diagnosis not present

## 2019-04-17 DIAGNOSIS — R202 Paresthesia of skin: Secondary | ICD-10-CM | POA: Insufficient documentation

## 2019-04-17 NOTE — Patient Instructions (Addendum)
Medication Instructions:  Your physician recommends that you continue on your current medications as directed. Please refer to the Current Medication list given to you today.  If you need a refill on your cardiac medications before your next appointment, please call your pharmacy.   Lab work: NONE If you have labs (blood work) drawn today and your tests are completely normal, you will receive your results only by: Marland Kitchen MyChart Message (if you have MyChart) OR . A paper copy in the mail If you have any lab test that is abnormal or we need to change your treatment, we will call you to review the results.  Testing/Procedures: You had an EKG performed today.  Your physician has requested that you have an echocardiogram. Echocardiography is a painless test that uses sound waves to create images of your heart. It provides your doctor with information about the size and shape of your heart and how well your heart's chambers and valves are working. This procedure takes approximately one hour. There are no restrictions for this procedure.  Your physician has requested that you have a lexiscan myoview. For further information please visit [email or web address]. Please follow instruction sheet, as given.    Follow-Up: At [facility], you and your health needs are our priority.  As part of our continuing mission to provide you with exceptional heart care, we have created designated Provider Care Teams.  These Care Teams include your primary Cardiologist (physician) and Advanced Practice Providers (APPs -  Physician Assistants and Nurse Practitioners) who all work together to provide you with the care you need, when you need it. You will need a follow up appointment in 2 months.   Any Other Special Instructions Will Be Listed Below  Regadenoson injection What is this medicine? REGADENOSON is used to test the heart for coronary artery disease. It is used in patients who can not exercise for their stress  test. This medicine may be used for other purposes; ask your health care provider or pharmacist if you have questions. COMMON BRAND NAME(S): Lexiscan What should I tell my health care provider before I take this medicine? They need to know if you have any of these conditions:  heart problems  lung or breathing disease, like asthma or COPD  an unusual or allergic reaction to regadenoson, other medicines, foods, dyes, or preservatives  pregnant or trying to get pregnant  breast-feeding How should I use this medicine? This medicine is for injection into a vein. It is given by a health care professional in a hospital or clinic setting. Talk to your pediatrician regarding the use of this medicine in children. Special care may be needed. Overdosage: If you think you have taken too much of this medicine contact a poison control center or emergency room at once. NOTE: This medicine is only for you. Do not share this medicine with others. What if I miss a dose? This does not apply. What may interact with this medicine?  caffeine  dipyridamole  guarana  theophylline This list may not describe all possible interactions. Give your health care provider a list of all the medicines, herbs, non-prescription drugs, or dietary supplements you use. Also tell them if you smoke, drink alcohol, or use illegal drugs. Some items may interact with your medicine. What should I watch for while using this medicine? Your condition will be monitored carefully while you are receiving this medicine. Do not take medicines, foods, or drinks with caffeine (like coffee, tea, or colas) for at least 12 hours  before your test. If you do not know if something contains caffeine, ask your health care professional. What side effects may I notice from receiving this medicine? Side effects that you should report to your doctor or health care professional as soon as possible:  allergic reactions like skin rash, itching or  hives, swelling of the face, lips, or tongue  breathing problems  chest pain, tightness or palpitations  severe headache Side effects that usually do not require medical attention (report to your doctor or health care professional if they continue or are bothersome):  flushing  headache  irritation or pain at site where injected  nausea, vomiting This list may not describe all possible side effects. Call your doctor for medical advice about side effects. You may report side effects to FDA at 1-800-FDA-1088. Where should I keep my medicine? This drug is given in a hospital or clinic and will not be stored at home. NOTE: This sheet is a summary. It may not cover all possible information. If you have questions about this medicine, talk to your doctor, pharmacist, or health care provider.  2020 Elsevier/Gold Standard (2007-12-09 15:08:13)  Cardiac Nuclear Scan A cardiac nuclear scan is a test that is done to check the flow of blood to your heart. It is done when you are resting and when you are exercising. The test looks for problems such as:  Not enough blood reaching a portion of the heart.  The heart muscle not working as it should. You may need this test if:  You have heart disease.  You have had lab results that are not normal.  You have had heart surgery or a balloon procedure to open up blocked arteries (angioplasty).  You have chest pain.  You have shortness of breath. In this test, a special dye (tracer) is put into your bloodstream. The tracer will travel to your heart. A camera will then take pictures of your heart to see how the tracer moves through your heart. This test is usually done at a hospital and takes 2-4 hours. Tell a doctor about:  Any allergies you have.  All medicines you are taking, including vitamins, herbs, eye drops, creams, and over-the-counter medicines.  Any problems you or family members have had with anesthetic medicines.  Any blood  disorders you have.  Any surgeries you have had.  Any medical conditions you have.  Whether you are pregnant or may be pregnant. What are the risks? Generally, this is a safe test. However, problems may occur, such as:  Serious chest pain and heart attack. This is only a risk if the stress portion of the test is done.  Rapid heartbeat.  A feeling of warmth in your chest. This feeling usually does not last long.  Allergic reaction to the tracer. What happens before the test?  Ask your doctor about changing or stopping your normal medicines. This is important.  Follow instructions from your doctor about what you cannot eat or drink.  Remove your jewelry on the day of the test. What happens during the test?  An IV tube will be inserted into one of your veins.  Your doctor will give you a small amount of tracer through the IV tube.  You will wait for 20-40 minutes while the tracer moves through your bloodstream.  Your heart will be monitored with an electrocardiogram (ECG).  You will lie down on an exam table.  Pictures of your heart will be taken for about 15-20 minutes.  You may   also have a stress test. For this test, one of these things may be done: ? You will be asked to exercise on a treadmill or a stationary bike. ? You will be given medicines that will make your heart work harder. This is done if you are unable to exercise.  When blood flow to your heart has peaked, a tracer will again be given through the IV tube.  After 20-40 minutes, you will get back on the exam table. More pictures will be taken of your heart.  Depending on the tracer that is used, more pictures may need to be taken 3-4 hours later.  Your IV tube will be removed when the test is over. The test may vary among doctors and hospitals. What happens after the test?  Ask your doctor: ? Whether you can return to your normal schedule, including diet, activities, and medicines. ? Whether you should  drink more fluids. This will help to remove the tracer from your body. Drink enough fluid to keep your pee (urine) pale yellow.  Ask your doctor, or the department that is doing the test: ? When will my results be ready? ? How will I get my results? Summary  A cardiac nuclear scan is a test that is done to check the flow of blood to your heart.  Tell your doctor whether you are pregnant or may be pregnant.  Before the test, ask your doctor about changing or stopping your normal medicines. This is important.  Ask your doctor whether you can return to your normal activities. You may be asked to drink more fluids. This information is not intended to replace advice given to you by your health care provider. Make sure you discuss any questions you have with your health care provider. Document Released: 09/24/2017 Document Revised: 07/31/2018 Document Reviewed: 09/24/2017 Elsevier Patient Education  2020 Elsevier Inc.  Echocardiogram An echocardiogram is a procedure that uses painless sound waves (ultrasound) to produce an image of the heart. Images from an echocardiogram can provide important information about:  Signs of coronary artery disease (CAD).  Aneurysm detection. An aneurysm is a weak or damaged part of an artery wall that bulges out from the normal force of blood pumping through the body.  Heart size and shape. Changes in the size or shape of the heart can be associated with certain conditions, including heart failure, aneurysm, and CAD.  Heart muscle function.  Heart valve function.  Signs of a past heart attack.  Fluid buildup around the heart.  Thickening of the heart muscle.  A tumor or infectious growth around the heart valves. Tell a health care provider about:  Any allergies you have.  All medicines you are taking, including vitamins, herbs, eye drops, creams, and over-the-counter medicines.  Any blood disorders you have.  Any surgeries you have had.  Any  medical conditions you have.  Whether you are pregnant or may be pregnant. What are the risks? Generally, this is a safe procedure. However, problems may occur, including:  Allergic reaction to dye (contrast) that may be used during the procedure. What happens before the procedure? No specific preparation is needed. You may eat and drink normally. What happens during the procedure?   An IV tube may be inserted into one of your veins.  You may receive contrast through this tube. A contrast is an injection that improves the quality of the pictures from your heart.  A gel will be applied to your chest.  A wand-like tool (transducer)   will be moved over your chest. The gel will help to transmit the sound waves from the transducer.  The sound waves will harmlessly bounce off of your heart to allow the heart images to be captured in real-time motion. The images will be recorded on a computer. The procedure may vary among health care providers and hospitals. What happens after the procedure?  You may return to your normal, everyday life, including diet, activities, and medicines, unless your health care provider tells you not to do that. Summary  An echocardiogram is a procedure that uses painless sound waves (ultrasound) to produce an image of the heart.  Images from an echocardiogram can provide important information about the size and shape of your heart, heart muscle function, heart valve function, and fluid buildup around your heart.  You do not need to do anything to prepare before this procedure. You may eat and drink normally.  After the echocardiogram is completed, you may return to your normal, everyday life, unless your health care provider tells you not to do that. This information is not intended to replace advice given to you by your health care provider. Make sure you discuss any questions you have with your health care provider. Document Released: 04/07/2000 Document  Revised: 08/01/2018 Document Reviewed: 05/13/2016 Elsevier Patient Education  2020 [employer].

## 2019-04-17 NOTE — Progress Notes (Signed)
Cardiology Office Note:    Date:  04/17/2019   ID:  [name], DOB 10-20-1960, MRN [id number]  PCP:  Gina Lukes, MD  Cardiologist:  Gina Lindau, MD   Referring MD: Gina Lukes, MD    ASSESSMENT:    1. Essential hypertension   2. Atypical chest pain   3. Chest discomfort   4. Hyperlipemia, mixed   5. Diabetes mellitus type 2 in obese ([place])   6. Morbid obesity ([place])    PLAN:    In order of problems listed above:  1. Chest discomfort: Her symptoms are atypical.  Primary prevention stressed with the patient.  Importance of compliance with diet and medication stressed and she vocalized understanding.  In view of her symptoms she will undergo Lexiscan sestamibi. 2. Essential hypertension: Blood pressure is stable and diet was discussed.  Echocardiogram will be done to assess murmur heard on auscultation. 3. Mixed dyslipidemia: Lipids managed by primary care physician and patient is on statin therapy. 4. Diabetes mellitus and obesity: Diet was discussed.  Weight reduction stressed.  Risks of obesity explained and she vocalized understanding.  She promises to do better. 5. Patient will be seen in follow-up appointment in 2 months or earlier if the patient has any concerns    Medication Adjustments/Labs and Tests Ordered: Current medicines are reviewed at length with the patient today.  Concerns regarding medicines are outlined above.  Orders Placed This Encounter  Procedures  . MYOCARDIAL PERFUSION IMAGING  . EKG 12-Lead  . ECHOCARDIOGRAM COMPLETE   No orders of the defined types were placed in this encounter.    History of Present Illness:    [name] is a 58 y.o. female who is being seen today for the evaluation of chest discomfort at the request of Gina Lukes, MD.  Patient is a pleasant 58 year old female.  She works as a Scientist, research (medical) at [employer].  She has history of essential hypertension dyslipidemia and diabetes  mellitus and is morbidly obese.  She leads a sedentary lifestyle and does not exercise on a regular basis.  She mentions to me that she has discomfort in her right arm.  This occurs for several hours at a time.  No orthopnea or PND.  Occasionally she will have a burning sensation to the chest.  She does not exercise on a regular basis.  Stress may or may not bring around the symptoms.  She is not sexually active.  At the time of my evaluation, the patient is alert awake oriented and in no distress.  Past Medical History:  Diagnosis Date  . Acute bronchitis 05/25/2016  . Anxiety   . Chronic pain syndrome 01/06/2013  . Chronic rhinosinusitis   . Colon polyps   . Cough 01/06/2013  . Depression   . Diabetes ([place]) 01/06/2013  . Diabetes mellitus type 2 in obese [facility]) 01/06/2013   Sees Dr Calvert Cantor for eye exam Does not see podiatry, foot exam today unremarkable except for thick cracking skin on heals   . Dysuria 05/25/2016  . Edema 01/06/2013  . Epistaxis 05/25/2016  . Fibroid, uterine   . GERD (gastroesophageal reflux disease)   . Glaucoma 12-13  . Hoarseness   . Hoarseness of voice 05/11/2013  . Hyperglycemia 04/13/2013  . Hyperlipemia, mixed 06/06/2007   Qualifier: Diagnosis of  By: Wynona Luna She feels Lipitor caused increased low back pain and weakness    . Hyperlipidemia   . Hypertension   .  Hypokalemia 02/05/2013  . IBS (irritable bowel syndrome) 02/13/2011  . Low back pain 03/03/2013  . Muscle cramp 05/22/2014  . Obesity   . Obesity, unspecified 05/11/2013  . OSA (obstructive sleep apnea)   . Pain in joint, shoulder region 06/27/2015  . Perimenopause 10/05/2012  . Raynaud disease 06/16/2013  . Thyroid disease    hypothyroidism  . Vocal fold nodules     Past Surgical History:  Procedure Laterality Date  . ABDOMINAL HYSTERECTOMY    . BREAST EXCISIONAL BIOPSY Right    at age 5  . CHOLECYSTECTOMY    . CYSTECTOMY    . DILATION AND CURETTAGE OF UTERUS     x2  . I & D EXTREMITY  Left 04/11/2018   Procedure: DEBIDMENT DISTAL INTERPHALANGEAL LEFT MIDDLE;  Surgeon: Daryll Brod, MD;  Location: [facility];  Service: Orthopedics;  Laterality: Left;  Marland Kitchen MASS EXCISION Left 04/11/2018   Procedure: EXCISION MASS;  Surgeon: Daryll Brod, MD;  Location: [facility];  Service: Orthopedics;  Laterality: Left;  . OOPHORECTOMY    . POLYPECTOMY    . ROTATOR CUFF REPAIR      Current Medications: Current Meds  Medication Sig  . acetaminophen (TYLENOL) 325 MG tablet Take 650 mg by mouth every 6 (six) hours as needed.  Marland Kitchen aspirin EC 81 MG tablet Take 1 tablet (81 mg total) by mouth daily.  Marland Kitchen Cod Liver Oil 1000 MG CAPS Take 1 capsule by mouth daily.  . diphenhydrAMINE (BENADRYL) 25 MG tablet Take 25 mg by mouth every 6 (six) hours as needed.  . famotidine (PEPCID) 40 MG tablet Take 1 tablet (40 mg total) by mouth at bedtime.  . fenofibrate micronized (LOFIBRA) 134 MG capsule TAKE 1 CAPSULE BY MOUTH DAILY BEFORE BREAKFAST  . furosemide (LASIX) 20 MG tablet TAKE 1 TO 2 TABLETS BY MOUTH DAILY AS NEEDED FOR FLUID OR EDEMA  . gabapentin (NEURONTIN) 300 MG capsule Take 1 capsule (300 mg total) by mouth 3 (three) times daily.  . hyoscyamine (LEVSIN SL) 0.125 MG SL tablet Place 1 tablet (0.125 mg total) under the tongue every 4 (four) hours as needed.  . linaclotide (LINZESS) 145 MCG CAPS capsule Take 1 capsule (145 mcg total) by mouth daily before breakfast.  . loratadine (CLARITIN) 10 MG tablet Take 10 mg by mouth daily.  . metFORMIN (GLUCOPHAGE) 1000 MG tablet Take 1 tablet (1,000 mg total) by mouth daily with breakfast.  . metoprolol succinate (TOPROL-XL) 100 MG 24 hr tablet Take 1 tablet (100 mg total) by mouth daily. Take with or immediately following a meal.  . modafinil (PROVIGIL) 200 MG tablet Take 2 tablets (400 mg total) by mouth daily.  Marland Kitchen spironolactone (ALDACTONE) 25 MG tablet Take 2 tablets (50 mg total) by mouth daily.  Marland Kitchen SYNTHROID 100 MCG tablet Take 1  tablet (100 mcg total) by mouth daily before breakfast.  . Venlafaxine HCl 150 MG TB24 Take 1 tablet (150 mg total) by mouth at bedtime.  . Vitamin D, Ergocalciferol, (DRISDOL) 1.25 MG (50000 UT) CAPS capsule Take 1 capsule (50,000 Units total) by mouth every 7 (seven) days.     Allergies:   Losartan, Wellbutrin [bupropion], Atorvastatin, Codeine, Amoxicillin, Aspirin, Erythromycin, and Penicillins   Social History   Socioeconomic History  . Marital status: Significant Other    Spouse name: Not on file  . Number of children: 0  . Years of education: Not on file  . Highest education level: Not on file  Occupational History  .  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 Smoking status: Former Smoker    Packs/day: 0.50    Years: 30.00    Pack years: 15.00    Types: Cigarettes    Quit date: 04/25/2003    Years since quitting: 15.9  . Smokeless tobacco: Never Used  . Tobacco comment: smoked since age 4  Substance and Sexual Activity  . Alcohol use: No  . Drug use: Never  . Sexual activity: Yes    Partners: Male  Other Topics Concern  . Not on file  Social History Narrative   Endo-- Dr Dwyane Dee   ENT--Dr shoemaker   GI--Dr Buccini   Pulm--Dr Clance   Rheum--Dr Charlestine Night         Social Determinants of Health   Financial Resource Strain:   . Difficulty of Paying Living Expenses: Not on file  Food Insecurity:   . Worried About Charity fundraiser in the Last Year: Not on file  . Ran Out of Food in the Last Year: Not on file  Transportation Needs:   . Lack of Transportation (Medical): Not on file  . Lack of Transportation (Non-Medical): Not on file  Physical Activity:   . Days of Exercise per Week: Not on file  . Minutes of Exercise per Session: Not on file  Stress:   . Feeling of Stress : Not on file  Social Connections:   . Frequency of Communication with Friends and Family: Not on file  . Frequency of Social Gatherings with Friends and Family: Not on file  .  Attends Religious Services: Not on file  . Active Member of Clubs or Organizations: Not on file  . Attends Archivist Meetings: Not on file  . Marital Status: Not on file     Family History: The patient's family history includes Alcohol abuse in her father; Anxiety disorder in her mother; Breast cancer (age of onset: 23) in an other family member; Cancer in her father and paternal grandfather; Depression in her mother; Diabetes in her brother, sister, and another family member; Heart disease in her father; High Cholesterol in her mother; High blood pressure in her mother; Hyperlipidemia in her father; Hypertension in her father; Kidney disease in her mother; Obesity in her mother; Stroke in her father.  ROS:   Please see the history of present illness.    All other systems reviewed and are negative.  EKGs/Labs/Other Studies Reviewed:    The following studies were reviewed today: EKG reveals sinus rhythm left axis deviation and nonspecific ST-T changes.   Recent Labs: 12/12/2018: Magnesium 1.9 04/15/2019: ALT 19; BUN 14; Creatinine, Ser 0.98; Hemoglobin 13.7; Platelets 401.0; Potassium 3.9; Sodium 139; TSH 0.96  Recent Lipid Panel    Component Value Date/Time   CHOL 226 (H) 04/15/2019 0840   CHOL 210 (H) 07/03/2017 1002   TRIG 107.0 04/15/2019 0840   HDL 49.90 04/15/2019 0840   HDL 47 07/03/2017 1002   CHOLHDL 5 04/15/2019 0840   VLDL 21.4 04/15/2019 0840   LDLCALC 154 (H) 04/15/2019 0840   LDLCALC 146 (H) 07/03/2017 1002   LDLDIRECT 103.0 07/11/2016 0953    Physical Exam:    VS:  BP (!) 142/90 (BP Location: Right Arm, Patient Position: Sitting, Cuff Size: Normal)   Pulse 70   Ht 5' 4.5" (1.638 m)   Wt 240 lb (108.9 kg)   SpO2 96%   BMI 40.56 kg/m     Wt Readings from Last 3 Encounters:  04/17/19 240  lb (108.9 kg)  04/11/19 239 lb (108.4 kg)  01/16/19 244 lb 12.8 oz (111 kg)     GEN: Patient is in no acute distress HEENT: Normal NECK: No JVD; No carotid  bruits LYMPHATICS: No lymphadenopathy CARDIAC: S1 S2 regular, 2/6 systolic murmur at the apex. RESPIRATORY:  Clear to auscultation without rales, wheezing or rhonchi  ABDOMEN: Soft, non-tender, non-distended MUSCULOSKELETAL:  No edema; No deformity  SKIN: Warm and dry NEUROLOGIC:  Alert and oriented x 3 PSYCHIATRIC:  Normal affect    Signed, Gina Lindau, MD  04/17/2019 9:28 AM    [place]

## 2019-04-23 ENCOUNTER — Other Ambulatory Visit: Payer: Self-pay | Admitting: Family Medicine

## 2019-04-23 MED FILL — FUROSEMIDE 20 MG TABS: 20 | 20 days supply | Qty: 40 | Fill #0

## 2019-04-23 MED FILL — METOPROLOL SUCCINATE ER 100: 100 | 30 days supply | Qty: 30 | Fill #1

## 2019-04-24 ENCOUNTER — Other Ambulatory Visit: Payer: Self-pay | Admitting: Family Medicine

## 2019-04-24 ENCOUNTER — Encounter: Payer: Self-pay | Admitting: Family Medicine

## 2019-04-24 MED ORDER — SPIRONOLACTONE 25 MG PO TABS: 50.0000 mg | ORAL_TABLET | Freq: Three times a day (TID) | ORAL | 1 refills | Status: DC

## 2019-04-28 ENCOUNTER — Encounter: Payer: Self-pay | Admitting: Family Medicine

## 2019-04-28 ENCOUNTER — Other Ambulatory Visit: Payer: Self-pay | Admitting: Family Medicine

## 2019-04-28 ENCOUNTER — Ambulatory Visit (HOSPITAL_BASED_OUTPATIENT_CLINIC_OR_DEPARTMENT_OTHER)
Admission: RE | Admit: 2019-04-28 | Discharge: 2019-04-28 | Disposition: A | Discharge status: Home / Self Care | Payer: 59 | Source: Ambulatory Visit | Attending: Cardiology | Admitting: Cardiology

## 2019-04-28 ENCOUNTER — Telehealth (HOSPITAL_COMMUNITY): Payer: Self-pay | Admitting: *Deleted

## 2019-04-28 ENCOUNTER — Other Ambulatory Visit: Payer: Self-pay

## 2019-04-28 DIAGNOSIS — I10 Essential (primary) hypertension: Secondary | ICD-10-CM | POA: Diagnosis not present

## 2019-04-28 DIAGNOSIS — R0789 Other chest pain: Secondary | ICD-10-CM | POA: Insufficient documentation

## 2019-04-28 MED ORDER — SPIRONOLACTONE 25 MG PO TABS: 25.0000 mg | ORAL_TABLET | Freq: Three times a day (TID) | ORAL | 1 refills | Status: DC

## 2019-04-28 NOTE — Progress Notes (Signed)
  Echocardiogram 2D Echocardiogram has been performed.   Cardell Peach 04/28/2019, 1:40 PM

## 2019-04-28 NOTE — Telephone Encounter (Signed)
Left message on voicemail in reference to upcoming appointment scheduled for 04/29/18. Phone number given for a call back so details instructions can be given. Kirstie Peri

## 2019-04-28 NOTE — Telephone Encounter (Signed)
Please advise 

## 2019-04-30 ENCOUNTER — Ambulatory Visit (HOSPITAL_COMMUNITY): Payer: 59 | Attending: Cardiovascular Disease

## 2019-04-30 ENCOUNTER — Other Ambulatory Visit: Payer: Self-pay

## 2019-04-30 ENCOUNTER — Other Ambulatory Visit: Payer: Self-pay | Admitting: Family Medicine

## 2019-04-30 VITALS — Ht 64.5 in | Wt 240.0 lb

## 2019-04-30 DIAGNOSIS — R0789 Other chest pain: Secondary | ICD-10-CM | POA: Insufficient documentation

## 2019-04-30 MED ORDER — TECHNETIUM TC 99M TETROFOSMIN IV KIT
31.9000 | PACK | Freq: Once | INTRAVENOUS | Status: AC | PRN
  Administered 2019-04-30: 31.9 via INTRAVENOUS
  Filled 2019-04-30: qty 32

## 2019-04-30 MED ORDER — REGADENOSON 0.4 MG/5ML IV SOLN
0.4000 mg | Freq: Once | INTRAVENOUS | Status: AC
  Administered 2019-04-30: 0.4 mg via INTRAVENOUS

## 2019-04-30 MED FILL — GABAPENTIN 300 MG CAPSULE: 300 | 90 days supply | Qty: 270 | Fill #1

## 2019-05-01 ENCOUNTER — Ambulatory Visit (HOSPITAL_COMMUNITY): Payer: 59 | Attending: Cardiology

## 2019-05-01 LAB — MYOCARDIAL PERFUSION IMAGING
LV dias vol: 77 mL (ref 46–106)
LV sys vol: 30 mL
Peak HR: 96 {beats}/min
Rest HR: 67 {beats}/min
SDS: 3
SRS: 0
SSS: 3
TID: 1.1

## 2019-05-01 MED ORDER — TECHNETIUM TC 99M TETROFOSMIN IV KIT
31.6000 | PACK | Freq: Once | INTRAVENOUS | Status: AC | PRN
  Administered 2019-05-01: 31.6 via INTRAVENOUS
  Filled 2019-05-01: qty 32

## 2019-05-05 ENCOUNTER — Encounter: Payer: Self-pay | Admitting: Family Medicine

## 2019-05-05 MED FILL — SYNTHROID 100 MCG TABLET: 100 | 90 days supply | Qty: 90 | Fill #0

## 2019-05-08 ENCOUNTER — Encounter: Payer: Self-pay | Admitting: *Deleted

## 2019-05-08 MED FILL — VIT D2 1.25 MG (50,000 UNIT: 1.25 MG | 84 days supply | Qty: 12 | Fill #1

## 2019-05-12 ENCOUNTER — Other Ambulatory Visit: Payer: Self-pay | Admitting: Family Medicine

## 2019-05-12 ENCOUNTER — Telehealth: Payer: Self-pay

## 2019-05-12 MED FILL — FUROSEMIDE 20 MG TABS: 20 | 20 days supply | Qty: 40 | Fill #1

## 2019-05-12 MED FILL — MODAFINIL 200 MG TABLET: 200 | 45 days supply | Qty: 90 | Fill #1

## 2019-05-12 MED FILL — VENLAFAXINE HCL ER 150 MG T: 150 | 30 days supply | Qty: 30 | Fill #0

## 2019-05-12 MED FILL — FENOFIBRATE 134 MG CAPSULE: 134 | 90 days supply | Qty: 90 | Fill #1

## 2019-05-12 NOTE — Telephone Encounter (Signed)
Left message for patient to call office for results, copy sent to Dr. Charlett Blake

## 2019-05-12 NOTE — Telephone Encounter (Signed)
-----   Message from Jenean Lindau, MD sent at 04/28/2019  3:30 PM EST ----- The results of the study is unremarkable. Please inform patient. I will discuss in detail at next appointment. Cc  primary care/referring physician Jenean Lindau, MD 04/28/2019 3:30 PM

## 2019-05-13 NOTE — Telephone Encounter (Signed)
Left 2nd message for patient to call office for results.

## 2019-05-14 NOTE — Telephone Encounter (Signed)
Results relayed, no further questions. 

## 2019-05-16 ENCOUNTER — Other Ambulatory Visit: Payer: Self-pay

## 2019-05-16 ENCOUNTER — Encounter: Payer: Self-pay | Admitting: Family Medicine

## 2019-05-16 ENCOUNTER — Ambulatory Visit: Payer: 59 | Admitting: Family Medicine

## 2019-05-16 VITALS — BP 122/78 | HR 77 | Ht 64.5 in | Wt 242.2 lb

## 2019-05-16 DIAGNOSIS — M79601 Pain in right arm: Secondary | ICD-10-CM

## 2019-05-16 MED ORDER — PREDNISONE 50 MG PO TABS: 50.0000 mg | ORAL_TABLET | Freq: Every day | ORAL | 0 refills | Status: DC

## 2019-05-16 MED ORDER — PREGABALIN 75 MG PO CAPS: 75.0000 mg | ORAL_CAPSULE | Freq: Two times a day (BID) | ORAL | 3 refills | Status: DC

## 2019-05-16 MED FILL — predniSONE 50 MG TABS: 50 | 7 days supply | Qty: 7 | Fill #0

## 2019-05-16 MED FILL — PREGABALIN 75 MG CAPS: 75 | 30 days supply | Qty: 60 | Fill #0

## 2019-05-16 NOTE — Patient Instructions (Signed)
Thank you for coming in today. Stop gabapentin.  Try lyrica.  Take prednisone course.  If not improving next step is neck epidural steroid injection.  Keep me updated.  Recheck in 1 month (unless we are about to do the injection).   I think this is a pinched nerve in your neck.    Cervical Radiculopathy  Cervical radiculopathy happens when a nerve in the neck (a cervical nerve) is pinched or bruised. This condition can happen because of an injury to the cervical spine (vertebrae) in the neck, or as part of the normal aging process. Pressure on the cervical nerves can cause pain or numbness that travels from the neck all the way down into the arm and fingers. Usually, this condition gets better with rest. Treatment may be needed if the condition does not improve. What are the causes? This condition may be caused by:  A neck injury.  A bulging (herniated) disk.  Muscle spasms.  Muscle tightness in the neck because of overuse.  Arthritis.  Breakdown or degeneration in the bones and joints of the spine (spondylosis) due to aging.  Bone spurs that may develop near the cervical nerves. What are the signs or symptoms? Symptoms of this condition include:  Pain. The pain may travel from the neck to the arm and hand. The pain can be severe or irritating. It may be worse when you move your neck.  Numbness or tingling in your arm or hand.  Weakness in the affected arm and hand, in severe cases. How is this diagnosed? This condition may be diagnosed based on your symptoms, your medical history, and a physical exam. You may also have tests, including:  X-rays.  A CT scan.  An MRI.  An electromyogram (EMG).  Nerve conduction tests. How is this treated? In many cases, treatment is not needed for this condition. With rest, the condition usually gets better over time. If treatment is needed, options may include:  Wearing a soft neck collar (cervical collar) for short periods of  time, as told by your health care provider.  Doing physical therapy to strengthen your neck muscles.  Taking medicines, such as NSAIDs or oral corticosteroids.  Having spinal injections, in severe cases.  Having surgery. This may be needed if other treatments do not help. Different types of surgery may be done depending on the cause of this condition. Follow these instructions at home: If you have a cervical collar:  Wear it as told by your health care provider. Remove it only as told by your health care provider.  Ask your health care provider if you can remove the collar for cleaning and bathing. If you are allowed to remove the collar for cleaning or bathing: ? Follow instructions from your health care provider about how to remove the collar safely. ? Clean the collar by wiping it with mild soap and water and drying it completely. ? Take out any removable pads in the collar every 1-2 days, and wash them by hand with soap and water. Let them air-dry completely before you put them back in the collar. ? Check your skin under the collar for irritation or sores. If you see any, tell your health care provider. Managing pain      Take over-the-counter and prescription medicines only as told by your health care provider.  If directed, put ice on the affected area. ? If you have a soft neck collar, remove it as told by your health care provider. ? Put ice  in a plastic bag. ? Place a towel between your skin and the bag. ? Leave the ice on for 20 minutes, 2-3 times a day.  If applying ice does not help, you can try using heat. Use the heat source that your health care provider recommends, such as a moist heat pack or a heating pad. ? Place a towel between your skin and the heat source. ? Leave the heat on for 20-30 minutes. ? Remove the heat if your skin turns bright red. This is especially important if you are unable to feel pain, heat, or cold. You may have a greater risk of getting  burned.  Try a gentle neck and shoulder massage to help relieve symptoms. Activity  Rest as needed.  Return to your normal activities as told by your health care provider. Ask your health care provider what activities are safe for you.  Do stretching and strengthening exercises as told by your health care provider or physical therapist.  Do not lift anything that is heavier than 10 lb (4.5 kg) until your health care provider tells you that it is safe. General instructions  Use a flat pillow when you sleep.  Do not drive while wearing a cervical collar. If you do not have a cervical collar, ask your health care provider if it is safe to drive while your neck heals.  Ask your health care provider if the medicine prescribed to you requires you to avoid driving or using heavy machinery.  Do not use any products that contain nicotine or tobacco, such as cigarettes, e-cigarettes, and chewing tobacco. These can delay healing. If you need help quitting, ask your health care provider.  Keep all follow-up visits as told by your health care provider. This is important. Contact a health care provider if:  Your condition does not improve with treatment. Get help right away if:  Your pain gets much worse and cannot be controlled with medicines.  You have weakness or numbness in your hand, arm, face, or leg.  You have a high fever.  You have a stiff, rigid neck.  You lose control of your bowels or your bladder (have incontinence).  You have trouble with walking, balance, or speaking. Summary  Cervical radiculopathy happens when a nerve in the neck is pinched or bruised.  A nerve can get pinched from a bulging disk, arthritis, muscle spasms, or an injury to the neck.  Symptoms include pain, tingling, or numbness radiating from the neck into the arm or hand. Weakness can also occur in severe cases.  Treatment may include rest, wearing a cervical collar, and physical therapy. Medicines  may be prescribed to help with pain. In severe cases, injections or surgery may be needed. This information is not intended to replace advice given to you by your health care provider. Make sure you discuss any questions you have with your health care provider. Document Revised: 03/01/2018 Document Reviewed: 03/01/2018 Elsevier Patient Education  2020 [employer].

## 2019-05-16 NOTE — Progress Notes (Signed)
Subjective:    I'm seeing this patient as a consultation for:  Dr. Randel Pigg. Note will be routed back to referring provider/PCP.  CC: R shoulder and R arm pain  I, Molly Weber, LAT, ATC, am serving as scribe for Dr. Lynne Leader.  HPI: Pt is a 59 y/o female presenting w/ c/o R shoulder and radiating arm pain since March 2020.  She states that her symptoms started w/ R wrist and forearm pain that was thought to be carpal tunnel.  She also has c/o R-sided chest pain which is being followed by cardiology.  She rates her R shoulder and arm pain at a 7/10 during the day w/ activity and 5/10 at night and describes her pain as aching and throbbing.  She states that she has also more recently begun having pain in her R scapula.  She was seen by neurosurgery at [facility] for this issue.  [facility] which showed multilevel DDD and congenital spinal stenosis with multilevel neuroforaminal stenosis as well.  Over her situation was a bit confounded by nerve conduction study from 2019 and currently showing right-sided carpal tunnel syndrome.  Per patient neurosurgery concluded that her arm pain was due to carpal tunnel syndrome.  Patient notes that she has had left-sided rotator cuff tendinitis previously and her current pain does not feel like that.  She describes her pain as aching or burning or tingling and sometimes numb.  Pain extends from shoulder and anterior and posterior chest wall to forearm.  She does not have much pain extending to her hand.  Radiating pain: Yes, into R arm and into R chest Mechanical shoulder symptoms: Intermittent Neck pain: Yes, previously but not having any issues currently Aggravating factors: Repetitive use w/ keyboard and mouse Treatments tried: Has been worked up for carpal tunnel; steroid dose pack; [facility] Diagnostic testing: R shoulder XR on 01/16/19; Echocardiogram on 04/28/19  Past medical history, Surgical history, Family history, Social  history, Allergies, and medications have been entered into the medical record, reviewed.   Review of Systems: No new headache, visual changes, nausea, vomiting, diarrhea, constipation, dizziness, abdominal pain, skin rash, fevers, chills, night sweats, weight loss, swollen lymph nodes, body aches, joint swelling, muscle aches, chest pain, shortness of breath, mood changes, visual or auditory hallucinations.   Objective:    Vitals:   05/16/19 0827  BP: 122/78  Pulse: 77  SpO2: 99%   General: Well Developed, well nourished, and in no acute distress.  Neuro/Psych: Alert and oriented x3, extra-ocular muscles intact, able to move all 4 extremities, sensation grossly intact. Skin: Warm and dry, no rashes noted.  Respiratory: Not using accessory muscles, speaking in full sentences, trachea midline.  Cardiovascular: Pulses palpable, no extremity edema. Abdomen: Does not appear distended. MSK:  C-spine: Nontender to cervical midline.  Normal cervical motion mildly positive right-sided Spurling's test. Upper extremity strength reflexes and sensation are equal and normal throughout.  Right thoracic back: Normal-appearing mildly tender palpation medial inferior periscapular area.  Right shoulder: Normal-appearing Nontender. Normal shoulder motion to abduction external and internal rotation Intact strength abduction external and internal rotation Negative Hawkins and Neer's test.  Negative empty can test. Negative Yergason's and speeds test.  Right elbow: Normal-appearing nontender normal motion and strength.  No pain at lateral epicondyle with resisted wrist and finger extension.  Right wrist and hand: Normal-appearing with normal motion.  Normal grip strength sensation pulses and capillary refill.  Mildly positive Tinel's at carpal tunnel.  Contralateral left upper extremity: Normal appearance of shoulder elbow wrist and hand. Normal motion shoulder elbow wrist and hand. Normal strength  testing throughout upper extremity. Pulses cap refill and sensation are intact distally.   Lab and Radiology Results   MR [facility] 12/10/2018  [facility] Result Narrative  CLINICAL DATA: Numbness and paresthesias throughout the right leg for over 6 months.  EXAM: MRI CERVICAL SPINE WITHOUT CONTRAST  TECHNIQUE: Multiplanar, multisequence MR imaging of the cervical spine was performed. No intravenous contrast was administered.  COMPARISON: Plain film cervical spine 08/23/2016.  FINDINGS: Alignment: Maintained with straightening of lordosis noted.  Vertebrae: No fracture, evidence of discitis, or bone lesion. Congenitally narrow central canal noted.  Cord: Normal signal throughout.  Posterior Fossa, vertebral arteries, paraspinal tissues: Negative.  Disc levels:  C2-3: Central disc protrusion effaces the ventral thecal sac. The central canal and foramina remain open.  C3-4: Central disc protrusion mildly indents the ventral cord. Uncovertebral disease causes moderate to moderately severe left foraminal narrowing. Right foramen open.  C4-5: Shallow broad-based central protrusion results in mild flattening of the ventral cord. Foramina are open.  C5-6: Shallow disc osteophyte complex and bilateral uncovertebral disease. There is mild flattening of the cord. Moderately severe to severe bilateral foraminal narrowing.  C6-7: There is a shallow disc bulge and left worse than right uncovertebral disease. The ventral thecal sac is effaced. Moderate to moderately severe left and mild-to-moderate right foraminal narrowing.  C7-T1: Broad-based disc bulge, uncovertebral disease, facet degenerative change and ligamentum flavum thickening. Mild flattening of the ventral cord and moderately severe to severe bilateral foraminal narrowing.  IMPRESSION: Congenitally narrow central canal.  Multilevel degenerative disc disease results  in mild deformity of the cord at several levels, most notable at C3-4, C4-5 and C5-6. Multilevel foraminal narrowing is also present. Please see above for descriptions of individual levels.   Electronically Signed  By: Inge Rise M.D.  On: 12/10/2018 21:13    EXAM: MRI CERVICAL SPINE WITHOUT CONTRAST 09/05/16  TECHNIQUE: Multiplanar, multisequence MR imaging of the cervical spine was performed. No intravenous contrast was administered.  COMPARISON:  Cervical spine radiographs 08/23/2016  FINDINGS: Alignment: Cervical spine straightening.  No listhesis.  Vertebrae: No evidence of fracture, suspicious osseous lesion, or significant marrow edema.  Cord: Normal signal and morphology.  Posterior Fossa, vertebral arteries, paraspinal tissues: Unremarkable.  Disc levels:  The cervical spinal canal is mildly small in caliber diffusely on a congenital basis.  C2-3:  Minimal disc bulging without stenosis.  C3-4: Small central disc protrusion results in borderline to mild spinal stenosis. Left uncovertebral spurring results in mild left neural foraminal stenosis.  C4-5: Disc bulging, small central disc protrusion, and uncovertebral spurring result in mild spinal stenosis and minimal right neural foraminal narrowing.  C5-6: Broad-based posterior disc osteophyte complex results in mild spinal stenosis and mild right and moderate left neural foraminal stenosis with potential left C6 nerve root impingement.  C6-7: Disc bulging, small central disc protrusion, and left greater than right uncovertebral spurring result in borderline to mild spinal stenosis and moderate left neural foraminal stenosis with potential left C7 nerve root impingement.  C7-T1: Disc bulging and uncovertebral spurring result in mild spinal stenosis and mild-to-moderate bilateral neural foraminal stenosis.  IMPRESSION: 1. Diffuse cervical spondylosis with up to mild spinal  stenosis. 2. Moderate left neural foraminal stenosis at C5-6 and C6-7 with potential C6 and C7 nerve root impingement. 3. Mild-to-moderate bilateral foraminal stenosis at C7-T1.   Electronically  Signed   By: Logan Bores M.D.   On: 09/05/2016 09:01  EXAM: RIGHT ELBOW - 2 VIEW; RIGHT SHOULDER - 2+ VIEW  COMPARISON:  None.  FINDINGS: No fracture or dislocation of the right shoulder. Mild glenohumeral and acromioclavicular arthrosis. The partially imaged right chest is unremarkable.  No fracture or dislocation of the right elbow. There is mild humeroulnar joint arthrosis. No elbow joint effusion. Soft tissues are unremarkable.  IMPRESSION: 1. No fracture or dislocation of the right shoulder. Mild glenohumeral and acromioclavicular arthrosis.  2. No fracture or dislocation of the right elbow. There is mild humeroulnar joint arthrosis.   Electronically Signed   By: Eddie Candle M.D.   On: 01/17/2019 09:19  EXAM: RIGHT ELBOW - 2 VIEW; RIGHT SHOULDER - 2+ VIEW  COMPARISON:  None.  FINDINGS: No fracture or dislocation of the right shoulder. Mild glenohumeral and acromioclavicular arthrosis. The partially imaged right chest is unremarkable.  No fracture or dislocation of the right elbow. There is mild humeroulnar joint arthrosis. No elbow joint effusion. Soft tissues are unremarkable.  IMPRESSION: 1. No fracture or dislocation of the right shoulder. Mild glenohumeral and acromioclavicular arthrosis.  2. No fracture or dislocation of the right elbow. There is mild humeroulnar joint arthrosis.   Electronically Signed   By: Eddie Candle M.D.   On: 01/17/2019 09:19  I, Lynne Leader, personally (independently) visualized and performed the interpretation of the images attached in this note (excluding MRI from [place]).   Impression and Recommendations:    Assessment and Plan: 59 y.o. female with  Right arm shoulder and thoracic back and chest  pain.  Symptoms ongoing since March 2020. At this point the most likely explanation for her pain is cervical radiculopathy most likely at T1 nerve root.  She certainly has pathology at this level on MRI from [facility] August 2020.  Her physical exam also supports this as well.  She does not experience significant pain with shoulder and elbow motion nor with impingement testing or resisted strength testing.  That is atypical for rotator cuff pathology.  She does have a component of carpal tunnel syndrome however I do not think that explains her arm pain today.  After discussion of differential diagnosis treatment plan and options we will proceed with trial of oral prednisone.  Additionally will switch from gabapentin to Lyrica as gabapentin makes her a bit fatigued.  If not improving next step would be epidural steroid injection.  Patient will report back how she responds to the prednisone course.  Check back in 1 month.  PDMP reviewed during this encounter. No orders of the defined types were placed in this encounter.  Meds ordered this encounter  Medications  . predniSONE (DELTASONE) 50 MG tablet    Sig: Take 1 tablet (50 mg total) by mouth daily.    Dispense:  7 tablet    Refill:  0  . pregabalin (LYRICA) 75 MG capsule    Sig: Take 1 capsule (75 mg total) by mouth 2 (two) times daily.    Dispense:  60 capsule    Refill:  3    Discussed warning signs or symptoms. Please see discharge instructions. Patient expresses understanding.   The above documentation has been reviewed and is accurate and complete Lynne Leader

## 2019-05-19 ENCOUNTER — Ambulatory Visit: Payer: 59 | Admitting: Family Medicine

## 2019-05-19 ENCOUNTER — Encounter: Payer: Self-pay | Admitting: Family Medicine

## 2019-05-19 ENCOUNTER — Other Ambulatory Visit: Payer: Self-pay

## 2019-05-19 VITALS — BP 118/82 | HR 74 | Temp 96.4°F | Resp 18 | Wt 242.2 lb

## 2019-05-19 DIAGNOSIS — E039 Hypothyroidism, unspecified: Secondary | ICD-10-CM

## 2019-05-19 DIAGNOSIS — Z780 Asymptomatic menopausal state: Secondary | ICD-10-CM

## 2019-05-19 DIAGNOSIS — I10 Essential (primary) hypertension: Secondary | ICD-10-CM | POA: Diagnosis not present

## 2019-05-19 DIAGNOSIS — M5412 Radiculopathy, cervical region: Secondary | ICD-10-CM | POA: Diagnosis not present

## 2019-05-19 DIAGNOSIS — E559 Vitamin D deficiency, unspecified: Secondary | ICD-10-CM | POA: Diagnosis not present

## 2019-05-19 DIAGNOSIS — E2839 Other primary ovarian failure: Secondary | ICD-10-CM

## 2019-05-19 DIAGNOSIS — K219 Gastro-esophageal reflux disease without esophagitis: Secondary | ICD-10-CM

## 2019-05-19 DIAGNOSIS — Z1239 Encounter for other screening for malignant neoplasm of breast: Secondary | ICD-10-CM

## 2019-05-19 MED ORDER — BUSPIRONE HCL 5 MG PO TABS: 5.0000 mg | ORAL_TABLET | Freq: Three times a day (TID) | ORAL | 1 refills | Status: DC

## 2019-05-19 MED FILL — busPIRone HCL 5 MG TABS: 5 | 15 days supply | Qty: 45 | Fill #0

## 2019-05-19 NOTE — Assessment & Plan Note (Signed)
With pain radiating down the right arm has now seen sports medicine and is taking prednisone and switched from Gabapentin to Lyrica. Has a follow up in 1 month. No concerning side effects.

## 2019-05-19 NOTE — Patient Instructions (Signed)
Hip Pain The hip is the joint between the upper legs and the lower pelvis. The bones, cartilage, tendons, and muscles of your hip joint support your body and allow you to move around. Hip pain can range from a minor ache to severe pain in one or both of your hips. The pain may be felt on the inside of the hip joint near the groin, or on the outside near the buttocks and upper thigh. You may also have swelling or stiffness in your hip area. Follow these instructions at home: Managing pain, stiffness, and swelling      If directed, put ice on the painful area. To do this: ? Put ice in a plastic bag. ? Place a towel between your skin and the bag. ? Leave the ice on for 20 minutes, 2-3 times a day.  If directed, apply heat to the affected area as often as told by your health care provider. Use the heat source that your health care provider recommends, such as a moist heat pack or a heating pad. ? Place a towel between your skin and the heat source. ? Leave the heat on for 20-30 minutes. ? Remove the heat if your skin turns bright red. This is especially important if you are unable to feel pain, heat, or cold. You may have a greater risk of getting burned. Activity  Do exercises as told by your health care provider.  Avoid activities that cause pain. General instructions   Take over-the-counter and prescription medicines only as told by your health care provider.  Keep a journal of your symptoms. Write down: ? How often you have hip pain. ? The location of your pain. ? What the pain feels like. ? What makes the pain worse.  Sleep with a pillow between your legs on your most comfortable side.  Keep all follow-up visits as told by your health care provider. This is important. Contact a health care provider if:  You cannot put weight on your leg.  Your pain or swelling continues or gets worse after one week.  It gets harder to walk.  You have a fever. Get help right away  if:  You fall.  You have a sudden increase in pain and swelling in your hip.  Your hip is red or swollen or very tender to touch. Summary  Hip pain can range from a minor ache to severe pain in one or both of your hips.  The pain may be felt on the inside of the hip joint near the groin, or on the outside near the buttocks and upper thigh.  Avoid activities that cause pain.  Write down how often you have hip pain, the location of the pain, what makes it worse, and what it feels like. This information is not intended to replace advice given to you by your health care provider. Make sure you discuss any questions you have with your health care provider. Document Revised: 08/26/2018 Document Reviewed: 08/26/2018 Elsevier Patient Education  2020 [employer] -- 

## 2019-05-19 NOTE — Progress Notes (Signed)
Subjective:    Patient ID: [name], female    DOB: 27-Oct-1960, 59 y.o.   MRN: [id number]  No chief complaint on file.   HPI Patient is in today for chronic medical concerns. She feels well today. She had her first COVID shot and tolerated it well. Her greatest concern is her persistent anxiety. She is better with effexor but still anxious and jumpy. No suicidal ideation. She is working with sports med and they believe her neck is causing her right arm and she is tolerating the meds they put her on. Denies CP/palp/SOB/HA/congestion/fevers/GI or GU c/o. Taking meds as prescribed  Past Medical History:  Diagnosis Date  . Acute bronchitis 05/25/2016  . Anxiety   . Chronic pain syndrome 01/06/2013  . Chronic rhinosinusitis   . Colon polyps   . Cough 01/06/2013  . Depression   . Diabetes ([place]) 01/06/2013  . Diabetes mellitus type 2 in obese [facility]) 01/06/2013   Sees Dr Calvert Cantor for eye exam Does not see podiatry, foot exam today unremarkable except for thick cracking skin on heals   . Dysuria 05/25/2016  . Edema 01/06/2013  . Epistaxis 05/25/2016  . Fibroid, uterine   . GERD (gastroesophageal reflux disease)   . Glaucoma 12-13  . Hoarseness   . Hoarseness of voice 05/11/2013  . Hyperglycemia 04/13/2013  . Hyperlipemia, mixed 06/06/2007   Qualifier: Diagnosis of  By: Wynona Luna She feels Lipitor caused increased low back pain and weakness    . Hyperlipidemia   . Hypertension   . Hypokalemia 02/05/2013  . IBS (irritable bowel syndrome) 02/13/2011  . Low back pain 03/03/2013  . Muscle cramp 05/22/2014  . Obesity   . Obesity, unspecified 05/11/2013  . OSA (obstructive sleep apnea)   . Pain in joint, shoulder region 06/27/2015  . Perimenopause 10/05/2012  . Raynaud disease 06/16/2013  . Thyroid disease    hypothyroidism  . Vocal fold nodules     Past Surgical History:  Procedure Laterality Date  . ABDOMINAL HYSTERECTOMY    . BREAST EXCISIONAL BIOPSY Right    at age 42  .  CHOLECYSTECTOMY    . CYSTECTOMY    . DILATION AND CURETTAGE OF UTERUS     x2  . I & D EXTREMITY Left 04/11/2018   Procedure: DEBIDMENT DISTAL INTERPHALANGEAL LEFT MIDDLE;  Surgeon: Daryll Brod, MD;  Location: [place];  Service: Orthopedics;  Laterality: Left;  Marland Kitchen MASS EXCISION Left 04/11/2018   Procedure: EXCISION MASS;  Surgeon: Daryll Brod, MD;  Location: [place];  Service: Orthopedics;  Laterality: Left;  . OOPHORECTOMY    . POLYPECTOMY    . ROTATOR CUFF REPAIR      Family History  Problem Relation Age of Onset  . Alcohol abuse Father   . Cancer Father        renal and colon  . Hyperlipidemia Father   . Hypertension Father   . Stroke Father   . Heart disease Father   . Cancer Paternal Grandfather        colon  . Diabetes Other   . High blood pressure Mother   . High Cholesterol Mother   . Kidney disease Mother   . Depression Mother   . Anxiety disorder Mother   . Obesity Mother   . Breast cancer Other 45  . Diabetes Sister   . Diabetes Brother     Social History   Socioeconomic History  . Marital status: Significant Other  Spouse name: Not on file  . Number of children: 0  . Years of education: Not on file  . Highest education level: Not on file  Occupational History  . 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 Smoking status: Former Smoker    Packs/day: 0.50    Years: 30.00    Pack years: 15.00    Types: Cigarettes    Quit date: 04/25/2003    Years since quitting: 16.0  . Smokeless tobacco: Never Used  . Tobacco comment: smoked since age 39  Substance and Sexual Activity  . Alcohol use: No  . Drug use: Never  . Sexual activity: Yes    Partners: Male  Other Topics Concern  . Not on file  Social History Narrative   Endo-- Dr Dwyane Dee   ENT--Dr shoemaker   GI--Dr Buccini   Pulm--Dr Clance   Rheum--Dr Charlestine Night         Social Determinants of Health   Financial Resource Strain:   . Difficulty of Paying  Living Expenses: Not on file  Food Insecurity:   . Worried About Charity fundraiser in the Last Year: Not on file  . Ran Out of Food in the Last Year: Not on file  Transportation Needs:   . Lack of Transportation (Medical): Not on file  . Lack of Transportation (Non-Medical): Not on file  Physical Activity:   . Days of Exercise per Week: Not on file  . Minutes of Exercise per Session: Not on file  Stress:   . Feeling of Stress : Not on file  Social Connections:   . Frequency of Communication with Friends and Family: Not on file  . Frequency of Social Gatherings with Friends and Family: Not on file  . Attends Religious Services: Not on file  . Active Member of Clubs or Organizations: Not on file  . Attends Archivist Meetings: Not on file  . Marital Status: Not on file  Intimate Partner Violence:   . Fear of Current or Ex-Partner: Not on file  . Emotionally Abused: Not on file  . Physically Abused: Not on file  . Sexually Abused: Not on file    Outpatient Medications Prior to Visit  Medication Sig Dispense Refill  . acetaminophen (TYLENOL) 325 MG tablet Take 650 mg by mouth every 6 (six) hours as needed.    Marland Kitchen albuterol (VENTOLIN HFA) 108 (90 Base) MCG/ACT inhaler INHALE 2 PUFFS INTO THE LUNGS EVERY 4 HOURS AS NEEDED FOR WHEEZING OR SHORTNESS OF BREATH. (Patient not taking: Reported on 04/17/2019) 18 g 6  . aspirin EC 81 MG tablet Take 1 tablet (81 mg total) by mouth daily.    Marland Kitchen Cod Liver Oil 1000 MG CAPS Take 1 capsule by mouth daily.    . diphenhydrAMINE (BENADRYL) 25 MG tablet Take 25 mg by mouth every 6 (six) hours as needed.    . famotidine (PEPCID) 40 MG tablet Take 1 tablet (40 mg total) by mouth at bedtime. 30 tablet 2  . fenofibrate micronized (LOFIBRA) 134 MG capsule TAKE 1 CAPSULE BY MOUTH DAILY BEFORE BREAKFAST 90 capsule 2  . furosemide (LASIX) 20 MG tablet TAKE 1 TO 2 TABLETS BY MOUTH DAILY AS NEEDED FOR FLUID OR EDEMA 40 tablet 1  . hyoscyamine (LEVSIN SL)  0.125 MG SL tablet Place 1 tablet (0.125 mg total) under the tongue every 4 (four) hours as needed. 40 tablet 1  . linaclotide (LINZESS) 145 MCG CAPS capsule Take 1 capsule (145 mcg total)  by mouth daily before breakfast. 30 capsule 5  . loratadine (CLARITIN) 10 MG tablet Take 10 mg by mouth daily.    . metFORMIN (GLUCOPHAGE) 1000 MG tablet Take 1 tablet (1,000 mg total) by mouth daily with breakfast. 90 tablet 1  . metoprolol succinate (TOPROL-XL) 100 MG 24 hr tablet Take 1 tablet (100 mg total) by mouth daily. Take with or immediately following a meal. 30 tablet 1  . modafinil (PROVIGIL) 200 MG tablet Take 2 tablets (400 mg total) by mouth daily. 90 tablet 1  . predniSONE (DELTASONE) 50 MG tablet Take 1 tablet (50 mg total) by mouth daily. 7 tablet 0  . pregabalin (LYRICA) 75 MG capsule Take 1 capsule (75 mg total) by mouth 2 (two) times daily. 60 capsule 3  . spironolactone (ALDACTONE) 25 MG tablet Take 1-2 tablets (25-50 mg total) by mouth 3 (three) times daily. As directed 270 tablet 1  . SYNTHROID 100 MCG tablet TAKE 1 TABLET (100 MCG TOTAL) BY MOUTH DAILY BEFORE BREAKFAST. 90 tablet 1  . Venlafaxine HCl 150 MG TB24 TAKE 1 TABLET (150 MG TOTAL) BY MOUTH AT BEDTIME. 30 tablet 1  . Vitamin D, Ergocalciferol, (DRISDOL) 1.25 MG (50000 UT) CAPS capsule Take 1 capsule (50,000 Units total) by mouth every 7 (seven) days. 4 capsule 4   No facility-administered medications prior to visit.    Allergies  Allergen Reactions  . Losartan Cough  . Wellbutrin [Bupropion] Other (See Comments)    Dizziness and mental status changes  . Atorvastatin     myalgias  . Codeine   . Amoxicillin Rash  . Aspirin Nausea Only    GI upset REACTION: Upset stomach  . Erythromycin Rash  . Penicillins Rash    Review of Systems  Constitutional: Positive for malaise/fatigue. Negative for fever.  HENT: Negative for congestion.   Eyes: Negative for blurred vision.  Respiratory: Negative for shortness of breath.     Cardiovascular: Negative for chest pain, palpitations and leg swelling.  Gastrointestinal: Negative for abdominal pain, blood in stool and nausea.  Genitourinary: Negative for dysuria and frequency.  Musculoskeletal: Positive for joint pain. Negative for falls.  Skin: Negative for rash.  Neurological: Negative for dizziness, loss of consciousness and headaches.  Endo/Heme/Allergies: Negative for environmental allergies.  Psychiatric/Behavioral: Negative for depression. The patient is nervous/anxious.        Objective:    Physical Exam Vitals and nursing note reviewed.  Constitutional:      General: She is not in acute distress.    Appearance: She is well-developed.  HENT:     Head: Normocephalic and atraumatic.     Nose: Nose normal.  Eyes:     General:        Right eye: No discharge.        Left eye: No discharge.  Cardiovascular:     Rate and Rhythm: Normal rate and regular rhythm.     Heart sounds: No murmur.  Pulmonary:     Effort: Pulmonary effort is normal.     Breath sounds: Normal breath sounds.  Abdominal:     General: Bowel sounds are normal.     Palpations: Abdomen is soft.     Tenderness: There is no abdominal tenderness.  Musculoskeletal:     Cervical back: Normal range of motion and neck supple.  Skin:    General: Skin is warm and dry.  Neurological:     Mental Status: She is alert and oriented to person, place, and time.  BP 118/82 (BP Location: Left Arm, Patient Position: Sitting, Cuff Size: Normal)   Pulse 74   Temp (!) 96.4 F (35.8 C) (Temporal)   Resp 18   Wt 242 lb 3.2 oz (109.9 kg)   SpO2 98%   BMI 40.93 kg/m  Wt Readings from Last 3 Encounters:  05/19/19 242 lb 3.2 oz (109.9 kg)  05/16/19 242 lb 3.2 oz (109.9 kg)  04/30/19 240 lb (108.9 kg)    Diabetic Foot Exam - Simple   No data filed     Lab Results  Component Value Date   WBC 5.3 04/15/2019   HGB 13.7 04/15/2019   HCT 41.9 04/15/2019   PLT 401.0 (H) 04/15/2019    GLUCOSE 89 04/15/2019   CHOL 226 (H) 04/15/2019   TRIG 107.0 04/15/2019   HDL 49.90 04/15/2019   LDLDIRECT 103.0 07/11/2016   LDLCALC 154 (H) 04/15/2019   ALT 19 04/15/2019   AST 22 04/15/2019   NA 139 04/15/2019   K 3.9 04/15/2019   CL 103 04/15/2019   CREATININE 0.98 04/15/2019   BUN 14 04/15/2019   CO2 24 04/15/2019   TSH 0.96 04/15/2019   HGBA1C 6.0 04/15/2019   MICROALBUR 0.7 10/11/2016    Lab Results  Component Value Date   TSH 0.96 04/15/2019   Lab Results  Component Value Date   WBC 5.3 04/15/2019   HGB 13.7 04/15/2019   HCT 41.9 04/15/2019   MCV 90.7 04/15/2019   PLT 401.0 (H) 04/15/2019   Lab Results  Component Value Date   NA 139 04/15/2019   K 3.9 04/15/2019   CO2 24 04/15/2019   GLUCOSE 89 04/15/2019   BUN 14 04/15/2019   CREATININE 0.98 04/15/2019   BILITOT 0.4 04/15/2019   ALKPHOS 50 04/15/2019   AST 22 04/15/2019   ALT 19 04/15/2019   PROT 7.3 04/15/2019   ALBUMIN 4.5 04/15/2019   CALCIUM 9.9 04/15/2019   ANIONGAP 10 04/04/2018   GFR 70.44 04/15/2019   Lab Results  Component Value Date   CHOL 226 (H) 04/15/2019   Lab Results  Component Value Date   HDL 49.90 04/15/2019   Lab Results  Component Value Date   LDLCALC 154 (H) 04/15/2019   Lab Results  Component Value Date   TRIG 107.0 04/15/2019   Lab Results  Component Value Date   CHOLHDL 5 04/15/2019   Lab Results  Component Value Date   HGBA1C 6.0 04/15/2019       Assessment & Plan:   Problem List Items Addressed This Visit    Hypothyroidism    On Levothyroxine, continue to monitor      Essential hypertension    Well controlled, no changes to meds. Encouraged heart healthy diet such as the DASH diet and exercise as tolerated.       GERD    Avoid offending foods, start probiotics. Do not eat large meals in late evening and consider raising head of bed. Continue current meds      Vitamin D deficiency    Supplement and monitor      Cervical radiculopathy     With pain radiating down the right arm has now seen sports medicine and is taking prednisone and switched from Gabapentin to Lyrica. Has a follow up in 1 month. No concerning side effects.          I am having [name] maintain her albuterol, Cod Liver Oil, loratadine, acetaminophen, diphenhydrAMINE, fenofibrate micronized, metFORMIN, linaclotide, hyoscyamine, modafinil, Vitamin D (Ergocalciferol),  metoprolol succinate, aspirin EC, famotidine, furosemide, spironolactone, Synthroid, Venlafaxine HCl, predniSONE, and pregabalin.  No orders of the defined types were placed in this encounter.    Penni Homans, MD

## 2019-05-19 NOTE — Assessment & Plan Note (Signed)
Avoid offending foods, start probiotics. Do not eat large meals in late evening and consider raising head of bed. Continue current meds 

## 2019-05-19 NOTE — Assessment & Plan Note (Signed)
On Levothyroxine, continue to monitor 

## 2019-05-19 NOTE — Assessment & Plan Note (Signed)
Supplement and monitor 

## 2019-05-19 NOTE — Assessment & Plan Note (Signed)
Well controlled, no changes to meds. Encouraged heart healthy diet such as the DASH diet and exercise as tolerated.  °

## 2019-05-26 ENCOUNTER — Other Ambulatory Visit: Payer: Self-pay | Admitting: Family Medicine

## 2019-05-26 MED FILL — METOPROLOL SUCCINATE ER 100: 100 | 30 days supply | Qty: 30 | Fill #0

## 2019-05-27 ENCOUNTER — Other Ambulatory Visit: Payer: Self-pay | Admitting: Family Medicine

## 2019-05-27 MED FILL — LINZESS 145 MCG CAPSULE: 145 | 90 days supply | Qty: 90 | Fill #0

## 2019-06-02 ENCOUNTER — Encounter: Payer: Self-pay | Admitting: Family Medicine

## 2019-06-02 ENCOUNTER — Other Ambulatory Visit: Payer: Self-pay | Admitting: Family Medicine

## 2019-06-02 MED FILL — busPIRone HCL 5 MG TABS: 5 | 15 days supply | Qty: 45 | Fill #1

## 2019-06-02 MED FILL — FUROSEMIDE 20 MG TABS: 20 | 20 days supply | Qty: 40 | Fill #0

## 2019-06-03 ENCOUNTER — Other Ambulatory Visit: Payer: Self-pay | Admitting: Family Medicine

## 2019-06-03 DIAGNOSIS — M25551 Pain in right hip: Secondary | ICD-10-CM

## 2019-06-03 MED ORDER — SPIRONOLACTONE 50 MG PO TABS: 50.0000 mg | ORAL_TABLET | Freq: Two times a day (BID) | ORAL | 3 refills | Status: DC

## 2019-06-03 MED FILL — SPIRONOLACTONE 50 MG TABLET: 50 | 30 days supply | Qty: 60 | Fill #0

## 2019-06-05 ENCOUNTER — Ambulatory Visit (HOSPITAL_BASED_OUTPATIENT_CLINIC_OR_DEPARTMENT_OTHER)
Admission: RE | Admit: 2019-06-05 | Discharge: 2019-06-05 | Disposition: A | Discharge status: Home / Self Care | Payer: 59 | Source: Ambulatory Visit | Attending: Family Medicine | Admitting: Family Medicine

## 2019-06-05 ENCOUNTER — Encounter: Payer: Self-pay | Admitting: Family Medicine

## 2019-06-05 ENCOUNTER — Other Ambulatory Visit: Payer: Self-pay

## 2019-06-05 DIAGNOSIS — M1611 Unilateral primary osteoarthritis, right hip: Secondary | ICD-10-CM | POA: Diagnosis not present

## 2019-06-05 DIAGNOSIS — M25551 Pain in right hip: Secondary | ICD-10-CM | POA: Diagnosis not present

## 2019-06-09 ENCOUNTER — Other Ambulatory Visit: Payer: Self-pay | Admitting: Family Medicine

## 2019-06-09 ENCOUNTER — Encounter (INDEPENDENT_AMBULATORY_CARE_PROVIDER_SITE_OTHER): Payer: Self-pay

## 2019-06-09 DIAGNOSIS — F4323 Adjustment disorder with mixed anxiety and depressed mood: Secondary | ICD-10-CM

## 2019-06-09 DIAGNOSIS — R7303 Prediabetes: Secondary | ICD-10-CM

## 2019-06-09 MED FILL — VENLAFAXINE HCL ER 150 MG T: 150 | 30 days supply | Qty: 30 | Fill #1

## 2019-06-10 MED FILL — metFORMIN HCL 1000 MG TABS: 1000 | 90 days supply | Qty: 90 | Fill #0

## 2019-06-10 MED FILL — PREGABALIN 75 MG CAPS: 75 | 30 days supply | Qty: 60 | Fill #1

## 2019-06-13 ENCOUNTER — Ambulatory Visit: Payer: 59 | Admitting: Cardiology

## 2019-06-16 ENCOUNTER — Encounter: Payer: Self-pay | Admitting: Family Medicine

## 2019-06-17 ENCOUNTER — Other Ambulatory Visit: Payer: Self-pay | Admitting: Family Medicine

## 2019-06-17 DIAGNOSIS — L918 Other hypertrophic disorders of the skin: Secondary | ICD-10-CM | POA: Diagnosis not present

## 2019-06-17 DIAGNOSIS — L659 Nonscarring hair loss, unspecified: Secondary | ICD-10-CM | POA: Diagnosis not present

## 2019-06-17 DIAGNOSIS — L83 Acanthosis nigricans: Secondary | ICD-10-CM | POA: Diagnosis not present

## 2019-06-17 DIAGNOSIS — L819 Disorder of pigmentation, unspecified: Secondary | ICD-10-CM | POA: Diagnosis not present

## 2019-06-17 MED ORDER — SPIRONOLACTONE 50 MG PO TABS: 50.0000 mg | ORAL_TABLET | Freq: Three times a day (TID) | ORAL | 3 refills | Status: DC

## 2019-06-17 MED FILL — BETAMETHASONE DP 0.05% OINT: 0.05 | 30 days supply | Qty: 45 | Fill #0

## 2019-06-19 MED FILL — SPIRONOLACTONE 50 MG TABLET: 50 | 30 days supply | Qty: 90 | Fill #0

## 2019-06-20 ENCOUNTER — Other Ambulatory Visit: Payer: Self-pay

## 2019-06-20 ENCOUNTER — Ambulatory Visit: Payer: 59 | Admitting: Family Medicine

## 2019-06-20 ENCOUNTER — Encounter: Payer: Self-pay | Admitting: Family Medicine

## 2019-06-20 ENCOUNTER — Ambulatory Visit (INDEPENDENT_AMBULATORY_CARE_PROVIDER_SITE_OTHER): Payer: 59 | Admitting: Family Medicine

## 2019-06-20 ENCOUNTER — Ambulatory Visit
Admission: RE | Admit: 2019-06-20 | Discharge: 2019-06-20 | Disposition: A | Discharge status: Home / Self Care | Payer: Self-pay | Source: Ambulatory Visit | Attending: Family Medicine | Admitting: Family Medicine

## 2019-06-20 VITALS — BP 130/78 | HR 83 | Ht 64.5 in | Wt 244.0 lb

## 2019-06-20 DIAGNOSIS — F4323 Adjustment disorder with mixed anxiety and depressed mood: Secondary | ICD-10-CM

## 2019-06-20 DIAGNOSIS — M79601 Pain in right arm: Secondary | ICD-10-CM | POA: Diagnosis not present

## 2019-06-20 DIAGNOSIS — M5010 Cervical disc disorder with radiculopathy, unspecified cervical region: Secondary | ICD-10-CM | POA: Diagnosis not present

## 2019-06-20 DIAGNOSIS — E1169 Type 2 diabetes mellitus with other specified complication: Secondary | ICD-10-CM

## 2019-06-20 DIAGNOSIS — R7303 Prediabetes: Secondary | ICD-10-CM | POA: Diagnosis not present

## 2019-06-20 DIAGNOSIS — I10 Essential (primary) hypertension: Secondary | ICD-10-CM | POA: Diagnosis not present

## 2019-06-20 DIAGNOSIS — E6609 Other obesity due to excess calories: Secondary | ICD-10-CM

## 2019-06-20 DIAGNOSIS — E669 Obesity, unspecified: Secondary | ICD-10-CM | POA: Diagnosis not present

## 2019-06-20 DIAGNOSIS — E039 Hypothyroidism, unspecified: Secondary | ICD-10-CM | POA: Diagnosis not present

## 2019-06-20 DIAGNOSIS — E559 Vitamin D deficiency, unspecified: Secondary | ICD-10-CM

## 2019-06-20 MED ORDER — BUSPIRONE HCL 15 MG PO TABS: 15.0000 mg | ORAL_TABLET | Freq: Two times a day (BID) | ORAL | 1 refills | Status: DC

## 2019-06-20 MED ORDER — METFORMIN HCL 1000 MG PO TABS: 500.0000 mg | ORAL_TABLET | Freq: Every day | ORAL | 0 refills | Status: DC

## 2019-06-20 MED ORDER — PREGABALIN 150 MG PO CAPS: 150.0000 mg | ORAL_CAPSULE | Freq: Two times a day (BID) | ORAL | 3 refills | Status: DC

## 2019-06-20 MED FILL — busPIRone HCL 15 MG TABS: 15 | 30 days supply | Qty: 60 | Fill #0

## 2019-06-20 MED FILL — METOPROLOL SUCCINATE ER 100: 100 | 30 days supply | Qty: 30 | Fill #1

## 2019-06-20 MED FILL — FUROSEMIDE 20 MG TABS: 20 | 20 days supply | Qty: 40 | Fill #1

## 2019-06-20 NOTE — Progress Notes (Signed)
Follow up on Buspar, wants to see if she can try the Wellbutrin  Wants an appetite suppressant medication  Been having low blood sugars as low as 43 This am it was 106

## 2019-06-20 NOTE — Assessment & Plan Note (Signed)
hgba1c acceptable, minimize simple carbs. Increase exercise as tolerated. Her sugars have dropped as low as 48 so we will drop her Metformin from 1000 to 500 mg qd and reevaluate. Reminded to eat every 4-5 hours with minimal carbs and increased protein

## 2019-06-20 NOTE — Assessment & Plan Note (Addendum)
Monitor  no changes to meds. Encouraged heart healthy diet such as the DASH diet and exercise as tolerated. Most

## 2019-06-20 NOTE — Progress Notes (Signed)
I, Wendy Poet, LAT, ATC, am serving as scribe for Dr. Lynne Leader.  [name] is a 59 y.o. female who presents to [place] at [facility] today for f/u of R shoulder and upper arm pain.  She was last seen by Dr. Georgina Snell on 05/16/19 and was prescribed Lyrica and a trial of prednisone 50mg .  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  Since her last visit, pt reports having con't pain in her R shoulder, scapula and radiating pain into the R lateral upper arm, forearm and R pinky.  She is also reporting spasms/twitching in her R thumb.  She notes numbness in her R arm in the morning when she first wakes up but then transitions to pain.  She states that the prednisone helped a little w/ her symptoms.  She cont to take the Lyrica.  Patient also notes that she is a poor ergonomic work set up.  She cannot adjust the monitors and finds that she often has to either have her arms up too high or her head canted in an odd positioning.  Diagnostic testing: R shoulder and R elbow XR- 01/16/19; c-spine XR- 08/23/16; c-spine MRI- 09/05/16   Pertinent review of systems: No fevers or chills.  Relevant historical information: Hypertension, diabetes, thyroid disease.  Patient works as a Marine scientist in a telemetry unit.   Exam:  BP 130/78 (BP Location: Left Arm, Patient Position: Sitting, Cuff Size: Large)   Pulse 83   Ht 5' 4.5" (1.638 m)   Wt 244 lb (110.7 kg)   SpO2 100%   BMI 41.24 kg/m  General: Well Developed, well nourished, and in no acute distress.   MSK: C-spine: Nontender normal cervical motion.  Positive Spurling's test right side.  Normal strength.    Lab and Radiology Results MR SPINE CERVICAL WO CONTRAST8/18/2020 [facility] Result Narrative  CLINICAL DATA: Numbness and paresthesias throughout the right leg for over 6 months.  EXAM: MRI CERVICAL SPINE WITHOUT  CONTRAST  TECHNIQUE: Multiplanar, multisequence MR imaging of the cervical spine was performed. No intravenous contrast was administered.  COMPARISON: Plain film cervical spine 08/23/2016.  FINDINGS: Alignment: Maintained with straightening of lordosis noted.  Vertebrae: No fracture, evidence of discitis, or bone lesion. Congenitally narrow central canal noted.  Cord: Normal signal throughout.  Posterior Fossa, vertebral arteries, paraspinal tissues: Negative.  Disc levels:  C2-3: Central disc protrusion effaces the ventral thecal sac. The central canal and foramina remain open.  C3-4: Central disc protrusion mildly indents the ventral cord. Uncovertebral disease causes moderate to moderately severe left foraminal narrowing. Right foramen open.  C4-5: Shallow broad-based central protrusion results in mild flattening of the ventral cord. Foramina are open.  C5-6: Shallow disc osteophyte complex and bilateral uncovertebral disease. There is mild flattening of the cord. Moderately severe to severe bilateral foraminal narrowing.  C6-7: There is a shallow disc bulge and left worse than right uncovertebral disease. The ventral thecal sac is effaced. Moderate to moderately severe left and mild-to-moderate right foraminal narrowing.  C7-T1: Broad-based disc bulge, uncovertebral disease, facet degenerative change and ligamentum flavum thickening. Mild flattening of the ventral cord and moderately severe to severe bilateral foraminal narrowing.  IMPRESSION: Congenitally narrow central canal.  Multilevel degenerative disc disease results in mild deformity of the cord at several levels, most notable at C3-4, C4-5 and C5-6. Multilevel foraminal narrowing is also present. Please see above for descriptions of individual levels.  Electronically Signed  By: Inge Rise M.D.  On: 12/10/2018 21:13    I, Lynne Leader, personally (independently) visualized and performed  the interpretation of the images attached in this note.  Images reviewed on CD provided by patient.     Assessment and Plan: 59 y.o. female with right cervical radiculopathy C8 dermatome based on pain radiating to pinky.  Patient had some benefit with conservative management including prednisone and Lyrica but not enough.  Plan to proceed with epidural steroid injection.  Increase Lyrica.  Additionally wrote letter to allow patient to adjust her work monitors to be more ergonomic.   PDMP not reviewed this encounter. Orders Placed This Encounter  Procedures  . DG Epidural/Nerve Root    Standing Status:   Future    Standing Expiration Date:   08/17/2020    Order Specific Question:   Reason for Exam (SYMPTOM  OR DIAGNOSIS REQUIRED)    Answer:   left c8 nerve    Order Specific Question:   Is the patient pregnant?    Answer:   No    Order Specific Question:   Preferred imaging location?    Answer:   [facility]  . MR OUTSIDE FILMS SPINE    Standing Status:   Future    Number of Occurrences:   1    Standing Expiration Date:   08/17/2020    Order Specific Question:   Where was this CD imported?    Answer:   [facility]   Meds ordered this encounter  Medications  . pregabalin (LYRICA) 150 MG capsule    Sig: Take 1 capsule (150 mg total) by mouth 2 (two) times daily.    Dispense:  60 capsule    Refill:  3     Discussed warning signs or symptoms. Please see discharge instructions. Patient expresses understanding.   The above documentation has been reviewed and is accurate and complete Lynne Leader  Total encounter time 30 minutes including charting time date of service.

## 2019-06-20 NOTE — Patient Instructions (Addendum)
Thank you for coming in today.  You should hear soon about cervical epidural steroid injection.  Bring the CD of the cervical MRI with you for the injection.  I will also place an order for them to load it into their system.  Follow up as needed for this issue or others.

## 2019-06-22 NOTE — Assessment & Plan Note (Signed)
Supplement and monitor 

## 2019-06-22 NOTE — Progress Notes (Signed)
Patient ID: [name], female   DOB: 1961/02/12, 59 y.o.   MRN: [id number] Virtual phone via Video Note  I connected with [name] on 06/20/19 at  8:20 AM EST by a phone enabled telemedicine application and verified that I am speaking with the correct person using two identifiers.  Location: Patient: home Provider: home   I discussed the limitations of evaluation and management by telemedicine and the availability of in person appointments. The patient expressed understanding and agreed to proceed. Magdalene Molly, CMA was able to get the patient set up on a phone visit, after being unable to set up a video visit.   Subjective:    Patient ID: [name], female    DOB: 02/18/1961, 59 y.o.   MRN: [id number]  Chief Complaint  Patient presents with  . Medication Refill    HPI Patient is in today for follow up on chronic medical concerns. Her anxiety has continued to be difficult to manage. She notes She took Buspar 10 mg in am a few times and that seemed to help take the edge off. She notes some anhedonia and fatigue but but does not endorse suicidal ideation. She has an appointment with [facility] soon. She has had some episodes of low surgars into the 40s which made her feel weak and shakey. Her blood pressure has been somewhat labile as well. Generally she is in 120s and 130s but can spike to 153. Denies CP/palp/SOB/HA/congestion/fevers/GI or GU c/o. Taking meds as prescribed  Past Medical History:  Diagnosis Date  . Acute bronchitis 05/25/2016  . Anxiety   . Chronic pain syndrome 01/06/2013  . Chronic rhinosinusitis   . Colon polyps   . Cough 01/06/2013  . Depression   . Diabetes ([place]) 01/06/2013  . Diabetes mellitus type 2 in obese [facility]) 01/06/2013   Sees Dr Calvert Cantor for eye exam Does not see podiatry, foot exam today unremarkable except for thick cracking skin on heals   . Dysuria 05/25/2016  . Edema 01/06/2013  . Epistaxis 05/25/2016  . Fibroid, uterine     . GERD (gastroesophageal reflux disease)   . Glaucoma 12-13  . Hoarseness   . Hoarseness of voice 05/11/2013  . Hyperglycemia 04/13/2013  . Hyperlipemia, mixed 06/06/2007   Qualifier: Diagnosis of  By: Wynona Luna She feels Lipitor caused increased low back pain and weakness    . Hyperlipidemia   . Hypertension   . Hypokalemia 02/05/2013  . IBS (irritable bowel syndrome) 02/13/2011  . Low back pain 03/03/2013  . Muscle cramp 05/22/2014  . Obesity   . Obesity, unspecified 05/11/2013  . OSA (obstructive sleep apnea)   . Pain in joint, shoulder region 06/27/2015  . Perimenopause 10/05/2012  . Raynaud disease 06/16/2013  . Thyroid disease    hypothyroidism  . Vocal fold nodules     Past Surgical History:  Procedure Laterality Date  . ABDOMINAL HYSTERECTOMY    . BREAST EXCISIONAL BIOPSY Right    at age 73  . CHOLECYSTECTOMY    . CYSTECTOMY    . DILATION AND CURETTAGE OF UTERUS     x2  . I & D EXTREMITY Left 04/11/2018   Procedure: DEBIDMENT DISTAL INTERPHALANGEAL LEFT MIDDLE;  Surgeon: Daryll Brod, MD;  Location: [place];  Service: Orthopedics;  Laterality: Left;  Marland Kitchen MASS EXCISION Left 04/11/2018   Procedure: EXCISION MASS;  Surgeon: Daryll Brod, MD;  Location: [place];  Service: Orthopedics;  Laterality: Left;  .  OOPHORECTOMY    . POLYPECTOMY    . ROTATOR CUFF REPAIR      Family History  Problem Relation Age of Onset  . Alcohol abuse Father   . Cancer Father        renal and colon  . Hyperlipidemia Father   . Hypertension Father   . Stroke Father   . Heart disease Father   . Cancer Paternal Grandfather        colon  . Diabetes Other   . High blood pressure Mother   . High Cholesterol Mother   . Kidney disease Mother   . Depression Mother   . Anxiety disorder Mother   . Obesity Mother   . Breast cancer Other 45  . Diabetes Sister   . Diabetes Brother     Social History   Socioeconomic History  . Marital status:  Significant Other    Spouse name: Not on file  . Number of children: 0  . Years of education: Not on file  . Highest education level: Not on file  Occupational History  . 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 Smoking status: Former Smoker    Packs/day: 0.50    Years: 30.00    Pack years: 15.00    Types: Cigarettes    Quit date: 04/25/2003    Years since quitting: 16.1  . Smokeless tobacco: Never Used  . Tobacco comment: smoked since age 89  Substance and Sexual Activity  . Alcohol use: No  . Drug use: Never  . Sexual activity: Yes    Partners: Male  Other Topics Concern  . Not on file  Social History Narrative   Endo-- Dr Dwyane Dee   ENT--Dr shoemaker   GI--Dr Buccini   Pulm--Dr Clance   Rheum--Dr Charlestine Night         Social Determinants of Health   Financial Resource Strain:   . Difficulty of Paying Living Expenses: Not on file  Food Insecurity:   . Worried About Charity fundraiser in the Last Year: Not on file  . Ran Out of Food in the Last Year: Not on file  Transportation Needs:   . Lack of Transportation (Medical): Not on file  . Lack of Transportation (Non-Medical): Not on file  Physical Activity:   . Days of Exercise per Week: Not on file  . Minutes of Exercise per Session: Not on file  Stress:   . Feeling of Stress : Not on file  Social Connections:   . Frequency of Communication with Friends and Family: Not on file  . Frequency of Social Gatherings with Friends and Family: Not on file  . Attends Religious Services: Not on file  . Active Member of Clubs or Organizations: Not on file  . Attends Archivist Meetings: Not on file  . Marital Status: Not on file  Intimate Partner Violence:   . Fear of Current or Ex-Partner: Not on file  . Emotionally Abused: Not on file  . Physically Abused: Not on file  . Sexually Abused: Not on file    Outpatient Medications Prior to Visit  Medication Sig Dispense Refill  . acetaminophen (TYLENOL) 325  MG tablet Take 650 mg by mouth every 6 (six) hours as needed.    Marland Kitchen aspirin EC 81 MG tablet Take 1 tablet (81 mg total) by mouth daily.    Marland Kitchen Cod Liver Oil 1000 MG CAPS Take 1 capsule by mouth daily.    . diphenhydrAMINE (BENADRYL) 25 MG  tablet Take 25 mg by mouth every 6 (six) hours as needed.    . famotidine (PEPCID) 40 MG tablet Take 1 tablet (40 mg total) by mouth at bedtime. 30 tablet 2  . fenofibrate micronized (LOFIBRA) 134 MG capsule TAKE 1 CAPSULE BY MOUTH DAILY BEFORE BREAKFAST 90 capsule 2  . furosemide (LASIX) 20 MG tablet TAKE 1 TO 2 TABLETS BY MOUTH DAILY AS NEEDED FOR FLUID OR EDEMA 40 tablet 1  . hyoscyamine (LEVSIN SL) 0.125 MG SL tablet Place 1 tablet (0.125 mg total) under the tongue every 4 (four) hours as needed. 40 tablet 1  . LINZESS 145 MCG CAPS capsule TAKE 1 CAPSULE (145 MCG TOTAL) BY MOUTH DAILY BEFORE BREAKFAST. 90 capsule 5  . loratadine (CLARITIN) 10 MG tablet Take 10 mg by mouth daily.    . metoprolol succinate (TOPROL-XL) 100 MG 24 hr tablet TAKE 1 TABLET (100 MG TOTAL) BY MOUTH DAILY. TAKE WITH OR IMMEDIATELY FOLLOWING A MEAL. 30 tablet 1  . modafinil (PROVIGIL) 200 MG tablet Take 2 tablets (400 mg total) by mouth daily. 90 tablet 1  . spironolactone (ALDACTONE) 50 MG tablet Take 1 tablet (50 mg total) by mouth 3 (three) times daily. 90 tablet 3  . SYNTHROID 100 MCG tablet TAKE 1 TABLET (100 MCG TOTAL) BY MOUTH DAILY BEFORE BREAKFAST. 90 tablet 1  . Venlafaxine HCl 150 MG TB24 TAKE 1 TABLET (150 MG TOTAL) BY MOUTH AT BEDTIME. 30 tablet 1  . Vitamin D, Ergocalciferol, (DRISDOL) 1.25 MG (50000 UT) CAPS capsule Take 1 capsule (50,000 Units total) by mouth every 7 (seven) days. 4 capsule 4  . busPIRone (BUSPAR) 5 MG tablet Take 1 tablet (5 mg total) by mouth 3 (three) times daily. 45 tablet 1  . metFORMIN (GLUCOPHAGE) 1000 MG tablet TAKE 1 TABLET (1,000 MG TOTAL) BY MOUTH DAILY WITH BREAKFAST. 90 tablet 0  . predniSONE (DELTASONE) 50 MG tablet Take 1 tablet (50 mg total)  by mouth daily. 7 tablet 0  . pregabalin (LYRICA) 75 MG capsule Take 1 capsule (75 mg total) by mouth 2 (two) times daily. 60 capsule 3  . albuterol (VENTOLIN HFA) 108 (90 Base) MCG/ACT inhaler INHALE 2 PUFFS INTO THE LUNGS EVERY 4 HOURS AS NEEDED FOR WHEEZING OR SHORTNESS OF BREATH. (Patient not taking: Reported on 04/17/2019) 18 g 6   No facility-administered medications prior to visit.    Allergies  Allergen Reactions  . Losartan Cough  . Wellbutrin [Bupropion] Other (See Comments)    Dizziness and mental status changes  . Atorvastatin     myalgias  . Codeine   . Amoxicillin Rash  . Aspirin Nausea Only    GI upset REACTION: Upset stomach  . Erythromycin Rash  . Penicillins Rash    Review of Systems  Constitutional: Positive for malaise/fatigue. Negative for fever.  HENT: Negative for congestion.   Eyes: Negative for blurred vision.  Respiratory: Negative for shortness of breath.   Cardiovascular: Negative for chest pain, palpitations and leg swelling.  Gastrointestinal: Negative for abdominal pain, blood in stool and nausea.  Genitourinary: Negative for dysuria and frequency.  Musculoskeletal: Negative for falls.  Skin: Negative for rash.  Neurological: Negative for dizziness, loss of consciousness and headaches.  Endo/Heme/Allergies: Negative for environmental allergies.  Psychiatric/Behavioral: Negative for depression. The patient is nervous/anxious.        Objective:    Physical Exam  BP (!) 153/89 (BP Location: Left Arm, Patient Position: Sitting, Cuff Size: Normal)   Wt 243 lb (110.2 kg)  BMI 41.07 kg/m  Wt Readings from Last 3 Encounters:  06/20/19 244 lb (110.7 kg)  06/20/19 243 lb (110.2 kg)  05/19/19 242 lb 3.2 oz (109.9 kg)    Diabetic Foot Exam - Simple   No data filed     Lab Results  Component Value Date   WBC 5.3 04/15/2019   HGB 13.7 04/15/2019   HCT 41.9 04/15/2019   PLT 401.0 (H) 04/15/2019   GLUCOSE 89 04/15/2019   CHOL 226 (H)  04/15/2019   TRIG 107.0 04/15/2019   HDL 49.90 04/15/2019   LDLDIRECT 103.0 07/11/2016   LDLCALC 154 (H) 04/15/2019   ALT 19 04/15/2019   AST 22 04/15/2019   NA 139 04/15/2019   K 3.9 04/15/2019   CL 103 04/15/2019   CREATININE 0.98 04/15/2019   BUN 14 04/15/2019   CO2 24 04/15/2019   TSH 0.96 04/15/2019   HGBA1C 6.0 04/15/2019   MICROALBUR 0.7 10/11/2016    Lab Results  Component Value Date   TSH 0.96 04/15/2019   Lab Results  Component Value Date   WBC 5.3 04/15/2019   HGB 13.7 04/15/2019   HCT 41.9 04/15/2019   MCV 90.7 04/15/2019   PLT 401.0 (H) 04/15/2019   Lab Results  Component Value Date   NA 139 04/15/2019   K 3.9 04/15/2019   CO2 24 04/15/2019   GLUCOSE 89 04/15/2019   BUN 14 04/15/2019   CREATININE 0.98 04/15/2019   BILITOT 0.4 04/15/2019   ALKPHOS 50 04/15/2019   AST 22 04/15/2019   ALT 19 04/15/2019   PROT 7.3 04/15/2019   ALBUMIN 4.5 04/15/2019   CALCIUM 9.9 04/15/2019   ANIONGAP 10 04/04/2018   GFR 70.44 04/15/2019   Lab Results  Component Value Date   CHOL 226 (H) 04/15/2019   Lab Results  Component Value Date   HDL 49.90 04/15/2019   Lab Results  Component Value Date   LDLCALC 154 (H) 04/15/2019   Lab Results  Component Value Date   TRIG 107.0 04/15/2019   Lab Results  Component Value Date   CHOLHDL 5 04/15/2019   Lab Results  Component Value Date   HGBA1C 6.0 04/15/2019       Assessment & Plan:   Problem List Items Addressed This Visit    Hypothyroidism    On Levothyroxine, continue to monitor      Essential hypertension    Monitor  no changes to meds. Encouraged heart healthy diet such as the DASH diet and exercise as tolerated. Most       Obesity    Encouraged DASH diet, decrease po intake and increase exercise as tolerated. Needs 7-8 hours of sleep nightly. Avoid trans fats, eat small, frequent meals every 4-5 hours with lean proteins, complex carbs and healthy fats. 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 Will try 15 mg bid and she will report if any concerns.       Vitamin D deficiency    Supplement and monitor      Diabetes mellitus type 2 in obese (HCC)    hgba1c acceptable, minimize simple carbs. Increase exercise as tolerated. Her sugars have dropped as low as 48 so we will drop her Metformin from 1000 to 500 mg qd and reevaluate. Reminded to eat every 4-5 hours with  minimal carbs and increased protein      Relevant Medications   metFORMIN (GLUCOPHAGE) 1000 MG tablet    Other Visit Diagnoses    Prediabetes       Relevant Medications   metFORMIN (GLUCOPHAGE) 1000 MG tablet      I have discontinued Denishia S. Axelrod's predniSONE and busPIRone. I have also changed her metFORMIN. Additionally, I am having her start on busPIRone. Lastly, I am having her maintain her albuterol, Cod Liver Oil, loratadine, acetaminophen, diphenhydrAMINE, fenofibrate micronized, hyoscyamine, modafinil, Vitamin D (Ergocalciferol), aspirin EC, famotidine, Synthroid, Venlafaxine HCl, metoprolol succinate, Linzess, furosemide, and spironolactone.  Meds ordered this encounter  Medications  . busPIRone (BUSPAR) 15 MG tablet    Sig: Take 1 tablet (15 mg total) by mouth 2 (two) times daily.    Dispense:  60 tablet    Refill:  1  . metFORMIN (GLUCOPHAGE) 1000 MG tablet    Sig: Take 0.5 tablets (500 mg total) by mouth daily with breakfast.    Dispense:  45 tablet    Refill:  0     I discussed the assessment and treatment plan with the patient. The patient was provided an opportunity to ask questions and all were answered. The patient agreed with the plan and demonstrated an understanding of the instructions.   The patient was advised to call back or seek an in-person evaluation if the symptoms worsen or if the condition fails to improve as  anticipated.  I provided 25 minutes of non-face-to-face time during this encounter.   Penni Homans, MD

## 2019-06-22 NOTE — Assessment & Plan Note (Signed)
buspar up to 10 mg somewhat helpful. Will try 15 mg bid and she will report if any concerns.

## 2019-06-22 NOTE — Assessment & Plan Note (Signed)
Encouraged DASH diet, decrease po intake and increase exercise as tolerated. Needs 7-8 hours of sleep nightly. Avoid trans fats, eat small, frequent meals every 4-5 hours with lean proteins, complex carbs and healthy fats. Minimize simple carbs, consider [employer] APP 

## 2019-06-22 NOTE — Assessment & Plan Note (Signed)
On Levothyroxine, continue to monitor 

## 2019-06-23 ENCOUNTER — Other Ambulatory Visit: Payer: Self-pay | Admitting: Family Medicine

## 2019-06-23 ENCOUNTER — Encounter: Payer: Self-pay | Admitting: Family Medicine

## 2019-06-23 ENCOUNTER — Telehealth: Payer: Self-pay

## 2019-06-23 DIAGNOSIS — I10 Essential (primary) hypertension: Secondary | ICD-10-CM

## 2019-06-23 DIAGNOSIS — M5010 Cervical disc disorder with radiculopathy, unspecified cervical region: Secondary | ICD-10-CM

## 2019-06-23 MED ORDER — HYDROCHLOROTHIAZIDE 25 MG PO TABS: 25.0000 mg | ORAL_TABLET | Freq: Every day | ORAL | 1 refills | Status: DC

## 2019-06-23 MED FILL — HYDROCHLOROTHIAZIDE 25 MG T: 25 | 90 days supply | Qty: 90 | Fill #0

## 2019-06-23 NOTE — Telephone Encounter (Signed)
[facility] called stating that they do not do nerve route block of the cervical spine. States they do Regular ESI and if that is what you want if you re-order they can get scheduled.

## 2019-06-24 NOTE — Telephone Encounter (Signed)
Faxed to GI

## 2019-06-24 NOTE — Telephone Encounter (Signed)
Patient notified, scheduled appt

## 2019-06-24 NOTE — Telephone Encounter (Signed)
New order placed.  Please notify [facility] imaging.

## 2019-06-30 ENCOUNTER — Ambulatory Visit (INDEPENDENT_AMBULATORY_CARE_PROVIDER_SITE_OTHER): Payer: 59 | Admitting: Psychology

## 2019-06-30 ENCOUNTER — Other Ambulatory Visit: Payer: Self-pay | Admitting: Family Medicine

## 2019-06-30 DIAGNOSIS — F411 Generalized anxiety disorder: Secondary | ICD-10-CM | POA: Diagnosis not present

## 2019-06-30 MED FILL — PREGABALIN 150 MG CAPS: 150 | 30 days supply | Qty: 60 | Fill #0

## 2019-06-30 MED FILL — FREESTYLE LITE TEST STRIP: 50 days supply | Qty: 50 | Fill #0

## 2019-07-01 ENCOUNTER — Ambulatory Visit
Admission: RE | Admit: 2019-07-01 | Discharge: 2019-07-01 | Disposition: A | Discharge status: Home / Self Care | Payer: 59 | Source: Ambulatory Visit | Attending: Family Medicine | Admitting: Family Medicine

## 2019-07-01 ENCOUNTER — Other Ambulatory Visit: Payer: Self-pay

## 2019-07-01 DIAGNOSIS — M5412 Radiculopathy, cervical region: Secondary | ICD-10-CM | POA: Diagnosis not present

## 2019-07-01 DIAGNOSIS — M5010 Cervical disc disorder with radiculopathy, unspecified cervical region: Secondary | ICD-10-CM

## 2019-07-01 MED ORDER — TRIAMCINOLONE ACETONIDE 40 MG/ML IJ SUSP (RADIOLOGY)
60.0000 mg | Freq: Once | INTRAMUSCULAR | Status: AC
  Administered 2019-07-01: 60 mg via EPIDURAL

## 2019-07-01 MED ORDER — IOPAMIDOL (ISOVUE-M 300) INJECTION 61%
1.0000 mL | Freq: Once | INTRAMUSCULAR | Status: AC | PRN
  Administered 2019-07-01: 1 mL via INTRA_ARTICULAR

## 2019-07-01 NOTE — Discharge Instructions (Signed)

## 2019-07-02 ENCOUNTER — Encounter: Payer: Self-pay | Admitting: Family Medicine

## 2019-07-03 ENCOUNTER — Other Ambulatory Visit: Payer: Self-pay

## 2019-07-03 ENCOUNTER — Encounter: Payer: Self-pay | Admitting: Cardiology

## 2019-07-03 ENCOUNTER — Ambulatory Visit (INDEPENDENT_AMBULATORY_CARE_PROVIDER_SITE_OTHER): Payer: 59 | Admitting: Cardiology

## 2019-07-03 VITALS — BP 130/80 | HR 80 | Ht 64.5 in | Wt 241.0 lb

## 2019-07-03 DIAGNOSIS — G4733 Obstructive sleep apnea (adult) (pediatric): Secondary | ICD-10-CM

## 2019-07-03 DIAGNOSIS — I10 Essential (primary) hypertension: Secondary | ICD-10-CM | POA: Diagnosis not present

## 2019-07-03 DIAGNOSIS — E782 Mixed hyperlipidemia: Secondary | ICD-10-CM

## 2019-07-03 DIAGNOSIS — R079 Chest pain, unspecified: Secondary | ICD-10-CM

## 2019-07-03 NOTE — Patient Instructions (Addendum)
Medication Instructions:  No medication changes *If you need a refill on your cardiac medications before your next appointment, please call your pharmacy*   Lab Work: None ordered If you have labs (blood work) drawn today and your tests are completely normal, you will receive your results only by: Marland Kitchen MyChart Message (if you have MyChart) OR . A paper copy in the mail If you have any lab test that is abnormal or we need to change your treatment, we will call you to review the results.   Testing/Procedures:  We will order CT coronary calcium score  $150  Please call [phone] to schedule    [facility]  [address], [place] [address]    Follow-Up: At [facility], you and your health needs are our priority.  As part of our continuing mission to provide you with exceptional heart care, we have created designated Provider Care Teams.  These Care Teams include your primary Cardiologist (physician) and Advanced Practice Providers (APPs -  Physician Assistants and Nurse Practitioners) who all work together to provide you with the care you need, when you need it.  We recommend signing up for the patient portal called "MyChart".  Sign up information is provided on this After Visit Summary.  MyChart is used to connect with patients for Virtual Visits (Telemedicine).  Patients are able to view lab/test results, encounter notes, upcoming appointments, etc.  Non-urgent messages can be sent to your provider as well.   To learn more about what you can do with MyChart, go to [email or web address].    Your next appointment:   4 month(s)  The format for your next appointment:   In Person  Provider:   Jyl Heinz, MD   Other Instructions NA

## 2019-07-03 NOTE — Addendum Note (Signed)
Addended by: Truddie Hidden on: 07/03/2019 05:03 PM   Modules accepted: Orders

## 2019-07-03 NOTE — Progress Notes (Signed)
Cardiology Office Note:    Date:  07/03/2019   ID:  [name], DOB [birth year], MRN [id number]  PCP:  Mosie Lukes, MD  Cardiologist:  Jenean Lindau, MD   Referring MD: Mosie Lukes, MD    ASSESSMENT:    1. Essential hypertension   2. Hyperlipemia, mixed   3. OSA (obstructive sleep apnea)   4. Morbid obesity ([place])    PLAN:    In order of problems listed above:  1. Chest discomfort: This has resolved.  I discussed with her the results of stress test echocardiogram and these are detailed below.  Primary prevention stressed.  Importance of compliance with diet and medication stressed advised about exercise program. 2. Essential hypertension: Blood pressure stable 3. Mixed dyslipidemia: Diet was discussed.  She tells me she is intolerant to statins.  I would like to get her in for a calcium scoring CT scan.  Based on that I may consider lipid-lowering medications of injectable type with PCSK9. 4. Morbid obesity: Weight reduction stressed and the risks of obesity explained and she promises to do better. 5. Obstructive sleep apnea: Sleep health issues were discussed. 6. Patient will be seen in follow-up appointment in 4 months or earlier if the patient has any concerns    Medication Adjustments/Labs and Tests Ordered: Current medicines are reviewed at length with the patient today.  Concerns regarding medicines are outlined above.  No orders of the defined types were placed in this encounter.  No orders of the defined types were placed in this encounter.    Chief Complaint  Patient presents with  . Follow-up    2 Months     History of Present Illness:    [name] is a 59 y.o. female.  Patient was evaluated by me for chest discomfort.  She has history of mixed dyslipidemia and morbid obesity.  She denies any problems at this time.  She leads a sedentary lifestyle.  No chest pain orthopnea or PND now.  At the time of my evaluation, the patient is alert  awake oriented and in no distress.  Past Medical History:  Diagnosis Date  . Acute bronchitis 05/25/2016  . Anxiety   . Chronic pain syndrome 01/06/2013  . Chronic rhinosinusitis   . Colon polyps   . Cough 01/06/2013  . Depression   . Diabetes ([place]) 01/06/2013  . Diabetes mellitus type 2 in obese [facility]) 01/06/2013   Sees Dr Calvert Cantor for eye exam Does not see podiatry, foot exam today unremarkable except for thick cracking skin on heals   . Dysuria 05/25/2016  . Edema 01/06/2013  . Epistaxis 05/25/2016  . Fibroid, uterine   . GERD (gastroesophageal reflux disease)   . Glaucoma 12-13  . Hoarseness   . Hoarseness of voice 05/11/2013  . Hyperglycemia 04/13/2013  . Hyperlipemia, mixed 06/06/2007   Qualifier: Diagnosis of  By: Wynona Luna She feels Lipitor caused increased low back pain and weakness    . Hyperlipidemia   . Hypertension   . Hypokalemia 02/05/2013  . IBS (irritable bowel syndrome) 02/13/2011  . Low back pain 03/03/2013  . Muscle cramp 05/22/2014  . Obesity   . Obesity, unspecified 05/11/2013  . OSA (obstructive sleep apnea)   . Pain in joint, shoulder region 06/27/2015  . Perimenopause 10/05/2012  . Raynaud disease 06/16/2013  . Thyroid disease    hypothyroidism  . Vocal fold nodules     Past Surgical History:  Procedure Laterality Date  .  ABDOMINAL HYSTERECTOMY    . BREAST EXCISIONAL BIOPSY Right    at age 49  . CHOLECYSTECTOMY    . CYSTECTOMY    . DILATION AND CURETTAGE OF UTERUS     x2  . I & D EXTREMITY Left 04/11/2018   Procedure: DEBIDMENT DISTAL INTERPHALANGEAL LEFT MIDDLE;  Surgeon: Daryll Brod, MD;  Location: [place];  Service: Orthopedics;  Laterality: Left;  Marland Kitchen MASS EXCISION Left 04/11/2018   Procedure: EXCISION MASS;  Surgeon: Daryll Brod, MD;  Location: [place];  Service: Orthopedics;  Laterality: Left;  . OOPHORECTOMY    . POLYPECTOMY    . ROTATOR CUFF REPAIR      Current Medications: Current Meds    Medication Sig  . acetaminophen (TYLENOL) 325 MG tablet Take 650 mg by mouth every 6 (six) hours as needed.  Marland Kitchen albuterol (VENTOLIN HFA) 108 (90 Base) MCG/ACT inhaler INHALE 2 PUFFS INTO THE LUNGS EVERY 4 HOURS AS NEEDED FOR WHEEZING OR SHORTNESS OF BREATH.  Marland Kitchen aspirin EC 81 MG tablet Take 1 tablet (81 mg total) by mouth daily.  . busPIRone (BUSPAR) 15 MG tablet Take 1 tablet (15 mg total) by mouth 2 (two) times daily.  Marland Kitchen Cod Liver Oil 1000 MG CAPS Take 1 capsule by mouth daily.  . diphenhydrAMINE (BENADRYL) 25 MG tablet Take 25 mg by mouth every 6 (six) hours as needed.  . famotidine (PEPCID) 40 MG tablet Take 1 tablet (40 mg total) by mouth at bedtime.  . fenofibrate micronized (LOFIBRA) 134 MG capsule TAKE 1 CAPSULE BY MOUTH DAILY BEFORE BREAKFAST  . FREESTYLE LITE test strip USE TO CHECK BLOOD SUGAR EVERY DAY AND AS NEEDED  . furosemide (LASIX) 20 MG tablet TAKE 1 TO 2 TABLETS BY MOUTH DAILY AS NEEDED FOR FLUID OR EDEMA  . hydrochlorothiazide (HYDRODIURIL) 25 MG tablet Take 1 tablet (25 mg total) by mouth daily.  . hyoscyamine (LEVSIN SL) 0.125 MG SL tablet Place 1 tablet (0.125 mg total) under the tongue every 4 (four) hours as needed.  Marland Kitchen LINZESS 145 MCG CAPS capsule TAKE 1 CAPSULE (145 MCG TOTAL) BY MOUTH DAILY BEFORE BREAKFAST.  Marland Kitchen loratadine (CLARITIN) 10 MG tablet Take 10 mg by mouth daily.  . metFORMIN (GLUCOPHAGE) 1000 MG tablet Take 0.5 tablets (500 mg total) by mouth daily with breakfast.  . metoprolol succinate (TOPROL-XL) 100 MG 24 hr tablet TAKE 1 TABLET (100 MG TOTAL) BY MOUTH DAILY. TAKE WITH OR IMMEDIATELY FOLLOWING A MEAL.  . modafinil (PROVIGIL) 200 MG tablet Take 2 tablets (400 mg total) by mouth daily.  . pregabalin (LYRICA) 150 MG capsule Take 1 capsule (150 mg total) by mouth 2 (two) times daily.  . Prenatal Vit-Fe Fumarate-FA (M-VIT PO) Take by mouth.  . spironolactone (ALDACTONE) 50 MG tablet Take 1 tablet (50 mg total) by mouth 3 (three) times daily.  Marland Kitchen SYNTHROID 100  MCG tablet TAKE 1 TABLET (100 MCG TOTAL) BY MOUTH DAILY BEFORE BREAKFAST.  Marland Kitchen Venlafaxine HCl 150 MG TB24 TAKE 1 TABLET (150 MG TOTAL) BY MOUTH AT BEDTIME.  Marland Kitchen Vitamin D, Ergocalciferol, (DRISDOL) 1.25 MG (50000 UT) CAPS capsule Take 1 capsule (50,000 Units total) by mouth every 7 (seven) days.     Allergies:   Losartan, Wellbutrin [bupropion], Atorvastatin, Codeine, Amoxicillin, Erythromycin, and Penicillins   Social History   Socioeconomic History  . Marital status: Significant Other    Spouse name: Not on file  . Number of children: 0  . Years of education: Not on file  .  Highest education level: Not on file  Occupational History  . 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 Smoking status: Former Smoker    Packs/day: 0.50    Years: 30.00    Pack years: 15.00    Types: Cigarettes    Quit date: 04/25/2003    Years since quitting: 16.2  . Smokeless tobacco: Never Used  . Tobacco comment: smoked since age 32  Substance and Sexual Activity  . Alcohol use: No  . Drug use: Never  . Sexual activity: Yes    Partners: Male  Other Topics Concern  . Not on file  Social History Narrative   Endo-- Dr Dwyane Dee   ENT--Dr shoemaker   GI--Dr Buccini   Pulm--Dr Clance   Rheum--Dr Charlestine Night         Social Determinants of Health   Financial Resource Strain:   . Difficulty of Paying Living Expenses:   Food Insecurity:   . Worried About Charity fundraiser in the Last Year:   . Arboriculturist in the Last Year:   Transportation Needs:   . Film/video editor (Medical):   Marland Kitchen Lack of Transportation (Non-Medical):   Physical Activity:   . Days of Exercise per Week:   . Minutes of Exercise per Session:   Stress:   . Feeling of Stress :   Social Connections:   . Frequency of Communication with Friends and Family:   . Frequency of Social Gatherings with Friends and Family:   . Attends Religious Services:   . Active Member of Clubs or Organizations:   . Attends Theatre manager Meetings:   Marland Kitchen Marital Status:      Family History: The patient's family history includes Alcohol abuse in her father; Anxiety disorder in her mother; Breast cancer (age of onset: 13) in an other family member; Cancer in her father and paternal grandfather; Depression in her mother; Diabetes in her brother, sister, and another family member; Heart disease in her father; High Cholesterol in her mother; High blood pressure in her mother; Hyperlipidemia in her father; Hypertension in her father; Kidney disease in her mother; Obesity in her mother; Stroke in her father.  ROS:   Please see the history of present illness.    All other systems reviewed and are negative.  EKGs/Labs/Other Studies Reviewed:    The following studies were reviewed today: Study Highlights    Nuclear stress EF: 61%. No wall motion abnormalities  There was no ST segment deviation noted during stress.  This is a low risk study. No ischemia identified   Candee Furbish, MD    IMPRESSIONS    1. Left ventricular ejection fraction, by visual estimation, is 60 to  65%. The left ventricle has normal function. There is mildly increased  left ventricular hypertrophy.  2. The left ventricle has no regional wall motion abnormalities.  3. Global right ventricle has normal systolic function.The right  ventricular size is normal. No increase in right ventricular wall  thickness.  4. Left atrial size was normal.  5. Right atrial size was normal.  6. The mitral valve is normal in structure. No evidence of mitral valve  regurgitation. No evidence of mitral stenosis.  7. The tricuspid valve is normal in structure.  8. The aortic valve is normal in structure. Aortic valve regurgitation is  not visualized. No evidence of aortic valve sclerosis or stenosis.  9. The pulmonic valve was normal in structure. Pulmonic valve  regurgitation is not visualized.  10. Unable to determine Pulmonary artery systolic  pressure, No TR jet  spectral display.    Recent Labs: 12/12/2018: Magnesium 1.9 04/15/2019: ALT 19; BUN 14; Creatinine, Ser 0.98; Hemoglobin 13.7; Platelets 401.0; Potassium 3.9; Sodium 139; TSH 0.96  Recent Lipid Panel    Component Value Date/Time   CHOL 226 (H) 04/15/2019 0840   CHOL 210 (H) 07/03/2017 1002   TRIG 107.0 04/15/2019 0840   HDL 49.90 04/15/2019 0840   HDL 47 07/03/2017 1002   CHOLHDL 5 04/15/2019 0840   VLDL 21.4 04/15/2019 0840   LDLCALC 154 (H) 04/15/2019 0840   LDLCALC 146 (H) 07/03/2017 1002   LDLDIRECT 103.0 07/11/2016 0953    Physical Exam:    VS:  BP 130/80   Pulse 80   Ht 5' 4.5" (1.638 m)   Wt 241 lb (109.3 kg)   SpO2 95%   BMI 40.73 kg/m     Wt Readings from Last 3 Encounters:  07/03/19 241 lb (109.3 kg)  06/20/19 244 lb (110.7 kg)  06/20/19 243 lb (110.2 kg)     GEN: Patient is in no acute distress HEENT: Normal NECK: No JVD; No carotid bruits LYMPHATICS: No lymphadenopathy CARDIAC: Hear sounds regular, 2/6 systolic murmur at the apex. RESPIRATORY:  Clear to auscultation without rales, wheezing or rhonchi  ABDOMEN: Soft, non-tender, non-distended MUSCULOSKELETAL:  No edema; No deformity  SKIN: Warm and dry NEUROLOGIC:  Alert and oriented x 3 PSYCHIATRIC:  Normal affect   Signed, Jenean Lindau, MD  07/03/2019 4:24 PM    [facility]

## 2019-07-08 ENCOUNTER — Other Ambulatory Visit: Payer: Self-pay | Admitting: Family Medicine

## 2019-07-08 MED ORDER — VENLAFAXINE HCL ER 150 MG PO CP24: 150.0000 mg | ORAL_CAPSULE | Freq: Every day | ORAL | 1 refills | Status: DC

## 2019-07-08 MED FILL — VENLAFAXINE HCL ER 150 MG C: 150 | 30 days supply | Qty: 30 | Fill #0

## 2019-07-08 NOTE — Addendum Note (Signed)
Addended by: Magdalene Molly A on: 07/08/2019 09:05 AM   Modules accepted: Orders

## 2019-07-09 ENCOUNTER — Telehealth: Payer: Self-pay | Admitting: Family Medicine

## 2019-07-09 ENCOUNTER — Ambulatory Visit (INDEPENDENT_AMBULATORY_CARE_PROVIDER_SITE_OTHER): Payer: 59 | Admitting: Psychology

## 2019-07-09 DIAGNOSIS — F411 Generalized anxiety disorder: Secondary | ICD-10-CM | POA: Diagnosis not present

## 2019-07-09 NOTE — Telephone Encounter (Signed)
ADA form completed.  There is a can part of that that the patient will need to fill out as well.  We will make a photocopy of our part and patient can complete her part and we will fax it or she can fax it. Please notify patient.

## 2019-07-09 NOTE — Telephone Encounter (Signed)
Called pt and informed her of the status of her ADA/FMLA forms.  Pt states that she will complete her portion of the forms and will drop them off by the office.

## 2019-07-11 ENCOUNTER — Other Ambulatory Visit: Payer: Self-pay | Admitting: Family Medicine

## 2019-07-11 MED FILL — MODAFINIL 200 MG TABLET: 200 | 45 days supply | Qty: 90 | Fill #0

## 2019-07-11 NOTE — Telephone Encounter (Signed)
Requesting:provigil  Contract:n/a UDS:n/a Last OV:06/20/19 Next OV:08/26/19 Last Refill:02/21/19  #[id number] Database:   Please advise

## 2019-07-13 ENCOUNTER — Encounter: Payer: Self-pay | Admitting: Family Medicine

## 2019-07-14 ENCOUNTER — Other Ambulatory Visit: Payer: Self-pay | Admitting: Family Medicine

## 2019-07-14 ENCOUNTER — Other Ambulatory Visit: Payer: Self-pay

## 2019-07-14 DIAGNOSIS — M25561 Pain in right knee: Secondary | ICD-10-CM

## 2019-07-14 NOTE — Progress Notes (Unsigned)
Dg knee   

## 2019-07-15 ENCOUNTER — Ambulatory Visit (HOSPITAL_BASED_OUTPATIENT_CLINIC_OR_DEPARTMENT_OTHER)
Admission: RE | Admit: 2019-07-15 | Discharge: 2019-07-15 | Disposition: A | Discharge status: Home / Self Care | Payer: 59 | Source: Ambulatory Visit | Attending: Family Medicine | Admitting: Family Medicine

## 2019-07-15 ENCOUNTER — Other Ambulatory Visit: Payer: Self-pay | Admitting: *Deleted

## 2019-07-15 ENCOUNTER — Ambulatory Visit (INDEPENDENT_AMBULATORY_CARE_PROVIDER_SITE_OTHER): Payer: 59 | Admitting: Family Medicine

## 2019-07-15 ENCOUNTER — Other Ambulatory Visit (INDEPENDENT_AMBULATORY_CARE_PROVIDER_SITE_OTHER): Payer: 59

## 2019-07-15 VITALS — BP 115/77 | HR 70

## 2019-07-15 DIAGNOSIS — M25562 Pain in left knee: Secondary | ICD-10-CM | POA: Insufficient documentation

## 2019-07-15 DIAGNOSIS — E039 Hypothyroidism, unspecified: Secondary | ICD-10-CM

## 2019-07-15 DIAGNOSIS — E871 Hypo-osmolality and hyponatremia: Secondary | ICD-10-CM

## 2019-07-15 DIAGNOSIS — M25561 Pain in right knee: Secondary | ICD-10-CM | POA: Diagnosis not present

## 2019-07-15 DIAGNOSIS — S8992XA Unspecified injury of left lower leg, initial encounter: Secondary | ICD-10-CM | POA: Diagnosis not present

## 2019-07-15 DIAGNOSIS — E1169 Type 2 diabetes mellitus with other specified complication: Secondary | ICD-10-CM

## 2019-07-15 DIAGNOSIS — E669 Obesity, unspecified: Secondary | ICD-10-CM

## 2019-07-15 DIAGNOSIS — I10 Essential (primary) hypertension: Secondary | ICD-10-CM | POA: Diagnosis not present

## 2019-07-15 DIAGNOSIS — S8991XA Unspecified injury of right lower leg, initial encounter: Secondary | ICD-10-CM | POA: Diagnosis not present

## 2019-07-15 DIAGNOSIS — E782 Mixed hyperlipidemia: Secondary | ICD-10-CM

## 2019-07-15 LAB — COMPREHENSIVE METABOLIC PANEL
ALT: 23 U/L (ref 0–35)
AST: 22 U/L (ref 0–37)
Albumin: 4.7 g/dL (ref 3.5–5.2)
Alkaline Phosphatase: 47 U/L (ref 39–117)
BUN: 29 mg/dL — ABNORMAL HIGH (ref 6–23)
CO2: 26 mEq/L (ref 19–32)
Calcium: 10.6 mg/dL — ABNORMAL HIGH (ref 8.4–10.5)
Chloride: 97 mEq/L (ref 96–112)
Creatinine, Ser: 1.23 mg/dL — ABNORMAL HIGH (ref 0.40–1.20)
GFR: 54.14 mL/min — ABNORMAL LOW (ref >60.00)
Glucose, Bld: 84 mg/dL (ref 70–99)
Potassium: 4.4 mEq/L (ref 3.5–5.1)
Sodium: 131 mEq/L — ABNORMAL LOW (ref 135–145)
Total Bilirubin: 0.6 mg/dL (ref 0.2–1.2)
Total Protein: 7.5 g/dL (ref 6.0–8.3)

## 2019-07-15 NOTE — Progress Notes (Addendum)
Pt here for Blood pressure check per   Pt currently takes: hctz 25 mg, metoprolol 100 mg,    Pt reports compliance with medication.  BP today @ = 115 77  HR =70  Advised patient bp looks great and we will call her if Dr. Charlett Blake makes any changes.   BP Readings from Last 3 Encounters:  07/03/19 130/80  07/01/19 (!) 143/89  06/20/19 130/78   Nursing note reviewed. Agree with documention and plan.

## 2019-07-17 ENCOUNTER — Other Ambulatory Visit: Payer: Self-pay

## 2019-07-17 ENCOUNTER — Ambulatory Visit (INDEPENDENT_AMBULATORY_CARE_PROVIDER_SITE_OTHER)
Admission: RE | Admit: 2019-07-17 | Discharge: 2019-07-17 | Disposition: A | Discharge status: Home / Self Care | Payer: Self-pay | Source: Ambulatory Visit | Attending: Cardiology | Admitting: Cardiology

## 2019-07-17 ENCOUNTER — Other Ambulatory Visit: Payer: Self-pay | Admitting: Family Medicine

## 2019-07-17 DIAGNOSIS — R079 Chest pain, unspecified: Secondary | ICD-10-CM

## 2019-07-17 MED FILL — FUROSEMIDE 20 MG TABS: 20 | 20 days supply | Qty: 40 | Fill #0

## 2019-07-17 MED FILL — busPIRone HCL 15 MG TABS: 15 | 30 days supply | Qty: 60 | Fill #1

## 2019-07-17 MED FILL — METOPROLOL SUCCINATE ER 100: 100 | 30 days supply | Qty: 30 | Fill #0

## 2019-07-29 ENCOUNTER — Other Ambulatory Visit (INDEPENDENT_AMBULATORY_CARE_PROVIDER_SITE_OTHER): Payer: 59

## 2019-07-29 ENCOUNTER — Encounter: Payer: Self-pay | Admitting: Family Medicine

## 2019-07-29 ENCOUNTER — Other Ambulatory Visit: Payer: Self-pay | Admitting: Family Medicine

## 2019-07-29 ENCOUNTER — Other Ambulatory Visit: Payer: Self-pay

## 2019-07-29 DIAGNOSIS — M791 Myalgia, unspecified site: Secondary | ICD-10-CM

## 2019-07-29 DIAGNOSIS — I10 Essential (primary) hypertension: Secondary | ICD-10-CM | POA: Diagnosis not present

## 2019-07-29 DIAGNOSIS — E669 Obesity, unspecified: Secondary | ICD-10-CM | POA: Diagnosis not present

## 2019-07-29 DIAGNOSIS — E1169 Type 2 diabetes mellitus with other specified complication: Secondary | ICD-10-CM

## 2019-07-29 DIAGNOSIS — K219 Gastro-esophageal reflux disease without esophagitis: Secondary | ICD-10-CM | POA: Diagnosis not present

## 2019-07-29 DIAGNOSIS — R000 Tachycardia, unspecified: Secondary | ICD-10-CM | POA: Diagnosis not present

## 2019-07-29 DIAGNOSIS — E782 Mixed hyperlipidemia: Secondary | ICD-10-CM | POA: Diagnosis not present

## 2019-07-29 DIAGNOSIS — E039 Hypothyroidism, unspecified: Secondary | ICD-10-CM

## 2019-07-29 DIAGNOSIS — E119 Type 2 diabetes mellitus without complications: Secondary | ICD-10-CM

## 2019-07-29 LAB — LIPID PANEL
Cholesterol: 236 mg/dL — ABNORMAL HIGH (ref 0–200)
HDL: 60.5 mg/dL (ref >39.00)
LDL Cholesterol: 148 mg/dL — ABNORMAL HIGH (ref 0–99)
NonHDL: 175.01
Total CHOL/HDL Ratio: 4
Triglycerides: 133 mg/dL (ref 0.0–149.0)
VLDL: 26.6 mg/dL (ref 0.0–40.0)

## 2019-07-29 LAB — CBC
HCT: 40 % (ref 36.0–46.0)
Hemoglobin: 13.5 g/dL (ref 12.0–15.0)
MCHC: 33.7 g/dL (ref 30.0–36.0)
MCV: 91.5 fl (ref 78.0–100.0)
Platelets: 422 10*3/uL — ABNORMAL HIGH (ref 150.0–400.0)
RBC: 4.38 Mil/uL (ref 3.87–5.11)
RDW: 14.2 % (ref 11.5–15.5)
WBC: 4.1 10*3/uL (ref 4.0–10.5)

## 2019-07-29 LAB — COMPREHENSIVE METABOLIC PANEL
ALT: 20 U/L (ref 0–35)
AST: 19 U/L (ref 0–37)
Albumin: 4.4 g/dL (ref 3.5–5.2)
Alkaline Phosphatase: 47 U/L (ref 39–117)
BUN: 17 mg/dL (ref 6–23)
CO2: 27 mEq/L (ref 19–32)
Calcium: 10.2 mg/dL (ref 8.4–10.5)
Chloride: 103 mEq/L (ref 96–112)
Creatinine, Ser: 0.98 mg/dL (ref 0.40–1.20)
GFR: 70.37 mL/min (ref >60.00)
Glucose, Bld: 103 mg/dL — ABNORMAL HIGH (ref 70–99)
Potassium: 4.5 mEq/L (ref 3.5–5.1)
Sodium: 137 mEq/L (ref 135–145)
Total Bilirubin: 0.3 mg/dL (ref 0.2–1.2)
Total Protein: 6.9 g/dL (ref 6.0–8.3)

## 2019-07-29 LAB — HEMOGLOBIN A1C: Hgb A1c MFr Bld: 6.2 % (ref 4.6–6.5)

## 2019-07-29 LAB — TSH: TSH: 0.88 u[IU]/mL (ref 0.35–4.50)

## 2019-07-29 MED FILL — FENOFIBRATE 134 MG CAPSULE: 134 | 90 days supply | Qty: 90 | Fill #0

## 2019-07-29 MED FILL — PREGABALIN 150 MG CAPS: 150 | 30 days supply | Qty: 60 | Fill #1

## 2019-07-29 MED FILL — VIT D2 1.25 MG (50,000 UNIT: 1.25 MG | 84 days supply | Qty: 12 | Fill #0

## 2019-07-29 MED FILL — SPIRONOLACTONE 50 MG TABLET: 50 | 30 days supply | Qty: 90 | Fill #1

## 2019-07-30 ENCOUNTER — Ambulatory Visit: Payer: 59 | Admitting: Acute Care

## 2019-07-30 ENCOUNTER — Encounter: Payer: Self-pay | Admitting: Acute Care

## 2019-07-30 ENCOUNTER — Ambulatory Visit (INDEPENDENT_AMBULATORY_CARE_PROVIDER_SITE_OTHER): Payer: 59 | Admitting: Acute Care

## 2019-07-30 DIAGNOSIS — G4733 Obstructive sleep apnea (adult) (pediatric): Secondary | ICD-10-CM

## 2019-07-30 DIAGNOSIS — Z87891 Personal history of nicotine dependence: Secondary | ICD-10-CM

## 2019-07-30 LAB — IRON,TIBC AND FERRITIN PANEL
%SAT: 25 % (calc) (ref 16–45)
Ferritin: 102 ng/mL (ref 16–232)
Iron: 111 ug/dL (ref 45–160)
TIBC: 442 mcg/dL (calc) (ref 250–450)

## 2019-07-30 MED FILL — SYNTHROID 100 MCG TABLET: 100 | 90 days supply | Qty: 90 | Fill #1

## 2019-07-30 NOTE — Progress Notes (Signed)
Virtual Visit via Telephone Note  I connected with [name] on 07/30/19 at 10:30 AM EDT by telephone and verified that I am speaking with the correct person using two identifiers.  Location: Patient: at home Provider: [place], [place], [place], [address]    I discussed the limitations, risks, security and privacy concerns of performing an evaluation and management service by telephone and the availability of in person appointments. I also discussed with the patient that there may be a patient responsible charge related to this service. The patient expressed understanding and agreed to proceed.  Last OV 08/07/2018  Synopsis Gina S Parkeris a 59 y.o.femaleformer smoker, Quit 2005 with obstructive sleep apnea and CPAP use.She takes Provigil 400 mg daily. She is followed by Dr. Halford Chessman.    History of Present Illness: Pt. Presents for phone visit for annual check for OSA and CPAP use. Down load shows 100% compliance. She states she is using her Provigil 400 mg daily when she works. The dosage was increase from 300 to 400 mg a year ago, and this has been helpful. She states she feels good on her CPAP. She is using this for naps too. She has no daytime sleepiness. She does have some complaints of arm and shoulder pain. She did have a fall about 4 weeks ago.They feel this was secondary to either blood sugar low or blood pressure or her low sodium  ( 131) 2 weeks ago. This has corrected with her last labs.( 137) She also had an epidural injection prior. She feels this could be from either of these issues. She does have an appointment with Dr. Charlett Blake 08/26/2019. I have told her to call for a sooner appointment if she has any dizziness or changes. I also told her to seek emergency care if this happens again, or if she feels dizzy. She agreed to do this. She states she has had some headaches but she feels this is from allergies. She does have fatigue, but she feels this may be due to her  arm and shoulder pain.  She states she has adequate supplies.    Observations/Objective:  Down Load 06/30/2019-07/29/2019 Usage 30 of 30 days or 100% Usage greater than 4 hours 30 days or 100% Usage hours average 7 hours 19 minutes Air sense 10 AutoSet minimum pressure 5 cm H2O maximum pressure 20 cm H2O Median pressure is 11 cm H2O AHI 0.8 Minimal leaks  Sleep tests: PSG 08/12/06 >> AHI 5, SpO2 85% HST 2013 >> AHI 12 Auto CPAP 06/1317 to 08/02/17 >> used on 30 of 30 nights with average 8 hrs 16 hrs.  Average AHI 0.9 with median CPAP 10 and 95 th percentile CPAP 14 cm H2O  Cardiac tests: Echo 01/21/16 >> mild LVH, EF 55 to 60%, grade 1 DD  Pulmonary tests: PFT 02/25/16 >> FEV1 2.36 (107%), FEV1% 88, TLC 4.02 (79%), DLCO 76%  Assessment and Plan: OSA 100% compliant AHI 0.8 Plan Continue on CPAP at bedtime. You appear to be benefiting from the treatment  Goal is to wear for at least 6 hours each night for maximal clinical benefit. Continue to work on weight loss, as the link between excess weight  and sleep apnea is well established.   Remember to establish a good bedtime routine, and work on sleep hygiene.  Limit daytime naps , avoid stimulants such as caffeine and nicotine close to bedtime, exercise daily to promote sleep quality, avoid heavy , spicy, fried , or rich foods before  bed. Ensure adequate exposure to natural light during the day,establish a relaxing bedtime routine with a pleasant sleep environment ( Bedroom between 60 and 67 degrees, turn off bright lights , TV or device screens screens , consider black out curtains or white noise machines) Do not drive if sleepy. Remember to clean mask, tubing, filter, and reservoir once weekly with soapy water.  Follow up with Judson Roch NP or Dr. Halford Chessman in 12 months   History of Tobacco Abuse Former smoker Plan Evaluate if pt qualifies for Lung Cancer Screening If she does, schedule SDMV for 09/2019  Follow Up Instructions: Follow up in  12  Months or sooner as needed with Dr. Halford Chessman or Judson Roch NP Follow up with Dr. Charlett Blake 08/26/2019 as is scheduled Seek emergency care for any additional falls or dizziness    I discussed the assessment and treatment plan with the patient. The patient was provided an opportunity to ask questions and all were answered. The patient agreed with the plan and demonstrated an understanding of the instructions.   The patient was advised to call back or seek an in-person evaluation if the symptoms worsen or if the condition fails to improve as anticipated.  I provided 30 minutes of non-face-to-face time during this encounter.   Magdalen Spatz, NP 07/30/2019 10:55 AM

## 2019-07-30 NOTE — Patient Instructions (Addendum)
It was good to talk with you today We will refer you to the Lung Cancer Screening Program.  We will see if you qualify for screening We will start in June 2021 if you do. Continue on CPAP at bedtime. You appear to be benefiting from the treatment  Goal is to wear for at least 6 hours each night for maximal clinical benefit. Continue to work on weight loss, as the link between excess weight  and sleep apnea is well established.   Remember to establish a good bedtime routine, and work on sleep hygiene.  Limit daytime naps , avoid stimulants such as caffeine and nicotine close to bedtime, exercise daily to promote sleep quality, avoid heavy , spicy, fried , or rich foods before bed. Ensure adequate exposure to natural light during the day,establish a relaxing bedtime routine with a pleasant sleep environment ( Bedroom between 60 and 67 degrees, turn off bright lights , TV or device screens screens , consider black out curtains or white noise machines) Do not drive if sleepy. Remember to clean mask, tubing, filter, and reservoir once weekly with soapy water.  Follow up with Dr. Halford Chessman or Judson Roch in 12 months  or before as needed.   Pt. Was seen remotely for the last 2 years , she may need an in office follow up for 2022.

## 2019-07-30 NOTE — Progress Notes (Signed)
Reviewed and agree with assessment/plan.   Melbert Botelho, MD [facility] 04/19/2016, 12:24 PM Pager:  [phone]  

## 2019-08-04 ENCOUNTER — Ambulatory Visit: Payer: 59 | Admitting: Acute Care

## 2019-08-08 MED FILL — VENLAFAXINE HCL ER 150 MG C: 150 | 30 days supply | Qty: 30 | Fill #1

## 2019-08-08 MED FILL — FUROSEMIDE 20 MG TABS: 20 | 20 days supply | Qty: 40 | Fill #1

## 2019-08-13 ENCOUNTER — Other Ambulatory Visit: Payer: Self-pay | Admitting: Family Medicine

## 2019-08-14 MED FILL — busPIRone HCL 15 MG TABS: 15 | 30 days supply | Qty: 60 | Fill #0

## 2019-08-15 ENCOUNTER — Ambulatory Visit (INDEPENDENT_AMBULATORY_CARE_PROVIDER_SITE_OTHER): Payer: 59 | Admitting: Psychology

## 2019-08-15 DIAGNOSIS — F411 Generalized anxiety disorder: Secondary | ICD-10-CM

## 2019-08-16 ENCOUNTER — Encounter: Payer: Self-pay | Admitting: Family Medicine

## 2019-08-18 ENCOUNTER — Other Ambulatory Visit: Payer: Self-pay | Admitting: Family Medicine

## 2019-08-18 DIAGNOSIS — R350 Frequency of micturition: Secondary | ICD-10-CM

## 2019-08-18 DIAGNOSIS — M791 Myalgia, unspecified site: Secondary | ICD-10-CM

## 2019-08-18 DIAGNOSIS — I10 Essential (primary) hypertension: Secondary | ICD-10-CM

## 2019-08-18 DIAGNOSIS — E1169 Type 2 diabetes mellitus with other specified complication: Secondary | ICD-10-CM

## 2019-08-19 ENCOUNTER — Other Ambulatory Visit (INDEPENDENT_AMBULATORY_CARE_PROVIDER_SITE_OTHER): Payer: 59

## 2019-08-19 ENCOUNTER — Other Ambulatory Visit: Payer: Self-pay

## 2019-08-19 DIAGNOSIS — M791 Myalgia, unspecified site: Secondary | ICD-10-CM | POA: Diagnosis not present

## 2019-08-19 DIAGNOSIS — R350 Frequency of micturition: Secondary | ICD-10-CM

## 2019-08-19 DIAGNOSIS — E669 Obesity, unspecified: Secondary | ICD-10-CM

## 2019-08-19 DIAGNOSIS — I10 Essential (primary) hypertension: Secondary | ICD-10-CM | POA: Diagnosis not present

## 2019-08-19 DIAGNOSIS — E1169 Type 2 diabetes mellitus with other specified complication: Secondary | ICD-10-CM | POA: Diagnosis not present

## 2019-08-19 LAB — URINALYSIS
Bilirubin Urine: NEGATIVE
Hgb urine dipstick: NEGATIVE
Ketones, ur: NEGATIVE
Leukocytes,Ua: NEGATIVE
Nitrite: NEGATIVE
Specific Gravity, Urine: 1.015 (ref 1.000–1.030)
Total Protein, Urine: NEGATIVE
Urine Glucose: NEGATIVE
Urobilinogen, UA: 0.2 (ref 0.0–1.0)
pH: 6 (ref 5.0–8.0)

## 2019-08-19 LAB — MICROALBUMIN / CREATININE URINE RATIO
Creatinine,U: 58.1 mg/dL
Microalb Creat Ratio: 1.2 mg/g (ref 0.0–30.0)
Microalb, Ur: <0.7 mg/dL (ref 0.0–1.9)

## 2019-08-19 LAB — CK: Total CK: 233 U/L — ABNORMAL HIGH (ref 7–177)

## 2019-08-20 MED FILL — METOPROLOL SUCCINATE ER 100: 100 | 30 days supply | Qty: 30 | Fill #1

## 2019-08-20 MED FILL — LINZESS 145 MCG CAPSULE: 145 | 90 days supply | Qty: 90 | Fill #1

## 2019-08-25 ENCOUNTER — Encounter (INDEPENDENT_AMBULATORY_CARE_PROVIDER_SITE_OTHER): Payer: Self-pay

## 2019-08-26 ENCOUNTER — Other Ambulatory Visit: Payer: Self-pay

## 2019-08-26 ENCOUNTER — Encounter: Payer: Self-pay | Admitting: Family Medicine

## 2019-08-26 ENCOUNTER — Other Ambulatory Visit: Payer: Self-pay | Admitting: Family Medicine

## 2019-08-26 ENCOUNTER — Ambulatory Visit: Payer: 59 | Admitting: Family Medicine

## 2019-08-26 VITALS — BP 110/70 | HR 80 | Temp 96.9°F | Resp 12 | Ht 65.0 in | Wt 252.8 lb

## 2019-08-26 DIAGNOSIS — E1169 Type 2 diabetes mellitus with other specified complication: Secondary | ICD-10-CM | POA: Diagnosis not present

## 2019-08-26 DIAGNOSIS — E782 Mixed hyperlipidemia: Secondary | ICD-10-CM

## 2019-08-26 DIAGNOSIS — I10 Essential (primary) hypertension: Secondary | ICD-10-CM

## 2019-08-26 DIAGNOSIS — K589 Irritable bowel syndrome without diarrhea: Secondary | ICD-10-CM | POA: Diagnosis not present

## 2019-08-26 DIAGNOSIS — M791 Myalgia, unspecified site: Secondary | ICD-10-CM | POA: Diagnosis not present

## 2019-08-26 DIAGNOSIS — M79601 Pain in right arm: Secondary | ICD-10-CM

## 2019-08-26 DIAGNOSIS — E669 Obesity, unspecified: Secondary | ICD-10-CM

## 2019-08-26 DIAGNOSIS — E559 Vitamin D deficiency, unspecified: Secondary | ICD-10-CM | POA: Diagnosis not present

## 2019-08-26 DIAGNOSIS — E039 Hypothyroidism, unspecified: Secondary | ICD-10-CM

## 2019-08-26 LAB — T4, FREE: Free T4: 1 ng/dL (ref 0.60–1.60)

## 2019-08-26 LAB — VITAMIN D 25 HYDROXY (VIT D DEFICIENCY, FRACTURES): VITD: 44.33 ng/mL (ref 30.00–100.00)

## 2019-08-26 LAB — T3, FREE: T3, Free: 4.4 pg/mL — ABNORMAL HIGH (ref 2.3–4.2)

## 2019-08-26 LAB — CK: Total CK: 284 U/L — ABNORMAL HIGH (ref 7–177)

## 2019-08-26 LAB — SEDIMENTATION RATE: Sed Rate: 28 mm/hr (ref 0–30)

## 2019-08-26 MED ORDER — FENOFIBRATE MICRONIZED 134 MG PO CAPS: 134.0000 mg | ORAL_CAPSULE | Freq: Every day | ORAL | 3 refills | Status: DC

## 2019-08-26 MED ORDER — LINACLOTIDE 290 MCG PO CAPS: 290.0000 ug | ORAL_CAPSULE | Freq: Every day | ORAL | 1 refills | Status: DC

## 2019-08-26 MED ORDER — MAGIC MOUTHWASH: 5.0000 mL | Freq: Four times a day (QID) | ORAL | 1 refills | Status: DC | PRN

## 2019-08-26 MED ORDER — VITAMIN D (ERGOCALCIFEROL) 1.25 MG (50000 UNIT) PO CAPS: 50000.0000 [IU] | ORAL_CAPSULE | ORAL | 4 refills | Status: DC

## 2019-08-26 MED ORDER — PREGABALIN 150 MG PO CAPS: 150.0000 mg | ORAL_CAPSULE | Freq: Two times a day (BID) | ORAL | 1 refills | Status: DC

## 2019-08-26 MED ORDER — METOPROLOL SUCCINATE ER 100 MG PO TB24: 100.0000 mg | ORAL_TABLET | Freq: Every day | ORAL | 3 refills | Status: DC

## 2019-08-26 MED ORDER — FUROSEMIDE 20 MG PO TABS: 20.0000 mg | ORAL_TABLET | Freq: Every day | ORAL | 1 refills | Status: DC | PRN

## 2019-08-26 MED ORDER — SPIRONOLACTONE 50 MG PO TABS: 50.0000 mg | ORAL_TABLET | Freq: Three times a day (TID) | ORAL | 3 refills | Status: DC

## 2019-08-26 MED FILL — FUROSEMIDE 20 MG TABS: 20 | 90 days supply | Qty: 90 | Fill #0

## 2019-08-26 MED FILL — LINZESS 290 MCG CAPSULE: 290 | 90 days supply | Qty: 90 | Fill #0

## 2019-08-26 MED FILL — MAGIC MOUTHWASH BOP FORM: 12 days supply | Qty: 240 | Fill #0

## 2019-08-26 MED FILL — SPIRONOLACTONE 50 MG TABLET: 50 | 90 days supply | Qty: 270 | Fill #0

## 2019-08-26 MED FILL — PREGABALIN 150 MG CAPS: 150 | 90 days supply | Qty: 180 | Fill #0

## 2019-08-26 NOTE — Patient Instructions (Signed)
     Syncope Syncope is when you pass out (faint) for a short time. It is caused by a sudden decrease in blood flow to the brain. Signs that you may be about to pass out include:  Feeling dizzy or light-headed.  Feeling sick to your stomach (nauseous).  Seeing all white or all black.  Having cold, clammy skin. If you pass out, get help right away. Call your local emergency services (911 in the [place] Do not drive yourself to the hospital. Follow these instructions at home: Watch for any changes in your symptoms. Take these actions to stay safe and help with your symptoms: Lifestyle  Do not drive, use machinery, or play sports until your doctor says it is okay.  Do not drink alcohol.  Do not use any products that contain nicotine or tobacco, such as cigarettes and e-cigarettes. If you need help quitting, ask your doctor.  Drink enough fluid to keep your pee (urine) pale yellow. General instructions  Take over-the-counter and prescription medicines only as told by your doctor.  If you are taking blood pressure or heart medicine, sit up and stand up slowly. Spend a few minutes getting ready to sit and then stand. This can help you feel less dizzy.  Have someone stay with you until you feel stable.  If you start to feel like you might pass out, lie down right away and raise (elevate) your feet above the level of your heart. Breathe deeply and steadily. Wait until all of the symptoms are gone.  Keep all follow-up visits as told by your doctor. This is important. Get help right away if:  You have a very bad headache.  You pass out once or more than once.  You have pain in your chest, belly, or back.  You have a very fast or uneven heartbeat (palpitations).  It hurts to breathe.  You are bleeding from your mouth or your bottom (rectum).  You have black or tarry poop (stool).  You have jerky movements that you cannot control (seizure).  You are confused.  You have  trouble walking.  You are very weak.  You have vision problems. These symptoms may be an emergency. Do not wait to see if the symptoms will go away. Get medical help right away. Call your local emergency services (911 in the [place] Do not drive yourself to the hospital. Summary  Syncope is when you pass out (faint) for a short time. It is caused by a sudden decrease in blood flow to the brain.  Signs that you may be about to faint include feeling dizzy, light-headed, or sick to your stomach, seeing all white or all black, or having cold, clammy skin.  If you start to feel like you might pass out, lie down right away and raise (elevate) your feet above the level of your heart. Breathe deeply and steadily. Wait until all of the symptoms are gone. This information is not intended to replace advice given to you by your health care provider. Make sure you discuss any questions you have with your health care provider. Document Revised: 05/23/2017 Document Reviewed: 05/23/2017 Elsevier Patient Education  2020 [employer]  

## 2019-08-27 ENCOUNTER — Other Ambulatory Visit: Payer: Self-pay | Admitting: *Deleted

## 2019-08-27 DIAGNOSIS — R700 Elevated erythrocyte sedimentation rate: Secondary | ICD-10-CM

## 2019-08-27 DIAGNOSIS — R748 Abnormal levels of other serum enzymes: Secondary | ICD-10-CM

## 2019-08-27 LAB — THYROID PEROXIDASE ANTIBODY: Thyroperoxidase Ab SerPl-aCnc: 1 IU/mL (ref <9)

## 2019-08-27 NOTE — Progress Notes (Signed)
Subjective:    Patient ID: [name], female    DOB: 03-Aug-1960, 59 y.o.   MRN: [id number]  Chief Complaint  Patient presents with  . 3 month follow up  . Fall    hurt     HPI Patient is in today for follow up on chronic medical concerns. She is struggling with myalgias and fatigue. She notes some weakness in her legs and shoulders when she is standing or driving for a period of time. She has been getting stem cell treatments in her right wrist. She denies any recent febrile illness. Denies CP/palp/SOB/HA/congestion/fevers/GI or GU c/o. Taking meds as prescribed. She is struggling with low back and bilateral posterior hip pain.   Past Medical History:  Diagnosis Date  . Acute bronchitis 05/25/2016  . Anxiety   . Chronic pain syndrome 01/06/2013  . Chronic rhinosinusitis   . Colon polyps   . Cough 01/06/2013  . Depression   . Diabetes ([place]) 01/06/2013  . Diabetes mellitus type 2 in obese [facility]) 01/06/2013   Sees Dr Calvert Cantor for eye exam Does not see podiatry, foot exam today unremarkable except for thick cracking skin on heals   . Dysuria 05/25/2016  . Edema 01/06/2013  . Epistaxis 05/25/2016  . Fibroid, uterine   . GERD (gastroesophageal reflux disease)   . Glaucoma 12-13  . Hoarseness   . Hoarseness of voice 05/11/2013  . Hyperglycemia 04/13/2013  . Hyperlipemia, mixed 06/06/2007   Qualifier: Diagnosis of  By: Wynona Luna She feels Lipitor caused increased low back pain and weakness    . Hyperlipidemia   . Hypertension   . Hypokalemia 02/05/2013  . IBS (irritable bowel syndrome) 02/13/2011  . Low back pain 03/03/2013  . Muscle cramp 05/22/2014  . Obesity   . Obesity, unspecified 05/11/2013  . OSA (obstructive sleep apnea)   . Pain in joint, shoulder region 06/27/2015  . Perimenopause 10/05/2012  . Raynaud disease 06/16/2013  . Thyroid disease    hypothyroidism  . Vocal fold nodules     Past Surgical History:  Procedure Laterality Date  . ABDOMINAL HYSTERECTOMY     . BREAST EXCISIONAL BIOPSY Right    at age 2  . CHOLECYSTECTOMY    . CYSTECTOMY    . DILATION AND CURETTAGE OF UTERUS     x2  . I & D EXTREMITY Left 04/11/2018   Procedure: DEBIDMENT DISTAL INTERPHALANGEAL LEFT MIDDLE;  Surgeon: Daryll Brod, MD;  Location: [facility];  Service: Orthopedics;  Laterality: Left;  Marland Kitchen MASS EXCISION Left 04/11/2018   Procedure: EXCISION MASS;  Surgeon: Daryll Brod, MD;  Location: [place];  Service: Orthopedics;  Laterality: Left;  . OOPHORECTOMY    . POLYPECTOMY    . ROTATOR CUFF REPAIR      Family History  Problem Relation Age of Onset  . Alcohol abuse Father   . Cancer Father        renal and colon  . Hyperlipidemia Father   . Hypertension Father   . Stroke Father   . Heart disease Father   . Cancer Paternal Grandfather        colon  . Diabetes Other   . High blood pressure Mother   . High Cholesterol Mother   . Kidney disease Mother   . Depression Mother   . Anxiety disorder Mother   . Obesity Mother   . Breast cancer Other 45  . Diabetes Sister   . Diabetes Brother  Social History   Socioeconomic History  . Marital status: Significant Other    Spouse name: Not on file  . Number of children: 0  . Years of education: Not on file  . Highest education level: Not on file  Occupational History  . 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 Smoking status: Former Smoker    Packs/day: 0.75    Years: 42.00    Pack years: 31.50    Types: Cigarettes    Quit date: 04/25/2003    Years since quitting: 16.3  . Smokeless tobacco: Never Used  . Tobacco comment: smoked since age 64  Substance and Sexual Activity  . Alcohol use: No  . Drug use: Never  . Sexual activity: Yes    Partners: Male  Other Topics Concern  . Not on file  Social History Narrative   Endo-- Dr Dwyane Dee   ENT--Dr shoemaker   GI--Dr Buccini   Pulm--Dr Clance   Rheum--Dr Charlestine Night         Social Determinants of Health    Financial Resource Strain:   . Difficulty of Paying Living Expenses:   Food Insecurity:   . Worried About Charity fundraiser in the Last Year:   . Arboriculturist in the Last Year:   Transportation Needs:   . Film/video editor (Medical):   Marland Kitchen Lack of Transportation (Non-Medical):   Physical Activity:   . Days of Exercise per Week:   . Minutes of Exercise per Session:   Stress:   . Feeling of Stress :   Social Connections:   . Frequency of Communication with Friends and Family:   . Frequency of Social Gatherings with Friends and Family:   . Attends Religious Services:   . Active Member of Clubs or Organizations:   . Attends Archivist Meetings:   Marland Kitchen Marital Status:   Intimate Partner Violence:   . Fear of Current or Ex-Partner:   . Emotionally Abused:   Marland Kitchen Physically Abused:   . Sexually Abused:     Outpatient Medications Prior to Visit  Medication Sig Dispense Refill  . acetaminophen (TYLENOL) 325 MG tablet Take 650 mg by mouth every 6 (six) hours as needed.    Marland Kitchen albuterol (VENTOLIN HFA) 108 (90 Base) MCG/ACT inhaler INHALE 2 PUFFS INTO THE LUNGS EVERY 4 HOURS AS NEEDED FOR WHEEZING OR SHORTNESS OF BREATH. 18 g 6  . aspirin EC 81 MG tablet Take 1 tablet (81 mg total) by mouth daily.    . busPIRone (BUSPAR) 15 MG tablet TAKE 1 TABLET (15 MG TOTAL) BY MOUTH 2 (TWO) TIMES DAILY. 60 tablet 1  . Cod Liver Oil 1000 MG CAPS Take 1 capsule by mouth daily.    . diphenhydrAMINE (BENADRYL) 25 MG tablet Take 25 mg by mouth every 6 (six) hours as needed.    Marland Kitchen FREESTYLE LITE test strip USE TO CHECK BLOOD SUGAR EVERY DAY AND AS NEEDED 100 strip 5  . hyoscyamine (LEVSIN SL) 0.125 MG SL tablet Place 1 tablet (0.125 mg total) under the tongue every 4 (four) hours as needed. 40 tablet 1  . LINZESS 145 MCG CAPS capsule TAKE 1 CAPSULE (145 MCG TOTAL) BY MOUTH DAILY BEFORE BREAKFAST. 90 capsule 5  . loratadine (CLARITIN) 10 MG tablet Take 10 mg by mouth daily.    . metFORMIN  (GLUCOPHAGE) 1000 MG tablet Take 0.5 tablets (500 mg total) by mouth daily with breakfast. 45 tablet 0  . modafinil (PROVIGIL) 200 MG  tablet TAKE 2 TABLETS (400 MG TOTAL) BY MOUTH DAILY. 90 tablet 1  . Prenatal Vit-Fe Fumarate-FA (M-VIT PO) Take by mouth.    . SYNTHROID 100 MCG tablet TAKE 1 TABLET (100 MCG TOTAL) BY MOUTH DAILY BEFORE BREAKFAST. 90 tablet 1  . venlafaxine XR (EFFEXOR-XR) 150 MG 24 hr capsule Take 1 capsule (150 mg total) by mouth daily with breakfast. 30 capsule 1  . fenofibrate micronized (LOFIBRA) 134 MG capsule TAKE 1 CAPSULE BY MOUTH DAILY BEFORE BREAKFAST 90 capsule 1  . furosemide (LASIX) 20 MG tablet TAKE 1 TO 2 TABLETS BY MOUTH DAILY AS NEEDED FOR FLUID OR EDEMA 40 tablet 1  . metoprolol succinate (TOPROL-XL) 100 MG 24 hr tablet TAKE 1 TABLET (100 MG TOTAL) BY MOUTH DAILY. TAKE WITH OR IMMEDIATELY FOLLOWING A MEAL. 30 tablet 1  . pregabalin (LYRICA) 150 MG capsule Take 1 capsule (150 mg total) by mouth 2 (two) times daily. 60 capsule 3  . spironolactone (ALDACTONE) 50 MG tablet Take 1 tablet (50 mg total) by mouth 3 (three) times daily. 90 tablet 3  . Vitamin D, Ergocalciferol, (DRISDOL) 1.25 MG (50000 UT) CAPS capsule Take 1 capsule (50,000 Units total) by mouth every 7 (seven) days. 4 capsule 4  . hydrochlorothiazide (HYDRODIURIL) 25 MG tablet Take 1 tablet (25 mg total) by mouth daily. 90 tablet 1   No facility-administered medications prior to visit.    Allergies  Allergen Reactions  . Losartan Cough  . Wellbutrin [Bupropion] Other (See Comments)    Dizziness and mental status changes  . Atorvastatin     myalgias  . Codeine   . Amoxicillin Rash  . Erythromycin Rash  . Penicillins Rash    Review of Systems  Constitutional: Positive for malaise/fatigue. Negative for fever.  HENT: Negative for congestion.   Eyes: Negative for blurred vision.  Respiratory: Negative for shortness of breath.   Cardiovascular: Negative for chest pain, palpitations and leg  swelling.  Gastrointestinal: Positive for constipation. Negative for abdominal pain, blood in stool and nausea.  Genitourinary: Negative for dysuria and frequency.  Musculoskeletal: Positive for falls and myalgias.  Skin: Negative for rash.  Neurological: Negative for dizziness, loss of consciousness and headaches.  Endo/Heme/Allergies: Negative for environmental allergies.  Psychiatric/Behavioral: Negative for depression. The patient is not nervous/anxious.        Objective:    Physical Exam Vitals and nursing note reviewed.  Constitutional:      General: She is not in acute distress.    Appearance: She is well-developed.  HENT:     Head: Normocephalic and atraumatic.     Nose: Nose normal.  Eyes:     General:        Right eye: No discharge.        Left eye: No discharge.  Cardiovascular:     Rate and Rhythm: Normal rate and regular rhythm.     Heart sounds: No murmur.  Pulmonary:     Effort: Pulmonary effort is normal.     Breath sounds: Normal breath sounds.  Abdominal:     General: Bowel sounds are normal.     Palpations: Abdomen is soft.     Tenderness: There is no abdominal tenderness.  Musculoskeletal:     Cervical back: Normal range of motion and neck supple.  Skin:    General: Skin is warm and dry.  Neurological:     Mental Status: She is alert and oriented to person, place, and time.     BP 110/70 (BP  Location: Left Arm, Cuff Size: Normal)   Pulse 80   Temp (!) 96.9 F (36.1 C) (Temporal)   Resp 12   Ht 5\' 5"  (1.651 m)   Wt 252 lb 12.8 oz (114.7 kg)   SpO2 98%   BMI 42.07 kg/m  Wt Readings from Last 3 Encounters:  08/26/19 252 lb 12.8 oz (114.7 kg)  07/03/19 241 lb (109.3 kg)  06/20/19 244 lb (110.7 kg)    Diabetic Foot Exam - Simple   No data filed     Lab Results  Component Value Date   WBC 4.1 07/29/2019   HGB 13.5 07/29/2019   HCT 40.0 07/29/2019   PLT 422.0 (H) 07/29/2019   GLUCOSE 103 (H) 07/29/2019   CHOL 236 (H) 07/29/2019    TRIG 133.0 07/29/2019   HDL 60.50 07/29/2019   LDLDIRECT 103.0 07/11/2016   LDLCALC 148 (H) 07/29/2019   ALT 20 07/29/2019   AST 19 07/29/2019   NA 137 07/29/2019   K 4.5 07/29/2019   CL 103 07/29/2019   CREATININE 0.98 07/29/2019   BUN 17 07/29/2019   CO2 27 07/29/2019   TSH 0.88 07/29/2019   HGBA1C 6.2 07/29/2019   MICROALBUR <0.7 08/19/2019    Lab Results  Component Value Date   TSH 0.88 07/29/2019   Lab Results  Component Value Date   WBC 4.1 07/29/2019   HGB 13.5 07/29/2019   HCT 40.0 07/29/2019   MCV 91.5 07/29/2019   PLT 422.0 (H) 07/29/2019   Lab Results  Component Value Date   NA 137 07/29/2019   K 4.5 07/29/2019   CO2 27 07/29/2019   GLUCOSE 103 (H) 07/29/2019   BUN 17 07/29/2019   CREATININE 0.98 07/29/2019   BILITOT 0.3 07/29/2019   ALKPHOS 47 07/29/2019   AST 19 07/29/2019   ALT 20 07/29/2019   PROT 6.9 07/29/2019   ALBUMIN 4.4 07/29/2019   CALCIUM 10.2 07/29/2019   ANIONGAP 10 04/04/2018   GFR 70.37 07/29/2019   Lab Results  Component Value Date   CHOL 236 (H) 07/29/2019   Lab Results  Component Value Date   HDL 60.50 07/29/2019   Lab Results  Component Value Date   LDLCALC 148 (H) 07/29/2019   Lab Results  Component Value Date   TRIG 133.0 07/29/2019   Lab Results  Component Value Date   CHOLHDL 4 07/29/2019   Lab Results  Component Value Date   HGBA1C 6.2 07/29/2019       Assessment & Plan:   Problem List Items Addressed This Visit    Hypothyroidism    On Levothyroxine, continue to monitor. Patient was hoping to have her dosing increased but herlabs do not warrant it.       Relevant Medications   metoprolol succinate (TOPROL-XL) 100 MG 24 hr tablet   Other Relevant Orders   T4, free (Completed)   T3, free (Completed)   Thyroid peroxidase antibody (Completed)   Hyperlipemia, mixed    Encouraged heart healthy diet, increase exercise, avoid trans fats, consider a krill oil cap daily      Relevant Medications    fenofibrate micronized (LOFIBRA) 134 MG capsule   furosemide (LASIX) 20 MG tablet   spironolactone (ALDACTONE) 50 MG tablet   metoprolol succinate (TOPROL-XL) 100 MG 24 hr tablet   Essential hypertension    Well controlled, no changes to meds. Encouraged heart healthy diet such as the DASH diet and exercise as tolerated.       Relevant Medications  fenofibrate micronized (LOFIBRA) 134 MG capsule   furosemide (LASIX) 20 MG tablet   spironolactone (ALDACTONE) 50 MG tablet   metoprolol succinate (TOPROL-XL) 100 MG 24 hr tablet   IBS (irritable bowel syndrome)    Constipation dominant. Increase Linzess to 290 as the lower dose has been ineffective.       Relevant Medications   linaclotide (LINZESS) 290 MCG CAPS capsule   magic mouthwash SOLN   Vitamin D deficiency - Primary    Supplement and monitor      Relevant Orders   VITAMIN D 25 Hydroxy (Vit-D Deficiency, Fractures) (Completed)   Diabetes mellitus type 2 in obese (HCC)    hgba1c acceptable, minimize simple carbs. Increase exercise as tolerated. Continue current meds      Myalgia   Relevant Orders   Sedimentation rate (Completed)   CK (Creatine Kinase) (Completed)   Right arm pain    She has been getting stem cell shots to her wrist and she is not sure they are helping. She has been noting increased fatigue and myalgias since starting them and her CK and Sed rate are increasing again. She is encouraged to stop the stem cell treatments and eat a low inflammation diet and report if worsens.          I have discontinued Nyana S. Cedillo's hydrochlorothiazide. I have also changed her Vitamin D (Ergocalciferol), fenofibrate micronized, and furosemide. Additionally, I am having her start on linaclotide. Lastly, I am having her maintain her albuterol, Cod Liver Oil, loratadine, acetaminophen, diphenhydrAMINE, hyoscyamine, aspirin EC, Synthroid, Linzess, metFORMIN, FREESTYLE LITE, Prenatal Vit-Fe Fumarate-FA (M-VIT PO),  venlafaxine XR, modafinil, busPIRone, spironolactone, metoprolol succinate, pregabalin, and magic mouthwash.  Meds ordered this encounter  Medications  . Vitamin D, Ergocalciferol, (DRISDOL) 1.25 MG (50000 UNIT) CAPS capsule    Sig: Take 1 capsule (50,000 Units total) by mouth every 7 (seven) days.    Dispense:  4 capsule    Refill:  4  . fenofibrate micronized (LOFIBRA) 134 MG capsule    Sig: Take 1 capsule (134 mg total) by mouth daily before breakfast.    Dispense:  90 capsule    Refill:  3  . furosemide (LASIX) 20 MG tablet    Sig: Take 1 tablet (20 mg total) by mouth daily as needed.    Dispense:  90 tablet    Refill:  1  . spironolactone (ALDACTONE) 50 MG tablet    Sig: Take 1 tablet (50 mg total) by mouth 3 (three) times daily.    Dispense:  270 tablet    Refill:  3    Discontinue previous prescriptions  . metoprolol succinate (TOPROL-XL) 100 MG 24 hr tablet    Sig: Take 1 tablet (100 mg total) by mouth daily. Take with or immediately following a meal.    Dispense:  90 tablet    Refill:  3  . pregabalin (LYRICA) 150 MG capsule    Sig: Take 1 capsule (150 mg total) by mouth 2 (two) times daily.    Dispense:  180 capsule    Refill:  1  . linaclotide (LINZESS) 290 MCG CAPS capsule    Sig: Take 1 capsule (290 mcg total) by mouth daily before breakfast.    Dispense:  90 capsule    Refill:  1  . DISCONTD: magic mouthwash SOLN    Sig: Take 5 mLs by mouth 4 (four) times daily as needed for mouth pain. Swish and spit    Dispense:  240 mL  Refill:  1    ([facility]) Diphenhydramine (12.31ml/5ml) 243ml Nystatin (100,000 units/23ml), 55ml Hydrocortisone 60mg  tablet  . magic mouthwash SOLN    Sig: Take 5 mLs by mouth 4 (four) times daily as needed for mouth pain. Swish and spit    Dispense:  240 mL    Refill:  1    ([facility]) Diphenhydramine (12.57ml/5ml) 230ml Nystatin (100,000 units/73ml), 43ml Hydrocortisone 60mg  tablet     Penni Homans, MD

## 2019-08-27 NOTE — Assessment & Plan Note (Signed)
Supplement and monitor 

## 2019-08-27 NOTE — Assessment & Plan Note (Signed)
On Levothyroxine, continue to monitor. Patient was hoping to have her dosing increased but herlabs do not warrant it.

## 2019-08-27 NOTE — Assessment & Plan Note (Signed)
Well controlled, no changes to meds. Encouraged heart healthy diet such as the DASH diet and exercise as tolerated.  °

## 2019-08-27 NOTE — Assessment & Plan Note (Signed)
She has been getting stem cell shots to her wrist and she is not sure they are helping. She has been noting increased fatigue and myalgias since starting them and her CK and Sed rate are increasing again. She is encouraged to stop the stem cell treatments and eat a low inflammation diet and report if worsens.

## 2019-08-27 NOTE — Assessment & Plan Note (Signed)
Constipation dominant. Increase Linzess to 290 as the lower dose has been ineffective.

## 2019-08-27 NOTE — Assessment & Plan Note (Signed)
Encouraged heart healthy diet, increase exercise, avoid trans fats, consider a krill oil cap daily 

## 2019-08-27 NOTE — Assessment & Plan Note (Signed)
hgba1c acceptable, minimize simple carbs. Increase exercise as tolerated. Continue current meds 

## 2019-09-04 ENCOUNTER — Other Ambulatory Visit: Payer: Self-pay | Admitting: Family Medicine

## 2019-09-04 ENCOUNTER — Encounter: Payer: Self-pay | Admitting: Family Medicine

## 2019-09-04 MED ORDER — METHYLPREDNISOLONE 4 MG PO TABS: ORAL_TABLET | ORAL | 1 refills | Status: DC

## 2019-09-05 ENCOUNTER — Ambulatory Visit (INDEPENDENT_AMBULATORY_CARE_PROVIDER_SITE_OTHER): Payer: 59 | Admitting: Psychology

## 2019-09-05 DIAGNOSIS — F411 Generalized anxiety disorder: Secondary | ICD-10-CM

## 2019-09-08 ENCOUNTER — Encounter: Payer: Self-pay | Admitting: Family Medicine

## 2019-09-09 ENCOUNTER — Encounter: Payer: Self-pay | Admitting: Family Medicine

## 2019-09-09 ENCOUNTER — Other Ambulatory Visit (INDEPENDENT_AMBULATORY_CARE_PROVIDER_SITE_OTHER): Payer: 59

## 2019-09-09 ENCOUNTER — Other Ambulatory Visit: Payer: Self-pay

## 2019-09-09 DIAGNOSIS — R700 Elevated erythrocyte sedimentation rate: Secondary | ICD-10-CM

## 2019-09-09 DIAGNOSIS — R748 Abnormal levels of other serum enzymes: Secondary | ICD-10-CM | POA: Diagnosis not present

## 2019-09-09 LAB — CK: Total CK: 225 U/L — ABNORMAL HIGH (ref 7–177)

## 2019-09-09 LAB — SEDIMENTATION RATE: Sed Rate: 9 mm/hr (ref 0–30)

## 2019-09-10 ENCOUNTER — Other Ambulatory Visit: Payer: Self-pay | Admitting: Family Medicine

## 2019-09-10 DIAGNOSIS — M791 Myalgia, unspecified site: Secondary | ICD-10-CM

## 2019-09-10 DIAGNOSIS — R748 Abnormal levels of other serum enzymes: Secondary | ICD-10-CM

## 2019-09-10 DIAGNOSIS — M2550 Pain in unspecified joint: Secondary | ICD-10-CM

## 2019-09-11 ENCOUNTER — Other Ambulatory Visit: Payer: Self-pay | Admitting: Family Medicine

## 2019-09-11 MED FILL — VENLAFAXINE HCL ER 150 MG C: 150 | 30 days supply | Qty: 30 | Fill #0

## 2019-09-11 MED FILL — METHYLPREDNISOLONE 4 MG TAB: 4 | 5 days supply | Qty: 15 | Fill #1

## 2019-09-16 ENCOUNTER — Other Ambulatory Visit: Payer: Self-pay

## 2019-09-16 ENCOUNTER — Ambulatory Visit
Admission: RE | Admit: 2019-09-16 | Discharge: 2019-09-16 | Disposition: A | Discharge status: Home / Self Care | Payer: 59 | Source: Ambulatory Visit | Attending: Family Medicine | Admitting: Family Medicine

## 2019-09-16 DIAGNOSIS — E2839 Other primary ovarian failure: Secondary | ICD-10-CM

## 2019-09-16 DIAGNOSIS — Z1231 Encounter for screening mammogram for malignant neoplasm of breast: Secondary | ICD-10-CM | POA: Diagnosis not present

## 2019-09-16 DIAGNOSIS — Z13820 Encounter for screening for osteoporosis: Secondary | ICD-10-CM | POA: Diagnosis not present

## 2019-09-16 DIAGNOSIS — Z780 Asymptomatic menopausal state: Secondary | ICD-10-CM | POA: Diagnosis not present

## 2019-09-16 DIAGNOSIS — Z1239 Encounter for other screening for malignant neoplasm of breast: Secondary | ICD-10-CM

## 2019-09-16 MED FILL — METOPROLOL SUCCINATE ER 100: 100 | 90 days supply | Qty: 90 | Fill #0

## 2019-09-16 MED FILL — MODAFINIL 200 MG TABLET: 200 | 45 days supply | Qty: 90 | Fill #1

## 2019-09-16 MED FILL — busPIRone HCL 15 MG TABS: 15 | 30 days supply | Qty: 60 | Fill #1

## 2019-09-17 ENCOUNTER — Encounter: Payer: Self-pay | Admitting: Cardiology

## 2019-09-23 ENCOUNTER — Other Ambulatory Visit: Payer: Self-pay | Admitting: *Deleted

## 2019-09-23 DIAGNOSIS — Z87891 Personal history of nicotine dependence: Secondary | ICD-10-CM

## 2019-09-26 ENCOUNTER — Ambulatory Visit (INDEPENDENT_AMBULATORY_CARE_PROVIDER_SITE_OTHER): Payer: 59 | Admitting: Psychology

## 2019-09-26 DIAGNOSIS — F411 Generalized anxiety disorder: Secondary | ICD-10-CM | POA: Diagnosis not present

## 2019-10-02 DIAGNOSIS — M545 Low back pain: Secondary | ICD-10-CM | POA: Diagnosis not present

## 2019-10-02 DIAGNOSIS — R5383 Other fatigue: Secondary | ICD-10-CM | POA: Diagnosis not present

## 2019-10-02 DIAGNOSIS — M791 Myalgia, unspecified site: Secondary | ICD-10-CM | POA: Diagnosis not present

## 2019-10-02 DIAGNOSIS — Z6841 Body Mass Index (BMI) 40.0 and over, adult: Secondary | ICD-10-CM | POA: Diagnosis not present

## 2019-10-02 DIAGNOSIS — R748 Abnormal levels of other serum enzymes: Secondary | ICD-10-CM | POA: Diagnosis not present

## 2019-10-02 DIAGNOSIS — M797 Fibromyalgia: Secondary | ICD-10-CM | POA: Diagnosis not present

## 2019-10-10 ENCOUNTER — Other Ambulatory Visit: Payer: Self-pay | Admitting: Family Medicine

## 2019-10-10 MED FILL — VENLAFAXINE HCL ER 150 MG C: 150 | 30 days supply | Qty: 30 | Fill #1

## 2019-10-10 MED FILL — VIT D2 1.25 MG (50,000 UNIT: 1.25 MG | 55 days supply | Qty: 8 | Fill #1

## 2019-10-14 ENCOUNTER — Other Ambulatory Visit: Payer: Self-pay | Admitting: Family Medicine

## 2019-10-14 MED FILL — busPIRone HCL 15 MG TABS: 15 | 30 days supply | Qty: 60 | Fill #0

## 2019-10-20 MED FILL — SYNTHROID 100 MCG TABLET: 100 | 90 days supply | Qty: 90 | Fill #0

## 2019-10-22 ENCOUNTER — Ambulatory Visit (INDEPENDENT_AMBULATORY_CARE_PROVIDER_SITE_OTHER): Payer: 59 | Admitting: Acute Care

## 2019-10-22 ENCOUNTER — Encounter: Payer: Self-pay | Admitting: Acute Care

## 2019-10-22 ENCOUNTER — Ambulatory Visit
Admission: RE | Admit: 2019-10-22 | Discharge: 2019-10-22 | Disposition: A | Discharge status: Home / Self Care | Payer: 59 | Source: Ambulatory Visit | Attending: Acute Care | Admitting: Acute Care

## 2019-10-22 ENCOUNTER — Other Ambulatory Visit: Payer: Self-pay

## 2019-10-22 VITALS — BP 122/74 | HR 83 | Temp 97.2°F | Ht 64.5 in | Wt 260.0 lb

## 2019-10-22 DIAGNOSIS — Z122 Encounter for screening for malignant neoplasm of respiratory organs: Secondary | ICD-10-CM

## 2019-10-22 DIAGNOSIS — Z87891 Personal history of nicotine dependence: Secondary | ICD-10-CM

## 2019-10-22 NOTE — Progress Notes (Signed)
.  shared

## 2019-10-22 NOTE — Progress Notes (Signed)
Shared Decision Making Visit Lung Cancer Screening Program [phone])   Eligibility:  Age 59 y.o.  Pack Years Smoking History Calculation 31.5 pack year (# packs/per year x # years smoked)  Recent History of coughing up blood  no  Unexplained weight loss? no ( >Than 15 pounds within the last 6 months )  Prior History Lung / other cancer no (Diagnosis within the last 5 years already requiring surveillance chest CT Scans).  Smoking Status Former Smoker  Former Smokers: Years since quit: 14 years  Quit Date: 2007  Visit Components:  Discussion included one or more decision making aids. yes  Discussion included risk/benefits of screening. yes  Discussion included potential follow up diagnostic testing for abnormal scans. yes  Discussion included meaning and risk of over diagnosis. yes  Discussion included meaning and risk of False Positives. yes  Discussion included meaning of total radiation exposure. yes  Counseling Included:  Importance of adherence to annual lung cancer LDCT screening. yes  Impact of comorbidities on ability to participate in the program. yes  Ability and willingness to under diagnostic treatment. yes  Smoking Cessation Counseling:  Current Smokers:   Discussed importance of smoking cessation. yes  Information about tobacco cessation classes and interventions provided to patient. yes  Patient provided with "ticket" for LDCT Scan. yes  Symptomatic Patient. no  Counseling NA  Diagnosis Code: Tobacco Use Z72.0  Asymptomatic Patient yes  Counseling (Intermediate counseling: > three minutes counseling) J8250  Former Smokers:   Discussed the importance of maintaining cigarette abstinence. yes  Diagnosis Code: Personal History of Nicotine Dependence. N39.767  Information about tobacco cessation classes and interventions provided to patient. Yes  Patient provided with "ticket" for LDCT Scan. yes  Written Order for Lung Cancer Screening  with LDCT placed in Epic. Yes (CT Chest Lung Cancer Screening Low Dose W/O CM) [id number] Z12.2-Screening of respiratory organs Z87.891-Personal history of nicotine dependence  I spent 25 minutes of face to face time with [name] discussing the risks and benefits of lung cancer screening. We viewed a power point together that explained in detail the above noted topics. We took the time to pause the power point at intervals to allow for questions to be asked and answered to ensure understanding. We discussed that she had taken the single most powerful action possible to decrease her risk of developing lung cancer when she quit smoking. I counseled her to remain smoke free, and to contact me if she ever had the desire to smoke again so that I can provide resources and tools to help support the effort to remain smoke free. We discussed the time and location of the scan, and that either  Doroteo Glassman RN or I will call with the results within  24-48 hours of receiving them. She has my card and contact information in the event she needs to speak with me, in addition to a copy of the power point we reviewed as a resource. She verbalized understanding of all of the above and had no further questions upon leaving the office.     I explained to the patient that there has been a high incidence of coronary artery disease noted on these exams. I explained that this is a non-gated exam therefore degree or severity cannot be determined. This patient is  not on statin therapy. I have asked the patient to follow-up with their PCP regarding any incidental finding of coronary artery disease and management with diet or medication as they feel is clinically  indicated. The patient verbalized understanding of the above and had no further questions.     Gina Spatz, NP 10/22/2019 9:59 AM

## 2019-10-22 NOTE — Patient Instructions (Addendum)
Thank you for participating in the [place] [facility] Scre[facility]. It was our pleasure to meet you today. We will call you with the results of your scan within the next few days. Your scan will be assigned a Lung RADS category score by the physicians reading the scans.  This Lung RADS score determines follow up scanning.  See below for description of categories, and follow up screening recommendations. We will be in touch to schedule your follow up screening annually or based on recommendations of our providers. We will fax a copy of your scan results to your Primary Care Physician, or the physician who referred you to the program, to ensure they have the results. Please call the office if you have any questions or concerns regarding your scanning experience or results.  Our office number is [phone]. Please speak with Denise Phelps, RN. She is our Lung Cancer Screening RN. If she is unavailable when you call, please have the office staff send her a message. She will return your call at her earliest convenience. Remember, if your scan is normal, we will scan you annually as long as you continue to meet the criteria for the program. (Age 55-77, Current smoker or smoker who has quit within the last 15 years). If you are a smoker, remember, quitting is the single most powerful action that you can take to decrease your risk of lung cancer and other pulmonary, breathing related problems. We know quitting is hard, and we are here to help.  Please let us know if there is anything we can do to help you meet your goal of quitting. If you are a former smoker, congratulations. We are proud of you! Remain smoke free! Remember you can refer friends or family members through the number above.  We will screen them to make sure they meet criteria for the program. Thank you for helping us take better care of you by participating in Lung Screening.  Lung RADS Categories:  Lung RADS 1: no nodules  or definitely non-concerning nodules.  Recommendation is for a repeat annual scan in 12 months.  Lung RADS 2:  nodules that are non-concerning in appearance and behavior with a very low likelihood of becoming an active cancer. Recommendation is for a repeat annual scan in 12 months.  Lung RADS 3: nodules that are probably non-concerning , includes nodules with a low likelihood of becoming an active cancer.  Recommendation is for a 6-month repeat screening scan. Often noted after an upper respiratory illness. We will be in touch to make sure you have no questions, and to schedule your 6-month scan.  Lung RADS 4 A: nodules with concerning findings, recommendation is most often for a follow up scan in 3 months or additional testing based on our provider's assessment of the scan. We will be in touch to make sure you have no questions and to schedule the recommended 3 month follow up scan.  Lung RADS 4 B:  indicates findings that are concerning. We will be in touch with you to schedule additional diagnostic testing based on our provider's  assessment of the scan.   

## 2019-10-23 NOTE — Progress Notes (Signed)
Please call patient and let them  know their  low dose Ct was read as a Lung RADS 2: nodules that are benign in appearance and behavior with a very low likelihood of becoming a clinically active cancer due to size or lack of growth. Recommendation per radiology is for a repeat LDCT in 12 months. .Please let them  know we will order and schedule their  annual screening scan for 09/2020. Marland Kitchen Please place order for annual  screening scan for  09/2020 and fax results to PCP. Thanks so much.

## 2019-10-24 ENCOUNTER — Ambulatory Visit (INDEPENDENT_AMBULATORY_CARE_PROVIDER_SITE_OTHER): Payer: 59 | Admitting: Psychology

## 2019-10-24 DIAGNOSIS — F411 Generalized anxiety disorder: Secondary | ICD-10-CM

## 2019-10-27 ENCOUNTER — Encounter: Payer: Self-pay | Admitting: Family Medicine

## 2019-10-27 ENCOUNTER — Other Ambulatory Visit: Payer: Self-pay | Admitting: Family Medicine

## 2019-10-28 ENCOUNTER — Other Ambulatory Visit: Payer: Self-pay | Admitting: *Deleted

## 2019-10-28 DIAGNOSIS — Z87891 Personal history of nicotine dependence: Secondary | ICD-10-CM

## 2019-10-28 MED FILL — ALBUTEROL SULFATE HFA 108 (: 108 (90 BAS | 17 days supply | Qty: 18 | Fill #0

## 2019-11-04 DIAGNOSIS — R200 Anesthesia of skin: Secondary | ICD-10-CM | POA: Diagnosis not present

## 2019-11-04 DIAGNOSIS — R0602 Shortness of breath: Secondary | ICD-10-CM | POA: Diagnosis not present

## 2019-11-04 DIAGNOSIS — R531 Weakness: Secondary | ICD-10-CM | POA: Diagnosis not present

## 2019-11-04 DIAGNOSIS — M542 Cervicalgia: Secondary | ICD-10-CM | POA: Insufficient documentation

## 2019-11-05 MED FILL — IPRATROPIUM 0.06% SPRAY: 0.06 | 14 days supply | Qty: 15 | Fill #0

## 2019-11-10 ENCOUNTER — Encounter (INDEPENDENT_AMBULATORY_CARE_PROVIDER_SITE_OTHER): Payer: Self-pay

## 2019-11-11 ENCOUNTER — Other Ambulatory Visit: Payer: Self-pay | Admitting: Family Medicine

## 2019-11-11 ENCOUNTER — Ambulatory Visit: Payer: 59 | Admitting: Family Medicine

## 2019-11-11 ENCOUNTER — Other Ambulatory Visit: Payer: Self-pay

## 2019-11-11 VITALS — BP 126/80 | HR 79 | Temp 98.3°F | Resp 16 | Ht 65.0 in | Wt 256.2 lb

## 2019-11-11 DIAGNOSIS — E039 Hypothyroidism, unspecified: Secondary | ICD-10-CM | POA: Diagnosis not present

## 2019-11-11 DIAGNOSIS — E1169 Type 2 diabetes mellitus with other specified complication: Secondary | ICD-10-CM | POA: Diagnosis not present

## 2019-11-11 DIAGNOSIS — F4323 Adjustment disorder with mixed anxiety and depressed mood: Secondary | ICD-10-CM | POA: Diagnosis not present

## 2019-11-11 DIAGNOSIS — I10 Essential (primary) hypertension: Secondary | ICD-10-CM | POA: Diagnosis not present

## 2019-11-11 DIAGNOSIS — R748 Abnormal levels of other serum enzymes: Secondary | ICD-10-CM | POA: Diagnosis not present

## 2019-11-11 DIAGNOSIS — G4733 Obstructive sleep apnea (adult) (pediatric): Secondary | ICD-10-CM

## 2019-11-11 DIAGNOSIS — E669 Obesity, unspecified: Secondary | ICD-10-CM

## 2019-11-11 DIAGNOSIS — M5441 Lumbago with sciatica, right side: Secondary | ICD-10-CM

## 2019-11-11 DIAGNOSIS — G894 Chronic pain syndrome: Secondary | ICD-10-CM

## 2019-11-11 DIAGNOSIS — F418 Other specified anxiety disorders: Secondary | ICD-10-CM

## 2019-11-11 DIAGNOSIS — R591 Generalized enlarged lymph nodes: Secondary | ICD-10-CM

## 2019-11-11 DIAGNOSIS — E782 Mixed hyperlipidemia: Secondary | ICD-10-CM

## 2019-11-11 DIAGNOSIS — E559 Vitamin D deficiency, unspecified: Secondary | ICD-10-CM | POA: Diagnosis not present

## 2019-11-11 LAB — CBC
HCT: 42.3 % (ref 36.0–46.0)
Hemoglobin: 14.3 g/dL (ref 12.0–15.0)
MCHC: 33.8 g/dL (ref 30.0–36.0)
MCV: 90.6 fl (ref 78.0–100.0)
Platelets: 365 10*3/uL (ref 150.0–400.0)
RBC: 4.67 Mil/uL (ref 3.87–5.11)
RDW: 14.4 % (ref 11.5–15.5)
WBC: 3.9 10*3/uL — ABNORMAL LOW (ref 4.0–10.5)

## 2019-11-11 LAB — COMPREHENSIVE METABOLIC PANEL
ALT: 19 U/L (ref 0–35)
AST: 18 U/L (ref 0–37)
Albumin: 4.5 g/dL (ref 3.5–5.2)
Alkaline Phosphatase: 60 U/L (ref 39–117)
BUN: 10 mg/dL (ref 6–23)
CO2: 27 mEq/L (ref 19–32)
Calcium: 10.3 mg/dL (ref 8.4–10.5)
Chloride: 104 mEq/L (ref 96–112)
Creatinine, Ser: 0.91 mg/dL (ref 0.40–1.20)
GFR: 76.57 mL/min (ref >60.00)
Glucose, Bld: 79 mg/dL (ref 70–99)
Potassium: 4.5 mEq/L (ref 3.5–5.1)
Sodium: 139 mEq/L (ref 135–145)
Total Bilirubin: 0.3 mg/dL (ref 0.2–1.2)
Total Protein: 7.2 g/dL (ref 6.0–8.3)

## 2019-11-11 LAB — CK: Total CK: 241 U/L — ABNORMAL HIGH (ref 7–177)

## 2019-11-11 LAB — LIPID PANEL
Cholesterol: 234 mg/dL — ABNORMAL HIGH (ref 0–200)
HDL: 45.1 mg/dL (ref >39.00)
NonHDL: 189.06
Total CHOL/HDL Ratio: 5
Triglycerides: 216 mg/dL — ABNORMAL HIGH (ref 0.0–149.0)
VLDL: 43.2 mg/dL — ABNORMAL HIGH (ref 0.0–40.0)

## 2019-11-11 LAB — HEMOGLOBIN A1C: Hgb A1c MFr Bld: 6.5 % (ref 4.6–6.5)

## 2019-11-11 LAB — TSH: TSH: 1.3 u[IU]/mL (ref 0.35–4.50)

## 2019-11-11 LAB — LDL CHOLESTEROL, DIRECT: Direct LDL: 159 mg/dL

## 2019-11-11 MED ORDER — GABAPENTIN 100 MG PO CAPS: 100.0000 mg | ORAL_CAPSULE | Freq: Three times a day (TID) | ORAL | 1 refills | Status: DC

## 2019-11-11 MED ORDER — GABAPENTIN 300 MG PO CAPS: 300.0000 mg | ORAL_CAPSULE | Freq: Every day | ORAL | 1 refills | Status: DC

## 2019-11-11 MED ORDER — DULOXETINE HCL 30 MG PO CPEP
30.0000 mg | ORAL_CAPSULE | Freq: Every day | ORAL | 3 refills | Status: DC
Start: 2019-11-11 — End: 2019-12-26

## 2019-11-11 MED FILL — GABAPENTIN 300 MG CAPSULE: 300 | 90 days supply | Qty: 180 | Fill #0

## 2019-11-11 MED FILL — DULoxetine HCL 30 MG CPEP: 30 | 30 days supply | Qty: 30 | Fill #0

## 2019-11-11 MED FILL — GABAPENTIN 100 MG CAPSULE: 100 | 60 days supply | Qty: 180 | Fill #0

## 2019-11-11 NOTE — Assessment & Plan Note (Addendum)
Worsening now following with rheumatology and neurology. Neurology stopped her statin  due to her elevated CK. Will recheck levels today. Wants to restart Gabapentin, had tried Lyrica but would like to switch back to Gabapentin.

## 2019-11-11 NOTE — Assessment & Plan Note (Signed)
Encouraged heart healthy diet, increase exercise, avoid trans fats, consider a krill oil cap daily. Had to stop her statin due to elevated CK about a week ago will recheck ck today

## 2019-11-11 NOTE — Assessment & Plan Note (Signed)
Well controlled, no changes to meds. Encouraged heart healthy diet such as the DASH diet and exercise as tolerated.  °

## 2019-11-11 NOTE — Patient Instructions (Signed)

## 2019-11-12 ENCOUNTER — Encounter: Payer: Self-pay | Admitting: Family Medicine

## 2019-11-12 ENCOUNTER — Other Ambulatory Visit: Payer: Self-pay | Admitting: *Deleted

## 2019-11-12 DIAGNOSIS — R748 Abnormal levels of other serum enzymes: Secondary | ICD-10-CM

## 2019-11-12 DIAGNOSIS — R591 Generalized enlarged lymph nodes: Secondary | ICD-10-CM | POA: Insufficient documentation

## 2019-11-12 NOTE — Assessment & Plan Note (Signed)
Right occiput just noted. She will report if it is persistent

## 2019-11-12 NOTE — Assessment & Plan Note (Addendum)
She has had an epidural injection which was only partially helpful and has suffered a fall since then which flared the pain again. Will restart Gabapentin as she feels that helped her in the past.

## 2019-11-12 NOTE — Assessment & Plan Note (Addendum)
Struggling with worsening depression. Referred to psychiatry. She will continue to work with behavioral health [facility]. Started on Duloxetine 30 mg po daily

## 2019-11-12 NOTE — Assessment & Plan Note (Signed)
She is following with rheumatology but no cause has been discovered yet. She has been off of her statin this week and her CK is still up repeat check in 4 weeks and stay off of the statin for now.

## 2019-11-12 NOTE — Progress Notes (Signed)
Subjective:    Patient ID: [name], female    DOB: [birth year], 59 y.o.   MRN: [id number]  Chief Complaint  Patient presents with  . Follow-up  . would like to go back to gabapentin.  did no like Lyrica  . would like to increase the provigil to 500mg     HPI Patient is in today for follow up on chronic medical concerns. No recent febrile illness or hospitalizations. She continues to struggle with back pain, myalgias and joint pains. She is hoping to restart Gabapentin to manage her pain. No polyuria or polydipsia. Denies CP/palp/SOB/HA/congestion/fevers/GI or GU c/o. Taking meds as prescribed  Past Medical History:  Diagnosis Date  . Acute bronchitis 05/25/2016  . Anxiety   . Chronic pain syndrome 01/06/2013  . Chronic rhinosinusitis   . Colon polyps   . Cough 01/06/2013  . Depression   . Diabetes ([place]) 01/06/2013  . Diabetes mellitus type 2 in obese [facility]) 01/06/2013   Sees Dr Calvert Cantor for eye exam Does not see podiatry, foot exam today unremarkable except for thick cracking skin on heals   . Dysuria 05/25/2016  . Edema 01/06/2013  . Epistaxis 05/25/2016  . Fibroid, uterine   . GERD (gastroesophageal reflux disease)   . Glaucoma 12-13  . Hoarseness   . Hoarseness of voice 05/11/2013  . Hyperglycemia 04/13/2013  . Hyperlipemia, mixed 06/06/2007   Qualifier: Diagnosis of  By: Wynona Luna She feels Lipitor caused increased low back pain and weakness    . Hyperlipidemia   . Hypertension   . Hypokalemia 02/05/2013  . IBS (irritable bowel syndrome) 02/13/2011  . Low back pain 03/03/2013  . Muscle cramp 05/22/2014  . Obesity   . Obesity, unspecified 05/11/2013  . OSA (obstructive sleep apnea)   . Pain in joint, shoulder region 06/27/2015  . Perimenopause 10/05/2012  . Raynaud disease 06/16/2013  . Thyroid disease    hypothyroidism  . Vocal fold nodules     Past Surgical History:  Procedure Laterality Date  . ABDOMINAL HYSTERECTOMY    . BREAST EXCISIONAL BIOPSY Right      at age 86  . CHOLECYSTECTOMY    . CYSTECTOMY    . DILATION AND CURETTAGE OF UTERUS     x2  . I & D EXTREMITY Left 04/11/2018   Procedure: DEBIDMENT DISTAL INTERPHALANGEAL LEFT MIDDLE;  Surgeon: Daryll Brod, MD;  Location: [facility];  Service: Orthopedics;  Laterality: Left;  Marland Kitchen MASS EXCISION Left 04/11/2018   Procedure: EXCISION MASS;  Surgeon: Daryll Brod, MD;  Location: [place];  Service: Orthopedics;  Laterality: Left;  . OOPHORECTOMY    . POLYPECTOMY    . ROTATOR CUFF REPAIR      Family History  Problem Relation Age of Onset  . Alcohol abuse Father   . Cancer Father        renal and colon  . Hyperlipidemia Father   . Hypertension Father   . Stroke Father   . Heart disease Father   . Cancer Paternal Grandfather        colon  . Diabetes Other   . High blood pressure Mother   . High Cholesterol Mother   . Kidney disease Mother   . Depression Mother   . Anxiety disorder Mother   . Obesity Mother   . Breast cancer Other 45  . Diabetes Sister   . Diabetes Brother     Social History   Socioeconomic History  .  Marital status: Significant Other    Spouse name: Not on file  . Number of children: 0  . Years of education: Not on file  . Highest education level: Not on file  Occupational History  . 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 Smoking status: Former Smoker    Packs/day: 0.75    Years: 42.00    Pack years: 31.50    Types: Cigarettes    Quit date: 04/24/2005    Years since quitting: 14.5  . Smokeless tobacco: Never Used  . Tobacco comment: smoked since age 18  Vaping Use  . Vaping Use: Never used  Substance and Sexual Activity  . Alcohol use: No  . Drug use: Never  . Sexual activity: Yes    Partners: Male  Other Topics Concern  . Not on file  Social History Narrative   Endo-- Dr Dwyane Dee   ENT--Dr shoemaker   GI--Dr Buccini   Pulm--Dr Clance   Rheum--Dr Charlestine Night         Social Determinants of Health    Financial Resource Strain:   . Difficulty of Paying Living Expenses:   Food Insecurity:   . Worried About Charity fundraiser in the Last Year:   . Arboriculturist in the Last Year:   Transportation Needs:   . Film/video editor (Medical):   Marland Kitchen Lack of Transportation (Non-Medical):   Physical Activity:   . Days of Exercise per Week:   . Minutes of Exercise per Session:   Stress:   . Feeling of Stress :   Social Connections:   . Frequency of Communication with Friends and Family:   . Frequency of Social Gatherings with Friends and Family:   . Attends Religious Services:   . Active Member of Clubs or Organizations:   . Attends Archivist Meetings:   Marland Kitchen Marital Status:   Intimate Partner Violence:   . Fear of Current or Ex-Partner:   . Emotionally Abused:   Marland Kitchen Physically Abused:   . Sexually Abused:     Outpatient Medications Prior to Visit  Medication Sig Dispense Refill  . acetaminophen (TYLENOL) 325 MG tablet Take 650 mg by mouth every 6 (six) hours as needed.    Marland Kitchen albuterol (VENTOLIN HFA) 108 (90 Base) MCG/ACT inhaler Inhale 2 puffs into the lungs every 4 (four) hours as needed for wheezing or shortness of breath. 18 g 5  . aspirin EC 81 MG tablet Take 1 tablet (81 mg total) by mouth daily.    Marland Kitchen Cod Liver Oil 1000 MG CAPS Take 1 capsule by mouth daily.    . diphenhydrAMINE (BENADRYL) 25 MG tablet Take 25 mg by mouth every 6 (six) hours as needed.    Marland Kitchen FREESTYLE LITE test strip USE TO CHECK BLOOD SUGAR EVERY DAY AND AS NEEDED 100 strip 5  . furosemide (LASIX) 20 MG tablet Take 1 tablet (20 mg total) by mouth daily as needed. 90 tablet 1  . hyoscyamine (LEVSIN SL) 0.125 MG SL tablet Place 1 tablet (0.125 mg total) under the tongue every 4 (four) hours as needed. 40 tablet 1  . linaclotide (LINZESS) 290 MCG CAPS capsule Take 1 capsule (290 mcg total) by mouth daily before breakfast. 90 capsule 1  . loratadine (CLARITIN) 10 MG tablet Take 10 mg by mouth daily.    .  magic mouthwash SOLN Take 5 mLs by mouth 4 (four) times daily as needed for mouth pain. Swish and spit 240 mL 1  .  metFORMIN (GLUCOPHAGE) 1000 MG tablet Take 0.5 tablets (500 mg total) by mouth daily with breakfast. 45 tablet 0  . metoprolol succinate (TOPROL-XL) 100 MG 24 hr tablet Take 1 tablet (100 mg total) by mouth daily. Take with or immediately following a meal. 90 tablet 3  . modafinil (PROVIGIL) 200 MG tablet TAKE 2 TABLETS (400 MG TOTAL) BY MOUTH DAILY. 90 tablet 1  . Prenatal Vit-Fe Fumarate-FA (M-VIT PO) Take by mouth.    . spironolactone (ALDACTONE) 50 MG tablet Take 1 tablet (50 mg total) by mouth 3 (three) times daily. 270 tablet 3  . SYNTHROID 100 MCG tablet TAKE 1 TABLET (100 MCG TOTAL) BY MOUTH DAILY BEFORE BREAKFAST. 90 tablet 1  . Vitamin D, Ergocalciferol, (DRISDOL) 1.25 MG (50000 UNIT) CAPS capsule Take 1 capsule (50,000 Units total) by mouth every 7 (seven) days. 4 capsule 4  . busPIRone (BUSPAR) 15 MG tablet TAKE 1 TABLET (15 MG TOTAL) BY MOUTH 2 (TWO) TIMES DAILY. 60 tablet 0  . fenofibrate micronized (LOFIBRA) 134 MG capsule Take 1 capsule (134 mg total) by mouth daily before breakfast. 90 capsule 3  . pregabalin (LYRICA) 150 MG capsule Take 1 capsule (150 mg total) by mouth 2 (two) times daily. 180 capsule 1  . venlafaxine XR (EFFEXOR-XR) 150 MG 24 hr capsule TAKE 1 CAPSULE (150 MG TOTAL) BY MOUTH DAILY WITH BREAKFAST. 30 capsule 1   No facility-administered medications prior to visit.    Allergies  Allergen Reactions  . Losartan Cough  . Wellbutrin [Bupropion] Other (See Comments)    Dizziness and mental status changes  . Atorvastatin     myalgias  . Codeine   . Amoxicillin Rash  . Erythromycin Rash  . Penicillins Rash    Review of Systems  Constitutional: Positive for malaise/fatigue. Negative for fever.  HENT: Negative for congestion.   Eyes: Negative for blurred vision.  Respiratory: Negative for shortness of breath.   Cardiovascular: Negative for  chest pain, palpitations and leg swelling.  Gastrointestinal: Negative for abdominal pain, blood in stool and nausea.  Genitourinary: Negative for dysuria and frequency.  Musculoskeletal: Positive for back pain, myalgias and neck pain. Negative for falls.  Skin: Negative for rash.  Neurological: Negative for dizziness, loss of consciousness and headaches.  Endo/Heme/Allergies: Negative for environmental allergies.  Psychiatric/Behavioral: Positive for depression. The patient is nervous/anxious.        Objective:    Physical Exam  BP 126/80 (BP Location: Left Arm, Cuff Size: Large)   Pulse 79   Temp 98.3 F (36.8 C) (Oral)   Resp 16   Ht 5\' 5"  (1.651 m)   Wt 256 lb 3.2 oz (116.2 kg)   SpO2 99%   BMI 42.63 kg/m  Wt Readings from Last 3 Encounters:  11/11/19 256 lb 3.2 oz (116.2 kg)  10/22/19 260 lb (117.9 kg)  08/26/19 252 lb 12.8 oz (114.7 kg)    Diabetic Foot Exam - Simple   No data filed     Lab Results  Component Value Date   WBC 3.9 (L) 11/11/2019   HGB 14.3 11/11/2019   HCT 42.3 11/11/2019   PLT 365.0 11/11/2019   GLUCOSE 79 11/11/2019   CHOL 234 (H) 11/11/2019   TRIG 216.0 (H) 11/11/2019   HDL 45.10 11/11/2019   LDLDIRECT 159.0 11/11/2019   LDLCALC 148 (H) 07/29/2019   ALT 19 11/11/2019   AST 18 11/11/2019   NA 139 11/11/2019   K 4.5 11/11/2019   CL 104 11/11/2019   CREATININE  0.91 11/11/2019   BUN 10 11/11/2019   CO2 27 11/11/2019   TSH 1.30 11/11/2019   HGBA1C 6.5 11/11/2019   MICROALBUR <0.7 08/19/2019    Lab Results  Component Value Date   TSH 1.30 11/11/2019   Lab Results  Component Value Date   WBC 3.9 (L) 11/11/2019   HGB 14.3 11/11/2019   HCT 42.3 11/11/2019   MCV 90.6 11/11/2019   PLT 365.0 11/11/2019   Lab Results  Component Value Date   NA 139 11/11/2019   K 4.5 11/11/2019   CO2 27 11/11/2019   GLUCOSE 79 11/11/2019   BUN 10 11/11/2019   CREATININE 0.91 11/11/2019   BILITOT 0.3 11/11/2019   ALKPHOS 60 11/11/2019   AST  18 11/11/2019   ALT 19 11/11/2019   PROT 7.2 11/11/2019   ALBUMIN 4.5 11/11/2019   CALCIUM 10.3 11/11/2019   ANIONGAP 10 04/04/2018   GFR 76.57 11/11/2019   Lab Results  Component Value Date   CHOL 234 (H) 11/11/2019   Lab Results  Component Value Date   HDL 45.10 11/11/2019   Lab Results  Component Value Date   LDLCALC 148 (H) 07/29/2019   Lab Results  Component Value Date   TRIG 216.0 (H) 11/11/2019   Lab Results  Component Value Date   CHOLHDL 5 11/11/2019   Lab Results  Component Value Date   HGBA1C 6.5 11/11/2019       Assessment & Plan:   Problem List Items Addressed This Visit    Hypothyroidism - Primary    On Levothyroxine, continue to monitor      Hyperlipemia, mixed    Encouraged heart healthy diet, increase exercise, avoid trans fats, consider a krill oil cap daily. Had to stop her statin due to elevated CK about a week ago will recheck ck today      Relevant Orders   Lipid panel (Completed)   LDL cholesterol, direct (Completed)   OSA (obstructive sleep apnea)    She is on Provigil 400 mg and was hoping to increase but she is advised this is the maximum dose.       Essential hypertension    Well controlled, no changes to meds. Encouraged heart healthy diet such as the DASH diet and exercise as tolerated.       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 Referred to psychiatry. She will continue to work with behavioral health [facility]. Started on Duloxetine 30 mg po daily      Vitamin D deficiency   Diabetes mellitus type 2 in obese ([facility])    hgba1c acceptable, minimize simple carbs. Increase exercise as tolerated. Continue current meds      Relevant Orders   Hemoglobin A1c (Completed)   Chronic pain syndrome    Worsening now following with rheumatology and neurology. Neurology  stopped her statin  due to her elevated CK. Will recheck levels today. Wants to restart Gabapentin, had tried Lyrica but would like to switch back to Gabapentin.      Relevant Medications   gabapentin (NEURONTIN) 300 MG capsule   gabapentin (NEURONTIN) 100 MG capsule   DULoxetine (CYMBALTA) 30 MG capsule   Low back pain    She has had an epidural injection which was only partially helpful and has suffered a fall since then which flared the pain again. Will restart  Gabapentin as she feels that helped her in the past.       Elevated CK    She is following with rheumatology but no cause has been discovered yet. She has been off of her statin this week and her CK is still up repeat check in 4 weeks and stay off of the statin for now.       Relevant Orders   CK (Creatine Kinase) (Completed)   Lymphadenopathy    Right occiput just noted. She will report if it is persistent       Other Visit Diagnoses    Depression with anxiety       Relevant Medications   DULoxetine (CYMBALTA) 30 MG capsule   Other Relevant Orders   Ambulatory referral to Psychiatry      I have discontinued Tamantha S. Heninger's fenofibrate micronized, pregabalin, venlafaxine XR, and busPIRone. I have also changed her gabapentin. Additionally, I am having her start on gabapentin and DULoxetine. Lastly, I am having her maintain her Cod Liver Oil, loratadine, acetaminophen, diphenhydrAMINE, hyoscyamine, aspirin EC, metFORMIN, FREESTYLE LITE, Prenatal Vit-Fe Fumarate-FA (M-VIT PO), modafinil, Vitamin D (Ergocalciferol), furosemide, spironolactone, metoprolol succinate, linaclotide, magic mouthwash, Synthroid, and albuterol.  Meds ordered this encounter  Medications  . gabapentin (NEURONTIN) 300 MG capsule    Sig: Take 1-2 capsules (300-600 mg total) by mouth at bedtime.    Dispense:  180 capsule    Refill:  1  . gabapentin (NEURONTIN) 100 MG capsule    Sig: Take 1 capsule (100 mg total) by mouth 3 (three) times daily.     Dispense:  180 capsule    Refill:  1  . DULoxetine (CYMBALTA) 30 MG capsule    Sig: Take 1 capsule (30 mg total) by mouth daily.    Dispense:  30 capsule    Refill:  3     Penni Homans, MD

## 2019-11-12 NOTE — Assessment & Plan Note (Signed)
hgba1c acceptable, minimize simple carbs. Increase exercise as tolerated. Continue current meds 

## 2019-11-12 NOTE — Assessment & Plan Note (Signed)
She is on Provigil 400 mg and was hoping to increase but she is advised this is the maximum dose.

## 2019-11-12 NOTE — Assessment & Plan Note (Signed)
On Levothyroxine, continue to monitor 

## 2019-11-13 MED FILL — ALBUTEROL SULFATE HFA 108 (: 108 (90 BAS | 17 days supply | Qty: 18 | Fill #1

## 2019-11-18 ENCOUNTER — Ambulatory Visit: Payer: 59 | Admitting: Cardiology

## 2019-11-18 ENCOUNTER — Other Ambulatory Visit: Payer: Self-pay | Admitting: Family Medicine

## 2019-11-18 DIAGNOSIS — R519 Headache, unspecified: Secondary | ICD-10-CM

## 2019-11-18 DIAGNOSIS — E039 Hypothyroidism, unspecified: Secondary | ICD-10-CM

## 2019-11-18 NOTE — Telephone Encounter (Signed)
Patient does request CT

## 2019-11-20 DIAGNOSIS — I10 Essential (primary) hypertension: Secondary | ICD-10-CM | POA: Diagnosis not present

## 2019-11-20 DIAGNOSIS — G4733 Obstructive sleep apnea (adult) (pediatric): Secondary | ICD-10-CM | POA: Diagnosis not present

## 2019-11-21 ENCOUNTER — Ambulatory Visit: Payer: 59 | Admitting: Cardiology

## 2019-11-21 ENCOUNTER — Other Ambulatory Visit: Payer: Self-pay

## 2019-11-21 ENCOUNTER — Encounter: Payer: Self-pay | Admitting: Cardiology

## 2019-11-21 ENCOUNTER — Ambulatory Visit (HOSPITAL_BASED_OUTPATIENT_CLINIC_OR_DEPARTMENT_OTHER)
Admission: RE | Admit: 2019-11-21 | Discharge: 2019-11-21 | Disposition: A | Discharge status: Home / Self Care | Payer: 59 | Source: Ambulatory Visit | Attending: Family Medicine | Admitting: Family Medicine

## 2019-11-21 VITALS — BP 124/66 | HR 54 | Ht 64.5 in | Wt 255.1 lb

## 2019-11-21 DIAGNOSIS — E039 Hypothyroidism, unspecified: Secondary | ICD-10-CM | POA: Insufficient documentation

## 2019-11-21 DIAGNOSIS — E782 Mixed hyperlipidemia: Secondary | ICD-10-CM | POA: Diagnosis not present

## 2019-11-21 DIAGNOSIS — I10 Essential (primary) hypertension: Secondary | ICD-10-CM

## 2019-11-21 DIAGNOSIS — R519 Headache, unspecified: Secondary | ICD-10-CM | POA: Insufficient documentation

## 2019-11-21 NOTE — Patient Instructions (Signed)
Medication Instructions:  Your physician recommends that you continue on your current medications as directed. Please refer to the Current Medication list given to you today.  *If you need a refill on your cardiac medications before your next appointment, please call your pharmacy*   Lab Work: None ordered  If you have labs (blood work) drawn today and your tests are completely normal, you will receive your results only by: Marland Kitchen MyChart Message (if you have MyChart) OR . A paper copy in the mail If you have any lab test that is abnormal or we need to change your treatment, we will call you to review the results.   Testing/Procedures: None ordered   Follow-Up: At [facility], you and your health needs are our priority.  As part of our continuing mission to provide you with exceptional heart care, we have created designated Provider Care Teams.  These Care Teams include your primary Cardiologist (physician) and Advanced Practice Providers (APPs -  Physician Assistants and Nurse Practitioners) who all work together to provide you with the care you need, when you need it.  We recommend signing up for the patient portal called "MyChart".  Sign up information is provided on this After Visit Summary.  MyChart is used to connect with patients for Virtual Visits (Telemedicine).  Patients are able to view lab/test results, encounter notes, upcoming appointments, etc.  Non-urgent messages can be sent to your provider as well.   To learn more about what you can do with MyChart, go to [email or web address].    Your next appointment:   6 month(s)  The format for your next appointment:   In Person  Provider:   Jyl Heinz, MD   Other Instructions

## 2019-11-21 NOTE — Progress Notes (Signed)
Cardiology Office Note:    Date:  11/21/2019   ID:  [name], DOB 04-06-1961, MRN [id number]  PCP:  Mosie Lukes, MD  Cardiologist:  Jenean Lindau, MD   Referring MD: Mosie Lukes, MD    ASSESSMENT:    1. Essential hypertension   2. Hyperlipemia, mixed   3. Morbid obesity ([place])    PLAN:    In order of problems listed above:  1. Primary prevention stressed with the patient.  Importance of compliance with diet medication stressed and she vocalized understanding.  Importance of regular exercise stressed with the patient. 2. Essential hypertension: Blood pressure stable and diet was emphasized 3. Mixed dyslipidemia: Emphasized for elevated lipids.  Her calcium score is 0 and aggressive primary prevention stressed morbid obesity: Weight reduction was stressed as obesity explained and she vocalized understanding 4. Sleep apnea: Sleep health issues were mentioned I told her to be very meticulous with her using her sleep apnea machine and she is already doing it very meticulously. 5. Patient will be seen in follow-up appointment in 6 months or earlier if the patient has any concerns   Medication Adjustments/Labs and Tests Ordered: Current medicines are reviewed at length with the patient today.  Concerns regarding medicines are outlined above.  No orders of the defined types were placed in this encounter.  No orders of the defined types were placed in this encounter.    No chief complaint on file.    History of Present Illness:    [name] is a 59 y.o. female.  Patient has past medical history of essential hypertension dyslipidemia sleep apnea and morbid obesity.  She denies any problems at this time and takes care of activities of daily living.  No chest pain orthopnea or PND.  Her only problem is about weakness and overall feeling fatigued all day long.  At the time of my evaluation, the patient is alert awake oriented and in no distress.  Past Medical  History:  Diagnosis Date  . Acute bronchitis 05/25/2016  . Anxiety   . Chronic pain syndrome 01/06/2013  . Chronic rhinosinusitis   . Colon polyps   . Cough 01/06/2013  . Depression   . Diabetes ([place]) 01/06/2013  . Diabetes mellitus type 2 in obese [facility]) 01/06/2013   Sees Dr Calvert Cantor for eye exam Does not see podiatry, foot exam today unremarkable except for thick cracking skin on heals   . Dysuria 05/25/2016  . Edema 01/06/2013  . Epistaxis 05/25/2016  . Fibroid, uterine   . GERD (gastroesophageal reflux disease)   . Glaucoma 12-13  . Hoarseness   . Hoarseness of voice 05/11/2013  . Hyperglycemia 04/13/2013  . Hyperlipemia, mixed 06/06/2007   Qualifier: Diagnosis of  By: Wynona Luna She feels Lipitor caused increased low back pain and weakness    . Hyperlipidemia   . Hypertension   . Hypokalemia 02/05/2013  . IBS (irritable bowel syndrome) 02/13/2011  . Low back pain 03/03/2013  . Muscle cramp 05/22/2014  . Obesity   . Obesity, unspecified 05/11/2013  . OSA (obstructive sleep apnea)   . Pain in joint, shoulder region 06/27/2015  . Perimenopause 10/05/2012  . Raynaud disease 06/16/2013  . Thyroid disease    hypothyroidism  . Vocal fold nodules     Past Surgical History:  Procedure Laterality Date  . ABDOMINAL HYSTERECTOMY    . BREAST EXCISIONAL BIOPSY Right    at age 38  . CHOLECYSTECTOMY    .  CYSTECTOMY    . DILATION AND CURETTAGE OF UTERUS     x2  . I & D EXTREMITY Left 04/11/2018   Procedure: DEBIDMENT DISTAL INTERPHALANGEAL LEFT MIDDLE;  Surgeon: Daryll Brod, MD;  Location: [place];  Service: Orthopedics;  Laterality: Left;  Marland Kitchen MASS EXCISION Left 04/11/2018   Procedure: EXCISION MASS;  Surgeon: Daryll Brod, MD;  Location: [place];  Service: Orthopedics;  Laterality: Left;  . OOPHORECTOMY    . POLYPECTOMY    . ROTATOR CUFF REPAIR      Current Medications: Current Meds  Medication Sig  . acetaminophen (TYLENOL) 325 MG tablet  Take 650 mg by mouth every 6 (six) hours as needed.  Marland Kitchen albuterol (VENTOLIN HFA) 108 (90 Base) MCG/ACT inhaler Inhale 2 puffs into the lungs every 4 (four) hours as needed for wheezing or shortness of breath.  Marland Kitchen aspirin EC 81 MG tablet Take 1 tablet (81 mg total) by mouth daily.  Marland Kitchen Cod Liver Oil 1000 MG CAPS Take 1 capsule by mouth daily.  . diphenhydrAMINE (BENADRYL) 25 MG tablet Take 25 mg by mouth every 6 (six) hours as needed.  . DULoxetine (CYMBALTA) 30 MG capsule Take 1 capsule (30 mg total) by mouth daily.  Marland Kitchen FREESTYLE LITE test strip USE TO CHECK BLOOD SUGAR EVERY DAY AND AS NEEDED  . furosemide (LASIX) 20 MG tablet Take 1 tablet (20 mg total) by mouth daily as needed.  . gabapentin (NEURONTIN) 100 MG capsule Take 1 capsule (100 mg total) by mouth 3 (three) times daily.  Marland Kitchen gabapentin (NEURONTIN) 300 MG capsule Take 1-2 capsules (300-600 mg total) by mouth at bedtime.  . hyoscyamine (LEVSIN SL) 0.125 MG SL tablet Place 1 tablet (0.125 mg total) under the tongue every 4 (four) hours as needed.  Marland Kitchen ipratropium (ATROVENT) 0.06 % nasal spray Place 2 sprays into the nose 3 (three) times daily as needed.  . linaclotide (LINZESS) 290 MCG CAPS capsule Take 1 capsule (290 mcg total) by mouth daily before breakfast.  . loratadine (CLARITIN) 10 MG tablet Take 10 mg by mouth daily.  . metFORMIN (GLUCOPHAGE) 1000 MG tablet Take 0.5 tablets (500 mg total) by mouth daily with breakfast.  . metoprolol succinate (TOPROL-XL) 100 MG 24 hr tablet Take 1 tablet (100 mg total) by mouth daily. Take with or immediately following a meal.  . modafinil (PROVIGIL) 200 MG tablet TAKE 2 TABLETS (400 MG TOTAL) BY MOUTH DAILY.  Marland Kitchen Prenatal Vit-Fe Fumarate-FA (M-VIT PO) Take by mouth.  . spironolactone (ALDACTONE) 50 MG tablet Take 1 tablet (50 mg total) by mouth 3 (three) times daily.  Marland Kitchen SYNTHROID 100 MCG tablet TAKE 1 TABLET (100 MCG TOTAL) BY MOUTH DAILY BEFORE BREAKFAST.  Marland Kitchen Vitamin D, Ergocalciferol, (DRISDOL) 1.25 MG  (50000 UNIT) CAPS capsule Take 1 capsule (50,000 Units total) by mouth every 7 (seven) days.     Allergies:   Losartan, Wellbutrin [bupropion], Atorvastatin, Codeine, Amoxicillin, Erythromycin, and Penicillins   Social History   Socioeconomic History  . Marital status: Significant Other    Spouse name: Not on file  . Number of children: 0  . Years of education: Not on file  . Highest education level: Not on file  Occupational History  . 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 Smoking status: Former Smoker    Packs/day: 0.75    Years: 42.00    Pack years: 31.50    Types: Cigarettes    Quit date: 04/24/2005    Years  since quitting: 14.5  . Smokeless tobacco: Never Used  . Tobacco comment: smoked since age 16  Vaping Use  . Vaping Use: Never used  Substance and Sexual Activity  . Alcohol use: No  . Drug use: Never  . Sexual activity: Yes    Partners: Male  Other Topics Concern  . Not on file  Social History Narrative   Endo-- Dr Dwyane Dee   ENT--Dr shoemaker   GI--Dr Buccini   Pulm--Dr Clance   Rheum--Dr Charlestine Night         Social Determinants of Health   Financial Resource Strain:   . Difficulty of Paying Living Expenses:   Food Insecurity:   . Worried About Charity fundraiser in the Last Year:   . Arboriculturist in the Last Year:   Transportation Needs:   . Film/video editor (Medical):   Marland Kitchen Lack of Transportation (Non-Medical):   Physical Activity:   . Days of Exercise per Week:   . Minutes of Exercise per Session:   Stress:   . Feeling of Stress :   Social Connections:   . Frequency of Communication with Friends and Family:   . Frequency of Social Gatherings with Friends and Family:   . Attends Religious Services:   . Active Member of Clubs or Organizations:   . Attends Archivist Meetings:   Marland Kitchen Marital Status:      Family History: The patient's family history includes Alcohol abuse in her father; Anxiety disorder in her mother;  Breast cancer (age of onset: 72) in an other family member; Cancer in her father and paternal grandfather; Depression in her mother; Diabetes in her brother, sister, and another family member; Heart disease in her father; High Cholesterol in her mother; High blood pressure in her mother; Hyperlipidemia in her father; Hypertension in her father; Kidney disease in her mother; Obesity in her mother; Stroke in her father.  ROS:   Please see the history of present illness.    All other systems reviewed and are negative.  EKGs/Labs/Other Studies Reviewed:    The following studies were reviewed today: IMPRESSION: Coronary calcium score of 0. This was 0th percentile for age and sex matched control.  Bridgette Christopher   Electronically Signed   By: Buford Dresser M.D.   On: 07/18/2019 11:25    Recent Labs: 12/12/2018: Magnesium 1.9 11/11/2019: ALT 19; BUN 10; Creatinine, Ser 0.91; Hemoglobin 14.3; Platelets 365.0; Potassium 4.5; Sodium 139; TSH 1.30  Recent Lipid Panel    Component Value Date/Time   CHOL 234 (H) 11/11/2019 1125   CHOL 210 (H) 07/03/2017 1002   TRIG 216.0 (H) 11/11/2019 1125   HDL 45.10 11/11/2019 1125   HDL 47 07/03/2017 1002   CHOLHDL 5 11/11/2019 1125   VLDL 43.2 (H) 11/11/2019 1125   LDLCALC 148 (H) 07/29/2019 1348   LDLCALC 146 (H) 07/03/2017 1002   LDLDIRECT 159.0 11/11/2019 1125    Physical Exam:    VS:  BP 124/66   Pulse 54   Ht 5' 4.5" (1.638 m)   Wt (!) 255 lb 1.3 oz (115.7 kg)   SpO2 97%   BMI 43.11 kg/m     Wt Readings from Last 3 Encounters:  11/21/19 (!) 255 lb 1.3 oz (115.7 kg)  11/11/19 256 lb 3.2 oz (116.2 kg)  10/22/19 260 lb (117.9 kg)     GEN: Patient is in no acute distress HEENT: Normal NECK: No JVD; No carotid bruits LYMPHATICS: No lymphadenopathy CARDIAC: Hear  sounds regular, 2/6 systolic murmur at the apex. RESPIRATORY:  Clear to auscultation without rales, wheezing or rhonchi  ABDOMEN: Soft, non-tender,  non-distended MUSCULOSKELETAL:  No edema; No deformity  SKIN: Warm and dry NEUROLOGIC:  Alert and oriented x 3 PSYCHIATRIC:  Normal affect   Signed, Jenean Lindau, MD  11/21/2019 9:23 AM    [facility]

## 2019-11-25 ENCOUNTER — Telehealth: Payer: Self-pay

## 2019-11-25 ENCOUNTER — Other Ambulatory Visit: Payer: Self-pay | Admitting: Family Medicine

## 2019-11-25 ENCOUNTER — Other Ambulatory Visit: Payer: Self-pay

## 2019-11-25 MED ORDER — MODAFINIL 200 MG PO TABS: 400.0000 mg | ORAL_TABLET | Freq: Every day | ORAL | 3 refills | Status: DC

## 2019-11-25 MED FILL — FUROSEMIDE 20 MG TABS: 20 | 90 days supply | Qty: 90 | Fill #1

## 2019-11-25 NOTE — Progress Notes (Signed)
Imprivata still down send it back to me tomorrow and we will try again

## 2019-11-25 NOTE — Progress Notes (Signed)
Not sure what this message is about?

## 2019-11-25 NOTE — Progress Notes (Signed)
Refill for Provigil.

## 2019-11-25 NOTE — Progress Notes (Unsigned)
Last office visit- 11-11-2019

## 2019-11-25 NOTE — Telephone Encounter (Signed)
Duplicate message. 

## 2019-11-26 ENCOUNTER — Other Ambulatory Visit: Payer: Self-pay | Admitting: Family Medicine

## 2019-11-26 MED ORDER — MODAFINIL 200 MG PO TABS: 400.0000 mg | ORAL_TABLET | Freq: Every day | ORAL | 1 refills | Status: DC

## 2019-11-26 MED FILL — MODAFINIL 200 MG TABLET: 200 | 45 days supply | Qty: 90 | Fill #0

## 2019-11-26 NOTE — Progress Notes (Signed)
done

## 2019-11-26 NOTE — Progress Notes (Signed)
Retry to send Provigil

## 2019-11-27 ENCOUNTER — Ambulatory Visit: Payer: 59 | Admitting: Psychology

## 2019-11-28 ENCOUNTER — Ambulatory Visit: Payer: 59 | Admitting: Psychology

## 2019-12-09 DIAGNOSIS — R200 Anesthesia of skin: Secondary | ICD-10-CM | POA: Diagnosis not present

## 2019-12-09 DIAGNOSIS — R0602 Shortness of breath: Secondary | ICD-10-CM | POA: Diagnosis not present

## 2019-12-09 DIAGNOSIS — M542 Cervicalgia: Secondary | ICD-10-CM | POA: Diagnosis not present

## 2019-12-09 DIAGNOSIS — R531 Weakness: Secondary | ICD-10-CM | POA: Diagnosis not present

## 2019-12-09 MED FILL — DULoxetine HCL 30 MG CPEP: 30 | 30 days supply | Qty: 30 | Fill #1

## 2019-12-09 MED FILL — VIT D2 1.25 MG (50,000 UNIT: 1.25 MG | 28 days supply | Qty: 4 | Fill #0

## 2019-12-09 MED FILL — METOPROLOL SUCCINATE ER 100: 100 | 90 days supply | Qty: 90 | Fill #1

## 2019-12-09 MED FILL — SPIRONOLACTONE 50 MG TABS: 50 | 90 days supply | Qty: 270 | Fill #1

## 2019-12-16 DIAGNOSIS — D2222 Melanocytic nevi of left ear and external auricular canal: Secondary | ICD-10-CM | POA: Diagnosis not present

## 2019-12-16 DIAGNOSIS — R200 Anesthesia of skin: Secondary | ICD-10-CM | POA: Diagnosis not present

## 2019-12-16 DIAGNOSIS — D170 Benign lipomatous neoplasm of skin and subcutaneous tissue of head, face and neck: Secondary | ICD-10-CM | POA: Diagnosis not present

## 2019-12-16 DIAGNOSIS — L661 Lichen planopilaris: Secondary | ICD-10-CM | POA: Diagnosis not present

## 2019-12-16 DIAGNOSIS — M542 Cervicalgia: Secondary | ICD-10-CM | POA: Diagnosis not present

## 2019-12-16 DIAGNOSIS — R202 Paresthesia of skin: Secondary | ICD-10-CM | POA: Diagnosis not present

## 2019-12-16 DIAGNOSIS — L819 Disorder of pigmentation, unspecified: Secondary | ICD-10-CM | POA: Diagnosis not present

## 2019-12-16 DIAGNOSIS — L74511 Primary focal hyperhidrosis, face: Secondary | ICD-10-CM | POA: Diagnosis not present

## 2019-12-16 MED FILL — AZELAIC ACID 15 % GEL: 15 | 30 days supply | Qty: 50 | Fill #0

## 2019-12-17 ENCOUNTER — Other Ambulatory Visit (INDEPENDENT_AMBULATORY_CARE_PROVIDER_SITE_OTHER): Payer: 59

## 2019-12-17 ENCOUNTER — Other Ambulatory Visit: Payer: Self-pay

## 2019-12-17 DIAGNOSIS — R748 Abnormal levels of other serum enzymes: Secondary | ICD-10-CM | POA: Diagnosis not present

## 2019-12-17 NOTE — Addendum Note (Signed)
Addended by: Kelle Darting A on: 12/17/2019 09:00 AM   Modules accepted: Orders

## 2019-12-18 LAB — CK: Total CK: 219 U/L — ABNORMAL HIGH (ref 29–143)

## 2019-12-25 ENCOUNTER — Ambulatory Visit (INDEPENDENT_AMBULATORY_CARE_PROVIDER_SITE_OTHER): Payer: 59 | Admitting: Psychology

## 2019-12-25 DIAGNOSIS — F411 Generalized anxiety disorder: Secondary | ICD-10-CM | POA: Diagnosis not present

## 2019-12-26 ENCOUNTER — Other Ambulatory Visit: Payer: Self-pay | Admitting: Family Medicine

## 2019-12-26 ENCOUNTER — Other Ambulatory Visit: Payer: Self-pay

## 2019-12-26 ENCOUNTER — Ambulatory Visit: Payer: 59 | Admitting: Psychology

## 2019-12-26 ENCOUNTER — Telehealth (INDEPENDENT_AMBULATORY_CARE_PROVIDER_SITE_OTHER): Payer: 59 | Admitting: Family Medicine

## 2019-12-26 VITALS — BP 144/74 | HR 77 | Wt 255.0 lb

## 2019-12-26 DIAGNOSIS — E6609 Other obesity due to excess calories: Secondary | ICD-10-CM

## 2019-12-26 DIAGNOSIS — E559 Vitamin D deficiency, unspecified: Secondary | ICD-10-CM | POA: Diagnosis not present

## 2019-12-26 DIAGNOSIS — R748 Abnormal levels of other serum enzymes: Secondary | ICD-10-CM | POA: Diagnosis not present

## 2019-12-26 DIAGNOSIS — E039 Hypothyroidism, unspecified: Secondary | ICD-10-CM | POA: Diagnosis not present

## 2019-12-26 DIAGNOSIS — Z789 Other specified health status: Secondary | ICD-10-CM

## 2019-12-26 DIAGNOSIS — M7989 Other specified soft tissue disorders: Secondary | ICD-10-CM

## 2019-12-26 DIAGNOSIS — I10 Essential (primary) hypertension: Secondary | ICD-10-CM | POA: Diagnosis not present

## 2019-12-26 DIAGNOSIS — G5601 Carpal tunnel syndrome, right upper limb: Secondary | ICD-10-CM

## 2019-12-26 DIAGNOSIS — E669 Obesity, unspecified: Secondary | ICD-10-CM

## 2019-12-26 DIAGNOSIS — E1169 Type 2 diabetes mellitus with other specified complication: Secondary | ICD-10-CM

## 2019-12-26 DIAGNOSIS — E782 Mixed hyperlipidemia: Secondary | ICD-10-CM | POA: Diagnosis not present

## 2019-12-26 DIAGNOSIS — G894 Chronic pain syndrome: Secondary | ICD-10-CM

## 2019-12-26 MED ORDER — GABAPENTIN 100 MG PO CAPS
200.0000 mg | ORAL_CAPSULE | Freq: Two times a day (BID) | ORAL | 1 refills | Status: DC
Start: 2019-12-26 — End: 2020-08-11

## 2019-12-26 MED ORDER — DULOXETINE HCL 60 MG PO CPEP
60.0000 mg | ORAL_CAPSULE | Freq: Every day | ORAL | 1 refills | Status: DC
Start: 2019-12-26 — End: 2020-03-27

## 2019-12-26 MED FILL — DULoxetine HCL 60 MG CPEP: 60 | 90 days supply | Qty: 90 | Fill #0

## 2019-12-26 NOTE — Assessment & Plan Note (Signed)
Supplement and monitor 

## 2019-12-28 DIAGNOSIS — Z789 Other specified health status: Secondary | ICD-10-CM | POA: Insufficient documentation

## 2019-12-28 NOTE — Assessment & Plan Note (Signed)
Statin intolerant. Will refer to hyperlipidemia clinic for consideration

## 2019-12-28 NOTE — Assessment & Plan Note (Signed)
Confirmed recently with [facility]

## 2019-12-28 NOTE — Assessment & Plan Note (Signed)
Monitor and report concerns no changes to meds. Encouraged heart healthy diet such as the DASH diet and exercise as tolerated.

## 2019-12-28 NOTE — Assessment & Plan Note (Signed)
Improved some with ASA, fish oil and dietary changes. Will continue to monitor

## 2019-12-28 NOTE — Assessment & Plan Note (Signed)
Patient has done her research and is ready to proceed with bariatric surgery at [facility]. Referral is placed and she is encouraged to check with them to confirm where they would like her to do her monthly weight checks and let us know if she wants [place] to schedule them. Encouraged DASH diet, decrease po intake and increase exercise as tolerated. Needs 7-8 hours of sleep nightly. Avoid trans fats, eat small, frequent meals every 4-5 hours with lean proteins, complex carbs and healthy fats. Minimize simple carbs

## 2019-12-28 NOTE — Assessment & Plan Note (Signed)
Right posterior neck, check Soft Tissue Ultrasound and consider referral for excision depending on results

## 2019-12-28 NOTE — Progress Notes (Signed)
Virtual Visit via Video Note  I connected with [name] on 11/25/19 at  8:00 AM EDT by a video enabled telemedicine application and verified that I am speaking with the correct person using two identifiers.  Location: Patient: home Provider: home   I discussed the limitations of evaluation and management by telemedicine and the availability of in person appointments. The patient expressed understanding and agreed to proceed. Kem Boroughs, CMA was able to get the patient set up on a video visit    Subjective:    Patient ID: [name], female    DOB: 13-Feb-1961, 59 y.o.   MRN: [id number]  Chief Complaint  Patient presents with  . 6 follow up    HPI Patient is in today for follow up on chronic medical concerns. 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 She is also seeing endocrinology at [facility]. She saw her dermatologist and they suggested she have a mass on her neck subjected to imaging. The lesion on her right posterior neck is unchanged, not warm or hot. The Gabapentin is helping her manage her pain to some extent and no concerning side effects. No polyuria or polydipsia. Denies CP/palp/HA/congestion/fevers/GI or GU c/o. Taking meds as prescribed  Past Medical History:  Diagnosis Date  . Acute bronchitis 05/25/2016  . Anxiety   . Chronic pain syndrome 01/06/2013  . Chronic rhinosinusitis   . Colon polyps   . Cough 01/06/2013  . Depression   . Diabetes ([place]) 01/06/2013  . Diabetes mellitus type 2 in obese [facility]) 01/06/2013   Sees Dr Calvert Cantor for eye exam Does not see podiatry, foot exam today unremarkable except for thick cracking skin on heals   . Dysuria 05/25/2016  . Edema 01/06/2013  . Epistaxis 05/25/2016  . Fibroid, uterine   . GERD (gastroesophageal reflux disease)   . Glaucoma 12-13  . Hoarseness   . Hoarseness of voice 05/11/2013  . Hyperglycemia 04/13/2013  . Hyperlipemia, mixed 06/06/2007   Qualifier:  Diagnosis of  By: Wynona Luna She feels Lipitor caused increased low back pain and weakness    . Hyperlipidemia   . Hypertension   . Hypokalemia 02/05/2013  . IBS (irritable bowel syndrome) 02/13/2011  . Low back pain 03/03/2013  . Muscle cramp 05/22/2014  . Obesity   . Obesity, unspecified 05/11/2013  . OSA (obstructive sleep apnea)   . Pain in joint, shoulder region 06/27/2015  . Perimenopause 10/05/2012  . Raynaud disease 06/16/2013  . Thyroid disease    hypothyroidism  . Vocal fold nodules     Past Surgical History:  Procedure Laterality Date  . ABDOMINAL HYSTERECTOMY    . BREAST EXCISIONAL BIOPSY Right    at age 87  . CHOLECYSTECTOMY    . CYSTECTOMY    . DILATION AND CURETTAGE OF UTERUS     x2  . I & D EXTREMITY Left 04/11/2018   Procedure: DEBIDMENT DISTAL INTERPHALANGEAL LEFT MIDDLE;  Surgeon: Daryll Brod, MD;  Location: [facility];  Service: Orthopedics;  Laterality: Left;  Marland Kitchen MASS EXCISION Left 04/11/2018   Procedure: EXCISION MASS;  Surgeon: Daryll Brod, MD;  Location: [place];  Service: Orthopedics;  Laterality: Left;  . OOPHORECTOMY    . POLYPECTOMY    . ROTATOR CUFF REPAIR      Family History  Problem Relation Age of Onset  . Alcohol abuse Father   . Cancer Father  renal and colon  . Hyperlipidemia Father   . Hypertension Father   . Stroke Father   . Heart disease Father   . Cancer Paternal Grandfather        colon  . Diabetes Other   . High blood pressure Mother   . High Cholesterol Mother   . Kidney disease Mother   . Depression Mother   . Anxiety disorder Mother   . Obesity Mother   . Breast cancer Other 45  . Diabetes Sister   . Diabetes Brother     Social History   Socioeconomic History  . Marital status: Significant Other    Spouse name: Not on file  . Number of children: 0  . Years of education: Not on file  . Highest education level: Not on file  Occupational History  . 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 Smoking status: Former Smoker    Packs/day: 0.75    Years: 42.00    Pack years: 31.50    Types: Cigarettes    Quit date: 04/24/2005    Years since quitting: 14.6  . Smokeless tobacco: Never Used  . Tobacco comment: smoked since age 55  Vaping Use  . Vaping Use: Never used  Substance and Sexual Activity  . Alcohol use: No  . Drug use: Never  . Sexual activity: Yes    Partners: Male  Other Topics Concern  . Not on file  Social History Narrative   Endo-- Dr Dwyane Dee   ENT--Dr shoemaker   GI--Dr Buccini   Pulm--Dr Clance   Rheum--Dr Charlestine Night         Social Determinants of Health   Financial Resource Strain:   . Difficulty of Paying Living Expenses: Not on file  Food Insecurity:   . Worried About Charity fundraiser in the Last Year: Not on file  . Ran Out of Food in the Last Year: Not on file  Transportation Needs:   . Lack of Transportation (Medical): Not on file  . Lack of Transportation (Non-Medical): Not on file  Physical Activity:   . Days of Exercise per Week: Not on file  . Minutes of Exercise per Session: Not on file  Stress:   . Feeling of Stress : Not on file  Social Connections:   . Frequency of Communication with Friends and Family: Not on file  . Frequency of Social Gatherings with Friends and Family: Not on file  . Attends Religious Services: Not on file  . Active Member of Clubs or Organizations: Not on file  . Attends Archivist Meetings: Not on file  . Marital Status: Not on file  Intimate Partner Violence:   . Fear of Current or Ex-Partner: Not on file  . Emotionally Abused: Not on file  . Physically Abused: Not on file  . Sexually Abused: Not on file    Outpatient Medications Prior to Visit  Medication Sig Dispense Refill  . acetaminophen (TYLENOL) 325 MG tablet Take 650 mg by mouth every 6 (six) hours as needed.    Marland Kitchen albuterol (VENTOLIN HFA) 108 (90 Base) MCG/ACT inhaler Inhale 2 puffs into the lungs  every 4 (four) hours as needed for wheezing or shortness of breath. 18 g 5  . aspirin EC 81 MG tablet Take 1 tablet (81 mg total) by mouth daily.    Marland Kitchen Cod Liver Oil 1000 MG CAPS Take 1 capsule by mouth daily.    . diphenhydrAMINE (BENADRYL) 25 MG tablet Take 25  mg by mouth every 6 (six) hours as needed.    Marland Kitchen FREESTYLE LITE test strip USE TO CHECK BLOOD SUGAR EVERY DAY AND AS NEEDED 100 strip 5  . furosemide (LASIX) 20 MG tablet Take 1 tablet (20 mg total) by mouth daily as needed. 90 tablet 1  . gabapentin (NEURONTIN) 300 MG capsule Take 1-2 capsules (300-600 mg total) by mouth at bedtime. 180 capsule 1  . hyoscyamine (LEVSIN SL) 0.125 MG SL tablet Place 1 tablet (0.125 mg total) under the tongue every 4 (four) hours as needed. 40 tablet 1  . ipratropium (ATROVENT) 0.06 % nasal spray Place 2 sprays into the nose 3 (three) times daily as needed.    . linaclotide (LINZESS) 290 MCG CAPS capsule Take 1 capsule (290 mcg total) by mouth daily before breakfast. 90 capsule 1  . loratadine (CLARITIN) 10 MG tablet Take 10 mg by mouth daily.    . metFORMIN (GLUCOPHAGE) 1000 MG tablet Take 0.5 tablets (500 mg total) by mouth daily with breakfast. 45 tablet 0  . metoprolol succinate (TOPROL-XL) 100 MG 24 hr tablet Take 1 tablet (100 mg total) by mouth daily. Take with or immediately following a meal. 90 tablet 3  . modafinil (PROVIGIL) 200 MG tablet Take 2 tablets (400 mg total) by mouth daily. 90 tablet 1  . Prenatal Vit-Fe Fumarate-FA (M-VIT PO) Take by mouth.    . spironolactone (ALDACTONE) 50 MG tablet Take 1 tablet (50 mg total) by mouth 3 (three) times daily. 270 tablet 3  . SYNTHROID 100 MCG tablet TAKE 1 TABLET (100 MCG TOTAL) BY MOUTH DAILY BEFORE BREAKFAST. 90 tablet 1  . Vitamin D, Ergocalciferol, (DRISDOL) 1.25 MG (50000 UNIT) CAPS capsule Take 1 capsule (50,000 Units total) by mouth every 7 (seven) days. 4 capsule 4  . DULoxetine (CYMBALTA) 30 MG capsule Take 1 capsule (30 mg total) by mouth daily.  30 capsule 3  . gabapentin (NEURONTIN) 100 MG capsule Take 1 capsule (100 mg total) by mouth 3 (three) times daily. 180 capsule 1   No facility-administered medications prior to visit.    Allergies  Allergen Reactions  . Losartan Cough  . Wellbutrin [Bupropion] Other (See Comments)    Dizziness and mental status changes  . Atorvastatin     myalgias  . Codeine   . Amoxicillin Rash  . Erythromycin Rash  . Penicillins Rash    Review of Systems  Constitutional: Positive for malaise/fatigue. Negative for fever.  HENT: Negative for congestion.   Eyes: Negative for blurred vision.  Respiratory: Positive for shortness of breath.   Cardiovascular: Negative for chest pain, palpitations and leg swelling.  Gastrointestinal: Negative for abdominal pain, blood in stool and nausea.  Genitourinary: Negative for dysuria and frequency.  Musculoskeletal: Positive for back pain, joint pain and myalgias. Negative for falls.  Skin: Negative for rash.  Neurological: Negative for dizziness, loss of consciousness and headaches.  Endo/Heme/Allergies: Negative for environmental allergies.  Psychiatric/Behavioral: Negative for depression. The patient is not nervous/anxious.        Objective:    Physical Exam Constitutional:      Appearance: Normal appearance. She is obese. She is not ill-appearing.  HENT:     Head: Normocephalic and atraumatic.     Right Ear: External ear normal.     Left Ear: External ear normal.     Nose: Nose normal.  Pulmonary:     Effort: Pulmonary effort is normal.  Neurological:     Mental Status: She is alert and  oriented to person, place, and time.  Psychiatric:        Mood and Affect: Mood normal.        Behavior: Behavior normal.     BP (!) 144/74   Pulse 77   Wt 255 lb (115.7 kg)   BMI 43.09 kg/m  Wt Readings from Last 3 Encounters:  12/26/19 255 lb (115.7 kg)  11/21/19 (!) 255 lb 1.3 oz (115.7 kg)  11/11/19 256 lb 3.2 oz (116.2 kg)    Diabetic Foot  Exam - Simple   No data filed     Lab Results  Component Value Date   WBC 3.9 (L) 11/11/2019   HGB 14.3 11/11/2019   HCT 42.3 11/11/2019   PLT 365.0 11/11/2019   GLUCOSE 79 11/11/2019   CHOL 234 (H) 11/11/2019   TRIG 216.0 (H) 11/11/2019   HDL 45.10 11/11/2019   LDLDIRECT 159.0 11/11/2019   LDLCALC 148 (H) 07/29/2019   ALT 19 11/11/2019   AST 18 11/11/2019   NA 139 11/11/2019   K 4.5 11/11/2019   CL 104 11/11/2019   CREATININE 0.91 11/11/2019   BUN 10 11/11/2019   CO2 27 11/11/2019   TSH 1.30 11/11/2019   HGBA1C 6.5 11/11/2019   MICROALBUR <0.7 08/19/2019    Lab Results  Component Value Date   TSH 1.30 11/11/2019   Lab Results  Component Value Date   WBC 3.9 (L) 11/11/2019   HGB 14.3 11/11/2019   HCT 42.3 11/11/2019   MCV 90.6 11/11/2019   PLT 365.0 11/11/2019   Lab Results  Component Value Date   NA 139 11/11/2019   K 4.5 11/11/2019   CO2 27 11/11/2019   GLUCOSE 79 11/11/2019   BUN 10 11/11/2019   CREATININE 0.91 11/11/2019   BILITOT 0.3 11/11/2019   ALKPHOS 60 11/11/2019   AST 18 11/11/2019   ALT 19 11/11/2019   PROT 7.2 11/11/2019   ALBUMIN 4.5 11/11/2019   CALCIUM 10.3 11/11/2019   ANIONGAP 10 04/04/2018   GFR 76.57 11/11/2019   Lab Results  Component Value Date   CHOL 234 (H) 11/11/2019   Lab Results  Component Value Date   HDL 45.10 11/11/2019   Lab Results  Component Value Date   LDLCALC 148 (H) 07/29/2019   Lab Results  Component Value Date   TRIG 216.0 (H) 11/11/2019   Lab Results  Component Value Date   CHOLHDL 5 11/11/2019   Lab Results  Component Value Date   HGBA1C 6.5 11/11/2019       Assessment & Plan:   Problem List Items Addressed This Visit    Hypothyroidism   Relevant Orders   TSH   Hyperlipemia, mixed    Statin intolerant. Will refer to hyperlipidemia clinic for consideration      Relevant Orders   Ambulatory referral to Cardiology   Lipid panel   Essential hypertension    Monitor and report  concerns no changes to meds. Encouraged heart healthy diet such as the DASH diet and exercise as tolerated.       Relevant Orders   CBC   Comprehensive metabolic panel   TSH   Obesity    Patient has done her research and is ready to proceed with bariatric surgery at [facility]. Referral is placed and she is encouraged to check with them to confirm where they would like her to do her monthly weight checks and let us know if she wants [place] to schedule them. Encouraged DASH diet, decrease po intake and  increase exercise as tolerated. Needs 7-8 hours of sleep nightly. Avoid trans fats, eat small, frequent meals every 4-5 hours with lean proteins, complex carbs and healthy fats. Minimize simple carbs      Vitamin D deficiency    Supplement and monitor       Diabetes mellitus type 2 in obese (HCC)   Relevant Orders   Hemoglobin A1c   Chronic pain syndrome    Some relief with gabapentin and no concerning side effects so will try increasing dosing to 300 mg bid and 600 mg qhs as tolerated.      Relevant Medications   gabapentin (NEURONTIN) 100 MG capsule   DULoxetine (CYMBALTA) 60 MG capsule   Morbid obesity ([place])   Relevant Orders   Ambulatory referral to General Surgery   Carpal tunnel syndrome of right wrist    Confirmed recently with EMG      Relevant Medications   gabapentin (NEURONTIN) 100 MG capsule   DULoxetine (CYMBALTA) 60 MG capsule   Mass of soft tissue of neck - Primary    Right posterior neck, check Soft Tissue Ultrasound and consider referral for excision depending on results      Relevant Orders   US SOFT TISSUE HEAD & NECK (NON-THYROID)   Elevated CK    Improved some with ASA, fish oil and dietary changes. Will continue to monitor      Relevant Orders   CK (Creatine Kinase)   Statin intolerance   Relevant Orders   Ambulatory referral to Cardiology      I have discontinued Almena S. Mersch's DULoxetine. I have also changed her gabapentin. Additionally, I am  having her start on DULoxetine. Lastly, I am having her maintain her Cod Liver Oil, loratadine, acetaminophen, diphenhydrAMINE, hyoscyamine, aspirin EC, metFORMIN, FREESTYLE LITE, Prenatal Vit-Fe Fumarate-FA (M-VIT PO), Vitamin D (Ergocalciferol), furosemide, spironolactone, metoprolol succinate, linaclotide, Synthroid, albuterol, gabapentin, ipratropium, and modafinil.  Meds ordered this encounter  Medications  . gabapentin (NEURONTIN) 100 MG capsule    Sig: Take 2 capsules (200 mg total) by mouth 2 (two) times daily.    Dispense:  120 capsule    Refill:  1  . DULoxetine (CYMBALTA) 60 MG capsule    Sig: Take 1 capsule (60 mg total) by mouth daily.    Dispense:  90 capsule    Refill:  1    I discussed the assessment and treatment plan with the patient. The patient was provided an opportunity to ask questions and all were answered. The patient agreed with the plan and demonstrated an understanding of the instructions.   The patient was advised to call back or seek an in-person evaluation if the symptoms worsen or if the condition fails to improve as anticipated.  I provided 40 minutes of non-face-to-face time during this encounter.   Penni Homans, MD

## 2019-12-28 NOTE — Assessment & Plan Note (Signed)
Some relief with gabapentin and no concerning side effects so will try increasing dosing to 300 mg bid and 600 mg qhs as tolerated.

## 2019-12-29 ENCOUNTER — Encounter: Payer: Self-pay | Admitting: Family Medicine

## 2020-01-02 ENCOUNTER — Ambulatory Visit (HOSPITAL_BASED_OUTPATIENT_CLINIC_OR_DEPARTMENT_OTHER)
Admission: RE | Admit: 2020-01-02 | Discharge: 2020-01-02 | Disposition: A | Discharge status: Home / Self Care | Payer: 59 | Source: Ambulatory Visit | Attending: Family Medicine | Admitting: Family Medicine

## 2020-01-02 ENCOUNTER — Ambulatory Visit (INDEPENDENT_AMBULATORY_CARE_PROVIDER_SITE_OTHER): Payer: 59

## 2020-01-02 ENCOUNTER — Other Ambulatory Visit: Payer: Self-pay

## 2020-01-02 DIAGNOSIS — Z23 Encounter for immunization: Secondary | ICD-10-CM | POA: Diagnosis not present

## 2020-01-02 DIAGNOSIS — R221 Localized swelling, mass and lump, neck: Secondary | ICD-10-CM | POA: Diagnosis not present

## 2020-01-02 DIAGNOSIS — M7989 Other specified soft tissue disorders: Secondary | ICD-10-CM | POA: Diagnosis not present

## 2020-01-02 NOTE — Progress Notes (Signed)
Patient here for Flu vaccine and was administered Left Deltoid.  Patient tolerated well.

## 2020-01-02 NOTE — Telephone Encounter (Signed)
I have form here at office

## 2020-01-06 MED FILL — GABAPENTIN 100 MG CAPSULE: 100 | 30 days supply | Qty: 120 | Fill #0

## 2020-01-07 ENCOUNTER — Encounter: Payer: Self-pay | Admitting: *Deleted

## 2020-01-12 ENCOUNTER — Encounter: Payer: Self-pay | Admitting: *Deleted

## 2020-01-20 MED FILL — VIT D2 1.25 MG (50,000 UNIT: 1.25 MG | 28 days supply | Qty: 4 | Fill #1

## 2020-01-22 MED FILL — AZELAIC ACID 15 % GEL: 15 | 30 days supply | Qty: 50 | Fill #1

## 2020-01-28 ENCOUNTER — Other Ambulatory Visit (HOSPITAL_COMMUNITY): Payer: Self-pay | Admitting: "Endocrinology

## 2020-01-28 DIAGNOSIS — E039 Hypothyroidism, unspecified: Secondary | ICD-10-CM | POA: Diagnosis not present

## 2020-01-28 DIAGNOSIS — E049 Nontoxic goiter, unspecified: Secondary | ICD-10-CM | POA: Diagnosis not present

## 2020-01-28 MED FILL — LIOTHYRONINE SODIUM 5 MCG T: 5 | 30 days supply | Qty: 30 | Fill #0

## 2020-01-28 MED FILL — SYNTHROID 75 MCG TABLET: 75 | 30 days supply | Qty: 30 | Fill #0

## 2020-02-11 DIAGNOSIS — E1169 Type 2 diabetes mellitus with other specified complication: Secondary | ICD-10-CM | POA: Diagnosis not present

## 2020-02-11 DIAGNOSIS — K432 Incisional hernia without obstruction or gangrene: Secondary | ICD-10-CM | POA: Diagnosis not present

## 2020-02-11 DIAGNOSIS — M170 Bilateral primary osteoarthritis of knee: Secondary | ICD-10-CM | POA: Diagnosis not present

## 2020-02-11 DIAGNOSIS — G4733 Obstructive sleep apnea (adult) (pediatric): Secondary | ICD-10-CM | POA: Diagnosis not present

## 2020-02-11 DIAGNOSIS — Z9889 Other specified postprocedural states: Secondary | ICD-10-CM | POA: Diagnosis not present

## 2020-02-11 DIAGNOSIS — G8929 Other chronic pain: Secondary | ICD-10-CM | POA: Diagnosis not present

## 2020-02-11 DIAGNOSIS — I10 Essential (primary) hypertension: Secondary | ICD-10-CM | POA: Diagnosis not present

## 2020-02-11 DIAGNOSIS — M545 Low back pain, unspecified: Secondary | ICD-10-CM | POA: Diagnosis not present

## 2020-02-13 ENCOUNTER — Other Ambulatory Visit (INDEPENDENT_AMBULATORY_CARE_PROVIDER_SITE_OTHER): Payer: 59

## 2020-02-13 ENCOUNTER — Other Ambulatory Visit: Payer: Self-pay

## 2020-02-13 DIAGNOSIS — E782 Mixed hyperlipidemia: Secondary | ICD-10-CM

## 2020-02-13 DIAGNOSIS — I10 Essential (primary) hypertension: Secondary | ICD-10-CM | POA: Diagnosis not present

## 2020-02-13 DIAGNOSIS — E039 Hypothyroidism, unspecified: Secondary | ICD-10-CM

## 2020-02-13 DIAGNOSIS — E1169 Type 2 diabetes mellitus with other specified complication: Secondary | ICD-10-CM | POA: Diagnosis not present

## 2020-02-13 DIAGNOSIS — R748 Abnormal levels of other serum enzymes: Secondary | ICD-10-CM | POA: Diagnosis not present

## 2020-02-13 DIAGNOSIS — E669 Obesity, unspecified: Secondary | ICD-10-CM | POA: Diagnosis not present

## 2020-02-14 LAB — COMPREHENSIVE METABOLIC PANEL
AG Ratio: 1.6 (calc) (ref 1.0–2.5)
ALT: 21 U/L (ref 6–29)
AST: 19 U/L (ref 10–35)
Albumin: 4.1 g/dL (ref 3.6–5.1)
Alkaline phosphatase (APISO): 78 U/L (ref 37–153)
BUN: 12 mg/dL (ref 7–25)
CO2: 24 mmol/L (ref 20–32)
Calcium: 10 mg/dL (ref 8.6–10.4)
Chloride: 104 mmol/L (ref 98–110)
Creat: 0.83 mg/dL (ref 0.50–1.05)
Globulin: 2.6 g/dL (calc) (ref 1.9–3.7)
Glucose, Bld: 104 mg/dL — ABNORMAL HIGH (ref 65–99)
Potassium: 4.5 mmol/L (ref 3.5–5.3)
Sodium: 139 mmol/L (ref 135–146)
Total Bilirubin: 0.2 mg/dL (ref 0.2–1.2)
Total Protein: 6.7 g/dL (ref 6.1–8.1)

## 2020-02-14 LAB — LIPID PANEL
Cholesterol: 214 mg/dL — ABNORMAL HIGH (ref <200)
HDL: 44 mg/dL — ABNORMAL LOW (ref >=50)
LDL Cholesterol (Calc): 138 mg/dL (calc) — ABNORMAL HIGH
Non-HDL Cholesterol (Calc): 170 mg/dL (calc) — ABNORMAL HIGH (ref <130)
Total CHOL/HDL Ratio: 4.9 (calc) (ref <5.0)
Triglycerides: 182 mg/dL — ABNORMAL HIGH (ref <150)

## 2020-02-14 LAB — HEMOGLOBIN A1C
Hgb A1c MFr Bld: 6.4 % of total Hgb — ABNORMAL HIGH (ref <5.7)
Mean Plasma Glucose: 137 (calc)
eAG (mmol/L): 7.6 (calc)

## 2020-02-14 LAB — CK: Total CK: 262 U/L — ABNORMAL HIGH (ref 29–143)

## 2020-02-14 LAB — CBC
HCT: 43.4 % (ref 35.0–45.0)
Hemoglobin: 14.1 g/dL (ref 11.7–15.5)
MCH: 30.3 pg (ref 27.0–33.0)
MCHC: 32.5 g/dL (ref 32.0–36.0)
MCV: 93.1 fL (ref 80.0–100.0)
MPV: 9.8 fL (ref 7.5–12.5)
Platelets: 343 10*3/uL (ref 140–400)
RBC: 4.66 10*6/uL (ref 3.80–5.10)
RDW: 13.5 % (ref 11.0–15.0)
WBC: 4.6 10*3/uL (ref 3.8–10.8)

## 2020-02-14 LAB — TSH: TSH: 1.02 mIU/L (ref 0.40–4.50)

## 2020-02-16 ENCOUNTER — Other Ambulatory Visit: Payer: Self-pay | Admitting: General Surgery

## 2020-02-16 ENCOUNTER — Other Ambulatory Visit (HOSPITAL_COMMUNITY): Payer: Self-pay | Admitting: General Surgery

## 2020-02-17 ENCOUNTER — Other Ambulatory Visit: Payer: Self-pay | Admitting: Family Medicine

## 2020-02-17 MED FILL — VIT D2 1.25 MG (50,000 UNIT: 1.25 MG | 28 days supply | Qty: 4 | Fill #2

## 2020-02-17 MED FILL — MODAFINIL 200 MG TABLET: 200 | 45 days supply | Qty: 90 | Fill #1

## 2020-02-17 MED FILL — SYNTHROID 100 MCG TABLET: 100 | 90 days supply | Qty: 90 | Fill #1

## 2020-02-17 MED FILL — FUROSEMIDE 20 MG TABS: 20 | 90 days supply | Qty: 90 | Fill #0

## 2020-02-17 MED FILL — GABAPENTIN 300 MG CAPSULE: 300 | 90 days supply | Qty: 180 | Fill #1

## 2020-02-20 ENCOUNTER — Telehealth (INDEPENDENT_AMBULATORY_CARE_PROVIDER_SITE_OTHER): Payer: 59 | Admitting: Family Medicine

## 2020-02-20 ENCOUNTER — Other Ambulatory Visit: Payer: Self-pay | Admitting: Family Medicine

## 2020-02-20 ENCOUNTER — Other Ambulatory Visit: Payer: Self-pay

## 2020-02-20 DIAGNOSIS — I10 Essential (primary) hypertension: Secondary | ICD-10-CM

## 2020-02-20 DIAGNOSIS — E039 Hypothyroidism, unspecified: Secondary | ICD-10-CM | POA: Diagnosis not present

## 2020-02-20 DIAGNOSIS — E782 Mixed hyperlipidemia: Secondary | ICD-10-CM

## 2020-02-20 DIAGNOSIS — G4733 Obstructive sleep apnea (adult) (pediatric): Secondary | ICD-10-CM | POA: Diagnosis not present

## 2020-02-20 DIAGNOSIS — K649 Unspecified hemorrhoids: Secondary | ICD-10-CM

## 2020-02-20 DIAGNOSIS — E1169 Type 2 diabetes mellitus with other specified complication: Secondary | ICD-10-CM

## 2020-02-20 DIAGNOSIS — E669 Obesity, unspecified: Secondary | ICD-10-CM | POA: Diagnosis not present

## 2020-02-20 MED ORDER — HYDROCORTISONE ACETATE 25 MG RE SUPP: 25.0000 mg | Freq: Every evening | RECTAL | 2 refills | Status: DC | PRN

## 2020-02-20 MED ORDER — METOPROLOL SUCCINATE ER 50 MG PO TB24: ORAL_TABLET | ORAL | 1 refills | Status: DC

## 2020-02-20 MED FILL — METOPROLOL SUCCINATE ER 50: 50 | 90 days supply | Qty: 270 | Fill #0

## 2020-02-20 MED FILL — HYDROCORTISONE ACETATE 25 M: 25 | 10 days supply | Qty: 10 | Fill #0

## 2020-02-20 NOTE — Patient Instructions (Signed)
-  follow up in 3 months

## 2020-02-22 DIAGNOSIS — K649 Unspecified hemorrhoids: Secondary | ICD-10-CM | POA: Insufficient documentation

## 2020-02-22 NOTE — Assessment & Plan Note (Signed)
Does not tolerate oral statins, she is awaiting appointment with lipid clinic

## 2020-02-22 NOTE — Progress Notes (Signed)
Virtual Visit via Video Note  I connected with [name] on 02/20/20 at  8:20 AM EDT by a video enabled telemedicine application and verified that I am speaking with the correct person using two identifiers.  Location: Patient: home, patient and provider are in visit Provider: home   I discussed the limitations of evaluation and management by telemedicine and the availability of in person appointments. The patient expressed understanding and agreed to proceed. Ronda Fairly, CMA was able to get the patient set up on a video visit    Subjective:    Patient ID: [name], female    DOB: [birth year], 59 y.o.   MRN: [id number]  Chief Complaint  Patient presents with  . Follow-up    HPI Patient is in today for follow up on chronic medical concerns. No recent febrile illness or hospitalizations. She has been seen by a bariatric surgeon and is in the process of undergoing a work up to proceed with the surgery. No recent febrile illness or hospitalizations but she has been under a great deal of stress and pain recently. Her Blood sugars have been in the 80s to 140s recently. Denies CP/palp/SOB/HA/congestion/fevers or GU c/o. Taking meds as prescribed. She complains of rectal irritation but not blood.   Past Medical History:  Diagnosis Date  . Acute bronchitis 05/25/2016  . Anxiety   . Chronic pain syndrome 01/06/2013  . Chronic rhinosinusitis   . Colon polyps   . Cough 01/06/2013  . Depression   . Diabetes ([place]) 01/06/2013  . Diabetes mellitus type 2 in obese [facility]) 01/06/2013   Sees Dr Calvert Cantor for eye exam Does not see podiatry, foot exam today unremarkable except for thick cracking skin on heals   . Dysuria 05/25/2016  . Edema 01/06/2013  . Epistaxis 05/25/2016  . Fibroid, uterine   . GERD (gastroesophageal reflux disease)   . Glaucoma 12-13  . Hoarseness   . Hoarseness of voice 05/11/2013  . Hyperglycemia 04/13/2013  . Hyperlipemia, mixed 06/06/2007   Qualifier: Diagnosis of  By:  Wynona Luna She feels Lipitor caused increased low back pain and weakness    . Hyperlipidemia   . Hypertension   . Hypokalemia 02/05/2013  . IBS (irritable bowel syndrome) 02/13/2011  . Low back pain 03/03/2013  . Muscle cramp 05/22/2014  . Obesity   . Obesity, unspecified 05/11/2013  . OSA (obstructive sleep apnea)   . Pain in joint, shoulder region 06/27/2015  . Perimenopause 10/05/2012  . Raynaud disease 06/16/2013  . Thyroid disease    hypothyroidism  . Vocal fold nodules     Past Surgical History:  Procedure Laterality Date  . ABDOMINAL HYSTERECTOMY    . BREAST EXCISIONAL BIOPSY Right    at age 33  . CHOLECYSTECTOMY    . CYSTECTOMY    . DILATION AND CURETTAGE OF UTERUS     x2  . I & D EXTREMITY Left 04/11/2018   Procedure: DEBIDMENT DISTAL INTERPHALANGEAL LEFT MIDDLE;  Surgeon: Daryll Brod, MD;  Location: [place];  Service: Orthopedics;  Laterality: Left;  Marland Kitchen MASS EXCISION Left 04/11/2018   Procedure: EXCISION MASS;  Surgeon: Daryll Brod, MD;  Location: [place];  Service: Orthopedics;  Laterality: Left;  . OOPHORECTOMY    . POLYPECTOMY    . ROTATOR CUFF REPAIR      Family History  Problem Relation Age of Onset  . Alcohol abuse Father   . Cancer Father  renal and colon  . Hyperlipidemia Father   . Hypertension Father   . Stroke Father   . Heart disease Father   . Cancer Paternal Grandfather        colon  . Diabetes Other   . High blood pressure Mother   . High Cholesterol Mother   . Kidney disease Mother   . Depression Mother   . Anxiety disorder Mother   . Obesity Mother   . Breast cancer Other 45  . Diabetes Sister   . Diabetes Brother     Social History   Socioeconomic History  . Marital status: Significant Other    Spouse name: Not on file  . Number of children: 0  . Years of education: Not on file  . Highest education level: Not on file  Occupational History  . 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 Smoking status: Former Smoker    Packs/day: 0.75    Years: 42.00    Pack years: 31.50    Types: Cigarettes    Quit date: 04/24/2005    Years since quitting: 14.8  . Smokeless tobacco: Never Used  . Tobacco comment: smoked since age 54  Vaping Use  . Vaping Use: Never used  Substance and Sexual Activity  . Alcohol use: No  . Drug use: Never  . Sexual activity: Yes    Partners: Male  Other Topics Concern  . Not on file  Social History Narrative   Endo-- Dr Dwyane Dee   ENT--Dr shoemaker   GI--Dr Buccini   Pulm--Dr Clance   Rheum--Dr Charlestine Night         Social Determinants of Health   Financial Resource Strain:   . Difficulty of Paying Living Expenses: Not on file  Food Insecurity:   . Worried About Charity fundraiser in the Last Year: Not on file  . Ran Out of Food in the Last Year: Not on file  Transportation Needs:   . Lack of Transportation (Medical): Not on file  . Lack of Transportation (Non-Medical): Not on file  Physical Activity:   . Days of Exercise per Week: Not on file  . Minutes of Exercise per Session: Not on file  Stress:   . Feeling of Stress : Not on file  Social Connections:   . Frequency of Communication with Friends and Family: Not on file  . Frequency of Social Gatherings with Friends and Family: Not on file  . Attends Religious Services: Not on file  . Active Member of Clubs or Organizations: Not on file  . Attends Archivist Meetings: Not on file  . Marital Status: Not on file  Intimate Partner Violence:   . Fear of Current or Ex-Partner: Not on file  . Emotionally Abused: Not on file  . Physically Abused: Not on file  . Sexually Abused: Not on file    Outpatient Medications Prior to Visit  Medication Sig Dispense Refill  . acetaminophen (TYLENOL) 325 MG tablet Take 650 mg by mouth every 6 (six) hours as needed.    Marland Kitchen albuterol (VENTOLIN HFA) 108 (90 Base) MCG/ACT inhaler Inhale 2 puffs into the lungs every 4 (four) hours  as needed for wheezing or shortness of breath. 18 g 5  . aspirin EC 81 MG tablet Take 1 tablet (81 mg total) by mouth daily.    Marland Kitchen Cod Liver Oil 1000 MG CAPS Take 1 capsule by mouth daily.    . diphenhydrAMINE (BENADRYL) 25 MG tablet Take  25 mg by mouth every 6 (six) hours as needed.    . DULoxetine (CYMBALTA) 60 MG capsule Take 1 capsule (60 mg total) by mouth daily. 90 capsule 1  . FREESTYLE LITE test strip USE TO CHECK BLOOD SUGAR EVERY DAY AND AS NEEDED 100 strip 5  . furosemide (LASIX) 20 MG tablet TAKE 1 TABLET (20 MG TOTAL) BY MOUTH DAILY AS NEEDED. 90 tablet 1  . gabapentin (NEURONTIN) 100 MG capsule Take 2 capsules (200 mg total) by mouth 2 (two) times daily. 120 capsule 1  . gabapentin (NEURONTIN) 300 MG capsule Take 1-2 capsules (300-600 mg total) by mouth at bedtime. 180 capsule 1  . hyoscyamine (LEVSIN SL) 0.125 MG SL tablet Place 1 tablet (0.125 mg total) under the tongue every 4 (four) hours as needed. 40 tablet 1  . ipratropium (ATROVENT) 0.06 % nasal spray Place 2 sprays into the nose 3 (three) times daily as needed.    . linaclotide (LINZESS) 290 MCG CAPS capsule Take 1 capsule (290 mcg total) by mouth daily before breakfast. 90 capsule 1  . loratadine (CLARITIN) 10 MG tablet Take 10 mg by mouth daily.    . metFORMIN (GLUCOPHAGE) 1000 MG tablet Take 0.5 tablets (500 mg total) by mouth daily with breakfast. 45 tablet 0  . metoprolol succinate (TOPROL-XL) 100 MG 24 hr tablet Take 1 tablet (100 mg total) by mouth daily. Take with or immediately following a meal. 90 tablet 3  . modafinil (PROVIGIL) 200 MG tablet Take 2 tablets (400 mg total) by mouth daily. 90 tablet 1  . Prenatal Vit-Fe Fumarate-FA (M-VIT PO) Take by mouth.    . spironolactone (ALDACTONE) 50 MG tablet Take 1 tablet (50 mg total) by mouth 3 (three) times daily. 270 tablet 3  . Vitamin D, Ergocalciferol, (DRISDOL) 1.25 MG (50000 UNIT) CAPS capsule Take 1 capsule (50,000 Units total) by mouth every 7 (seven) days. 4  capsule 4  . SYNTHROID 100 MCG tablet TAKE 1 TABLET (100 MCG TOTAL) BY MOUTH DAILY BEFORE BREAKFAST. 90 tablet 1  . levothyroxine (SYNTHROID) 75 MCG tablet Take 1 tablet (75 mcg total) by mouth daily. 90 tablet 3  . liothyronine (CYTOMEL) 5 MCG tablet Take 1 tablet (5 mcg total) by mouth daily.     No facility-administered medications prior to visit.    Allergies  Allergen Reactions  . Crestor [Rosuvastatin] Other (See Comments)    Myalgias and rising CPK  . Losartan Cough  . Wellbutrin [Bupropion] Other (See Comments)    Dizziness and mental status changes  . Atorvastatin     myalgias  . Codeine   . Amoxicillin Rash  . Erythromycin Rash  . Penicillins Rash    Review of Systems  Constitutional: Positive for malaise/fatigue. Negative for fever.  HENT: Negative for congestion.   Eyes: Negative for blurred vision.  Respiratory: Negative for shortness of breath.   Cardiovascular: Negative for chest pain, palpitations and leg swelling.  Gastrointestinal: Negative for abdominal pain, blood in stool, melena and nausea.  Genitourinary: Negative for dysuria and frequency.  Musculoskeletal: Positive for back pain, joint pain and myalgias. Negative for falls.  Skin: Negative for rash.  Neurological: Negative for dizziness, loss of consciousness and headaches.  Endo/Heme/Allergies: Negative for environmental allergies.  Psychiatric/Behavioral: Negative for depression. The patient is not nervous/anxious.        Objective:    Physical Exam Constitutional:      Appearance: Normal appearance. She is obese. She is not ill-appearing.  HENT:  Head: Normocephalic and atraumatic.     Right Ear: External ear normal.     Left Ear: External ear normal.     Nose: Nose normal.  Eyes:     General:        Right eye: No discharge.        Left eye: No discharge.  Pulmonary:     Effort: Pulmonary effort is normal.  Neurological:     Mental Status: She is alert and oriented to person,  place, and time.  Psychiatric:        Behavior: Behavior normal.     BP 135/85   Pulse 78   Wt 256 lb (116.1 kg)   BMI 43.26 kg/m  Wt Readings from Last 3 Encounters:  02/20/20 256 lb (116.1 kg)  12/26/19 255 lb (115.7 kg)  11/21/19 (!) 255 lb 1.3 oz (115.7 kg)    Diabetic Foot Exam - Simple   No data filed     Lab Results  Component Value Date   WBC 4.6 02/13/2020   HGB 14.1 02/13/2020   HCT 43.4 02/13/2020   PLT 343 02/13/2020   GLUCOSE 104 (H) 02/13/2020   CHOL 214 (H) 02/13/2020   TRIG 182 (H) 02/13/2020   HDL 44 (L) 02/13/2020   LDLDIRECT 159.0 11/11/2019   LDLCALC 138 (H) 02/13/2020   ALT 21 02/13/2020   AST 19 02/13/2020   NA 139 02/13/2020   K 4.5 02/13/2020   CL 104 02/13/2020   CREATININE 0.83 02/13/2020   BUN 12 02/13/2020   CO2 24 02/13/2020   TSH 1.02 02/13/2020   HGBA1C 6.4 (H) 02/13/2020   MICROALBUR <0.7 08/19/2019    Lab Results  Component Value Date   TSH 1.02 02/13/2020   Lab Results  Component Value Date   WBC 4.6 02/13/2020   HGB 14.1 02/13/2020   HCT 43.4 02/13/2020   MCV 93.1 02/13/2020   PLT 343 02/13/2020   Lab Results  Component Value Date   NA 139 02/13/2020   K 4.5 02/13/2020   CO2 24 02/13/2020   GLUCOSE 104 (H) 02/13/2020   BUN 12 02/13/2020   CREATININE 0.83 02/13/2020   BILITOT 0.2 02/13/2020   ALKPHOS 60 11/11/2019   AST 19 02/13/2020   ALT 21 02/13/2020   PROT 6.7 02/13/2020   ALBUMIN 4.5 11/11/2019   CALCIUM 10.0 02/13/2020   ANIONGAP 10 04/04/2018   GFR 76.57 11/11/2019   Lab Results  Component Value Date   CHOL 214 (H) 02/13/2020   Lab Results  Component Value Date   HDL 44 (L) 02/13/2020   Lab Results  Component Value Date   LDLCALC 138 (H) 02/13/2020   Lab Results  Component Value Date   TRIG 182 (H) 02/13/2020   Lab Results  Component Value Date   CHOLHDL 4.9 02/13/2020   Lab Results  Component Value Date   HGBA1C 6.4 (H) 02/13/2020       Assessment & Plan:   Problem List  Items Addressed This Visit    Hypothyroidism    On Levothyroxine but has been seen by endocrinology, Dr Honor Junes and they have decreased 75 mcg daily and they added Cytomel 5 mg daily.       Relevant Medications   metoprolol succinate (TOPROL-XL) 50 MG 24 hr tablet   liothyronine (CYTOMEL) 5 MCG tablet   levothyroxine (SYNTHROID) 75 MCG tablet   Hyperlipemia, mixed    Does not tolerate oral statins, she is awaiting appointment with lipid clinic  Relevant Medications   metoprolol succinate (TOPROL-XL) 50 MG 24 hr tablet   Essential hypertension    Monitor and report any concerning symptoms no changes to meds. Encouraged heart healthy diet such as the DASH diet and exercise as tolerated.       Relevant Medications   metoprolol succinate (TOPROL-XL) 50 MG 24 hr tablet   Diabetes mellitus type 2 in obese (HCC)    hgba1c acceptable, minimize simple carbs. Increase exercise as tolerated. Continue current meds      Morbid obesity ([place])    She has worked with Dr [name] and has decided to proceed with Bariatric surgery and is awaiting nutrition and psychologic evaluations as well as KUB and CXR next week.      Hemorrhoids    Given a prescription for Anusol HC suppositories to use prn. If no improvement she will follow up with gastroenterology.      Relevant Medications   metoprolol succinate (TOPROL-XL) 50 MG 24 hr tablet      I have discontinued Adhya S. Abernathy's Synthroid. I am also having her start on metoprolol succinate and hydrocortisone. Additionally, I am having her maintain her Cod Liver Oil, loratadine, acetaminophen, diphenhydrAMINE, hyoscyamine, aspirin EC, metFORMIN, FREESTYLE LITE, Prenatal Vit-Fe Fumarate-FA (M-VIT PO), Vitamin D (Ergocalciferol), spironolactone, metoprolol succinate, linaclotide, albuterol, gabapentin, ipratropium, modafinil, gabapentin, DULoxetine, furosemide, liothyronine, and levothyroxine.  Meds ordered this encounter  Medications  .  metoprolol succinate (TOPROL-XL) 50 MG 24 hr tablet    Sig: 50 mg in am and then 100 mg in pm poTake with or immediately following a meal.    Dispense:  270 tablet    Refill:  1  . hydrocortisone (ANUSOL-HC) 25 MG suppository    Sig: Place 1 suppository (25 mg total) rectally at bedtime as needed for hemorrhoids or anal itching.    Dispense:  10 suppository    Refill:  2     I discussed the assessment and treatment plan with the patient. The patient was provided an opportunity to ask questions and all were answered. The patient agreed with the plan and demonstrated an understanding of the instructions.   The patient was advised to call back or seek an in-person evaluation if the symptoms worsen or if the condition fails to improve as anticipated.  I provided 20 minutes of non-face-to-face time during this encounter.   Penni Homans, MD

## 2020-02-22 NOTE — Assessment & Plan Note (Signed)
Monitor and report any concerning symptoms no changes to meds. Encouraged heart healthy diet such as the DASH diet and exercise as tolerated.

## 2020-02-22 NOTE — Assessment & Plan Note (Signed)
She has worked with Dr [name] and has decided to proceed with Bariatric surgery and is awaiting nutrition and psychologic evaluations as well as KUB and CXR next week.

## 2020-02-22 NOTE — Assessment & Plan Note (Signed)
Given a prescription for Anusol HC suppositories to use prn. If no improvement she will follow up with gastroenterology.

## 2020-02-22 NOTE — Assessment & Plan Note (Signed)
hgba1c acceptable, minimize simple carbs. Increase exercise as tolerated. Continue current meds 

## 2020-02-22 NOTE — Assessment & Plan Note (Addendum)
On Levothyroxine but has been seen by endocrinology, Dr Honor Junes and they have decreased 75 mcg daily and they added Cytomel 5 mg daily.

## 2020-02-25 MED FILL — SYNTHROID 75 MCG TABLET: 75 | 30 days supply | Qty: 30 | Fill #1

## 2020-02-25 MED FILL — LIOTHYRONINE SODIUM 5 MCG T: 5 | 30 days supply | Qty: 30 | Fill #1

## 2020-02-27 ENCOUNTER — Other Ambulatory Visit: Payer: Self-pay

## 2020-02-27 ENCOUNTER — Ambulatory Visit (HOSPITAL_COMMUNITY)
Admission: RE | Admit: 2020-02-27 | Discharge: 2020-02-27 | Disposition: A | Discharge status: Home / Self Care | Payer: 59 | Source: Ambulatory Visit | Attending: General Surgery | Admitting: General Surgery

## 2020-02-27 DIAGNOSIS — Z01818 Encounter for other preprocedural examination: Secondary | ICD-10-CM | POA: Diagnosis not present

## 2020-02-27 DIAGNOSIS — K224 Dyskinesia of esophagus: Secondary | ICD-10-CM | POA: Diagnosis not present

## 2020-02-27 DIAGNOSIS — I10 Essential (primary) hypertension: Secondary | ICD-10-CM | POA: Diagnosis not present

## 2020-03-02 ENCOUNTER — Encounter: Payer: Self-pay | Admitting: Skilled Nursing Facility1

## 2020-03-02 ENCOUNTER — Other Ambulatory Visit: Payer: Self-pay

## 2020-03-02 ENCOUNTER — Encounter: Payer: 59 | Attending: General Surgery | Admitting: Skilled Nursing Facility1

## 2020-03-02 DIAGNOSIS — E669 Obesity, unspecified: Secondary | ICD-10-CM | POA: Diagnosis not present

## 2020-03-02 NOTE — Progress Notes (Signed)
Nutrition Assessment for Bariatric Surgery Medical Nutrition Therapy Appt Start Time:   11:00  End Time: 12:10  Patient was seen on 03/02/2020 for Pre-Operative Nutrition Assessment. Letter of approval faxed to [facility] bariatric surgery program coordinator on 03/02/2020  Referral stated Supervised Weight Loss (SWL) visits needed: 0; dietitian will see pt again just to help her with changes as she continues on with the surgery process not to hold surgery just to use the time she has while waiting on insurance approval   Planned surgery: RYGB Pt expectation of surgery: to lose weight Pt expectation of dietitian: to help educate    NUTRITION ASSESSMENT   Anthropometrics  Start weight at [facility]: 254.9 lbs (date: 03/02/2020)  Height: 64.5 in BMI: 43.08 kg/m2     Clinical  Medical hx: HTN, OSA, GERD, IBS, Hypothyroidism, diabetes Medications: metformin  Labs: A1C 6.4 Notable signs/symptoms: sweating, edema, hypokalemia, cannot stand or walk for long time Any previous deficiencies? Vitamin D  Micronutrient Nutrition Focused Physical Exam: Hair: No issues observed Eyes: No issues observed Mouth: No issues observed Neck: No issues observed Nails: No issues observed Skin: No issues observed  Lifestyle & Dietary Hx  Pt arrived sweating for no reasonable answer: pt checked her blood sugars in office (94)  Pt states she has been feeling out of breath lately which is new for her. Pt states she feels a little off and usually does for the first few hours of waking. Pt states she wears her C-PAP every night. Pt states her tongue feels funny and has not had a good taste for food for years (her tongue did have some cracks in it). Pt states she eats a lot of cinnamon hot balls. Pt states she does eat sometimes when she feels nervous thinking it is low blood sugar: Dietitian advised pt to check her blood sugars when she feels that way. Pt states she does not check her blood sugar. Pt  states she checks her blood pressure sometimes at home but not with regularity.  Pt states she thinks maybe she has anxiety (pt admits later she was nervous for this appt).  Pt states she does have muscle twitching and cramping (K 4.5). CK 262 Pt states she lives with her boyfriend since 68 stating he is supportive.  Pt states she wants to do water aerobics thinking maybe she will do the [facility].  Pt state she works 3 days a week. Pt states she does not enjoy cooking. Pt states work is every stressful and feels he has to be very focused so is stressed at the thought of not.  Pt states he has no problem with her sleep. Pt states she is learning to eat non-starchy vegetables without fat back.   24-Hr Dietary Recall First Meal4:30am: chicken nuggets or chicken wings or steak biscuit  Snack: 2 granola bar + peeanuts Second Meal: eaten out Snack:  Third Meal: eaten out Snack: seeds or hot candies Beverages: sweet tea, juice, starbucks can   Estimated Energy Needs Calories: 1500  NUTRITION DIAGNOSIS  Overweight/obesity ([place]-3.3) related to past poor dietary habits and physical inactivity as evidenced by patient w/ planned RYGB surgery following dietary guidelines for continued weight loss.    NUTRITION INTERVENTION  Nutrition counseling (C-1) and education (E-2) to facilitate bariatric surgery goals.   Pre-Op Goals Reviewed with the Patient . Track food and beverage intake (pen and paper, MyFitness Pal, Baritastic app, etc.) . Make healthy food choices while monitoring portion sizes . Consume 3 meals  per day or try to eat every 3-5 hours . Avoid concentrated sugars and fried foods . Keep sugar & fat in the single digits per serving on food labels . Practice CHEWING your food (aim for applesauce consistency) . Practice not drinking 15 minutes before, during, and 30 minutes after each meal and snack . Avoid all carbonated beverages (ex: soda, sparkling beverages)  . Limit caffeinated beverages  (ex: coffee, tea, energy drinks) . Avoid all sugar-sweetened beverages (ex: regular soda, sports drinks)  . Avoid alcohol  . Aim for 64-100 ounces of FLUID daily (with at least half of fluid intake being plain water)  . Aim for at least 60-80 grams of PROTEIN daily . Look for a liquid protein source that contains ?15 g protein and ?5 g carbohydrate (ex: shakes, drinks, shots) . Make a list of non-food related activities . Physical activity is an important part of a healthy lifestyle so keep it moving! The goal is to reach 150 minutes of exercise per week, including cardiovascular and weight baring activity.  *Goals that are bolded indicate the pt would like to start working towards these  Goals: Breakfast ideas: 2 eggs + 1 slice of toast or 1 piece of fruit OR english muffin + egg + cheese OR peanut butter toast OR Skyr  Try drinking 1 Starbucks drink one day and then half on the other 2 days  Aim to make 4 dinners from home between now and your next appointment including non starchy vegetables without fat: broccoli, zucchini, cauliflower, greens beans, carrots, etc   Handouts Provided Include  . Bariatric Surgery handouts (Nutrition Visits, Pre-Op Goals, Protein Shakes, Vitamins & Minerals)  Learning Style & Readiness for Change Teaching method utilized: Visual & Auditory  Demonstrated degree of understanding via: Teach Back  Barriers to learning/adherence to lifestyle change: pre contemplative stage of change     MONITORING & EVALUATION Dietary intake, weekly physical activity, body weight, and pre-op goals reached at next nutrition visit.    Next Steps  Patient is to follow up at [place] for Pre-Op Class >2 weeks before surgery for further nutrition education.

## 2020-03-05 DIAGNOSIS — E119 Type 2 diabetes mellitus without complications: Secondary | ICD-10-CM | POA: Diagnosis not present

## 2020-03-05 LAB — HM DIABETES EYE EXAM

## 2020-03-11 ENCOUNTER — Encounter: Payer: Self-pay | Admitting: Internal Medicine

## 2020-03-11 ENCOUNTER — Ambulatory Visit: Payer: 59 | Admitting: Internal Medicine

## 2020-03-11 ENCOUNTER — Other Ambulatory Visit: Payer: Self-pay

## 2020-03-11 ENCOUNTER — Other Ambulatory Visit: Payer: Self-pay | Admitting: Internal Medicine

## 2020-03-11 VITALS — BP 128/77 | HR 80 | Ht 64.5 in | Wt 257.0 lb

## 2020-03-11 DIAGNOSIS — T466X5A Adverse effect of antihyperlipidemic and antiarteriosclerotic drugs, initial encounter: Secondary | ICD-10-CM

## 2020-03-11 DIAGNOSIS — E782 Mixed hyperlipidemia: Secondary | ICD-10-CM | POA: Diagnosis not present

## 2020-03-11 DIAGNOSIS — M791 Myalgia, unspecified site: Secondary | ICD-10-CM | POA: Diagnosis not present

## 2020-03-11 DIAGNOSIS — I10 Essential (primary) hypertension: Secondary | ICD-10-CM | POA: Diagnosis not present

## 2020-03-11 DIAGNOSIS — R748 Abnormal levels of other serum enzymes: Secondary | ICD-10-CM | POA: Diagnosis not present

## 2020-03-11 MED ORDER — EZETIMIBE 10 MG PO TABS: 10.0000 mg | ORAL_TABLET | Freq: Every day | ORAL | 3 refills | Status: DC

## 2020-03-11 MED FILL — EZETIMIBE 10 MG TABS: 10 | 90 days supply | Qty: 90 | Fill #0

## 2020-03-11 NOTE — Progress Notes (Signed)
LIPID CLINIC CONSULT NOTE  Chief Complaint:  Manage dyslipidemia  Primary Care Physician: Mosie Lukes, MD  Primary Cardiologist:  No primary care provider on file.  HPI:  [name] is a 59 y.o. female who is being seen today for the evaluation of dyslipidemia at the request of Mosie Lukes, MD.  This is a pleasant 59 year old female here for evaluation management of dyslipidemia.  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  More recently she struggled with weight and is actually being evaluated by Dr. [name] for bariatric surgery.  In addition her cholesterol has been elevated, more recently total cholesterol 214, HDL 44, triglycerides 182 and LDL 138.  She denies any prior history of coronary disease although does have risk factors including hypertension, type 2 diabetes and a family history of heart disease.  She had previously tried a number of statins but is struggled with myalgias and possibly has fibromyalgia.  She had seen a rheumatologist for work-up for elevated CKs.  At one point she had a CK up in the 2400 but in general his CK tends to be around in the mid 200s without any statin therapy.  Currently she is not on any treatments.  PMHx:  Past Medical History:  Diagnosis Date  . Acute bronchitis 05/25/2016  . Anxiety   . Chronic pain syndrome 01/06/2013  . Chronic rhinosinusitis   . Colon polyps   . Cough 01/06/2013  . Depression   . Diabetes ([place]) 01/06/2013  . Diabetes mellitus type 2 in obese [facility]) 01/06/2013   Sees Dr Calvert Cantor for eye exam Does not see podiatry, foot exam today unremarkable except for thick cracking skin on heals   . Dysuria 05/25/2016  . Edema 01/06/2013  . Epistaxis 05/25/2016  . Fibroid, uterine   . GERD (gastroesophageal reflux disease)   . Glaucoma 12-13  . Hoarseness   . Hoarseness of voice 05/11/2013  . Hyperglycemia 04/13/2013  . Hyperlipemia, mixed 06/06/2007   Qualifier: Diagnosis of  By: Wynona Luna She feels Lipitor caused increased low back pain and weakness    . Hyperlipidemia   . Hypertension   . Hypokalemia 02/05/2013  . IBS (irritable bowel syndrome) 02/13/2011  . Low back pain 03/03/2013  . Muscle cramp 05/22/2014  . Obesity   . Obesity, unspecified 05/11/2013  . OSA (obstructive sleep apnea)   . Pain in joint, shoulder region 06/27/2015  . Perimenopause 10/05/2012  . Raynaud disease 06/16/2013  . Thyroid disease    hypothyroidism  . Vocal fold nodules     Past Surgical History:  Procedure Laterality Date  . ABDOMINAL HYSTERECTOMY    . BREAST EXCISIONAL BIOPSY Right    at age 73  . CHOLECYSTECTOMY    . CYSTECTOMY    . DILATION AND CURETTAGE OF UTERUS     x2  . I & D EXTREMITY Left 04/11/2018   Procedure: DEBIDMENT DISTAL INTERPHALANGEAL LEFT MIDDLE;  Surgeon: Daryll Brod, MD;  Location: [place];  Service: Orthopedics;  Laterality: Left;  Marland Kitchen MASS EXCISION Left 04/11/2018   Procedure: EXCISION MASS;  Surgeon: Daryll Brod, MD;  Location: [place];  Service: Orthopedics;  Laterality: Left;  . OOPHORECTOMY    . POLYPECTOMY    . ROTATOR CUFF REPAIR      FAMHx:  Family History  Problem Relation Age of Onset  . Alcohol abuse Father   . Cancer Father  renal and colon  . Hyperlipidemia Father   . Hypertension Father   . Stroke Father   . Heart disease Father   . Cancer Paternal Grandfather        colon  . Diabetes Other   . High blood pressure Mother   . High Cholesterol Mother   . Kidney disease Mother   . Depression Mother   . Anxiety disorder Mother   . Obesity Mother   . Breast cancer Other 45  . Diabetes Sister   . Diabetes Brother     SOCHx:   reports that she quit smoking about 14 years ago. Her smoking use included cigarettes. She has a 31.50 pack-year smoking history. She has never used smokeless tobacco. She reports that she does not drink alcohol and does not use drugs.  ALLERGIES:  Allergies   Allergen Reactions  . Crestor [Rosuvastatin] Other (See Comments)    Myalgias and rising CPK  . Losartan Cough  . Wellbutrin [Bupropion] Other (See Comments)    Dizziness and mental status changes  . Atorvastatin     myalgias  . Codeine   . Amoxicillin Rash  . Erythromycin Rash  . Penicillins Rash    ROS: Pertinent items noted in HPI and remainder of comprehensive ROS otherwise negative.  HOME MEDS: Current Outpatient Medications on File Prior to Visit  Medication Sig Dispense Refill  . acetaminophen (TYLENOL) 325 MG tablet Take 650 mg by mouth every 6 (six) hours as needed.    Marland Kitchen albuterol (VENTOLIN HFA) 108 (90 Base) MCG/ACT inhaler Inhale 2 puffs into the lungs every 4 (four) hours as needed for wheezing or shortness of breath. 18 g 5  . aspirin EC 81 MG tablet Take 1 tablet (81 mg total) by mouth daily.    Marland Kitchen Cod Liver Oil 1000 MG CAPS Take 1 capsule by mouth daily.    . diphenhydrAMINE (BENADRYL) 25 MG tablet Take 25 mg by mouth every 6 (six) hours as needed.    . DULoxetine (CYMBALTA) 60 MG capsule Take 1 capsule (60 mg total) by mouth daily. 90 capsule 1  . FREESTYLE LITE test strip USE TO CHECK BLOOD SUGAR EVERY DAY AND AS NEEDED 100 strip 5  . furosemide (LASIX) 20 MG tablet TAKE 1 TABLET (20 MG TOTAL) BY MOUTH DAILY AS NEEDED. 90 tablet 1  . gabapentin (NEURONTIN) 100 MG capsule Take 2 capsules (200 mg total) by mouth 2 (two) times daily. 120 capsule 1  . gabapentin (NEURONTIN) 300 MG capsule Take 1-2 capsules (300-600 mg total) by mouth at bedtime. 180 capsule 1  . hydrocortisone (ANUSOL-HC) 25 MG suppository Place 1 suppository (25 mg total) rectally at bedtime as needed for hemorrhoids or anal itching. 10 suppository 2  . hyoscyamine (LEVSIN SL) 0.125 MG SL tablet Place 1 tablet (0.125 mg total) under the tongue every 4 (four) hours as needed. 40 tablet 1  . ipratropium (ATROVENT) 0.06 % nasal spray Place 2 sprays into the nose 3 (three) times daily as needed.    Marland Kitchen  levothyroxine (SYNTHROID) 75 MCG tablet Take 1 tablet (75 mcg total) by mouth daily. 90 tablet 3  . linaclotide (LINZESS) 290 MCG CAPS capsule Take 1 capsule (290 mcg total) by mouth daily before breakfast. 90 capsule 1  . liothyronine (CYTOMEL) 5 MCG tablet Take 1 tablet (5 mcg total) by mouth daily.    Marland Kitchen loratadine (CLARITIN) 10 MG tablet Take 10 mg by mouth daily.    . metFORMIN (GLUCOPHAGE) 1000 MG tablet Take  0.5 tablets (500 mg total) by mouth daily with breakfast. 45 tablet 0  . metoprolol succinate (TOPROL-XL) 100 MG 24 hr tablet Take 1 tablet (100 mg total) by mouth daily. Take with or immediately following a meal. 90 tablet 3  . metoprolol succinate (TOPROL-XL) 50 MG 24 hr tablet 50 mg in am and then 100 mg in pm poTake with or immediately following a meal. 270 tablet 1  . modafinil (PROVIGIL) 200 MG tablet Take 2 tablets (400 mg total) by mouth daily. 90 tablet 1  . Prenatal Vit-Fe Fumarate-FA (M-VIT PO) Take by mouth.    . spironolactone (ALDACTONE) 50 MG tablet Take 1 tablet (50 mg total) by mouth 3 (three) times daily. 270 tablet 3  . Vitamin D, Ergocalciferol, (DRISDOL) 1.25 MG (50000 UNIT) CAPS capsule Take 1 capsule (50,000 Units total) by mouth every 7 (seven) days. 4 capsule 4   No current facility-administered medications on file prior to visit.    LABS/IMAGING: No results found for this or any previous visit (from the past 48 hour(s)). No results found.  LIPID PANEL:    Component Value Date/Time   CHOL 214 (H) 02/13/2020 0857   CHOL 210 (H) 07/03/2017 1002   TRIG 182 (H) 02/13/2020 0857   HDL 44 (L) 02/13/2020 0857   HDL 47 07/03/2017 1002   CHOLHDL 4.9 02/13/2020 0857   VLDL 43.2 (H) 11/11/2019 1125   LDLCALC 138 (H) 02/13/2020 0857   LDLDIRECT 159.0 11/11/2019 1125    WEIGHTS: Wt Readings from Last 3 Encounters:  03/11/20 257 lb (116.6 kg)  03/02/20 254 lb 14.4 oz (115.6 kg)  02/20/20 256 lb (116.1 kg)    VITALS: BP 128/77   Pulse 80   Ht 5' 4.5"  (1.638 m)   Wt 257 lb (116.6 kg)   SpO2 99%   BMI 43.43 kg/m   EXAM: Deferred  EKG: Deferred  ASSESSMENT: 1. Mixed dyslipidemia 2. Statin myalgias,?  Statin myopathy with elevated CKs 3. Family history of coronary disease 4. Type 2 diabetes 5. Hypertension  PLAN: 1.   [name] has a mixed dyslipidemia and has been intolerant to statins due to issues with either myalgia and questionable myopathy with elevated CPK.  Her baselines are in the mid 200s which given her race and gender is not necessarily significantly elevated.  I suspect she has a baseline elevation in CK but that other medications could affect this including statins.  I think it would be wise to avoid a statin.  Because of her risk I would target her LDL to less than 70 and think we could reach that possibly with ezetimibe.  Start 10 mg daily and repeat a CK and see med in about 2 weeks.  If this appears to be well-tolerated we will check fasting lipids in 3 months and follow-up at that time.  Thanks for the kind referral  Pixie Casino, MD, FACC, [place] Director of the [facility] Lip[facility] &  Cardiovascular [facility] Reduction Clinic Diplomate of the American Board of Clinical Lipidology Attending Cardiologist  Direct Dial: [phone]  Fax: [phone]  Website:  [email or web address] 03/11/2020, 4:19 PM

## 2020-03-11 NOTE — Patient Instructions (Signed)
Medication Instructions:  START zetia 10mg  daily  *If you need a refill on your cardiac medications before your next appointment, please call your pharmacy*   Lab Work: CMET/CK 2 weeks after starting medication   FASTING lipid panel in 3 months  If you have labs (blood work) drawn today and your tests are completely normal, you will receive your results only by: Marland Kitchen MyChart Message (if you have MyChart) OR . A paper copy in the mail If you have any lab test that is abnormal or we need to change your treatment, we will call you to review the results.   Testing/Procedures: NONE   Follow-Up: At [facility], you and your health needs are our priority.  As part of our continuing mission to provide you with exceptional heart care, we have created designated Provider Care Teams.  These Care Teams include your primary Cardiologist (physician) and Advanced Practice Providers (APPs -  Physician Assistants and Nurse Practitioners) who all work together to provide you with the care you need, when you need it.  We recommend signing up for the patient portal called "MyChart".  Sign up information is provided on this After Visit Summary.  MyChart is used to connect with patients for Virtual Visits (Telemedicine).  Patients are able to view lab/test results, encounter notes, upcoming appointments, etc.  Non-urgent messages can be sent to your provider as well.   To learn more about what you can do with MyChart, go to [email or web address].    Your next appointment:   3-4 month(s) - lipid clinic  The format for your next appointment:   In Person  Provider:   K. Mali Hilty, MD   Other Instructions

## 2020-03-15 MED FILL — VIT D2 1.25 MG (50,000 UNIT: 1.25 MG | 28 days supply | Qty: 4 | Fill #3

## 2020-03-16 ENCOUNTER — Other Ambulatory Visit: Payer: Self-pay

## 2020-03-16 ENCOUNTER — Encounter: Payer: 59 | Admitting: Skilled Nursing Facility1

## 2020-03-16 DIAGNOSIS — E669 Obesity, unspecified: Secondary | ICD-10-CM

## 2020-03-16 NOTE — Progress Notes (Signed)
Pre Surgery Follow Up Bariatric Nutrition Education  Planned Surgery: RYGB Pt Expectation of Surgery/ Goals: to lose weight   Star given previously: NO  NUTRITION ASSESSMENT  Anthropometrics  Start weight at [facility]: 254.9 lbs (date: 03/02/2020)  Weight: 257 pounds Height: 64.5 in BMI: 46.26 kg/m2     Clinical  Medical hx: HTN, OSA, GERD, IBS, Hypothyroidism, diabetes Medications: metformin  Labs: A1C 6.4 Notable signs/symptoms: sweating, edema, hypokalemia, cannot stand or walk for long time Any previous deficiencies? Vitamin D  Lifestyle & Dietary Hx  Pt state she works 3 days a week  Pt states these last few weeks she has felt really hungry. Pt states she was not able to meet the goal of eating lunch due to not taking a lunch at work stating she feels 30 minutes is not long enough. Pt state sif she eats too fast she will have diarrhea 30-60 minutes later. Pt states she only does not eat lunch one day a week. Pt states she hates greek yogurt. Pt states she has tried to limit her starbucks coffee but them chose another cafeinated high fat drink. Pt states she was able to make 4 dinners from home in the last 2 weeks. Pt states the last 3 days with dinner she has been drinking water. Pt states physical activity hurts her back and makes her sweat too much. Pt states she wears her C-PAP every night. Pt state she takes medication that helps her to focus.  Pt states since March she has been feeling off having trouble describing how she feels or what she means: dietitian helped pt put words to her meaning which she decided it feels like there is chaos in her brain and she cannot think linearly. Pt states even in her home she used to be more organized but feels she has more junk disorganized rooms. Pt states her boyfriend has also noticed she tries to talk faster than she can get thoughts out of her mouth.  Estimated daily fluid intake: unknown oz Supplements:  Current average weekly physical  activity: ADL's  24-Hr Dietary Recall First Meal 8: chicken salad and crackers Snack 12: protein shake and grapes Second Meal:  Snack: Third Meal: steak, potato, broccoli  Snack:  Beverages: bang keto coffee, sweet tea, water  Estimated Energy Needs Calories: 1500   NUTRITION DIAGNOSIS  Overweight/obesity ([place]-3.3) related to past poor dietary habits and physical inactivity as evidenced by patient w/ planned RYGB surgery following dietary guidelines for continued weight loss.   NUTRITION INTERVENTION  Nutrition counseling (C-1) and education (E-2) to facilitate bariatric surgery goals.  Pre-Op Goals Progress & New Goals  Breakfast ideas: 2 eggs + 1 slice of toast or 1 piece of fruit OR english muffin + egg + cheese OR peanut butter toast OR Skyr  Try drinking 1 Starbucks drink one day and then half on the other 2 days  Aim to make 4 dinners from home between now and your next appointment including non starchy vegetables without fat: broccoli, zucchini, cauliflower, greens beans, carrots, etc  NEW: take pictures of everything you put in your mouth food, drink, snacks, etc. NEW: Tell your PCP how you have been feeling with your thoughts   Handouts Provided Include    Learning Style & Readiness for Change Teaching method utilized: Visual & Auditory  Demonstrated degree of understanding via: Teach Back  Readiness Level: Action Barriers to learning/adherence to lifestyle change: stated chaos of thoughts   RD's Notes for next Visit  . Assess  pts adherence to chosen goals   MONITORING & EVALUATION Dietary intake, weekly physical activity, body weight, and pre-op goals   Next Steps  Patient is to return to [facility]

## 2020-03-22 ENCOUNTER — Ambulatory Visit: Payer: 59 | Admitting: Professional

## 2020-03-22 DIAGNOSIS — I10 Essential (primary) hypertension: Secondary | ICD-10-CM | POA: Diagnosis not present

## 2020-03-22 DIAGNOSIS — G4733 Obstructive sleep apnea (adult) (pediatric): Secondary | ICD-10-CM | POA: Diagnosis not present

## 2020-03-23 ENCOUNTER — Ambulatory Visit: Payer: 59 | Admitting: Professional

## 2020-03-23 MED FILL — SYNTHROID 75 MCG TABLET: 75 | 30 days supply | Qty: 30 | Fill #2

## 2020-03-23 MED FILL — METOPROLOL SUCCINATE ER 100: 100 | 90 days supply | Qty: 90 | Fill #2

## 2020-03-23 MED FILL — ALBUTEROL SULFATE HFA 108 (: 108 (90 BAS | 17 days supply | Qty: 18 | Fill #2

## 2020-03-23 MED FILL — DULoxetine HCL 60 MG CPEP: 60 | 90 days supply | Qty: 90 | Fill #1

## 2020-03-23 MED FILL — SPIRONOLACTONE 50 MG TABS: 50 | 90 days supply | Qty: 270 | Fill #2

## 2020-03-23 MED FILL — LIOTHYRONINE SODIUM 5 MCG T: 5 | 30 days supply | Qty: 30 | Fill #2

## 2020-03-24 DIAGNOSIS — H40013 Open angle with borderline findings, low risk, bilateral: Secondary | ICD-10-CM | POA: Diagnosis not present

## 2020-03-25 ENCOUNTER — Encounter: Payer: Self-pay | Admitting: Family Medicine

## 2020-03-27 ENCOUNTER — Other Ambulatory Visit: Payer: Self-pay | Admitting: Family Medicine

## 2020-03-27 MED ORDER — SAVELLA TITRATION PACK 12.5 & 25 & 50 MG PO MISC: ORAL | 0 refills | Status: DC

## 2020-03-27 MED ORDER — DULOXETINE HCL 30 MG PO CPEP: 30.0000 mg | ORAL_CAPSULE | Freq: Every day | ORAL | 0 refills | Status: DC

## 2020-03-27 MED ORDER — SAVELLA 50 MG PO TABS: 50.0000 mg | ORAL_TABLET | Freq: Two times a day (BID) | ORAL | 1 refills | Status: DC

## 2020-03-28 ENCOUNTER — Other Ambulatory Visit (HOSPITAL_COMMUNITY): Payer: Self-pay | Admitting: "Endocrinology

## 2020-03-29 MED FILL — SAVELLA TITRATION PACK: 12.5 & 25 & | 30 days supply | Qty: 55 | Fill #0

## 2020-03-29 MED FILL — ARMOUR THYROID 90 MG TABLET: 90 | 30 days supply | Qty: 30 | Fill #0

## 2020-03-29 MED FILL — DULoxetine HCL 30 MG CPEP: 30 | 15 days supply | Qty: 15 | Fill #0

## 2020-03-30 ENCOUNTER — Other Ambulatory Visit: Payer: Self-pay

## 2020-03-30 ENCOUNTER — Encounter: Payer: 59 | Attending: General Surgery | Admitting: Skilled Nursing Facility1

## 2020-03-30 DIAGNOSIS — E1169 Type 2 diabetes mellitus with other specified complication: Secondary | ICD-10-CM | POA: Diagnosis not present

## 2020-03-30 DIAGNOSIS — E669 Obesity, unspecified: Secondary | ICD-10-CM | POA: Diagnosis not present

## 2020-03-30 NOTE — Progress Notes (Signed)
Pre Surgery Follow Up Bariatric Nutrition Education  Planned Surgery: RYGB Pt Expectation of Surgery/ Goals: to lose weight   Star given previously: NO  NUTRITION ASSESSMENT  Anthropometrics  Start weight at [facility]: 254.9 lbs (date: 03/02/2020)  Weight: 257 pounds Height: 64.5 in BMI: 46.26 kg/m2     Clinical  Medical hx: HTN, OSA, GERD, IBS, Hypothyroidism, diabetes Medications: metformin  Labs: A1C 6.4 Notable signs/symptoms: sweating, edema, hypokalemia, cannot stand or walk for long time Any previous deficiencies? Vitamin D  Lifestyle & Dietary Hx  Pt states she works 3 days a week  Pt states she has had some changes in her thyroid medications, getting off Celebrex and starting  A new medication that will help her with her brain fog/chaotic thoughts.   Pt state she has not seen the phycologist for surgery yet.  Pt state she has talked about seeing a mental health professional before with her doctor was unable to go to the practice she was referred to.   Pt states she has been checking her blood sugars and did have her meter with her today: 3pm (unknown what day) 72; 03/25/2020 6pm 134. Pt was going all the way back top last year with her numbers but not many recently, May be having some low blood sugars contributing to mental fog.   Pt states she was able to take some pictures of her foods but states she turns her cell phone when she is off work and just uses her house phone.   Pt state she tried [employer] based nutrition but did not like it.  Pt state she forgot she was told not to drink the keto coffee  Pt state she will not drink water while at work because then she will have to use the restroom recognizing that is a big problem.   Pt states last week she was really hungry but this week is not as hungry and cannot really hear her satisfaction/fullness cues.   Estimated daily fluid intake: unknown oz Supplements:  Current average weekly physical activity: ADL's   24-Hr Dietary Recall First Meal 8: chicken salad and crackers or 4 eggs Snack 12: protein shake and grapes Second Meal: 2 cups chicken + ranch or 3.5 servings chicken salad + crackers Snack: 2 hots dogs + cole slaw and chili Third Meal: steak, potato, broccoli or 3 cups mac n cheese Snack: 2 plums Beverages: bang keto coffee, sweet tea, water  Estimated Energy Needs Calories: 1500   NUTRITION DIAGNOSIS  Overweight/obesity ([place]-3.3) related to past poor dietary habits and physical inactivity as evidenced by patient w/ planned RYGB surgery following dietary guidelines for continued weight loss.   NUTRITION INTERVENTION  Nutrition counseling (C-1) and education (E-2) to facilitate bariatric surgery goals.  Pre-Op Goals Progress & New Goals  Breakfast ideas: 2 eggs + 1 slice of toast or 1 piece of fruit OR english muffin + egg + cheese OR peanut butter toast OR Skyr  Try drinking 1 Starbucks drink one day and then half on the other 2 days  Aim to make 4 dinners from home between now and your next appointment including non starchy vegetables without fat: broccoli, zucchini, cauliflower, greens beans, carrots, etc  NEW: take pictures of everything you put in your mouth food, drink, snacks, etc. NEW: measure out your servings using the nutrition facts label for serving size   Handouts Provided Include    Learning Style & Readiness for Change Teaching method utilized: Visual & Auditory  Demonstrated degree of  understanding via: Teach Back  Readiness Level: Action Barriers to learning/adherence to lifestyle change: stated chaos of thoughts   RD's Notes for next Visit  . Assess pts adherence to chosen goals   MONITORING & EVALUATION Dietary intake, weekly physical activity, body weight, and pre-op goals   Next Steps  Patient is to return to [facility]

## 2020-03-31 ENCOUNTER — Encounter: Payer: Self-pay | Admitting: Family Medicine

## 2020-03-31 DIAGNOSIS — K602 Anal fissure, unspecified: Secondary | ICD-10-CM | POA: Diagnosis not present

## 2020-03-31 DIAGNOSIS — K5900 Constipation, unspecified: Secondary | ICD-10-CM | POA: Diagnosis not present

## 2020-04-06 ENCOUNTER — Ambulatory Visit: Payer: 59 | Admitting: Professional

## 2020-04-07 ENCOUNTER — Telehealth: Payer: Self-pay

## 2020-04-07 NOTE — Telephone Encounter (Signed)
   [facility] Pre-operative Risk Assessment    Request for surgical clearance:  1. What type of surgery is being performed? BARIATRIC SURGERY   2. When is this surgery scheduled? TBD   3. What type of clearance is required (medical clearance vs. Pharmacy clearance to hold med vs. Both)? MEDICAL  4. Are there any medications that need to be held prior to surgery and how long? NONE   5. Practice name and name of physician performing surgery? [facility] DR ERIC WILSON  ATTN: TANISHA   6. What is the office phone number? [phone]   7.   What is the office fax number? [phone]  8.   Anesthesia type (None, local, MAC, general) ? GENERAL

## 2020-04-07 NOTE — Telephone Encounter (Signed)
Pt agreeable to plan of care for pre op appt. Pt has been scheduled to see Berniece Salines, DO 04/13/20 @ 8:20 am. Pt thanked me for the call and the help. I will send FYI to requesting pt has appt. Will remove from the pre op call back pool.

## 2020-04-07 NOTE — Telephone Encounter (Signed)
   Primary Cardiologist: Jenean Lindau, MD  Chart reviewed as part of pre-operative protocol coverage. Because of [name]'s past medical history and time since last visit, she will require a follow-up visit in order to better assess preoperative cardiovascular risk.  Pre-op covering staff: - Please schedule appointment and call patient to inform them. If patient already had an upcoming appointment within acceptable timeframe, please add "pre-op clearance" to the appointment notes so provider is aware. - Please contact requesting surgeon's office via preferred method (i.e, phone, fax) to inform them of need for appointment prior to surgery.  If applicable, this message will also be routed to pharmacy pool and/or primary cardiologist for input on holding anticoagulant/antiplatelet agent as requested below so that this information is available to the clearing provider at time of patient's appointment.   She reports not being able to do activities that she used to be able to do without stopping - this is new since her stress test in Jan 2021.  [place], PA  04/07/2020, 4:50 PM

## 2020-04-08 ENCOUNTER — Other Ambulatory Visit (HOSPITAL_COMMUNITY): Payer: Self-pay | Admitting: Physician Assistant

## 2020-04-09 ENCOUNTER — Ambulatory Visit (INDEPENDENT_AMBULATORY_CARE_PROVIDER_SITE_OTHER): Payer: 59 | Admitting: Professional

## 2020-04-09 DIAGNOSIS — F509 Eating disorder, unspecified: Secondary | ICD-10-CM | POA: Diagnosis not present

## 2020-04-09 DIAGNOSIS — F411 Generalized anxiety disorder: Secondary | ICD-10-CM | POA: Diagnosis not present

## 2020-04-09 MED FILL — VIT D2 1.25 MG (50,000 UNIT: 1.25 MG | 28 days supply | Qty: 4 | Fill #4

## 2020-04-12 ENCOUNTER — Other Ambulatory Visit: Payer: Self-pay

## 2020-04-12 DIAGNOSIS — D259 Leiomyoma of uterus, unspecified: Secondary | ICD-10-CM | POA: Insufficient documentation

## 2020-04-12 DIAGNOSIS — M5417 Radiculopathy, lumbosacral region: Secondary | ICD-10-CM | POA: Insufficient documentation

## 2020-04-12 DIAGNOSIS — I10 Essential (primary) hypertension: Secondary | ICD-10-CM | POA: Insufficient documentation

## 2020-04-12 DIAGNOSIS — Z800 Family history of malignant neoplasm of digestive organs: Secondary | ICD-10-CM | POA: Insufficient documentation

## 2020-04-12 DIAGNOSIS — R490 Dysphonia: Secondary | ICD-10-CM | POA: Insufficient documentation

## 2020-04-12 DIAGNOSIS — K6289 Other specified diseases of anus and rectum: Secondary | ICD-10-CM | POA: Insufficient documentation

## 2020-04-12 DIAGNOSIS — J310 Chronic rhinitis: Secondary | ICD-10-CM | POA: Insufficient documentation

## 2020-04-12 DIAGNOSIS — R12 Heartburn: Secondary | ICD-10-CM | POA: Insufficient documentation

## 2020-04-12 DIAGNOSIS — E079 Disorder of thyroid, unspecified: Secondary | ICD-10-CM | POA: Insufficient documentation

## 2020-04-12 DIAGNOSIS — F329 Major depressive disorder, single episode, unspecified: Secondary | ICD-10-CM | POA: Insufficient documentation

## 2020-04-12 DIAGNOSIS — J329 Chronic sinusitis, unspecified: Secondary | ICD-10-CM | POA: Insufficient documentation

## 2020-04-12 DIAGNOSIS — L290 Pruritus ani: Secondary | ICD-10-CM | POA: Insufficient documentation

## 2020-04-12 DIAGNOSIS — K219 Gastro-esophageal reflux disease without esophagitis: Secondary | ICD-10-CM | POA: Insufficient documentation

## 2020-04-12 DIAGNOSIS — E7800 Pure hypercholesterolemia, unspecified: Secondary | ICD-10-CM | POA: Insufficient documentation

## 2020-04-12 DIAGNOSIS — F419 Anxiety disorder, unspecified: Secondary | ICD-10-CM | POA: Insufficient documentation

## 2020-04-12 DIAGNOSIS — J382 Nodules of vocal cords: Secondary | ICD-10-CM | POA: Insufficient documentation

## 2020-04-12 DIAGNOSIS — F32A Depression, unspecified: Secondary | ICD-10-CM | POA: Insufficient documentation

## 2020-04-12 DIAGNOSIS — K635 Polyp of colon: Secondary | ICD-10-CM | POA: Insufficient documentation

## 2020-04-12 DIAGNOSIS — R1032 Left lower quadrant pain: Secondary | ICD-10-CM | POA: Insufficient documentation

## 2020-04-12 DIAGNOSIS — R1013 Epigastric pain: Secondary | ICD-10-CM | POA: Insufficient documentation

## 2020-04-12 DIAGNOSIS — E785 Hyperlipidemia, unspecified: Secondary | ICD-10-CM | POA: Insufficient documentation

## 2020-04-12 DIAGNOSIS — M5414 Radiculopathy, thoracic region: Secondary | ICD-10-CM | POA: Insufficient documentation

## 2020-04-12 NOTE — Telephone Encounter (Signed)
email sent from patient   [name], [name] I am having Weight Loss Surgery next year, Dr. [name] with [facility] will be doing my surgery I am having the Bypass.  I just sending you this message, because I have not seen Dr. Halford Chessman in a while and I have seen you, do you have any concerns about my choice, to have surgery. Thanks   [name] please advise.

## 2020-04-13 ENCOUNTER — Other Ambulatory Visit: Payer: Self-pay

## 2020-04-13 ENCOUNTER — Encounter: Payer: Self-pay | Admitting: Cardiology

## 2020-04-13 ENCOUNTER — Ambulatory Visit (INDEPENDENT_AMBULATORY_CARE_PROVIDER_SITE_OTHER): Payer: 59 | Admitting: Cardiology

## 2020-04-13 VITALS — BP 142/84 | HR 74 | Ht 64.5 in | Wt 253.0 lb

## 2020-04-13 DIAGNOSIS — R079 Chest pain, unspecified: Secondary | ICD-10-CM | POA: Diagnosis not present

## 2020-04-13 DIAGNOSIS — R072 Precordial pain: Secondary | ICD-10-CM

## 2020-04-13 DIAGNOSIS — G4733 Obstructive sleep apnea (adult) (pediatric): Secondary | ICD-10-CM

## 2020-04-13 DIAGNOSIS — E782 Mixed hyperlipidemia: Secondary | ICD-10-CM | POA: Diagnosis not present

## 2020-04-13 DIAGNOSIS — I10 Essential (primary) hypertension: Secondary | ICD-10-CM | POA: Diagnosis not present

## 2020-04-13 NOTE — Patient Instructions (Addendum)
Medication Instructions:  Your physician recommends that you continue on your current medications as directed. Please refer to the Current Medication list given to you today.  *If you need a refill on your cardiac medications before your next appointment, please call your pharmacy*   Lab Work: Your physician recommends that you return for lab work in: BMP to be done 1 week prior to your CT scan. You can come Monday through Friday 8:30 am to 12:00 pm and 1:15 to 4:30. You do not need to make an appointment as the order has already been placed.    If you have labs (blood work) drawn today and your tests are completely normal, you will receive your results only by: Marland Kitchen MyChart Message (if you have MyChart) OR . A paper copy in the mail If you have any lab test that is abnormal or we need to change your treatment, we will call you to review the results.   Testing/Procedures: Your cardiac CT will be scheduled at:   [facility] [address] [place], [place] [address] [phone]  If scheduled at [facility], please arrive at the [facility] main entrance of [facility] 30 minutes prior to test start time. Proceed to the [facility] Radiology Department (first floor) to check-in and test prep.  Please follow these instructions carefully (unless otherwise directed):   On the Night Before the Test: . Be sure to Drink plenty of water. . Do not consume any caffeinated/decaffeinated beverages or chocolate 12 hours prior to your test. . Do not take any antihistamines 12 hours prior to your test. . If the patient has contrast allergy: .  On the Day of the Test: . Drink plenty of water. Do not drink any water within one hour of the test. . Do not eat any food 4 hours prior to the test. . You may take your regular medications prior to the test.  . Take metoprolol (Toprol XL) two hours prior to test. . HOLD Furosemide/Hydrochlorothiazide morning of the  test. . FEMALES- please wear underwire-free bra if available.      After the Test: . Drink plenty of water. . After receiving IV contrast, you may experience a mild flushed feeling. This is normal. . On occasion, you may experience a mild rash up to 24 hours after the test. This is not dangerous. If this occurs, you can take Benadryl 25 mg and increase your fluid intake. . If you experience trouble breathing, this can be serious. If it is severe call 911 IMMEDIATELY. If it is mild, please call our office. . If you take any of these medications: Glipizide/Metformin, Avandament, Glucavance, please do not take 48 hours after completing test unless otherwise instructed.   Once we have confirmed authorization from your insurance company, we will call you to set up a date and time for your test. Based on how quickly your insurance processes prior authorizations requests, please allow up to 4 weeks to be contacted for scheduling your Cardiac CT appointment. Be advised that routine Cardiac CT appointments could be scheduled as many as 8 weeks after your provider has ordered it.  For non-scheduling related questions, please contact the cardiac imaging nurse navigator should you have any questions/concerns: Marchia Bond, Cardiac Imaging Nurse Navigator Burley Saver, Interim Cardiac Imaging Nurse [place] [facility] Direct Office Dial: [phone]   For scheduling needs, including cancellations and rescheduling, please call [place], [phone].    Follow-Up: At [facility],  you and your health needs are our priority.  As part of our continuing mission to provide you with exceptional heart care, we have created designated Provider Care Teams.  These Care Teams include your primary Cardiologist (physician) and Advanced Practice Providers (APPs -  Physician Assistants and Nurse Practitioners) who all work together to provide you with the care you need, when you need  it.  We recommend signing up for the patient portal called "MyChart".  Sign up information is provided on this After Visit Summary.  MyChart is used to connect with patients for Virtual Visits (Telemedicine).  Patients are able to view lab/test results, encounter notes, upcoming appointments, etc.  Non-urgent messages can be sent to your provider as well.   To learn more about what you can do with MyChart, go to [email or web address].    Your next appointment:   3 month(s)  The format for your next appointment:   In Person  Provider:   Berniece Salines, DO   Other Instructions  Cardiac CT Angiogram A cardiac CT angiogram is a procedure to look at the heart and the area around the heart. It may be done to help find the cause of chest pains or other symptoms of heart disease. During this procedure, a substance called contrast dye is injected into the blood vessels in the area to be checked. A large X-ray machine, called a CT scanner, then takes detailed pictures of the heart and the surrounding area. The procedure is also sometimes called a coronary CT angiogram, coronary artery scanning, or CTA. A cardiac CT angiogram allows the health care provider to see how well blood is flowing to and from the heart. The health care provider will be able to see if there are any problems, such as:  Blockage or narrowing of the coronary arteries in the heart.  Fluid around the heart.  Signs of weakness or disease in the muscles, valves, and tissues of the heart. Tell a health care provider about:  Any allergies you have. This is especially important if you have had a previous allergic reaction to contrast dye.  All medicines you are taking, including vitamins, herbs, eye drops, creams, and over-the-counter medicines.  Any blood disorders you have.  Any surgeries you have had.  Any medical conditions you have.  Whether you are pregnant or may be pregnant.  Any anxiety disorders, chronic pain, or  other conditions you have that may increase your stress or prevent you from lying still. What are the risks? Generally, this is a safe procedure. However, problems may occur, including:  Bleeding.  Infection.  Allergic reactions to medicines or dyes.  Damage to other structures or organs.  Kidney damage from the contrast dye that is used.  Increased risk of cancer from radiation exposure. This risk is low. Talk with your health care provider about: ? The risks and benefits of testing. ? How you can receive the lowest dose of radiation. What happens before the procedure?  Wear comfortable clothing and remove any jewelry, glasses, dentures, and hearing aids.  Follow instructions from your health care provider about eating and drinking. This may include: ? For 12 hours before the procedure -- avoid caffeine. This includes tea, coffee, soda, energy drinks, and diet pills. Drink plenty of water or other fluids that do not have caffeine in them. Being well hydrated can prevent complications. ? For 4-6 hours before the procedure -- stop eating and drinking. The contrast dye can cause nausea, but this is less likely  if your stomach is empty.  Ask your health care provider about changing or stopping your regular medicines. This is especially important if you are taking diabetes medicines, blood thinners, or medicines to treat problems with erections (erectile dysfunction). What happens during the procedure?   Hair on your chest may need to be removed so that small sticky patches called electrodes can be placed on your chest. These will transmit information that helps to monitor your heart during the procedure.  An IV will be inserted into one of your veins.  You might be given a medicine to control your heart rate during the procedure. This will help to ensure that good images are obtained.  You will be asked to lie on an exam table. This table will slide in and out of the CT machine during  the procedure.  Contrast dye will be injected into the IV. You might feel warm, or you may get a metallic taste in your mouth.  You will be given a medicine called nitroglycerin. This will relax or dilate the arteries in your heart.  The table that you are lying on will move into the CT machine tunnel for the scan.  The person running the machine will give you instructions while the scans are being done. You may be asked to: ? Keep your arms above your head. ? Hold your breath. ? Stay very still, even if the table is moving.  When the scanning is complete, you will be moved out of the machine.  The IV will be removed. The procedure may vary among health care providers and hospitals. What can I expect after the procedure? After your procedure, it is common to have:  A metallic taste in your mouth from the contrast dye.  A feeling of warmth.  A headache from the nitroglycerin. Follow these instructions at home:  Take over-the-counter and prescription medicines only as told by your health care provider.  If you are told, drink enough fluid to keep your urine pale yellow. This will help to flush the contrast dye out of your body.  Most people can return to their normal activities right after the procedure. Ask your health care provider what activities are safe for you.  It is up to you to get the results of your procedure. Ask your health care provider, or the department that is doing the procedure, when your results will be ready.  Keep all follow-up visits as told by your health care provider. This is important. Contact a health care provider if:  You have any symptoms of allergy to the contrast dye. These include: ? Shortness of breath. ? Rash or hives. ? A racing heartbeat. Summary  A cardiac CT angiogram is a procedure to look at the heart and the area around the heart. It may be done to help find the cause of chest pains or other symptoms of heart disease.  During this  procedure, a large X-ray machine, called a CT scanner, takes detailed pictures of the heart and the surrounding area after a contrast dye has been injected into blood vessels in the area.  Ask your health care provider about changing or stopping your regular medicines before the procedure. This is especially important if you are taking diabetes medicines, blood thinners, or medicines to treat erectile dysfunction.  If you are told, drink enough fluid to keep your urine pale yellow. This will help to flush the contrast dye out of your body. This information is not intended to replace  advice given to you by your health care provider. Make sure you discuss any questions you have with your health care provider. Document Revised: 12/04/2018 Document Reviewed: 12/04/2018 Elsevier Patient Education  [place].

## 2020-04-13 NOTE — Progress Notes (Signed)
Cardiology Office Note:    Date:  04/15/2020   ID:  [name], DOB July 26, 1960, MRN [id number]  PCP:  Gina Lukes, MD  Cardiologist:  Gina Lindau, MD  Electrophysiologist:  None   Referring MD: Gina Lukes, MD   Chief Complaint  Patient presents with  . Medical Clearance    History of Present Illness:    Gina BHATTACHARYYA is a 59 y.o. female with a hx of hypertension, prediabetes, hyperlipidemia, family history of premature coronary artery disease who is here today to establish cardiac care.  The patient also tells me that she is pending surgery/bariatric surgery.  In part of her visit is looking forward to clearing for her surgery. But she tells me she has been experiencing intermittent chest discomfort which is sometimes on the upper right side.  He has at one point went up her shoulder and down her arms.  But not often.  She is concerned about this.  But however she is also looking forward to her upcoming surgery.  She does not perform a lot of physical activity due to her muscle weakness.  No other concerns at this time.  No lightheadedness no dizziness  Past Medical History:  Diagnosis Date  . Acute bronchitis 05/25/2016  . Anxiety   . Chronic pain syndrome 01/06/2013  . Chronic rhinosinusitis   . Colon polyps   . Cough 01/06/2013  . Depression   . Diabetes ([place]) 01/06/2013  . Diabetes mellitus type 2 in obese [facility]) 01/06/2013   Sees Dr Calvert Cantor for eye exam Does not see podiatry, foot exam today unremarkable except for thick cracking skin on heals   . Dysuria 05/25/2016  . Edema 01/06/2013  . Epistaxis 05/25/2016  . Fibroid, uterine   . GERD (gastroesophageal reflux disease)   . Glaucoma 12-13  . Hoarseness   . Hoarseness of voice 05/11/2013  . Hyperglycemia 04/13/2013  . Hyperlipemia, mixed 06/06/2007   Qualifier: Diagnosis of  By: Wynona Luna She feels Lipitor caused increased low back pain and weakness    . Hyperlipidemia   . Hypertension   .  Hypokalemia 02/05/2013  . IBS (irritable bowel syndrome) 02/13/2011  . Low back pain 03/03/2013  . Muscle cramp 05/22/2014  . Obesity   . Obesity, unspecified 05/11/2013  . OSA (obstructive sleep apnea)   . Pain in joint, shoulder region 06/27/2015  . Perimenopause 10/05/2012  . Raynaud disease 06/16/2013  . Thyroid disease    hypothyroidism  . Vocal fold nodules     Past Surgical History:  Procedure Laterality Date  . ABDOMINAL HYSTERECTOMY    . BREAST EXCISIONAL BIOPSY Right    at age 35  . CHOLECYSTECTOMY    . CYSTECTOMY    . DILATION AND CURETTAGE OF UTERUS     x2  . I & D EXTREMITY Left 04/11/2018   Procedure: DEBIDMENT DISTAL INTERPHALANGEAL LEFT MIDDLE;  Surgeon: Daryll Brod, MD;  Location: [facility];  Service: Orthopedics;  Laterality: Left;  Marland Kitchen MASS EXCISION Left 04/11/2018   Procedure: EXCISION MASS;  Surgeon: Daryll Brod, MD;  Location: [facility];  Service: Orthopedics;  Laterality: Left;  . OOPHORECTOMY    . POLYPECTOMY    . ROTATOR CUFF REPAIR      Current Medications: Current Meds  Medication Sig  . acetaminophen (TYLENOL) 325 MG tablet Take 650 mg by mouth every 6 (six) hours as needed.  Marland Kitchen albuterol (VENTOLIN HFA) 108 (90 Base) MCG/ACT inhaler  Inhale 2 puffs into the lungs every 4 (four) hours as needed for wheezing or shortness of breath.  [name] THYROID 90 MG tablet Take 90 mg by mouth daily.  Marland Kitchen aspirin EC 81 MG tablet Take 1 tablet (81 mg total) by mouth daily.  . Azelaic Acid 15 % cream SMARTSIG:Sparingly Topical Twice Daily  . Cod Liver Oil 1000 MG CAPS Take 1 capsule by mouth daily.  Marland Kitchen dexlansoprazole (DEXILANT) 60 MG capsule 30 mg.  . diphenhydrAMINE (BENADRYL) 25 MG tablet Take 25 mg by mouth every 6 (six) hours as needed.  . diphenhydrAMINE (SOMINEX) 25 MG tablet Take by mouth.  . ezetimibe (ZETIA) 10 MG tablet Take 1 tablet (10 mg total) by mouth daily.  . furosemide (LASIX) 20 MG tablet TAKE 1 TABLET (20 MG TOTAL) BY  MOUTH DAILY AS NEEDED.  Marland Kitchen gabapentin (NEURONTIN) 100 MG capsule Take 2 capsules (200 mg total) by mouth 2 (two) times daily.  Marland Kitchen gabapentin (NEURONTIN) 300 MG capsule Take 1-2 capsules (300-600 mg total) by mouth at bedtime.  . hyoscyamine (LEVSIN SL) 0.125 MG SL tablet Place 1 tablet (0.125 mg total) under the tongue every 4 (four) hours as needed.  Marland Kitchen ipratropium (ATROVENT) 0.06 % nasal spray Place 2 sprays into the nose 3 (three) times daily as needed.  . loratadine (CLARITIN) 10 MG tablet Take 10 mg by mouth daily.  . metFORMIN (GLUCOPHAGE) 1000 MG tablet Take 0.5 tablets (500 mg total) by mouth daily with breakfast.  . metoprolol succinate (TOPROL-XL) 100 MG 24 hr tablet Take 1 tablet (100 mg total) by mouth daily. Take with or immediately following a meal. (Patient taking differently: Take 100 mg by mouth daily. Take $RemoveBef'50mg'jDiUpAPmcJ$  in morning and $RemoveBef'100mg'IfRVolwZKJ$  at bedtime)  . Milnacipran (SAVELLA) 50 MG TABS tablet Take 1 tablet (50 mg total) by mouth 2 (two) times daily.  . Milnacipran HCl (SAVELLA TITRATION PACK) 12.5 & 25 & 50 MG MISC Take 12.5 mg by mouth daily for 1 day, THEN 12.5 mg in the morning and at bedtime for 2 days, THEN 25 mg in the morning and at bedtime for 4 days, THEN 50 mg in the morning and at bedtime for 21 days.  . modafinil (PROVIGIL) 200 MG tablet Take 2 tablets (400 mg total) by mouth daily.  . Prenatal Vit-Fe Fumarate-FA (M-VIT PO) Take by mouth.  . spironolactone (ALDACTONE) 50 MG tablet Take 1 tablet (50 mg total) by mouth 3 (three) times daily.  . TRULANCE 3 MG TABS Take 1 tablet by mouth daily.  . Vitamin D, Ergocalciferol, (DRISDOL) 1.25 MG (50000 UNIT) CAPS capsule Take 1 capsule (50,000 Units total) by mouth every 7 (seven) days.  . [DISCONTINUED] fenofibrate micronized (LOFIBRA) 134 MG capsule 1 capsule with a meal     Allergies:   Crestor [rosuvastatin], Losartan, Wellbutrin [bupropion], Atorvastatin, Codeine, Amoxicillin, Aspirin, Erythromycin, and Penicillins   Social  History   Socioeconomic History  . Marital status: Significant Other    Spouse name: Not on file  . Number of children: 0  . Years of education: Not on file  . Highest education level: Not on file  Occupational History  . 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 Smoking status: Former Smoker    Packs/day: 0.75    Years: 42.00    Pack years: 31.50    Types: Cigarettes    Quit date: 04/24/2005    Years since quitting: 14.9  . Smokeless tobacco: Never Used  . Tobacco comment: smoked since  age 70  Vaping Use  . Vaping Use: Never used  Substance and Sexual Activity  . Alcohol use: No  . Drug use: Never  . Sexual activity: Yes    Partners: Male  Other Topics Concern  . Not on file  Social History Narrative   Endo-- Dr Dwyane Dee   ENT--Dr shoemaker   GI--Dr Coweta   Pulm--Dr Clance   Rheum--Dr Charlestine Night         Social Determinants of Health   Financial Resource Strain: Not on file  Food Insecurity: Not on file  Transportation Needs: Not on file  Physical Activity: Not on file  Stress: Not on file  Social Connections: Not on file     Family History: The patient's family history includes Alcohol abuse in her father; Anxiety disorder in her mother; Breast cancer (age of onset: 62) in an other family member; Cancer in her father and paternal grandfather; Depression in her mother; Diabetes in her brother, sister, and another family member; Heart disease in her father; High Cholesterol in her mother; High blood pressure in her mother; Hyperlipidemia in her father; Hypertension in her father; Kidney disease in her mother; Obesity in her mother; Stroke in her father.  ROS:   Review of Systems  Constitution: Negative for decreased appetite, fever and weight gain.  HENT: Negative for congestion, ear discharge, hoarse voice and sore throat.   Eyes: Negative for discharge, redness, vision loss in right eye and visual halos.  Cardiovascular: Negative for chest pain, dyspnea on  exertion, leg swelling, orthopnea and palpitations.  Respiratory: Negative for cough, hemoptysis, shortness of breath and snoring.   Endocrine: Negative for heat intolerance and polyphagia.  Hematologic/Lymphatic: Negative for bleeding problem. Does not bruise/bleed easily.  Skin: Negative for flushing, nail changes, rash and suspicious lesions.  Musculoskeletal: Negative for arthritis, joint pain, muscle cramps, myalgias, neck pain and stiffness.  Gastrointestinal: Negative for abdominal pain, bowel incontinence, diarrhea and excessive appetite.  Genitourinary: Negative for decreased libido, genital sores and incomplete emptying.  Neurological: Negative for brief paralysis, focal weakness, headaches and loss of balance.  Psychiatric/Behavioral: Negative for altered mental status, depression and suicidal ideas.  Allergic/Immunologic: Negative for HIV exposure and persistent infections.    EKGs/Labs/Other Studies Reviewed:    The following studies were reviewed today:   EKG:  The ekg ordered today demonstrates   Transthoracic echocardiogram in January 2021 IMPRESSIONS    1. Left ventricular ejection fraction, by visual estimation, is 60 to  65%. The left ventricle has normal function. There is mildly increased  left ventricular hypertrophy.  2. The left ventricle has no regional wall motion abnormalities.  3. Global right ventricle has normal systolic function.The right  ventricular size is normal. No increase in right ventricular wall  thickness.  4. Left atrial size was normal.  5. Right atrial size was normal.  6. The mitral valve is normal in structure. No evidence of mitral valve  regurgitation. No evidence of mitral stenosis.  7. The tricuspid valve is normal in structure.  8. The aortic valve is normal in structure. Aortic valve regurgitation is  not visualized. No evidence of aortic valve sclerosis or stenosis.  9. The pulmonic valve was normal in structure.  Pulmonic valve  regurgitation is not visualized.  10. Unable to determine Pulmonary artery systolic pressure, No TR jet  spectral display.   Nuclear stress test in January 2021  Nuclear stress EF: 61%. No wall motion abnormalities  There was no ST segment deviation noted during  stress.  This is a low risk study. No ischemia identified   Candee Furbish, MD   Recent Labs: 02/13/2020: ALT 21; BUN 12; Creat 0.83; Hemoglobin 14.1; Platelets 343; Potassium 4.5; Sodium 139; TSH 1.02  Recent Lipid Panel    Component Value Date/Time   CHOL 214 (H) 02/13/2020 0857   CHOL 210 (H) 07/03/2017 1002   TRIG 182 (H) 02/13/2020 0857   HDL 44 (L) 02/13/2020 0857   HDL 47 07/03/2017 1002   CHOLHDL 4.9 02/13/2020 0857   VLDL 43.2 (H) 11/11/2019 1125   LDLCALC 138 (H) 02/13/2020 0857   LDLDIRECT 159.0 11/11/2019 1125    Physical Exam:    VS:  BP (!) 142/84 (BP Location: Right Arm, Patient Position: Sitting)   Pulse 74   Ht 5' 4.5" (1.638 m)   Wt 253 lb (114.8 kg)   SpO2 94%   BMI 42.76 kg/m     Wt Readings from Last 3 Encounters:  04/13/20 253 lb (114.8 kg)  03/30/20 258 lb (117 kg)  03/16/20 257 lb (116.6 kg)     GEN: Well nourished, well developed in no acute distress HEENT: Normal NECK: No JVD; No carotid bruits LYMPHATICS: No lymphadenopathy CARDIAC: S1S2 noted,RRR, no murmurs, rubs, gallops RESPIRATORY:  Clear to auscultation without rales, wheezing or rhonchi  ABDOMEN: Soft, non-tender, non-distended, +bowel sounds, no guarding. EXTREMITIES: No edema, No cyanosis, no clubbing MUSCULOSKELETAL:  No deformity  SKIN: Warm and dry NEUROLOGIC:  Alert and oriented x 3, non-focal PSYCHIATRIC:  Normal affect, good insight  ASSESSMENT:    1. Benign essential hypertension   2. Chest pain, unspecified type   3. Precordial pain   4. OSA (obstructive sleep apnea)   5. Hyperlipemia, mixed   6. Morbid obesity ([place])    PLAN:     She still is experiencing chest pain despite her  negative nuclear stress test.  As well as a 0 calcium score.  I am concerned as her symptoms progresses and her risk factors increases she has prediabetes hypertension hyperlipidemia premature family history, premature menopause and obesity.  I would really like to give a definitive test to this patient to understand any factor of coronary artery disease.  A coronary CTA would be appropriate in this case as it would rule out CAD by ruling out soft plaques as well which would give [place] great understanding of the patient's symptoms risk factors and probability of her disease state.  We have talked about this in great detail during the visit today she is agreeable to proceed.  She has no IV contrast dye allergy.  We reviewed her prior echocardiogram there is no need for repeat here.  Hypertension her blood pressure is slightly elevated in the office today her goal is less than 130/80.  But she tells me this is an anomaly as she has been monitor her blood pressure has been much improved.  What I like to do we have asked the patient take her blood pressure daily and send this information to me so we can be able to review and make necessary changes as appropriate.  Hyperlipidemia she is on Zetia, and has had statin intolerance.  We will like to get a lipoprotein A, once the patient is agreeable we can start her on a low potent statin like pravastatin to see if any issues occur.  But this will be discussed again at a later time.  The patient understands the need to lose weight with diet and exercise. We have discussed specific  strategies for this.   The patient is in agreement with the above plan. The patient left the office in stable condition.  The patient will follow up in 3 months or sooner if needed.   Medication Adjustments/Labs and Tests Ordered: Current medicines are reviewed at length with the patient today.  Concerns regarding medicines are outlined above.  Orders Placed This Encounter  Procedures   . CT CORONARY MORPH W/CTA COR W/SCORE W/CA W/CM &/OR WO/CM  . CT CORONARY FRACTIONAL FLOW RESERVE DATA PREP  . CT CORONARY FRACTIONAL FLOW RESERVE FLUID ANALYSIS  . Basic metabolic panel   No orders of the defined types were placed in this encounter.   Patient Instructions  Medication Instructions:  Your physician recommends that you continue on your current medications as directed. Please refer to the Current Medication list given to you today.  *If you need a refill on your cardiac medications before your next appointment, please call your pharmacy*   Lab Work: Your physician recommends that you return for lab work in: BMP to be done 1 week prior to your CT scan. You can come Monday through Friday 8:30 am to 12:00 pm and 1:15 to 4:30. You do not need to make an appointment as the order has already been placed.    If you have labs (blood work) drawn today and your tests are completely normal, you will receive your results only by: Marland Kitchen MyChart Message (if you have MyChart) OR . A paper copy in the mail If you have any lab test that is abnormal or we need to change your treatment, we will call you to review the results.   Testing/Procedures: Your cardiac CT will be scheduled at:   [facility] [address] [place], [place] [address] [phone]  If scheduled at [facility], please arrive at the [facility] main entrance of [facility] 30 minutes prior to test start time. Proceed to the [facility] Radiology Department (first floor) to check-in and test prep.  Please follow these instructions carefully (unless otherwise directed):   On the Night Before the Test: . Be sure to Drink plenty of water. . Do not consume any caffeinated/decaffeinated beverages or chocolate 12 hours prior to your test. . Do not take any antihistamines 12 hours prior to your test. . If the patient has contrast allergy: .  On the Day of the Test: . Drink plenty of  water. Do not drink any water within one hour of the test. . Do not eat any food 4 hours prior to the test. . You may take your regular medications prior to the test.  . Take metoprolol (Toprol XL) two hours prior to test. . HOLD Furosemide/Hydrochlorothiazide morning of the test. . FEMALES- please wear underwire-free bra if available.      After the Test: . Drink plenty of water. . After receiving IV contrast, you may experience a mild flushed feeling. This is normal. . On occasion, you may experience a mild rash up to 24 hours after the test. This is not dangerous. If this occurs, you can take Benadryl 25 mg and increase your fluid intake. . If you experience trouble breathing, this can be serious. If it is severe call 911 IMMEDIATELY. If it is mild, please call our office. . If you take any of these medications: Glipizide/Metformin, Avandament, Glucavance, please do not take 48 hours after completing test unless otherwise instructed.   Once we have confirmed authorization from [employer],  we will call you to set up a date and time for your test. Based on how quickly your insurance processes prior authorizations requests, please allow up to 4 weeks to be contacted for scheduling your Cardiac CT appointment. Be advised that routine Cardiac CT appointments could be scheduled as many as 8 weeks after your provider has ordered it.  For non-scheduling related questions, please contact the cardiac imaging nurse navigator should you have any questions/concerns: Marchia Bond, Cardiac Imaging Nurse Navigator Burley Saver, Interim Cardiac Imaging Nurse [place] [facility] Services Direct Office Dial: [phone]   For scheduling needs, including cancellations and rescheduling, please call [place], [phone].    Follow-Up: At [facility], you and your health needs are our priority.  As part of our continuing mission to provide you with exceptional heart care,  we have created designated Provider Care Teams.  These Care Teams include your primary Cardiologist (physician) and Advanced Practice Providers (APPs -  Physician Assistants and Nurse Practitioners) who all work together to provide you with the care you need, when you need it.  We recommend signing up for the patient portal called "MyChart".  Sign up information is provided on this After Visit Summary.  MyChart is used to connect with patients for Virtual Visits (Telemedicine).  Patients are able to view lab/test results, encounter notes, upcoming appointments, etc.  Non-urgent messages can be sent to your provider as well.   To learn more about what you can do with MyChart, go to [email or web address].    Your next appointment:   3 month(s)  The format for your next appointment:   In Person  Provider:   Berniece Salines, DO   Other Instructions  Cardiac CT Angiogram A cardiac CT angiogram is a procedure to look at the heart and the area around the heart. It may be done to help find the cause of chest pains or other symptoms of heart disease. During this procedure, a substance called contrast dye is injected into the blood vessels in the area to be checked. A large X-ray machine, called a CT scanner, then takes detailed pictures of the heart and the surrounding area. The procedure is also sometimes called a coronary CT angiogram, coronary artery scanning, or CTA. A cardiac CT angiogram allows the health care provider to see how well blood is flowing to and from the heart. The health care provider will be able to see if there are any problems, such as:  Blockage or narrowing of the coronary arteries in the heart.  Fluid around the heart.  Signs of weakness or disease in the muscles, valves, and tissues of the heart. Tell a health care provider about:  Any allergies you have. This is especially important if you have had a previous allergic reaction to contrast dye.  All medicines you are  taking, including vitamins, herbs, eye drops, creams, and over-the-counter medicines.  Any blood disorders you have.  Any surgeries you have had.  Any medical conditions you have.  Whether you are pregnant or may be pregnant.  Any anxiety disorders, chronic pain, or other conditions you have that may increase your stress or prevent you from lying still. What are the risks? Generally, this is a safe procedure. However, problems may occur, including:  Bleeding.  Infection.  Allergic reactions to medicines or dyes.  Damage to other structures or organs.  Kidney damage from the contrast dye that is used.  Increased risk of cancer from radiation exposure. This risk is low.  Talk with your health care provider about: ? The risks and benefits of testing. ? How you can receive the lowest dose of radiation. What happens before the procedure?  Wear comfortable clothing and remove any jewelry, glasses, dentures, and hearing aids.  Follow instructions from your health care provider about eating and drinking. This may include: ? For 12 hours before the procedure - avoid caffeine. This includes tea, coffee, soda, energy drinks, and diet pills. Drink plenty of water or other fluids that do not have caffeine in them. Being well hydrated can prevent complications. ? For 4-6 hours before the procedure - stop eating and drinking. The contrast dye can cause nausea, but this is less likely if your stomach is empty.  Ask your health care provider about changing or stopping your regular medicines. This is especially important if you are taking diabetes medicines, blood thinners, or medicines to treat problems with erections (erectile dysfunction). What happens during the procedure?   Hair on your chest may need to be removed so that small sticky patches called electrodes can be placed on your chest. These will transmit information that helps to monitor your heart during the procedure.  An IV will be  inserted into one of your veins.  You might be given a medicine to control your heart rate during the procedure. This will help to ensure that good images are obtained.  You will be asked to lie on an exam table. This table will slide in and out of the CT machine during the procedure.  Contrast dye will be injected into the IV. You might feel warm, or you may get a metallic taste in your mouth.  You will be given a medicine called nitroglycerin. This will relax or dilate the arteries in your heart.  The table that you are lying on will move into the CT machine tunnel for the scan.  The person running the machine will give you instructions while the scans are being done. You may be asked to: ? Keep your arms above your head. ? Hold your breath. ? Stay very still, even if the table is moving.  When the scanning is complete, you will be moved out of the machine.  The IV will be removed. The procedure may vary among health care providers and hospitals. What can I expect after the procedure? After your procedure, it is common to have:  A metallic taste in your mouth from the contrast dye.  A feeling of warmth.  A headache from the nitroglycerin. Follow these instructions at home:  Take over-the-counter and prescription medicines only as told by your health care provider.  If you are told, drink enough fluid to keep your urine pale yellow. This will help to flush the contrast dye out of your body.  Most people can return to their normal activities right after the procedure. Ask your health care provider what activities are safe for you.  It is up to you to get the results of your procedure. Ask your health care provider, or the department that is doing the procedure, when your results will be ready.  Keep all follow-up visits as told by your health care provider. This is important. Contact a health care provider if:  You have any symptoms of allergy to the contrast dye. These  include: ? Shortness of breath. ? Rash or hives. ? A racing heartbeat. Summary  A cardiac CT angiogram is a procedure to look at the heart and the area around the heart.  It may be done to help find the cause of chest pains or other symptoms of heart disease.  During this procedure, a large X-ray machine, called a CT scanner, takes detailed pictures of the heart and the surrounding area after a contrast dye has been injected into blood vessels in the area.  Ask your health care provider about changing or stopping your regular medicines before the procedure. This is especially important if you are taking diabetes medicines, blood thinners, or medicines to treat erectile dysfunction.  If you are told, drink enough fluid to keep your urine pale yellow. This will help to flush the contrast dye out of your body. This information is not intended to replace advice given to you by your health care provider. Make sure you discuss any questions you have with your health care provider. Document Revised: 12/04/2018 Document Reviewed: 12/04/2018 Elsevier Patient Education  [place].      Adopting a Healthy Lifestyle.  Know what a healthy weight is for you (roughly BMI <25) and aim to maintain this   Aim for 7+ servings of fruits and vegetables daily   65-80+ fluid ounces of water or unsweet tea for healthy kidneys   Limit to max 1 drink of alcohol per day; avoid smoking/tobacco   Limit animal fats in diet for cholesterol and heart health - choose grass fed whenever available   Avoid highly processed foods, and foods high in saturated/trans fats   Aim for low stress - take time to unwind and care for your mental health   Aim for 150 min of moderate intensity exercise weekly for heart health, and weights twice weekly for bone health   Aim for 7-9 hours of sleep daily   When it comes to diets, agreement about the perfect plan isnt easy to find, even among the experts. Experts at  the [employer] developed an idea known as the Healthy Eating Plate. Just imagine a plate divided into logical, healthy portions.   The emphasis is on diet quality:   Load up on vegetables and fruits - one-half of your plate: Aim for color and variety, and remember that potatoes dont count.   Go for whole grains - one-quarter of your plate: Whole wheat, barley, wheat berries, quinoa, oats, brown rice, and foods made with them. If you want pasta, go with whole wheat pasta.   Protein power - one-quarter of your plate: Fish, chicken, beans, and nuts are all healthy, versatile protein sources. Limit red meat.   The diet, however, does go beyond the plate, offering a few other suggestions.   Use healthy plant oils, such as olive, canola, soy, corn, sunflower and peanut. Check the labels, and avoid partially hydrogenated oil, which have unhealthy trans fats.   If youre thirsty, drink water. Coffee and tea are good in moderation, but skip sugary drinks and limit milk and dairy products to one or two daily servings.   The type of carbohydrate in the diet is more important than the amount. Some sources of carbohydrates, such as vegetables, fruits, whole grains, and beans-are healthier than others.   Finally, stay active  Signed, Berniece Salines, DO  04/15/2020 9:47 AM    [place]

## 2020-04-21 ENCOUNTER — Encounter: Payer: 59 | Admitting: Skilled Nursing Facility1

## 2020-04-21 ENCOUNTER — Other Ambulatory Visit: Payer: Self-pay

## 2020-04-21 DIAGNOSIS — E669 Obesity, unspecified: Secondary | ICD-10-CM | POA: Diagnosis not present

## 2020-04-21 DIAGNOSIS — E1169 Type 2 diabetes mellitus with other specified complication: Secondary | ICD-10-CM | POA: Diagnosis not present

## 2020-04-21 MED FILL — SAVELLA 50 MG TABLET: 50 | 30 days supply | Qty: 60 | Fill #0

## 2020-04-21 NOTE — Progress Notes (Signed)
Pre Surgery Follow Up Bariatric Nutrition Education  Planned Surgery: RYGB Pt Expectation of Surgery/ Goals: to lose weight   NUTRITION ASSESSMENT  Anthropometrics  Start weight at [facility]: 254.9 lbs (date: 03/02/2020)  Weight: 250.9 pounds Height: 64.5 in BMI: 42.40 kg/m2     Clinical  Medical hx: HTN, OSA, GERD, IBS, Hypothyroidism, diabetes Medications: metformin  Labs: A1C 6.4 Notable signs/symptoms: sweating, edema, hypokalemia, cannot stand or walk for long time Any previous deficiencies? Vitamin D  Lifestyle & Dietary Hx  Pt states she works 3 days a week  Pt arrives having lost about 7 pounds stating she thinks it is from not eating out and watching her portion sizes.  Pt states states she has learned to follow the serving size of hot tamals candies eating them at  About 3 times a day. Pt states in all she has been trying to follow the serving size of foods. Pt states he wonders if she should try whole wheat pasta: Dietitian advised that would be a good idea. Pt states she has bought a Lot new vegetables to try such as broccoli and sugar snap peas.  Pt has been doing a lot of label reading too. Pt state she is working on finding a psychiatrist.   Pt is in a really good place with making her changes. Pt states she has less chaos in her brain since talking things out with dietitian and therapist.   Estimated daily fluid intake: unknown oz Supplements:  Current average weekly physical activity: ADL's  24-Hr Dietary Recall First Meal 8: bagel + sausage + cheese Snack 12: protein shake and grapes or banana Second Meal: 2 cups chicken + ranch or 3.5 servings chicken salad + crackers or ceasar chicken wraps Snack: 2 hots dogs + cole slaw and chili or nothing Third Meal: steak, potato, broccoli or 3 cups mac n cheese or vegetable soup + 2 cheese toast or peanut butter sandwich Snack: 2 plums or hot tamalas Beverages:  sweet tea, water, green tea  Estimated Energy  Needs Calories: 1500   NUTRITION DIAGNOSIS  Overweight/obesity ([place]-3.3) related to past poor dietary habits and physical inactivity as evidenced by patient w/ planned RYGB surgery following dietary guidelines for continued weight loss.   NUTRITION INTERVENTION  Nutrition counseling (C-1) and education (E-2) to facilitate bariatric surgery goals.  Pre-Op Goals Progress & New Goals  Breakfast ideas: 2 eggs + 1 slice of toast or 1 piece of fruit OR english muffin + egg + cheese OR peanut butter toast OR Skyr  Try drinking 1 Starbucks drink one day and then half on the other 2 days  Aim to make 4 dinners from home between now and your next appointment including non starchy vegetables without fat: broccoli, zucchini, cauliflower, greens beans, carrots, etc take pictures of everything you put in your mouth food, drink, snacks, etc. measure out your servings using the nutrition facts label for serving size  For pasta have 1/2 cup and a non starchy vegetable  Try a wheat pasta  Handouts Provided Include    Learning Style & Readiness for Change Teaching method utilized: Visual & Auditory  Demonstrated degree of understanding via: Teach Back  Readiness Level: Action Barriers to learning/adherence to lifestyle change: stated chaos of thoughts   RD's Notes for next Visit  . Assess pts adherence to chosen goals   MONITORING & EVALUATION Dietary intake, weekly physical activity, body weight, and pre-op goals   Next Steps  Patient is to return to [facility]

## 2020-04-22 ENCOUNTER — Encounter: Payer: Self-pay | Admitting: Family Medicine

## 2020-04-22 ENCOUNTER — Other Ambulatory Visit: Payer: Self-pay | Admitting: Family Medicine

## 2020-04-22 ENCOUNTER — Ambulatory Visit (INDEPENDENT_AMBULATORY_CARE_PROVIDER_SITE_OTHER): Payer: 59 | Admitting: Professional

## 2020-04-22 DIAGNOSIS — K5901 Slow transit constipation: Secondary | ICD-10-CM

## 2020-04-22 DIAGNOSIS — G894 Chronic pain syndrome: Secondary | ICD-10-CM

## 2020-04-22 DIAGNOSIS — F411 Generalized anxiety disorder: Secondary | ICD-10-CM

## 2020-04-22 DIAGNOSIS — I7300 Raynaud's syndrome without gangrene: Secondary | ICD-10-CM

## 2020-04-22 DIAGNOSIS — R200 Anesthesia of skin: Secondary | ICD-10-CM

## 2020-04-22 DIAGNOSIS — F909 Attention-deficit hyperactivity disorder, unspecified type: Secondary | ICD-10-CM | POA: Diagnosis not present

## 2020-04-22 MED FILL — ARMOUR THYROID 90 MG TABLET: 90 | 30 days supply | Qty: 30 | Fill #1

## 2020-04-27 ENCOUNTER — Other Ambulatory Visit: Payer: Self-pay | Admitting: Cardiology

## 2020-04-27 ENCOUNTER — Other Ambulatory Visit: Payer: Self-pay

## 2020-04-27 DIAGNOSIS — R079 Chest pain, unspecified: Secondary | ICD-10-CM | POA: Diagnosis not present

## 2020-04-27 DIAGNOSIS — K5901 Slow transit constipation: Secondary | ICD-10-CM

## 2020-04-27 DIAGNOSIS — G894 Chronic pain syndrome: Secondary | ICD-10-CM

## 2020-04-27 DIAGNOSIS — R200 Anesthesia of skin: Secondary | ICD-10-CM | POA: Diagnosis not present

## 2020-04-27 DIAGNOSIS — I7300 Raynaud's syndrome without gangrene: Secondary | ICD-10-CM

## 2020-04-27 MED FILL — TRULANCE 3 MG TABS: 3 | 90 days supply | Qty: 90 | Fill #0

## 2020-04-28 ENCOUNTER — Telehealth: Payer: Self-pay

## 2020-04-28 LAB — BASIC METABOLIC PANEL
BUN/Creatinine Ratio: 12 (ref 9–23)
BUN: 11 mg/dL (ref 6–24)
CO2: 23 mmol/L (ref 20–29)
Calcium: 9.8 mg/dL (ref 8.7–10.2)
Chloride: 101 mmol/L (ref 96–106)
Creatinine, Ser: 0.92 mg/dL (ref 0.57–1.00)
GFR calc Af Amer: 79 mL/min/{1.73_m2} (ref >59)
GFR calc non Af Amer: 68 mL/min/{1.73_m2} (ref >59)
Glucose: 97 mg/dL (ref 65–99)
Potassium: 4.3 mmol/L (ref 3.5–5.2)
Sodium: 139 mmol/L (ref 134–144)

## 2020-04-28 NOTE — Telephone Encounter (Signed)
-----   Message from Thomasene Ripple, DO sent at 04/28/2020  4:26 PM EST ----- Labs normal

## 2020-04-28 NOTE — Telephone Encounter (Signed)
Patient notified of results and verbalized understanding.  

## 2020-04-30 ENCOUNTER — Telehealth (HOSPITAL_COMMUNITY): Payer: Self-pay | Admitting: *Deleted

## 2020-04-30 NOTE — Telephone Encounter (Signed)
Reaching out to patient to offer assistance regarding upcoming cardiac imaging study; pt verbalizes understanding of appt date/time, parking situation and where to check in, pre-test NPO status and medications ordered, and verified current allergies; name and call back number provided for further questions should they arise  Abbigayle Toole RN Navigator Cardiac Imaging [facility] [phone] office [phone] cell  

## 2020-05-04 ENCOUNTER — Encounter: Payer: 59 | Admitting: *Deleted

## 2020-05-04 ENCOUNTER — Ambulatory Visit (HOSPITAL_COMMUNITY)
Admission: RE | Admit: 2020-05-04 | Discharge: 2020-05-04 | Disposition: A | Discharge status: Home / Self Care | Payer: 59 | Source: Ambulatory Visit | Attending: Cardiology | Admitting: Cardiology

## 2020-05-04 ENCOUNTER — Other Ambulatory Visit: Payer: Self-pay

## 2020-05-04 ENCOUNTER — Encounter (HOSPITAL_COMMUNITY): Payer: Self-pay

## 2020-05-04 DIAGNOSIS — R079 Chest pain, unspecified: Secondary | ICD-10-CM | POA: Insufficient documentation

## 2020-05-04 DIAGNOSIS — R072 Precordial pain: Secondary | ICD-10-CM | POA: Insufficient documentation

## 2020-05-04 DIAGNOSIS — Z006 Encounter for examination for normal comparison and control in clinical research program: Secondary | ICD-10-CM

## 2020-05-04 MED ORDER — DILTIAZEM HCL 25 MG/5ML IV SOLN
5.0000 mg | Freq: Once | INTRAVENOUS | Status: AC
  Administered 2020-05-04: 5 mg via INTRAVENOUS
  Filled 2020-05-04: qty 5

## 2020-05-04 MED ORDER — METOPROLOL TARTRATE 5 MG/5ML IV SOLN
INTRAVENOUS | Status: AC
  Administered 2020-05-04: 5 mg via INTRAVENOUS
  Filled 2020-05-04: qty 20

## 2020-05-04 MED ORDER — DILTIAZEM HCL 25 MG/5ML IV SOLN
INTRAVENOUS | Status: AC
  Administered 2020-05-04: 5 mg via INTRAVENOUS
  Filled 2020-05-04: qty 5

## 2020-05-04 MED ORDER — IOHEXOL 350 MG/ML SOLN
80.0000 mL | Freq: Once | INTRAVENOUS | Status: AC | PRN
  Administered 2020-05-04: 80 mL via INTRAVENOUS

## 2020-05-04 MED ORDER — NITROGLYCERIN 0.4 MG SL SUBL: 0.8000 mg | SUBLINGUAL_TABLET | Freq: Once | SUBLINGUAL | Status: DC

## 2020-05-04 MED ORDER — DILTIAZEM HCL 25 MG/5ML IV SOLN
5.0000 mg | Freq: Once | INTRAVENOUS | Status: AC
  Filled 2020-05-04: qty 5

## 2020-05-04 MED ORDER — NITROGLYCERIN 0.4 MG SL SUBL
SUBLINGUAL_TABLET | SUBLINGUAL | Status: AC
  Filled 2020-05-04: qty 2

## 2020-05-04 MED ORDER — METOPROLOL TARTRATE 5 MG/5ML IV SOLN
5.0000 mg | INTRAVENOUS | Status: DC | PRN
  Administered 2020-05-04: 5 mg via INTRAVENOUS

## 2020-05-04 NOTE — Research (Signed)
Subject Name: [name]  Subject met inclusion and exclusion criteria.  The informed consent form, study requirements and expectations were reviewed with the subject and questions and concerns were addressed prior to the signing of the consent form.  The subject verbalized understanding of the trial requirements.  The subject agreed to participate in the IDENTIFY trial and signed the informed consent at 0706 on 05/04/20  The informed consent was obtained prior to performance of any protocol-specific procedures for the subject.  A copy of the signed informed consent was given to the subject and a copy was placed in the subject's medical record.   [name]

## 2020-05-05 DIAGNOSIS — E039 Hypothyroidism, unspecified: Secondary | ICD-10-CM | POA: Diagnosis not present

## 2020-05-05 DIAGNOSIS — E049 Nontoxic goiter, unspecified: Secondary | ICD-10-CM | POA: Diagnosis not present

## 2020-05-06 ENCOUNTER — Telehealth: Payer: Self-pay

## 2020-05-06 LAB — SPECIMEN STATUS REPORT

## 2020-05-06 LAB — VITAMIN B12: Vitamin B-12: 701 pg/mL (ref 232–1245)

## 2020-05-06 NOTE — Telephone Encounter (Signed)
-----   Message from Berniece Salines, DO sent at 05/06/2020  9:27 AM EST ----- Please have the patient come in next week to discuss her CT scan results.

## 2020-05-06 NOTE — Telephone Encounter (Signed)
Spoke with patient regarding results and recommendation.  Patient verbalizes understanding and is agreeable to plan of care. Advised patient to call back with any issues or concerns.  

## 2020-05-11 ENCOUNTER — Telehealth (INDEPENDENT_AMBULATORY_CARE_PROVIDER_SITE_OTHER): Payer: 59 | Admitting: Family Medicine

## 2020-05-11 ENCOUNTER — Other Ambulatory Visit: Payer: Self-pay

## 2020-05-11 VITALS — BP 143/96 | HR 90 | Temp 95.3°F | Wt 251.0 lb

## 2020-05-11 DIAGNOSIS — F4323 Adjustment disorder with mixed anxiety and depressed mood: Secondary | ICD-10-CM | POA: Diagnosis not present

## 2020-05-11 DIAGNOSIS — E1169 Type 2 diabetes mellitus with other specified complication: Secondary | ICD-10-CM | POA: Diagnosis not present

## 2020-05-11 DIAGNOSIS — E782 Mixed hyperlipidemia: Secondary | ICD-10-CM | POA: Diagnosis not present

## 2020-05-11 DIAGNOSIS — E669 Obesity, unspecified: Secondary | ICD-10-CM

## 2020-05-11 DIAGNOSIS — I10 Essential (primary) hypertension: Secondary | ICD-10-CM

## 2020-05-11 DIAGNOSIS — E559 Vitamin D deficiency, unspecified: Secondary | ICD-10-CM

## 2020-05-11 DIAGNOSIS — F32A Depression, unspecified: Secondary | ICD-10-CM | POA: Insufficient documentation

## 2020-05-12 ENCOUNTER — Other Ambulatory Visit (HOSPITAL_COMMUNITY): Payer: Self-pay | Admitting: "Endocrinology

## 2020-05-12 DIAGNOSIS — E042 Nontoxic multinodular goiter: Secondary | ICD-10-CM | POA: Diagnosis not present

## 2020-05-12 DIAGNOSIS — E039 Hypothyroidism, unspecified: Secondary | ICD-10-CM | POA: Diagnosis not present

## 2020-05-12 NOTE — Assessment & Plan Note (Signed)
Monitor and report any concerns, no changes to meds. Encouraged heart healthy diet such as the DASH diet and exercise as tolerated.  ?

## 2020-05-12 NOTE — Assessment & Plan Note (Signed)
The stress of working long shifts during th pandemic has made her feel more overwhelmed. She was previously referred to psychiatry but had trouble getting an appt and gave up. She is referred and current meds will be continued for now.

## 2020-05-12 NOTE — Assessment & Plan Note (Signed)
She is working with surgery in preparation for a gastric bypass surgery. They have requested a Vitamin D check. Ordered today

## 2020-05-12 NOTE — Assessment & Plan Note (Signed)
hgba1c acceptable, minimize simple carbs. Increase exercise as tolerated. Continue current meds 

## 2020-05-12 NOTE — Assessment & Plan Note (Signed)
Encouraged heart healthy diet, increase exercise, avoid trans fats, consider a krill oil cap daily 

## 2020-05-12 NOTE — Assessment & Plan Note (Signed)
Supplement and monitor 

## 2020-05-12 NOTE — Progress Notes (Addendum)
Virtual Visit via Video Note  I connected with [name] on 05/11/20 at  2:40 PM EST by a video enabled telemedicine application and verified that I am speaking with the correct person using two identifiers.  Location: Patient: home, patient and provider in the visit Provider: office   I discussed the limitations of evaluation and management by telemedicine and the availability of in person appointments. The patient expressed understanding and agreed to proceed. S Richarson, CMA was able to get the patient set up on a video visit    Subjective:    Patient ID: [name], female    DOB: Oct 05, 1960, 60 y.o.   MRN: [id number]  Chief Complaint  Patient presents with  . 3 month follow up  . vitamin d level check  . Labs Only    HPI Patient is in today for follow-up on chronic medical concerns.  She is following closely with general surgery and in the process of getting set up to have a gastric bypass.  She needs a vitamin D check for that work-up.  She has been following with their counselor and over the last week has had a hard time with long shifts at work and is feeling more fragile and anxious than she has in a while.  They tried to set her up with [facility] for psychiatric care but they are not receiving new patients.  She is ready to try different psychiatry group.  We recently had switched her to [facility] which had stabilized her for a time but now she feels more anxious, jittery and is struggling with anhedonia and fatigue.  No suicidal ideation is acknowledged. No recent febrile illness or hospitalizations. Denies CP/palp/SOB/HA/congestion/fevers/GI or GU c/o. Taking meds as prescribed  Past Medical History:  Diagnosis Date  . Acute bronchitis 05/25/2016  . Anxiety   . Chronic pain syndrome 01/06/2013  . Chronic rhinosinusitis   . Colon polyps   . Cough 01/06/2013  . Depression   . Diabetes ([place]) 01/06/2013  . Diabetes mellitus type 2 in obese [facility]) 01/06/2013   Sees Dr  Calvert Cantor for eye exam Does not see podiatry, foot exam today unremarkable except for thick cracking skin on heals   . Dysuria 05/25/2016  . Edema 01/06/2013  . Epistaxis 05/25/2016  . Fibroid, uterine   . GERD (gastroesophageal reflux disease)   . Glaucoma 12-13  . Hoarseness   . Hoarseness of voice 05/11/2013  . Hyperglycemia 04/13/2013  . Hyperlipemia, mixed 06/06/2007   Qualifier: Diagnosis of  By: Wynona Luna She feels Lipitor caused increased low back pain and weakness    . Hyperlipidemia   . Hypertension   . Hypokalemia 02/05/2013  . IBS (irritable bowel syndrome) 02/13/2011  . Low back pain 03/03/2013  . Muscle cramp 05/22/2014  . Obesity   . Obesity, unspecified 05/11/2013  . OSA (obstructive sleep apnea)   . Pain in joint, shoulder region 06/27/2015  . Perimenopause 10/05/2012  . Raynaud disease 06/16/2013  . Thyroid disease    hypothyroidism  . Vocal fold nodules     Past Surgical History:  Procedure Laterality Date  . ABDOMINAL HYSTERECTOMY    . BREAST EXCISIONAL BIOPSY Right    at age 9  . CHOLECYSTECTOMY    . CYSTECTOMY    . DILATION AND CURETTAGE OF UTERUS     x2  . I & D EXTREMITY Left 04/11/2018   Procedure: DEBIDMENT DISTAL INTERPHALANGEAL LEFT MIDDLE;  Surgeon: Daryll Brod, MD;  Location: [facility];  Service: Orthopedics;  Laterality: Left;  Marland Kitchen MASS EXCISION Left 04/11/2018   Procedure: EXCISION MASS;  Surgeon: Daryll Brod, MD;  Location: [facility];  Service: Orthopedics;  Laterality: Left;  . OOPHORECTOMY    . POLYPECTOMY    . ROTATOR CUFF REPAIR      Family History  Problem Relation Age of Onset  . Alcohol abuse Father   . Cancer Father        renal and colon  . Hyperlipidemia Father   . Hypertension Father   . Stroke Father   . Heart disease Father   . Cancer Paternal Grandfather        colon  . Diabetes Other   . High blood pressure Mother   . High Cholesterol Mother   . Kidney disease Mother   .  Depression Mother   . Anxiety disorder Mother   . Obesity Mother   . Breast cancer Other 45  . Diabetes Sister   . Diabetes Brother     Social History   Socioeconomic History  . Marital status: Significant Other    Spouse name: Not on file  . Number of children: 0  . Years of education: Not on file  . Highest education level: Not on file  Occupational History  . 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 Smoking status: Former Smoker    Packs/day: 0.75    Years: 42.00    Pack years: 31.50    Types: Cigarettes    Quit date: 04/24/2005    Years since quitting: 15.0  . Smokeless tobacco: Never Used  . Tobacco comment: smoked since age 60  Vaping Use  . Vaping Use: Never used  Substance and Sexual Activity  . Alcohol use: No  . Drug use: Never  . Sexual activity: Yes    Partners: Male  Other Topics Concern  . Not on file  Social History Narrative   Endo-- Dr Dwyane Dee   ENT--Dr shoemaker   GI--Dr [place]   Pulm--Dr Clance   Rheum--Dr Charlestine Night         Social Determinants of Health   Financial Resource Strain: Not on file  Food Insecurity: Not on file  Transportation Needs: Not on file  Physical Activity: Not on file  Stress: Not on file  Social Connections: Not on file  Intimate Partner Violence: Not on file    Outpatient Medications Prior to Visit  Medication Sig Dispense Refill  . acetaminophen (TYLENOL) 325 MG tablet Take 650 mg by mouth every 6 (six) hours as needed.    Marland Kitchen albuterol (VENTOLIN HFA) 108 (90 Base) MCG/ACT inhaler Inhale 2 puffs into the lungs every 4 (four) hours as needed for wheezing or shortness of breath. 18 g 5  . ARMOUR THYROID 90 MG tablet Take 90 mg by mouth daily.    Marland Kitchen aspirin EC 81 MG tablet Take 1 tablet (81 mg total) by mouth daily.    . Azelaic Acid 15 % cream SMARTSIG:Sparingly Topical Twice Daily    . Cod Liver Oil 1000 MG CAPS Take 1 capsule by mouth daily.    . diphenhydrAMINE (BENADRYL) 25 MG tablet Take 25 mg by mouth  every 6 (six) hours as needed.    . ezetimibe (ZETIA) 10 MG tablet Take 1 tablet (10 mg total) by mouth daily. 90 tablet 3  . FREESTYLE LITE test strip USE TO CHECK BLOOD SUGAR EVERY DAY AND AS NEEDED 100 strip 5  . furosemide (LASIX)  20 MG tablet TAKE 1 TABLET (20 MG TOTAL) BY MOUTH DAILY AS NEEDED. 90 tablet 1  . gabapentin (NEURONTIN) 100 MG capsule Take 2 capsules (200 mg total) by mouth 2 (two) times daily. 120 capsule 1  . gabapentin (NEURONTIN) 300 MG capsule Take 1-2 capsules (300-600 mg total) by mouth at bedtime. 180 capsule 1  . hyoscyamine (LEVSIN SL) 0.125 MG SL tablet Place 1 tablet (0.125 mg total) under the tongue every 4 (four) hours as needed. 40 tablet 1  . ipratropium (ATROVENT) 0.06 % nasal spray Place 2 sprays into the nose 3 (three) times daily as needed.    . loratadine (CLARITIN) 10 MG tablet Take 10 mg by mouth daily.    . metFORMIN (GLUCOPHAGE) 1000 MG tablet Take 0.5 tablets (500 mg total) by mouth daily with breakfast. 45 tablet 0  . metoprolol succinate (TOPROL-XL) 100 MG 24 hr tablet Take 1 tablet (100 mg total) by mouth daily. Take with or immediately following a meal. (Patient taking differently: Take 100 mg by mouth daily. Take 50mg  in morning and 100mg  at bedtime) 90 tablet 3  . Milnacipran (SAVELLA) 50 MG TABS tablet Take 1 tablet (50 mg total) by mouth 2 (two) times daily. 60 tablet 1  . modafinil (PROVIGIL) 200 MG tablet Take 2 tablets (400 mg total) by mouth daily. 90 tablet 1  . Prenatal Vit-Fe Fumarate-FA (M-VIT PO) Take by mouth.    . spironolactone (ALDACTONE) 50 MG tablet Take 1 tablet (50 mg total) by mouth 3 (three) times daily. 270 tablet 3  . TRULANCE 3 MG TABS Take 1 tablet by mouth daily.    . Vitamin D, Ergocalciferol, (DRISDOL) 1.25 MG (50000 UNIT) CAPS capsule Take 1 capsule (50,000 Units total) by mouth every 7 (seven) days. 4 capsule 4  . dexlansoprazole (DEXILANT) 60 MG capsule 30 mg.    . diphenhydrAMINE (SOMINEX) 25 MG tablet Take by mouth.      No facility-administered medications prior to visit.    Allergies  Allergen Reactions  . Crestor [Rosuvastatin] Other (See Comments)    Myalgias and rising CPK  . Losartan Cough  . Wellbutrin [Bupropion] Other (See Comments)    Dizziness and mental status changes  . Atorvastatin     myalgias  . Codeine   . Amoxicillin Rash  . Aspirin Nausea Only and Other (See Comments)    GI upset REACTION: Upset stomach  . Erythromycin Rash and Other (See Comments)  . Penicillins Rash    Review of Systems  Constitutional: Positive for malaise/fatigue. Negative for fever.  HENT: Negative for congestion.   Eyes: Negative for blurred vision.  Respiratory: Negative for shortness of breath.   Cardiovascular: Negative for chest pain, palpitations and leg swelling.  Gastrointestinal: Negative for abdominal pain, blood in stool and nausea.  Genitourinary: Negative for dysuria and frequency.  Musculoskeletal: Negative for falls.  Skin: Negative for rash.  Neurological: Negative for dizziness, loss of consciousness and headaches.  Endo/Heme/Allergies: Negative for environmental allergies.  Psychiatric/Behavioral: Positive for depression. The patient is nervous/anxious.        Objective:    Physical Exam Constitutional:      Appearance: Normal appearance. She is obese. She is not ill-appearing.  HENT:     Head: Normocephalic and atraumatic.     Right Ear: External ear normal.     Left Ear: External ear normal.     Nose: Nose normal.  Eyes:     General:        Right  eye: No discharge.        Left eye: No discharge.  Pulmonary:     Effort: Pulmonary effort is normal.  Neurological:     Mental Status: She is alert and oriented to person, place, and time.  Psychiatric:        Behavior: Behavior normal.     BP (!) 143/96 (Patient Position: Standing)   Pulse 90   Temp (!) 95.3 F (35.2 C) (Temporal)   Wt 251 lb (113.9 kg)   BMI 42.42 kg/m  Wt Readings from Last 3 Encounters:   05/11/20 251 lb (113.9 kg)  04/21/20 250 lb 14.4 oz (113.8 kg)  04/13/20 253 lb (114.8 kg)    Diabetic Foot Exam - Simple   No data filed    Lab Results  Component Value Date   WBC 4.6 02/13/2020   HGB 14.1 02/13/2020   HCT 43.4 02/13/2020   PLT 343 02/13/2020   GLUCOSE 97 04/27/2020   CHOL 214 (H) 02/13/2020   TRIG 182 (H) 02/13/2020   HDL 44 (L) 02/13/2020   LDLDIRECT 159.0 11/11/2019   LDLCALC 138 (H) 02/13/2020   ALT 21 02/13/2020   AST 19 02/13/2020   NA 139 04/27/2020   K 4.3 04/27/2020   CL 101 04/27/2020   CREATININE 0.92 04/27/2020   BUN 11 04/27/2020   CO2 23 04/27/2020   TSH 1.02 02/13/2020   HGBA1C 6.4 (H) 02/13/2020   MICROALBUR <0.7 08/19/2019    Lab Results  Component Value Date   TSH 1.02 02/13/2020   Lab Results  Component Value Date   WBC 4.6 02/13/2020   HGB 14.1 02/13/2020   HCT 43.4 02/13/2020   MCV 93.1 02/13/2020   PLT 343 02/13/2020   Lab Results  Component Value Date   NA 139 04/27/2020   K 4.3 04/27/2020   CO2 23 04/27/2020   GLUCOSE 97 04/27/2020   BUN 11 04/27/2020   CREATININE 0.92 04/27/2020   BILITOT 0.2 02/13/2020   ALKPHOS 60 11/11/2019   AST 19 02/13/2020   ALT 21 02/13/2020   PROT 6.7 02/13/2020   ALBUMIN 4.5 11/11/2019   CALCIUM 9.8 04/27/2020   ANIONGAP 10 04/04/2018   GFR 76.57 11/11/2019   Lab Results  Component Value Date   CHOL 214 (H) 02/13/2020   Lab Results  Component Value Date   HDL 44 (L) 02/13/2020   Lab Results  Component Value Date   LDLCALC 138 (H) 02/13/2020   Lab Results  Component Value Date   TRIG 182 (H) 02/13/2020   Lab Results  Component Value Date   CHOLHDL 4.9 02/13/2020   Lab Results  Component Value Date   HGBA1C 6.4 (H) 02/13/2020       Assessment & Plan:   Problem List Items Addressed This Visit    Hyperlipemia, mixed    Encouraged heart healthy diet, increase exercise, avoid trans fats, consider a krill oil cap daily      Benign essential hypertension     Monitor and report any concerns, no changes to meds. Encouraged heart healthy diet such as the DASH diet and exercise as tolerated.       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 She was previously referred to psychiatry but had trouble getting an appt and gave up. She is referred and current meds will be continued for now.  Relevant Orders   Ambulatory referral to Psychiatry   Vitamin D deficiency - Primary    Supplement and monitor      Relevant Orders   VITAMIN D 25 Hydroxy (Vit-D Deficiency, Fractures)   Diabetes mellitus type 2 in obese (HCC)    hgba1c acceptable, minimize simple carbs. Increase exercise as tolerated. Continue current meds      Morbid obesity ([place])    She is working with surgery in preparation for a gastric bypass surgery. They have requested a Vitamin D check. Ordered today         I am having [name] maintain her Cod Liver Oil, loratadine, acetaminophen, diphenhydrAMINE, hyoscyamine, aspirin EC, metFORMIN, FREESTYLE LITE, Prenatal Vit-Fe Fumarate-FA (M-VIT PO), Vitamin D (Ergocalciferol), spironolactone, metoprolol succinate, albuterol, gabapentin, ipratropium, modafinil, gabapentin, furosemide, ezetimibe, Savella, Azelaic Acid, Trulance, and Armour Thyroid.  No orders of the defined types were placed in this encounter.   I discussed the assessment and treatment plan with the patient. The patient was provided an opportunity to ask questions and all were answered. The patient agreed with the plan and demonstrated an understanding of the instructions.   The patient was advised to call back or seek an in-person evaluation if the symptoms worsen or if the condition fails to improve as anticipated.  I provided 25 minutes of non-face-to-face time during this encounter.   Penni Homans, MD

## 2020-05-13 ENCOUNTER — Ambulatory Visit: Payer: 59 | Admitting: Cardiology

## 2020-05-13 ENCOUNTER — Other Ambulatory Visit (INDEPENDENT_AMBULATORY_CARE_PROVIDER_SITE_OTHER): Payer: 59

## 2020-05-13 ENCOUNTER — Encounter: Payer: Self-pay | Admitting: Cardiology

## 2020-05-13 ENCOUNTER — Other Ambulatory Visit: Payer: Self-pay

## 2020-05-13 ENCOUNTER — Other Ambulatory Visit: Payer: Self-pay | Admitting: Cardiology

## 2020-05-13 VITALS — BP 112/60 | HR 74 | Ht 64.0 in | Wt 253.1 lb

## 2020-05-13 DIAGNOSIS — I2510 Atherosclerotic heart disease of native coronary artery without angina pectoris: Secondary | ICD-10-CM | POA: Diagnosis not present

## 2020-05-13 DIAGNOSIS — R7303 Prediabetes: Secondary | ICD-10-CM

## 2020-05-13 DIAGNOSIS — E559 Vitamin D deficiency, unspecified: Secondary | ICD-10-CM

## 2020-05-13 DIAGNOSIS — E782 Mixed hyperlipidemia: Secondary | ICD-10-CM

## 2020-05-13 DIAGNOSIS — Z01810 Encounter for preprocedural cardiovascular examination: Secondary | ICD-10-CM | POA: Diagnosis not present

## 2020-05-13 LAB — VITAMIN D 25 HYDROXY (VIT D DEFICIENCY, FRACTURES): VITD: 35.03 ng/mL (ref 30.00–100.00)

## 2020-05-13 MED ORDER — RANOLAZINE ER 500 MG PO TB12: 500.0000 mg | ORAL_TABLET | Freq: Two times a day (BID) | ORAL | 3 refills | Status: DC

## 2020-05-13 MED FILL — RANOLAZINE ER 500 MG TABLET: 500 | 30 days supply | Qty: 60 | Fill #0

## 2020-05-13 NOTE — Patient Instructions (Signed)
Medication Instructions:  Your physician has recommended you make the following change in your medication:   START: Ranexa 500 mg twice daily   *If you need a refill on your cardiac medications before your next appointment, please call your pharmacy*   Lab Work: Your physician recommends that you return for lab work fasting: lipids, LPA  If you have labs (blood work) drawn today and your tests are completely normal, you will receive your results only by: Marland Kitchen MyChart Message (if you have MyChart) OR . A paper copy in the mail If you have any lab test that is abnormal or we need to change your treatment, we will call you to review the results.   Testing/Procedures: None   Follow-Up: At [facility], you and your health needs are our priority.  As part of our continuing mission to provide you with exceptional heart care, we have created designated Provider Care Teams.  These Care Teams include your primary Cardiologist (physician) and Advanced Practice Providers (APPs -  Physician Assistants and Nurse Practitioners) who all work together to provide you with the care you need, when you need it.  We recommend signing up for the patient portal called "MyChart".  Sign up information is provided on this After Visit Summary.  MyChart is used to connect with patients for Virtual Visits (Telemedicine).  Patients are able to view lab/test results, encounter notes, upcoming appointments, etc.  Non-urgent messages can be sent to your provider as well.   To learn more about what you can do with MyChart, go to [email or web address].    Your next appointment:   3 month(s)  The format for your next appointment:   In Person  Provider:   Berniece Salines, DO   Other Instructions  Ranolazine tablets, extended release What is this medicine? RANOLAZINE (ra NOE la zeen) is a heart medicine. It is used to treat chronic chest pain (angina). This medicine must be taken regularly. It will not relieve an  acute episode of chest pain. This medicine may be used for other purposes; ask your health care provider or pharmacist if you have questions. COMMON BRAND NAME(S): Ranexa What should I tell my health care provider before I take this medicine? They need to know if you have any of these conditions:  heart disease  irregular heartbeat  kidney disease  liver disease  low levels of potassium or magnesium in the blood  an unusual or allergic reaction to ranolazine, other medicines, foods, dyes, or preservatives  pregnant or trying to get pregnant  breast-feeding How should I use this medicine? Take this medicine by mouth with a glass of water. Follow the directions on the prescription label. Do not cut, crush, or chew this medicine. Take with or without food. Do not take this medication with grapefruit juice. Take your doses at regular intervals. Do not take your medicine more often then directed. Talk to your pediatrician regarding the use of this medicine in children. Special care may be needed. Overdosage: If you think you have taken too much of this medicine contact a poison control center or emergency room at once. NOTE: This medicine is only for you. Do not share this medicine with others. What if I miss a dose? If you miss a dose, take it as soon as you can. If it is almost time for your next dose, take only that dose. Do not take double or extra doses. What may interact with this medicine? Do not take this medicine with any of  the following medications:  antivirals for HIV or AIDS  cerivastatin  certain antibiotics like chloramphenicol, clarithromycin, dalfopristin; quinupristin, isoniazid, rifabutin, rifampin, rifapentine  certain medicines used for cancer like imatinib, nilotinib  certain medicines for fungal infections like fluconazole, itraconazole, ketoconazole, posaconazole, voriconazole  certain medicines for irregular heart beat like dronedarone  certain medicines  for seizures like carbamazepine, fosphenytoin, oxcarbazepine, phenobarbital, phenytoin  cisapride  conivaptan  cyclosporine  grapefruit or grapefruit juice  lumacaftor; ivacaftor  nefazodone  pimozide  quinacrine  St John's wort  thioridazine This medicine may also interact with the following medications:  alfuzosin  certain medicines for depression, anxiety, or psychotic disturbances like bupropion, citalopram, fluoxetine, fluphenazine, paroxetine, perphenazine, risperidone, sertraline, trifluoperazine  certain medicines for cholesterol like atorvastatin, lovastatin, simvastatin  certain medicines for stomach problems like octreotide, palonosetron, prochlorperazine  eplerenone  ergot alkaloids like dihydroergotamine, ergonovine, ergotamine, methylergonovine  metformin  nicardipine  other medicines that prolong the QT interval (cause an abnormal heart rhythm) like dofetilide, ziprasidone  sirolimus  tacrolimus This list may not describe all possible interactions. Give your health care provider a list of all the medicines, herbs, non-prescription drugs, or dietary supplements you use. Also tell them if you smoke, drink alcohol, or use illegal drugs. Some items may interact with your medicine. What should I watch for while using this medicine? Visit your doctor for regular check ups. Tell your doctor or healthcare professional if your symptoms do not start to get better or if they get worse. This medicine will not relieve an acute attack of angina or chest pain. This medicine can change your heart rhythm. Your health care provider may check your heart rhythm by ordering an electrocardiogram (ECG) while you are taking this medicine. You may get drowsy or dizzy. Do not drive, use machinery, or do anything that needs mental alertness until you know how this medicine affects you. Do not stand or sit up quickly, especially if you are an older patient. This reduces the risk of  dizzy or fainting spells. Alcohol may interfere with the effect of this medicine. Avoid alcoholic drinks. If you are scheduled for any medical or dental procedure, tell your healthcare provider that you are taking this medicine. This medicine can interact with other medicines used during surgery. What side effects may I notice from receiving this medicine? Side effects that you should report to your doctor or health care professional as soon as possible:  allergic reactions like skin rash, itching or hives, swelling of the face, lips, or tongue  breathing problems  changes in vision  fast, irregular or pounding heartbeat  feeling faint or lightheaded, falls  low or high blood pressure  numbness or tingling feelings  ringing in the ears  tremor or shakiness  slow heartbeat (fewer than 50 beats per minute)  swelling of the legs or feet Side effects that usually do not require medical attention (report to your doctor or health care professional if they continue or are bothersome):  constipation  drowsy  dry mouth  headache  nausea or vomiting  stomach upset This list may not describe all possible side effects. Call your doctor for medical advice about side effects. You may report side effects to FDA at 1-800-FDA-1088. Where should I keep my medicine? Keep out of the reach of children. Store at room temperature between 15 and 30 degrees C (59 and 86 degrees F). Throw away any unused medicine after the expiration date. NOTE: This sheet is a summary. It may not cover all  possible information. If you have questions about this medicine, talk to your doctor, pharmacist, or health care provider.  2021 [employer] Standard (2018-04-02 09:18:49)

## 2020-05-13 NOTE — Progress Notes (Signed)
Cardiology Office Note:    Date:  05/13/2020   ID:  [name], DOB 1960-09-27, MRN [id number]  PCP:  Mosie Lukes, MD  Cardiologist:  Berniece Salines, DO  Electrophysiologist:  None   Referring MD: Mosie Lukes, MD   " I am doing better"  History of Present Illness:    [name] is a 60 y.o. female with a hx of hypertension, prediabetes, hyperlipidemia, mild coronary artery disease recently diagnosed on coronary CTA is here today for follow-up visit. Since I last saw the patient she tells me that she has been for the most part been doing well.  But she still does have some intermittent chest pain which is bothersome for her.  She notes that she is able to do her daily activities but describes the pain as a dull sensation in the mid chest which only comes on with exertion.  She also tells me that she was not able to tolerate Zetia therefore she stopped this medication sometimes back.  She is taking aspirin.  She denies any shortness of breath and notes that she is looking forward to her upcoming bariatric surgery.  She tells me her anxiety has been through the roof recently giving all the COVID 19 infections.  She is waiting for evaluation with behavioral health team.  Past Medical History:  Diagnosis Date  . Acute bronchitis 05/25/2016  . Anxiety   . Chronic pain syndrome 01/06/2013  . Chronic rhinosinusitis   . Colon polyps   . Cough 01/06/2013  . Depression   . Diabetes ([place]) 01/06/2013  . Diabetes mellitus type 2 in obese [facility]) 01/06/2013   Sees Dr Calvert Cantor for eye exam Does not see podiatry, foot exam today unremarkable except for thick cracking skin on heals   . Dysuria 05/25/2016  . Edema 01/06/2013  . Epistaxis 05/25/2016  . Fibroid, uterine   . GERD (gastroesophageal reflux disease)   . Glaucoma 12-13  . Hoarseness   . Hoarseness of voice 05/11/2013  . Hyperglycemia 04/13/2013  . Hyperlipemia, mixed 06/06/2007   Qualifier: Diagnosis of  By: Wynona Luna  She feels Lipitor caused increased low back pain and weakness    . Hyperlipidemia   . Hypertension   . Hypokalemia 02/05/2013  . IBS (irritable bowel syndrome) 02/13/2011  . Low back pain 03/03/2013  . Muscle cramp 05/22/2014  . Obesity   . Obesity, unspecified 05/11/2013  . OSA (obstructive sleep apnea)   . Pain in joint, shoulder region 06/27/2015  . Perimenopause 10/05/2012  . Raynaud disease 06/16/2013  . Thyroid disease    hypothyroidism  . Vocal fold nodules     Past Surgical History:  Procedure Laterality Date  . ABDOMINAL HYSTERECTOMY    . BREAST EXCISIONAL BIOPSY Right    at age 80  . CHOLECYSTECTOMY    . CYSTECTOMY    . DILATION AND CURETTAGE OF UTERUS     x2  . I & D EXTREMITY Left 04/11/2018   Procedure: DEBIDMENT DISTAL INTERPHALANGEAL LEFT MIDDLE;  Surgeon: Daryll Brod, MD;  Location: [place];  Service: Orthopedics;  Laterality: Left;  Marland Kitchen MASS EXCISION Left 04/11/2018   Procedure: EXCISION MASS;  Surgeon: Daryll Brod, MD;  Location: [place];  Service: Orthopedics;  Laterality: Left;  . OOPHORECTOMY    . POLYPECTOMY    . ROTATOR CUFF REPAIR      Current Medications: Current Meds  Medication Sig  . acetaminophen (TYLENOL) 325 MG tablet  Take 650 mg by mouth every 6 (six) hours as needed.  Marland Kitchen albuterol (VENTOLIN HFA) 108 (90 Base) MCG/ACT inhaler Inhale 2 puffs into the lungs every 4 (four) hours as needed for wheezing or shortness of breath.  Francia Greaves THYROID 90 MG tablet Take 90 mg by mouth daily.  Marland Kitchen aspirin EC 81 MG tablet Take 1 tablet (81 mg total) by mouth daily.  . Azelaic Acid 15 % cream SMARTSIG:Sparingly Topical Twice Daily  . Cod Liver Oil 1000 MG CAPS Take 1 capsule by mouth daily.  . diphenhydrAMINE (BENADRYL) 25 MG tablet Take 25 mg by mouth every 6 (six) hours as needed.  . ezetimibe (ZETIA) 10 MG tablet Take 1 tablet (10 mg total) by mouth daily.  Marland Kitchen FREESTYLE LITE test strip USE TO CHECK BLOOD SUGAR EVERY DAY AND AS  NEEDED  . furosemide (LASIX) 20 MG tablet TAKE 1 TABLET (20 MG TOTAL) BY MOUTH DAILY AS NEEDED.  Marland Kitchen gabapentin (NEURONTIN) 100 MG capsule Take 2 capsules (200 mg total) by mouth 2 (two) times daily.  Marland Kitchen gabapentin (NEURONTIN) 300 MG capsule Take 1-2 capsules (300-600 mg total) by mouth at bedtime.  . hyoscyamine (LEVSIN SL) 0.125 MG SL tablet Place 1 tablet (0.125 mg total) under the tongue every 4 (four) hours as needed.  Marland Kitchen ipratropium (ATROVENT) 0.06 % nasal spray Place 2 sprays into the nose 3 (three) times daily as needed.  . loratadine (CLARITIN) 10 MG tablet Take 10 mg by mouth daily.  . metFORMIN (GLUCOPHAGE) 1000 MG tablet Take 0.5 tablets (500 mg total) by mouth daily with breakfast.  . metoprolol succinate (TOPROL-XL) 100 MG 24 hr tablet Take 1 tablet (100 mg total) by mouth daily. Take with or immediately following a meal. (Patient taking differently: Take 100 mg by mouth daily. Take 50mg  in morning and 100mg  at bedtime)  . Milnacipran (SAVELLA) 50 MG TABS tablet Take 1 tablet (50 mg total) by mouth 2 (two) times daily.  . modafinil (PROVIGIL) 200 MG tablet Take 2 tablets (400 mg total) by mouth daily.  . Prenatal Vit-Fe Fumarate-FA (M-VIT PO) Take by mouth.  . ranolazine (RANEXA) 500 MG 12 hr tablet Take 1 tablet (500 mg total) by mouth 2 (two) times daily.  Marland Kitchen spironolactone (ALDACTONE) 50 MG tablet Take 1 tablet (50 mg total) by mouth 3 (three) times daily.  . TRULANCE 3 MG TABS Take 1 tablet by mouth daily.  . Vitamin D, Ergocalciferol, (DRISDOL) 1.25 MG (50000 UNIT) CAPS capsule Take 1 capsule (50,000 Units total) by mouth every 7 (seven) days.     Allergies:   Crestor [rosuvastatin], Losartan, Wellbutrin [bupropion], Atorvastatin, Codeine, Amoxicillin, Aspirin, Erythromycin, and Penicillins   Social History   Socioeconomic History  . Marital status: Significant Other    Spouse name: Not on file  . Number of children: 0  . Years of education: Not on file  . Highest education  level: Not on file  Occupational History  . 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 Smoking status: Former Smoker    Packs/day: 0.75    Years: 42.00    Pack years: 31.50    Types: Cigarettes    Quit date: 04/24/2005    Years since quitting: 15.0  . Smokeless tobacco: Never Used  . Tobacco comment: smoked since age 26  Vaping Use  . Vaping Use: Never used  Substance and Sexual Activity  . Alcohol use: No  . Drug use: Never  . Sexual activity: Yes  Partners: Male  Other Topics Concern  . Not on file  Social History Narrative   Endo-- Dr Dwyane Dee   ENT--Dr shoemaker   GI--Dr [place]   Pulm--Dr Clance   Rheum--Dr Charlestine Night         Social Determinants of Health   Financial Resource Strain: Not on file  Food Insecurity: Not on file  Transportation Needs: Not on file  Physical Activity: Not on file  Stress: Not on file  Social Connections: Not on file     Family History: The patient's family history includes Alcohol abuse in her father; Anxiety disorder in her mother; Breast cancer (age of onset: 51) in an other family member; Cancer in her father and paternal grandfather; Depression in her mother; Diabetes in her brother, sister, and another family member; Heart disease in her father; High Cholesterol in her mother; High blood pressure in her mother; Hyperlipidemia in her father; Hypertension in her father; Kidney disease in her mother; Obesity in her mother; Stroke in her father.  ROS:   Review of Systems  Constitution: Negative for decreased appetite, fever and weight gain.  HENT: Negative for congestion, ear discharge, hoarse voice and sore throat.   Eyes: Negative for discharge, redness, vision loss in right eye and visual halos.  Cardiovascular: Negative for chest pain, dyspnea on exertion, leg swelling, orthopnea and palpitations.  Respiratory: Negative for cough, hemoptysis, shortness of breath and snoring.   Endocrine: Negative for heat intolerance and  polyphagia.  Hematologic/Lymphatic: Negative for bleeding problem. Does not bruise/bleed easily.  Skin: Negative for flushing, nail changes, rash and suspicious lesions.  Musculoskeletal: Negative for arthritis, joint pain, muscle cramps, myalgias, neck pain and stiffness.  Gastrointestinal: Negative for abdominal pain, bowel incontinence, diarrhea and excessive appetite.  Genitourinary: Negative for decreased libido, genital sores and incomplete emptying.  Neurological: Negative for brief paralysis, focal weakness, headaches and loss of balance.  Psychiatric/Behavioral: Negative for altered mental status, depression and suicidal ideas.  Allergic/Immunologic: Negative for HIV exposure and persistent infections.    EKGs/Labs/Other Studies Reviewed:    The following studies were reviewed today:   EKG: None today  CCTA  IMPRESSION: 1. Coronary calcium score of 0. This was 1st percentile for age, sex, and race matched control.  2. Normal coronary origin.  Right dominance.  3. RCA is a large dominant artery that gives rise to PDA and PLA. There is a mild (25-49%) non-obstructive soft plaque in the proximal vessel, there is a mild (25-49%) non-obstructive soft plaque distal to a step artifact in the mid vessel.  4. CAD-RADS 2. Mild non-obstructive CAD (25-49%). Consider non-atherosclerotic causes of chest pain. Consider preventive therapy and risk factor modification.  5. Atypical pulmonary vein drainage (common left pulmonary vein) into the left atrium.  Recent Labs: 02/13/2020: ALT 21; Hemoglobin 14.1; Platelets 343; TSH 1.02 04/27/2020: BUN 11; Creatinine, Ser 0.92; Potassium 4.3; Sodium 139  Recent Lipid Panel    Component Value Date/Time   CHOL 214 (H) 02/13/2020 0857   CHOL 210 (H) 07/03/2017 1002   TRIG 182 (H) 02/13/2020 0857   HDL 44 (L) 02/13/2020 0857   HDL 47 07/03/2017 1002   CHOLHDL 4.9 02/13/2020 0857   VLDL 43.2 (H) 11/11/2019 1125   LDLCALC 138 (H)  02/13/2020 0857   LDLDIRECT 159.0 11/11/2019 1125    Physical Exam:    VS:  BP 112/60   Pulse 74   Ht 5\' 4"  (1.626 m)   Wt 253 lb 1.6 oz (114.8 kg)   BMI  43.44 kg/m     Wt Readings from Last 3 Encounters:  05/13/20 253 lb 1.6 oz (114.8 kg)  05/11/20 251 lb (113.9 kg)  04/21/20 250 lb 14.4 oz (113.8 kg)     GEN: Well nourished, well developed in no acute distress HEENT: Normal NECK: No JVD; No carotid bruits LYMPHATICS: No lymphadenopathy CARDIAC: S1S2 noted,RRR, no murmurs, rubs, gallops RESPIRATORY:  Clear to auscultation without rales, wheezing or rhonchi  ABDOMEN: Soft, non-tender, non-distended, +bowel sounds, no guarding. EXTREMITIES: No edema, No cyanosis, no clubbing MUSCULOSKELETAL:  No deformity  SKIN: Warm and dry NEUROLOGIC:  Alert and oriented x 3, non-focal PSYCHIATRIC:  Normal affect, good insight  ASSESSMENT:    1. Hyperlipemia, mixed   2. Mild CAD   3. Prediabetes   4. Preoperative cardiovascular examination   5. Morbid obesity ([place])    PLAN:      We talked about her new diagnosis of coronary artery disease.  Explained to the patient what this means and the implications.  Unfortunately she cannot tolerate statin medication and she does not intend to try any other kind.  We will place her on Zetia hoping to have this medication help her with her goal.  She really has declined trying Livalo.  She will be a great candidate for PCSK9 inhibitors she is going to see lipid clinic with an upcoming visit coming up soon.  We will repeat her lipid profile I also like to get a LP(a) for this patient as well.  In the meantime says she has had some intermittent chest pain I am going to give her Ranexa 500 mg twice a day and hopefully this will help given the fact that she does have coronary artery disease and if microvascular dysfunction is help causing any issues here.  She is pending bariatric surgery and she is looking forward to this.  The patient does not have  any unstable cardiac conditions.  Upon evaluation today, she can achieve 4 METs or greater without anginal symptoms.  According to [facility] and AHA guidelines, she requires no further cardiac workup prior to her noncardiac surgery and should be at acceptable risk.  Our service is available as necessary in the perioperative period.  Blood pressure is acceptable, continue with current antihypertensive regimen.    The patient is in agreement with the above plan. The patient left the office in stable condition.  The patient will follow up in 3 months or sooner if needed.   Medication Adjustments/Labs and Tests Ordered: Current medicines are reviewed at length with the patient today.  Concerns regarding medicines are outlined above.  Orders Placed This Encounter  Procedures  . Lipid panel  . Lipoprotein A (LPA)   Meds ordered this encounter  Medications  . ranolazine (RANEXA) 500 MG 12 hr tablet    Sig: Take 1 tablet (500 mg total) by mouth 2 (two) times daily.    Dispense:  60 tablet    Refill:  3    Patient Instructions  Medication Instructions:  Your physician has recommended you make the following change in your medication:   START: Ranexa 500 mg twice daily   *If you need a refill on your cardiac medications before your next appointment, please call your pharmacy*   Lab Work: Your physician recommends that you return for lab work fasting: lipids, LPA  If you have labs (blood work) drawn today and your tests are completely normal, you will receive your results only by: Marland Kitchen MyChart Message (if you have  MyChart) OR . A paper copy in the mail If you have any lab test that is abnormal or we need to change your treatment, we will call you to review the results.   Testing/Procedures: None   Follow-Up: At [facility], you and your health needs are our priority.  As part of our continuing mission to provide you with exceptional heart care, we have created designated Provider Care  Teams.  These Care Teams include your primary Cardiologist (physician) and Advanced Practice Providers (APPs -  Physician Assistants and Nurse Practitioners) who all work together to provide you with the care you need, when you need it.  We recommend signing up for the patient portal called "MyChart".  Sign up information is provided on this After Visit Summary.  MyChart is used to connect with patients for Virtual Visits (Telemedicine).  Patients are able to view lab/test results, encounter notes, upcoming appointments, etc.  Non-urgent messages can be sent to your provider as well.   To learn more about what you can do with MyChart, go to [email or web address].    Your next appointment:   3 month(s)  The format for your next appointment:   In Person  Provider:   Berniece Salines, DO   Other Instructions  Ranolazine tablets, extended release What is this medicine? RANOLAZINE (ra NOE la zeen) is a heart medicine. It is used to treat chronic chest pain (angina). This medicine must be taken regularly. It will not relieve an acute episode of chest pain. This medicine may be used for other purposes; ask your health care provider or pharmacist if you have questions. COMMON BRAND NAME(S): Ranexa What should I tell my health care provider before I take this medicine? They need to know if you have any of these conditions:  heart disease  irregular heartbeat  kidney disease  liver disease  low levels of potassium or magnesium in the blood  an unusual or allergic reaction to ranolazine, other medicines, foods, dyes, or preservatives  pregnant or trying to get pregnant  breast-feeding How should I use this medicine? Take this medicine by mouth with a glass of water. Follow the directions on the prescription label. Do not cut, crush, or chew this medicine. Take with or without food. Do not take this medication with grapefruit juice. Take your doses at regular intervals. Do not take your  medicine more often then directed. Talk to your pediatrician regarding the use of this medicine in children. Special care may be needed. Overdosage: If you think you have taken too much of this medicine contact a poison control center or emergency room at once. NOTE: This medicine is only for you. Do not share this medicine with others. What if I miss a dose? If you miss a dose, take it as soon as you can. If it is almost time for your next dose, take only that dose. Do not take double or extra doses. What may interact with this medicine? Do not take this medicine with any of the following medications:  antivirals for HIV or AIDS  cerivastatin  certain antibiotics like chloramphenicol, clarithromycin, dalfopristin; quinupristin, isoniazid, rifabutin, rifampin, rifapentine  certain medicines used for cancer like imatinib, nilotinib  certain medicines for fungal infections like fluconazole, itraconazole, ketoconazole, posaconazole, voriconazole  certain medicines for irregular heart beat like dronedarone  certain medicines for seizures like carbamazepine, fosphenytoin, oxcarbazepine, phenobarbital, phenytoin  cisapride  conivaptan  cyclosporine  grapefruit or grapefruit juice  lumacaftor; ivacaftor  nefazodone  pimozide  quinacrine  St John's wort  thioridazine This medicine may also interact with the following medications:  alfuzosin  certain medicines for depression, anxiety, or psychotic disturbances like bupropion, citalopram, fluoxetine, fluphenazine, paroxetine, perphenazine, risperidone, sertraline, trifluoperazine  certain medicines for cholesterol like atorvastatin, lovastatin, simvastatin  certain medicines for stomach problems like octreotide, palonosetron, prochlorperazine  eplerenone  ergot alkaloids like dihydroergotamine, ergonovine, ergotamine, methylergonovine  metformin  nicardipine  other medicines that prolong the QT interval (cause an  abnormal heart rhythm) like dofetilide, ziprasidone  sirolimus  tacrolimus This list may not describe all possible interactions. Give your health care provider a list of all the medicines, herbs, non-prescription drugs, or dietary supplements you use. Also tell them if you smoke, drink alcohol, or use illegal drugs. Some items may interact with your medicine. What should I watch for while using this medicine? Visit your doctor for regular check ups. Tell your doctor or healthcare professional if your symptoms do not start to get better or if they get worse. This medicine will not relieve an acute attack of angina or chest pain. This medicine can change your heart rhythm. Your health care provider may check your heart rhythm by ordering an electrocardiogram (ECG) while you are taking this medicine. You may get drowsy or dizzy. Do not drive, use machinery, or do anything that needs mental alertness until you know how this medicine affects you. Do not stand or sit up quickly, especially if you are an older patient. This reduces the risk of dizzy or fainting spells. Alcohol may interfere with the effect of this medicine. Avoid alcoholic drinks. If you are scheduled for any medical or dental procedure, tell your healthcare provider that you are taking this medicine. This medicine can interact with other medicines used during surgery. What side effects may I notice from receiving this medicine? Side effects that you should report to your doctor or health care professional as soon as possible:  allergic reactions like skin rash, itching or hives, swelling of the face, lips, or tongue  breathing problems  changes in vision  fast, irregular or pounding heartbeat  feeling faint or lightheaded, falls  low or high blood pressure  numbness or tingling feelings  ringing in the ears  tremor or shakiness  slow heartbeat (fewer than 50 beats per minute)  swelling of the legs or feet Side effects  that usually do not require medical attention (report to your doctor or health care professional if they continue or are bothersome):  constipation  drowsy  dry mouth  headache  nausea or vomiting  stomach upset This list may not describe all possible side effects. Call your doctor for medical advice about side effects. You may report side effects to FDA at 1-800-FDA-1088. Where should I keep my medicine? Keep out of the reach of children. Store at room temperature between 15 and 30 degrees C (59 and 86 degrees F). Throw away any unused medicine after the expiration date. NOTE: This sheet is a summary. It may not cover all possible information. If you have questions about this medicine, talk to your doctor, pharmacist, or health care provider.  2021 Elsevier/Gold Standard (2018-04-02 09:18:49)      Adopting a Healthy Lifestyle.  Know what a healthy weight is for you (roughly BMI <25) and aim to maintain this   Aim for 7+ servings of fruits and vegetables daily   65-80+ fluid ounces of water or unsweet tea for healthy kidneys   Limit to max 1 drink of alcohol per day;  avoid smoking/tobacco   Limit animal fats in diet for cholesterol and heart health - choose grass fed whenever available   Avoid highly processed foods, and foods high in saturated/trans fats   Aim for low stress - take time to unwind and care for your mental health   Aim for 150 min of moderate intensity exercise weekly for heart health, and weights twice weekly for bone health   Aim for 7-9 hours of sleep daily   When it comes to diets, agreement about the perfect plan isnt easy to find, even among the experts. Experts at the [employer] developed an idea known as the Healthy Eating Plate. Just imagine a plate divided into logical, healthy portions.   The emphasis is on diet quality:   Load up on vegetables and fruits - one-half of your plate: Aim for color and variety, and remember  that potatoes dont count.   Go for whole grains - one-quarter of your plate: Whole wheat, barley, wheat berries, quinoa, oats, brown rice, and foods made with them. If you want pasta, go with whole wheat pasta.   Protein power - one-quarter of your plate: Fish, chicken, beans, and nuts are all healthy, versatile protein sources. Limit red meat.   The diet, however, does go beyond the plate, offering a few other suggestions.   Use healthy plant oils, such as olive, canola, soy, corn, sunflower and peanut. Check the labels, and avoid partially hydrogenated oil, which have unhealthy trans fats.   If youre thirsty, drink water. Coffee and tea are good in moderation, but skip sugary drinks and limit milk and dairy products to one or two daily servings.   The type of carbohydrate in the diet is more important than the amount. Some sources of carbohydrates, such as vegetables, fruits, whole grains, and beans-are healthier than others.   Finally, stay active  Signed, Berniece Salines, DO  05/13/2020 12:20 PM    [facility]

## 2020-05-17 MED FILL — ARMOUR THYROID 90 MG TABLET: 90 | 90 days supply | Qty: 90 | Fill #0

## 2020-05-18 ENCOUNTER — Other Ambulatory Visit: Payer: Self-pay | Admitting: Family Medicine

## 2020-05-18 MED FILL — MODAFINIL 200 MG TABLET: 200 | 90 days supply | Qty: 180 | Fill #0

## 2020-05-18 MED FILL — FUROSEMIDE 20 MG TABS: 20 | 90 days supply | Qty: 90 | Fill #1

## 2020-05-18 NOTE — Telephone Encounter (Signed)
Rx faxed to [facility]

## 2020-05-18 NOTE — Telephone Encounter (Signed)
Provigil 200mg  refilled (forgot it was controlled)  Requesting: Provigil 200mg  Contract: None UDS: None Last Visit: 05/11/20  Next Visit: 07/13/20 Last Refill: 11/26/2019 #90 and 1RF  Please Advise

## 2020-05-19 ENCOUNTER — Other Ambulatory Visit: Payer: Self-pay

## 2020-05-19 ENCOUNTER — Encounter: Payer: Self-pay | Admitting: Acute Care

## 2020-05-19 ENCOUNTER — Ambulatory Visit: Payer: 59 | Admitting: Acute Care

## 2020-05-19 VITALS — BP 118/72 | HR 76 | Temp 97.1°F | Ht 64.5 in | Wt 259.4 lb

## 2020-05-19 DIAGNOSIS — G4733 Obstructive sleep apnea (adult) (pediatric): Secondary | ICD-10-CM | POA: Diagnosis not present

## 2020-05-19 DIAGNOSIS — Z9989 Dependence on other enabling machines and devices: Secondary | ICD-10-CM

## 2020-05-19 NOTE — Patient Instructions (Addendum)
It is good to see you today. Continue on CPAP at bedtime. You appear to be benefiting from the treatment  Goal is to wear for at least 6 hours each night for maximal clinical benefit. Continue to work on weight loss, as the link between excess weight  and sleep apnea is well established.   Remember to establish a good bedtime routine, and work on sleep hygiene.  Limit daytime naps , avoid stimulants such as caffeine and nicotine close to bedtime, exercise daily to promote sleep quality, avoid heavy , spicy, fried , or rich foods before bed. Ensure adequate exposure to natural light during the day,establish a relaxing bedtime routine with a pleasant sleep environment ( Bedroom between 60 and 67 degrees, turn off bright lights , TV or device screens screens , consider black out curtains or white noise machines) Do not drive if sleepy. Remember to clean mask, tubing, filter, and reservoir once weekly with soapy water.  Follow up with Judson Roch NP or Dr. Halford Chessman in 6 months or before as needed.   Good luck with surgery. Continue Provigil two 200 mg tablets as needed with work, and 200 mg daily when you are off.  Please have [facility] order your CPAP while in the hospital if it os ok to use post op.  Please contact office for sooner follow up if symptoms do not improve or worsen or seek emergency care

## 2020-05-19 NOTE — Progress Notes (Signed)
Reviewed and agree with assessment/plan.   Saamiya Jeppsen, MD [facility] 05/19/2020, 7:48 PM Pager:  [phone]  

## 2020-05-19 NOTE — Progress Notes (Addendum)
History of Present Illness [name] a 60 y.o.femaleformer smoker, Quit 2005 with obstructive sleep apnea and CPAP use.She takes Provigil 400 mg daily. She is followed by Dr. Halford Chessman.   05/19/2020 Follow up OSA/ CPAP Pt. Presents for annual check for her OSA and CPAP use. She states she  been doing well. She states she is using her Provigil 400 mg daily when she works. She has noted the increase from 300 to 400 mg over the last year has been very helpful.She is compliant with her CPAP 100% of the time. She has no issues with her Equipment. She has no equipment needs. She is having weight loss surgery through [facility] . She is waiting to see when the date will be for her surgery. She is excited that this may help her feel better. We did discuss she will need to have her CPAP machine to use post op if allowable for to use at that time per surgery. She does use her rescue inhaler prn dyspnea. She states she uses this approximately 10 times a month. She denies any fever, chest pain, orthopnea . She does need her Covid booster. Last vaccine was 05/26/2019, so she is eligible.   Test Results: 2008 HST was (+) with AHI of 12.   05/04/2020 CT Coronary Morph/ CTA Cor Coronary calcium score of 0. This was 1st percentile for age, sex, and race matched control.  Normal coronary origin.  Right dominance.  RCA is a large dominant artery that gives rise to PDA and PLA. There is a mild (25-49%) non-obstructive soft plaque in the proximal vessel, there is a mild (25-49%) non-obstructive soft plaque distal to a step artifact in the mid vessel.  CAD-RADS 2. Mild non-obstructive CAD (25-49%). Consider non-atherosclerotic causes of chest pain. Consider preventive therapy and risk factor modification.  Atypical pulmonary vein drainage (common left pulmonary vein) into the left atrium.  09/2019 LDCT Lung-RADS 2, benign appearance or behavior. Continue annual screening with low-dose chest CT without  contrast in 12 months. 2. Mild diffuse bronchial wall thickening with very mild centrilobular and paraseptal emphysema; imaging findings suggestive of underlying COPD. 3. Hepatic steatosis.  Follow up due 09/2020  Down Load 04/18/2021- 05/17/2020        CBC Latest Ref Rng & Units 02/13/2020 11/11/2019 07/29/2019  WBC 3.8 - 10.8 Thousand/uL 4.6 3.9(L) 4.1  Hemoglobin 11.7 - 15.5 g/dL 14.1 14.3 13.5  Hematocrit 35.0 - 45.0 % 43.4 42.3 40.0  Platelets 140 - 400 Thousand/uL 343 365.0 422.0(H)    BMP Latest Ref Rng & Units 04/27/2020 02/13/2020 11/11/2019  Glucose 65 - 99 mg/dL 97 104(H) 79  BUN 6 - 24 mg/dL 11 12 10   Creatinine 0.57 - 1.00 mg/dL 0.92 0.83 0.91  BUN/Creat Ratio 9 - 23 12 NOT APPLICABLE -  Sodium Q000111Q - 144 mmol/L 139 139 139  Potassium 3.5 - 5.2 mmol/L 4.3 4.5 4.5  Chloride 96 - 106 mmol/L 101 104 104  CO2 20 - 29 mmol/L 23 24 27   Calcium 8.7 - 10.2 mg/dL 9.8 10.0 10.3    BNP No results found for: BNP  ProBNP No results found for: PROBNP  PFT    Component Value Date/Time   FEV1PRE 2.24 02/25/2016 1100   FEV1POST 2.36 02/25/2016 1100   FVCPRE 2.63 02/25/2016 1100   FVCPOST 2.67 02/25/2016 1100   TLC 4.02 02/25/2016 1100   DLCOUNC 18.63 02/25/2016 1100   PREFEV1FVCRT 85 02/25/2016 1100   PSTFEV1FVCRT 88 02/25/2016 1100  CT CORONARY MORPH W/CTA COR W/SCORE W/CA W/CM &/OR WO/CM  Addendum Date: 05/04/2020   ADDENDUM REPORT: 05/04/2020 13:25 CLINICAL DATA:  60 Year-old African American Female EXAM: Cardiac/Coronary  CTA TECHNIQUE: The patient was scanned on a [employer]. FINDINGS: A 100 kV prospective scan was triggered in the descending thoracic aorta at 111 HU's. Axial non-contrast 3 mm slices were carried out through the heart. The data set was analyzed on a dedicated work station and scored using the [place]. Gantry rotation speed was 250 msecs and collimation was .6 mm. No beta blockade and 0.8 mg of sl NTG was given. The 3D data set was  reconstructed in 5% intervals of the 67-82 % of the R-R cycle. Diastolic phases were analyzed on a dedicated work station using MPR, MIP and VRT modes. The patient received 80 cc of contrast. Aorta:  Normal size.  No calcifications.  No dissection. Aortic Valve:  Tri-leaflet.  No calcifications. Coronary Arteries:  Normal coronary origin.  Right dominance. Coronary calcium score of 0. This was 1st percentile for age, sex, and race matched control. RCA is a large dominant artery that gives rise to PDA and PLA. There is a mild (25-49%) non-obstructive soft plaque in the proximal vessel, there is a mild (25-49%) non-obstructive soft plaque distal to a step artifact in the mid vessel. Left main is a large artery that gives rise to LAD and LCX arteries. LAD is a large vessel that gives rise to a moderate size D1 branch that bifurcates. There is no plaque. There is a distal step artifact. LCX is a non-dominant artery that gives rise to one moderate OM1 branch. There is no plaque. Other findings: Atypical pulmonary vein drainage (common left pulmonary vein) into the left atrium. Normal left atrial appendage without a thrombus. Normal size of the pulmonary artery. Extra-cardiac findings: See attached radiology report for non-cardiac structures. IMPRESSION: 1. Coronary calcium score of 0. This was 1st percentile for age, sex, and race matched control. 2. Normal coronary origin.  Right dominance. 3. RCA is a large dominant artery that gives rise to PDA and PLA. There is a mild (25-49%) non-obstructive soft plaque in the proximal vessel, there is a mild (25-49%) non-obstructive soft plaque distal to a step artifact in the mid vessel. 4. CAD-RADS 2. Mild non-obstructive CAD (25-49%). Consider non-atherosclerotic causes of chest pain. Consider preventive therapy and risk factor modification. 5. Atypical pulmonary vein drainage (common left pulmonary vein) into the left atrium. Rudean Haskell MD Electronically Signed   By:  Rudean Haskell MD   On: 05/04/2020 13:25   Result Date: 05/04/2020 EXAM: OVER-READ INTERPRETATION  CT CHEST The following report is an over-read performed by radiologist Dr. Vinnie Langton of [facility], [place] on 05/04/2020. This over-read does not include interpretation of cardiac or coronary anatomy or pathology. The coronary calcium score/coronary CTA interpretation by the cardiologist is attached. COMPARISON:  None. FINDINGS: Within the visualized portions of the thorax there are no suspicious appearing pulmonary nodules or masses, there is no acute consolidative airspace disease, no pleural effusions, no pneumothorax and no lymphadenopathy. Visualized portions of the upper abdomen demonstrates mild diffuse low attenuation throughout the visualized hepatic parenchyma, indicative of hepatic steatosis. There are no aggressive appearing lytic or blastic lesions noted in the visualized portions of the skeleton. IMPRESSION: 1. Hepatic steatosis. Electronically Signed: By: Vinnie Langton M.D. On: 05/04/2020 09:26     Past medical hx Past Medical History:  Diagnosis Date  . Acute bronchitis  05/25/2016  . Anxiety   . Chronic pain syndrome 01/06/2013  . Chronic rhinosinusitis   . Colon polyps   . Cough 01/06/2013  . Depression   . Diabetes ([place]) 01/06/2013  . Diabetes mellitus type 2 in obese [facility]) 01/06/2013   Sees Dr Calvert Cantor for eye exam Does not see podiatry, foot exam today unremarkable except for thick cracking skin on heals   . Dysuria 05/25/2016  . Edema 01/06/2013  . Epistaxis 05/25/2016  . Fibroid, uterine   . GERD (gastroesophageal reflux disease)   . Glaucoma 12-13  . Hoarseness   . Hoarseness of voice 05/11/2013  . Hyperglycemia 04/13/2013  . Hyperlipemia, mixed 06/06/2007   Qualifier: Diagnosis of  By: Wynona Luna She feels Lipitor caused increased low back pain and weakness    . Hyperlipidemia   . Hypertension   . Hypokalemia 02/05/2013  . IBS (irritable bowel  syndrome) 02/13/2011  . Low back pain 03/03/2013  . Muscle cramp 05/22/2014  . Obesity   . Obesity, unspecified 05/11/2013  . OSA (obstructive sleep apnea)   . Pain in joint, shoulder region 06/27/2015  . Perimenopause 10/05/2012  . Raynaud disease 06/16/2013  . Thyroid disease    hypothyroidism  . Vocal fold nodules      Social History   Tobacco Use  . Smoking status: Former Smoker    Packs/day: 0.75    Years: 42.00    Pack years: 31.50    Types: Cigarettes    Quit date: 04/24/2005    Years since quitting: 15.0  . Smokeless tobacco: Never Used  . Tobacco comment: smoked since age 89  Vaping Use  . Vaping Use: Never used  Substance Use Topics  . Alcohol use: No  . Drug use: Never    Ms.Nace reports that she quit smoking about 15 years ago. Her smoking use included cigarettes. She has a 31.50 pack-year smoking history. She has never used smokeless tobacco. She reports that she does not drink alcohol and does not use drugs.  Tobacco Cessation: Former smoker quit 2007 with a 31.5 pack year smoking history. Lung Cancer screening due 09/2020  Past surgical hx, Family hx, Social hx all reviewed.  Current Outpatient Medications on File Prior to Visit  Medication Sig  . acetaminophen (TYLENOL) 325 MG tablet Take 650 mg by mouth every 6 (six) hours as needed.  Marland Kitchen albuterol (VENTOLIN HFA) 108 (90 Base) MCG/ACT inhaler Inhale 2 puffs into the lungs every 4 (four) hours as needed for wheezing or shortness of breath.  Francia Greaves THYROID 90 MG tablet Take 90 mg by mouth daily.  Marland Kitchen aspirin EC 81 MG tablet Take 1 tablet (81 mg total) by mouth daily.  . Azelaic Acid 15 % cream SMARTSIG:Sparingly Topical Twice Daily  . Cod Liver Oil 1000 MG CAPS Take 1 capsule by mouth daily.  . diphenhydrAMINE (BENADRYL) 25 MG tablet Take 25 mg by mouth every 6 (six) hours as needed.  . ezetimibe (ZETIA) 10 MG tablet Take 1 tablet (10 mg total) by mouth daily.  Marland Kitchen FREESTYLE LITE test strip USE TO CHECK BLOOD  SUGAR EVERY DAY AND AS NEEDED  . furosemide (LASIX) 20 MG tablet TAKE 1 TABLET (20 MG TOTAL) BY MOUTH DAILY AS NEEDED.  Marland Kitchen gabapentin (NEURONTIN) 100 MG capsule Take 2 capsules (200 mg total) by mouth 2 (two) times daily.  Marland Kitchen gabapentin (NEURONTIN) 300 MG capsule Take 1-2 capsules (300-600 mg total) by mouth at bedtime.  . hyoscyamine (LEVSIN SL) 0.125  MG SL tablet Place 1 tablet (0.125 mg total) under the tongue every 4 (four) hours as needed.  Marland Kitchen ipratropium (ATROVENT) 0.06 % nasal spray Place 2 sprays into the nose 3 (three) times daily as needed.  . loratadine (CLARITIN) 10 MG tablet Take 10 mg by mouth daily.  . metFORMIN (GLUCOPHAGE) 1000 MG tablet Take 0.5 tablets (500 mg total) by mouth daily with breakfast.  . metoprolol succinate (TOPROL-XL) 100 MG 24 hr tablet Take 1 tablet (100 mg total) by mouth daily. Take with or immediately following a meal. (Patient taking differently: Take 100 mg by mouth daily. Take 50mg  in morning and 100mg  at bedtime)  . Milnacipran (SAVELLA) 50 MG TABS tablet Take 1 tablet (50 mg total) by mouth 2 (two) times daily.  . modafinil (PROVIGIL) 200 MG tablet Take 2 tablets (400 mg total) by mouth daily.  . Prenatal Vit-Fe Fumarate-FA (M-VIT PO) Take by mouth.  . ranolazine (RANEXA) 500 MG 12 hr tablet Take 1 tablet (500 mg total) by mouth 2 (two) times daily.  Marland Kitchen spironolactone (ALDACTONE) 50 MG tablet Take 1 tablet (50 mg total) by mouth 3 (three) times daily.  . TRULANCE 3 MG TABS Take 1 tablet by mouth daily.  . Vitamin D, Ergocalciferol, (DRISDOL) 1.25 MG (50000 UNIT) CAPS capsule Take 1 capsule (50,000 Units total) by mouth every 7 (seven) days.   No current facility-administered medications on file prior to visit.     Allergies  Allergen Reactions  . Crestor [Rosuvastatin] Other (See Comments)    Myalgias and rising CPK  . Losartan Cough  . Wellbutrin [Bupropion] Other (See Comments)    Dizziness and mental status changes  . Atorvastatin     myalgias   . Codeine   . Amoxicillin Rash  . Aspirin Nausea Only and Other (See Comments)    GI upset REACTION: Upset stomach  . Erythromycin Rash and Other (See Comments)  . Penicillins Rash    Review Of Systems:  Constitutional:   No  weight loss, night sweats,  Fevers, chills, fatigue, or  lassitude.  HEENT:   No headaches,  Difficulty swallowing,  Tooth/dental problems, or  Sore throat,                No sneezing, itching, ear ache, nasal congestion, post nasal drip,   CV:  No chest pain,  Orthopnea, PND, swelling in lower extremities, anasarca, dizziness, palpitations, syncope.   GI  No heartburn, indigestion, abdominal pain, nausea, vomiting, diarrhea, change in bowel habits, loss of appetite, bloody stools.   Resp: No shortness of breath with exertion or at rest.  No excess mucus, no productive cough,  No non-productive cough,  No coughing up of blood.  No change in color of mucus.  No wheezing.  No chest wall deformity  Skin: no rash or lesions.  GU: no dysuria, change in color of urine, no urgency or frequency.  No flank pain, no hematuria   MS:  No joint pain or swelling.  No decreased range of motion.  + back pain, Right arm pain  Psych:  No change in mood or affect. No depression or anxiety.  No memory loss.   Vital Signs BP 118/72 (BP Location: Left Arm, Cuff Size: Normal)   Pulse 76   Temp (!) 97.1 F (36.2 C) (Oral)   Ht 5' 4.5" (1.638 m)   Wt 259 lb 6.4 oz (117.7 kg)   SpO2 97%   BMI 43.84 kg/m    Physical Exam:  General- No distress,  A&Ox3, pleasant ENT: No sinus tenderness, TM clear, pale nasal mucosa, no oral exudate,no post nasal drip, no LAN Cardiac: S1, S2, regular rate and rhythm, no murmur Chest: No wheeze/ rales/ dullness; no accessory muscle use, no nasal flaring, no sternal retractions Abd.: Soft Non-tender, ND, BS +, Body mass index is 43.84 kg/m. Ext: No clubbing cyanosis, edema Neuro:  normal strength, MAE x 4, A&O x 3 Skin: No rashes, warm  and dry Psych: normal mood and behavior   Assessment/Plan  OSA on CPAP Narcolepsy Plan Continue on CPAP at bedtime. You appear to be benefiting from the treatment  Goal is to wear for at least 6 hours each night for maximal clinical benefit. Continue to work on weight loss, as the link between excess weight  and sleep apnea is well established.   Remember to establish a good bedtime routine, and work on sleep hygiene.  Limit daytime naps , avoid stimulants such as caffeine and nicotine close to bedtime, exercise daily to promote sleep quality, avoid heavy , spicy, fried , or rich foods before bed. Ensure adequate exposure to natural light during the day,establish a relaxing bedtime routine with a pleasant sleep environment ( Bedroom between 60 and 67 degrees, turn off bright lights , TV or device screens screens , consider black out curtains or white noise machines) Do not drive if sleepy. Remember to clean mask, tubing, filter, and reservoir once weekly with soapy water.  Follow up with Judson Roch NP or Dr. Halford Chessman in 6 months or before as needed.   Good luck with surgery. Continue Provigil two 200 mg tablets as needed with work, and 200 mg daily when you are off.  Please have [facility] order your CPAP while in the hospital if it os ok to use post op.  Please contact office for sooner follow up if symptoms do not improve or worsen or seek emergency care   Weight loss surgery Plan Weight loss surgery planned for next few months through [facility] Please ensure CPAP use as soon as able post op.      Magdalen Spatz, NP 05/19/2020  3:37 PM

## 2020-05-20 ENCOUNTER — Ambulatory Visit (INDEPENDENT_AMBULATORY_CARE_PROVIDER_SITE_OTHER): Payer: 59 | Admitting: Professional

## 2020-05-20 DIAGNOSIS — F411 Generalized anxiety disorder: Secondary | ICD-10-CM | POA: Diagnosis not present

## 2020-05-24 MED FILL — SAVELLA 50 MG TABLET: 50 | 30 days supply | Qty: 60 | Fill #1

## 2020-05-25 ENCOUNTER — Encounter: Payer: Self-pay | Admitting: Family Medicine

## 2020-05-25 ENCOUNTER — Other Ambulatory Visit: Payer: Self-pay | Admitting: Family Medicine

## 2020-05-25 MED ORDER — SAVELLA 50 MG PO TABS: 50.0000 mg | ORAL_TABLET | Freq: Two times a day (BID) | ORAL | 1 refills | Status: DC

## 2020-05-25 MED ORDER — VITAMIN D (ERGOCALCIFEROL) 1.25 MG (50000 UNIT) PO CAPS: 50000.0000 [IU] | ORAL_CAPSULE | ORAL | 1 refills | Status: DC

## 2020-05-25 MED FILL — VIT D2 1.25 MG (50,000 UNIT: 1.25 MG | 84 days supply | Qty: 12 | Fill #0

## 2020-05-26 DIAGNOSIS — Z01419 Encounter for gynecological examination (general) (routine) without abnormal findings: Secondary | ICD-10-CM | POA: Diagnosis not present

## 2020-05-26 DIAGNOSIS — Z6841 Body Mass Index (BMI) 40.0 and over, adult: Secondary | ICD-10-CM | POA: Diagnosis not present

## 2020-05-26 DIAGNOSIS — J449 Chronic obstructive pulmonary disease, unspecified: Secondary | ICD-10-CM | POA: Insufficient documentation

## 2020-05-26 LAB — HM PAP SMEAR: HM Pap smear: NEGATIVE

## 2020-05-31 ENCOUNTER — Other Ambulatory Visit: Payer: Self-pay | Admitting: Family Medicine

## 2020-05-31 MED FILL — GABAPENTIN 300 MG CAPSULE: 300 | 90 days supply | Qty: 180 | Fill #0

## 2020-06-01 ENCOUNTER — Other Ambulatory Visit (HOSPITAL_COMMUNITY): Payer: Self-pay | Admitting: Gastroenterology

## 2020-06-01 ENCOUNTER — Other Ambulatory Visit: Payer: Self-pay | Admitting: Physician Assistant

## 2020-06-01 DIAGNOSIS — K5904 Chronic idiopathic constipation: Secondary | ICD-10-CM | POA: Diagnosis not present

## 2020-06-01 DIAGNOSIS — K625 Hemorrhage of anus and rectum: Secondary | ICD-10-CM | POA: Diagnosis not present

## 2020-06-01 DIAGNOSIS — K429 Umbilical hernia without obstruction or gangrene: Secondary | ICD-10-CM

## 2020-06-01 DIAGNOSIS — Z800 Family history of malignant neoplasm of digestive organs: Secondary | ICD-10-CM | POA: Diagnosis not present

## 2020-06-09 MED FILL — RANOLAZINE ER 500 MG TABLET: 500 | 30 days supply | Qty: 60 | Fill #1

## 2020-06-10 ENCOUNTER — Ambulatory Visit
Admission: RE | Admit: 2020-06-10 | Discharge: 2020-06-10 | Disposition: A | Discharge status: Home / Self Care | Payer: 59 | Source: Ambulatory Visit | Attending: Physician Assistant | Admitting: Physician Assistant

## 2020-06-10 DIAGNOSIS — K429 Umbilical hernia without obstruction or gangrene: Secondary | ICD-10-CM

## 2020-06-11 ENCOUNTER — Ambulatory Visit (INDEPENDENT_AMBULATORY_CARE_PROVIDER_SITE_OTHER): Payer: 59 | Admitting: Professional

## 2020-06-11 DIAGNOSIS — F411 Generalized anxiety disorder: Secondary | ICD-10-CM | POA: Diagnosis not present

## 2020-06-16 DIAGNOSIS — I10 Essential (primary) hypertension: Secondary | ICD-10-CM | POA: Diagnosis not present

## 2020-06-16 DIAGNOSIS — G4733 Obstructive sleep apnea (adult) (pediatric): Secondary | ICD-10-CM | POA: Diagnosis not present

## 2020-06-22 DIAGNOSIS — E782 Mixed hyperlipidemia: Secondary | ICD-10-CM | POA: Diagnosis not present

## 2020-06-22 LAB — COMPREHENSIVE METABOLIC PANEL
ALT: 19 IU/L (ref 0–32)
AST: 19 IU/L (ref 0–40)
Albumin/Globulin Ratio: 1.7 (ref 1.2–2.2)
Albumin: 4.7 g/dL (ref 3.8–4.9)
Alkaline Phosphatase: 100 IU/L (ref 44–121)
BUN/Creatinine Ratio: 11 (ref 9–23)
BUN: 12 mg/dL (ref 6–24)
Bilirubin Total: <0.2 mg/dL (ref 0.0–1.2)
CO2: 21 mmol/L (ref 20–29)
Calcium: 10.4 mg/dL — ABNORMAL HIGH (ref 8.7–10.2)
Chloride: 99 mmol/L (ref 96–106)
Creatinine, Ser: 1.1 mg/dL — ABNORMAL HIGH (ref 0.57–1.00)
Globulin, Total: 2.7 g/dL (ref 1.5–4.5)
Glucose: 99 mg/dL (ref 65–99)
Potassium: 4.7 mmol/L (ref 3.5–5.2)
Sodium: 137 mmol/L (ref 134–144)
Total Protein: 7.4 g/dL (ref 6.0–8.5)
eGFR: 58 mL/min/{1.73_m2} — ABNORMAL LOW (ref >59)

## 2020-06-22 LAB — LIPID PANEL
Chol/HDL Ratio: 6.2 ratio — ABNORMAL HIGH (ref 0.0–4.4)
Cholesterol, Total: 253 mg/dL — ABNORMAL HIGH (ref 100–199)
HDL: 41 mg/dL (ref >39)
LDL Chol Calc (NIH): 175 mg/dL — ABNORMAL HIGH (ref 0–99)
Triglycerides: 198 mg/dL — ABNORMAL HIGH (ref 0–149)
VLDL Cholesterol Cal: 37 mg/dL (ref 5–40)

## 2020-06-22 LAB — CK: Total CK: 186 U/L — ABNORMAL HIGH (ref 32–182)

## 2020-06-23 ENCOUNTER — Ambulatory Visit (INDEPENDENT_AMBULATORY_CARE_PROVIDER_SITE_OTHER): Payer: 59 | Admitting: Professional

## 2020-06-23 DIAGNOSIS — F411 Generalized anxiety disorder: Secondary | ICD-10-CM | POA: Diagnosis not present

## 2020-06-25 MED FILL — SAVELLA 50 MG TABLET: 50 | 30 days supply | Qty: 60 | Fill #0

## 2020-06-29 ENCOUNTER — Telehealth (INDEPENDENT_AMBULATORY_CARE_PROVIDER_SITE_OTHER): Payer: 59 | Admitting: Psychiatry

## 2020-06-29 ENCOUNTER — Other Ambulatory Visit (HOSPITAL_COMMUNITY): Payer: Self-pay | Admitting: Psychiatry

## 2020-06-29 ENCOUNTER — Encounter (HOSPITAL_COMMUNITY): Payer: Self-pay | Admitting: Psychiatry

## 2020-06-29 DIAGNOSIS — M797 Fibromyalgia: Secondary | ICD-10-CM | POA: Diagnosis not present

## 2020-06-29 DIAGNOSIS — F411 Generalized anxiety disorder: Secondary | ICD-10-CM | POA: Diagnosis not present

## 2020-06-29 MED ORDER — BUSPIRONE HCL 7.5 MG PO TABS: 7.5000 mg | ORAL_TABLET | Freq: Every day | ORAL | 1 refills | Status: DC

## 2020-06-29 MED FILL — busPIRone HCL 7.5 MG TABS: 7.5 | 30 days supply | Qty: 30 | Fill #0

## 2020-06-29 NOTE — Progress Notes (Signed)
Psychiatric Initial Adult Assessment   Patient Identification: [name] MRN:  [id number] Date of Evaluation:  06/29/2020 Referral Source: Primary care Chief Complaint:  establish care, anxiety Visit Diagnosis:    ICD-10-CM   1. GAD (generalized anxiety disorder)  F41.1   2. Fibromyalgia  M79.7    Virtual Visit via Video Note  I connected with [name] on 06/29/20 at 11:00 AM EST by a video enabled telemedicine application and verified that I am speaking with the correct person using two identifiers.  Location: Patient: home  Provider: office   I discussed the limitations of evaluation and management by telemedicine and the availability of in person appointments. The patient expressed understanding and agreed to proceed.     I discussed the assessment and treatment plan with the patient. The patient was provided an opportunity to ask questions and all were answered. The patient agreed with the plan and demonstrated an understanding of the instructions.   The patient was advised to call back or seek an in-person evaluation if the symptoms worsen or if the condition fails to improve as anticipated.  I provided 30  minutes of non-face-to-face time during this encounter.   Merian Capron, MD  History of Present Illness: Patient is a 60 years old female lives with her significant other referred by primary care physician for establish care for anxiety.  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  Does not have any kids  She endorses suffering from anxiety since adulthood endorses worries excessive at times she feels her mind is racing and unreasonable worries that makes her mind fatigue.  She has been on Valium and Xanax in the past and also Effexor did not work.  Currently she has been started on Savella for fibromyalgia.  She also endorses some depressed days but not on a regular basis does not feel the depression is a concern but her anxiety is  does not endorse panic-like attacks but she worries about things and worries about job stress related to seeing a lot of people dying because of Covid as she was monitoring the heart rate.  She is also planning to have gastric bypass surgery in April and plans to follow-up with Francie Massing.  Does not endorse symptoms of depression on a day-to-day basis or hopelessness or suicidal thoughts  Does not endorse manic symptoms psychotic symptoms  Her significant other relationship is going on well.    Denies using alcohol or drugs  Denies past psych admission with suicide attempt  She follows with orthopedic and primary care for her back condition and fibromyalgia she has been on injections in the past.  Her height is 5 feet 4 inches and weight is 254 pounds she plans to do gastric bypass surgery so that it can help indirectly her comorbid medical condition during hypertension cholesterol or back condition  She uses a CPAP machine at night  Aggravating factors; job stress seeing people dying because of Covid  Modifying factors; her significant other  Duration since adult life  Severity fluctuates but recently has been noticing more anxiety and excessive worries      Past Psychiatric History: anxiety  Previous Psychotropic Medications: Yes  Effexor, xanax Substance Abuse History in the last 12 months:  No.  Consequences of Substance Abuse: NA  Past Medical History:  Past Medical History:  Diagnosis Date  . Acute bronchitis 05/25/2016  . Anxiety   . Chronic pain syndrome 01/06/2013  . Chronic rhinosinusitis   .  Colon polyps   . Cough 01/06/2013  . Depression   . Diabetes ([place]) 01/06/2013  . Diabetes mellitus type 2 in obese [facility]) 01/06/2013   Sees Dr Calvert Cantor for eye exam Does not see podiatry, foot exam today unremarkable except for thick cracking skin on heals   . Dysuria 05/25/2016  . Edema 01/06/2013  . Epistaxis 05/25/2016  . Fibroid, uterine   . GERD  (gastroesophageal reflux disease)   . Glaucoma 12-13  . Hoarseness   . Hoarseness of voice 05/11/2013  . Hyperglycemia 04/13/2013  . Hyperlipemia, mixed 06/06/2007   Qualifier: Diagnosis of  By: Wynona Luna She feels Lipitor caused increased low back pain and weakness    . Hyperlipidemia   . Hypertension   . Hypokalemia 02/05/2013  . IBS (irritable bowel syndrome) 02/13/2011  . Low back pain 03/03/2013  . Muscle cramp 05/22/2014  . Obesity   . Obesity, unspecified 05/11/2013  . OSA (obstructive sleep apnea)   . Pain in joint, shoulder region 06/27/2015  . Perimenopause 10/05/2012  . Raynaud disease 06/16/2013  . Thyroid disease    hypothyroidism  . Vocal fold nodules     Past Surgical History:  Procedure Laterality Date  . ABDOMINAL HYSTERECTOMY    . BREAST EXCISIONAL BIOPSY Right    at age 58  . CHOLECYSTECTOMY    . CYSTECTOMY    . DILATION AND CURETTAGE OF UTERUS     x2  . I & D EXTREMITY Left 04/11/2018   Procedure: DEBIDMENT DISTAL INTERPHALANGEAL LEFT MIDDLE;  Surgeon: Daryll Brod, MD;  Location: [place];  Service: Orthopedics;  Laterality: Left;  Marland Kitchen MASS EXCISION Left 04/11/2018   Procedure: EXCISION MASS;  Surgeon: Daryll Brod, MD;  Location: [place];  Service: Orthopedics;  Laterality: Left;  . OOPHORECTOMY    . POLYPECTOMY    . ROTATOR CUFF REPAIR      Family Psychiatric History: dad : alcohol use  Family History:  Family History  Problem Relation Age of Onset  . Alcohol abuse Father   . Cancer Father        renal and colon  . Hyperlipidemia Father   . Hypertension Father   . Stroke Father   . Heart disease Father   . Cancer Paternal Grandfather        colon  . Diabetes Other   . High blood pressure Mother   . High Cholesterol Mother   . Kidney disease Mother   . Depression Mother   . Anxiety disorder Mother   . Obesity Mother   . Breast cancer Other 45  . Diabetes Sister   . Diabetes Brother     Social  History:   Social History   Socioeconomic History  . Marital status: Single    Spouse name: Not on file  . Number of children: 0  . Years of education: Not on file  . Highest education level: Not on file  Occupational History  . 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 Smoking status: Former Smoker    Packs/day: 0.75    Years: 42.00    Pack years: 31.50    Types: Cigarettes    Quit date: 04/24/2005    Years since quitting: 15.1  . Smokeless tobacco: Never Used  . Tobacco comment: smoked since age 40  Vaping Use  . Vaping Use: Never used  Substance and Sexual Activity  . Alcohol use: No  . Drug use: Never  .  Sexual activity: Yes    Partners: Male  Other Topics Concern  . Not on file  Social History Narrative   Endo-- Dr Dwyane Dee   ENT--Dr shoemaker   GI--Dr [place]   Pulm--Dr Clance   Rheum--Dr Charlestine Night         Social Determinants of Health   Financial Resource Strain: Not on file  Food Insecurity: Not on file  Transportation Needs: Not on file  Physical Activity: Not on file  Stress: Not on file  Social Connections: Not on file    Additional Social History: grew up with parents, they would fuss but no abuse towards patient   Allergies:   Allergies  Allergen Reactions  . Crestor [Rosuvastatin] Other (See Comments)    Myalgias and rising CPK  . Losartan Cough  . Wellbutrin [Bupropion] Other (See Comments)    Dizziness and mental status changes  . Atorvastatin     myalgias  . Codeine   . Amoxicillin Rash  . Aspirin Nausea Only and Other (See Comments)    GI upset REACTION: Upset stomach  . Erythromycin Rash and Other (See Comments)  . Penicillins Rash    Metabolic Disorder Labs: Lab Results  Component Value Date   HGBA1C 6.4 (H) 02/13/2020   MPG 137 02/13/2020   MPG 131 (H) 09/25/2013   No results found for: PROLACTIN Lab Results  Component Value Date   CHOL 253 (H) 06/22/2020   TRIG 198 (H) 06/22/2020   HDL 41 06/22/2020   CHOLHDL  6.2 (H) 06/22/2020   VLDL 43.2 (H) 11/11/2019   LDLCALC 175 (H) 06/22/2020   LDLCALC 138 (H) 02/13/2020   Lab Results  Component Value Date   TSH 1.02 02/13/2020    Therapeutic Level Labs: No results found for: LITHIUM No results found for: CBMZ No results found for: VALPROATE  Current Medications: Current Outpatient Medications  Medication Sig Dispense Refill  . busPIRone (BUSPAR) 7.5 MG tablet Take 1 tablet (7.5 mg total) by mouth daily. 30 tablet 1  . acetaminophen (TYLENOL) 325 MG tablet Take 650 mg by mouth every 6 (six) hours as needed.    Marland Kitchen albuterol (VENTOLIN HFA) 108 (90 Base) MCG/ACT inhaler Inhale 2 puffs into the lungs every 4 (four) hours as needed for wheezing or shortness of breath. 18 g 5  . ARMOUR THYROID 90 MG tablet Take 90 mg by mouth daily.    Marland Kitchen aspirin EC 81 MG tablet Take 1 tablet (81 mg total) by mouth daily.    . Azelaic Acid 15 % cream SMARTSIG:Sparingly Topical Twice Daily    . Cod Liver Oil 1000 MG CAPS Take 1 capsule by mouth daily.    . diphenhydrAMINE (BENADRYL) 25 MG tablet Take 25 mg by mouth every 6 (six) hours as needed.    . ezetimibe (ZETIA) 10 MG tablet Take 1 tablet (10 mg total) by mouth daily. 90 tablet 3  . FREESTYLE LITE test strip USE TO CHECK BLOOD SUGAR EVERY DAY AND AS NEEDED 100 strip 5  . furosemide (LASIX) 20 MG tablet TAKE 1 TABLET (20 MG TOTAL) BY MOUTH DAILY AS NEEDED. 90 tablet 1  . gabapentin (NEURONTIN) 100 MG capsule Take 2 capsules (200 mg total) by mouth 2 (two) times daily. 120 capsule 1  . gabapentin (NEURONTIN) 300 MG capsule TAKE 1-2 CAPSULES (300-600 MG TOTAL) BY MOUTH AT BEDTIME. 180 capsule 1  . hyoscyamine (LEVSIN SL) 0.125 MG SL tablet Place 1 tablet (0.125 mg total) under the tongue every 4 (four) hours  as needed. 40 tablet 1  . ipratropium (ATROVENT) 0.06 % nasal spray Place 2 sprays into the nose 3 (three) times daily as needed.    . loratadine (CLARITIN) 10 MG tablet Take 10 mg by mouth daily.    . metFORMIN  (GLUCOPHAGE) 1000 MG tablet Take 0.5 tablets (500 mg total) by mouth daily with breakfast. 45 tablet 0  . metoprolol succinate (TOPROL-XL) 100 MG 24 hr tablet Take 1 tablet (100 mg total) by mouth daily. Take with or immediately following a meal. (Patient taking differently: Take 100 mg by mouth daily. Take 50mg  in morning and 100mg  at bedtime) 90 tablet 3  . Milnacipran (SAVELLA) 50 MG TABS tablet Take 1 tablet (50 mg total) by mouth 2 (two) times daily. 180 tablet 1  . modafinil (PROVIGIL) 200 MG tablet Take 2 tablets (400 mg total) by mouth daily. 180 tablet 1  . Prenatal Vit-Fe Fumarate-FA (M-VIT PO) Take by mouth.    . ranolazine (RANEXA) 500 MG 12 hr tablet Take 1 tablet (500 mg total) by mouth 2 (two) times daily. 60 tablet 3  . spironolactone (ALDACTONE) 50 MG tablet Take 1 tablet (50 mg total) by mouth 3 (three) times daily. 270 tablet 3  . TRULANCE 3 MG TABS Take 1 tablet by mouth daily.    . Vitamin D, Ergocalciferol, (DRISDOL) 1.25 MG (50000 UNIT) CAPS capsule Take 1 capsule (50,000 Units total) by mouth every 7 (seven) days. 12 capsule 1   No current facility-administered medications for this visit.     Psychiatric Specialty Exam: Review of Systems  Psychiatric/Behavioral: Negative for agitation and self-injury. The patient is nervous/anxious.     There were no vitals taken for this visit.There is no height or weight on file to calculate BMI.  General Appearance: Casual  Eye Contact:  Fair  Speech:  Slow  Volume:  Decreased  Mood:  Euthymic  Affect:  Constricted  Thought Process:  Goal Directed  Orientation:  Full (Time, Place, and Person)  Thought Content:  Rumination  Suicidal Thoughts:  No  Homicidal Thoughts:  No  Memory:  Immediate;   Fair Recent;   Fair  Judgement:  Fair  Insight:  Fair  Psychomotor Activity:  Decreased  Concentration:  Concentration: Fair and Attention Span: Fair  Recall:  [employer] of Knowledge:Fair  Language: Fair  Akathisia:  No   Handed:    AIMS (if indicated):  not done  Assets:  Desire for Improvement Financial Resources/Insurance  ADL's:  Intact  Cognition: WNL  Sleep:  Fair   Screenings: GAD-7   Flowsheet Row Office Visit from 03/22/2015 in [facility] at [employer]  Total GAD-7 Score 0    PHQ2-9   [place] Video Visit from 06/29/2020 in [place] Nutrition from [facility]/02/2020 in [facility] Office Visit from 07/03/2017 in [place]  PHQ-2 Total Score 1 0 0  PHQ-9 Total Score -- -- 4    Flowsheet Row Video Visit from 06/29/2020 in [place] No Risk      Assessment and Plan:  As follows  GAD: taking savella for fibromyalgia, recently started, will start small dose of buspar 7.5mg  qd, discused to consider therapy as of now she will follow up with Trellis Moment and has surgery planned for gastric bypass late April  Discussed ME time and distractions  Fibromyalgia. On Savella, follows with pcp.  Fu 4 - 6 weeks or earlier if needed     Merian Capron, MD 3/8/202211:35 AM

## 2020-06-30 ENCOUNTER — Other Ambulatory Visit: Payer: Self-pay

## 2020-06-30 ENCOUNTER — Encounter: Payer: Self-pay | Admitting: Internal Medicine

## 2020-06-30 ENCOUNTER — Ambulatory Visit: Payer: 59 | Admitting: Internal Medicine

## 2020-06-30 ENCOUNTER — Other Ambulatory Visit: Payer: Self-pay | Admitting: Internal Medicine

## 2020-06-30 VITALS — BP 122/74 | HR 85 | Ht 64.5 in | Wt 244.6 lb

## 2020-06-30 DIAGNOSIS — I2510 Atherosclerotic heart disease of native coronary artery without angina pectoris: Secondary | ICD-10-CM

## 2020-06-30 DIAGNOSIS — E782 Mixed hyperlipidemia: Secondary | ICD-10-CM | POA: Diagnosis not present

## 2020-06-30 DIAGNOSIS — G720 Drug-induced myopathy: Secondary | ICD-10-CM

## 2020-06-30 DIAGNOSIS — T466X5D Adverse effect of antihyperlipidemic and antiarteriosclerotic drugs, subsequent encounter: Secondary | ICD-10-CM | POA: Diagnosis not present

## 2020-06-30 MED ORDER — REPATHA SURECLICK 140 MG/ML ~~LOC~~ SOAJ: 1.0000 | SUBCUTANEOUS | 3 refills | Status: DC

## 2020-06-30 MED FILL — SPIRONOLACTONE 50 MG TABS: 50 | 90 days supply | Qty: 270 | Fill #3

## 2020-06-30 NOTE — Progress Notes (Signed)
LIPID CLINIC CONSULT NOTE  Chief Complaint:  Follow-up dyslipidemia  Primary Care Physician: Mosie Lukes, MD  Primary Cardiologist:  Berniece Salines, DO  HPI:  [name] is a 60 y.o. female who is being seen today for the evaluation of dyslipidemia at the request of Mosie Lukes, MD.  This is a pleasant 59 year old female here for evaluation management of dyslipidemia.  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  More recently she struggled with weight and is actually being evaluated by Dr. [name] for bariatric surgery.  In addition her cholesterol has been elevated, more recently total cholesterol 214, HDL 44, triglycerides 182 and LDL 138.  She denies any prior history of coronary disease although does have risk factors including hypertension, type 2 diabetes and a family history of heart disease.  She had previously tried a number of statins but is struggled with myalgias and possibly has fibromyalgia.  She had seen a rheumatologist for work-up for elevated CKs.  At one point she had a CK up in the 2400 but in general his CK tends to be around in the mid 200s without any statin therapy.  Currently she is not on any treatments.  06/30/2020  [name] seen today for follow-up.  Overall her cholesterol unfortunately has gone up.  She had to stop her statin and her ezetimibe.  The statin she felt was causing worsening skeletal myopathy.  Her CKs have ranged in the 200s.  Recent recheck does show some improvement with the CK now of 186.  LDL cholesterol however has risen from 138 up to 175, with total cholesterol 253, triglycerides 148 and HDL 41.  She understands the importance of lowering her cholesterol further.  Her liver enzymes have been normal.  We discussed today about the possibility of a PCSK9 inhibitor which I think is necessary given her high cholesterol.  I wonder if she could have a familial hyperlipidemia however I suspect primarily that  this is dietary.  She is interested in [facility] testing and requested a genetic test today.  She is planning on pursuing weight loss therapy.  She does have known coronary artery disease as previously mentioned, however it was mild with 0 coronary calcium and a small amount of noncalcified plaque in the RCA.  Seen on a coronary CT in January 2022.  She was also describing recurrent chest pain symptoms.  She is on Raynaud's is seen but does not report it helped her symptoms.  She is scheduled to follow-up with Dr. Harriet Masson on March 24.  PMHx:  Past Medical History:  Diagnosis Date  . Acute bronchitis 05/25/2016  . Anxiety   . Chronic pain syndrome 01/06/2013  . Chronic rhinosinusitis   . Colon polyps   . Cough 01/06/2013  . Depression   . Diabetes ([place]) 01/06/2013  . Diabetes mellitus type 2 in obese [facility]) 01/06/2013   Sees Dr Calvert Cantor for eye exam Does not see podiatry, foot exam today unremarkable except for thick cracking skin on heals   . Dysuria 05/25/2016  . Edema 01/06/2013  . Epistaxis 05/25/2016  . Fibroid, uterine   . GERD (gastroesophageal reflux disease)   . Glaucoma 12-13  . Hoarseness   . Hoarseness of voice 05/11/2013  . Hyperglycemia 04/13/2013  . Hyperlipemia, mixed 06/06/2007   Qualifier: Diagnosis of  By: Wynona Luna She feels Lipitor caused increased low back pain and weakness    . Hyperlipidemia   .  Hypertension   . Hypokalemia 02/05/2013  . IBS (irritable bowel syndrome) 02/13/2011  . Low back pain 03/03/2013  . Muscle cramp 05/22/2014  . Obesity   . Obesity, unspecified 05/11/2013  . OSA (obstructive sleep apnea)   . Pain in joint, shoulder region 06/27/2015  . Perimenopause 10/05/2012  . Raynaud disease 06/16/2013  . Thyroid disease    hypothyroidism  . Vocal fold nodules     Past Surgical History:  Procedure Laterality Date  . ABDOMINAL HYSTERECTOMY    . BREAST EXCISIONAL BIOPSY Right    at age 48  . CHOLECYSTECTOMY    . CYSTECTOMY    . DILATION AND CURETTAGE OF  UTERUS     x2  . I & D EXTREMITY Left 04/11/2018   Procedure: DEBIDMENT DISTAL INTERPHALANGEAL LEFT MIDDLE;  Surgeon: Daryll Brod, MD;  Location: [place];  Service: Orthopedics;  Laterality: Left;  Marland Kitchen MASS EXCISION Left 04/11/2018   Procedure: EXCISION MASS;  Surgeon: Daryll Brod, MD;  Location: [place];  Service: Orthopedics;  Laterality: Left;  . OOPHORECTOMY    . POLYPECTOMY    . ROTATOR CUFF REPAIR      FAMHx:  Family History  Problem Relation Age of Onset  . Alcohol abuse Father   . Cancer Father        renal and colon  . Hyperlipidemia Father   . Hypertension Father   . Stroke Father   . Heart disease Father   . Cancer Paternal Grandfather        colon  . Diabetes Other   . High blood pressure Mother   . High Cholesterol Mother   . Kidney disease Mother   . Depression Mother   . Anxiety disorder Mother   . Obesity Mother   . Breast cancer Other 45  . Diabetes Sister   . Diabetes Brother     SOCHx:   reports that she quit smoking about 15 years ago. Her smoking use included cigarettes. She has a 31.50 pack-year smoking history. She has never used smokeless tobacco. She reports that she does not drink alcohol and does not use drugs.  ALLERGIES:  Allergies  Allergen Reactions  . Crestor [Rosuvastatin] Other (See Comments)    Myalgias and rising CPK  . Losartan Cough  . Wellbutrin [Bupropion] Other (See Comments)    Dizziness and mental status changes  . Atorvastatin     myalgias  . Codeine   . Amoxicillin Rash  . Aspirin Nausea Only and Other (See Comments)    GI upset REACTION: Upset stomach  . Erythromycin Rash and Other (See Comments)  . Penicillins Rash    ROS: Pertinent items noted in HPI and remainder of comprehensive ROS otherwise negative.  HOME MEDS: Current Outpatient Medications on File Prior to Visit  Medication Sig Dispense Refill  . acetaminophen (TYLENOL) 325 MG tablet Take 650 mg by mouth every 6  (six) hours as needed.    Marland Kitchen albuterol (VENTOLIN HFA) 108 (90 Base) MCG/ACT inhaler Inhale 2 puffs into the lungs every 4 (four) hours as needed for wheezing or shortness of breath. 18 g 5  . ARMOUR THYROID 90 MG tablet Take 90 mg by mouth daily.    Marland Kitchen aspirin EC 81 MG tablet Take 1 tablet (81 mg total) by mouth daily.    . Azelaic Acid 15 % cream SMARTSIG:Sparingly Topical Twice Daily    . busPIRone (BUSPAR) 7.5 MG tablet Take 1 tablet (7.5 mg total) by mouth daily.  30 tablet 1  . Cod Liver Oil 1000 MG CAPS Take 1 capsule by mouth daily.    . diphenhydrAMINE (BENADRYL) 25 MG tablet Take 25 mg by mouth every 6 (six) hours as needed.    Marland Kitchen FREESTYLE LITE test strip USE TO CHECK BLOOD SUGAR EVERY DAY AND AS NEEDED 100 strip 5  . furosemide (LASIX) 20 MG tablet TAKE 1 TABLET (20 MG TOTAL) BY MOUTH DAILY AS NEEDED. 90 tablet 1  . gabapentin (NEURONTIN) 100 MG capsule Take 2 capsules (200 mg total) by mouth 2 (two) times daily. 120 capsule 1  . gabapentin (NEURONTIN) 300 MG capsule TAKE 1-2 CAPSULES (300-600 MG TOTAL) BY MOUTH AT BEDTIME. 180 capsule 1  . hyoscyamine (LEVSIN SL) 0.125 MG SL tablet Place 1 tablet (0.125 mg total) under the tongue every 4 (four) hours as needed. 40 tablet 1  . ipratropium (ATROVENT) 0.06 % nasal spray Place 2 sprays into the nose 3 (three) times daily as needed.    . loratadine (CLARITIN) 10 MG tablet Take 10 mg by mouth daily.    . metFORMIN (GLUCOPHAGE) 1000 MG tablet Take 0.5 tablets (500 mg total) by mouth daily with breakfast. 45 tablet 0  . metoprolol succinate (TOPROL-XL) 100 MG 24 hr tablet Take 1 tablet (100 mg total) by mouth daily. Take with or immediately following a meal. (Patient taking differently: Take 100 mg by mouth daily. Take 50mg  in morning and 100mg  at bedtime) 90 tablet 3  . Milnacipran (SAVELLA) 50 MG TABS tablet Take 1 tablet (50 mg total) by mouth 2 (two) times daily. 180 tablet 1  . modafinil (PROVIGIL) 200 MG tablet Take 2 tablets (400 mg total)  by mouth daily. 180 tablet 1  . Prenatal Vit-Fe Fumarate-FA (M-VIT PO) Take by mouth.    . ranolazine (RANEXA) 500 MG 12 hr tablet Take 1 tablet (500 mg total) by mouth 2 (two) times daily. 60 tablet 3  . spironolactone (ALDACTONE) 50 MG tablet Take 1 tablet (50 mg total) by mouth 3 (three) times daily. 270 tablet 3  . TRULANCE 3 MG TABS Take 1 tablet by mouth daily.    . Vitamin D, Ergocalciferol, (DRISDOL) 1.25 MG (50000 UNIT) CAPS capsule Take 1 capsule (50,000 Units total) by mouth every 7 (seven) days. 12 capsule 1  . ezetimibe (ZETIA) 10 MG tablet Take 1 tablet (10 mg total) by mouth daily. (Patient not taking: Reported on 06/30/2020) 90 tablet 3   No current facility-administered medications on file prior to visit.    LABS/IMAGING: No results found for this or any previous visit (from the past 48 hour(s)). No results found.  LIPID PANEL:    Component Value Date/Time   CHOL 253 (H) 06/22/2020 0911   TRIG 198 (H) 06/22/2020 0911   HDL 41 06/22/2020 0911   CHOLHDL 6.2 (H) 06/22/2020 0911   CHOLHDL 4.9 02/13/2020 0857   VLDL 43.2 (H) 11/11/2019 1125   LDLCALC 175 (H) 06/22/2020 0911   LDLCALC 138 (H) 02/13/2020 0857   LDLDIRECT 159.0 11/11/2019 1125    WEIGHTS: Wt Readings from Last 3 Encounters:  06/30/20 244 lb 9.6 oz (110.9 kg)  05/19/20 259 lb 6.4 oz (117.7 kg)  05/13/20 253 lb 1.6 oz (114.8 kg)    VITALS: BP 122/74 (BP Location: Left Arm, Patient Position: Sitting)   Pulse 85   Ht 5' 4.5" (1.638 m)   Wt 244 lb 9.6 oz (110.9 kg)   SpO2 99%   BMI 41.34 kg/m   EXAM: Deferred  EKG: Deferred  ASSESSMENT: 1. Mixed dyslipidemia 2. Mild noncalcified coronary disease by cardiac CT-04/2020 3. Statin myalgias,?  Statin myopathy with elevated CKs 4. Family history of coronary disease 5. Type 2 diabetes 6. Hypertension  PLAN: 1.   [name] has had worsening LDL cholesterol now up to 175 off of statin and ezetimibe.  She stopped the ezetimibe because of 2 episodes  of facial droop which she thought was a stroke related to the medicine.  This is an unusual side effect.  I think stroke recurrent is unlikely and TIA symptoms are also unlikely.  This could have been prolapse of Bell's palsy but again it is unusual that it improves spontaneously.  I think she will need to consider a PCSK9 inhibitor at this point.  She was advised not to take statins because of persistently elevated CKs and symptoms possibly associated with that.  The Zetia was not tolerated.  We will try to get prior authorization for PCSK9 inhibitor.  Hopefully she will be approved for weight loss surgery and this may help her lipids significantly in the future.  Plan repeat lipids in about 3 to 4 months.  Pixie Casino, MD, [facility], [place] Director of the Advanced Lipid Disorders &  Cardiovascular Risk Reduction Clinic Diplomate of the American Board of Clinical Lipidology Attending Cardiologist  Direct Dial: [phone]  Fax: [phone]  Website:  [email or web address] Chantea [place] 06/30/2020, 2:51 PM

## 2020-06-30 NOTE — Patient Instructions (Signed)
Medication Instructions:  Dr. Debara Pickett recommends Repatha (PCSK9). This is an injectable cholesterol medication self-administered once every 14 days. This medication will likely need prior approval with your insurance company, which we will work on. If the medication is not approved initially, we may need to do an appeal with your insurance.   Administer medication in area of fatty tissue such as abdomen, outer thigh, back of upper arm - and rotate site with each injection Store medication in refrigerator until ready to administer - allow to sit at room temp for 30 mins - 1 hour prior to injection Dispose of medication in a SHARPS container - your pharmacy should be able to direct you on this and proper disposal   If you need co-pay assistance grant, please look into the program at healthwellfoundation.org >> disease funds >> hypercholesterolemia. This is an online application or you can call to complete. Once approved, you will provide the "pharmacy card" information to your pharmacy and they will deduct the co-pays from this grant.  If you need a co-pay card for Repatha: [email or web address] >> paying for Repatha or red box that says "Repatha Copay Card" in top right If you need a co-pay card for Praluent: WedMap.it >> starting & paying for Praluent  *If you need a refill on your cardiac medications before your next appointment, please call your pharmacy*   Lab Work: FASTING lipid panel to be  If you have labs (blood work) drawn today and your tests are completely normal, you will receive your results only by: Marland Kitchen MyChart Message (if you have MyChart) OR . A paper copy in the mail If you have any lab test that is abnormal or we need to change your treatment, we will call you to review the results.   Testing/Procedures: NONE   Follow-Up: At [facility], you and your health needs are our priority.  As part of our continuing mission to provide you with exceptional heart care, we have created  designated Provider Care Teams.  These Care Teams include your primary Cardiologist (physician) and Advanced Practice Providers (APPs -  Physician Assistants and Nurse Practitioners) who all work together to provide you with the care you need, when you need it.  We recommend signing up for the patient portal called "MyChart".  Sign up information is provided on this After Visit Summary.  MyChart is used to connect with patients for Virtual Visits (Telemedicine).  Patients are able to view lab/test results, encounter notes, upcoming appointments, etc.  Non-urgent messages can be sent to your provider as well.   To learn more about what you can do with MyChart, go to [email or web address].    Your next appointment:   3-4 month(s) - lipid clinic * please call in April to arrange your visit for June/July  The format for your next appointment:   In Person  Provider:   Raliegh Ip Mali Hilty, MD   Other Instructions

## 2020-07-01 ENCOUNTER — Telehealth: Payer: Self-pay | Admitting: Internal Medicine

## 2020-07-01 NOTE — Telephone Encounter (Signed)
PA for repatha submitted via CMM to [employer] (Key: [id number])

## 2020-07-03 LAB — LIPID PANEL
Chol/HDL Ratio: 5.6 ratio — ABNORMAL HIGH (ref 0.0–4.4)
Cholesterol, Total: 241 mg/dL — ABNORMAL HIGH (ref 100–199)
HDL: 43 mg/dL (ref >39)
LDL Chol Calc (NIH): 162 mg/dL — ABNORMAL HIGH (ref 0–99)
Triglycerides: 193 mg/dL — ABNORMAL HIGH (ref 0–149)
VLDL Cholesterol Cal: 36 mg/dL (ref 5–40)

## 2020-07-03 LAB — LIPOPROTEIN A (LPA): Lipoprotein (a): 195.5 nmol/L — ABNORMAL HIGH (ref <75.0)

## 2020-07-05 ENCOUNTER — Other Ambulatory Visit: Payer: Self-pay

## 2020-07-05 DIAGNOSIS — E782 Mixed hyperlipidemia: Secondary | ICD-10-CM

## 2020-07-05 NOTE — Telephone Encounter (Signed)
The request has been approved. The authorization is effective for a maximum of 12 fills from 07/02/2020 to 07/01/2021, as long as the member is enrolled in their current health plan.

## 2020-07-05 NOTE — Telephone Encounter (Signed)
The LP(a) is high - this is genetic and may confer increased risk of early onset heart disease. The Repatha should help lower this number some.   Dr Lemmie Evens

## 2020-07-06 ENCOUNTER — Encounter (INDEPENDENT_AMBULATORY_CARE_PROVIDER_SITE_OTHER): Payer: Self-pay

## 2020-07-06 NOTE — Telephone Encounter (Signed)
We received this mychart message. Please advise.  [name] to see if I can get a new CPAP machine before my next visit in June. Thanks

## 2020-07-07 ENCOUNTER — Ambulatory Visit (INDEPENDENT_AMBULATORY_CARE_PROVIDER_SITE_OTHER): Payer: 59 | Admitting: Professional

## 2020-07-07 DIAGNOSIS — G4733 Obstructive sleep apnea (adult) (pediatric): Secondary | ICD-10-CM | POA: Diagnosis not present

## 2020-07-07 DIAGNOSIS — I10 Essential (primary) hypertension: Secondary | ICD-10-CM | POA: Diagnosis not present

## 2020-07-07 DIAGNOSIS — F411 Generalized anxiety disorder: Secondary | ICD-10-CM | POA: Diagnosis not present

## 2020-07-12 ENCOUNTER — Encounter: Payer: 59 | Attending: General Surgery | Admitting: Skilled Nursing Facility1

## 2020-07-12 ENCOUNTER — Other Ambulatory Visit: Payer: Self-pay

## 2020-07-12 DIAGNOSIS — E1169 Type 2 diabetes mellitus with other specified complication: Secondary | ICD-10-CM | POA: Diagnosis not present

## 2020-07-12 DIAGNOSIS — E669 Obesity, unspecified: Secondary | ICD-10-CM | POA: Diagnosis not present

## 2020-07-12 NOTE — Progress Notes (Signed)
Pre-Operative Nutrition Class:  Appt start time: 3536   End time:  1830.  Patient was seen on 07/12/2020 for Pre-Operative Bariatric Surgery Education at [facility]    Surgery date: 08/10/2020 Surgery type: RYGB Start weight at [facility]: 254.9 Weight today: 243.9   The following the learning objectives were met by the patient during this course:  Identify Pre-Op Dietary Goals and will begin 2 weeks pre-operatively  Identify appropriate sources of fluids and proteins   State protein recommendations and appropriate sources pre and post-operatively  Identify Post-Operative Dietary Goals and will follow for 2 weeks post-operatively  Identify appropriate multivitamin and calcium sources  Describe the need for physical activity post-operatively and will follow MD recommendations  State when to call healthcare provider regarding medication questions or post-operative complications  Handouts given during class include:  Pre-Op Bariatric Surgery Diet Handout  Protein Shake Handout  Post-Op Bariatric Surgery Nutrition Handout  BELT Program Information Flyer  Support Group Information Flyer  [facility] Bariatric Supplements Price List  Follow-Up Plan: Patient will follow-up at [facility] 2 weeks post operatively for diet advancement per MD.

## 2020-07-13 ENCOUNTER — Telehealth (INDEPENDENT_AMBULATORY_CARE_PROVIDER_SITE_OTHER): Payer: 59 | Admitting: Family Medicine

## 2020-07-13 DIAGNOSIS — E782 Mixed hyperlipidemia: Secondary | ICD-10-CM

## 2020-07-13 DIAGNOSIS — E1169 Type 2 diabetes mellitus with other specified complication: Secondary | ICD-10-CM

## 2020-07-13 DIAGNOSIS — K219 Gastro-esophageal reflux disease without esophagitis: Secondary | ICD-10-CM | POA: Diagnosis not present

## 2020-07-13 DIAGNOSIS — K635 Polyp of colon: Secondary | ICD-10-CM

## 2020-07-13 DIAGNOSIS — I2510 Atherosclerotic heart disease of native coronary artery without angina pectoris: Secondary | ICD-10-CM

## 2020-07-13 DIAGNOSIS — F4323 Adjustment disorder with mixed anxiety and depressed mood: Secondary | ICD-10-CM | POA: Diagnosis not present

## 2020-07-13 DIAGNOSIS — Z789 Other specified health status: Secondary | ICD-10-CM

## 2020-07-13 DIAGNOSIS — R739 Hyperglycemia, unspecified: Secondary | ICD-10-CM

## 2020-07-13 DIAGNOSIS — I10 Essential (primary) hypertension: Secondary | ICD-10-CM

## 2020-07-13 DIAGNOSIS — E669 Obesity, unspecified: Secondary | ICD-10-CM

## 2020-07-13 NOTE — Assessment & Plan Note (Signed)
hgba1c acceptable, minimize simple carbs. Increase exercise as tolerated. Continue current meds 

## 2020-07-13 NOTE — Progress Notes (Signed)
MyChart Phone Visit    Virtual Visit via Phone Note   This visit type was conducted due to national recommendations for restrictions regarding the COVID-19 Pandemic (e.g. social distancing) in an effort to limit this patient's exposure and mitigate transmission in our community. This patient is at least at moderate risk for complications without adequate follow up. This format is felt to be most appropriate for this patient at this time. Physical exam was limited by quality of the phone technology used for the visit. S Chism was able to get the patient set up on a phone visit.  Patient location: Home, patient and provider are in visit Provider location: Office  I discussed the limitations of evaluation and management by telemedicine and the availability of in person appointments. The patient expressed understanding and agreed to proceed.  Visit Date: 07/13/2020  Today's healthcare provider: Penni Homans, MD     Subjective:    Patient ID: [name], female    DOB: 05/08/1960, 60 y.o.   MRN: [id number]  Chief Complaint  Patient presents with  . Follow-up  . Hypertension  . Diabetes  . Hypothyroidism    HPI Patient is in today for phone visit. Patient is now scheduled for her weight loss surgery on April 19 with Dr [name] and she is anxious to proceed. Is working with nutrition and psychiatry. She is working with lipid clinic and tolerating [place]. She is also working with cardiology. No re concern. No recent febrile illness or hospitalizations. Denies CP/palp/SOB/HA/congestion/fevers/GI or GU c/o. Taking meds as prescribed.  Past Medical History:  Diagnosis Date  . Acute bronchitis 05/25/2016  . Anxiety   . Chronic pain syndrome 01/06/2013  . Chronic rhinosinusitis   . Colon polyps   . Cough 01/06/2013  . Depression   . Diabetes ([place]) 01/06/2013  . Diabetes mellitus type 2 in obese [facility]) 01/06/2013   Sees Dr Calvert Cantor for eye exam Does not see podiatry, foot exam  today unremarkable except for thick cracking skin on heals   . Dysuria 05/25/2016  . Edema 01/06/2013  . Epistaxis 05/25/2016  . Fibroid, uterine   . GERD (gastroesophageal reflux disease)   . Glaucoma 12-13  . Hoarseness   . Hoarseness of voice 05/11/2013  . Hyperglycemia 04/13/2013  . Hyperlipemia, mixed 06/06/2007   Qualifier: Diagnosis of  By: Wynona Luna She feels Lipitor caused increased low back pain and weakness    . Hyperlipidemia   . Hypertension   . Hypokalemia 02/05/2013  . IBS (irritable bowel syndrome) 02/13/2011  . Low back pain 03/03/2013  . Muscle cramp 05/22/2014  . Obesity   . Obesity, unspecified 05/11/2013  . OSA (obstructive sleep apnea)   . Pain in joint, shoulder region 06/27/2015  . Perimenopause 10/05/2012  . Raynaud disease 06/16/2013  . Thyroid disease    hypothyroidism  . Vocal fold nodules     Past Surgical History:  Procedure Laterality Date  . ABDOMINAL HYSTERECTOMY    . BREAST EXCISIONAL BIOPSY Right    at age 60  . CHOLECYSTECTOMY    . CYSTECTOMY    . DILATION AND CURETTAGE OF UTERUS     x2  . I & D EXTREMITY Left 04/11/2018   Procedure: DEBIDMENT DISTAL INTERPHALANGEAL LEFT MIDDLE;  Surgeon: Daryll Brod, MD;  Location: [facility];  Service: Orthopedics;  Laterality: Left;  Marland Kitchen MASS EXCISION Left 04/11/2018   Procedure: EXCISION MASS;  Surgeon: Daryll Brod, MD;  Location: [facility];  Service: Orthopedics;  Laterality: Left;  . OOPHORECTOMY    . POLYPECTOMY    . ROTATOR CUFF REPAIR      Family History  Problem Relation Age of Onset  . Alcohol abuse Father   . Cancer Father        renal and colon  . Hyperlipidemia Father   . Hypertension Father   . Stroke Father   . Heart disease Father   . Cancer Paternal Grandfather        colon  . Diabetes Other   . High blood pressure Mother   . High Cholesterol Mother   . Kidney disease Mother   . Depression Mother   . Anxiety disorder Mother   . Obesity Mother    . Breast cancer Other 45  . Diabetes Sister   . Diabetes Brother     Social History   Socioeconomic History  . Marital status: Single    Spouse name: Not on file  . Number of children: 0  . Years of education: Not on file  . Highest education level: Not on file  Occupational History  . 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 Smoking status: Former Smoker    Packs/day: 0.75    Years: 42.00    Pack years: 31.50    Types: Cigarettes    Quit date: 04/24/2005    Years since quitting: 15.2  . Smokeless tobacco: Never Used  . Tobacco comment: smoked since age 54  Vaping Use  . Vaping Use: Never used  Substance and Sexual Activity  . Alcohol use: No  . Drug use: Never  . Sexual activity: Yes    Partners: Male  Other Topics Concern  . Not on file  Social History Narrative   Endo-- Dr Dwyane Dee   ENT--Dr shoemaker   GI--Dr [facility]   Pulm--Dr Clance   Rheum--Dr Charlestine Night         Social Determinants of Health   Financial Resource Strain: Not on file  Food Insecurity: Not on file  Transportation Needs: Not on file  Physical Activity: Not on file  Stress: Not on file  Social Connections: Not on file  Intimate Partner Violence: Not on file    Outpatient Medications Prior to Visit  Medication Sig Dispense Refill  . acetaminophen (TYLENOL) 325 MG tablet Take 650 mg by mouth every 6 (six) hours as needed.    Marland Kitchen albuterol (VENTOLIN HFA) 108 (90 Base) MCG/ACT inhaler Inhale 2 puffs into the lungs every 4 (four) hours as needed for wheezing or shortness of breath. 18 g 5  . ARMOUR THYROID 90 MG tablet Take 90 mg by mouth daily.    Marland Kitchen aspirin EC 81 MG tablet Take 1 tablet (81 mg total) by mouth daily.    . Azelaic Acid 15 % cream SMARTSIG:Sparingly Topical Twice Daily    . busPIRone (BUSPAR) 7.5 MG tablet Take 1 tablet (7.5 mg total) by mouth daily. 30 tablet 1  . Cod Liver Oil 1000 MG CAPS Take 1 capsule by mouth daily.    . diphenhydrAMINE (BENADRYL) 25 MG tablet Take  25 mg by mouth every 6 (six) hours as needed.    . Evolocumab (REPATHA SURECLICK) 448 MG/ML SOAJ Inject 1 Dose into the skin every 14 (fourteen) days. 6 mL 3  . FREESTYLE LITE test strip USE TO CHECK BLOOD SUGAR EVERY DAY AND AS NEEDED 100 strip 5  . furosemide (LASIX) 20 MG tablet TAKE 1 TABLET (20 MG TOTAL) BY  MOUTH DAILY AS NEEDED. 90 tablet 1  . gabapentin (NEURONTIN) 100 MG capsule Take 2 capsules (200 mg total) by mouth 2 (two) times daily. 120 capsule 1  . gabapentin (NEURONTIN) 300 MG capsule TAKE 1-2 CAPSULES (300-600 MG TOTAL) BY MOUTH AT BEDTIME. 180 capsule 1  . hyoscyamine (LEVSIN SL) 0.125 MG SL tablet Place 1 tablet (0.125 mg total) under the tongue every 4 (four) hours as needed. 40 tablet 1  . ipratropium (ATROVENT) 0.06 % nasal spray Place 2 sprays into the nose 3 (three) times daily as needed.    . loratadine (CLARITIN) 10 MG tablet Take 10 mg by mouth daily.    . metFORMIN (GLUCOPHAGE) 1000 MG tablet Take 0.5 tablets (500 mg total) by mouth daily with breakfast. 45 tablet 0  . metoprolol succinate (TOPROL-XL) 100 MG 24 hr tablet Take 1 tablet (100 mg total) by mouth daily. Take with or immediately following a meal. (Patient taking differently: Take 100 mg by mouth daily. Take 50mg  in morning and 100mg  at bedtime) 90 tablet 3  . Milnacipran (SAVELLA) 50 MG TABS tablet Take 1 tablet (50 mg total) by mouth 2 (two) times daily. 180 tablet 1  . modafinil (PROVIGIL) 200 MG tablet Take 2 tablets (400 mg total) by mouth daily. 180 tablet 1  . Prenatal Vit-Fe Fumarate-FA (M-VIT PO) Take by mouth.    . ranolazine (RANEXA) 500 MG 12 hr tablet Take 1 tablet (500 mg total) by mouth 2 (two) times daily. 60 tablet 3  . spironolactone (ALDACTONE) 50 MG tablet Take 1 tablet (50 mg total) by mouth 3 (three) times daily. 270 tablet 3  . TRULANCE 3 MG TABS Take 1 tablet by mouth daily.    . Vitamin D, Ergocalciferol, (DRISDOL) 1.25 MG (50000 UNIT) CAPS capsule Take 1 capsule (50,000 Units total) by  mouth every 7 (seven) days. 12 capsule 1  . ezetimibe (ZETIA) 10 MG tablet Take 1 tablet (10 mg total) by mouth daily. (Patient not taking: Reported on 06/30/2020) 90 tablet 3   No facility-administered medications prior to visit.    Allergies  Allergen Reactions  . Crestor [Rosuvastatin] Other (See Comments)    Myalgias and rising CPK  . Losartan Cough  . Wellbutrin [Bupropion] Other (See Comments)    Dizziness and mental status changes  . Atorvastatin     myalgias  . Codeine   . Amoxicillin Rash  . Aspirin Nausea Only and Other (See Comments)    GI upset REACTION: Upset stomach  . Erythromycin Rash and Other (See Comments)  . Penicillins Rash    Review of Systems  Constitutional: Positive for malaise/fatigue. Negative for chills and fever.  HENT: Negative for congestion, sinus pain and sore throat.   Eyes: Negative for pain.  Respiratory: Negative for shortness of breath.   Cardiovascular: Negative for chest pain, palpitations and leg swelling.  Gastrointestinal: Negative for abdominal pain, blood in stool, diarrhea, nausea and vomiting.  Genitourinary: Negative for flank pain and frequency.  Musculoskeletal: Positive for myalgias. Negative for back pain.  Neurological: Negative for headaches.  Psychiatric/Behavioral: Positive for depression. The patient is nervous/anxious.        Objective:    Physical Exam  BP (!) 142/98   Pulse 99   Wt 244 lb (110.7 kg)   BMI 41.24 kg/m  Wt Readings from Last 3 Encounters:  07/13/20 244 lb (110.7 kg)  07/12/20 243 lb 14.4 oz (110.6 kg)  06/30/20 244 lb 9.6 oz (110.9 kg)    Diabetic  Foot Exam - Simple   No data filed    Lab Results  Component Value Date   WBC 4.6 02/13/2020   HGB 14.1 02/13/2020   HCT 43.4 02/13/2020   PLT 343 02/13/2020   GLUCOSE 99 06/22/2020   CHOL 253 (H) 06/22/2020   CHOL 241 (H) 06/22/2020   TRIG 198 (H) 06/22/2020   TRIG 193 (H) 06/22/2020   HDL 41 06/22/2020   HDL 43 06/22/2020    LDLDIRECT 159.0 11/11/2019   LDLCALC 175 (H) 06/22/2020   LDLCALC 162 (H) 06/22/2020   ALT 19 06/22/2020   AST 19 06/22/2020   NA 137 06/22/2020   K 4.7 06/22/2020   CL 99 06/22/2020   CREATININE 1.10 (H) 06/22/2020   BUN 12 06/22/2020   CO2 21 06/22/2020   TSH 1.02 02/13/2020   HGBA1C 6.4 (H) 02/13/2020   MICROALBUR <0.7 08/19/2019    Lab Results  Component Value Date   TSH 1.02 02/13/2020   Lab Results  Component Value Date   WBC 4.6 02/13/2020   HGB 14.1 02/13/2020   HCT 43.4 02/13/2020   MCV 93.1 02/13/2020   PLT 343 02/13/2020   Lab Results  Component Value Date   NA 137 06/22/2020   K 4.7 06/22/2020   CO2 21 06/22/2020   GLUCOSE 99 06/22/2020   BUN 12 06/22/2020   CREATININE 1.10 (H) 06/22/2020   BILITOT <0.2 06/22/2020   ALKPHOS 100 06/22/2020   AST 19 06/22/2020   ALT 19 06/22/2020   PROT 7.4 06/22/2020   ALBUMIN 4.7 06/22/2020   CALCIUM 10.4 (H) 06/22/2020   ANIONGAP 10 04/04/2018   GFR 76.57 11/11/2019   Lab Results  Component Value Date   CHOL 253 (H) 06/22/2020   CHOL 241 (H) 06/22/2020   Lab Results  Component Value Date   HDL 41 06/22/2020   HDL 43 06/22/2020   Lab Results  Component Value Date   LDLCALC 175 (H) 06/22/2020   LDLCALC 162 (H) 06/22/2020   Lab Results  Component Value Date   TRIG 198 (H) 06/22/2020   TRIG 193 (H) 06/22/2020   Lab Results  Component Value Date   CHOLHDL 6.2 (H) 06/22/2020   CHOLHDL 5.6 (H) 06/22/2020   Lab Results  Component Value Date   HGBA1C 6.4 (H) 02/13/2020       Assessment & Plan:   Problem List Items Addressed This Visit    Hyperlipemia, mixed    maintain heart healthy diet, increase exercise, avoid trans fats, consider a krill oil cap daily. 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 They did add back the Buspar we had her on previously      Diabetes mellitus type 2 in obese (HCC)    hgba1c acceptable, minimize simple carbs. Increase exercise as tolerated. Continue current meds      Morbid obesity ([place])    She has her weight loss surgery scheduled on April 19 with Dr [name]. She is looking forward to proceeding. She is about to go for her pre surgery nutrition consult to learn about how to eat prior and after surgery. We will set her up for a follow up with [place] about a month  after her surgery.      Statin intolerance    As she has come off of her statins her CKs have started to improve      Hypertension    Monitor and report ant concerns, no changes to meds. Encouraged heart healthy diet such as the DASH diet and exercise as tolerated.       Hyperglycemia    minimize simple carbs. Increase exercise as tolerated.      GERD (gastroesophageal reflux disease)    Avoid offending foods, start probiotics. Do not eat large meals in late evening and consider raising head of bed.       Colon polyps    Is having them on a 5 year cycle due to personal history of polyps and FH of Colon cancer. She is getting early prior to her gastric bypass. She works with Dr Wallis Mart      Mild CAD    She has an appointment with Dr Harriet Masson soon           No orders of the defined types were placed in this encounter.   I discussed the assessment and treatment plan with the patient. The patient was provided an opportunity to ask questions and all were answered. The patient agreed with the plan and demonstrated an understanding of the instructions.   The patient was advised to call back or seek an in-person evaluation if the symptoms worsen or if the condition fails to improve as anticipated.  I provided 20 minutes of non-face-to-face time during this encounter.   Penni Homans, MD [facility] at [facility] [phone] (phone) [phone] (fax)  [place]

## 2020-07-13 NOTE — Assessment & Plan Note (Signed)
Is having them on a 5 year cycle due to personal history of polyps and FH of Colon cancer. She is getting early prior to her gastric bypass. She works with Dr Wallis Mart

## 2020-07-14 ENCOUNTER — Ambulatory Visit: Payer: 59 | Admitting: Cardiology

## 2020-07-14 NOTE — Assessment & Plan Note (Signed)
She is following with psychiatry as she gets ready for her gastric bypass surgery and she is doing well and feels ready. They did add back the Buspar we had her on previously

## 2020-07-14 NOTE — Assessment & Plan Note (Signed)
She has her weight loss surgery scheduled on April 19 with Dr [name]. She is looking forward to proceeding. She is about to go for her pre surgery nutrition consult to learn about how to eat prior and after surgery. We will set her up for a follow up with [place] about a month after her surgery.

## 2020-07-14 NOTE — Assessment & Plan Note (Signed)
As she has come off of her statins her CKs have started to improve

## 2020-07-14 NOTE — Assessment & Plan Note (Addendum)
Monitor and report ant concerns, no changes to meds. Encouraged heart healthy diet such as the DASH diet and exercise as tolerated.

## 2020-07-14 NOTE — Assessment & Plan Note (Signed)
Avoid offending foods, start probiotics. Do not eat large meals in late evening and consider raising head of bed.  

## 2020-07-14 NOTE — Assessment & Plan Note (Signed)
She has an appointment with Dr Harriet Masson soon

## 2020-07-14 NOTE — Assessment & Plan Note (Signed)
minimize simple carbs. Increase exercise as tolerated.  

## 2020-07-14 NOTE — Assessment & Plan Note (Signed)
maintain heart healthy diet, increase exercise, avoid trans fats, consider a krill oil cap daily. She is now set up with the lipid clinic and is receiving Repatha every 14 days and appears to be tolerating

## 2020-07-15 ENCOUNTER — Encounter: Payer: Self-pay | Admitting: Cardiology

## 2020-07-15 ENCOUNTER — Ambulatory Visit: Payer: 59 | Admitting: Cardiology

## 2020-07-15 ENCOUNTER — Other Ambulatory Visit: Payer: Self-pay

## 2020-07-15 VITALS — BP 140/76 | HR 82 | Ht 64.0 in | Wt 241.1 lb

## 2020-07-15 DIAGNOSIS — R7303 Prediabetes: Secondary | ICD-10-CM

## 2020-07-15 DIAGNOSIS — I2510 Atherosclerotic heart disease of native coronary artery without angina pectoris: Secondary | ICD-10-CM

## 2020-07-15 DIAGNOSIS — E782 Mixed hyperlipidemia: Secondary | ICD-10-CM

## 2020-07-15 DIAGNOSIS — G4733 Obstructive sleep apnea (adult) (pediatric): Secondary | ICD-10-CM | POA: Diagnosis not present

## 2020-07-15 DIAGNOSIS — K5904 Chronic idiopathic constipation: Secondary | ICD-10-CM | POA: Insufficient documentation

## 2020-07-15 DIAGNOSIS — I10 Essential (primary) hypertension: Secondary | ICD-10-CM

## 2020-07-15 NOTE — Patient Instructions (Signed)

## 2020-07-15 NOTE — Progress Notes (Signed)
Cardiology Office Note:    Date:  07/15/2020   ID:  [name], DOB 1961/03/11, MRN [id number]  PCP:  Mosie Lukes, MD  Cardiologist:  Berniece Salines, DO  Electrophysiologist:  None   Referring MD: Mosie Lukes, MD   I feel tightness when I am experience anxiety, I am working with my psychologist with this.  And I was recently started on BuSpar  History of Present Illness:    [name] is a 60 y.o. female with a  hx of hypertension, prediabetes, hyperlipidemia with statin intolerance in has not been able to tolerate Zetia, mild coronary artery disease recently diagnosed on coronary CTA is here today for follow-up visit.    She did see Dr. Debara Pickett in the lipid clinic and had reported that she stopped her Zetia due to episodes of facial droop which was suspected to be Bell's palsy.  Did discuss at that time for reconsideration for PCSK9 inhibitors which she agreed and she has started this.  She also requested to have genetic testing for familial hyperlipidemia during her last visit.  Today she tells me that she still is experiencing intermittent anxiety and when this happened she feels chest tightness and squeezing sensation.  No shortness of breath really says she started the BuSpar at this has help and following with her psychologist and learning tactics has also helped her.  She is looking forward to her upcoming bariatric surgery.  Past Medical History:  Diagnosis Date  . Acute bronchitis 05/25/2016  . Anxiety   . Chronic pain syndrome 01/06/2013  . Chronic rhinosinusitis   . Colon polyps   . Cough 01/06/2013  . Depression   . Diabetes ([place]) 01/06/2013  . Diabetes mellitus type 2 in obese [facility]) 01/06/2013   Sees Dr Calvert Cantor for eye exam Does not see podiatry, foot exam today unremarkable except for thick cracking skin on heals   . Dysuria 05/25/2016  . Edema 01/06/2013  . Epistaxis 05/25/2016  . Fibroid, uterine   . GERD (gastroesophageal reflux disease)   . Glaucoma  12-13  . Hoarseness   . Hoarseness of voice 05/11/2013  . Hyperglycemia 04/13/2013  . Hyperlipemia, mixed 06/06/2007   Qualifier: Diagnosis of  By: Wynona Luna She feels Lipitor caused increased low back pain and weakness    . Hyperlipidemia   . Hypertension   . Hypokalemia 02/05/2013  . IBS (irritable bowel syndrome) 02/13/2011  . Low back pain 03/03/2013  . Muscle cramp 05/22/2014  . Obesity   . Obesity, unspecified 05/11/2013  . OSA (obstructive sleep apnea)   . Pain in joint, shoulder region 06/27/2015  . Perimenopause 10/05/2012  . Raynaud disease 06/16/2013  . Thyroid disease    hypothyroidism  . Vocal fold nodules     Past Surgical History:  Procedure Laterality Date  . ABDOMINAL HYSTERECTOMY    . BREAST EXCISIONAL BIOPSY Right    at age 20  . CHOLECYSTECTOMY    . CYSTECTOMY    . DILATION AND CURETTAGE OF UTERUS     x2  . I & D EXTREMITY Left 04/11/2018   Procedure: DEBIDMENT DISTAL INTERPHALANGEAL LEFT MIDDLE;  Surgeon: Daryll Brod, MD;  Location: [place];  Service: Orthopedics;  Laterality: Left;  Marland Kitchen MASS EXCISION Left 04/11/2018   Procedure: EXCISION MASS;  Surgeon: Daryll Brod, MD;  Location: [place];  Service: Orthopedics;  Laterality: Left;  . OOPHORECTOMY    . POLYPECTOMY    . ROTATOR  CUFF REPAIR      Current Medications: Current Meds  Medication Sig  . acetaminophen (TYLENOL) 325 MG tablet Take 650 mg by mouth every 6 (six) hours as needed.  Marland Kitchen albuterol (VENTOLIN HFA) 108 (90 Base) MCG/ACT inhaler Inhale 2 puffs into the lungs every 4 (four) hours as needed for wheezing or shortness of breath.  Francia Greaves THYROID 90 MG tablet Take 90 mg by mouth daily.  Marland Kitchen aspirin EC 81 MG tablet Take 1 tablet (81 mg total) by mouth daily.  . Azelaic Acid 15 % cream SMARTSIG:Sparingly Topical Twice Daily  . busPIRone (BUSPAR) 7.5 MG tablet Take 1 tablet (7.5 mg total) by mouth daily.  Marland Kitchen Cod Liver Oil 1000 MG CAPS Take 1 capsule by mouth  daily.  . diphenhydrAMINE (BENADRYL) 25 MG tablet Take 25 mg by mouth every 6 (six) hours as needed.  . Evolocumab (REPATHA SURECLICK) 030 MG/ML SOAJ Inject 1 Dose into the skin every 14 (fourteen) days.  Marland Kitchen FREESTYLE LITE test strip USE TO CHECK BLOOD SUGAR EVERY DAY AND AS NEEDED  . furosemide (LASIX) 20 MG tablet TAKE 1 TABLET (20 MG TOTAL) BY MOUTH DAILY AS NEEDED.  Marland Kitchen gabapentin (NEURONTIN) 100 MG capsule Take 2 capsules (200 mg total) by mouth 2 (two) times daily.  Marland Kitchen gabapentin (NEURONTIN) 300 MG capsule TAKE 1-2 CAPSULES (300-600 MG TOTAL) BY MOUTH AT BEDTIME.  . hyoscyamine (LEVSIN SL) 0.125 MG SL tablet Place 1 tablet (0.125 mg total) under the tongue every 4 (four) hours as needed.  Marland Kitchen ipratropium (ATROVENT) 0.06 % nasal spray Place 2 sprays into the nose 3 (three) times daily as needed.  . loratadine (CLARITIN) 10 MG tablet Take 10 mg by mouth daily.  . metFORMIN (GLUCOPHAGE) 1000 MG tablet Take 0.5 tablets (500 mg total) by mouth daily with breakfast.  . metoprolol succinate (TOPROL-XL) 100 MG 24 hr tablet Take 1 tablet (100 mg total) by mouth daily. Take with or immediately following a meal. (Patient taking differently: Take 100 mg by mouth daily. Take 50mg  in morning and 100mg  at bedtime)  . Milnacipran (SAVELLA) 50 MG TABS tablet Take 1 tablet (50 mg total) by mouth 2 (two) times daily.  . modafinil (PROVIGIL) 200 MG tablet Take 2 tablets (400 mg total) by mouth daily.  . Prenatal Vit-Fe Fumarate-FA (M-VIT PO) Take by mouth.  . ranolazine (RANEXA) 500 MG 12 hr tablet Take 1 tablet (500 mg total) by mouth 2 (two) times daily.  Marland Kitchen spironolactone (ALDACTONE) 50 MG tablet Take 1 tablet (50 mg total) by mouth 3 (three) times daily.  . TRULANCE 3 MG TABS Take 1 tablet by mouth daily.  . Vitamin D, Ergocalciferol, (DRISDOL) 1.25 MG (50000 UNIT) CAPS capsule Take 1 capsule (50,000 Units total) by mouth every 7 (seven) days.     Allergies:   Crestor [rosuvastatin], Losartan, Wellbutrin  [bupropion], Atorvastatin, Codeine, Amoxicillin, Aspirin, Erythromycin, and Penicillins   Social History   Socioeconomic History  . Marital status: Single    Spouse name: Not on file  . Number of children: 0  . Years of education: Not on file  . Highest education level: Not on file  Occupational History  . 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 Smoking status: Former Smoker    Packs/day: 0.75    Years: 42.00    Pack years: 31.50    Types: Cigarettes    Quit date: 04/24/2005    Years since quitting: 15.2  . Smokeless tobacco: Never Used  .  Tobacco comment: smoked since age 37  Vaping Use  . Vaping Use: Never used  Substance and Sexual Activity  . Alcohol use: No  . Drug use: Never  . Sexual activity: Yes    Partners: Male  Other Topics Concern  . Not on file  Social History Narrative   Endo-- Dr Dwyane Dee   ENT--Dr shoemaker   GI--Dr Loveland   Pulm--Dr Clance   Rheum--Dr Charlestine Night         Social Determinants of Health   Financial Resource Strain: Not on file  Food Insecurity: Not on file  Transportation Needs: Not on file  Physical Activity: Not on file  Stress: Not on file  Social Connections: Not on file     Family History: The patient's family history includes Alcohol abuse in her father; Anxiety disorder in her mother; Breast cancer (age of onset: 72) in an other family member; Cancer in her father and paternal grandfather; Depression in her mother; Diabetes in her brother, sister, and another family member; Heart disease in her father; High Cholesterol in her mother; High blood pressure in her mother; Hyperlipidemia in her father; Hypertension in her father; Kidney disease in her mother; Obesity in her mother; Stroke in her father.  ROS:   Review of Systems  Constitution: Negative for decreased appetite, fever and weight gain.  HENT: Negative for congestion, ear discharge, hoarse voice and sore throat.   Eyes: Negative for discharge, redness, vision  loss in right eye and visual halos.  Cardiovascular: Negative for chest pain, dyspnea on exertion, leg swelling, orthopnea and palpitations.  Respiratory: Negative for cough, hemoptysis, shortness of breath and snoring.   Endocrine: Negative for heat intolerance and polyphagia.  Hematologic/Lymphatic: Negative for bleeding problem. Does not bruise/bleed easily.  Skin: Negative for flushing, nail changes, rash and suspicious lesions.  Musculoskeletal: Negative for arthritis, joint pain, muscle cramps, myalgias, neck pain and stiffness.  Gastrointestinal: Negative for abdominal pain, bowel incontinence, diarrhea and excessive appetite.  Genitourinary: Negative for decreased libido, genital sores and incomplete emptying.  Neurological: Negative for brief paralysis, focal weakness, headaches and loss of balance.  Psychiatric/Behavioral: Negative for altered mental status, depression and suicidal ideas.  Allergic/Immunologic: Negative for HIV exposure and persistent infections.    EKGs/Labs/Other Studies Reviewed:    The following studies were reviewed today:   EKG: None today  CCTA  IMPRESSION May 04, 2020 1. Coronary calcium score of 0. This was 1st percentile for age, sex, and race matched control.  2. Normal coronary origin. Right dominance.  3. RCA is a large dominant artery that gives rise to PDA and PLA. There is a mild (25-49%) non-obstructive soft plaque in the proximal vessel, there is a mild (25-49%) non-obstructive soft plaque distal to a step artifact in the mid vessel.  4. CAD-RADS 2. Mild non-obstructive CAD (25-49%). Consider non-atherosclerotic causes of chest pain. Consider preventive therapy and risk factor modification.  5. Atypical pulmonary vein drainage (common left pulmonary vein) into the left atrium.  Recent Labs: 02/13/2020: Hemoglobin 14.1; Platelets 343; TSH 1.02 06/22/2020: ALT 19; BUN 12; Creatinine, Ser 1.10; Potassium 4.7; Sodium 137   Recent Lipid Panel    Component Value Date/Time   CHOL 253 (H) 06/22/2020 0911   CHOL 241 (H) 06/22/2020 0911   TRIG 198 (H) 06/22/2020 0911   TRIG 193 (H) 06/22/2020 0911   HDL 41 06/22/2020 0911   HDL 43 06/22/2020 0911   CHOLHDL 6.2 (H) 06/22/2020 0911   CHOLHDL 5.6 (H) 06/22/2020 0865  CHOLHDL 4.9 02/13/2020 0857   VLDL 43.2 (H) 11/11/2019 1125   LDLCALC 175 (H) 06/22/2020 0911   LDLCALC 162 (H) 06/22/2020 0911   LDLCALC 138 (H) 02/13/2020 0857   LDLDIRECT 159.0 11/11/2019 1125    Physical Exam:    VS:  BP 140/76   Pulse 82   Ht 5\' 4"  (1.626 m)   Wt 241 lb 1.9 oz (109.4 kg)   SpO2 99%   BMI 41.39 kg/m     Wt Readings from Last 3 Encounters:  07/15/20 241 lb 1.9 oz (109.4 kg)  07/13/20 244 lb (110.7 kg)  07/12/20 243 lb 14.4 oz (110.6 kg)     GEN: Well nourished, well developed in no acute distress HEENT: Normal NECK: No JVD; No carotid bruits LYMPHATICS: No lymphadenopathy CARDIAC: S1S2 noted,RRR, no murmurs, rubs, gallops RESPIRATORY:  Clear to auscultation without rales, wheezing or rhonchi  ABDOMEN: Soft, non-tender, non-distended, +bowel sounds, no guarding. EXTREMITIES: No edema, No cyanosis, no clubbing MUSCULOSKELETAL:  No deformity  SKIN: Warm and dry NEUROLOGIC:  Alert and oriented x 3, non-focal PSYCHIATRIC:  Normal affect, good insight  ASSESSMENT:    1. Mild CAD   2. Benign essential hypertension   3. OSA (obstructive sleep apnea)   4. Hyperlipemia, mixed   5. Prediabetes   6. Morbid obesity ([place])    PLAN:    She believes the Ranexa has help 6500 mg twice a day we will keep that regimen for now.  The biggest problem right now is having the patient anxiety under control.  She also started with PCSK9 inhibitors and she is happy for now with this medication.  She has had intolerance to Zetia as well as statins.  Bariatric surgery has been scheduled for August 10, 2020 and she is looking forward to this.  The patient does not have any  unstable cardiac conditions.  Upon evaluation today, she can achieve 4 METs or greater without anginal symptoms.  According to [facility] and AHA guidelines, she requires no further cardiac workup prior to her noncardiac surgery and should be at acceptable risk.  Our service is available as necessary in the perioperative period.  Blood pressure is acceptable, continue with current antihypertensive regimen.   The patient is in agreement with the above plan. The patient left the office in stable condition.  The patient will follow up in 6 months or sooner if needed.   Medication Adjustments/Labs and Tests Ordered: Current medicines are reviewed at length with the patient today.  Concerns regarding medicines are outlined above.  No orders of the defined types were placed in this encounter.  No orders of the defined types were placed in this encounter.   Patient Instructions  Medication Instructions:  Your physician recommends that you continue on your current medications as directed. Please refer to the Current Medication list given to you today.  *If you need a refill on your cardiac medications before your next appointment, please call your pharmacy*   Lab Work: None If you have labs (blood work) drawn today and your tests are completely normal, you will receive your results only by: Marland Kitchen MyChart Message (if you have MyChart) OR . A paper copy in the mail If you have any lab test that is abnormal or we need to change your treatment, we will call you to review the results.   Testing/Procedures: None   Follow-Up: At [facility], you and your health needs are our priority.  As part of our continuing mission to provide  you with exceptional heart care, we have created designated Provider Care Teams.  These Care Teams include your primary Cardiologist (physician) and Advanced Practice Providers (APPs -  Physician Assistants and Nurse Practitioners) who all work together to provide you with the  care you need, when you need it.  We recommend signing up for the patient portal called "MyChart".  Sign up information is provided on this After Visit Summary.  MyChart is used to connect with patients for Virtual Visits (Telemedicine).  Patients are able to view lab/test results, encounter notes, upcoming appointments, etc.  Non-urgent messages can be sent to your provider as well.   To learn more about what you can do with MyChart, go to [email or web address].    Your next appointment:   6 month(s)  The format for your next appointment:   In Person  Provider:   Berniece Salines, DO   Other Instructions      Adopting a Healthy Lifestyle.  Know what a healthy weight is for you (roughly BMI <25) and aim to maintain this   Aim for 7+ servings of fruits and vegetables daily   65-80+ fluid ounces of water or unsweet tea for healthy kidneys   Limit to max 1 drink of alcohol per day; avoid smoking/tobacco   Limit animal fats in diet for cholesterol and heart health - choose grass fed whenever available   Avoid highly processed foods, and foods high in saturated/trans fats   Aim for low stress - take time to unwind and care for your mental health   Aim for 150 min of moderate intensity exercise weekly for heart health, and weights twice weekly for bone health   Aim for 7-9 hours of sleep daily   When it comes to diets, agreement about the perfect plan isnt easy to find, even among the experts. Experts at the [employer] developed an idea known as the Healthy Eating Plate. Just imagine a plate divided into logical, healthy portions.   The emphasis is on diet quality:   Load up on vegetables and fruits - one-half of your plate: Aim for color and variety, and remember that potatoes dont count.   Go for whole grains - one-quarter of your plate: Whole wheat, barley, wheat berries, quinoa, oats, brown rice, and foods made with them. If you want pasta, go with  whole wheat pasta.   Protein power - one-quarter of your plate: Fish, chicken, beans, and nuts are all healthy, versatile protein sources. Limit red meat.   The diet, however, does go beyond the plate, offering a few other suggestions.   Use healthy plant oils, such as olive, canola, soy, corn, sunflower and peanut. Check the labels, and avoid partially hydrogenated oil, which have unhealthy trans fats.   If youre thirsty, drink water. Coffee and tea are good in moderation, but skip sugary drinks and limit milk and dairy products to one or two daily servings.   The type of carbohydrate in the diet is more important than the amount. Some sources of carbohydrates, such as vegetables, fruits, whole grains, and beans-are healthier than others.   Finally, stay active  Signed, Berniece Salines, DO  07/15/2020 5:16 PM    [facility]

## 2020-07-16 ENCOUNTER — Telehealth (HOSPITAL_COMMUNITY): Payer: Self-pay

## 2020-07-16 ENCOUNTER — Other Ambulatory Visit (HOSPITAL_COMMUNITY): Payer: Self-pay | Admitting: Psychiatry

## 2020-07-16 MED ORDER — BUSPIRONE HCL 7.5 MG PO TABS: 7.5000 mg | ORAL_TABLET | Freq: Two times a day (BID) | ORAL | 1 refills | Status: DC

## 2020-07-16 NOTE — Telephone Encounter (Signed)
Patient would like to increase Buspar. Please advise  [phone]

## 2020-07-16 NOTE — Telephone Encounter (Signed)
Can increase to twice a day if needed. Can send prescription

## 2020-07-16 NOTE — Telephone Encounter (Signed)
Patient informed. Sent in change of direction to pharmacy. Nothing further is needed at this time

## 2020-07-19 MED FILL — TRULANCE 3 MG TABS: 3 | 90 days supply | Qty: 90 | Fill #1

## 2020-07-19 MED FILL — SAVELLA 50 MG TABLET: 50 | 30 days supply | Qty: 60 | Fill #1

## 2020-07-19 MED FILL — busPIRone HCL 7.5 MG TABS: 7.5 | 30 days supply | Qty: 60 | Fill #0

## 2020-07-20 MED FILL — PEG-3350 SOLUTION: 420 | 1 days supply | Qty: 4000 | Fill #0

## 2020-07-21 ENCOUNTER — Ambulatory Visit (INDEPENDENT_AMBULATORY_CARE_PROVIDER_SITE_OTHER): Payer: 59 | Admitting: Professional

## 2020-07-21 DIAGNOSIS — F411 Generalized anxiety disorder: Secondary | ICD-10-CM | POA: Diagnosis not present

## 2020-07-22 ENCOUNTER — Ambulatory Visit: Payer: Self-pay | Admitting: General Surgery

## 2020-07-22 ENCOUNTER — Encounter: Payer: Self-pay | Admitting: Family Medicine

## 2020-07-22 ENCOUNTER — Other Ambulatory Visit: Payer: Self-pay | Admitting: Family Medicine

## 2020-07-22 MED ORDER — METOPROLOL SUCCINATE ER 100 MG PO TB24: 100.0000 mg | ORAL_TABLET | Freq: Two times a day (BID) | ORAL | 1 refills | Status: DC

## 2020-07-22 NOTE — Progress Notes (Signed)
Sent message, via epic in basket, requesting orders in epic from surgeon.  

## 2020-07-27 ENCOUNTER — Other Ambulatory Visit (HOSPITAL_COMMUNITY): Payer: Self-pay

## 2020-07-27 MED ORDER — PEG 3350-KCL-NA BICARB-NACL 420 G PO SOLR
4000.0000 mL | Freq: Once | ORAL | 0 refills | Status: AC
  Filled 2020-07-27: qty 4000, 1d supply, fill #0

## 2020-07-28 DIAGNOSIS — K5730 Diverticulosis of large intestine without perforation or abscess without bleeding: Secondary | ICD-10-CM | POA: Diagnosis not present

## 2020-07-28 DIAGNOSIS — K6289 Other specified diseases of anus and rectum: Secondary | ICD-10-CM | POA: Diagnosis not present

## 2020-07-28 DIAGNOSIS — K625 Hemorrhage of anus and rectum: Secondary | ICD-10-CM | POA: Diagnosis not present

## 2020-07-28 DIAGNOSIS — K635 Polyp of colon: Secondary | ICD-10-CM | POA: Diagnosis not present

## 2020-07-28 LAB — HM COLONOSCOPY

## 2020-07-28 NOTE — Progress Notes (Signed)
DUE TO COVID-19 ONLY ONE VISITOR IS ALLOWED TO COME WITH YOU AND STAY IN THE WAITING ROOM ONLY DURING PRE OP AND PROCEDURE DAY OF SURGERY. THE 1 VISITOR  MAY VISIT WITH YOU AFTER SURGERY IN YOUR PRIVATE ROOM DURING VISITING HOURS ONLY!  YOU NEED TO HAVE A COVID 19 TEST ON___4/14/2019 ____ @_______ , THIS TEST MUST BE DONE BEFORE SURGERY,  COVID TESTING SITE [address] [place] [address], IT IS ON THE RIGHT GOING OUT [address] APPROXIMATELY  2 MINUTES PAST [employer] ON THE RIGHT. ONCE YOUR COVID TEST IS COMPLETED,  PLEASE BEGIN THE QUARANTINE INSTRUCTIONS AS OUTLINED IN YOUR HANDOUT.                [name]  07/28/2020   Your procedure is scheduled on:              08/10/2020   Report to [facility] Main  Entrance   Report to admitting at     [place] AM     Call this number if you have problems the morning of surgery [phone]    REMEMBER: NO  SOLID FOOD CANDY OR GUM AFTER MIDNIGHT. CLEAR LIQUIDS UNTIL  0415am PT CLEAR LIQUIDS UNTIL  0415am   . PLEASE FINISH ENSURE DRINK PER SURGEON ORDER  WHICH NEEDS TO BE COMPLETED AT  0415am  .      CLEAR LIQUID DIET   Foods Allowed                                                                    Coffee and tea, regular and decaf                            Fruit ices (not with fruit pulp)                                      Iced Popsicles                                    Carbonated beverages, regular and diet                                    Cranberry, grape and apple juices Sports drinks like Gatorade Lightly seasoned clear broth or consume(fat free) Sugar, honey syrup ___________________________________________________________________      BRUSH YOUR TEETH MORNING OF SURGERY AND RINSE YOUR MOUTH OUT, NO CHEWING GUM CANDY OR MINTS.     Take these medicines the morning of surgery with A SIP OF WATER:     Synthroid, ranexa , toprol, claritin, gabapentin, buspar, inhalers as usual and bring,  armour othyroid  DO NOT TAKE ANY DIABETIC MEDICATIONS DAY OF YOUR SURGERY                               You may not have any metal on your body including hair pins and  piercings  Do not wear jewelry, make-up, lotions, powders or perfumes, deodorant             Do not wear nail polish on your fingernails.  Do not shave  48 hours prior to surgery.              Men may shave face and neck.   Do not bring valuables to the hospital. [place].  Contacts, dentures or bridgework may not be worn into surgery.  Leave suitcase in the car. After surgery it may be brought to your room.     Patients discharged the day of surgery will not be allowed to drive home. IF YOU ARE HAVING SURGERY AND GOING HOME THE SAME DAY, YOU MUST HAVE AN ADULT TO DRIVE YOU HOME AND BE WITH YOU FOR 24 HOURS. YOU MAY GO HOME BY TAXI OR UBER OR ORTHERWISE, BUT AN ADULT MUST ACCOMPANY YOU HOME AND STAY WITH YOU FOR 24 HOURS.  Name and phone number of your driver:  Special Instructions: N/A              Please read over the following fact sheets you were given: _____________________________________________________________________  [facility] - Preparing for Surgery Before surgery, you can play an important role.  Because skin is not sterile, your skin needs to be as free of germs as possible.  You can reduce the number of germs on your skin by washing with CHG (chlorahexidine gluconate) soap before surgery.  CHG is an antiseptic cleaner which kills germs and bonds with the skin to continue killing germs even after washing. Please DO NOT use if you have an allergy to CHG or antibacterial soaps.  If your skin becomes reddened/irritated stop using the CHG and inform your nurse when you arrive at Short Stay. Do not shave (including legs and underarms) for at least 48 hours prior to the first CHG shower.  You may shave your face/neck. Please follow these instructions  carefully:  1.  Shower with CHG Soap the night before surgery and the  morning of Surgery.  2.  If you choose to wash your hair, wash your hair first as usual with your  normal  shampoo.  3.  After you shampoo, rinse your hair and body thoroughly to remove the  shampoo.                           4.  Use CHG as you would any other liquid soap.  You can apply chg directly  to the skin and wash                       Gently with a scrungie or clean washcloth.  5.  Apply the CHG Soap to your body ONLY FROM THE NECK DOWN.   Do not use on face/ open                           Wound or open sores. Avoid contact with eyes, ears mouth and genitals (private parts).                       Wash face,  Genitals (private parts) with your normal soap.             6.  Wash thoroughly, paying special attention to the area where your surgery  will be performed.  7.  Thoroughly rinse your body with warm water from the neck down.  8.  DO NOT shower/wash with your normal soap after using and rinsing off  the CHG Soap.                9.  Pat yourself dry with a clean towel.            10.  Wear clean pajamas.            11.  Place clean sheets on your bed the night of your first shower and do not  sleep with pets. Day of Surgery : Do not apply any lotions/deodorants the morning of surgery.  Please wear clean clothes to the hospital/surgery center.  FAILURE TO FOLLOW THESE INSTRUCTIONS MAY RESULT IN THE CANCELLATION OF YOUR SURGERY PATIENT SIGNATURE_________________________________  NURSE SIGNATURE__________________________________  ________________________________________________________________________

## 2020-07-29 ENCOUNTER — Other Ambulatory Visit (HOSPITAL_COMMUNITY): Payer: Self-pay

## 2020-07-29 ENCOUNTER — Encounter: Payer: Self-pay | Admitting: Family Medicine

## 2020-07-29 NOTE — Telephone Encounter (Signed)
Paperwork printed and placed in [employer]

## 2020-08-02 ENCOUNTER — Encounter: Payer: Self-pay | Admitting: Family Medicine

## 2020-08-02 ENCOUNTER — Telehealth: Payer: Self-pay | Admitting: Internal Medicine

## 2020-08-02 NOTE — Telephone Encounter (Signed)
Genetic test results received from [place]  Patient has (+) FH, unknown risk - per MD note  Patient notified via MyChart message

## 2020-08-03 ENCOUNTER — Encounter: Payer: Self-pay | Admitting: *Deleted

## 2020-08-03 ENCOUNTER — Encounter (HOSPITAL_COMMUNITY): Payer: Self-pay

## 2020-08-03 ENCOUNTER — Other Ambulatory Visit: Payer: Self-pay

## 2020-08-03 ENCOUNTER — Telehealth (HOSPITAL_COMMUNITY): Payer: Self-pay | Admitting: Psychiatry

## 2020-08-03 ENCOUNTER — Encounter (HOSPITAL_COMMUNITY)
Admission: RE | Admit: 2020-08-03 | Discharge: 2020-08-03 | Disposition: A | Discharge status: Home / Self Care | Payer: 59 | Source: Ambulatory Visit | Attending: General Surgery | Admitting: General Surgery

## 2020-08-03 ENCOUNTER — Other Ambulatory Visit (HOSPITAL_COMMUNITY): Payer: Self-pay

## 2020-08-03 DIAGNOSIS — Z01812 Encounter for preprocedural laboratory examination: Secondary | ICD-10-CM | POA: Diagnosis not present

## 2020-08-03 HISTORY — DX: Prediabetes: R73.03

## 2020-08-03 HISTORY — DX: Fibromyalgia: M79.7

## 2020-08-03 HISTORY — DX: Dyspnea, unspecified: R06.00

## 2020-08-03 HISTORY — DX: Unspecified osteoarthritis, unspecified site: M19.90

## 2020-08-03 LAB — CBC WITH DIFFERENTIAL/PLATELET
Abs Immature Granulocytes: 0.01 10*3/uL (ref 0.00–0.07)
Basophils Absolute: 0.1 10*3/uL (ref 0.0–0.1)
Basophils Relative: 1 %
Eosinophils Absolute: 0.1 10*3/uL (ref 0.0–0.5)
Eosinophils Relative: 2 %
HCT: 44.1 % (ref 36.0–46.0)
Hemoglobin: 14.6 g/dL (ref 12.0–15.0)
Immature Granulocytes: 0 %
Lymphocytes Relative: 35 %
Lymphs Abs: 1.8 10*3/uL (ref 0.7–4.0)
MCH: 30.9 pg (ref 26.0–34.0)
MCHC: 33.1 g/dL (ref 30.0–36.0)
MCV: 93.4 fL (ref 80.0–100.0)
Monocytes Absolute: 0.6 10*3/uL (ref 0.1–1.0)
Monocytes Relative: 11 %
Neutro Abs: 2.6 10*3/uL (ref 1.7–7.7)
Neutrophils Relative %: 51 %
Platelets: 365 10*3/uL (ref 150–400)
RBC: 4.72 MIL/uL (ref 3.87–5.11)
RDW: 12.9 % (ref 11.5–15.5)
WBC: 5.1 10*3/uL (ref 4.0–10.5)
nRBC: 0 % (ref 0.0–0.2)

## 2020-08-03 LAB — COMPREHENSIVE METABOLIC PANEL
ALT: 27 U/L (ref 0–44)
AST: 21 U/L (ref 15–41)
Albumin: 4.3 g/dL (ref 3.5–5.0)
Alkaline Phosphatase: 78 U/L (ref 38–126)
Anion gap: 9 (ref 5–15)
BUN: 12 mg/dL (ref 6–20)
CO2: 25 mmol/L (ref 22–32)
Calcium: 9.7 mg/dL (ref 8.9–10.3)
Chloride: 102 mmol/L (ref 98–111)
Creatinine, Ser: 1.06 mg/dL — ABNORMAL HIGH (ref 0.44–1.00)
GFR, Estimated: >60 mL/min (ref >60)
Glucose, Bld: 92 mg/dL (ref 70–99)
Potassium: 4.1 mmol/L (ref 3.5–5.1)
Sodium: 136 mmol/L (ref 135–145)
Total Bilirubin: 0.6 mg/dL (ref 0.3–1.2)
Total Protein: 7.7 g/dL (ref 6.5–8.1)

## 2020-08-03 LAB — HEMOGLOBIN A1C
Hgb A1c MFr Bld: 5.9 % — ABNORMAL HIGH (ref 4.8–5.6)
Mean Plasma Glucose: 122.63 mg/dL

## 2020-08-03 MED ORDER — BUSPIRONE HCL 7.5 MG PO TABS
7.5000 mg | ORAL_TABLET | Freq: Three times a day (TID) | ORAL | 0 refills | Status: DC
  Filled 2020-08-03 – 2020-08-22 (×2): qty 90, 30d supply, fill #0

## 2020-08-03 NOTE — Telephone Encounter (Signed)
Pt calling.  She would like to increase her buspar.  She is currently taking 7.5 2x daily.  Please advise.    [facility] pharmacy CB# [phone]

## 2020-08-03 NOTE — Progress Notes (Addendum)
Anesthesia Review:  PCP: Penni Homans Cardiologist : Jodelle Red Tobb- 07/18/20  Chest x-ray :02/27/20  EKG : 02/27/20  07/18/20- CT cors  Echo :04/28/19  Stress test: 05/01/19  Cardiac Cath :  Activity level:  Sleep Study/ CPAP : cpap  Fasting Blood Sugar :      / Checks Blood Sugar -- times a day:   Blood Thinner/ Instructions /Last Dose: ASA / Instructions/ Last Dose :  Pre diabetes  hgba1c-08/03/20- 5.9

## 2020-08-03 NOTE — Telephone Encounter (Signed)
Patient informed. 

## 2020-08-03 NOTE — Telephone Encounter (Signed)
She called in on 3/25 and it was already changed to bid. She would like to further increase the medication.  Please advise.

## 2020-08-03 NOTE — Telephone Encounter (Signed)
Ok I have sent it as three times a day

## 2020-08-03 NOTE — Telephone Encounter (Signed)
Can take bid, call when ready for refill and let us know pharmacy

## 2020-08-04 DIAGNOSIS — M170 Bilateral primary osteoarthritis of knee: Secondary | ICD-10-CM | POA: Diagnosis not present

## 2020-08-04 DIAGNOSIS — E1169 Type 2 diabetes mellitus with other specified complication: Secondary | ICD-10-CM | POA: Diagnosis not present

## 2020-08-04 DIAGNOSIS — I10 Essential (primary) hypertension: Secondary | ICD-10-CM | POA: Diagnosis not present

## 2020-08-04 DIAGNOSIS — E669 Obesity, unspecified: Secondary | ICD-10-CM | POA: Diagnosis not present

## 2020-08-04 DIAGNOSIS — K432 Incisional hernia without obstruction or gangrene: Secondary | ICD-10-CM | POA: Diagnosis not present

## 2020-08-04 DIAGNOSIS — G4733 Obstructive sleep apnea (adult) (pediatric): Secondary | ICD-10-CM | POA: Diagnosis not present

## 2020-08-04 DIAGNOSIS — Z9889 Other specified postprocedural states: Secondary | ICD-10-CM | POA: Diagnosis not present

## 2020-08-05 ENCOUNTER — Other Ambulatory Visit (HOSPITAL_COMMUNITY): Payer: Self-pay

## 2020-08-05 ENCOUNTER — Ambulatory Visit: Payer: Self-pay | Admitting: General Surgery

## 2020-08-05 MED FILL — Ergocalciferol Cap 1.25 MG (50000 Unit): ORAL | 84 days supply | Qty: 12 | Fill #0 | Status: AC

## 2020-08-05 MED FILL — Ranolazine Tab ER 12HR 500 MG: ORAL | 30 days supply | Qty: 60 | Fill #0 | Status: AC

## 2020-08-05 NOTE — H&P (View-Only) (Signed)
[name] Appointment: 08/04/2020 11:30 AM Location: [facility] Patient #: [id number] DOB: Jan 09, 1961 Single / Language: Cleophus Molt / Race: Black or African American Female  History of Present Illness Randall Hiss M. Kaylah Chiasson MD; 08/05/2020 5:28 PM) The patient is a 60 year old female who presents with an incisional hernia. She comes in for long-term follow-up regarding her obesity and incisional hernia. She has completed the bariatric surgery evaluation process. She was evaluated by cardiology. She had a low risk Myoview score scan. Her calcium coronary score was 0. She is followed by the lipid clinic because of her hyperlipidemia. Her upper GI showed nonspecific esophageal motility but no hiatal hernia. Chest x-ray okay. She received psychological and nutritional clearance. She did have an ultrasound of her mid abdomen looking at her hernia of which was shown to just have a fat-containing umbilical hernia. She denies any medical changes since she was last seen. She underwent routine colonoscopy last week and was told she had a benign polyp. She has a 5 year recall. She states that in the lipid clinic she was found to have genetic issues leading to her hypercholesterolemia. She uses CPAP. No chest pain, chest pressure, shortness of breath, dyspnea on exertion. No blood thinners. Plan is for Roux-en-Y gastric bypass  01/2020 She comes in today to discuss bariatric surgery. I initially met her about a year ago to discuss her incisional hernia around her umbilicus. She is now interested in bariatric surgery. She completed our Neurosurgeon. She is specifically interested in Roux-en-Y gastric bypass. She believes this is the best procedure given the amount of weight she would like to lose. Despite numerous attempts for sustained weight loss she's been unsuccessful. She has tried Atkins, [employer], low-calorie diets, Adipex, and inpatient weight program-all without any long-term  success.  Her comorbidities include hypertension, dyslipidemia, obstructive sleep apnea on CPAP, osteoarthritis of bilateral knees, carpal tunnel, diabetes mellitus  She denies any chest pain, chest pressure, shortness of breath. She does endorse some dyspnea on exertion. She was on medications for dyslipidemia but stopped due to elevated CK levels. She denies any TIAs or amaurosis fugax. She denies any history of blood clots. She denies any peripheral edema. She states that she has an upcoming cardiology appointment next month with Dr. Debara Pickett. She uses CPAP. She has rare reflux. She has constipation issues and takes linzess. She has had a hysterectomy. Her that have colon cancer. She gets a colonoscopy every 5 years. Her gastroenterologist is Dr. Cristina Gong. She had an upper endoscopy in January of this past year which was within normal limits. She denies any dysuria or hematuria. She has lower back pain in the lumbar and sacral region. She has bilateral knee pain. She also has some right upper extremity discomfort. She has carpal tunnel symptoms. She is a diabetic. Her last A1c was 6.5 in July of this year. She is hypothyroidism. I reviewed a endocrinologists note from October 6. Her TSH in July was normal. She is on gabapentin for muscle aches.  She had a CT of the chest which showed some perhaps underlying COPD and some hepatic steatosis. She had a CT cardiac scoring in March of this year which I reviewed and her calcium score was 0. She has had a previous upper endoscopy and manometry in 2015 which were normal.  She denies any tobacco, alcohol or drugs. 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 She reports that she has had a bulge there for several years. She reports some recent  discomfort around the area. She states that she had some discomfort around the umbilicus that radiated to her right flank. She also reports discomfort around the umbilicus if she leans up against a counter. She denies any nausea or vomiting. She denies any diarrhea. She does have constipation. She is on prescription linzess. She doesn't take it daily because it'll cause diarrhea. She reports numerous laparoscopic procedures mainly for pelvic pain and intermittent atrial issues and fibroid issues. She has also had a laparoscopic cholecystectomy. She has had a hysterectomy followed by hysterectomy. She has hypertension, diabetes and obstructive sleep apnea on CPAP. She quit smoking in 2005. She denies any chest pain or chest pressure or chest tightness. She is having some right shoulder and upper extremity issues. She is concerned about potential scar tissue in her abdomen since she was told every time that she had pelvic surgery some of her tubes and ovaries were adhered to her colon or intestines   Problem List/Past Medical Randall Hiss M. [name], MD; 08/05/2020 5:32 PM) LUMBOSACRAL PAIN, CHRONIC (M54.50) BILATERAL PRIMARY OSTEOARTHRITIS OF KNEE (M17.0) INCISIONAL HERNIA, WITHOUT OBSTRUCTION OR GANGRENE (K43.2) HISTORY OF ABDOMINAL SURGERY (Z98.890) SEVERE OBESITY (E66.01) I think the patient meets bariatric surgery criteria. I think she would be a good candidate. She seems motivated. She is very interested in Roux-en-Y gastric bypass. I think that is technically doable even though she has a small periumbilical hernia. We discussed that definitive hernia surgery would be followed in after weight loss after Roux-en-Y gastric bypass and that we may have to do a temporary repair during her Roux-en-Y.  This patient encounter took 22 minutes on day of visit to perform the following: take history, perform exam, review outside records, interpret imaging, counsel the patient on their diagnosis and  discussed risks and benefits of surgery including lifestyle changes needed  Past Surgical History Randall Hiss M. [name], MD; 08/05/2020 5:32 PM) Breast Biopsy Right. Colon Polyp Removal - Colonoscopy Gallbladder Surgery - Laparoscopic Hysterectomy (not due to cancer) - Complete Shoulder Surgery Left. Spinal Surgery - Lower Back  Diagnostic Studies History Randall Hiss M. [name], MD; 08/05/2020 5:32 PM) Colonoscopy 1-5 years ago Mammogram within last year Pap Smear 1-5 years ago  Allergies Janeann Forehand, CNA; 08/04/2020 11:18 AM) Erythromycin Derivatives Losartan Potassium *ANTIHYPERTENSIVES* Atorvastatin Calcium *CHEMICALS* BuPROPion & Diet Manage Prod *ANTIDEPRESSANTS* Penicillins Codeine/Codeine Derivatives Erythromycin Base *MACROLIDES* Erythromycin Estolate *CHEMICALS* Aspirin *ANALGESICS - NonNarcotic* Allergies Reconciled  Medication History Janeann Forehand, CNA; 08/04/2020 11:18 AM) Gabapentin (100MG Tablet, Oral) Active. DULoxetine HCl (30MG Capsule DR Part, Oral) Active. Vitamin D (Ergocalciferol) (Oral) Specific strength unknown - Active. Lasix (20MG Tablet, Oral) Active. Spironolactone (50MG Tablet, Oral) Active. Linzess (290MCG Capsule, Oral) Active. metFORMIN HCl (500MG Tablet, Oral) Active. Aspirin (81MG Tablet, Oral) Active. Cytomel (5MCG Tablet, Oral) Active. Gabapentin (300MG Capsule, Oral) Active. Modafinil (200MG Tablet, Oral) Active. Synthroid (100MCG Tablet, Oral) Active. Hyoscyamine Sulfate (0.125MG Tab Sublingual, Sublingual) Active. Meloxicam (15MG Tablet, Oral) Active. metFORMIN HCl (1000MG Tablet, Oral) Active. Venlafaxine HCl ER (75MG Capsule ER 24HR, Oral) Active. Metoprolol Succinate ER (50MG Tablet ER 24HR, Oral) Active. Medications Reconciled  Social History Randall Hiss M. [name], MD; 08/05/2020 5:32 PM) Alcohol use Occasional alcohol use. Caffeine use Carbonated beverages, Tea. No drug use Tobacco use Former  smoker.  Family History Randall Hiss M. [name], MD; 08/05/2020 5:32  PM) Alcohol Abuse Father. Arthritis Father, Mother. Colon Cancer Father. Colon Polyps Father. Diabetes Mellitus Brother, Sister. Heart Disease Father, Mother. Heart disease in female family member before age 77 Heart disease in female family member before age 52 Hypertension Brother, Father. Rectal Cancer Father.  Pregnancy / Birth History Randall Hiss M. [name], MD; 08/05/2020 5:32 PM) Age at menarche 67 years. Age of menopause 65-50 Gravida 0  Other Problems Randall Hiss M. [name], MD; 08/05/2020 5:32 PM) Anxiety Disorder Back Pain Cholelithiasis Gastroesophageal Reflux Disease Hypercholesterolemia Lump In Breast Oophorectomy Bilateral. OBSTRUCTIVE SLEEP APNEA (G47.33) DIABETES MELLITUS TYPE 2 IN OBESE (E11.69) HYPERTENSION, ESSENTIAL (I10)     Review of Systems Randall Hiss M. Hilery Wintle MD; 08/05/2020 5:28 PM) All other systems negative  Vitals (Donyelle Alston CNA; 08/04/2020 11:19 AM) 08/04/2020 11:18 AM Weight: 232.38 lb Height: 64in Body Surface Area: 2.08 m Body Mass Index: 39.89 kg/m  Temp.: 97.60F  Pulse: 87 (Regular)  P.OX: 99% (Room air) BP: 140/86(Sitting, Left Arm, Standard)        Physical Exam Randall Hiss M. Chelsie Burel MD; 08/05/2020 5:28 PM)  General Mental Status-Alert. General Appearance-Consistent with stated age. Hydration-Well hydrated. Voice-Normal.  Head and Neck Head-normocephalic, atraumatic with no lesions or palpable masses. Trachea-midline. Thyroid Gland Characteristics - normal size and consistency.  Eye Eyeball - Bilateral-Normal. Sclera/Conjunctiva - Bilateral-No scleral icterus.  ENMT Ears Pinna - Bilateral - no bony growth in lateral aspect of ear canal, no edema. Nose and Sinuses External Inspection of the Nose - symmetric, no deformities observed.  Chest and Lung Exam Chest and lung exam reveals -quiet, even and easy respiratory effort  with no use of accessory muscles and on auscultation, normal breath sounds, no adventitious sounds and normal vocal resonance. Inspection Chest Wall - Normal. Back - normal.  Breast - Did not examine.  Cardiovascular Cardiovascular examination reveals -normal heart sounds, regular rate and rhythm with no murmurs and normal pedal pulses bilaterally.  Abdomen Inspection  Inspection of the abdomen reveals: Note: She has a small upper midline diastases she has a palpable bulge at the umbilicus. It is reducible. Fascial defect is approximately 2 cm x 2 cm. Skin - Scar - Note: well healed trocar scars. Palpation/Percussion Palpation and Percussion of the abdomen reveal - Soft, Non Tender, No Rebound tenderness, No Rigidity (guarding) and No hepatosplenomegaly. Auscultation Auscultation of the abdomen reveals - Bowel sounds normal.  Peripheral Vascular Upper Extremity Palpation - Pulses bilaterally normal.  Neurologic Neurologic evaluation reveals -alert and oriented x 3 with no impairment of recent or remote memory. Mental Status-Normal.  Neuropsychiatric The patient's mood and affect are described as -normal. Judgment and Insight-insight is appropriate concerning matters relevant to self.  Musculoskeletal Normal Exam - Left-Upper Extremity Strength Normal and Lower Extremity Strength Normal. Normal Exam - Right-Upper Extremity Strength Normal and Lower Extremity Strength Normal.  Lymphatic Head & Neck  General Head & Neck Lymphatics: Bilateral - Description - Normal. Axillary - Did not examine. Femoral & Inguinal - Did not examine.    Assessment & Plan Randall Hiss M. Kaedin Hicklin MD; 08/05/2020 5:32 PM)  HISTORY OF ABDOMINAL SURGERY [phone]) Impression: Reviewed her operative note from 2005 where her laparoscopy was converted to a Pfannenstiel incision due to her ovary being stuck to her colon. There was no mention of small bowel adhesions.   SEVERE OBESITY  (E66.01) Story: I think the patient meets bariatric surgery criteria. I think she would be a good candidate. She seems motivated. She is very interested in Roux-en-Y gastric bypass. I think that is technically  doable even though she has a small periumbilical hernia. We discussed that definitive hernia surgery would be followed in after weight loss after Roux-en-Y gastric bypass and that we may have to do a temporary repair during her Roux-en-Y.  This patient encounter took 22 minutes on day of visit to perform the following: take history, perform exam, review outside records, interpret imaging, counsel the patient on their diagnosis and discussed risks and benefits of surgery including lifestyle changes needed Impression: The patient meets weight loss surgery criteria. I think the patient would be an acceptable candidate for Laparoscopic Roux-en-Y Gastric bypass.  We reviewed her workup. We discussed her radiological evaluation and cardiology evaluation. She has attended her preoperative education class. I discussed the typical hospitalization. We will have to reduce her Akin containing umbilical hernia and do it temporary hernia repair if the defect is small. The defect is large be probably lead to defect alone because of the low likelihood of the incarceration. We did discuss the typical things that we see after surgery in the first few weeks. I offered to rediscussed the actual steps of the procedure along with the actual risk and benefits but she declined. We have discussed that extensively at her prior appointment. We discussed the importance the preoperative meal plan.  Current Plans Pt Education - EMW_preopbariatric  BILATERAL PRIMARY OSTEOARTHRITIS OF KNEE (M17.0) Impression: confirmed on xray 3/21   INCISIONAL HERNIA, WITHOUT OBSTRUCTION OR GANGRENE (K43.2) Impression: I advised her that we would generally try to avoid dealing with her small periumbilical incisional hernia during her  Roux-en-Y gastric bypass. However if there is any scar tissue we would have to take it down in order to expose the small bowel. If the defect is small but probably need to do a primary repair. We would not put in mesh due to the fact that we were stapling through intestine and stomach for the bypass portion and would have an increased risk of mesh infection of permanent mesh.   HYPERTENSION, ESSENTIAL (I10) Impression: low risk by cards   OBSTRUCTIVE SLEEP APNEA (G47.33) Impression: on cpap   DIABETES MELLITUS TYPE 2 IN OBESE (E11.69) Impression: controlled. 6.27 October 2019  [name]. [name], MD, FACS General, Bariatric, & Minimally Invasive Surgery [facility], [place]

## 2020-08-05 NOTE — H&P (Signed)
[name] Appointment: 08/04/2020 11:30 AM Location: [facility] Patient #: [id number] DOB: 08/24/1960 Single / Language: [name] / Race: Black or African American Female  History of Present Illness Randall Hiss M. Jeydan Barner MD; 08/05/2020 5:28 PM) The patient is a 60 year old female who presents with an incisional hernia. She comes in for long-term follow-up regarding her obesity and incisional hernia. She has completed the bariatric surgery evaluation process. She was evaluated by cardiology. She had a low risk Myoview score scan. Her calcium coronary score was 0. She is followed by the lipid clinic because of her hyperlipidemia. Her upper GI showed nonspecific esophageal motility but no hiatal hernia. Chest x-ray okay. She received psychological and nutritional clearance. She did have an ultrasound of her mid abdomen looking at her hernia of which was shown to just have a fat-containing umbilical hernia. She denies any medical changes since she was last seen. She underwent routine colonoscopy last week and was told she had a benign polyp. She has a 5 year recall. She states that in the lipid clinic she was found to have genetic issues leading to her hypercholesterolemia. She uses CPAP. No chest pain, chest pressure, shortness of breath, dyspnea on exertion. No blood thinners. Plan is for Roux-en-Y gastric bypass  01/2020 She comes in today to discuss bariatric surgery. I initially met her about a year ago to discuss her incisional hernia around her umbilicus. She is now interested in bariatric surgery. She completed our Neurosurgeon. She is specifically interested in Roux-en-Y gastric bypass. She believes this is the best procedure given the amount of weight she would like to lose. Despite numerous attempts for sustained weight loss she's been unsuccessful. She has tried Atkins, [employer], low-calorie diets, Adipex, and inpatient weight program-all without any long-term  success.  Her comorbidities include hypertension, dyslipidemia, obstructive sleep apnea on CPAP, osteoarthritis of bilateral knees, carpal tunnel, diabetes mellitus  She denies any chest pain, chest pressure, shortness of breath. She does endorse some dyspnea on exertion. She was on medications for dyslipidemia but stopped due to elevated CK levels. She denies any TIAs or amaurosis fugax. She denies any history of blood clots. She denies any peripheral edema. She states that she has an upcoming cardiology appointment next month with Dr. Debara Pickett. She uses CPAP. She has rare reflux. She has constipation issues and takes linzess. She has had a hysterectomy. Her that have colon cancer. She gets a colonoscopy every 5 years. Her gastroenterologist is Dr. Cristina Gong. She had an upper endoscopy in January of this past year which was within normal limits. She denies any dysuria or hematuria. She has lower back pain in the lumbar and sacral region. She has bilateral knee pain. She also has some right upper extremity discomfort. She has carpal tunnel symptoms. She is a diabetic. Her last A1c was 6.5 in July of this year. She is hypothyroidism. I reviewed a endocrinologists note from October 6. Her TSH in July was normal. She is on gabapentin for muscle aches.  She had a CT of the chest which showed some perhaps underlying COPD and some hepatic steatosis. She had a CT cardiac scoring in March of this year which I reviewed and her calcium score was 0. She has had a previous upper endoscopy and manometry in 2015 which were normal.  She denies any tobacco, alcohol or drugs. 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 She reports that she has had a bulge there for several years. She reports some recent  discomfort around the area. She states that she had some discomfort around the umbilicus that radiated to her right flank. She also reports discomfort around the umbilicus if she leans up against a counter. She denies any nausea or vomiting. She denies any diarrhea. She does have constipation. She is on prescription linzess. She doesn't take it daily because it'll cause diarrhea. She reports numerous laparoscopic procedures mainly for pelvic pain and intermittent atrial issues and fibroid issues. She has also had a laparoscopic cholecystectomy. She has had a hysterectomy followed by hysterectomy. She has hypertension, diabetes and obstructive sleep apnea on CPAP. She quit smoking in 2005. She denies any chest pain or chest pressure or chest tightness. She is having some right shoulder and upper extremity issues. She is concerned about potential scar tissue in her abdomen since she was told every time that she had pelvic surgery some of her tubes and ovaries were adhered to her colon or intestines   Problem List/Past Medical Randall Hiss M. [name], MD; 08/05/2020 5:32 PM) LUMBOSACRAL PAIN, CHRONIC (M54.50) BILATERAL PRIMARY OSTEOARTHRITIS OF KNEE (M17.0) INCISIONAL HERNIA, WITHOUT OBSTRUCTION OR GANGRENE (K43.2) HISTORY OF ABDOMINAL SURGERY (Z98.890) SEVERE OBESITY (E66.01) I think the patient meets bariatric surgery criteria. I think she would be a good candidate. She seems motivated. She is very interested in Roux-en-Y gastric bypass. I think that is technically doable even though she has a small periumbilical hernia. We discussed that definitive hernia surgery would be followed in after weight loss after Roux-en-Y gastric bypass and that we may have to do a temporary repair during her Roux-en-Y.  This patient encounter took 22 minutes on day of visit to perform the following: take history, perform exam, review outside records, interpret imaging, counsel the patient on their diagnosis and  discussed risks and benefits of surgery including lifestyle changes needed  Past Surgical History Randall Hiss M. [name], MD; 08/05/2020 5:32 PM) Breast Biopsy Right. Colon Polyp Removal - Colonoscopy Gallbladder Surgery - Laparoscopic Hysterectomy (not due to cancer) - Complete Shoulder Surgery Left. Spinal Surgery - Lower Back  Diagnostic Studies History Randall Hiss M. [name], MD; 08/05/2020 5:32 PM) Colonoscopy 1-5 years ago Mammogram within last year Pap Smear 1-5 years ago  Allergies Janeann Forehand, CNA; 08/04/2020 11:18 AM) Erythromycin Derivatives Losartan Potassium *ANTIHYPERTENSIVES* Atorvastatin Calcium *CHEMICALS* BuPROPion & Diet Manage Prod *ANTIDEPRESSANTS* Penicillins Codeine/Codeine Derivatives Erythromycin Base *MACROLIDES* Erythromycin Estolate *CHEMICALS* Aspirin *ANALGESICS - NonNarcotic* Allergies Reconciled  Medication History Janeann Forehand, CNA; 08/04/2020 11:18 AM) Gabapentin (100MG Tablet, Oral) Active. DULoxetine HCl (30MG Capsule DR Part, Oral) Active. Vitamin D (Ergocalciferol) (Oral) Specific strength unknown - Active. Lasix (20MG Tablet, Oral) Active. Spironolactone (50MG Tablet, Oral) Active. Linzess (290MCG Capsule, Oral) Active. metFORMIN HCl (500MG Tablet, Oral) Active. Aspirin (81MG Tablet, Oral) Active. Cytomel (5MCG Tablet, Oral) Active. Gabapentin (300MG Capsule, Oral) Active. Modafinil (200MG Tablet, Oral) Active. Synthroid (100MCG Tablet, Oral) Active. Hyoscyamine Sulfate (0.125MG Tab Sublingual, Sublingual) Active. Meloxicam (15MG Tablet, Oral) Active. metFORMIN HCl (1000MG Tablet, Oral) Active. Venlafaxine HCl ER (75MG Capsule ER 24HR, Oral) Active. Metoprolol Succinate ER (50MG Tablet ER 24HR, Oral) Active. Medications Reconciled  Social History Randall Hiss M. [name], MD; 08/05/2020 5:32 PM) Alcohol use Occasional alcohol use. Caffeine use Carbonated beverages, Tea. No drug use Tobacco use Former  smoker.  Family History Randall Hiss M. [name], MD; 08/05/2020 5:32  PM) Alcohol Abuse Father. Arthritis Father, Mother. Colon Cancer Father. Colon Polyps Father. Diabetes Mellitus Brother, Sister. Heart Disease Father, Mother. Heart disease in female family member before age 70 Heart disease in female family member before age 56 Hypertension Brother, Father. Rectal Cancer Father.  Pregnancy / Birth History Randall Hiss M. [name], MD; 08/05/2020 5:32 PM) Age at menarche 34 years. Age of menopause 51-50 Gravida 0  Other Problems Randall Hiss M. [name], MD; 08/05/2020 5:32 PM) Anxiety Disorder Back Pain Cholelithiasis Gastroesophageal Reflux Disease Hypercholesterolemia Lump In Breast Oophorectomy Bilateral. OBSTRUCTIVE SLEEP APNEA (G47.33) DIABETES MELLITUS TYPE 2 IN OBESE (E11.69) HYPERTENSION, ESSENTIAL (I10)     Review of Systems Randall Hiss M. Miryah Ralls MD; 08/05/2020 5:28 PM) All other systems negative  Vitals (Donyelle Alston CNA; 08/04/2020 11:19 AM) 08/04/2020 11:18 AM Weight: 232.38 lb Height: 64in Body Surface Area: 2.08 m Body Mass Index: 39.89 kg/m  Temp.: 97.28F  Pulse: 87 (Regular)  P.OX: 99% (Room air) BP: 140/86(Sitting, Left Arm, Standard)        Physical Exam Randall Hiss M. Sheppard Luckenbach MD; 08/05/2020 5:28 PM)  General Mental Status-Alert. General Appearance-Consistent with stated age. Hydration-Well hydrated. Voice-Normal.  Head and Neck Head-normocephalic, atraumatic with no lesions or palpable masses. Trachea-midline. Thyroid Gland Characteristics - normal size and consistency.  Eye Eyeball - Bilateral-Normal. Sclera/Conjunctiva - Bilateral-No scleral icterus.  ENMT Ears Pinna - Bilateral - no bony growth in lateral aspect of ear canal, no edema. Nose and Sinuses External Inspection of the Nose - symmetric, no deformities observed.  Chest and Lung Exam Chest and lung exam reveals -quiet, even and easy respiratory effort  with no use of accessory muscles and on auscultation, normal breath sounds, no adventitious sounds and normal vocal resonance. Inspection Chest Wall - Normal. Back - normal.  Breast - Did not examine.  Cardiovascular Cardiovascular examination reveals -normal heart sounds, regular rate and rhythm with no murmurs and normal pedal pulses bilaterally.  Abdomen Inspection  Inspection of the abdomen reveals: Note: She has a small upper midline diastases she has a palpable bulge at the umbilicus. It is reducible. Fascial defect is approximately 2 cm x 2 cm. Skin - Scar - Note: well healed trocar scars. Palpation/Percussion Palpation and Percussion of the abdomen reveal - Soft, Non Tender, No Rebound tenderness, No Rigidity (guarding) and No hepatosplenomegaly. Auscultation Auscultation of the abdomen reveals - Bowel sounds normal.  Peripheral Vascular Upper Extremity Palpation - Pulses bilaterally normal.  Neurologic Neurologic evaluation reveals -alert and oriented x 3 with no impairment of recent or remote memory. Mental Status-Normal.  Neuropsychiatric The patient's mood and affect are described as -normal. Judgment and Insight-insight is appropriate concerning matters relevant to self.  Musculoskeletal Normal Exam - Left-Upper Extremity Strength Normal and Lower Extremity Strength Normal. Normal Exam - Right-Upper Extremity Strength Normal and Lower Extremity Strength Normal.  Lymphatic Head & Neck  General Head & Neck Lymphatics: Bilateral - Description - Normal. Axillary - Did not examine. Femoral & Inguinal - Did not examine.    Assessment & Plan Randall Hiss M. Maverick Dieudonne MD; 08/05/2020 5:32 PM)  HISTORY OF ABDOMINAL SURGERY [phone]) Impression: Reviewed her operative note from 2005 where her laparoscopy was converted to a Pfannenstiel incision due to her ovary being stuck to her colon. There was no mention of small bowel adhesions.   SEVERE OBESITY  (E66.01) Story: I think the patient meets bariatric surgery criteria. I think she would be a good candidate. She seems motivated. She is very interested in Roux-en-Y gastric bypass. I think that is technically  doable even though she has a small periumbilical hernia. We discussed that definitive hernia surgery would be followed in after weight loss after Roux-en-Y gastric bypass and that we may have to do a temporary repair during her Roux-en-Y.  This patient encounter took 22 minutes on day of visit to perform the following: take history, perform exam, review outside records, interpret imaging, counsel the patient on their diagnosis and discussed risks and benefits of surgery including lifestyle changes needed Impression: The patient meets weight loss surgery criteria. I think the patient would be an acceptable candidate for Laparoscopic Roux-en-Y Gastric bypass.  We reviewed her workup. We discussed her radiological evaluation and cardiology evaluation. She has attended her preoperative education class. I discussed the typical hospitalization. We will have to reduce her Akin containing umbilical hernia and do it temporary hernia repair if the defect is small. The defect is large be probably lead to defect alone because of the low likelihood of the incarceration. We did discuss the typical things that we see after surgery in the first few weeks. I offered to rediscussed the actual steps of the procedure along with the actual risk and benefits but she declined. We have discussed that extensively at her prior appointment. We discussed the importance the preoperative meal plan.  Current Plans Pt Education - EMW_preopbariatric  BILATERAL PRIMARY OSTEOARTHRITIS OF KNEE (M17.0) Impression: confirmed on xray 3/21   INCISIONAL HERNIA, WITHOUT OBSTRUCTION OR GANGRENE (K43.2) Impression: I advised her that we would generally try to avoid dealing with her small periumbilical incisional hernia during her  Roux-en-Y gastric bypass. However if there is any scar tissue we would have to take it down in order to expose the small bowel. If the defect is small but probably need to do a primary repair. We would not put in mesh due to the fact that we were stapling through intestine and stomach for the bypass portion and would have an increased risk of mesh infection of permanent mesh.   HYPERTENSION, ESSENTIAL (I10) Impression: low risk by cards   OBSTRUCTIVE SLEEP APNEA (G47.33) Impression: on cpap   DIABETES MELLITUS TYPE 2 IN OBESE (E11.69) Impression: controlled. 6.27 October 2019  [name]. [name], MD, FACS General, Bariatric, & Minimally Invasive Surgery [facility], [place]

## 2020-08-06 ENCOUNTER — Other Ambulatory Visit (HOSPITAL_COMMUNITY)
Admission: RE | Admit: 2020-08-06 | Discharge: 2020-08-06 | Disposition: A | Discharge status: Home / Self Care | Payer: 59 | Source: Ambulatory Visit | Attending: General Surgery | Admitting: General Surgery

## 2020-08-06 DIAGNOSIS — Z01812 Encounter for preprocedural laboratory examination: Secondary | ICD-10-CM | POA: Diagnosis not present

## 2020-08-06 DIAGNOSIS — Z20822 Contact with and (suspected) exposure to covid-19: Secondary | ICD-10-CM | POA: Diagnosis not present

## 2020-08-06 LAB — SARS CORONAVIRUS 2 (TAT 6-24 HRS): SARS Coronavirus 2: NEGATIVE

## 2020-08-09 MED ORDER — BUPIVACAINE LIPOSOME 1.3 % IJ SUSP
20.0000 mL | Freq: Once | INTRAMUSCULAR | Status: DC
  Administered 2020-08-10: 266 mg
  Filled 2020-08-09: qty 20

## 2020-08-09 MED ORDER — GENTAMICIN SULFATE 40 MG/ML IJ SOLN
1.5000 mg/kg | INTRAVENOUS | Status: AC
  Administered 2020-08-10: 114.8 mg via INTRAVENOUS
  Filled 2020-08-09: qty 2.75

## 2020-08-09 NOTE — Anesthesia Preprocedure Evaluation (Addendum)
Anesthesia Evaluation  Patient identified by MRN, date of birth, ID band Patient awake    Reviewed: Allergy & Precautions, NPO status , Patient's Chart, lab work & pertinent test results  History of Anesthesia Complications Negative for: history of anesthetic complications  Airway Mallampati: II  TM Distance: >3 FB Neck ROM: Full    Dental  (+) Loose, Dental Advisory Given,    Pulmonary sleep apnea , COPD, former smoker,    Pulmonary exam normal        Cardiovascular hypertension, + CAD  Normal cardiovascular exam     Neuro/Psych Anxiety Depression negative neurological ROS     GI/Hepatic Neg liver ROS, GERD  ,  Endo/Other  diabetes, Type 2Hypothyroidism Morbid obesity  Renal/GU negative Renal ROS  negative genitourinary   Musculoskeletal  (+) Fibromyalgia -  Abdominal   Peds  Hematology negative hematology ROS (+)   Anesthesia Other Findings  Coronary CT 05/04/20:  IMPRESSION: 1. Coronary calcium score of 0. This was 1st percentile for age, sex, and race matched control. 2. Normal coronary origin. Right dominance. 3. RCA is a large dominant artery that gives rise to PDA and PLA. There is a mild (25-49%) non-obstructive soft plaque in the proximal vessel, there is a mild (25-49%) non-obstructive soft plaque distal to a step artifact in the mid vessel. 4. CAD-RADS 2. Mild non-obstructive CAD (25-49%). Consider non-atherosclerotic causes of chest pain. Consider preventive therapy and risk factor modification. 5. Atypical pulmonary vein drainage (common left pulmonary vein) into the left atrium.  Echo 04/28/19: EF 60-65%, mild LVH, normal RV function, valves normal  Stress test 05/01/19:   Nuclear stress EF: 61%. No wall motion abnormalities  There was no ST segment deviation noted during stress.  This is a low risk study. No ischemia identified    Reproductive/Obstetrics                             Anesthesia Physical Anesthesia Plan  ASA: III  Anesthesia Plan: General   Post-op Pain Management:    Induction: Intravenous  PONV Risk Score and Plan: 4 or greater and Ondansetron, Dexamethasone, Treatment may vary due to age or medical condition, Midazolam and Scopolamine patch - Pre-op  Airway Management Planned: Oral ETT  Additional Equipment: None  Intra-op Plan:   Post-operative Plan: Extubation in OR  Informed Consent: I have reviewed the patients History and Physical, chart, labs and discussed the procedure including the risks, benefits and alternatives for the proposed anesthesia with the patient or authorized representative who has indicated his/her understanding and acceptance.     Dental advisory given  Plan Discussed with:   Anesthesia Plan Comments:        Anesthesia Quick Evaluation

## 2020-08-10 ENCOUNTER — Inpatient Hospital Stay (HOSPITAL_COMMUNITY): Payer: 59 | Admitting: Physician Assistant

## 2020-08-10 ENCOUNTER — Inpatient Hospital Stay (HOSPITAL_COMMUNITY): Payer: 59 | Admitting: Anesthesiology

## 2020-08-10 ENCOUNTER — Inpatient Hospital Stay (HOSPITAL_COMMUNITY)
Admission: RE | Admit: 2020-08-10 | Discharge: 2020-08-16 | DRG: 327 | Disposition: A | Discharge status: Home / Self Care | Payer: 59 | Attending: General Surgery | Admitting: General Surgery

## 2020-08-10 ENCOUNTER — Encounter (HOSPITAL_COMMUNITY)
Admission: RE | Disposition: A | Discharge status: Home / Self Care | Payer: Self-pay | Source: Home / Self Care | Attending: General Surgery

## 2020-08-10 ENCOUNTER — Ambulatory Visit: Payer: 59 | Admitting: Cardiology

## 2020-08-10 ENCOUNTER — Other Ambulatory Visit: Payer: Self-pay

## 2020-08-10 ENCOUNTER — Encounter (HOSPITAL_COMMUNITY): Payer: Self-pay | Admitting: General Surgery

## 2020-08-10 DIAGNOSIS — K5900 Constipation, unspecified: Secondary | ICD-10-CM | POA: Diagnosis present

## 2020-08-10 DIAGNOSIS — Z9049 Acquired absence of other specified parts of digestive tract: Secondary | ICD-10-CM

## 2020-08-10 DIAGNOSIS — K429 Umbilical hernia without obstruction or gangrene: Secondary | ICD-10-CM | POA: Diagnosis present

## 2020-08-10 DIAGNOSIS — Z6841 Body Mass Index (BMI) 40.0 and over, adult: Secondary | ICD-10-CM

## 2020-08-10 DIAGNOSIS — M545 Low back pain, unspecified: Secondary | ICD-10-CM | POA: Diagnosis present

## 2020-08-10 DIAGNOSIS — G894 Chronic pain syndrome: Secondary | ICD-10-CM | POA: Diagnosis present

## 2020-08-10 DIAGNOSIS — Z9884 Bariatric surgery status: Secondary | ICD-10-CM

## 2020-08-10 DIAGNOSIS — Z86010 Personal history of colonic polyps: Secondary | ICD-10-CM | POA: Diagnosis not present

## 2020-08-10 DIAGNOSIS — E7800 Pure hypercholesterolemia, unspecified: Secondary | ICD-10-CM | POA: Diagnosis present

## 2020-08-10 DIAGNOSIS — I10 Essential (primary) hypertension: Secondary | ICD-10-CM | POA: Diagnosis not present

## 2020-08-10 DIAGNOSIS — F419 Anxiety disorder, unspecified: Secondary | ICD-10-CM | POA: Diagnosis present

## 2020-08-10 DIAGNOSIS — M170 Bilateral primary osteoarthritis of knee: Secondary | ICD-10-CM | POA: Diagnosis present

## 2020-08-10 DIAGNOSIS — Z885 Allergy status to narcotic agent status: Secondary | ICD-10-CM

## 2020-08-10 DIAGNOSIS — M797 Fibromyalgia: Secondary | ICD-10-CM | POA: Diagnosis present

## 2020-08-10 DIAGNOSIS — Z8249 Family history of ischemic heart disease and other diseases of the circulatory system: Secondary | ICD-10-CM

## 2020-08-10 DIAGNOSIS — G4730 Sleep apnea, unspecified: Secondary | ICD-10-CM | POA: Diagnosis present

## 2020-08-10 DIAGNOSIS — E1169 Type 2 diabetes mellitus with other specified complication: Secondary | ICD-10-CM | POA: Diagnosis present

## 2020-08-10 DIAGNOSIS — G4710 Hypersomnia, unspecified: Secondary | ICD-10-CM | POA: Diagnosis present

## 2020-08-10 DIAGNOSIS — G5601 Carpal tunnel syndrome, right upper limb: Secondary | ICD-10-CM | POA: Diagnosis present

## 2020-08-10 DIAGNOSIS — E669 Obesity, unspecified: Secondary | ICD-10-CM | POA: Diagnosis present

## 2020-08-10 DIAGNOSIS — E119 Type 2 diabetes mellitus without complications: Secondary | ICD-10-CM | POA: Diagnosis not present

## 2020-08-10 DIAGNOSIS — K432 Incisional hernia without obstruction or gangrene: Secondary | ICD-10-CM | POA: Diagnosis not present

## 2020-08-10 DIAGNOSIS — Z880 Allergy status to penicillin: Secondary | ICD-10-CM

## 2020-08-10 DIAGNOSIS — R1110 Vomiting, unspecified: Secondary | ICD-10-CM | POA: Diagnosis not present

## 2020-08-10 DIAGNOSIS — Z7982 Long term (current) use of aspirin: Secondary | ICD-10-CM

## 2020-08-10 DIAGNOSIS — G4733 Obstructive sleep apnea (adult) (pediatric): Secondary | ICD-10-CM | POA: Diagnosis present

## 2020-08-10 DIAGNOSIS — E079 Disorder of thyroid, unspecified: Secondary | ICD-10-CM | POA: Diagnosis present

## 2020-08-10 DIAGNOSIS — Z881 Allergy status to other antibiotic agents status: Secondary | ICD-10-CM | POA: Diagnosis not present

## 2020-08-10 DIAGNOSIS — E11649 Type 2 diabetes mellitus with hypoglycemia without coma: Secondary | ICD-10-CM | POA: Diagnosis not present

## 2020-08-10 DIAGNOSIS — Z800 Family history of malignant neoplasm of digestive organs: Secondary | ICD-10-CM

## 2020-08-10 DIAGNOSIS — E66811 Obesity, class 1: Secondary | ICD-10-CM

## 2020-08-10 DIAGNOSIS — Z87891 Personal history of nicotine dependence: Secondary | ICD-10-CM

## 2020-08-10 DIAGNOSIS — Z886 Allergy status to analgesic agent status: Secondary | ICD-10-CM

## 2020-08-10 DIAGNOSIS — Z79899 Other long term (current) drug therapy: Secondary | ICD-10-CM

## 2020-08-10 DIAGNOSIS — Z833 Family history of diabetes mellitus: Secondary | ICD-10-CM

## 2020-08-10 DIAGNOSIS — E785 Hyperlipidemia, unspecified: Secondary | ICD-10-CM | POA: Diagnosis not present

## 2020-08-10 DIAGNOSIS — K219 Gastro-esophageal reflux disease without esophagitis: Secondary | ICD-10-CM | POA: Diagnosis present

## 2020-08-10 DIAGNOSIS — Z7984 Long term (current) use of oral hypoglycemic drugs: Secondary | ICD-10-CM

## 2020-08-10 DIAGNOSIS — Z79890 Hormone replacement therapy (postmenopausal): Secondary | ICD-10-CM

## 2020-08-10 DIAGNOSIS — E039 Hypothyroidism, unspecified: Secondary | ICD-10-CM | POA: Diagnosis not present

## 2020-08-10 HISTORY — PX: UPPER GI ENDOSCOPY: SHX6162

## 2020-08-10 HISTORY — PX: GASTRIC ROUX-EN-Y: SHX5262

## 2020-08-10 HISTORY — PX: INCISIONAL HERNIA REPAIR: SHX193

## 2020-08-10 LAB — TYPE AND SCREEN
ABO/RH(D): AB POS
Antibody Screen: NEGATIVE

## 2020-08-10 LAB — GLUCOSE, CAPILLARY
Glucose-Capillary: 89 mg/dL (ref 70–99)
Glucose-Capillary: 132 mg/dL — ABNORMAL HIGH (ref 70–99)
Glucose-Capillary: 122 mg/dL — ABNORMAL HIGH (ref 70–99)
Glucose-Capillary: 99 mg/dL (ref 70–99)

## 2020-08-10 LAB — HEMOGLOBIN AND HEMATOCRIT, BLOOD
HCT: 40.3 % (ref 36.0–46.0)
Hemoglobin: 13.4 g/dL (ref 12.0–15.0)

## 2020-08-10 LAB — ABO/RH: ABO/RH(D): AB POS

## 2020-08-10 SURGERY — LAPAROSCOPIC ROUX-EN-Y GASTRIC BYPASS WITH UPPER ENDOSCOPY
Anesthesia: General | Site: Esophagus

## 2020-08-10 MED ORDER — MIDAZOLAM HCL 2 MG/2ML IJ SOLN
INTRAMUSCULAR | Status: AC
  Filled 2020-08-10: qty 2

## 2020-08-10 MED ORDER — OXYCODONE HCL 5 MG PO TABS
5.0000 mg | ORAL_TABLET | Freq: Once | ORAL | Status: AC | PRN
  Administered 2020-08-10: 5 mg via ORAL

## 2020-08-10 MED ORDER — SCOPOLAMINE 1 MG/3DAYS TD PT72
1.0000 | MEDICATED_PATCH | TRANSDERMAL | Status: DC
  Administered 2020-08-10: 1.5 mg via TRANSDERMAL
  Filled 2020-08-10: qty 1

## 2020-08-10 MED ORDER — GABAPENTIN 100 MG PO CAPS
200.0000 mg | ORAL_CAPSULE | Freq: Two times a day (BID) | ORAL | Status: DC
  Administered 2020-08-10 – 2020-08-13 (×5): 200 mg via ORAL
  Filled 2020-08-10 (×5): qty 2

## 2020-08-10 MED ORDER — SODIUM CHLORIDE (PF) 0.9 % IJ SOLN
INTRAMUSCULAR | Status: DC | PRN
  Administered 2020-08-10: 50 mL

## 2020-08-10 MED ORDER — IPRATROPIUM BROMIDE 0.06 % NA SOLN
2.0000 | Freq: Every day | NASAL | Status: DC
  Administered 2020-08-11 – 2020-08-16 (×5): 2 via NASAL
  Filled 2020-08-10: qty 15

## 2020-08-10 MED ORDER — SIMETHICONE 80 MG PO CHEW
80.0000 mg | CHEWABLE_TABLET | Freq: Four times a day (QID) | ORAL | Status: DC | PRN
Start: 2020-08-10 — End: 2020-08-16
  Administered 2020-08-12: 80 mg via ORAL
  Filled 2020-08-10: qty 1

## 2020-08-10 MED ORDER — 0.9 % SODIUM CHLORIDE (POUR BTL) OPTIME
TOPICAL | Status: DC | PRN
  Administered 2020-08-10: 1000 mL

## 2020-08-10 MED ORDER — POTASSIUM CHLORIDE IN NACL 20-0.45 MEQ/L-% IV SOLN
INTRAVENOUS | Status: DC
  Filled 2020-08-10 (×5): qty 1000

## 2020-08-10 MED ORDER — AMISULPRIDE (ANTIEMETIC) 5 MG/2ML IV SOLN: 10.0000 mg | Freq: Once | INTRAVENOUS | Status: DC | PRN

## 2020-08-10 MED ORDER — ROCURONIUM BROMIDE 10 MG/ML (PF) SYRINGE
PREFILLED_SYRINGE | INTRAVENOUS | Status: AC
  Filled 2020-08-10: qty 10

## 2020-08-10 MED ORDER — LACTATED RINGERS IV SOLN: INTRAVENOUS | Status: DC

## 2020-08-10 MED ORDER — ONDANSETRON HCL 4 MG/2ML IJ SOLN
INTRAMUSCULAR | Status: AC
  Filled 2020-08-10: qty 2

## 2020-08-10 MED ORDER — OXYCODONE HCL 5 MG/5ML PO SOLN: 5.0000 mg | Freq: Once | ORAL | Status: AC | PRN

## 2020-08-10 MED ORDER — PROPOFOL 10 MG/ML IV BOLUS
INTRAVENOUS | Status: DC | PRN
  Administered 2020-08-10: 200 mg via INTRAVENOUS

## 2020-08-10 MED ORDER — ENOXAPARIN SODIUM 30 MG/0.3ML ~~LOC~~ SOLN
30.0000 mg | Freq: Two times a day (BID) | SUBCUTANEOUS | Status: DC
  Administered 2020-08-10 – 2020-08-16 (×12): 30 mg via SUBCUTANEOUS
  Filled 2020-08-10 (×12): qty 0.3

## 2020-08-10 MED ORDER — THYROID 60 MG PO TABS
90.0000 mg | ORAL_TABLET | Freq: Every day | ORAL | Status: DC
  Filled 2020-08-10: qty 1

## 2020-08-10 MED ORDER — ACETAMINOPHEN 160 MG/5ML PO SOLN
1000.0000 mg | Freq: Three times a day (TID) | ORAL | Status: DC
  Administered 2020-08-13 – 2020-08-16 (×4): 1000 mg via ORAL
  Filled 2020-08-10 (×5): qty 40.6

## 2020-08-10 MED ORDER — SODIUM CHLORIDE 0.9 % IV SOLN
12.5000 mg | Freq: Four times a day (QID) | INTRAVENOUS | Status: DC | PRN
  Filled 2020-08-10: qty 0.5

## 2020-08-10 MED ORDER — PHENYLEPHRINE 40 MCG/ML (10ML) SYRINGE FOR IV PUSH (FOR BLOOD PRESSURE SUPPORT)
PREFILLED_SYRINGE | INTRAVENOUS | Status: DC | PRN
  Administered 2020-08-10: 80 ug via INTRAVENOUS

## 2020-08-10 MED ORDER — MIDAZOLAM HCL 5 MG/5ML IJ SOLN
INTRAMUSCULAR | Status: DC | PRN
  Administered 2020-08-10: 2 mg via INTRAVENOUS

## 2020-08-10 MED ORDER — OXYCODONE HCL 5 MG PO TABS
ORAL_TABLET | ORAL | Status: AC
  Filled 2020-08-10: qty 1

## 2020-08-10 MED ORDER — PROPOFOL 10 MG/ML IV BOLUS
INTRAVENOUS | Status: AC
  Filled 2020-08-10: qty 20

## 2020-08-10 MED ORDER — THYROID 30 MG PO TABS
90.0000 mg | ORAL_TABLET | Freq: Every day | ORAL | Status: DC
  Administered 2020-08-11 – 2020-08-16 (×6): 90 mg via ORAL
  Filled 2020-08-10 (×6): qty 3

## 2020-08-10 MED ORDER — "VISTASEAL 4 ML SINGLE DOSE KIT "
4.0000 mL | PACK | Freq: Once | CUTANEOUS | Status: DC
  Filled 2020-08-10: qty 4

## 2020-08-10 MED ORDER — LIDOCAINE 2% (20 MG/ML) 5 ML SYRINGE
INTRAMUSCULAR | Status: DC | PRN
  Administered 2020-08-10: 100 mg via INTRAVENOUS

## 2020-08-10 MED ORDER — LIDOCAINE 2% (20 MG/ML) 5 ML SYRINGE
INTRAMUSCULAR | Status: DC | PRN
  Administered 2020-08-10: 1.5 mg/kg/h via INTRAVENOUS

## 2020-08-10 MED ORDER — ENSURE MAX PROTEIN PO LIQD
2.0000 [oz_av] | ORAL | Status: DC
  Administered 2020-08-11 – 2020-08-16 (×37): 2 [oz_av] via ORAL

## 2020-08-10 MED ORDER — INSULIN ASPART 100 UNIT/ML ~~LOC~~ SOLN
0.0000 [IU] | SUBCUTANEOUS | Status: DC
  Administered 2020-08-10: 3 [IU] via SUBCUTANEOUS

## 2020-08-10 MED ORDER — EPHEDRINE SULFATE-NACL 50-0.9 MG/10ML-% IV SOSY
PREFILLED_SYRINGE | INTRAVENOUS | Status: DC | PRN
  Administered 2020-08-10 (×4): 10 mg via INTRAVENOUS

## 2020-08-10 MED ORDER — MODAFINIL 200 MG PO TABS
200.0000 mg | ORAL_TABLET | Freq: Every day | ORAL | Status: DC
  Administered 2020-08-11 – 2020-08-16 (×6): 200 mg via ORAL
  Filled 2020-08-10 (×6): qty 1

## 2020-08-10 MED ORDER — ACETAMINOPHEN 500 MG PO TABS
1000.0000 mg | ORAL_TABLET | Freq: Three times a day (TID) | ORAL | Status: DC
  Administered 2020-08-10 – 2020-08-15 (×13): 1000 mg via ORAL
  Filled 2020-08-10 (×13): qty 2

## 2020-08-10 MED ORDER — SUGAMMADEX SODIUM 200 MG/2ML IV SOLN
INTRAVENOUS | Status: DC | PRN
  Administered 2020-08-10: 500 mg via INTRAVENOUS

## 2020-08-10 MED ORDER — CHLORHEXIDINE GLUCONATE 4 % EX LIQD: 60.0000 mL | Freq: Once | CUTANEOUS | Status: DC

## 2020-08-10 MED ORDER — ACETAMINOPHEN 500 MG PO TABS
1000.0000 mg | ORAL_TABLET | ORAL | Status: AC
  Administered 2020-08-10: 1000 mg via ORAL
  Filled 2020-08-10: qty 2

## 2020-08-10 MED ORDER — APREPITANT 40 MG PO CAPS
40.0000 mg | ORAL_CAPSULE | ORAL | Status: AC
  Administered 2020-08-10: 40 mg via ORAL
  Filled 2020-08-10: qty 1

## 2020-08-10 MED ORDER — HEPARIN SODIUM (PORCINE) 5000 UNIT/ML IJ SOLN
5000.0000 [IU] | INTRAMUSCULAR | Status: AC
  Administered 2020-08-10: 5000 [IU] via SUBCUTANEOUS
  Filled 2020-08-10: qty 1

## 2020-08-10 MED ORDER — SCOPOLAMINE 1 MG/3DAYS TD PT72: 1.0000 | MEDICATED_PATCH | TRANSDERMAL | Status: DC

## 2020-08-10 MED ORDER — PANTOPRAZOLE SODIUM 40 MG IV SOLR
40.0000 mg | Freq: Every day | INTRAVENOUS | Status: DC
  Administered 2020-08-10 – 2020-08-15 (×6): 40 mg via INTRAVENOUS
  Filled 2020-08-10 (×6): qty 40

## 2020-08-10 MED ORDER — FIBRIN SEALANT 2 ML SINGLE DOSE KIT
2.0000 mL | PACK | Freq: Once | CUTANEOUS | Status: AC
  Administered 2020-08-10: 2 mL via TOPICAL
  Filled 2020-08-10: qty 2

## 2020-08-10 MED ORDER — FENTANYL CITRATE (PF) 250 MCG/5ML IJ SOLN
INTRAMUSCULAR | Status: DC | PRN
  Administered 2020-08-10 (×2): 50 ug via INTRAVENOUS

## 2020-08-10 MED ORDER — CHLORHEXIDINE GLUCONATE 0.12 % MT SOLN
15.0000 mL | Freq: Once | OROMUCOSAL | Status: AC
  Administered 2020-08-10: 15 mL via OROMUCOSAL

## 2020-08-10 MED ORDER — DEXAMETHASONE SODIUM PHOSPHATE 10 MG/ML IJ SOLN
INTRAMUSCULAR | Status: AC
  Filled 2020-08-10: qty 1

## 2020-08-10 MED ORDER — DEXAMETHASONE SODIUM PHOSPHATE 4 MG/ML IJ SOLN
4.0000 mg | INTRAMUSCULAR | Status: AC
  Administered 2020-08-10: 5 mg via INTRAVENOUS

## 2020-08-10 MED ORDER — KETAMINE HCL 10 MG/ML IJ SOLN
INTRAMUSCULAR | Status: DC | PRN
  Administered 2020-08-10: 30 mg via INTRAVENOUS

## 2020-08-10 MED ORDER — FENTANYL CITRATE (PF) 100 MCG/2ML IJ SOLN: 25.0000 ug | INTRAMUSCULAR | Status: DC | PRN

## 2020-08-10 MED ORDER — OXYCODONE HCL 5 MG/5ML PO SOLN: 5.0000 mg | Freq: Four times a day (QID) | ORAL | Status: DC | PRN

## 2020-08-10 MED ORDER — BUPIVACAINE LIPOSOME 1.3 % IJ SUSP
INTRAMUSCULAR | Status: DC | PRN
  Administered 2020-08-10: 20 mL

## 2020-08-10 MED ORDER — KETAMINE HCL 10 MG/ML IJ SOLN
INTRAMUSCULAR | Status: AC
  Filled 2020-08-10: qty 1

## 2020-08-10 MED ORDER — RANOLAZINE ER 500 MG PO TB12
500.0000 mg | ORAL_TABLET | Freq: Two times a day (BID) | ORAL | Status: DC
  Administered 2020-08-11 – 2020-08-16 (×7): 500 mg via ORAL
  Filled 2020-08-10 (×11): qty 1

## 2020-08-10 MED ORDER — ONDANSETRON HCL 4 MG/2ML IJ SOLN: 4.0000 mg | Freq: Once | INTRAMUSCULAR | Status: DC | PRN

## 2020-08-10 MED ORDER — PHENYLEPHRINE HCL-NACL 10-0.9 MG/250ML-% IV SOLN
INTRAVENOUS | Status: AC
  Filled 2020-08-10: qty 250

## 2020-08-10 MED ORDER — ONDANSETRON HCL 4 MG/2ML IJ SOLN
4.0000 mg | Freq: Four times a day (QID) | INTRAMUSCULAR | Status: DC | PRN
  Administered 2020-08-12 – 2020-08-13 (×4): 4 mg via INTRAVENOUS
  Filled 2020-08-10 (×4): qty 2

## 2020-08-10 MED ORDER — FENTANYL CITRATE (PF) 250 MCG/5ML IJ SOLN
INTRAMUSCULAR | Status: AC
  Filled 2020-08-10: qty 5

## 2020-08-10 MED ORDER — ROCURONIUM BROMIDE 10 MG/ML (PF) SYRINGE
PREFILLED_SYRINGE | INTRAVENOUS | Status: DC | PRN
  Administered 2020-08-10: 80 mg via INTRAVENOUS
  Administered 2020-08-10 (×2): 20 mg via INTRAVENOUS

## 2020-08-10 MED ORDER — DIPHENHYDRAMINE HCL 50 MG/ML IJ SOLN: 12.5000 mg | Freq: Three times a day (TID) | INTRAMUSCULAR | Status: DC | PRN

## 2020-08-10 MED ORDER — ONDANSETRON HCL 4 MG/2ML IJ SOLN
INTRAMUSCULAR | Status: DC | PRN
  Administered 2020-08-10: 4 mg via INTRAVENOUS

## 2020-08-10 MED ORDER — CHLORHEXIDINE GLUCONATE 4 % EX LIQD: 60.0000 mL | Freq: Once | CUTANEOUS | Status: AC

## 2020-08-10 MED ORDER — LACTATED RINGERS IR SOLN
Status: DC | PRN
  Administered 2020-08-10: 1000 mL

## 2020-08-10 MED ORDER — GABAPENTIN 300 MG PO CAPS
300.0000 mg | ORAL_CAPSULE | ORAL | Status: AC
  Administered 2020-08-10: 300 mg via ORAL
  Filled 2020-08-10: qty 1

## 2020-08-10 MED ORDER — METOPROLOL TARTRATE 25 MG PO TABS
25.0000 mg | ORAL_TABLET | Freq: Two times a day (BID) | ORAL | Status: DC
  Administered 2020-08-11 – 2020-08-16 (×9): 25 mg via ORAL
  Filled 2020-08-10 (×10): qty 1

## 2020-08-10 SURGICAL SUPPLY — 82 items
APL LAPSCP 35 DL APL RGD (MISCELLANEOUS) ×6
APL PRP STRL LF DISP 70% ISPRP (MISCELLANEOUS) ×6
APL SWBSTK 6 STRL LF DISP (MISCELLANEOUS)
APPLICATOR COTTON TIP 6 STRL (MISCELLANEOUS) IMPLANT
APPLICATOR COTTON TIP 6IN STRL (MISCELLANEOUS)
APPLICATOR VISTASEAL 35 (MISCELLANEOUS) ×8 IMPLANT
APPLIER CLIP ROT 13.4 12 LRG (CLIP)
APR CLP LRG 13.4X12 ROT 20 MLT (CLIP)
BLADE SURG SZ11 CARB STEEL (BLADE) ×4 IMPLANT
BNDG ADH 1X3 SHEER STRL LF (GAUZE/BANDAGES/DRESSINGS) IMPLANT
BNDG ADH THN 3X1 STRL LF (GAUZE/BANDAGES/DRESSINGS)
CABLE HIGH FREQUENCY MONO STRZ (ELECTRODE) IMPLANT
CHLORAPREP W/TINT 26 (MISCELLANEOUS) ×8 IMPLANT
CLIP APPLIE ROT 13.4 12 LRG (CLIP) IMPLANT
CLIP SUT LAPRA TY ABSORB (SUTURE) ×8 IMPLANT
COVER WAND RF STERILE (DRAPES) IMPLANT
CUTTER FLEX LINEAR 45M (STAPLE) IMPLANT
DEVICE SUT QUICK LOAD TK 5 (STAPLE) IMPLANT
DEVICE SUT TI-KNOT TK 5X26 (MISCELLANEOUS) IMPLANT
DEVICE SUTURE ENDOST 10MM (ENDOMECHANICALS) ×4 IMPLANT
DRAIN PENROSE 0.25X18 (DRAIN) ×4 IMPLANT
DRSG TEGADERM 2-3/8X2-3/4 SM (GAUZE/BANDAGES/DRESSINGS) ×9 IMPLANT
ELECT REM PT RETURN 15FT ADLT (MISCELLANEOUS) ×4 IMPLANT
GAUZE 4X4 16PLY RFD (DISPOSABLE) ×4 IMPLANT
GAUZE SPONGE 2X2 8PLY STRL LF (GAUZE/BANDAGES/DRESSINGS) ×3 IMPLANT
GAUZE SPONGE 4X4 12PLY STRL (GAUZE/BANDAGES/DRESSINGS) IMPLANT
GLOVE SURG ENC MOIS LTX SZ7.5 (GLOVE) IMPLANT
GLOVE SURG UNDER LTX SZ8 (GLOVE) ×4 IMPLANT
GOWN STRL REUS W/TWL XL LVL3 (GOWN DISPOSABLE) ×16 IMPLANT
KIT BASIN OR (CUSTOM PROCEDURE TRAY) ×4 IMPLANT
KIT GASTRIC LAVAGE 34FR ADT (SET/KITS/TRAYS/PACK) ×4 IMPLANT
KIT TURNOVER KIT A (KITS) ×4 IMPLANT
MARKER SKIN DUAL TIP RULER LAB (MISCELLANEOUS) ×4 IMPLANT
MAT PREVALON FULL STRYKER (MISCELLANEOUS) ×4 IMPLANT
NDL SPNL 22GX3.5 QUINCKE BK (NEEDLE) ×3 IMPLANT
NEEDLE SPNL 22GX3.5 QUINCKE BK (NEEDLE) ×4 IMPLANT
PACK CARDIOVASCULAR III (CUSTOM PROCEDURE TRAY) ×4 IMPLANT
PENCIL SMOKE EVACUATOR (MISCELLANEOUS) IMPLANT
RELOAD 45 VASCULAR/THIN (ENDOMECHANICALS) IMPLANT
RELOAD ENDO STITCH 2.0 (ENDOMECHANICALS) ×36
RELOAD STAPLE 45 2.5 WHT GRN (ENDOMECHANICALS) IMPLANT
RELOAD STAPLE 45 3.5 BLU ETS (ENDOMECHANICALS) IMPLANT
RELOAD STAPLE 60 2.6 WHT THN (STAPLE) ×6 IMPLANT
RELOAD STAPLE 60 3.6 BLU REG (STAPLE) ×6 IMPLANT
RELOAD STAPLE 60 3.8 GOLD REG (STAPLE) ×3 IMPLANT
RELOAD STAPLE TA45 3.5 REG BLU (ENDOMECHANICALS) IMPLANT
RELOAD STAPLER BLUE 60MM (STAPLE) ×12 IMPLANT
RELOAD STAPLER GOLD 60MM (STAPLE) ×3 IMPLANT
RELOAD STAPLER WHITE 60MM (STAPLE) ×6 IMPLANT
RELOAD SUT SNGL STCH ABSRB 2-0 (ENDOMECHANICALS) ×15 IMPLANT
RELOAD SUT SNGL STCH BLK 2-0 (ENDOMECHANICALS) ×12 IMPLANT
SCISSORS LAP 5X45 EPIX DISP (ENDOMECHANICALS) ×4 IMPLANT
SET IRRIG TUBING LAPAROSCOPIC (IRRIGATION / IRRIGATOR) ×4 IMPLANT
SET TUBE SMOKE EVAC HIGH FLOW (TUBING) ×4 IMPLANT
SHEARS HARMONIC ACE PLUS 45CM (MISCELLANEOUS) ×4 IMPLANT
SLEEVE XCEL OPT CAN 5 100 (ENDOMECHANICALS) ×4 IMPLANT
SOL ANTI FOG 6CC (MISCELLANEOUS) ×3 IMPLANT
SOLUTION ANTI FOG 6CC (MISCELLANEOUS) ×1
SPONGE GAUZE 2X2 STER 10/PKG (GAUZE/BANDAGES/DRESSINGS) ×1
STAPLER ECHELON BIOABSB 60 FLE (MISCELLANEOUS) IMPLANT
STAPLER ECHELON LONG 60 440 (INSTRUMENTS) ×4 IMPLANT
STAPLER RELOAD BLUE 60MM (STAPLE) ×16
STAPLER RELOAD GOLD 60MM (STAPLE) ×4
STAPLER RELOAD WHITE 60MM (STAPLE) ×8
STRIP CLOSURE SKIN 1/2X4 (GAUZE/BANDAGES/DRESSINGS) ×19 IMPLANT
SURGILUBE 2OZ TUBE FLIPTOP (MISCELLANEOUS) ×4 IMPLANT
SUT MNCRL AB 4-0 PS2 18 (SUTURE) ×4 IMPLANT
SUT RELOAD ENDO STITCH 2 48X1 (ENDOMECHANICALS) ×15
SUT RELOAD ENDO STITCH 2.0 (ENDOMECHANICALS) ×12
SUT SURGIDAC NAB ES-9 0 48 120 (SUTURE) IMPLANT
SUT VIC AB 2-0 SH 27 (SUTURE) ×4
SUT VIC AB 2-0 SH 27X BRD (SUTURE) ×3 IMPLANT
SUTURE RELOAD END STTCH 2 48X1 (ENDOMECHANICALS) ×15 IMPLANT
SUTURE RELOAD ENDO STITCH 2.0 (ENDOMECHANICALS) ×12 IMPLANT
SYR 20ML LL LF (SYRINGE) ×8 IMPLANT
TOWEL OR 17X26 10 PK STRL BLUE (TOWEL DISPOSABLE) ×4 IMPLANT
TOWEL OR NON WOVEN STRL DISP B (DISPOSABLE) ×4 IMPLANT
TRAY FOLEY MTR SLVR 16FR STAT (SET/KITS/TRAYS/PACK) IMPLANT
TROCAR BLADELESS OPT 5 100 (ENDOMECHANICALS) ×4 IMPLANT
TROCAR UNIVERSAL OPT 12M 100M (ENDOMECHANICALS) ×12 IMPLANT
TROCAR XCEL 12X100 BLDLESS (ENDOMECHANICALS) ×4 IMPLANT
TUBING CONNECTING 10 (TUBING) ×8 IMPLANT

## 2020-08-10 NOTE — Anesthesia Procedure Notes (Addendum)
Procedure Name: Intubation Date/Time: 08/10/2020 7:35 AM Performed by: West Pugh, CRNA Pre-anesthesia Checklist: Patient identified, Emergency Drugs available, Suction available, Patient being monitored and Timeout performed Patient Re-evaluated:Patient Re-evaluated prior to induction Oxygen Delivery Method: Circle system utilized Preoxygenation: Pre-oxygenation with 100% oxygen Induction Type: IV induction Ventilation: Mask ventilation without difficulty Laryngoscope Size: Mac and 3 Grade View: Grade II Tube type: Oral Tube size: 7.0 mm Number of attempts: 1 Airway Equipment and Method: Stylet Placement Confirmation: ETT inserted through vocal cords under direct vision,  positive ETCO2 and breath sounds checked- equal and bilateral Secured at: 21 cm Tube secured with: Tape Dental Injury: Teeth and Oropharynx as per pre-operative assessment  Comments: AOI

## 2020-08-10 NOTE — Progress Notes (Signed)
PHARMACY CONSULT FOR:  Risk Assessment for Post-Discharge VTE Following Bariatric Surgery  Post-Discharge VTE Risk Assessment: This patient's probability of 30-day post-discharge VTE is increased due to the factors marked:   Female    Age >/=60 years    BMI >/=50 kg/m2    CHF    Dyspnea at Rest    Paraplegia  x  Non-gastric-band surgery    Operation Time >/=3 hr    Return to OR     Length of Stay >/= 3 d   Hx of VTE   Hypercoagulable condition   Significant venous stasis       Predicted probability of 30-day post-discharge VTE: 0.16%  Other patient-specific factors to consider:   Recommendation for Discharge: No pharmacologic prophylaxis post-discharge    [name] is a 60 y.o. 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 Crestor [Rosuvastatin] Other (See Comments)    Myalgias and rising CPK  . Losartan Cough  . Wellbutrin [Bupropion] Other (See Comments)    Dizziness and mental status changes  . Atorvastatin     myalgias  . Amoxicillin Rash  . Aspirin Nausea Only and Other (See Comments)    GI upset REACTION: Upset stomach  . Codeine Rash  . Erythromycin Rash and Other (See Comments)  . Penicillins Rash    Reaction: 15 years    Patient Measurements: Height: 5\' 4"  (162.6 cm) Weight: 108.6 kg (239 lb 6.4 oz) IBW/kg (Calculated) : 54.7 Body mass index is 41.09 kg/m.  No results for input(s): WBC, HGB, HCT, PLT, APTT, CREATININE, LABCREA, CREATININE, CREAT24HRUR, MG, PHOS, ALBUMIN, PROT, ALBUMIN, AST, ALT, ALKPHOS, BILITOT, BILIDIR, IBILI in the last 72 hours. Estimated Creatinine Clearance: 68.8 mL/min (A) (by C-G formula based on SCr of 1.06 mg/dL (H)).    Past Medical History:  Diagnosis Date  . Acute bronchitis 05/25/2016  . Anxiety   . Arthritis   . Chronic pain syndrome 01/06/2013  . Chronic rhinosinusitis   . Colon polyps   . Cough 01/06/2013   . Depression   . Diabetes ([place]) 01/06/2013  . Diabetes mellitus type 2 in obese [facility]) 01/06/2013   Sees Dr Calvert Cantor for eye exam Does not see podiatry, foot exam today unremarkable except for thick cracking skin on heals  pt denies   . Dyspnea    with exertion   . Dysuria 05/25/2016  . Edema 01/06/2013  . Epistaxis 05/25/2016  . Fibroid, uterine   . Fibromyalgia   . GERD (gastroesophageal reflux disease)   . Glaucoma 12-13  . Hoarseness   . Hoarseness of voice 05/11/2013  . Hyperglycemia 04/13/2013  . Hyperlipemia, mixed 06/06/2007   Qualifier: Diagnosis of  By: Wynona Luna She feels Lipitor caused increased low back pain and weakness    . Hyperlipidemia   . Hypertension   . Hypokalemia 02/05/2013  . IBS (irritable bowel syndrome) 02/13/2011  . Low back pain 03/03/2013  . Muscle cramp 05/22/2014  . Obesity   . Obesity, unspecified 05/11/2013  . OSA (obstructive sleep apnea)    cpap   . Pain in joint, shoulder region 06/27/2015  . Perimenopause 10/05/2012  . Pre-diabetes   . Raynaud disease 06/16/2013  . Thyroid disease    hypothyroidism  . Vocal fold nodules      Medications Prior to Admission  Medication Sig Dispense Refill Last Dose  . acetaminophen (TYLENOL) 325 MG tablet  Take 650 mg by mouth every 6 (six) hours as needed for moderate pain or mild pain.   Past Week at Unknown time  . albuterol (VENTOLIN HFA) 108 (90 Base) MCG/ACT inhaler INHALE 2 PUFFS INTO THE LUNGS EVERY 4 (FOUR) HOURS AS NEEDED FOR WHEEZING OR SHORTNESS OF BREATH. 18 g 5 Past Week at Unknown time  . ARMOUR THYROID 90 MG tablet Take 90 mg by mouth daily.   08/10/2020 at 0330  . aspirin EC 81 MG tablet Take 1 tablet (81 mg total) by mouth daily.   08/07/2020  . busPIRone (BUSPAR) 7.5 MG tablet Take 1 tablet (7.5 mg total) by mouth 3 (three) times daily. 90 tablet 0 08/10/2020 at 0330  . Calcium Carbonate 500 MG CHEW Chew 500 mg by mouth daily at 12 noon.   08/09/2020 at Unknown time  . Cod Liver Oil 1000 MG  CAPS Take 1,000 mg by mouth daily.   08/09/2020 at Unknown time  . diphenhydrAMINE (BENADRYL) 25 MG tablet Take 25 mg by mouth every 6 (six) hours as needed.   Past Week at Unknown time  . ezetimibe (ZETIA) 10 MG tablet TAKE 1 TABLET (10 MG TOTAL) BY MOUTH DAILY. 90 tablet 3 08/09/2020 at Unknown time  . furosemide (LASIX) 20 MG tablet TAKE 1 TABLET (20 MG TOTAL) BY MOUTH DAILY AS NEEDED. (Patient taking differently: Take 20 mg by mouth daily as needed for fluid or edema.) 90 tablet 1 08/09/2020 at Unknown time  . gabapentin (NEURONTIN) 300 MG capsule TAKE 1-2 CAPSULES (300-600 MG TOTAL) BY MOUTH AT BEDTIME. 180 capsule 1 08/09/2020 at Unknown time  . ipratropium (ATROVENT) 0.06 % nasal spray Place 2 sprays into the nose daily.   08/09/2020 at Unknown time  . liothyronine (CYTOMEL) 5 MCG tablet TAKE 1 TABLET (5 MCG TOTAL) BY MOUTH ONCE DAILY ON AN EMPTY STOMACH WITH A GLASS OF WATER AT LEAST 30-60 MINUTES BEFORE BREAKFAST. 30 tablet 11 08/09/2020 at Unknown time  . loratadine (CLARITIN) 10 MG tablet Take 20 mg by mouth daily.   Past Week at Unknown time  . metFORMIN (GLUCOPHAGE) 1000 MG tablet Take 0.5 tablets (500 mg total) by mouth daily with breakfast. 45 tablet 0 08/07/2020  . metoprolol succinate (TOPROL-XL) 100 MG 24 hr tablet TAKE 1 TABLET (100 MG TOTAL) BY MOUTH IN THE MORNING AND AT BEDTIME. TAKE WITH OR IMMEDIATELY FOLLOWING A MEAL. 180 tablet 1 08/10/2020 at 0330  . Milnacipran (SAVELLA) 50 MG TABS tablet TAKE 1 TABLET (50 MG TOTAL) BY MOUTH 2 (TWO) TIMES DAILY. *START AFTER TITRATION PACK IS COMPLETE* 180 tablet 1 08/09/2020 at Unknown time  . Milnacipran HCl 12.5 & 25 & 50 MG MISC TAKE 12.5MG  TAB DAILY X 1 DAY, 12.5MG  IN AM & AT BEDTIME X2DAYS,25MG  IN AM & AT BEDTIME X4DAYS, 50MG  IN AM & AT BEDTIME FOR 21 DAYS 55 each 0 08/09/2020 at Unknown time  . modafinil (PROVIGIL) 200 MG tablet TAKE 2 TABLETS BY MOUTH DAILY 180 tablet 1 08/09/2020 at Unknown time  . Multiple Vitamins-Minerals (MULTIVITAMIN WITH  MINERALS) tablet Take 1 tablet by mouth daily.   08/09/2020 at Unknown time  . Plecanatide 3 MG TABS TAKE 1 TABLET BY MOUTH ONCE DAILY 90 tablet 1 08/09/2020 at Unknown time  . ranolazine (RANEXA) 500 MG 12 hr tablet TAKE 1 TABLET (500 MG TOTAL) BY MOUTH 2 (TWO) TIMES DAILY. 60 tablet 3 08/10/2020 at 0330  . spironolactone (ALDACTONE) 50 MG tablet TAKE 1 TABLET (50 MG TOTAL) BY MOUTH 3 (  THREE) TIMES DAILY. 270 tablet 3 08/09/2020 at Unknown time  . thyroid (ARMOUR) 90 MG tablet TAKE 1 TABLET (90 MG TOTAL) BY MOUTH ONCE DAILY 90 tablet 3 08/10/2020 at 0330  . TRULANCE 3 MG TABS Take 3 mg by mouth daily.   08/09/2020 at Unknown time  . Vitamin D, Ergocalciferol, (DRISDOL) 1.25 MG (50000 UNIT) CAPS capsule TAKE 1 CAPSULE (50,000 UNITS TOTAL) BY MOUTH EVERY 7 (SEVEN) DAYS. 12 capsule 1 08/09/2020 at Unknown time  . Evolocumab 140 MG/ML SOAJ INJECT 1 DOSE INTO THE SKIN EVERY 14 (FOURTEEN) DAYS. (Patient taking differently: Inject 140 mg into the skin every 14 (fourteen) days.) 6 mL 3 07/26/2020  . FREESTYLE LITE test strip USE TO CHECK BLOOD SUGAR EVERY DAY AND AS NEEDED 100 strip 5 supply  . gabapentin (NEURONTIN) 100 MG capsule Take 2 capsules (200 mg total) by mouth 2 (two) times daily. (Patient not taking: Reported on 07/21/2020) 120 capsule 1 Not Taking at Unknown time  . hyoscyamine (LEVSIN SL) 0.125 MG SL tablet Place 1 tablet (0.125 mg total) under the tongue every 4 (four) hours as needed. (Patient not taking: Reported on 07/21/2020) 40 tablet 1 Not Taking at Unknown time  . polyethylene glycol-electrolytes (NULYTELY) 420 g solution USE AS DIRECTED 4000 mL 0 Unknown at Unknown time  . SYNTHROID 75 MCG tablet TAKE 1 TABLET (75 MCG TOTAL) BY MOUTH ONCE DAILY ON AN EMPTY STOMACH WITH A GLASS OF WATER AT LEAST 30-60 MINUTES BEFORE BREAKFAST. 30 tablet 11 does not take  . thyroid (ARMOUR) 90 MG tablet TAKE 1 TABLET (90 MG TOTAL) BY MOUTH ONCE DAILY 30 tablet 6 duplicate       Kara Mead 08/10/2020,1:20 PM

## 2020-08-10 NOTE — Anesthesia Postprocedure Evaluation (Signed)
Anesthesia Post Note  Patient: [name]  Procedure(s) Performed: LAPAROSCOPIC ROUX-EN-Y GASTRIC BYPASS WITH UPPER ENDOSCOPY (N/A Abdomen) UPPER GI ENDOSCOPY (N/A Esophagus) LAPAROSCOPIC PRIMARY REPAIR OF INCISIONAL HERNIA (Abdomen)     Patient location during evaluation: PACU Anesthesia Type: General Level of consciousness: awake and alert Pain management: pain level controlled Vital Signs Assessment: post-procedure vital signs reviewed and stable Respiratory status: spontaneous breathing, nonlabored ventilation and respiratory function stable Cardiovascular status: blood pressure returned to baseline and stable Postop Assessment: no apparent nausea or vomiting Anesthetic complications: no   No complications documented.  Last Vitals:  Vitals:   08/10/20 1030 08/10/20 1045  BP: (!) 144/80 (!) 145/86  Pulse: 86 86  Resp: 15 15  Temp:    SpO2: 100% 100%    Last Pain:  Vitals:   08/10/20 1045  TempSrc:   PainSc: [place]

## 2020-08-10 NOTE — Interval H&P Note (Signed)
History and Physical Interval Note:  08/10/2020 7:12 AM  [name]  has presented today for surgery, with the diagnosis of MORBID OBESITY.  The various methods of treatment have been discussed with the patient and family. After consideration of risks, benefits and other options for treatment, the patient has consented to  Procedure(s): LAPAROSCOPIC ROUX-EN-Y GASTRIC BYPASS WITH UPPER ENDOSCOPY (N/A) UPPER GI ENDOSCOPY (N/A) as a surgical intervention.  The patient's history has been reviewed, patient examined, no change in status, stable for surgery.  I have reviewed the patient's chart and labs.  Questions were answered to the patient's satisfaction.    [name]. [name], MD, FACS General, Bariatric, & Minimally Invasive Surgery [facility]  [name]

## 2020-08-10 NOTE — Op Note (Signed)
Preoperative diagnosis: Roux-en-Y gastric bypass and laparoscopic sleeve gastrectomy  Postoperative diagnosis: Same   Procedure: Upper endoscopy   Surgeon: Clovis Riley, M.D.  Anesthesia: Gen.   Description of procedure: The endoscope was placed in the mouth and oropharynx and under endoscopic vision it was advanced to the esophagogastric junction which was identified at 40cm from the teeth.  The pouch was tensely insufflated while Dr. [name] occluded the Roux limb with a bowel clamp and the upper abdomen was flooded with irrigation to perform a leak test, which was negative. No bubbles were seen.  The staple line and anastomosis were hemostatic; the anastomosis was widely patent and measured 45 cm from the teeth.  The lumen was decompressed and the scope was withdrawn without difficulty.    Clovis Riley, M.D. General, Bariatric, & Minimally Invasive Surgery [facility]

## 2020-08-10 NOTE — Progress Notes (Signed)
Patient ambulated around the hallway, has demonstrated IS use, c/o no pain or nausea. Has finished her 6 cups of water and is now beginning protein.

## 2020-08-10 NOTE — Progress Notes (Signed)
Discussed post op day goals with patient including ambulation, IS, diet progression, pain, and nausea control.  BSTOP education provided including BSTOP information guide, "Guide for Pain Management after your Bariatric Procedure".  Questions answered. 

## 2020-08-10 NOTE — Transfer of Care (Signed)
Immediate Anesthesia Transfer of Care Note  Patient: [name]  Procedure(s) Performed: LAPAROSCOPIC ROUX-EN-Y GASTRIC BYPASS WITH UPPER ENDOSCOPY (N/A Abdomen) UPPER GI ENDOSCOPY (N/A Esophagus) LAPAROSCOPIC PRIMARY REPAIR OF INCISIONAL HERNIA (Abdomen)  Patient Location: PACU  Anesthesia Type:General  Level of Consciousness: drowsy and patient cooperative  Airway & Oxygen Therapy: Patient Spontanous Breathing and Patient connected to face mask oxygen  Post-op Assessment: Report given to RN and Post -op Vital signs reviewed and stable  Post vital signs: Reviewed and stable  Last Vitals:  Vitals Value Taken Time  BP 145/90 08/10/20 1022  Temp    Pulse 87 08/10/20 1025  Resp 16 08/10/20 1025  SpO2 100 % 08/10/20 1025  Vitals shown include unvalidated device data.  Last Pain:  Vitals:   08/10/20 0551  TempSrc:   PainSc: 0-No pain         Complications: No complications documented.

## 2020-08-10 NOTE — Op Note (Signed)
DENNICE TINDOL [id number] [birth year]. 08/10/2020  Preoperative diagnosis:  Severe obesity BMI 41 hypertension,  Dyslipidemia,  obstructive sleep apnea on CPAP,  osteoarthritis of bilateral knees,  carpal tunnel,  diabetes mellitus 2 Fibromyalgia Incisional hernia  Postoperative  diagnosis:  1. same  Surgical procedure: Laparoscopic Roux-en-Y gastric bypass (ante-colic, ante-gastric); upper endoscopy; laparoscopic primary repair of incisional hernia; laparoscopic bilateral tap block  Surgeon: Gayland Curry, M.D. FACS  Asst.: Romana Juniper  MD FACS  Anesthesia: General plus exparel/marcaine mix  Complications: None   EBL: Minimal   Drains: None   Disposition: PACU in good condition   Indications for procedure: 60 y.o. yo female with morbid obesity who has been unsuccessful at sustained weight loss. The patient's comorbidities are listed above. We discussed the risk and benefits of surgery including but not limited to anesthesia risk, bleeding, infection, blood clot formation, anastomotic leak, anastomotic stricture, ulcer formation, death, respiratory complications, intestinal blockage, internal hernia, gallstone formation, vitamin and nutritional deficiencies, injury to surrounding structures, failure to lose weight and mood changes.   Description of procedure: Patient is brought to the operating room and general anesthesia induced. The patient had received preoperative broad-spectrum IV antibiotics and subcutaneous heparin. The abdomen was widely sterilely prepped with Chloraprep and draped. Patient timeout was performed and correct patient and procedure confirmed. Access was obtained with a 12 mm Optiview trocar in the left upper quadrant and pneumoperitoneum established without difficulty.  The patient had a prior small infraumbilical transverse incision and had a known recurrent hernia.  We discussed that we would have to probably take down any omentum to this which would leave  the fascial defect open and it would need to be closed at the conclusion of the procedure.  There was some omental attachments in the lower midline to the abdominal wall as well as a recurrent umbilical hernia.  It really did not contain any omentum.  Under direct vision 12 mm trocars were placed laterally in the right upper quadrant, right upper quadrant midclavicular line, and to the left and above the umbilicus for the camera port. A 5 mm trocar was placed laterally in the left upper quadrant.  Exparel/marcaine mix was infiltrated in bilateral lateral abdominal walls as a TAP block.  I took down the omental attachments to the anterior abdominal wall with harmonic scalpel.  The omentum was brought into the upper abdomen and the transverse mesocolon elevated and the ligament of Treitz clearly identified. A 40 cm biliopancreatic limb was then carefully measured from the ligament of Treitz. The small intestine was divided at this point with a single firing of the white load linear stapler. A Penrose drain was sutured to the end of the Roux-en-Y limb for later identification. A 100 cm Roux-en-Y limb was then carefully measured. At this point a side-to-side anastomosis was created between the Roux limb and the end of the biliopancreatic limb. This was accomplished with a single firing of the 60 mm white load linear stapler. The common enterotomy was closed with a running 2-0 Vicryl begun at either end of the enterotomy and tied centrally. Vistaseal tissue sealant was placed over the anastomosis. The mesenteric defect was then closed with running 2-0 silk. The omentum was then divided with the harmonic scalpel up towards the transverse colon to allow mobility of the Roux limb toward the gastric pouch. The patient was then placed in steep reversed Trendelenburg. Through a 5 mm subxiphoid site the [facility] retractor was placed and the left lobe of the  liver elevated with excellent exposure of the upper stomach and  hiatus. The angle of Hiss was then mobilized with the harmonic scalpel. A 5 cm gastric pouch was then carefully measured along the lesser curve of the stomach. Dissection was carried along the lesser curve at this point with the Harmonic scalpel working carefully back toward the lesser sac at right angles to the lesser curve. The free lesser sac was then entered. After being sure all tubes were removed from the stomach an initial firing of the gold load 60 mm linear stapler was fired at right angles across the lesser curve for about 4 cm. The gastric pouch was further mobilized posteriorly and then the pouch was completed with 3 further firings of the 60 mm blue load linear stapler up through the previously dissected angle of His. It was ensured that the pouch was completely mobilized away from the gastric remnant. This created a nice tubular 4-5 cm gastric pouch. The Roux limb was then brought up in an antecolic fashion with the candycane facing to the patient's left without undue tension. The gastrojejunostomy was created with an initial posterior row of 2-0 Vicryl between the Roux limb and the staple line of the gastric pouch. Enterotomies were then made in the gastric pouch and the Roux limb with the harmonic scalpel and at approximately 2-2-1/2 cm anastomosis was created with a single firing of the 79mm blue load linear stapler. The staple line was inspected and was intact without bleeding. The common enterotomy was then closed with running 2-0 Vicryl begun at either end and tied centrally. The Ewall tube was then easily passed through the anastomosis and an outer anterior layer of running 2-0 Vicryl was placed. The Ewald tube was removed. With the outlet of the gastrojejunostomy clamped and under saline irrigation the assistant performed upper endoscopy and with the gastric pouch tensely distended with air-there was no evidence of leak on this test. The pouch was desufflated. The Terance Hart defect was closed  with running 2-0 silk. The abdomen was inspected for any evidence of bleeding or bowel injury and everything looked fine. The Nathanson retractor was removed under direct vision after coating the anastomosis with Vistaseal tissue sealant.   At this point I primarily repaired the fascial defect at the umbilicus with 2 interrupted 0 Vicryl's using the PMI suture passer to obliterate the fascial defect.  Mesh was not used since it was not a clean procedure.  Defect was about 2 cm prior to repair.  All CO2 was evacuated and trochars removed. Skin incisions were closed with 4-0 monocryl in a subcuticular fashion followed by  steri-strips and bandages. Sponge needle and instrument counts were correct. The patient was taken to the PACU in good condition.    [name]. [name], MD, FACS General, Bariatric, & Minimally Invasive Surgery [facility], [place]

## 2020-08-11 ENCOUNTER — Other Ambulatory Visit (HOSPITAL_COMMUNITY): Payer: Self-pay

## 2020-08-11 ENCOUNTER — Encounter (HOSPITAL_COMMUNITY): Payer: Self-pay | Admitting: General Surgery

## 2020-08-11 DIAGNOSIS — Z9884 Bariatric surgery status: Secondary | ICD-10-CM

## 2020-08-11 LAB — GLUCOSE, CAPILLARY
Glucose-Capillary: 106 mg/dL — ABNORMAL HIGH (ref 70–99)
Glucose-Capillary: 130 mg/dL — ABNORMAL HIGH (ref 70–99)
Glucose-Capillary: 37 mg/dL — CL (ref 70–99)
Glucose-Capillary: 45 mg/dL — ABNORMAL LOW (ref 70–99)
Glucose-Capillary: 59 mg/dL — ABNORMAL LOW (ref 70–99)
Glucose-Capillary: 77 mg/dL (ref 70–99)
Glucose-Capillary: 84 mg/dL (ref 70–99)
Glucose-Capillary: 94 mg/dL (ref 70–99)
Glucose-Capillary: 98 mg/dL (ref 70–99)

## 2020-08-11 LAB — CBC WITH DIFFERENTIAL/PLATELET
Abs Immature Granulocytes: 0.04 10*3/uL (ref 0.00–0.07)
Basophils Absolute: 0 10*3/uL (ref 0.0–0.1)
Basophils Relative: 0 %
Eosinophils Absolute: 0 10*3/uL (ref 0.0–0.5)
Eosinophils Relative: 0 %
HCT: 37.3 % (ref 36.0–46.0)
Hemoglobin: 12.7 g/dL (ref 12.0–15.0)
Immature Granulocytes: 0 %
Lymphocytes Relative: 11 %
Lymphs Abs: 1.1 10*3/uL (ref 0.7–4.0)
MCH: 31.1 pg (ref 26.0–34.0)
MCHC: 34 g/dL (ref 30.0–36.0)
MCV: 91.4 fL (ref 80.0–100.0)
Monocytes Absolute: 0.7 10*3/uL (ref 0.1–1.0)
Monocytes Relative: 7 %
Neutro Abs: 7.9 10*3/uL — ABNORMAL HIGH (ref 1.7–7.7)
Neutrophils Relative %: 82 %
Platelets: 313 10*3/uL (ref 150–400)
RBC: 4.08 MIL/uL (ref 3.87–5.11)
RDW: 13.2 % (ref 11.5–15.5)
WBC: 9.7 10*3/uL (ref 4.0–10.5)
nRBC: 0 % (ref 0.0–0.2)

## 2020-08-11 LAB — COMPREHENSIVE METABOLIC PANEL
ALT: 100 U/L — ABNORMAL HIGH (ref 0–44)
AST: 80 U/L — ABNORMAL HIGH (ref 15–41)
Albumin: 3.7 g/dL (ref 3.5–5.0)
Alkaline Phosphatase: 67 U/L (ref 38–126)
Anion gap: 9 (ref 5–15)
BUN: 11 mg/dL (ref 6–20)
CO2: 24 mmol/L (ref 22–32)
Calcium: 9.4 mg/dL (ref 8.9–10.3)
Chloride: 106 mmol/L (ref 98–111)
Creatinine, Ser: 0.93 mg/dL (ref 0.44–1.00)
GFR, Estimated: >60 mL/min (ref >60)
Glucose, Bld: 117 mg/dL — ABNORMAL HIGH (ref 70–99)
Potassium: 4.3 mmol/L (ref 3.5–5.1)
Sodium: 139 mmol/L (ref 135–145)
Total Bilirubin: 0.5 mg/dL (ref 0.3–1.2)
Total Protein: 6.7 g/dL (ref 6.5–8.1)

## 2020-08-11 MED ORDER — DEXTROSE 50 % IV SOLN
12.5000 g | INTRAVENOUS | Status: AC
  Administered 2020-08-11: 12.5 g via INTRAVENOUS

## 2020-08-11 MED ORDER — DEXTROSE 50 % IV SOLN
INTRAVENOUS | Status: AC
  Administered 2020-08-11: 1 mL
  Filled 2020-08-11: qty 50

## 2020-08-11 MED ORDER — DEXTROSE 50 % IV SOLN
INTRAVENOUS | Status: AC
  Filled 2020-08-11: qty 50

## 2020-08-11 MED ORDER — DEXTROSE-NACL 5-0.45 % IV SOLN: INTRAVENOUS | Status: DC

## 2020-08-11 MED ORDER — INSULIN ASPART 100 UNIT/ML ~~LOC~~ SOLN: 0.0000 [IU] | SUBCUTANEOUS | Status: DC

## 2020-08-11 MED ORDER — ONDANSETRON 4 MG PO TBDP
4.0000 mg | ORAL_TABLET | Freq: Four times a day (QID) | ORAL | 0 refills | Status: DC | PRN
  Filled 2020-08-11: qty 20, 5d supply, fill #0

## 2020-08-11 MED ORDER — METOPROLOL TARTRATE 25 MG PO TABS
25.0000 mg | ORAL_TABLET | Freq: Two times a day (BID) | ORAL | 1 refills | Status: DC
  Filled 2020-08-11: qty 60, 30d supply, fill #0
  Filled 2020-09-12: qty 60, 30d supply, fill #1

## 2020-08-11 MED ORDER — ACETAMINOPHEN 500 MG PO TABS: 1000.0000 mg | ORAL_TABLET | Freq: Three times a day (TID) | ORAL | 0 refills | Status: DC

## 2020-08-11 MED ORDER — DEXTROSE 50 % IV SOLN
INTRAVENOUS | Status: AC
  Administered 2020-08-11: 50 mL
  Filled 2020-08-11: qty 50

## 2020-08-11 MED ORDER — PANTOPRAZOLE SODIUM 40 MG PO TBEC
40.0000 mg | DELAYED_RELEASE_TABLET | Freq: Every day | ORAL | 0 refills | Status: DC
  Filled 2020-08-11: qty 90, 90d supply, fill #0

## 2020-08-11 MED ORDER — GLUCOSE 4 G PO CHEW
CHEWABLE_TABLET | ORAL | Status: AC
  Filled 2020-08-11: qty 1

## 2020-08-11 NOTE — Progress Notes (Signed)
Hypoglycemic Event  CBG: 45  Treatment:D50 25g Symptoms: Facial flushing   Follow-up CBG: Time:1521 CBG Result:98  Possible Reasons for Event: Bari, sensitive to insulin admin  Comments/MD notified:Wilson notified by Moses Manners

## 2020-08-11 NOTE — Progress Notes (Signed)
Patient alert and oriented, pain is controlled. Patient is tolerating fluids, advanced to protein shake today, patient is tolerating well. Reviewed Gastric Bypass discharge instructions with patient and patient is able to articulate understanding. Provided information on BELT program, Support Group and [facility] outpatient pharmacy. All questions answered, will continue to monitor.    

## 2020-08-11 NOTE — Progress Notes (Signed)
1 Day Post-Op   Subjective/Chief Complaint: Pt seen and examined earlier today Already finished water goals from yesterday and had 1entire  protein shake last night starting 2nd shake now No nausea Min to no pain Walked Used cpap   Objective: Vital signs in last 24 hours: Temp:  [97.7 F (36.5 C)-98.8 F (37.1 C)] 98.4 F (36.9 C) (04/20 2014) Pulse Rate:  [66-88] 69 (04/20 2014) Resp:  [16] 16 (04/20 2014) BP: (109-140)/(67-86) 126/78 (04/20 2014) SpO2:  [92 %-100 %] 92 % (04/20 2014) Last BM Date: 08/09/20  Intake/Output from previous day: 04/19 0701 - 04/20 0700 In: 4527.8 [P.O.:1140; I.V.:3274.5; IV Piggyback:113.3] Out: 3850 [Urine:3850] Intake/Output this shift: No intake/output data recorded.  Alert, sitting on BS window seat symm chest rise, clear Reg Soft, min approp TTP, incisions ok No edema  Lab Results:  Recent Labs    08/10/20 1431 08/11/20 0424  WBC  --  9.7  HGB 13.4 12.7  HCT 40.3 37.3  PLT  --  313   BMET Recent Labs    08/11/20 0424  NA 139  K 4.3  CL 106  CO2 24  GLUCOSE 117*  BUN 11  CREATININE 0.93  CALCIUM 9.4   PT/INR No results for input(s): LABPROT, INR in the last 72 hours. ABG No results for input(s): PHART, HCO3 in the last 72 hours.  Invalid input(s): PCO2, PO2  Studies/Results: No results found.  Anti-infectives: Anti-infectives (From admission, onward)   Start     Dose/Rate Route Frequency Ordered Stop   08/10/20 0600  gentamicin (GARAMYCIN) 110 mg, clindamycin (CLEOCIN) 900 mg in dextrose 5 % 100 mL IVPB        1.5 mg/kg  76.4 kg (Adjusted) 217.5 mL/hr over 30 Minutes Intravenous On call to O.R. 08/09/20 0708 08/10/20 1709      Assessment/Plan: s/p Procedure(s): LAPAROSCOPIC ROUX-EN-Y GASTRIC BYPASS WITH UPPER ENDOSCOPY (N/A) UPPER GI ENDOSCOPY (N/A) LAPAROSCOPIC PRIMARY REPAIR OF INCISIONAL HERNIA  No fever. No wbc. Tolerating oral intake No clinical signs leak Cont chemical vte prophylaxis Plan  will be dc later today I spoke with pharm and they removed a lot of duplicate/triplicate meds on her MAR Sent her PCP a staff message  UPDATE- pt had 2 episodes of hypoglycemia requiring treatment. Since BS still requiring treatment at 3pm - think it is best to monitor pt overnight to make these hypoglycemia events are not ongoing.   Will change to sensitive scale.   Her discharge meds and news meds have been sent in already.   I will not be available tomorrow Dr Kae Heller has agreed to see the pt in the AM for me  LOS: 1 day    [name] 08/11/2020

## 2020-08-11 NOTE — Progress Notes (Signed)
Patient alert and oriented, Post op day 1.  Provided support and encouragement.  Encouraged pulmonary toilet, ambulation and small sips of liquids.  All questions answered.  Will continue to monitor. 

## 2020-08-11 NOTE — Progress Notes (Signed)
Nutrition Education Note  Received consult for diet education for patient s/p bariatric surgery. She is POD #1 Roux-en-Y gastric bypass.  Patient sitting on the edge of bed and reports feeling good overall today. She has purchased several protein options for at home (protein shakes, protein powder, and protein water) and has purchased her vitamin and mineral supplements.   Discussed 2 week post op diet with pt. Emphasized that liquids must be non carbonated, non caffeinated, and sugar free. Fluid goals discussed. Pt to follow up with outpatient bariatric RD for further diet progression after 2 weeks. Multivitamins and minerals also reviewed. Teach back method used, pt expressed understanding, expect good compliance.  If nutrition issues arise, please consult RD.     Jarome Matin, MS, RD, LDN, CNSC Inpatient Clinical Dietitian RD pager # available in [place]  After hours/weekend pager # available in [facility]

## 2020-08-11 NOTE — Progress Notes (Signed)
Hypoglycemic Event  CBG: 59  Treatment: D50 12.5 g  Symptoms: Diaphoretic   Follow-up CBG: Time: 1214 CBG Result: 77  Possible Reasons for Event: Bari, sensitive to insulin admin Comments/MD notified:Wilson notified by Reatha Harps

## 2020-08-11 NOTE — Progress Notes (Signed)
Inpatient Diabetes Program Recommendations  AACE/ADA: New Consensus Statement on Inpatient Glycemic Control (2015)  Target Ranges:  Prepandial:   less than 140 mg/dL      Peak postprandial:   less than 180 mg/dL (1-2 hours)      Critically ill patients:  140 - 180 mg/dL   Lab Results  Component Value Date   GLUCAP 59 (L) 08/11/2020   HGBA1C 5.9 (H) 08/03/2020    Review of Glycemic Control Results for [name], [name] (MRN [id number]) as of 08/11/2020 11:46  Ref. Range 08/11/2020 00:09 08/11/2020 04:12 08/11/2020 07:33 08/11/2020 11:31  Glucose-Capillary Latest Ref Range: 70 - 99 mg/dL 94 106 (H) 84 59 (L)   Diabetes history: DM 2-S/P bariatric surgery Outpatient Diabetes medications:  Metformin 500 mg daily Current orders for Inpatient glycemic control:  Novolog resistant q 4 hours Inpatient Diabetes Program Recommendations:   Please reduce Novolog correction to sensitive q 4 hours.  Thanks Adah Perl, RN, BC-ADM Inpatient Diabetes Coordinator Pager [phone] (8a-5p)

## 2020-08-11 NOTE — Progress Notes (Signed)
Called and discussed updates with Dr. [name].  Pt's discharged canceled.  SSI changed to sensitive scale. Will continue to monitor.

## 2020-08-12 LAB — GLUCOSE, CAPILLARY
Glucose-Capillary: 104 mg/dL — ABNORMAL HIGH (ref 70–99)
Glucose-Capillary: 105 mg/dL — ABNORMAL HIGH (ref 70–99)
Glucose-Capillary: 136 mg/dL — ABNORMAL HIGH (ref 70–99)
Glucose-Capillary: 30 mg/dL — CL (ref 70–99)
Glucose-Capillary: 49 mg/dL — ABNORMAL LOW (ref 70–99)
Glucose-Capillary: 49 mg/dL — ABNORMAL LOW (ref 70–99)
Glucose-Capillary: 49 mg/dL — ABNORMAL LOW (ref 70–99)
Glucose-Capillary: 56 mg/dL — ABNORMAL LOW (ref 70–99)
Glucose-Capillary: 62 mg/dL — ABNORMAL LOW (ref 70–99)
Glucose-Capillary: 67 mg/dL — ABNORMAL LOW (ref 70–99)
Glucose-Capillary: 77 mg/dL (ref 70–99)
Glucose-Capillary: 86 mg/dL (ref 70–99)
Glucose-Capillary: 89 mg/dL (ref 70–99)
Glucose-Capillary: 93 mg/dL (ref 70–99)

## 2020-08-12 LAB — CBC WITH DIFFERENTIAL/PLATELET
Abs Immature Granulocytes: 0.04 10*3/uL (ref 0.00–0.07)
Basophils Absolute: 0 10*3/uL (ref 0.0–0.1)
Basophils Relative: 0 %
Eosinophils Absolute: 0 10*3/uL (ref 0.0–0.5)
Eosinophils Relative: 0 %
HCT: 40.2 % (ref 36.0–46.0)
Hemoglobin: 13.2 g/dL (ref 12.0–15.0)
Immature Granulocytes: 0 %
Lymphocytes Relative: 21 %
Lymphs Abs: 2.1 10*3/uL (ref 0.7–4.0)
MCH: 30.9 pg (ref 26.0–34.0)
MCHC: 32.8 g/dL (ref 30.0–36.0)
MCV: 94.1 fL (ref 80.0–100.0)
Monocytes Absolute: 0.7 10*3/uL (ref 0.1–1.0)
Monocytes Relative: 7 %
Neutro Abs: 7.1 10*3/uL (ref 1.7–7.7)
Neutrophils Relative %: 72 %
Platelets: 335 10*3/uL (ref 150–400)
RBC: 4.27 MIL/uL (ref 3.87–5.11)
RDW: 13.3 % (ref 11.5–15.5)
WBC: 10 10*3/uL (ref 4.0–10.5)
nRBC: 0 % (ref 0.0–0.2)

## 2020-08-12 LAB — GLUCOSE, RANDOM
Glucose, Bld: 90 mg/dL (ref 70–99)
Glucose, Bld: 97 mg/dL (ref 70–99)

## 2020-08-12 MED ORDER — DEXTROSE-NACL 5-0.45 % IV SOLN: INTRAVENOUS | Status: DC

## 2020-08-12 MED ORDER — DEXTROSE 50 % IV SOLN
12.5000 g | Freq: Once | INTRAVENOUS | Status: AC
  Administered 2020-08-12: 12.5 g via INTRAVENOUS

## 2020-08-12 MED ORDER — KCL IN DEXTROSE-NACL 20-5-0.45 MEQ/L-%-% IV SOLN: INTRAVENOUS | Status: DC

## 2020-08-12 MED ORDER — DEXTROSE 50 % IV SOLN
INTRAVENOUS | Status: AC
  Administered 2020-08-12: 25 mL
  Filled 2020-08-12: qty 50

## 2020-08-12 MED ORDER — DEXTROSE 50 % IV SOLN
1.0000 | Freq: Once | INTRAVENOUS | Status: AC
  Administered 2020-08-12: 50 mL via INTRAVENOUS

## 2020-08-12 MED ORDER — DEXTROSE 50 % IV SOLN
INTRAVENOUS | Status: AC
  Filled 2020-08-12: qty 50

## 2020-08-12 NOTE — Progress Notes (Signed)
Hypoglycemic Event  CBG: 37  Treatment:D50 25g Symptoms: Facial flushing   Follow-up CBG: Time:2138    CBG Result:130  Possible Reasons for Event: Judie Petit,  Comments/MD notified:On call Stechschulte notified via page. See new orders     [facility]

## 2020-08-12 NOTE — Progress Notes (Signed)
Hypoglycemic Event  CBG: 30  Treatment: Ampule of D50   Symptoms: fatigue   Follow-up CBG: Time:1200 CBG Result:56  Possible Reasons for Event: unknown   Comments/MD notified, MD notified  fluids reordered    Jennye Boroughs

## 2020-08-12 NOTE — Progress Notes (Signed)
NUTRITION NOTE  Consult received for diet education and secure message received that patient has been experiencing persistent hypoglycemic events. Patient was on metformin PTA, not receiving any DM-related medications at this time.   She is on bariatric clears diet order.   Defer decisions concerning items patient is permitted to consume to Surgeon. Usual protocol is for sugar-free or very low carb-containing liquids for the first 2 weeks post-op.   Defer to Surgeon on if sugar-containing items (such as non-sugar free popsicles, jello, pudding, etc) will be permitted for this patient to avoid hypoglycemic events.  If additional nutrition-related needs arise, please re-consult RD.      Jarome Matin, MS, RD, LDN, CNSC Inpatient Clinical Dietitian RD pager # available in [place]  After hours/weekend pager # available in [facility]

## 2020-08-12 NOTE — Progress Notes (Signed)
Hypoglycemic Event  CBG: 56  Treatment: 1/2 ampule of D50   Symptoms: Fatigue  Follow-up CBG: [id number] CBG Result:104  Possible Reasons for Event: unknown  Comments/MD notified MD notified    Jennye Boroughs

## 2020-08-12 NOTE — Progress Notes (Signed)
Inpatient Diabetes Program Recommendations  AACE/ADA: New Consensus Statement on Inpatient Glycemic Control (2015)  Target Ranges:  Prepandial:   less than 140 mg/dL      Peak postprandial:   less than 180 mg/dL (1-2 hours)      Critically ill patients:  140 - 180 mg/dL   Lab Results  Component Value Date   GLUCAP 56 (L) 08/12/2020   HGBA1C 5.9 (H) 08/03/2020    Review of Glycemic Control Results for KATRIA, BOTTS (MRN [id number]) as of 08/12/2020 12:02  Ref. Range 08/12/2020 07:39 08/12/2020 11:28 08/12/2020 11:57  Glucose-Capillary Latest Ref Range: 70 - 99 mg/dL 89 30 (LL) 56 (L)   Diabetes history: DM 2 Outpatient Diabetes medications:  Metformin 500 mg daily Current orders for Inpatient glycemic control:  None  Inpatient Diabetes Program Recommendations:    Note hypoglycemia. Unsure of cause since metformin should not induce hypoglycemia.  Will need to monitor closely. Will not need metformin at d/c.    Thanks, Adah Perl, RN, BC-ADM Inpatient Diabetes Coordinator Pager [phone] (8a-5p)

## 2020-08-12 NOTE — Progress Notes (Signed)
2 Days Post-Op   Subjective/Chief Complaint: Continued issues with hypoglycemia with blood glucose of 37 overnight.  She has had issues with hypoglycemia in the past, and takes sugar pills at home for symptoms of hypoglycemia.  Her only home diabetes med is metformin.     Objective: Vital signs in last 24 hours: Temp:  [98 F (36.7 C)-98.8 F (37.1 C)] 98.4 F (36.9 C) (04/21 0419) Pulse Rate:  [66-73] 67 (04/21 0419) Resp:  [16-17] 16 (04/21 0419) BP: (122-141)/(65-86) 141/81 (04/21 0419) SpO2:  [92 %-100 %] 93 % (04/21 0419) Last BM Date: 08/09/20  Intake/Output from previous day: 04/20 0701 - 04/21 0700 In: 1734.1 [P.O.:480; I.V.:1254.1] Out: 2450 [Urine:2450] Intake/Output this shift: No intake/output data recorded.  Alert, sitting at bedside symm chest rise, clear Reg Soft, min approp TTP, incisions ok No edema  Lab Results:  Recent Labs    08/11/20 0424 08/12/20 0437  WBC 9.7 10.0  HGB 12.7 13.2  HCT 37.3 40.2  PLT 313 335   BMET Recent Labs    08/11/20 0424  NA 139  K 4.3  CL 106  CO2 24  GLUCOSE 117*  BUN 11  CREATININE 0.93  CALCIUM 9.4   PT/INR No results for input(s): LABPROT, INR in the last 72 hours. ABG No results for input(s): PHART, HCO3 in the last 72 hours.  Invalid input(s): PCO2, PO2  Studies/Results: No results found.  Anti-infectives: Anti-infectives (From admission, onward)   Start     Dose/Rate Route Frequency Ordered Stop   08/10/20 0600  gentamicin (GARAMYCIN) 110 mg, clindamycin (CLEOCIN) 900 mg in dextrose 5 % 100 mL IVPB        1.5 mg/kg  76.4 kg (Adjusted) 217.5 mL/hr over 30 Minutes Intravenous On call to O.R. 08/09/20 0708 08/10/20 1709      Assessment/Plan: s/p Procedure(s): LAPAROSCOPIC ROUX-EN-Y GASTRIC BYPASS WITH UPPER ENDOSCOPY (N/A) UPPER GI ENDOSCOPY (N/A) LAPAROSCOPIC PRIMARY REPAIR OF INCISIONAL HERNIA  No fever. No wbc. Tolerating oral intake No clinical signs leak Cont chemical vte  prophylaxis  Continued hypoglycemic episodes overnight Discontinue insulin orders (hasn't been getting any anyway), continue CBG monitoring Started on d5 1/2 ns overnight Stop IVF this AM Bariatric liquid diet, do not give sugary drinks for hypoglycemic episodes as dumping syndrome could worsen the situation.  Instructed patient to drink skim milk or protein shake for hypoglycemic episodes at home  Continue to monitor this morning off the D5 to see if glucose stabilizes for possible discharge later today.  Her discharge meds and news meds have been sent in already.     I am covering as Dr. [name] is out today.    LOS: 2 days    Nickola Major Llewellyn Choplin 08/12/2020

## 2020-08-12 NOTE — Progress Notes (Signed)
Hypoglycemic Event  CBG: 49 (at [place])  Treatment: 12.5 g D50% IV  Symptoms: asymptomatic  Follow-up CBG: Time:2023 CBG Result:105  Possible Reasons for Event: bari diet  Comments/MD notified: on call Toth. See new orders    Kai Levins

## 2020-08-12 NOTE — Progress Notes (Addendum)
Hypoglycemic Event  CBG: 62  Treatment: 1 ampule of D50  Symptoms: Patient vomited  Follow-up CBG: Time 2235 CBG Result:86  Possible Reasons for Event: [name] Diet  Comments/MD notified: MD [name] notified by Gina Fields, RN    [facility]  [name]

## 2020-08-12 NOTE — Progress Notes (Addendum)
Patients blood glucose checked at 1551 and glucometer reading low patient given apple juice per MD Stechschulte and 1/2 an ampule of D50. Blood glucose rechecked at 1600 and was 49. Spoke with bariatric coordinator told to recheck blood glucose in 30 minutes and she would notify MD. Damaris Schooner with rapid RN stat serum glucose ordered.

## 2020-08-13 ENCOUNTER — Encounter: Payer: Self-pay | Admitting: Family Medicine

## 2020-08-13 LAB — GLUCOSE, CAPILLARY
Glucose-Capillary: 107 mg/dL — ABNORMAL HIGH (ref 70–99)
Glucose-Capillary: 171 mg/dL — ABNORMAL HIGH (ref 70–99)
Glucose-Capillary: 55 mg/dL — ABNORMAL LOW (ref 70–99)
Glucose-Capillary: 63 mg/dL — ABNORMAL LOW (ref 70–99)
Glucose-Capillary: 66 mg/dL — ABNORMAL LOW (ref 70–99)
Glucose-Capillary: 71 mg/dL (ref 70–99)
Glucose-Capillary: 80 mg/dL (ref 70–99)
Glucose-Capillary: 80 mg/dL (ref 70–99)
Glucose-Capillary: 81 mg/dL (ref 70–99)
Glucose-Capillary: <10 mg/dL — CL (ref 70–99)

## 2020-08-13 MED ORDER — DEXTROSE 50 % IV SOLN
1.0000 | Freq: Once | INTRAVENOUS | Status: AC
  Administered 2020-08-13: 50 mL via INTRAVENOUS

## 2020-08-13 MED ORDER — GLUCOSE 4 G PO CHEW
1.0000 | CHEWABLE_TABLET | Freq: Once | ORAL | Status: AC | PRN
  Administered 2020-08-13: 4 g via ORAL

## 2020-08-13 MED ORDER — GLUCOSE 4 G PO CHEW
CHEWABLE_TABLET | ORAL | Status: AC
  Filled 2020-08-13: qty 1

## 2020-08-13 MED ORDER — LACTATED RINGERS IV SOLN: INTRAVENOUS | Status: DC

## 2020-08-13 MED ORDER — DOCUSATE SODIUM 100 MG PO CAPS
100.0000 mg | ORAL_CAPSULE | Freq: Two times a day (BID) | ORAL | Status: DC
  Administered 2020-08-13 – 2020-08-16 (×4): 100 mg via ORAL
  Filled 2020-08-13 (×6): qty 1

## 2020-08-13 MED ORDER — BISACODYL 10 MG RE SUPP: 10.0000 mg | Freq: Every day | RECTAL | Status: DC | PRN

## 2020-08-13 MED ORDER — DEXTROSE 50 % IV SOLN
INTRAVENOUS | Status: AC
  Filled 2020-08-13: qty 50

## 2020-08-13 NOTE — Progress Notes (Addendum)
3 Days Post-Op   Subjective/Chief Complaint: Continued issues with hypoglycemia, has gotten D50 a few times. She is now on D5 and has been drinking g2 and apple juice, but actually has not consumed ANY protein in the last 2 days.   Low grade temp and HR up a bit this AM. Emesis yesterday with apple juice.   Objective: Vital signs in last 24 hours: Temp:  [97.5 F (36.4 C)-100.4 F (38 C)] 100.4 F (38 C) (04/22 0403) Pulse Rate:  [66-107] 107 (04/22 0403) Resp:  [17-18] 17 (04/22 0403) BP: (123-141)/(64-95) 123/69 (04/22 0403) SpO2:  [95 %-100 %] 96 % (04/22 0403) Last BM Date: 08/09/20  Intake/Output from previous day: 04/21 0701 - 04/22 0700 In: 1239.5 [P.O.:330; I.V.:909.5] Out: 1540 [Urine:1450; Emesis/NG output:2] Intake/Output this shift: No intake/output data recorded.  Alert, comfortable symm chest rise, clear Reg Soft, nontender, incisions c/d/i no cellulitis or hematoma No edema  Lab Results:  Recent Labs    08/11/20 0424 08/12/20 0437  WBC 9.7 10.0  HGB 12.7 13.2  HCT 37.3 40.2  PLT 313 335   BMET Recent Labs    08/11/20 0424 08/12/20 1658 08/12/20 1831  NA 139  --   --   K 4.3  --   --   CL 106  --   --   CO2 24  --   --   GLUCOSE 117* 90 97  BUN 11  --   --   CREATININE 0.93  --   --   CALCIUM 9.4  --   --    PT/INR No results for input(s): LABPROT, INR in the last 72 hours. ABG No results for input(s): PHART, HCO3 in the last 72 hours.  Invalid input(s): PCO2, PO2  Studies/Results: No results found.  Anti-infectives: Anti-infectives (From admission, onward)   Start     Dose/Rate Route Frequency Ordered Stop   08/10/20 0600  gentamicin (GARAMYCIN) 110 mg, clindamycin (CLEOCIN) 900 mg in dextrose 5 % 100 mL IVPB        1.5 mg/kg  76.4 kg (Adjusted) 217.5 mL/hr over 30 Minutes Intravenous On call to O.R. 08/09/20 0708 08/10/20 1709      Assessment/Plan: s/p Procedure(s): LAPAROSCOPIC ROUX-EN-Y GASTRIC BYPASS WITH UPPER  ENDOSCOPY (N/A) UPPER GI ENDOSCOPY (N/A) LAPAROSCOPIC PRIMARY REPAIR OF INCISIONAL HERNIA    Continued hypoglycemic episodes overnight  She is on D5 and drinking juice, has not consumed any of the protein shakes in 2 days. I recommend she focus on consuming more of the protein and avoid the sugary drinks as this is likely worsening things. Will continue to monitor. Stop D5 and start NS as she is not taking much PO. Stop gabapentin in the off chance this is contributing. Will d/w Dr. [name].      LOS: 3 days    Clovis Riley 08/13/2020

## 2020-08-13 NOTE — Progress Notes (Signed)
Hypoglycemic Event  CBG: 55  Treatment: glucose tablet   Symptoms: asymptomatic  Follow-up CBG: [id number] CBG Result: 80  Possible Reasons for Event: unknown  Comments/MD notified:MD aware     Jennye Boroughs

## 2020-08-13 NOTE — Progress Notes (Addendum)
Hypoglycemic Event  CBG: 67  Treatment: 1 amp D50% IV  Symptoms: fatigue, chills  Follow-up CBG: Time:0030 CBG Result: 171  Possible Reasons for Event: bari diet?  Comments/MD notified: Toth via phone call. No new orders    Kai Levins

## 2020-08-13 NOTE — Progress Notes (Signed)
Hypoglycemic Event  CBG:63  Treatment: Juice and glucose tablet  Symptoms: asymptomatic   Follow-up CBG: Time 1201 CBG Result: 71  Possible Reasons for Event: unknown   Comments/MD notified: Spoke with Bariatric coordinator and CCS who will notify Dr. Kae Heller of hypoglycemic event.     [name]

## 2020-08-14 LAB — CBC
HCT: 36 % (ref 36.0–46.0)
Hemoglobin: 11.8 g/dL — ABNORMAL LOW (ref 12.0–15.0)
MCH: 31.3 pg (ref 26.0–34.0)
MCHC: 32.8 g/dL (ref 30.0–36.0)
MCV: 95.5 fL (ref 80.0–100.0)
Platelets: 275 10*3/uL (ref 150–400)
RBC: 3.77 MIL/uL — ABNORMAL LOW (ref 3.87–5.11)
RDW: 13.2 % (ref 11.5–15.5)
WBC: 5.6 10*3/uL (ref 4.0–10.5)
nRBC: 0 % (ref 0.0–0.2)

## 2020-08-14 LAB — BASIC METABOLIC PANEL
Anion gap: 11 (ref 5–15)
BUN: 11 mg/dL (ref 6–20)
CO2: 21 mmol/L — ABNORMAL LOW (ref 22–32)
Calcium: 8.8 mg/dL — ABNORMAL LOW (ref 8.9–10.3)
Chloride: 104 mmol/L (ref 98–111)
Creatinine, Ser: 0.84 mg/dL (ref 0.44–1.00)
GFR, Estimated: >60 mL/min (ref >60)
Glucose, Bld: 91 mg/dL (ref 70–99)
Potassium: 3.7 mmol/L (ref 3.5–5.1)
Sodium: 136 mmol/L (ref 135–145)

## 2020-08-14 LAB — GLUCOSE, CAPILLARY
Glucose-Capillary: 48 mg/dL — ABNORMAL LOW (ref 70–99)
Glucose-Capillary: 52 mg/dL — ABNORMAL LOW (ref 70–99)
Glucose-Capillary: 57 mg/dL — ABNORMAL LOW (ref 70–99)
Glucose-Capillary: 61 mg/dL — ABNORMAL LOW (ref 70–99)
Glucose-Capillary: 75 mg/dL (ref 70–99)
Glucose-Capillary: 75 mg/dL (ref 70–99)
Glucose-Capillary: 82 mg/dL (ref 70–99)
Glucose-Capillary: 85 mg/dL (ref 70–99)
Glucose-Capillary: 98 mg/dL (ref 70–99)

## 2020-08-14 LAB — MAGNESIUM: Magnesium: 1.6 mg/dL — ABNORMAL LOW (ref 1.7–2.4)

## 2020-08-14 LAB — GLUCOSE, RANDOM: Glucose, Bld: 92 mg/dL (ref 70–99)

## 2020-08-14 MED ORDER — MAGNESIUM SULFATE 2 GM/50ML IV SOLN
2.0000 g | Freq: Once | INTRAVENOUS | Status: AC
  Administered 2020-08-14: 2 g via INTRAVENOUS
  Filled 2020-08-14: qty 50

## 2020-08-14 MED ORDER — GLUCOSE 4 G PO CHEW
1.0000 | CHEWABLE_TABLET | Freq: Once | ORAL | Status: AC | PRN
  Administered 2020-08-15: 4 g via ORAL

## 2020-08-14 NOTE — Progress Notes (Signed)
Hypoglycemic Event  CBG: 48  Treatment: Glucose tablet, sports drink  Symptoms: Asymptomatic   Follow-up CBG: Time: 0430 CBG Result:85  Possible Reasons for Event: Unknown  Comments/MD notified:    Izak Anding  Tamala Julian

## 2020-08-14 NOTE — Progress Notes (Signed)
Hypoglycemic Event  CBG: 66  Treatment: Glucose tablet  Symptoms: tired  Follow-up CBG: Time: 0020 CBG Result: 98  Possible Reasons for Event: Unknown  Comments/MD notified:    Jye Fariss  Tamala Julian

## 2020-08-14 NOTE — Progress Notes (Signed)
CBG this afternoon at 1630 = 61. Pt asymptomatic, enc pt to take another 2 oz of protein drink while awaiting stat lab to confirm cbg. Lab at 1700 = 92 . Marland Kitchen Pt stated that she preferred to drink her water, but strongly encouraged pt to take in her calories (protein drink, protein water) so that her cbg's will stabilize. Verb. Understanding.

## 2020-08-14 NOTE — Progress Notes (Signed)
4 Days Post-Op   Subjective/Chief Complaint: Hypoglycemic to 48 early this morning. No tachycardia or fevers. Is consuming some of her protein shakes but reports an episode of emesis yesterday.  Objective: Vital signs in last 24 hours: Temp:  [98.1 F (36.7 C)-99 F (37.2 C)] 98.5 F (36.9 C) (04/23 0931) Pulse Rate:  [68-94] 79 (04/23 0931) Resp:  [15-18] 17 (04/23 0931) BP: (108-158)/(56-90) 112/73 (04/23 0931) SpO2:  [79 %-100 %] 85 % (04/23 0931) Last BM Date: 08/14/20  Intake/Output from previous day: 04/22 0701 - 04/23 0700 In: 2113.1 [P.O.:420; I.V.:1693.1] Out: 2200 [Urine:2200] Intake/Output this shift: Total I/O In: 547 [P.O.:110; I.V.:437] Out: -   Alert, comfortable symm chest rise, clear Soft, nontender, incisions c/d/i no cellulitis or hematoma No edema  Lab Results:  Recent Labs    08/12/20 0437 08/14/20 0413  WBC 10.0 5.6  HGB 13.2 11.8*  HCT 40.2 36.0  PLT 335 275   BMET Recent Labs    08/12/20 1831 08/14/20 0413  NA  --  136  K  --  3.7  CL  --  104  CO2  --  21*  GLUCOSE 97 91  BUN  --  11  CREATININE  --  0.84  CALCIUM  --  8.8*   PT/INR No results for input(s): LABPROT, INR in the last 72 hours. ABG No results for input(s): PHART, HCO3 in the last 72 hours.  Invalid input(s): PCO2, PO2  Studies/Results: No results found.  Anti-infectives: Anti-infectives (From admission, onward)   Start     Dose/Rate Route Frequency Ordered Stop   08/10/20 0600  gentamicin (GARAMYCIN) 110 mg, clindamycin (CLEOCIN) 900 mg in dextrose 5 % 100 mL IVPB        1.5 mg/kg  76.4 kg (Adjusted) 217.5 mL/hr over 30 Minutes Intravenous On call to O.R. 08/09/20 0708 08/10/20 1709      Assessment/Plan: s/p Procedure(s): LAPAROSCOPIC ROUX-EN-Y GASTRIC BYPASS WITH UPPER ENDOSCOPY (N/A) UPPER GI ENDOSCOPY (N/A) LAPAROSCOPIC PRIMARY REPAIR OF INCISIONAL HERNIA    Continued hypoglycemic episodes overnight Still having some nausea, but PO intake is  slowly improving. Patient was again encouraged to consume protein shakes, which she has at the bedside this morning. Continue IV fluid hydration    LOS: 4 days   Michaelle Birks, MD [facility] Surgery General, Hepatobiliary and Pancreatic Surgery 08/14/20 10:54 AM

## 2020-08-14 NOTE — Progress Notes (Signed)
PHARMACY CONSULT FOR:  Risk Assessment for Post-Discharge VTE Following Bariatric Surgery  Post-Discharge VTE Risk Assessment: This patient's probability of 30-day post-discharge VTE is increased due to the factors marked:   Female    Age >/=60 years    BMI >/=50 kg/m2    CHF    Dyspnea at Rest    Paraplegia  x  Non-gastric-band surgery    Operation Time >/=3 hr    Return to OR   x  Length of Stay >/= 3 d   Hx of VTE   Hypercoagulable condition   Significant venous stasis    Predicted probability of 30-day post-discharge VTE: 0.25%  Recommendation for Discharge: No pharmacologic prophylaxis post-discharge despite increased LOS >/= 3 days   [name] is a 60 y.o. 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 Crestor [Rosuvastatin] Other (See Comments)    Myalgias and rising CPK  . Losartan Cough  . Wellbutrin [Bupropion] Other (See Comments)    Dizziness and mental status changes  . Atorvastatin     myalgias  . Amoxicillin Rash  . Aspirin Nausea Only and Other (See Comments)    GI upset REACTION: Upset stomach  . Codeine Rash  . Erythromycin Rash and Other (See Comments)  . Penicillins Rash    Reaction: 15 years    Patient Measurements: Height: 5\' 4"  (162.6 cm) Weight: 108.6 kg (239 lb 6.4 oz) IBW/kg (Calculated) : 54.7 Body mass index is 41.09 kg/m.  Recent Labs    08/12/20 0437 08/14/20 0413  WBC 10.0 5.6  HGB 13.2 11.8*  HCT 40.2 36.0  PLT 335 275  CREATININE  --  0.84  MG  --  1.6*   Estimated Creatinine Clearance: 86.9 mL/min (by C-G formula based on SCr of 0.84 mg/dL).    Past Medical History:  Diagnosis Date  . Acute bronchitis 05/25/2016  . Anxiety   . Arthritis   . Chronic pain syndrome 01/06/2013  . Chronic rhinosinusitis   . Colon polyps   . Cough 01/06/2013  . Depression   . Diabetes ([place]) 01/06/2013  . Diabetes mellitus type 2 in  obese [facility]) 01/06/2013   Sees Dr Calvert Cantor for eye exam Does not see podiatry, foot exam today unremarkable except for thick cracking skin on heals  pt denies   . Dyspnea    with exertion   . Dysuria 05/25/2016  . Edema 01/06/2013  . Epistaxis 05/25/2016  . Fibroid, uterine   . Fibromyalgia   . GERD (gastroesophageal reflux disease)   . Glaucoma 12-13  . Hoarseness   . Hoarseness of voice 05/11/2013  . Hyperglycemia 04/13/2013  . Hyperlipemia, mixed 06/06/2007   Qualifier: Diagnosis of  By: Wynona Luna She feels Lipitor caused increased low back pain and weakness    . Hyperlipidemia   . Hypertension   . Hypokalemia 02/05/2013  . IBS (irritable bowel syndrome) 02/13/2011  . Low back pain 03/03/2013  . Muscle cramp 05/22/2014  . Obesity   . Obesity, unspecified 05/11/2013  . OSA (obstructive sleep apnea)    cpap   . Pain in joint, shoulder region 06/27/2015  . Perimenopause 10/05/2012  . Pre-diabetes   . Raynaud disease 06/16/2013  . Thyroid disease    hypothyroidism  . Vocal fold nodules      Medications Prior to Admission  Medication Sig Dispense Refill Last Dose  . albuterol (VENTOLIN  HFA) 108 (90 Base) MCG/ACT inhaler INHALE 2 PUFFS INTO THE LUNGS EVERY 4 (FOUR) HOURS AS NEEDED FOR WHEEZING OR SHORTNESS OF BREATH. 18 g 5 Past Week at Unknown time  . aspirin EC 81 MG tablet Take 1 tablet (81 mg total) by mouth daily.   08/07/2020  . busPIRone (BUSPAR) 7.5 MG tablet Take 1 tablet (7.5 mg total) by mouth 3 (three) times daily. 90 tablet 0 08/10/2020 at 0330  . Calcium Carbonate 500 MG CHEW Chew 500 mg by mouth daily at 12 noon.   08/09/2020 at Unknown time  . Cod Liver Oil 1000 MG CAPS Take 1,000 mg by mouth daily.   08/09/2020 at Unknown time  . diphenhydrAMINE (BENADRYL) 25 MG tablet Take 25 mg by mouth every 6 (six) hours as needed.   Past Week at Unknown time  . furosemide (LASIX) 20 MG tablet TAKE 1 TABLET (20 MG TOTAL) BY MOUTH DAILY AS NEEDED. (Patient taking differently:  Take 20 mg by mouth daily as needed for fluid or edema.) 90 tablet 1 08/09/2020 at Unknown time  . gabapentin (NEURONTIN) 300 MG capsule TAKE 1-2 CAPSULES (300-600 MG TOTAL) BY MOUTH AT BEDTIME. 180 capsule 1 08/09/2020 at Unknown time  . ipratropium (ATROVENT) 0.06 % nasal spray Place 2 sprays into the nose daily.   08/09/2020 at Unknown time  . loratadine (CLARITIN) 10 MG tablet Take 20 mg by mouth daily.   Past Week at Unknown time  . metFORMIN (GLUCOPHAGE) 1000 MG tablet Take 0.5 tablets (500 mg total) by mouth daily with breakfast. 45 tablet 0 08/07/2020  . metoprolol succinate (TOPROL-XL) 100 MG 24 hr tablet TAKE 1 TABLET (100 MG TOTAL) BY MOUTH IN THE MORNING AND AT BEDTIME. TAKE WITH OR IMMEDIATELY FOLLOWING A MEAL. 180 tablet 1 08/10/2020 at 0330  . Milnacipran (SAVELLA) 50 MG TABS tablet TAKE 1 TABLET (50 MG TOTAL) BY MOUTH 2 (TWO) TIMES DAILY. *START AFTER TITRATION PACK IS COMPLETE* 180 tablet 1 08/09/2020 at Unknown time  . modafinil (PROVIGIL) 200 MG tablet TAKE 2 TABLETS BY MOUTH DAILY 180 tablet 1 08/09/2020 at Unknown time  . Multiple Vitamins-Minerals (MULTIVITAMIN WITH MINERALS) tablet Take 1 tablet by mouth daily.   08/09/2020 at Unknown time  . ranolazine (RANEXA) 500 MG 12 hr tablet TAKE 1 TABLET (500 MG TOTAL) BY MOUTH 2 (TWO) TIMES DAILY. 60 tablet 3 08/10/2020 at 0330  . spironolactone (ALDACTONE) 50 MG tablet TAKE 1 TABLET (50 MG TOTAL) BY MOUTH 3 (THREE) TIMES DAILY. 270 tablet 3 08/09/2020 at Unknown time  . thyroid (ARMOUR) 90 MG tablet TAKE 1 TABLET (90 MG TOTAL) BY MOUTH ONCE DAILY 90 tablet 3 08/10/2020 at 0330  . TRULANCE 3 MG TABS Take 3 mg by mouth daily.   08/09/2020 at Unknown time  . Vitamin D, Ergocalciferol, (DRISDOL) 1.25 MG (50000 UNIT) CAPS capsule TAKE 1 CAPSULE (50,000 UNITS TOTAL) BY MOUTH EVERY 7 (SEVEN) DAYS. 12 capsule 1 08/09/2020 at Unknown time  . [DISCONTINUED] acetaminophen (TYLENOL) 325 MG tablet Take 650 mg by mouth every 6 (six) hours as needed for moderate  pain or mild pain.   Past Week at Unknown time  . Evolocumab 140 MG/ML SOAJ INJECT 1 DOSE INTO THE SKIN EVERY 14 (FOURTEEN) DAYS. (Patient taking differently: Inject 140 mg into the skin every 14 (fourteen) days.) 6 mL 3 07/26/2020  . FREESTYLE LITE test strip USE TO CHECK BLOOD SUGAR EVERY DAY AND AS NEEDED 100 strip 5 supply    Peggyann Juba, PharmD, BCPS Pharmacy:  [phone] 08/14/2020,10:31 AM

## 2020-08-15 LAB — GLUCOSE, CAPILLARY
Glucose-Capillary: 41 mg/dL — CL (ref 70–99)
Glucose-Capillary: 42 mg/dL — CL (ref 70–99)
Glucose-Capillary: 50 mg/dL — ABNORMAL LOW (ref 70–99)
Glucose-Capillary: 58 mg/dL — ABNORMAL LOW (ref 70–99)
Glucose-Capillary: 58 mg/dL — ABNORMAL LOW (ref 70–99)
Glucose-Capillary: 59 mg/dL — ABNORMAL LOW (ref 70–99)
Glucose-Capillary: 61 mg/dL — ABNORMAL LOW (ref 70–99)
Glucose-Capillary: 62 mg/dL — ABNORMAL LOW (ref 70–99)
Glucose-Capillary: 62 mg/dL — ABNORMAL LOW (ref 70–99)
Glucose-Capillary: 63 mg/dL — ABNORMAL LOW (ref 70–99)
Glucose-Capillary: 65 mg/dL — ABNORMAL LOW (ref 70–99)
Glucose-Capillary: 68 mg/dL — ABNORMAL LOW (ref 70–99)
Glucose-Capillary: 70 mg/dL (ref 70–99)
Glucose-Capillary: 74 mg/dL (ref 70–99)
Glucose-Capillary: 87 mg/dL (ref 70–99)
Glucose-Capillary: 89 mg/dL (ref 70–99)

## 2020-08-15 LAB — GLUCOSE, RANDOM: Glucose, Bld: 83 mg/dL (ref 70–99)

## 2020-08-15 MED ORDER — GLUCOSE 4 G PO CHEW
1.0000 | CHEWABLE_TABLET | Freq: Once | ORAL | Status: AC | PRN
  Administered 2020-08-15: 4 g via ORAL

## 2020-08-15 MED ORDER — DEXTROSE-NACL 5-0.9 % IV SOLN: INTRAVENOUS | Status: DC

## 2020-08-15 NOTE — Progress Notes (Signed)
5 Days Post-Op   Subjective/Chief Complaint: Patient subjectively feels much better today, no nausea or vomiting. Taking in clear liquids and protein drinks. Still had multiple episodes of hypoglycemia overnight down to 50s.  Objective: Vital signs in last 24 hours: Temp:  [98 F (36.7 C)-98.6 F (37 C)] 98.4 F (36.9 C) (04/24 0620) Pulse Rate:  [73-81] 81 (04/24 0620) Resp:  [17-18] 18 (04/24 0620) BP: (112-137)/(66-84) 131/84 (04/24 0620) SpO2:  [85 %-100 %] 100 % (04/24 0620) Last BM Date: 08/14/20  Intake/Output from previous day: 04/23 0701 - 04/24 0700 In: 4176.5 [P.O.:940; I.V.:3136.5; IV Piggyback:100] Out: 2550 [Urine:2550] Intake/Output this shift: Total I/O In: 60 [P.O.:60] Out: -   Alert, comfortable Normal work of breathing on room air, symmetric chest wall expansion Soft, nontender, incisions c/d/i no cellulitis or hematoma, dressings removed No edema  Lab Results:  Recent Labs    08/14/20 0413  WBC 5.6  HGB 11.8*  HCT 36.0  PLT 275   BMET Recent Labs    08/14/20 0413 08/14/20 1705  NA 136  --   K 3.7  --   CL 104  --   CO2 21*  --   GLUCOSE 91 92  BUN 11  --   CREATININE 0.84  --   CALCIUM 8.8*  --    PT/INR No results for input(s): LABPROT, INR in the last 72 hours. ABG No results for input(s): PHART, HCO3 in the last 72 hours.  Invalid input(s): PCO2, PO2  Studies/Results: No results found.  Anti-infectives: Anti-infectives (From admission, onward)   Start     Dose/Rate Route Frequency Ordered Stop   08/10/20 0600  gentamicin (GARAMYCIN) 110 mg, clindamycin (CLEOCIN) 900 mg in dextrose 5 % 100 mL IVPB        1.5 mg/kg  76.4 kg (Adjusted) 217.5 mL/hr over 30 Minutes Intravenous On call to O.R. 08/09/20 0708 08/10/20 1709      Assessment/Plan: s/p Procedure(s): LAPAROSCOPIC ROUX-EN-Y GASTRIC BYPASS WITH UPPER ENDOSCOPY (N/A) UPPER GI ENDOSCOPY (N/A) LAPAROSCOPIC PRIMARY REPAIR OF INCISIONAL HERNIA   -Advance to bariatric  full liquid diet. -Continue hypoglycemia, hopefully this will improve as PO intake continues to improve. -Continue IV fluid hydration    LOS: 5 days   Michaelle Birks, MD [facility] Surgery General, Hepatobiliary and Pancreatic Surgery 08/15/20 8:00 AM

## 2020-08-15 NOTE — Progress Notes (Signed)
Pt cbg is 41. pts hands were very cold and hard to get blood return. Stat glucose ordered to verify glucose. Lab glucose showed 83.Pt had not had anything by mouth between the cbg and lab drawn blood glucose MD on call ordered to only do fingerstick cbg if pt is symptomatic and she is currently asymptomatic. Will redraw BMP in am per MD order. Informed pt of plan.

## 2020-08-15 NOTE — Progress Notes (Addendum)
Hypoglycemic Event  CBG: 63  Treatment: glucose tablet and protein shake  Symptoms: none  Follow-up CBG: Time: CBG Result:74  Possible Reasons for Event: inadequate intake due to surgery  Comments/MD notified:    Charlyne Petrin

## 2020-08-15 NOTE — Progress Notes (Addendum)
Pt blood sugar at 1130 is 62. Pt has only had sips of drinks throughout the morning, I instructed her to drink the low fat milk and more of her protein in order to keep sugars at an acceptable range. Will recheck in 15 minutes. Recheck was 42. I consulted with  former bariatric coordinator and was advised to give patient 2oz of cranberry juice and recheck. Patient is also sipping on strained soup. Recheck 65, gave patient additional 2 oz of cranberry juice, rechecked sugar was 59. Consulted with on call MD who advised me to continue treating as we are at this moment to avoid large spikes in levels. Will continue to monitor.

## 2020-08-15 NOTE — Progress Notes (Signed)
Hypoglycemic Event  CBG: 57  Treatment: glucose tablet  Symptoms: None  Follow-up CBG: Time: 0025 CBG Result:87  Possible Reasons for Event: Inadequate meal intake  Comments/MD notified:    Rosie Fate

## 2020-08-15 NOTE — Progress Notes (Signed)
Patient continues to have hypoglycemic episodes, regardless of what she is eating or drinking. MD paged and placed orders for D5 NS @ 20. Will check sugar again prior to shift change.

## 2020-08-15 NOTE — Progress Notes (Signed)
Hypoglycemic Event  CBG: 62  Treatment: glucose tablet/ protein shake  Symptoms: None  Follow-up CBG: Time:0430 CBG Result: 89  Possible Reasons for Event: Inadequate meal intake  Comments/MD notified:    Rosie Fate

## 2020-08-16 ENCOUNTER — Other Ambulatory Visit (HOSPITAL_COMMUNITY): Payer: Self-pay

## 2020-08-16 ENCOUNTER — Other Ambulatory Visit: Payer: Self-pay | Admitting: Family Medicine

## 2020-08-16 ENCOUNTER — Encounter: Payer: Self-pay | Admitting: Family Medicine

## 2020-08-16 LAB — BASIC METABOLIC PANEL
Anion gap: 7 (ref 5–15)
BUN: 8 mg/dL (ref 6–20)
CO2: 25 mmol/L (ref 22–32)
Calcium: 8.9 mg/dL (ref 8.9–10.3)
Chloride: 108 mmol/L (ref 98–111)
Creatinine, Ser: 0.85 mg/dL (ref 0.44–1.00)
GFR, Estimated: >60 mL/min (ref >60)
Glucose, Bld: 99 mg/dL (ref 70–99)
Potassium: 3.9 mmol/L (ref 3.5–5.1)
Sodium: 140 mmol/L (ref 135–145)

## 2020-08-16 MED ORDER — GABAPENTIN 100 MG PO CAPS
300.0000 mg | ORAL_CAPSULE | Freq: Every day | ORAL | 3 refills | Status: DC
  Filled 2020-08-16: qty 90, 30d supply, fill #0

## 2020-08-16 NOTE — Plan of Care (Signed)
  Problem: Education: Goal: Knowledge of General Education information will improve Description: Including pain rating scale, medication(s)/side effects and non-pharmacologic comfort measures Outcome: Adequate for Discharge   Problem: Health Behavior/Discharge Planning: Goal: Ability to manage health-related needs will improve Outcome: Adequate for Discharge   Problem: Clinical Measurements: Goal: Ability to maintain clinical measurements within normal limits will improve Outcome: Adequate for Discharge Goal: Will remain free from infection Outcome: Adequate for Discharge Goal: Diagnostic test results will improve Outcome: Adequate for Discharge Goal: Respiratory complications will improve Outcome: Adequate for Discharge Goal: Cardiovascular complication will be avoided Outcome: Adequate for Discharge   Problem: Activity: Goal: Risk for activity intolerance will decrease Outcome: Adequate for Discharge   Problem: Nutrition: Goal: Adequate nutrition will be maintained Outcome: Adequate for Discharge   Problem: Coping: Goal: Level of anxiety will decrease Outcome: Adequate for Discharge   Problem: Elimination: Goal: Will not experience complications related to bowel motility Outcome: Adequate for Discharge Goal: Will not experience complications related to urinary retention Outcome: Adequate for Discharge   Problem: Pain Managment: Goal: General experience of comfort will improve Outcome: Adequate for Discharge   Problem: Safety: Goal: Ability to remain free from injury will improve Outcome: Adequate for Discharge   Problem: Skin Integrity: Goal: Risk for impaired skin integrity will decrease Outcome: Adequate for Discharge   Problem: Education: Goal: Ability to state signs and symptoms to report to health care provider will improve Outcome: Adequate for Discharge Goal: Knowledge of the prescribed self-care regimen will improve Outcome: Adequate for  Discharge Goal: Knowledge of discharge needs will improve Outcome: Adequate for Discharge   Problem: Activity: Goal: Ability to tolerate increased activity will improve Outcome: Adequate for Discharge   Problem: Bowel/Gastric: Goal: Gastrointestinal status for postoperative course will improve Outcome: Adequate for Discharge Goal: Occurrences of nausea will decrease Outcome: Adequate for Discharge   Problem: Coping: Goal: Development of coping mechanisms to deal with changes in body function or appearance will improve Outcome: Adequate for Discharge   Problem: Fluid Volume: Goal: Maintenance of adequate hydration will improve Outcome: Adequate for Discharge   Problem: Nutritional: Goal: Nutritional status will improve Outcome: Adequate for Discharge   Problem: Clinical Measurements: Goal: Will show no signs or symptoms of venous thromboembolism Outcome: Adequate for Discharge Goal: Will remain free from infection Outcome: Adequate for Discharge Goal: Will show no signs of GI Leak Outcome: Adequate for Discharge   Problem: Respiratory: Goal: Will regain and/or maintain adequate ventilation Outcome: Adequate for Discharge   Problem: Pain Management: Goal: Pain level will decrease Outcome: Adequate for Discharge   Problem: Skin Integrity: Goal: Demonstration of wound healing without infection will improve Outcome: Adequate for Discharge   

## 2020-08-16 NOTE — Discharge Instructions (Signed)
If you feel like you are hypoglycemic (low blood sugars) - drink 1/2 cup low fat milk  GASTRIC BYPASS / [place] Instructions  These instructions are to help you care for yourself when you go home.  Call: If you have any problems. . Call [phone] and ask for the surgeon on call . If you have an emergency related to your surgery please use the ER at [facility].  . Tell the ER staff that you are a new post-op gastric bypass or gastric sleeve patient   Signs and symptoms to report: . Severe vomiting or nausea o If you cannot handle clear liquids for longer than 1 day, call your surgeon  . Abdominal pain which does not get better after taking your pain medication . Fever greater than 100.4 F and chills . Heart rate over 100 beats a minute . Trouble breathing . Chest pain .  Redness, swelling, drainage, or foul odor at incision (surgical) sites .  If your incisions open or pull apart . Swelling or pain in calf (lower leg) . Diarrhea (Loose bowel movements that happen often), frequent watery, uncontrolled bowel movements . Constipation, (no bowel movements for 3 days) if this happens:  o Take Milk of Magnesia, 2 tablespoons by mouth, 3 times a day for 2 days if needed o Stop taking Milk of Magnesia once you have had a bowel movement o Call your doctor if constipation continues Or o Take Miralax  (instead of Milk of Magnesia) following the label instructions o Stop taking Miralax once you have had a bowel movement o Call your doctor if constipation continues . Anything you think is "abnormal for you"   Normal side effects after surgery: . Unable to sleep at night or unable to concentrate . Irritability . Being tearful (crying) or depressed These are common complaints, possibly related to your anesthesia, stress of surgery and change in lifestyle, that usually go away a few weeks after surgery.  If these feelings continue, call your medical doctor.  Wound Care: You may  have surgical glue, steri-strips, or staples over your incisions after surgery . Surgical glue:  Looks like a clear film over your incisions and will wear off a little at a time . Steri-strips : Adhesive strips of tape over your incisions. You may notice a yellowish color on the skin under the steri-strips. This is used to make the   steri-strips stick better. Do not pull the steri-strips off - let them fall off . Staples: Jodell Cipro may be removed before you leave the hospital o If you go home with staples, call [facility] at for an appointment with your surgeon's nurse to have staples removed 10 days after surgery, [phone] . Showering: You may shower two (2) days after your surgery unless your surgeon tells you differently o Wash gently around incisions with warm soapy water, rinse well, and gently pat dry  o If you have a drain (tube from your incision), you may need someone to hold this while you shower  o No tub baths until staples are removed and incisions are healed     Medications: Marland Kitchen Medications should be liquid or crushed if larger than the size of a dime . Extended release pills (medication that releases a little bit at a time through the day) should not be crushed . Depending on the size and number of medications you take, you may need to space (take a few throughout the day)/change the time you take  your medications so that you do not over-fill your pouch (smaller stomach) . Make sure you follow-up with your primary care physician to make medication changes needed during rapid weight loss and life-style changes . If you have diabetes, follow up with the doctor that orders your diabetes medication(s) within one week after surgery and check your blood sugar regularly. . Do not drive while taking narcotics (pain medications) . DO NOT take NSAID'S (Examples of NSAID's include ibuprofen, naproxen)  Diet:                    First 2 Weeks  You will see the nutritionist about  two (2) weeks after your surgery. The nutritionist will increase the types of foods you can eat if you are handling liquids well: Marland Kitchen If you have severe vomiting or nausea and cannot handle clear liquids lasting longer than 1 day, call your surgeon  Protein Shake . Drink at least 2 ounces of shake 5-6 times per day . Each serving of protein shakes (usually 8 - 12 ounces) should have a minimum of:  o 15 grams of protein  o And no more than 5 grams of carbohydrate  . Goal for protein each day: o Men = 80 grams per day o Women = 60 grams per day . Protein powder may be added to fluids such as non-fat milk or Lactaid milk or Soy milk (limit to 35 grams added protein powder per serving)  Hydration . Slowly increase the amount of water and other clear liquids as tolerated (See Acceptable Fluids) . Slowly increase the amount of protein shake as tolerated  .  Sip fluids slowly and throughout the day . May use sugar substitutes in small amounts (no more than 6 - 8 packets per day; i.e. Splenda)  Fluid Goal . The first goal is to drink at least 8 ounces of protein shake/drink per day (or as directed by the nutritionist);  See handout from pre-op Bariatric Education Class for examples of protein shake/drink.   o Slowly increase the amount of protein shake you drink as tolerated o You may find it easier to slowly sip shakes throughout the day o It is important to get your proteins in first . Your fluid goal is to drink 64 - 100 ounces of fluid daily o It may take a few weeks to build up to this . 32 oz (or more) should be clear liquids  And  . 32 oz (or more) should be full liquids (see below for examples) . Liquids should not contain sugar, caffeine, or carbonation  Clear Liquids: . Water or Sugar-free flavored water (i.e. Fruit H2O, Propel) . Decaffeinated coffee or tea (sugar-free) . [employer], [employer], Minute [facility] . Sugar-free Jell-O . Bouillon or broth . Sugar-free Popsicle:    *Less than 20 calories each; Limit 1 per day  Full Liquids: Protein Shakes/Drinks + 2 choices per day of other full liquids . Full liquids must be: o No More Than 12 grams of Carbs per serving  o No More Than 3 grams of Fat per serving . Strained low-fat cream soup . Non-Fat milk . Fat-free Lactaid Milk . Sugar-free yogurt (Dannon Lite & Fit, Greek yogurt)      Vitamins and Minerals . Start 1 day after surgery unless otherwise directed by your surgeon . Bariatric Specific Complete Multivitamins . Chewable Calcium Citrate with Vitamin D-3 (Example: 3 Chewable Calcium Plus 600 with Vitamin D-3) o Take 500 mg three (3)  times a day for a total of 1500 mg each day o Do not take all 3 doses of calcium at one time as it may cause constipation, and you can only absorb 500 mg  at a time  o Do not mix multivitamins containing iron with calcium supplements; take 2 hours apart  . Menstruating women and those at risk for anemia (a blood disease that causes weakness) may need extra iron o Talk with your doctor to see if you need more iron . If you need extra iron: Total daily Iron recommendation (including Vitamins) is 50 to 100 mg Iron/day . Do not stop taking or change any vitamins or minerals until you talk to your nutritionist or surgeon . Your nutritionist and/or surgeon must approve all vitamin and mineral supplements   Activity and Exercise: It is important to continue walking at home.  Limit your physical activity as instructed by your doctor.  During this time, use these guidelines: . Do not lift anything greater than ten (10) pounds for at least two (2) weeks . Do not go back to work or drive until Engineer, production says you can . You may have sex when you feel comfortable  o It is VERY important for female patients to use a reliable birth control method; fertility often increases after surgery  o Do not get pregnant for at least 18 months . Start exercising as soon as your doctor tells you  that you can o Make sure your doctor approves any physical activity . Start with a simple walking program . Walk 5-15 minutes each day, 7 days per week.  . Slowly increase until you are walking 30-45 minutes per day Consider joining our [place] program. [phone] or email [email or web address]   Special Instructions Things to remember:  . Use your CPAP when sleeping if this applies to you, do not stop the use of CPAP unless directed by physician after a sleep study . [facility] has a free Bariatric Surgery Support Group that meets monthly, the 3rd Thursday, 6 pm.  Please review discharge information for date and location of this meeting. . It is very important to keep all follow up appointments with your surgeon, nutritionist, primary care physician, and behavioral health practitioner o After the first year, please follow up with your bariatric surgeon and nutritionist at least once a year in order to maintain best weight loss results   [facility]: [place]: [phone] Bariatric Nurse Coordinator: [phone]

## 2020-08-16 NOTE — Progress Notes (Addendum)
Glucose on BMP is 99 this am

## 2020-08-16 NOTE — Discharge Summary (Signed)
Physician Discharge Summary  [name]:[id number] DOB: 04-11-1961 DOA: 08/10/2020  PCP: Mosie Lukes, MD  Admit date: 08/10/2020 Discharge date: 08/16/2020  Recommendations for Outpatient Follow-up:     Follow-up Information    [name], MD. Go on 09/01/2020.   Specialty: General Surgery Why: at 9:30am.  Please arrive 15 minutes prior to your appointment time.  Thank you. Contact information: [address] [place] [address] [phone]        Sur[facility], [place]. Go on 10/07/2020.   Specialty: General Surgery Why: at 9:00am with Dr. [name].  Please arrive 15 minutes prior to your appointment time. Thank you. Contact information: [address] [place] [address] [phone]              Discharge Diagnoses:  Principal Problem:   Severe obesity (BMI >= 40) (HCC) Active Problems:   Benign essential hypertension   Diabetes mellitus type 2 in obese (HCC)   Chronic pain syndrome   Hypersomnia with sleep apnea   Umbilical hernia   Carpal tunnel syndrome of right wrist   Thyroid disease   GERD (gastroesophageal reflux disease)   Gastric bypass status for obesity OSA on cpap Artifactual hypoglycemia in Raynaud's phenomenon  Surgical Procedure: Laparoscopic Roux-en-Y gastric bypass, laparoscopic primary incisional hernia repair, upper endoscopy  Discharge Condition: Good Disposition: Home  Diet recommendation: Postoperative gastric bypass diet  Filed Weights   08/09/20 0600 08/10/20 0522  Weight: 109 kg 108.6 kg     Hospital Course:  The patient was admitted for a planned laparoscopic Roux-en-Y gastric bypass. Please see operative note. Preoperatively the patient was given 5000 units of subcutaneous heparin for DVT prophylaxis. ERAS protocol was used. Postoperative prophylactic Lovenox dosing was started on the evening of postoperative day 0.  The patient was started on ice chips and water on the evening of POD 0  which they tolerated. On postoperative day 1 The patient's diet was advanced to protein shakes which they also tolerated. On POD 2, The patient was ambulating without difficulty. Their vital signs are stable without fever or tachycardia. Their hemoglobin had remained stable. The patient was maintained on their home settings for CPAP therapy. However the patient started to have low blood sugars down as low 30s/40s so her discharge was cancelled. She was treated for hypoglycemia multiple times. Different efforts were initiated to prevent these events - dextrose ivf, stopping gabapentin, increasing oral intake.  We had to be careful in how we treated these episodes in order to prevent reactive hypoglycemia (postprandial hypoglycemia).  Her random low blood sugars did not occur after eating as she was on a postop bariatric clear/full liquid diet. She did have some n/v early on late last week. But over the weekend, her oral intake improved significantly, no n/v.   Her actual peripheral blood draws never showed hypoglycemia.    After doing research - we found case reports and literature where pts with Raynaud can have artifactual hypoglycemia on finger sticks. She had a finger stick BS of 41 and her peripheral blood draw 30 minutes later (with no intervention between the two) showed a peripheral blood glucose of 83.    I discussed this finding with the patient.  Her peripheral blood draws have had consistently normal BS.    The patient had received discharge instructions and counseling. They were deemed stable for discharge.  BP 125/67 (BP Location: Right Arm)   Pulse 73   Temp 98.6 F (37  C) (Oral)   Resp 18   Ht 5\' 4"  (1.626 m)   Wt 108.6 kg   SpO2 98%   BMI 41.09 kg/m   Gen: alert, NAD, non-toxic appearing Pupils: equal, no scleral icterus Pulm: Lungs clear to auscultation, symmetric chest rise CV: regular rate and rhythm Abd: soft, min tender, nondistended. No cellulitis. No incisional  hernia Ext: no edema, no calf tenderness Skin: no rash, no jaundice  Discharge Instructions  Discharge Instructions    Ambulate hourly while awake   Complete by: As directed    Call MD for:  difficulty breathing, headache or visual disturbances   Complete by: As directed    Call MD for:  persistant dizziness or light-headedness   Complete by: As directed    Call MD for:  persistant nausea and vomiting   Complete by: As directed    Call MD for:  redness, tenderness, or signs of infection (pain, swelling, redness, odor or green/yellow discharge around incision site)   Complete by: As directed    Call MD for:  severe uncontrolled pain   Complete by: As directed    Call MD for:  temperature >101 F   Complete by: As directed    Diet bariatric full liquid   Complete by: As directed    Discharge instructions   Complete by: As directed    See bariatric discharge instructions   Discharge patient   Complete by: As directed    Discharge disposition: 01-Home or Self Care   Discharge patient date: 08/16/2020   Incentive spirometry   Complete by: As directed    Perform hourly while awake     Allergies as of 08/16/2020      Reactions   Crestor [rosuvastatin] Other (See Comments)   Myalgias and rising CPK   Losartan Cough   Wellbutrin [bupropion] Other (See Comments)   Dizziness and mental status changes   Atorvastatin    myalgias   Amoxicillin Rash   Aspirin Nausea Only, Other (See Comments)   GI upset REACTION: Upset stomach   Codeine Rash   Erythromycin Rash, Other (See Comments)   Penicillins Rash   Reaction: 15 years      Medication List    STOP taking these medications   aspirin EC 81 MG tablet   metFORMIN 1000 MG tablet Commonly known as: GLUCOPHAGE   metoprolol succinate 100 MG 24 hr tablet Commonly known as: TOPROL-XL   Trulance 3 MG Tabs Generic drug: Plecanatide     TAKE these medications   acetaminophen 500 MG tablet Commonly known as: TYLENOL Take 2  tablets (1,000 mg total) by mouth every 8 (eight) hours for 5 days. What changed:   medication strength  how much to take  when to take this  reasons to take this   albuterol 108 (90 Base) MCG/ACT inhaler Commonly known as: VENTOLIN HFA INHALE 2 PUFFS INTO THE LUNGS EVERY 4 (FOUR) HOURS AS NEEDED FOR WHEEZING OR SHORTNESS OF BREATH.   Armour Thyroid 90 MG tablet Generic drug: thyroid TAKE 1 TABLET (90 MG TOTAL) BY MOUTH ONCE DAILY   busPIRone 7.5 MG tablet Commonly known as: BUSPAR Take 1 tablet (7.5 mg total) by mouth 3 (three) times daily.   Calcium Carbonate 500 MG Chew Chew 500 mg by mouth daily at 12 noon.   Cod Liver Oil 1000 MG Caps Take 1,000 mg by mouth daily.   diphenhydrAMINE 25 MG tablet Commonly known as: BENADRYL Take 25 mg by mouth every 6 (six) hours  as needed.   FREESTYLE LITE test strip Generic drug: glucose blood USE TO CHECK BLOOD SUGAR EVERY DAY AND AS NEEDED   furosemide 20 MG tablet Commonly known as: LASIX TAKE 1 TABLET (20 MG TOTAL) BY MOUTH DAILY AS NEEDED. What changed:   how much to take  how to take this  when to take this  reasons to take this   gabapentin 300 MG capsule Commonly known as: NEURONTIN TAKE 1-2 CAPSULES (300-600 MG TOTAL) BY MOUTH AT BEDTIME.   ipratropium 0.06 % nasal spray Commonly known as: ATROVENT Place 2 sprays into the nose daily.   loratadine 10 MG tablet Commonly known as: CLARITIN Take 20 mg by mouth daily.   metoprolol tartrate 25 MG tablet Commonly known as: LOPRESSOR Take 1 tablet (25 mg total) by mouth 2 (two) times daily.   modafinil 200 MG tablet Commonly known as: PROVIGIL TAKE 2 TABLETS BY MOUTH DAILY   multivitamin with minerals tablet Take 1 tablet by mouth daily.   ondansetron 4 MG disintegrating tablet Commonly known as: ZOFRAN-ODT Take 1 tablet (4 mg total) by mouth every 6 (six) hours as needed for nausea or vomiting.   pantoprazole 40 MG tablet Commonly known as:  PROTONIX Take 1 tablet (40 mg total) by mouth daily.   ranolazine 500 MG 12 hr tablet Commonly known as: RANEXA TAKE 1 TABLET (500 MG TOTAL) BY MOUTH 2 (TWO) TIMES DAILY.   Repatha SureClick 562 MG/ML Soaj Generic drug: Evolocumab INJECT 1 DOSE INTO THE SKIN EVERY 14 (FOURTEEN) DAYS. What changed:   how much to take  how to take this  when to take this   Savella 50 MG Tabs tablet Generic drug: Milnacipran TAKE 1 TABLET (50 MG TOTAL) BY MOUTH 2 (TWO) TIMES DAILY. *START AFTER TITRATION PACK IS COMPLETE*   spironolactone 50 MG tablet Commonly known as: ALDACTONE TAKE 1 TABLET (50 MG TOTAL) BY MOUTH 3 (THREE) TIMES DAILY.   Vitamin D (Ergocalciferol) 1.25 MG (50000 UNIT) Caps capsule Commonly known as: DRISDOL TAKE 1 CAPSULE (50,000 UNITS TOTAL) BY MOUTH EVERY 7 (SEVEN) DAYS.       Follow-up Information    [name], MD. Go on 09/01/2020.   Specialty: General Surgery Why: at 9:30am.  Please arrive 15 minutes prior to your appointment time.  Thank you. Contact information: [address] [place] [address] [phone]        [facility], [place]. Go on 10/07/2020.   Specialty: General Surgery Why: at 9:00am with Dr. [name].  Please arrive 15 minutes prior to your appointment time. Thank you. Contact information: [address] [place] [address] [phone]                The results of significant diagnostics from this hospitalization (including imaging, microbiology, ancillary and laboratory) are listed below for reference.    Significant Diagnostic Studies: No results found.  Labs: Basic Metabolic Panel: Recent Labs  Lab 08/11/20 0424 08/12/20 1658 08/12/20 1831 08/14/20 0413 08/14/20 1705 08/15/20 2029 08/16/20 0440  NA 139  --   --  136  --   --  140  K 4.3  --   --  3.7  --   --  3.9  CL 106  --   --  104  --   --  108  CO2 24  --   --  21*  --   --  25  GLUCOSE 117*   < > 97 91  92 83 99  BUN 11  --    --  11  --   --  8  CREATININE 0.93  --   --  0.84  --   --  0.85  CALCIUM 9.4  --   --  8.8*  --   --  8.9  MG  --   --   --  1.6*  --   --   --    < > = values in this interval not displayed.   Liver Function Tests: Recent Labs  Lab 08/11/20 0424  AST 80*  ALT 100*  ALKPHOS 67  BILITOT 0.5  PROT 6.7  ALBUMIN 3.7    CBC: Recent Labs  Lab 08/10/20 1431 08/11/20 0424 08/12/20 0437 08/14/20 0413  WBC  --  9.7 10.0 5.6  NEUTROABS  --  7.9* 7.1  --   HGB 13.4 12.7 13.2 11.8*  HCT 40.3 37.3 40.2 36.0  MCV  --  91.4 94.1 95.5  PLT  --  313 335 275    CBG: Recent Labs  Lab 08/15/20 1541 08/15/20 1644 08/15/20 1749 08/15/20 1854 08/15/20 1955  GLUCAP 50* 58* 58* 61* 41*    Principal Problem:   Severe obesity (BMI >= 40) (HCC) Active Problems:   Benign essential hypertension   Diabetes mellitus type 2 in obese (HCC)   Chronic pain syndrome   Hypersomnia with sleep apnea   Umbilical hernia   Carpal tunnel syndrome of right wrist   Thyroid disease   GERD (gastroesophageal reflux disease)   Gastric bypass status for obesity   Time coordinating discharge: 20 min  Signed:  Gayland Curry, MD [facility], [place] [phone] 08/16/2020, 9:37 AM

## 2020-08-16 NOTE — Progress Notes (Signed)
24hr fluid recall: 962mL.  Per dehydration protocol, will call pt to f/u within one week post.

## 2020-08-16 NOTE — Progress Notes (Signed)
Pt was given DC instructions and all questions were answered. Patient was taken to the main entrance via wheelchair.

## 2020-08-17 ENCOUNTER — Other Ambulatory Visit: Payer: Self-pay | Admitting: *Deleted

## 2020-08-17 ENCOUNTER — Other Ambulatory Visit (HOSPITAL_COMMUNITY): Payer: Self-pay

## 2020-08-17 MED FILL — Thyroid Tab 90 MG (1 1/2 Grain): ORAL | 90 days supply | Qty: 90 | Fill #0 | Status: AC

## 2020-08-17 NOTE — Patient Outreach (Signed)
[facility]) Care Management  08/17/2020  DARLING CIESLEWICZ 26-Mar-1961 [id number]   Transition of care call/case closure   Referral received:08/10/20 Initial outreach:08/17/20 Insurance: [employer]    Subjective: Initial successful telephone call to patient's preferred number in order to complete transition of care assessment; 2 HIPAA identifiers verified. Explained purpose of call and completed transition of care assessment.  [name] states that she is doing okay . She  denies post-operative problems, says surgical incisions are unremarkable, states surgical pain well managed with prescribed medications. She denies nausea tolerating progression with protein and liquid intake. She  denies bowel or bladder problems.  Spouse is assisting with her recovery. She reports tolerating mobility in home, continues to use incentive spirometry and resting well with her CPAP on.  She denies episodes of hypoglycemia symptoms has not checked blood sugar at home yet, she discussed episodes of hypoglycemia during hospital stay and concern regarding possibly raynaud disease causing low blood sugar reading with finger sticks, and she  states  blood draw ok, she has scheduled appointment with endocrinology for this week to discuss.  Reviewed accessing the following [place] Benefits :  She discussed  ongoing health issues of hypertension , diabetes and is enrolled and actively participating in Active health management .   She has the hospital indemnity plan and has filed a claim.  She uses a [facility] outpatient pharmacy, [facility].   .    Objective:  [name]  was hospitalized at [facility] 4/19-4/25/22 for Laparoscopic gastric bypass  Comorbidities include: Hypertension, Raynaud disease, OSA with CPAP, GERD, Diabetes She  was discharged to home on 08/16/20 without the need for home health services or DME.   Assessment:  Patient voices good  understanding of all discharge instructions.  See transition of care flowsheet for assessment details.   Plan:  Reviewed hospital discharge diagnosis of Laparoscopic gastric bypass   and discharge treatment plan using hospital discharge instructions, assessing medication adherence, reviewing problems requiring provider notification, and discussing the importance of follow up with surgeon, primary care provider and/or specialists as directed.  Reviewed [place] healthy lifestyle program information to receive discounted premium for  2023   Step 1: Get  your annual physical  Step 2: Complete your health assessment  Step 3:Identify your current health status and complete the corresponding action step between April 24, 2020 and December 23, 2020.    Using [place] website, verified that patient is an active participate in [place]'s Active Health Management chronic disease management program.    No ongoing care management needs identified so will close case to [place] Management services and route successful outreach letter with [place] Management pamphlet and 24 Hour Nurse Line Magnet to [place] Management clinical pool to be mailed to patient's home address.  Thanked patient for their services to [facility].  Joylene Draft, RN, BSN  [place] Management Coordinator  [phone]- Mobile [phone]- Toll Free Main Office

## 2020-08-18 ENCOUNTER — Ambulatory Visit (INDEPENDENT_AMBULATORY_CARE_PROVIDER_SITE_OTHER): Payer: 59 | Admitting: Professional

## 2020-08-18 DIAGNOSIS — F411 Generalized anxiety disorder: Secondary | ICD-10-CM

## 2020-08-19 ENCOUNTER — Telehealth (HOSPITAL_COMMUNITY): Payer: Self-pay | Admitting: *Deleted

## 2020-08-19 ENCOUNTER — Other Ambulatory Visit (HOSPITAL_COMMUNITY): Payer: Self-pay

## 2020-08-19 DIAGNOSIS — E042 Nontoxic multinodular goiter: Secondary | ICD-10-CM | POA: Diagnosis not present

## 2020-08-19 DIAGNOSIS — E162 Hypoglycemia, unspecified: Secondary | ICD-10-CM | POA: Diagnosis not present

## 2020-08-19 DIAGNOSIS — E039 Hypothyroidism, unspecified: Secondary | ICD-10-CM | POA: Diagnosis not present

## 2020-08-19 NOTE — Telephone Encounter (Signed)
1.  Tell me about your pain and pain management? Pt denies any pain.   2.  Let's talk about fluid intake.  How much total fluid are you taking in? Pt states that she is getting in at least 64oz of fluid including protein shakes, bottled and protein water, and cream soups.   3.  How much protein have you taken in the last 2 days? Pt states she is meeting her goal of 60g of protein each day with the protein shakes, protein water, and protein powder.   4.  Have you had nausea?  Tell me about when have experienced nausea and what you did to help? Pt denies nausea.   5.  Has the frequency or color changed with your urine? Pt states that she is urinating "fine" with no changes in frequency or urgency.     6.  Tell me what your incisions look like? "Incisions look fine". Pt denies a fever, chills.  Pt states incisions are not swollen, open, or draining.  Pt encouraged to call [facility] if incisions change.   7.  Have you been passing gas? BM? Pt states that she is not having BMs. Pt states that she is taking a stool softener, but is prescribed a medication for constipation that she is currently not taking.  Pt instructed to try either MoM or Miralax and if not able to have a BM, to contact MD.     8.  If a problem or question were to arise who would you call?  Do you know contact numbers for [place], [facility], and [facility]? Pt denies dehydration symptoms.  Pt can describe s/sx of dehydration.  Pt knows to call [facility] for surgical, [facility] for nutrition, and [place] for non-urgent questions or concerns.   9.  How has the walking going? Pt states she is walking around and able to be active without difficulty.   10.  How are your vitamins and calcium going?  How are you taking them? Pt states that she is taking her supplements and vitamins without difficulty.  Pt states that she is going to see her endocrinologist today to discuss options for managing and monitoring her diabetes.  Reminded patient that the first 30 days  post-operatively are important for successful recovery.  Practice good hand hygiene, wearing a mask when appropriate (since optional in most places), and minimizing exposure to people who live outside of the home, especially if they are exhibiting any respiratory, GI, or illness-like symptoms.

## 2020-08-20 ENCOUNTER — Other Ambulatory Visit (HOSPITAL_COMMUNITY): Payer: Self-pay

## 2020-08-21 ENCOUNTER — Other Ambulatory Visit: Payer: Self-pay | Admitting: Family Medicine

## 2020-08-21 MED FILL — Modafinil Tab 200 MG: ORAL | 90 days supply | Qty: 180 | Fill #0 | Status: AC

## 2020-08-22 ENCOUNTER — Other Ambulatory Visit: Payer: Self-pay | Admitting: Family Medicine

## 2020-08-22 MED FILL — Albuterol Sulfate Inhal Aero 108 MCG/ACT (90MCG Base Equiv): RESPIRATORY_TRACT | 25 days supply | Qty: 18 | Fill #0 | Status: AC

## 2020-08-22 MED FILL — Milnacipran HCl Tab 50 MG: ORAL | 90 days supply | Qty: 180 | Fill #0 | Status: AC

## 2020-08-23 ENCOUNTER — Other Ambulatory Visit (HOSPITAL_COMMUNITY): Payer: Self-pay

## 2020-08-23 MED ORDER — SPIRONOLACTONE 50 MG PO TABS
50.0000 mg | ORAL_TABLET | Freq: Three times a day (TID) | ORAL | 3 refills | Status: DC
  Filled 2020-08-23 – 2020-10-29 (×2): qty 270, 90d supply, fill #0
  Filled 2021-02-14: qty 270, 90d supply, fill #1
  Filled 2021-06-03: qty 200, 67d supply, fill #2
  Filled 2021-06-03: qty 70, 23d supply, fill #2

## 2020-08-23 MED ORDER — FUROSEMIDE 20 MG PO TABS
20.0000 mg | ORAL_TABLET | Freq: Every day | ORAL | 1 refills | Status: DC | PRN
  Filled 2020-08-23: qty 90, 90d supply, fill #0
  Filled 2020-11-12: qty 90, 90d supply, fill #1

## 2020-08-24 ENCOUNTER — Encounter: Payer: 59 | Attending: General Surgery | Admitting: Skilled Nursing Facility1

## 2020-08-24 ENCOUNTER — Other Ambulatory Visit: Payer: Self-pay

## 2020-08-24 ENCOUNTER — Other Ambulatory Visit (HOSPITAL_COMMUNITY): Payer: Self-pay

## 2020-08-24 DIAGNOSIS — E1169 Type 2 diabetes mellitus with other specified complication: Secondary | ICD-10-CM | POA: Insufficient documentation

## 2020-08-24 DIAGNOSIS — E669 Obesity, unspecified: Secondary | ICD-10-CM | POA: Insufficient documentation

## 2020-08-26 NOTE — Progress Notes (Signed)
2 Week Post-Operative Nutrition Class   Patient was seen on 08/24/2020 for Post-Operative Nutrition education at the [facility].   Pt states Saturday she had a low of 41 (shaky, sweaty, hot): drank cranberry juice brought it back up to 74 stating her blood sugars are not accurate and tests her ear lobe. States she got a Dexcom placed but the sensor is bad so is waiting on a new one. Pt states she is working with an endocrinologist.   Dietitian advised pt to eat every 3 hours to help deter a low blood sugar.   Surgery date: 08/10/2020 Surgery type: RYGB Start weight at [facility]: 254.9 Weight today: 232.4 lbs     The following the learning objectives were met by the patient during this course:  Identifies Phase 3 (Soft, High Proteins) Dietary Goals and will begin from 2 weeks post-operatively to 2 months post-operatively  Identifies appropriate sources of fluids and proteins   Identifies appropriate fat sources and healthy verses unhealthy fat types    States protein recommendations and appropriate sources post-operatively  Identifies the need for appropriate texture modifications, mastication, and bite sizes when consuming solids  Identifies appropriate multivitamin and calcium sources post-operatively  Describes the need for physical activity post-operatively and will follow MD recommendations  States when to call healthcare provider regarding medication questions or post-operative complications   Handouts given during class include:  Phase 3A: Soft, High Protein Diet Handout  Phase 3 High Protein Meals  Healthy Fats   Follow-Up Plan: Patient will follow-up at [facility] in 6 weeks for 2 month post-op nutrition visit for diet advancement per MD.

## 2020-08-27 ENCOUNTER — Ambulatory Visit (INDEPENDENT_AMBULATORY_CARE_PROVIDER_SITE_OTHER): Payer: 59 | Admitting: Professional

## 2020-08-27 DIAGNOSIS — F411 Generalized anxiety disorder: Secondary | ICD-10-CM | POA: Diagnosis not present

## 2020-08-30 ENCOUNTER — Telehealth: Payer: Self-pay | Admitting: Skilled Nursing Facility1

## 2020-08-30 NOTE — Telephone Encounter (Signed)
RD called pt to verify fluid intake once starting soft, solid proteins 2 week post-bariatric surgery.   Daily Fluid intake: 60 ounces Daily Protein intake: 62 grams Bowel Habits: movement every 3 days; taking mirilax daily, no complaints  Concerns/issues:   No concerns with blood sugars WNL

## 2020-08-31 ENCOUNTER — Ambulatory Visit: Payer: 59 | Admitting: Professional

## 2020-09-09 ENCOUNTER — Ambulatory Visit (INDEPENDENT_AMBULATORY_CARE_PROVIDER_SITE_OTHER): Payer: 59 | Admitting: Family Medicine

## 2020-09-09 ENCOUNTER — Other Ambulatory Visit: Payer: Self-pay

## 2020-09-09 ENCOUNTER — Encounter: Payer: Self-pay | Admitting: Family Medicine

## 2020-09-09 VITALS — BP 120/76 | HR 93 | Temp 98.5°F | Resp 16 | Wt 222.6 lb

## 2020-09-09 DIAGNOSIS — Z1231 Encounter for screening mammogram for malignant neoplasm of breast: Secondary | ICD-10-CM | POA: Diagnosis not present

## 2020-09-09 DIAGNOSIS — E039 Hypothyroidism, unspecified: Secondary | ICD-10-CM

## 2020-09-09 DIAGNOSIS — Z9884 Bariatric surgery status: Secondary | ICD-10-CM

## 2020-09-09 DIAGNOSIS — E559 Vitamin D deficiency, unspecified: Secondary | ICD-10-CM

## 2020-09-09 DIAGNOSIS — E782 Mixed hyperlipidemia: Secondary | ICD-10-CM

## 2020-09-09 DIAGNOSIS — E11649 Type 2 diabetes mellitus with hypoglycemia without coma: Secondary | ICD-10-CM | POA: Diagnosis not present

## 2020-09-09 DIAGNOSIS — D649 Anemia, unspecified: Secondary | ICD-10-CM | POA: Diagnosis not present

## 2020-09-09 DIAGNOSIS — I10 Essential (primary) hypertension: Secondary | ICD-10-CM | POA: Diagnosis not present

## 2020-09-09 DIAGNOSIS — R790 Abnormal level of blood mineral: Secondary | ICD-10-CM

## 2020-09-09 DIAGNOSIS — E669 Obesity, unspecified: Secondary | ICD-10-CM

## 2020-09-09 LAB — CBC
HCT: 39.8 % (ref 36.0–46.0)
Hemoglobin: 13.6 g/dL (ref 12.0–15.0)
MCHC: 34.2 g/dL (ref 30.0–36.0)
MCV: 90.6 fl (ref 78.0–100.0)
Platelets: 308 K/uL (ref 150.0–400.0)
RBC: 4.4 Mil/uL (ref 3.87–5.11)
RDW: 13.8 % (ref 11.5–15.5)
WBC: 4.5 K/uL (ref 4.0–10.5)

## 2020-09-09 LAB — COMPREHENSIVE METABOLIC PANEL
ALT: 47 U/L — ABNORMAL HIGH (ref 0–35)
AST: 37 U/L (ref 0–37)
Albumin: 4.6 g/dL (ref 3.5–5.2)
Alkaline Phosphatase: 75 U/L (ref 39–117)
BUN: 13 mg/dL (ref 6–23)
CO2: 27 mEq/L (ref 19–32)
Calcium: 9.9 mg/dL (ref 8.4–10.5)
Chloride: 104 mEq/L (ref 96–112)
Creatinine, Ser: 0.79 mg/dL (ref 0.40–1.20)
GFR: 81.67 mL/min (ref >60.00)
Glucose, Bld: 85 mg/dL (ref 70–99)
Potassium: 3.9 mEq/L (ref 3.5–5.1)
Sodium: 141 mEq/L (ref 135–145)
Total Bilirubin: 0.4 mg/dL (ref 0.2–1.2)
Total Protein: 7 g/dL (ref 6.0–8.3)

## 2020-09-09 LAB — MAGNESIUM: Magnesium: 2 mg/dL (ref 1.5–2.5)

## 2020-09-09 NOTE — Patient Instructions (Signed)
Encouraged increased hydration and fiber in diet. Daily probiotics. If bowels not moving can use MOM 2 tbls po in 4 oz of warm prune juice by mouth every 2-3 days. If no results then repeat in 4 hours with  Dulcolax suppository pr, may repeat again in 4 more hours as needed. Seek care if symptoms worsen. Consider daily Miralax and/or Dulcolax if symptoms persist.   Consider mixing Benefiber with Miralax when you take it   Constipation, Adult Constipation is when a person has fewer than three bowel movements in a week, has difficulty having a bowel movement, or has stools (feces) that are dry, hard, or larger than normal. Constipation may be caused by an underlying condition. It may become worse with age if a person takes certain medicines and does not take in enough fluids. Follow these instructions at home: Eating and drinking  Eat foods that have a lot of fiber, such as beans, whole grains, and fresh fruits and vegetables.  Limit foods that are low in fiber and high in fat and processed sugars, such as fried or sweet foods. These include french fries, hamburgers, cookies, candies, and soda.  Drink enough fluid to keep your urine pale yellow.   General instructions  Exercise regularly or as told by your health care provider. Try to do 150 minutes of moderate exercise each week.  Use the bathroom when you have the urge to go. Do not hold it in.  Take over-the-counter and prescription medicines only as told by your health care provider. This includes any fiber supplements.  During bowel movements: ? Practice deep breathing while relaxing the lower abdomen. ? Practice pelvic floor relaxation.  Watch your condition for any changes. Let your health care provider know about them.  Keep all follow-up visits as told by your health care provider. This is important. Contact a health care provider if:  You have pain that gets worse.  You have a fever.  You do not have a bowel movement after  4 days.  You vomit.  You are not hungry or you lose weight.  You are bleeding from the opening between the buttocks (anus).  You have thin, pencil-like stools. Get help right away if:  You have a fever and your symptoms suddenly get worse.  You leak stool or have blood in your stool.  Your abdomen is bloated.  You have severe pain in your abdomen.  You feel dizzy or you faint. Summary  Constipation is when a person has fewer than three bowel movements in a week, has difficulty having a bowel movement, or has stools (feces) that are dry, hard, or larger than normal.  Eat foods that have a lot of fiber, such as beans, whole grains, and fresh fruits and vegetables.  Drink enough fluid to keep your urine pale yellow.  Take over-the-counter and prescription medicines only as told by your health care provider. This includes any fiber supplements. This information is not intended to replace advice given to you by your health care provider. Make sure you discuss any questions you have with your health care provider. Document Revised: 02/26/2019 Document Reviewed: 02/26/2019 Elsevier Patient Education  [place].

## 2020-09-09 NOTE — Progress Notes (Signed)
Patient ID: [name], female    DOB: 11/16/1960  Age: 60 y.o. MRN: [id number]    Subjective:  Subjective  HPI [name] presents for office visit today for follow up on recent gastric bypass surgery. She states that she is currently going through the treatments post-surgery. She reports that she cannot eat fast and endorses adding protein shakes, and meats in her diet. She denies any chest pain, SOB, fever, abdominal pain, cough, chills, sore throat, dysuria, urinary incontinence, back pain, HA, or N/VD. She states that she is starting an exercise program affiliated with [facility]. She reports that her BM's are often too big to pass and she states that it is a struggle for her, and as a result she states that she is seeing her GI tomorrow.    Review of Systems  Constitutional: Negative for chills, fatigue and fever.  HENT: Negative for congestion, rhinorrhea, sinus pressure, sinus pain and sore throat.   Eyes: Negative for pain.  Respiratory: Negative for cough and shortness of breath.   Cardiovascular: Negative for chest pain, palpitations and leg swelling.  Gastrointestinal: Negative for abdominal pain, blood in stool, diarrhea, nausea and vomiting.  Genitourinary: Negative for decreased urine volume, flank pain, frequency, vaginal bleeding and vaginal discharge.  Musculoskeletal: Negative for back pain.  Neurological: Negative for headaches.    History Past Medical History:  Diagnosis Date  . Acute bronchitis 05/25/2016  . Anxiety   . Arthritis   . Chronic pain syndrome 01/06/2013  . Chronic rhinosinusitis   . Colon polyps   . Cough 01/06/2013  . Depression   . Diabetes ([place]) 01/06/2013  . Diabetes mellitus type 2 in obese [facility]) 01/06/2013   Sees Dr Calvert Cantor for eye exam Does not see podiatry, foot exam today unremarkable except for thick cracking skin on heals  pt denies   . Dyspnea    with exertion   . Dysuria 05/25/2016  . Edema 01/06/2013  . Epistaxis 05/25/2016  .  Fibroid, uterine   . Fibromyalgia   . GERD (gastroesophageal reflux disease)   . Glaucoma 12-13  . Hoarseness   . Hoarseness of voice 05/11/2013  . Hyperglycemia 04/13/2013  . Hyperlipemia, mixed 06/06/2007   Qualifier: Diagnosis of  By: Wynona Luna She feels Lipitor caused increased low back pain and weakness    . Hyperlipidemia   . Hypertension   . Hypokalemia 02/05/2013  . IBS (irritable bowel syndrome) 02/13/2011  . Low back pain 03/03/2013  . Muscle cramp 05/22/2014  . Obesity   . Obesity, unspecified 05/11/2013  . OSA (obstructive sleep apnea)    cpap   . Pain in joint, shoulder region 06/27/2015  . Perimenopause 10/05/2012  . Pre-diabetes   . Raynaud disease 06/16/2013  . Thyroid disease    hypothyroidism  . 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   Her family history includes Alcohol abuse in her father; Anxiety disorder in her mother; Breast cancer (age of onset: 50) in an other family member; Cancer in her father and paternal grandfather; Depression in her mother; Diabetes in her brother, sister, and another family member; Heart disease in her father; High Cholesterol in her mother; High  blood pressure in her mother; Hyperlipidemia in her father; Hypertension in her father; Kidney disease in her mother; Obesity in her mother; Stroke in her father.She reports that she quit smoking about 15 years ago. Her smoking use included cigarettes. She has a 31.50 pack-year smoking history. She has never used smokeless tobacco. She reports that she does not drink alcohol and does not use  drugs.  Current Outpatient Medications on File Prior to Visit  Medication Sig Dispense Refill  . albuterol (VENTOLIN HFA) 108 (90 Base) MCG/ACT inhaler INHALE 2 PUFFS INTO THE LUNGS EVERY 4 (FOUR) HOURS AS NEEDED FOR WHEEZING OR SHORTNESS OF BREATH. 18 g 5  . busPIRone (BUSPAR) 7.5 MG tablet Take 1 tablet (7.5 mg total) by mouth 3 (three) times daily. 90 tablet 0  . Calcium Carbonate 500 MG CHEW Chew 500 mg by mouth daily at 12 noon.    Marland Kitchen Cod Liver Oil 1000 MG CAPS Take 1,000 mg by mouth daily.    . diphenhydrAMINE (BENADRYL) 25 MG tablet Take 25 mg by mouth every 6 (six) hours as needed.    . Evolocumab 140 MG/ML SOAJ INJECT 1 DOSE INTO THE SKIN EVERY 14 (FOURTEEN) DAYS. (Patient taking differently: Inject 140 mg into the skin every 14 (fourteen) days.) 6 mL 3  . furosemide (LASIX) 20 MG tablet Take 1 tablet (20 mg total) by mouth daily as needed. 90 tablet 1  . gabapentin (NEURONTIN) 100 MG capsule Take 3 capsules (300 mg total) by mouth at bedtime. 90 capsule 3  . ipratropium (ATROVENT) 0.06 % nasal spray Place 2 sprays into the nose daily.    Marland Kitchen loratadine (CLARITIN) 10 MG tablet Take 20 mg by mouth daily.    . metoprolol tartrate (LOPRESSOR) 25 MG tablet Take 1 tablet (25 mg total) by mouth 2 (two) times daily. 60 tablet 1  . Milnacipran (SAVELLA) 50 MG TABS tablet TAKE 1 TABLET (50 MG TOTAL) BY MOUTH 2 (TWO) TIMES DAILY. *START AFTER TITRATION PACK IS COMPLETE* 180 tablet 1  . modafinil (PROVIGIL) 200 MG tablet TAKE 2 TABLETS BY MOUTH DAILY 180 tablet 1  . Multiple Vitamins-Minerals (MULTIVITAMIN WITH MINERALS) tablet Take 1 tablet by mouth daily.    . pantoprazole (PROTONIX) 40 MG tablet Take 1 tablet (40 mg total) by mouth daily. 90 tablet 0  . ranolazine (RANEXA) 500 MG 12 hr tablet TAKE 1 TABLET (500 MG TOTAL) BY MOUTH 2 (TWO) TIMES DAILY. 60 tablet 3  . spironolactone (ALDACTONE) 50 MG tablet Take 1 tablet (50 mg total) by mouth 3 (three) times daily. 270 tablet 3  . thyroid (ARMOUR)  90 MG tablet TAKE 1 TABLET (90 MG TOTAL) BY MOUTH ONCE DAILY 90 tablet 3  . Vitamin D, Ergocalciferol, (DRISDOL) 1.25 MG (50000 UNIT) CAPS capsule TAKE 1 CAPSULE (50,000 UNITS TOTAL) BY MOUTH EVERY 7 (SEVEN) DAYS. 12 capsule 1  . [DISCONTINUED] DULoxetine (CYMBALTA) 30 MG capsule Take 1 capsule (30 mg total) by mouth daily. 15 capsule 0   No current facility-administered medications on file prior to visit.     Objective:  Objective  Physical Exam Constitutional:      General: She is not in acute distress.    Appearance: Normal appearance. She is not ill-appearing or toxic-appearing.  HENT:     Head: Normocephalic and atraumatic.     Right Ear: Tympanic membrane, ear canal and external ear normal.     Left Ear: Tympanic membrane, ear canal and external ear normal.     Nose: No  congestion or rhinorrhea.  Eyes:     Extraocular Movements: Extraocular movements intact.     Pupils: Pupils are equal, round, and reactive to light.  Cardiovascular:     Rate and Rhythm: Normal rate and regular rhythm.     Pulses: Normal pulses.     Heart sounds: Normal heart sounds. No murmur heard.   Pulmonary:     Effort: Pulmonary effort is normal. No respiratory distress.     Breath sounds: Normal breath sounds. No wheezing, rhonchi or rales.  Abdominal:     General: Bowel sounds are normal.     Palpations: Abdomen is soft. There is no mass.     Tenderness: There is no abdominal tenderness. There is no guarding.     Hernia: No hernia is present.  Musculoskeletal:        General: Normal range of motion.     Cervical back: Normal range of motion and neck supple.  Skin:    General: Skin is warm and dry.  Neurological:     Mental Status: She is alert and oriented to person, place, and time.  Psychiatric:        Behavior: Behavior normal.    BP 120/76   Pulse 93   Temp 98.5 F (36.9 C)   Resp 16   Wt 222 lb 9.6 oz (101 kg)   SpO2 98%   BMI 38.21 kg/m  Wt Readings from Last 3 Encounters:   09/09/20 222 lb 9.6 oz (101 kg)  08/26/20 232 lb 6.4 oz (105.4 kg)  08/10/20 239 lb 6.4 oz (108.6 kg)     Lab Results  Component Value Date   WBC 4.5 09/09/2020   HGB 13.6 09/09/2020   HCT 39.8 09/09/2020   PLT 308.0 09/09/2020   GLUCOSE 85 09/09/2020   CHOL 253 (H) 06/22/2020   CHOL 241 (H) 06/22/2020   TRIG 198 (H) 06/22/2020   TRIG 193 (H) 06/22/2020   HDL 41 06/22/2020   HDL 43 06/22/2020   LDLDIRECT 159.0 11/11/2019   LDLCALC 175 (H) 06/22/2020   LDLCALC 162 (H) 06/22/2020   ALT 47 (H) 09/09/2020   AST 37 09/09/2020   NA 141 09/09/2020   K 3.9 09/09/2020   CL 104 09/09/2020   CREATININE 0.79 09/09/2020   BUN 13 09/09/2020   CO2 27 09/09/2020   TSH 1.02 02/13/2020   HGBA1C 5.9 (H) 08/03/2020   MICROALBUR <0.7 08/19/2019    No results found.   Assessment & Plan:  Plan    No orders of the defined types were placed in this encounter.   Problem List Items Addressed This Visit    Hypothyroidism - Primary    She follows with endocrinology who manages      Hyperlipemia, mixed    She does not tolerate statins but is doing well on Repatha started at the Hyperlipidemia clinic.       Vitamin D deficiency   Obesity, unspecified    Good weight loss after gastric bypass surgery.      Hypertension    Well controlled, no changes to meds. Encouraged heart healthy diet such as the DASH diet and exercise as tolerated.       Diabetes ([place])    After what turns out to be sine false negative lows in the hospital due to her Raynauds she wore a dexcom from endocrinology for 2 weeks and she did not have any lows. She feels well and she has not had any concerning highs. No changes  today, continue to monitoring      Gastric bypass status for obesity    She is home from the hospital and she is doing well. She has had her first follow up with her surgeon and no concerns identified.        Other Visit Diagnoses    Low magnesium level       Relevant Orders   Magnesium  (Completed)   Hypocalcemia       Relevant Orders   Comprehensive metabolic panel (Completed)   Anemia, unspecified type       Relevant Orders   CBC (Completed)   Encounter for screening mammogram for malignant neoplasm of breast       Relevant Orders   MM 3D SCREEN BREAST BILATERAL      Follow-up: Return in about 3 months (around 12/10/2020).   I,David Hanna,acting as a scribe for Penni Homans, MD.,have documented all relevant documentation on the behalf of Penni Homans, MD,as directed by  Penni Homans, MD while in the presence of Penni Homans, MD.  I, Mosie Lukes, MD personally performed the services described in this documentation. All medical record entries made by the scribe were at my direction and in my presence. I have reviewed the chart and agree that the record reflects my personal performance and is accurate and complete

## 2020-09-10 ENCOUNTER — Telehealth: Payer: Self-pay

## 2020-09-10 DIAGNOSIS — K5904 Chronic idiopathic constipation: Secondary | ICD-10-CM | POA: Diagnosis not present

## 2020-09-10 DIAGNOSIS — Z006 Encounter for examination for normal comparison and control in clinical research program: Secondary | ICD-10-CM

## 2020-09-10 DIAGNOSIS — Z800 Family history of malignant neoplasm of digestive organs: Secondary | ICD-10-CM | POA: Diagnosis not present

## 2020-09-10 NOTE — Telephone Encounter (Signed)
I called patient for her 90-day Identify Study follow up phone call. Patient is doing well. Patient stated she was having some chest tightness but that has resolved. Patient followed up with her Cardiologist on 07/15/2020 for the chest tightness. I reminded patient I would call her in January for her 1 year follow-up.

## 2020-09-12 ENCOUNTER — Other Ambulatory Visit: Payer: Self-pay | Admitting: Cardiology

## 2020-09-12 NOTE — Assessment & Plan Note (Signed)
Good weight loss after gastric bypass surgery.

## 2020-09-12 NOTE — Assessment & Plan Note (Signed)
After what turns out to be sine false negative lows in the hospital due to her Raynauds she wore a dexcom from endocrinology for 2 weeks and she did not have any lows. She feels well and she has not had any concerning highs. No changes today, continue to monitoring

## 2020-09-12 NOTE — Assessment & Plan Note (Signed)
She does not tolerate statins but is doing well on Repatha started at the Hyperlipidemia clinic.

## 2020-09-12 NOTE — Assessment & Plan Note (Signed)
Well controlled, no changes to meds. Encouraged heart healthy diet such as the DASH diet and exercise as tolerated.  °

## 2020-09-12 NOTE — Assessment & Plan Note (Signed)
She follows with endocrinology who manages

## 2020-09-12 NOTE — Assessment & Plan Note (Signed)
She is home from the hospital and she is doing well. She has had her first follow up with her surgeon and no concerns identified.

## 2020-09-13 ENCOUNTER — Telehealth: Payer: Self-pay | Admitting: Pulmonary Disease

## 2020-09-13 ENCOUNTER — Other Ambulatory Visit (HOSPITAL_COMMUNITY): Payer: Self-pay

## 2020-09-13 MED ORDER — RANOLAZINE ER 500 MG PO TB12
500.0000 mg | ORAL_TABLET | Freq: Two times a day (BID) | ORAL | 2 refills | Status: DC
  Filled 2020-09-13: qty 180, 90d supply, fill #0

## 2020-09-14 ENCOUNTER — Ambulatory Visit (INDEPENDENT_AMBULATORY_CARE_PROVIDER_SITE_OTHER): Payer: 59 | Admitting: Psychiatry

## 2020-09-14 ENCOUNTER — Other Ambulatory Visit (HOSPITAL_COMMUNITY): Payer: Self-pay

## 2020-09-14 ENCOUNTER — Encounter (HOSPITAL_COMMUNITY): Payer: Self-pay | Admitting: Psychiatry

## 2020-09-14 ENCOUNTER — Other Ambulatory Visit: Payer: Self-pay

## 2020-09-14 VITALS — BP 148/88 | HR 102 | Temp 97.5°F | Ht 63.5 in | Wt 220.0 lb

## 2020-09-14 DIAGNOSIS — M797 Fibromyalgia: Secondary | ICD-10-CM | POA: Diagnosis not present

## 2020-09-14 DIAGNOSIS — F411 Generalized anxiety disorder: Secondary | ICD-10-CM | POA: Diagnosis not present

## 2020-09-14 MED ORDER — BUSPIRONE HCL 7.5 MG PO TABS
7.5000 mg | ORAL_TABLET | Freq: Two times a day (BID) | ORAL | 1 refills | Status: DC
  Filled 2020-09-14 – 2020-09-26 (×2): qty 60, 30d supply, fill #0

## 2020-09-14 NOTE — Progress Notes (Signed)
[facility] Follow up visit  Patient Identification: [name] MRN:  [id number] Date of Evaluation:  09/14/2020 Referral Source: Primary care Chief Complaint:  establish care, anxiety Visit Diagnosis:    ICD-10-CM   1. GAD (generalized anxiety disorder)  F41.1   2. Fibromyalgia  M79.7     History of Present Illness: Patient is a 60 years old female lives with her significant other initially referred by primary care physician for establish care for anxiety.  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  Does not have any kids   Has been on Ativan Xanax before, Effexor. She was having job stress related to seeing people dying of [place] that is causing anxiety and BuSpar was started last visit  She has gone through gastric bypass surgery 1 month ago has lost nearly 20 pounds as of now she is working on discipline and controlling intake follows up with her medical comorbidity including diabetes high cholesterol.  She feels she is doing better she is managing postsurgery and does not endorse hopelessness or depression  Also on Savella for fibromyalgia denies any significant aches and pains  She follows with Francie Massing for therapy   Her significant other relationship is going on well.     She uses a CPAP machine at night  Aggravating factors; job stress Modifying factors; significant other Duration since adult life  Severity fluctuates but recently has been noticing more anxiety and excessive worries      Past Psychiatric History: anxiety  Previous Psychotropic Medications: Yes  Effexor, xanax  Past Medical History:  Past Medical History:  Diagnosis Date  . Acute bronchitis 05/25/2016  . Anxiety   . Arthritis   . Chronic pain syndrome 01/06/2013  . Chronic rhinosinusitis   . Colon polyps   . Cough 01/06/2013  . Depression   . Diabetes ([place]) 01/06/2013  . Diabetes mellitus type 2 in obese [facility]) 01/06/2013   Sees Dr Calvert Cantor for eye exam Does  not see podiatry, foot exam today unremarkable except for thick cracking skin on heals  pt denies   . Dyspnea    with exertion   . Dysuria 05/25/2016  . Edema 01/06/2013  . Epistaxis 05/25/2016  . Fibroid, uterine   . Fibromyalgia   . GERD (gastroesophageal reflux disease)   . Glaucoma 12-13  . Hoarseness   . Hoarseness of voice 05/11/2013  . Hyperglycemia 04/13/2013  . Hyperlipemia, mixed 06/06/2007   Qualifier: Diagnosis of  By: Wynona Luna She feels Lipitor caused increased low back pain and weakness    . Hyperlipidemia   . Hypertension   . Hypokalemia 02/05/2013  . IBS (irritable bowel syndrome) 02/13/2011  . Low back pain 03/03/2013  . Muscle cramp 05/22/2014  . Obesity   . Obesity, unspecified 05/11/2013  . OSA (obstructive sleep apnea)    cpap   . Pain in joint, shoulder region 06/27/2015  . Perimenopause 10/05/2012  . Pre-diabetes   . Raynaud disease 06/16/2013  . Thyroid disease    hypothyroidism  . Vocal fold nodules     Past Surgical History:  Procedure Laterality Date  . ABDOMINAL HYSTERECTOMY    . BREAST EXCISIONAL BIOPSY Right    at age 52  . CHOLECYSTECTOMY    . colonoscopoy     . CYSTECTOMY    . DILATION AND CURETTAGE OF UTERUS     x2  . 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   Procedure: LAPAROSCOPIC ROUX-EN-Y GASTRIC BYPASS WITH  UPPER ENDOSCOPY;  Surgeon: [name], MD;  Location: [facility];  Service: General;  Laterality: N/A;  . I & D EXTREMITY Left 04/11/2018   Procedure: DEBIDMENT DISTAL INTERPHALANGEAL LEFT MIDDLE;  Surgeon: Daryll Brod, MD;  Location: [place];  Service: Orthopedics;  Laterality: Left;  . INCISIONAL HERNIA REPAIR  08/10/2020   Procedure: LAPAROSCOPIC PRIMARY REPAIR OF INCISIONAL HERNIA;  Surgeon: [name], MD;  Location: [facility];  Service: General;;  . MASS EXCISION Left 04/11/2018   Procedure: EXCISION MASS;  Surgeon: Daryll Brod, MD;  Location: [place];  Service: Orthopedics;  Laterality: Left;  .  OOPHORECTOMY    . POLYPECTOMY    . ROTATOR CUFF REPAIR    . UPPER GI ENDOSCOPY N/A 08/10/2020   Procedure: UPPER GI ENDOSCOPY;  Surgeon: [name], MD;  Location: [facility];  Service: General;  Laterality: N/A;    Family Psychiatric History: dad : alcohol use  Family History:  Family History  Problem Relation Age of Onset  . Alcohol abuse Father   . Cancer Father        renal and colon  . Hyperlipidemia Father   . Hypertension Father   . Stroke Father   . Heart disease Father   . Cancer Paternal Grandfather        colon  . Diabetes Other   . High blood pressure Mother   . High Cholesterol Mother   . Kidney disease Mother   . Depression Mother   . Anxiety disorder Mother   . Obesity Mother   . Breast cancer Other 45  . Diabetes Sister   . Diabetes Brother     Social History:   Social History   Socioeconomic History  . Marital status: Significant Other    Spouse name: Not on file  . Number of children: 0  . Years of education: Not on file  . Highest education level: Not on file  Occupational History  . 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 Smoking status: Former Smoker    Packs/day: 0.75    Years: 42.00    Pack years: 31.50    Types: Cigarettes    Quit date: 04/24/2005    Years since quitting: 15.4  . Smokeless tobacco: Never Used  . Tobacco comment: smoked since age 4  Vaping Use  . Vaping Use: Never used  Substance and Sexual Activity  . Alcohol use: No  . Drug use: Never  . Sexual activity: Yes    Partners: Male  Other Topics Concern  . Not on file  Social History Narrative   Endo-- Dr Dwyane Dee   ENT--Dr shoemaker   GI--Dr Conway   Pulm--Dr Clance   Rheum--Dr Charlestine Night         Social Determinants of Health   Financial Resource Strain: Not on file  Food Insecurity: Not on file  Transportation Needs: Not on file  Physical Activity: Not on file  Stress: Not on file  Social Connections: Not on file      Allergies:   Allergies   Allergen Reactions  . Crestor [Rosuvastatin] Other (See Comments)    Myalgias and rising CPK  . Losartan Cough  . Wellbutrin [Bupropion] Other (See Comments)    Dizziness and mental status changes  . Atorvastatin     myalgias  . Amoxicillin Rash  . Aspirin Nausea Only and Other (See Comments)    GI upset REACTION: Upset stomach  . Codeine Rash  . Erythromycin Rash and  Other (See Comments)  . Penicillins Rash    Reaction: 15 years    Metabolic Disorder Labs: Lab Results  Component Value Date   HGBA1C 5.9 (H) 08/03/2020   MPG 122.63 08/03/2020   MPG 137 02/13/2020   No results found for: PROLACTIN Lab Results  Component Value Date   CHOL 253 (H) 06/22/2020   CHOL 241 (H) 06/22/2020   TRIG 198 (H) 06/22/2020   TRIG 193 (H) 06/22/2020   HDL 41 06/22/2020   HDL 43 06/22/2020   CHOLHDL 6.2 (H) 06/22/2020   CHOLHDL 5.6 (H) 06/22/2020   VLDL 43.2 (H) 11/11/2019   LDLCALC 175 (H) 06/22/2020   LDLCALC 162 (H) 06/22/2020   Lab Results  Component Value Date   TSH 1.02 02/13/2020    Therapeutic Level Labs: No results found for: LITHIUM No results found for: CBMZ No results found for: VALPROATE  Current Medications: Current Outpatient Medications  Medication Sig Dispense Refill  . albuterol (VENTOLIN HFA) 108 (90 Base) MCG/ACT inhaler INHALE 2 PUFFS INTO THE LUNGS EVERY 4 (FOUR) HOURS AS NEEDED FOR WHEEZING OR SHORTNESS OF BREATH. 18 g 5  . busPIRone (BUSPAR) 7.5 MG tablet Take 1 tablet (7.5 mg total) by mouth 2 (two) times daily. 60 tablet 1  . Calcium Carbonate 500 MG CHEW Chew 500 mg by mouth daily at 12 noon.    Marland Kitchen Cod Liver Oil 1000 MG CAPS Take 1,000 mg by mouth daily.    . diphenhydrAMINE (BENADRYL) 25 MG tablet Take 25 mg by mouth every 6 (six) hours as needed.    . Evolocumab 140 MG/ML SOAJ INJECT 1 DOSE INTO THE SKIN EVERY 14 (FOURTEEN) DAYS. (Patient taking differently: Inject 140 mg into the skin every 14 (fourteen) days.) 6 mL 3  . furosemide (LASIX) 20 MG  tablet Take 1 tablet (20 mg total) by mouth daily as needed. 90 tablet 1  . gabapentin (NEURONTIN) 100 MG capsule Take 3 capsules (300 mg total) by mouth at bedtime. 90 capsule 3  . ipratropium (ATROVENT) 0.06 % nasal spray Place 2 sprays into the nose daily.    Marland Kitchen loratadine (CLARITIN) 10 MG tablet Take 20 mg by mouth daily.    . metoprolol tartrate (LOPRESSOR) 25 MG tablet Take 1 tablet (25 mg total) by mouth 2 (two) times daily. 60 tablet 1  . Milnacipran (SAVELLA) 50 MG TABS tablet TAKE 1 TABLET (50 MG TOTAL) BY MOUTH 2 (TWO) TIMES DAILY. *START AFTER TITRATION PACK IS COMPLETE* 180 tablet 1  . modafinil (PROVIGIL) 200 MG tablet TAKE 2 TABLETS BY MOUTH DAILY 180 tablet 1  . Multiple Vitamins-Minerals (MULTIVITAMIN WITH MINERALS) tablet Take 1 tablet by mouth daily.    . pantoprazole (PROTONIX) 40 MG tablet Take 1 tablet (40 mg total) by mouth daily. 90 tablet 0  . ranolazine (RANEXA) 500 MG 12 hr tablet Take 1 tablet (500 mg total) by mouth 2 (two) times daily. 180 tablet 2  . spironolactone (ALDACTONE) 50 MG tablet Take 1 tablet (50 mg total) by mouth 3 (three) times daily. 270 tablet 3  . thyroid (ARMOUR) 90 MG tablet TAKE 1 TABLET (90 MG TOTAL) BY MOUTH ONCE DAILY 90 tablet 3  . Vitamin D, Ergocalciferol, (DRISDOL) 1.25 MG (50000 UNIT) CAPS capsule TAKE 1 CAPSULE (50,000 UNITS TOTAL) BY MOUTH EVERY 7 (SEVEN) DAYS. 12 capsule 1   No current facility-administered medications for this visit.     Psychiatric Specialty Exam: Review of Systems  Psychiatric/Behavioral: Negative for agitation and self-injury.  Blood pressure (!) 148/88, pulse (!) 102, temperature (!) 97.5 F (36.4 C), height 5' 3.5" (1.613 m), weight 220 lb (99.8 kg), SpO2 98 %.Body mass index is 38.36 kg/m.  General Appearance: Casual  Eye Contact:  Fair  Speech:  Slow  Volume:  Decreased  Mood:  Euthymic  Affect:  Constricted  Thought Process:  Goal Directed  Orientation:  Full (Time, Place, and Person)  Thought  Content:  Rumination  Suicidal Thoughts:  No  Homicidal Thoughts:  No  Memory:  Immediate;   Fair Recent;   Fair  Judgement:  Fair  Insight:  Fair  Psychomotor Activity:  Decreased  Concentration:  Concentration: Fair and Attention Span: Fair  Recall:  [employer] of Knowledge:Fair  Language: Fair  Akathisia:  No  Handed:    AIMS (if indicated):  not done  Assets:  Desire for Improvement Financial Resources/Insurance  ADL's:  Intact  Cognition: WNL  Sleep:  Fair   Screenings: GAD-7   Flowsheet Row Office Visit from 03/22/2015 in [facility] at [employer]  Total GAD-7 Score 0    PHQ2-9   [place] Visit from 09/09/2020 in [place] [facility] [place] from [facility]/24/2020 in [facility] Video Visit from 06/29/2020 in [place] [facility] from 03/02/2020 in [facility] Office Visit from 07/03/2017 in [place]  PHQ-2 Total Score 0 0 1 0 0  PHQ-9 Total Score 0 -- -- -- 4    [place] Office Visit from 09/14/2020 in [place] Admission (Discharged) from 08/10/2020 in [facility] Video Visit from 06/29/2020 in [place] No Risk No Risk No Risk      Assessment and Plan:  As follows Prior documentation reviewed  GAD: taking savella for fibromyalgia,; anxiety somewhat better on buspar, can continue bid, still worries of medical co morbidity, continue therapy with Francie Massing and is going thru post srurgery follow ups for gastric bypass  Denies signficant depression or suicidal thoughts  Discussed ME time and distractions  Fibromyalgia. Fair on savella, continue therapy   Fu 5 weeks or earlier  Seen in office 20 minutes face to face   Merian Capron, MD 5/24/202211:14 AM

## 2020-09-15 ENCOUNTER — Ambulatory Visit: Payer: 59 | Admitting: Professional

## 2020-09-15 NOTE — Telephone Encounter (Signed)
I have spoke with [name] and her LCS CT has been scheduled for 10/22/20 @ 1:30pm at [place] location and she is aware

## 2020-09-16 ENCOUNTER — Other Ambulatory Visit (HOSPITAL_COMMUNITY): Payer: Self-pay

## 2020-09-17 ENCOUNTER — Ambulatory Visit (INDEPENDENT_AMBULATORY_CARE_PROVIDER_SITE_OTHER): Payer: 59 | Admitting: Professional

## 2020-09-17 ENCOUNTER — Other Ambulatory Visit (HOSPITAL_COMMUNITY): Payer: Self-pay

## 2020-09-17 DIAGNOSIS — F411 Generalized anxiety disorder: Secondary | ICD-10-CM

## 2020-09-23 ENCOUNTER — Ambulatory Visit: Payer: 59 | Admitting: Professional

## 2020-09-23 DIAGNOSIS — H40013 Open angle with borderline findings, low risk, bilateral: Secondary | ICD-10-CM | POA: Diagnosis not present

## 2020-09-26 MED FILL — Evolocumab Subcutaneous Soln Auto-Injector 140 MG/ML: SUBCUTANEOUS | 84 days supply | Qty: 6 | Fill #0 | Status: AC

## 2020-09-27 ENCOUNTER — Other Ambulatory Visit (HOSPITAL_COMMUNITY): Payer: Self-pay

## 2020-09-28 ENCOUNTER — Ambulatory Visit: Payer: 59 | Admitting: Family Medicine

## 2020-09-29 ENCOUNTER — Ambulatory Visit (INDEPENDENT_AMBULATORY_CARE_PROVIDER_SITE_OTHER): Payer: 59 | Admitting: Professional

## 2020-09-29 DIAGNOSIS — Z1231 Encounter for screening mammogram for malignant neoplasm of breast: Secondary | ICD-10-CM | POA: Diagnosis not present

## 2020-09-29 DIAGNOSIS — F411 Generalized anxiety disorder: Secondary | ICD-10-CM | POA: Diagnosis not present

## 2020-10-01 LAB — HM MAMMOGRAPHY

## 2020-10-05 ENCOUNTER — Ambulatory Visit: Payer: 59 | Admitting: Skilled Nursing Facility1

## 2020-10-07 ENCOUNTER — Other Ambulatory Visit: Payer: Self-pay

## 2020-10-11 ENCOUNTER — Other Ambulatory Visit (HOSPITAL_COMMUNITY): Payer: Self-pay

## 2020-10-11 ENCOUNTER — Encounter: Payer: Self-pay | Admitting: Family Medicine

## 2020-10-11 ENCOUNTER — Telehealth: Payer: Self-pay

## 2020-10-11 DIAGNOSIS — E782 Mixed hyperlipidemia: Secondary | ICD-10-CM | POA: Diagnosis not present

## 2020-10-11 LAB — LIPID PANEL
Chol/HDL Ratio: 1.8 ratio (ref 0.0–4.4)
Cholesterol, Total: 77 mg/dL — ABNORMAL LOW (ref 100–199)
HDL: 43 mg/dL (ref >39)
LDL Chol Calc (NIH): 17 mg/dL (ref 0–99)
Triglycerides: 80 mg/dL (ref 0–149)
VLDL Cholesterol Cal: 17 mg/dL (ref 5–40)

## 2020-10-11 MED ORDER — METOPROLOL TARTRATE 25 MG PO TABS
25.0000 mg | ORAL_TABLET | Freq: Two times a day (BID) | ORAL | 0 refills | Status: DC
  Filled 2020-10-11: qty 180, 90d supply, fill #0

## 2020-10-11 NOTE — Telephone Encounter (Signed)
Patient notified that rx has been sent in. 

## 2020-10-11 NOTE — Telephone Encounter (Signed)
Are you ok with refilling?

## 2020-10-11 NOTE — Addendum Note (Signed)
Addended by: Kem Boroughs D on: 10/11/2020 01:36 PM   Modules accepted: Orders

## 2020-10-11 NOTE — Telephone Encounter (Signed)
Pt called stating her heart rate has been up (even resting) on Lopressor.  Dr. [name] who did her gastric bypass surgery put her on this medication advised the pt to reach out to her PCP regarding this for advice. Pt stated she will be out of this medication by Friday of this week with no future refills.  Pt is requesting a call back and please call her home phone [phone].

## 2020-10-13 ENCOUNTER — Encounter: Payer: 59 | Attending: General Surgery | Admitting: Skilled Nursing Facility1

## 2020-10-13 ENCOUNTER — Other Ambulatory Visit: Payer: Self-pay

## 2020-10-13 DIAGNOSIS — E669 Obesity, unspecified: Secondary | ICD-10-CM | POA: Diagnosis not present

## 2020-10-13 DIAGNOSIS — E1169 Type 2 diabetes mellitus with other specified complication: Secondary | ICD-10-CM | POA: Diagnosis not present

## 2020-10-13 NOTE — Progress Notes (Signed)
Bariatric Nutrition Follow-Up Visit Medical Nutrition Therapy   2 Months Post-Operative RYGB Surgery Surgery Date: 08/10/2020   NUTRITION ASSESSMENT    Anthropometrics  Surgery date: 08/10/2020 Surgery type: RYGB Start weight at [facility]: 254.9 Weight today: 232.4 lbs   Body Composition Scale 10/13/2020  Weight  lbs 212.1  Total Body Fat  % 42.6     Visceral Fat 14  Fat-Free Mass  % 57.3     Total Body Water  % 43.1     Muscle-Mass  lbs 29.7  BMI 35.8  Body Fat Displacement ---        Torso  lbs 55.9        Left Leg  lbs 11.1        Right Leg  lbs 11.1        Left Arm  lbs 5.5        Right Arm  lbs 5.5   Clinical  Medical hx: HTN, OSA, GERD, IBS, Hypothyroidism, diabetes Medications: metformin  Labs: A1C 6.4 Notable signs/symptoms: sweating, edema, hypokalemia, cannot stand or walk for long time Any previous deficiencies? Vitamin D   Lifestyle & Dietary Hx  Pt state she is constipated but does not take mirilax daily due to work. Pt states she has been struggling with eating throughout the day at work. Pt state she works 3 days a week. Pt is a poor historian making it difficult to know what and how much she is eating. Pt state she does not have much of an appetite. Pt state she thinks if she can eat vegetables she will do better: dietitian asked if she had no appetite why would adding vegetables help to increase eating: pt state she really does not know; after further reflexion pt states she is worried about eating things she is not supposed to.  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  Estimated daily fluid intake: 40-50 oz Estimated daily protein intake: 100+ g Supplements: multi and calcium  Current average weekly physical activity: BELT   24-Hr Dietary Recall First Meal 5am: chicken salad + protein water Snack:  pork rinds Second Meal: peanuts Snack:  cheese Third  Meal: fried chicken liver + grilled chicken + green beans or [place] necks Snack: sugar free fudge pop Beverages: 2-3 protein water, decaf coffee + splenda + fat free creamer, 2 protien shake (slim fast), water, splenda + green tea  Post-Op Goals/ Signs/ Symptoms Using straws: no Drinking while eating: no Chewing/swallowing difficulties: no Changes in vision: no Changes to mood/headaches: no Hair loss/changes to skin/nails: no Difficulty focusing/concentrating: no Sweating: no Dizziness/lightheadedness: no Palpitations: no  Carbonated/caffeinated beverages: no N/V/D/C/Gas: no Abdominal pain: no Dumping syndrome: no    NUTRITION DIAGNOSIS  Overweight/obesity ([place]-3.3) related to past poor dietary habits and physical inactivity as evidenced by completed bariatric surgery and following dietary guidelines for continued weight loss and healthy nutrition status.     NUTRITION INTERVENTION Nutrition counseling (C-1) and education (E-2) to facilitate bariatric surgery goals, including: Diet advancement to the next phase (phase 4) now including non starchy vegetables  The importance of consuming adequate calories as well as certain nutrients daily due to the body's need for essential vitamins, minerals, and fats The importance of daily physical activity and to reach a goal of at least 150 minutes of moderate to vigorous physical activity weekly (or as directed by their physician)  due to benefits such as increased musculature and improved lab values The importance of intuitive eating specifically learning hunger-satiety cues and understanding the importance of learning a new body: The importance of mindful eating to avoid grazing behaviors  Goals: -limit your protein water/shake to 1 a day  -Continue to aim for a minimum of 64 fluid ounces 7 days a week with at least 30 ounces being plain water  -Eat non-starchy vegetables 2 times a day 7 days a week  -Start out with soft cooked vegetables  today and tomorrow; if tolerated begin to eat raw vegetables or cooked including salads  -Eat your 3 ounces of protein first then start in on your non-starchy vegetables; once you understand how much of your meal leads to satisfaction and not full while still eating 3 ounces of protein and non-starchy vegetables you can eat them in any order   -Continue to aim for 30 minutes of activity at least 5 times a week  -Do NOT cook with/add to your food: alfredo sauce, cheese sauce, barbeque sauce, ketchup, fat back, butter, bacon grease, grease, Crisco, OR SUGAR  -Breakfast between 8-10 am, lunch 12-2 pm, dinner: 6 pm   Handouts Provided Include  Phase 4  Learning Style & Readiness for Change Teaching method utilized: Visual & Auditory  Demonstrated degree of understanding via: Teach Back  Readiness Level: action Barriers to learning/adherence to lifestyle change: work schedule  RD's Notes for Next Visit Assess adherence to pt chosen goals    MONITORING & EVALUATION Dietary intake, weekly physical activity, body weight  Next Steps Patient is to follow-up in 3-4 months

## 2020-10-14 ENCOUNTER — Encounter: Payer: Self-pay | Admitting: Physical Therapy

## 2020-10-14 ENCOUNTER — Ambulatory Visit: Payer: 59 | Attending: Physician Assistant | Admitting: Physical Therapy

## 2020-10-14 DIAGNOSIS — M545 Low back pain, unspecified: Secondary | ICD-10-CM | POA: Diagnosis not present

## 2020-10-14 DIAGNOSIS — R252 Cramp and spasm: Secondary | ICD-10-CM

## 2020-10-14 DIAGNOSIS — R279 Unspecified lack of coordination: Secondary | ICD-10-CM | POA: Diagnosis not present

## 2020-10-14 DIAGNOSIS — M6281 Muscle weakness (generalized): Secondary | ICD-10-CM | POA: Diagnosis not present

## 2020-10-14 DIAGNOSIS — G8929 Other chronic pain: Secondary | ICD-10-CM

## 2020-10-14 NOTE — Patient Instructions (Signed)
Access Code: [email or web address] URL: [email or web address] Date: 10/14/2020 Prepared by: Jari Favre  Exercises Supine Diaphragmatic Breathing - 3 x daily - 7 x weekly - 1 sets - 10 reps Supine Diaphragmatic Breathing with Pelvic Floor Lengthening - 3 x daily - 7 x weekly - 1 sets - 10 reps  Patient Education Trigger Point Dry Needling

## 2020-10-14 NOTE — Therapy (Signed)
[facility] [address] [place], [place], [address] Phone: [phone]   Fax:  [phone]  Physical Therapy Evaluation  Patient Details  Name: [name] MRN: [id number] Date of Birth: 1960-12-07 Referring Provider (PT): Vladimir Crofts, [place]   Encounter Date: 10/14/2020   PT End of Session - 10/14/20 0946     Visit Number 1    Date for PT Re-Evaluation 01/06/21    Authorization Type [employer] employee    PT Start Time 0932    PT Stop Time 1012    PT Time Calculation (min) 40 min    Activity Tolerance Patient tolerated treatment well    Behavior During Therapy [facility] for tasks assessed/performed             Past Medical History:  Diagnosis Date   Acute bronchitis 05/25/2016   Anxiety    Arthritis    Chronic pain syndrome 01/06/2013   Chronic rhinosinusitis    Colon polyps    Cough 01/06/2013   Depression    Diabetes ([place]) 01/06/2013   Diabetes mellitus type 2 in obese [facility]) 01/06/2013   Sees Dr Calvert Cantor for eye exam Does not see podiatry, foot exam today unremarkable except for thick cracking skin on heals  pt denies    Dyspnea    with exertion    Dysuria 05/25/2016   Edema 01/06/2013   Epistaxis 05/25/2016   Fibroid, uterine    Fibromyalgia    GERD (gastroesophageal reflux disease)    Glaucoma 12-13   Hoarseness    Hoarseness of voice 05/11/2013   Hyperglycemia 04/13/2013   Hyperlipemia, mixed 06/06/2007   Qualifier: Diagnosis of  By: Wynona Luna She feels Lipitor caused increased low back pain and weakness     Hyperlipidemia    Hypertension    Hypokalemia 02/05/2013   IBS (irritable bowel syndrome) 02/13/2011   Low back pain 03/03/2013   Muscle cramp 05/22/2014   Obesity    Obesity, unspecified 05/11/2013   OSA (obstructive sleep apnea)    cpap    Pain in joint, shoulder region 06/27/2015   Perimenopause 10/05/2012   Pre-diabetes    Raynaud disease 06/16/2013   Thyroid disease     hypothyroidism   Vocal fold nodules     Past Surgical History:  Procedure Laterality Date   ABDOMINAL HYSTERECTOMY     BREAST EXCISIONAL BIOPSY Right    at age 60   CHOLECYSTECTOMY     colonoscopoy      [place]     x2   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   Procedure: LAPAROSCOPIC ROUX-EN-Y GASTRIC BYPASS WITH UPPER ENDOSCOPY;  Surgeon: [name], MD;  Location: [facility];  Service: General;  Laterality: N/A;   I & D EXTREMITY Left 04/11/2018   Procedure: DEBIDMENT DISTAL INTERPHALANGEAL LEFT MIDDLE;  Surgeon: Daryll Brod, MD;  Location: [facility];  Service: Orthopedics;  Laterality: Left;   INCISIONAL HERNIA REPAIR  08/10/2020   Procedure: LAPAROSCOPIC PRIMARY REPAIR OF INCISIONAL HERNIA;  Surgeon: [name], MD;  Location: [facility];  Service: General;;   MASS EXCISION Left 04/11/2018   Procedure: EXCISION MASS;  Surgeon: Daryll Brod, MD;  Location: [facility];  Service: Orthopedics;  Laterality: Left;   OOPHORECTOMY     POLYPECTOMY     ROTATOR CUFF REPAIR     UPPER GI ENDOSCOPY N/A 08/10/2020   Procedure: UPPER GI ENDOSCOPY;  Surgeon: [name], MD;  Location: [facility];  Service: General;  Laterality: N/A;    There were no vitals filed for this visit.    Subjective Assessment - 10/14/20 0935     Subjective I have to push so hard and don't have the strength.  I had gastric bypass surgery.  Pt states she also has urinary leakge if sick or cough a lot. Denies using pads at this time.  Pt states she has never had a normal BM.  Pt states she is taking mirilax and trulance.    Pertinent History colon polyps, anxiety/depression, GERD, dysuria, hysterectomy, cholecystectomy, incisional hernia repair, gastric bypass surgery    Patient Stated Goals Be able to have BM    Currently in Pain? No/denies                [facility] PT Assessment - 10/14/20 0001       Assessment   Medical Diagnosis K59.00 (ICD-10-CM) -  Constipation, unspecified    Referring Provider (PT) Vladimir Crofts, PA-C    Onset Date/Surgical Date --   August 10, 2020   Prior Therapy No      Precautions   Precautions None      Balance Screen   Has the patient fallen in the past 6 months No      [place] residence    Living Arrangements Spouse/significant other      Prior Function   Level of Independence Independent    Vocation Full time employment   watch heart monitors   Vocation Requirements sitting/standing - watch heart monitors      Cognition   Overall Cognitive Status Within Functional Limits for tasks assessed      Posture/Postural Control   Posture/Postural Control Postural limitations    Postural Limitations Rounded Shoulders;Increased lumbar lordosis      Palpation   Palpation comment tight across lumbar region and abdomen                        Objective measurements completed on examination: See above findings.     Pelvic Floor Special Questions - 10/14/20 0001     Prior Pelvic/Prostate Exam Yes    Are you Pregnant or attempting pregnancy? --   menopaus- hysterectomy   Prior Pregnancies No    Currently Sexually Active No    Urinary Leakage Yes    How often coughing a lot    Activities that cause leaking Coughing    Urinary urgency Yes    Urinary frequency sometimes i can go all day without peeing, but sometimes I can barely hold    Fluid intake 60 oz    Skin Integrity Intact;Hemorroids    External Palpation TTP througout but levators more than cavernosis muscles    Prolapse --   none noticed in supine   Pelvic Floor Internal Exam pt identity confirmed and informed consent given to perform internal soft tissue assessment    Exam Type Rectal    Palpation normal initially but tightens with any movmeent and unable to relax well; TTP puborectalis    Strength fair squeeze, definite lift    Strength # of seconds 5              [facility] Adult PT  Treatment/Exercise - 10/14/20 0001       Self-Care   Self-Care Other Self-Care Comments    Other Self-Care Comments  educated and performed diaphragmatic breathing  Trigger Point Dry Needling - 10/14/20 0001     Education Handout Provided Yes                  PT Education - 10/14/20 1015     Education Details Access Code: [email or web address]    Person(s) Educated Patient    Methods Explanation;Demonstration;Tactile cues;Verbal cues;Handout    Comprehension Verbalized understanding;Returned demonstration              PT Short Term Goals - 10/14/20 1221       PT SHORT TERM GOAL #1   Title able to bulge pelvic floor    Time 4    Period Weeks    Status New    Target Date 11/11/20      PT SHORT TERM GOAL #2   Title pt will report at least 20% less straining    Time 6    Period Weeks    Status New    Target Date 11/25/20               PT Long Term Goals - 10/14/20 1217       PT LONG TERM GOAL #1   Title Pt will report BM at least every 3 days without straining or bearing down    Time 12    Period Weeks    Status New    Target Date 01/06/21      PT LONG TERM GOAL #2   Title Pt will improve lumbar soft tissue mobility for improved low back pain    Time 12    Period Weeks    Status New    Target Date 01/06/21      PT LONG TERM GOAL #3   Title Pt will imporve pelvic floor strength to at least 3/5 and holding for 10 seconds due to improved coordination without breath holding    Time 12    Period Weeks    Status New    Target Date 01/06/21      PT LONG TERM GOAL #4   Title Pt will be ind with HEP to maintain improvements and functional level    Time 12    Period Weeks    Status New    Target Date 01/06/21                    Plan - 10/14/20 0958     Clinical Impression Statement Pt presents to clinic due to chronic constipation, but has a very complex history and much of the assessment was used to gather history.  Pt has  uncoordinated pelvic floor and unable to bulge and is tightening with inhaling.  Pt has 3/5 MMTand after tightening muscles taking a minute or more to relax so unable to do quick flicks.  Pt has fascial restrictions in abdomen from multiple surgerys and tight muscles in low back.  There was not enough time to complete strength and ROM assessment today.  Due to patient complicated medical history and impairments listed above, she is expected to benefit from skilled PT to address her functional limitations    Personal Factors and Comorbidities Comorbidity 3+    Comorbidities colon polyps, anxiety/depression, GERD, dysuria, hysterectomy, cholecystectomy, incisional hernia    Examination-Activity Limitations Continence;Toileting    Examination-Participation Restrictions Community Activity    Stability/Clinical Decision Making Evolving/Moderate complexity    Clinical Decision Making Moderate    Rehab Potential Excellent    PT Frequency 1x / week    PT Duration 12  weeks    PT Treatment/Interventions ADLs/Self Care Home Management;Biofeedback;Cryotherapy;Software engineer;Therapeutic activities;Therapeutic exercise;Neuromuscular re-education;Patient/family education;Manual techniques;Taping;Dry needling;Passive range of motion    PT Next Visit Plan abdominal fascial release; lumbar release, discussed dry needling    PT Home Exercise Plan Access Code: [email or web address]    Consulted and Agree with Plan of Care Patient             Patient will benefit from skilled therapeutic intervention in order to improve the following deficits and impairments:  Increased muscle spasms, Pain, Postural dysfunction, Decreased endurance, Decreased coordination, Decreased strength  Visit Diagnosis: Cramp and spasm  Chronic low back pain, unspecified back pain laterality, unspecified whether sciatica present  Muscle weakness (generalized)  Unspecified lack of coordination     Problem  List Patient Active Problem List   Diagnosis Date Noted   Gastric bypass status for obesity 08/11/2020   Chronic idiopathic constipation 07/15/2020   Severe chronic obstructive pulmonary disease ([place]) 05/26/2020   Preoperative cardiovascular examination 05/13/2020   Mild CAD 05/13/2020   Prediabetes 05/13/2020   Depression    Anal pain 04/12/2020   Dysphonia 04/12/2020   Epigastric pain 04/12/2020   Family history of malignant neoplasm of gastrointestinal tract 04/12/2020   Left lower quadrant pain 04/12/2020   Pruritus ani 04/12/2020   Pure hypercholesterolemia 04/12/2020   Thoracic and lumbosacral neuritis 04/12/2020   Vocal fold nodules    Thyroid disease    Hypertension    GERD (gastroesophageal reflux disease)    Fibroid, uterine    Depressive disorder    Colon polyps    Chronic rhinosinusitis    Anxiety    Hemorrhoids 02/22/2020   Statin intolerance 12/28/2019   Elevated CK 11/12/2019   Lymphadenopathy 11/12/2019   Neck pain 11/04/2019   Cervical radiculopathy 05/19/2019   Numbness and tingling 04/17/2019   Tachycardia 67/61/9509   Umbilical hernia 32/67/1245   Sun-damaged skin 01/19/2019   Preventative health care 01/19/2019   Right arm pain 01/16/2019   Numbness 10/11/2018   Carpal tunnel syndrome of right wrist 09/04/2018   Entrapment of right ulnar nerve 09/04/2018   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/26/2018   Bilateral hand pain 01/30/2018   Mucoid cyst, joint 01/30/2018   Nodule of finger of both hands 01/10/2018   Weakness 07/03/2017   Shortness of breath on exertion 07/03/2017   Osteoarthritis of spine with radiculopathy, cervical region 07/24/2016   Dysuria 05/25/2016   Epistaxis 05/25/2016   Hypersomnia with sleep apnea 02/18/2016   Severe obesity (BMI >= 40) ([place]) 02/18/2016   Thyroid activity decreased 07/30/2015   Left shoulder pain 06/27/2015   Pain in joint, shoulder region 06/27/2015   Myalgia 03/27/2015   Muscle cramp 05/22/2014    Mass of soft tissue of neck 02/23/2014   AP (abdominal pain) 02/15/2014   Raynaud disease 06/16/2013   Hoarseness of voice 05/11/2013   Obesity, unspecified 05/11/2013   Hyperglycemia 04/13/2013   Backache 03/03/2013   Low back pain 03/03/2013   Hypokalemia 02/05/2013   Edema 01/06/2013   Diabetes mellitus type 2 in obese ([place]) 01/06/2013   Chronic pain syndrome 01/06/2013   Cough 01/06/2013   Diabetes ([place]) 01/06/2013   Perimenopause 10/05/2012   Glaucoma    SOB (shortness of breath) 09/10/2011   Slow transit constipation 02/13/2011   Vitamin D deficiency 02/13/2011   IBS (irritable bowel syndrome) 02/13/2011   Paraesthesia of skin 08/31/2010   RHINITIS, CHRONIC 01/14/2010   Atypical chest pain 06/16/2008   THYROMEGALY 11/11/2007  Allergic rhinitis 11/11/2007   ECZEMA, HANDS 07/22/2007   Hypothyroidism 06/06/2007   Hyperlipemia, mixed 06/06/2007   ADJ DISORDER WITH MIXED ANXIETY & DEPRESSED MOOD 06/06/2007   OSA (obstructive sleep apnea) 06/06/2007   Benign essential hypertension 06/06/2007   COLONIC POLYPS, HX OF 06/06/2007    Jule Ser, PT 10/14/2020, 12:26 PM  [facility] [address], [place], [place], [address] Phone: [phone]   Fax:  [phone]  Name: [name] MRN: [id number] Date of Birth: 01/12/1961

## 2020-10-15 ENCOUNTER — Ambulatory Visit (INDEPENDENT_AMBULATORY_CARE_PROVIDER_SITE_OTHER): Payer: 59 | Admitting: Professional

## 2020-10-15 DIAGNOSIS — F411 Generalized anxiety disorder: Secondary | ICD-10-CM

## 2020-10-18 ENCOUNTER — Ambulatory Visit: Payer: 59 | Admitting: Acute Care

## 2020-10-18 ENCOUNTER — Encounter: Payer: Self-pay | Admitting: Acute Care

## 2020-10-18 ENCOUNTER — Other Ambulatory Visit: Payer: Self-pay

## 2020-10-18 ENCOUNTER — Encounter: Payer: Self-pay | Admitting: Physical Therapy

## 2020-10-18 ENCOUNTER — Ambulatory Visit: Payer: 59 | Admitting: Physical Therapy

## 2020-10-18 VITALS — BP 122/52 | Temp 97.6°F | Ht 64.5 in | Wt 210.6 lb

## 2020-10-18 DIAGNOSIS — M545 Low back pain, unspecified: Secondary | ICD-10-CM

## 2020-10-18 DIAGNOSIS — R252 Cramp and spasm: Secondary | ICD-10-CM

## 2020-10-18 DIAGNOSIS — Z87891 Personal history of nicotine dependence: Secondary | ICD-10-CM | POA: Diagnosis not present

## 2020-10-18 DIAGNOSIS — R279 Unspecified lack of coordination: Secondary | ICD-10-CM | POA: Diagnosis not present

## 2020-10-18 DIAGNOSIS — M6281 Muscle weakness (generalized): Secondary | ICD-10-CM | POA: Diagnosis not present

## 2020-10-18 DIAGNOSIS — G4733 Obstructive sleep apnea (adult) (pediatric): Secondary | ICD-10-CM | POA: Diagnosis not present

## 2020-10-18 DIAGNOSIS — G8929 Other chronic pain: Secondary | ICD-10-CM | POA: Diagnosis not present

## 2020-10-18 NOTE — Progress Notes (Signed)
History of Present Illness [name] is a 60 y.o. female  former smoker, Quit 2007 with obstructive sleep apnea and CPAP use.She takes Provigil 400 mg daily. She is followed by Dr. Halford Chessman.     10/18/2020 Pt. Presents for her annual follow up of OSA and CPAP use. . She states he has been doing well. She has excellent compliance with her CPAP, down load confirms 100% compliance AHI is 0.3. She is wearing her devise for at least 7 hours per night.  She states she thinks she needs a new machine. Her machine is 60 years old. We discussed that there is a shortage of machines, and she will most likely have to wait a significant period of time for a new Resmed machine. She is ok waiting for a new machine with new equipment. She understands the situation.  She has had her gastric bypass surgery, 07/2020. She has lost 40 pounds. She feels much better. Cholesterol is down and she is much more active. She states eating is very different . She was just doing proteins. She is introducing vegetables into her diet now. She is doing a great job . She is doing the 16 week exercise program post surgery at [facility]. She looks great. I told her we are very proud of her.  No daytime sleepiness, no morning headaches. She has an adequate amount of her Provigil. She will call for a refill as needed. She has her annual low dose CT for Lung Cancer Screening 10/22/2020. This will be her last scan as she has been smoke free for 15 years.    Test Results: CPAP Down Load 5/25-6/23/2022     Sleep tests: PSG 08/12/06 >> AHI 5, SpO2 85% HST 2013 >> AHI 12 Auto CPAP 06/1317 to 08/02/17 >> used on 30 of 30 nights with average 8 hrs 16 hrs.  Average AHI 0.9 with median CPAP 10 and 95 th percentile CPAP 14 cm H2O   Cardiac tests: Echo 01/21/16 >> mild LVH, EF 55 to 60%, grade 1 DD   Pulmonary tests: PFT 02/25/16 >> FEV1 2.36 (107%), FEV1% 88, TLC 4.02 (79%), DLCO 76%  CBC Latest Ref Rng & Units 09/09/2020 08/14/2020 08/12/2020   WBC 4.0 - 10.5 K/uL 4.5 5.6 10.0  Hemoglobin 12.0 - 15.0 g/dL 13.6 11.8(L) 13.2  Hematocrit 36.0 - 46.0 % 39.8 36.0 40.2  Platelets 150.0 - 400.0 K/uL 308.0 275 335    BMP Latest Ref Rng & Units 09/09/2020 08/16/2020 08/15/2020  Glucose 70 - 99 mg/dL 85 99 83  BUN 6 - 23 mg/dL 13 8 -  Creatinine 0.40 - 1.20 mg/dL 0.79 0.85 -  BUN/Creat Ratio 9 - 23 - - -  Sodium 135 - 145 mEq/L 141 140 -  Potassium 3.5 - 5.1 mEq/L 3.9 3.9 -  Chloride 96 - 112 mEq/L 104 108 -  CO2 19 - 32 mEq/L 27 25 -  Calcium 8.4 - 10.5 mg/dL 9.9 8.9 -    BNP No results found for: BNP  ProBNP No results found for: PROBNP  PFT    Component Value Date/Time   FEV1PRE 2.24 02/25/2016 1100   FEV1POST 2.36 02/25/2016 1100   FVCPRE 2.63 02/25/2016 1100   FVCPOST 2.67 02/25/2016 1100   TLC 4.02 02/25/2016 1100   DLCOUNC 18.63 02/25/2016 1100   PREFEV1FVCRT 85 02/25/2016 1100   PSTFEV1FVCRT 88 02/25/2016 1100    No results found.   Past medical hx Past Medical History:  Diagnosis Date   Acute  bronchitis 05/25/2016   Anxiety    Arthritis    Chronic pain syndrome 01/06/2013   Chronic rhinosinusitis    Colon polyps    Cough 01/06/2013   Depression    Diabetes ([place]) 01/06/2013   Diabetes mellitus type 2 in obese [facility]) 01/06/2013   Sees Dr Calvert Cantor for eye exam Does not see podiatry, foot exam today unremarkable except for thick cracking skin on heals  pt denies    Dyspnea    with exertion    Dysuria 05/25/2016   Edema 01/06/2013   Epistaxis 05/25/2016   Fibroid, uterine    Fibromyalgia    GERD (gastroesophageal reflux disease)    Glaucoma 12-13   Hoarseness    Hoarseness of voice 05/11/2013   Hyperglycemia 04/13/2013   Hyperlipemia, mixed 06/06/2007   Qualifier: Diagnosis of  By: Wynona Luna She feels Lipitor caused increased low back pain and weakness     Hyperlipidemia    Hypertension    Hypokalemia 02/05/2013   IBS (irritable bowel syndrome) 02/13/2011   Low back pain 03/03/2013   Muscle  cramp 05/22/2014   Obesity    Obesity, unspecified 05/11/2013   OSA (obstructive sleep apnea)    cpap    Pain in joint, shoulder region 06/27/2015   Perimenopause 10/05/2012   Pre-diabetes    Raynaud disease 06/16/2013   Thyroid disease    hypothyroidism   Vocal fold nodules      Social History   Tobacco Use   Smoking status: Former    Packs/day: 0.75    Years: 42.00    Pack years: 31.50    Types: Cigarettes    Quit date: 04/24/2005    Years since quitting: 15.4   Smokeless tobacco: Never   Tobacco comments:    smoked since age 75  Vaping Use   Vaping Use: Never used  Substance Use Topics   Alcohol use: No   Drug use: Never    [name] reports that she quit smoking about 15 years ago. Her smoking use included cigarettes. She has a 31.50 pack-year smoking history. She has never used smokeless tobacco. She reports that she does not drink alcohol and does not use drugs.  Tobacco Cessation: Former smoker , Quit 2007 with a 42 pack year smoking history. Last year for lung cancer screening 2022.   Past surgical hx, Family hx, Social hx all reviewed.  Current Outpatient Medications on File Prior to Visit  Medication Sig   albuterol (VENTOLIN HFA) 108 (90 Base) MCG/ACT inhaler INHALE 2 PUFFS INTO THE LUNGS EVERY 4 (FOUR) HOURS AS NEEDED FOR WHEEZING OR SHORTNESS OF BREATH.   busPIRone (BUSPAR) 7.5 MG tablet Take 1 tablet (7.5 mg total) by mouth 2 (two) times daily.   Calcium Carbonate 500 MG CHEW Chew 500 mg by mouth daily at 12 noon.   Cod Liver Oil 1000 MG CAPS Take 1,000 mg by mouth daily.   diphenhydrAMINE (BENADRYL) 25 MG tablet Take 25 mg by mouth every 6 (six) hours as needed.   Evolocumab 140 MG/ML SOAJ INJECT 1 DOSE INTO THE SKIN EVERY 14 (FOURTEEN) DAYS. (Patient taking differently: Inject 140 mg into the skin every 14 (fourteen) days.)   furosemide (LASIX) 20 MG tablet Take 1 tablet (20 mg total) by mouth daily as needed.   ipratropium (ATROVENT) 0.06 % nasal spray  Place 2 sprays into the nose daily.   loratadine (CLARITIN) 10 MG tablet Take 20 mg by mouth daily.   metoprolol tartrate (LOPRESSOR) 25  MG tablet Take 1 tablet (25 mg total) by mouth 2 (two) times daily.   Milnacipran (SAVELLA) 50 MG TABS tablet TAKE 1 TABLET (50 MG TOTAL) BY MOUTH 2 (TWO) TIMES DAILY. *START AFTER TITRATION PACK IS COMPLETE*   modafinil (PROVIGIL) 200 MG tablet TAKE 2 TABLETS BY MOUTH DAILY   Multiple Vitamins-Minerals (MULTIVITAMIN WITH MINERALS) tablet Take 1 tablet by mouth daily.   pantoprazole (PROTONIX) 40 MG tablet Take 1 tablet (40 mg total) by mouth daily.   ranolazine (RANEXA) 500 MG 12 hr tablet Take 1 tablet (500 mg total) by mouth 2 (two) times daily.   spironolactone (ALDACTONE) 50 MG tablet Take 1 tablet (50 mg total) by mouth 3 (three) times daily.   thyroid (ARMOUR) 90 MG tablet TAKE 1 TABLET (90 MG TOTAL) BY MOUTH ONCE DAILY   Vitamin D, Ergocalciferol, (DRISDOL) 1.25 MG (50000 UNIT) CAPS capsule TAKE 1 CAPSULE (50,000 UNITS TOTAL) BY MOUTH EVERY 7 (SEVEN) DAYS.   gabapentin (NEURONTIN) 100 MG capsule Take 3 capsules (300 mg total) by mouth at bedtime.   [DISCONTINUED] DULoxetine (CYMBALTA) 30 MG capsule Take 1 capsule (30 mg total) by mouth daily.   No current facility-administered medications on file prior to visit.     Allergies  Allergen Reactions   Crestor [Rosuvastatin] Other (See Comments)    Myalgias and rising CPK   Losartan Cough   Wellbutrin [Bupropion] Other (See Comments)    Dizziness and mental status changes   Atorvastatin     myalgias   Amoxicillin Rash   Aspirin Nausea Only and Other (See Comments)    GI upset REACTION: Upset stomach   Codeine Rash   Erythromycin Rash and Other (See Comments)   Penicillins Rash    Reaction: 15 years    Review Of Systems:  Constitutional:   + intentional   weight loss, No night sweats,  Fevers, chills, fatigue, or  lassitude.  HEENT:   No headaches,  Difficulty swallowing,  Tooth/dental  problems, or  Sore throat,                No sneezing, itching, ear ache, nasal congestion, post nasal drip,   CV:  No chest pain,  Orthopnea, PND, swelling in lower extremities, anasarca, dizziness, palpitations, syncope.   GI  No heartburn, indigestion, abdominal pain, nausea, vomiting, diarrhea, change in bowel habits, loss of appetite, bloody stools.   Resp: No shortness of breath with exertion or at rest.  No excess mucus, no productive cough,  No non-productive cough,  No coughing up of blood.  No change in color of mucus.  No wheezing.  No chest wall deformity  Skin: no rash or lesions.  GU: no dysuria, change in color of urine, no urgency or frequency.  No flank pain, no hematuria   MS:  No joint pain or swelling.  No decreased range of motion.  No back pain.  Psych:  No change in mood or affect. No depression or anxiety.  No memory loss.   Vital Signs BP (!) 122/52 (BP Location: Left Arm, Patient Position: Sitting, Cuff Size: Normal)   Temp 97.6 F (36.4 C) (Oral)   Ht 5' 4.5" (1.638 m)   Wt 210 lb 9.6 oz (95.5 kg)   BMI 35.59 kg/m    Physical Exam:  General- No distress,  A&Ox3, pleasant  ENT: No sinus tenderness, TM clear, pale nasal mucosa, no oral exudate,no post nasal drip, no LAN Cardiac: S1, S2, regular rate and rhythm, no murmur Chest: No  wheeze/ rales/ dullness; no accessory muscle use, no nasal flaring, no sternal retractions Abd.: Soft Non-tender, ND, BS +, Body mass index is 35.59 kg/m. Ext: No clubbing cyanosis, edema Neuro:  normal strength, MAE x 4, A&O x 3 Skin: No rashes, warm and dry Psych: normal mood and behavior   Assessment/Plan OSA on CPAP 100% compliant AHI of 0.3 Plan You are doing great on your CPAP machine. Keep up the great work.  We will order new supplies today. We will place an order for a new Resmed machine as yours is 60 years old, but there will be a wait. Continue on CPAP at bedtime. You appear to be benefiting from the  treatment  Goal is to wear for at least 6 hours each night for maximal clinical benefit. Continue to work on weight loss, as the link between excess weight  and sleep apnea is well established.   Remember to establish a good bedtime routine, and work on sleep hygiene.  Limit daytime naps , avoid stimulants such as caffeine and nicotine close to bedtime, exercise daily to promote sleep quality, avoid heavy , spicy, fried , or rich foods before bed. Ensure adequate exposure to natural light during the day,establish a relaxing bedtime routine with a pleasant sleep environment ( Bedroom between 60 and 67 degrees, turn off bright lights , TV or device screens screens , consider black out curtains or white noise machines) Do not drive if sleepy. Remember to clean mask, tubing, filter, and reservoir once weekly with soapy water.  Follow up with Dr. Elsworth Soho  In 12 months  or before as needed.   Call for Provigil refills as needed.   Former tobacco Abuse Quit 2007 Plan Lung Cancer Screening scan 10/22/2020 as is scheduled.  This will be last scan as she has been smoke free x 15 years.   I spent 30 minutes dedicated to the care of this patient on the date of this encounter to include pre-visit review of records, face-to-face time with the patient discussing conditions above, post visit ordering of testing, clinical documentation with the electronic health record, making appropriate referrals as documented, and communicating necessary information to the patient's healthcare team.    Magdalen Spatz, NP 10/18/2020  11:27 AM

## 2020-10-18 NOTE — Patient Instructions (Signed)
Access Code: [email or web address] URL: [email or web address] Date: 10/18/2020 Prepared by: Jari Favre  Exercises Supine Butterfly Groin Stretch - 1 x daily - 7 x weekly - 1 sets - 3 reps - 30 sec hold Supine Figure 4 Piriformis Stretch - 1 x daily - 7 x weekly - 1 sets - 3 reps - 30 sec hold Supine Hamstring Stretch - 1 x daily - 7 x weekly - 1 sets - 3 reps - 30 sec hold Supine Pelvic Floor Stretch - Hands on Knees - 1 x daily - 7 x weekly - 1 sets - 3 reps - 30 hold

## 2020-10-18 NOTE — Patient Instructions (Addendum)
It is good to see you today.  You are doing great on your CPAP machine. Keep up the great work.  We will order new supplies today. We will place an order for a new Resmed machine as yours is 60 years old, but there will be a wait. Continue on CPAP at bedtime. You appear to be benefiting from the treatment  Goal is to wear for at least 6 hours each night for maximal clinical benefit. Continue to work on weight loss, as the link between excess weight  and sleep apnea is well established.   Remember to establish a good bedtime routine, and work on sleep hygiene.  Limit daytime naps , avoid stimulants such as caffeine and nicotine close to bedtime, exercise daily to promote sleep quality, avoid heavy , spicy, fried , or rich foods before bed. Ensure adequate exposure to natural light during the day,establish a relaxing bedtime routine with a pleasant sleep environment ( Bedroom between 60 and 67 degrees, turn off bright lights , TV or device screens screens , consider black out curtains or white noise machines) Do not drive if sleepy. Remember to clean mask, tubing, filter, and reservoir once weekly with soapy water.  Follow up with Dr. Elsworth Soho  In 12 months  or before as needed.   Call for Provigil refills as needed.   Lung Cancer Screening scan 10/22/2020.

## 2020-10-18 NOTE — Therapy (Signed)
[facility] [address], [place], [place], [address] Phone: [phone]   Fax:  [phone]  Physical Therapy Treatment  Patient Details  Name: [name] MRN: [id number] Date of Birth: June 07, 1960 Referring Provider (PT): Vladimir Crofts, [place]   Encounter Date: 10/18/2020   PT End of Session - 10/18/20 0935     Visit Number 2    Date for PT Re-Evaluation 01/06/21    Authorization Type [employer] employee    PT Start Time 0973    PT Stop Time 1013    PT Time Calculation (min) 41 min    Activity Tolerance Patient tolerated treatment well    Behavior During Therapy [facility] for tasks assessed/performed             Past Medical History:  Diagnosis Date   Acute bronchitis 05/25/2016   Anxiety    Arthritis    Chronic pain syndrome 01/06/2013   Chronic rhinosinusitis    Colon polyps    Cough 01/06/2013   Depression    Diabetes ([place]) 01/06/2013   Diabetes mellitus type 2 in obese [facility]) 01/06/2013   Sees Dr Calvert Cantor for eye exam Does not see podiatry, foot exam today unremarkable except for thick cracking skin on heals  pt denies    Dyspnea    with exertion    Dysuria 05/25/2016   Edema 01/06/2013   Epistaxis 05/25/2016   Fibroid, uterine    Fibromyalgia    GERD (gastroesophageal reflux disease)    Glaucoma 12-13   Hoarseness    Hoarseness of voice 05/11/2013   Hyperglycemia 04/13/2013   Hyperlipemia, mixed 06/06/2007   Qualifier: Diagnosis of  By: Wynona Luna She feels Lipitor caused increased low back pain and weakness     Hyperlipidemia    Hypertension    Hypokalemia 02/05/2013   IBS (irritable bowel syndrome) 02/13/2011   Low back pain 03/03/2013   Muscle cramp 05/22/2014   Obesity    Obesity, unspecified 05/11/2013   OSA (obstructive sleep apnea)    cpap    Pain in joint, shoulder region 06/27/2015   Perimenopause 10/05/2012   Pre-diabetes    Raynaud disease 06/16/2013   Thyroid disease     hypothyroidism   Vocal fold nodules     Past Surgical History:  Procedure Laterality Date   ABDOMINAL HYSTERECTOMY     BREAST EXCISIONAL BIOPSY Right    at age 60   CHOLECYSTECTOMY     colonoscopoy      [place]     x2   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   Procedure: LAPAROSCOPIC ROUX-EN-Y GASTRIC BYPASS WITH UPPER ENDOSCOPY;  Surgeon: [name], MD;  [facility];  Service: General;  Laterality: N/A;   I & D EXTREMITY Left 04/11/2018   Procedure: DEBIDMENT DISTAL INTERPHALANGEAL LEFT MIDDLE;  Surgeon: Daryll Brod, MD;  Location: [facility];  Service: Orthopedics;  Laterality: Left;   INCISIONAL HERNIA REPAIR  08/10/2020   Procedure: LAPAROSCOPIC PRIMARY REPAIR OF INCISIONAL HERNIA;  Surgeon: [name], MD;  Location: [facility];  Service: General;;   MASS EXCISION Left 04/11/2018   Procedure: EXCISION MASS;  Surgeon: Daryll Brod, MD;  Location: [place];  Service: Orthopedics;  Laterality: Left;   OOPHORECTOMY     POLYPECTOMY     ROTATOR CUFF REPAIR     UPPER GI ENDOSCOPY N/A 08/10/2020   Procedure: UPPER GI ENDOSCOPY;  Surgeon: [name], MD;  [facility];  Service: General;  Laterality: N/A;    There were no vitals filed for this visit.   Subjective Assessment - 10/18/20 0934     Subjective Pt had a BM with enema last night.    Patient Stated Goals Be able to have BM    Currently in Pain? No/denies                               [facility] Adult PT Treatment/Exercise - 10/18/20 0001       Neuro Re-ed    Neuro Re-ed Details  breathing using box and diaphragm techniques      Exercises   Exercises Lumbar      Lumbar Exercises: Stretches   Active Hamstring Stretch Right;Left;30 seconds    Double Knee to Chest Stretch 2 reps;30 seconds    Piriformis Stretch 2 reps;20 seconds    Figure 4 Stretch 2 reps;20 seconds      Manual Therapy   Manual Therapy Myofascial release     Myofascial Release abdomen around colon                    PT Education - 10/18/20 1013     Education Details Access Code: [email or web address]    Person(s) Educated Patient    Methods Explanation;Verbal cues;Handout;Tactile cues;Demonstration    Comprehension Verbalized understanding;Returned demonstration              PT Short Term Goals - 10/18/20 0936       PT SHORT TERM GOAL #1   Title able to bulge pelvic floor    Status On-going      PT SHORT TERM GOAL #2   Title pt will report at least 20% less straining    Status On-going               PT Long Term Goals - 10/14/20 1217       PT LONG TERM GOAL #1   Title Pt will report BM at least every 3 days without straining or bearing down    Time 12    Period Weeks    Status New    Target Date 01/06/21      PT LONG TERM GOAL #2   Title Pt will improve lumbar soft tissue mobility for improved low back pain    Time 12    Period Weeks    Status New    Target Date 01/06/21      PT LONG TERM GOAL #3   Title Pt will imporve pelvic floor strength to at least 3/5 and holding for 10 seconds due to improved coordination without breath holding    Time 12    Period Weeks    Status New    Target Date 01/06/21      PT LONG TERM GOAL #4   Title Pt will be ind with HEP to maintain improvements and functional level    Time 12    Period Weeks    Status New    Target Date 01/06/21                   Plan - 10/18/20 1207     Clinical Impression Statement Pt responded well to gentle fascial release with more release palpated across transverse colon, sigmoid and rectal regions.  Pt was educated on breathing technique using diaphragm and box breathing for count of 3s.  Pt was given stretches and did correctly using the breathing to lengthen into the pelvic floor. Continue per plan of care.    PT Treatment/Interventions ADLs/Self Care Home Management;Biofeedback;Cryotherapy;Scientist, water quality;Therapeutic activities;Therapeutic exercise;Neuromuscular re-education;Patient/family education;Manual techniques;Taping;Dry needling;Passive range of motion    PT Next Visit Plan lumbar release and dry needling    PT Home Exercise Plan Access Code: [email or web address]; Access Code: [email or web address]    Consulted and Agree with Plan of Care Patient             Patient will benefit from skilled therapeutic intervention in order to improve the following deficits and impairments:  Increased muscle spasms, Pain, Postural dysfunction, Decreased endurance, Decreased coordination, Decreased strength  Visit Diagnosis: Cramp and spasm  Chronic low back pain, unspecified back pain laterality, unspecified whether sciatica present  Muscle weakness (generalized)  Unspecified lack of coordination     Problem List Patient Active Problem List   Diagnosis Date Noted   Gastric bypass status for obesity 08/11/2020   Chronic idiopathic constipation 07/15/2020   Severe chronic obstructive pulmonary disease ([place]) 05/26/2020   Preoperative cardiovascular examination 05/13/2020   Mild CAD 05/13/2020   Prediabetes 05/13/2020   Depression    Anal pain 04/12/2020   Dysphonia 04/12/2020   Epigastric pain 04/12/2020   Family history of malignant neoplasm of gastrointestinal tract 04/12/2020   Left lower quadrant pain 04/12/2020   Pruritus ani 04/12/2020   Pure hypercholesterolemia 04/12/2020   Thoracic and lumbosacral neuritis 04/12/2020   Vocal fold nodules    Thyroid disease    Hypertension    GERD (gastroesophageal reflux disease)    Fibroid, uterine    Depressive disorder    Colon polyps    Chronic rhinosinusitis    Anxiety    Hemorrhoids 02/22/2020   Statin intolerance 12/28/2019   Elevated CK 11/12/2019   Lymphadenopathy 11/12/2019   Neck pain 11/04/2019   Cervical radiculopathy 05/19/2019   Numbness and tingling 04/17/2019   Tachycardia 38/75/6433   Umbilical hernia 29/51/8841    Sun-damaged skin 01/19/2019   Preventative health care 01/19/2019   Right arm pain 01/16/2019   Numbness 10/11/2018   Carpal tunnel syndrome of right wrist 09/04/2018   Entrapment of right ulnar nerve 09/04/2018   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/26/2018   Bilateral hand pain 01/30/2018   Mucoid cyst, joint 01/30/2018   Nodule of finger of both hands 01/10/2018   Weakness 07/03/2017   Shortness of breath on exertion 07/03/2017   Osteoarthritis of spine with radiculopathy, cervical region 07/24/2016   Dysuria 05/25/2016   Epistaxis 05/25/2016   Hypersomnia with sleep apnea 02/18/2016   Severe obesity (BMI >= 40) ([place]) 02/18/2016   Thyroid activity decreased 07/30/2015   Left shoulder pain 06/27/2015   Pain in joint, shoulder region 06/27/2015   Myalgia 03/27/2015   Muscle cramp 05/22/2014   Mass of soft tissue of neck 02/23/2014   AP (abdominal pain) 02/15/2014   Raynaud disease 06/16/2013   Hoarseness of voice 05/11/2013   Obesity, unspecified 05/11/2013   Hyperglycemia 04/13/2013   Backache 03/03/2013   Low back pain 03/03/2013   Hypokalemia 02/05/2013   Edema 01/06/2013   Diabetes mellitus type 2 in obese ([place]) 01/06/2013   Chronic pain syndrome 01/06/2013   Cough 01/06/2013   Diabetes ([place]) 01/06/2013   Perimenopause 10/05/2012   Glaucoma    SOB (shortness of breath) 09/10/2011   Slow transit constipation 02/13/2011   Vitamin D deficiency 02/13/2011   IBS (irritable bowel syndrome) 02/13/2011  Paraesthesia of skin 08/31/2010   RHINITIS, CHRONIC 01/14/2010   Atypical chest pain 06/16/2008   THYROMEGALY 11/11/2007   Allergic rhinitis 11/11/2007   ECZEMA, HANDS 07/22/2007   Hypothyroidism 06/06/2007   Hyperlipemia, mixed 06/06/2007   ADJ DISORDER WITH MIXED ANXIETY & DEPRESSED MOOD 06/06/2007   OSA (obstructive sleep apnea) 06/06/2007   Benign essential hypertension 06/06/2007   COLONIC POLYPS, HX OF 06/06/2007    Jule Ser, PT 10/18/2020,  12:13 PM  [facility] [address] [place], [place], [address] Phone: [phone]   Fax:  [phone]  Name: ALAINNA STAWICKI MRN: [id number] Date of Birth: [birth year]

## 2020-10-18 NOTE — Progress Notes (Signed)
Reviewed and agree with assessment/plan.   Chesley Mires, MD [facility] Pulmonary/Critical Care 10/18/2020, 3:25 PM Pager:  [phone]

## 2020-10-22 ENCOUNTER — Ambulatory Visit: Payer: 59 | Admitting: Internal Medicine

## 2020-10-22 ENCOUNTER — Ambulatory Visit
Admission: RE | Admit: 2020-10-22 | Discharge: 2020-10-22 | Disposition: A | Discharge status: Home / Self Care | Payer: 59 | Source: Ambulatory Visit | Attending: Acute Care | Admitting: Acute Care

## 2020-10-22 ENCOUNTER — Encounter: Payer: Self-pay | Admitting: Internal Medicine

## 2020-10-22 ENCOUNTER — Other Ambulatory Visit: Payer: Self-pay

## 2020-10-22 VITALS — BP 112/74 | HR 80 | Ht 64.5 in | Wt 210.0 lb

## 2020-10-22 DIAGNOSIS — Z87891 Personal history of nicotine dependence: Secondary | ICD-10-CM

## 2020-10-22 DIAGNOSIS — I2510 Atherosclerotic heart disease of native coronary artery without angina pectoris: Secondary | ICD-10-CM | POA: Diagnosis not present

## 2020-10-22 DIAGNOSIS — G720 Drug-induced myopathy: Secondary | ICD-10-CM | POA: Diagnosis not present

## 2020-10-22 DIAGNOSIS — E782 Mixed hyperlipidemia: Secondary | ICD-10-CM | POA: Diagnosis not present

## 2020-10-22 DIAGNOSIS — T466X5A Adverse effect of antihyperlipidemic and antiarteriosclerotic drugs, initial encounter: Secondary | ICD-10-CM

## 2020-10-22 NOTE — Progress Notes (Signed)
LIPID CLINIC CONSULT NOTE  Chief Complaint:  Follow-up dyslipidemia  Primary Care Physician: Mosie Lukes, MD  Primary Cardiologist:  Berniece Salines, DO  HPI:  [name] is a 60 y.o. female who is being seen today for the evaluation of dyslipidemia at the request of Mosie Lukes, MD.  This is a pleasant 60 year old female here for evaluation management of dyslipidemia.  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  More recently she struggled with weight and is actually being evaluated by Dr. [name] for bariatric surgery.  In addition her cholesterol has been elevated, more recently total cholesterol 214, HDL 44, triglycerides 182 and LDL 138.  She denies any prior history of coronary disease although does have risk factors including hypertension, type 2 diabetes and a family history of heart disease.  She had previously tried a number of statins but is struggled with myalgias and possibly has fibromyalgia.  She had seen a rheumatologist for work-up for elevated CKs.  At one point she had a CK up in the 2400 but in general his CK tends to be around in the mid 200s without any statin therapy.  Currently she is not on any treatments.  06/30/2020  Ms. Nephew seen today for follow-up.  Overall her cholesterol unfortunately has gone up.  She had to stop her statin and her ezetimibe.  The statin she felt was causing worsening skeletal myopathy.  Her CKs have ranged in the 200s.  Recent recheck does show some improvement with the CK now of 186.  LDL cholesterol however has risen from 138 up to 175, with total cholesterol 253, triglycerides 148 and HDL 41.  She understands the importance of lowering her cholesterol further.  Her liver enzymes have been normal.  We discussed today about the possibility of a PCSK9 inhibitor which I think is necessary given her high cholesterol.  I wonder if she could have a familial hyperlipidemia however I suspect primarily that  this is dietary.  She is interested in [place] testing and requested a genetic test today.  She is planning on pursuing weight loss therapy.  She does have known coronary artery disease as previously mentioned, however it was mild with 0 coronary calcium and a small amount of noncalcified plaque in the RCA.  Seen on a coronary CT in January 2022.  She was also describing recurrent chest pain symptoms.  She is on Raynaud's is seen but does not report it helped her symptoms.  She is scheduled to follow-up with Dr. Harriet Masson on March 24.  10/22/2020  Ms. Person returns today for follow-up.  Overall she continues to do well.  Her cholesterols come down significantly.  Total is now 24 with HDL 43, LDL 17 and triglycerides 80.  She is on monotherapy Repatha 140 mg every 2 weeks.  She has lost weight and had previous gastric bypass surgery.  Since May she is now down about 12 pounds.  Will need to follow this closely as her LDL may get into the single digits or perhaps a negative calculated number.  We might consider discontinuing her therapy at that point or perhaps switching her to lower dose Praluent.  PMHx:  Past Medical History:  Diagnosis Date   Acute bronchitis 05/25/2016   Anxiety    Arthritis    Chronic pain syndrome 01/06/2013   Chronic rhinosinusitis    Colon polyps    Cough 01/06/2013   Depression  Diabetes ([place]) 01/06/2013   Diabetes mellitus type 2 in obese [facility]) 01/06/2013   Sees Dr Calvert Cantor for eye exam Does not see podiatry, foot exam today unremarkable except for thick cracking skin on heals  pt denies    Dyspnea    with exertion    Dysuria 05/25/2016   Edema 01/06/2013   Epistaxis 05/25/2016   Fibroid, uterine    Fibromyalgia    GERD (gastroesophageal reflux disease)    Glaucoma 12-13   Hoarseness    Hoarseness of voice 05/11/2013   Hyperglycemia 04/13/2013   Hyperlipemia, mixed 06/06/2007   Qualifier: Diagnosis of  By: Wynona Luna She feels Lipitor caused increased low back pain and  weakness     Hyperlipidemia    Hypertension    Hypokalemia 02/05/2013   IBS (irritable bowel syndrome) 02/13/2011   Low back pain 03/03/2013   Muscle cramp 05/22/2014   Obesity    Obesity, unspecified 05/11/2013   OSA (obstructive sleep apnea)    cpap    Pain in joint, shoulder region 06/27/2015   Perimenopause 10/05/2012   Pre-diabetes    Raynaud disease 06/16/2013   Thyroid disease    hypothyroidism   Vocal fold nodules     Past Surgical History:  Procedure Laterality Date   ABDOMINAL HYSTERECTOMY     BREAST EXCISIONAL BIOPSY Right    at age 23   CHOLECYSTECTOMY     colonoscopoy      [place]     x2   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   Procedure: LAPAROSCOPIC ROUX-EN-Y GASTRIC BYPASS WITH UPPER ENDOSCOPY;  Surgeon: [name], MD;  [facility];  Service: General;  Laterality: N/A;   I & D EXTREMITY Left 04/11/2018   Procedure: DEBIDMENT DISTAL INTERPHALANGEAL LEFT MIDDLE;  Surgeon: Daryll Brod, MD;  Location: [facility];  Service: Orthopedics;  Laterality: Left;   INCISIONAL HERNIA REPAIR  08/10/2020   Procedure: LAPAROSCOPIC PRIMARY REPAIR OF INCISIONAL HERNIA;  Surgeon: [name], MD;  Location: [facility];  Service: General;;   MASS EXCISION Left 04/11/2018   Procedure: EXCISION MASS;  Surgeon: Daryll Brod, MD;  Location: [place] [facility];  Service: Orthopedics;  Laterality: Left;   OOPHORECTOMY     POLYPECTOMY     ROTATOR CUFF REPAIR     UPPER GI ENDOSCOPY N/A 08/10/2020   Procedure: UPPER GI ENDOSCOPY;  Surgeon: [name], MD;  [facility];  Service: General;  Laterality: N/A;    FAMHx:  Family History  Problem Relation Age of Onset   Alcohol abuse Father    Cancer Father        renal and colon   Hyperlipidemia Father    Hypertension Father    Stroke Father    Heart disease Father    Cancer Paternal Grandfather        colon   Diabetes Other    High blood pressure Mother    High  Cholesterol Mother    Kidney disease Mother    Depression Mother    Anxiety disorder Mother    Obesity Mother    Breast cancer Other 79   Diabetes Sister    Diabetes Brother     SOCHx:   reports that she quit smoking about 15 years ago. Her smoking use included cigarettes. She has a 31.50 pack-year smoking history. She has never used smokeless tobacco. She reports that she does not drink alcohol and does not use  drugs.  ALLERGIES:  Allergies  Allergen Reactions   Crestor [Rosuvastatin] Other (See Comments)    Myalgias and rising CPK   Losartan Cough   Wellbutrin [Bupropion] Other (See Comments)    Dizziness and mental status changes   Atorvastatin     myalgias   Amoxicillin Rash   Aspirin Nausea Only and Other (See Comments)    GI upset REACTION: Upset stomach   Codeine Rash   Erythromycin Rash and Other (See Comments)   Penicillins Rash    Reaction: 15 years    ROS: Pertinent items noted in HPI and remainder of comprehensive ROS otherwise negative.  HOME MEDS: Current Outpatient Medications on File Prior to Visit  Medication Sig Dispense Refill   albuterol (VENTOLIN HFA) 108 (90 Base) MCG/ACT inhaler INHALE 2 PUFFS INTO THE LUNGS EVERY 4 (FOUR) HOURS AS NEEDED FOR WHEEZING OR SHORTNESS OF BREATH. 18 g 5   busPIRone (BUSPAR) 7.5 MG tablet Take 1 tablet (7.5 mg total) by mouth 2 (two) times daily. 60 tablet 1   Calcium Carbonate 500 MG CHEW Chew 500 mg by mouth daily at 12 noon.     Cod Liver Oil 1000 MG CAPS Take 1,000 mg by mouth daily.     diphenhydrAMINE (BENADRYL) 25 MG tablet Take 25 mg by mouth every 6 (six) hours as needed.     Evolocumab 140 MG/ML SOAJ INJECT 1 DOSE INTO THE SKIN EVERY 14 (FOURTEEN) DAYS. (Patient taking differently: Inject 140 mg into the skin every 14 (fourteen) days.) 6 mL 3   furosemide (LASIX) 20 MG tablet Take 1 tablet (20 mg total) by mouth daily as needed. 90 tablet 1   ipratropium (ATROVENT) 0.06 % nasal spray Place 2 sprays into the  nose daily.     loratadine (CLARITIN) 10 MG tablet Take 20 mg by mouth daily.     metoprolol tartrate (LOPRESSOR) 25 MG tablet Take 1 tablet (25 mg total) by mouth 2 (two) times daily. 180 tablet 0   Milnacipran (SAVELLA) 50 MG TABS tablet TAKE 1 TABLET (50 MG TOTAL) BY MOUTH 2 (TWO) TIMES DAILY. *START AFTER TITRATION PACK IS COMPLETE* 180 tablet 1   modafinil (PROVIGIL) 200 MG tablet TAKE 2 TABLETS BY MOUTH DAILY 180 tablet 1   Multiple Vitamins-Minerals (MULTIVITAMIN WITH MINERALS) tablet Take 1 tablet by mouth daily.     pantoprazole (PROTONIX) 40 MG tablet Take 1 tablet (40 mg total) by mouth daily. 90 tablet 0   Plecanatide (TRULANCE) 3 MG TABS Take 3 mg by mouth daily.     ranolazine (RANEXA) 500 MG 12 hr tablet Take 1 tablet (500 mg total) by mouth 2 (two) times daily. 180 tablet 2   spironolactone (ALDACTONE) 50 MG tablet Take 1 tablet (50 mg total) by mouth 3 (three) times daily. 270 tablet 3   thyroid (ARMOUR) 90 MG tablet TAKE 1 TABLET (90 MG TOTAL) BY MOUTH ONCE DAILY 90 tablet 3   Vitamin D, Ergocalciferol, (DRISDOL) 1.25 MG (50000 UNIT) CAPS capsule TAKE 1 CAPSULE (50,000 UNITS TOTAL) BY MOUTH EVERY 7 (SEVEN) DAYS. 12 capsule 1   [DISCONTINUED] DULoxetine (CYMBALTA) 30 MG capsule Take 1 capsule (30 mg total) by mouth daily. 15 capsule 0   No current facility-administered medications on file prior to visit.    LABS/IMAGING: No results found for this or any previous visit (from the past 48 hour(s)). No results found.  LIPID PANEL:    Component Value Date/Time   CHOL 77 (L) 10/11/2020 0816   TRIG 80  10/11/2020 0816   HDL 43 10/11/2020 0816   CHOLHDL 1.8 10/11/2020 0816   CHOLHDL 4.9 02/13/2020 0857   VLDL 43.2 (H) 11/11/2019 1125   LDLCALC 17 10/11/2020 0816   LDLCALC 138 (H) 02/13/2020 0857   LDLDIRECT 159.0 11/11/2019 1125    WEIGHTS: Wt Readings from Last 3 Encounters:  10/22/20 210 lb (95.3 kg)  10/18/20 210 lb 9.6 oz (95.5 kg)  10/13/20 212 lb 1.6 oz (96.2 kg)     VITALS: BP 112/74 (BP Location: Left Arm, Patient Position: Sitting, Cuff Size: Normal)   Pulse 80   Ht 5' 4.5" (1.638 m)   Wt 210 lb (95.3 kg)   BMI 35.49 kg/m   EXAM: Deferred  EKG: Deferred  ASSESSMENT: Mixed dyslipidemia Mild noncalcified coronary disease by cardiac CT-04/2020 Statin myalgias,?  Statin myopathy with elevated CKs Family history of coronary disease Type 2 diabetes Hypertension Status post gastric bypass surgery  PLAN: 1.   Ms. Parada has had an excellent response to Repatha with an LDL now 17.  She continues to lose weight after having gastric bypass surgery.  We will need to monitor cholesterol closely as she may end up being overtreated.  Check lipids in about 3 months.  If her numbers are very low or negative we may consider discontinuing the medication to see what her levels are.  Follow-up with me in 6 months or sooner as necessary.  Pixie Casino, MD, [facility] [place] Director of the Advanced Lipid Disorders &  Cardiovascular Risk Reduction Clinic Diplomate of the American Board of Clinical Lipidology Attending Cardiologist  Direct Dial: [phone]  Fax: [phone]  Website:  [email or web address] 10/22/2020, 8:09 AM

## 2020-10-22 NOTE — Patient Instructions (Signed)
Medication Instructions:  Your physician recommends that you continue on your current medications as directed. Please refer to the Current Medication list given to you today.  *If you need a refill on your cardiac medications before your next appointment, please call your pharmacy*   Lab Work: FASTING lab work in about 3 months to check cholesterol   If you have labs (blood work) drawn today and your tests are completely normal, you will receive your results only by: [place] (if you have MyChart) OR A paper copy in the mail If you have any lab test that is abnormal or we need to change your treatment, we will call you to review the results.   Testing/Procedures: NONE   Follow-Up: At [facility], you and your health needs are our priority.  As part of our continuing mission to provide you with exceptional heart care, we have created designated Provider Care Teams.  These Care Teams include your primary Cardiologist (physician) and Advanced Practice Providers (APPs -  Physician Assistants and Nurse Practitioners) who all work together to provide you with the care you need, when you need it.  We recommend signing up for the patient portal called "MyChart".  Sign up information is provided on this After Visit Summary.  MyChart is used to connect with patients for Virtual Visits (Telemedicine).  Patients are able to view lab/test results, encounter notes, upcoming appointments, etc.  Non-urgent messages can be sent to your provider as well.   To learn more about what you can do with MyChart, go to [email or web address].    Your next appointment:   6 month(s) - lipid clinic  The format for your next appointment:   In Person  Provider:   K. Mali Hilty, MD   Other Instructions

## 2020-10-26 ENCOUNTER — Telehealth (INDEPENDENT_AMBULATORY_CARE_PROVIDER_SITE_OTHER): Payer: 59 | Admitting: Psychiatry

## 2020-10-26 ENCOUNTER — Other Ambulatory Visit (HOSPITAL_COMMUNITY): Payer: Self-pay

## 2020-10-26 ENCOUNTER — Ambulatory Visit (HOSPITAL_COMMUNITY): Payer: 59 | Admitting: Psychiatry

## 2020-10-26 ENCOUNTER — Encounter (HOSPITAL_COMMUNITY): Payer: Self-pay | Admitting: Psychiatry

## 2020-10-26 DIAGNOSIS — M797 Fibromyalgia: Secondary | ICD-10-CM | POA: Diagnosis not present

## 2020-10-26 DIAGNOSIS — F411 Generalized anxiety disorder: Secondary | ICD-10-CM | POA: Diagnosis not present

## 2020-10-26 MED ORDER — BUSPIRONE HCL 7.5 MG PO TABS
7.5000 mg | ORAL_TABLET | Freq: Two times a day (BID) | ORAL | 1 refills | Status: DC
  Filled 2020-10-26: qty 60, 30d supply, fill #0

## 2020-10-26 NOTE — Progress Notes (Signed)
[facility] Follow up visit  Patient Identification: [name] MRN:  [id number] Date of Evaluation:  10/26/2020 Referral Source: Primary care Chief Complaint:  follow up anxiety Visit Diagnosis:    ICD-10-CM   1. GAD (generalized anxiety disorder)  F41.1     2. Fibromyalgia  M79.7      Virtual Visit via Video Note  I connected with [name] on 10/26/20 at  8:30 AM EDT by a video enabled telemedicine application and verified that I am speaking with the correct person using two identifiers.  Location: Patient: home Provider: home office   I discussed the limitations of evaluation and management by telemedicine and the availability of in person appointments. The patient expressed understanding and agreed to proceed.    I discussed the assessment and treatment plan with the patient. The patient was provided an opportunity to ask questions and all were answered. The patient agreed with the plan and demonstrated an understanding of the instructions.   The patient was advised to call back or seek an in-person evaluation if the symptoms worsen or if the condition fails to improve as anticipated.  I provided 10  minutes of non-face-to-face time during this encounter.   History of Present Illness: Patient is a 60 years old female lives with her significant other initially referred by primary care physician for establish care for anxiety.  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  Does not have any kids   Has been on Ativan Xanax before, Effexor. She was having job stress related to seeing people dying of COVID that is causing anxiety and BuSpar was started   Doing fair with bid buspar, handling stress   She has gone through gastric bypass surgery and handling it as well  Not on gabapentin  Also on Savella for fibromyalgia denies any significant aches and pains  She follows with Francie Massing for therapy   Her significant other relationship is  going on well.     She uses a CPAP machine at night  Aggravating factors;job stress Modifying factors; significant other Duration since adult life      Past Psychiatric History: anxiety  Previous Psychotropic Medications: Yes  Effexor, xanax  Past Medical History:  Past Medical History:  Diagnosis Date   Acute bronchitis 05/25/2016   Anxiety    Arthritis    Chronic pain syndrome 01/06/2013   Chronic rhinosinusitis    Colon polyps    Cough 01/06/2013   Depression    Diabetes ([place]) 01/06/2013   Diabetes mellitus type 2 in obese ([place]) 01/06/2013   Sees Dr Calvert Cantor for eye exam Does not see podiatry, foot exam today unremarkable except for thick cracking skin on heals  pt denies    Dyspnea    with exertion    Dysuria 05/25/2016   Edema 01/06/2013   Epistaxis 05/25/2016   Fibroid, uterine    Fibromyalgia    GERD (gastroesophageal reflux disease)    Glaucoma 12-13   Hoarseness    Hoarseness of voice 05/11/2013   Hyperglycemia 04/13/2013   Hyperlipemia, mixed 06/06/2007   Qualifier: Diagnosis of  By: Wynona Luna She feels Lipitor caused increased low back pain and weakness     Hyperlipidemia    Hypertension    Hypokalemia 02/05/2013   IBS (irritable bowel syndrome) 02/13/2011   Low back pain 03/03/2013   Muscle cramp 05/22/2014   Obesity    Obesity, unspecified 05/11/2013   OSA (obstructive sleep  apnea)    cpap    Pain in joint, shoulder region 06/27/2015   Perimenopause 10/05/2012   Pre-diabetes    Raynaud disease 06/16/2013   Thyroid disease    hypothyroidism   Vocal fold nodules     Past Surgical History:  Procedure Laterality Date   ABDOMINAL HYSTERECTOMY     BREAST EXCISIONAL BIOPSY Right    at age 43   CHOLECYSTECTOMY     colonoscopoy      [place]     x2   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   Procedure: LAPAROSCOPIC ROUX-EN-Y GASTRIC BYPASS WITH UPPER ENDOSCOPY;  Surgeon: [name], MD;  [facility];   Service: General;  Laterality: N/A;   I & D EXTREMITY Left 04/11/2018   Procedure: DEBIDMENT DISTAL INTERPHALANGEAL LEFT MIDDLE;  Surgeon: Daryll Brod, MD;  Location: [facility];  Service: Orthopedics;  Laterality: Left;   INCISIONAL HERNIA REPAIR  08/10/2020   Procedure: LAPAROSCOPIC PRIMARY REPAIR OF INCISIONAL HERNIA;  Surgeon: [name], MD;  [facility];  Service: General;;   MASS EXCISION Left 04/11/2018   Procedure: EXCISION MASS;  Surgeon: Daryll Brod, MD;  Location: [facility];  Service: Orthopedics;  Laterality: Left;   OOPHORECTOMY     POLYPECTOMY     ROTATOR CUFF REPAIR     UPPER GI ENDOSCOPY N/A 08/10/2020   Procedure: UPPER GI ENDOSCOPY;  Surgeon: [name], MD;  [facility];  Service: General;  Laterality: N/A;    Family Psychiatric History: dad : alcohol use  Family History:  Family History  Problem Relation Age of Onset   Alcohol abuse Father    Cancer Father        renal and colon   Hyperlipidemia Father    Hypertension Father    Stroke Father    Heart disease Father    Cancer Paternal Grandfather        colon   Diabetes Other    High blood pressure Mother    High Cholesterol Mother    Kidney disease Mother    Depression Mother    Anxiety disorder Mother    Obesity Mother    Breast cancer Other 51   Diabetes Sister    Diabetes Brother     Social History:   Social History   Socioeconomic History   Marital status: Significant Other    Spouse name: Not on file   Number of children: 0   Years of education: Not on file   Highest education level: Not on file  Occupational History   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  Tobacco Use   Smoking status: Former    Packs/day: 0.75    Years: 42.00    Pack years: 31.50    Types: Cigarettes    Quit date: 04/24/2005    Years since quitting: 15.5   Smokeless tobacco: Never   Tobacco comments:    smoked since age 9  Vaping Use   Vaping Use: Never used  Substance  and Sexual Activity   Alcohol use: No   Drug use: Never   Sexual activity: Yes    Partners: Male  Other Topics Concern   Not on file  Social History Narrative   Endo-- Dr Dwyane Dee   ENT--Dr shoemaker   GI--Dr Buccini   Pulm--Dr Clance   Rheum--Dr Charlestine Night         Social Determinants of Health   Financial Resource Strain: Not on file  Food Insecurity: Not on file  Transportation Needs: Not on file  Physical Activity: Not on file  Stress: Not on file  Social Connections: Not on file      Allergies:   Allergies  Allergen Reactions   Crestor [Rosuvastatin] Other (See Comments)    Myalgias and rising CPK   Losartan Cough   Wellbutrin [Bupropion] Other (See Comments)    Dizziness and mental status changes   Atorvastatin     myalgias   Amoxicillin Rash   Aspirin Nausea Only and Other (See Comments)    GI upset REACTION: Upset stomach   Codeine Rash   Erythromycin Rash and Other (See Comments)   Penicillins Rash    Reaction: 15 years    Metabolic Disorder Labs: Lab Results  Component Value Date   HGBA1C 5.9 (H) 08/03/2020   MPG 122.63 08/03/2020   MPG 137 02/13/2020   No results found for: PROLACTIN Lab Results  Component Value Date   CHOL 77 (L) 10/11/2020   TRIG 80 10/11/2020   HDL 43 10/11/2020   CHOLHDL 1.8 10/11/2020   VLDL 43.2 (H) 11/11/2019   LDLCALC 17 10/11/2020   LDLCALC 175 (H) 06/22/2020   LDLCALC 162 (H) 06/22/2020   Lab Results  Component Value Date   TSH 1.02 02/13/2020    Therapeutic Level Labs: No results found for: LITHIUM No results found for: CBMZ No results found for: VALPROATE  Current Medications: Current Outpatient Medications  Medication Sig Dispense Refill   albuterol (VENTOLIN HFA) 108 (90 Base) MCG/ACT inhaler INHALE 2 PUFFS INTO THE LUNGS EVERY 4 (FOUR) HOURS AS NEEDED FOR WHEEZING OR SHORTNESS OF BREATH. 18 g 5   busPIRone (BUSPAR) 7.5 MG tablet Take 1 tablet (7.5 mg total) by mouth 2 (two) times daily. 60 tablet 1    Calcium Carbonate 500 MG CHEW Chew 500 mg by mouth daily at 12 noon.     Cod Liver Oil 1000 MG CAPS Take 1,000 mg by mouth daily.     diphenhydrAMINE (BENADRYL) 25 MG tablet Take 25 mg by mouth every 6 (six) hours as needed.     Evolocumab 140 MG/ML SOAJ INJECT 1 DOSE INTO THE SKIN EVERY 14 (FOURTEEN) DAYS. (Patient taking differently: Inject 140 mg into the skin every 14 (fourteen) days.) 6 mL 3   furosemide (LASIX) 20 MG tablet Take 1 tablet (20 mg total) by mouth daily as needed. 90 tablet 1   ipratropium (ATROVENT) 0.06 % nasal spray Place 2 sprays into the nose daily.     loratadine (CLARITIN) 10 MG tablet Take 20 mg by mouth daily.     metoprolol tartrate (LOPRESSOR) 25 MG tablet Take 1 tablet (25 mg total) by mouth 2 (two) times daily. 180 tablet 0   Milnacipran (SAVELLA) 50 MG TABS tablet TAKE 1 TABLET (50 MG TOTAL) BY MOUTH 2 (TWO) TIMES DAILY. *START AFTER TITRATION PACK IS COMPLETE* 180 tablet 1   modafinil (PROVIGIL) 200 MG tablet TAKE 2 TABLETS BY MOUTH DAILY 180 tablet 1   Multiple Vitamins-Minerals (MULTIVITAMIN WITH MINERALS) tablet Take 1 tablet by mouth daily.     pantoprazole (PROTONIX) 40 MG tablet Take 1 tablet (40 mg total) by mouth daily. 90 tablet 0   Plecanatide (TRULANCE) 3 MG TABS Take 3 mg by mouth daily.     ranolazine (RANEXA) 500 MG 12 hr tablet Take 1 tablet (500 mg total) by mouth 2 (two) times daily. 180 tablet 2   spironolactone (ALDACTONE) 50 MG tablet Take 1 tablet (50  mg total) by mouth 3 (three) times daily. 270 tablet 3   thyroid (ARMOUR) 90 MG tablet TAKE 1 TABLET (90 MG TOTAL) BY MOUTH ONCE DAILY 90 tablet 3   Vitamin D, Ergocalciferol, (DRISDOL) 1.25 MG (50000 UNIT) CAPS capsule TAKE 1 CAPSULE (50,000 UNITS TOTAL) BY MOUTH EVERY 7 (SEVEN) DAYS. 12 capsule 1   No current facility-administered medications for this visit.     Psychiatric Specialty Exam: Review of Systems  Psychiatric/Behavioral:  Negative for agitation and self-injury.    There were no  vitals taken for this visit.There is no height or weight on file to calculate BMI.  General Appearance: Casual  Eye Contact:  Fair  Speech:  Slow  Volume:  Decreased  Mood:  Euthymic  Affect:  Constricted  Thought Process:  Goal Directed  Orientation:  Full (Time, Place, and Person)  Thought Content:  Rumination  Suicidal Thoughts:  No  Homicidal Thoughts:  No  Memory:  Immediate;   Fair Recent;   Fair  Judgement:  Fair  Insight:  Fair  Psychomotor Activity:  Decreased  Concentration:  Concentration: Fair and Attention Span: Fair  Recall:  [employer] of Knowledge:Fair  Language: Fair  Akathisia:  No  Handed:    AIMS (if indicated):  not done  Assets:  Desire for Improvement Financial Resources/Insurance  ADL's:  Intact  Cognition: WNL  Sleep:  Fair   Screenings: GAD-7    Flowsheet Row Office Visit from 03/22/2015 in [facility] at [employer]  Total GAD-7 Score 0      PHQ2-9    [place] Visit from 09/09/2020 in [facility] from [facility]24/2020 [facility] Video Visit from 06/29/2020 in [facility] from 03/02/2020 in [facility] Office Visit from 07/03/2017 in [facility]  PHQ-2 Total Score 0 0 1 0 0  PHQ-9 Total Score 0 -- -- -- 4      Flowsheet Row Video Visit from 10/26/2020 in [facility] Office Visit from 09/14/2020 in [facility] Admission (Discharged) from 08/10/2020 in [facility]  C-SSRS RISK CATEGORY No Risk No Risk No Risk       Assessment and Plan:  As follows Prior documentation reviewed  GAD: Managing with therapy and BuSpar 2 times a day   Denies signficant depression or suicidal thoughts  Discussed ME time and distractions  Fibromyalgia.   Manageable Doing fair continue current medication of BuSpar refill sent follow-up in 4 months or earlier if needed    Merian Capron, MD 7/5/20228:40 AM

## 2020-10-27 ENCOUNTER — Ambulatory Visit (INDEPENDENT_AMBULATORY_CARE_PROVIDER_SITE_OTHER): Payer: 59 | Admitting: Professional

## 2020-10-27 DIAGNOSIS — F411 Generalized anxiety disorder: Secondary | ICD-10-CM

## 2020-10-28 ENCOUNTER — Encounter: Payer: Self-pay | Admitting: *Deleted

## 2020-10-28 NOTE — Progress Notes (Signed)
Please call patient and let them  know their  low dose Ct was read as a Lung RADS 2: nodules that are benign in appearance and behavior with a very low likelihood of becoming a clinically active cancer due to size or lack of growth.  .Pt. no longer qualifies for lung cancer screening , as next year her quit date will be 16 years. .Pt.  is not  currently on statin therapy. Please fax results to PCP, and let patient and PCP know she no longer meets criteria for screening as it will be > than 15 years since she quit. Please thank her for participating in the screening program. . Thanks so much.

## 2020-10-29 ENCOUNTER — Ambulatory Visit: Payer: 59 | Attending: Physician Assistant | Admitting: Physical Therapy

## 2020-10-29 ENCOUNTER — Other Ambulatory Visit: Payer: Self-pay | Admitting: Family Medicine

## 2020-10-29 ENCOUNTER — Other Ambulatory Visit: Payer: Self-pay

## 2020-10-29 ENCOUNTER — Other Ambulatory Visit (HOSPITAL_COMMUNITY): Payer: Self-pay

## 2020-10-29 ENCOUNTER — Encounter: Payer: Self-pay | Admitting: Physical Therapy

## 2020-10-29 DIAGNOSIS — M6281 Muscle weakness (generalized): Secondary | ICD-10-CM | POA: Diagnosis not present

## 2020-10-29 DIAGNOSIS — M545 Low back pain, unspecified: Secondary | ICD-10-CM | POA: Diagnosis not present

## 2020-10-29 DIAGNOSIS — G8929 Other chronic pain: Secondary | ICD-10-CM | POA: Diagnosis not present

## 2020-10-29 DIAGNOSIS — R279 Unspecified lack of coordination: Secondary | ICD-10-CM | POA: Insufficient documentation

## 2020-10-29 DIAGNOSIS — R252 Cramp and spasm: Secondary | ICD-10-CM | POA: Insufficient documentation

## 2020-10-29 NOTE — Telephone Encounter (Signed)
Would you like Pt to continue Vit D?

## 2020-10-29 NOTE — Therapy (Signed)
[facility] [address], [place], [place], [address] Phone: [phone]   Fax:  [phone]  Physical Therapy Treatment  Patient Details  Name: [name] MRN: [id number] Date of Birth: Oct 21, 1960 Referring Provider (PT): Vladimir Crofts, [place]   Encounter Date: 10/29/2020   PT End of Session - 10/29/20 0800     Visit Number 3    Date for PT Re-Evaluation 01/06/21    Authorization Type [employer] employee    PT Start Time 0801    PT Stop Time 0841    PT Time Calculation (min) 40 min    Activity Tolerance Patient tolerated treatment well    Behavior During Therapy [facility] for tasks assessed/performed             Past Medical History:  Diagnosis Date   Acute bronchitis 05/25/2016   Anxiety    Arthritis    Chronic pain syndrome 01/06/2013   Chronic rhinosinusitis    Colon polyps    Cough 01/06/2013   Depression    Diabetes ([place]) 01/06/2013   Diabetes mellitus type 2 in obese [facility]) 01/06/2013   Sees Dr Calvert Cantor for eye exam Does not see podiatry, foot exam today unremarkable except for thick cracking skin on heals  pt denies    Dyspnea    with exertion    Dysuria 05/25/2016   Edema 01/06/2013   Epistaxis 05/25/2016   Fibroid, uterine    Fibromyalgia    GERD (gastroesophageal reflux disease)    Glaucoma 12-13   Hoarseness    Hoarseness of voice 05/11/2013   Hyperglycemia 04/13/2013   Hyperlipemia, mixed 06/06/2007   Qualifier: Diagnosis of  By: Wynona Luna She feels Lipitor caused increased low back pain and weakness     Hyperlipidemia    Hypertension    Hypokalemia 02/05/2013   IBS (irritable bowel syndrome) 02/13/2011   Low back pain 03/03/2013   Muscle cramp 05/22/2014   Obesity    Obesity, unspecified 05/11/2013   OSA (obstructive sleep apnea)    cpap    Pain in joint, shoulder region 06/27/2015   Perimenopause 10/05/2012   Pre-diabetes    Raynaud disease 06/16/2013   Thyroid disease     hypothyroidism   Vocal fold nodules     Past Surgical History:  Procedure Laterality Date   ABDOMINAL HYSTERECTOMY     BREAST EXCISIONAL BIOPSY Right    at age 60   CHOLECYSTECTOMY     colonoscopoy      [place]     x2   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   Procedure: LAPAROSCOPIC ROUX-EN-Y GASTRIC BYPASS WITH UPPER ENDOSCOPY;  Surgeon: [name], MD;  [facility];  Service: General;  Laterality: N/A;   I & D EXTREMITY Left 04/11/2018   Procedure: DEBIDMENT DISTAL INTERPHALANGEAL LEFT MIDDLE;  Surgeon: Daryll Brod, MD;  Location: [facility];  Service: Orthopedics;  Laterality: Left;   INCISIONAL HERNIA REPAIR  08/10/2020   Procedure: LAPAROSCOPIC PRIMARY REPAIR OF INCISIONAL HERNIA;  Surgeon: [name], MD;  Location[facility];  Service: General;;   MASS EXCISION Left 04/11/2018   Procedure: EXCISION MASS;  Surgeon: Daryll Brod, MD;  Location: [facility];  Service: Orthopedics;  Laterality: Left;   OOPHORECTOMY     POLYPECTOMY     ROTATOR CUFF REPAIR     UPPER GI ENDOSCOPY N/A 08/10/2020   Procedure: UPPER GI ENDOSCOPY;  Surgeon: [name], MD;  [facility];  Service: General;  Laterality: N/A;    There were no vitals filed for this visit.   Subjective Assessment - 10/29/20 0803     Subjective Feels about the same with BMs    Currently in Pain? No/denies                               [facility] Adult PT Treatment/Exercise - 10/29/20 0001       Self-Care   Other Self-Care Comments  reviewed sitting posture      Lumbar Exercises: Stretches   Hip Flexor Stretch Right;Left;30 seconds      Lumbar Exercises: Standing   Other Standing Lumbar Exercises wall push ups      Lumbar Exercises: Supine   Dead Bug 20 reps    Dead Bug Limitations arm, then added legs      Manual Therapy   Myofascial Release lumbar and thoracic              Trigger Point Dry Needling - 10/29/20  0001     Consent Given? Yes    Education Handout Provided Previously provided    Muscles Treated Back/Hip Thoracic multifidi;Lumbar multifidi    Lumbar multifidi Response Twitch response elicited;Palpable increased muscle length    Thoracic multifidi response Twitch response elicited;Palpable increased muscle length                    PT Short Term Goals - 10/18/20 0936       PT SHORT TERM GOAL #1   Title able to bulge pelvic floor    Status On-going      PT SHORT TERM GOAL #2   Title pt will report at least 20% less straining    Status On-going               PT Long Term Goals - 10/14/20 1217       PT LONG TERM GOAL #1   Title Pt will report BM at least every 3 days without straining or bearing down    Time 12    Period Weeks    Status New    Target Date 01/06/21      PT LONG TERM GOAL #2   Title Pt will improve lumbar soft tissue mobility for improved low back pain    Time 12    Period Weeks    Status New    Target Date 01/06/21      PT LONG TERM GOAL #3   Title Pt will imporve pelvic floor strength to at least 3/5 and holding for 10 seconds due to improved coordination without breath holding    Time 12    Period Weeks    Status New    Target Date 01/06/21      PT LONG TERM GOAL #4   Title Pt will be ind with HEP to maintain improvements and functional level    Time 12    Period Weeks    Status New    Target Date 01/06/21                   Plan - 10/29/20 0844     Clinical Impression Statement Pt was able to tolerate dry needling.  She had muscle tension that release with needling and manual techniques . Pt was given initial core strengthening exercises today. Needs cues for posture. Pt will benefit  from skilled PT to continue with POC             Patient will benefit from skilled therapeutic intervention in order to improve the following deficits and impairments:     Visit Diagnosis: Cramp and spasm  Chronic low back  pain, unspecified back pain laterality, unspecified whether sciatica present  Muscle weakness (generalized)  Unspecified lack of coordination     Problem List Patient Active Problem List   Diagnosis Date Noted   Gastric bypass status for obesity 08/11/2020   Chronic idiopathic constipation 07/15/2020   Severe chronic obstructive pulmonary disease ([place]) 05/26/2020   Preoperative cardiovascular examination 05/13/2020   Mild CAD 05/13/2020   Prediabetes 05/13/2020   Depression    Anal pain 04/12/2020   Dysphonia 04/12/2020   Epigastric pain 04/12/2020   Family history of malignant neoplasm of gastrointestinal tract 04/12/2020   Left lower quadrant pain 04/12/2020   Pruritus ani 04/12/2020   Pure hypercholesterolemia 04/12/2020   Thoracic and lumbosacral neuritis 04/12/2020   Vocal fold nodules    Thyroid disease    Hypertension    GERD (gastroesophageal reflux disease)    Fibroid, uterine    Depressive disorder    Colon polyps    Chronic rhinosinusitis    Anxiety    Hemorrhoids 02/22/2020   Statin intolerance 12/28/2019   Elevated CK 11/12/2019   Lymphadenopathy 11/12/2019   Neck pain 11/04/2019   Cervical radiculopathy 05/19/2019   Numbness and tingling 04/17/2019   Tachycardia 50/53/9767   Umbilical hernia 34/19/3790   Sun-damaged skin 01/19/2019   Preventative health care 01/19/2019   Right arm pain 01/16/2019   Numbness 10/11/2018   Carpal tunnel syndrome of right wrist 09/04/2018   Entrapment of right ulnar nerve 09/04/2018   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/26/2018   Bilateral hand pain 01/30/2018   Mucoid cyst, joint 01/30/2018   Nodule of finger of both hands 01/10/2018   Weakness 07/03/2017   Shortness of breath on exertion 07/03/2017   Osteoarthritis of spine with radiculopathy, cervical region 07/24/2016   Dysuria 05/25/2016   Epistaxis 05/25/2016   Hypersomnia with sleep apnea 02/18/2016   Severe obesity (BMI >= 40) ([place]) 02/18/2016    Thyroid activity decreased 07/30/2015   Left shoulder pain 06/27/2015   Pain in joint, shoulder region 06/27/2015   Myalgia 03/27/2015   Muscle cramp 05/22/2014   Mass of soft tissue of neck 02/23/2014   AP (abdominal pain) 02/15/2014   Raynaud disease 06/16/2013   Hoarseness of voice 05/11/2013   Obesity, unspecified 05/11/2013   Hyperglycemia 04/13/2013   Backache 03/03/2013   Low back pain 03/03/2013   Hypokalemia 02/05/2013   Edema 01/06/2013   Diabetes mellitus type 2 in obese ([place]) 01/06/2013   Chronic pain syndrome 01/06/2013   Cough 01/06/2013   Diabetes ([place]) 01/06/2013   Perimenopause 10/05/2012   Glaucoma    SOB (shortness of breath) 09/10/2011   Slow transit constipation 02/13/2011   Vitamin D deficiency 02/13/2011   IBS (irritable bowel syndrome) 02/13/2011   Paraesthesia of skin 08/31/2010   RHINITIS, CHRONIC 01/14/2010   Atypical chest pain 06/16/2008   THYROMEGALY 11/11/2007   Allergic rhinitis 11/11/2007   ECZEMA, HANDS 07/22/2007   Hypothyroidism 06/06/2007   Hyperlipemia, mixed 06/06/2007   ADJ DISORDER WITH MIXED ANXIETY & DEPRESSED MOOD 06/06/2007   OSA (obstructive sleep apnea) 06/06/2007   Benign essential hypertension 06/06/2007   COLONIC POLYPS, HX OF 06/06/2007    Camillo Flaming Suttyn Cryder, PT 10/29/2020, 8:45 AM  [facility] [address] [place], [place], [address] Phone: [phone]   Fax:  [phone]  Name: LAQUANDA BICK MRN: [id number] Date of Birth: 06-29-1960

## 2020-10-31 MED ORDER — VITAMIN D (ERGOCALCIFEROL) 1.25 MG (50000 UNIT) PO CAPS
50000.0000 [IU] | ORAL_CAPSULE | ORAL | 1 refills | Status: DC
  Filled 2020-10-31: qty 12, 84d supply, fill #0
  Filled 2021-01-31: qty 12, 84d supply, fill #1

## 2020-11-01 ENCOUNTER — Encounter: Payer: Self-pay | Admitting: Family Medicine

## 2020-11-01 ENCOUNTER — Other Ambulatory Visit (HOSPITAL_COMMUNITY): Payer: Self-pay

## 2020-11-01 ENCOUNTER — Ambulatory Visit: Payer: 59 | Admitting: Physical Therapy

## 2020-11-01 DIAGNOSIS — G4733 Obstructive sleep apnea (adult) (pediatric): Secondary | ICD-10-CM | POA: Diagnosis not present

## 2020-11-01 DIAGNOSIS — I10 Essential (primary) hypertension: Secondary | ICD-10-CM | POA: Diagnosis not present

## 2020-11-02 ENCOUNTER — Encounter: Payer: Self-pay | Admitting: Family Medicine

## 2020-11-02 ENCOUNTER — Other Ambulatory Visit: Payer: Self-pay

## 2020-11-02 ENCOUNTER — Telehealth (INDEPENDENT_AMBULATORY_CARE_PROVIDER_SITE_OTHER): Payer: 59 | Admitting: Family Medicine

## 2020-11-02 ENCOUNTER — Other Ambulatory Visit (HOSPITAL_COMMUNITY): Payer: Self-pay

## 2020-11-02 DIAGNOSIS — Z20822 Contact with and (suspected) exposure to covid-19: Secondary | ICD-10-CM | POA: Diagnosis not present

## 2020-11-02 MED ORDER — NIRMATRELVIR/RITONAVIR (PAXLOVID)TABLET
3.0000 | ORAL_TABLET | Freq: Two times a day (BID) | ORAL | 0 refills | Status: AC
Start: 2020-11-02 — End: 2020-11-07
  Filled 2020-11-02: qty 30, 5d supply, fill #0

## 2020-11-02 MED ORDER — PROMETHAZINE-DM 6.25-15 MG/5ML PO SYRP
5.0000 mL | ORAL_SOLUTION | Freq: Four times a day (QID) | ORAL | 0 refills | Status: DC | PRN
  Filled 2020-11-02: qty 118, 6d supply, fill #0

## 2020-11-02 NOTE — Progress Notes (Signed)
Chief Complaint  Patient presents with   Generalized Body Aches    Exposed to [place] here for URI complaints. Due to COVID-19 pandemic, we are interacting via web portal for an electronic face-to-face visit. I verified patient's ID using 2 identifiers. Patient agreed to proceed with visit via this method. Patient is at home, I am at office. Patient and I are present for visit.   Duration: 2 days  Associated symptoms: sinus congestion, myalgia, and cough, fatigue Denies: sinus pain, itchy watery eyes, ear pain, ear drainage, sore throat, wheezing, shortness of breath, N/V/D, loss of taste/smell and fevers Treatment to date: Mucinex, Tylenol Sick contacts: Yes- husband tested positive on 7/9.  Tested neg on 7/11 and 7/9.   Past Medical History:  Diagnosis Date   Acute bronchitis 05/25/2016   Anxiety    Arthritis    Chronic pain syndrome 01/06/2013   Chronic rhinosinusitis    Colon polyps    Cough 01/06/2013   Depression    Diabetes ([place]) 01/06/2013   Diabetes mellitus type 2 in obese ([place]) 01/06/2013   Sees Dr Calvert Cantor for eye exam Does not see podiatry, foot exam today unremarkable except for thick cracking skin on heals  pt denies    Dyspnea    with exertion    Dysuria 05/25/2016   Edema 01/06/2013   Epistaxis 05/25/2016   Fibroid, uterine    Fibromyalgia    GERD (gastroesophageal reflux disease)    Glaucoma 12-13   Hoarseness    Hoarseness of voice 05/11/2013   Hyperglycemia 04/13/2013   Hyperlipemia, mixed 06/06/2007   Qualifier: Diagnosis of  By: Wynona Luna She feels Lipitor caused increased low back pain and weakness     Hyperlipidemia    Hypertension    Hypokalemia 02/05/2013   IBS (irritable bowel syndrome) 02/13/2011   Low back pain 03/03/2013   Muscle cramp 05/22/2014   Obesity    Obesity, unspecified 05/11/2013   OSA (obstructive sleep apnea)    cpap    Pain in joint, shoulder region 06/27/2015   Perimenopause 10/05/2012   Pre-diabetes     Raynaud disease 06/16/2013   Thyroid disease    hypothyroidism   Vocal fold nodules    Objective No conversational dyspnea Age appropriate judgment and insight Nml affect and mood  Exposure to COVID-19 virus - Plan: promethazine-dextromethorphan (PROMETHAZINE-DM) 6.25-15 MG/5ML syrup, nirmatrelvir/ritonavir EUA (PAXLOVID) TABS  Likely has virus and tested falsely neg. Will send in syrup prn and Paxlovid for 5 d. Stop Ranexa and BuSpar while on it.  Continue to push fluids, practice good hand hygiene, cover mouth when coughing. F/u prn. If starting to experience irreplaceable fluid loss, shaking, or shortness of breath, seek immediate care. Pt voiced understanding and agreement to the plan.  [place], DO 11/02/20 3:32 PM

## 2020-11-03 ENCOUNTER — Other Ambulatory Visit: Payer: Self-pay

## 2020-11-03 ENCOUNTER — Other Ambulatory Visit (HOSPITAL_COMMUNITY): Payer: Self-pay

## 2020-11-04 ENCOUNTER — Other Ambulatory Visit (HOSPITAL_COMMUNITY): Payer: Self-pay

## 2020-11-04 ENCOUNTER — Encounter: Payer: Self-pay | Admitting: *Deleted

## 2020-11-05 ENCOUNTER — Other Ambulatory Visit: Payer: Self-pay | Admitting: Family Medicine

## 2020-11-05 ENCOUNTER — Encounter: Payer: Self-pay | Admitting: Family Medicine

## 2020-11-05 ENCOUNTER — Other Ambulatory Visit (HOSPITAL_COMMUNITY): Payer: Self-pay

## 2020-11-05 MED ORDER — PANTOPRAZOLE SODIUM 40 MG PO TBEC
40.0000 mg | DELAYED_RELEASE_TABLET | Freq: Every day | ORAL | 0 refills | Status: DC
  Filled 2020-11-05 – 2020-11-12 (×2): qty 90, 90d supply, fill #0

## 2020-11-08 ENCOUNTER — Ambulatory Visit (INDEPENDENT_AMBULATORY_CARE_PROVIDER_SITE_OTHER): Payer: 59 | Admitting: Professional

## 2020-11-08 DIAGNOSIS — F411 Generalized anxiety disorder: Secondary | ICD-10-CM | POA: Diagnosis not present

## 2020-11-10 ENCOUNTER — Ambulatory Visit: Payer: 59

## 2020-11-10 DIAGNOSIS — E039 Hypothyroidism, unspecified: Secondary | ICD-10-CM | POA: Diagnosis not present

## 2020-11-12 ENCOUNTER — Other Ambulatory Visit (HOSPITAL_COMMUNITY): Payer: Self-pay

## 2020-11-12 ENCOUNTER — Other Ambulatory Visit: Payer: Self-pay | Admitting: Family Medicine

## 2020-11-12 ENCOUNTER — Ambulatory Visit: Payer: 59 | Admitting: Physical Therapy

## 2020-11-12 MED ORDER — SAVELLA 50 MG PO TABS
50.0000 mg | ORAL_TABLET | Freq: Two times a day (BID) | ORAL | 1 refills | Status: DC
  Filled 2020-11-12: qty 180, 90d supply, fill #0
  Filled 2021-01-18 – 2021-02-14 (×2): qty 180, 90d supply, fill #1

## 2020-11-12 MED FILL — Thyroid Tab 90 MG (1 1/2 Grain): ORAL | 90 days supply | Qty: 90 | Fill #1 | Status: CN

## 2020-11-14 MED FILL — Thyroid Tab 90 MG (1 1/2 Grain): ORAL | 90 days supply | Qty: 90 | Fill #1 | Status: CN

## 2020-11-15 ENCOUNTER — Other Ambulatory Visit (HOSPITAL_COMMUNITY): Payer: Self-pay

## 2020-11-16 ENCOUNTER — Telehealth (HOSPITAL_COMMUNITY): Payer: Self-pay | Admitting: *Deleted

## 2020-11-16 NOTE — Telephone Encounter (Signed)
Patient called and stated that Dr. De Nurse decreased her Buspar to 7.5 mg BID. Per pt she had to come off of it due to her Covid meds. Per pt on her voicemail, she feels a bit off and would like to know if provider could increase her medication to 3 times a day instead. Patient stated she would like to speak with provider to go over further.

## 2020-11-16 NOTE — Telephone Encounter (Signed)
She can try increasing it to 7.5 mg three times a day. Advise her to have sooner follow up with Dr. Olena Heckle given this medication change. Let me know if she needs refill until that appointment.  Please also ask her what other concerns she may have while Dr. Olena Heckle is on leave this week.

## 2020-11-17 DIAGNOSIS — E042 Nontoxic multinodular goiter: Secondary | ICD-10-CM | POA: Diagnosis not present

## 2020-11-17 DIAGNOSIS — E039 Hypothyroidism, unspecified: Secondary | ICD-10-CM | POA: Diagnosis not present

## 2020-11-17 DIAGNOSIS — E049 Nontoxic goiter, unspecified: Secondary | ICD-10-CM | POA: Diagnosis not present

## 2020-11-17 MED FILL — Thyroid Tab 90 MG (1 1/2 Grain): ORAL | 30 days supply | Qty: 30 | Fill #1 | Status: CN

## 2020-11-17 NOTE — Telephone Encounter (Signed)
Per pt she don't have any other concerns at this time

## 2020-11-17 NOTE — Telephone Encounter (Signed)
Spoke with patient and informed her with what provider stated and she verbalized understanding and agreed with advise. Per pt when she runs out of her current script due to the increase, she will call office to send in new script reflecting the new direction to her pharmacy.

## 2020-11-18 ENCOUNTER — Other Ambulatory Visit (HOSPITAL_COMMUNITY): Payer: Self-pay

## 2020-11-18 MED ORDER — THYROID 90 MG PO TABS
90.0000 mg | ORAL_TABLET | Freq: Every day | ORAL | 4 refills | Status: DC
  Filled 2020-11-18 (×2): qty 90, 90d supply, fill #0

## 2020-11-19 ENCOUNTER — Ambulatory Visit: Payer: 59 | Admitting: Physical Therapy

## 2020-11-19 ENCOUNTER — Other Ambulatory Visit: Payer: Self-pay

## 2020-11-19 ENCOUNTER — Encounter: Payer: Self-pay | Admitting: Physical Therapy

## 2020-11-19 DIAGNOSIS — M6281 Muscle weakness (generalized): Secondary | ICD-10-CM | POA: Diagnosis not present

## 2020-11-19 DIAGNOSIS — R279 Unspecified lack of coordination: Secondary | ICD-10-CM | POA: Diagnosis not present

## 2020-11-19 DIAGNOSIS — G8929 Other chronic pain: Secondary | ICD-10-CM

## 2020-11-19 DIAGNOSIS — R252 Cramp and spasm: Secondary | ICD-10-CM | POA: Diagnosis not present

## 2020-11-19 DIAGNOSIS — M545 Low back pain, unspecified: Secondary | ICD-10-CM | POA: Diagnosis not present

## 2020-11-19 NOTE — Therapy (Signed)
[facility] [address] [place], [place], [address] Phone: [phone]   Fax:  [phone]  Physical Therapy Treatment  Patient Details  Name: [name] MRN: [id number] Date of Birth: [birth year] Referring Provider (PT): Vladimir Crofts, [place]   Encounter Date: 11/19/2020   PT End of Session - 11/19/20 1017     Visit Number 4    Date for PT Re-Evaluation 01/06/21    Authorization Type [employer] employee    PT Start Time 1015    PT Stop Time 1055    PT Time Calculation (min) 40 min    Activity Tolerance Patient tolerated treatment well    Behavior During Therapy [facility] for tasks assessed/performed             Past Medical History:  Diagnosis Date   Acute bronchitis 05/25/2016   Anxiety    Arthritis    Chronic pain syndrome 01/06/2013   Chronic rhinosinusitis    Colon polyps    Cough 01/06/2013   Depression    Diabetes ([place]) 01/06/2013   Diabetes mellitus type 2 in obese [facility]) 01/06/2013   Sees Dr Calvert Cantor for eye exam Does not see podiatry, foot exam today unremarkable except for thick cracking skin on heals  pt denies    Dyspnea    with exertion    Dysuria 05/25/2016   Edema 01/06/2013   Epistaxis 05/25/2016   Fibroid, uterine    Fibromyalgia    GERD (gastroesophageal reflux disease)    Glaucoma 12-13   Hoarseness    Hoarseness of voice 05/11/2013   Hyperglycemia 04/13/2013   Hyperlipemia, mixed 06/06/2007   Qualifier: Diagnosis of  By: Wynona Luna She feels Lipitor caused increased low back pain and weakness     Hyperlipidemia    Hypertension    Hypokalemia 02/05/2013   IBS (irritable bowel syndrome) 02/13/2011   Low back pain 03/03/2013   Muscle cramp 05/22/2014   Obesity    Obesity, unspecified 05/11/2013   OSA (obstructive sleep apnea)    cpap    Pain in joint, shoulder region 06/27/2015   Perimenopause 10/05/2012   Pre-diabetes    Raynaud disease 06/16/2013   Thyroid disease     hypothyroidism   Vocal fold nodules     Past Surgical History:  Procedure Laterality Date   ABDOMINAL HYSTERECTOMY     BREAST EXCISIONAL BIOPSY Right    at age 2   CHOLECYSTECTOMY     colonoscopoy      [place]     x2   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   Procedure: LAPAROSCOPIC ROUX-EN-Y GASTRIC BYPASS WITH UPPER ENDOSCOPY;  Surgeon: [name], MD;  Location: [facility];  Service: General;  Laterality: N/A;   I & D EXTREMITY Left 04/11/2018   Procedure: DEBIDMENT DISTAL INTERPHALANGEAL LEFT MIDDLE;  Surgeon: Daryll Brod, MD;  Location: [place];  Service: Orthopedics;  Laterality: Left;   INCISIONAL HERNIA REPAIR  08/10/2020   Procedure: LAPAROSCOPIC PRIMARY REPAIR OF INCISIONAL HERNIA;  Surgeon: [name], MD;  Location: [facility];  Service: General;;   MASS EXCISION Left 04/11/2018   Procedure: EXCISION MASS;  Surgeon: Daryll Brod, MD;  Location: [place];  Service: Orthopedics;  Laterality: Left;   OOPHORECTOMY     POLYPECTOMY     ROTATOR CUFF REPAIR     UPPER GI ENDOSCOPY N/A 08/10/2020   Procedure: UPPER GI ENDOSCOPY;  Surgeon: [name], MD;  [facility];  Service: General;  Laterality: N/A;    There were no vitals filed for this visit.   Subjective Assessment - 11/19/20 1201     Subjective Just had a large BM but it still doesn't feel completely emptied    Currently in Pain? No/denies                               [facility] Adult PT Treatment/Exercise - 11/19/20 0001       Self-Care   Other Self-Care Comments  bulging and breathing in seated position - toileting techniques      Manual Therapy   Myofascial Release lumbar and thoracic              Trigger Point Dry Needling - 11/19/20 0001     Consent Given? Yes    Education Handout Provided Previously provided    Lumbar multifidi Response Twitch response elicited;Palpable increased muscle length                     PT Short Term Goals - 11/19/20 1200       PT SHORT TERM GOAL #1   Title able to bulge pelvic floor    Baseline can do this, but is inconsistent    Status Partially Met      PT SHORT TERM GOAL #2   Title pt will report at least 20% less straining    Status On-going               PT Long Term Goals - 10/14/20 1217       PT LONG TERM GOAL #1   Title Pt will report BM at least every 3 days without straining or bearing down    Time 12    Period Weeks    Status New    Target Date 01/06/21      PT LONG TERM GOAL #2   Title Pt will improve lumbar soft tissue mobility for improved low back pain    Time 12    Period Weeks    Status New    Target Date 01/06/21      PT LONG TERM GOAL #3   Title Pt will imporve pelvic floor strength to at least 3/5 and holding for 10 seconds due to improved coordination without breath holding    Time 12    Period Weeks    Status New    Target Date 01/06/21      PT LONG TERM GOAL #4   Title Pt will be ind with HEP to maintain improvements and functional level    Time 12    Period Weeks    Status New    Target Date 01/06/21                   Plan - 11/19/20 1100     Clinical Impression Statement Pt did well with dry needling today with increased soft tissue length.  Today's session also focused on reviewing toileting techniques with TC for bulging.  pt did well needed TC and VC for when she did bulging correctly.  Pt will benefit from skilled PT to continue to work towards functional goals and have more regular and healthy BMs    PT Treatment/Interventions ADLs/Self Care Home Management;Biofeedback;Cryotherapy;Software engineer;Therapeutic activities;Therapeutic exercise;Neuromuscular re-education;Patient/family education;Manual techniques;Taping;Dry needling;Passive range of motion    PT Next Visit Plan review  bulge, biofeedback    PT Home Exercise Plan Access Code: [email or web address]; Access  Code: [email or web address]    Consulted and Agree with Plan of Care Patient             Patient will benefit from skilled therapeutic intervention in order to improve the following deficits and impairments:  Increased muscle spasms, Pain, Postural dysfunction, Decreased endurance, Decreased coordination, Decreased strength  Visit Diagnosis: Cramp and spasm  Chronic low back pain, unspecified back pain laterality, unspecified whether sciatica present  Muscle weakness (generalized)  Unspecified lack of coordination     Problem List Patient Active Problem List   Diagnosis Date Noted   Gastric bypass status for obesity 08/11/2020   Chronic idiopathic constipation 07/15/2020   Severe chronic obstructive pulmonary disease ([place]) 05/26/2020   Preoperative cardiovascular examination 05/13/2020   Mild CAD 05/13/2020   Prediabetes 05/13/2020   Depression    Anal pain 04/12/2020   Dysphonia 04/12/2020   Epigastric pain 04/12/2020   Family history of malignant neoplasm of gastrointestinal tract 04/12/2020   Left lower quadrant pain 04/12/2020   Pruritus ani 04/12/2020   Pure hypercholesterolemia 04/12/2020   Thoracic and lumbosacral neuritis 04/12/2020   Vocal fold nodules    Thyroid disease    Hypertension    GERD (gastroesophageal reflux disease)    Fibroid, uterine    Depressive disorder    Colon polyps    Chronic rhinosinusitis    Anxiety    Hemorrhoids 02/22/2020   Statin intolerance 12/28/2019   Elevated CK 11/12/2019   Lymphadenopathy 11/12/2019   Neck pain 11/04/2019   Cervical radiculopathy 05/19/2019   Numbness and tingling 04/17/2019   Tachycardia 11/57/2620   Umbilical hernia 35/59/7416   Sun-damaged skin 01/19/2019   Preventative health care 01/19/2019   Right arm pain 01/16/2019   Numbness 10/11/2018   Carpal tunnel syndrome of right wrist 09/04/2018   Entrapment of right ulnar nerve 09/04/2018   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/26/2018   Bilateral hand  pain 01/30/2018   Mucoid cyst, joint 01/30/2018   Nodule of finger of both hands 01/10/2018   Weakness 07/03/2017   Shortness of breath on exertion 07/03/2017   Osteoarthritis of spine with radiculopathy, cervical region 07/24/2016   Dysuria 05/25/2016   Epistaxis 05/25/2016   Hypersomnia with sleep apnea 02/18/2016   Severe obesity (BMI >= 40) ([place]) 02/18/2016   Thyroid activity decreased 07/30/2015   Left shoulder pain 06/27/2015   Pain in joint, shoulder region 06/27/2015   Myalgia 03/27/2015   Muscle cramp 05/22/2014   Mass of soft tissue of neck 02/23/2014   AP (abdominal pain) 02/15/2014   Raynaud disease 06/16/2013   Hoarseness of voice 05/11/2013   Obesity, unspecified 05/11/2013   Hyperglycemia 04/13/2013   Backache 03/03/2013   Low back pain 03/03/2013   Hypokalemia 02/05/2013   Edema 01/06/2013   Diabetes mellitus type 2 in obese ([place]) 01/06/2013   Chronic pain syndrome 01/06/2013   Cough 01/06/2013   Diabetes ([place]) 01/06/2013   Perimenopause 10/05/2012   Glaucoma    SOB (shortness of breath) 09/10/2011   Slow transit constipation 02/13/2011   Vitamin D deficiency 02/13/2011   IBS (irritable bowel syndrome) 02/13/2011   Paraesthesia of skin 08/31/2010   RHINITIS, CHRONIC 01/14/2010   Atypical chest pain 06/16/2008   THYROMEGALY 11/11/2007   Allergic rhinitis 11/11/2007   ECZEMA, HANDS 07/22/2007   Hypothyroidism 06/06/2007   Hyperlipemia, mixed 06/06/2007   ADJ DISORDER WITH MIXED ANXIETY & DEPRESSED MOOD 06/06/2007  OSA (obstructive sleep apnea) 06/06/2007   Benign essential hypertension 06/06/2007   COLONIC POLYPS, HX OF 06/06/2007    Jule Ser, PT 11/19/2020, 12:09 PM  [facility] [address] [place], [place], [address] Phone: [phone]   Fax:  [phone]  Name: [name] MRN: [id number] Date of Birth: Sep 28, 1960

## 2020-11-23 ENCOUNTER — Ambulatory Visit (INDEPENDENT_AMBULATORY_CARE_PROVIDER_SITE_OTHER): Payer: 59 | Admitting: Professional

## 2020-11-23 DIAGNOSIS — F411 Generalized anxiety disorder: Secondary | ICD-10-CM | POA: Diagnosis not present

## 2020-11-26 ENCOUNTER — Other Ambulatory Visit: Payer: Self-pay

## 2020-11-26 ENCOUNTER — Encounter: Payer: Self-pay | Admitting: Physical Therapy

## 2020-11-26 ENCOUNTER — Ambulatory Visit: Payer: 59 | Attending: Physician Assistant | Admitting: Physical Therapy

## 2020-11-26 DIAGNOSIS — M6281 Muscle weakness (generalized): Secondary | ICD-10-CM

## 2020-11-26 DIAGNOSIS — G8929 Other chronic pain: Secondary | ICD-10-CM | POA: Diagnosis not present

## 2020-11-26 DIAGNOSIS — R252 Cramp and spasm: Secondary | ICD-10-CM

## 2020-11-26 DIAGNOSIS — R279 Unspecified lack of coordination: Secondary | ICD-10-CM | POA: Diagnosis not present

## 2020-11-26 DIAGNOSIS — M545 Low back pain, unspecified: Secondary | ICD-10-CM | POA: Diagnosis not present

## 2020-11-26 NOTE — Therapy (Signed)
[facility] [address], [place], [place], [address] Phone: [phone]   Fax:  [phone]  Physical Therapy Treatment  Patient Details  Name: [name] MRN: [id number] Date of Birth: [birth year] Referring Provider (PT): Vladimir Crofts, [place]   Encounter Date: 11/26/2020   PT End of Session - 11/26/20 1101     Visit Number 5    Date for PT Re-Evaluation 01/06/21    Authorization Type [employer] employee    PT Start Time 1015    PT Stop Time 1057    PT Time Calculation (min) 42 min    Activity Tolerance Patient tolerated treatment well    Behavior During Therapy [facility] for tasks assessed/performed             Past Medical History:  Diagnosis Date   Acute bronchitis 05/25/2016   Anxiety    Arthritis    Chronic pain syndrome 01/06/2013   Chronic rhinosinusitis    Colon polyps    Cough 01/06/2013   Depression    Diabetes ([place]) 01/06/2013   Diabetes mellitus type 2 in obese ([place]) 01/06/2013   Sees Dr Calvert Cantor for eye exam Does not see podiatry, foot exam today unremarkable except for thick cracking skin on heals  pt denies    Dyspnea    with exertion    Dysuria 05/25/2016   Edema 01/06/2013   Epistaxis 05/25/2016   Fibroid, uterine    Fibromyalgia    GERD (gastroesophageal reflux disease)    Glaucoma 12-13   Hoarseness    Hoarseness of voice 05/11/2013   Hyperglycemia 04/13/2013   Hyperlipemia, mixed 06/06/2007   Qualifier: Diagnosis of  By: Wynona Luna She feels Lipitor caused increased low back pain and weakness     Hyperlipidemia    Hypertension    Hypokalemia 02/05/2013   IBS (irritable bowel syndrome) 02/13/2011   Low back pain 03/03/2013   Muscle cramp 05/22/2014   Obesity    Obesity, unspecified 05/11/2013   OSA (obstructive sleep apnea)    cpap    Pain in joint, shoulder region 06/27/2015   Perimenopause 10/05/2012   Pre-diabetes    Raynaud disease 06/16/2013   Thyroid disease     hypothyroidism   Vocal fold nodules     Past Surgical History:  Procedure Laterality Date   ABDOMINAL HYSTERECTOMY     BREAST EXCISIONAL BIOPSY Right    at age 32   CHOLECYSTECTOMY     colonoscopoy      [place]     x2   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   Procedure: LAPAROSCOPIC ROUX-EN-Y GASTRIC BYPASS WITH UPPER ENDOSCOPY;  Surgeon: [name], MD;  [facility];  Service: General;  Laterality: N/A;   I & D EXTREMITY Left 04/11/2018   Procedure: DEBIDMENT DISTAL INTERPHALANGEAL LEFT MIDDLE;  Surgeon: Daryll Brod, MD;  Location: [facility];  Service: Orthopedics;  Laterality: Left;   INCISIONAL HERNIA REPAIR  08/10/2020   Procedure: LAPAROSCOPIC PRIMARY REPAIR OF INCISIONAL HERNIA;  Surgeon: [name], MD;  Location: [facility];  Service: General;;   MASS EXCISION Left 04/11/2018   Procedure: EXCISION MASS;  Surgeon: Daryll Brod, MD;  Location: [place];  Service: Orthopedics;  Laterality: Left;   OOPHORECTOMY     POLYPECTOMY     ROTATOR CUFF REPAIR     UPPER GI ENDOSCOPY N/A 08/10/2020   Procedure: UPPER GI ENDOSCOPY;  Surgeon: [name], MD;  [facility];  Service: General;  Laterality: N/A;    There were no vitals filed for this visit.   Subjective Assessment - 11/26/20 1019     Subjective A little better    Patient Stated Goals Be able to have BM    Currently in Pain? No/denies                               [facility] Adult PT Treatment/Exercise - 11/26/20 0001       Neuro Re-ed    Neuro Re-ed Details  biofeedback for control and muscle training, builging in sitting without tightening muscles - kegels done in both supine and sitting      Manual Therapy   Myofascial Release lumbar and thoracic                      PT Short Term Goals - 11/19/20 1200       PT SHORT TERM GOAL #1   Title able to bulge pelvic floor    Baseline can do this, but is  inconsistent    Status Partially Met      PT SHORT TERM GOAL #2   Title pt will report at least 20% less straining    Status On-going               PT Long Term Goals - 10/14/20 1217       PT LONG TERM GOAL #1   Title Pt will report BM at least every 3 days without straining or bearing down    Time 12    Period Weeks    Status New    Target Date 01/06/21      PT LONG TERM GOAL #2   Title Pt will improve lumbar soft tissue mobility for improved low back pain    Time 12    Period Weeks    Status New    Target Date 01/06/21      PT LONG TERM GOAL #3   Title Pt will imporve pelvic floor strength to at least 3/5 and holding for 10 seconds due to improved coordination without breath holding    Time 12    Period Weeks    Status New    Target Date 01/06/21      PT LONG TERM GOAL #4   Title Pt will be ind with HEP to maintain improvements and functional level    Time 12    Period Weeks    Status New    Target Date 01/06/21                   Plan - 11/26/20 1103     Clinical Impression Statement Pt did well with biofeedback.  She needs cues to exhale and not bear down when engaging the pelvic floor.  Pt was able to bulge.  Pt will benefit from skilled PT to address core strength so she can exercise for weight management and health without getting excess tension in lumbar and pelvic floor.    PT Treatment/Interventions ADLs/Self Care Home Management;Biofeedback;Cryotherapy;Software engineer;Therapeutic activities;Therapeutic exercise;Neuromuscular re-education;Patient/family education;Manual techniques;Taping;Dry needling;Passive range of motion    PT Next Visit Plan core strength and discuss doing internal STM if not feel much difference from last week    PT Home Exercise Plan Access Code: [email or web address]; Access Code: [email or web address]    Consulted and Agree with Plan  of Care Patient             Patient will benefit from skilled therapeutic  intervention in order to improve the following deficits and impairments:  Increased muscle spasms, Pain, Postural dysfunction, Decreased endurance, Decreased coordination, Decreased strength  Visit Diagnosis: Cramp and spasm  Chronic low back pain, unspecified back pain laterality, unspecified whether sciatica present  Muscle weakness (generalized)  Unspecified lack of coordination     Problem List Patient Active Problem List   Diagnosis Date Noted   Gastric bypass status for obesity 08/11/2020   Chronic idiopathic constipation 07/15/2020   Severe chronic obstructive pulmonary disease ([place]) 05/26/2020   Preoperative cardiovascular examination 05/13/2020   Mild CAD 05/13/2020   Prediabetes 05/13/2020   Depression    Anal pain 04/12/2020   Dysphonia 04/12/2020   Epigastric pain 04/12/2020   Family history of malignant neoplasm of gastrointestinal tract 04/12/2020   Left lower quadrant pain 04/12/2020   Pruritus ani 04/12/2020   Pure hypercholesterolemia 04/12/2020   Thoracic and lumbosacral neuritis 04/12/2020   Vocal fold nodules    Thyroid disease    Hypertension    GERD (gastroesophageal reflux disease)    Fibroid, uterine    Depressive disorder    Colon polyps    Chronic rhinosinusitis    Anxiety    Hemorrhoids 02/22/2020   Statin intolerance 12/28/2019   Elevated CK 11/12/2019   Lymphadenopathy 11/12/2019   Neck pain 11/04/2019   Cervical radiculopathy 05/19/2019   Numbness and tingling 04/17/2019   Tachycardia 25/75/0518   Umbilical hernia 33/58/2518   Sun-damaged skin 01/19/2019   Preventative health care 01/19/2019   Right arm pain 01/16/2019   Numbness 10/11/2018   Carpal tunnel syndrome of right wrist 09/04/2018   Entrapment of right ulnar nerve 09/04/2018   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/26/2018   Bilateral hand pain 01/30/2018   Mucoid cyst, joint 01/30/2018   Nodule of finger of both hands 01/10/2018   Weakness 07/03/2017   Shortness  of breath on exertion 07/03/2017   Osteoarthritis of spine with radiculopathy, cervical region 07/24/2016   Dysuria 05/25/2016   Epistaxis 05/25/2016   Hypersomnia with sleep apnea 02/18/2016   Severe obesity (BMI >= 40) ([place]) 02/18/2016   Thyroid activity decreased 07/30/2015   Left shoulder pain 06/27/2015   Pain in joint, shoulder region 06/27/2015   Myalgia 03/27/2015   Muscle cramp 05/22/2014   Mass of soft tissue of neck 02/23/2014   AP (abdominal pain) 02/15/2014   Raynaud disease 06/16/2013   Hoarseness of voice 05/11/2013   Obesity, unspecified 05/11/2013   Hyperglycemia 04/13/2013   Backache 03/03/2013   Low back pain 03/03/2013   Hypokalemia 02/05/2013   Edema 01/06/2013   Diabetes mellitus type 2 in obese ([place]) 01/06/2013   Chronic pain syndrome 01/06/2013   Cough 01/06/2013   Diabetes ([place]) 01/06/2013   Perimenopause 10/05/2012   Glaucoma    SOB (shortness of breath) 09/10/2011   Slow transit constipation 02/13/2011   Vitamin D deficiency 02/13/2011   IBS (irritable bowel syndrome) 02/13/2011   Paraesthesia of skin 08/31/2010   RHINITIS, CHRONIC 01/14/2010   Atypical chest pain 06/16/2008   THYROMEGALY 11/11/2007   Allergic rhinitis 11/11/2007   ECZEMA, HANDS 07/22/2007   Hypothyroidism 06/06/2007   Hyperlipemia, mixed 06/06/2007   ADJ DISORDER WITH MIXED ANXIETY & DEPRESSED MOOD 06/06/2007   OSA (obstructive sleep apnea) 06/06/2007   Benign essential hypertension 06/06/2007   COLONIC POLYPS, HX OF 06/06/2007    Janey Genta L  Na [place], PT 11/26/2020, 11:06 AM  [facility] [address], [place], [place], [address] Phone: [phone]   Fax:  [phone]  Name: [name] MRN: [id number] Date of Birth: July 17, 1960

## 2020-11-29 ENCOUNTER — Encounter: Payer: Self-pay | Admitting: Family Medicine

## 2020-11-29 ENCOUNTER — Encounter (HOSPITAL_COMMUNITY): Payer: Self-pay | Admitting: Psychiatry

## 2020-11-29 ENCOUNTER — Telehealth (INDEPENDENT_AMBULATORY_CARE_PROVIDER_SITE_OTHER): Payer: 59 | Admitting: Psychiatry

## 2020-11-29 ENCOUNTER — Other Ambulatory Visit (HOSPITAL_COMMUNITY): Payer: Self-pay

## 2020-11-29 ENCOUNTER — Other Ambulatory Visit: Payer: Self-pay | Admitting: Family Medicine

## 2020-11-29 DIAGNOSIS — M797 Fibromyalgia: Secondary | ICD-10-CM

## 2020-11-29 DIAGNOSIS — F411 Generalized anxiety disorder: Secondary | ICD-10-CM | POA: Diagnosis not present

## 2020-11-29 MED ORDER — METOPROLOL TARTRATE 25 MG PO TABS: 12.5000 mg | ORAL_TABLET | Freq: Two times a day (BID) | ORAL | 0 refills | Status: DC

## 2020-11-29 MED ORDER — BUSPIRONE HCL 7.5 MG PO TABS
7.5000 mg | ORAL_TABLET | Freq: Three times a day (TID) | ORAL | 0 refills | Status: DC
  Filled 2020-11-29: qty 90, 30d supply, fill #0

## 2020-11-29 MED ORDER — AMLODIPINE BESYLATE 5 MG PO TABS
5.0000 mg | ORAL_TABLET | Freq: Every day | ORAL | 2 refills | Status: DC
  Filled 2020-11-29: qty 30, 30d supply, fill #0
  Filled 2020-12-24: qty 30, 30d supply, fill #1
  Filled 2021-01-24: qty 30, 30d supply, fill #2

## 2020-11-29 NOTE — Progress Notes (Signed)
[facility] Follow up visit  Patient Identification: [name] MRN:  [id number] Date of Evaluation:  11/29/2020 Referral Source: Primary care Chief Complaint:  follow up anxiety Visit Diagnosis:    ICD-10-CM   1. GAD (generalized anxiety disorder)  F41.1     2. Fibromyalgia  M79.7      Virtual Visit via Video Note  I connected with [name] on 11/29/20 at  1:00 PM EDT by a video enabled telemedicine application and verified that I am speaking with the correct person using two identifiers.  Location: Patient: home Provider: home office   I discussed the limitations of evaluation and management by telemedicine and the availability of in person appointments. The patient expressed understanding and agreed to proceed.      I discussed the assessment and treatment plan with the patient. The patient was provided an opportunity to ask questions and all were answered. The patient agreed with the plan and demonstrated an understanding of the instructions.   The patient was advised to call back or seek an in-person evaluation if the symptoms worsen or if the condition fails to improve as anticipated.  I provided 11 minutes of non-face-to-face time during this encounter.     History of Present Illness: Patient is a 60 years old female lives with her significant other initially referred by primary care physician for establish care for anxiety.  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  Does not have any kids   Has been on Ativan Xanax before, Effexor.  Patient has had COVID and recovered but apparently started having anxiety so she wanted to adjust medication BuSpar was increased to 3 times a day now she is doing better Her job has been stressful seeing people dying of COVID that that has caused anxiety as well   She is now working 3 days a week  [employer] for fibromyalgia   She follows with Francie Massing for therapy   Her significant other  relationship is going on well.     She uses a CPAP machine at night  Aggravating factors; job stress Modifying factors; significant other Duration since adult life      Past Psychiatric History: anxiety  Previous Psychotropic Medications: Yes  Effexor, xanax  Past Medical History:  Past Medical History:  Diagnosis Date   Acute bronchitis 05/25/2016   Anxiety    Arthritis    Chronic pain syndrome 01/06/2013   Chronic rhinosinusitis    Colon polyps    Cough 01/06/2013   Depression    Diabetes ([place]) 01/06/2013   Diabetes mellitus type 2 in obese ([place]) 01/06/2013   Sees Dr Calvert Cantor for eye exam Does not see podiatry, foot exam today unremarkable except for thick cracking skin on heals  pt denies    Dyspnea    with exertion    Dysuria 05/25/2016   Edema 01/06/2013   Epistaxis 05/25/2016   Fibroid, uterine    Fibromyalgia    GERD (gastroesophageal reflux disease)    Glaucoma 12-13   Hoarseness    Hoarseness of voice 05/11/2013   Hyperglycemia 04/13/2013   Hyperlipemia, mixed 06/06/2007   Qualifier: Diagnosis of  By: Wynona Luna She feels Lipitor caused increased low back pain and weakness     Hyperlipidemia    Hypertension    Hypokalemia 02/05/2013   IBS (irritable bowel syndrome) 02/13/2011   Low back pain 03/03/2013   Muscle cramp 05/22/2014   Obesity  Obesity, unspecified 05/11/2013   OSA (obstructive sleep apnea)    cpap    Pain in joint, shoulder region 06/27/2015   Perimenopause 10/05/2012   Pre-diabetes    Raynaud disease 06/16/2013   Thyroid disease    hypothyroidism   Vocal fold nodules     Past Surgical History:  Procedure Laterality Date   ABDOMINAL HYSTERECTOMY     BREAST EXCISIONAL BIOPSY Right    at age 28   CHOLECYSTECTOMY     colonoscopoy      [place]     x2   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   Procedure: LAPAROSCOPIC ROUX-EN-Y GASTRIC BYPASS WITH UPPER ENDOSCOPY;  Surgeon: [name], MD;   [facility];  Service: General;  Laterality: N/A;   I & D EXTREMITY Left 04/11/2018   Procedure: DEBIDMENT DISTAL INTERPHALANGEAL LEFT MIDDLE;  Surgeon: Daryll Brod, MD;  Location: [place];  Service: Orthopedics;  Laterality: Left;   INCISIONAL HERNIA REPAIR  08/10/2020   Procedure: LAPAROSCOPIC PRIMARY REPAIR OF INCISIONAL HERNIA;  Surgeon: [name], MD;  Location: [facility];  Service: General;;   MASS EXCISION Left 04/11/2018   Procedure: EXCISION MASS;  Surgeon: Daryll Brod, MD;  Location: [place];  Service: Orthopedics;  Laterality: Left;   OOPHORECTOMY     POLYPECTOMY     ROTATOR CUFF REPAIR     UPPER GI ENDOSCOPY N/A 08/10/2020   Procedure: UPPER GI ENDOSCOPY;  Surgeon: [name], MD;  [facility];  Service: General;  Laterality: N/A;    Family Psychiatric History: dad : alcohol use  Family History:  Family History  Problem Relation Age of Onset   Alcohol abuse Father    Cancer Father        renal and colon   Hyperlipidemia Father    Hypertension Father    Stroke Father    Heart disease Father    Cancer Paternal Grandfather        colon   Diabetes Other    High blood pressure Mother    High Cholesterol Mother    Kidney disease Mother    Depression Mother    Anxiety disorder Mother    Obesity Mother    Breast cancer Other 48   Diabetes Sister    Diabetes Brother     Social History:   Social History   Socioeconomic History   Marital status: Significant Other    Spouse name: Not on file   Number of children: 0   Years of education: Not on file   Highest education level: Not on file  Occupational History   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  Tobacco Use   Smoking status: Former    Packs/day: 0.75    Years: 42.00    Pack years: 31.50    Types: Cigarettes    Quit date: 04/24/2005    Years since quitting: 15.6   Smokeless tobacco: Never   Tobacco comments:    smoked since age 86  Vaping Use   Vaping Use:  Never used  Substance and Sexual Activity   Alcohol use: No   Drug use: Never   Sexual activity: Yes    Partners: Male  Other Topics Concern   Not on file  Social History Narrative   Endo-- Dr Dwyane Dee   ENT--Dr shoemaker   GI--Dr Buccini   Pulm--Dr Clance   Rheum--Dr Charlestine Night         Social Determinants of Health  Financial Resource Strain: Not on file  Food Insecurity: Not on file  Transportation Needs: Not on file  Physical Activity: Not on file  Stress: Not on file  Social Connections: Not on file      Allergies:   Allergies  Allergen Reactions   Crestor [Rosuvastatin] Other (See Comments)    Myalgias and rising CPK   Losartan Cough   Wellbutrin [Bupropion] Other (See Comments)    Dizziness and mental status changes   Atorvastatin     myalgias   Amoxicillin Rash   Aspirin Nausea Only and Other (See Comments)    GI upset REACTION: Upset stomach   Codeine Rash   Erythromycin Rash and Other (See Comments)   Penicillins Rash    Reaction: 15 years    Metabolic Disorder Labs: Lab Results  Component Value Date   HGBA1C 5.9 (H) 08/03/2020   MPG 122.63 08/03/2020   MPG 137 02/13/2020   No results found for: PROLACTIN Lab Results  Component Value Date   CHOL 77 (L) 10/11/2020   TRIG 80 10/11/2020   HDL 43 10/11/2020   CHOLHDL 1.8 10/11/2020   VLDL 43.2 (H) 11/11/2019   LDLCALC 17 10/11/2020   LDLCALC 175 (H) 06/22/2020   LDLCALC 162 (H) 06/22/2020   Lab Results  Component Value Date   TSH 1.02 02/13/2020    Therapeutic Level Labs: No results found for: LITHIUM No results found for: CBMZ No results found for: VALPROATE  Current Medications: Current Outpatient Medications  Medication Sig Dispense Refill   albuterol (VENTOLIN HFA) 108 (90 Base) MCG/ACT inhaler INHALE 2 PUFFS INTO THE LUNGS EVERY 4 (FOUR) HOURS AS NEEDED FOR WHEEZING OR SHORTNESS OF BREATH. 18 g 5   busPIRone (BUSPAR) 7.5 MG tablet Take 1 tablet (7.5 mg total) by mouth 3 (three)  times daily. 90 tablet 0   Calcium Carbonate 500 MG CHEW Chew 500 mg by mouth daily at 12 noon.     Cod Liver Oil 1000 MG CAPS Take 1,000 mg by mouth daily.     diphenhydrAMINE (BENADRYL) 25 MG tablet Take 25 mg by mouth every 6 (six) hours as needed.     Evolocumab 140 MG/ML SOAJ INJECT 1 DOSE INTO THE SKIN EVERY 14 (FOURTEEN) DAYS. (Patient taking differently: Inject 140 mg into the skin every 14 (fourteen) days.) 6 mL 3   furosemide (LASIX) 20 MG tablet Take 1 tablet (20 mg total) by mouth daily as needed. 90 tablet 1   ipratropium (ATROVENT) 0.06 % nasal spray Place 2 sprays into the nose daily.     loratadine (CLARITIN) 10 MG tablet Take 20 mg by mouth daily.     metoprolol tartrate (LOPRESSOR) 25 MG tablet Take 1 tablet (25 mg total) by mouth 2 (two) times daily. 180 tablet 0   Milnacipran (SAVELLA) 50 MG TABS tablet Take 1 tablet (50 mg total) by mouth 2 (two) times daily. 180 tablet 1   modafinil (PROVIGIL) 200 MG tablet TAKE 2 TABLETS BY MOUTH DAILY 180 tablet 1   Multiple Vitamins-Minerals (MULTIVITAMIN WITH MINERALS) tablet Take 1 tablet by mouth daily.     pantoprazole (PROTONIX) 40 MG tablet Take 1 tablet (40 mg total) by mouth daily. 90 tablet 0   Plecanatide (TRULANCE) 3 MG TABS Take 3 mg by mouth daily.     promethazine-dextromethorphan (PROMETHAZINE-DM) 6.25-15 MG/5ML syrup Take 5 mLs by mouth 4 (four) times daily as needed for cough. 118 mL 0   ranolazine (RANEXA) 500 MG 12 hr tablet Take 1  tablet (500 mg total) by mouth 2 (two) times daily. 180 tablet 2   spironolactone (ALDACTONE) 50 MG tablet Take 1 tablet (50 mg total) by mouth 3 (three) times daily. 270 tablet 3   thyroid (ARMOUR) 90 MG tablet Take 1 tablet (90 mg total) by mouth daily. 90 tablet 4   Vitamin D, Ergocalciferol, (DRISDOL) 1.25 MG (50000 UNIT) CAPS capsule Take 1 capsule (50,000 Units total) by mouth every 7 (seven) days. 12 capsule 1   No current facility-administered medications for this visit.      Psychiatric Specialty Exam: Review of Systems  Psychiatric/Behavioral:  Negative for agitation and self-injury.    There were no vitals taken for this visit.There is no height or weight on file to calculate BMI.  General Appearance: Casual  Eye Contact:  Fair  Speech:  Slow  Volume:  Decreased  Mood:  Euthymic  Affect:  Constricted  Thought Process:  Goal Directed  Orientation:  Full (Time, Place, and Person)  Thought Content:  Rumination  Suicidal Thoughts:  No  Homicidal Thoughts:  No  Memory:  Immediate;   Fair Recent;   Fair  Judgement:  Fair  Insight:  Fair  Psychomotor Activity:  Decreased  Concentration:  Concentration: Fair and Attention Span: Fair  Recall:  [employer] of Knowledge:Fair  Language: Fair  Akathisia:  No  Handed:    AIMS (if indicated):  not done  Assets:  Desire for Improvement Financial Resources/Insurance  ADL's:  Intact  Cognition: WNL  Sleep:  Fair   Screenings: GAD-7    Flowsheet Row Office Visit from 03/22/2015 in [facility] at [employer]  Total GAD-7 Score 0      PHQ2-9    Peter Visit from 09/09/2020 in [place] [facility] from [facility]/24/2020 [facility] Video Visit from 06/29/2020 in [facility] from 03/02/2020 in [facility] Office Visit from 07/03/2017 in [place]  PHQ-2 Total Score 0 0 1 0 0  PHQ-9 Total Score 0 -- -- -- 4      Flowsheet Row Video Visit from 11/29/2020 in [facility] Video Visit from 10/26/2020 in [facility] Office Visit from 09/14/2020 in [place] No Risk No Risk No Risk       Assessment and Plan:  As follows Prior documentation reviewed  GAD: Doing fair  on BuSpar now 3 times a day we will continue that continue therapy discussed about job stress and provided supportive therapy  Discussed ME time and distractions  Fibromyalgia.  Manageable Follow-up in 2 3 months or earlier if needed    Merian Capron, MD 8/8/20221:08 PM

## 2020-12-07 ENCOUNTER — Other Ambulatory Visit (HOSPITAL_COMMUNITY): Payer: Self-pay

## 2020-12-07 DIAGNOSIS — I7300 Raynaud's syndrome without gangrene: Secondary | ICD-10-CM | POA: Insufficient documentation

## 2020-12-07 MED FILL — Evolocumab Subcutaneous Soln Auto-Injector 140 MG/ML: SUBCUTANEOUS | 84 days supply | Qty: 6 | Fill #1 | Status: AC

## 2020-12-08 ENCOUNTER — Other Ambulatory Visit (HOSPITAL_COMMUNITY): Payer: Self-pay

## 2020-12-08 DIAGNOSIS — M5442 Lumbago with sciatica, left side: Secondary | ICD-10-CM | POA: Diagnosis not present

## 2020-12-08 DIAGNOSIS — K5904 Chronic idiopathic constipation: Secondary | ICD-10-CM | POA: Diagnosis not present

## 2020-12-08 MED ORDER — LUBIPROSTONE 24 MCG PO CAPS
24.0000 ug | ORAL_CAPSULE | Freq: Two times a day (BID) | ORAL | 0 refills | Status: DC
  Filled 2020-12-08: qty 60, 30d supply, fill #0

## 2020-12-10 ENCOUNTER — Other Ambulatory Visit: Payer: Self-pay

## 2020-12-10 ENCOUNTER — Ambulatory Visit: Payer: 59 | Admitting: Physical Therapy

## 2020-12-10 DIAGNOSIS — G8929 Other chronic pain: Secondary | ICD-10-CM | POA: Diagnosis not present

## 2020-12-10 DIAGNOSIS — M6281 Muscle weakness (generalized): Secondary | ICD-10-CM

## 2020-12-10 DIAGNOSIS — R279 Unspecified lack of coordination: Secondary | ICD-10-CM

## 2020-12-10 DIAGNOSIS — R252 Cramp and spasm: Secondary | ICD-10-CM

## 2020-12-10 DIAGNOSIS — M545 Low back pain, unspecified: Secondary | ICD-10-CM | POA: Diagnosis not present

## 2020-12-10 NOTE — Therapy (Signed)
[facility] [address], [place], [place], [address] Phone: [phone]   Fax:  [phone]  Physical Therapy Treatment  Patient Details  Name: [name] MRN: [id number] Date of Birth: 06/20/1960 Referring Provider (PT): Vladimir Crofts, [place]   Encounter Date: 12/10/2020   PT End of Session - 12/10/20 1116     Visit Number 6    Date for PT Re-Evaluation 01/06/21    Authorization Type [employer] employee    PT Start Time 1015    PT Stop Time 1057    PT Time Calculation (min) 42 min    Activity Tolerance Patient tolerated treatment well    Behavior During Therapy [facility] for tasks assessed/performed             Past Medical History:  Diagnosis Date   Acute bronchitis 05/25/2016   Anxiety    Arthritis    Chronic pain syndrome 01/06/2013   Chronic rhinosinusitis    Colon polyps    Cough 01/06/2013   Depression    Diabetes ([place]) 01/06/2013   Diabetes mellitus type 2 in obese ([place]) 01/06/2013   Sees Dr Calvert Cantor for eye exam Does not see podiatry, foot exam today unremarkable except for thick cracking skin on heals  pt denies    Dyspnea    with exertion    Dysuria 05/25/2016   Edema 01/06/2013   Epistaxis 05/25/2016   Fibroid, uterine    Fibromyalgia    GERD (gastroesophageal reflux disease)    Glaucoma 12-13   Hoarseness    Hoarseness of voice 05/11/2013   Hyperglycemia 04/13/2013   Hyperlipemia, mixed 06/06/2007   Qualifier: Diagnosis of  By: Wynona Luna She feels Lipitor caused increased low back pain and weakness     Hyperlipidemia    Hypertension    Hypokalemia 02/05/2013   IBS (irritable bowel syndrome) 02/13/2011   Low back pain 03/03/2013   Muscle cramp 05/22/2014   Obesity    Obesity, unspecified 05/11/2013   OSA (obstructive sleep apnea)    cpap    Pain in joint, shoulder region 06/27/2015   Perimenopause 10/05/2012   Pre-diabetes    Raynaud disease 06/16/2013   Thyroid disease     hypothyroidism   Vocal fold nodules     Past Surgical History:  Procedure Laterality Date   ABDOMINAL HYSTERECTOMY     BREAST EXCISIONAL BIOPSY Right    at age 8   CHOLECYSTECTOMY     colonoscopoy      [place]     x2   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   Procedure: LAPAROSCOPIC ROUX-EN-Y GASTRIC BYPASS WITH UPPER ENDOSCOPY;  Surgeon: [name], MD;  [facility];  Service: General;  Laterality: N/A;   I & D EXTREMITY Left 04/11/2018   Procedure: DEBIDMENT DISTAL INTERPHALANGEAL LEFT MIDDLE;  Surgeon: Daryll Brod, MD;  Location: [place];  Service: Orthopedics;  Laterality: Left;   INCISIONAL HERNIA REPAIR  08/10/2020   Procedure: LAPAROSCOPIC PRIMARY REPAIR OF INCISIONAL HERNIA;  Surgeon: [name], MD;  Location: [facility];  Service: General;;   MASS EXCISION Left 04/11/2018   Procedure: EXCISION MASS;  Surgeon: Daryll Brod, MD;  Location: [place];  Service: Orthopedics;  Laterality: Left;   OOPHORECTOMY     POLYPECTOMY     ROTATOR CUFF REPAIR     UPPER GI ENDOSCOPY N/A 08/10/2020   Procedure: UPPER GI ENDOSCOPY;  Surgeon: [name], MD;  [facility];  Service: General;  Laterality: N/A;    There were no vitals filed for this visit.   Subjective Assessment - 12/10/20 1020     Subjective My left back has been really sore maybe from straining.    Patient Stated Goals Be able to have BM    Currently in Pain? Yes    Pain Score --   did not provide a number   Pain Location Back    Pain Orientation Left    Pain Type Acute pain;Chronic pain    Pain Radiating Towards left hip and thigh    Pain Onset 1 to 4 weeks ago    Pain Frequency Intermittent    Aggravating Factors  walking and sitting    Pain Relieving Factors moving around after sitting    Multiple Pain Sites No                               [facility] Adult PT Treatment/Exercise - 12/10/20 0001       Neuro Re-ed     Neuro Re-ed Details  abdominal activation      Lumbar Exercises: Stretches   Press Ups 10 reps;5 seconds   with overpressure, more centralized and less pain   Other Lumbar Stretch Exercise lumbar extension agains the wall - not as much response but no increased pain      Manual Therapy   Myofascial Release bilateral gluteals and lumbar paraspinlas              Trigger Point Dry Needling - 12/10/20 0001     Consent Given? Yes    Education Handout Provided Previously provided    Muscles Treated Back/Hip Gluteus medius;Piriformis    Dry Needling Comments Left side only    Piriformis Response Twitch response elicited;Palpable increased muscle length                  PT Education - 12/10/20 1057     Education Details Access Code: [email or web address]    Person(s) Educated Patient    Methods Explanation;Demonstration;Tactile cues;Verbal cues;Handout    Comprehension Verbalized understanding;Returned demonstration              PT Short Term Goals - 12/10/20 1117       PT SHORT TERM GOAL #1   Title able to bulge pelvic floor    Baseline can do this, but is inconsistent    Status Partially Met      PT SHORT TERM GOAL #2   Title pt will report at least 20% less straining    Baseline better    Status Partially Met               PT Long Term Goals - 12/10/20 1117       PT LONG TERM GOAL #1   Title Pt will report BM at least every 3 days without straining or bearing down    Baseline did 2 days now    Status Partially Met      PT LONG TERM GOAL #2   Title Pt will improve lumbar soft tissue mobility for improved low back pain    Baseline set back due to strain during exercise class but improved at the end of session with extension exercise    Status Partially Met      PT LONG TERM GOAL #3   Title Pt will imporve pelvic floor strength  to at least 3/5 and holding for 10 seconds due to improved coordination without breath holding    Status On-going      PT LONG  TERM GOAL #4   Title Pt will be ind with HEP to maintain improvements and functional level    Status On-going                   Plan - 12/10/20 1118     Clinical Impression Statement Pt had a setback since previous visit as she was at her exercise class and did something that caused her to have a hard time even getting out of bed for 2 days.  Pt was walking with antalgic gait on Lt side coming into the clinic and noticeably difficulty getting up out of chair to head back to the treatment room.  She had reduced symptoms and much less gait deviations after today's treatment. Pt given prone press up for symptom reduction at home and educated in core activation    PT Treatment/Interventions ADLs/Self Care Home Management;Biofeedback;Cryotherapy;Software engineer;Therapeutic activities;Therapeutic exercise;Neuromuscular re-education;Patient/family education;Manual techniques;Taping;Dry needling;Passive range of motion    PT Next Visit Plan core strength f/u on exacerbated back pain and if prone press up helped; and discuss doing internal STM or biofeedback if not feel much difference from last week    PT Home Exercise Plan Access Code: [email or web address]    Consulted and Agree with Plan of Care Patient             Patient will benefit from skilled therapeutic intervention in order to improve the following deficits and impairments:  Increased muscle spasms, Pain, Postural dysfunction, Decreased endurance, Decreased coordination, Decreased strength  Visit Diagnosis: Cramp and spasm  Chronic low back pain, unspecified back pain laterality, unspecified whether sciatica present  Muscle weakness (generalized)  Unspecified lack of coordination     Problem List Patient Active Problem List   Diagnosis Date Noted   Gastric bypass status for obesity 08/11/2020   Chronic idiopathic constipation 07/15/2020   Severe chronic obstructive pulmonary disease ([place])  05/26/2020   Preoperative cardiovascular examination 05/13/2020   Mild CAD 05/13/2020   Prediabetes 05/13/2020   Depression    Anal pain 04/12/2020   Dysphonia 04/12/2020   Epigastric pain 04/12/2020   Family history of malignant neoplasm of gastrointestinal tract 04/12/2020   Left lower quadrant pain 04/12/2020   Pruritus ani 04/12/2020   Pure hypercholesterolemia 04/12/2020   Thoracic and lumbosacral neuritis 04/12/2020   Vocal fold nodules    Thyroid disease    Hypertension    GERD (gastroesophageal reflux disease)    Fibroid, uterine    Depressive disorder    Colon polyps    Chronic rhinosinusitis    Anxiety    Hemorrhoids 02/22/2020   Statin intolerance 12/28/2019   Elevated CK 11/12/2019   Lymphadenopathy 11/12/2019   Neck pain 11/04/2019   Cervical radiculopathy 05/19/2019   Numbness and tingling 04/17/2019   Tachycardia 40/01/2724   Umbilical hernia 36/64/4034   Sun-damaged skin 01/19/2019   Preventative health care 01/19/2019   Right arm pain 01/16/2019   Numbness 10/11/2018   Carpal tunnel syndrome of right wrist 09/04/2018   Entrapment of right ulnar nerve 09/04/2018   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/26/2018   Bilateral hand pain 01/30/2018   Mucoid cyst, joint 01/30/2018   Nodule of finger of both hands 01/10/2018   Weakness 07/03/2017   Shortness of breath on exertion 07/03/2017   Osteoarthritis of spine with radiculopathy,  cervical region 07/24/2016   Dysuria 05/25/2016   Epistaxis 05/25/2016   Hypersomnia with sleep apnea 02/18/2016   Severe obesity (BMI >= 40) ([place]) 02/18/2016   Thyroid activity decreased 07/30/2015   Left shoulder pain 06/27/2015   Pain in joint, shoulder region 06/27/2015   Myalgia 03/27/2015   Muscle cramp 05/22/2014   Mass of soft tissue of neck 02/23/2014   AP (abdominal pain) 02/15/2014   Raynaud disease 06/16/2013   Hoarseness of voice 05/11/2013   Obesity, unspecified 05/11/2013   Hyperglycemia 04/13/2013    Backache 03/03/2013   Low back pain 03/03/2013   Hypokalemia 02/05/2013   Edema 01/06/2013   Diabetes mellitus type 2 in obese ([place]) 01/06/2013   Chronic pain syndrome 01/06/2013   Cough 01/06/2013   Diabetes ([place]) 01/06/2013   Perimenopause 10/05/2012   Glaucoma    SOB (shortness of breath) 09/10/2011   Slow transit constipation 02/13/2011   Vitamin D deficiency 02/13/2011   IBS (irritable bowel syndrome) 02/13/2011   Paraesthesia of skin 08/31/2010   RHINITIS, CHRONIC 01/14/2010   Atypical chest pain 06/16/2008   THYROMEGALY 11/11/2007   Allergic rhinitis 11/11/2007   ECZEMA, HANDS 07/22/2007   Hypothyroidism 06/06/2007   Hyperlipemia, mixed 06/06/2007   ADJ DISORDER WITH MIXED ANXIETY & DEPRESSED MOOD 06/06/2007   OSA (obstructive sleep apnea) 06/06/2007   Benign essential hypertension 06/06/2007   COLONIC POLYPS, HX OF 06/06/2007    Camillo Flaming Cane Dubray, PT 12/10/2020, 11:33 AM  [facility] [address], [place], [place], [address] Phone: [phone]   Fax:  [phone]  Name: [name] MRN: [id number] Date of Birth: 12-Apr-1961

## 2020-12-10 NOTE — Patient Instructions (Signed)
Access Code: [email or web address] URL: [email or web address] Date: 12/10/2020 Prepared by: Jari Favre  Exercises Supine Butterfly Groin Stretch - 1 x daily - 7 x weekly - 1 sets - 3 reps - 30 sec hold Supine Figure 4 Piriformis Stretch - 1 x daily - 7 x weekly - 1 sets - 3 reps - 30 sec hold Supine Hamstring Stretch - 1 x daily - 7 x weekly - 1 sets - 3 reps - 30 sec hold Supine Pelvic Floor Stretch - Hands on Knees - 1 x daily - 7 x weekly - 1 sets - 3 reps - 30 hold Dead Bug - 1 x daily - 7 x weekly - 3 sets - 10 reps Standing Hip Flexor Stretch - 3 x daily - 7 x weekly - 2 reps - 1 sets - 30 sec hold Static Prone on Elbows - 1 x daily - 7 x weekly - 3 sets - 10 reps

## 2020-12-14 ENCOUNTER — Ambulatory Visit (INDEPENDENT_AMBULATORY_CARE_PROVIDER_SITE_OTHER): Payer: 59

## 2020-12-14 ENCOUNTER — Other Ambulatory Visit: Payer: Self-pay

## 2020-12-14 ENCOUNTER — Ambulatory Visit: Payer: 59 | Admitting: Family Medicine

## 2020-12-14 ENCOUNTER — Encounter: Payer: Self-pay | Admitting: Family Medicine

## 2020-12-14 VITALS — BP 130/84 | HR 84 | Ht 64.5 in | Wt 195.4 lb

## 2020-12-14 DIAGNOSIS — M5442 Lumbago with sciatica, left side: Secondary | ICD-10-CM

## 2020-12-14 DIAGNOSIS — R29898 Other symptoms and signs involving the musculoskeletal system: Secondary | ICD-10-CM

## 2020-12-14 DIAGNOSIS — M545 Low back pain, unspecified: Secondary | ICD-10-CM | POA: Diagnosis not present

## 2020-12-14 NOTE — Progress Notes (Signed)
I, Wendy Poet, LAT, ATC, am serving as scribe for Dr. Lynne Leader.  [name] is a 60 y.o. female who presents to [place] at [facility] today for low back pain x 3 weeks. Pt was previously seen by Dr. Georgina Snell on 06/20/19 for cervical radiculopathy. Today, pt locates pain to her lower back.  She had gastric bypass surgery in March.  She started doing the BELT program at [facility] and feels that this may have contributed to her symptoms.  She has been going to PT for pelvic floor PT.  She locates her pain to her lower back that radiates into her L post LE to her foot and toes.  Radiating pain: yes into her L post LE to her foot LE numbness/tingling: yes in her L 2nd/3rd toe Aggravates: prolonged sitting; transitioning from sit-to-stand; prolonged standing/walking Treatments tried: ice; TENs; HEP per PT; Tylenol    Pertinent review of systems: No fevers or chills.  Unable to tell if bowel dysfunction as this is a somewhat chronic issue for her.  No urinary dysfunction.  Relevant historical information: Attending pelvic physical therapy for rectal dysfunction.   Exam:  BP 130/84 (BP Location: Right Arm, Patient Position: Sitting, Cuff Size: Normal)   Pulse 84   Ht 5' 4.5" (1.638 m)   Wt 195 lb 6.4 oz (88.6 kg)   SpO2 96%   BMI 33.02 kg/m  General: Well Developed, well nourished, and in no acute distress.   MSK: L-spine nontender midline.  Decreased lumbar motion. Lower extremity strength diminished Lower extremity strength: Hip flexion right 5/5 left 3+/5 Hip abduction right 5/5 left 4/5 Hip adduction right 5/5 left 4/5 Knee extension right 5/5 left 3+/5 Knee flexion right 5/5 left 4+/5 Foot dorsiflexion right 5/5 left 4/5 Great toe dorsiflexion right 5/5 left 3/5 Foot plantar flexion right 5/5 left 5/5  Reflexes are intact bilaterally. Sensation decreased left compared to right.    Lab and Radiology Results  X-ray images L-spine obtained today  personally and independently interpreted DDD T10-11.  No severe DJD elsewhere.  No acute fractures visible. Await formal radiology review  EXAM: MRI LUMBAR SPINE WITHOUT CONTRAST   TECHNIQUE: Multiplanar, multisequence MR imaging of the lumbar spine was performed. No intravenous contrast was administered.   COMPARISON:  04/30/2005   FINDINGS: The vertebral bodies of the lumbar spine are normal in size. The vertebral bodies of the lumbar spine are normal in alignment. There is normal bone marrow signal demonstrated throughout the vertebra. There is disc desiccation and mild disc height loss at L4-5. The remainder the disc spaces are maintained.   The spinal cord is normal in signal and contour. The cord terminates normally at L1 . The nerve roots of the cauda equina and the filum terminale are normal.   The visualized portions of the SI joints are unremarkable.   The imaged intra-abdominal contents are unremarkable.   T12-L1: No significant disc bulge. No evidence of neural foraminal stenosis. No central canal stenosis.   L1-L2: No significant disc bulge. No evidence of neural foraminal stenosis. No central canal stenosis.   L2-L3: Mild broad-based disc bulge. Mild bilateral facet arthropathy. No evidence of neural foraminal stenosis. No central canal stenosis.   L3-L4: Mild broad-based disc bulge eccentric towards the right. Mild bilateral facet arthropathy. No evidence of neural foraminal stenosis. No central canal stenosis.   L4-L5: Mild broad-based disc bulge with a small broad central disc protrusion which contacts bilateral intraspinal L5 nerve roots. Mild  bilateral facet arthropathy. Mild spinal stenosis. No foraminal stenosis.   L5-S1: No significant disc bulge. No evidence of neural foraminal stenosis. No central canal stenosis. Mild bilateral facet arthropathy.   IMPRESSION: 1. At L4-5 there is disc desiccation with mild disc height loss. There is a mild  broad-based disc bulge with a small broad central disc protrusion which contacts bilateral intraspinal L5 nerve roots. Mild bilateral facet arthropathy. Mild spinal stenosis. No foraminal stenosis.     Electronically Signed   By: Kathreen Devoid   On: 04/02/2015 09:03   I, Lynne Leader, personally (independently) visualized and performed the interpretation of the images attached in this note.   Assessment and Plan: 60 y.o. female with left leg weakness and paresthesias.  This is a issue ongoing over the last 3 weeks.  This is concerning for left lumbar radiculopathy.  Based on the degree of weakness this is concerning for more significant nerve compression.  Plan for more urgent MRI ideally over the next few days to 1 week.  If worsening significantly will get the MRI emergently in 1 day or so in the emergency room.  Discussed treatment right now.  Patient would like to avoid gabapentin and steroids if possible.  Watchful waiting until MRI is obtained.  Likely plan for epidural steroid injection after MRI.   PDMP not reviewed this encounter. Orders Placed This Encounter  Procedures   DG Lumbar Spine 2-3 Views    Standing Status:   Future    Number of Occurrences:   1    Standing Expiration Date:   01/14/2021    Order Specific Question:   Reason for Exam (SYMPTOM  OR DIAGNOSIS REQUIRED)    Answer:   Low back pain    Order Specific Question:   Is patient pregnant?    Answer:   No    Order Specific Question:   Preferred imaging location?    Answer:   [facility]   MR Lumbar Spine Wo Contrast    Standing Status:   Future    Standing Expiration Date:   12/14/2021    Order Specific Question:   What is the patient's sedation requirement?    Answer:   No Sedation    Order Specific Question:   Does the patient have a pacemaker or implanted devices?    Answer:   No    Order Specific Question:   Preferred imaging location?    Answer:   Product/process development scientist (table limit-350lbs)   No  orders of the defined types were placed in this encounter.    Discussed warning signs or symptoms. Please see discharge instructions. Patient expresses understanding.   The above documentation has been reviewed and is accurate and complete Lynne Leader, M.D.

## 2020-12-14 NOTE — Patient Instructions (Addendum)
Thank you for coming in today.   You should hear from MRI scheduling within 1 week. If you do not hear please let me know.    Anticipate epidural steroid injection after the MRI.   Call or go to the ER if you develop a large red swollen joint with extreme pain or oozing puss.

## 2020-12-15 NOTE — Progress Notes (Signed)
Xray lumbar spine shows mild arthritis changes. MRI will be helpful.

## 2020-12-17 ENCOUNTER — Encounter: Payer: Self-pay | Admitting: Physical Therapy

## 2020-12-17 ENCOUNTER — Other Ambulatory Visit: Payer: Self-pay

## 2020-12-17 ENCOUNTER — Ambulatory Visit: Payer: 59 | Admitting: Physical Therapy

## 2020-12-17 DIAGNOSIS — R252 Cramp and spasm: Secondary | ICD-10-CM

## 2020-12-17 DIAGNOSIS — R279 Unspecified lack of coordination: Secondary | ICD-10-CM | POA: Diagnosis not present

## 2020-12-17 DIAGNOSIS — G8929 Other chronic pain: Secondary | ICD-10-CM

## 2020-12-17 DIAGNOSIS — M545 Low back pain, unspecified: Secondary | ICD-10-CM | POA: Diagnosis not present

## 2020-12-17 DIAGNOSIS — M6281 Muscle weakness (generalized): Secondary | ICD-10-CM

## 2020-12-17 NOTE — Therapy (Signed)
[facility] [address] [place], [place], [address] Phone: [phone]   Fax:  [phone]  Physical Therapy Treatment  Patient Details  Name: [name] MRN: [id number] Date of Birth: January 18, 1961 Referring Provider (PT): Vladimir Crofts, [place]   Encounter Date: 12/17/2020   PT End of Session - 12/17/20 1104     Visit Number 7    Date for PT Re-Evaluation 01/06/21    Authorization Type [employer] employee    PT Start Time 1017    PT Stop Time 1100    PT Time Calculation (min) 43 min    Activity Tolerance Patient tolerated treatment well    Behavior During Therapy [facility] for tasks assessed/performed             Past Medical History:  Diagnosis Date   Acute bronchitis 05/25/2016   Anxiety    Arthritis    Chronic pain syndrome 01/06/2013   Chronic rhinosinusitis    Colon polyps    Cough 01/06/2013   Depression    Diabetes ([place]) 01/06/2013   Diabetes mellitus type 2 in obese ([place]) 01/06/2013   Sees Dr Calvert Cantor for eye exam Does not see podiatry, foot exam today unremarkable except for thick cracking skin on heals  pt denies    Dyspnea    with exertion    Dysuria 05/25/2016   Edema 01/06/2013   Epistaxis 05/25/2016   Fibroid, uterine    Fibromyalgia    GERD (gastroesophageal reflux disease)    Glaucoma 12-13   Hoarseness    Hoarseness of voice 05/11/2013   Hyperglycemia 04/13/2013   Hyperlipemia, mixed 06/06/2007   Qualifier: Diagnosis of  By: Wynona Luna She feels Lipitor caused increased low back pain and weakness     Hyperlipidemia    Hypertension    Hypokalemia 02/05/2013   IBS (irritable bowel syndrome) 02/13/2011   Low back pain 03/03/2013   Muscle cramp 05/22/2014   Obesity    Obesity, unspecified 05/11/2013   OSA (obstructive sleep apnea)    cpap    Pain in joint, shoulder region 06/27/2015   Perimenopause 10/05/2012   Pre-diabetes    Raynaud disease 06/16/2013   Thyroid disease     hypothyroidism   Vocal fold nodules     Past Surgical History:  Procedure Laterality Date   ABDOMINAL HYSTERECTOMY     BREAST EXCISIONAL BIOPSY Right    at age 38   CHOLECYSTECTOMY     colonoscopoy      [place]     x2   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   Procedure: LAPAROSCOPIC ROUX-EN-Y GASTRIC BYPASS WITH UPPER ENDOSCOPY;  Surgeon: [name], MD;  Location: [facility];  Service: General;  Laterality: N/A;   I & D EXTREMITY Left 04/11/2018   Procedure: DEBIDMENT DISTAL INTERPHALANGEAL LEFT MIDDLE;  Surgeon: Daryll Brod, MD;  Location: [place];  Service: Orthopedics;  Laterality: Left;   INCISIONAL HERNIA REPAIR  08/10/2020   Procedure: LAPAROSCOPIC PRIMARY REPAIR OF INCISIONAL HERNIA;  Surgeon: [name], MD;  Location: [facility];  Service: General;;   MASS EXCISION Left 04/11/2018   Procedure: EXCISION MASS;  Surgeon: Daryll Brod, MD;  Location: [place];  Service: Orthopedics;  Laterality: Left;   OOPHORECTOMY     POLYPECTOMY     ROTATOR CUFF REPAIR     UPPER GI ENDOSCOPY N/A 08/10/2020   Procedure: UPPER GI ENDOSCOPY;  Surgeon: [name], MD;  Location: [facility];  Service: General;  Laterality: N/A;    There were no vitals filed for this visit.   Subjective Assessment - 12/17/20 1020     Subjective My left lower back is aching and I am getting an MRI.  Mon and Wednesday had good BMs.  I have been taking the new medicine    Patient Stated Goals Be able to have BM    Currently in Pain? Yes    Pain Score 8     Pain Location Back    Pain Orientation Left    Pain Descriptors / Indicators Aching    Pain Type Acute pain;Chronic pain    Pain Radiating Towards left hip and thigh down to foot sometimes - numbness when this happens    Pain Onset 1 to 4 weeks ago    Aggravating Factors  walking and sitting    Multiple Pain Sites No                               [facility] Adult PT  Treatment/Exercise - 12/17/20 0001       Self-Care   Other Self-Care Comments  reviewed and educated on things to do in the pool; reviewed and educated on how to elicit BM by sitting on toilet and breathing - get up if nothing happening and dont' force it      Lumbar Exercises: Stretches   Press Ups 10 reps;5 seconds   with overpressure, more centralized and less pain     Lumbar Exercises: Standing   Other Standing Lumbar Exercises lateral shift      Manual Therapy   Myofascial Release abdominal fascial release                      PT Short Term Goals - 12/17/20 1114       PT SHORT TERM GOAL #1   Title able to bulge pelvic floor    Status Achieved      PT SHORT TERM GOAL #2   Title pt will report at least 20% less straining    Baseline had BM every other day this week without straining    Status Achieved               PT Long Term Goals - 12/17/20 1114       PT LONG TERM GOAL #1   Title Pt will report BM at least every 3 days without straining or bearing down    Baseline every other day but still not sure if consistent    Status Partially Met      PT LONG TERM GOAL #2   Title Pt will improve lumbar soft tissue mobility for improved low back pain    Baseline working on centralizing pain      PT LONG TERM GOAL #3   Title Pt will imporve pelvic floor strength to at least 3/5 and holding for 10 seconds due to improved coordination without breath holding    Status On-going      PT LONG TERM GOAL #4   Title Pt will be ind with HEP to maintain improvements and functional level    Status On-going                   Plan - 12/17/20 1106     Clinical Impression Statement Pt is still having back pain today but she  was able to centralize it with lateral glides against the wall and then even more with prone press ups. Pt was educated and reviewed techniques to stimulate having a BM without straining since she had one every other day recently and  today was feeling like there is something that needed to come out. Pt is recommended to continue skilled PT to continue to work towards long term goals and work on HEP that does not flare up her low back pain.  She is also going to see Dr. Georgina Snell for the pain in her low back.    PT Treatment/Interventions ADLs/Self Care Home Management;Biofeedback;Cryotherapy;Software engineer;Therapeutic activities;Therapeutic exercise;Neuromuscular re-education;Patient/family education;Manual techniques;Taping;Dry needling;Passive range of motion    PT Next Visit Plan f/u on visit with Dr. Georgina Snell for low back; re-eval and goals    PT Home Exercise Plan Access Code: [email or web address]    Consulted and Agree with Plan of Care Patient             Patient will benefit from skilled therapeutic intervention in order to improve the following deficits and impairments:  Increased muscle spasms, Pain, Postural dysfunction, Decreased endurance, Decreased coordination, Decreased strength  Visit Diagnosis: Cramp and spasm  Chronic low back pain, unspecified back pain laterality, unspecified whether sciatica present  Muscle weakness (generalized)  Unspecified lack of coordination     Problem List Patient Active Problem List   Diagnosis Date Noted   Gastric bypass status for obesity 08/11/2020   Chronic idiopathic constipation 07/15/2020   Severe chronic obstructive pulmonary disease ([place]) 05/26/2020   Preoperative cardiovascular examination 05/13/2020   Mild CAD 05/13/2020   Prediabetes 05/13/2020   Depression    Anal pain 04/12/2020   Dysphonia 04/12/2020   Epigastric pain 04/12/2020   Family history of malignant neoplasm of gastrointestinal tract 04/12/2020   Left lower quadrant pain 04/12/2020   Pruritus ani 04/12/2020   Pure hypercholesterolemia 04/12/2020   Thoracic and lumbosacral neuritis 04/12/2020   Vocal fold nodules    Thyroid disease    Hypertension    GERD  (gastroesophageal reflux disease)    Fibroid, uterine    Depressive disorder    Colon polyps    Chronic rhinosinusitis    Anxiety    Hemorrhoids 02/22/2020   Statin intolerance 12/28/2019   Elevated CK 11/12/2019   Lymphadenopathy 11/12/2019   Neck pain 11/04/2019   Cervical radiculopathy 05/19/2019   Numbness and tingling 04/17/2019   Tachycardia 38/33/3832   Umbilical hernia 91/91/6606   Sun-damaged skin 01/19/2019   Preventative health care 01/19/2019   Right arm pain 01/16/2019   Numbness 10/11/2018   Carpal tunnel syndrome of right wrist 09/04/2018   Entrapment of right ulnar nerve 09/04/2018   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/26/2018   Bilateral hand pain 01/30/2018   Mucoid cyst, joint 01/30/2018   Nodule of finger of both hands 01/10/2018   Weakness 07/03/2017   Shortness of breath on exertion 07/03/2017   Osteoarthritis of spine with radiculopathy, cervical region 07/24/2016   Dysuria 05/25/2016   Epistaxis 05/25/2016   Hypersomnia with sleep apnea 02/18/2016   Severe obesity (BMI >= 40) ([place]) 02/18/2016   Thyroid activity decreased 07/30/2015   Left shoulder pain 06/27/2015   Pain in joint, shoulder region 06/27/2015   Myalgia 03/27/2015   Muscle cramp 05/22/2014   Mass of soft tissue of neck 02/23/2014   AP (abdominal pain) 02/15/2014   Raynaud disease 06/16/2013   Hoarseness of voice 05/11/2013   Obesity, unspecified 05/11/2013  Hyperglycemia 04/13/2013   Backache 03/03/2013   Low back pain 03/03/2013   Hypokalemia 02/05/2013   Edema 01/06/2013   Diabetes mellitus type 2 in obese ([place]) 01/06/2013   Chronic pain syndrome 01/06/2013   Cough 01/06/2013   Diabetes ([place]) 01/06/2013   Perimenopause 10/05/2012   Glaucoma    SOB (shortness of breath) 09/10/2011   Slow transit constipation 02/13/2011   Vitamin D deficiency 02/13/2011   IBS (irritable bowel syndrome) 02/13/2011   Paraesthesia of skin 08/31/2010   RHINITIS, CHRONIC 01/14/2010    Atypical chest pain 06/16/2008   THYROMEGALY 11/11/2007   Allergic rhinitis 11/11/2007   ECZEMA, HANDS 07/22/2007   Hypothyroidism 06/06/2007   Hyperlipemia, mixed 06/06/2007   ADJ DISORDER WITH MIXED ANXIETY & DEPRESSED MOOD 06/06/2007   OSA (obstructive sleep apnea) 06/06/2007   Benign essential hypertension 06/06/2007   COLONIC POLYPS, HX OF 06/06/2007    Jule Ser, PT 12/17/2020, 11:27 AM  [facility] [address], [place], [place], [address] Phone: [phone]   Fax:  [phone]  Name: [name] MRN: [id number] Date of Birth: [birth year]

## 2020-12-18 ENCOUNTER — Ambulatory Visit (INDEPENDENT_AMBULATORY_CARE_PROVIDER_SITE_OTHER): Payer: 59

## 2020-12-18 DIAGNOSIS — R29898 Other symptoms and signs involving the musculoskeletal system: Secondary | ICD-10-CM

## 2020-12-18 DIAGNOSIS — M5442 Lumbago with sciatica, left side: Secondary | ICD-10-CM | POA: Diagnosis not present

## 2020-12-18 DIAGNOSIS — G8929 Other chronic pain: Secondary | ICD-10-CM

## 2020-12-18 DIAGNOSIS — M545 Low back pain, unspecified: Secondary | ICD-10-CM | POA: Diagnosis not present

## 2020-12-20 ENCOUNTER — Encounter: Payer: Self-pay | Admitting: Family Medicine

## 2020-12-20 ENCOUNTER — Telehealth: Payer: Self-pay | Admitting: Family Medicine

## 2020-12-20 DIAGNOSIS — R29898 Other symptoms and signs involving the musculoskeletal system: Secondary | ICD-10-CM

## 2020-12-20 DIAGNOSIS — M5442 Lumbago with sciatica, left side: Secondary | ICD-10-CM

## 2020-12-20 DIAGNOSIS — M797 Fibromyalgia: Secondary | ICD-10-CM | POA: Insufficient documentation

## 2020-12-20 NOTE — Progress Notes (Signed)
MRI lumbar spine shows worsening bulging disc at L4-5 on the left.  Additionally have a little bit of potential narrowing at T12-L1 and L1-L2.  The L4-5 and possibly the T12-L1 and L1-L2 narrowing explain your symptoms.  I have already ordered an epidural steroid injection.  Please call [facility] at [phone] to schedule.  Okay to follow-up with me in the near future to go over the results of full detail

## 2020-12-20 NOTE — Telephone Encounter (Signed)
Epidural steroid injection ordered 

## 2020-12-21 ENCOUNTER — Ambulatory Visit (INDEPENDENT_AMBULATORY_CARE_PROVIDER_SITE_OTHER): Payer: 59 | Admitting: Professional

## 2020-12-21 DIAGNOSIS — F411 Generalized anxiety disorder: Secondary | ICD-10-CM

## 2020-12-22 ENCOUNTER — Encounter: Payer: Self-pay | Admitting: Family Medicine

## 2020-12-22 ENCOUNTER — Ambulatory Visit
Admission: RE | Admit: 2020-12-22 | Discharge: 2020-12-22 | Disposition: A | Discharge status: Home / Self Care | Payer: 59 | Source: Ambulatory Visit | Attending: Family Medicine | Admitting: Family Medicine

## 2020-12-22 ENCOUNTER — Inpatient Hospital Stay: Admission: RE | Admit: 2020-12-22 | Payer: 59 | Source: Ambulatory Visit

## 2020-12-22 ENCOUNTER — Other Ambulatory Visit (HOSPITAL_COMMUNITY): Payer: Self-pay

## 2020-12-22 ENCOUNTER — Other Ambulatory Visit: Payer: Self-pay

## 2020-12-22 DIAGNOSIS — M5442 Lumbago with sciatica, left side: Secondary | ICD-10-CM

## 2020-12-22 DIAGNOSIS — M5116 Intervertebral disc disorders with radiculopathy, lumbar region: Secondary | ICD-10-CM | POA: Diagnosis not present

## 2020-12-22 DIAGNOSIS — R29898 Other symptoms and signs involving the musculoskeletal system: Secondary | ICD-10-CM

## 2020-12-22 MED ORDER — IOPAMIDOL (ISOVUE-M 200) INJECTION 41%
1.0000 mL | Freq: Once | INTRAMUSCULAR | Status: AC
  Administered 2020-12-22: 1 mL via EPIDURAL

## 2020-12-22 MED ORDER — GABAPENTIN 100 MG PO CAPS
100.0000 mg | ORAL_CAPSULE | Freq: Three times a day (TID) | ORAL | 3 refills | Status: DC | PRN
  Filled 2020-12-22: qty 90, 10d supply, fill #0
  Filled 2021-03-07: qty 90, 10d supply, fill #1
  Filled 2021-04-22: qty 90, 10d supply, fill #2
  Filled 2021-05-31: qty 90, 10d supply, fill #3

## 2020-12-22 MED ORDER — METHYLPREDNISOLONE ACETATE 40 MG/ML INJ SUSP (RADIOLOG
80.0000 mg | Freq: Once | INTRAMUSCULAR | Status: AC
  Administered 2020-12-22: 80 mg via EPIDURAL

## 2020-12-22 NOTE — Discharge Instructions (Signed)

## 2020-12-23 ENCOUNTER — Other Ambulatory Visit: Payer: 59

## 2020-12-24 ENCOUNTER — Other Ambulatory Visit (HOSPITAL_COMMUNITY): Payer: Self-pay

## 2020-12-24 DIAGNOSIS — H40013 Open angle with borderline findings, low risk, bilateral: Secondary | ICD-10-CM | POA: Diagnosis not present

## 2020-12-25 ENCOUNTER — Other Ambulatory Visit: Payer: Self-pay | Admitting: Family Medicine

## 2020-12-28 ENCOUNTER — Encounter: Payer: Self-pay | Admitting: Family Medicine

## 2020-12-28 ENCOUNTER — Other Ambulatory Visit: Payer: Self-pay | Admitting: Family Medicine

## 2020-12-28 ENCOUNTER — Telehealth (INDEPENDENT_AMBULATORY_CARE_PROVIDER_SITE_OTHER): Payer: 59 | Admitting: Family Medicine

## 2020-12-28 ENCOUNTER — Other Ambulatory Visit (HOSPITAL_COMMUNITY): Payer: Self-pay

## 2020-12-28 ENCOUNTER — Other Ambulatory Visit: Payer: Self-pay

## 2020-12-28 VITALS — BP 123/83 | HR 90 | Temp 95.0°F | Wt 192.0 lb

## 2020-12-28 DIAGNOSIS — E782 Mixed hyperlipidemia: Secondary | ICD-10-CM | POA: Diagnosis not present

## 2020-12-28 DIAGNOSIS — E039 Hypothyroidism, unspecified: Secondary | ICD-10-CM | POA: Diagnosis not present

## 2020-12-28 DIAGNOSIS — I7300 Raynaud's syndrome without gangrene: Secondary | ICD-10-CM | POA: Diagnosis not present

## 2020-12-28 DIAGNOSIS — M5441 Lumbago with sciatica, right side: Secondary | ICD-10-CM

## 2020-12-28 DIAGNOSIS — K5901 Slow transit constipation: Secondary | ICD-10-CM | POA: Diagnosis not present

## 2020-12-28 DIAGNOSIS — U071 COVID-19: Secondary | ICD-10-CM

## 2020-12-28 DIAGNOSIS — R748 Abnormal levels of other serum enzymes: Secondary | ICD-10-CM

## 2020-12-28 DIAGNOSIS — G4733 Obstructive sleep apnea (adult) (pediatric): Secondary | ICD-10-CM

## 2020-12-28 DIAGNOSIS — E1169 Type 2 diabetes mellitus with other specified complication: Secondary | ICD-10-CM | POA: Diagnosis not present

## 2020-12-28 DIAGNOSIS — I10 Essential (primary) hypertension: Secondary | ICD-10-CM

## 2020-12-28 DIAGNOSIS — E559 Vitamin D deficiency, unspecified: Secondary | ICD-10-CM | POA: Diagnosis not present

## 2020-12-28 DIAGNOSIS — M791 Myalgia, unspecified site: Secondary | ICD-10-CM | POA: Diagnosis not present

## 2020-12-28 DIAGNOSIS — E669 Obesity, unspecified: Secondary | ICD-10-CM

## 2020-12-28 MED ORDER — MODAFINIL 200 MG PO TABS
400.0000 mg | ORAL_TABLET | Freq: Every day | ORAL | 5 refills | Status: DC
  Filled 2020-12-28: qty 60, 30d supply, fill #0
  Filled 2021-06-03: qty 60, 30d supply, fill #1

## 2020-12-28 MED ORDER — MODAFINIL 200 MG PO TABS
400.0000 mg | ORAL_TABLET | Freq: Every day | ORAL | 5 refills | Status: DC
  Filled 2020-12-28: qty 30, 15d supply, fill #0

## 2020-12-28 NOTE — Progress Notes (Signed)
MyChart Video Visit    Virtual Visit via Video Note   This visit type was conducted due to national recommendations for restrictions regarding the COVID-19 Pandemic (e.g. social distancing) in an effort to limit this patient's exposure and mitigate transmission in our community. This patient is at least at moderate risk for complications without adequate follow up. This format is felt to be most appropriate for this patient at this time. Physical exam was limited by quality of the video and audio technology used for the visit. S. Chism, CMA was able to get the patient set up on a video visit.  Patient location: Home Patient and provider in visit Provider location: Office  I discussed the limitations of evaluation and management by telemedicine and the availability of in person appointments. The patient expressed understanding and agreed to proceed.  Visit Date: 12/29/20  Today's healthcare provider: Penni Homans, MD     Subjective:    Patient ID: [name], female    DOB: 08-09-1960, 60 y.o.   MRN: [id number]  Chief Complaint  Patient presents with   Follow-up    HPI Patient is in today for video visit for follow up on lower back pain and hypertension. She reports that after her weight loss surgery she was able to get down to 193 lbs. She has had covid and only recovered around 4 weeks ago. She is still struggling with lower back pain and constipation with straining. Denies CP/palp/SOB/HA/congestion/fevers/GI or GU c/o. Taking meds as prescribed.   She received epidural steroid injections last Wednesday by Dr. Georgina Snell which she reports has improved her back pain slightly. For her constipation she takes Amitiza BID and a suppository. She reports that she has bowel movements once every 3 days.  She switched from thyroid armor to NP thyroid due to insurance issues. She has excess scar tissue from her hysterectomy. Since most of her discomfort is due to her weak core strength,  she is thinking about starting water therapy. Notices tachycardia when experiencing work stress.  Past Medical History:  Diagnosis Date   Acute bronchitis 05/25/2016   Anxiety    Arthritis    Chronic pain syndrome 01/06/2013   Chronic rhinosinusitis    Colon polyps    Cough 01/06/2013   Depression    Diabetes ([place]) 01/06/2013   Diabetes mellitus type 2 in obese [facility]) 01/06/2013   Sees Dr Calvert Cantor for eye exam Does not see podiatry, foot exam today unremarkable except for thick cracking skin on heals  pt denies    Dyspnea    with exertion    Dysuria 05/25/2016   Edema 01/06/2013   Epistaxis 05/25/2016   Fibroid, uterine    Fibromyalgia    GERD (gastroesophageal reflux disease)    Glaucoma 12-13   Hoarseness    Hoarseness of voice 05/11/2013   Hyperglycemia 04/13/2013   Hyperlipemia, mixed 06/06/2007   Qualifier: Diagnosis of  By: Wynona Luna She feels Lipitor caused increased low back pain and weakness     Hyperlipidemia    Hypertension    Hypokalemia 02/05/2013   IBS (irritable bowel syndrome) 02/13/2011   Low back pain 03/03/2013   Muscle cramp 05/22/2014   Obesity    Obesity, unspecified 05/11/2013   OSA (obstructive sleep apnea)    cpap    Pain in joint, shoulder region 06/27/2015   Perimenopause 10/05/2012   Pre-diabetes    Raynaud disease 06/16/2013   Thyroid disease    hypothyroidism   Vocal  fold nodules     Past Surgical History:  Procedure Laterality Date   ABDOMINAL HYSTERECTOMY     BREAST EXCISIONAL BIOPSY Right    at age 48   CHOLECYSTECTOMY     colonoscopoy      [place]     x2   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   Procedure: LAPAROSCOPIC ROUX-EN-Y GASTRIC BYPASS WITH UPPER ENDOSCOPY;  Surgeon: [name], MD;  Location: [facility];  Service: General;  Laterality: N/A;   I & D EXTREMITY Left 04/11/2018   Procedure: DEBIDMENT DISTAL INTERPHALANGEAL LEFT MIDDLE;  Surgeon: Daryll Brod, MD;  Location: [facility];  Service: Orthopedics;  Laterality: Left;   INCISIONAL HERNIA REPAIR  08/10/2020   Procedure: LAPAROSCOPIC PRIMARY REPAIR OF INCISIONAL HERNIA;  Surgeon: [name], MD;  Location: [facility];  Service: General;;   MASS EXCISION Left 04/11/2018   Procedure: EXCISION MASS;  Surgeon: Daryll Brod, MD;  Location: [facility];  Service: Orthopedics;  Laterality: Left;   OOPHORECTOMY     POLYPECTOMY     ROTATOR CUFF REPAIR     UPPER GI ENDOSCOPY N/A 08/10/2020   Procedure: UPPER GI ENDOSCOPY;  Surgeon: [name], MD;  Location: [facility];  Service: General;  Laterality: N/A;    Family History  Problem Relation Age of Onset   Alcohol abuse Father    Cancer Father        renal and colon   Hyperlipidemia Father    Hypertension Father    Stroke Father    Heart disease Father    Cancer Paternal Grandfather        colon   Diabetes Other    High blood pressure Mother    High Cholesterol Mother    Kidney disease Mother    Depression Mother    Anxiety disorder Mother    Obesity Mother    Breast cancer Other 55   Diabetes Sister    Diabetes Brother     Social History   Socioeconomic History   Marital status: Significant Other    Spouse name: Not on file   Number of children: 0   Years of education: Not on file   Highest education level: Not on file  Occupational History   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  Tobacco Use   Smoking status: Former    Packs/day: 0.75    Years: 42.00    Pack years: 31.50    Types: Cigarettes    Quit date: 04/24/2005    Years since quitting: 15.6   Smokeless tobacco: Never   Tobacco comments:    smoked since age 41  Vaping Use   Vaping Use: Never used  Substance and Sexual Activity   Alcohol use: No   Drug use: Never   Sexual activity: Yes    Partners: Male  Other Topics Concern   Not on file  Social History Narrative   Endo-- Dr Dwyane Dee   ENT--Dr shoemaker   GI--Dr [place]   Pulm--Dr Clance   Rheum--Dr Charlestine Night          Social Determinants of Health   Financial Resource Strain: Not on file  Food Insecurity: Not on file  Transportation Needs: Not on file  Physical Activity: Not on file  Stress: Not on file  Social Connections: Not on file  Intimate Partner Violence: Not on file    Outpatient Medications Prior to Visit  Medication Sig Dispense Refill   amLODipine ([place])  5 MG tablet Take 1 tablet (5 mg total) by mouth daily. 30 tablet 2   busPIRone (BUSPAR) 7.5 MG tablet Take 1 tablet (7.5 mg total) by mouth 3 (three) times daily. 90 tablet 0   Calcium Carbonate 500 MG CHEW Chew 500 mg by mouth daily at 12 noon.     diphenhydrAMINE (BENADRYL) 25 MG tablet Take 25 mg by mouth every 6 (six) hours as needed.     Evolocumab 140 MG/ML SOAJ INJECT 1 DOSE INTO THE SKIN EVERY 14 (FOURTEEN) DAYS. (Patient taking differently: Inject 140 mg into the skin every 14 (fourteen) days.) 6 mL 3   furosemide (LASIX) 20 MG tablet Take 1 tablet (20 mg total) by mouth daily as needed. 90 tablet 1   gabapentin (NEURONTIN) 100 MG capsule Take 1-3 capsules (100-300 mg total) by mouth 3 (three) times daily as needed. 90 capsule 3   ipratropium (ATROVENT) 0.06 % nasal spray Place 2 sprays into the nose daily.     loratadine (CLARITIN) 10 MG tablet Take 20 mg by mouth daily.     lubiprostone (AMITIZA) 24 MCG capsule Take 1 capsule (24 mcg total) by mouth 2 (two) times daily with food and water 60 capsule 0   metoprolol tartrate (LOPRESSOR) 25 MG tablet Take 0.5 tablets (12.5 mg total) by mouth 2 (two) times daily. 90 tablet 0   Milnacipran (SAVELLA) 50 MG TABS tablet Take 1 tablet (50 mg total) by mouth 2 (two) times daily. 180 tablet 1   modafinil (PROVIGIL) 200 MG tablet Take 2 tablets (400 mg total) by mouth daily. 60 tablet 5   Multiple Vitamins-Minerals (MULTIVITAMIN WITH MINERALS) tablet Take 1 tablet by mouth daily.     pantoprazole (PROTONIX) 40 MG tablet Take 1 tablet (40 mg total) by mouth daily. 90 tablet 0    ranolazine (RANEXA) 500 MG 12 hr tablet Take 1 tablet (500 mg total) by mouth 2 (two) times daily. 180 tablet 2   spironolactone (ALDACTONE) 50 MG tablet Take 1 tablet (50 mg total) by mouth 3 (three) times daily. 270 tablet 3   thyroid (ARMOUR) 90 MG tablet Take 1 tablet (90 mg total) by mouth daily. 90 tablet 4   albuterol (VENTOLIN HFA) 108 (90 Base) MCG/ACT inhaler INHALE 2 PUFFS INTO THE LUNGS EVERY 4 (FOUR) HOURS AS NEEDED FOR WHEEZING OR SHORTNESS OF BREATH. 18 g 5   Vitamin D, Ergocalciferol, (DRISDOL) 1.25 MG (50000 UNIT) CAPS capsule Take 1 capsule (50,000 Units total) by mouth every 7 (seven) days. (Patient not taking: Reported on 12/28/2020) 12 capsule 1   modafinil (PROVIGIL) 200 MG tablet TAKE 2 TABLETS BY MOUTH DAILY 180 tablet 1   No facility-administered medications prior to visit.    Allergies  Allergen Reactions   Crestor [Rosuvastatin] Other (See Comments)    Myalgias and rising CPK   Losartan Cough   Wellbutrin [Bupropion] Other (See Comments)    Dizziness and mental status changes   Atorvastatin     myalgias   Amoxicillin Rash   Aspirin Nausea Only and Other (See Comments)    GI upset REACTION: Upset stomach   Codeine Rash   Erythromycin Rash and Other (See Comments)   Penicillins Rash    Reaction: 15 years    Review of Systems  Constitutional:  Negative for chills, fever and malaise/fatigue.  HENT:  Negative for congestion, sinus pain and sore throat.   Eyes:  Negative for blurred vision.  Respiratory:  Negative for cough and shortness of breath.  Cardiovascular:  Negative for chest pain, palpitations and leg swelling.  Gastrointestinal:  Positive for constipation. Negative for blood in stool, diarrhea, nausea and vomiting.  Genitourinary:  Negative for flank pain and frequency.  Musculoskeletal:  Positive for back pain (lower).  Skin:  Negative for rash.  Neurological:  Negative for headaches.      Objective:    Physical Exam Constitutional:       Appearance: Normal appearance.  HENT:     Head: Normocephalic and atraumatic.     Right Ear: External ear normal.     Left Ear: External ear normal.  Pulmonary:     Effort: Pulmonary effort is normal.  Musculoskeletal:        General: Normal range of motion.     Cervical back: Normal range of motion.  Skin:    General: Skin is dry.  Neurological:     Mental Status: She is alert and oriented to person, place, and time.  Psychiatric:        Behavior: Behavior normal.    BP 123/83   Pulse 90   Temp (!) 95 F (35 C)   Wt 192 lb (87.1 kg)   BMI 32.45 kg/m  Wt Readings from Last 3 Encounters:  12/28/20 192 lb (87.1 kg)  12/14/20 195 lb 6.4 oz (88.6 kg)  10/22/20 210 lb (95.3 kg)    Diabetic Foot Exam - Simple   No data filed    Lab Results  Component Value Date   WBC 4.5 09/09/2020   HGB 13.6 09/09/2020   HCT 39.8 09/09/2020   PLT 308.0 09/09/2020   GLUCOSE 85 09/09/2020   CHOL 77 (L) 10/11/2020   TRIG 80 10/11/2020   HDL 43 10/11/2020   LDLDIRECT 159.0 11/11/2019   LDLCALC 17 10/11/2020   ALT 47 (H) 09/09/2020   AST 37 09/09/2020   NA 141 09/09/2020   K 3.9 09/09/2020   CL 104 09/09/2020   CREATININE 0.79 09/09/2020   BUN 13 09/09/2020   CO2 27 09/09/2020   TSH 1.02 02/13/2020   HGBA1C 5.9 (H) 08/03/2020   MICROALBUR <0.7 08/19/2019    Lab Results  Component Value Date   TSH 1.02 02/13/2020   Lab Results  Component Value Date   WBC 4.5 09/09/2020   HGB 13.6 09/09/2020   HCT 39.8 09/09/2020   MCV 90.6 09/09/2020   PLT 308.0 09/09/2020   Lab Results  Component Value Date   NA 141 09/09/2020   K 3.9 09/09/2020   CO2 27 09/09/2020   GLUCOSE 85 09/09/2020   BUN 13 09/09/2020   CREATININE 0.79 09/09/2020   BILITOT 0.4 09/09/2020   ALKPHOS 75 09/09/2020   AST 37 09/09/2020   ALT 47 (H) 09/09/2020   PROT 7.0 09/09/2020   ALBUMIN 4.6 09/09/2020   CALCIUM 9.9 09/09/2020   ANIONGAP 7 08/16/2020   EGFR 58 (L) 06/22/2020   GFR 81.67 09/09/2020    Lab Results  Component Value Date   CHOL 77 (L) 10/11/2020   Lab Results  Component Value Date   HDL 43 10/11/2020   Lab Results  Component Value Date   LDLCALC 17 10/11/2020   Lab Results  Component Value Date   TRIG 80 10/11/2020   Lab Results  Component Value Date   CHOLHDL 1.8 10/11/2020   Lab Results  Component Value Date   HGBA1C 5.9 (H) 08/03/2020       Assessment & Plan:   Problem List Items Addressed This Visit  Hypothyroidism - Primary   Relevant Orders   Comprehensive metabolic panel   Hyperlipemia, mixed   Relevant Orders   Lipid panel   OSA (obstructive sleep apnea)   Raynaud disease    Has increased symptoms are occurring more since her gastric bypass. With increased pain and cyanosis. Referred to vascular surgery for evaluation. She has been started on Amlodipine 5 mg daily but has not noted significant improvement      Relevant Orders   Ambulatory referral to Vascular Surgery   Slow transit constipation    Encouraged increased hydration and fiber in diet. Daily probiotics. If bowels not moving can use MOM 2 tbls po in 4 oz of warm prune juice by mouth every 2-3 days. If no results then repeat in 4 hours with  Dulcolax suppository pr, may repeat again in 4 more hours as needed. Seek care if symptoms worsen. Consider daily Miralax and/or Dulcolax if symptoms persist. She had a hysterectomy and significant scar tissue was noted. She will need consultation with GI if no improvement.       Vitamin D deficiency   Diabetes mellitus type 2 in obese (HCC)    hgba1c acceptable, minimize simple carbs. Increase exercise as tolerated. Continue current meds      Relevant Orders   Hemoglobin A1c   Backache    Has had an injection and that has helped her pain some      Myalgia   Relevant Orders   CBC   CK (Creatine Kinase)   Elevated CK    Repeat labs      Hypertension    Well controlled, no changes to meds. Encouraged heart healthy diet such  as the DASH diet and exercise as tolerated.       COVID-19    Had an infection in July but feels she has fully recovered.       RESOLVED: Benign essential hypertension    Well controlled, no changes to meds. Encouraged heart healthy diet such as the DASH diet and exercise as tolerated.          No orders of the defined types were placed in this encounter.   I discussed the assessment and treatment plan with the patient. The patient was provided an opportunity to ask questions and all were answered. The patient agreed with the plan and demonstrated an understanding of the instructions.   The patient was advised to call back or seek an in-person evaluation if the symptoms worsen or if the condition fails to improve as anticipated.  I provided 33 minutes of face-to-face time during this encounter.   Penni Homans, MD [facility] [phone] (phone) [phone] (fax)  [place], Suezanne Jacquet, acting as a scribe for Penni Homans, MD, have documented all relevent documentation on behalf of Penni Homans, MD, as directed by Penni Homans, MD while in the presence of Penni Homans, MD. DO:12/29/20.  I, Mosie Lukes, MD personally performed the services described in this documentation. All medical record entries made by the scribe were at my direction and in my presence. I have reviewed the chart and agree that the record reflects my personal performance and is accurate and complete

## 2020-12-28 NOTE — Telephone Encounter (Signed)
Requesting: modafinil '200mg'$  Contract: None [id number] Last Visit: 09/09/2020 Next Visit: 12/28/2020 (today) Last Refill: 05/18/2020 #180, 1 RF (expired)  Please Advise

## 2020-12-28 NOTE — Assessment & Plan Note (Addendum)
Has increased symptoms are occurring more since her gastric bypass. With increased pain and cyanosis. Referred to vascular surgery for evaluation. She has been started on Amlodipine 5 mg daily but has not noted significant improvement

## 2020-12-28 NOTE — Patient Instructions (Signed)
Encouraged increased hydration and fiber in diet. Daily probiotics. If bowels not moving can use MOM 2 tbls po in 4 oz of warm prune juice by mouth every 2-3 days. If no results then repeat in 4 hours with  Dulcolax suppository pr, may repeat again in 4 more hours as needed. Seek care if symptoms worsen. Consider daily Miralax and/or Dulcolax if symptoms persist.  

## 2020-12-29 DIAGNOSIS — U071 COVID-19: Secondary | ICD-10-CM | POA: Insufficient documentation

## 2020-12-29 NOTE — Assessment & Plan Note (Signed)
Had an infection in July but feels she has fully recovered.

## 2020-12-29 NOTE — Assessment & Plan Note (Addendum)
Encouraged increased hydration and fiber in diet. Daily probiotics. If bowels not moving can use MOM 2 tbls po in 4 oz of warm prune juice by mouth every 2-3 days. If no results then repeat in 4 hours with  Dulcolax suppository pr, may repeat again in 4 more hours as needed. Seek care if symptoms worsen. Consider daily Miralax and/or Dulcolax if symptoms persist. She had a hysterectomy and significant scar tissue was noted. She will need consultation with GI if no improvement.

## 2020-12-29 NOTE — Assessment & Plan Note (Signed)
hgba1c acceptable, minimize simple carbs. Increase exercise as tolerated. Continue current meds 

## 2020-12-29 NOTE — Assessment & Plan Note (Signed)
Well controlled, no changes to meds. Encouraged heart healthy diet such as the DASH diet and exercise as tolerated.  °

## 2020-12-29 NOTE — Assessment & Plan Note (Signed)
Has had an injection and that has helped her pain some

## 2020-12-29 NOTE — Assessment & Plan Note (Signed)
Repeat labs:

## 2020-12-30 ENCOUNTER — Other Ambulatory Visit: Payer: Self-pay

## 2020-12-30 ENCOUNTER — Other Ambulatory Visit (HOSPITAL_COMMUNITY): Payer: Self-pay

## 2020-12-30 ENCOUNTER — Encounter: Payer: Self-pay | Admitting: Cardiology

## 2020-12-30 ENCOUNTER — Other Ambulatory Visit (HOSPITAL_COMMUNITY): Payer: Self-pay | Admitting: Psychiatry

## 2020-12-30 ENCOUNTER — Ambulatory Visit: Payer: 59 | Admitting: Cardiology

## 2020-12-30 VITALS — Ht 64.5 in

## 2020-12-30 DIAGNOSIS — E7801 Familial hypercholesterolemia: Secondary | ICD-10-CM | POA: Diagnosis not present

## 2020-12-30 DIAGNOSIS — I10 Essential (primary) hypertension: Secondary | ICD-10-CM

## 2020-12-30 DIAGNOSIS — R7303 Prediabetes: Secondary | ICD-10-CM

## 2020-12-30 DIAGNOSIS — I2510 Atherosclerotic heart disease of native coronary artery without angina pectoris: Secondary | ICD-10-CM

## 2020-12-30 MED ORDER — BUSPIRONE HCL 7.5 MG PO TABS
7.5000 mg | ORAL_TABLET | Freq: Three times a day (TID) | ORAL | 0 refills | Status: DC
  Filled 2020-12-30: qty 90, 30d supply, fill #0

## 2020-12-30 NOTE — Patient Instructions (Addendum)
Medication Instructions:  Your physician has recommended you make the following change in your medication:  Stop Ranexa  *If you need a refill on your cardiac medications before your next appointment, please call your pharmacy*   Lab Work: None  If you have labs (blood work) drawn today and your tests are completely normal, you will receive your results only by: [place] (if you have MyChart) OR A paper copy in the mail If you have any lab test that is abnormal or we need to change your treatment, we will call you to review the results.   Testing/Procedures: None   Follow-Up: 1 year   At [facility], you and your health needs are our priority.  As part of our continuing mission to provide you with exceptional heart care, we have created designated Provider Care Teams.  These Care Teams include your primary Cardiologist (physician) and Advanced Practice Providers (APPs -  Physician Assistants and Nurse Practitioners) who all work together to provide you with the care you need, when you need it.  We recommend signing up for the patient portal called "MyChart".  Sign up information is provided on this After Visit Summary.  MyChart is used to connect with patients for Virtual Visits (Telemedicine).  Patients are able to view lab/test results, encounter notes, upcoming appointments, etc.  Non-urgent messages can be sent to your provider as well.   To learn more about what you can do with MyChart, go to [email or web address].    Your next appointment:   1 year(s)  The format for your next appointment:   In Person  Provider:      Other Instructions None

## 2020-12-30 NOTE — Progress Notes (Signed)
Cardiology Office Note:    Date:  12/31/2020   ID:  [name], DOB [birth year], MRN [id number]  PCP:  Gina Lukes, MD  Cardiologist:  Gina Salines, DO  Electrophysiologist:  None   Referring MD: Gina Lukes, MD   " I feel great"  History of Present Illness:    [name] is a 60 y.o. female with a hx of hypertension, prediabetes, hyperlipidemia with statin intolerance mild coronary artery disease seen on coronary CTA is here today for follow-up visit.  I did see the patient back in March 2022 at that time she appeared to be doing well from a cardiovascular standpoint.  We had continued her Ranexa at 500 mg twice a day.  She has started her PCSK9 inhibitors and this was doing well.  She had planned bariatric surgery for April 19,2022.  She is here today for follow-up visit.   She has recovered well from a bariatric surgery.  She is happy with the decision.  She has lost weight and currently is close to 190 lbs.  She tells me that she is no longer experiencing any chest discomfort.  She is taking her Inderal for the palpitation has not had many episodes.  Past Medical History:  Diagnosis Date   Acute bronchitis 05/25/2016   Anxiety    Arthritis    Chronic pain syndrome 01/06/2013   Chronic rhinosinusitis    Colon polyps    Cough 01/06/2013   Depression    Diabetes ([place]) 01/06/2013   Diabetes mellitus type 2 in obese [facility]) 01/06/2013   Sees Dr [name] for eye exam Does not see podiatry, foot exam today unremarkable except for thick cracking skin on heals  pt denies    Dyspnea    with exertion    Dysuria 05/25/2016   Edema 01/06/2013   Epistaxis 05/25/2016   Fibroid, uterine    Fibromyalgia    GERD (gastroesophageal reflux disease)    Glaucoma 12-13   Hoarseness    Hoarseness of voice 05/11/2013   Hyperglycemia 04/13/2013   Hyperlipemia, mixed 06/06/2007   Qualifier: Diagnosis of  By: Wynona Luna She feels Lipitor caused increased low back pain and weakness      Hyperlipidemia    Hypertension    Hypokalemia 02/05/2013   IBS (irritable bowel syndrome) 02/13/2011   Low back pain 03/03/2013   Muscle cramp 05/22/2014   Obesity    Obesity, unspecified 05/11/2013   OSA (obstructive sleep apnea)    cpap    Pain in joint, shoulder region 06/27/2015   Perimenopause 10/05/2012   Pre-diabetes    Raynaud disease 06/16/2013   Thyroid disease    hypothyroidism   Vocal fold nodules     Past Surgical History:  Procedure Laterality Date   ABDOMINAL HYSTERECTOMY     BREAST EXCISIONAL BIOPSY Right    at age 37   CHOLECYSTECTOMY     colonoscopoy      [place]     x2   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   Procedure: LAPAROSCOPIC ROUX-EN-Y GASTRIC BYPASS WITH UPPER ENDOSCOPY;  Surgeon: [name], MD;  Location: [facility];  Service: General;  Laterality: N/A;   I & D EXTREMITY Left 04/11/2018   Procedure: DEBIDMENT DISTAL INTERPHALANGEAL LEFT MIDDLE;  Surgeon: Daryll Brod, MD;  Location: [facility];  Service: Orthopedics;  Laterality: Left;   INCISIONAL HERNIA REPAIR  08/10/2020   Procedure: LAPAROSCOPIC PRIMARY REPAIR  OF INCISIONAL HERNIA;  Surgeon: [name], MD;  Location: [facility];  Service: General;;   MASS EXCISION Left 04/11/2018   Procedure: EXCISION MASS;  Surgeon: Daryll Brod, MD;  Location: [place];  Service: Orthopedics;  Laterality: Left;   OOPHORECTOMY     POLYPECTOMY     ROTATOR CUFF REPAIR     UPPER GI ENDOSCOPY N/A 08/10/2020   Procedure: UPPER GI ENDOSCOPY;  Surgeon: [name], MD;  Location: [facility];  Service: General;  Laterality: N/A;    Current Medications: Current Meds  Medication Sig   albuterol (VENTOLIN HFA) 108 (90 Base) MCG/ACT inhaler INHALE 2 PUFFS INTO THE LUNGS EVERY 4 (FOUR) HOURS AS NEEDED FOR WHEEZING OR SHORTNESS OF BREATH.   amLODipine (NORVASC) 5 MG tablet Take 1 tablet (5 mg total) by mouth daily.   busPIRone (BUSPAR) 7.5 MG tablet Take 1  tablet (7.5 mg total) by mouth 3 (three) times daily.   Calcium Carbonate 500 MG CHEW Chew 500 mg by mouth daily at 12 noon.   diphenhydrAMINE (BENADRYL) 25 MG tablet Take 25 mg by mouth every 6 (six) hours as needed for allergies or itching.   Evolocumab 140 MG/ML SOAJ INJECT 1 DOSE INTO THE SKIN EVERY 14 (FOURTEEN) DAYS. (Patient taking differently: Inject 140 mg into the skin every 14 (fourteen) days.)   furosemide (LASIX) 20 MG tablet Take 1 tablet (20 mg total) by mouth daily as needed.   gabapentin (NEURONTIN) 100 MG capsule Take 1-3 capsules (100-300 mg total) by mouth 3 (three) times daily as needed.   ipratropium (ATROVENT) 0.06 % nasal spray Place 2 sprays into the nose daily.   loratadine (CLARITIN) 10 MG tablet Take 20 mg by mouth daily.   lubiprostone (AMITIZA) 24 MCG capsule Take 1 capsule (24 mcg total) by mouth 2 (two) times daily with food and water   metoprolol tartrate (LOPRESSOR) 25 MG tablet Take 0.5 tablets (12.5 mg total) by mouth 2 (two) times daily.   Milnacipran (SAVELLA) 50 MG TABS tablet Take 1 tablet (50 mg total) by mouth 2 (two) times daily.   modafinil (PROVIGIL) 200 MG tablet Take 2 tablets (400 mg total) by mouth daily.   Multiple Vitamins-Minerals (MULTIVITAMIN WITH MINERALS) tablet Take 1 tablet by mouth daily.   pantoprazole (PROTONIX) 40 MG tablet Take 1 tablet (40 mg total) by mouth daily.   spironolactone (ALDACTONE) 50 MG tablet Take 1 tablet (50 mg total) by mouth 3 (three) times daily.   thyroid (NP THYROID) 90 MG tablet Take 90 mg by mouth daily.   Vitamin D, Ergocalciferol, (DRISDOL) 1.25 MG (50000 UNIT) CAPS capsule Take 1 capsule (50,000 Units total) by mouth every 7 (seven) days.   [DISCONTINUED] ranolazine (RANEXA) 500 MG 12 hr tablet Take 1 tablet (500 mg total) by mouth 2 (two) times daily.     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  Tobacco Use   Smoking status: Former    Packs/day: 0.75    Years: 42.00    Pack years: 31.50    Types: Cigarettes    Quit date: 04/24/2005    Years since quitting: 10.6  Smokeless tobacco: Never   Tobacco comments:    smoked since age 44  Vaping Use   Vaping Use: Never used  Substance and Sexual Activity   Alcohol use: No   Drug use: Never   Sexual activity: Yes    Partners: Male  Other Topics Concern   Not on file  Social History Narrative   Endo-- Dr Dwyane Dee   ENT--Dr shoemaker   GI--Dr Royal Pines   Pulm--Dr Clance   Rheum--Dr Charlestine Night         Social Determinants of Health   Financial Resource Strain: Not on file  Food Insecurity: Not on file  Transportation Needs: Not on file  Physical Activity: Not on file  Stress: Not on file  Social Connections: Not on file     Family History: The patient's family history includes Alcohol abuse in her father; Anxiety disorder in her mother; Breast cancer (age of onset: 37) in an other family member; Cancer in her father and paternal grandfather; Depression in her mother; Diabetes in her brother, sister, and another family member; Heart disease in her father; High Cholesterol in her mother; High blood pressure in her mother; Hyperlipidemia in her father; Hypertension in her father; Kidney disease in her mother; Obesity in her mother; Stroke in her father.  ROS:   Review of Systems  Constitution: Negative for decreased appetite, fever and weight gain.  HENT: Negative for congestion, ear discharge, hoarse voice and sore throat.   Eyes: Negative for discharge, redness, vision loss in right eye and visual halos.  Cardiovascular: Negative for chest pain, dyspnea  on exertion, leg swelling, orthopnea and palpitations.  Respiratory: Negative for cough, hemoptysis, shortness of breath and snoring.   Endocrine: Negative for heat intolerance and polyphagia.  Hematologic/Lymphatic: Negative for bleeding problem. Does not bruise/bleed easily.  Skin: Negative for flushing, nail changes, rash and suspicious lesions.  Musculoskeletal: Negative for arthritis, joint pain, muscle cramps, myalgias, neck pain and stiffness.  Gastrointestinal: Negative for abdominal pain, bowel incontinence, diarrhea and excessive appetite.  Genitourinary: Negative for decreased libido, genital sores and incomplete emptying.  Neurological: Negative for brief paralysis, focal weakness, headaches and loss of balance.  Psychiatric/Behavioral: Negative for altered mental status, depression and suicidal ideas.  Allergic/Immunologic: Negative for HIV exposure and persistent infections.    EKGs/Labs/Other Studies Reviewed:    The following studies were reviewed today:   EKG: None today  Recent Labs: 02/13/2020: TSH 1.02 09/09/2020: ALT 47; BUN 13; Creatinine, Ser 0.79; Hemoglobin 13.6; Magnesium 2.0; Platelets 308.0; Potassium 3.9; Sodium 141  Recent Lipid Panel    Component Value Date/Time   CHOL 77 (L) 10/11/2020 0816   TRIG 80 10/11/2020 0816   HDL 43 10/11/2020 0816   CHOLHDL 1.8 10/11/2020 0816   CHOLHDL 4.9 02/13/2020 0857   VLDL 43.2 (H) 11/11/2019 1125   LDLCALC 17 10/11/2020 0816   LDLCALC 138 (H) 02/13/2020 0857   LDLDIRECT 159.0 11/11/2019 1125    Physical Exam:    VS:  Ht 5' 4.5" (1.638 m)   BMI 32.45 kg/m     Wt Readings from Last 3 Encounters:  12/28/20 192 lb (87.1 kg)  12/14/20 195 lb 6.4 oz (88.6 kg)  10/22/20 210 lb (95.3 kg)     GEN: Well nourished, well developed in no acute distress HEENT: Normal NECK: No JVD; No carotid bruits LYMPHATICS: No lymphadenopathy CARDIAC: S1S2 noted,RRR, no murmurs, rubs, gallops RESPIRATORY:  Clear to  auscultation without rales, wheezing or rhonchi  ABDOMEN: Soft, non-tender, non-distended, +bowel sounds,  no guarding. EXTREMITIES: No edema, No cyanosis, no clubbing MUSCULOSKELETAL:  No deformity  SKIN: Warm and dry NEUROLOGIC:  Alert and oriented x 3, non-focal PSYCHIATRIC:  Normal affect, good insight  ASSESSMENT:    1. Familial hypercholesterolemia   2. Essential hypertension   3. Morbid obesity ([place])   4. Mild CAD   5. Prediabetes    PLAN:      From a cardiovascular standpoint she appears to be doing well.  She has stopped the Renel as he has not been experiencing any symptoms we will stop this medication in our medication list as well.  She has familial hyperlipidemia we will continue patient on her current PCSK9 inhibitor.  She follows with our lipid clinic. Blood pressure is acceptable, continue with current antihypertensive regimen. Hyperlipidemia - continue with current statin medication.  The patient is in agreement with the above plan. The patient left the office in stable condition.  The patient will follow up in   Medication Adjustments/Labs and Tests Ordered: Current medicines are reviewed at length with the patient today.  Concerns regarding medicines are outlined above.  No orders of the defined types were placed in this encounter.  No orders of the defined types were placed in this encounter.   Patient Instructions  Medication Instructions:  Your physician has recommended you make the following change in your medication:  Stop Ranexa  *If you need a refill on your cardiac medications before your next appointment, please call your pharmacy*   Lab Work: None  If you have labs (blood work) drawn today and your tests are completely normal, you will receive your results only by: [place] (if you have MyChart) OR A paper copy in the mail If you have any lab test that is abnormal or we need to change your treatment, we will call you to review the  results.   Testing/Procedures: None   Follow-Up: 1 year   At [facility], you and your health needs are our priority.  As part of our continuing mission to provide you with exceptional heart care, we have created designated Provider Care Teams.  These Care Teams include your primary Cardiologist (physician) and Advanced Practice Providers (APPs -  Physician Assistants and Nurse Practitioners) who all work together to provide you with the care you need, when you need it.  We recommend signing up for the patient portal called "MyChart".  Sign up information is provided on this After Visit Summary.  MyChart is used to connect with patients for Virtual Visits (Telemedicine).  Patients are able to view lab/test results, encounter notes, upcoming appointments, etc.  Non-urgent messages can be sent to your provider as well.   To learn more about what you can do with MyChart, go to [email or web address].    Your next appointment:   1 year(s)  The format for your next appointment:   In Person  Provider:      Other Instructions None   Adopting a Healthy Lifestyle.  Know what a healthy weight is for you (roughly BMI <25) and aim to maintain this   Aim for 7+ servings of fruits and vegetables daily   65-80+ fluid ounces of water or unsweet tea for healthy kidneys   Limit to max 1 drink of alcohol per day; avoid smoking/tobacco   Limit animal fats in diet for cholesterol and heart health - choose grass fed whenever available   Avoid highly processed foods, and foods high in saturated/trans fats   Aim for low stress -  take time to unwind and care for your mental health   Aim for 150 min of moderate intensity exercise weekly for heart health, and weights twice weekly for bone health   Aim for 7-9 hours of sleep daily   When it comes to diets, agreement about the perfect plan isnt easy to find, even among the experts. Experts at the [employer] developed an  idea known as the Healthy Eating Plate. Just imagine a plate divided into logical, healthy portions.   The emphasis is on diet quality:   Load up on vegetables and fruits - one-half of your plate: Aim for color and variety, and remember that potatoes dont count.   Go for whole grains - one-quarter of your plate: Whole wheat, barley, wheat berries, quinoa, oats, brown rice, and foods made with them. If you want pasta, go with whole wheat pasta.   Protein power - one-quarter of your plate: Fish, chicken, beans, and nuts are all healthy, versatile protein sources. Limit red meat.   The diet, however, does go beyond the plate, offering a few other suggestions.   Use healthy plant oils, such as olive, canola, soy, corn, sunflower and peanut. Check the labels, and avoid partially hydrogenated oil, which have unhealthy trans fats.   If youre thirsty, drink water. Coffee and tea are good in moderation, but skip sugary drinks and limit milk and dairy products to one or two daily servings.   The type of carbohydrate in the diet is more important than the amount. Some sources of carbohydrates, such as vegetables, fruits, whole grains, and beans-are healthier than others.   Finally, stay active  Signed, Gina Salines, DO  12/31/2020 1:12 PM    [facility]

## 2020-12-31 DIAGNOSIS — R7303 Prediabetes: Secondary | ICD-10-CM | POA: Insufficient documentation

## 2020-12-31 DIAGNOSIS — E7801 Familial hypercholesterolemia: Secondary | ICD-10-CM | POA: Insufficient documentation

## 2020-12-31 DIAGNOSIS — I10 Essential (primary) hypertension: Secondary | ICD-10-CM | POA: Insufficient documentation

## 2021-01-03 DIAGNOSIS — M9901 Segmental and somatic dysfunction of cervical region: Secondary | ICD-10-CM | POA: Diagnosis not present

## 2021-01-03 DIAGNOSIS — M9905 Segmental and somatic dysfunction of pelvic region: Secondary | ICD-10-CM | POA: Diagnosis not present

## 2021-01-03 DIAGNOSIS — M62830 Muscle spasm of back: Secondary | ICD-10-CM | POA: Diagnosis not present

## 2021-01-03 DIAGNOSIS — M9902 Segmental and somatic dysfunction of thoracic region: Secondary | ICD-10-CM | POA: Diagnosis not present

## 2021-01-03 DIAGNOSIS — M5137 Other intervertebral disc degeneration, lumbosacral region: Secondary | ICD-10-CM | POA: Diagnosis not present

## 2021-01-03 DIAGNOSIS — M5442 Lumbago with sciatica, left side: Secondary | ICD-10-CM | POA: Diagnosis not present

## 2021-01-03 DIAGNOSIS — M9903 Segmental and somatic dysfunction of lumbar region: Secondary | ICD-10-CM | POA: Diagnosis not present

## 2021-01-03 DIAGNOSIS — M5417 Radiculopathy, lumbosacral region: Secondary | ICD-10-CM | POA: Diagnosis not present

## 2021-01-03 DIAGNOSIS — M5032 Other cervical disc degeneration, mid-cervical region, unspecified level: Secondary | ICD-10-CM | POA: Diagnosis not present

## 2021-01-04 ENCOUNTER — Encounter: Payer: Self-pay | Admitting: Family Medicine

## 2021-01-04 ENCOUNTER — Ambulatory Visit: Payer: 59 | Attending: Physician Assistant | Admitting: Physical Therapy

## 2021-01-04 ENCOUNTER — Other Ambulatory Visit: Payer: Self-pay

## 2021-01-04 ENCOUNTER — Encounter: Payer: Self-pay | Admitting: Physical Therapy

## 2021-01-04 ENCOUNTER — Other Ambulatory Visit (HOSPITAL_COMMUNITY): Payer: Self-pay

## 2021-01-04 DIAGNOSIS — M545 Low back pain, unspecified: Secondary | ICD-10-CM | POA: Diagnosis not present

## 2021-01-04 DIAGNOSIS — G8929 Other chronic pain: Secondary | ICD-10-CM | POA: Diagnosis not present

## 2021-01-04 DIAGNOSIS — R252 Cramp and spasm: Secondary | ICD-10-CM | POA: Insufficient documentation

## 2021-01-04 DIAGNOSIS — M6281 Muscle weakness (generalized): Secondary | ICD-10-CM | POA: Insufficient documentation

## 2021-01-04 DIAGNOSIS — R279 Unspecified lack of coordination: Secondary | ICD-10-CM | POA: Diagnosis not present

## 2021-01-04 MED ORDER — IPRATROPIUM BROMIDE 0.06 % NA SOLN
2.0000 | Freq: Three times a day (TID) | NASAL | 12 refills | Status: DC | PRN
  Filled 2021-01-04: qty 15, 25d supply, fill #0
  Filled 2021-01-31: qty 15, 25d supply, fill #1
  Filled 2021-02-28: qty 15, 25d supply, fill #2
  Filled 2021-04-04: qty 15, 25d supply, fill #3
  Filled 2021-04-22: qty 15, 14d supply, fill #4
  Filled 2021-08-20: qty 15, 14d supply, fill #5
  Filled 2021-09-18: qty 15, 14d supply, fill #6
  Filled 2021-12-26: qty 15, 14d supply, fill #7

## 2021-01-05 ENCOUNTER — Ambulatory Visit: Payer: 59 | Admitting: Physical Therapy

## 2021-01-05 DIAGNOSIS — R252 Cramp and spasm: Secondary | ICD-10-CM

## 2021-01-05 DIAGNOSIS — M545 Low back pain, unspecified: Secondary | ICD-10-CM | POA: Diagnosis not present

## 2021-01-05 DIAGNOSIS — R279 Unspecified lack of coordination: Secondary | ICD-10-CM | POA: Diagnosis not present

## 2021-01-05 DIAGNOSIS — G8929 Other chronic pain: Secondary | ICD-10-CM | POA: Diagnosis not present

## 2021-01-05 DIAGNOSIS — M6281 Muscle weakness (generalized): Secondary | ICD-10-CM | POA: Diagnosis not present

## 2021-01-05 NOTE — Therapy (Signed)
[facility] [address], [place], [place], [address] Phone: [phone]   Fax:  [phone]  Physical Therapy Treatment  Patient Details  Name: [name] MRN: [id number] Date of Birth: 05/28/1960 Referring Provider (PT): Vladimir Crofts, [place]   Encounter Date: 01/04/2021   PT End of Session - 01/04/21 1619     Visit Number 8    Date for PT Re-Evaluation 03/01/21    Authorization Type [employer] employee    PT Start Time 1533    PT Stop Time 3009    PT Time Calculation (min) 40 min    Activity Tolerance Patient tolerated treatment well    Behavior During Therapy [facility] for tasks assessed/performed             Past Medical History:  Diagnosis Date   Acute bronchitis 05/25/2016   Anxiety    Arthritis    Chronic pain syndrome 01/06/2013   Chronic rhinosinusitis    Colon polyps    Cough 01/06/2013   Depression    Diabetes ([place]) 01/06/2013   Diabetes mellitus type 2 in obese [facility]) 01/06/2013   Sees Dr Calvert Cantor for eye exam Does not see podiatry, foot exam today unremarkable except for thick cracking skin on heals  pt denies    Dyspnea    with exertion    Dysuria 05/25/2016   Edema 01/06/2013   Epistaxis 05/25/2016   Fibroid, uterine    Fibromyalgia    GERD (gastroesophageal reflux disease)    Glaucoma 12-13   Hoarseness    Hoarseness of voice 05/11/2013   Hyperglycemia 04/13/2013   Hyperlipemia, mixed 06/06/2007   Qualifier: Diagnosis of  By: Wynona Luna She feels Lipitor caused increased low back pain and weakness     Hyperlipidemia    Hypertension    Hypokalemia 02/05/2013   IBS (irritable bowel syndrome) 02/13/2011   Low back pain 03/03/2013   Muscle cramp 05/22/2014   Obesity    Obesity, unspecified 05/11/2013   OSA (obstructive sleep apnea)    cpap    Pain in joint, shoulder region 06/27/2015   Perimenopause 10/05/2012   Pre-diabetes    Raynaud disease 06/16/2013   Thyroid disease     hypothyroidism   Vocal fold nodules     Past Surgical History:  Procedure Laterality Date   ABDOMINAL HYSTERECTOMY     BREAST EXCISIONAL BIOPSY Right    at age 11   CHOLECYSTECTOMY     colonoscopoy      [place]     x2   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   Procedure: LAPAROSCOPIC ROUX-EN-Y GASTRIC BYPASS WITH UPPER ENDOSCOPY;  Surgeon: [name], MD;  [facility];  Service: General;  Laterality: N/A;   I & D EXTREMITY Left 04/11/2018   Procedure: DEBIDMENT DISTAL INTERPHALANGEAL LEFT MIDDLE;  Surgeon: Daryll Brod, MD;  Location: [place];  Service: Orthopedics;  Laterality: Left;   INCISIONAL HERNIA REPAIR  08/10/2020   Procedure: LAPAROSCOPIC PRIMARY REPAIR OF INCISIONAL HERNIA;  Surgeon: [name], MD;  Location: [facility];  Service: General;;   MASS EXCISION Left 04/11/2018   Procedure: EXCISION MASS;  Surgeon: Daryll Brod, MD;  Location: [place];  Service: Orthopedics;  Laterality: Left;   OOPHORECTOMY     POLYPECTOMY     ROTATOR CUFF REPAIR     UPPER GI ENDOSCOPY N/A 08/10/2020   Procedure: UPPER GI ENDOSCOPY;  Surgeon: [name], MD;  [facility];  Service: General;  Laterality: N/A;    There were no vitals filed for this visit.   Subjective Assessment - 01/04/21 1536     Subjective I had one time where I had to strain to have a BM.  My back is better since getting an injection    Pertinent History colon polyps, anxiety/depression, GERD, dysuria, hysterectomy, cholecystectomy, incisional hernia repair, gastric bypass surgery    Patient Stated Goals Be able to have BM    Currently in Pain? No/denies                [facility] PT Assessment - 01/05/21 0001       Assessment   Medical Diagnosis K59.00 (ICD-10-CM) - Constipation, unspecified    Referring Provider (PT) Vladimir Crofts, PA-C                        Pelvic Floor Special Questions - 01/05/21 0001      Pelvic Floor Internal Exam pt identity confirmed and informed consent given to perform internal soft tissue assessment    Exam Type Rectal    Tone high    Biofeedback manual feedback to bulge and breathing techniques               [facility] Adult PT Treatment/Exercise - 01/05/21 0001       Manual Therapy   Manual Therapy Internal Pelvic Floor    Myofascial Release bilateral gluteals and lumbar paraspinlas    Internal Pelvic Floor sphincter muscle and puborectalis stretch with contract/relax                       PT Short Term Goals - 12/17/20 1114       PT SHORT TERM GOAL #1   Title able to bulge pelvic floor    Status Achieved      PT SHORT TERM GOAL #2   Title pt will report at least 20% less straining    Baseline had BM every other day this week without straining    Status Achieved               PT Long Term Goals - 01/04/21 1539       PT LONG TERM GOAL #1   Title Pt will report BM at least every 3 days without straining or bearing down    Baseline not consistently    Time 8    Status Partially Met    Target Date 03/01/21      PT LONG TERM GOAL #2   Title Pt will improve lumbar soft tissue mobility for improved low back pain    Baseline pain is better since injection    Status Achieved      PT LONG TERM GOAL #3   Title Pt will imporve pelvic floor strength to at least 3/5 and holding for 10 seconds due to improved coordination without breath holding    Baseline 3/5 unable to hold 10 seconds due to high tone    Time 8    Period Weeks    Status On-going    Target Date 03/01/21      PT LONG TERM GOAL #4   Title Pt will be ind with HEP to maintain improvements and functional level    Time 8    Period Weeks    Status On-going    Target Date 03/01/21  Plan - 01/04/21 1621     Clinical Impression Statement Pt has been doing better with pain management of her low back. She is still having inconsistent BMs.  Pt was  re-assessed for pelvic floor strength and coordination. She has strong muscle test, but lacks coordination and sphincter muscles are tight and has a hard time relaxing afte she engages those muscles  Pt got a little release with soft tissue work today.  Pt will benefit from skilled PT to address core strength and muscle coordination for improved bowel movements and pain management.    Comorbidities colon polyps, anxiety/depression, GERD, dysuria, hysterectomy, cholecystectomy, incisional hernia    Examination-Activity Limitations Continence;Toileting    Examination-Participation Restrictions Community Activity    PT Frequency 2x / week   2x this week to get into aquatic then 1/week   PT Duration 8 weeks    PT Treatment/Interventions ADLs/Self Care Home Management;Biofeedback;Cryotherapy;Software engineer;Therapeutic activities;Therapeutic exercise;Neuromuscular re-education;Patient/family education;Manual techniques;Taping;Dry needling;Passive range of motion    PT Next Visit Plan STM to release pelvic floor along with breathing and bulging, core strength    PT Home Exercise Plan Access Code: [email or web address]    Consulted and Agree with Plan of Care Patient             Patient will benefit from skilled therapeutic intervention in order to improve the following deficits and impairments:  Increased muscle spasms, Pain, Postural dysfunction, Decreased endurance, Decreased coordination, Decreased strength  Visit Diagnosis: Cramp and spasm  Chronic low back pain, unspecified back pain laterality, unspecified whether sciatica present  Muscle weakness (generalized)  Unspecified lack of coordination     Problem List Patient Active Problem List   Diagnosis Date Noted   Familial hypercholesterolemia 12/31/2020   Essential hypertension 12/31/2020   Morbid obesity ([place]) 12/31/2020   Prediabetes 12/31/2020   COVID-19 12/29/2020   Fibromyalgia 12/20/2020   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/11/2020   Chronic idiopathic constipation 07/15/2020   Severe chronic obstructive pulmonary disease ([place]) 05/26/2020   Mild CAD 05/13/2020   Anal pain 04/12/2020   Dysphonia 04/12/2020   Epigastric pain 04/12/2020   Family history of malignant neoplasm of gastrointestinal tract 04/12/2020   Left lower quadrant pain 04/12/2020   Pure hypercholesterolemia 04/12/2020   Thoracic and lumbosacral neuritis 04/12/2020   Vocal fold nodules    Thyroid disease    Hypertension    GERD (gastroesophageal reflux disease)    Fibroid, uterine    Depressive disorder    Chronic rhinosinusitis    Anxiety    Hemorrhoids 02/22/2020   Statin intolerance 12/28/2019   Elevated CK 11/12/2019   Lymphadenopathy 11/12/2019   Neck pain 11/04/2019   Cervical radiculopathy 05/19/2019   Numbness and tingling 04/17/2019   Tachycardia 62/56/3893   Umbilical hernia 73/42/8768   Sun-damaged skin 01/19/2019   Preventative health care 01/19/2019   Right arm pain 01/16/2019   Carpal tunnel syndrome of right wrist 09/04/2018   Entrapment of right ulnar nerve 09/04/2018   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/26/2018   Bilateral hand pain 01/30/2018   Mucoid cyst, joint 01/30/2018   Nodule of finger of both hands 01/10/2018   Weakness 07/03/2017   Osteoarthritis of spine with radiculopathy, cervical region 07/24/2016   Dysuria 05/25/2016   Epistaxis 05/25/2016   Arthritis 05/25/2016   Hypersomnia with sleep apnea 02/18/2016   Obesity (BMI 30.0-34.9) 02/18/2016   Thyroid activity decreased 07/30/2015   Left shoulder pain 06/27/2015   Pain in joint, shoulder region 06/27/2015  Myalgia 03/27/2015   Muscle cramp 05/22/2014   Mass of soft tissue of neck 02/23/2014   AP (abdominal pain) 02/15/2014   Raynaud disease 06/16/2013   Hoarseness of voice 05/11/2013   Backache 03/03/2013   Low back pain 03/03/2013   Hypokalemia 02/05/2013   Edema 01/06/2013   Diabetes mellitus type  2 in obese ([place]) 01/06/2013   Chronic pain syndrome 01/06/2013   Cough 01/06/2013   Diabetes ([place]) 01/06/2013   Perimenopause 10/05/2012   Glaucoma    SOB (shortness of breath) 09/10/2011   Slow transit constipation 02/13/2011   Vitamin D deficiency 02/13/2011   IBS (irritable bowel syndrome) 02/13/2011   RHINITIS, CHRONIC 01/14/2010   Atypical chest pain 06/16/2008   THYROMEGALY 11/11/2007   Allergic rhinitis 11/11/2007   ECZEMA, HANDS 07/22/2007   Hypothyroidism 06/06/2007   Hyperlipemia, mixed 06/06/2007   ADJ DISORDER WITH MIXED ANXIETY & DEPRESSED MOOD 06/06/2007   OSA (obstructive sleep apnea) 06/06/2007   COLONIC POLYPS, HX OF 06/06/2007    Jule Ser, PT 01/05/2021, 6:06 PM  [facility] [address] [place], [place], [address] Phone: [phone]   Fax:  [phone]  Name: [name] MRN: [id number] Date of Birth: 1960-07-02

## 2021-01-05 NOTE — Therapy (Signed)
[facility] [address], [place], [place], [address] Phone: [phone]   Fax:  [phone]  Physical Therapy Treatment  Patient Details  Name: [name] MRN: [id number] Date of Birth: [birth year] Referring Provider (PT): Vladimir Crofts, [place]   Encounter Date: 01/05/2021   PT End of Session - 01/05/21 0923     Visit Number 9    Date for PT Re-Evaluation 03/01/21    Authorization Type [employer] employee    PT Start Time [phone]    PT Stop Time 1012    PT Time Calculation (min) 50 min    Activity Tolerance Patient tolerated treatment well    Behavior During Therapy [facility] for tasks assessed/performed             Past Medical History:  Diagnosis Date   Acute bronchitis 05/25/2016   Anxiety    Arthritis    Chronic pain syndrome 01/06/2013   Chronic rhinosinusitis    Colon polyps    Cough 01/06/2013   Depression    Diabetes ([place]) 01/06/2013   Diabetes mellitus type 2 in obese [facility]) 01/06/2013   Sees Dr Calvert Cantor for eye exam Does not see podiatry, foot exam today unremarkable except for thick cracking skin on heals  pt denies    Dyspnea    with exertion    Dysuria 05/25/2016   Edema 01/06/2013   Epistaxis 05/25/2016   Fibroid, uterine    Fibromyalgia    GERD (gastroesophageal reflux disease)    Glaucoma 12-13   Hoarseness    Hoarseness of voice 05/11/2013   Hyperglycemia 04/13/2013   Hyperlipemia, mixed 06/06/2007   Qualifier: Diagnosis of  By: Wynona Luna She feels Lipitor caused increased low back pain and weakness     Hyperlipidemia    Hypertension    Hypokalemia 02/05/2013   IBS (irritable bowel syndrome) 02/13/2011   Low back pain 03/03/2013   Muscle cramp 05/22/2014   Obesity    Obesity, unspecified 05/11/2013   OSA (obstructive sleep apnea)    cpap    Pain in joint, shoulder region 06/27/2015   Perimenopause 10/05/2012   Pre-diabetes    Raynaud disease 06/16/2013   Thyroid disease     hypothyroidism   Vocal fold nodules     Past Surgical History:  Procedure Laterality Date   ABDOMINAL HYSTERECTOMY     BREAST EXCISIONAL BIOPSY Right    at age 31   CHOLECYSTECTOMY     colonoscopoy      [place]     x2   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   Procedure: LAPAROSCOPIC ROUX-EN-Y GASTRIC BYPASS WITH UPPER ENDOSCOPY;  Surgeon: [name], MD;  [facility];  Service: General;  Laterality: N/A;   I & D EXTREMITY Left 04/11/2018   Procedure: DEBIDMENT DISTAL INTERPHALANGEAL LEFT MIDDLE;  Surgeon: Daryll Brod, MD;  Location: [place];  Service: Orthopedics;  Laterality: Left;   INCISIONAL HERNIA REPAIR  08/10/2020   Procedure: LAPAROSCOPIC PRIMARY REPAIR OF INCISIONAL HERNIA;  Surgeon: [name], MD;  [facility];  Service: General;;   MASS EXCISION Left 04/11/2018   Procedure: EXCISION MASS;  Surgeon: Daryll Brod, MD;  Location: [place];  Service: Orthopedics;  Laterality: Left;   OOPHORECTOMY     POLYPECTOMY     ROTATOR CUFF REPAIR     UPPER GI ENDOSCOPY N/A 08/10/2020   Procedure: UPPER GI ENDOSCOPY;  Surgeon: [name], MD;  [facility];  Service: General;  Laterality: N/A;    There were no vitals filed for this visit.   Subjective Assessment - 01/05/21 0924     Subjective I worked out [employer] this AM, back fatigue but ok, a little soreness.    Currently in Pain? No/denies             Treatment: Patient seen for aquatic therapy today.  Treatment took place in water 2.5-4 feet deep depending upon activity.  Pt entered the pool via stairs, reciprocally with mild use of rails. Water temp 93 degrees F. Pt requires buoyancy of water for support and to offload joints with strengthening exercises.    Seated water bench with 75% submersion Pt performed seated LE AROM exercises 20x in all planes, pain assessment, educated pt in water principles, discussion of future aquatic exercises  plans/goals.  Standing in 75% submersion: Water walking with small noodle for postural support: 6x in each direction. Pt requires VC to relax when walking, tends to be rigid. Wall exercises with light UE support for the following Bil: 10-15x hip flex,ext, circumduction, abd/add, heel raises, squats, abdominal compressions with back support using nekadoodle 10x 3 sec hold, high knee marching with small noodle 4x short length of pool with VC to feel how her abdominals contribute to the lifting of the knee.   Seated decompression  float for lumbar decompression x 5 min                           PT Education - 01/05/21 1015     Education Details Water principles    Person(s) Educated Patient    Methods Explanation    Comprehension Verbalized understanding              PT Short Term Goals - 12/17/20 1114       PT SHORT TERM GOAL #1   Title able to bulge pelvic floor    Status Achieved      PT SHORT TERM GOAL #2   Title pt will report at least 20% less straining    Baseline had BM every other day this week without straining    Status Achieved               PT Long Term Goals - 01/04/21 1539       PT LONG TERM GOAL #1   Title Pt will report BM at least every 3 days without straining or bearing down    Baseline not consistently    Status Partially Met      PT LONG TERM GOAL #2   Title Pt will improve lumbar soft tissue mobility for improved low back pain    Baseline pain is better since injection    Status Achieved      PT LONG TERM GOAL #3   Title Pt will imporve pelvic floor strength to at least 3/5 and holding for 10 seconds due to improved coordination without breath holding    Baseline 3/5 unable to hold 10 seconds due to high tone      PT LONG TERM GOAL #4   Title Pt will be ind with HEP to maintain improvements and functional level    Status On-going                   Plan - 01/05/21 1016     Clinical Impression Statement  Pt arrives for first aquatic  session for this POC. Pt arrives painfree in her back and reports already doing a light workout at [facility] this AM. She verbally reports noticing a fatigue in her back muscles when she exercises. Pt was educated in water principles and how we would use them today and going forward.    Personal Factors and Comorbidities Comorbidity 3+    Comorbidities colon polyps, anxiety/depression, GERD, dysuria, hysterectomy, cholecystectomy, incisional hernia    Examination-Activity Limitations Continence;Toileting    Examination-Participation Restrictions Community Activity    Stability/Clinical Decision Making Evolving/Moderate complexity    Rehab Potential Excellent    PT Frequency 1x / week    PT Duration 12 weeks    PT Treatment/Interventions ADLs/Self Care Home Management;Biofeedback;Cryotherapy;Software engineer;Therapeutic activities;Therapeutic exercise;Neuromuscular re-education;Patient/family education;Manual techniques;Taping;Dry needling;Passive range of motion    PT Next Visit Plan f/u on visit with Dr. Georgina Snell for low back; re-eval and goals    PT Home Exercise Plan Access Code: [email or web address]    Consulted and Agree with Plan of Care Patient             Patient will benefit from skilled therapeutic intervention in order to improve the following deficits and impairments:  Increased muscle spasms, Pain, Postural dysfunction, Decreased endurance, Decreased coordination, Decreased strength  Visit Diagnosis: Cramp and spasm  Chronic low back pain, unspecified back pain laterality, unspecified whether sciatica present  Muscle weakness (generalized)  Unspecified lack of coordination     Problem List Patient Active Problem List   Diagnosis Date Noted   Familial hypercholesterolemia 12/31/2020   Essential hypertension 12/31/2020   Morbid obesity ([place]) 12/31/2020   Prediabetes 12/31/2020   COVID-19 12/29/2020   Fibromyalgia 12/20/2020    Gastric bypass status for obesity 08/11/2020   Chronic idiopathic constipation 07/15/2020   Severe chronic obstructive pulmonary disease ([place]) 05/26/2020   Mild CAD 05/13/2020   Anal pain 04/12/2020   Dysphonia 04/12/2020   Epigastric pain 04/12/2020   Family history of malignant neoplasm of gastrointestinal tract 04/12/2020   Left lower quadrant pain 04/12/2020   Pure hypercholesterolemia 04/12/2020   Thoracic and lumbosacral neuritis 04/12/2020   Vocal fold nodules    Thyroid disease    Hypertension    GERD (gastroesophageal reflux disease)    Fibroid, uterine    Depressive disorder    Chronic rhinosinusitis    Anxiety    Hemorrhoids 02/22/2020   Statin intolerance 12/28/2019   Elevated CK 11/12/2019   Lymphadenopathy 11/12/2019   Neck pain 11/04/2019   Cervical radiculopathy 05/19/2019   Numbness and tingling 04/17/2019   Tachycardia 69/62/9528   Umbilical hernia 41/32/4401   Sun-damaged skin 01/19/2019   Preventative health care 01/19/2019   Right arm pain 01/16/2019   Carpal tunnel syndrome of right wrist 09/04/2018   Entrapment of right ulnar nerve 09/04/2018   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/26/2018   Bilateral hand pain 01/30/2018   Mucoid cyst, joint 01/30/2018   Nodule of finger of both hands 01/10/2018   Weakness 07/03/2017   Osteoarthritis of spine with radiculopathy, cervical region 07/24/2016   Dysuria 05/25/2016   Epistaxis 05/25/2016   Arthritis 05/25/2016   Hypersomnia with sleep apnea 02/18/2016   Obesity (BMI 30.0-34.9) 02/18/2016   Thyroid activity decreased 07/30/2015   Left shoulder pain 06/27/2015   Pain in joint, shoulder region 06/27/2015   Myalgia 03/27/2015   Muscle cramp 05/22/2014   Mass of soft tissue of neck 02/23/2014   AP (abdominal pain) 02/15/2014   Raynaud disease 06/16/2013   Hoarseness of  voice 05/11/2013   Backache 03/03/2013   Low back pain 03/03/2013   Hypokalemia 02/05/2013   Edema 01/06/2013   Diabetes  mellitus type 2 in obese ([place]) 01/06/2013   Chronic pain syndrome 01/06/2013   Cough 01/06/2013   Diabetes ([place]) 01/06/2013   Perimenopause 10/05/2012   Glaucoma    SOB (shortness of breath) 09/10/2011   Slow transit constipation 02/13/2011   Vitamin D deficiency 02/13/2011   IBS (irritable bowel syndrome) 02/13/2011   RHINITIS, CHRONIC 01/14/2010   Atypical chest pain 06/16/2008   THYROMEGALY 11/11/2007   Allergic rhinitis 11/11/2007   ECZEMA, HANDS 07/22/2007   Hypothyroidism 06/06/2007   Hyperlipemia, mixed 06/06/2007   ADJ DISORDER WITH MIXED ANXIETY & DEPRESSED MOOD 06/06/2007   OSA (obstructive sleep apnea) 06/06/2007   COLONIC POLYPS, HX OF 06/06/2007    Marguriete Wootan, PTA 01/05/2021, 12:32 PM  [facility] [address], [place], [place], [address] Phone: [phone]   Fax:  [phone]  Name: HILMA STEINHILBER MRN: [id number] Date of Birth: 12-10-1960

## 2021-01-10 ENCOUNTER — Encounter: Payer: Self-pay | Admitting: Family Medicine

## 2021-01-10 ENCOUNTER — Other Ambulatory Visit (INDEPENDENT_AMBULATORY_CARE_PROVIDER_SITE_OTHER): Payer: 59

## 2021-01-10 ENCOUNTER — Other Ambulatory Visit (HOSPITAL_BASED_OUTPATIENT_CLINIC_OR_DEPARTMENT_OTHER): Payer: Self-pay

## 2021-01-10 ENCOUNTER — Other Ambulatory Visit: Payer: Self-pay

## 2021-01-10 DIAGNOSIS — M791 Myalgia, unspecified site: Secondary | ICD-10-CM | POA: Diagnosis not present

## 2021-01-10 DIAGNOSIS — M5032 Other cervical disc degeneration, mid-cervical region, unspecified level: Secondary | ICD-10-CM | POA: Diagnosis not present

## 2021-01-10 DIAGNOSIS — M5137 Other intervertebral disc degeneration, lumbosacral region: Secondary | ICD-10-CM | POA: Diagnosis not present

## 2021-01-10 DIAGNOSIS — M5442 Lumbago with sciatica, left side: Secondary | ICD-10-CM | POA: Diagnosis not present

## 2021-01-10 DIAGNOSIS — M9901 Segmental and somatic dysfunction of cervical region: Secondary | ICD-10-CM | POA: Diagnosis not present

## 2021-01-10 DIAGNOSIS — E669 Obesity, unspecified: Secondary | ICD-10-CM

## 2021-01-10 DIAGNOSIS — E039 Hypothyroidism, unspecified: Secondary | ICD-10-CM | POA: Diagnosis not present

## 2021-01-10 DIAGNOSIS — E1169 Type 2 diabetes mellitus with other specified complication: Secondary | ICD-10-CM

## 2021-01-10 DIAGNOSIS — M9905 Segmental and somatic dysfunction of pelvic region: Secondary | ICD-10-CM | POA: Diagnosis not present

## 2021-01-10 DIAGNOSIS — E782 Mixed hyperlipidemia: Secondary | ICD-10-CM | POA: Diagnosis not present

## 2021-01-10 DIAGNOSIS — M9903 Segmental and somatic dysfunction of lumbar region: Secondary | ICD-10-CM | POA: Diagnosis not present

## 2021-01-10 DIAGNOSIS — M9902 Segmental and somatic dysfunction of thoracic region: Secondary | ICD-10-CM | POA: Diagnosis not present

## 2021-01-10 DIAGNOSIS — M5417 Radiculopathy, lumbosacral region: Secondary | ICD-10-CM | POA: Diagnosis not present

## 2021-01-10 DIAGNOSIS — M62830 Muscle spasm of back: Secondary | ICD-10-CM | POA: Diagnosis not present

## 2021-01-10 LAB — LIPID PANEL
Cholesterol: 104 mg/dL (ref 0–200)
HDL: 60 mg/dL (ref >39.00)
LDL Cholesterol: 25 mg/dL (ref 0–99)
NonHDL: 43.55
Total CHOL/HDL Ratio: 2
Triglycerides: 92 mg/dL (ref 0.0–149.0)
VLDL: 18.4 mg/dL (ref 0.0–40.0)

## 2021-01-10 LAB — COMPREHENSIVE METABOLIC PANEL
ALT: 51 U/L — ABNORMAL HIGH (ref 0–35)
AST: 28 U/L (ref 0–37)
Albumin: 4.1 g/dL (ref 3.5–5.2)
Alkaline Phosphatase: 102 U/L (ref 39–117)
BUN: 10 mg/dL (ref 6–23)
CO2: 27 mEq/L (ref 19–32)
Calcium: 9.9 mg/dL (ref 8.4–10.5)
Chloride: 103 mEq/L (ref 96–112)
Creatinine, Ser: 0.79 mg/dL (ref 0.40–1.20)
GFR: 81.48 mL/min (ref >60.00)
Glucose, Bld: 85 mg/dL (ref 70–99)
Potassium: 4.1 mEq/L (ref 3.5–5.1)
Sodium: 140 mEq/L (ref 135–145)
Total Bilirubin: 0.6 mg/dL (ref 0.2–1.2)
Total Protein: 6.9 g/dL (ref 6.0–8.3)

## 2021-01-10 LAB — CBC
HCT: 41.4 % (ref 36.0–46.0)
Hemoglobin: 13.7 g/dL (ref 12.0–15.0)
MCHC: 33.2 g/dL (ref 30.0–36.0)
MCV: 92 fl (ref 78.0–100.0)
Platelets: 366 10*3/uL (ref 150.0–400.0)
RBC: 4.5 Mil/uL (ref 3.87–5.11)
RDW: 13.8 % (ref 11.5–15.5)
WBC: 4.8 10*3/uL (ref 4.0–10.5)

## 2021-01-10 LAB — HEMOGLOBIN A1C: Hgb A1c MFr Bld: 5.9 % (ref 4.6–6.5)

## 2021-01-10 LAB — CK: Total CK: 97 U/L (ref 7–177)

## 2021-01-10 MED ORDER — FLUARIX QUADRIVALENT 0.5 ML IM SUSY
PREFILLED_SYRINGE | INTRAMUSCULAR | 0 refills | Status: DC
  Filled 2021-01-10: qty 0.5, 1d supply, fill #0

## 2021-01-11 ENCOUNTER — Encounter: Payer: Self-pay | Admitting: Physical Therapy

## 2021-01-11 ENCOUNTER — Ambulatory Visit (INDEPENDENT_AMBULATORY_CARE_PROVIDER_SITE_OTHER): Payer: 59 | Admitting: Professional

## 2021-01-11 ENCOUNTER — Ambulatory Visit: Payer: 59 | Admitting: Physical Therapy

## 2021-01-11 ENCOUNTER — Encounter: Payer: Self-pay | Admitting: Family Medicine

## 2021-01-11 ENCOUNTER — Other Ambulatory Visit (HOSPITAL_COMMUNITY): Payer: Self-pay

## 2021-01-11 DIAGNOSIS — M545 Low back pain, unspecified: Secondary | ICD-10-CM | POA: Diagnosis not present

## 2021-01-11 DIAGNOSIS — M6281 Muscle weakness (generalized): Secondary | ICD-10-CM | POA: Diagnosis not present

## 2021-01-11 DIAGNOSIS — R252 Cramp and spasm: Secondary | ICD-10-CM | POA: Diagnosis not present

## 2021-01-11 DIAGNOSIS — G8929 Other chronic pain: Secondary | ICD-10-CM | POA: Diagnosis not present

## 2021-01-11 DIAGNOSIS — R279 Unspecified lack of coordination: Secondary | ICD-10-CM | POA: Diagnosis not present

## 2021-01-11 DIAGNOSIS — F411 Generalized anxiety disorder: Secondary | ICD-10-CM

## 2021-01-11 NOTE — Patient Instructions (Signed)
Access Code: [email or web address] URL: [email or web address] Date: 01/11/2021 Prepared by: Jari Favre  Exercises Supine Butterfly Groin Stretch - 1 x daily - 7 x weekly - 1 sets - 3 reps - 30 sec hold Supine Figure 4 Piriformis Stretch - 1 x daily - 7 x weekly - 1 sets - 3 reps - 30 sec hold Supine Hamstring Stretch - 1 x daily - 7 x weekly - 1 sets - 3 reps - 30 sec hold Supine Pelvic Floor Stretch - Hands on Knees - 1 x daily - 7 x weekly - 1 sets - 3 reps - 30 hold Dead Bug - 1 x daily - 7 x weekly - 3 sets - 10 reps Standing Hip Flexor Stretch - 3 x daily - 7 x weekly - 2 reps - 1 sets - 30 sec hold Static Prone on Elbows - 1 x daily - 7 x weekly - 3 sets - 10 reps Standing Hip Flexion Extension at [employer] - 1 x daily - 7 x weekly - 3 sets - 10 reps Squat - 1 x daily - 7 x weekly - 3 sets - 10 reps Side Stepping - 1 x daily - 7 x weekly - 3 sets - 10 reps Forward Walking - 1 x daily - 7 x weekly - 3 sets - 10 reps Backward Walking - 1 x daily - 7 x weekly - 3 sets - 10 reps Bilateral Shoulder Horizontal Abduction Adduction AROM - 1 x daily - 7 x weekly - 3 sets - 10 reps Right Standing Lateral Shift Correction at Wall - Hold - 1 x daily - 7 x weekly - 3 sets - 10 reps

## 2021-01-11 NOTE — Therapy (Signed)
[facility] [address] [place], [place], [address] Phone: [phone]   Fax:  [phone]  Physical Therapy Treatment  Patient Details  Name: [name] MRN: [id number] Date of Birth: [birth year] Referring Provider (PT): Vladimir Crofts, [place]   Encounter Date: 01/11/2021   PT End of Session - 01/11/21 1540     Visit Number 10    Date for PT Re-Evaluation 03/01/21    Authorization Type [employer] employee    PT Start Time 1448    PT Stop Time 0658    PT Time Calculation (min) 45 min    Activity Tolerance Patient tolerated treatment well    Behavior During Therapy [facility] for tasks assessed/performed             Past Medical History:  Diagnosis Date   Acute bronchitis 05/25/2016   Anxiety    Arthritis    Chronic pain syndrome 01/06/2013   Chronic rhinosinusitis    Colon polyps    Cough 01/06/2013   Depression    Diabetes ([place]) 01/06/2013   Diabetes mellitus type 2 in obese [facility]) 01/06/2013   Sees Dr Calvert Cantor for eye exam Does not see podiatry, foot exam today unremarkable except for thick cracking skin on heals  pt denies    Dyspnea    with exertion    Dysuria 05/25/2016   Edema 01/06/2013   Epistaxis 05/25/2016   Fibroid, uterine    Fibromyalgia    GERD (gastroesophageal reflux disease)    Glaucoma 12-13   Hoarseness    Hoarseness of voice 05/11/2013   Hyperglycemia 04/13/2013   Hyperlipemia, mixed 06/06/2007   Qualifier: Diagnosis of  By: Wynona Luna She feels Lipitor caused increased low back pain and weakness     Hyperlipidemia    Hypertension    Hypokalemia 02/05/2013   IBS (irritable bowel syndrome) 02/13/2011   Low back pain 03/03/2013   Muscle cramp 05/22/2014   Obesity    Obesity, unspecified 05/11/2013   OSA (obstructive sleep apnea)    cpap    Pain in joint, shoulder region 06/27/2015   Perimenopause 10/05/2012   Pre-diabetes    Raynaud disease 06/16/2013   Thyroid disease     hypothyroidism   Vocal fold nodules     Past Surgical History:  Procedure Laterality Date   ABDOMINAL HYSTERECTOMY     BREAST EXCISIONAL BIOPSY Right    at age 80   CHOLECYSTECTOMY     colonoscopoy      [place]     x2   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   Procedure: LAPAROSCOPIC ROUX-EN-Y GASTRIC BYPASS WITH UPPER ENDOSCOPY;  Surgeon: [name], MD;  Location[facility];  Service: General;  Laterality: N/A;   I & D EXTREMITY Left 04/11/2018   Procedure: DEBIDMENT DISTAL INTERPHALANGEAL LEFT MIDDLE;  Surgeon: Daryll Brod, MD;  Location: [place];  Service: Orthopedics;  Laterality: Left;   INCISIONAL HERNIA REPAIR  08/10/2020   Procedure: LAPAROSCOPIC PRIMARY REPAIR OF INCISIONAL HERNIA;  Surgeon: [name], MD;  Location: [facility];  Service: General;;   MASS EXCISION Left 04/11/2018   Procedure: EXCISION MASS;  Surgeon: Daryll Brod, MD;  Location: [place];  Service: Orthopedics;  Laterality: Left;   OOPHORECTOMY     POLYPECTOMY     ROTATOR CUFF REPAIR     UPPER GI ENDOSCOPY N/A 08/10/2020   Procedure: UPPER GI ENDOSCOPY;  Surgeon: [name], MD;  [facility];  Service: General;  Laterality: N/A;    There were no vitals filed for this visit.   Subjective Assessment - 01/11/21 1454     Subjective I had an accident yesterday.  I am not haing to push as much    Patient Stated Goals Be able to have BM    Currently in Pain? Yes    Pain Score 7     Pain Location Back    Pain Orientation Left    Pain Descriptors / Indicators Tingling    Pain Radiating Towards left hip and thigh    Pain Onset 1 to 4 weeks ago    Pain Frequency Intermittent    Multiple Pain Sites No                               [facility] Adult PT Treatment/Exercise - 01/11/21 0001       Lumbar Exercises: Standing   Other Standing Lumbar Exercises lateral shift to the right      Manual Therapy   Manual Therapy  Internal Pelvic Floor    Manual therapy comments pt identity confirmed and internal soft tissue treatment with consent    Myofascial Release bilateral gluteals and lumbar paraspinlas    Internal Pelvic Floor sphincter muscle and puborectalis stretch with contract/relax                     PT Education - 01/11/21 1539     Education Details Access Code: [email or web address]    Person(s) Educated Patient    Methods Explanation;Demonstration;Tactile cues;Verbal cues;Handout    Comprehension Verbalized understanding;Returned demonstration              PT Short Term Goals - 12/17/20 1114       PT SHORT TERM GOAL #1   Title able to bulge pelvic floor    Status Achieved      PT SHORT TERM GOAL #2   Title pt will report at least 20% less straining    Baseline had BM every other day this week without straining    Status Achieved               PT Long Term Goals - 01/11/21 1548       PT LONG TERM GOAL #1   Title Pt will report BM at least every 3 days without straining or bearing down    Baseline not consistently    Status Partially Met      PT LONG TERM GOAL #2   Title Pt will improve lumbar soft tissue mobility for improved low back pain      PT LONG TERM GOAL #3   Title Pt will imporve pelvic floor strength to at least 3/5 and holding for 10 seconds due to improved coordination without breath holding    Status On-going      PT LONG TERM GOAL #4   Title Pt will be ind with HEP to maintain improvements and functional level    Status On-going                   Plan - 01/11/21 1540     Clinical Impression Statement Pt still having some tingling down to her leg and into her foot.  Pain seems to be relieved with lateral shift right so she was educated on how to do this at home as well as towel  roll under her waist when left sidelying.  Pt was more TTP left pubococcygeus.  She tolerates very gentle fascial release and gentle coccyx mobs. Pt will benefit from  skilled PT to continue to work on core and hip strength with aquatics and neuro retraining with tactile cues to relax pelvic floor.    PT Treatment/Interventions ADLs/Self Care Home Management;Biofeedback;Cryotherapy;Software engineer;Therapeutic activities;Therapeutic exercise;Neuromuscular re-education;Patient/family education;Manual techniques;Taping;Dry needling;Passive range of motion    PT Next Visit Plan aquatics to continue working on core and postural strength; pelvic to work on improved muscle length and breathing and bulging pelvic floor muscles for improved BMs    PT Home Exercise Plan Access Code: [email or web address]    Consulted and Agree with Plan of Care Patient             Patient will benefit from skilled therapeutic intervention in order to improve the following deficits and impairments:  Increased muscle spasms, Pain, Postural dysfunction, Decreased endurance, Decreased coordination, Decreased strength  Visit Diagnosis: Cramp and spasm  Chronic low back pain, unspecified back pain laterality, unspecified whether sciatica present  Muscle weakness (generalized)  Unspecified lack of coordination     Problem List Patient Active Problem List   Diagnosis Date Noted   Familial hypercholesterolemia 12/31/2020   Essential hypertension 12/31/2020   Morbid obesity ([place]) 12/31/2020   Prediabetes 12/31/2020   COVID-19 12/29/2020   Fibromyalgia 12/20/2020   Gastric bypass status for obesity 08/11/2020   Chronic idiopathic constipation 07/15/2020   Severe chronic obstructive pulmonary disease ([place]) 05/26/2020   Mild CAD 05/13/2020   Anal pain 04/12/2020   Dysphonia 04/12/2020   Epigastric pain 04/12/2020   Family history of malignant neoplasm of gastrointestinal tract 04/12/2020   Left lower quadrant pain 04/12/2020   Pure hypercholesterolemia 04/12/2020   Thoracic and lumbosacral neuritis 04/12/2020   Vocal fold nodules    Thyroid disease     Hypertension    GERD (gastroesophageal reflux disease)    Fibroid, uterine    Depressive disorder    Chronic rhinosinusitis    Anxiety    Hemorrhoids 02/22/2020   Statin intolerance 12/28/2019   Elevated CK 11/12/2019   Lymphadenopathy 11/12/2019   Neck pain 11/04/2019   Cervical radiculopathy 05/19/2019   Numbness and tingling 04/17/2019   Tachycardia 13/14/3888   Umbilical hernia 75/79/7282   Sun-damaged skin 01/19/2019   Preventative health care 01/19/2019   Right arm pain 01/16/2019   Carpal tunnel syndrome of right wrist 09/04/2018   Entrapment of right ulnar nerve 09/04/2018   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/26/2018   Bilateral hand pain 01/30/2018   Mucoid cyst, joint 01/30/2018   Nodule of finger of both hands 01/10/2018   Weakness 07/03/2017   Osteoarthritis of spine with radiculopathy, cervical region 07/24/2016   Dysuria 05/25/2016   Epistaxis 05/25/2016   Arthritis 05/25/2016   Hypersomnia with sleep apnea 02/18/2016   Obesity (BMI 30.0-34.9) 02/18/2016   Thyroid activity decreased 07/30/2015   Left shoulder pain 06/27/2015   Pain in joint, shoulder region 06/27/2015   Myalgia 03/27/2015   Muscle cramp 05/22/2014   Mass of soft tissue of neck 02/23/2014   AP (abdominal pain) 02/15/2014   Raynaud disease 06/16/2013   Hoarseness of voice 05/11/2013   Backache 03/03/2013   Low back pain 03/03/2013   Hypokalemia 02/05/2013   Edema 01/06/2013   Diabetes mellitus type 2 in obese ([place]) 01/06/2013   Chronic pain syndrome 01/06/2013   Cough 01/06/2013   Diabetes ([place]) 01/06/2013  Perimenopause 10/05/2012   Glaucoma    SOB (shortness of breath) 09/10/2011   Slow transit constipation 02/13/2011   Vitamin D deficiency 02/13/2011   IBS (irritable bowel syndrome) 02/13/2011   RHINITIS, CHRONIC 01/14/2010   Atypical chest pain 06/16/2008   THYROMEGALY 11/11/2007   Allergic rhinitis 11/11/2007   ECZEMA, HANDS 07/22/2007   Hypothyroidism 06/06/2007    Hyperlipemia, mixed 06/06/2007   ADJ DISORDER WITH MIXED ANXIETY & DEPRESSED MOOD 06/06/2007   OSA (obstructive sleep apnea) 06/06/2007   COLONIC POLYPS, HX OF 06/06/2007    Jule Ser, PT 01/11/2021, 3:54 PM  [facility] [address], [place], [place], [address] Phone: [phone]   Fax:  [phone]  Name: [name] MRN: [id number] Date of Birth: 09-Jan-1961

## 2021-01-12 ENCOUNTER — Ambulatory Visit: Payer: 59 | Admitting: Physical Therapy

## 2021-01-12 ENCOUNTER — Ambulatory Visit: Payer: 59 | Admitting: Skilled Nursing Facility1

## 2021-01-12 ENCOUNTER — Other Ambulatory Visit: Payer: Self-pay

## 2021-01-12 ENCOUNTER — Other Ambulatory Visit (HOSPITAL_BASED_OUTPATIENT_CLINIC_OR_DEPARTMENT_OTHER): Payer: Self-pay

## 2021-01-12 DIAGNOSIS — G8929 Other chronic pain: Secondary | ICD-10-CM

## 2021-01-12 DIAGNOSIS — M6281 Muscle weakness (generalized): Secondary | ICD-10-CM | POA: Diagnosis not present

## 2021-01-12 DIAGNOSIS — R279 Unspecified lack of coordination: Secondary | ICD-10-CM | POA: Diagnosis not present

## 2021-01-12 DIAGNOSIS — M545 Low back pain, unspecified: Secondary | ICD-10-CM

## 2021-01-12 DIAGNOSIS — R252 Cramp and spasm: Secondary | ICD-10-CM | POA: Diagnosis not present

## 2021-01-12 NOTE — Therapy (Signed)
[facility] [address] [place], [place], [address] Phone: [phone]   Fax:  [phone]  Physical Therapy Treatment  Patient Details  Name: [name] MRN: [id number] Date of Birth: [birth year] Referring Provider (PT): Vladimir Crofts, [place]   Encounter Date: 01/12/2021   PT End of Session - 01/12/21 0920     Visit Number 11    Date for PT Re-Evaluation 03/01/21    Authorization Type [employer] employee    PT Start Time [phone]    PT Stop Time 1015    PT Time Calculation (min) 57 min    Activity Tolerance Patient tolerated treatment well    Behavior During Therapy [facility] for tasks assessed/performed             Past Medical History:  Diagnosis Date   Acute bronchitis 05/25/2016   Anxiety    Arthritis    Chronic pain syndrome 01/06/2013   Chronic rhinosinusitis    Colon polyps    Cough 01/06/2013   Depression    Diabetes ([place]) 01/06/2013   Diabetes mellitus type 2 in obese [facility]) 01/06/2013   Sees Dr Calvert Cantor for eye exam Does not see podiatry, foot exam today unremarkable except for thick cracking skin on heals  pt denies    Dyspnea    with exertion    Dysuria 05/25/2016   Edema 01/06/2013   Epistaxis 05/25/2016   Fibroid, uterine    Fibromyalgia    GERD (gastroesophageal reflux disease)    Glaucoma 12-13   Hoarseness    Hoarseness of voice 05/11/2013   Hyperglycemia 04/13/2013   Hyperlipemia, mixed 06/06/2007   Qualifier: Diagnosis of  By: Wynona Luna She feels Lipitor caused increased low back pain and weakness     Hyperlipidemia    Hypertension    Hypokalemia 02/05/2013   IBS (irritable bowel syndrome) 02/13/2011   Low back pain 03/03/2013   Muscle cramp 05/22/2014   Obesity    Obesity, unspecified 05/11/2013   OSA (obstructive sleep apnea)    cpap    Pain in joint, shoulder region 06/27/2015   Perimenopause 10/05/2012   Pre-diabetes    Raynaud disease 06/16/2013   Thyroid disease     hypothyroidism   Vocal fold nodules     Past Surgical History:  Procedure Laterality Date   ABDOMINAL HYSTERECTOMY     BREAST EXCISIONAL BIOPSY Right    at age 67   CHOLECYSTECTOMY     colonoscopoy      [place]     x2   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   Procedure: LAPAROSCOPIC ROUX-EN-Y GASTRIC BYPASS WITH UPPER ENDOSCOPY;  Surgeon: [name], MD;  [facility];  Service: General;  Laterality: N/A;   I & D EXTREMITY Left 04/11/2018   Procedure: DEBIDMENT DISTAL INTERPHALANGEAL LEFT MIDDLE;  Surgeon: Daryll Brod, MD;  Location: [place];  Service: Orthopedics;  Laterality: Left;   INCISIONAL HERNIA REPAIR  08/10/2020   Procedure: LAPAROSCOPIC PRIMARY REPAIR OF INCISIONAL HERNIA;  Surgeon: [name], MD;  Location[facility];  Service: General;;   MASS EXCISION Left 04/11/2018   Procedure: EXCISION MASS;  Surgeon: Daryll Brod, MD;  Location: [place];  Service: Orthopedics;  Laterality: Left;   OOPHORECTOMY     POLYPECTOMY     ROTATOR CUFF REPAIR     UPPER GI ENDOSCOPY N/A 08/10/2020   Procedure: UPPER GI ENDOSCOPY;  Surgeon: [name], MD;  [facility];  Service: General;  Laterality: N/A;    There were no vitals filed for this visit.   Subjective Assessment - 01/12/21 1111     Subjective I am doing better, [name] helped me out a lot yesterday. I have just a little tingle in my foot and Lt hip.    Pertinent History colon polyps, anxiety/depression, GERD, dysuria, hysterectomy, cholecystectomy, incisional hernia repair, gastric bypass surgery    Currently in Pain? Yes    Pain Score 2     Pain Location --   Tingling in her Lt foot and hip anteriorly   Aggravating Factors  Maybe sitting too long    Pain Relieving Factors I think exercising             Treatment: Patient seen for aquatic therapy today.  Treatment took place in water 2.5-4 feet deep depending upon activity.  Pt entered the  pool via stairs, reciprocally. Pt requires buoyancy of water for support and to offload joints with strengthening exercises.  Water temp 93 degrees F. Seated water bench with 75% submersion Pt performed seated LE AROM exercises 20x in all planes, concurrent update on status and pain. Standing in mid chest depth for the following; Water walking in a 4 directions 10x each with small noodle for postural support.  High knee marching with emphasis on core contraction 4 lengths of pool Wall exs: 15 each Bil hip 3 ways, heel raises, mini squats, VC to control LTLE, circumduction Lt quad stretch 2x 20 sec Abdominal compressions with blue noodle 10x 5 sec hold Hamstring stretch Bil with ankle DF/PF Bil Large noodle behind pt for bicycle 1 min, 1 min rest; 3 sets                            PT Short Term Goals - 12/17/20 1114       PT SHORT TERM GOAL #1   Title able to bulge pelvic floor    Status Achieved      PT SHORT TERM GOAL #2   Title pt will report at least 20% less straining    Baseline had BM every other day this week without straining    Status Achieved               PT Long Term Goals - 01/11/21 1548       PT LONG TERM GOAL #1   Title Pt will report BM at least every 3 days without straining or bearing down    Baseline not consistently    Status Partially Met      PT LONG TERM GOAL #2   Title Pt will improve lumbar soft tissue mobility for improved low back pain      PT LONG TERM GOAL #3   Title Pt will imporve pelvic floor strength to at least 3/5 and holding for 10 seconds due to improved coordination without breath holding    Status On-going      PT LONG TERM GOAL #4   Title Pt will be ind with HEP to maintain improvements and functional level    Status On-going                   Plan - 01/12/21 1113     Clinical Impression Statement Pt tolerating her aquatic PT very well. She reports today she had no flare up of pain post  initial aquatic session. Pt  presented today with "justa little tingling" in her Lt foot and hip anteriorly. LTLE deomnstrates fatigue with aquatic exercise but no increase in LE symptoms. Pt's work load in the water increased as well as some trying to generate more current to increase resistance in her LE.    Personal Factors and Comorbidities Comorbidity 3+    Comorbidities colon polyps, anxiety/depression, GERD, dysuria, hysterectomy, cholecystectomy, incisional hernia    Examination-Activity Limitations Continence;Toileting    Examination-Participation Restrictions Community Activity    Stability/Clinical Decision Making Evolving/Moderate complexity    Rehab Potential Excellent    PT Frequency 1x / week    PT Duration 12 weeks    PT Treatment/Interventions ADLs/Self Care Home Management;Biofeedback;Cryotherapy;Software engineer;Therapeutic activities;Therapeutic exercise;Neuromuscular re-education;Patient/family education;Manual techniques;Taping;Dry needling;Passive range of motion    PT Next Visit Plan aquatics to continue working on core and postural strength; pelvic to work on improved muscle length and breathing and bulging pelvic floor muscles for improved BMs    PT Home Exercise Plan Access Code: [email or web address]    Consulted and Agree with Plan of Care Patient             Patient will benefit from skilled therapeutic intervention in order to improve the following deficits and impairments:  Increased muscle spasms, Pain, Postural dysfunction, Decreased endurance, Decreased coordination, Decreased strength  Visit Diagnosis: Cramp and spasm  Chronic low back pain, unspecified back pain laterality, unspecified whether sciatica present  Muscle weakness (generalized)  Unspecified lack of coordination     Problem List Patient Active Problem List   Diagnosis Date Noted   Familial hypercholesterolemia 12/31/2020   Essential hypertension 12/31/2020    Morbid obesity ([place]) 12/31/2020   Prediabetes 12/31/2020   COVID-19 12/29/2020   Fibromyalgia 12/20/2020   Gastric bypass status for obesity 08/11/2020   Chronic idiopathic constipation 07/15/2020   Severe chronic obstructive pulmonary disease ([employer]) 05/26/2020   Mild CAD 05/13/2020   Anal pain 04/12/2020   Dysphonia 04/12/2020   Epigastric pain 04/12/2020   Family history of malignant neoplasm of gastrointestinal tract 04/12/2020   Left lower quadrant pain 04/12/2020   Pure hypercholesterolemia 04/12/2020   Thoracic and lumbosacral neuritis 04/12/2020   Vocal fold nodules    Thyroid disease    Hypertension    GERD (gastroesophageal reflux disease)    Fibroid, uterine    Depressive disorder    Chronic rhinosinusitis    Anxiety    Hemorrhoids 02/22/2020   Statin intolerance 12/28/2019   Elevated CK 11/12/2019   Lymphadenopathy 11/12/2019   Neck pain 11/04/2019   Cervical radiculopathy 05/19/2019   Numbness and tingling 04/17/2019   Tachycardia 16/01/9603   Umbilical hernia 54/12/8117   Sun-damaged skin 01/19/2019   Preventative health care 01/19/2019   Right arm pain 01/16/2019   Carpal tunnel syndrome of right wrist 09/04/2018   Entrapment of right ulnar nerve 09/04/2018   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/26/2018   Bilateral hand pain 01/30/2018   Mucoid cyst, joint 01/30/2018   Nodule of finger of both hands 01/10/2018   Weakness 07/03/2017   Osteoarthritis of spine with radiculopathy, cervical region 07/24/2016   Dysuria 05/25/2016   Epistaxis 05/25/2016   Arthritis 05/25/2016   Hypersomnia with sleep apnea 02/18/2016   Obesity (BMI 30.0-34.9) 02/18/2016   Thyroid activity decreased 07/30/2015   Left shoulder pain 06/27/2015   Pain in joint, shoulder region 06/27/2015   Myalgia 03/27/2015   Muscle cramp 05/22/2014   Mass of soft tissue of neck 02/23/2014   AP (abdominal  pain) 02/15/2014   Raynaud disease 06/16/2013   Hoarseness of voice 05/11/2013    Backache 03/03/2013   Low back pain 03/03/2013   Hypokalemia 02/05/2013   Edema 01/06/2013   Diabetes mellitus type 2 in obese ([place]) 01/06/2013   Chronic pain syndrome 01/06/2013   Cough 01/06/2013   Diabetes ([place]) 01/06/2013   Perimenopause 10/05/2012   Glaucoma    SOB (shortness of breath) 09/10/2011   Slow transit constipation 02/13/2011   Vitamin D deficiency 02/13/2011   IBS (irritable bowel syndrome) 02/13/2011   RHINITIS, CHRONIC 01/14/2010   Atypical chest pain 06/16/2008   THYROMEGALY 11/11/2007   Allergic rhinitis 11/11/2007   ECZEMA, HANDS 07/22/2007   Hypothyroidism 06/06/2007   Hyperlipemia, mixed 06/06/2007   ADJ DISORDER WITH MIXED ANXIETY & DEPRESSED MOOD 06/06/2007   OSA (obstructive sleep apnea) 06/06/2007   COLONIC POLYPS, HX OF 06/06/2007    [name], PTA 01/12/2021, 11:18 AM  [facility] [address], [place], [place], [address] Phone: [phone]   Fax:  [phone]  Name: [name] MRN: [id number] Date of Birth: Feb 13, 1961

## 2021-01-13 ENCOUNTER — Other Ambulatory Visit (HOSPITAL_COMMUNITY): Payer: Self-pay

## 2021-01-13 MED ORDER — LUBIPROSTONE 24 MCG PO CAPS
24.0000 ug | ORAL_CAPSULE | ORAL | 0 refills | Status: DC
  Filled 2021-01-13: qty 60, 30d supply, fill #0

## 2021-01-17 DIAGNOSIS — M5032 Other cervical disc degeneration, mid-cervical region, unspecified level: Secondary | ICD-10-CM | POA: Diagnosis not present

## 2021-01-17 DIAGNOSIS — M9905 Segmental and somatic dysfunction of pelvic region: Secondary | ICD-10-CM | POA: Diagnosis not present

## 2021-01-17 DIAGNOSIS — M5442 Lumbago with sciatica, left side: Secondary | ICD-10-CM | POA: Diagnosis not present

## 2021-01-17 DIAGNOSIS — M5417 Radiculopathy, lumbosacral region: Secondary | ICD-10-CM | POA: Diagnosis not present

## 2021-01-17 DIAGNOSIS — M5137 Other intervertebral disc degeneration, lumbosacral region: Secondary | ICD-10-CM | POA: Diagnosis not present

## 2021-01-17 DIAGNOSIS — M9903 Segmental and somatic dysfunction of lumbar region: Secondary | ICD-10-CM | POA: Diagnosis not present

## 2021-01-17 DIAGNOSIS — M9902 Segmental and somatic dysfunction of thoracic region: Secondary | ICD-10-CM | POA: Diagnosis not present

## 2021-01-17 DIAGNOSIS — M62830 Muscle spasm of back: Secondary | ICD-10-CM | POA: Diagnosis not present

## 2021-01-17 DIAGNOSIS — M9901 Segmental and somatic dysfunction of cervical region: Secondary | ICD-10-CM | POA: Diagnosis not present

## 2021-01-18 ENCOUNTER — Ambulatory Visit: Payer: 59 | Admitting: Physical Therapy

## 2021-01-18 ENCOUNTER — Encounter: Payer: 59 | Admitting: Physical Therapy

## 2021-01-18 ENCOUNTER — Telehealth: Payer: Self-pay | Admitting: *Deleted

## 2021-01-18 ENCOUNTER — Encounter: Payer: Self-pay | Admitting: Physical Therapy

## 2021-01-18 ENCOUNTER — Other Ambulatory Visit: Payer: Self-pay

## 2021-01-18 ENCOUNTER — Encounter: Payer: Self-pay | Admitting: Family Medicine

## 2021-01-18 ENCOUNTER — Other Ambulatory Visit (HOSPITAL_COMMUNITY): Payer: Self-pay

## 2021-01-18 DIAGNOSIS — R252 Cramp and spasm: Secondary | ICD-10-CM

## 2021-01-18 DIAGNOSIS — R279 Unspecified lack of coordination: Secondary | ICD-10-CM

## 2021-01-18 DIAGNOSIS — M545 Low back pain, unspecified: Secondary | ICD-10-CM

## 2021-01-18 DIAGNOSIS — G8929 Other chronic pain: Secondary | ICD-10-CM | POA: Diagnosis not present

## 2021-01-18 DIAGNOSIS — M6281 Muscle weakness (generalized): Secondary | ICD-10-CM

## 2021-01-18 NOTE — Therapy (Signed)
[facility] [address] [place], [place], [address] Phone: [phone]   Fax:  [phone]  Physical Therapy Treatment  Patient Details  Name: [name] MRN: [id number] Date of Birth: 08-May-1960 Referring Provider (PT): Vladimir Crofts, [place]   Encounter Date: 01/18/2021   PT End of Session - 01/18/21 0934     Visit Number 12    Date for PT Re-Evaluation 03/01/21    Authorization Type [employer] employee    PT Start Time 0930    PT Stop Time 1010    PT Time Calculation (min) 40 min    Activity Tolerance Patient tolerated treatment well    Behavior During Therapy [facility] for tasks assessed/performed             Past Medical History:  Diagnosis Date   Acute bronchitis 05/25/2016   Anxiety    Arthritis    Chronic pain syndrome 01/06/2013   Chronic rhinosinusitis    Colon polyps    Cough 01/06/2013   Depression    Diabetes ([place]) 01/06/2013   Diabetes mellitus type 2 in obese [facility]) 01/06/2013   Sees Dr Calvert Cantor for eye exam Does not see podiatry, foot exam today unremarkable except for thick cracking skin on heals  pt denies    Dyspnea    with exertion    Dysuria 05/25/2016   Edema 01/06/2013   Epistaxis 05/25/2016   Fibroid, uterine    Fibromyalgia    GERD (gastroesophageal reflux disease)    Glaucoma 12-13   Hoarseness    Hoarseness of voice 05/11/2013   Hyperglycemia 04/13/2013   Hyperlipemia, mixed 06/06/2007   Qualifier: Diagnosis of  By: Wynona Luna She feels Lipitor caused increased low back pain and weakness     Hyperlipidemia    Hypertension    Hypokalemia 02/05/2013   IBS (irritable bowel syndrome) 02/13/2011   Low back pain 03/03/2013   Muscle cramp 05/22/2014   Obesity    Obesity, unspecified 05/11/2013   OSA (obstructive sleep apnea)    cpap    Pain in joint, shoulder region 06/27/2015   Perimenopause 10/05/2012   Pre-diabetes    Raynaud disease 06/16/2013   Thyroid disease     hypothyroidism   Vocal fold nodules     Past Surgical History:  Procedure Laterality Date   ABDOMINAL HYSTERECTOMY     BREAST EXCISIONAL BIOPSY Right    at age 19   CHOLECYSTECTOMY     colonoscopoy      [place]     x2   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   Procedure: LAPAROSCOPIC ROUX-EN-Y GASTRIC BYPASS WITH UPPER ENDOSCOPY;  Surgeon: [name], MD;  Location: [facility];  Service: General;  Laterality: N/A;   I & D EXTREMITY Left 04/11/2018   Procedure: DEBIDMENT DISTAL INTERPHALANGEAL LEFT MIDDLE;  Surgeon: Daryll Brod, MD;  Location: [place];  Service: Orthopedics;  Laterality: Left;   INCISIONAL HERNIA REPAIR  08/10/2020   Procedure: LAPAROSCOPIC PRIMARY REPAIR OF INCISIONAL HERNIA;  Surgeon: [name], MD;  Location: [facility];  Service: General;;   MASS EXCISION Left 04/11/2018   Procedure: EXCISION MASS;  Surgeon: Daryll Brod, MD;  Location: [place];  Service: Orthopedics;  Laterality: Left;   OOPHORECTOMY     POLYPECTOMY     ROTATOR CUFF REPAIR     UPPER GI ENDOSCOPY N/A 08/10/2020   Procedure: UPPER GI ENDOSCOPY;  Surgeon: [name], MD;  Location: [facility];  Service: General;  Laterality: N/A;    There were no vitals filed for this visit.   Subjective Assessment - 01/18/21 1021     Subjective Just a tine bit of tingling in my leg.  BMs are better currently 3x per week    Pertinent History colon polyps, anxiety/depression, GERD, dysuria, hysterectomy, cholecystectomy, incisional hernia repair, gastric bypass surgery    Patient Stated Goals Be able to have BM    Currently in Pain? No/denies                               [facility] Adult PT Treatment/Exercise - 01/18/21 0001       Neuro Re-ed    Neuro Re-ed Details  contract relax and working on contract and hold - discussed adding endurance to HEP      Manual Therapy   Manual Therapy Internal Pelvic Floor    Manual therapy  comments pt identity confirmed and internal soft tissue treatment with consent    Myofascial Release bilateral gluteals and lumbar paraspinlas    Internal Pelvic Floor sphincter muscle and puborectalis stretch with contract/relax                       PT Short Term Goals - 12/17/20 1114       PT SHORT TERM GOAL #1   Title able to bulge pelvic floor    Status Achieved      PT SHORT TERM GOAL #2   Title pt will report at least 20% less straining    Baseline had BM every other day this week without straining    Status Achieved               PT Long Term Goals - 01/18/21 0935       PT LONG TERM GOAL #1   Title Pt will report BM at least every 3 days without straining or bearing down    Baseline I have one every other day except on Sat-Sun when I am working      PT [place] #2   Title Pt will improve lumbar soft tissue mobility for improved low back pain    Status Achieved      PT LONG TERM GOAL #3   Title Pt will imporve pelvic floor strength to at least 3/5 and holding for 10 seconds due to improved coordination without breath holding    Baseline 3/5 unable to hold 10 seconds due to high tone    Status On-going      PT LONG TERM GOAL #4   Title Pt will be ind with HEP to maintain improvements and functional level    Status On-going                   Plan - 01/18/21 1008     Clinical Impression Statement Pt has been feeling better with just a small amount of numbness and tingling.  Pt stil benefits from treatment to release muscle tension.  Pt was able to get imporved bulge at the end of the session and after doing contract relax stretches.  Pt needed verbal cues as well to relax sphincter muscles when bulging.  Continue skilled PT to continue to work on core strength in the pool and continue with releasing muscles for improved BMs    PT Treatment/Interventions ADLs/Self Care Home Management;Biofeedback;Cryotherapy;Electrical Stimulation;Moist  Heat;Gait training;Therapeutic activities;Therapeutic exercise;Neuromuscular re-education;Patient/family education;Manual techniques;Taping;Dry needling;Passive range of motion    PT Next Visit Plan aquatics to continue working on core and postural strength; pelvic to work on improved muscle length and breathing and bulging pelvic floor muscles for improved BMs    PT Home Exercise Plan Access Code: [email or web address]    Consulted and Agree with Plan of Care Patient             Patient will benefit from skilled therapeutic intervention in order to improve the following deficits and impairments:  Increased muscle spasms, Pain, Postural dysfunction, Decreased endurance, Decreased coordination, Decreased strength  Visit Diagnosis: Cramp and spasm  Chronic low back pain, unspecified back pain laterality, unspecified whether sciatica present  Muscle weakness (generalized)  Unspecified lack of coordination     Problem List Patient Active Problem List   Diagnosis Date Noted   Familial hypercholesterolemia 12/31/2020   Essential hypertension 12/31/2020   Morbid obesity ([place]) 12/31/2020   Prediabetes 12/31/2020   COVID-19 12/29/2020   Fibromyalgia 12/20/2020   Gastric bypass status for obesity 08/11/2020   Chronic idiopathic constipation 07/15/2020   Severe chronic obstructive pulmonary disease ([place]) 05/26/2020   Mild CAD 05/13/2020   Anal pain 04/12/2020   Dysphonia 04/12/2020   Epigastric pain 04/12/2020   Family history of malignant neoplasm of gastrointestinal tract 04/12/2020   Left lower quadrant pain 04/12/2020   Pure hypercholesterolemia 04/12/2020   Thoracic and lumbosacral neuritis 04/12/2020   Vocal fold nodules    Thyroid disease    Hypertension    GERD (gastroesophageal reflux disease)    Fibroid, uterine    Depressive disorder    Chronic rhinosinusitis    Anxiety    Hemorrhoids 02/22/2020   Statin intolerance 12/28/2019   Elevated CK 11/12/2019   Lymphadenopathy  11/12/2019   Neck pain 11/04/2019   Cervical radiculopathy 05/19/2019   Numbness and tingling 04/17/2019   Tachycardia 22/48/2500   Umbilical hernia 37/07/8887   Sun-damaged skin 01/19/2019   Preventative health care 01/19/2019   Right arm pain 01/16/2019   Carpal tunnel syndrome of right wrist 09/04/2018   Entrapment of right ulnar nerve 09/04/2018   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/26/2018   Bilateral hand pain 01/30/2018   Mucoid cyst, joint 01/30/2018   Nodule of finger of both hands 01/10/2018   Weakness 07/03/2017   Osteoarthritis of spine with radiculopathy, cervical region 07/24/2016   Dysuria 05/25/2016   Epistaxis 05/25/2016   Arthritis 05/25/2016   Hypersomnia with sleep apnea 02/18/2016   Obesity (BMI 30.0-34.9) 02/18/2016   Thyroid activity decreased 07/30/2015   Left shoulder pain 06/27/2015   Pain in joint, shoulder region 06/27/2015   Myalgia 03/27/2015   Muscle cramp 05/22/2014   Mass of soft tissue of neck 02/23/2014   AP (abdominal pain) 02/15/2014   Raynaud disease 06/16/2013   Hoarseness of voice 05/11/2013   Backache 03/03/2013   Low back pain 03/03/2013   Hypokalemia 02/05/2013   Edema 01/06/2013   Diabetes mellitus type 2 in obese ([place]) 01/06/2013   Chronic pain syndrome 01/06/2013   Cough 01/06/2013   Diabetes ([place]) 01/06/2013   Perimenopause 10/05/2012   Glaucoma    SOB (shortness of breath) 09/10/2011   Slow transit constipation 02/13/2011   Vitamin D deficiency 02/13/2011   IBS (irritable bowel syndrome) 02/13/2011   RHINITIS, CHRONIC 01/14/2010   Atypical chest pain 06/16/2008   THYROMEGALY 11/11/2007   Allergic rhinitis 11/11/2007   ECZEMA, HANDS 07/22/2007   Hypothyroidism 06/06/2007  Hyperlipemia, mixed 06/06/2007   ADJ DISORDER WITH MIXED ANXIETY & DEPRESSED MOOD 06/06/2007   OSA (obstructive sleep apnea) 06/06/2007   COLONIC POLYPS, HX OF 06/06/2007    Jule Ser, PT 01/18/2021, 10:26 AM  [facility] [address], [place], [place], [address] Phone: [phone]   Fax:  [phone]  Name: [name] MRN: [id number] Date of Birth: August 15, 1960

## 2021-01-18 NOTE — Telephone Encounter (Signed)
Error

## 2021-01-19 ENCOUNTER — Ambulatory Visit: Payer: 59 | Admitting: Physical Therapy

## 2021-01-19 ENCOUNTER — Other Ambulatory Visit (HOSPITAL_COMMUNITY): Payer: Self-pay

## 2021-01-19 ENCOUNTER — Encounter: Payer: Self-pay | Admitting: Physical Therapy

## 2021-01-19 DIAGNOSIS — M6281 Muscle weakness (generalized): Secondary | ICD-10-CM | POA: Diagnosis not present

## 2021-01-19 DIAGNOSIS — G8929 Other chronic pain: Secondary | ICD-10-CM

## 2021-01-19 DIAGNOSIS — M545 Low back pain, unspecified: Secondary | ICD-10-CM | POA: Diagnosis not present

## 2021-01-19 DIAGNOSIS — R279 Unspecified lack of coordination: Secondary | ICD-10-CM | POA: Diagnosis not present

## 2021-01-19 DIAGNOSIS — R252 Cramp and spasm: Secondary | ICD-10-CM

## 2021-01-19 NOTE — Therapy (Signed)
[facility] [address] [place], [place], [address] Phone: [phone]   Fax:  [phone]  Physical Therapy Treatment  Patient Details  Name: [name] MRN: [id number] Date of Birth: 04-13-1961 Referring Provider (PT): Vladimir Crofts, [place]   Encounter Date: 01/19/2021   PT End of Session - 01/19/21 1325     Visit Number 13    Date for PT Re-Evaluation 03/01/21    Authorization Type [employer] employee    PT Start Time 1135    PT Stop Time 6629    PT Time Calculation (min) 45 min    Activity Tolerance Patient tolerated treatment well    Behavior During Therapy [facility] for tasks assessed/performed             Past Medical History:  Diagnosis Date   Acute bronchitis 05/25/2016   Anxiety    Arthritis    Chronic pain syndrome 01/06/2013   Chronic rhinosinusitis    Colon polyps    Cough 01/06/2013   Depression    Diabetes ([place]) 01/06/2013   Diabetes mellitus type 2 in obese [facility]) 01/06/2013   Sees Dr Calvert Cantor for eye exam Does not see podiatry, foot exam today unremarkable except for thick cracking skin on heals  pt denies    Dyspnea    with exertion    Dysuria 05/25/2016   Edema 01/06/2013   Epistaxis 05/25/2016   Fibroid, uterine    Fibromyalgia    GERD (gastroesophageal reflux disease)    Glaucoma 12-13   Hoarseness    Hoarseness of voice 05/11/2013   Hyperglycemia 04/13/2013   Hyperlipemia, mixed 06/06/2007   Qualifier: Diagnosis of  By: Wynona Luna She feels Lipitor caused increased low back pain and weakness     Hyperlipidemia    Hypertension    Hypokalemia 02/05/2013   IBS (irritable bowel syndrome) 02/13/2011   Low back pain 03/03/2013   Muscle cramp 05/22/2014   Obesity    Obesity, unspecified 05/11/2013   OSA (obstructive sleep apnea)    cpap    Pain in joint, shoulder region 06/27/2015   Perimenopause 10/05/2012   Pre-diabetes    Raynaud disease 06/16/2013   Thyroid disease     hypothyroidism   Vocal fold nodules     Past Surgical History:  Procedure Laterality Date   ABDOMINAL HYSTERECTOMY     BREAST EXCISIONAL BIOPSY Right    at age 54   CHOLECYSTECTOMY     colonoscopoy      [place]     x2   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   Procedure: LAPAROSCOPIC ROUX-EN-Y GASTRIC BYPASS WITH UPPER ENDOSCOPY;  Surgeon: [name], MD;  [facility];  Service: General;  Laterality: N/A;   I & D EXTREMITY Left 04/11/2018   Procedure: DEBIDMENT DISTAL INTERPHALANGEAL LEFT MIDDLE;  Surgeon: Daryll Brod, MD;  Location: [place];  Service: Orthopedics;  Laterality: Left;   INCISIONAL HERNIA REPAIR  08/10/2020   Procedure: LAPAROSCOPIC PRIMARY REPAIR OF INCISIONAL HERNIA;  Surgeon: [name], MD;  Location: [facility];  Service: General;;   MASS EXCISION Left 04/11/2018   Procedure: EXCISION MASS;  Surgeon: Daryll Brod, MD;  Location: [place];  Service: Orthopedics;  Laterality: Left;   OOPHORECTOMY     POLYPECTOMY     ROTATOR CUFF REPAIR     UPPER GI ENDOSCOPY N/A 08/10/2020   Procedure: UPPER GI ENDOSCOPY;  Surgeon: [name], MD;  [facility];  Service: General;  Laterality: N/A;    There were no vitals filed for this visit.   Subjective Assessment - 01/19/21 1324     Subjective "DOing much better." My Lt leg feeling and doing much better.    Pertinent History colon polyps, anxiety/depression, GERD, dysuria, hysterectomy, cholecystectomy, incisional hernia repair, gastric bypass surgery    Currently in Pain? No/denies    Multiple Pain Sites No              Treatment: Patient seen for aquatic therapy today.  Treatment took place in water 2.5-4 feet deep depending upon activity.  Pt entered the pool via stairs with minor use of rails, step to step. Pt requires buoyancy of water for support and to offload joints with strengthening exercises.  Water temp 92 degrees F.  Seated  water bench with 75% submersion Pt performed seated LE AROM exercises 20x in all planes, abdominal compressions  with small noodle 10x,   Water walking 75% depth: 10x in each direction, VC to increase speed. Pt could do well. High knee marching with emphasis on core/balance with small noodle 6 lenghts of pool. Quad stretches 2x 30 sec bil, Single leg balance LTLE 15 sec 3x, no UE support. Wall exercises 15x bil hip abd, f;ex/ext, heel raises, squats no UE support to 1-2 finger support. Underwater bicycle 1 min, 1 min rest 3 sets. Seated decompression with large noodle behind patient. 2 min                            PT Short Term Goals - 12/17/20 1114       PT SHORT TERM GOAL #1   Title able to bulge pelvic floor    Status Achieved      PT SHORT TERM GOAL #2   Title pt will report at least 20% less straining    Baseline had BM every other day this week without straining    Status Achieved               PT Long Term Goals - 01/18/21 0935       PT LONG TERM GOAL #1   Title Pt will report BM at least every 3 days without straining or bearing down    Baseline I have one every other day except on Sat-Sun when I am working      PT LONG TERM GOAL #2   Title Pt will improve lumbar soft tissue mobility for improved low back pain    Status Achieved      PT LONG TERM GOAL #3   Title Pt will imporve pelvic floor strength to at least 3/5 and holding for 10 seconds due to improved coordination without breath holding    Baseline 3/5 unable to hold 10 seconds due to high tone    Status On-going      PT LONG TERM GOAL #4   Title Pt will be ind with HEP to maintain improvements and functional level    Status On-going                   Plan - 01/19/21 1325     Clinical Impression Statement Pt arrives pain free to aquatic PT. Pt is able to increase her speed on all exercises which in turn increases the resistance in her session. Standing on LTLE is  difficult for pt to do. No pain throughout  session.    Comorbidities colon polyps, anxiety/depression, GERD, dysuria, hysterectomy, cholecystectomy, incisional hernia    Examination-Activity Limitations Continence;Toileting    Examination-Participation Restrictions Community Activity    Stability/Clinical Decision Making Evolving/Moderate complexity    Rehab Potential Excellent    PT Frequency 1x / week    PT Duration 12 weeks    PT Treatment/Interventions ADLs/Self Care Home Management;Biofeedback;Cryotherapy;Software engineer;Therapeutic activities;Therapeutic exercise;Neuromuscular re-education;Patient/family education;Manual techniques;Taping;Dry needling;Passive range of motion    PT Next Visit Plan aquatics to continue working on core and postural strength; pelvic to work on improved muscle length and breathing and bulging pelvic floor muscles for improved BMs    PT Home Exercise Plan Access Code: [email or web address]    Consulted and Agree with Plan of Care Patient             Patient will benefit from skilled therapeutic intervention in order to improve the following deficits and impairments:  Increased muscle spasms, Pain, Postural dysfunction, Decreased endurance, Decreased coordination, Decreased strength  Visit Diagnosis: Cramp and spasm  Chronic low back pain, unspecified back pain laterality, unspecified whether sciatica present  Muscle weakness (generalized)  Unspecified lack of coordination     Problem List Patient Active Problem List   Diagnosis Date Noted   Familial hypercholesterolemia 12/31/2020   Essential hypertension 12/31/2020   Morbid obesity ([place]) 12/31/2020   Prediabetes 12/31/2020   COVID-19 12/29/2020   Fibromyalgia 12/20/2020   Gastric bypass status for obesity 08/11/2020   Chronic idiopathic constipation 07/15/2020   Severe chronic obstructive pulmonary disease ([place]) 05/26/2020   Mild CAD 05/13/2020   Anal pain 04/12/2020    Dysphonia 04/12/2020   Epigastric pain 04/12/2020   Family history of malignant neoplasm of gastrointestinal tract 04/12/2020   Left lower quadrant pain 04/12/2020   Pure hypercholesterolemia 04/12/2020   Thoracic and lumbosacral neuritis 04/12/2020   Vocal fold nodules    Thyroid disease    Hypertension    GERD (gastroesophageal reflux disease)    Fibroid, uterine    Depressive disorder    Chronic rhinosinusitis    Anxiety    Hemorrhoids 02/22/2020   Statin intolerance 12/28/2019   Elevated CK 11/12/2019   Lymphadenopathy 11/12/2019   Neck pain 11/04/2019   Cervical radiculopathy 05/19/2019   Numbness and tingling 04/17/2019   Tachycardia 37/62/8315   Umbilical hernia 17/61/6073   Sun-damaged skin 01/19/2019   Preventative health care 01/19/2019   Right arm pain 01/16/2019   Carpal tunnel syndrome of right wrist 09/04/2018   Entrapment of right ulnar nerve 09/04/2018   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/26/2018   Bilateral hand pain 01/30/2018   Mucoid cyst, joint 01/30/2018   Nodule of finger of both hands 01/10/2018   Weakness 07/03/2017   Osteoarthritis of spine with radiculopathy, cervical region 07/24/2016   Dysuria 05/25/2016   Epistaxis 05/25/2016   Arthritis 05/25/2016   Hypersomnia with sleep apnea 02/18/2016   Obesity (BMI 30.0-34.9) 02/18/2016   Thyroid activity decreased 07/30/2015   Left shoulder pain 06/27/2015   Pain in joint, shoulder region 06/27/2015   Myalgia 03/27/2015   Muscle cramp 05/22/2014   Mass of soft tissue of neck 02/23/2014   AP (abdominal pain) 02/15/2014   Raynaud disease 06/16/2013   Hoarseness of voice 05/11/2013   Backache 03/03/2013   Low back pain 03/03/2013   Hypokalemia 02/05/2013   Edema 01/06/2013   Diabetes mellitus type 2 in obese ([place]) 01/06/2013   Chronic pain syndrome 01/06/2013   Cough 01/06/2013   Diabetes ([place])  01/06/2013   Perimenopause 10/05/2012   Glaucoma    SOB (shortness of breath) 09/10/2011    Slow transit constipation 02/13/2011   Vitamin D deficiency 02/13/2011   IBS (irritable bowel syndrome) 02/13/2011   RHINITIS, CHRONIC 01/14/2010   Atypical chest pain 06/16/2008   THYROMEGALY 11/11/2007   Allergic rhinitis 11/11/2007   ECZEMA, HANDS 07/22/2007   Hypothyroidism 06/06/2007   Hyperlipemia, mixed 06/06/2007   ADJ DISORDER WITH MIXED ANXIETY & DEPRESSED MOOD 06/06/2007   OSA (obstructive sleep apnea) 06/06/2007   COLONIC POLYPS, HX OF 06/06/2007    Brailon Don, PTA 01/19/2021, 1:29 PM  [facility] [address], [place], [place], [address] Phone: [phone]   Fax:  [phone]  Name: [name] MRN: [id number] Date of Birth: 1961-03-27

## 2021-01-20 ENCOUNTER — Other Ambulatory Visit: Payer: Self-pay

## 2021-01-20 DIAGNOSIS — I7300 Raynaud's syndrome without gangrene: Secondary | ICD-10-CM

## 2021-01-20 DIAGNOSIS — I10 Essential (primary) hypertension: Secondary | ICD-10-CM | POA: Diagnosis not present

## 2021-01-20 DIAGNOSIS — G4733 Obstructive sleep apnea (adult) (pediatric): Secondary | ICD-10-CM | POA: Diagnosis not present

## 2021-01-24 DIAGNOSIS — M5032 Other cervical disc degeneration, mid-cervical region, unspecified level: Secondary | ICD-10-CM | POA: Diagnosis not present

## 2021-01-24 DIAGNOSIS — M5442 Lumbago with sciatica, left side: Secondary | ICD-10-CM | POA: Diagnosis not present

## 2021-01-24 DIAGNOSIS — M9903 Segmental and somatic dysfunction of lumbar region: Secondary | ICD-10-CM | POA: Diagnosis not present

## 2021-01-24 DIAGNOSIS — M5417 Radiculopathy, lumbosacral region: Secondary | ICD-10-CM | POA: Diagnosis not present

## 2021-01-24 DIAGNOSIS — M5137 Other intervertebral disc degeneration, lumbosacral region: Secondary | ICD-10-CM | POA: Diagnosis not present

## 2021-01-24 DIAGNOSIS — G4733 Obstructive sleep apnea (adult) (pediatric): Secondary | ICD-10-CM | POA: Diagnosis not present

## 2021-01-24 DIAGNOSIS — M62830 Muscle spasm of back: Secondary | ICD-10-CM | POA: Diagnosis not present

## 2021-01-24 DIAGNOSIS — M9901 Segmental and somatic dysfunction of cervical region: Secondary | ICD-10-CM | POA: Diagnosis not present

## 2021-01-24 DIAGNOSIS — M9902 Segmental and somatic dysfunction of thoracic region: Secondary | ICD-10-CM | POA: Diagnosis not present

## 2021-01-24 DIAGNOSIS — I10 Essential (primary) hypertension: Secondary | ICD-10-CM | POA: Diagnosis not present

## 2021-01-24 DIAGNOSIS — M9905 Segmental and somatic dysfunction of pelvic region: Secondary | ICD-10-CM | POA: Diagnosis not present

## 2021-01-25 ENCOUNTER — Other Ambulatory Visit (HOSPITAL_COMMUNITY): Payer: Self-pay

## 2021-01-26 ENCOUNTER — Ambulatory Visit: Payer: 59 | Attending: Physician Assistant | Admitting: Physical Therapy

## 2021-01-26 ENCOUNTER — Other Ambulatory Visit: Payer: Self-pay

## 2021-01-26 ENCOUNTER — Encounter: Payer: Self-pay | Admitting: Physical Therapy

## 2021-01-26 DIAGNOSIS — M545 Low back pain, unspecified: Secondary | ICD-10-CM | POA: Diagnosis not present

## 2021-01-26 DIAGNOSIS — M6281 Muscle weakness (generalized): Secondary | ICD-10-CM | POA: Diagnosis not present

## 2021-01-26 DIAGNOSIS — R279 Unspecified lack of coordination: Secondary | ICD-10-CM | POA: Insufficient documentation

## 2021-01-26 DIAGNOSIS — K5904 Chronic idiopathic constipation: Secondary | ICD-10-CM | POA: Diagnosis not present

## 2021-01-26 DIAGNOSIS — G8929 Other chronic pain: Secondary | ICD-10-CM | POA: Diagnosis not present

## 2021-01-26 DIAGNOSIS — R252 Cramp and spasm: Secondary | ICD-10-CM | POA: Diagnosis not present

## 2021-01-26 NOTE — Therapy (Signed)
[facility] @ [place], [place], [address] Phone:     Fax:     Physical Therapy Treatment  Patient Details  Name: [name] MRN: [id number] Date of Birth: 08-03-1960 Referring Provider (PT): Vladimir Crofts, [place]   Encounter Date: 01/26/2021   PT End of Session - 01/26/21 1310     Visit Number 14    Date for PT Re-Evaluation 03/01/21    Authorization Type [employer] employee    PT Start Time 1135    PT Stop Time 1914    PT Time Calculation (min) 45 min    Activity Tolerance Patient tolerated treatment well    Behavior During Therapy [facility] for tasks assessed/performed             Past Medical History:  Diagnosis Date   Acute bronchitis 05/25/2016   Anxiety    Arthritis    Chronic pain syndrome 01/06/2013   Chronic rhinosinusitis    Colon polyps    Cough 01/06/2013   Depression    Diabetes ([place]) 01/06/2013   Diabetes mellitus type 2 in obese ([place]) 01/06/2013   Sees Dr Calvert Cantor for eye exam Does not see podiatry, foot exam today unremarkable except for thick cracking skin on heals  pt denies    Dyspnea    with exertion    Dysuria 05/25/2016   Edema 01/06/2013   Epistaxis 05/25/2016   Fibroid, uterine    Fibromyalgia    GERD (gastroesophageal reflux disease)    Glaucoma 12-13   Hoarseness    Hoarseness of voice 05/11/2013   Hyperglycemia 04/13/2013   Hyperlipemia, mixed 06/06/2007   Qualifier: Diagnosis of  By: Wynona Luna She feels Lipitor caused increased low back pain and weakness     Hyperlipidemia    Hypertension    Hypokalemia 02/05/2013   IBS (irritable bowel syndrome) 02/13/2011   Low back pain 03/03/2013   Muscle cramp 05/22/2014   Obesity    Obesity, unspecified 05/11/2013   OSA (obstructive sleep apnea)    cpap    Pain in joint, shoulder region 06/27/2015   Perimenopause 10/05/2012   Pre-diabetes    Raynaud disease 06/16/2013   Thyroid disease    hypothyroidism   Vocal  fold nodules     Past Surgical History:  Procedure Laterality Date   ABDOMINAL HYSTERECTOMY     BREAST EXCISIONAL BIOPSY Right    at age 60   CHOLECYSTECTOMY     colonoscopoy      [place]     x2   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   Procedure: LAPAROSCOPIC ROUX-EN-Y GASTRIC BYPASS WITH UPPER ENDOSCOPY;  Surgeon: [name], MD;  Location: [facility];  Service: General;  Laterality: N/A;   I & D EXTREMITY Left 04/11/2018   Procedure: DEBIDMENT DISTAL INTERPHALANGEAL LEFT MIDDLE;  Surgeon: Daryll Brod, MD;  Location: [facility];  Service: Orthopedics;  Laterality: Left;   INCISIONAL HERNIA REPAIR  08/10/2020   Procedure: LAPAROSCOPIC PRIMARY REPAIR OF INCISIONAL HERNIA;  Surgeon: [name], MD;  Location: [facility];  Service: General;;   MASS EXCISION Left 04/11/2018   Procedure: EXCISION MASS;  Surgeon: Daryll Brod, MD;  Location: [facility];  Service: Orthopedics;  Laterality: Left;   OOPHORECTOMY     POLYPECTOMY     ROTATOR CUFF REPAIR     UPPER GI ENDOSCOPY N/A 08/10/2020   Procedure:  UPPER GI ENDOSCOPY;  Surgeon: [name], MD;  Location: [facility];  Service: General;  Laterality: N/A;    There were no vitals filed for this visit.   Subjective Assessment - 01/26/21 1309     Subjective Doing great, no current pain.    Pertinent History colon polyps, anxiety/depression, GERD, dysuria, hysterectomy, cholecystectomy, incisional hernia repair, gastric bypass surgery    Currently in Pain? No/denies    Multiple Pain Sites No               Treatment: Patient seen for aquatic therapy today.  Treatment took place in water 2.5-4 feet deep depending upon activity.  Pt entered the pool via stairs reciprocally with mild use of hand rails. Pt requires buoyancy of water for support and to offload joints with strengthening exercises.  Water temp 94 degrees F.  Seated water bench with 75% submersion Pt performed seated  LE AROM exercises 20x in all planes, status update concurrent.  Standing in 75% depth: Water walking with yellow noodle UE push & pull, speed increasing for resistance/current. 10x forward & back, side stepping with squat and jumping jack arms with mitten hands 10x Wall exs: No UE 20x Bil: heel lifts, hip abd, marching, circumduction: Abdominal presses with squadoodle 10x 5 ec hold, added knee lift 10x, then move length of pool 4 lengths with knee lift and noodle press.   Underwater bicycle 2 min, 1 min rest 3 bouts using large noodle.                            PT Short Term Goals - 12/17/20 1114       PT SHORT TERM GOAL #1   Title able to bulge pelvic floor    Status Achieved      PT SHORT TERM GOAL #2   Title pt will report at least 20% less straining    Baseline had BM every other day this week without straining    Status Achieved               PT Long Term Goals - 01/18/21 0935       PT LONG TERM GOAL #1   Title Pt will report BM at least every 3 days without straining or bearing down    Baseline I have one every other day except on Sat-Sun when I am working      PT LONG TERM GOAL #2   Title Pt will improve lumbar soft tissue mobility for improved low back pain    Status Achieved      PT LONG TERM GOAL #3   Title Pt will imporve pelvic floor strength to at least 3/5 and holding for 10 seconds due to improved coordination without breath holding    Baseline 3/5 unable to hold 10 seconds due to high tone    Status On-going      PT LONG TERM GOAL #4   Title Pt will be ind with HEP to maintain improvements and functional level    Status On-going                   Plan - 01/26/21 1310     Clinical Impression Statement Pt reports feeling great, occasional work stress. Pt responded well to the increase in speed/resistance in the pool. Pt can now perform all LE exercises without holding on and canmaintain her balance due to the  strengthening of her LE specifically her hips.  No pain exercising in the water.    Personal Factors and Comorbidities Comorbidity 3+    Comorbidities colon polyps, anxiety/depression, GERD, dysuria, hysterectomy, cholecystectomy, incisional hernia    Examination-Activity Limitations Continence;Toileting    Examination-Participation Restrictions Community Activity    Stability/Clinical Decision Making Evolving/Moderate complexity    Rehab Potential Excellent    PT Duration 12 weeks    PT Treatment/Interventions ADLs/Self Care Home Management;Biofeedback;Cryotherapy;Software engineer;Therapeutic activities;Therapeutic exercise;Neuromuscular re-education;Patient/family education;Manual techniques;Taping;Dry needling;Passive range of motion    PT Next Visit Plan aquatics to continue working on core and postural strength; pelvic to work on improved muscle length and breathing and bulging pelvic floor muscles for improved BMs    PT Home Exercise Plan Access Code: [email or web address]    Consulted and Agree with Plan of Care Patient             Patient will benefit from skilled therapeutic intervention in order to improve the following deficits and impairments:  Increased muscle spasms, Pain, Postural dysfunction, Decreased endurance, Decreased coordination, Decreased strength  Visit Diagnosis: Cramp and spasm  Chronic low back pain, unspecified back pain laterality, unspecified whether sciatica present  Unspecified lack of coordination     Problem List Patient Active Problem List   Diagnosis Date Noted   Familial hypercholesterolemia 12/31/2020   Essential hypertension 12/31/2020   Morbid obesity ([place]) 12/31/2020   Prediabetes 12/31/2020   COVID-19 12/29/2020   Fibromyalgia 12/20/2020   Gastric bypass status for obesity 08/11/2020   Chronic idiopathic constipation 07/15/2020   Severe chronic obstructive pulmonary disease ([place]) 05/26/2020   Mild CAD 05/13/2020    Anal pain 04/12/2020   Dysphonia 04/12/2020   Epigastric pain 04/12/2020   Family history of malignant neoplasm of gastrointestinal tract 04/12/2020   Left lower quadrant pain 04/12/2020   Pure hypercholesterolemia 04/12/2020   Thoracic and lumbosacral neuritis 04/12/2020   Vocal fold nodules    Thyroid disease    Hypertension    GERD (gastroesophageal reflux disease)    Fibroid, uterine    Depressive disorder    Chronic rhinosinusitis    Anxiety    Hemorrhoids 02/22/2020   Statin intolerance 12/28/2019   Elevated CK 11/12/2019   Lymphadenopathy 11/12/2019   Neck pain 11/04/2019   Cervical radiculopathy 05/19/2019   Numbness and tingling 04/17/2019   Tachycardia 62/26/3335   Umbilical hernia 45/62/5638   Sun-damaged skin 01/19/2019   Preventative health care 01/19/2019   Right arm pain 01/16/2019   Carpal tunnel syndrome of right wrist 09/04/2018   Entrapment of right ulnar nerve 09/04/2018   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/26/2018   Bilateral hand pain 01/30/2018   Mucoid cyst, joint 01/30/2018   Nodule of finger of both hands 01/10/2018   Weakness 07/03/2017   Osteoarthritis of spine with radiculopathy, cervical region 07/24/2016   Dysuria 05/25/2016   Epistaxis 05/25/2016   Arthritis 05/25/2016   Hypersomnia with sleep apnea 02/18/2016   Obesity (BMI 30.0-34.9) 02/18/2016   Thyroid activity decreased 07/30/2015   Left shoulder pain 06/27/2015   Pain in joint, shoulder region 06/27/2015   Myalgia 03/27/2015   Muscle cramp 05/22/2014   Mass of soft tissue of neck 02/23/2014   AP (abdominal pain) 02/15/2014   Raynaud disease 06/16/2013   Hoarseness of voice 05/11/2013   Backache 03/03/2013   Low back pain 03/03/2013   Hypokalemia 02/05/2013   Edema 01/06/2013   Diabetes mellitus type 2 in obese ([place]) 01/06/2013   Chronic pain syndrome 01/06/2013   Cough 01/06/2013  Diabetes ([place]) 01/06/2013   Perimenopause 10/05/2012   Glaucoma    SOB (shortness of  breath) 09/10/2011   Slow transit constipation 02/13/2011   Vitamin D deficiency 02/13/2011   IBS (irritable bowel syndrome) 02/13/2011   RHINITIS, CHRONIC 01/14/2010   Atypical chest pain 06/16/2008   THYROMEGALY 11/11/2007   Allergic rhinitis 11/11/2007   ECZEMA, HANDS 07/22/2007   Hypothyroidism 06/06/2007   Hyperlipemia, mixed 06/06/2007   ADJ DISORDER WITH MIXED ANXIETY & DEPRESSED MOOD 06/06/2007   OSA (obstructive sleep apnea) 06/06/2007   COLONIC POLYPS, HX OF 06/06/2007    Jessamyn Watterson, PTA 01/26/2021, 1:14 PM  [facility] @ [place], [place], [address] Phone:     Fax:     Name: MISSY BAKSH MRN: [id number] Date of Birth: [birth year]

## 2021-01-28 ENCOUNTER — Other Ambulatory Visit (HOSPITAL_COMMUNITY): Payer: 59

## 2021-01-28 ENCOUNTER — Encounter: Payer: 59 | Admitting: Vascular Surgery

## 2021-01-31 ENCOUNTER — Other Ambulatory Visit: Payer: Self-pay | Admitting: Family Medicine

## 2021-01-31 ENCOUNTER — Other Ambulatory Visit (HOSPITAL_COMMUNITY): Payer: Self-pay | Admitting: Psychiatry

## 2021-01-31 ENCOUNTER — Other Ambulatory Visit (HOSPITAL_COMMUNITY): Payer: Self-pay

## 2021-01-31 DIAGNOSIS — M9901 Segmental and somatic dysfunction of cervical region: Secondary | ICD-10-CM | POA: Diagnosis not present

## 2021-01-31 DIAGNOSIS — M9902 Segmental and somatic dysfunction of thoracic region: Secondary | ICD-10-CM | POA: Diagnosis not present

## 2021-01-31 DIAGNOSIS — M9905 Segmental and somatic dysfunction of pelvic region: Secondary | ICD-10-CM | POA: Diagnosis not present

## 2021-01-31 DIAGNOSIS — M5442 Lumbago with sciatica, left side: Secondary | ICD-10-CM | POA: Diagnosis not present

## 2021-01-31 DIAGNOSIS — M5137 Other intervertebral disc degeneration, lumbosacral region: Secondary | ICD-10-CM | POA: Diagnosis not present

## 2021-01-31 DIAGNOSIS — M5417 Radiculopathy, lumbosacral region: Secondary | ICD-10-CM | POA: Diagnosis not present

## 2021-01-31 DIAGNOSIS — M62830 Muscle spasm of back: Secondary | ICD-10-CM | POA: Diagnosis not present

## 2021-01-31 DIAGNOSIS — M9903 Segmental and somatic dysfunction of lumbar region: Secondary | ICD-10-CM | POA: Diagnosis not present

## 2021-01-31 DIAGNOSIS — M5032 Other cervical disc degeneration, mid-cervical region, unspecified level: Secondary | ICD-10-CM | POA: Diagnosis not present

## 2021-01-31 MED ORDER — METOPROLOL TARTRATE 25 MG PO TABS: 12.5000 mg | ORAL_TABLET | Freq: Two times a day (BID) | ORAL | 0 refills | Status: DC

## 2021-01-31 MED ORDER — BUSPIRONE HCL 7.5 MG PO TABS
7.5000 mg | ORAL_TABLET | Freq: Three times a day (TID) | ORAL | 0 refills | Status: DC
  Filled 2021-01-31: qty 90, 30d supply, fill #0

## 2021-02-01 ENCOUNTER — Other Ambulatory Visit (HOSPITAL_COMMUNITY): Payer: Self-pay

## 2021-02-01 ENCOUNTER — Ambulatory Visit: Payer: 59 | Admitting: Professional

## 2021-02-01 MED ORDER — PANTOPRAZOLE SODIUM 40 MG PO TBEC
40.0000 mg | DELAYED_RELEASE_TABLET | Freq: Every day | ORAL | 0 refills | Status: DC
  Filled 2021-02-01: qty 90, 90d supply, fill #0

## 2021-02-02 ENCOUNTER — Encounter: Payer: Self-pay | Admitting: Physical Therapy

## 2021-02-02 ENCOUNTER — Ambulatory Visit: Payer: 59 | Admitting: Physical Therapy

## 2021-02-02 ENCOUNTER — Other Ambulatory Visit: Payer: Self-pay

## 2021-02-02 DIAGNOSIS — G8929 Other chronic pain: Secondary | ICD-10-CM

## 2021-02-02 DIAGNOSIS — R279 Unspecified lack of coordination: Secondary | ICD-10-CM

## 2021-02-02 DIAGNOSIS — M6281 Muscle weakness (generalized): Secondary | ICD-10-CM | POA: Diagnosis not present

## 2021-02-02 DIAGNOSIS — M545 Low back pain, unspecified: Secondary | ICD-10-CM | POA: Diagnosis not present

## 2021-02-02 DIAGNOSIS — R252 Cramp and spasm: Secondary | ICD-10-CM | POA: Diagnosis not present

## 2021-02-02 NOTE — Therapy (Signed)
[facility] @ [place], [place], [address] Phone: [phone]   Fax:  [phone]  Physical Therapy Treatment  Patient Details  Name: [name] MRN: [id number] Date of Birth: May 18, 1960 Referring Provider (PT): Vladimir Crofts, [place]   Encounter Date: 02/02/2021   PT End of Session - 02/02/21 0918     Visit Number 15    Date for PT Re-Evaluation 03/01/21    Authorization Type [employer] employee    PT Start Time [phone]    PT Stop Time 1001    PT Time Calculation (min) 44 min    Activity Tolerance Patient tolerated treatment well    Behavior During Therapy [facility] for tasks assessed/performed             Past Medical History:  Diagnosis Date   Acute bronchitis 05/25/2016   Anxiety    Arthritis    Chronic pain syndrome 01/06/2013   Chronic rhinosinusitis    Colon polyps    Cough 01/06/2013   Depression    Diabetes ([place]) 01/06/2013   Diabetes mellitus type 2 in obese [facility]) 01/06/2013   Sees Dr Calvert Cantor for eye exam Does not see podiatry, foot exam today unremarkable except for thick cracking skin on heals  pt denies    Dyspnea    with exertion    Dysuria 05/25/2016   Edema 01/06/2013   Epistaxis 05/25/2016   Fibroid, uterine    Fibromyalgia    GERD (gastroesophageal reflux disease)    Glaucoma 12-13   Hoarseness    Hoarseness of voice 05/11/2013   Hyperglycemia 04/13/2013   Hyperlipemia, mixed 06/06/2007   Qualifier: Diagnosis of  By: Wynona Luna She feels Lipitor caused increased low back pain and weakness     Hyperlipidemia    Hypertension    Hypokalemia 02/05/2013   IBS (irritable bowel syndrome) 02/13/2011   Low back pain 03/03/2013   Muscle cramp 05/22/2014   Obesity    Obesity, unspecified 05/11/2013   OSA (obstructive sleep apnea)    cpap    Pain in joint, shoulder region 06/27/2015   Perimenopause 10/05/2012   Pre-diabetes    Raynaud disease 06/16/2013   Thyroid disease     hypothyroidism   Vocal fold nodules     Past Surgical History:  Procedure Laterality Date   ABDOMINAL HYSTERECTOMY     BREAST EXCISIONAL BIOPSY Right    at age 65   CHOLECYSTECTOMY     colonoscopoy      [place]     x2   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   Procedure: LAPAROSCOPIC ROUX-EN-Y GASTRIC BYPASS WITH UPPER ENDOSCOPY;  Surgeon: [name], MD;  Location: [facility];  Service: General;  Laterality: N/A;   I & D EXTREMITY Left 04/11/2018   Procedure: DEBIDMENT DISTAL INTERPHALANGEAL LEFT MIDDLE;  Surgeon: Daryll Brod, MD;  Location: [facility];  Service: Orthopedics;  Laterality: Left;   INCISIONAL HERNIA REPAIR  08/10/2020   Procedure: LAPAROSCOPIC PRIMARY REPAIR OF INCISIONAL HERNIA;  Surgeon: [name], MD;  Location: [facility];  Service: General;;   MASS EXCISION Left 04/11/2018   Procedure: EXCISION MASS;  Surgeon: Daryll Brod, MD;  Location: [facility];  Service: Orthopedics;  Laterality: Left;   OOPHORECTOMY     POLYPECTOMY     ROTATOR CUFF REPAIR     UPPER GI ENDOSCOPY N/A 08/10/2020   Procedure: UPPER GI  ENDOSCOPY;  Surgeon: [name], MD;  Location: [facility];  Service: General;  Laterality: N/A;    There were no vitals filed for this visit.   Subjective Assessment - 02/02/21 0919     Subjective No complaints this AM    Pertinent History colon polyps, anxiety/depression, GERD, dysuria, hysterectomy, cholecystectomy, incisional hernia repair, gastric bypass surgery    Currently in Pain? No/denies            Treatment: Patient seen for aquatic therapy today.  Treatment took place in water 2.5-4 feet deep depending upon activity.  Pt entered the pool via stairs, step to step mild use of rails. Pt utilizes viscosity of the water required for strengthening. Water temp 94 degrees F.  Water walking with large noodle for UE push/pull 10x forward/back, then 10x side stepping/squat with jumping jack  arms. High knee marching with UE core press down into large noodle. Single limb stance Bil while pressing into large noodle for core activation then pt performs AROM for each hip 10x .  Underwater bicycle with large noodle behind pt: 2 min 4 bouts with 1 min rest.  Standing hamstring stretch Bil 3x 30 sec on step Standing plie squats with yellow UE wts 10x                              PT Short Term Goals - 12/17/20 1114       PT SHORT TERM GOAL #1   Title able to bulge pelvic floor    Status Achieved      PT SHORT TERM GOAL #2   Title pt will report at least 20% less straining    Baseline had BM every other day this week without straining    Status Achieved               PT Long Term Goals - 01/18/21 0935       PT LONG TERM GOAL #1   Title Pt will report BM at least every 3 days without straining or bearing down    Baseline I have one every other day except on Sat-Sun when I am working      PT LONG TERM GOAL #2   Title Pt will improve lumbar soft tissue mobility for improved low back pain    Status Achieved      PT LONG TERM GOAL #3   Title Pt will imporve pelvic floor strength to at least 3/5 and holding for 10 seconds due to improved coordination without breath holding    Baseline 3/5 unable to hold 10 seconds due to high tone    Status On-going      PT LONG TERM GOAL #4   Title Pt will be ind with HEP to maintain improvements and functional level    Status On-going                   Plan - 02/02/21 1003     Clinical Impression Statement Pt reports she feels "at least 50%-60% improved since her first session." Pt challenged by moving her hips against a current and engaging her core by pressing on large noodle. This proved to be a great challenge for pt's balance, core stabilization and single limb stance/stabilization.    Personal Factors and Comorbidities Comorbidity 3+    Comorbidities colon polyps, anxiety/depression, GERD,  dysuria, hysterectomy, cholecystectomy, incisional hernia    Examination-Activity Limitations Continence;Toileting    Examination-Participation Restrictions  Community Activity    Stability/Clinical Decision Making Evolving/Moderate complexity    Rehab Potential Excellent    PT Frequency 1x / week    PT Duration 12 weeks    PT Treatment/Interventions ADLs/Self Care Home Management;Biofeedback;Cryotherapy;Software engineer;Therapeutic activities;Therapeutic exercise;Neuromuscular re-education;Patient/family education;Manual techniques;Taping;Dry needling;Passive range of motion    PT Next Visit Plan aquatics to continue working on core and postural strength; pelvic to work on improved muscle length and breathing and bulging pelvic floor muscles for improved BMs    PT Home Exercise Plan Access Code: [email or web address]    Consulted and Agree with Plan of Care Patient             Patient will benefit from skilled therapeutic intervention in order to improve the following deficits and impairments:  Increased muscle spasms, Pain, Postural dysfunction, Decreased endurance, Decreased coordination, Decreased strength  Visit Diagnosis: Cramp and spasm  Chronic low back pain, unspecified back pain laterality, unspecified whether sciatica present  Unspecified lack of coordination  Muscle weakness (generalized)     Problem List Patient Active Problem List   Diagnosis Date Noted   Familial hypercholesterolemia 12/31/2020   Essential hypertension 12/31/2020   Morbid obesity ([place]) 12/31/2020   Prediabetes 12/31/2020   COVID-19 12/29/2020   Fibromyalgia 12/20/2020   Gastric bypass status for obesity 08/11/2020   Chronic idiopathic constipation 07/15/2020   Severe chronic obstructive pulmonary disease ([place]) 05/26/2020   Mild CAD 05/13/2020   Anal pain 04/12/2020   Dysphonia 04/12/2020   Epigastric pain 04/12/2020   Family history of malignant neoplasm of  gastrointestinal tract 04/12/2020   Left lower quadrant pain 04/12/2020   Pure hypercholesterolemia 04/12/2020   Thoracic and lumbosacral neuritis 04/12/2020   Vocal fold nodules    Thyroid disease    Hypertension    GERD (gastroesophageal reflux disease)    Fibroid, uterine    Depressive disorder    Chronic rhinosinusitis    Anxiety    Hemorrhoids 02/22/2020   Statin intolerance 12/28/2019   Elevated CK 11/12/2019   Lymphadenopathy 11/12/2019   Neck pain 11/04/2019   Cervical radiculopathy 05/19/2019   Numbness and tingling 04/17/2019   Tachycardia 38/25/0539   Umbilical hernia 76/73/4193   Sun-damaged skin 01/19/2019   Preventative health care 01/19/2019   Right arm pain 01/16/2019   Carpal tunnel syndrome of right wrist 09/04/2018   Entrapment of right ulnar nerve 09/04/2018   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/26/2018   Bilateral hand pain 01/30/2018   Mucoid cyst, joint 01/30/2018   Nodule of finger of both hands 01/10/2018   Weakness 07/03/2017   Osteoarthritis of spine with radiculopathy, cervical region 07/24/2016   Dysuria 05/25/2016   Epistaxis 05/25/2016   Arthritis 05/25/2016   Hypersomnia with sleep apnea 02/18/2016   Obesity (BMI 30.0-34.9) 02/18/2016   Thyroid activity decreased 07/30/2015   Left shoulder pain 06/27/2015   Pain in joint, shoulder region 06/27/2015   Myalgia 03/27/2015   Muscle cramp 05/22/2014   Mass of soft tissue of neck 02/23/2014   AP (abdominal pain) 02/15/2014   Raynaud disease 06/16/2013   Hoarseness of voice 05/11/2013   Backache 03/03/2013   Low back pain 03/03/2013   Hypokalemia 02/05/2013   Edema 01/06/2013   Diabetes mellitus type 2 in obese ([place]) 01/06/2013   Chronic pain syndrome 01/06/2013   Cough 01/06/2013   Diabetes ([place]) 01/06/2013   Perimenopause 10/05/2012   Glaucoma    SOB (shortness of breath) 09/10/2011   Slow transit constipation 02/13/2011   Vitamin  D deficiency 02/13/2011   IBS (irritable bowel  syndrome) 02/13/2011   RHINITIS, CHRONIC 01/14/2010   Atypical chest pain 06/16/2008   THYROMEGALY 11/11/2007   Allergic rhinitis 11/11/2007   ECZEMA, HANDS 07/22/2007   Hypothyroidism 06/06/2007   Hyperlipemia, mixed 06/06/2007   ADJ DISORDER WITH MIXED ANXIETY & DEPRESSED MOOD 06/06/2007   OSA (obstructive sleep apnea) 06/06/2007   COLONIC POLYPS, HX OF 06/06/2007    Rad Gramling, PTA 02/02/2021, 1:25 PM  [facility] @ [place], [place], [address] Phone: [phone]   Fax:  [phone]  Name: [name] MRN: [id number] Date of Birth: [birth year]

## 2021-02-09 ENCOUNTER — Other Ambulatory Visit: Payer: Self-pay

## 2021-02-09 ENCOUNTER — Ambulatory Visit: Payer: 59 | Admitting: Physical Therapy

## 2021-02-09 ENCOUNTER — Encounter: Payer: Self-pay | Admitting: Physical Therapy

## 2021-02-09 DIAGNOSIS — G8929 Other chronic pain: Secondary | ICD-10-CM | POA: Diagnosis not present

## 2021-02-09 DIAGNOSIS — M6281 Muscle weakness (generalized): Secondary | ICD-10-CM | POA: Diagnosis not present

## 2021-02-09 DIAGNOSIS — M545 Low back pain, unspecified: Secondary | ICD-10-CM | POA: Diagnosis not present

## 2021-02-09 DIAGNOSIS — R252 Cramp and spasm: Secondary | ICD-10-CM

## 2021-02-09 DIAGNOSIS — R279 Unspecified lack of coordination: Secondary | ICD-10-CM | POA: Diagnosis not present

## 2021-02-09 NOTE — Therapy (Addendum)
[facility] @ [place], [place], [address] Phone: [phone]   Fax:  [phone]  Physical Therapy Treatment  Patient Details  Name: [name] MRN: [id number] Date of Birth: 15-Oct-1960 Referring Provider (PT): Vladimir Crofts, [place]   Encounter Date: 02/09/2021   PT End of Session - 02/09/21 0922     Visit Number 16    Date for PT Re-Evaluation 03/01/21    Authorization Type [employer] employee    PT Start Time [phone]    PT Stop Time 1010    PT Time Calculation (min) 49 min    Activity Tolerance Patient tolerated treatment well    Behavior During Therapy [facility] for tasks assessed/performed             Past Medical History:  Diagnosis Date   Acute bronchitis 05/25/2016   Anxiety    Arthritis    Chronic pain syndrome 01/06/2013   Chronic rhinosinusitis    Colon polyps    Cough 01/06/2013   Depression    Diabetes ([place]) 01/06/2013   Diabetes mellitus type 2 in obese [facility]) 01/06/2013   Sees Dr Calvert Cantor for eye exam Does not see podiatry, foot exam today unremarkable except for thick cracking skin on heals  pt denies    Dyspnea    with exertion    Dysuria 05/25/2016   Edema 01/06/2013   Epistaxis 05/25/2016   Fibroid, uterine    Fibromyalgia    GERD (gastroesophageal reflux disease)    Glaucoma 12-13   Hoarseness    Hoarseness of voice 05/11/2013   Hyperglycemia 04/13/2013   Hyperlipemia, mixed 06/06/2007   Qualifier: Diagnosis of  By: Wynona Luna She feels Lipitor caused increased low back pain and weakness     Hyperlipidemia    Hypertension    Hypokalemia 02/05/2013   IBS (irritable bowel syndrome) 02/13/2011   Low back pain 03/03/2013   Muscle cramp 05/22/2014   Obesity    Obesity, unspecified 05/11/2013   OSA (obstructive sleep apnea)    cpap    Pain in joint, shoulder region 06/27/2015   Perimenopause 10/05/2012   Pre-diabetes    Raynaud disease 06/16/2013   Thyroid disease     hypothyroidism   Vocal fold nodules     Past Surgical History:  Procedure Laterality Date   ABDOMINAL HYSTERECTOMY     BREAST EXCISIONAL BIOPSY Right    at age 9   CHOLECYSTECTOMY     colonoscopoy      [place]     x2   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   Procedure: LAPAROSCOPIC ROUX-EN-Y GASTRIC BYPASS WITH UPPER ENDOSCOPY;  Surgeon: [name], MD;  Location: [facility];  Service: General;  Laterality: N/A;   I & D EXTREMITY Left 04/11/2018   Procedure: DEBIDMENT DISTAL INTERPHALANGEAL LEFT MIDDLE;  Surgeon: Daryll Brod, MD;  Location: [facility];  Service: Orthopedics;  Laterality: Left;   INCISIONAL HERNIA REPAIR  08/10/2020   Procedure: LAPAROSCOPIC PRIMARY REPAIR OF INCISIONAL HERNIA;  Surgeon: [name], MD;  Location: [facility];  Service: General;;   MASS EXCISION Left 04/11/2018   Procedure: EXCISION MASS;  Surgeon: Daryll Brod, MD;  Location: [facility];  Service: Orthopedics;  Laterality: Left;   OOPHORECTOMY     POLYPECTOMY     ROTATOR CUFF REPAIR     UPPER GI ENDOSCOPY N/A 08/10/2020   Procedure: UPPER GI  ENDOSCOPY;  Surgeon: [name], MD;  [facility];  Service: General;  Laterality: N/A;    There were no vitals filed for this visit.   Subjective Assessment - 02/09/21 1011     Subjective I can feel my stomach muscles when I lay down, still difficult when i am standing.    Pertinent History colon polyps, anxiety/depression, GERD, dysuria, hysterectomy, cholecystectomy, incisional hernia repair, gastric bypass surgery    Currently in Pain? No/denies    Aggravating Factors  Not sure now    Pain Relieving Factors excercising, putting pressure on the top of my buttocks    Multiple Pain Sites No             Treatment;Patient seen for aquatic therapy today.  Treatment took place in water 2.5-4 feet deep depending upon activity.  Pt entered the pool via steps, reciprocally and mild use of hand  rails. Pt requires buoyancy of water for support and to offload joints with strengthening exercises.  Pt utilizes viscosity of the water required for strengthening. Water temp 94 degrees F.  Standing in 75% depth:  Water walking with large noodle push & pull 10x forward/backward. Side stepping with mitten hands and deep squat 10x. VC to increase her speed/movement to increase current. High knee marching for core strength with large noodle for postural support 10x. Abdominal compressions f/b supine iron cross/plank 5 sec 6x with yellow UE weights. TC to contract buttocks.Underwater bicycle with large noodle 3 min 4x. Hip Bil circles 20x, hip flex/ext 15x no UE.                             PT Short Term Goals - 12/17/20 1114       PT SHORT TERM GOAL #1   Title able to bulge pelvic floor    Status Achieved      PT SHORT TERM GOAL #2   Title pt will report at least 20% less straining    Baseline had BM every other day this week without straining    Status Achieved               PT Long Term Goals - 01/18/21 0935       PT LONG TERM GOAL #1   Title Pt will report BM at least every 3 days without straining or bearing down    Baseline I have one every other day except on Sat-Sun when I am working      PT LONG TERM GOAL #2   Title Pt will improve lumbar soft tissue mobility for improved low back pain    Status Achieved      PT LONG TERM GOAL #3   Title Pt will imporve pelvic floor strength to at least 3/5 and holding for 10 seconds due to improved coordination without breath holding    Baseline 3/5 unable to hold 10 seconds due to high tone    Status On-going      PT LONG TERM GOAL #4   Title Pt will be ind with HEP to maintain improvements and functional level    Status On-going                   Plan - 02/09/21 1014     Clinical Impression Statement Pt reports she can finally feel her abdominals contracting but only when supine. She has  great diffilculty contracting them when standing and on land ( in the water  she does better.) Pt able to increase her bicycle in the water to 3 min today with no outward signs of fatigue. if pt felt an "irritation" in her Lt proximal buttocks she could allieviate it with digitial pressure.    Personal Factors and Comorbidities Comorbidity 3+    Comorbidities colon polyps, anxiety/depression, GERD, dysuria, hysterectomy, cholecystectomy, incisional hernia    Examination-Activity Limitations Continence;Toileting    Examination-Participation Restrictions Community Activity    Stability/Clinical Decision Making Evolving/Moderate complexity    Rehab Potential Excellent    PT Frequency 1x / week    PT Duration 12 weeks    PT Treatment/Interventions ADLs/Self Care Home Management;Biofeedback;Cryotherapy;Software engineer;Therapeutic activities;Therapeutic exercise;Neuromuscular re-education;Patient/family education;Manual techniques;Taping;Dry needling;Passive range of motion    PT Next Visit Plan Pt will see PT on land in next 1-2 visits. PT will re-assess pt.    PT Home Exercise Plan Access Code: [email or web address]    Consulted and Agree with Plan of Care Patient             Patient will benefit from skilled therapeutic intervention in order to improve the following deficits and impairments:  Increased muscle spasms, Pain, Postural dysfunction, Decreased endurance, Decreased coordination, Decreased strength  Visit Diagnosis: Cramp and spasm  Chronic low back pain, unspecified back pain laterality, unspecified whether sciatica present  Unspecified lack of coordination  Muscle weakness (generalized)     Problem List Patient Active Problem List   Diagnosis Date Noted   Familial hypercholesterolemia 12/31/2020   Essential hypertension 12/31/2020   Morbid obesity ([place]) 12/31/2020   Prediabetes 12/31/2020   COVID-19 12/29/2020   Fibromyalgia 12/20/2020   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/11/2020   Chronic idiopathic constipation 07/15/2020   Severe chronic obstructive pulmonary disease ([place]) 05/26/2020   Mild CAD 05/13/2020   Anal pain 04/12/2020   Dysphonia 04/12/2020   Epigastric pain 04/12/2020   Family history of malignant neoplasm of gastrointestinal tract 04/12/2020   Left lower quadrant pain 04/12/2020   Pure hypercholesterolemia 04/12/2020   Thoracic and lumbosacral neuritis 04/12/2020   Vocal fold nodules    Thyroid disease    Hypertension    GERD (gastroesophageal reflux disease)    Fibroid, uterine    Depressive disorder    Chronic rhinosinusitis    Anxiety    Hemorrhoids 02/22/2020   Statin intolerance 12/28/2019   Elevated CK 11/12/2019   Lymphadenopathy 11/12/2019   Neck pain 11/04/2019   Cervical radiculopathy 05/19/2019   Numbness and tingling 04/17/2019   Tachycardia 46/27/0350   Umbilical hernia 09/38/1829   Sun-damaged skin 01/19/2019   Preventative health care 01/19/2019   Right arm pain 01/16/2019   Carpal tunnel syndrome of right wrist 09/04/2018   Entrapment of right ulnar nerve 09/04/2018   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/26/2018   Bilateral hand pain 01/30/2018   Mucoid cyst, joint 01/30/2018   Nodule of finger of both hands 01/10/2018   Weakness 07/03/2017   Osteoarthritis of spine with radiculopathy, cervical region 07/24/2016   Dysuria 05/25/2016   Epistaxis 05/25/2016   Arthritis 05/25/2016   Hypersomnia with sleep apnea 02/18/2016   Obesity (BMI 30.0-34.9) 02/18/2016   Thyroid activity decreased 07/30/2015   Left shoulder pain 06/27/2015   Pain in joint, shoulder region 06/27/2015   Myalgia 03/27/2015   Muscle cramp 05/22/2014   Mass of soft tissue of neck 02/23/2014   AP (abdominal pain) 02/15/2014   Raynaud disease 06/16/2013   Hoarseness of voice 05/11/2013   Backache 03/03/2013   Low  back pain 03/03/2013   Hypokalemia 02/05/2013   Edema 01/06/2013   Diabetes mellitus type  2 in obese ([place]) 01/06/2013   Chronic pain syndrome 01/06/2013   Cough 01/06/2013   Diabetes ([place]) 01/06/2013   Perimenopause 10/05/2012   Glaucoma    SOB (shortness of breath) 09/10/2011   Slow transit constipation 02/13/2011   Vitamin D deficiency 02/13/2011   IBS (irritable bowel syndrome) 02/13/2011   RHINITIS, CHRONIC 01/14/2010   Atypical chest pain 06/16/2008   THYROMEGALY 11/11/2007   Allergic rhinitis 11/11/2007   ECZEMA, HANDS 07/22/2007   Hypothyroidism 06/06/2007   Hyperlipemia, mixed 06/06/2007   ADJ DISORDER WITH MIXED ANXIETY & DEPRESSED MOOD 06/06/2007   OSA (obstructive sleep apnea) 06/06/2007   COLONIC POLYPS, HX OF 06/06/2007    Hedy Garro, PTA 02/09/2021, 10:17 AM  [facility] @ [place], [place], [address] Phone: [phone]   Fax:  [phone]  Name: [name] MRN: [id number] Date of Birth: 17-Jan-1961  PHYSICAL THERAPY DISCHARGE SUMMARY  Visits from Start of Care: 16  Current functional level related to goals / functional outcomes:  See above goals   Remaining deficits: See above   Education / Equipment: HEP  Patient agrees to discharge. Patient goals were partially met. Patient is being discharged due to the patient's request.  Pt felt she was better  [facility], PT 03/03/21 2:29 PM

## 2021-02-14 ENCOUNTER — Other Ambulatory Visit: Payer: Self-pay | Admitting: Family Medicine

## 2021-02-14 ENCOUNTER — Other Ambulatory Visit (HOSPITAL_COMMUNITY): Payer: Self-pay

## 2021-02-14 DIAGNOSIS — M5442 Lumbago with sciatica, left side: Secondary | ICD-10-CM | POA: Diagnosis not present

## 2021-02-14 DIAGNOSIS — M5137 Other intervertebral disc degeneration, lumbosacral region: Secondary | ICD-10-CM | POA: Diagnosis not present

## 2021-02-14 DIAGNOSIS — M9902 Segmental and somatic dysfunction of thoracic region: Secondary | ICD-10-CM | POA: Diagnosis not present

## 2021-02-14 DIAGNOSIS — M9901 Segmental and somatic dysfunction of cervical region: Secondary | ICD-10-CM | POA: Diagnosis not present

## 2021-02-14 DIAGNOSIS — M9905 Segmental and somatic dysfunction of pelvic region: Secondary | ICD-10-CM | POA: Diagnosis not present

## 2021-02-14 DIAGNOSIS — M9903 Segmental and somatic dysfunction of lumbar region: Secondary | ICD-10-CM | POA: Diagnosis not present

## 2021-02-14 DIAGNOSIS — M5417 Radiculopathy, lumbosacral region: Secondary | ICD-10-CM | POA: Diagnosis not present

## 2021-02-14 DIAGNOSIS — M62830 Muscle spasm of back: Secondary | ICD-10-CM | POA: Diagnosis not present

## 2021-02-14 DIAGNOSIS — M5032 Other cervical disc degeneration, mid-cervical region, unspecified level: Secondary | ICD-10-CM | POA: Diagnosis not present

## 2021-02-14 MED ORDER — THYROID 90 MG PO TABS
90.0000 mg | ORAL_TABLET | Freq: Every day | ORAL | 0 refills | Status: DC
  Filled 2021-02-14: qty 90, 90d supply, fill #0

## 2021-02-15 ENCOUNTER — Other Ambulatory Visit: Payer: Self-pay | Admitting: Family Medicine

## 2021-02-15 ENCOUNTER — Encounter: Payer: 59 | Admitting: Physical Therapy

## 2021-02-15 ENCOUNTER — Other Ambulatory Visit (HOSPITAL_COMMUNITY): Payer: Self-pay

## 2021-02-15 DIAGNOSIS — E039 Hypothyroidism, unspecified: Secondary | ICD-10-CM | POA: Diagnosis not present

## 2021-02-15 MED ORDER — METOPROLOL TARTRATE 25 MG PO TABS
12.5000 mg | ORAL_TABLET | Freq: Two times a day (BID) | ORAL | 0 refills | Status: DC
  Filled 2021-02-15: qty 90, 90d supply, fill #0

## 2021-02-16 ENCOUNTER — Ambulatory Visit: Payer: 59 | Admitting: Physical Therapy

## 2021-02-16 DIAGNOSIS — E6609 Other obesity due to excess calories: Secondary | ICD-10-CM | POA: Diagnosis not present

## 2021-02-16 DIAGNOSIS — Z9884 Bariatric surgery status: Secondary | ICD-10-CM | POA: Diagnosis not present

## 2021-02-16 DIAGNOSIS — E039 Hypothyroidism, unspecified: Secondary | ICD-10-CM | POA: Diagnosis not present

## 2021-02-16 DIAGNOSIS — E119 Type 2 diabetes mellitus without complications: Secondary | ICD-10-CM | POA: Diagnosis not present

## 2021-02-18 ENCOUNTER — Ambulatory Visit: Payer: 59 | Admitting: Physical Therapy

## 2021-02-18 DIAGNOSIS — Z9884 Bariatric surgery status: Secondary | ICD-10-CM | POA: Diagnosis not present

## 2021-02-18 DIAGNOSIS — R5383 Other fatigue: Secondary | ICD-10-CM | POA: Diagnosis not present

## 2021-02-19 DIAGNOSIS — I10 Essential (primary) hypertension: Secondary | ICD-10-CM | POA: Diagnosis not present

## 2021-02-19 DIAGNOSIS — G4733 Obstructive sleep apnea (adult) (pediatric): Secondary | ICD-10-CM | POA: Diagnosis not present

## 2021-02-22 ENCOUNTER — Ambulatory Visit: Payer: 59 | Admitting: Family Medicine

## 2021-02-22 ENCOUNTER — Ambulatory Visit (INDEPENDENT_AMBULATORY_CARE_PROVIDER_SITE_OTHER): Payer: 59 | Admitting: Professional

## 2021-02-22 DIAGNOSIS — F411 Generalized anxiety disorder: Secondary | ICD-10-CM | POA: Diagnosis not present

## 2021-02-23 ENCOUNTER — Other Ambulatory Visit: Payer: Self-pay | Admitting: Family Medicine

## 2021-02-24 ENCOUNTER — Other Ambulatory Visit (HOSPITAL_COMMUNITY): Payer: Self-pay

## 2021-02-24 MED ORDER — AMLODIPINE BESYLATE 5 MG PO TABS
5.0000 mg | ORAL_TABLET | Freq: Every day | ORAL | 2 refills | Status: DC
  Filled 2021-02-24: qty 30, 30d supply, fill #0

## 2021-02-24 NOTE — Progress Notes (Signed)
Office Note     CC:  Concern for Raynauds disease Requesting Provider:  Mosie Lukes, MD  HPI: [name] is a 60 y.o. (10-16-1960) female presenting at the request of .Mosie Lukes, MD with concern for Raynaud's disease.    For as long as [name] can remember, her hands have always been cold.  She will have intermittent episodes of blanching discoloration in her fingers followed by colors of blue and red.  She has never had a wound on her finger that has not healed.  The episodes are not painful.  There is no motor or sensory loss.  She tries to keep her hands warm, regularly wearing gloves.  Episodes are not associated with constitutional symptoms such as fever and malaise.  She has no swallowing difficulties, no rashes. [name] limits her caffeine intake, and stopped smoking years ago.    [name] has recently championed being responsible for her health, having undergone gastric bypass surgery, losing 120 pounds, and working to live a healthy active lifestyle.  I congratulated her on these wonderful achievements.  She presents today for peace of mind and therapy options.  The pt is not on a statin for cholesterol management.  The pt is not on a daily aspirin.   Other AC:  - The pt is on medication for hypertension.   The pt is not diabetic.  Tobacco hx:  former  Past Medical History:  Diagnosis Date   Acute bronchitis 05/25/2016   Anxiety    Arthritis    Chronic pain syndrome 01/06/2013   Chronic rhinosinusitis    Colon polyps    Cough 01/06/2013   Depression    Diabetes ([place]) 01/06/2013   Diabetes mellitus type 2 in obese [facility]) 01/06/2013   Sees Dr Calvert Cantor for eye exam Does not see podiatry, foot exam today unremarkable except for thick cracking skin on heals  pt denies    Dyspnea    with exertion    Dysuria 05/25/2016   Edema 01/06/2013   Epistaxis 05/25/2016   Fibroid, uterine    Fibromyalgia    GERD (gastroesophageal reflux disease)    Glaucoma 12-13   Hoarseness     Hoarseness of voice 05/11/2013   Hyperglycemia 04/13/2013   Hyperlipemia, mixed 06/06/2007   Qualifier: Diagnosis of  By: Wynona Luna She feels Lipitor caused increased low back pain and weakness     Hyperlipidemia    Hypertension    Hypokalemia 02/05/2013   IBS (irritable bowel syndrome) 02/13/2011   Low back pain 03/03/2013   Muscle cramp 05/22/2014   Obesity    Obesity, unspecified 05/11/2013   OSA (obstructive sleep apnea)    cpap    Pain in joint, shoulder region 06/27/2015   Perimenopause 10/05/2012   Pre-diabetes    Raynaud disease 06/16/2013   Thyroid disease    hypothyroidism   Vocal fold nodules     Past Surgical History:  Procedure Laterality Date   ABDOMINAL HYSTERECTOMY     BREAST EXCISIONAL BIOPSY Right    at age 29   CHOLECYSTECTOMY     colonoscopoy      [place]     x2   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   Procedure: LAPAROSCOPIC ROUX-EN-Y GASTRIC BYPASS WITH UPPER ENDOSCOPY;  Surgeon: [name], MD;  [facility];  Service: General;  Laterality: N/A;   I & D EXTREMITY Left 04/11/2018   Procedure: DEBIDMENT DISTAL INTERPHALANGEAL LEFT MIDDLE;  Surgeon: Daryll Brod, MD;  Location: [facility];  Service: Orthopedics;  Laterality: Left;   INCISIONAL HERNIA REPAIR  08/10/2020   Procedure: LAPAROSCOPIC PRIMARY REPAIR OF INCISIONAL HERNIA;  Surgeon: [name], MD;  Location: [facility];  Service: General;;   MASS EXCISION Left 04/11/2018   Procedure: EXCISION MASS;  Surgeon: Daryll Brod, MD;  Location: [facility];  Service: Orthopedics;  Laterality: Left;   OOPHORECTOMY     POLYPECTOMY     ROTATOR CUFF REPAIR     UPPER GI ENDOSCOPY N/A 08/10/2020   Procedure: UPPER GI ENDOSCOPY;  Surgeon: [name], MD;  [facility];  Service: General;  Laterality: N/A;    Social History   Socioeconomic History   Marital status: Significant Other    Spouse name: Not on file   Number of  children: 0   Years of education: Not on file   Highest education level: Not on file  Occupational History   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  Tobacco Use   Smoking status: Former    Packs/day: 0.75    Years: 42.00    Pack years: 31.50    Types: Cigarettes    Quit date: 04/24/2005    Years since quitting: 15.8   Smokeless tobacco: Never   Tobacco comments:    smoked since age 30  Vaping Use   Vaping Use: Never used  Substance and Sexual Activity   Alcohol use: No   Drug use: Never   Sexual activity: Yes    Partners: Male  Other Topics Concern   Not on file  Social History Narrative   Endo-- Dr Dwyane Dee   ENT--Dr shoemaker   GI--Dr Lawnside   Pulm--Dr Clance   Rheum--Dr Charlestine Night         Social Determinants of Health   Financial Resource Strain: Not on file  Food Insecurity: Not on file  Transportation Needs: Not on file  Physical Activity: Not on file  Stress: Not on file  Social Connections: Not on file  Intimate Partner Violence: Not on file    Family History  Problem Relation Age of Onset   Alcohol abuse Father    Cancer Father        renal and colon   Hyperlipidemia Father    Hypertension Father    Stroke Father    Heart disease Father    Cancer Paternal Grandfather        colon   Diabetes Other    High blood pressure Mother    High Cholesterol Mother    Kidney disease Mother    Depression Mother    Anxiety disorder Mother    Obesity Mother    Breast cancer Other 66   Diabetes Sister    Diabetes Brother     Current Outpatient Medications  Medication Sig Dispense Refill   albuterol (VENTOLIN HFA) 108 (90 Base) MCG/ACT inhaler INHALE 2 PUFFS INTO THE LUNGS EVERY 4 (FOUR) HOURS AS NEEDED FOR WHEEZING OR SHORTNESS OF BREATH. 18 g 5   amLODipine (NORVASC) 5 MG tablet Take 1 tablet (5 mg total) by mouth daily. 30 tablet 2   busPIRone (BUSPAR) 7.5 MG tablet Take 1 tablet (7.5 mg total) by mouth 3 (three) times daily. 90 tablet 0   Calcium Carbonate 500  MG CHEW Chew 500 mg by mouth daily at 12 noon.     diphenhydrAMINE (BENADRYL) 25 MG tablet Take 25 mg by mouth every 6 (six) hours as needed for allergies or itching.  Evolocumab 140 MG/ML SOAJ INJECT 1 DOSE INTO THE SKIN EVERY 14 (FOURTEEN) DAYS. (Patient taking differently: Inject 140 mg into the skin every 14 (fourteen) days.) 6 mL 3   furosemide (LASIX) 20 MG tablet Take 1 tablet (20 mg total) by mouth daily as needed. 90 tablet 1   gabapentin (NEURONTIN) 100 MG capsule Take 1-3 capsules (100-300 mg total) by mouth 3 (three) times daily as needed. 90 capsule 3   influenza vac split quadrivalent PF (FLUARIX QUADRIVALENT) 0.5 ML injection Inject into the muscle. 0.5 mL 0   ipratropium (ATROVENT) 0.06 % nasal spray Place 2 sprays into the nose daily.     ipratropium (ATROVENT) 0.06 % nasal spray Place 2 sprays into both nostrils 3 (three) times daily as needed for rhinitis 15 mL 12   loratadine (CLARITIN) 10 MG tablet Take 20 mg by mouth daily.     lubiprostone (AMITIZA) 24 MCG capsule Take 1 capsule (24 mcg total) by mouth 2 (two) times daily with food and water 60 capsule 0   metoprolol tartrate (LOPRESSOR) 25 MG tablet Take 0.5 tablets (12.5 mg total) by mouth 2 (two) times daily. 90 tablet 0   Milnacipran (SAVELLA) 50 MG TABS tablet Take 1 tablet (50 mg total) by mouth 2 (two) times daily. 180 tablet 1   modafinil (PROVIGIL) 200 MG tablet Take 2 tablets (400 mg total) by mouth daily. 60 tablet 5   Multiple Vitamins-Minerals (MULTIVITAMIN WITH MINERALS) tablet Take 1 tablet by mouth daily.     pantoprazole (PROTONIX) 40 MG tablet Take 1 tablet (40 mg total) by mouth daily. 90 tablet 0   spironolactone (ALDACTONE) 50 MG tablet Take 1 tablet (50 mg total) by mouth 3 (three) times daily. 270 tablet 3   thyroid (ARMOUR) 90 MG tablet Take 1 tablet (90 mg total) by mouth daily. 90 tablet 0   Vitamin D, Ergocalciferol, (DRISDOL) 1.25 MG (50000 UNIT) CAPS capsule Take 1 capsule (50,000 Units total)  by mouth every 7 (seven) days. 12 capsule 1   No current facility-administered medications for this visit.    Allergies  Allergen Reactions   Crestor [Rosuvastatin] Other (See Comments)    Myalgias and rising CPK   Losartan Cough   Wellbutrin [Bupropion] Other (See Comments)    Dizziness and mental status changes   Atorvastatin     myalgias   Simvastatin     NDC Code:16714068101 NDC Code:16714068101 NDC UKGU:[id number]   Amoxicillin Rash   Aspirin Nausea Only and Other (See Comments)    GI upset REACTION: Upset stomach   Codeine Rash   Erythromycin Rash and Other (See Comments)   Penicillins Rash    Reaction: 15 years     REVIEW OF SYSTEMS:   [X]  denotes positive finding, [ ]  denotes negative finding Cardiac  Comments:  Chest pain or chest pressure:    Shortness of breath upon exertion:    Short of breath when lying flat:    Irregular heart rhythm:        Vascular    Pain in calf, thigh, or hip brought on by ambulation:    Pain in feet at night that wakes you up from your sleep:     Blood clot in your veins:    Leg swelling:         Pulmonary    Oxygen at home:    Productive cough:     Wheezing:         Neurologic    Sudden weakness in arms  or legs:     Sudden numbness in arms or legs:     Sudden onset of difficulty speaking or slurred speech:    Temporary loss of vision in one eye:     Problems with dizziness:         Gastrointestinal    Blood in stool:     Vomited blood:         Genitourinary    Burning when urinating:     Blood in urine:        Psychiatric    Major depression:         Hematologic    Bleeding problems:    Problems with blood clotting too easily:        Skin    Rashes or ulcers:        Constitutional    Fever or chills:      PHYSICAL EXAMINATION:  There were no vitals filed for this visit.  General:  WDWN in NAD; vital signs documented above Gait: Not observed HENT: WNL, normocephalic Pulmonary: normal non-labored  breathing , without Rales, rhonchi,  wheezing Cardiac: regular HR,  Abdomen: soft, NT, no masses Skin: without rashes Vascular Exam/Pulses:  Right Left  Radial 2+ (normal) 2+ (normal)  Ulnar 2+ (normal) 2+ (normal)  Femoral    Popliteal    DP 2+ (normal) 2+ (normal)  PT 2+ (normal) 2+ (normal)   Extremities: without ischemic changes, without Gangrene , without cellulitis; without open wounds;  Musculoskeletal: no muscle wasting or atrophy  Neurologic: A&O X 3;  No focal weakness or paresthesias are detected Psychiatric:  The pt has Normal affect.   Non-Invasive Vascular Imaging:   Right Doppler Findings:  +--------+--------+-----+---------+--------+  Site    PressureIndexDoppler  Comments  +--------+--------+-----+---------+--------+  [id number]          triphasic          +--------+--------+-----+---------+--------+  Radial               triphasic          +--------+--------+-----+---------+--------+  Ulnar                triphasic          +--------+--------+-----+---------+--------+       Left Doppler Findings:  +--------+--------+-----+----------+--------+  Site    PressureIndexDoppler   Comments  +--------+--------+-----+----------+--------+  [id number]          triphasic           +--------+--------+-----+----------+--------+  Radial               triphasic           +--------+--------+-----+----------+--------+  Ulnar                monophasic          +--------+--------+-----+----------+--------+      Technologist Notes:   Right: PPG tracings from the 2nd and 3rd finger demonstrate diminished  amplitude.  Left: PPG tracings from the 3rd and 4th finger demonstrate diminished  amplitude.      Summary:     Right: Diminished right 2nd and 3rd digits with mild improvement         after warming.  Left: Diminished 3rd and 4th digits with mild improvement after        warming.    ASSESSMENT/PLAN: BRINLEIGH TEW is a 60 y.o. female presenting with symptoms consistent with mild primary Raynaud's disease.  Independent review of upper extremity Dopplers with and without warming demonstrated  mild improvement in several fingers.  Multiple types of vasculitis, and autoimmune disorders can present as Raynaud's.  In an effort to ensure, the patient does not have an autoimmune issue, I have ordered an ANCA test.  While she notes some cold intolerance, episodes also occur at random.  I educated [place] that Raynaud's has been proven to occur with stress related events.  In an effort to improve her symptoms, I have prescribed nifedipine 30 mg extended release. This is the smallest dose of nifedipine.I asked that she continue smoking cessation, limiting her caffeine intake.  While rare, I asked that she hold on her amlodipine to ensure she does not have any episodes of hypotension when taking the nifedipine. Should the nifedipine not be beneficial, phosphodiesterase type V inhibitors can be used with good effect  -specifically Sildenafil 20mg  daily, titrated to 40mg  TID for symptoms. Also Losartan can be prescribed. Nahdia can follow-up with me as needed, and I will call her regarding her ANCA testing.  Should this be positive, I will refer her to rheumatology.   Broadus John, MD [facility] [phone]

## 2021-02-25 ENCOUNTER — Other Ambulatory Visit: Payer: Self-pay | Admitting: Family Medicine

## 2021-02-25 ENCOUNTER — Ambulatory Visit: Payer: 59 | Admitting: Vascular Surgery

## 2021-02-25 ENCOUNTER — Encounter: Payer: Self-pay | Admitting: Family Medicine

## 2021-02-25 ENCOUNTER — Encounter: Payer: Self-pay | Admitting: Vascular Surgery

## 2021-02-25 ENCOUNTER — Other Ambulatory Visit (HOSPITAL_COMMUNITY): Payer: Self-pay

## 2021-02-25 ENCOUNTER — Other Ambulatory Visit: Payer: Self-pay

## 2021-02-25 ENCOUNTER — Ambulatory Visit (HOSPITAL_COMMUNITY)
Admission: RE | Admit: 2021-02-25 | Discharge: 2021-02-25 | Disposition: A | Discharge status: Home / Self Care | Payer: 59 | Source: Ambulatory Visit | Attending: Vascular Surgery | Admitting: Vascular Surgery

## 2021-02-25 VITALS — BP 106/52 | HR 100 | Temp 97.9°F | Resp 20 | Ht 64.5 in | Wt 185.0 lb

## 2021-02-25 DIAGNOSIS — I7300 Raynaud's syndrome without gangrene: Secondary | ICD-10-CM

## 2021-02-25 MED ORDER — NIFEDIPINE ER OSMOTIC RELEASE 30 MG PO TB24: 30.0000 mg | ORAL_TABLET | Freq: Every day | ORAL | Status: DC

## 2021-02-25 MED ORDER — NYSTATIN 100000 UNIT/ML MT SUSP
Freq: Four times a day (QID) | OROMUCOSAL | 1 refills | Status: DC | PRN
  Filled 2021-02-25: qty 240, 12d supply, fill #0

## 2021-02-28 ENCOUNTER — Other Ambulatory Visit (HOSPITAL_COMMUNITY): Payer: Self-pay

## 2021-02-28 ENCOUNTER — Other Ambulatory Visit: Payer: Self-pay

## 2021-02-28 MED ORDER — NIFEDIPINE ER OSMOTIC RELEASE 30 MG PO TB24
30.0000 mg | ORAL_TABLET | Freq: Every day | ORAL | 3 refills | Status: DC
  Filled 2021-02-28: qty 30, 30d supply, fill #0
  Filled 2021-03-27: qty 30, 30d supply, fill #1
  Filled 2021-04-22: qty 30, 30d supply, fill #2
  Filled 2021-06-03: qty 30, 30d supply, fill #3

## 2021-03-02 ENCOUNTER — Encounter: Payer: Self-pay | Admitting: Vascular Surgery

## 2021-03-02 ENCOUNTER — Other Ambulatory Visit: Payer: Self-pay | Admitting: Vascular Surgery

## 2021-03-02 ENCOUNTER — Other Ambulatory Visit (HOSPITAL_COMMUNITY): Payer: Self-pay

## 2021-03-02 DIAGNOSIS — M5417 Radiculopathy, lumbosacral region: Secondary | ICD-10-CM | POA: Diagnosis not present

## 2021-03-02 DIAGNOSIS — M9905 Segmental and somatic dysfunction of pelvic region: Secondary | ICD-10-CM | POA: Diagnosis not present

## 2021-03-02 DIAGNOSIS — M9902 Segmental and somatic dysfunction of thoracic region: Secondary | ICD-10-CM | POA: Diagnosis not present

## 2021-03-02 DIAGNOSIS — M9903 Segmental and somatic dysfunction of lumbar region: Secondary | ICD-10-CM | POA: Diagnosis not present

## 2021-03-02 DIAGNOSIS — M5032 Other cervical disc degeneration, mid-cervical region, unspecified level: Secondary | ICD-10-CM | POA: Diagnosis not present

## 2021-03-02 DIAGNOSIS — M5137 Other intervertebral disc degeneration, lumbosacral region: Secondary | ICD-10-CM | POA: Diagnosis not present

## 2021-03-02 DIAGNOSIS — I7300 Raynaud's syndrome without gangrene: Secondary | ICD-10-CM | POA: Diagnosis not present

## 2021-03-02 DIAGNOSIS — M9901 Segmental and somatic dysfunction of cervical region: Secondary | ICD-10-CM | POA: Diagnosis not present

## 2021-03-02 DIAGNOSIS — M5442 Lumbago with sciatica, left side: Secondary | ICD-10-CM | POA: Diagnosis not present

## 2021-03-02 DIAGNOSIS — M62830 Muscle spasm of back: Secondary | ICD-10-CM | POA: Diagnosis not present

## 2021-03-06 ENCOUNTER — Other Ambulatory Visit (HOSPITAL_COMMUNITY): Payer: Self-pay | Admitting: Psychiatry

## 2021-03-06 ENCOUNTER — Other Ambulatory Visit: Payer: Self-pay | Admitting: Family Medicine

## 2021-03-07 ENCOUNTER — Telehealth (INDEPENDENT_AMBULATORY_CARE_PROVIDER_SITE_OTHER): Payer: 59 | Admitting: Psychiatry

## 2021-03-07 ENCOUNTER — Other Ambulatory Visit (HOSPITAL_COMMUNITY): Payer: Self-pay

## 2021-03-07 ENCOUNTER — Encounter (HOSPITAL_COMMUNITY): Payer: Self-pay | Admitting: Psychiatry

## 2021-03-07 ENCOUNTER — Telehealth (HOSPITAL_COMMUNITY): Payer: 59 | Admitting: Psychiatry

## 2021-03-07 DIAGNOSIS — F411 Generalized anxiety disorder: Secondary | ICD-10-CM | POA: Diagnosis not present

## 2021-03-07 DIAGNOSIS — M797 Fibromyalgia: Secondary | ICD-10-CM

## 2021-03-07 MED ORDER — FUROSEMIDE 20 MG PO TABS
20.0000 mg | ORAL_TABLET | Freq: Every day | ORAL | 1 refills | Status: DC | PRN
  Filled 2021-03-07: qty 90, 90d supply, fill #0
  Filled 2021-06-05: qty 90, 90d supply, fill #1

## 2021-03-07 MED ORDER — BUSPIRONE HCL 7.5 MG PO TABS
7.5000 mg | ORAL_TABLET | Freq: Three times a day (TID) | ORAL | 2 refills | Status: DC
  Filled 2021-03-07: qty 90, 30d supply, fill #0
  Filled 2021-04-06: qty 90, 30d supply, fill #1
  Filled 2021-05-09: qty 90, 30d supply, fill #2

## 2021-03-07 MED FILL — Evolocumab Subcutaneous Soln Auto-Injector 140 MG/ML: SUBCUTANEOUS | 84 days supply | Qty: 6 | Fill #2 | Status: AC

## 2021-03-07 NOTE — Progress Notes (Signed)
[place] Follow up visit  Patient Identification: [name] MRN:  [id number] Date of Evaluation:  03/07/2021 Referral Source: Primary care Chief Complaint:  follow up anxiety Visit Diagnosis:    ICD-10-CM   1. GAD (generalized anxiety disorder)  F41.1     2. Fibromyalgia  M79.7     Virtual Visit via Video Note  I connected with [name] on 03/07/21 at 10:00 AM EST by a video enabled telemedicine application and verified that I am speaking with the correct person using two identifiers.  Location: Patient: home Provider: home office   I discussed the limitations of evaluation and management by telemedicine and the availability of in person appointments. The patient expressed understanding and agreed to proceed.     I discussed the assessment and treatment plan with the patient. The patient was provided an opportunity to ask questions and all were answered. The patient agreed with the plan and demonstrated an understanding of the instructions.   The patient was advised to call back or seek an in-person evaluation if the symptoms worsen or if the condition fails to improve as anticipated.  I provided 15 minutes of non-face-to-face time during this encounter.   History of Present Illness: Patient is a 60 years old female lives with her significant other initially referred by primary care physician for establish care for anxiety.  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  Does not have any kids   Has been on Ativan Xanax before, Effexor. Job remains stressful but looking forward for 5 more years to retire and working in therapy with Juliann Pulse to work on anxiety and stress Taking gaba at night instead of tid, discussed that it helps anxiety as well Gets distracted or foggy when not busy [employer] for fibromyalgia   She follows with Francie Massing for therapy  She uses a CPAP machine at night  Aggravating factors; job stress Modifying  factors; significant other Duration since adult life      Past Psychiatric History: anxiety  Previous Psychotropic Medications: Yes  Effexor, xanax  Past Medical History:  Past Medical History:  Diagnosis Date   Acute bronchitis 05/25/2016   Anxiety    Arthritis    Chronic pain syndrome 01/06/2013   Chronic rhinosinusitis    Colon polyps    Cough 01/06/2013   Depression    Diabetes ([place]) 01/06/2013   Diabetes mellitus type 2 in obese ([place]) 01/06/2013   Sees Dr Calvert Cantor for eye exam Does not see podiatry, foot exam today unremarkable except for thick cracking skin on heals  pt denies    Dyspnea    with exertion    Dysuria 05/25/2016   Edema 01/06/2013   Epistaxis 05/25/2016   Fibroid, uterine    Fibromyalgia    GERD (gastroesophageal reflux disease)    Glaucoma 12-13   Hoarseness    Hoarseness of voice 05/11/2013   Hyperglycemia 04/13/2013   Hyperlipemia, mixed 06/06/2007   Qualifier: Diagnosis of  By: Wynona Luna She feels Lipitor caused increased low back pain and weakness     Hyperlipidemia    Hypertension    Hypokalemia 02/05/2013   IBS (irritable bowel syndrome) 02/13/2011   Low back pain 03/03/2013   Muscle cramp 05/22/2014   Obesity    Obesity, unspecified 05/11/2013   OSA (obstructive sleep apnea)    cpap    Pain in joint, shoulder region 06/27/2015   Perimenopause 10/05/2012   Pre-diabetes  Raynaud disease 06/16/2013   Thyroid disease    hypothyroidism   Vocal fold nodules     Past Surgical History:  Procedure Laterality Date   ABDOMINAL HYSTERECTOMY     BREAST EXCISIONAL BIOPSY Right    at age 53   CHOLECYSTECTOMY     colonoscopoy      [place]     x2   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   Procedure: LAPAROSCOPIC ROUX-EN-Y GASTRIC BYPASS WITH UPPER ENDOSCOPY;  Surgeon: [name], MD;  Location: [facility];  Service: General;  Laterality: N/A;   I & D EXTREMITY Left 04/11/2018   Procedure: DEBIDMENT DISTAL  INTERPHALANGEAL LEFT MIDDLE;  Surgeon: Daryll Brod, MD;  Location: [facility];  Service: Orthopedics;  Laterality: Left;   INCISIONAL HERNIA REPAIR  08/10/2020   Procedure: LAPAROSCOPIC PRIMARY REPAIR OF INCISIONAL HERNIA;  Surgeon: [name], MD;  Location: [facility];  Service: General;;   MASS EXCISION Left 04/11/2018   Procedure: EXCISION MASS;  Surgeon: Daryll Brod, MD;  Location: [facility];  Service: Orthopedics;  Laterality: Left;   OOPHORECTOMY     POLYPECTOMY     ROTATOR CUFF REPAIR     UPPER GI ENDOSCOPY N/A 08/10/2020   Procedure: UPPER GI ENDOSCOPY;  Surgeon: [name], MD;  Location: [facility];  Service: General;  Laterality: N/A;    Family Psychiatric History: dad : alcohol use  Family History:  Family History  Problem Relation Age of Onset   Alcohol abuse Father    Cancer Father        renal and colon   Hyperlipidemia Father    Hypertension Father    Stroke Father    Heart disease Father    Cancer Paternal Grandfather        colon   Diabetes Other    High blood pressure Mother    High Cholesterol Mother    Kidney disease Mother    Depression Mother    Anxiety disorder Mother    Obesity Mother    Breast cancer Other 99   Diabetes Sister    Diabetes Brother     Social History:   Social History   Socioeconomic History   Marital status: Significant Other    Spouse name: Not on file   Number of children: 0   Years of education: Not on file   Highest education level: Not on file  Occupational History   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  Tobacco Use   Smoking status: Former    Packs/day: 0.75    Years: 42.00    Pack years: 31.50    Types: Cigarettes    Quit date: 04/24/2005    Years since quitting: 15.8   Smokeless tobacco: Never   Tobacco comments:    smoked since age 39  Vaping Use   Vaping Use: Never used  Substance and Sexual Activity   Alcohol use: No   Drug use: Never   Sexual activity: Yes    Partners: Male   Other Topics Concern   Not on file  Social History Narrative   Endo-- Dr Dwyane Dee   ENT--Dr shoemaker   GI--Dr Giltner   Pulm--Dr Clance   Rheum--Dr Charlestine Night         Social Determinants of Health   Financial Resource Strain: Not on file  Food Insecurity: Not on file  Transportation Needs: Not on file  Physical Activity: Not on file  Stress: Not on file  Social Connections: Not on file      Allergies:   Allergies  Allergen Reactions   Crestor [Rosuvastatin] Other (See Comments)    Myalgias and rising CPK   Losartan Cough   Wellbutrin [Bupropion] Other (See Comments)    Dizziness and mental status changes   Atorvastatin     myalgias   Simvastatin     NDC Code:16714068101 NDC Code:[id number] NDC ZOXW:[id number]   Amoxicillin Rash   Aspirin Nausea Only and Other (See Comments)    GI upset REACTION: Upset stomach   Codeine Rash   Erythromycin Rash and Other (See Comments)   Penicillins Rash    Reaction: 15 years    Metabolic Disorder Labs: Lab Results  Component Value Date   HGBA1C 5.9 01/10/2021   MPG 122.63 08/03/2020   MPG 137 02/13/2020   No results found for: PROLACTIN Lab Results  Component Value Date   CHOL 104 01/10/2021   TRIG 92.0 01/10/2021   HDL 60.00 01/10/2021   CHOLHDL 2 01/10/2021   VLDL 18.4 01/10/2021   LDLCALC 25 01/10/2021   LDLCALC 17 10/11/2020   Lab Results  Component Value Date   TSH 1.02 02/13/2020    Therapeutic Level Labs: No results found for: LITHIUM No results found for: CBMZ No results found for: VALPROATE  Current Medications: Current Outpatient Medications  Medication Sig Dispense Refill   albuterol (VENTOLIN HFA) 108 (90 Base) MCG/ACT inhaler INHALE 2 PUFFS INTO THE LUNGS EVERY 4 (FOUR) HOURS AS NEEDED FOR WHEEZING OR SHORTNESS OF BREATH. 18 g 5   amLODipine (NORVASC) 5 MG tablet Take 1 tablet (5 mg total) by mouth daily. 30 tablet 2   busPIRone (BUSPAR) 7.5 MG tablet Take 1 tablet (7.5 mg total) by mouth 3  (three) times daily. 90 tablet 2   Calcium Carbonate 500 MG CHEW Chew 500 mg by mouth daily at 12 noon.     diphenhydrAMINE (BENADRYL) 25 MG tablet Take 25 mg by mouth every 6 (six) hours as needed for allergies or itching.     Evolocumab 140 MG/ML SOAJ INJECT 1 DOSE INTO THE SKIN EVERY 14 (FOURTEEN) DAYS. (Patient taking differently: Inject 140 mg into the skin every 14 (fourteen) days.) 6 mL 3   furosemide (LASIX) 20 MG tablet Take 1 tablet (20 mg total) by mouth daily as needed. 90 tablet 1   gabapentin (NEURONTIN) 100 MG capsule Take 1-3 capsules (100-300 mg total) by mouth 3 (three) times daily as needed. (Patient taking differently: Take 300 mg by mouth at bedtime.) 90 capsule 3   ipratropium (ATROVENT) 0.06 % nasal spray Place 2 sprays into the nose daily.     ipratropium (ATROVENT) 0.06 % nasal spray Place 2 sprays into both nostrils 3 (three) times daily as needed for rhinitis 15 mL 12   loratadine (CLARITIN) 10 MG tablet Take 20 mg by mouth daily.     lubiprostone (AMITIZA) 24 MCG capsule Take 1 capsule (24 mcg total) by mouth 2 (two) times daily with food and water 60 capsule 0   hydrocortisone 1 mg/4 mLs in nystatin suspension-diphenhydrAMINE liquid Swish and Spit 5 mL by mouth 4 (four) times daily as needed for mouth pain. 240 mL 1   metoprolol tartrate (LOPRESSOR) 25 MG tablet Take 0.5 tablets (12.5 mg total) by mouth 2 (two) times daily. 90 tablet 0   Milnacipran (SAVELLA) 50 MG TABS tablet Take 1 tablet (50 mg total) by mouth 2 (two) times daily. 180 tablet 1   modafinil (PROVIGIL) 200 MG  tablet Take 2 tablets (400 mg total) by mouth daily. 60 tablet 5   Multiple Vitamins-Minerals (MULTIVITAMIN WITH MINERALS) tablet Take 1 tablet by mouth daily.     NIFEdipine (PROCARDIA-XL/NIFEDICAL-XL) 30 MG 24 hr tablet Take 1 tablet (30 mg total) by mouth daily. 30 tablet 3   pantoprazole (PROTONIX) 40 MG tablet Take 1 tablet (40 mg total) by mouth daily. 90 tablet 0   spironolactone (ALDACTONE)  50 MG tablet Take 1 tablet (50 mg total) by mouth 3 (three) times daily. 270 tablet 3   thyroid (ARMOUR) 90 MG tablet Take 1 tablet (90 mg total) by mouth daily. 90 tablet 0   Vitamin D, Ergocalciferol, (DRISDOL) 1.25 MG (50000 UNIT) CAPS capsule Take 1 capsule (50,000 Units total) by mouth every 7 (seven) days. 12 capsule 1   Current Facility-Administered Medications  Medication Dose Route Frequency Provider Last Rate Last Admin   NIFEdipine (PROCARDIA-XL/NIFEDICAL-XL) 24 hr tablet 30 mg  30 mg Oral Daily Broadus John, MD         Psychiatric Specialty Exam: Review of Systems  Psychiatric/Behavioral:  Negative for agitation and self-injury.    There were no vitals taken for this visit.There is no height or weight on file to calculate BMI.  General Appearance: Casual  Eye Contact:  Fair  Speech:  Slow  Volume:  Decreased  Mood:  Euthymic  Affect:  Constricted  Thought Process:  Goal Directed  Orientation:  Full (Time, Place, and Person)  Thought Content:  Rumination  Suicidal Thoughts:  No  Homicidal Thoughts:  No  Memory:  Immediate;   Fair Recent;   Fair  Judgement:  Fair  Insight:  Fair  Psychomotor Activity:  Decreased  Concentration:  Concentration: Fair and Attention Span: Fair  Recall:  [employer] of Knowledge:Fair  Language: Fair  Akathisia:  No  Handed:    AIMS (if indicated):  not done  Assets:  Desire for Improvement Financial Resources/Insurance  ADL's:  Intact  Cognition: WNL  Sleep:  Fair   Screenings: GAD-7    Flowsheet Row Office Visit from 03/22/2015 in [facility] at [employer]  Total GAD-7 Score 0      PHQ2-9    [place] Visit from 09/09/2020 in [facility] from [facility]/24/2020 in [facility] Video Visit from 06/29/2020 in [facility] from 03/02/2020 in [facility] Office Visit from 07/03/2017 in [facility]  PHQ-2 Total Score 0 0 1 0 0  PHQ-9 Total Score 0 -- -- -- 4      Flowsheet Row Video Visit from 03/07/2021 in [facility] Video Visit from 11/29/2020 in [facility] Video Visit from 10/26/2020 in [facility] No Risk No Risk No Risk       Assessment and Plan:  As follows Prior documentation reviewed   GAD: gets anxious, related to job but wants to finish 5 years, discussed distraction and to work on activities to look for ward to do, continue buspar Discussed ME time and distractions  Fibromyalgia.  Managable on meds,   Follow-up in 3 months, she wants to come in 6 months, will send refills   Merian Capron, MD 11/14/202210:15 AM

## 2021-03-09 ENCOUNTER — Other Ambulatory Visit (HOSPITAL_COMMUNITY): Payer: Self-pay

## 2021-03-10 LAB — ANCA SCREEN W REFLEX TITER
ANCA Screen: POSITIVE — AB
Atypical P-ANCA titer: 1:160 {titer} — ABNORMAL HIGH

## 2021-03-11 ENCOUNTER — Other Ambulatory Visit (HOSPITAL_COMMUNITY): Payer: Self-pay

## 2021-03-11 ENCOUNTER — Encounter: Payer: Self-pay | Admitting: Family Medicine

## 2021-03-13 ENCOUNTER — Encounter: Payer: Self-pay | Admitting: Family Medicine

## 2021-03-14 ENCOUNTER — Other Ambulatory Visit (HOSPITAL_COMMUNITY): Payer: Self-pay

## 2021-03-14 ENCOUNTER — Encounter: Payer: Self-pay | Admitting: Vascular Surgery

## 2021-03-14 MED ORDER — LUBIPROSTONE 24 MCG PO CAPS
24.0000 ug | ORAL_CAPSULE | Freq: Two times a day (BID) | ORAL | 0 refills | Status: DC
  Filled 2021-03-14: qty 60, 30d supply, fill #0

## 2021-03-15 ENCOUNTER — Ambulatory Visit: Payer: 59 | Admitting: Professional

## 2021-03-15 NOTE — Telephone Encounter (Signed)
Placed in yellow bin

## 2021-03-15 NOTE — Progress Notes (Signed)
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 female here for evaluation of raynaud's symptoms and abnormal labs. She has had cold hands and feet longstanding but the development of discoloration in her fingers is newer in particular since her bariatric surgery earlier this year. She experiences triphasic color changes with cold exposure most often although sometimes without clear provocation. She denies any digital pitting, peeling, or ulceration change. She takes precautions for cold wearing gloves and wearing clothes sleeping at night to She was previously on amlodipine 5 mg PO daily as well as metoprolol for hypertension. This was recommended switching to nifedipine 30 mg PO daily after vascular surgery evaluation with findings consistent for raynaud's. She restarted the amlodipine again in combination with this since yesterday and reports noticing ankle swelling this morning. She takes modafinil 400 mg about 4 days per week for help with work concentration unchanged for about 10 years.   Labs reviewed 02/2021 ANCA ATYP P-ANCA 1:160  12/2020 TSH 0.427 CBC wnl CMP ALT 51 CK 97  12/2017 RF neg  Activities of Daily Living:  Patient reports morning stiffness for 2 hours.   Patient Reports nocturnal pain.  Difficulty dressing/grooming: Denies Difficulty climbing stairs: Denies Difficulty getting out of chair: Reports Difficulty using hands for taps, buttons, cutlery, and/or writing: Denies  Review of Systems  Constitutional:  Positive for fatigue.  HENT:  Positive for mouth dryness.   Eyes:  Positive for dryness.  Respiratory:  Negative for shortness of breath.   Cardiovascular:  Positive for swelling in legs/feet.   Gastrointestinal:  Positive for constipation.  Endocrine: Positive for cold intolerance and heat intolerance.  Genitourinary:  Negative for difficulty urinating.  Musculoskeletal:  Positive for joint pain, gait problem, joint pain, muscle weakness, morning stiffness and muscle tenderness.  Skin:  Negative for rash.  Allergic/Immunologic: Negative for susceptible to infections.  Neurological:  Positive for numbness.  Hematological:  Positive for bruising/bleeding tendency.  Psychiatric/Behavioral:  Negative for sleep disturbance.    PMFS History:  Patient Active Problem List   Diagnosis Date Noted   Diverticula of intestine 03/16/2021   Sciatica 03/16/2021   Abnormal ANCA test 03/16/2021   High risk medication use 03/16/2021   Familial hypercholesterolemia 12/31/2020   Essential hypertension 12/31/2020   Morbid obesity ([place]) 12/31/2020   Prediabetes 12/31/2020   COVID-19 12/29/2020   Fibromyalgia 12/20/2020   Raynaud's phenomenon 12/07/2020   Gastric bypass status for obesity 08/11/2020   Chronic idiopathic constipation 07/15/2020   Severe chronic obstructive pulmonary disease ([place]) 05/26/2020   Mild CAD 05/13/2020   Anal pain 04/12/2020   Dysphonia 04/12/2020   Epigastric pain 04/12/2020   Family history of malignant neoplasm of gastrointestinal tract 04/12/2020   Left lower quadrant pain 04/12/2020   Pure hypercholesterolemia 04/12/2020   Thoracic and lumbosacral neuritis 04/12/2020   Vocal fold nodules    Thyroid disease    Hypertension    GERD (gastroesophageal reflux disease)    Fibroid, uterine    Depressive disorder    Chronic rhinosinusitis    Anxiety    Hemorrhoids 02/22/2020  Statin intolerance 12/28/2019   Elevated CK 11/12/2019   Lymphadenopathy 11/12/2019   Neck pain 11/04/2019   Cervical radiculopathy 05/19/2019   Numbness and tingling 04/17/2019   Tachycardia 70/62/3762   Umbilical hernia 83/15/1761   Sun-damaged skin 01/19/2019   Preventative  health care 01/19/2019   Right arm pain 01/16/2019   Carpal tunnel syndrome of right wrist 09/04/2018   Entrapment of right ulnar nerve 09/04/2018   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/26/2018   Bilateral hand pain 01/30/2018   Mucoid cyst, joint 01/30/2018   Nodule of finger of both hands 01/10/2018   Weakness 07/03/2017   Osteoarthritis of spine with radiculopathy, cervical region 07/24/2016   Dysuria 05/25/2016   Epistaxis 05/25/2016   Arthritis 05/25/2016   Hypersomnia with sleep apnea 02/18/2016   Obesity (BMI 30.0-34.9) 02/18/2016   Thyroid activity decreased 07/30/2015   Left shoulder pain 06/27/2015   Pain in joint, shoulder region 06/27/2015   Myalgia 03/27/2015   Muscle cramp 05/22/2014   Mass of soft tissue of neck 02/23/2014   AP (abdominal pain) 02/15/2014   Raynaud disease 06/16/2013   Hoarseness of voice 05/11/2013   Backache 03/03/2013   Low back pain 03/03/2013   Hypokalemia 02/05/2013   Edema 01/06/2013   Diabetes mellitus type 2 in obese ([place]) 01/06/2013   Chronic pain syndrome 01/06/2013   Cough 01/06/2013   Diabetes ([place]) 01/06/2013   Perimenopause 10/05/2012   Glaucoma    SOB (shortness of breath) 09/10/2011   Slow transit constipation 02/13/2011   Vitamin D deficiency 02/13/2011   IBS (irritable bowel syndrome) 02/13/2011   RHINITIS, CHRONIC 01/14/2010   Atypical chest pain 06/16/2008   THYROMEGALY 11/11/2007   Allergic rhinitis 11/11/2007   ECZEMA, HANDS 07/22/2007   Hypothyroidism 06/06/2007   Hyperlipemia, mixed 06/06/2007   ADJ DISORDER WITH MIXED ANXIETY & DEPRESSED MOOD 06/06/2007   OSA (obstructive sleep apnea) 06/06/2007   COLONIC POLYPS, HX OF 06/06/2007    Past Medical History:  Diagnosis Date   Acute bronchitis 05/25/2016   Anxiety    Arthritis    Chronic pain syndrome 01/06/2013   Chronic rhinosinusitis    Colon polyps    Cough 01/06/2013   Depression    Diabetes ([place]) 01/06/2013   Diabetes mellitus type 2 in obese ([place])  01/06/2013   Sees Dr Calvert Cantor for eye exam Does not see podiatry, foot exam today unremarkable except for thick cracking skin on heals  pt denies    Dyspnea    with exertion    Dysuria 05/25/2016   Edema 01/06/2013   Epistaxis 05/25/2016   Fibroid, uterine    Fibromyalgia    GERD (gastroesophageal reflux disease)    Glaucoma 12-13   Hoarseness    Hoarseness of voice 05/11/2013   Hyperglycemia 04/13/2013   Hyperlipemia, mixed 06/06/2007   Qualifier: Diagnosis of  By: Wynona Luna She feels Lipitor caused increased low back pain and weakness     Hyperlipidemia    Hypertension    Hypokalemia 02/05/2013   IBS (irritable bowel syndrome) 02/13/2011   Low back pain 03/03/2013   Muscle cramp 05/22/2014   Obesity    Obesity, unspecified 05/11/2013   OSA (obstructive sleep apnea)    cpap    Pain in joint, shoulder region 06/27/2015   Perimenopause 10/05/2012   Pre-diabetes    Raynaud disease 06/16/2013   Thyroid disease    hypothyroidism   Vocal fold nodules     Family History  Problem Relation Age of  Onset   Alcohol abuse Father    Cancer Father        renal and colon   Hyperlipidemia Father    Hypertension Father    Stroke Father    Heart disease Father    Cancer Paternal Grandfather        colon   Diabetes Other    High blood pressure Mother    High Cholesterol Mother    Kidney disease Mother    Depression Mother    Anxiety disorder Mother    Obesity Mother    Breast cancer Other 1   Diabetes Sister    Diabetes Brother    Past Surgical History:  Procedure Laterality Date   ABDOMINAL HYSTERECTOMY     BREAST EXCISIONAL BIOPSY Right    at age 71   CHOLECYSTECTOMY     colonoscopoy      [place]     x2   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   Procedure: LAPAROSCOPIC ROUX-EN-Y GASTRIC BYPASS WITH UPPER ENDOSCOPY;  Surgeon: [name], MD;  Location: [facility];  Service: General;  Laterality: N/A;   I & D EXTREMITY Left 04/11/2018    Procedure: DEBIDMENT DISTAL INTERPHALANGEAL LEFT MIDDLE;  Surgeon: Daryll Brod, MD;  Location: [facility];  Service: Orthopedics;  Laterality: Left;   INCISIONAL HERNIA REPAIR  08/10/2020   Procedure: LAPAROSCOPIC PRIMARY REPAIR OF INCISIONAL HERNIA;  Surgeon: [name], MD;  Location: [facility];  Service: General;;   MASS EXCISION Left 04/11/2018   Procedure: EXCISION MASS;  Surgeon: Daryll Brod, MD;  Location: [facility];  Service: Orthopedics;  Laterality: Left;   OOPHORECTOMY     POLYPECTOMY     ROTATOR CUFF REPAIR     UPPER GI ENDOSCOPY N/A 08/10/2020   Procedure: UPPER GI ENDOSCOPY;  Surgeon: [name], MD;  Location: [facility];  Service: General;  Laterality: N/A;   Social History   Social History Narrative   Endo-- Dr Dwyane Dee   ENT--Dr shoemaker   GI--Dr Buccini   Pulm--Dr Clance   Rheum--Dr Charlestine Night         Immunization History  Administered Date(s) Administered   Influenza Split 01/10/2021   Influenza Whole 01/23/2008, 01/28/2009, 02/16/2010   Influenza, High Dose Seasonal PF 01/06/2013, 01/10/2016, 01/10/2018, 01/16/2019, 01/02/2020   Influenza,inj,Quad PF,6+ Mos 01/06/2013, 01/10/2016, 01/10/2018, 01/16/2019, 01/02/2020   Influenza-Unspecified 01/22/2014, 02/09/2015, 01/17/2017   PFIZER Comirnaty(Gray Top)Covid-19 Tri-Sucrose Vaccine 05/05/2019, 05/26/2019   PFIZER(Purple Top)SARS-COV-2 Vaccination 05/05/2019, 05/26/2019   Pneumococcal Conjugate-13 05/22/2014   Pneumococcal Polysaccharide-23 03/22/2015   Td 12/18/2000   Tdap 12/18/2000, 02/10/2014   Zoster Recombinat (Shingrix) 01/10/2018     Objective: Vital Signs: BP 125/86 (BP Location: Right Arm, Patient Position: Sitting, Cuff Size: Small)   Pulse (!) 102   Resp 14   Ht _0  (1.626 m)   Wt 185 lb 8 oz (84.1 kg)   BMI 31.84 kg/m    Physical Exam HENT:     Mouth/Throat:     Mouth: Mucous membranes are moist.     Pharynx: Oropharynx is clear.  Cardiovascular:     Rate and  Rhythm: Regular rhythm. Tachycardia present.  Pulmonary:     Effort: Pulmonary effort is normal.     Breath sounds: Normal breath sounds.  Musculoskeletal:     Right lower leg: Edema present.     Left lower leg: Edema present.  Skin:    General: Skin is warm and dry.  Findings: No rash.     Comments: Mild dusky discoloration of distal segment of 5th fingers b/l Normal appearing nailfold capillaroscopy bilaterally  Neurological:     General: No focal deficit present.     Mental Status: She is alert.  Psychiatric:        Mood and Affect: Mood normal.     Musculoskeletal Exam:  Shoulders full ROM no tenderness or swelling Elbows full ROM no tenderness or swelling Wrists full ROM no tenderness or swelling Fingers full ROM no tenderness or swelling Knees full ROM no tenderness or swelling Ankles full ROM no tenderness or swelling    Investigation: No additional findings.  Imaging: VAS [place] DOP BILAT COMP TOS DIGITS REYNAUD  Result Date: 02/25/2021 UPPER EXTREMITY DOPPLER STUDY Patient Name:  TOMORROW DEHAAS  Date of Exam:   02/25/2021 Medical Rec #: [id number]         Accession #:    [id number] Date of Birth: 1960-10-12          Patient Gender: F Patient Age:   55 years Exam Location:  [facility] Procedure:      VAS UE DOPPLER BILAT/COMP TOS, DIGITS (TO&UE REYNAUDS) Referring Phys: --------------------------------------------------------------------------------  Indications: Discoloration of the upper extremity digits.  Risk Factors: Diabetes. Performing Technologist: Ronal Fear RVS, RCS  Examination Guidelines: A complete evaluation includes B-mode imaging, spectral Doppler, color Doppler, and power Doppler as needed of all accessible portions of each vessel. Bilateral testing is considered an integral part of a complete examination. Limited examinations for reoccurring indications may be performed as noted.  Right Doppler Findings:  +--------+--------+-----+---------+--------+ Site    PressureIndexDoppler  Comments +--------+--------+-----+---------+--------+ [id number]          triphasic         +--------+--------+-----+---------+--------+ Radial               triphasic         +--------+--------+-----+---------+--------+ Ulnar                triphasic         +--------+--------+-----+---------+--------+  Left Doppler Findings: +--------+--------+-----+----------+--------+ Site    PressureIndexDoppler   Comments +--------+--------+-----+----------+--------+ [id number]          triphasic          +--------+--------+-----+----------+--------+ Radial               triphasic          +--------+--------+-----+----------+--------+ Ulnar                monophasic         +--------+--------+-----+----------+--------+  Technologist Notes: Right: PPG tracings from the 2nd and 3rd finger demonstrate diminished amplitude. Left: PPG tracings from the 3rd and 4th finger demonstrate diminished amplitude.  Summary:  Right: Diminished right 2nd and 3rd digits with mild improvement        after warming. Left: Diminished 3rd and 4th digits with mild improvement after       warming. *See table(s) above for measurements and observations. Electronically signed by Orlie Pollen on 02/25/2021 at 6:19:01 PM.    Final     Recent Labs: Lab Results  Component Value Date   WBC 4.8 01/10/2021   HGB 13.7 01/10/2021   PLT 366.0 01/10/2021   NA 140 01/10/2021   K 4.1 01/10/2021   CL 103 01/10/2021   CO2 27 01/10/2021   GLUCOSE 85 01/10/2021   BUN 10 01/10/2021   CREATININE 0.79 01/10/2021   BILITOT 0.4 03/16/2021  ALKPHOS 102 01/10/2021   AST 29 03/16/2021   ALT 43 (H) 03/16/2021   PROT 6.9 03/16/2021   ALBUMIN 4.1 01/10/2021   CALCIUM 9.9 01/10/2021   GFRAA 79 04/27/2020    Speciality Comments: No specialty comments available.  Procedures:  No procedures performed Allergies: Crestor  [rosuvastatin], Losartan, Wellbutrin [bupropion], Atorvastatin, Simvastatin, Amoxicillin, Aspirin, Codeine, Erythromycin, and Penicillins   Assessment / Plan:     Visit Diagnoses: Raynaud's phenomenon without gangrene   Raynaud's symptoms pretty considerable but no evidence of critical ischemia or serious injury. Normal vessels on visual exam today. Discussed treatments including importance of cold avoidance and protecting hands and core body temperature. I recommended she stop the amlodipine again due to developing increased pedal edema combining 2 CCBs and continue the nifedipine, this can be titrated up if needed or starting PDE5 inhibitor treatment.  Elevated CK  Previously high but decreased to normal on last labs in September, no clinical evidence of problem at this time.  Abnormal ANCA test - Plan: Sedimentation rate, Hepatic function panel, IgG, IgA, IgM  Atypical p-ANCA result most commonly associated with IBD related problem but can be nonspecific for various autoimmune issues. Negative ANA is reassuring. Checking ESR and immunoglobulins. With previous mildly high ALT will repeat LFTs as well for any evidence of hepatic involvement..  High risk medication use   Checking baseline labs including LFTs and immunoglobulins suspect IMT not needed at this time though.  Orders: Orders Placed This Encounter  Procedures   Sedimentation rate   Hepatic function panel   IgG, IgA, IgM    No orders of the defined types were placed in this encounter.   Follow-Up Instructions: No follow-ups on file.   Collier Salina, MD  Note - This record has been created using [employer].  Chart creation errors have been sought, but may not always  have been located. Such creation errors do not reflect on  the standard of medical care.

## 2021-03-16 ENCOUNTER — Ambulatory Visit: Payer: 59 | Admitting: Internal Medicine

## 2021-03-16 ENCOUNTER — Encounter: Payer: Self-pay | Admitting: Internal Medicine

## 2021-03-16 ENCOUNTER — Other Ambulatory Visit: Payer: Self-pay

## 2021-03-16 VITALS — BP 125/86 | HR 102 | Resp 14 | Ht 64.0 in | Wt 185.5 lb

## 2021-03-16 DIAGNOSIS — R748 Abnormal levels of other serum enzymes: Secondary | ICD-10-CM

## 2021-03-16 DIAGNOSIS — Z79899 Other long term (current) drug therapy: Secondary | ICD-10-CM | POA: Diagnosis not present

## 2021-03-16 DIAGNOSIS — R768 Other specified abnormal immunological findings in serum: Secondary | ICD-10-CM | POA: Diagnosis not present

## 2021-03-16 DIAGNOSIS — K5730 Diverticulosis of large intestine without perforation or abscess without bleeding: Secondary | ICD-10-CM | POA: Insufficient documentation

## 2021-03-16 DIAGNOSIS — I7300 Raynaud's syndrome without gangrene: Secondary | ICD-10-CM

## 2021-03-16 DIAGNOSIS — M5430 Sciatica, unspecified side: Secondary | ICD-10-CM | POA: Insufficient documentation

## 2021-03-16 NOTE — Telephone Encounter (Signed)
Yes from 11/9

## 2021-03-17 LAB — HEPATIC FUNCTION PANEL
AG Ratio: 1.7 (calc) (ref 1.0–2.5)
ALT: 43 U/L — ABNORMAL HIGH (ref 6–29)
AST: 29 U/L (ref 10–35)
Albumin: 4.3 g/dL (ref 3.6–5.1)
Alkaline phosphatase (APISO): 113 U/L (ref 37–153)
Bilirubin, Direct: 0.1 mg/dL (ref 0.0–0.2)
Globulin: 2.6 g/dL (calc) (ref 1.9–3.7)
Indirect Bilirubin: 0.3 mg/dL (calc) (ref 0.2–1.2)
Total Bilirubin: 0.4 mg/dL (ref 0.2–1.2)
Total Protein: 6.9 g/dL (ref 6.1–8.1)

## 2021-03-17 LAB — IGG, IGA, IGM
IgG (Immunoglobin G), Serum: 696 mg/dL (ref 600–1640)
IgM, Serum: 433 mg/dL — ABNORMAL HIGH (ref 50–300)
Immunoglobulin A: 169 mg/dL (ref 47–310)

## 2021-03-17 LAB — SEDIMENTATION RATE: Sed Rate: 38 mm/h — ABNORMAL HIGH (ref 0–30)

## 2021-03-22 DIAGNOSIS — G4733 Obstructive sleep apnea (adult) (pediatric): Secondary | ICD-10-CM | POA: Diagnosis not present

## 2021-03-22 DIAGNOSIS — I10 Essential (primary) hypertension: Secondary | ICD-10-CM | POA: Diagnosis not present

## 2021-03-25 ENCOUNTER — Encounter: Payer: Self-pay | Admitting: Internal Medicine

## 2021-03-25 DIAGNOSIS — H40013 Open angle with borderline findings, low risk, bilateral: Secondary | ICD-10-CM | POA: Diagnosis not present

## 2021-03-27 ENCOUNTER — Other Ambulatory Visit: Payer: Self-pay | Admitting: Family Medicine

## 2021-03-28 ENCOUNTER — Other Ambulatory Visit (HOSPITAL_COMMUNITY): Payer: Self-pay

## 2021-03-28 ENCOUNTER — Other Ambulatory Visit: Payer: Self-pay | Admitting: *Deleted

## 2021-03-28 DIAGNOSIS — M9902 Segmental and somatic dysfunction of thoracic region: Secondary | ICD-10-CM | POA: Diagnosis not present

## 2021-03-28 DIAGNOSIS — M9905 Segmental and somatic dysfunction of pelvic region: Secondary | ICD-10-CM | POA: Diagnosis not present

## 2021-03-28 DIAGNOSIS — M5417 Radiculopathy, lumbosacral region: Secondary | ICD-10-CM | POA: Diagnosis not present

## 2021-03-28 DIAGNOSIS — M9901 Segmental and somatic dysfunction of cervical region: Secondary | ICD-10-CM | POA: Diagnosis not present

## 2021-03-28 DIAGNOSIS — M62830 Muscle spasm of back: Secondary | ICD-10-CM | POA: Diagnosis not present

## 2021-03-28 DIAGNOSIS — M9903 Segmental and somatic dysfunction of lumbar region: Secondary | ICD-10-CM | POA: Diagnosis not present

## 2021-03-28 DIAGNOSIS — M5032 Other cervical disc degeneration, mid-cervical region, unspecified level: Secondary | ICD-10-CM | POA: Diagnosis not present

## 2021-03-28 DIAGNOSIS — M5137 Other intervertebral disc degeneration, lumbosacral region: Secondary | ICD-10-CM | POA: Diagnosis not present

## 2021-03-28 DIAGNOSIS — M5442 Lumbago with sciatica, left side: Secondary | ICD-10-CM | POA: Diagnosis not present

## 2021-03-28 MED ORDER — ACETAMINOPHEN 500 MG PO TABS
1000.0000 mg | ORAL_TABLET | Freq: Three times a day (TID) | ORAL | 0 refills | Status: AC
  Filled 2021-03-28: qty 30, 5d supply, fill #0

## 2021-03-28 NOTE — Telephone Encounter (Signed)
Patient sent a mychart for refill on Tylenol 500mg .  It last sent in by surgeon Dr. [name], [place]. Are you ok with refilling.

## 2021-03-29 NOTE — Progress Notes (Signed)
Office Visit Note  Patient: [name]             Date of Birth: 01/01/1961           MRN: [id number]             PCP: Mosie Lukes, MD Referring: Mosie Lukes, MD Visit Date: 03/30/2021   Subjective:  Follow-up (Doing good)   History of Present Illness: [name] is a 60 y.o. female here for follow up with raynaud's symptoms and abnormal labs initial visit demonstrating increased serum IgM of 433 ALT of 43 and ESR 38. We also recommended switching back to a single calcium channel blocker due to peripheral edema increased on combination of amlodipine and nifedipine.  Now she is just taking the Procardia 30 mg daily and had resolution of the lower extremity edema.  Hand discoloration pain and tightness continues intermittently as before.  Previous HPI 03/16/21 [name] is a 60 y.o. female here for evaluation of raynaud's symptoms and abnormal labs. She has had cold hands and feet longstanding but the development of discoloration in her fingers is newer in particular since her bariatric surgery earlier this year. She experiences triphasic color changes with cold exposure most often although sometimes without clear provocation. She denies any digital pitting, peeling, or ulceration change. She takes precautions for cold wearing gloves and wearing clothes sleeping at night to She was previously on amlodipine 5 mg PO daily as well as metoprolol for hypertension. This was recommended switching to nifedipine 30 mg PO daily after vascular surgery evaluation with findings consistent for raynaud's. She restarted the amlodipine again in combination with this since yesterday and reports noticing ankle swelling this morning. She takes modafinil 400 mg about 4 days per week for help with work concentration unchanged for about 10 years.    Labs reviewed 02/2021 ANCA ATYP P-ANCA 1:160   12/2020 TSH 0.427 CBC wnl CMP ALT 51 CK 97   12/2017 RF neg   Review of Systems   Constitutional:  Positive for fatigue.  HENT:  Negative for mouth dryness.   Eyes:  Positive for dryness.  Respiratory:  Negative for shortness of breath.   Cardiovascular:  Negative for swelling in legs/feet.  Gastrointestinal:  Positive for constipation.  Endocrine: Positive for cold intolerance.  Genitourinary:  Negative for difficulty urinating.  Musculoskeletal:  Positive for joint pain, joint pain and morning stiffness.  Skin:  Positive for rash.  Allergic/Immunologic: Negative for susceptible to infections.  Neurological:  Positive for dizziness, light-headedness and numbness.  Hematological:  Positive for bruising/bleeding tendency.  Psychiatric/Behavioral:  Negative for sleep disturbance.    PMFS History:  Patient Active Problem List   Diagnosis Date Noted   Diverticula of intestine 03/16/2021   Sciatica 03/16/2021   Abnormal ANCA test 03/16/2021   High risk medication use 03/16/2021   Familial hypercholesterolemia 12/31/2020   Essential hypertension 12/31/2020   Morbid obesity ([place]) 12/31/2020   Prediabetes 12/31/2020   COVID-19 12/29/2020   Fibromyalgia 12/20/2020   Raynaud's phenomenon 12/07/2020   Gastric bypass status for obesity 08/11/2020   Chronic idiopathic constipation 07/15/2020   Severe chronic obstructive pulmonary disease ([place]) 05/26/2020   Mild CAD 05/13/2020   Anal pain 04/12/2020   Dysphonia 04/12/2020   Epigastric pain 04/12/2020   Family history of malignant neoplasm of gastrointestinal tract 04/12/2020   Left lower quadrant pain 04/12/2020   Pure hypercholesterolemia 04/12/2020   Thoracic and lumbosacral neuritis 04/12/2020   Vocal fold  nodules    Thyroid disease    Hypertension    GERD (gastroesophageal reflux disease)    Fibroid, uterine    Depressive disorder    Chronic rhinosinusitis    Anxiety    Hemorrhoids 02/22/2020   Statin intolerance 12/28/2019   Elevated CK 11/12/2019   Lymphadenopathy 11/12/2019   Neck pain 11/04/2019    Cervical radiculopathy 05/19/2019   Numbness and tingling 04/17/2019   Tachycardia 50/38/8828   Umbilical hernia 00/34/9179   Sun-damaged skin 01/19/2019   Preventative health care 01/19/2019   Right arm pain 01/16/2019   Carpal tunnel syndrome of right wrist 09/04/2018   Entrapment of right ulnar nerve 09/04/2018   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/26/2018   Bilateral hand pain 01/30/2018   Mucoid cyst, joint 01/30/2018   Nodule of finger of both hands 01/10/2018   Weakness 07/03/2017   Osteoarthritis of spine with radiculopathy, cervical region 07/24/2016   Dysuria 05/25/2016   Epistaxis 05/25/2016   Arthritis 05/25/2016   Hypersomnia with sleep apnea 02/18/2016   Obesity (BMI 30.0-34.9) 02/18/2016   Thyroid activity decreased 07/30/2015   Left shoulder pain 06/27/2015   Pain in joint, shoulder region 06/27/2015   Myalgia 03/27/2015   Muscle cramp 05/22/2014   Mass of soft tissue of neck 02/23/2014   AP (abdominal pain) 02/15/2014   Raynaud disease 06/16/2013   Hoarseness of voice 05/11/2013   Backache 03/03/2013   Low back pain 03/03/2013   Hypokalemia 02/05/2013   Edema 01/06/2013   Diabetes mellitus type 2 in obese ([place]) 01/06/2013   Chronic pain syndrome 01/06/2013   Cough 01/06/2013   Diabetes ([place]) 01/06/2013   Perimenopause 10/05/2012   Glaucoma    SOB (shortness of breath) 09/10/2011   Slow transit constipation 02/13/2011   Vitamin D deficiency 02/13/2011   IBS (irritable bowel syndrome) 02/13/2011   RHINITIS, CHRONIC 01/14/2010   Atypical chest pain 06/16/2008   THYROMEGALY 11/11/2007   Allergic rhinitis 11/11/2007   ECZEMA, HANDS 07/22/2007   Hypothyroidism 06/06/2007   Hyperlipemia, mixed 06/06/2007   ADJ DISORDER WITH MIXED ANXIETY & DEPRESSED MOOD 06/06/2007   OSA (obstructive sleep apnea) 06/06/2007   COLONIC POLYPS, HX OF 06/06/2007    Past Medical History:  Diagnosis Date   Acute bronchitis 05/25/2016   Anxiety    Arthritis    Chronic  pain syndrome 01/06/2013   Chronic rhinosinusitis    Colon polyps    Cough 01/06/2013   Depression    Diabetes ([place]) 01/06/2013   Diabetes mellitus type 2 in obese [facility]) 01/06/2013   Sees Dr Calvert Cantor for eye exam Does not see podiatry, foot exam today unremarkable except for thick cracking skin on heals  pt denies    Dyspnea    with exertion    Dysuria 05/25/2016   Edema 01/06/2013   Epistaxis 05/25/2016   Fibroid, uterine    Fibromyalgia    GERD (gastroesophageal reflux disease)    Glaucoma 12-13   Hoarseness    Hoarseness of voice 05/11/2013   Hyperglycemia 04/13/2013   Hyperlipemia, mixed 06/06/2007   Qualifier: Diagnosis of  By: Wynona Luna She feels Lipitor caused increased low back pain and weakness     Hyperlipidemia    Hypertension    Hypokalemia 02/05/2013   IBS (irritable bowel syndrome) 02/13/2011   Low back pain 03/03/2013   Muscle cramp 05/22/2014   Obesity    Obesity, unspecified 05/11/2013   OSA (obstructive sleep apnea)    cpap  Pain in joint, shoulder region 06/27/2015   Perimenopause 10/05/2012   Pre-diabetes    Raynaud disease 06/16/2013   Thyroid disease    hypothyroidism   Vocal fold nodules     Family History  Problem Relation Age of Onset   Alcohol abuse Father    Cancer Father        renal and colon   Hyperlipidemia Father    Hypertension Father    Stroke Father    Heart disease Father    Cancer Paternal Grandfather        colon   Diabetes Other    High blood pressure Mother    High Cholesterol Mother    Kidney disease Mother    Depression Mother    Anxiety disorder Mother    Obesity Mother    Breast cancer Other 23   Diabetes Sister    Diabetes Brother    Past Surgical History:  Procedure Laterality Date   ABDOMINAL HYSTERECTOMY     BREAST EXCISIONAL BIOPSY Right    at age 23   CHOLECYSTECTOMY     colonoscopoy      [place]     x2   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   Procedure:  LAPAROSCOPIC ROUX-EN-Y GASTRIC BYPASS WITH UPPER ENDOSCOPY;  Surgeon: [name], MD;  Location: [facility];  Service: General;  Laterality: N/A;   I & D EXTREMITY Left 04/11/2018   Procedure: DEBIDMENT DISTAL INTERPHALANGEAL LEFT MIDDLE;  Surgeon: Daryll Brod, MD;  Location: [place];  Service: Orthopedics;  Laterality: Left;   INCISIONAL HERNIA REPAIR  08/10/2020   Procedure: LAPAROSCOPIC PRIMARY REPAIR OF INCISIONAL HERNIA;  Surgeon: [name], MD;  Location: [facility];  Service: General;;   MASS EXCISION Left 04/11/2018   Procedure: EXCISION MASS;  Surgeon: Daryll Brod, MD;  Location: [place];  Service: Orthopedics;  Laterality: Left;   OOPHORECTOMY     POLYPECTOMY     ROTATOR CUFF REPAIR     UPPER GI ENDOSCOPY N/A 08/10/2020   Procedure: UPPER GI ENDOSCOPY;  Surgeon: [name], MD;  Location: [facility];  Service: General;  Laterality: N/A;   Social History   Social History Narrative   Endo-- Dr Dwyane Dee   ENT--Dr shoemaker   GI--Dr Buccini   Pulm--Dr Clance   Rheum--Dr Charlestine Night         Immunization History  Administered Date(s) Administered   Influenza Split 01/10/2021   Influenza Whole 01/23/2008, 01/28/2009, 02/16/2010   Influenza, High Dose Seasonal PF 01/06/2013, 01/10/2016, 01/10/2018, 01/16/2019, 01/02/2020   Influenza,inj,Quad PF,6+ Mos 01/06/2013, 01/10/2016, 01/10/2018, 01/16/2019, 01/02/2020   Influenza-Unspecified 01/22/2014, 02/09/2015, 01/17/2017   PFIZER Comirnaty(Gray Top)Covid-19 Tri-Sucrose Vaccine 05/05/2019, 05/26/2019   PFIZER(Purple Top)SARS-COV-2 Vaccination 05/05/2019, 05/26/2019   Pneumococcal Conjugate-13 05/22/2014   Pneumococcal Polysaccharide-23 03/22/2015   Td 12/18/2000   Tdap 12/18/2000, 02/10/2014   Zoster Recombinat (Shingrix) 01/10/2018     Objective: Vital Signs: BP 106/65 (BP Location: Left Arm, Patient Position: Sitting, Cuff Size: Normal)   Pulse 77   Resp 15   Ht 5' 4.5" (1.638 m)   Wt 185 lb (83.9 kg)    BMI 31.26 kg/m    Cardiovascular:     Rate and Rhythm: Regular rhythm. Normal rate. Pulmonary:     Effort: Pulmonary effort is normal.     Breath sounds: Normal breath sounds.  Musculoskeletal:     No pedal edema present Skin:    General: Skin is warm and  dry.     Findings: No rash.     Comments: Mild dusky discoloration of distal segment of 5th fingers b/l no pitting or lesions Psychiatric:        Mood and Affect: Mood normal.     Musculoskeletal Exam:  Elbows full ROM no tenderness or swelling Wrists full ROM no tenderness or swelling Fingers full ROM no tenderness or swelling   Investigation: No additional findings.  Imaging: No results found.  Recent Labs: Lab Results  Component Value Date   WBC 4.8 01/10/2021   HGB 13.7 01/10/2021   PLT 366.0 01/10/2021   NA 140 01/10/2021   K 4.1 01/10/2021   CL 103 01/10/2021   CO2 27 01/10/2021   GLUCOSE 85 01/10/2021   BUN 10 01/10/2021   CREATININE 0.79 01/10/2021   BILITOT 0.4 03/16/2021   ALKPHOS 102 01/10/2021   AST 29 03/16/2021   ALT 43 (H) 03/16/2021   PROT 6.9 03/16/2021   ALBUMIN 4.1 01/10/2021   CALCIUM 9.9 01/10/2021   GFRAA 79 04/27/2020    Speciality Comments: No specialty comments available.  Procedures:  No procedures performed Allergies: Crestor [rosuvastatin], Losartan, Wellbutrin [bupropion], Atorvastatin, Simvastatin, Amoxicillin, Aspirin, Codeine, Erythromycin, and Penicillins   Assessment / Plan:     Visit Diagnoses: Raynaud's disease without gangrene - Plan: tadalafil (CIALIS) 5 MG tablet  Discussed treatment options for ongoing Raynaud's symptoms I do not see systemic evidence of inflammatory disease or vascular complications.  Reviewed options trial of increasing calcium channel blocker versus adding PDE 5 she is more interested in alternate mechanism of action.  Start tadalafil 5 mg p.o. daily for Raynaud's disease and can continue the Procardia 30 mg daily.  Discussed cautions to  monitor for symptomatic hypotension also to avoid nitrate containing medications.  Essential hypertension  Blood pressure currently in normal range today we will continue the current Procardia discussion for symptom monitoring with addition of another vasodilator.  Orders: No orders of the defined types were placed in this encounter.  Meds ordered this encounter  Medications   tadalafil (CIALIS) 5 MG tablet    Sig: Take 1 tablet (5 mg total) by mouth daily.    Dispense:  30 tablet    Refill:  2      Follow-Up Instructions: Return in about 3 months (around 06/28/2021) for Raynaud's NIF/TAD f/u .   Collier Salina, MD  Note - This record has been created using [employer].  Chart creation errors have been sought, but may not always  have been located. Such creation errors do not reflect on  the standard of medical care.

## 2021-03-30 ENCOUNTER — Ambulatory Visit: Payer: 59 | Admitting: Internal Medicine

## 2021-03-30 ENCOUNTER — Other Ambulatory Visit (HOSPITAL_COMMUNITY): Payer: Self-pay

## 2021-03-30 ENCOUNTER — Encounter: Payer: Self-pay | Admitting: Internal Medicine

## 2021-03-30 ENCOUNTER — Other Ambulatory Visit: Payer: Self-pay

## 2021-03-30 VITALS — BP 106/65 | HR 77 | Resp 15 | Ht 64.5 in | Wt 185.0 lb

## 2021-03-30 DIAGNOSIS — I10 Essential (primary) hypertension: Secondary | ICD-10-CM

## 2021-03-30 DIAGNOSIS — I7300 Raynaud's syndrome without gangrene: Secondary | ICD-10-CM | POA: Diagnosis not present

## 2021-03-30 MED ORDER — TADALAFIL 5 MG PO TABS
5.0000 mg | ORAL_TABLET | Freq: Every day | ORAL | 2 refills | Status: DC
  Filled 2021-03-30: qty 30, 30d supply, fill #0
  Filled 2021-03-30 – 2021-04-22 (×3): qty 30, 30d supply, fill #1
  Filled 2021-06-03: qty 30, 30d supply, fill #2

## 2021-04-04 ENCOUNTER — Other Ambulatory Visit (HOSPITAL_COMMUNITY): Payer: Self-pay

## 2021-04-05 ENCOUNTER — Ambulatory Visit (INDEPENDENT_AMBULATORY_CARE_PROVIDER_SITE_OTHER): Payer: 59 | Admitting: Professional

## 2021-04-05 ENCOUNTER — Encounter: Payer: Self-pay | Admitting: Professional

## 2021-04-05 DIAGNOSIS — F411 Generalized anxiety disorder: Secondary | ICD-10-CM

## 2021-04-05 DIAGNOSIS — Z760 Encounter for issue of repeat prescription: Secondary | ICD-10-CM | POA: Diagnosis not present

## 2021-04-05 NOTE — Progress Notes (Signed)
[facility] Counselor/Therapist Progress Note  Patient ID: [name], MRN: [id number],    Date: 04/05/2021  Time Spent: 20 minutes 01-1019 am  Treatment Type: Individual Therapy  Reported Symptoms: mood is good, always struggling with work but staying focused  Mental Status Exam: Appearance:  Neat and Well Groomed     Behavior: Sharing  Motor: Normal  Speech/Language:  Clear and Coherent and Normal Rate  Affect: Full Range  Mood: normal  Thought process: concrete  Thought content:   WNL  Sensory/Perceptual disturbances:   WNL  Orientation: oriented to person, place, time/date, and situation  Attention: Good  Concentration: Good  Memory: WNL  Fund of knowledge:  Good  Insight:   Good  Judgment:  Good  Impulse Control: Good   Risk Assessment: Danger to Self:  No Self-injurious Behavior: No Danger to Others: No Duty to Warn:no Physical Aggression / Violence:No  Access to Firearms a concern: No  Gang Involvement:No   Subjective: This session was held via video teletherapy due to the coronavirus risk at this time. The patient consented to video teletherapy and was located at her home during this session. She is aware it is the responsibility of the patient to secure confidentiality on her end of the session. The provider was in a private home office for the duration of this session.   The patient arrived on time for her webex appointment appearing nicely groomed and easily engaged.   Issues addressed: 1-lapse/relapse -educated -pt identified signs and symptoms   -irritated at work   -feeling sad   -decreased movement/exercise   -difficulty focusing   -feeling fatigue and off-centered 2-positives -pt has gotten used to being on track -reports she knows that she will internally feel different 3-grief and the holidays -doing good -usually in the holidays season she struggles   -is not as intense as in the past   -her body has been off in the  past   -had bloodwork that was off and reasoned that it was her body  Interventions: Cognitive Behavioral Therapy and Psycho-education/Bibliotherapy  Diagnosis: Generalized anxiety disorder  Plan:  Patient will call as needed for a session. Patient educated on when and how to access Clinician as needed.  Francie Massing, [facility]

## 2021-04-06 ENCOUNTER — Telehealth: Payer: Self-pay | Admitting: *Deleted

## 2021-04-06 NOTE — Telephone Encounter (Signed)
Submitted a Prior Authorization request to [facility] for  Tadalafil  via CoverMyMeds. Will update once we receive a response.

## 2021-04-07 ENCOUNTER — Other Ambulatory Visit (HOSPITAL_COMMUNITY): Payer: Self-pay

## 2021-04-11 NOTE — Telephone Encounter (Signed)
Received notification from [facility] regarding a prior authorization for  Tadalafil . Authorization has been APPROVED from 04/08/2021 to 04/07/2022.

## 2021-04-12 ENCOUNTER — Other Ambulatory Visit (HOSPITAL_COMMUNITY): Payer: Self-pay

## 2021-04-21 DIAGNOSIS — I10 Essential (primary) hypertension: Secondary | ICD-10-CM | POA: Diagnosis not present

## 2021-04-21 DIAGNOSIS — G4733 Obstructive sleep apnea (adult) (pediatric): Secondary | ICD-10-CM | POA: Diagnosis not present

## 2021-04-22 ENCOUNTER — Other Ambulatory Visit (HOSPITAL_BASED_OUTPATIENT_CLINIC_OR_DEPARTMENT_OTHER): Payer: Self-pay

## 2021-04-22 ENCOUNTER — Other Ambulatory Visit: Payer: Self-pay | Admitting: Family Medicine

## 2021-04-22 ENCOUNTER — Encounter (INDEPENDENT_AMBULATORY_CARE_PROVIDER_SITE_OTHER): Payer: Self-pay

## 2021-04-22 ENCOUNTER — Other Ambulatory Visit (HOSPITAL_COMMUNITY): Payer: Self-pay

## 2021-04-22 ENCOUNTER — Encounter: Payer: Self-pay | Admitting: Internal Medicine

## 2021-04-22 DIAGNOSIS — I10 Essential (primary) hypertension: Secondary | ICD-10-CM | POA: Diagnosis not present

## 2021-04-22 DIAGNOSIS — G4733 Obstructive sleep apnea (adult) (pediatric): Secondary | ICD-10-CM | POA: Diagnosis not present

## 2021-04-22 MED ORDER — IPRATROPIUM BROMIDE 0.06 % NA SOLN
2.0000 | Freq: Every day | NASAL | 1 refills | Status: DC
Start: 2021-04-22 — End: 2021-07-11
  Filled 2021-04-22: qty 15, 25d supply, fill #0
  Filled 2021-04-25: qty 15, 75d supply, fill #0

## 2021-04-24 ENCOUNTER — Other Ambulatory Visit (HOSPITAL_COMMUNITY): Payer: Self-pay

## 2021-04-25 ENCOUNTER — Other Ambulatory Visit (HOSPITAL_COMMUNITY): Payer: Self-pay

## 2021-04-25 ENCOUNTER — Other Ambulatory Visit (HOSPITAL_BASED_OUTPATIENT_CLINIC_OR_DEPARTMENT_OTHER): Payer: Self-pay

## 2021-04-26 ENCOUNTER — Other Ambulatory Visit: Payer: Self-pay | Admitting: Family Medicine

## 2021-04-26 ENCOUNTER — Other Ambulatory Visit: Payer: Self-pay

## 2021-04-26 ENCOUNTER — Other Ambulatory Visit (HOSPITAL_COMMUNITY): Payer: Self-pay

## 2021-04-26 ENCOUNTER — Encounter: Payer: Self-pay | Admitting: Family Medicine

## 2021-04-26 NOTE — Telephone Encounter (Signed)
Would you like Pt to continue Ergocalciferol?  

## 2021-04-26 NOTE — Telephone Encounter (Signed)
Would you like for Pt to continue Ergocalciferol?

## 2021-05-02 ENCOUNTER — Ambulatory Visit: Payer: 59 | Admitting: Family Medicine

## 2021-05-06 ENCOUNTER — Ambulatory Visit: Payer: 59 | Admitting: Rheumatology

## 2021-05-07 ENCOUNTER — Other Ambulatory Visit: Payer: Self-pay | Admitting: Family Medicine

## 2021-05-09 ENCOUNTER — Encounter: Payer: Self-pay | Admitting: Family Medicine

## 2021-05-09 ENCOUNTER — Ambulatory Visit: Payer: 59 | Admitting: Family Medicine

## 2021-05-09 ENCOUNTER — Other Ambulatory Visit: Payer: Self-pay | Admitting: Family Medicine

## 2021-05-09 ENCOUNTER — Other Ambulatory Visit (HOSPITAL_COMMUNITY): Payer: Self-pay

## 2021-05-09 VITALS — BP 118/76 | HR 94 | Temp 98.0°F | Resp 16 | Ht 65.0 in | Wt 180.6 lb

## 2021-05-09 DIAGNOSIS — E079 Disorder of thyroid, unspecified: Secondary | ICD-10-CM | POA: Diagnosis not present

## 2021-05-09 DIAGNOSIS — J329 Chronic sinusitis, unspecified: Secondary | ICD-10-CM

## 2021-05-09 DIAGNOSIS — E559 Vitamin D deficiency, unspecified: Secondary | ICD-10-CM | POA: Diagnosis not present

## 2021-05-09 DIAGNOSIS — I10 Essential (primary) hypertension: Secondary | ICD-10-CM | POA: Diagnosis not present

## 2021-05-09 DIAGNOSIS — E1169 Type 2 diabetes mellitus with other specified complication: Secondary | ICD-10-CM | POA: Diagnosis not present

## 2021-05-09 DIAGNOSIS — G4733 Obstructive sleep apnea (adult) (pediatric): Secondary | ICD-10-CM

## 2021-05-09 DIAGNOSIS — E11649 Type 2 diabetes mellitus with hypoglycemia without coma: Secondary | ICD-10-CM

## 2021-05-09 DIAGNOSIS — R748 Abnormal levels of other serum enzymes: Secondary | ICD-10-CM

## 2021-05-09 DIAGNOSIS — E782 Mixed hyperlipidemia: Secondary | ICD-10-CM

## 2021-05-09 DIAGNOSIS — R252 Cramp and spasm: Secondary | ICD-10-CM | POA: Diagnosis not present

## 2021-05-09 DIAGNOSIS — E538 Deficiency of other specified B group vitamins: Secondary | ICD-10-CM

## 2021-05-09 DIAGNOSIS — I7300 Raynaud's syndrome without gangrene: Secondary | ICD-10-CM | POA: Diagnosis not present

## 2021-05-09 DIAGNOSIS — J310 Chronic rhinitis: Secondary | ICD-10-CM

## 2021-05-09 DIAGNOSIS — E669 Obesity, unspecified: Secondary | ICD-10-CM | POA: Diagnosis not present

## 2021-05-09 DIAGNOSIS — Z789 Other specified health status: Secondary | ICD-10-CM

## 2021-05-09 DIAGNOSIS — E66811 Obesity, class 1: Secondary | ICD-10-CM

## 2021-05-09 DIAGNOSIS — E039 Hypothyroidism, unspecified: Secondary | ICD-10-CM

## 2021-05-09 LAB — MICROALBUMIN / CREATININE URINE RATIO
Creatinine,U: 193.3 mg/dL
Microalb Creat Ratio: 0.4 mg/g (ref 0.0–30.0)
Microalb, Ur: 0.8 mg/dL (ref 0.0–1.9)

## 2021-05-09 LAB — CBC
HCT: 41.2 % (ref 36.0–46.0)
Hemoglobin: 13.4 g/dL (ref 12.0–15.0)
MCHC: 32.4 g/dL (ref 30.0–36.0)
MCV: 91.7 fl (ref 78.0–100.0)
Platelets: 350 10*3/uL (ref 150.0–400.0)
RBC: 4.49 Mil/uL (ref 3.87–5.11)
RDW: 13.5 % (ref 11.5–15.5)
WBC: 3.2 10*3/uL — ABNORMAL LOW (ref 4.0–10.5)

## 2021-05-09 LAB — LIPID PANEL
Cholesterol: 102 mg/dL (ref 0–200)
HDL: 58.8 mg/dL (ref >39.00)
LDL Cholesterol: 30 mg/dL (ref 0–99)
NonHDL: 43.66
Total CHOL/HDL Ratio: 2
Triglycerides: 69 mg/dL (ref 0.0–149.0)
VLDL: 13.8 mg/dL (ref 0.0–40.0)

## 2021-05-09 LAB — COMPREHENSIVE METABOLIC PANEL
ALT: 55 U/L — ABNORMAL HIGH (ref 0–35)
AST: 38 U/L — ABNORMAL HIGH (ref 0–37)
Albumin: 4.1 g/dL (ref 3.5–5.2)
Alkaline Phosphatase: 98 U/L (ref 39–117)
BUN: 12 mg/dL (ref 6–23)
CO2: 28 mEq/L (ref 19–32)
Calcium: 9.6 mg/dL (ref 8.4–10.5)
Chloride: 105 mEq/L (ref 96–112)
Creatinine, Ser: 0.73 mg/dL (ref 0.40–1.20)
GFR: 89.37 mL/min (ref >60.00)
Glucose, Bld: 72 mg/dL (ref 70–99)
Potassium: 4.4 mEq/L (ref 3.5–5.1)
Sodium: 142 mEq/L (ref 135–145)
Total Bilirubin: 0.3 mg/dL (ref 0.2–1.2)
Total Protein: 6.6 g/dL (ref 6.0–8.3)

## 2021-05-09 LAB — TSH: TSH: 0.32 u[IU]/mL — ABNORMAL LOW (ref 0.35–5.50)

## 2021-05-09 LAB — VITAMIN D 25 HYDROXY (VIT D DEFICIENCY, FRACTURES): VITD: 70.29 ng/mL (ref 30.00–100.00)

## 2021-05-09 LAB — VITAMIN B12: Vitamin B-12: 511 pg/mL (ref 211–911)

## 2021-05-09 LAB — HEMOGLOBIN A1C: Hgb A1c MFr Bld: 5.7 % (ref 4.6–6.5)

## 2021-05-09 MED ORDER — PANTOPRAZOLE SODIUM 40 MG PO TBEC
40.0000 mg | DELAYED_RELEASE_TABLET | Freq: Every day | ORAL | 0 refills | Status: DC
  Filled 2021-05-09: qty 30, 30d supply, fill #0

## 2021-05-09 MED ORDER — SAVELLA 50 MG PO TABS
50.0000 mg | ORAL_TABLET | Freq: Two times a day (BID) | ORAL | 0 refills | Status: DC
  Filled 2021-05-09: qty 60, 30d supply, fill #0

## 2021-05-09 MED ORDER — METOPROLOL TARTRATE 25 MG PO TABS
12.5000 mg | ORAL_TABLET | Freq: Two times a day (BID) | ORAL | 0 refills | Status: DC
  Filled 2021-05-09: qty 30, 30d supply, fill #0

## 2021-05-09 MED ORDER — THYROID 90 MG PO TABS
90.0000 mg | ORAL_TABLET | Freq: Every day | ORAL | 0 refills | Status: DC
  Filled 2021-05-09: qty 30, 30d supply, fill #0

## 2021-05-09 MED ORDER — DOXYCYCLINE HYCLATE 100 MG PO TABS
100.0000 mg | ORAL_TABLET | Freq: Two times a day (BID) | ORAL | 0 refills | Status: DC
  Filled 2021-05-09: qty 14, 7d supply, fill #0

## 2021-05-09 NOTE — Assessment & Plan Note (Signed)
Using CPAP nightly 

## 2021-05-09 NOTE — Assessment & Plan Note (Signed)
Follows with endocrinology and is maintained on [place] thyroid, she has an appt soon so will update her labs today

## 2021-05-09 NOTE — Assessment & Plan Note (Signed)
Supplement and monitor 

## 2021-05-09 NOTE — Assessment & Plan Note (Signed)
Notes recent congestion and cough. Encouraged increased rest and hydration, add probiotics, zinc such as Coldeze or Xicam. Treat fevers as needed, mucinex bid and consider antibiotics if worsens.

## 2021-05-09 NOTE — Assessment & Plan Note (Signed)
Encourage heart healthy diet such as MIND or DASH diet, increase exercise, avoid trans fats, simple carbohydrates and processed foods, consider a krill or fish or flaxseed oil cap daily.  °

## 2021-05-09 NOTE — Assessment & Plan Note (Signed)
hgba1c acceptable, minimize simple carbs. Increase exercise as tolerated. Diet controlled

## 2021-05-09 NOTE — Progress Notes (Signed)
Subjective:    Patient ID: [name], female    DOB: [birth year], 61 y.o.   MRN: [id number]  Chief Complaint  Patient presents with   Follow-up    HPI Patient is in today for follow up on chronic medical concerns. No recent febrile illness or hospitalizations. She continues to try and eat well and stay active. Has had good steady weight loss since her gastric bypass. She follows with Dr Linton Flemings of endocrinology and she has a follow up appointment soon. She is noting some increased congestion and cough recently. No fevers and chills. Denies CP/palp/SOB/HA/fevers/GI or GU c/o. Taking meds as prescribed   Past Medical History:  Diagnosis Date   Acute bronchitis 05/25/2016   Anxiety    Arthritis    Chronic pain syndrome 01/06/2013   Chronic rhinosinusitis    Colon polyps    Cough 01/06/2013   Depression    Diabetes ([place]) 01/06/2013   Diabetes mellitus type 2 in obese [facility]) 01/06/2013   Sees Dr Calvert Cantor for eye exam Does not see podiatry, foot exam today unremarkable except for thick cracking skin on heals  pt denies    Dyspnea    with exertion    Dysuria 05/25/2016   Edema 01/06/2013   Epistaxis 05/25/2016   Fibroid, uterine    Fibromyalgia    GERD (gastroesophageal reflux disease)    Glaucoma 12-13   Hoarseness    Hoarseness of voice 05/11/2013   Hyperglycemia 04/13/2013   Hyperlipemia, mixed 06/06/2007   Qualifier: Diagnosis of  By: Wynona Luna She feels Lipitor caused increased low back pain and weakness     Hyperlipidemia    Hypertension    Hypokalemia 02/05/2013   IBS (irritable bowel syndrome) 02/13/2011   Low back pain 03/03/2013   Muscle cramp 05/22/2014   Obesity    Obesity, unspecified 05/11/2013   OSA (obstructive sleep apnea)    cpap    Pain in joint, shoulder region 06/27/2015   Perimenopause 10/05/2012   Pre-diabetes    Raynaud disease 06/16/2013   Thyroid disease    hypothyroidism   Vocal fold nodules     Past Surgical History:  Procedure Laterality  Date   ABDOMINAL HYSTERECTOMY     BREAST EXCISIONAL BIOPSY Right    at age 63   CHOLECYSTECTOMY     colonoscopoy      [place]     x2   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   Procedure: LAPAROSCOPIC ROUX-EN-Y GASTRIC BYPASS WITH UPPER ENDOSCOPY;  Surgeon: [name], MD;  Location: [facility];  Service: General;  Laterality: N/A;   I & D EXTREMITY Left 04/11/2018   Procedure: DEBIDMENT DISTAL INTERPHALANGEAL LEFT MIDDLE;  Surgeon: Daryll Brod, MD;  Location: [facility];  Service: Orthopedics;  Laterality: Left;   INCISIONAL HERNIA REPAIR  08/10/2020   Procedure: LAPAROSCOPIC PRIMARY REPAIR OF INCISIONAL HERNIA;  Surgeon: [name], MD;  Location: [facility];  Service: General;;   MASS EXCISION Left 04/11/2018   Procedure: EXCISION MASS;  Surgeon: Daryll Brod, MD;  Location: [facility];  Service: Orthopedics;  Laterality: Left;   OOPHORECTOMY     POLYPECTOMY     ROTATOR CUFF REPAIR     UPPER GI ENDOSCOPY N/A 08/10/2020   Procedure: UPPER GI ENDOSCOPY;  Surgeon: [name], MD;  Location: [facility];  Service: General;  Laterality: N/A;    Family History  Problem Relation Age of Onset  Alcohol abuse Father    Cancer Father        renal and colon   Hyperlipidemia Father    Hypertension Father    Stroke Father    Heart disease Father    Cancer Paternal Grandfather        colon   Diabetes Other    High blood pressure Mother    High Cholesterol Mother    Kidney disease Mother    Depression Mother    Anxiety disorder Mother    Obesity Mother    Breast cancer Other 64   Diabetes Sister    Diabetes Brother     Social History   Socioeconomic History   Marital status: Significant Other    Spouse name: Not on file   Number of children: 0   Years of education: Not on file   Highest education level: Not on file  Occupational History   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  Tobacco Use   Smoking status: Former     Packs/day: 0.75    Years: 42.00    Pack years: 31.50    Types: Cigarettes    Quit date: 04/24/2005    Years since quitting: 16.0   Smokeless tobacco: Never   Tobacco comments:    smoked since age 83  Vaping Use   Vaping Use: Never used  Substance and Sexual Activity   Alcohol use: No   Drug use: Never   Sexual activity: Yes    Partners: Male  Other Topics Concern   Not on file  Social History Narrative   Endo-- Dr Dwyane Dee   ENT--Dr shoemaker   GI--Dr Edgewood   Pulm--Dr Clance   Rheum--Dr Charlestine Night         Social Determinants of Health   Financial Resource Strain: Not on file  Food Insecurity: Not on file  Transportation Needs: Not on file  Physical Activity: Not on file  Stress: Not on file  Social Connections: Not on file  Intimate Partner Violence: Not on file    Outpatient Medications Prior to Visit  Medication Sig Dispense Refill   busPIRone (BUSPAR) 7.5 MG tablet Take 1 tablet (7.5 mg total) by mouth 3 (three) times daily. 90 tablet 2   Calcium Carbonate 500 MG CHEW Chew 500 mg by mouth daily at 12 noon.     diphenhydrAMINE (BENADRYL) 25 MG tablet Take 25 mg by mouth every 6 (six) hours as needed for allergies or itching.     Evolocumab 140 MG/ML SOAJ INJECT 1 DOSE INTO THE SKIN EVERY 14 (FOURTEEN) DAYS. (Patient taking differently: Inject 140 mg into the skin every 14 (fourteen) days.) 6 mL 3   furosemide (LASIX) 20 MG tablet Take 1 tablet (20 mg total) by mouth daily as needed. 90 tablet 1   gabapentin (NEURONTIN) 100 MG capsule Take 1-3 capsules (100-300 mg total) by mouth 3 (three) times daily as needed. (Patient taking differently: Take 300 mg by mouth at bedtime.) 90 capsule 3   ipratropium (ATROVENT) 0.06 % nasal spray Place 2 sprays into both nostrils 3 (three) times daily as needed for rhinitis 15 mL 12   ipratropium (ATROVENT) 0.06 % nasal spray Place 2 sprays into the nose daily. 15 mL 1   loratadine (CLARITIN) 10 MG tablet Take 20 mg by mouth daily.      lubiprostone (AMITIZA) 24 MCG capsule Take 1 capsule (24 mcg total) by mouth 2 (two) times daily with food and water 60 capsule 0   metoprolol tartrate (LOPRESSOR) 25  MG tablet Take 0.5 tablets (12.5 mg total) by mouth 2 (two) times daily. 30 tablet 0   Milnacipran (SAVELLA) 50 MG TABS tablet Take 1 tablet (50 mg total) by mouth 2 (two) times daily. 60 tablet 0   modafinil (PROVIGIL) 200 MG tablet Take 2 tablets (400 mg total) by mouth daily. 60 tablet 5   Multiple Vitamins-Minerals (MULTIVITAMIN WITH MINERALS) tablet Take 1 tablet by mouth daily.     NIFEdipine (PROCARDIA-XL/NIFEDICAL-XL) 30 MG 24 hr tablet Take 1 tablet (30 mg total) by mouth daily. 30 tablet 3   pantoprazole (PROTONIX) 40 MG tablet Take 1 tablet (40 mg total) by mouth daily. 30 tablet 0   spironolactone (ALDACTONE) 50 MG tablet Take 1 tablet (50 mg total) by mouth 3 (three) times daily. 270 tablet 3   tadalafil (CIALIS) 5 MG tablet Take 1 tablet (5 mg total) by mouth daily. 30 tablet 2   thyroid (NP THYROID) 90 MG tablet Take 1 tablet (90 mg total) by mouth daily. 30 tablet 0   albuterol (VENTOLIN HFA) 108 (90 Base) MCG/ACT inhaler INHALE 2 PUFFS INTO THE LUNGS EVERY 4 (FOUR) HOURS AS NEEDED FOR WHEEZING OR SHORTNESS OF BREATH. 18 g 5   Vitamin D, Ergocalciferol, (DRISDOL) 1.25 MG (50000 UNIT) CAPS capsule Take 1 capsule (50,000 Units total) by mouth every 7 (seven) days. 12 capsule 1   hydrocortisone 1 mg/4 mLs in nystatin suspension-diphenhydrAMINE liquid Swish and Spit 5 mL by mouth 4 (four) times daily as needed for mouth pain. (Patient not taking: Reported on 03/16/2021) 240 mL 1   Facility-Administered Medications Prior to Visit  Medication Dose Route Frequency Provider Last Rate Last Admin   NIFEdipine (PROCARDIA-XL/NIFEDICAL-XL) 24 hr tablet 30 mg  30 mg Oral Daily Broadus John, MD        Allergies  Allergen Reactions   Crestor [Rosuvastatin] Other (See Comments)    Myalgias and rising CPK   Losartan Cough    Wellbutrin [Bupropion] Other (See Comments)    Dizziness and mental status changes   Atorvastatin     myalgias   Simvastatin     NDC Code:16714068101 NDC Code:[id number] NDC NWGN:[id number]   Amoxicillin Rash   Aspirin Nausea Only and Other (See Comments)    GI upset REACTION: Upset stomach   Codeine Rash   Erythromycin Rash and Other (See Comments)   Penicillins Rash    Reaction: 15 years    Review of Systems  Constitutional:  Negative for fever and malaise/fatigue.  HENT:  Positive for congestion.   Eyes:  Negative for blurred vision.  Respiratory:  Positive for cough and sputum production. Negative for shortness of breath.   Cardiovascular:  Negative for chest pain, palpitations and leg swelling.  Gastrointestinal:  Negative for abdominal pain, blood in stool and nausea.  Genitourinary:  Negative for dysuria and frequency.  Musculoskeletal:  Negative for falls.  Skin:  Negative for rash.  Neurological:  Negative for dizziness, loss of consciousness and headaches.  Endo/Heme/Allergies:  Negative for environmental allergies.  Psychiatric/Behavioral:  Negative for depression. The patient is not nervous/anxious.       Objective:    Physical Exam Constitutional:      General: She is not in acute distress.    Appearance: She is well-developed.  HENT:     Head: Normocephalic and atraumatic.  Eyes:     Conjunctiva/sclera: Conjunctivae normal.  Neck:     Thyroid: No thyromegaly.  Cardiovascular:     Rate and Rhythm: Normal rate and  regular rhythm.     Heart sounds: Normal heart sounds. No murmur heard. Pulmonary:     Effort: Pulmonary effort is normal. No respiratory distress.     Breath sounds: Normal breath sounds.  Abdominal:     General: Bowel sounds are normal. There is no distension.     Palpations: Abdomen is soft. There is no mass.     Tenderness: There is no abdominal tenderness.  Musculoskeletal:     Cervical back: Neck supple.  Lymphadenopathy:      Cervical: No cervical adenopathy.  Skin:    General: Skin is warm and dry.  Neurological:     Mental Status: She is alert and oriented to person, place, and time.  Psychiatric:        Behavior: Behavior normal.    BP 118/76    Pulse 94    Temp 98 F (36.7 C)    Resp 16    Ht _0  (1.651 m)    Wt 180 lb 9.6 oz (81.9 kg)    SpO2 99%    BMI 30.05 kg/m  Wt Readings from Last 3 Encounters:  05/09/21 180 lb 9.6 oz (81.9 kg)  03/30/21 185 lb (83.9 kg)  03/16/21 185 lb 8 oz (84.1 kg)    Diabetic Foot Exam - Simple   No data filed    Lab Results  Component Value Date   WBC 3.2 (L) 05/09/2021   HGB 13.4 05/09/2021   HCT 41.2 05/09/2021   PLT 350.0 05/09/2021   GLUCOSE 72 05/09/2021   CHOL 102 05/09/2021   TRIG 69.0 05/09/2021   HDL 58.80 05/09/2021   LDLDIRECT 159.0 11/11/2019   LDLCALC 30 05/09/2021   ALT 55 (H) 05/09/2021   AST 38 (H) 05/09/2021   NA 142 05/09/2021   K 4.4 05/09/2021   CL 105 05/09/2021   CREATININE 0.73 05/09/2021   BUN 12 05/09/2021   CO2 28 05/09/2021   TSH 0.32 (L) 05/09/2021   HGBA1C 5.7 05/09/2021   MICROALBUR 0.8 05/09/2021    Lab Results  Component Value Date   TSH 0.32 (L) 05/09/2021   Lab Results  Component Value Date   WBC 3.2 (L) 05/09/2021   HGB 13.4 05/09/2021   HCT 41.2 05/09/2021   MCV 91.7 05/09/2021   PLT 350.0 05/09/2021   Lab Results  Component Value Date   NA 142 05/09/2021   K 4.4 05/09/2021   CO2 28 05/09/2021   GLUCOSE 72 05/09/2021   BUN 12 05/09/2021   CREATININE 0.73 05/09/2021   BILITOT 0.3 05/09/2021   ALKPHOS 98 05/09/2021   AST 38 (H) 05/09/2021   ALT 55 (H) 05/09/2021   PROT 6.6 05/09/2021   ALBUMIN 4.1 05/09/2021   CALCIUM 9.6 05/09/2021   ANIONGAP 7 08/16/2020   EGFR 58 (L) 06/22/2020   GFR 89.37 05/09/2021   Lab Results  Component Value Date   CHOL 102 05/09/2021   Lab Results  Component Value Date   HDL 58.80 05/09/2021   Lab Results  Component Value Date   LDLCALC 30 05/09/2021    Lab Results  Component Value Date   TRIG 69.0 05/09/2021   Lab Results  Component Value Date   CHOLHDL 2 05/09/2021   Lab Results  Component Value Date   HGBA1C 5.7 05/09/2021       Assessment & Plan:   Problem List Items Addressed This Visit     Hypothyroidism    Follows with endocrinology and is maintained on [place] thyroid,  she has an appt soon so will update her labs today      Hyperlipemia, mixed    Encourage heart healthy diet such as MIND or DASH diet, increase exercise, avoid trans fats, simple carbohydrates and processed foods, consider a krill or fish or flaxseed oil cap daily.       Relevant Orders   Lipid panel (Completed)   OSA (obstructive sleep apnea)    Using CPAP nightly      Vitamin D deficiency    Supplement and monitor, she is interested in 50000 IU weekly will check level today      Relevant Orders   VITAMIN D 25 Hydroxy (Vit-D Deficiency, Fractures) (Completed)   Diabetes mellitus type 2 in obese (HCC)    hgba1c acceptable, minimize simple carbs. Increase exercise as tolerated. Continue current meds      Relevant Orders   Hemoglobin A1c (Completed)   Microalbumin / creatinine urine ratio (Completed)   Muscle cramp    Hydrate and monitor      Obesity (BMI 30.0-34.9)    Great weight loss s/p surgery maintain heart healthy diet continue decreased po intake and increase exercise as tolerated. Needs 7-8 hours of sleep nightly. Avoid trans fats, eat small, frequent meals every 4-5 hours with lean proteins, complex carbs and healthy fats. Minimize simple carbs, high fat foods and processed foods      Elevated CK    Hydrate and monitor      Statin intolerance    Unable to tolerate statins.       Thyroid disease    Supplement and monitor      Hypertension    Well controlled, no changes to meds. Encouraged heart healthy diet such as the DASH diet and exercise as tolerated.       Relevant Orders   CBC (Completed)   Comprehensive  metabolic panel (Completed)   TSH (Completed)   Diabetes (HCC)    hgba1c acceptable, minimize simple carbs. Increase exercise as tolerated. Diet controlled      Raynaud's phenomenon    Hands and feet sore and she is seeing Rheumatology Dr Benjamine Mola they have her on Cialis 5 mg daily, Procardia 30 mg daily. Not feeling much of a difference after 2 months      Vitamin B12 deficiency    Supplement and monitor      RESOLVED: Morbid obesity ([place])    Great weight loss after surgery      Other Visit Diagnoses     Vitamin B 12 deficiency    -  Primary   Relevant Orders   Vitamin B12 (Completed)       I have discontinued Yanelli S. Haze's hydrocortisone 1 mg/4 mLs in nystatin suspension-diphenhydrAMINE liquid. I am also having her maintain her loratadine, diphenhydrAMINE, multivitamin with minerals, Calcium Carbonate, Evolocumab, albuterol, spironolactone, Vitamin D (Ergocalciferol), gabapentin, modafinil, ipratropium, NIFEdipine, furosemide, busPIRone, lubiprostone, tadalafil, ipratropium, Savella, pantoprazole, thyroid, and metoprolol tartrate. We will continue to administer NIFEdipine.  No orders of the defined types were placed in this encounter.    Penni Homans, MD

## 2021-05-09 NOTE — Assessment & Plan Note (Signed)
Great weight loss s/p surgery maintain heart healthy diet continue decreased po intake and increase exercise as tolerated. Needs 7-8 hours of sleep nightly. Avoid trans fats, eat small, frequent meals every 4-5 hours with lean proteins, complex carbs and healthy fats. Minimize simple carbs, high fat foods and processed foods

## 2021-05-09 NOTE — Assessment & Plan Note (Signed)
Unable to tolerate statins 

## 2021-05-09 NOTE — Patient Instructions (Signed)
High Cholesterol °High cholesterol is a condition in which the blood has high levels of a white, waxy substance similar to fat (cholesterol). The liver makes all the cholesterol that the body needs. The human body needs small amounts of cholesterol to help build cells. A person gets extra or excess cholesterol from the food that he or she eats. °The blood carries cholesterol from the liver to the rest of the body. If you have high cholesterol, deposits (plaques) may build up on the walls of your arteries. Arteries are the blood vessels that carry blood away from your heart. These plaques make the arteries narrow and stiff. °Cholesterol plaques increase your risk for heart attack and stroke. Work with your health care provider to keep your cholesterol levels in a healthy range. °What increases the risk? °The following factors may make you more likely to develop this condition: °Eating foods that are high in animal fat (saturated fat) or cholesterol. °Being overweight. °Not getting enough exercise. °A family history of high cholesterol (familial hypercholesterolemia). °Use of tobacco products. °Having diabetes. °What are the signs or symptoms? °In most cases, high cholesterol does not usually cause any symptoms. °In severe cases, very high cholesterol levels can cause: °Fatty bumps under the skin (xanthomas). °A white or gray ring around the black center (pupil) of the eye. °How is this diagnosed? °This condition may be diagnosed based on the results of a blood test. °If you are older than 61 years of age, your health care provider may check your cholesterol levels every 4-6 years. °You may be checked more often if you have high cholesterol or other risk factors for heart disease. °The blood test for cholesterol measures: °"Bad" cholesterol, or LDL cholesterol. This is the main type of cholesterol that causes heart disease. The desired level is less than 100 mg/dL (2.59 mmol/L). °"Good" cholesterol, or HDL  cholesterol. HDL helps protect against heart disease by cleaning the arteries and carrying the LDL to the liver for processing. The desired level for HDL is 60 mg/dL (1.55 mmol/L) or higher. °Triglycerides. These are fats that your body can store or burn for energy. The desired level is less than 150 mg/dL (1.69 mmol/L). °Total cholesterol. This measures the total amount of cholesterol in your blood and includes LDL, HDL, and triglycerides. The desired level is less than 200 mg/dL (5.17 mmol/L). °How is this treated? °Treatment for high cholesterol starts with lifestyle changes, such as diet and exercise. °Diet changes. You may be asked to eat foods that have more fiber and less saturated fats or added sugar. °Lifestyle changes. These may include regular exercise, maintaining a healthy weight, and quitting use of tobacco products. °Medicines. These are given when diet and lifestyle changes have not worked. You may be prescribed a statin medicine to help lower your cholesterol levels. °Follow these instructions at home: °Eating and drinking ° °Eat a healthy, balanced diet. This diet includes: °Daily servings of a variety of fresh, frozen, or canned fruits and vegetables. °Daily servings of whole grain foods that are rich in fiber. °Foods that are low in saturated fats and trans fats. These include poultry and fish without skin, lean cuts of meat, and low-fat dairy products. °A variety of fish, especially oily fish that contain omega-3 fatty acids. Aim to eat fish at least 2 times a week. °Avoid foods and drinks that have added sugar. °Use healthy cooking methods, such as roasting, grilling, broiling, baking, poaching, steaming, and stir-frying. Do not fry your food except for stir-frying. °  If you drink alcohol: °Limit how much you have to: °0-1 drink a day for women who are not pregnant. °0-2 drinks a day for men. °Know how much alcohol is in a drink. In the [place] one drink equals one 12 oz bottle of beer (355 mL),  one 5 oz glass of wine (148 mL), or one 1½ oz glass of hard liquor (44 mL). °Lifestyle ° °Get regular exercise. Aim to exercise for a total of 150 minutes a week. Increase your activity level by doing activities such as gardening, walking, and taking the stairs. °Do not use any products that contain nicotine or tobacco. These products include cigarettes, chewing tobacco, and vaping devices, such as e-cigarettes. If you need help quitting, ask your health care provider. °General instructions °Take over-the-counter and prescription medicines only as told by your health care provider. °Keep all follow-up visits. This is important. °Where to find more information °American Heart Association: www.heart.org °National Heart, Lung, and Blood Institute: www.nhlbi.nih.gov °Contact a health care provider if: °You have trouble achieving or maintaining a healthy diet or weight. °You are starting an exercise program. °You are unable to stop smoking. °Get help right away if: °You have chest pain. °You have trouble breathing. °You have discomfort or pain in your jaw, neck, back, shoulder, or arm. °You have any symptoms of a stroke. "BE FAST" is an easy way to remember the main warning signs of a stroke: °B - Balance. Signs are dizziness, sudden trouble walking, or loss of balance. °E - Eyes. Signs are trouble seeing or a sudden change in vision. °F - Face. Signs are sudden weakness or numbness of the face, or the face or eyelid drooping on one side. °A - Arms. Signs are weakness or numbness in an arm. This happens suddenly and usually on one side of the body. °S - Speech. Signs are sudden trouble speaking, slurred speech, or trouble understanding what people say. °T - Time. Time to call emergency services. Write down what time symptoms started. °You have other signs of a stroke, such as: °A sudden, severe headache with no known cause. °Nausea or vomiting. °Seizure. °These symptoms may represent a serious problem that is an  emergency. Do not wait to see if the symptoms will go away. Get medical help right away. Call your local emergency services (911 in the [place] Do not drive yourself to the hospital. °Summary °Cholesterol plaques increase your risk for heart attack and stroke. Work with your health care provider to keep your cholesterol levels in a healthy range. °Eat a healthy, balanced diet, get regular exercise, and maintain a healthy weight. °Do not use any products that contain nicotine or tobacco. These products include cigarettes, chewing tobacco, and vaping devices, such as e-cigarettes. °Get help right away if you have any symptoms of a stroke. °This information is not intended to replace advice given to you by your health care provider. Make sure you discuss any questions you have with your health care provider. °Document Revised: 06/24/2020 Document Reviewed: 06/14/2020 °Elsevier Patient Education © 2022 [employer] ° °

## 2021-05-09 NOTE — Assessment & Plan Note (Signed)
Hydrate and monitor 

## 2021-05-09 NOTE — Assessment & Plan Note (Signed)
Great weight loss after surgery

## 2021-05-09 NOTE — Assessment & Plan Note (Addendum)
Hands and feet sore and she is seeing Rheumatology Dr Benjamine Mola they have her on Cialis 5 mg daily, Procardia 30 mg daily. Not feeling much of a difference after 2 months

## 2021-05-09 NOTE — Assessment & Plan Note (Signed)
Supplement and monitor, she is interested in 50000 IU weekly will check level today

## 2021-05-09 NOTE — Assessment & Plan Note (Signed)
hgba1c acceptable, minimize simple carbs. Increase exercise as tolerated. Continue current meds 

## 2021-05-09 NOTE — Assessment & Plan Note (Signed)
Well controlled, no changes to meds. Encouraged heart healthy diet such as the DASH diet and exercise as tolerated.  °

## 2021-05-10 ENCOUNTER — Other Ambulatory Visit (HOSPITAL_COMMUNITY): Payer: Self-pay

## 2021-05-20 ENCOUNTER — Ambulatory Visit: Payer: 59 | Admitting: Rheumatology

## 2021-05-22 DIAGNOSIS — G4733 Obstructive sleep apnea (adult) (pediatric): Secondary | ICD-10-CM | POA: Diagnosis not present

## 2021-05-22 DIAGNOSIS — I10 Essential (primary) hypertension: Secondary | ICD-10-CM | POA: Diagnosis not present

## 2021-05-23 DIAGNOSIS — M9903 Segmental and somatic dysfunction of lumbar region: Secondary | ICD-10-CM | POA: Diagnosis not present

## 2021-05-23 DIAGNOSIS — M5417 Radiculopathy, lumbosacral region: Secondary | ICD-10-CM | POA: Diagnosis not present

## 2021-05-23 DIAGNOSIS — M5442 Lumbago with sciatica, left side: Secondary | ICD-10-CM | POA: Diagnosis not present

## 2021-05-23 DIAGNOSIS — M62830 Muscle spasm of back: Secondary | ICD-10-CM | POA: Diagnosis not present

## 2021-05-23 DIAGNOSIS — M9901 Segmental and somatic dysfunction of cervical region: Secondary | ICD-10-CM | POA: Diagnosis not present

## 2021-05-23 DIAGNOSIS — M9905 Segmental and somatic dysfunction of pelvic region: Secondary | ICD-10-CM | POA: Diagnosis not present

## 2021-05-23 DIAGNOSIS — M5032 Other cervical disc degeneration, mid-cervical region, unspecified level: Secondary | ICD-10-CM | POA: Diagnosis not present

## 2021-05-23 DIAGNOSIS — M9902 Segmental and somatic dysfunction of thoracic region: Secondary | ICD-10-CM | POA: Diagnosis not present

## 2021-05-23 DIAGNOSIS — M5137 Other intervertebral disc degeneration, lumbosacral region: Secondary | ICD-10-CM | POA: Diagnosis not present

## 2021-05-25 ENCOUNTER — Other Ambulatory Visit (HOSPITAL_COMMUNITY): Payer: Self-pay

## 2021-05-25 DIAGNOSIS — E042 Nontoxic multinodular goiter: Secondary | ICD-10-CM | POA: Diagnosis not present

## 2021-05-25 DIAGNOSIS — E039 Hypothyroidism, unspecified: Secondary | ICD-10-CM | POA: Diagnosis not present

## 2021-05-25 MED ORDER — THYROID 90 MG PO TABS
90.0000 mg | ORAL_TABLET | Freq: Every day | ORAL | 4 refills | Status: DC
  Filled 2021-05-25 – 2021-06-13 (×2): qty 90, 90d supply, fill #0

## 2021-05-27 ENCOUNTER — Other Ambulatory Visit: Payer: Self-pay | Admitting: Internal Medicine

## 2021-05-30 ENCOUNTER — Other Ambulatory Visit (HOSPITAL_COMMUNITY): Payer: Self-pay

## 2021-05-30 MED ORDER — REPATHA SURECLICK 140 MG/ML ~~LOC~~ SOAJ
140.0000 mg | SUBCUTANEOUS | 0 refills | Status: DC
  Filled 2021-05-30: qty 6, 84d supply, fill #0

## 2021-05-30 NOTE — Telephone Encounter (Signed)
Rx(s) sent to pharmacy electronically.  

## 2021-05-31 ENCOUNTER — Other Ambulatory Visit (HOSPITAL_COMMUNITY): Payer: Self-pay

## 2021-05-31 DIAGNOSIS — E042 Nontoxic multinodular goiter: Secondary | ICD-10-CM | POA: Diagnosis not present

## 2021-06-01 DIAGNOSIS — Z6829 Body mass index (BMI) 29.0-29.9, adult: Secondary | ICD-10-CM | POA: Diagnosis not present

## 2021-06-01 DIAGNOSIS — Z01419 Encounter for gynecological examination (general) (routine) without abnormal findings: Secondary | ICD-10-CM | POA: Diagnosis not present

## 2021-06-02 ENCOUNTER — Other Ambulatory Visit (HOSPITAL_COMMUNITY): Payer: Self-pay

## 2021-06-03 ENCOUNTER — Other Ambulatory Visit: Payer: Self-pay | Admitting: Family Medicine

## 2021-06-03 ENCOUNTER — Other Ambulatory Visit (HOSPITAL_COMMUNITY): Payer: Self-pay

## 2021-06-03 MED ORDER — SAVELLA 50 MG PO TABS
50.0000 mg | ORAL_TABLET | Freq: Two times a day (BID) | ORAL | 0 refills | Status: DC
  Filled 2021-06-03: qty 60, 30d supply, fill #0

## 2021-06-03 MED ORDER — METOPROLOL TARTRATE 25 MG PO TABS
12.5000 mg | ORAL_TABLET | Freq: Two times a day (BID) | ORAL | 0 refills | Status: DC
  Filled 2021-06-03: qty 30, 30d supply, fill #0

## 2021-06-05 ENCOUNTER — Other Ambulatory Visit: Payer: Self-pay | Admitting: Family Medicine

## 2021-06-06 ENCOUNTER — Other Ambulatory Visit (HOSPITAL_COMMUNITY): Payer: Self-pay

## 2021-06-06 MED ORDER — PANTOPRAZOLE SODIUM 40 MG PO TBEC
40.0000 mg | DELAYED_RELEASE_TABLET | Freq: Every day | ORAL | 0 refills | Status: DC
Start: 2021-06-06 — End: 2021-08-23
  Filled 2021-06-06: qty 90, 90d supply, fill #0

## 2021-06-08 ENCOUNTER — Other Ambulatory Visit (HOSPITAL_COMMUNITY): Payer: Self-pay | Admitting: Psychiatry

## 2021-06-09 ENCOUNTER — Other Ambulatory Visit (HOSPITAL_COMMUNITY): Payer: Self-pay

## 2021-06-09 MED ORDER — BUSPIRONE HCL 7.5 MG PO TABS
7.5000 mg | ORAL_TABLET | Freq: Three times a day (TID) | ORAL | 2 refills | Status: DC
  Filled 2021-06-09: qty 90, 30d supply, fill #0
  Filled 2021-07-19: qty 90, 30d supply, fill #1
  Filled 2021-08-20: qty 90, 30d supply, fill #2

## 2021-06-13 ENCOUNTER — Other Ambulatory Visit (HOSPITAL_COMMUNITY): Payer: Self-pay

## 2021-06-20 DIAGNOSIS — M5417 Radiculopathy, lumbosacral region: Secondary | ICD-10-CM | POA: Diagnosis not present

## 2021-06-20 DIAGNOSIS — M9902 Segmental and somatic dysfunction of thoracic region: Secondary | ICD-10-CM | POA: Diagnosis not present

## 2021-06-20 DIAGNOSIS — M5137 Other intervertebral disc degeneration, lumbosacral region: Secondary | ICD-10-CM | POA: Diagnosis not present

## 2021-06-20 DIAGNOSIS — M9905 Segmental and somatic dysfunction of pelvic region: Secondary | ICD-10-CM | POA: Diagnosis not present

## 2021-06-20 DIAGNOSIS — M9901 Segmental and somatic dysfunction of cervical region: Secondary | ICD-10-CM | POA: Diagnosis not present

## 2021-06-20 DIAGNOSIS — M5032 Other cervical disc degeneration, mid-cervical region, unspecified level: Secondary | ICD-10-CM | POA: Diagnosis not present

## 2021-06-20 DIAGNOSIS — M9903 Segmental and somatic dysfunction of lumbar region: Secondary | ICD-10-CM | POA: Diagnosis not present

## 2021-06-20 DIAGNOSIS — M5442 Lumbago with sciatica, left side: Secondary | ICD-10-CM | POA: Diagnosis not present

## 2021-06-20 DIAGNOSIS — M62830 Muscle spasm of back: Secondary | ICD-10-CM | POA: Diagnosis not present

## 2021-06-21 DIAGNOSIS — I10 Essential (primary) hypertension: Secondary | ICD-10-CM | POA: Diagnosis not present

## 2021-06-21 DIAGNOSIS — G4733 Obstructive sleep apnea (adult) (pediatric): Secondary | ICD-10-CM | POA: Diagnosis not present

## 2021-06-23 ENCOUNTER — Encounter: Payer: Self-pay | Admitting: Family Medicine

## 2021-06-23 NOTE — Telephone Encounter (Signed)
Pt scheduled for 06/24/21 with Lovena Le. ?

## 2021-06-24 ENCOUNTER — Encounter: Payer: Self-pay | Admitting: Family Medicine

## 2021-06-24 ENCOUNTER — Ambulatory Visit: Payer: 59 | Admitting: Family Medicine

## 2021-06-24 VITALS — BP 123/87 | HR 110 | Ht 65.0 in | Wt 181.2 lb

## 2021-06-24 DIAGNOSIS — R319 Hematuria, unspecified: Secondary | ICD-10-CM

## 2021-06-24 LAB — POC URINALSYSI DIPSTICK (AUTOMATED)
Glucose, UA: NEGATIVE
Ketones, UA: NEGATIVE
Leukocytes, UA: NEGATIVE
Nitrite, UA: NEGATIVE
Protein, UA: POSITIVE — AB
Spec Grav, UA: 1.015 (ref 1.010–1.025)
Urobilinogen, UA: 0.2 E.U./dL
pH, UA: 7 (ref 5.0–8.0)

## 2021-06-24 LAB — CBC WITH DIFFERENTIAL/PLATELET
Basophils Absolute: 0.1 10*3/uL (ref 0.0–0.1)
Basophils Relative: 1.4 % (ref 0.0–3.0)
Eosinophils Absolute: 0 10*3/uL (ref 0.0–0.7)
Eosinophils Relative: 0.8 % (ref 0.0–5.0)
HCT: 39.1 % (ref 36.0–46.0)
Hemoglobin: 13 g/dL (ref 12.0–15.0)
Lymphocytes Relative: 30.4 % (ref 12.0–46.0)
Lymphs Abs: 1.3 10*3/uL (ref 0.7–4.0)
MCHC: 33.1 g/dL (ref 30.0–36.0)
MCV: 90.8 fl (ref 78.0–100.0)
Monocytes Absolute: 0.3 10*3/uL (ref 0.1–1.0)
Monocytes Relative: 5.7 % (ref 3.0–12.0)
Neutro Abs: 2.7 10*3/uL (ref 1.4–7.7)
Neutrophils Relative %: 61.7 % (ref 43.0–77.0)
Platelets: 298 10*3/uL (ref 150.0–400.0)
RBC: 4.31 Mil/uL (ref 3.87–5.11)
RDW: 13.5 % (ref 11.5–15.5)
WBC: 4.4 10*3/uL (ref 4.0–10.5)

## 2021-06-24 LAB — COMPREHENSIVE METABOLIC PANEL
ALT: 81 U/L — ABNORMAL HIGH (ref 0–35)
AST: 52 U/L — ABNORMAL HIGH (ref 0–37)
Albumin: 4.3 g/dL (ref 3.5–5.2)
Alkaline Phosphatase: 85 U/L (ref 39–117)
BUN: 12 mg/dL (ref 6–23)
CO2: 29 mEq/L (ref 19–32)
Calcium: 9.6 mg/dL (ref 8.4–10.5)
Chloride: 103 mEq/L (ref 96–112)
Creatinine, Ser: 0.76 mg/dL (ref 0.40–1.20)
GFR: 85.08 mL/min (ref >60.00)
Glucose, Bld: 85 mg/dL (ref 70–99)
Potassium: 4 mEq/L (ref 3.5–5.1)
Sodium: 141 mEq/L (ref 135–145)
Total Bilirubin: 0.5 mg/dL (ref 0.2–1.2)
Total Protein: 6.5 g/dL (ref 6.0–8.3)

## 2021-06-24 LAB — CK: Total CK: 203 U/L — ABNORMAL HIGH (ref 7–177)

## 2021-06-24 LAB — URINALYSIS, MICROSCOPIC ONLY

## 2021-06-24 NOTE — Progress Notes (Signed)
° °Acute Office Visit ° °Subjective:  ° ° Patient ID: [name], female    DOB: 07/09/1960, 60 y.o.   MRN: [id number] ° °CC: dark urine ° ° °HPI °Patient is in today for urine changes. ° °Off-and-on for the past 3 months or so patient has noticed her urine occasionally gets very dark.  States she has just been keeping an eye on it however last week it gradually started looking more consistently darker and yesterday seemed very significantly different.  She states her urine is  orange to brown colors at times whereas previously her urine will be clear/straw-colored.  Reports that she had weight loss surgery last April and began the BELT exercise program at [facility]/June and transitioned to their HOPE program in December.  States that with this program she does group and individualized exercises with a personal trainer every Monday/Wednesday/Friday.  States she typically starts her day with about 10 minutes on a stair climber followed by group or individual exercises.  She denies any recent increase in intensity of workouts.  States she drinks at least 60 ounces of water every day.  She denies any dysuria, diarrhea, flank pain, back pain, abdominal pain, fevers, nausea, vomiting, fatigue, muscle aches/pains.  Reports she does have occasional constipation but this is baseline for her and she can manage it well over-the-counter. ° ° ° ° °Past Medical History:  °Diagnosis Date  ° Acute bronchitis 05/25/2016  ° Anxiety   ° Arthritis   ° Chronic pain syndrome 01/06/2013  ° Chronic rhinosinusitis   ° Colon polyps   ° Cough 01/06/2013  ° Depression   ° Diabetes (HCC) 01/06/2013  ° Diabetes mellitus type 2 in obese (HCC) 01/06/2013  ° Sees Dr Donald Digby for eye exam Does not see podiatry, foot exam today unremarkable except for thick cracking skin on heals  pt denies   ° Dyspnea   ° with exertion   ° Dysuria 05/25/2016  ° Edema 01/06/2013  ° Epistaxis 05/25/2016  ° Fibroid, uterine   ° Fibromyalgia   ° GERD (gastroesophageal reflux  disease)   ° Glaucoma 12-13  ° Hoarseness   ° Hoarseness of voice 05/11/2013  ° Hyperglycemia 04/13/2013  ° Hyperlipemia, mixed 06/06/2007  ° Qualifier: Diagnosis of  By: Yoo DO, D. Robert She feels Lipitor caused increased low back pain and weakness    ° Hyperlipidemia   ° Hypertension   ° Hypokalemia 02/05/2013  ° IBS (irritable bowel syndrome) 02/13/2011  ° Low back pain 03/03/2013  ° Muscle cramp 05/22/2014  ° Obesity   ° Obesity, unspecified 05/11/2013  ° OSA (obstructive sleep apnea)   ° cpap   ° Pain in joint, shoulder region 06/27/2015  ° Perimenopause 10/05/2012  ° Pre-diabetes   ° Raynaud disease 06/16/2013  ° Thyroid disease   ° hypothyroidism  ° Vocal fold nodules   ° ° °Past Surgical History:  °Procedure Laterality Date  ° ABDOMINAL HYSTERECTOMY    ° BREAST EXCISIONAL BIOPSY Right   ° at age 13  ° CHOLECYSTECTOMY    ° colonoscopoy     ° CYSTECTOMY    ° DILATION AND CURETTAGE OF UTERUS    ° x2  ° 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  ° Procedure: LAPAROSCOPIC ROUX-EN-Y GASTRIC BYPASS WITH UPPER ENDOSCOPY;  Surgeon: Wilson, Eric, MD;  Location: [facility];  Service: General;  Laterality: N/A;  ° I & D EXTREMITY Left 04/11/2018  ° Procedure: DEBIDMENT DISTAL INTERPHALANGEAL LEFT MIDDLE;  Surgeon: Kuzma, Gary, MD;  Location: [facility];  Service: Orthopedics;  Laterality: Left;  ° INCISIONAL HERNIA REPAIR  08/10/2020  ° Procedure: LAPAROSCOPIC PRIMARY REPAIR OF INCISIONAL HERNIA;  Surgeon: Wilson, Eric, MD;  Location: [facility];  Service: General;;  ° MASS EXCISION Left 04/11/2018  ° Procedure: EXCISION MASS;  Surgeon: Kuzma, Gary, MD;  Location: [facility];  Service: Orthopedics;  Laterality: Left;  ° OOPHORECTOMY    ° POLYPECTOMY    ° ROTATOR CUFF REPAIR    ° UPPER GI ENDOSCOPY N/A 08/10/2020  ° Procedure: UPPER GI ENDOSCOPY;  Surgeon: Wilson, Eric, MD;  Location: [facility];  Service: General;  Laterality: N/A;  ° ° °Family History  °Problem Relation Age of Onset  ° Alcohol abuse Father   ° Cancer  Father   °     renal and colon  ° Hyperlipidemia Father   ° Hypertension Father   ° Stroke Father   ° Heart disease Father   ° Cancer Paternal Grandfather   °     colon  ° Diabetes Other   ° High blood pressure Mother   ° High Cholesterol Mother   ° Kidney disease Mother   ° Depression Mother   ° Anxiety disorder Mother   ° Obesity Mother   ° Breast cancer Other 45  ° Diabetes Sister   ° Diabetes Brother   ° ° °Social History  ° °Socioeconomic History  ° Marital status: Significant Other  °  Spouse name: Not on file  ° Number of children: 0  ° Years of education: Not on file  ° Highest education level: Not on file  °Occupational History  ° 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  °Tobacco Use  ° Smoking status: Former  °  Packs/day: 0.75  °  Years: 42.00  °  Pack years: 31.50  °  Types: Cigarettes  °  Quit date: 04/24/2005  °  Years since quitting: 16.1  ° Smokeless tobacco: Never  ° Tobacco comments:  °  smoked since age 16  °Vaping Use  ° Vaping Use: Never used  °Substance and Sexual Activity  ° Alcohol use: No  ° Drug use: Never  ° Sexual activity: Yes  °  Partners: Male  °Other Topics Concern  ° Not on file  °Social History Narrative  ° Endo-- Dr Kumar  ° ENT--Dr shoemaker  ° GI--Dr Buccini  ° Pulm--Dr Clance  ° Rheum--Dr Truslow  °   °   ° °Social Determinants of Health  ° °Financial Resource Strain: Not on file  °Food Insecurity: Not on file  °Transportation Needs: Not on file  °Physical Activity: Not on file  °Stress: Not on file  °Social Connections: Not on file  °Intimate Partner Violence: Not on file  ° ° °Outpatient Medications Prior to Visit  °Medication Sig Dispense Refill  ° busPIRone (BUSPAR) 7.5 MG tablet Take 1 tablet (7.5 mg total) by mouth 3 (three) times daily. 90 tablet 2  ° Calcium Carbonate 500 MG CHEW Chew 500 mg by mouth daily at 12 noon.    ° diphenhydrAMINE (BENADRYL) 25 MG tablet Take 25 mg by mouth every 6 (six) hours as needed for allergies or itching.    ° Evolocumab (REPATHA SURECLICK) 140  MG/ML SOAJ Inject 140 mg into the skin every 14 (fourteen) days. 6 mL 0  ° furosemide (LASIX) 20 MG tablet Take 1 tablet (20 mg total) by mouth daily as needed. 90 tablet 1  ° gabapentin (NEURONTIN) 100 MG capsule Take 1-3 capsules (100-300 mg   total) by mouth 3 (three) times daily as needed. (Patient taking differently: Take 300 mg by mouth at bedtime.) 90 capsule 3  ° ipratropium (ATROVENT) 0.06 % nasal spray Place 2 sprays into both nostrils 3 (three) times daily as needed for rhinitis 15 mL 12  ° ipratropium (ATROVENT) 0.06 % nasal spray Place 2 sprays into the nose daily. 15 mL 1  ° loratadine (CLARITIN) 10 MG tablet Take 20 mg by mouth daily.    ° lubiprostone (AMITIZA) 24 MCG capsule Take 1 capsule (24 mcg total) by mouth 2 (two) times daily with food and water 60 capsule 0  ° metoprolol tartrate (LOPRESSOR) 25 MG tablet Take 0.5 tablets (12.5 mg total) by mouth 2 (two) times daily. 30 tablet 0  ° Milnacipran (SAVELLA) 50 MG TABS tablet Take 1 tablet (50 mg total) by mouth 2 (two) times daily. 60 tablet 0  ° modafinil (PROVIGIL) 200 MG tablet Take 2 tablets (400 mg total) by mouth daily. 60 tablet 5  ° Multiple Vitamins-Minerals (MULTIVITAMIN WITH MINERALS) tablet Take 1 tablet by mouth daily.    ° NIFEdipine (PROCARDIA-XL/NIFEDICAL-XL) 30 MG 24 hr tablet Take 1 tablet (30 mg total) by mouth daily. 30 tablet 3  ° pantoprazole (PROTONIX) 40 MG tablet Take 1 tablet (40 mg total) by mouth daily. 90 tablet 0  ° spironolactone (ALDACTONE) 50 MG tablet Take 1 tablet (50 mg total) by mouth 3 (three) times daily. 270 tablet 3  ° tadalafil (CIALIS) 5 MG tablet Take 1 tablet (5 mg total) by mouth daily. 30 tablet 2  ° thyroid (NP THYROID) 90 MG tablet NP Thyroid 90 mg tablet °  90 mg every day by oral route.    ° Vitamin D, Ergocalciferol, (DRISDOL) 1.25 MG (50000 UNIT) CAPS capsule Take 1 capsule (50,000 Units total) by mouth every 7 (seven) days. 12 capsule 1  ° albuterol (VENTOLIN HFA) 108 (90 Base) MCG/ACT inhaler  INHALE 2 PUFFS INTO THE LUNGS EVERY 4 (FOUR) HOURS AS NEEDED FOR WHEEZING OR SHORTNESS OF BREATH. 18 g 5  ° doxycycline (VIBRA-TABS) 100 MG tablet Take 1 tablet (100 mg total) by mouth 2 (two) times daily. 14 tablet 0  ° thyroid (ARMOUR) 90 MG tablet Take 1 tablet (90 mg total) by mouth daily. 90 tablet 4  ° °Facility-Administered Medications Prior to Visit  °Medication Dose Route Frequency Provider Last Rate Last Admin  ° NIFEdipine (PROCARDIA-XL/NIFEDICAL-XL) 24 hr tablet 30 mg  30 mg Oral Daily Robins, Joshua E, MD      ° ° °Allergies  °Allergen Reactions  ° Crestor [Rosuvastatin] Other (See Comments)  °  Myalgias and rising CPK  ° Losartan Cough  ° Wellbutrin [Bupropion] Other (See Comments)  °  Dizziness and mental status changes  ° Atorvastatin   °  myalgias  ° Simvastatin   °  NDC Code:16714068101 °NDC Code:16714068101 °NDC Code:[id number]  ° Amoxicillin Rash  ° Aspirin Nausea Only and Other (See Comments)  °  GI upset °REACTION: Upset stomach  ° Codeine Rash  ° Erythromycin Rash and Other (See Comments)  ° Penicillins Rash  °  Reaction: 15 years  ° ° °Review of Systems °All review of systems negative except what is listed in the HPI ° °   °Objective:  °  °Physical Exam °Vitals reviewed.  °Constitutional:   °   Appearance: Normal appearance.  °Cardiovascular:  °   Rate and Rhythm: Normal rate and regular rhythm.  °   Pulses: Normal pulses.  °Pulmonary:  °     Effort: Pulmonary effort is normal.     Breath sounds: Normal breath sounds.  Abdominal:     General: Abdomen is flat. Bowel sounds are normal. There is no distension.     Palpations: Abdomen is soft. There is no mass.     Tenderness: There is no abdominal tenderness. There is no right CVA tenderness, left CVA tenderness, guarding or rebound.  Musculoskeletal:        General: No tenderness. Normal range of motion.  Skin:    General: Skin is warm and dry.  Neurological:     General: No focal deficit present.     Mental Status: She is alert and  oriented to person, place, and time. Mental status is at baseline.  Psychiatric:        Mood and Affect: Mood normal.        Behavior: Behavior normal.        Thought Content: Thought content normal.        Judgment: Judgment normal.    BP 123/87    Pulse (!) 110    Ht 5' 5" (1.651 m)    Wt 181 lb 3.2 oz (82.2 kg)    BMI 30.15 kg/m  Wt Readings from Last 3 Encounters:  06/24/21 181 lb 3.2 oz (82.2 kg)  05/09/21 180 lb 9.6 oz (81.9 kg)  03/30/21 185 lb (83.9 kg)    Health Maintenance Due  Topic Date Due   FOOT EXAM  08/19/2016   Zoster Vaccines- Shingrix (2 of 2) 03/07/2018   COVID-19 Vaccine (5 - Booster) 07/21/2019   OPHTHALMOLOGY EXAM  03/05/2021    There are no preventive care reminders to display for this patient.   Lab Results  Component Value Date   TSH 0.32 (L) 05/09/2021   Lab Results  Component Value Date   WBC 3.2 (L) 05/09/2021   HGB 13.4 05/09/2021   HCT 41.2 05/09/2021   MCV 91.7 05/09/2021   PLT 350.0 05/09/2021   Lab Results  Component Value Date   NA 142 05/09/2021   K 4.4 05/09/2021   CO2 28 05/09/2021   GLUCOSE 72 05/09/2021   BUN 12 05/09/2021   CREATININE 0.73 05/09/2021   BILITOT 0.3 05/09/2021   ALKPHOS 98 05/09/2021   AST 38 (H) 05/09/2021   ALT 55 (H) 05/09/2021   PROT 6.6 05/09/2021   ALBUMIN 4.1 05/09/2021   CALCIUM 9.6 05/09/2021   ANIONGAP 7 08/16/2020   EGFR 58 (L) 06/22/2020   GFR 89.37 05/09/2021   Lab Results  Component Value Date   CHOL 102 05/09/2021   Lab Results  Component Value Date   HDL 58.80 05/09/2021   Lab Results  Component Value Date   LDLCALC 30 05/09/2021   Lab Results  Component Value Date   TRIG 69.0 05/09/2021   Lab Results  Component Value Date   CHOLHDL 2 05/09/2021   Lab Results  Component Value Date   HGBA1C 5.7 05/09/2021       Assessment & Plan:   1. Hematuria, unspecified type UA: turbid, small bilirubin, large blood, + protein, - leuks/nits. Sending for microscopic and  culture. No abnormal findings on physical exam.   Stay well hydrated with water.  Sending off urine and blood to the lab to further investigate.  If you develop any severe symptoms, unusual fatigue, pain, fevers, more blood in urine, etc. Go to the ED.   - POCT Urinalysis Dipstick (Automated) - Urine Culture - Urine Microscopic Only - CBC with Differential/Platelet -  Comprehensive metabolic panel °- CK (Creatine Kinase) ° °Follow-up pending results or as needed. Patient aware of signs/symptoms requiring further/urgent evaluation.  ° ° B , NP ° °

## 2021-06-24 NOTE — Patient Instructions (Signed)
Stay well hydrated with water.  ?Sending off urine and blood to the lab to further investigate.  ?If you develop any severe symptoms, unusual fatigue, pain, fevers, more blood in urine, etc. Go to the ED.  ?

## 2021-06-26 LAB — URINE CULTURE
MICRO NUMBER:: 13085203
SPECIMEN QUALITY:: ADEQUATE

## 2021-06-26 NOTE — Progress Notes (Signed)
Office Visit Note  Patient: [name]             Date of Birth: 1960/08/05           MRN: [id number]             PCP: Mosie Lukes, MD Referring: Mosie Lukes, MD Visit Date: 06/27/2021   Subjective:  Follow-up (Doing good)   History of Present Illness: [name] is a 61 y.o. female here for follow up for raynaud's symptoms on nifedipine 30 mg daily and starting tadalafil 5 mg daily after last visit. She has not seen a significant improvement in symptoms since starting the medication. She also does not report any side effects. She notices some increased joint pain and cysts overlying her finger joints on both hands. She reports intermittent episodes of gross blood in urine. Workup for this without clear cause established so far. She had recent labs checked showing mild CK elevation. She had CK elevation previously including in 2016 rheum evaluation with normal inflammatory markers and negative ANA.  Previous HPI 03/30/21 [name] is a 61 y.o. female here for follow up with raynaud's symptoms and abnormal labs initial visit demonstrating increased serum IgM of 433 ALT of 43 and ESR 38. We also recommended switching back to a single calcium channel blocker due to peripheral edema increased on combination of amlodipine and nifedipine.  Now she is just taking the Procardia 30 mg daily and had resolution of the lower extremity edema.  Hand discoloration pain and tightness continues intermittently as before.   Previous HPI 03/16/21 [name] is a 61 y.o. female here for evaluation of raynaud's symptoms and abnormal labs. She has had cold hands and feet longstanding but the development of discoloration in her fingers is newer in particular since her bariatric surgery earlier this year. She experiences triphasic color changes with cold exposure most often although sometimes without clear provocation. She denies any digital pitting, peeling, or ulceration change. She  takes precautions for cold wearing gloves and wearing clothes sleeping at night to She was previously on amlodipine 5 mg PO daily as well as metoprolol for hypertension. This was recommended switching to nifedipine 30 mg PO daily after vascular surgery evaluation with findings consistent for raynaud's. She restarted the amlodipine again in combination with this since yesterday and reports noticing ankle swelling this morning. She takes modafinil 400 mg about 4 days per week for help with work concentration unchanged for about 10 years.    Labs reviewed 02/2021 ANCA ATYP P-ANCA 1:160    Review of Systems  Constitutional:  Positive for fatigue.  HENT:  Negative for mouth dryness.   Eyes:  Positive for dryness.  Respiratory:  Negative for shortness of breath.   Cardiovascular:  Negative for swelling in legs/feet.  Gastrointestinal:  Positive for constipation.  Endocrine: Positive for cold intolerance.  Genitourinary:  Negative for difficulty urinating.  Musculoskeletal:  Positive for joint pain, gait problem, joint pain, morning stiffness and muscle tenderness.  Skin:  Negative for rash.  Allergic/Immunologic: Negative for susceptible to infections.  Neurological:  Negative for numbness.  Hematological:  Positive for bruising/bleeding tendency.  Psychiatric/Behavioral:  Negative for sleep disturbance.    PMFS History:  Patient Active Problem List   Diagnosis Date Noted   Vitamin B12 deficiency 05/09/2021   Diverticula of intestine 03/16/2021   Sciatica 03/16/2021   Abnormal ANCA test 03/16/2021   High risk medication use 03/16/2021   Familial hypercholesterolemia  12/31/2020   Essential hypertension 12/31/2020   Fibromyalgia 12/20/2020   Raynaud's phenomenon 12/07/2020   Gastric bypass status for obesity 08/11/2020   Chronic idiopathic constipation 07/15/2020   Severe chronic obstructive pulmonary disease ([place]) 05/26/2020   Mild CAD 05/13/2020   Anal pain 04/12/2020   Dysphonia  04/12/2020   Epigastric pain 04/12/2020   Family history of malignant neoplasm of gastrointestinal tract 04/12/2020   Left lower quadrant pain 04/12/2020   Thoracic and lumbosacral neuritis 04/12/2020   Vocal fold nodules    Thyroid disease    Hypertension    GERD (gastroesophageal reflux disease)    Fibroid, uterine    Depressive disorder    Chronic rhinosinusitis    Anxiety    Hemorrhoids 02/22/2020   Statin intolerance 12/28/2019   Elevated CK 11/12/2019   Lymphadenopathy 11/12/2019   Neck pain 11/04/2019   Cervical radiculopathy 05/19/2019   Numbness and tingling 04/17/2019   Tachycardia 53/20/2334   Umbilical hernia 35/68/6168   Sun-damaged skin 01/19/2019   Preventative health care 01/19/2019   Right arm pain 01/16/2019   Carpal tunnel syndrome of right wrist 09/04/2018   Entrapment of right ulnar nerve 09/04/2018   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/26/2018   Bilateral hand pain 01/30/2018   Mucoid cyst, joint 01/30/2018   Nodule of finger of both hands 01/10/2018   Weakness 07/03/2017   Osteoarthritis of spine with radiculopathy, cervical region 07/24/2016   Dysuria 05/25/2016   Arthritis 05/25/2016   Obesity (BMI 30.0-34.9) 02/18/2016   Left shoulder pain 06/27/2015   Pain in joint, shoulder region 06/27/2015   Myalgia 03/27/2015   Muscle cramp 05/22/2014   Mass of soft tissue of neck 02/23/2014   AP (abdominal pain) 02/15/2014   Raynaud disease 06/16/2013   Hoarseness of voice 05/11/2013   Backache 03/03/2013   Low back pain 03/03/2013   Hypokalemia 02/05/2013   Edema 01/06/2013   Diabetes mellitus type 2 in obese ([place]) 01/06/2013   Chronic pain syndrome 01/06/2013   Cough 01/06/2013   Diabetes ([place]) 01/06/2013   Perimenopause 10/05/2012   Glaucoma    SOB (shortness of breath) 09/10/2011   Slow transit constipation 02/13/2011   Vitamin D deficiency 02/13/2011   IBS (irritable bowel syndrome) 02/13/2011   RHINITIS, CHRONIC 01/14/2010    Atypical chest pain 06/16/2008   THYROMEGALY 11/11/2007   Allergic rhinitis 11/11/2007   ECZEMA, HANDS 07/22/2007   Hypothyroidism 06/06/2007   Hyperlipemia, mixed 06/06/2007   ADJ DISORDER WITH MIXED ANXIETY & DEPRESSED MOOD 06/06/2007   OSA (obstructive sleep apnea) 06/06/2007   COLONIC POLYPS, HX OF 06/06/2007    Past Medical History:  Diagnosis Date   Acute bronchitis 05/25/2016   Anxiety    Arthritis    Chronic pain syndrome 01/06/2013   Chronic rhinosinusitis    Colon polyps    Cough 01/06/2013   Depression    Diabetes ([place]) 01/06/2013   Diabetes mellitus type 2 in obese ([place]) 01/06/2013   Sees Dr Calvert Cantor for eye exam Does not see podiatry, foot exam today unremarkable except for thick cracking skin on heals  pt denies    Dyspnea    with exertion    Dysuria 05/25/2016   Edema 01/06/2013   Epistaxis 05/25/2016   Fibroid, uterine    Fibromyalgia    GERD (gastroesophageal reflux disease)    Glaucoma 12-13   Hoarseness    Hoarseness of voice 05/11/2013   Hyperglycemia 04/13/2013   Hyperlipemia, mixed 06/06/2007   Qualifier: Diagnosis of  By: Wynona Luna She feels Lipitor caused increased low back pain and weakness     Hyperlipidemia    Hypertension    Hypokalemia 02/05/2013   IBS (irritable bowel syndrome) 02/13/2011   Low back pain 03/03/2013   Muscle cramp 05/22/2014   Obesity    Obesity, unspecified 05/11/2013   OSA (obstructive sleep apnea)    cpap    Pain in joint, shoulder region 06/27/2015   Perimenopause 10/05/2012   Pre-diabetes    Raynaud disease 06/16/2013   Thyroid disease    hypothyroidism   Vocal fold nodules     Family History  Problem Relation Age of Onset   Alcohol abuse Father    Cancer Father        renal and colon   Hyperlipidemia Father    Hypertension Father    Stroke Father    Heart disease Father    Cancer Paternal Grandfather        colon   Diabetes Other    High blood pressure Mother    High Cholesterol Mother    Kidney disease  Mother    Depression Mother    Anxiety disorder Mother    Obesity Mother    Breast cancer Other 52   Diabetes Sister    Diabetes Brother    Past Surgical History:  Procedure Laterality Date   ABDOMINAL HYSTERECTOMY     BREAST EXCISIONAL BIOPSY Right    at age 53   CHOLECYSTECTOMY     colonoscopoy      [place]     x2   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   Procedure: LAPAROSCOPIC ROUX-EN-Y GASTRIC BYPASS WITH UPPER ENDOSCOPY;  Surgeon: [name], MD;  Location: [facility];  Service: General;  Laterality: N/A;   I & D EXTREMITY Left 04/11/2018   Procedure: DEBIDMENT DISTAL INTERPHALANGEAL LEFT MIDDLE;  Surgeon: Daryll Brod, MD;  Location: [place];  Service: Orthopedics;  Laterality: Left;   INCISIONAL HERNIA REPAIR  08/10/2020   Procedure: LAPAROSCOPIC PRIMARY REPAIR OF INCISIONAL HERNIA;  Surgeon: [name], MD;  Location: [facility];  Service: General;;   MASS EXCISION Left 04/11/2018   Procedure: EXCISION MASS;  Surgeon: Daryll Brod, MD;  Location: [facility];  Service: Orthopedics;  Laterality: Left;   OOPHORECTOMY     POLYPECTOMY     ROTATOR CUFF REPAIR     UPPER GI ENDOSCOPY N/A 08/10/2020   Procedure: UPPER GI ENDOSCOPY;  Surgeon: [name], MD;  Location: [facility];  Service: General;  Laterality: N/A;   Social History   Social History Narrative   Endo-- Dr Dwyane Dee   ENT--Dr shoemaker   GI--Dr Buccini   Pulm--Dr Clance   Rheum--Dr Charlestine Night         Immunization History  Administered Date(s) Administered   Influenza Split 01/10/2021   Influenza Whole 01/23/2008, 01/28/2009, 02/16/2010   Influenza, High Dose Seasonal PF 01/06/2013, 01/10/2016, 01/10/2018, 01/16/2019, 01/02/2020   Influenza,inj,Quad PF,6+ Mos 01/06/2013, 01/10/2016, 01/10/2018, 01/16/2019, 01/02/2020   Influenza-Unspecified 01/22/2014, 02/09/2015, 01/17/2017   PFIZER Comirnaty(Gray Top)Covid-19 Tri-Sucrose Vaccine 05/05/2019, 05/26/2019    PFIZER(Purple Top)SARS-COV-2 Vaccination 05/05/2019, 05/26/2019   Pneumococcal Conjugate-13 05/22/2014   Pneumococcal Polysaccharide-23 03/22/2015   Td 12/18/2000   Tdap 12/18/2000, 02/10/2014   Zoster Recombinat (Shingrix) 01/10/2018     Objective: Vital Signs: BP 137/84 (BP Location: Left Arm, Patient Position: Sitting, Cuff Size: Normal)   Pulse 91   Resp 15   Ht  5' 4.5" (1.638 m)   Wt 182 lb (82.6 kg)   BMI 30.76 kg/m    Physical Exam Cardiovascular:     Rate and Rhythm: Normal rate and regular rhythm.  Pulmonary:     Effort: Pulmonary effort is normal.     Breath sounds: Normal breath sounds.  Musculoskeletal:     Right lower leg: No edema.     Left lower leg: No edema.  Lymphadenopathy:     Cervical: No cervical adenopathy.  Skin:    General: Skin is warm and dry.     Findings: No rash.  Neurological:     Mental Status: She is alert.  Psychiatric:        Mood and Affect: Mood normal.     Musculoskeletal Exam:  Elbows full ROM no tenderness or swelling Wrists full ROM no tenderness or swelling Fingers full ROM mild DIP predominant heberdon's nodes, fluid nodules present on the dorsal side of fingers overlying joints, no tenderness or synovitis Knees full ROM no tenderness or swelling Ankles full ROM no tenderness or swelling   Investigation: No additional findings.  Imaging: No results found.  Recent Labs: Lab Results  Component Value Date   WBC 4.4 06/24/2021   HGB 13.0 06/24/2021   PLT 298.0 06/24/2021   NA 141 06/24/2021   K 4.0 06/24/2021   CL 103 06/24/2021   CO2 29 06/24/2021   GLUCOSE 85 06/24/2021   BUN 12 06/24/2021   CREATININE 0.76 06/24/2021   BILITOT 0.5 06/24/2021   ALKPHOS 85 06/24/2021   AST 52 (H) 06/24/2021   ALT 81 (H) 06/24/2021   PROT 6.5 06/24/2021   ALBUMIN 4.3 06/24/2021   CALCIUM 9.6 06/24/2021   GFRAA 79 04/27/2020    Speciality Comments: No specialty comments available.  Procedures:  No procedures  performed Allergies: Bupropion, Crestor [rosuvastatin], Losartan, Atorvastatin, Simvastatin, Amoxicillin, Aspirin, Codeine, Erythromycin, and Penicillins   Assessment / Plan:     Visit Diagnoses: Raynaud's disease without gangrene - Plan: tadalafil (CIALIS) 10 MG tablet Raynaud's phenomenon without gangrene  So far no significant benefit nor side effects with addition of the tadalafil.  Recommend increasing to 10 mg daily dose for ongoing Raynaud's symptoms.  Also anticipate symptoms likely to improve in the upcoming months due to warm weather season.  Okay to continue the nifedipine 30 mg as well.  She is on the modafinil that could be a contributor to more refractory symptoms the medication per day thumb but we will see how you do with this adjustment.  Nodule of finger of both hands  No evidence of synovitis on examination more stable degenerative changes.  Fibromyalgia  Fatigue and somewhat generalized pain symptoms not in particular exacerbation at this time.  Orders: No orders of the defined types were placed in this encounter.  Meds ordered this encounter  Medications   tadalafil (CIALIS) 10 MG tablet    Sig: Take 1 tablet (10 mg total) by mouth daily.    Dispense:  30 tablet    Refill:  2     Follow-Up Instructions: Return in about 3 months (around 09/27/2021) for Raynaud's f/u tadalafil increase f/u 66mo.   CCollier Salina MD  Note - This record has been created using [employer]  Chart creation errors have been sought, but may not always  have been located. Such creation errors do not reflect on  the standard of medical care.

## 2021-06-27 ENCOUNTER — Encounter: Payer: Self-pay | Admitting: Family Medicine

## 2021-06-27 ENCOUNTER — Ambulatory Visit (INDEPENDENT_AMBULATORY_CARE_PROVIDER_SITE_OTHER): Payer: 59 | Admitting: Internal Medicine

## 2021-06-27 ENCOUNTER — Encounter: Payer: Self-pay | Admitting: Internal Medicine

## 2021-06-27 ENCOUNTER — Other Ambulatory Visit (HOSPITAL_COMMUNITY): Payer: Self-pay

## 2021-06-27 ENCOUNTER — Other Ambulatory Visit: Payer: Self-pay

## 2021-06-27 VITALS — BP 137/84 | HR 91 | Resp 15 | Ht 64.5 in | Wt 182.0 lb

## 2021-06-27 DIAGNOSIS — I7300 Raynaud's syndrome without gangrene: Secondary | ICD-10-CM

## 2021-06-27 DIAGNOSIS — M797 Fibromyalgia: Secondary | ICD-10-CM

## 2021-06-27 DIAGNOSIS — R2233 Localized swelling, mass and lump, upper limb, bilateral: Secondary | ICD-10-CM | POA: Diagnosis not present

## 2021-06-27 MED ORDER — TADALAFIL 10 MG PO TABS
10.0000 mg | ORAL_TABLET | Freq: Every day | ORAL | 2 refills | Status: DC
  Filled 2021-06-27: qty 30, 30d supply, fill #0
  Filled 2021-08-03: qty 30, 30d supply, fill #1
  Filled 2021-08-20: qty 30, 30d supply, fill #2

## 2021-06-28 ENCOUNTER — Ambulatory Visit: Payer: 59 | Admitting: Internal Medicine

## 2021-06-29 ENCOUNTER — Encounter: Payer: Self-pay | Admitting: Family Medicine

## 2021-06-29 ENCOUNTER — Encounter: Payer: Self-pay | Admitting: Internal Medicine

## 2021-06-29 NOTE — Progress Notes (Signed)
Liver enzymes are slightly higher than they were last time - avoid alcohol, tylenol. Repeat in 2 weeks. ?CK is slightly elevated - I want you to increase your water intake and consider lowering the intensity of exercise for the next 2 weeks, then we can recheck these numbers.  ?Microscopic urine eval continues to show blood, let's recheck this in 2 weeks also since you said the appearance has improved at home.  ?*Schedule a 2 week lab appointment. I will place all of these orders and we will follow-up with results. If symptoms worsen before then, please let us know or go to the ED if severe symptoms.  ?

## 2021-06-29 NOTE — Addendum Note (Signed)
Addended by: Caleen Jobs B on: 06/29/2021 08:05 AM ? ? Modules accepted: Orders ? ?

## 2021-06-30 ENCOUNTER — Other Ambulatory Visit: Payer: Self-pay | Admitting: Vascular Surgery

## 2021-07-01 ENCOUNTER — Other Ambulatory Visit (HOSPITAL_COMMUNITY): Payer: Self-pay

## 2021-07-01 MED ORDER — NIFEDIPINE ER OSMOTIC RELEASE 30 MG PO TB24
30.0000 mg | ORAL_TABLET | Freq: Every day | ORAL | 3 refills | Status: DC
  Filled 2021-07-01: qty 90, 90d supply, fill #0
  Filled 2021-10-03: qty 30, 30d supply, fill #1

## 2021-07-04 ENCOUNTER — Encounter: Payer: Self-pay | Admitting: Internal Medicine

## 2021-07-06 ENCOUNTER — Other Ambulatory Visit (HOSPITAL_COMMUNITY): Payer: Self-pay

## 2021-07-06 DIAGNOSIS — R42 Dizziness and giddiness: Secondary | ICD-10-CM | POA: Diagnosis not present

## 2021-07-06 DIAGNOSIS — J301 Allergic rhinitis due to pollen: Secondary | ICD-10-CM | POA: Diagnosis not present

## 2021-07-06 DIAGNOSIS — R490 Dysphonia: Secondary | ICD-10-CM | POA: Diagnosis not present

## 2021-07-06 MED ORDER — AZELASTINE HCL 0.1 % NA SOLN
1.0000 | Freq: Two times a day (BID) | NASAL | 12 refills | Status: DC
  Filled 2021-07-06: qty 30, 25d supply, fill #0
  Filled 2021-10-03: qty 30, 25d supply, fill #1

## 2021-07-06 MED ORDER — FLUTICASONE PROPIONATE 50 MCG/ACT NA SUSP
1.0000 | Freq: Every day | NASAL | 12 refills | Status: DC
  Filled 2021-07-06: qty 16, 30d supply, fill #0

## 2021-07-11 ENCOUNTER — Other Ambulatory Visit: Payer: Self-pay

## 2021-07-11 ENCOUNTER — Encounter: Payer: Self-pay | Admitting: Internal Medicine

## 2021-07-11 ENCOUNTER — Ambulatory Visit: Payer: 59 | Admitting: Internal Medicine

## 2021-07-11 VITALS — BP 119/80 | HR 96 | Ht 64.05 in | Wt 182.4 lb

## 2021-07-11 DIAGNOSIS — T466X5D Adverse effect of antihyperlipidemic and antiarteriosclerotic drugs, subsequent encounter: Secondary | ICD-10-CM | POA: Diagnosis not present

## 2021-07-11 DIAGNOSIS — R748 Abnormal levels of other serum enzymes: Secondary | ICD-10-CM | POA: Diagnosis not present

## 2021-07-11 DIAGNOSIS — G720 Drug-induced myopathy: Secondary | ICD-10-CM

## 2021-07-11 DIAGNOSIS — T466X5A Adverse effect of antihyperlipidemic and antiarteriosclerotic drugs, initial encounter: Secondary | ICD-10-CM

## 2021-07-11 DIAGNOSIS — I2510 Atherosclerotic heart disease of native coronary artery without angina pectoris: Secondary | ICD-10-CM | POA: Diagnosis not present

## 2021-07-11 NOTE — Patient Instructions (Signed)
Medication Instructions:  ?Your physician recommends that you continue on your current medications as directed. Please refer to the Current Medication list given to you today. ? ?*If you need a refill on your cardiac medications before your next appointment, please call your pharmacy* ? ? ?Lab Work: ?FASTING lab work in 1 year  ?-- complete with PCP in early 2024 OR we will send a lab reminder ? ?If you have labs (blood work) drawn today and your tests are completely normal, you will receive your results only by: ?MyChart Message (if you have MyChart) OR ?A paper copy in the mail ?If you have any lab test that is abnormal or we need to change your treatment, we will call you to review the results. ? ? ?Testing/Procedures: ?NONE ? ? ?Follow-Up: ?At [facility], you and your health needs are our priority.  As part of our continuing mission to provide you with exceptional heart care, we have created designated Provider Care Teams.  These Care Teams include your primary Cardiologist (physician) and Advanced Practice Providers (APPs -  Physician Assistants and Nurse Practitioners) who all work together to provide you with the care you need, when you need it. ? ?We recommend signing up for the patient portal called "MyChart".  Sign up information is provided on this After Visit Summary.  MyChart is used to connect with patients for Virtual Visits (Telemedicine).  Patients are able to view lab/test results, encounter notes, upcoming appointments, etc.  Non-urgent messages can be sent to your provider as well.   ?To learn more about what you can do with MyChart, go to [email or web address].   ? ?Your next appointment:   ?1 year with Dr. Debara Pickett  ? ?

## 2021-07-11 NOTE — Progress Notes (Signed)
? ? ?LIPID CLINIC CONSULT NOTE ? ?Chief Complaint:  ?Follow-up dyslipidemia ? ?Primary Care Physician: ?Mosie Lukes, MD ? ?Primary Cardiologist:  ?Berniece Salines, DO ? ?HPI:  ?[name] is a 61 y.o. female who is being seen today for the evaluation of dyslipidemia at the request of Mosie Lukes, MD.  This is a pleasant 61 year old female here for evaluation management of dyslipidemia.  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  More recently she struggled with weight and is actually being evaluated by Dr. [name] for bariatric surgery.  In addition her cholesterol has been elevated, more recently total cholesterol 214, HDL 44, triglycerides 182 and LDL 138.  She denies any prior history of coronary disease although does have risk factors including hypertension, type 2 diabetes and a family history of heart disease.  She had previously tried a number of statins but is struggled with myalgias and possibly has fibromyalgia.  She had seen a rheumatologist for work-up for elevated CKs.  At one point she had a CK up in the 2400 but in general his CK tends to be around in the mid 200s without any statin therapy.  Currently she is not on any treatments. ? ?06/30/2020 ? ?[name] seen today for follow-up.  Overall her cholesterol unfortunately has gone up.  She had to stop her statin and her ezetimibe.  The statin she felt was causing worsening skeletal myopathy.  Her CKs have ranged in the 200s.  Recent recheck does show some improvement with the CK now of 186.  LDL cholesterol however has risen from 138 up to 175, with total cholesterol 253, triglycerides 148 and HDL 41.  She understands the importance of lowering her cholesterol further.  Her liver enzymes have been normal.  We discussed today about the possibility of a PCSK9 inhibitor which I think is necessary given her high cholesterol.  I wonder if she could have a familial hyperlipidemia however I suspect primarily that  this is dietary.  She is interested in Jenera testing and requested a genetic test today.  She is planning on pursuing weight loss therapy.  She does have known coronary artery disease as previously mentioned, however it was mild with 0 coronary calcium and a small amount of noncalcified plaque in the RCA.  Seen on a coronary CT in January 2022.  She was also describing recurrent chest pain symptoms.  She is on Raynaud's is seen but does not report it helped her symptoms.  She is scheduled to follow-up with Dr. Harriet Masson on March 24. ? ?10/22/2020 ? ?[name] returns today for follow-up.  Overall she continues to do well.  Her cholesterols come down significantly.  Total is now 66 with HDL 43, LDL 17 and triglycerides 80.  She is on monotherapy Repatha 140 mg every 2 weeks.  She has lost weight and had previous gastric bypass surgery.  Since May she is now down about 12 pounds.  Will need to follow this closely as her LDL may get into the single digits or perhaps a negative calculated number.  We might consider discontinuing her therapy at that point or perhaps switching her to lower dose Praluent. ? ?07/11/2021 ? ?[name] is seen today in follow-up.  Cholesterol continues to be low with total 102, triglycerides 69, HDL 58 and LDL 30 on Repatha.  She denies any chest pain or worsening shortness of breath.  She has had a recent increase in her  CK's which had near normalized.  This is not likely due to the [place] however the etiology remains unclear.  She is also had some protein developing in her urine as well as some blood noted as well.  She has an underlying history of autoimmune disease but no diagnosis of lupus, which would come to mind for this. ? ?PMHx:  ?Past Medical History:  ?Diagnosis Date  ? Acute bronchitis 05/25/2016  ? Anxiety   ? Arthritis   ? Chronic pain syndrome 01/06/2013  ? Chronic rhinosinusitis   ? Colon polyps   ? Cough 01/06/2013  ? Depression   ? Diabetes ([place]) 01/06/2013  ? Diabetes mellitus type 2 in  obese ([place]) 01/06/2013  ? Sees Dr Calvert Cantor for eye exam Does not see podiatry, foot exam today unremarkable except for thick cracking skin on heals  pt denies   ? Dyspnea   ? with exertion   ? Dysuria 05/25/2016  ? Edema 01/06/2013  ? Epistaxis 05/25/2016  ? Fibroid, uterine   ? Fibromyalgia   ? GERD (gastroesophageal reflux disease)   ? Glaucoma 12-13  ? Hoarseness   ? Hoarseness of voice 05/11/2013  ? Hyperglycemia 04/13/2013  ? Hyperlipemia, mixed 06/06/2007  ? Qualifier: Diagnosis of  By: Wynona Luna She feels Lipitor caused increased low back pain and weakness    ? Hyperlipidemia   ? Hypertension   ? Hypokalemia 02/05/2013  ? IBS (irritable bowel syndrome) 02/13/2011  ? Low back pain 03/03/2013  ? Muscle cramp 05/22/2014  ? Obesity   ? Obesity, unspecified 05/11/2013  ? OSA (obstructive sleep apnea)   ? cpap   ? Pain in joint, shoulder region 06/27/2015  ? Perimenopause 10/05/2012  ? Pre-diabetes   ? Raynaud disease 06/16/2013  ? Thyroid disease   ? hypothyroidism  ? Vocal fold nodules   ? ? ?Past Surgical History:  ?Procedure Laterality Date  ? ABDOMINAL HYSTERECTOMY    ? BREAST EXCISIONAL BIOPSY Right   ? at age 46  ? CHOLECYSTECTOMY    ? colonoscopoy     ? CYSTECTOMY    ? DILATION AND CURETTAGE OF UTERUS    ? x2  ? 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 Procedure: LAPAROSCOPIC ROUX-EN-Y GASTRIC BYPASS WITH UPPER ENDOSCOPY;  Surgeon: [name], MD;  [facility];  Service: General;  Laterality: N/A;  ? I & D EXTREMITY Left 04/11/2018  ? Procedure: DEBIDMENT DISTAL INTERPHALANGEAL LEFT MIDDLE;  Surgeon: Daryll Brod, MD;  Location: [place];  Service: Orthopedics;  Laterality: Left;  ? INCISIONAL HERNIA REPAIR  08/10/2020  ? Procedure: LAPAROSCOPIC PRIMARY REPAIR OF INCISIONAL HERNIA;  Surgeon: [name], MD;  [facility];  Service: General;;  ? MASS EXCISION Left 04/11/2018  ? Procedure: EXCISION MASS;  Surgeon: Daryll Brod, MD;  Location: [place];  Service:  Orthopedics;  Laterality: Left;  ? OOPHORECTOMY    ? POLYPECTOMY    ? ROTATOR CUFF REPAIR    ? UPPER GI ENDOSCOPY N/A 08/10/2020  ? Procedure: UPPER GI ENDOSCOPY;  Surgeon: [name], MD;  [facility];  Service: General;  Laterality: N/A;  ? ? ?FAMHx:  ?Family History  ?Problem Relation Age of Onset  ? Alcohol abuse Father   ? Cancer Father   ?     renal and colon  ? Hyperlipidemia Father   ? Hypertension Father   ? Stroke Father   ? Heart disease Father   ? Cancer Paternal Grandfather   ?  colon  ? Diabetes Other   ? High blood pressure Mother   ? High Cholesterol Mother   ? Kidney disease Mother   ? Depression Mother   ? Anxiety disorder Mother   ? Obesity Mother   ? Breast cancer Other 45  ? Diabetes Sister   ? Diabetes Brother   ? ? ?SOCHx:  ? reports that she quit smoking about 16 years ago. Her smoking use included cigarettes. She has a 31.50 pack-year smoking history. She has never used smokeless tobacco. She reports that she does not drink alcohol and does not use drugs. ? ?ALLERGIES:  ?Allergies  ?Allergen Reactions  ? Bupropion Other (See Comments)  ?  Dizziness and mental status changes ?Other reaction(s): Dizziness  ? Crestor [Rosuvastatin] Other (See Comments)  ?  Myalgias and rising CPK  ? Losartan Cough  ? Atorvastatin   ?  myalgias  ? Simvastatin   ?  [place] [id number] ?[place] [id number] ?[place] [id number] ?Other reaction(s): Myalgias (Muscle Pain)  ? Amoxicillin Rash  ? Aspirin Nausea Only and Other (See Comments)  ?  GI upset ?REACTION: Upset stomach ?Other reaction(s): Vomiting  ? Codeine Rash  ? Erythromycin Rash and Other (See Comments)  ? Penicillins Rash  ?  Reaction: 15 years  ? ? ?ROS: ?Pertinent items noted in HPI and remainder of comprehensive ROS otherwise negative. ? ?HOME MEDS: ?Current Outpatient Medications on File Prior to Visit  ?Medication Sig Dispense Refill  ? azelastine (ASTELIN) 0.1 % nasal spray Place 1-2 sprays into both nostrils 2 (two) times daily. 30 mL  12  ? busPIRone (BUSPAR) 7.5 MG tablet Take 1 tablet (7.5 mg total) by mouth 3 (three) times daily. 90 tablet 2  ? calcium carbonate (OS-CAL - DOSED IN MG OF ELEMENTAL CALCIUM) 1250 (500 Ca) MG tablet     ? Calc

## 2021-07-13 NOTE — Telephone Encounter (Signed)
Patient states that she received letter from [facility] about her CPAP which is attached to this message. I have printed the letter that is attached and also last office note from 10/18/2020 and faxed them to [phone] that is listed in the letter. Confirmation received. I am going to place this is [name]'s cabinet in B pod for now. Nothing further needed at this time.  ?

## 2021-07-17 ENCOUNTER — Encounter: Payer: Self-pay | Admitting: Family Medicine

## 2021-07-19 ENCOUNTER — Other Ambulatory Visit: Payer: Self-pay | Admitting: Family Medicine

## 2021-07-19 ENCOUNTER — Other Ambulatory Visit (INDEPENDENT_AMBULATORY_CARE_PROVIDER_SITE_OTHER): Payer: 59

## 2021-07-19 DIAGNOSIS — R319 Hematuria, unspecified: Secondary | ICD-10-CM

## 2021-07-19 LAB — CK: Total CK: 131 U/L (ref 7–177)

## 2021-07-19 LAB — COMPREHENSIVE METABOLIC PANEL
ALT: 57 U/L — ABNORMAL HIGH (ref 0–35)
AST: 38 U/L — ABNORMAL HIGH (ref 0–37)
Albumin: 4.2 g/dL (ref 3.5–5.2)
Alkaline Phosphatase: 79 U/L (ref 39–117)
BUN: 9 mg/dL (ref 6–23)
CO2: 29 mEq/L (ref 19–32)
Calcium: 9.6 mg/dL (ref 8.4–10.5)
Chloride: 105 mEq/L (ref 96–112)
Creatinine, Ser: 0.76 mg/dL (ref 0.40–1.20)
GFR: 85.04 mL/min (ref >60.00)
Glucose, Bld: 90 mg/dL (ref 70–99)
Potassium: 4.2 mEq/L (ref 3.5–5.1)
Sodium: 142 mEq/L (ref 135–145)
Total Bilirubin: 0.4 mg/dL (ref 0.2–1.2)
Total Protein: 6.4 g/dL (ref 6.0–8.3)

## 2021-07-19 LAB — URINALYSIS, ROUTINE W REFLEX MICROSCOPIC
Bilirubin Urine: NEGATIVE
Ketones, ur: NEGATIVE
Leukocytes,Ua: NEGATIVE
Nitrite: NEGATIVE
Specific Gravity, Urine: 1.025 (ref 1.000–1.030)
Total Protein, Urine: 100 — AB
Urine Glucose: NEGATIVE
Urobilinogen, UA: 0.2 (ref 0.0–1.0)
pH: 6.5 (ref 5.0–8.0)

## 2021-07-19 NOTE — Addendum Note (Signed)
Addended by: Caleen Jobs B on: 07/19/2021 05:49 PM ? ? Modules accepted: Orders ? ?

## 2021-07-20 ENCOUNTER — Other Ambulatory Visit (HOSPITAL_COMMUNITY): Payer: Self-pay

## 2021-07-20 ENCOUNTER — Other Ambulatory Visit: Payer: Self-pay | Admitting: Family Medicine

## 2021-07-20 MED ORDER — METOPROLOL TARTRATE 25 MG PO TABS
12.5000 mg | ORAL_TABLET | Freq: Two times a day (BID) | ORAL | 0 refills | Status: DC
Start: 2021-07-20 — End: 2021-08-20
  Filled 2021-07-20: qty 30, 30d supply, fill #0

## 2021-07-20 MED ORDER — SAVELLA 50 MG PO TABS
50.0000 mg | ORAL_TABLET | Freq: Two times a day (BID) | ORAL | 0 refills | Status: DC
  Filled 2021-07-20: qty 60, 30d supply, fill #0

## 2021-07-20 MED ORDER — MODAFINIL 200 MG PO TABS
400.0000 mg | ORAL_TABLET | Freq: Every day | ORAL | 5 refills | Status: DC
  Filled 2021-07-20: qty 60, 30d supply, fill #0
  Filled 2021-08-22: qty 60, 30d supply, fill #1
  Filled 2021-10-03: qty 60, 30d supply, fill #2
  Filled 2021-11-15: qty 60, 30d supply, fill #3
  Filled 2021-12-15: qty 60, 30d supply, fill #4

## 2021-07-25 ENCOUNTER — Encounter: Payer: Self-pay | Admitting: Family Medicine

## 2021-08-01 NOTE — Telephone Encounter (Signed)
Hey,  ? ?Can you help see where this referral is? ?

## 2021-08-02 ENCOUNTER — Other Ambulatory Visit (HOSPITAL_COMMUNITY): Payer: Self-pay

## 2021-08-02 MED ORDER — ACARBOSE 25 MG PO TABS
25.0000 mg | ORAL_TABLET | Freq: Three times a day (TID) | ORAL | 11 refills | Status: DC
  Filled 2021-08-02: qty 90, 30d supply, fill #0
  Filled 2021-08-28: qty 90, 30d supply, fill #1
  Filled 2021-09-27: qty 90, 30d supply, fill #2
  Filled 2021-10-28: qty 90, 30d supply, fill #3
  Filled 2021-12-01: qty 90, 30d supply, fill #4
  Filled 2022-01-05 – 2022-01-18 (×2): qty 90, 30d supply, fill #5
  Filled 2022-03-05: qty 90, 30d supply, fill #6
  Filled 2022-04-04: qty 90, 30d supply, fill #7

## 2021-08-02 NOTE — Telephone Encounter (Signed)
Lmtcb for Melissa with [employer].  ?

## 2021-08-03 ENCOUNTER — Telehealth: Payer: Self-pay | Admitting: Acute Care

## 2021-08-03 ENCOUNTER — Other Ambulatory Visit: Payer: Self-pay | Admitting: Family Medicine

## 2021-08-03 ENCOUNTER — Other Ambulatory Visit (HOSPITAL_COMMUNITY): Payer: Self-pay

## 2021-08-03 NOTE — Progress Notes (Signed)
? ? ?MyChart Video Visit ? ? ? ?Virtual Visit via Video Note  ? ?This visit type was conducted due to national recommendations for restrictions regarding the COVID-19 Pandemic (e.g. social distancing) in an effort to limit this patient's exposure and mitigate transmission in our community. This patient is at least at moderate risk for complications without adequate follow up. This format is felt to be most appropriate for this patient at this time. Physical exam was limited by quality of the video and audio technology used for the visit. Tomeki F. Sandells, CMA was able to get the patient set up on a video visit. ? ?Patient location: home Patient and provider in visit ?Provider location: Office ? ?I discussed the limitations of evaluation and management by telemedicine and the availability of in person appointments. The patient expressed understanding and agreed to proceed. ? ?Visit Date: 08/04/2021 ? ?Today's healthcare provider: Penni Homans, MD  ? ? ? ?Subjective:  ? ? Patient ID: [name], female    DOB: [birth year], 61 y.o.   MRN: [id number] ? ?Chief Complaint  ?Patient presents with  ? 3 month follow up  ? ? ?HPI ?Patient is in today for follow up on chronic medical concerns. She has an appointment next month with urology Dr Diona Fanti for her hematuria. She does continue to have pelvic pressure/discomfort and notes constipation comes and goes and contributes to discomfort. Denies CP/palp/SOB/HA/congestion/fevers. Taking meds as prescribed. No recent febrile illness or hospitalizations.  ? ?Past Medical History:  ?Diagnosis Date  ? Acute bronchitis 05/25/2016  ? Anxiety   ? Arthritis   ? Chronic pain syndrome 01/06/2013  ? Chronic rhinosinusitis   ? Colon polyps   ? Cough 01/06/2013  ? Depression   ? Diabetes ([place]) 01/06/2013  ? Diabetes mellitus type 2 in obese ([place]) 01/06/2013  ? Sees Dr Calvert Cantor for eye exam Does not see podiatry, foot exam today unremarkable except for thick cracking skin on heals  pt  denies   ? Dyspnea   ? with exertion   ? Dysuria 05/25/2016  ? Edema 01/06/2013  ? Epistaxis 05/25/2016  ? Fibroid, uterine   ? Fibromyalgia   ? GERD (gastroesophageal reflux disease)   ? Glaucoma 12-13  ? Hoarseness   ? Hoarseness of voice 05/11/2013  ? Hyperglycemia 04/13/2013  ? Hyperlipemia, mixed 06/06/2007  ? Qualifier: Diagnosis of  By: Wynona Luna She feels Lipitor caused increased low back pain and weakness    ? Hyperlipidemia   ? Hypertension   ? Hypokalemia 02/05/2013  ? IBS (irritable bowel syndrome) 02/13/2011  ? Low back pain 03/03/2013  ? Muscle cramp 05/22/2014  ? Obesity   ? Obesity, unspecified 05/11/2013  ? OSA (obstructive sleep apnea)   ? cpap   ? Pain in joint, shoulder region 06/27/2015  ? Perimenopause 10/05/2012  ? Pre-diabetes   ? Raynaud disease 06/16/2013  ? Thyroid disease   ? hypothyroidism  ? Vocal fold nodules   ? ? ?Past Surgical History:  ?Procedure Laterality Date  ? ABDOMINAL HYSTERECTOMY    ? BREAST EXCISIONAL BIOPSY Right   ? at age 66  ? CHOLECYSTECTOMY    ? colonoscopoy     ? CYSTECTOMY    ? DILATION AND CURETTAGE OF UTERUS    ? x2  ? 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 Procedure: LAPAROSCOPIC ROUX-EN-Y GASTRIC BYPASS WITH UPPER ENDOSCOPY;  Surgeon: [name], MD;  Location: [facility];  Service: General;  Laterality: N/A;  ? I & D  EXTREMITY Left 04/11/2018  ? Procedure: DEBIDMENT DISTAL INTERPHALANGEAL LEFT MIDDLE;  Surgeon: Daryll Brod, MD;  Location: [place];  Service: Orthopedics;  Laterality: Left;  ? INCISIONAL HERNIA REPAIR  08/10/2020  ? Procedure: LAPAROSCOPIC PRIMARY REPAIR OF INCISIONAL HERNIA;  Surgeon: [name], MD;  Location: [facility];  Service: General;;  ? MASS EXCISION Left 04/11/2018  ? Procedure: EXCISION MASS;  Surgeon: Daryll Brod, MD;  Location: [place];  Service: Orthopedics;  Laterality: Left;  ? OOPHORECTOMY    ? POLYPECTOMY    ? ROTATOR CUFF REPAIR    ? UPPER GI ENDOSCOPY N/A 08/10/2020  ? Procedure: UPPER GI ENDOSCOPY;   Surgeon: [name], MD;  [facility];  Service: General;  Laterality: N/A;  ? ? ?Family History  ?Problem Relation Age of Onset  ? Alcohol abuse Father   ? Cancer Father   ?     renal and colon  ? Hyperlipidemia Father   ? Hypertension Father   ? Stroke Father   ? Heart disease Father   ? Cancer Paternal Grandfather   ?     colon  ? Diabetes Other   ? High blood pressure Mother   ? High Cholesterol Mother   ? Kidney disease Mother   ? Depression Mother   ? Anxiety disorder Mother   ? Obesity Mother   ? Breast cancer Other 45  ? Diabetes Sister   ? Diabetes Brother   ? ? ?Social History  ? ?Socioeconomic History  ? Marital status: Significant Other  ?  Spouse name: Not on file  ? Number of children: 0  ? Years of education: Not on file  ? Highest education level: Not on file  ?Occupational History  ? 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 Smoking status: Former  ?  Packs/day: 0.75  ?  Years: 42.00  ?  Pack years: 31.50  ?  Types: Cigarettes  ?  Quit date: 04/24/2005  ?  Years since quitting: 16.2  ? Smokeless tobacco: Never  ? Tobacco comments:  ?  smoked since age 51  ?Vaping Use  ? Vaping Use: Never used  ?Substance and Sexual Activity  ? Alcohol use: No  ? Drug use: Never  ? Sexual activity: Yes  ?  Partners: Male  ?Other Topics Concern  ? Not on file  ?Social History Narrative  ? Endo-- Dr Dwyane Dee  ? ENT--Dr shoemaker  ? GI--Dr Buccini  ? Pulm--Dr Clance  ? Rheum--Dr Charlestine Night  ?   ?   ? ?Social Determinants of Health  ? ?Financial Resource Strain: Not on file  ?Food Insecurity: Not on file  ?Transportation Needs: Not on file  ?Physical Activity: Not on file  ?Stress: Not on file  ?Social Connections: Not on file  ?Intimate Partner Violence: Not on file  ? ? ?Outpatient Medications Prior to Visit  ?Medication Sig Dispense Refill  ? acarbose (PRECOSE) 25 MG tablet Take 1 tablet (25 mg total) by mouth 3 (three) times daily with meals. 90 tablet 11  ? azelastine (ASTELIN) 0.1 % nasal spray Place 1-2  sprays into both nostrils 2 (two) times daily. 30 mL 12  ? busPIRone (BUSPAR) 7.5 MG tablet Take 1 tablet (7.5 mg total) by mouth 3 (three) times daily. 90 tablet 2  ? calcium carbonate (OS-CAL - DOSED IN MG OF ELEMENTAL CALCIUM) 1250 (500 Ca) MG tablet     ? Calcium Carbonate 500 MG CHEW Chew 500 mg by mouth  daily at 12 noon.    ? Cholecalciferol 25 MCG (1000 UT) CHEW Chew by mouth.    ? diphenhydrAMINE (BENADRYL) 25 MG tablet Take 25 mg by mouth every 6 (six) hours as needed for allergies or itching.    ? Evolocumab (REPATHA SURECLICK) 527 MG/ML SOAJ Inject 140 mg into the skin every 14 (fourteen) days. 6 mL 0  ? fluticasone (GNP FLUTICASONE PROPIONATE) 50 MCG/ACT nasal spray Place 1-2 sprays into both nostrils daily. 16 g 12  ? furosemide (LASIX) 20 MG tablet Take 1 tablet (20 mg total) by mouth daily as needed. 90 tablet 1  ? gabapentin (NEURONTIN) 100 MG capsule Take 1-3 capsules (100-300 mg total) by mouth 3 (three) times daily as needed. (Patient taking differently: Take 300 mg by mouth at bedtime.) 90 capsule 3  ? ipratropium (ATROVENT) 0.06 % nasal spray Place 2 sprays into both nostrils 3 (three) times daily as needed for rhinitis 15 mL 12  ? loratadine (CLARITIN) 10 MG tablet Take 20 mg by mouth daily.    ? lubiprostone (AMITIZA) 24 MCG capsule Take 1 capsule (24 mcg total) by mouth 2 (two) times daily with food and water 60 capsule 0  ? metoprolol tartrate (LOPRESSOR) 25 MG tablet Take 0.5 tablets (12.5 mg total) by mouth 2 (two) times daily. 30 tablet 0  ? Milnacipran (SAVELLA) 50 MG TABS tablet Take 1 tablet (50 mg total) by mouth 2 (two) times daily. 60 tablet 0  ? modafinil (PROVIGIL) 200 MG tablet Take 2 tablets (400 mg total) by mouth daily. 60 tablet 5  ? Multiple Vitamins-Minerals (MULTIVITAMIN WITH MINERALS) tablet Take 1 tablet by mouth daily.    ? NIFEdipine (PROCARDIA-XL/NIFEDICAL-XL) 30 MG 24 hr tablet Take 1 tablet (30 mg total) by mouth daily. 30 tablet 3  ? pantoprazole (PROTONIX) 40 MG  tablet Take 1 tablet (40 mg total) by mouth daily. 90 tablet 0  ? spironolactone (ALDACTONE) 50 MG tablet Take 1 tablet (50 mg total) by mouth 3 (three) times daily. 270 tablet 3  ? tadalafil (CIALIS) 10 MG

## 2021-08-04 ENCOUNTER — Other Ambulatory Visit (HOSPITAL_COMMUNITY): Payer: Self-pay

## 2021-08-04 ENCOUNTER — Telehealth (INDEPENDENT_AMBULATORY_CARE_PROVIDER_SITE_OTHER): Payer: 59 | Admitting: Family Medicine

## 2021-08-04 ENCOUNTER — Encounter: Payer: Self-pay | Admitting: Family Medicine

## 2021-08-04 VITALS — BP 128/84 | HR 80 | Temp 98.6°F | Resp 18 | Wt 174.0 lb

## 2021-08-04 DIAGNOSIS — E039 Hypothyroidism, unspecified: Secondary | ICD-10-CM | POA: Diagnosis not present

## 2021-08-04 DIAGNOSIS — K5900 Constipation, unspecified: Secondary | ICD-10-CM

## 2021-08-04 DIAGNOSIS — I7300 Raynaud's syndrome without gangrene: Secondary | ICD-10-CM

## 2021-08-04 DIAGNOSIS — R102 Pelvic and perineal pain: Secondary | ICD-10-CM | POA: Diagnosis not present

## 2021-08-04 DIAGNOSIS — R319 Hematuria, unspecified: Secondary | ICD-10-CM

## 2021-08-04 DIAGNOSIS — E1169 Type 2 diabetes mellitus with other specified complication: Secondary | ICD-10-CM

## 2021-08-04 DIAGNOSIS — E669 Obesity, unspecified: Secondary | ICD-10-CM | POA: Diagnosis not present

## 2021-08-04 DIAGNOSIS — E538 Deficiency of other specified B group vitamins: Secondary | ICD-10-CM

## 2021-08-04 DIAGNOSIS — I10 Essential (primary) hypertension: Secondary | ICD-10-CM | POA: Diagnosis not present

## 2021-08-04 DIAGNOSIS — E559 Vitamin D deficiency, unspecified: Secondary | ICD-10-CM

## 2021-08-04 DIAGNOSIS — G4733 Obstructive sleep apnea (adult) (pediatric): Secondary | ICD-10-CM

## 2021-08-04 DIAGNOSIS — E782 Mixed hyperlipidemia: Secondary | ICD-10-CM

## 2021-08-04 MED ORDER — VITAMIN D (ERGOCALCIFEROL) 1.25 MG (50000 UNIT) PO CAPS
50000.0000 [IU] | ORAL_CAPSULE | ORAL | 1 refills | Status: DC
  Filled 2021-08-04: qty 12, 84d supply, fill #0
  Filled 2021-10-15: qty 12, 84d supply, fill #1

## 2021-08-04 NOTE — Telephone Encounter (Signed)
Called Brad from [employer] to try to get more information for the patient. And [name] Sea states that he found documentation that she had been compliant with her CPAP for three consecutive months and then on the fourth month patient asked for the machine to be picked up and the insurance deemed this as a negative and stopped paying for it.  ? ?[name] Sea states that the machine was picked up on 07/01/2021 ?Insurance stopped paying on 07/19/2021 ? ?If patient wishes to get back on CPA therapy she will have to restart the whole process. [employer] states they will try to reach out to determine why she wanted the machine picked up.  ? ?Will need to discuss this with patient at her follow up to determine if she wants to continue cpap therapy. Tried calling patient to get more information but there was no answer on patients end. Will try again to call patient regarding her cpap machine.  ? ?

## 2021-08-04 NOTE — Telephone Encounter (Signed)
Last vit d checked on 05/09/21 and was 70.29. ?

## 2021-08-05 DIAGNOSIS — R319 Hematuria, unspecified: Secondary | ICD-10-CM | POA: Insufficient documentation

## 2021-08-05 DIAGNOSIS — R102 Pelvic and perineal pain: Secondary | ICD-10-CM | POA: Insufficient documentation

## 2021-08-05 NOTE — Assessment & Plan Note (Signed)
Has been referred to urology and has appt next month ?

## 2021-08-05 NOTE — Assessment & Plan Note (Signed)
Encouraged increased hydration and fiber in diet. Daily probiotics. If bowels not moving can use MOM 2 tbls po in 4 oz of warm prune juice by mouth every 2-3 days. If no results then repeat in 4 hours with  Dulcolax suppository pr, may repeat again in 4 more hours as needed. Seek care if symptoms worsen. Consider daily Miralax and/or Dulcolax if symptoms persist.  

## 2021-08-05 NOTE — Assessment & Plan Note (Signed)
Monitor and report any concerns. no changes to meds. Encouraged heart healthy diet such as the DASH diet and exercise as tolerated.  

## 2021-08-05 NOTE — Assessment & Plan Note (Signed)
Referred to urogynecology for further evaluation ?

## 2021-08-05 NOTE — Assessment & Plan Note (Signed)
hgba1c acceptable, minimize simple carbs. Increase exercise as tolerated. Continue current meds 

## 2021-08-05 NOTE — Telephone Encounter (Signed)
Please refer to phone encounter from 4/12. ?

## 2021-08-05 NOTE — Telephone Encounter (Signed)
Called and spoke with pt about the letter she received from insurance and also stated to her the information documented from another coworker's conversation with [employer] yesterday 4/13. Pt said that she never had her machine  picked up and stated that she has had no break in therapy. Pt is still wearing her cpap machine every night and when I reviewed download in [employer], saw that she is 100% compliant.  Stated to pt that I would call main rep with [employer] to see what she says about the letter and she verbalized understanding. ? ? ?Called [employer] and spoke with Melissa about the letter pt received. Melissa said that she is not sure why pt received the letter from insurance. Stated that there was something documented on their end where her account was on hold prior due to a past balance but this may no longer be in effect at this current time due to the timeframe that this was from. ? ? ?She said that there may have possibly been an audit done on the cpap order that was last ordered due to when pt's sleep study was done. Pt's last sleep study was ordered by Dr. Gwenette Greet and was performed 08/12/2006 which showed pt had very mild sleep apnea having 5 events per hour. ? ? ?With all of this, Lenna Sciara said that she was going to have to talk with their auth department to see if they have any idea why pt received the letter that she did, especially if pt has not had any break in therapy. ? ?Routing this to Judson Roch as an FYI as in [employer] encounter, it stated that the letter was placed in [place] box. ?

## 2021-08-09 ENCOUNTER — Encounter: Payer: Self-pay | Admitting: Family Medicine

## 2021-08-20 ENCOUNTER — Other Ambulatory Visit: Payer: Self-pay | Admitting: Family Medicine

## 2021-08-20 ENCOUNTER — Other Ambulatory Visit: Payer: Self-pay | Admitting: Internal Medicine

## 2021-08-22 ENCOUNTER — Other Ambulatory Visit: Payer: Self-pay | Admitting: Family Medicine

## 2021-08-22 ENCOUNTER — Other Ambulatory Visit (HOSPITAL_COMMUNITY): Payer: Self-pay

## 2021-08-22 MED ORDER — METOPROLOL TARTRATE 25 MG PO TABS
12.5000 mg | ORAL_TABLET | Freq: Two times a day (BID) | ORAL | 0 refills | Status: DC
  Filled 2021-08-22: qty 30, 30d supply, fill #0

## 2021-08-22 MED ORDER — REPATHA SURECLICK 140 MG/ML ~~LOC~~ SOAJ
140.0000 mg | SUBCUTANEOUS | 3 refills | Status: DC
  Filled 2021-08-22: qty 6, 84d supply, fill #0
  Filled 2021-11-15: qty 6, 84d supply, fill #1
  Filled 2022-02-01: qty 6, 84d supply, fill #2
  Filled 2022-05-01: qty 6, 84d supply, fill #3

## 2021-08-22 MED ORDER — SAVELLA 50 MG PO TABS
50.0000 mg | ORAL_TABLET | Freq: Two times a day (BID) | ORAL | 5 refills | Status: DC
  Filled 2021-08-22: qty 60, 30d supply, fill #0
  Filled 2021-09-27: qty 60, 30d supply, fill #1
  Filled 2021-10-23: qty 60, 30d supply, fill #2
  Filled 2021-11-22: qty 60, 30d supply, fill #3
  Filled 2021-12-23: qty 60, 30d supply, fill #4
  Filled 2022-02-01: qty 60, 30d supply, fill #5

## 2021-08-22 MED ORDER — FUROSEMIDE 20 MG PO TABS
20.0000 mg | ORAL_TABLET | Freq: Every day | ORAL | 1 refills | Status: DC | PRN
  Filled 2021-08-22: qty 90, 90d supply, fill #0
  Filled 2022-03-10: qty 90, 90d supply, fill #1

## 2021-08-23 ENCOUNTER — Other Ambulatory Visit (HOSPITAL_COMMUNITY): Payer: Self-pay

## 2021-08-23 ENCOUNTER — Other Ambulatory Visit: Payer: Self-pay | Admitting: Family Medicine

## 2021-08-23 MED ORDER — PANTOPRAZOLE SODIUM 40 MG PO TBEC
40.0000 mg | DELAYED_RELEASE_TABLET | Freq: Every day | ORAL | 0 refills | Status: DC
  Filled 2021-08-23 – 2021-09-22 (×3): qty 90, 90d supply, fill #0

## 2021-08-24 ENCOUNTER — Other Ambulatory Visit (HOSPITAL_COMMUNITY): Payer: Self-pay

## 2021-08-24 DIAGNOSIS — E119 Type 2 diabetes mellitus without complications: Secondary | ICD-10-CM | POA: Diagnosis not present

## 2021-08-24 DIAGNOSIS — Z9884 Bariatric surgery status: Secondary | ICD-10-CM | POA: Diagnosis not present

## 2021-08-24 DIAGNOSIS — E6609 Other obesity due to excess calories: Secondary | ICD-10-CM | POA: Diagnosis not present

## 2021-08-24 DIAGNOSIS — Z8639 Personal history of other endocrine, nutritional and metabolic disease: Secondary | ICD-10-CM | POA: Diagnosis not present

## 2021-08-26 ENCOUNTER — Other Ambulatory Visit (HOSPITAL_COMMUNITY): Payer: Self-pay

## 2021-08-26 DIAGNOSIS — Z9884 Bariatric surgery status: Secondary | ICD-10-CM | POA: Diagnosis not present

## 2021-08-26 DIAGNOSIS — R310 Gross hematuria: Secondary | ICD-10-CM | POA: Diagnosis not present

## 2021-08-26 DIAGNOSIS — R350 Frequency of micturition: Secondary | ICD-10-CM | POA: Diagnosis not present

## 2021-08-26 MED ORDER — SULFAMETHOXAZOLE-TRIMETHOPRIM 800-160 MG PO TABS
1.0000 | ORAL_TABLET | Freq: Two times a day (BID) | ORAL | 0 refills | Status: DC
  Filled 2021-08-26: qty 14, 7d supply, fill #0

## 2021-08-29 ENCOUNTER — Telehealth (INDEPENDENT_AMBULATORY_CARE_PROVIDER_SITE_OTHER): Payer: 59 | Admitting: Psychiatry

## 2021-08-29 ENCOUNTER — Other Ambulatory Visit (HOSPITAL_COMMUNITY): Payer: Self-pay

## 2021-08-29 ENCOUNTER — Encounter (HOSPITAL_COMMUNITY): Payer: Self-pay | Admitting: Psychiatry

## 2021-08-29 DIAGNOSIS — M797 Fibromyalgia: Secondary | ICD-10-CM

## 2021-08-29 DIAGNOSIS — F411 Generalized anxiety disorder: Secondary | ICD-10-CM | POA: Diagnosis not present

## 2021-08-29 MED ORDER — BUSPIRONE HCL 7.5 MG PO TABS
7.5000 mg | ORAL_TABLET | Freq: Two times a day (BID) | ORAL | 1 refills | Status: DC
  Filled 2021-08-29 – 2021-10-03 (×2): qty 60, 30d supply, fill #0
  Filled 2021-11-15: qty 60, 30d supply, fill #1

## 2021-08-29 NOTE — Progress Notes (Signed)
[place] Follow up visit ? ?Patient Identification: [name] ?MRN:  [id number] ?Date of Evaluation:  08/29/2021 ?Referral Source: Primary care ?Chief Complaint:  follow up anxiety ?Visit Diagnosis:  ?  ICD-10-CM   ?1. Generalized anxiety disorder  F41.1   ?  ?2. GAD (generalized anxiety disorder)  F41.1   ?  ?3. Fibromyalgia  M79.7   ?  ?Virtual Visit via Video Note ? ?I connected with [name] on 08/29/21 at  3:00 PM EDT by a video enabled telemedicine application and verified that I am speaking with the correct person using two identifiers. ? ?Location: ?Patient: home ?Provider: home office ?  ?I discussed the limitations of evaluation and management by telemedicine and the availability of in person appointments. The patient expressed understanding and agreed to proceed. ? ? ? ?  ?I discussed the assessment and treatment plan with the patient. The patient was provided an opportunity to ask questions and all were answered. The patient agreed with the plan and demonstrated an understanding of the instructions. ?  ?The patient was advised to call back or seek an in-person evaluation if the symptoms worsen or if the condition fails to improve as anticipated. ? ?I provided 15 minutes of non-face-to-face time during this encounter. ? ? ?  ? ?History of Present Illness: Patient is a 61 years old female lives with her significant other initially referred by primary care physician for establish care for anxiety.  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  Does not have any kids ? ? ?Has been on Ativan Xanax before, Effexor. ? ?Job stress is there but she is keeping it going till retirement ?Petra Kuba to look for a hobby ?Overall meds keeping some balance and therapy ? ?Mora Appl for fibromyalgia ? ? ?She follows with Francie Massing for therapy ? ?She uses a CPAP machine at night ? ?Aggravating factors; job stress ?Modifying factors; trying to look for hobby ?Duration since adult  life ? ? ? ? ? ?Past Psychiatric History: anxiety ? ?Previous Psychotropic Medications: Yes  ?Effexor, xanax ? ?Past Medical History:  ?Past Medical History:  ?Diagnosis Date  ? Acute bronchitis 05/25/2016  ? Anxiety   ? Arthritis   ? Chronic pain syndrome 01/06/2013  ? Chronic rhinosinusitis   ? Colon polyps   ? Cough 01/06/2013  ? Depression   ? Diabetes ([place]) 01/06/2013  ? Diabetes mellitus type 2 in obese ([place]) 01/06/2013  ? Sees Dr Calvert Cantor for eye exam Does not see podiatry, foot exam today unremarkable except for thick cracking skin on heals  pt denies   ? Dyspnea   ? with exertion   ? Dysuria 05/25/2016  ? Edema 01/06/2013  ? Epistaxis 05/25/2016  ? Fibroid, uterine   ? Fibromyalgia   ? GERD (gastroesophageal reflux disease)   ? Glaucoma 12-13  ? Hoarseness   ? Hoarseness of voice 05/11/2013  ? Hyperglycemia 04/13/2013  ? Hyperlipemia, mixed 06/06/2007  ? Qualifier: Diagnosis of  By: Wynona Luna She feels Lipitor caused increased low back pain and weakness    ? Hyperlipidemia   ? Hypertension   ? Hypokalemia 02/05/2013  ? IBS (irritable bowel syndrome) 02/13/2011  ? Low back pain 03/03/2013  ? Muscle cramp 05/22/2014  ? Obesity   ? Obesity, unspecified 05/11/2013  ? OSA (obstructive sleep apnea)   ? cpap   ? Pain in joint, shoulder region 06/27/2015  ? Perimenopause 10/05/2012  ? Pre-diabetes   ?  Raynaud disease 06/16/2013  ? Thyroid disease   ? hypothyroidism  ? Vocal fold nodules   ?  ?Past Surgical History:  ?Procedure Laterality Date  ? ABDOMINAL HYSTERECTOMY    ? BREAST EXCISIONAL BIOPSY Right   ? at age 61  ? CHOLECYSTECTOMY    ? colonoscopoy     ? CYSTECTOMY    ? DILATION AND CURETTAGE OF UTERUS    ? x2  ? 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 Procedure: LAPAROSCOPIC ROUX-EN-Y GASTRIC BYPASS WITH UPPER ENDOSCOPY;  Surgeon: [name], MD;  [facility];  Service: General;  Laterality: N/A;  ? I & D EXTREMITY Left 04/11/2018  ? Procedure: DEBIDMENT DISTAL INTERPHALANGEAL LEFT MIDDLE;  Surgeon: Daryll Brod,  MD;  Location: [place];  Service: Orthopedics;  Laterality: Left;  ? INCISIONAL HERNIA REPAIR  08/10/2020  ? Procedure: LAPAROSCOPIC PRIMARY REPAIR OF INCISIONAL HERNIA;  Surgeon: [name], MD;  [facility];  Service: General;;  ? MASS EXCISION Left 04/11/2018  ? Procedure: EXCISION MASS;  Surgeon: Daryll Brod, MD;  Location: [place];  Service: Orthopedics;  Laterality: Left;  ? OOPHORECTOMY    ? POLYPECTOMY    ? ROTATOR CUFF REPAIR    ? UPPER GI ENDOSCOPY N/A 08/10/2020  ? Procedure: UPPER GI ENDOSCOPY;  Surgeon: [name], MD;  [facility];  Service: General;  Laterality: N/A;  ? ? ?Family Psychiatric History: dad : alcohol use ? ?Family History:  ?Family History  ?Problem Relation Age of Onset  ? Alcohol abuse Father   ? Cancer Father   ?     renal and colon  ? Hyperlipidemia Father   ? Hypertension Father   ? Stroke Father   ? Heart disease Father   ? Cancer Paternal Grandfather   ?     colon  ? Diabetes Other   ? High blood pressure Mother   ? High Cholesterol Mother   ? Kidney disease Mother   ? Depression Mother   ? Anxiety disorder Mother   ? Obesity Mother   ? Breast cancer Other 45  ? Diabetes Sister   ? Diabetes Brother   ? ? ?Social History:   ?Social History  ? ?Socioeconomic History  ? Marital status: Significant Other  ?  Spouse name: Not on file  ? Number of children: 0  ? Years of education: Not on file  ? Highest education level: Not on file  ?Occupational History  ? 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 Smoking status: Former  ?  Packs/day: 0.75  ?  Years: 42.00  ?  Pack years: 31.50  ?  Types: Cigarettes  ?  Quit date: 04/24/2005  ?  Years since quitting: 16.3  ? Smokeless tobacco: Never  ? Tobacco comments:  ?  smoked since age 22  ?Vaping Use  ? Vaping Use: Never used  ?Substance and Sexual Activity  ? Alcohol use: No  ? Drug use: Never  ? Sexual activity: Yes  ?  Partners: Male  ?Other Topics Concern  ? Not on file  ?Social  History Narrative  ? Endo-- Dr Dwyane Dee  ? ENT--Dr shoemaker  ? GI--Dr Buccini  ? Pulm--Dr Clance  ? Rheum--Dr Charlestine Night  ?   ?   ? ?Social Determinants of Health  ? ?Financial Resource Strain: Not on file  ?Food Insecurity: Not on file  ?Transportation Needs: Not on file  ?Physical Activity: Not on file  ?Stress: Not on file  ?  Social Connections: Not on file  ? ? ? ? ?Allergies:   ?Allergies  ?Allergen Reactions  ? Bupropion Other (See Comments)  ?  Dizziness and mental status changes ?Other reaction(s): Dizziness  ? Crestor [Rosuvastatin] Other (See Comments)  ?  Myalgias and rising CPK  ? Losartan Cough  ? Atorvastatin   ?  myalgias  ? Simvastatin   ?  [place] JJKK:[id number] ?[place] IRCV:[id number] ?[place] CHEN:[id number] ?Other reaction(s): Myalgias (Muscle Pain)  ? Amoxicillin Rash  ? Aspirin Nausea Only and Other (See Comments)  ?  GI upset ?REACTION: Upset stomach ?Other reaction(s): Vomiting  ? Codeine Rash  ? Erythromycin Rash and Other (See Comments)  ? Penicillins Rash  ?  Reaction: 15 years  ? ? ?Metabolic Disorder Labs: ?Lab Results  ?Component Value Date  ? HGBA1C 5.7 05/09/2021  ? MPG 122.63 08/03/2020  ? MPG 137 02/13/2020  ? ?No results found for: PROLACTIN ?Lab Results  ?Component Value Date  ? CHOL 102 05/09/2021  ? TRIG 69.0 05/09/2021  ? HDL 58.80 05/09/2021  ? CHOLHDL 2 05/09/2021  ? VLDL 13.8 05/09/2021  ? [place] 30 05/09/2021  ? [place] 25 01/10/2021  ? ?Lab Results  ?Component Value Date  ? TSH 0.32 (L) 05/09/2021  ? ? ?Therapeutic Level Labs: ?No results found for: LITHIUM ?No results found for: CBMZ ?No results found for: VALPROATE ? ?Current Medications: ?Current Outpatient Medications  ?Medication Sig Dispense Refill  ? acarbose (PRECOSE) 25 MG tablet Take 1 tablet (25 mg total) by mouth 3 (three) times daily with meals. 90 tablet 11  ? azelastine (ASTELIN) 0.1 % nasal spray Place 1-2 sprays into both nostrils 2 (two) times daily. 30 mL 12  ? busPIRone (BUSPAR) 7.5 MG tablet Take 1 tablet (7.5 mg  total) by mouth 2 (two) times daily. 60 tablet 1  ? calcium carbonate (OS-CAL - DOSED IN MG OF ELEMENTAL CALCIUM) 1250 (500 Ca) MG tablet     ? Calcium Carbonate 500 MG CHEW Chew 500 mg by mouth daily at 12 noon.

## 2021-08-31 DIAGNOSIS — R319 Hematuria, unspecified: Secondary | ICD-10-CM | POA: Diagnosis not present

## 2021-08-31 DIAGNOSIS — R310 Gross hematuria: Secondary | ICD-10-CM | POA: Diagnosis not present

## 2021-08-31 DIAGNOSIS — N1330 Unspecified hydronephrosis: Secondary | ICD-10-CM | POA: Diagnosis not present

## 2021-09-02 ENCOUNTER — Other Ambulatory Visit (HOSPITAL_COMMUNITY): Payer: Self-pay

## 2021-09-02 MED ORDER — TAMSULOSIN HCL 0.4 MG PO CAPS
0.4000 mg | ORAL_CAPSULE | Freq: Every day | ORAL | 1 refills | Status: DC
  Filled 2021-09-02: qty 21, 21d supply, fill #0
  Filled 2021-10-17: qty 21, 21d supply, fill #1

## 2021-09-02 MED ORDER — ONDANSETRON HCL 4 MG PO TABS
4.0000 mg | ORAL_TABLET | Freq: Three times a day (TID) | ORAL | 1 refills | Status: DC | PRN
  Filled 2021-09-02: qty 6, 2d supply, fill #0

## 2021-09-02 MED ORDER — TRAMADOL HCL 50 MG PO TABS
50.0000 mg | ORAL_TABLET | Freq: Three times a day (TID) | ORAL | 0 refills | Status: DC | PRN
  Filled 2021-09-02: qty 10, 4d supply, fill #0

## 2021-09-05 ENCOUNTER — Encounter: Payer: Self-pay | Admitting: Family Medicine

## 2021-09-06 ENCOUNTER — Other Ambulatory Visit (HOSPITAL_COMMUNITY): Payer: Self-pay

## 2021-09-06 ENCOUNTER — Encounter: Payer: Self-pay | Admitting: Family Medicine

## 2021-09-06 MED ORDER — METOPROLOL TARTRATE 25 MG PO TABS
12.5000 mg | ORAL_TABLET | Freq: Two times a day (BID) | ORAL | 0 refills | Status: DC
Start: 2021-09-06 — End: 2021-11-22
  Filled 2021-09-06 – 2021-09-27 (×2): qty 60, 60d supply, fill #0

## 2021-09-08 ENCOUNTER — Other Ambulatory Visit (HOSPITAL_COMMUNITY): Payer: Self-pay

## 2021-09-09 ENCOUNTER — Encounter: Payer: Self-pay | Admitting: Internal Medicine

## 2021-09-16 ENCOUNTER — Other Ambulatory Visit (HOSPITAL_COMMUNITY): Payer: Self-pay

## 2021-09-18 ENCOUNTER — Other Ambulatory Visit: Payer: Self-pay | Admitting: Family Medicine

## 2021-09-20 ENCOUNTER — Other Ambulatory Visit (HOSPITAL_COMMUNITY): Payer: Self-pay

## 2021-09-20 DIAGNOSIS — E039 Hypothyroidism, unspecified: Secondary | ICD-10-CM | POA: Diagnosis not present

## 2021-09-20 MED ORDER — IPRATROPIUM BROMIDE 0.06 % NA SOLN
2.0000 | Freq: Three times a day (TID) | NASAL | 12 refills | Status: DC | PRN
  Filled 2021-09-20: qty 15, 25d supply, fill #0
  Filled 2021-12-01: qty 15, 14d supply, fill #0

## 2021-09-20 MED ORDER — NP THYROID 90 MG PO TABS
90.0000 mg | ORAL_TABLET | Freq: Every day | ORAL | 4 refills | Status: DC
  Filled 2021-09-20: qty 90, 90d supply, fill #0

## 2021-09-21 ENCOUNTER — Other Ambulatory Visit (HOSPITAL_COMMUNITY): Payer: Self-pay

## 2021-09-21 ENCOUNTER — Encounter: Payer: Self-pay | Admitting: Family Medicine

## 2021-09-21 DIAGNOSIS — N201 Calculus of ureter: Secondary | ICD-10-CM | POA: Diagnosis not present

## 2021-09-21 DIAGNOSIS — R350 Frequency of micturition: Secondary | ICD-10-CM | POA: Diagnosis not present

## 2021-09-21 MED ORDER — TAMSULOSIN HCL 0.4 MG PO CAPS
0.4000 mg | ORAL_CAPSULE | Freq: Every day | ORAL | 1 refills | Status: DC
  Filled 2021-09-21: qty 30, 30d supply, fill #0

## 2021-09-22 ENCOUNTER — Other Ambulatory Visit (HOSPITAL_COMMUNITY): Payer: Self-pay

## 2021-09-22 LAB — HM DIABETES EYE EXAM

## 2021-09-22 IMAGING — DX DG LUMBAR SPINE 2-3V
3 series · 3 of 3 positions shown · non-contrast
Comparison: None.

CLINICAL DATA: Low back pain

EXAM:
LUMBAR SPINE - 2-3 VIEW

[l-spine ap]
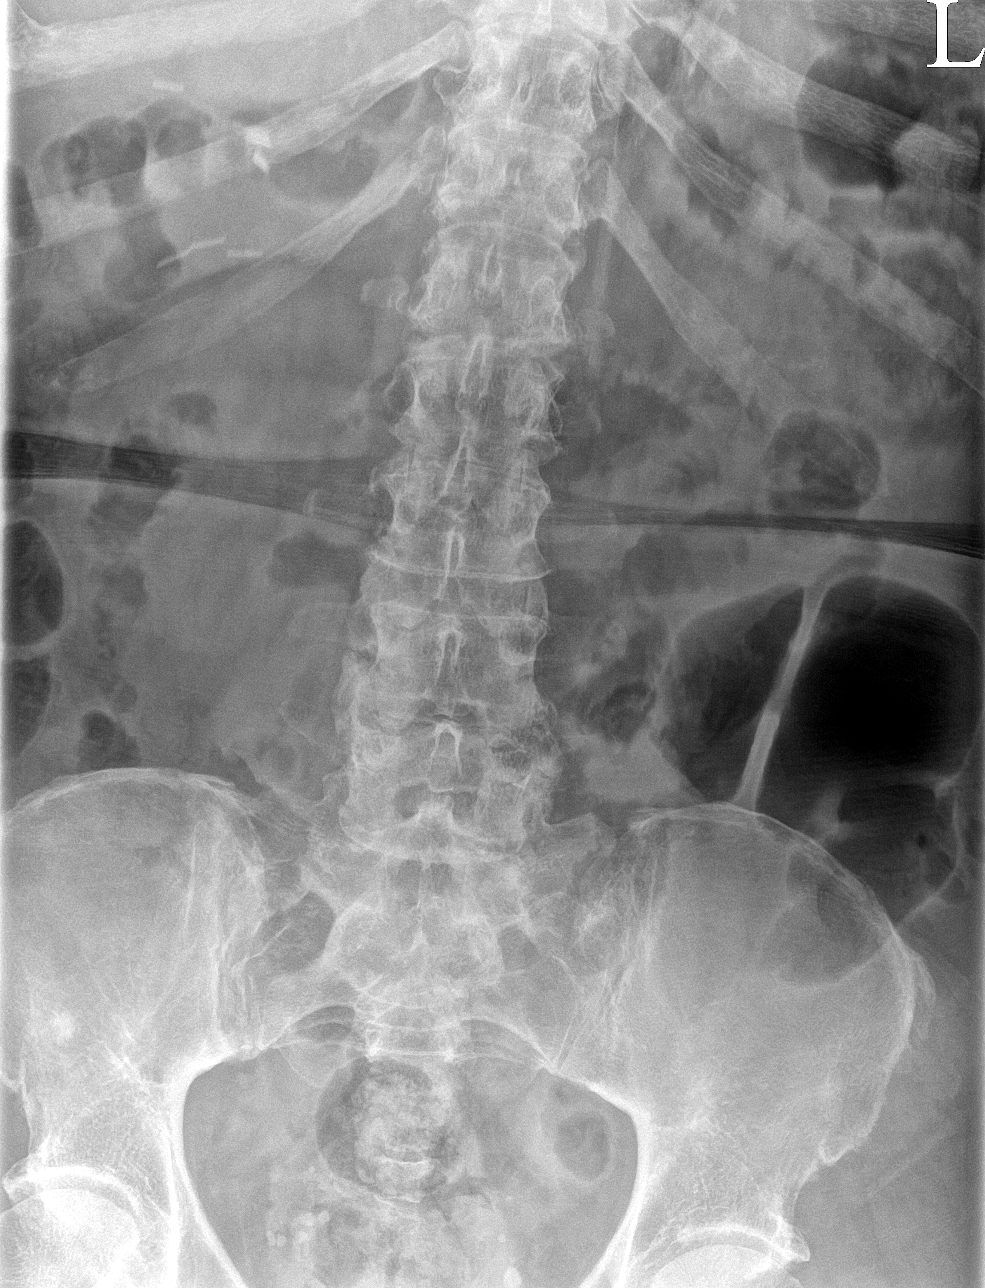

[l-spine lateral]
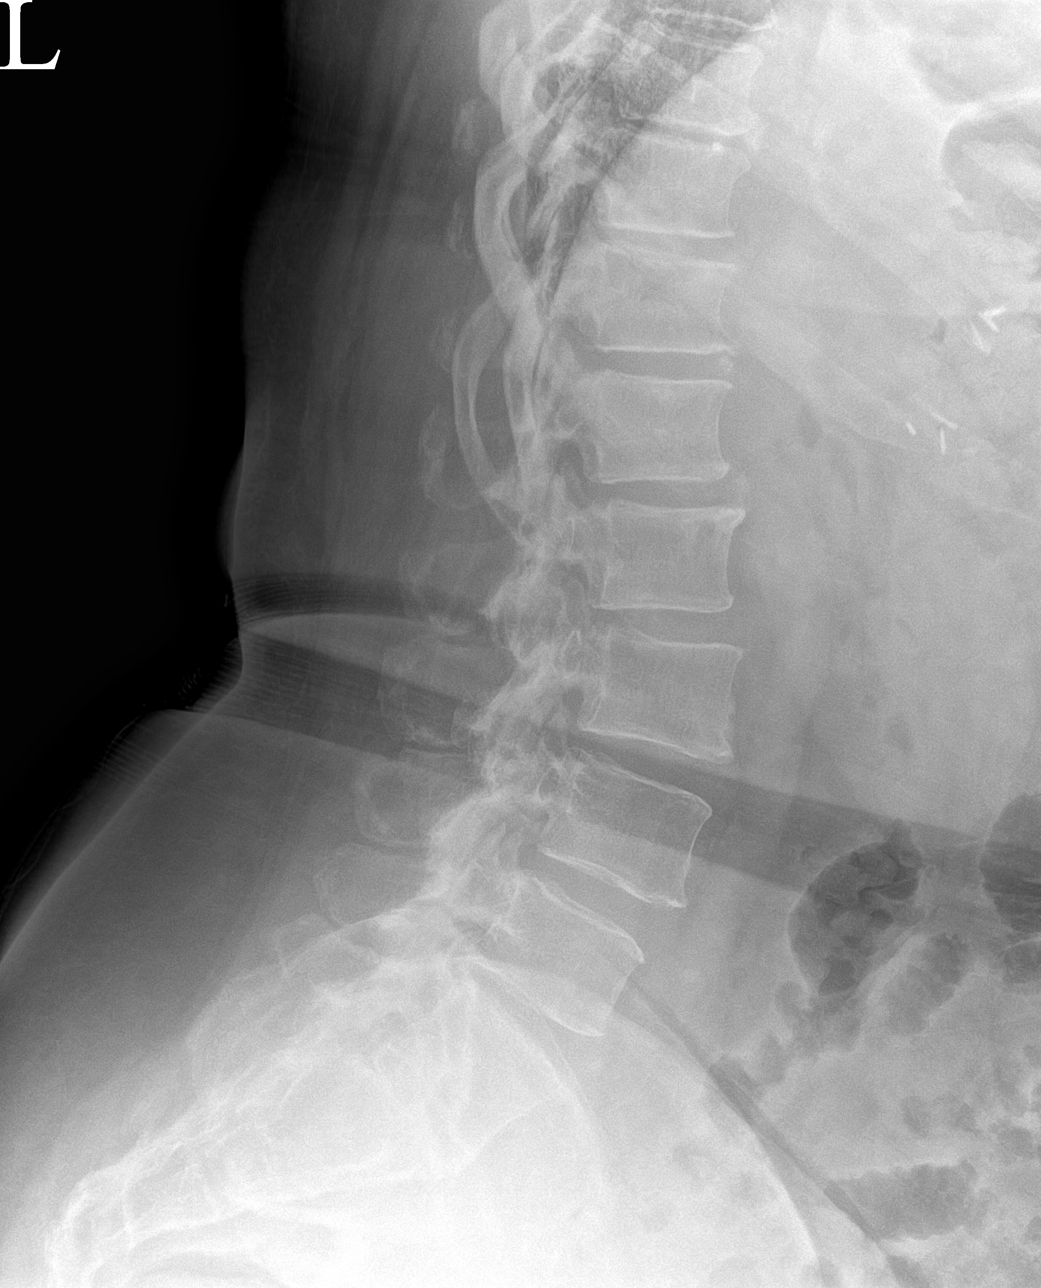

[l-spine spot]
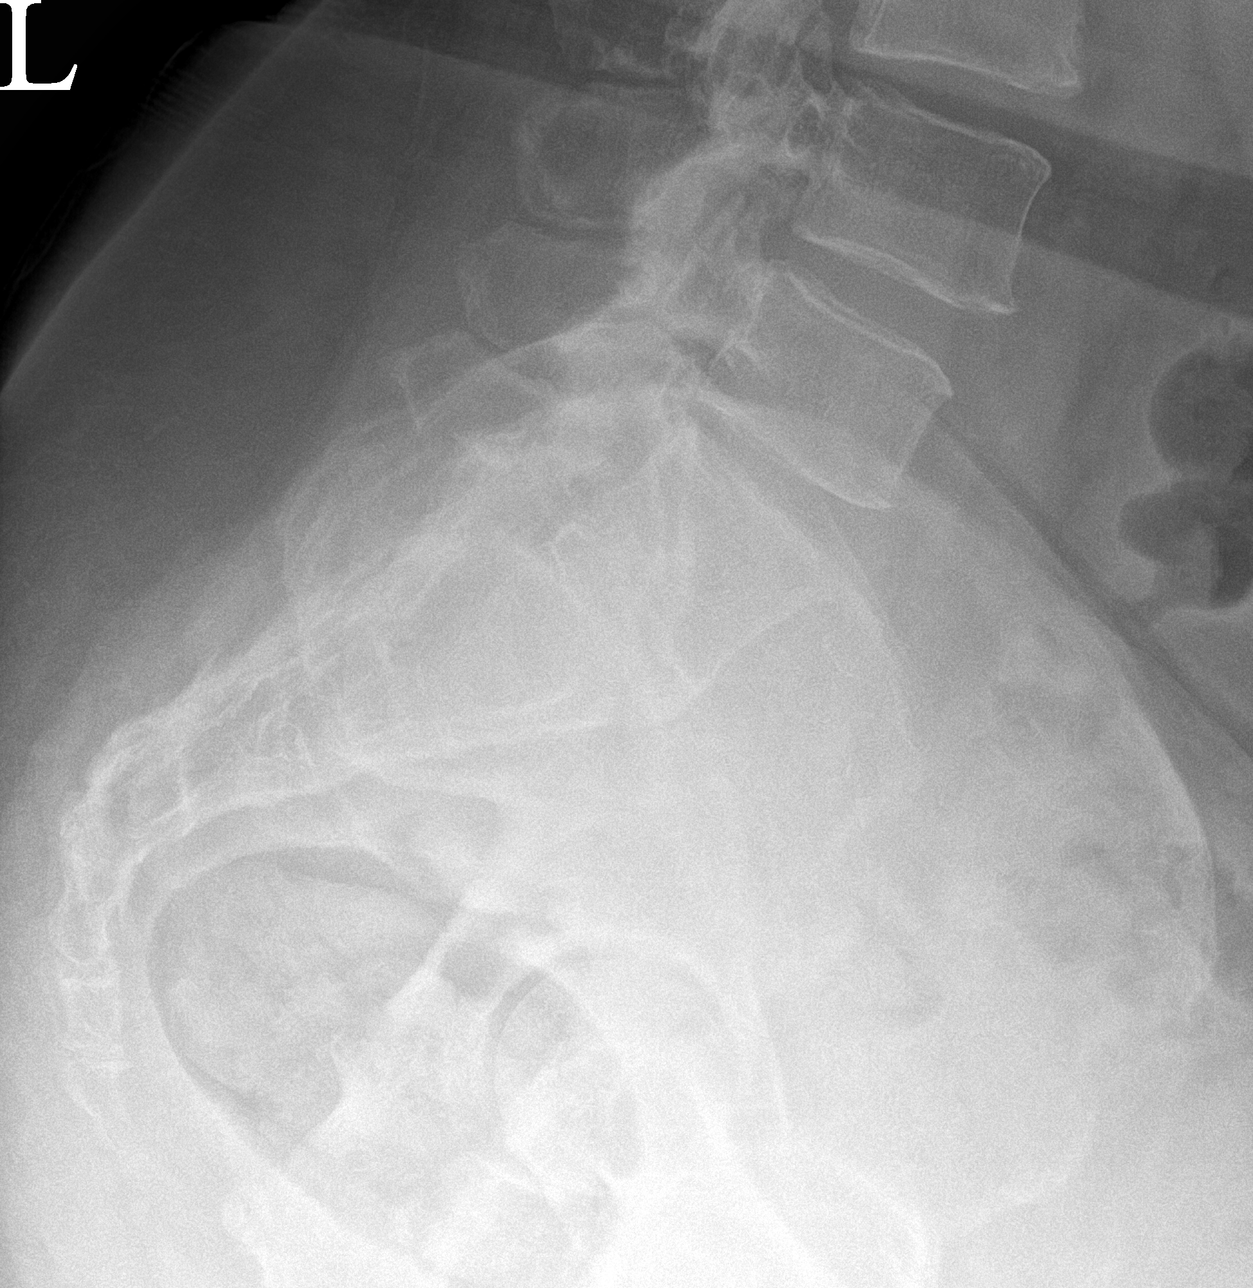

[3 of 3 positions shown; findings below may reference images not displayed]

FINDINGS: Normal lumbar lordosis. No acute fracture or listhesis of the lumbar
spine. Vertebral body height has been preserved. There is mild
endplate remodeling at L1-2, L3-4 and L4-5 in keeping with changes
of minimal degenerative disc disease. Intervertebral disc heights
are preserved. Facet arthrosis at L4-5 bilaterally is not well
profiled on this examination. The paraspinal soft tissues are
unremarkable.
IMPRESSION: No acute fracture or listhesis.

## 2021-09-23 DIAGNOSIS — H40013 Open angle with borderline findings, low risk, bilateral: Secondary | ICD-10-CM | POA: Diagnosis not present

## 2021-09-28 ENCOUNTER — Other Ambulatory Visit (HOSPITAL_COMMUNITY): Payer: Self-pay

## 2021-09-30 ENCOUNTER — Other Ambulatory Visit (HOSPITAL_COMMUNITY): Payer: Self-pay

## 2021-10-02 ENCOUNTER — Telehealth: Payer: 59 | Admitting: Family

## 2021-10-02 DIAGNOSIS — J069 Acute upper respiratory infection, unspecified: Secondary | ICD-10-CM

## 2021-10-02 DIAGNOSIS — R051 Acute cough: Secondary | ICD-10-CM

## 2021-10-02 MED ORDER — FLUTICASONE PROPIONATE 50 MCG/ACT NA SUSP
2.0000 | Freq: Every day | NASAL | 6 refills | Status: DC
  Filled 2021-10-02: qty 16, 30d supply, fill #0

## 2021-10-02 MED ORDER — BENZONATATE 100 MG PO CAPS
100.0000 mg | ORAL_CAPSULE | Freq: Three times a day (TID) | ORAL | 0 refills | Status: DC | PRN
  Filled 2021-10-02: qty 20, 7d supply, fill #0

## 2021-10-02 NOTE — Progress Notes (Signed)

## 2021-10-03 ENCOUNTER — Other Ambulatory Visit: Payer: Self-pay | Admitting: Family Medicine

## 2021-10-03 ENCOUNTER — Other Ambulatory Visit: Payer: Self-pay | Admitting: Internal Medicine

## 2021-10-03 ENCOUNTER — Other Ambulatory Visit (HOSPITAL_COMMUNITY): Payer: Self-pay

## 2021-10-03 DIAGNOSIS — I7300 Raynaud's syndrome without gangrene: Secondary | ICD-10-CM

## 2021-10-03 MED ORDER — SPIRONOLACTONE 50 MG PO TABS
50.0000 mg | ORAL_TABLET | Freq: Three times a day (TID) | ORAL | 3 refills | Status: DC
  Filled 2021-10-03: qty 270, 90d supply, fill #0
  Filled 2022-01-05 – 2022-01-18 (×2): qty 270, 90d supply, fill #1
  Filled 2022-09-10: qty 270, 90d supply, fill #2

## 2021-10-03 NOTE — Telephone Encounter (Signed)
Please call patient to schedule appt    Return in about 3 months (around 09/27/2021) for Raynaud's f/u tadalafil increase f/u 30mo.

## 2021-10-03 NOTE — Telephone Encounter (Signed)
Next Visit: Message sent to front desk to schedule appt. Return in about 3 months (around 09/27/2021) for Raynaud's f/u tadalafil increase f/u 69mo.  Last Visit: 06/27/2021  Last Fill: 06/27/2021  Dx: Raynaud's disease without gangrene   Current Dose per office note on 06/27/2021:   tadalafil (CIALIS) 10 MG tablet      Sig: Take 1 tablet (10 mg total) by mouth daily.      Dispense:  30 tablet      Refill:  2    Okay to refill Cialis?

## 2021-10-04 ENCOUNTER — Other Ambulatory Visit (HOSPITAL_COMMUNITY): Payer: Self-pay

## 2021-10-04 MED ORDER — TADALAFIL 10 MG PO TABS
10.0000 mg | ORAL_TABLET | Freq: Every day | ORAL | 2 refills | Status: DC
  Filled 2021-10-04 – 2021-10-17 (×2): qty 30, 30d supply, fill #0

## 2021-10-05 ENCOUNTER — Encounter: Payer: Self-pay | Admitting: Family Medicine

## 2021-10-05 DIAGNOSIS — N201 Calculus of ureter: Secondary | ICD-10-CM | POA: Diagnosis not present

## 2021-10-05 DIAGNOSIS — R310 Gross hematuria: Secondary | ICD-10-CM | POA: Diagnosis not present

## 2021-10-07 ENCOUNTER — Other Ambulatory Visit (HOSPITAL_COMMUNITY): Payer: Self-pay

## 2021-10-07 ENCOUNTER — Ambulatory Visit: Payer: 59 | Admitting: Internal Medicine

## 2021-10-10 ENCOUNTER — Encounter: Payer: Self-pay | Admitting: Family Medicine

## 2021-10-11 ENCOUNTER — Other Ambulatory Visit (HOSPITAL_COMMUNITY): Payer: Self-pay

## 2021-10-11 ENCOUNTER — Other Ambulatory Visit: Payer: Self-pay

## 2021-10-11 MED ORDER — ALBUTEROL SULFATE HFA 108 (90 BASE) MCG/ACT IN AERS
2.0000 | INHALATION_SPRAY | RESPIRATORY_TRACT | 0 refills | Status: DC | PRN
  Filled 2021-10-11: qty 18, 17d supply, fill #0

## 2021-10-12 ENCOUNTER — Other Ambulatory Visit (HOSPITAL_COMMUNITY): Payer: Self-pay

## 2021-10-12 DIAGNOSIS — H40013 Open angle with borderline findings, low risk, bilateral: Secondary | ICD-10-CM | POA: Diagnosis not present

## 2021-10-12 MED ORDER — BIMATOPROST 0.01 % OP SOLN
1.0000 [drp] | Freq: Every day | OPHTHALMIC | 12 refills | Status: DC
  Filled 2021-10-12: qty 2.5, 25d supply, fill #0
  Filled 2021-11-15: qty 2.5, 25d supply, fill #1
  Filled 2022-03-05: qty 2.5, 25d supply, fill #2
  Filled 2022-09-02: qty 2.5, 25d supply, fill #3
  Filled 2022-09-25: qty 2.5, 25d supply, fill #4

## 2021-10-14 ENCOUNTER — Other Ambulatory Visit (HOSPITAL_COMMUNITY): Payer: Self-pay

## 2021-10-14 ENCOUNTER — Other Ambulatory Visit: Payer: Self-pay | Admitting: Ophthalmology

## 2021-10-14 DIAGNOSIS — N329 Bladder disorder, unspecified: Secondary | ICD-10-CM | POA: Diagnosis not present

## 2021-10-14 DIAGNOSIS — N3080 Other cystitis without hematuria: Secondary | ICD-10-CM | POA: Diagnosis not present

## 2021-10-14 DIAGNOSIS — N132 Hydronephrosis with renal and ureteral calculous obstruction: Secondary | ICD-10-CM | POA: Diagnosis not present

## 2021-10-14 DIAGNOSIS — N201 Calculus of ureter: Secondary | ICD-10-CM | POA: Diagnosis not present

## 2021-10-14 HISTORY — PX: NEPHROLITHOTOMY: SUR881

## 2021-10-14 MED ORDER — TAMSULOSIN HCL 0.4 MG PO CAPS
0.4000 mg | ORAL_CAPSULE | Freq: Every day | ORAL | 1 refills | Status: DC
  Filled 2021-10-14 – 2021-10-17 (×2): qty 10, 10d supply, fill #0

## 2021-10-14 MED ORDER — OXYCODONE HCL 5 MG PO TABS
5.0000 mg | ORAL_TABLET | Freq: Four times a day (QID) | ORAL | 0 refills | Status: DC | PRN
  Filled 2021-10-14: qty 10, 3d supply, fill #0

## 2021-10-15 ENCOUNTER — Other Ambulatory Visit (HOSPITAL_COMMUNITY): Payer: Self-pay

## 2021-10-17 ENCOUNTER — Encounter: Payer: Self-pay | Admitting: Family Medicine

## 2021-10-17 ENCOUNTER — Other Ambulatory Visit (HOSPITAL_COMMUNITY): Payer: Self-pay

## 2021-10-20 DIAGNOSIS — I10 Essential (primary) hypertension: Secondary | ICD-10-CM | POA: Diagnosis not present

## 2021-10-20 DIAGNOSIS — G4733 Obstructive sleep apnea (adult) (pediatric): Secondary | ICD-10-CM | POA: Diagnosis not present

## 2021-10-21 ENCOUNTER — Other Ambulatory Visit (HOSPITAL_COMMUNITY): Payer: Self-pay

## 2021-10-21 DIAGNOSIS — N201 Calculus of ureter: Secondary | ICD-10-CM | POA: Diagnosis not present

## 2021-10-24 ENCOUNTER — Other Ambulatory Visit (HOSPITAL_COMMUNITY): Payer: Self-pay

## 2021-10-24 DIAGNOSIS — Z1231 Encounter for screening mammogram for malignant neoplasm of breast: Secondary | ICD-10-CM | POA: Diagnosis not present

## 2021-10-24 DIAGNOSIS — Z13820 Encounter for screening for osteoporosis: Secondary | ICD-10-CM | POA: Diagnosis not present

## 2021-10-28 ENCOUNTER — Other Ambulatory Visit (HOSPITAL_COMMUNITY): Payer: Self-pay

## 2021-10-31 ENCOUNTER — Ambulatory Visit: Payer: 59 | Admitting: Pulmonary Disease

## 2021-10-31 ENCOUNTER — Encounter: Payer: Self-pay | Admitting: Pulmonary Disease

## 2021-10-31 ENCOUNTER — Other Ambulatory Visit (HOSPITAL_COMMUNITY): Payer: Self-pay

## 2021-10-31 DIAGNOSIS — G4733 Obstructive sleep apnea (adult) (pediatric): Secondary | ICD-10-CM | POA: Diagnosis not present

## 2021-10-31 DIAGNOSIS — E669 Obesity, unspecified: Secondary | ICD-10-CM

## 2021-10-31 NOTE — Assessment & Plan Note (Signed)
CPAP download was reviewed which shows excellent compliance on auto settings 5 to 20 cm with average pressure of 10 and maximum pressure of 11 cm, no residual events and minimal leak. CPAP is only helped improve her daytime somnolence and fatigue.  With her weight loss it is possible that OSA is significantly improved however during her recent surgery, events were noted and therefore she still requires CPAP.  Hence we will defer repeat sleep testing.  Weight loss encouraged, compliance with goal of at least 4-6 hrs every night is the expectation. Advised against medications with sedative side effects Cautioned against driving when sleepy - understanding that sleepiness will vary on a day to day basis

## 2021-10-31 NOTE — Addendum Note (Signed)
Addended by: Konrad Felix L on: 10/31/2021 01:49 PM   Modules accepted: Orders

## 2021-10-31 NOTE — Assessment & Plan Note (Signed)
Significant weight loss.  She is now having hypoglycemia post bypass surgery We discussed repeat sleep testing in the future

## 2021-10-31 NOTE — Progress Notes (Signed)
   Subjective:    Patient ID: [name], female    DOB: [birth year], 61 y.o.   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 She was diagnosed in 2008 -used to see Dr. Gwenette Greet.  This was also noted in the perioperative setting in 2018 and since then she has been very compliant with her CPAP machine. 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  She has been maintained on Provigil for 100 mg for many years.  Last office visit with APP was reviewed 07/2020.  She was provided with a new CPAP machine set at 5 to 20 cm.  She is very compliant and recently underwent ureteral stent placement for renal calculi.  She was noted by anesthesiologist to have apneas and CPAP compliance was emphasized She feels rested in the daytime and denies sleepiness or fatigue She is having hypoglycemic episodes at night and is taking sugar pills  Medication list was clarified  She quit smoking 15 years ago, we reviewed low-dose CT She does not have COPD and this was removed from her problem list  Significant tests/ events reviewed  2008 HST AHI of 12.    PFTs 2017-no airway obstruction, TLC 79%, DLCO 74%  LDCT 10/2020 mild emphysema   Review of Systems neg for any significant sore throat, dysphagia, itching, sneezing, nasal congestion or excess/ purulent secretions, fever, chills, sweats, unintended wt loss, pleuritic or exertional cp, hempoptysis, orthopnea pnd or change in chronic leg swelling. Also denies presyncope, palpitations, heartburn, abdominal pain, nausea, vomiting, diarrhea or change in bowel or urinary habits, dysuria,hematuria, rash, arthralgias, visual complaints, headache, numbness weakness or ataxia.     Objective:   Physical Exam  Gen. Pleasant, obese, in no distress ENT - no lesions, no post nasal drip Neck: No JVD, no thyromegaly, no carotid bruits Lungs: no use of accessory muscles, no  dullness to percussion, decreased without rales or rhonchi  Cardiovascular: Rhythm regular, heart sounds  normal, no murmurs or gallops, no peripheral edema Musculoskeletal: No deformities, no cyanosis or clubbing , no tremors Skin-vitiligo      Assessment & Plan:   Hypersomnolence -we can consider decreasing dose of Provigil.

## 2021-10-31 NOTE — Patient Instructions (Addendum)
  X change to autoCPAP 5- 12 cm  CPAP is working well  Continue on provigil

## 2021-11-03 ENCOUNTER — Other Ambulatory Visit (HOSPITAL_COMMUNITY): Payer: Self-pay

## 2021-11-03 MED ORDER — LUBIPROSTONE 24 MCG PO CAPS
24.0000 ug | ORAL_CAPSULE | Freq: Two times a day (BID) | ORAL | 0 refills | Status: DC
  Filled 2021-11-03: qty 60, 30d supply, fill #0

## 2021-11-04 NOTE — Progress Notes (Unsigned)
Subjective:    Patient ID: [name], female    DOB: April 30, 1960, 61 y.o.   MRN: [id number]  No chief complaint on file.   HPI Patient is in today for a CPE.  Past Medical History:  Diagnosis Date   Acute bronchitis 05/25/2016   Anxiety    Arthritis    Chronic pain syndrome 01/06/2013   Chronic rhinosinusitis    Colon polyps    Cough 01/06/2013   Depression    Diabetes ([place]) 01/06/2013   Diabetes mellitus type 2 in obese [facility]) 01/06/2013   Sees Dr Calvert Cantor for eye exam Does not see podiatry, foot exam today unremarkable except for thick cracking skin on heals  pt denies    Dyspnea    with exertion    Dysuria 05/25/2016   Edema 01/06/2013   Epistaxis 05/25/2016   Fibroid, uterine    Fibromyalgia    GERD (gastroesophageal reflux disease)    Glaucoma 12-13   Hoarseness    Hoarseness of voice 05/11/2013   Hyperglycemia 04/13/2013   Hyperlipemia, mixed 06/06/2007   Qualifier: Diagnosis of  By: Wynona Luna She feels Lipitor caused increased low back pain and weakness     Hyperlipidemia    Hypertension    Hypokalemia 02/05/2013   IBS (irritable bowel syndrome) 02/13/2011   Low back pain 03/03/2013   Muscle cramp 05/22/2014   Obesity    Obesity, unspecified 05/11/2013   OSA (obstructive sleep apnea)    cpap    Pain in joint, shoulder region 06/27/2015   Perimenopause 10/05/2012   Pre-diabetes    Raynaud disease 06/16/2013   Thyroid disease    hypothyroidism   Vocal fold nodules     Past Surgical History:  Procedure Laterality Date   ABDOMINAL HYSTERECTOMY     BREAST EXCISIONAL BIOPSY Right    at age 43   CHOLECYSTECTOMY     colonoscopoy      [place]     x2   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   Procedure: LAPAROSCOPIC ROUX-EN-Y GASTRIC BYPASS WITH UPPER ENDOSCOPY;  Surgeon: [name], MD;  Location: [facility];  Service: General;  Laterality: N/A;   I & D EXTREMITY Left 04/11/2018   Procedure: DEBIDMENT DISTAL  INTERPHALANGEAL LEFT MIDDLE;  Surgeon: Daryll Brod, MD;  Location: [facility];  Service: Orthopedics;  Laterality: Left;   INCISIONAL HERNIA REPAIR  08/10/2020   Procedure: LAPAROSCOPIC PRIMARY REPAIR OF INCISIONAL HERNIA;  Surgeon: [name], MD;  Location: [facility];  Service: General;;   MASS EXCISION Left 04/11/2018   Procedure: EXCISION MASS;  Surgeon: Daryll Brod, MD;  Location: [facility];  Service: Orthopedics;  Laterality: Left;   OOPHORECTOMY     POLYPECTOMY     ROTATOR CUFF REPAIR     UPPER GI ENDOSCOPY N/A 08/10/2020   Procedure: UPPER GI ENDOSCOPY;  Surgeon: [name], MD;  Location: [facility];  Service: General;  Laterality: N/A;    Family History  Problem Relation Age of Onset   Alcohol abuse Father    Cancer Father        renal and colon   Hyperlipidemia Father    Hypertension Father    Stroke Father    Heart disease Father    Cancer Paternal Grandfather        colon   Diabetes Other    High blood pressure Mother    High Cholesterol Mother  Kidney disease Mother    Depression Mother    Anxiety disorder Mother    Obesity Mother    Breast cancer Other 61   Diabetes Sister    Diabetes Brother     Social History   Socioeconomic History   Marital status: Significant Other    Spouse name: Not on file   Number of children: 0   Years of education: Not on file   Highest education level: Not on file  Occupational History   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  Tobacco Use   Smoking status: Former    Packs/day: 0.75    Years: 42.00    Total pack years: 31.50    Types: Cigarettes    Quit date: 04/24/2005    Years since quitting: 16.5   Smokeless tobacco: Never   Tobacco comments:    smoked since age 42  Vaping Use   Vaping Use: Never used  Substance and Sexual Activity   Alcohol use: No   Drug use: Never   Sexual activity: Yes    Partners: Male  Other Topics Concern   Not on file  Social History Narrative   Endo-- Dr  Dwyane Dee   ENT--Dr shoemaker   GI--Dr [place]   Pulm--Dr Clance   Rheum--Dr Charlestine Night         Social Determinants of Health   Financial Resource Strain: Not on file  Food Insecurity: Not on file  Transportation Needs: Not on file  Physical Activity: Not on file  Stress: Not on file  Social Connections: Not on file  Intimate Partner Violence: Not on file    Outpatient Medications Prior to Visit  Medication Sig Dispense Refill   acarbose (PRECOSE) 25 MG tablet Take 1 tablet (25 mg total) by mouth 3 (three) times daily with meals. 90 tablet 11   albuterol (VENTOLIN HFA) 108 (90 Base) MCG/ACT inhaler Inhale 2 puffs into the lungs every 4 (four) hours as needed for wheezing or shortness of breath. 18 g 0   azelastine (ASTELIN) 0.1 % nasal spray Place 1-2 sprays into both nostrils 2 (two) times daily. (Patient not taking: Reported on 10/31/2021) 30 mL 12   benzonatate (TESSALON PERLES) 100 MG capsule Take 1 capsule (100 mg total) by mouth 3 (three) times daily as needed. (Patient not taking: Reported on 10/31/2021) 20 capsule 0   bimatoprost (LUMIGAN) 0.01 % SOLN Place 1 drop into both eyes daily. 2.5 mL 12   busPIRone (BUSPAR) 7.5 MG tablet Take 1 tablet (7.5 mg total) by mouth 2 (two) times daily. 60 tablet 1   calcium carbonate (OS-CAL - DOSED IN MG OF ELEMENTAL CALCIUM) 1250 (500 Ca) MG tablet      Calcium Carbonate 500 MG CHEW Chew 500 mg by mouth daily at 12 noon.     Cholecalciferol 25 MCG (1000 UT) CHEW Chew by mouth. (Patient not taking: Reported on 10/31/2021)     Evolocumab (REPATHA SURECLICK) 102 MG/ML SOAJ Inject 140 mg into the skin every 14 (fourteen) days. 6 mL 3   fluticasone (FLONASE) 50 MCG/ACT nasal spray Place 2 sprays into both nostrils daily. 16 g 6   furosemide (LASIX) 20 MG tablet Take 1 tablet (20 mg total) by mouth daily as needed. 90 tablet 1   gabapentin (NEURONTIN) 100 MG capsule Take 1-3 capsules (100-300 mg total) by mouth 3 (three) times daily as needed. (Patient  not taking: Reported on 10/31/2021) 90 capsule 3   ipratropium (ATROVENT) 0.06 % nasal spray Place 2 sprays into both  nostrils 3 (three) times daily as needed for rhinitis 15 mL 12   ipratropium (ATROVENT) 0.06 % nasal spray Place 2 sprays into both nostrils 3 (three) times daily as needed for Rhinitis 15 mL 12   loratadine (CLARITIN) 10 MG tablet Take 20 mg by mouth daily.     lubiprostone (AMITIZA) 24 MCG capsule Take 1 capsule (24 mcg total) by mouth 2 (two) times daily with food and water 60 capsule 0   lubiprostone (AMITIZA) 24 MCG capsule Take 1 capsule (24 mcg total) by mouth 2 (two) times daily with food and water 60 capsule 0   metoprolol tartrate (LOPRESSOR) 25 MG tablet Take 0.5 tablets (12.5 mg total) by mouth 2 (two) times daily. 60 tablet 0   Milnacipran (SAVELLA) 50 MG TABS tablet Take 1 tablet (50 mg total) by mouth 2 (two) times daily. 60 tablet 5   modafinil (PROVIGIL) 200 MG tablet Take 2 tablets (400 mg total) by mouth daily. 60 tablet 5   Multiple Vitamins-Minerals (MULTIVITAMIN WITH MINERALS) tablet Take 1 tablet by mouth daily.     NIFEdipine (PROCARDIA-XL/NIFEDICAL-XL) 30 MG 24 hr tablet Take 1 tablet (30 mg total) by mouth daily. 30 tablet 3   pantoprazole (PROTONIX) 40 MG tablet Take 1 tablet (40 mg total) by mouth daily. 90 tablet 0   spironolactone (ALDACTONE) 50 MG tablet Take 1 tablet (50 mg total) by mouth 3 (three) times daily. 270 tablet 3   thyroid (ARMOUR) 90 MG tablet NP Thyroid 90 mg tablet   90 mg every day by oral route.     Vitamin D, Ergocalciferol, (DRISDOL) 1.25 MG (50000 UNIT) CAPS capsule Take 1 capsule (50,000 Units total) by mouth every 7 (seven) days. 12 capsule 1   Facility-Administered Medications Prior to Visit  Medication Dose Route Frequency Provider Last Rate Last Admin   NIFEdipine (PROCARDIA-XL/NIFEDICAL-XL) 24 hr tablet 30 mg  30 mg Oral Daily Broadus John, MD        Allergies  Allergen Reactions   Bupropion Other (See Comments)     Dizziness and mental status changes Other reaction(s): Dizziness   Crestor [Rosuvastatin] Other (See Comments)    Myalgias and rising CPK   Losartan Cough   Atorvastatin     myalgias   Simvastatin     NDC Code:16714068101 NDC Code:16714068101 NDC PJSR:[id number] Other reaction(s): Myalgias (Muscle Pain)   Amoxicillin Rash   Aspirin Nausea Only and Other (See Comments)    GI upset REACTION: Upset stomach Other reaction(s): Vomiting   Codeine Rash   Erythromycin Rash and Other (See Comments)   Penicillins Rash    Reaction: 15 years    ROS     Objective:    Physical Exam  There were no vitals taken for this visit. Wt Readings from Last 3 Encounters:  10/31/21 170 lb 3.2 oz (77.2 kg)  08/04/21 174 lb (78.9 kg)  07/11/21 182 lb 6.4 oz (82.7 kg)    Diabetic Foot Exam - Simple   No data filed    Lab Results  Component Value Date   WBC 4.4 06/24/2021   HGB 13.0 06/24/2021   HCT 39.1 06/24/2021   PLT 298.0 06/24/2021   GLUCOSE 90 07/19/2021   CHOL 102 05/09/2021   TRIG 69.0 05/09/2021   HDL 58.80 05/09/2021   LDLDIRECT 159.0 11/11/2019   LDLCALC 30 05/09/2021   ALT 57 (H) 07/19/2021   AST 38 (H) 07/19/2021   NA 142 07/19/2021   K 4.2 07/19/2021   CL 105  07/19/2021   CREATININE 0.76 07/19/2021   BUN 9 07/19/2021   CO2 29 07/19/2021   TSH 0.32 (L) 05/09/2021   HGBA1C 5.7 05/09/2021   MICROALBUR 0.8 05/09/2021    Lab Results  Component Value Date   TSH 0.32 (L) 05/09/2021   Lab Results  Component Value Date   WBC 4.4 06/24/2021   HGB 13.0 06/24/2021   HCT 39.1 06/24/2021   MCV 90.8 06/24/2021   PLT 298.0 06/24/2021   Lab Results  Component Value Date   NA 142 07/19/2021   K 4.2 07/19/2021   CO2 29 07/19/2021   GLUCOSE 90 07/19/2021   BUN 9 07/19/2021   CREATININE 0.76 07/19/2021   BILITOT 0.4 07/19/2021   ALKPHOS 79 07/19/2021   AST 38 (H) 07/19/2021   ALT 57 (H) 07/19/2021   PROT 6.4 07/19/2021   ALBUMIN 4.2 07/19/2021   CALCIUM 9.6  07/19/2021   ANIONGAP 7 08/16/2020   EGFR 58 (L) 06/22/2020   GFR 85.04 07/19/2021   Lab Results  Component Value Date   CHOL 102 05/09/2021   Lab Results  Component Value Date   HDL 58.80 05/09/2021   Lab Results  Component Value Date   LDLCALC 30 05/09/2021   Lab Results  Component Value Date   TRIG 69.0 05/09/2021   Lab Results  Component Value Date   CHOLHDL 2 05/09/2021   Lab Results  Component Value Date   HGBA1C 5.7 05/09/2021       Assessment & Plan:   COLONOSCOPY:07/28/20 MAMMO: 10/01/20 PAP: 05/26/20 PSA: n/a DEXA: 5/21, normal   Problem List Items Addressed This Visit   None   I am having [name] maintain her loratadine, multivitamin with minerals, Calcium Carbonate, gabapentin, ipratropium, lubiprostone, thyroid, Cholecalciferol, calcium carbonate, NIFEdipine, azelastine, modafinil, acarbose, Vitamin D (Ergocalciferol), Repatha SureClick, furosemide, Savella, pantoprazole, busPIRone, metoprolol tartrate, ipratropium, fluticasone, benzonatate, spironolactone, albuterol, bimatoprost, and lubiprostone. We will continue to administer NIFEdipine.  No orders of the defined types were placed in this encounter.

## 2021-11-07 ENCOUNTER — Other Ambulatory Visit (HOSPITAL_COMMUNITY): Payer: Self-pay

## 2021-11-07 ENCOUNTER — Ambulatory Visit (INDEPENDENT_AMBULATORY_CARE_PROVIDER_SITE_OTHER): Payer: 59 | Admitting: Family Medicine

## 2021-11-07 ENCOUNTER — Encounter: Payer: Self-pay | Admitting: Family Medicine

## 2021-11-07 ENCOUNTER — Ambulatory Visit (HOSPITAL_BASED_OUTPATIENT_CLINIC_OR_DEPARTMENT_OTHER)
Admission: RE | Admit: 2021-11-07 | Discharge: 2021-11-07 | Disposition: A | Discharge status: Home / Self Care | Payer: 59 | Source: Ambulatory Visit | Attending: Family Medicine | Admitting: Family Medicine

## 2021-11-07 VITALS — BP 118/80 | HR 88 | Resp 20 | Ht 64.5 in | Wt 174.2 lb

## 2021-11-07 DIAGNOSIS — E559 Vitamin D deficiency, unspecified: Secondary | ICD-10-CM

## 2021-11-07 DIAGNOSIS — E1169 Type 2 diabetes mellitus with other specified complication: Secondary | ICD-10-CM

## 2021-11-07 DIAGNOSIS — E079 Disorder of thyroid, unspecified: Secondary | ICD-10-CM

## 2021-11-07 DIAGNOSIS — M542 Cervicalgia: Secondary | ICD-10-CM | POA: Diagnosis not present

## 2021-11-07 DIAGNOSIS — R748 Abnormal levels of other serum enzymes: Secondary | ICD-10-CM

## 2021-11-07 DIAGNOSIS — B354 Tinea corporis: Secondary | ICD-10-CM | POA: Diagnosis not present

## 2021-11-07 DIAGNOSIS — Z0000 Encounter for general adult medical examination without abnormal findings: Secondary | ICD-10-CM | POA: Diagnosis not present

## 2021-11-07 DIAGNOSIS — E782 Mixed hyperlipidemia: Secondary | ICD-10-CM

## 2021-11-07 DIAGNOSIS — E538 Deficiency of other specified B group vitamins: Secondary | ICD-10-CM

## 2021-11-07 DIAGNOSIS — R252 Cramp and spasm: Secondary | ICD-10-CM

## 2021-11-07 DIAGNOSIS — I10 Essential (primary) hypertension: Secondary | ICD-10-CM | POA: Diagnosis not present

## 2021-11-07 DIAGNOSIS — N200 Calculus of kidney: Secondary | ICD-10-CM

## 2021-11-07 DIAGNOSIS — E039 Hypothyroidism, unspecified: Secondary | ICD-10-CM

## 2021-11-07 DIAGNOSIS — E669 Obesity, unspecified: Secondary | ICD-10-CM

## 2021-11-07 DIAGNOSIS — I7300 Raynaud's syndrome without gangrene: Secondary | ICD-10-CM

## 2021-11-07 LAB — COMPREHENSIVE METABOLIC PANEL
ALT: 76 U/L — ABNORMAL HIGH (ref 0–35)
AST: 51 U/L — ABNORMAL HIGH (ref 0–37)
Albumin: 3.9 g/dL (ref 3.5–5.2)
Alkaline Phosphatase: 87 U/L (ref 39–117)
BUN: 8 mg/dL (ref 6–23)
CO2: 28 mEq/L (ref 19–32)
Calcium: 9 mg/dL (ref 8.4–10.5)
Chloride: 104 mEq/L (ref 96–112)
Creatinine, Ser: 0.61 mg/dL (ref 0.40–1.20)
GFR: 96.82 mL/min (ref >60.00)
Glucose, Bld: 71 mg/dL (ref 70–99)
Potassium: 4.1 mEq/L (ref 3.5–5.1)
Sodium: 141 mEq/L (ref 135–145)
Total Bilirubin: 0.2 mg/dL (ref 0.2–1.2)
Total Protein: 6 g/dL (ref 6.0–8.3)

## 2021-11-07 LAB — CBC
HCT: 37.5 % (ref 36.0–46.0)
Hemoglobin: 12.3 g/dL (ref 12.0–15.0)
MCHC: 32.8 g/dL (ref 30.0–36.0)
MCV: 91 fl (ref 78.0–100.0)
Platelets: 247 10*3/uL (ref 150.0–400.0)
RBC: 4.12 Mil/uL (ref 3.87–5.11)
RDW: 14 % (ref 11.5–15.5)
WBC: 3.5 10*3/uL — ABNORMAL LOW (ref 4.0–10.5)

## 2021-11-07 LAB — LIPID PANEL
Cholesterol: 102 mg/dL (ref 0–200)
HDL: 61.4 mg/dL (ref >39.00)
LDL Cholesterol: 28 mg/dL (ref 0–99)
NonHDL: 41.05
Total CHOL/HDL Ratio: 2
Triglycerides: 64 mg/dL (ref 0.0–149.0)
VLDL: 12.8 mg/dL (ref 0.0–40.0)

## 2021-11-07 LAB — SEDIMENTATION RATE: Sed Rate: 23 mm/hr (ref 0–30)

## 2021-11-07 LAB — MAGNESIUM: Magnesium: 1.9 mg/dL (ref 1.5–2.5)

## 2021-11-07 LAB — TSH: TSH: 0.06 u[IU]/mL — ABNORMAL LOW (ref 0.35–5.50)

## 2021-11-07 LAB — HEMOGLOBIN A1C: Hgb A1c MFr Bld: 6.1 % (ref 4.6–6.5)

## 2021-11-07 LAB — CK: Total CK: 170 U/L (ref 7–177)

## 2021-11-07 MED ORDER — ECONAZOLE NITRATE 1 % EX CREA
TOPICAL_CREAM | Freq: Every day | CUTANEOUS | 1 refills | Status: DC
  Filled 2021-11-07: qty 30, 14d supply, fill #0

## 2021-11-07 NOTE — Assessment & Plan Note (Signed)
Encourage heart healthy diet such as MIND or DASH diet, increase exercise, avoid trans fats, simple carbohydrates and processed foods, consider a krill or fish or flaxseed oil cap daily.  °

## 2021-11-07 NOTE — Assessment & Plan Note (Signed)
Seen in ER with stones, is feeling better. Hydrate well add lemon to water and f/u with urology if recurrent

## 2021-11-07 NOTE — Assessment & Plan Note (Signed)
Lower legs, try Econazole cream daily if no improvement then refer to derm

## 2021-11-07 NOTE — Patient Instructions (Signed)
Yerba Matte tea do not drink   Preventive Care 31-61 Years Old, Female Preventive care refers to lifestyle choices and visits with your health care provider that can promote health and wellness. Preventive care visits are also called wellness exams. What can I expect for my preventive care visit? Counseling Your health care provider may ask you questions about your: Medical history, including: Past medical problems. Family medical history. Pregnancy history. Current health, including: Menstrual cycle. Method of birth control. Emotional well-being. Home life and relationship well-being. Sexual activity and sexual health. Lifestyle, including: Alcohol, nicotine or tobacco, and drug use. Access to firearms. Diet, exercise, and sleep habits. Work and work Statistician. Sunscreen use. Safety issues such as seatbelt and bike helmet use. Physical exam Your health care provider will check your: Height and weight. These may be used to calculate your BMI (body mass index). BMI is a measurement that tells if you are at a healthy weight. Waist circumference. This measures the distance around your waistline. This measurement also tells if you are at a healthy weight and may help predict your risk of certain diseases, such as type 2 diabetes and high blood pressure. Heart rate and blood pressure. Body temperature. Skin for abnormal spots. What immunizations do I need?  Vaccines are usually given at various ages, according to a schedule. Your health care provider will recommend vaccines for you based on your age, medical history, and lifestyle or other factors, such as travel or where you work. What tests do I need? Screening Your health care provider may recommend screening tests for certain conditions. This may include: Lipid and cholesterol levels. Diabetes screening. This is done by checking your blood sugar (glucose) after you have not eaten for a while (fasting). Pelvic exam and Pap  test. Hepatitis B test. Hepatitis C test. HIV (human immunodeficiency virus) test. STI (sexually transmitted infection) testing, if you are at risk. Lung cancer screening. Colorectal cancer screening. Mammogram. Talk with your health care provider about when you should start having regular mammograms. This may depend on whether you have a family history of breast cancer. BRCA-related cancer screening. This may be done if you have a family history of breast, ovarian, tubal, or peritoneal cancers. Bone density scan. This is done to screen for osteoporosis. Talk with your health care provider about your test results, treatment options, and if necessary, the need for more tests. Follow these instructions at home: Eating and drinking  Eat a diet that includes fresh fruits and vegetables, whole grains, lean protein, and low-fat dairy products. Take vitamin and mineral supplements as recommended by your health care provider. Do not drink alcohol if: Your health care provider tells you not to drink. You are pregnant, may be pregnant, or are planning to become pregnant. If you drink alcohol: Limit how much you have to 0-1 drink a day. Know how much alcohol is in your drink. In the [place] one drink equals one 12 oz bottle of beer (355 mL), one 5 oz glass of wine (148 mL), or one 1 oz glass of hard liquor (44 mL). Lifestyle Brush your teeth every morning and night with fluoride toothpaste. Floss one time each day. Exercise for at least 30 minutes 5 or more days each week. Do not use any products that contain nicotine or tobacco. These products include cigarettes, chewing tobacco, and vaping devices, such as e-cigarettes. If you need help quitting, ask your health care provider. Do not use drugs. If you are sexually active, practice safe sex. Use  a condom or other form of protection to prevent STIs. If you do not wish to become pregnant, use a form of birth control. If you plan to become pregnant,  see your health care provider for a prepregnancy visit. Take aspirin only as told by your health care provider. Make sure that you understand how much to take and what form to take. Work with your health care provider to find out whether it is safe and beneficial for you to take aspirin daily. Find healthy ways to manage stress, such as: Meditation, yoga, or listening to music. Journaling. Talking to a trusted person. Spending time with friends and family. Minimize exposure to UV radiation to reduce your risk of skin cancer. Safety Always wear your seat belt while driving or riding in a vehicle. Do not drive: If you have been drinking alcohol. Do not ride with someone who has been drinking. When you are tired or distracted. While texting. If you have been using any mind-altering substances or drugs. Wear a helmet and other protective equipment during sports activities. If you have firearms in your house, make sure you follow all gun safety procedures. Seek help if you have been physically or sexually abused. What's next? Visit your health care provider once a year for an annual wellness visit. Ask your health care provider how often you should have your eyes and teeth checked. Stay up to date on all vaccines. This information is not intended to replace advice given to you by your health care provider. Make sure you discuss any questions you have with your health care provider. Document Revised: 10/06/2020 Document Reviewed: 10/06/2020 Elsevier Patient Education  [place].   Kidney Stones  Kidney stones are solid, rock-like deposits that form inside of the kidneys. The kidneys are a pair of organs that make urine. A kidney stone may form in a kidney and move into other parts of the urinary tract, including the tubes that connect the kidneys to the bladder (ureters), the bladder, and the tube that carries urine out of the body (urethra). As the stone moves through these areas, it  can cause intense pain and block the flow of urine. Kidney stones are created when high levels of certain minerals are found in the urine. The stones are usually passed out of the body through urination, but in some cases, medical treatment may be needed to remove them. What are the causes? Kidney stones may be caused by: A condition in which certain glands produce too much parathyroid hormone (primary hyperparathyroidism), which causes too much calcium buildup in the blood. A buildup of uric acid crystals in the bladder (hyperuricosuria). Uric acid is a chemical that the body produces when you eat certain foods. It usually exits the body in the urine. Narrowing (stricture) of one or both of the ureters. A kidney blockage that is present at birth (congenital obstruction). Past surgery on the kidney or the ureters, such as gastric bypass surgery. What increases the risk? The following factors may make you more likely to develop this condition: Having had a kidney stone in the past. Having a family history of kidney stones. Not drinking enough water. Eating a diet that is high in protein, salt (sodium), or sugar. Being overweight or obese. What are the signs or symptoms? Symptoms of a kidney stone may include: Pain in the side of the abdomen, right below the ribs (flank pain). Pain usually spreads (radiates) to the groin. Needing to urinate frequently or urgently. Painful urination. Blood  in the urine (hematuria). Nausea. Vomiting. Fever and chills. How is this diagnosed? This condition may be diagnosed based on: Your symptoms and medical history. A physical exam. Blood tests. Urine tests. These may be done before and after the stone passes out of your body through urination. Imaging tests, such as a CT scan, abdominal X-ray, or ultrasound. A procedure to examine the inside of the bladder (cystoscopy). How is this treated? Treatment for kidney stones depends on the size, location, and  makeup of the stones. Kidney stones will often pass out of the body through urination. You may need to: Increase your fluid intake to help pass the stone. In some cases, you may be given fluids through an IV and may need to be monitored at the hospital. Take medicine for pain. Make changes in your diet to help prevent kidney stones from coming back. Sometimes, medical procedures are needed to remove a kidney stone. This may involve: A procedure to break up kidney stones using: A focused beam of light (laser therapy). Shock waves (extracorporeal shock wave lithotripsy). Surgery to remove kidney stones. This may be needed if you have severe pain or have stones that block your urinary tract. Follow these instructions at home: Medicines Take over-the-counter and prescription medicines only as told by your health care provider. Ask your health care provider if the medicine prescribed to you requires you to avoid driving or using heavy machinery. Eating and drinking Drink enough fluid to keep your urine pale yellow. You may be instructed to drink at least 8-10 glasses of water each day. This will help you pass the kidney stone. If directed, change your diet. This may include: Limiting how much sodium you eat. Eating more fruits and vegetables. Limiting how much animal protein--such as red meat, poultry, fish, and eggs--you eat. Follow instructions from your health care provider about eating or drinking restrictions. General instructions Collect urine samples as told by your health care provider. You may need to collect a urine sample: 24 hours after you pass the stone. 8-12 weeks after passing the kidney stone, and every 6-12 months after that. Strain your urine every time you urinate, for as long as directed. Use the strainer that your health care provider recommends. Do not throw out the kidney stone after passing it. Keep the stone so it can be tested by your health care provider. Testing the  makeup of your kidney stone may help prevent you from getting kidney stones in the future. Keep all follow-up visits as told by your health care provider. This is important. You may need follow-up X-rays or ultrasounds to make sure that your stone has passed. How is this prevented? To prevent another kidney stone: Drink enough fluid to keep your urine pale yellow. This is the best way to prevent kidney stones. Eat a healthy diet and follow recommendations from your health care provider about foods to avoid. You may be instructed to eat a low-protein diet. Recommendations vary depending on the type of kidney stone that you have. Maintain a healthy weight. Where to find more information [place] [facility]): www.kidney.[facility]): www.urologyhealth.org Contact a health care provider if: You have pain that gets worse or does not get better with medicine. Get help right away if: You have a fever or chills. You develop severe pain. You develop new abdominal pain. You faint. You are unable to urinate. Summary Kidney stones are solid, rock-like deposits that form inside of the kidneys. Kidney stones can cause  nausea, vomiting, blood in the urine, abdominal pain, and the urge to urinate frequently. Treatment for kidney stones depends on the size, location, and makeup of the stones. Kidney stones will often pass out of the body through urination. Kidney stones can be prevented by drinking enough fluids, eating a healthy diet, and maintaining a healthy weight. This information is not intended to replace advice given to you by your health care provider. Make sure you discuss any questions you have with your health care provider. Document Revised: 12/29/2020 Document Reviewed: 12/13/2020 Elsevier Patient Education  [place].

## 2021-11-07 NOTE — Assessment & Plan Note (Signed)
Supplement and monitor 

## 2021-11-07 NOTE — Assessment & Plan Note (Signed)
hgba1c acceptable, minimize simple carbs. Increase exercise as tolerated. Continue current meds 

## 2021-11-07 NOTE — Assessment & Plan Note (Signed)
On Levothyroxine, continue to monitor 

## 2021-11-07 NOTE — Assessment & Plan Note (Signed)
Hydrate and monitor 

## 2021-11-07 NOTE — Assessment & Plan Note (Signed)
Patient encouraged to maintain heart healthy diet, regular exercise, adequate sleep. Consider daily probiotics. Take medications as prescribed. Labs ordered and reviewed  COLONOSCOPY:07/28/20 with [facility], repeat in 2027 MAMMO: 10/01/20, repeat in next year  PAP: 05/26/20 repeat 2025 to 2027  DEXA: 5/21, normal, repeat 2026

## 2021-11-07 NOTE — Assessment & Plan Note (Signed)
Good weight loss over the past 6 months.

## 2021-11-07 NOTE — Assessment & Plan Note (Addendum)
Is working with rheumatology, neurology, consider CTS could be making it worse. Notes she is symptomatic even in hot weather. Notes decrease in sensation.

## 2021-11-07 NOTE — Assessment & Plan Note (Signed)
Well controlled, no changes to meds. Encouraged heart healthy diet such as the DASH diet and exercise as tolerated.  °

## 2021-11-08 ENCOUNTER — Encounter: Payer: Self-pay | Admitting: Family Medicine

## 2021-11-09 ENCOUNTER — Ambulatory Visit: Payer: 59 | Admitting: Skilled Nursing Facility1

## 2021-11-14 ENCOUNTER — Other Ambulatory Visit (HOSPITAL_COMMUNITY): Payer: Self-pay

## 2021-11-14 DIAGNOSIS — R200 Anesthesia of skin: Secondary | ICD-10-CM | POA: Diagnosis not present

## 2021-11-14 DIAGNOSIS — R531 Weakness: Secondary | ICD-10-CM | POA: Diagnosis not present

## 2021-11-14 DIAGNOSIS — M542 Cervicalgia: Secondary | ICD-10-CM | POA: Diagnosis not present

## 2021-11-14 DIAGNOSIS — R202 Paresthesia of skin: Secondary | ICD-10-CM | POA: Diagnosis not present

## 2021-11-14 MED ORDER — GABAPENTIN 300 MG PO CAPS
300.0000 mg | ORAL_CAPSULE | Freq: Every evening | ORAL | 1 refills | Status: DC
  Filled 2021-11-14: qty 60, 30d supply, fill #0
  Filled 2021-12-15: qty 60, 30d supply, fill #1

## 2021-11-15 ENCOUNTER — Other Ambulatory Visit: Payer: Self-pay | Admitting: Vascular Surgery

## 2021-11-15 ENCOUNTER — Other Ambulatory Visit (HOSPITAL_COMMUNITY): Payer: Self-pay

## 2021-11-16 ENCOUNTER — Other Ambulatory Visit (HOSPITAL_COMMUNITY): Payer: Self-pay

## 2021-11-16 NOTE — Telephone Encounter (Signed)
Contact PCP for refill

## 2021-11-18 ENCOUNTER — Other Ambulatory Visit (HOSPITAL_COMMUNITY): Payer: Self-pay

## 2021-11-18 ENCOUNTER — Other Ambulatory Visit: Payer: Self-pay

## 2021-11-18 ENCOUNTER — Encounter: Payer: Self-pay | Admitting: Vascular Surgery

## 2021-11-18 MED ORDER — NIFEDIPINE ER OSMOTIC RELEASE 30 MG PO TB24
30.0000 mg | ORAL_TABLET | Freq: Every day | ORAL | 3 refills | Status: DC
  Filled 2021-11-18: qty 30, 30d supply, fill #0
  Filled 2021-12-15: qty 30, 30d supply, fill #1
  Filled 2022-03-05: qty 30, 30d supply, fill #2
  Filled 2022-04-04: qty 30, 30d supply, fill #3

## 2021-11-21 NOTE — Progress Notes (Deleted)
Office Visit Note  Patient: [name]             Date of Birth: 1960-06-19           MRN: [id number]             PCP: Mosie Lukes, MD Referring: Mosie Lukes, MD Visit Date: 11/29/2021   Subjective:  No chief complaint on file.   History of Present Illness: [name] is a 61 y.o. female here for follow up for raynaud's symptoms on nifedipine 30 mg daily and tadalafil 5 mg daily.   Previous HPI 06/27/2021 [name] is a 61 y.o. female here for follow up for raynaud's symptoms on nifedipine 30 mg daily and starting tadalafil 5 mg daily after last visit. She has not seen a significant improvement in symptoms since starting the medication. She also does not report any side effects. She notices some increased joint pain and cysts overlying her finger joints on both hands. She reports intermittent episodes of gross blood in urine. Workup for this without clear cause established so far. She had recent labs checked showing mild CK elevation. She had CK elevation previously including in 2016 rheum evaluation with normal inflammatory markers and negative ANA.   Previous HPI 03/30/21 [name] is a 61 y.o. female here for follow up with raynaud's symptoms and abnormal labs initial visit demonstrating increased serum IgM of 433 ALT of 43 and ESR 38. We also recommended switching back to a single calcium channel blocker due to peripheral edema increased on combination of amlodipine and nifedipine.  Now she is just taking the Procardia 30 mg daily and had resolution of the lower extremity edema.  Hand discoloration pain and tightness continues intermittently as before.   Previous HPI 03/16/21 [name] is a 61 y.o. female here for evaluation of raynaud's symptoms and abnormal labs. She has had cold hands and feet longstanding but the development of discoloration in her fingers is newer in particular since her bariatric surgery earlier this year. She experiences  triphasic color changes with cold exposure most often although sometimes without clear provocation. She denies any digital pitting, peeling, or ulceration change. She takes precautions for cold wearing gloves and wearing clothes sleeping at night to She was previously on amlodipine 5 mg PO daily as well as metoprolol for hypertension. This was recommended switching to nifedipine 30 mg PO daily after vascular surgery evaluation with findings consistent for raynaud's. She restarted the amlodipine again in combination with this since yesterday and reports noticing ankle swelling this morning. She takes modafinil 400 mg about 4 days per week for help with work concentration unchanged for about 10 years.    Labs reviewed 02/2021 ANCA ATYP P-ANCA 1:160     No Rheumatology ROS completed.   PMFS History:  Patient Active Problem List   Diagnosis Date Noted   Tinea corporis 11/07/2021   Kidney stone 11/07/2021   Hematuria 08/05/2021   Pelvic pain 08/05/2021   Vitamin B12 deficiency 05/09/2021   Diverticula of intestine 03/16/2021   Sciatica 03/16/2021   Abnormal ANCA test 03/16/2021   High risk medication use 03/16/2021   Familial hypercholesterolemia 12/31/2020   Fibromyalgia 12/20/2020   Raynaud's phenomenon 12/07/2020   Gastric bypass status for obesity 08/11/2020   Chronic idiopathic constipation 07/15/2020   Mild CAD 05/13/2020   Anal pain 04/12/2020   Dysphonia 04/12/2020   Epigastric pain 04/12/2020   Family history of malignant neoplasm of gastrointestinal  tract 04/12/2020   Left lower quadrant pain 04/12/2020   Thoracic and lumbosacral neuritis 04/12/2020   Vocal fold nodules    Thyroid disease    Hypertension    GERD (gastroesophageal reflux disease)    Fibroid, uterine    Depressive disorder    Chronic rhinosinusitis    Anxiety    Hemorrhoids 02/22/2020   Statin intolerance 12/28/2019   Elevated CK 11/12/2019   Lymphadenopathy 11/12/2019   Neck pain 11/04/2019    Cervical radiculopathy 05/19/2019   Numbness and tingling 04/17/2019   Tachycardia 81/82/9937   Umbilical hernia 16/96/7893   Sun-damaged skin 01/19/2019   Preventative health care 01/19/2019   Right arm pain 01/16/2019   Carpal tunnel syndrome of right wrist 09/04/2018   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/26/2018   Bilateral hand pain 01/30/2018   Mucoid cyst, joint 01/30/2018   Nodule of finger of both hands 01/10/2018   Weakness 07/03/2017   Osteoarthritis of spine with radiculopathy, cervical region 07/24/2016   Dysuria 05/25/2016   Arthritis 05/25/2016   Obesity (BMI 30.0-34.9) 02/18/2016   Left shoulder pain 06/27/2015   Pain in joint, shoulder region 06/27/2015   Myalgia 03/27/2015   Muscle cramp 05/22/2014   Mass of soft tissue of neck 02/23/2014   AP (abdominal pain) 02/15/2014   Hoarseness of voice 05/11/2013   Backache 03/03/2013   Low back pain 03/03/2013   Hypokalemia 02/05/2013   Edema 01/06/2013   Diabetes mellitus type 2 in obese ([place]) 01/06/2013   Chronic pain syndrome 01/06/2013   Cough 01/06/2013   Perimenopause 10/05/2012   Glaucoma    SOB (shortness of breath) 09/10/2011   Constipation 02/13/2011   Vitamin D deficiency 02/13/2011   IBS (irritable bowel syndrome) 02/13/2011   RHINITIS, CHRONIC 01/14/2010   Atypical chest pain 06/16/2008   THYROMEGALY 11/11/2007   Allergic rhinitis 11/11/2007   ECZEMA, HANDS 07/22/2007   Hypothyroidism 06/06/2007   Hyperlipemia, mixed 06/06/2007   ADJ DISORDER WITH MIXED ANXIETY & DEPRESSED MOOD 06/06/2007   OSA (obstructive sleep apnea) 06/06/2007   COLONIC POLYPS, HX OF 06/06/2007    Past Medical History:  Diagnosis Date   Acute bronchitis 05/25/2016   Anxiety    Arthritis    Chronic pain syndrome 01/06/2013   Chronic rhinosinusitis    Colon polyps    Cough 01/06/2013   Depression    Diabetes ([place]) 01/06/2013   Diabetes mellitus type 2 in obese [facility]) 01/06/2013   Sees Dr Calvert Cantor for eye exam  Does not see podiatry, foot exam today unremarkable except for thick cracking skin on heals  pt denies    Dyspnea    with exertion    Dysuria 05/25/2016   Edema 01/06/2013   Epistaxis 05/25/2016   Fibroid, uterine    Fibromyalgia    GERD (gastroesophageal reflux disease)    Glaucoma 03/2012   Hoarseness    Hoarseness of voice 05/11/2013   Hyperglycemia 04/13/2013   Hyperlipemia, mixed 06/06/2007   Qualifier: Diagnosis of  By: Wynona Luna She feels Lipitor caused increased low back pain and weakness     Hyperlipidemia    Hypertension    Hypokalemia 02/05/2013   IBS (irritable bowel syndrome) 02/13/2011   Low back pain 03/03/2013   Muscle cramp 05/22/2014   Obesity    Obesity, unspecified 05/11/2013   OSA (obstructive sleep apnea)    cpap    Pain in joint, shoulder region 06/27/2015   Perimenopause 10/05/2012   Pre-diabetes    Raynaud  disease 06/16/2013   Sleep apnea    Thyroid disease    hypothyroidism   Vocal fold nodules     Family History  Problem Relation Age of Onset   Alcohol abuse Father    Cancer Father        renal and colon   Hyperlipidemia Father    Hypertension Father    Stroke Father    Heart disease Father    Cancer Paternal Grandfather        colon   Diabetes Other    High blood pressure Mother    High Cholesterol Mother    Kidney disease Mother    Depression Mother    Anxiety disorder Mother    Obesity Mother    Hypertension Mother    Breast cancer Other 30   Diabetes Sister    Diabetes Brother    Arthritis Maternal Grandmother    Heart disease Paternal Grandmother    Past Surgical History:  Procedure Laterality Date   ABDOMINAL HYSTERECTOMY     BREAST EXCISIONAL BIOPSY Right    at age 81   BREAST SURGERY  lump remove breast   age 55 yrs   CHOLECYSTECTOMY     colonoscopoy      CYSTECTOMY     DILATION AND CURETTAGE OF UTERUS     x2   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   Procedure: LAPAROSCOPIC ROUX-EN-Y GASTRIC BYPASS WITH  UPPER ENDOSCOPY;  Surgeon: [name], MD;  Location: [facility];  Service: General;  Laterality: N/A;   HERNIA REPAIR  5/22   I & D EXTREMITY Left 04/11/2018   Procedure: DEBIDMENT DISTAL INTERPHALANGEAL LEFT MIDDLE;  Surgeon: Daryll Brod, MD;  Location: [facility];  Service: Orthopedics;  Laterality: Left;   INCISIONAL HERNIA REPAIR  08/10/2020   Procedure: LAPAROSCOPIC PRIMARY REPAIR OF INCISIONAL HERNIA;  Surgeon: [name], MD;  Location: [facility];  Service: General;;   MASS EXCISION Left 04/11/2018   Procedure: EXCISION MASS;  Surgeon: Daryll Brod, MD;  Location: [facility];  Service: Orthopedics;  Laterality: Left;   OOPHORECTOMY     POLYPECTOMY     ROTATOR CUFF REPAIR     UPPER GI ENDOSCOPY N/A 08/10/2020   Procedure: UPPER GI ENDOSCOPY;  Surgeon: [name], MD;  Location: [facility];  Service: General;  Laterality: N/A;   Social History   Social History Narrative   Endo-- Dr Dwyane Dee   ENT--Dr shoemaker   GI--Dr Buccini   Pulm--Dr Clance   Rheum--Dr Charlestine Night         Immunization History  Administered Date(s) Administered   Influenza Split 01/10/2021   Influenza Whole 01/23/2008, 01/28/2009, 02/16/2010   Influenza, High Dose Seasonal PF 01/06/2013, 01/10/2016, 01/10/2018, 01/16/2019, 01/02/2020   Influenza,inj,Quad PF,6+ Mos 01/06/2013, 01/10/2016, 01/10/2018, 01/16/2019, 01/02/2020   Influenza-Unspecified 01/22/2014, 02/09/2015, 01/17/2017   PFIZER Comirnaty(Gray Top)Covid-19 Tri-Sucrose Vaccine 05/05/2019, 05/26/2019   PFIZER(Purple Top)SARS-COV-2 Vaccination 05/05/2019, 05/26/2019   Pneumococcal Conjugate-13 05/22/2014   Pneumococcal Polysaccharide-23 03/22/2015   Td 12/18/2000   Tdap 12/18/2000, 02/10/2014   Zoster Recombinat (Shingrix) 01/10/2018, 05/21/2018     Objective: Vital Signs: There were no vitals taken for this visit.   Physical Exam   Musculoskeletal Exam: ***  CDAI Exam: CDAI Score: -- Patient Global: --; Provider  Global: -- Swollen: --; Tender: -- Joint Exam 11/29/2021   No joint exam has been documented for this visit   There is currently no information documented on the homunculus. Go to the Rheumatology activity and complete the  homunculus joint exam.  Investigation: No additional findings.  Imaging: DG Cervical Spine Complete  Result Date: 11/08/2021 CLINICAL DATA:  Left lateral neck pain for 1 week. EXAM: CERVICAL SPINE - COMPLETE 4+ VIEW COMPARISON:  None Available. FINDINGS: There is no evidence of cervical spine fracture or prevertebral soft tissue swelling. Alignment is normal. Mild-to-moderate degenerative joint changes of cervical spine with anterior osteophytosis is identified throughout cervical spine. There is narrow intervertebral space at C7-T1. IMPRESSION: Degenerative joint changes of cervical spine. Electronically Signed   By: Abelardo Diesel M.D.   On: 11/08/2021 10:51    Recent Labs: Lab Results  Component Value Date   WBC 3.5 (L) 11/07/2021   HGB 12.3 11/07/2021   PLT 247.0 11/07/2021   NA 141 11/07/2021   K 4.1 11/07/2021   CL 104 11/07/2021   CO2 28 11/07/2021   GLUCOSE 71 11/07/2021   BUN 8 11/07/2021   CREATININE 0.61 11/07/2021   BILITOT 0.2 11/07/2021   ALKPHOS 87 11/07/2021   AST 51 (H) 11/07/2021   ALT 76 (H) 11/07/2021   PROT 6.0 11/07/2021   ALBUMIN 3.9 11/07/2021   CALCIUM 9.0 11/07/2021   GFRAA 79 04/27/2020    Speciality Comments: No specialty comments available.  Procedures:  No procedures performed Allergies: Bupropion, Crestor [rosuvastatin], Losartan, Atorvastatin, Simvastatin, Amoxicillin, Aspirin, Codeine, Erythromycin, and Penicillins   Assessment / Plan:     Visit Diagnoses: No diagnosis found.  ***  Orders: No orders of the defined types were placed in this encounter.  No orders of the defined types were placed in this encounter.    Follow-Up Instructions: No follow-ups on file.   Earnestine Mealing, CMA  Note - This record  has been created using Editor, commissioning.  Chart creation errors have been sought, but may not always  have been located. Such creation errors do not reflect on  the standard of medical care.

## 2021-11-22 ENCOUNTER — Other Ambulatory Visit (HOSPITAL_COMMUNITY): Payer: Self-pay

## 2021-11-22 ENCOUNTER — Other Ambulatory Visit: Payer: Self-pay | Admitting: Family Medicine

## 2021-11-22 ENCOUNTER — Other Ambulatory Visit: Payer: Self-pay | Admitting: Gastroenterology

## 2021-11-22 DIAGNOSIS — K5900 Constipation, unspecified: Secondary | ICD-10-CM | POA: Diagnosis not present

## 2021-11-22 MED ORDER — MOTEGRITY 2 MG PO TABS
2.0000 mg | ORAL_TABLET | Freq: Every day | ORAL | 0 refills | Status: DC
  Filled 2021-11-22 – 2021-11-25 (×3): qty 30, 30d supply, fill #0

## 2021-11-22 MED ORDER — METOPROLOL TARTRATE 25 MG PO TABS
12.5000 mg | ORAL_TABLET | Freq: Two times a day (BID) | ORAL | 1 refills | Status: DC
  Filled 2021-11-22: qty 60, 60d supply, fill #0
  Filled 2022-02-01: qty 60, 60d supply, fill #1

## 2021-11-23 DIAGNOSIS — N201 Calculus of ureter: Secondary | ICD-10-CM | POA: Diagnosis not present

## 2021-11-25 ENCOUNTER — Other Ambulatory Visit (HOSPITAL_COMMUNITY): Payer: Self-pay

## 2021-11-28 ENCOUNTER — Other Ambulatory Visit (HOSPITAL_COMMUNITY): Payer: Self-pay

## 2021-11-29 ENCOUNTER — Ambulatory Visit: Payer: 59 | Admitting: Internal Medicine

## 2021-11-29 DIAGNOSIS — R2233 Localized swelling, mass and lump, upper limb, bilateral: Secondary | ICD-10-CM

## 2021-11-29 DIAGNOSIS — I7300 Raynaud's syndrome without gangrene: Secondary | ICD-10-CM

## 2021-11-29 DIAGNOSIS — M797 Fibromyalgia: Secondary | ICD-10-CM

## 2021-12-01 ENCOUNTER — Other Ambulatory Visit (HOSPITAL_COMMUNITY): Payer: Self-pay

## 2021-12-06 DIAGNOSIS — E049 Nontoxic goiter, unspecified: Secondary | ICD-10-CM | POA: Diagnosis not present

## 2021-12-06 DIAGNOSIS — E042 Nontoxic multinodular goiter: Secondary | ICD-10-CM | POA: Diagnosis not present

## 2021-12-06 DIAGNOSIS — E039 Hypothyroidism, unspecified: Secondary | ICD-10-CM | POA: Diagnosis not present

## 2021-12-07 ENCOUNTER — Encounter: Payer: Self-pay | Admitting: Obstetrics and Gynecology

## 2021-12-09 ENCOUNTER — Encounter: Payer: Self-pay | Admitting: Obstetrics and Gynecology

## 2021-12-09 ENCOUNTER — Other Ambulatory Visit (HOSPITAL_COMMUNITY): Payer: Self-pay

## 2021-12-09 ENCOUNTER — Ambulatory Visit: Payer: 59 | Admitting: Obstetrics and Gynecology

## 2021-12-09 VITALS — BP 121/76 | HR 97 | Ht 63.5 in | Wt 177.0 lb

## 2021-12-09 DIAGNOSIS — M62838 Other muscle spasm: Secondary | ICD-10-CM | POA: Diagnosis not present

## 2021-12-09 DIAGNOSIS — R350 Frequency of micturition: Secondary | ICD-10-CM | POA: Diagnosis not present

## 2021-12-09 LAB — POCT URINALYSIS DIPSTICK
Bilirubin, UA: NEGATIVE
Blood, UA: NEGATIVE
Glucose, UA: NEGATIVE
Ketones, UA: NEGATIVE
Leukocytes, UA: NEGATIVE
Nitrite, UA: NEGATIVE
Protein, UA: NEGATIVE
Spec Grav, UA: 1.015 (ref 1.010–1.025)
Urobilinogen, UA: 0.2 E.U./dL
pH, UA: 7 (ref 5.0–8.0)

## 2021-12-09 MED ORDER — DIAZEPAM 5 MG PO TABS
ORAL_TABLET | ORAL | 1 refills | Status: DC
  Filled 2021-12-09: qty 30, 30d supply, fill #0

## 2021-12-09 NOTE — Patient Instructions (Signed)
I will prescribe valium 5 mg pills to place vaginally up to 2 times a day for vaginal muscle spasms. Start at night and take one every night for the next several weeks to see if it improves your symptoms. Once you are improving you can taper off the medication and just use as needed. If the medication makes you drowsy then only use at bedtime and/or we can reduce the dose. Do not use gel to place it, as that will prevent the tablet from dissolving.  Instead place 1-2 drops of water on the table before inserting it in the vagina. If the pills do not dissolve well we can switch to a special compounded suppository. Let me know how you are doing on the medication and if you have any questions.

## 2021-12-09 NOTE — Progress Notes (Signed)
[facility] New Patient Evaluation and Consultation  Referring Provider: Mosie Lukes, MD PCP: Mosie Lukes, MD Date of Service: 12/09/2021  SUBJECTIVE Chief Complaint: New Patient (Initial Visit)  History of Present Illness: [name] is a 62 y.o. Black or African-American female seen in consultation at the request of Dr. Charlett Blake for evaluation of pelvic pain.    Review of records significant for: Complains of pelvic pain that alternates with her constipation. Has also had procedure for kidney stone in June.   Urinary Symptoms: Does not leak urine. - In the past has had some occasional stress incontinence.   Day time voids: every few hours.  Nocturia: 1 times per night to void. Voiding dysfunction: she does not empty her bladder well. Sometimes feels the muscles spasm and the urine stream stops.  does not use a catheter to empty bladder.   UTIs:  0  UTI's in the last year.   Reports history of kidney or bladder stones- recently treated in June   Pelvic Organ Prolapse Symptoms:                  She Denies a feeling of a bulge the vaginal area, but feels a pressure at the opening. It has been present for years.  She Denies seeing a bulge.   Bowel Symptom: Bowel movements: has constipation Stool consistency: hard Straining: yes.  Splinting: no.  Incomplete evacuation: yes.  She Denies accidental bowel leakage / fecal incontinence Bowel regimen: miralax  Currently being seen by GI.  Has anorectal manometry scheduled in November.   Sexual Function Sexually active: no.  Sexual orientation:  heterosexual Partner had prostate cancer.   Pelvic Pain Admits to pelvic pain Always has discomfort in her pelvis since her hysterectomy. Has a dull ache, especially on her left side, feels it pulling.   Has some pelvic discomfort but also feels this relates to her chronic constipation. Has also seen physical therapy and did aquatic therapy, and she saw some  improvement with the constipation.   Past Medical History:  Past Medical History:  Diagnosis Date   Acute bronchitis 05/25/2016   Anxiety    Arthritis    Chronic pain syndrome 01/06/2013   Chronic rhinosinusitis    Colon polyps    Cough 01/06/2013   Depression    Diabetes ([place]) 01/06/2013   Diabetes mellitus type 2 in obese [facility]) 01/06/2013   Sees Dr Calvert Cantor for eye exam Does not see podiatry, foot exam today unremarkable except for thick cracking skin on heals  pt denies    Dyspnea    with exertion    Dysuria 05/25/2016   Edema 01/06/2013   Epistaxis 05/25/2016   Fibroid, uterine    Fibromyalgia    GERD (gastroesophageal reflux disease)    Glaucoma 03/2012   Hoarseness    Hoarseness of voice 05/11/2013   Hyperglycemia 04/13/2013   Hyperlipemia, mixed 06/06/2007   Qualifier: Diagnosis of  By: Wynona Luna She feels Lipitor caused increased low back pain and weakness     Hyperlipidemia    Hypertension    Hypokalemia 02/05/2013   IBS (irritable bowel syndrome) 02/13/2011   Low back pain 03/03/2013   Muscle cramp 05/22/2014   Obesity    Obesity, unspecified 05/11/2013   OSA (obstructive sleep apnea)    cpap    Pain in joint, shoulder region 06/27/2015   Perimenopause 10/05/2012   Pre-diabetes    Raynaud disease 06/16/2013   Sleep apnea  Thyroid disease    hypothyroidism   Vocal fold nodules      Past Surgical History:   Past Surgical History:  Procedure Laterality Date   ABDOMINAL HYSTERECTOMY     BREAST EXCISIONAL BIOPSY Right    at age 36   BREAST SURGERY  lump remove breast   age 26 yrs   CHOLECYSTECTOMY     colonoscopoy      CYSTECTOMY     DILATION AND CURETTAGE OF UTERUS     x2   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   Procedure: LAPAROSCOPIC ROUX-EN-Y GASTRIC BYPASS WITH UPPER ENDOSCOPY;  Surgeon: [name], MD;  Location: [facility];  Service: General;  Laterality: N/A;   HERNIA REPAIR  5/22   I & D EXTREMITY Left 04/11/2018   Procedure:  DEBIDMENT DISTAL INTERPHALANGEAL LEFT MIDDLE;  Surgeon: Daryll Brod, MD;  Location: [place];  Service: Orthopedics;  Laterality: Left;   INCISIONAL HERNIA REPAIR  08/10/2020   Procedure: LAPAROSCOPIC PRIMARY REPAIR OF INCISIONAL HERNIA;  Surgeon: [name], MD;  Location: [facility];  Service: General;;   MASS EXCISION Left 04/11/2018   Procedure: EXCISION MASS;  Surgeon: Daryll Brod, MD;  Location: [place];  Service: Orthopedics;  Laterality: Left;   OOPHORECTOMY     POLYPECTOMY     ROTATOR CUFF REPAIR     UPPER GI ENDOSCOPY N/A 08/10/2020   Procedure: UPPER GI ENDOSCOPY;  Surgeon: [name], MD;  Location: [facility];  Service: General;  Laterality: N/A;     Past OB/GYN History: OB History  Gravida Para Term Preterm AB Living  0 0 0 0 0 0  SAB IAB Ectopic Multiple Live Births  0 0 0 0 0    S/p hysterectomy for abnormal bleeding/ fibroids   Medications: She has a current medication list which includes the following prescription(s): acarbose, albuterol, azelastine, bimatoprost, buspirone, calcium carbonate, calcium carbonate, cholecalciferol, diazepam, econazole nitrate, repatha sureclick, fluticasone, furosemide, gabapentin, ipratropium, ipratropium, loratadine, lubiprostone, lubiprostone, metoprolol tartrate, savella, modafinil, multivitamin with minerals, nifedipine, pantoprazole, motegrity, spironolactone, thyroid, and vitamin d (ergocalciferol).   Allergies: Patient is allergic to bupropion, crestor [rosuvastatin], losartan, atorvastatin, simvastatin, amoxicillin, aspirin, codeine, erythromycin, and penicillins.   Social History:  Social History   Tobacco Use   Smoking status: Former    Packs/day: 0.50    Years: 30.00    Total pack years: 15.00    Types: Cigarettes    Quit date: 04/25/2003    Years since quitting: 18.6   Smokeless tobacco: Never   Tobacco comments:    smoked since age 74  Vaping Use   Vaping Use: Never used  Substance  Use Topics   Alcohol use: No   Drug use: Never    Relationship status: long-term partner She lives with partner.   She is employed- Scientist, forensic. Regular exercise: Yes: HOPE program at [employer] 3x a week History of abuse: No  Family History:   Family History  Problem Relation Age of Onset   Alcohol abuse Father    Cancer Father        renal and colon   Hyperlipidemia Father    Hypertension Father    Stroke Father    Heart disease Father    Cancer Paternal Grandfather        colon   Diabetes Other    High blood pressure Mother    High Cholesterol Mother    Kidney disease Mother    Depression Mother    Anxiety disorder  Mother    Obesity Mother    Hypertension Mother    Breast cancer Other 73   Diabetes Sister    Diabetes Brother    Arthritis Maternal Grandmother    Heart disease Paternal Grandmother      Review of Systems: Review of Systems  Constitutional:  Positive for malaise/fatigue.  Cardiovascular:  Positive for leg swelling.  Gastrointestinal:  Positive for abdominal pain.  Neurological:  Positive for dizziness and headaches.  Endo/Heme/Allergies:  Bruises/bleeds easily.     OBJECTIVE Physical Exam: Vitals:   12/09/21 1517  BP: 121/76  Pulse: 97  Weight: 177 lb (80.3 kg)  Height: 5' 3.5" (1.613 m)    Physical Exam Constitutional:      General: She is not in acute distress. Pulmonary:     Effort: Pulmonary effort is normal.  Abdominal:     General: There is no distension.     Palpations: Abdomen is soft.     Tenderness: There is no abdominal tenderness. There is no rebound.     Comments: Pfannenstiel incision  Musculoskeletal:        General: No swelling. Normal range of motion.  Skin:    General: Skin is warm and dry.     Findings: No rash.  Neurological:     Mental Status: She is alert and oriented to person, place, and time.  Psychiatric:        Mood and Affect: Mood normal.        Behavior: Behavior normal.      GU / Detailed  Urogynecologic Evaluation:  Pelvic Exam: Normal external female genitalia; Bartholin's and Skene's glands normal in appearance; urethral meatus normal in appearance, no urethral masses or discharge.   CST: negative  Q-tip- tenderness present at introitus.   s/p hysterectomy: Speculum exam reveals normal vaginal mucosa with  atrophy and normal vaginal cuff.  Adnexa no mass, fullness, tenderness.    Pelvic floor strength II/V, puborectalis IV/V external anal sphincter IV/V  Pelvic floor musculature: Right levator non-tender, Right obturator non-tender, Left levator tender, Left obturator tender  POP-Q:  - deferred, no prolapse present   Rectal Exam:  Normal sphincter tone, no distal rectocele, enterocoele not present, no rectal masses  Post-Void Residual (PVR) by Bladder Scan: In order to evaluate bladder emptying, we discussed obtaining a postvoid residual and she agreed to this procedure.  Procedure: The ultrasound unit was placed on the patient's abdomen in the suprapubic region after the patient had voided. A PVR of 5 ml was obtained by bladder scan.  Laboratory Results: none   ASSESSMENT AND PLAN Ms. Kobayashi is a 61 y.o. with:  1. Levator spasm    - Pelvic floor muscles palpated on the left side reproduces her discomfort.  - The origin of pelvic floor muscle spasm can be multifactorial, including primary, reactive to a different pain source, trauma, or even part of a centralized pain syndrome.Treatment options include pelvic floor physical therapy, local (vaginal) or oral  muscle relaxants, pelvic muscle trigger point injections or centrally acting pain medications.   - Prescribed vaginal valium '5mg'$  to take nightly for two weeks then as needed after.  - Referral also placed to pelvic PT- prior PT concentrated on constipation issues.   Return 1 month   Jaquita Folds, MD

## 2021-12-13 ENCOUNTER — Other Ambulatory Visit: Payer: Self-pay

## 2021-12-13 ENCOUNTER — Encounter: Payer: Self-pay | Admitting: Physical Therapy

## 2021-12-13 ENCOUNTER — Encounter: Payer: 59 | Attending: Obstetrics and Gynecology | Admitting: Physical Therapy

## 2021-12-13 DIAGNOSIS — M6281 Muscle weakness (generalized): Secondary | ICD-10-CM | POA: Insufficient documentation

## 2021-12-13 DIAGNOSIS — R252 Cramp and spasm: Secondary | ICD-10-CM | POA: Insufficient documentation

## 2021-12-13 DIAGNOSIS — R279 Unspecified lack of coordination: Secondary | ICD-10-CM | POA: Diagnosis not present

## 2021-12-13 DIAGNOSIS — R102 Pelvic and perineal pain: Secondary | ICD-10-CM | POA: Insufficient documentation

## 2021-12-13 NOTE — Therapy (Signed)
OUTPATIENT PHYSICAL THERAPY FEMALE PELVIC EVALUATION   Patient Name: [name] MRN: [id number] DOB:07-09-1960, 61 y.o., female Today's Date: 12/13/2021   PT End of Session - 12/13/21 1357     Visit Number 1    Date for PT Re-Evaluation 03/07/22    Authorization Type UMR    Authorization - Number of Visits 1    Progress Note Due on Visit 25    PT Start Time 1300    PT Stop Time 1345    PT Time Calculation (min) 45 min    Activity Tolerance Patient tolerated treatment well    Behavior During Therapy [facility] for tasks assessed/performed             Past Medical History:  Diagnosis Date   Acute bronchitis 05/25/2016   Anxiety    Arthritis    Chronic pain syndrome 01/06/2013   Chronic rhinosinusitis    Colon polyps    Cough 01/06/2013   Depression    Diabetes ([place]) 01/06/2013   Diabetes mellitus type 2 in obese ([place]) 01/06/2013   Sees Dr Calvert Cantor for eye exam Does not see podiatry, foot exam today unremarkable except for thick cracking skin on heals  pt denies    Dyspnea    with exertion    Dysuria 05/25/2016   Edema 01/06/2013   Epistaxis 05/25/2016   Fibroid, uterine    Fibromyalgia    GERD (gastroesophageal reflux disease)    Glaucoma 03/2012   Hoarseness    Hoarseness of voice 05/11/2013   Hyperglycemia 04/13/2013   Hyperlipemia, mixed 06/06/2007   Qualifier: Diagnosis of  By: Wynona Luna She feels Lipitor caused increased low back pain and weakness     Hyperlipidemia    Hypertension    Hypokalemia 02/05/2013   IBS (irritable bowel syndrome) 02/13/2011   Low back pain 03/03/2013   Muscle cramp 05/22/2014   Obesity    Obesity, unspecified 05/11/2013   OSA (obstructive sleep apnea)    cpap    Pain in joint, shoulder region 06/27/2015   Perimenopause 10/05/2012   Pre-diabetes    Raynaud disease 06/16/2013   Sleep apnea    Thyroid disease    hypothyroidism   Vocal fold nodules    Past Surgical History:  Procedure Laterality Date    ABDOMINAL HYSTERECTOMY     BREAST EXCISIONAL BIOPSY Right    at age 52   BREAST SURGERY  lump remove breast   age 57 yrs   CHOLECYSTECTOMY     colonoscopoy      CYSTECTOMY     DILATION AND CURETTAGE OF UTERUS     x2   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   Procedure: LAPAROSCOPIC ROUX-EN-Y GASTRIC BYPASS WITH UPPER ENDOSCOPY;  Surgeon: [name], MD;  [facility];  Service: General;  Laterality: N/A;   HERNIA REPAIR  5/22   I & D EXTREMITY Left 04/11/2018   Procedure: DEBIDMENT DISTAL INTERPHALANGEAL LEFT MIDDLE;  Surgeon: Daryll Brod, MD;  Location: [place];  Service: Orthopedics;  Laterality: Left;   INCISIONAL HERNIA REPAIR  08/10/2020   Procedure: LAPAROSCOPIC PRIMARY REPAIR OF INCISIONAL HERNIA;  Surgeon: [name], MD;  Location: [facility];  Service: General;;   MASS EXCISION Left 04/11/2018   Procedure: EXCISION MASS;  Surgeon: Daryll Brod, MD;  Location: [place];  Service: Orthopedics;  Laterality: Left;   OOPHORECTOMY     POLYPECTOMY     ROTATOR CUFF REPAIR     UPPER GI ENDOSCOPY  N/A 08/10/2020   Procedure: UPPER GI ENDOSCOPY;  Surgeon: [name], MD;  Location[facility];  Service: General;  Laterality: N/A;   Patient Active Problem List   Diagnosis Date Noted   Tinea corporis 11/07/2021   Kidney stone 11/07/2021   Hematuria 08/05/2021   Pelvic pain 08/05/2021   Vitamin B12 deficiency 05/09/2021   Diverticula of intestine 03/16/2021   Sciatica 03/16/2021   Abnormal ANCA test 03/16/2021   High risk medication use 03/16/2021   Familial hypercholesterolemia 12/31/2020   Fibromyalgia 12/20/2020   Raynaud's phenomenon 12/07/2020   Gastric bypass status for obesity 08/11/2020   Chronic idiopathic constipation 07/15/2020   Mild CAD 05/13/2020   Anal pain 04/12/2020   Dysphonia 04/12/2020   Epigastric pain 04/12/2020   Family history of malignant neoplasm of gastrointestinal tract 04/12/2020   Left lower quadrant pain 04/12/2020    Thoracic and lumbosacral neuritis 04/12/2020   Vocal fold nodules    Thyroid disease    Hypertension    GERD (gastroesophageal reflux disease)    Fibroid, uterine    Depressive disorder    Chronic rhinosinusitis    Anxiety    Hemorrhoids 02/22/2020   Statin intolerance 12/28/2019   Elevated CK 11/12/2019   Lymphadenopathy 11/12/2019   Neck pain 11/04/2019   Cervical radiculopathy 05/19/2019   Numbness and tingling 04/17/2019   Tachycardia 35/57/3220   Umbilical hernia 25/42/7062   Sun-damaged skin 01/19/2019   Preventative health care 01/19/2019   Right arm pain 01/16/2019   Carpal tunnel syndrome of right wrist 09/04/2018   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/26/2018   Bilateral hand pain 01/30/2018   Mucoid cyst, joint 01/30/2018   Nodule of finger of both hands 01/10/2018   Weakness 07/03/2017   Osteoarthritis of spine with radiculopathy, cervical region 07/24/2016   Dysuria 05/25/2016   Arthritis 05/25/2016   Obesity (BMI 30.0-34.9) 02/18/2016   Left shoulder pain 06/27/2015   Pain in joint, shoulder region 06/27/2015   Myalgia 03/27/2015   Muscle cramp 05/22/2014   Mass of soft tissue of neck 02/23/2014   AP (abdominal pain) 02/15/2014   Hoarseness of voice 05/11/2013   Backache 03/03/2013   Low back pain 03/03/2013   Hypokalemia 02/05/2013   Edema 01/06/2013   Diabetes mellitus type 2 in obese ([place]) 01/06/2013   Chronic pain syndrome 01/06/2013   Cough 01/06/2013   Perimenopause 10/05/2012   Glaucoma    SOB (shortness of breath) 09/10/2011   Constipation 02/13/2011   Vitamin D deficiency 02/13/2011   IBS (irritable bowel syndrome) 02/13/2011   RHINITIS, CHRONIC 01/14/2010   Atypical chest pain 06/16/2008   THYROMEGALY 11/11/2007   Allergic rhinitis 11/11/2007   ECZEMA, HANDS 07/22/2007   Hypothyroidism 06/06/2007   Hyperlipemia, mixed 06/06/2007   ADJ DISORDER WITH MIXED ANXIETY & DEPRESSED MOOD 06/06/2007   OSA (obstructive sleep apnea)  06/06/2007   COLONIC POLYPS, HX OF 06/06/2007    PCP: Mosie Lukes, MD  REFERRING PROVIDER: Jaquita Folds, MD  REFERRING DIAG: [id number] (ICD-10-CM) - Levator spasm  THERAPY DIAG:  Cramp and spasm  Unspecified lack of coordination  Muscle weakness (generalized)  Pelvic pain  Rationale for Evaluation and Treatment Rehabilitation  ONSET DATE: 2015  SUBJECTIVE:  SUBJECTIVE STATEMENT: Patient reports she is still having constipation issues. Patient had kidney stones. I have had pelvic pain for years.  Fluid intake: Yes: does not drink a lot of water     PAIN:  Are you having pain? Yes NPRS scale: 7/10 Pain location:  pelvic especially the left side  Pain type: aching Pain description: intermittent   Aggravating factors: when constipated, comes on randomly Relieving factors: heat pad  PRECAUTIONS: None  WEIGHT BEARING RESTRICTIONS No  FALLS:  Has patient fallen in last 6 months? No  LIVING ENVIRONMENT: Lives with: lives with their partner  OCCUPATION: watches computer monitors  PLOF: Independent  PATIENT GOALS manage pain, understand how to improve pelvic health and bowel movements  PERTINENT HISTORY:  Abdominal hysterectomy; Gastric Roux-en-y 2022; Hernia repair; chronic pain syndrome; Diabetes; fibromyalgia; glaucoma; OSA; Hypothyroidism; IBS  BOWEL MOVEMENT Pain with bowel movement: Yes, when constipated and straining Type of bowel movement:Type (Bristol Stool Scale) Type 1,2,3,4,5,6,7, Frequency every 3 days and sometimes uses an enema , Strain Yes, and Splinting no Fully empty rectum: No Leakage: No Fiber supplement: Yes: Miralax; currently being seen by GI and waiting for anorectal manometry in November  URINATION Pain with urination: No, pressure Fully  empty bladder: No Stream: Weak Urgency: No Frequency: average Leakage:  none Pads: No   PROLAPSE None    OBJECTIVE:   DIAGNOSTIC FINDINGS:  PVR of 5 ml was obtained by bladder scan.  COGNITION:  Overall cognitive status: Within functional limits for tasks assessed , she trouble remembering things. She reports she has brought this to the doctors attention.     SENSATION:  Light touch: Appears intact  Proprioception: Appears intact                 POSTURE: No Significant postural limitations   PELVIC ALIGNMENT:  LUMBARAROM/PROM  A/PROM A/PROM  eval  Flexion full  Extension full  Right lateral flexion Decreased by 25%  Left lateral flexion Decreased by 25%  Right rotation Decreased by 25%  Left rotation Decreased by 25%   (Blank rows = not tested)  LOWER EXTREMITY TOI:[id number] hip ROM isfull   LOWER EXTREMITY MMT:  MMT Right eval Left eval  Hip extension 4/5 4/5  Hip abduction 4/5 4/5    PALPATION:   General  tenderness located throughout the abdomen, increased rib cage angle, lower abdominals contract minimally, tenderness located on the hip adductor origin                External Perineal Exam tenderness located in the perineal body, around the rectum and anterior to coccyx bone                             Internal Pelvic Floor tenderness in the introitus, bil. Levator ani, obturator internist, along the sphincters and anococcygeal ligament  Patient confirms identification and approves PT to assess internal pelvic floor and treatment Yes  PELVIC MMT:   MMT eval  Vaginal 2/5 ant. Post. But 1/5 on the sides and for 1 sec  Internal Anal Sphincter 1/5  External Anal Sphincter 1/5  Puborectalis 2/5  (Blank rows = not tested)        TONE: Increased tone vaginally and rectally  PROLAPSE: none  TODAY'S TREATMENT  EVAL completed the evaluation     HOME EXERCISE PROGRAM: None today  ASSESSMENT:  CLINICAL IMPRESSION: Patient is a 61 y.o.  female who was seen today for physical therapy  evaluation and treatment for levator spasm. Patient reports she has had chronic pelvic pain and constipation for years. Pelvic pain is intermittent at level 7/10 with left worse than right. She reports her pain comes on randomly and when she is constipated. She will have a bowel movement every 3 days and sometimes will use an enema to assist with the bowel movement. She has to strain with a bowel movement and she has Type 1-6 bowel movement. Patient is waiting for an anorectal manometry in November. Patient was not able to push the therapist finger out of the rectum. Pelvic floor strength was 1/5 for internal and external shpincter. Puborectalis was 2/5. Vaginal strength is 2/5 ant. Post. But 1/5 on the sides and for 1 sec. She has tenderness located  in the introitus, bil. Levator ani, obturator internist, along the sphincters and anococcygeal ligament. She has limited lumbar ROM. Patient has weakness in bilateral hip extension and abduction. Patient will benefit from skilled therapy to improve pelvic floor coordination so she is able to have a bowel movement with greater ease and reduce her pain.    OBJECTIVE IMPAIRMENTS decreased activity tolerance, decreased endurance, decreased strength, increased fascial restrictions, increased muscle spasms, impaired tone, and pain.   ACTIVITY LIMITATIONS carrying, lifting, bending, sitting, standing, squatting, and toileting  PARTICIPATION LIMITATIONS: meal prep, cleaning, laundry, and community activity  PERSONAL FACTORS Age, Time since onset of injury/illness/exacerbation, and 3+ comorbidities:    Abdominal hysterectomy; Gastric Roux-en-y 2022; Hernia repair; chronic pain syndrome; Diabetes; fibromyalgia; glaucoma; OSA; Hypothyroidism; IBS are also affecting patient's functional outcome.   REHAB POTENTIAL: Good  CLINICAL DECISION MAKING: Evolving/moderate complexity  EVALUATION COMPLEXITY:  Moderate   GOALS: Goals reviewed with patient? Yes  SHORT TERM GOALS: Target date: 01/10/2022  Patient independent with initial HEP to relax the pelvic floor with mediation, hip stretches and diaphragmatic breathing.  Baseline: Goal status: INITIAL  2.  Patient is able to bulge her pelvic floor with correct breathing.  Baseline:  Goal status: INITIAL  3.  Patient understands how to massage her pelvic floor to relax the  muscles.  Baseline:  Goal status: INITIAL  4.    Baseline:  Goal status: INITIAL   LONG TERM GOALS: Target date: 03/07/2022   Patient independent with advanced HEP for pelvic floor relaxation and core strengthening.  Baseline:  Goal status: INITIAL  2.  Patient is able to push her stool out with >/= 50% greater ease.  Baseline:  Goal status: INITIAL  3.  Patient reports her pain is </= 3/10 50%of the day.  Baseline:  Goal status: INITIAL  4.  Patient understands ways to manage her pain with different behavioral techniques.  Baseline:  Goal status: INITIAL  5.  Pelvic floor strength >/= 3/5 with circular contraction and able to fully relax. Baseline:  Goal status: INITIAL  6.  Patient is able to push the therapist finger out of the rectum due to improved pelvic floor coordination.  Baseline:  Goal status: INITIAL  PLAN: PT FREQUENCY: 1x/week  PT DURATION: 12 weeks  PLANNED INTERVENTIONS: Therapeutic exercises, Therapeutic activity, Neuromuscular re-education, Patient/Family education, Self Care, Joint mobilization, Dry Needling, Electrical stimulation, Spinal mobilization, Cryotherapy, Moist heat, Taping, Biofeedback, and Manual therapy  PLAN FOR NEXT SESSION: diaphragmatic breathing, meditation, hip stretches, abdominal work, manual work on [employer], PT 12/13/21 9:10 PM

## 2021-12-13 NOTE — Addendum Note (Signed)
Addended by: Elita Quick on: 12/13/2021 09:02 AM   Modules accepted: Orders

## 2021-12-14 DIAGNOSIS — K912 Postsurgical malabsorption, not elsewhere classified: Secondary | ICD-10-CM | POA: Diagnosis not present

## 2021-12-14 DIAGNOSIS — R200 Anesthesia of skin: Secondary | ICD-10-CM | POA: Diagnosis not present

## 2021-12-15 ENCOUNTER — Other Ambulatory Visit: Payer: Self-pay | Admitting: Family Medicine

## 2021-12-16 ENCOUNTER — Other Ambulatory Visit (HOSPITAL_COMMUNITY): Payer: Self-pay

## 2021-12-16 MED ORDER — PANTOPRAZOLE SODIUM 40 MG PO TBEC
40.0000 mg | DELAYED_RELEASE_TABLET | Freq: Every day | ORAL | 0 refills | Status: DC
  Filled 2021-12-16: qty 90, 90d supply, fill #0

## 2021-12-16 MED ORDER — THYROID 90 MG PO TABS
90.0000 mg | ORAL_TABLET | Freq: Every day | ORAL | 1 refills | Status: DC
  Filled 2021-12-16: qty 90, 90d supply, fill #0
  Filled 2022-03-15: qty 90, 90d supply, fill #1

## 2021-12-20 ENCOUNTER — Encounter: Payer: 59 | Admitting: Physical Therapy

## 2021-12-20 ENCOUNTER — Encounter: Payer: Self-pay | Admitting: Physical Therapy

## 2021-12-20 DIAGNOSIS — R102 Pelvic and perineal pain: Secondary | ICD-10-CM

## 2021-12-20 DIAGNOSIS — M6281 Muscle weakness (generalized): Secondary | ICD-10-CM

## 2021-12-20 DIAGNOSIS — R279 Unspecified lack of coordination: Secondary | ICD-10-CM

## 2021-12-20 DIAGNOSIS — R252 Cramp and spasm: Secondary | ICD-10-CM | POA: Diagnosis not present

## 2021-12-20 NOTE — Therapy (Signed)
OUTPATIENT PHYSICAL THERAPY TREATMENT NOTE   Patient Name: [name] MRN: [id number] DOB:1961-01-19, 61 y.o., female Today's Date: 12/20/2021  PCP: Mosie Lukes, MD REFERRING PROVIDER: Jaquita Folds, MD  END OF SESSION:   PT End of Session - 12/20/21 1302     Visit Number 2    Date for PT Re-Evaluation 03/07/22    Authorization Type UMR    Authorization - Number of Visits 2    Progress Note Due on Visit 25    PT Start Time 1300    PT Stop Time 1345    PT Time Calculation (min) 45 min    Activity Tolerance Patient tolerated treatment well    Behavior During Therapy [facility] for tasks assessed/performed             Past Medical History:  Diagnosis Date   Acute bronchitis 05/25/2016   Anxiety    Arthritis    Chronic pain syndrome 01/06/2013   Chronic rhinosinusitis    Colon polyps    Cough 01/06/2013   Depression    Diabetes ([place]) 01/06/2013   Diabetes mellitus type 2 in obese ([place]) 01/06/2013   Sees Dr Calvert Cantor for eye exam Does not see podiatry, foot exam today unremarkable except for thick cracking skin on heals  pt denies    Dyspnea    with exertion    Dysuria 05/25/2016   Edema 01/06/2013   Epistaxis 05/25/2016   Fibroid, uterine    Fibromyalgia    GERD (gastroesophageal reflux disease)    Glaucoma 03/2012   Hoarseness    Hoarseness of voice 05/11/2013   Hyperglycemia 04/13/2013   Hyperlipemia, mixed 06/06/2007   Qualifier: Diagnosis of  By: Wynona Luna She feels Lipitor caused increased low back pain and weakness     Hyperlipidemia    Hypertension    Hypokalemia 02/05/2013   IBS (irritable bowel syndrome) 02/13/2011   Low back pain 03/03/2013   Muscle cramp 05/22/2014   Obesity    Obesity, unspecified 05/11/2013   OSA (obstructive sleep apnea)    cpap    Pain in joint, shoulder region 06/27/2015   Perimenopause 10/05/2012   Pre-diabetes    Raynaud disease 06/16/2013   Sleep apnea    Thyroid disease    hypothyroidism    Vocal fold nodules    Past Surgical History:  Procedure Laterality Date   ABDOMINAL HYSTERECTOMY     BREAST EXCISIONAL BIOPSY Right    at age 12   BREAST SURGERY  lump remove breast   age 89 yrs   CHOLECYSTECTOMY     colonoscopoy      CYSTECTOMY     DILATION AND CURETTAGE OF UTERUS     x2   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   Procedure: LAPAROSCOPIC ROUX-EN-Y GASTRIC BYPASS WITH UPPER ENDOSCOPY;  Surgeon: [name], MD;  Location: [facility];  Service: General;  Laterality: N/A;   HERNIA REPAIR  5/22   I & D EXTREMITY Left 04/11/2018   Procedure: DEBIDMENT DISTAL INTERPHALANGEAL LEFT MIDDLE;  Surgeon: Daryll Brod, MD;  Location: [facility];  Service: Orthopedics;  Laterality: Left;   INCISIONAL HERNIA REPAIR  08/10/2020   Procedure: LAPAROSCOPIC PRIMARY REPAIR OF INCISIONAL HERNIA;  Surgeon: [name], MD;  Location: [facility];  Service: General;;   MASS EXCISION Left 04/11/2018   Procedure: EXCISION MASS;  Surgeon: Daryll Brod, MD;  Location: [facility];  Service: Orthopedics;  Laterality: Left;   OOPHORECTOMY  POLYPECTOMY     ROTATOR CUFF REPAIR     UPPER GI ENDOSCOPY N/A 08/10/2020   Procedure: UPPER GI ENDOSCOPY;  Surgeon: [name], MD;  Location: [facility];  Service: General;  Laterality: N/A;   Patient Active Problem List   Diagnosis Date Noted   Tinea corporis 11/07/2021   Kidney stone 11/07/2021   Hematuria 08/05/2021   Pelvic pain 08/05/2021   Vitamin B12 deficiency 05/09/2021   Diverticula of intestine 03/16/2021   Sciatica 03/16/2021   Abnormal ANCA test 03/16/2021   High risk medication use 03/16/2021   Familial hypercholesterolemia 12/31/2020   Fibromyalgia 12/20/2020   Raynaud's phenomenon 12/07/2020   Gastric bypass status for obesity 08/11/2020   Chronic idiopathic constipation 07/15/2020   Mild CAD 05/13/2020   Anal pain 04/12/2020   Dysphonia 04/12/2020   Epigastric pain 04/12/2020   Family history of malignant  neoplasm of gastrointestinal tract 04/12/2020   Left lower quadrant pain 04/12/2020   Thoracic and lumbosacral neuritis 04/12/2020   Vocal fold nodules    Thyroid disease    Hypertension    GERD (gastroesophageal reflux disease)    Fibroid, uterine    Depressive disorder    Chronic rhinosinusitis    Anxiety    Hemorrhoids 02/22/2020   Statin intolerance 12/28/2019   Elevated CK 11/12/2019   Lymphadenopathy 11/12/2019   Neck pain 11/04/2019   Cervical radiculopathy 05/19/2019   Numbness and tingling 04/17/2019   Tachycardia 56/25/6389   Umbilical hernia 37/34/2876   Sun-damaged skin 01/19/2019   Preventative health care 01/19/2019   Right arm pain 01/16/2019   Carpal tunnel syndrome of right wrist 09/04/2018   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/26/2018   Bilateral hand pain 01/30/2018   Mucoid cyst, joint 01/30/2018   Nodule of finger of both hands 01/10/2018   Weakness 07/03/2017   Osteoarthritis of spine with radiculopathy, cervical region 07/24/2016   Dysuria 05/25/2016   Arthritis 05/25/2016   Obesity (BMI 30.0-34.9) 02/18/2016   Left shoulder pain 06/27/2015   Pain in joint, shoulder region 06/27/2015   Myalgia 03/27/2015   Muscle cramp 05/22/2014   Mass of soft tissue of neck 02/23/2014   AP (abdominal pain) 02/15/2014   Hoarseness of voice 05/11/2013   Backache 03/03/2013   Low back pain 03/03/2013   Hypokalemia 02/05/2013   Edema 01/06/2013   Diabetes mellitus type 2 in obese ([place]) 01/06/2013   Chronic pain syndrome 01/06/2013   Cough 01/06/2013   Perimenopause 10/05/2012   Glaucoma    SOB (shortness of breath) 09/10/2011   Constipation 02/13/2011   Vitamin D deficiency 02/13/2011   IBS (irritable bowel syndrome) 02/13/2011   RHINITIS, CHRONIC 01/14/2010   Atypical chest pain 06/16/2008   THYROMEGALY 11/11/2007   Allergic rhinitis 11/11/2007   ECZEMA, HANDS 07/22/2007   Hypothyroidism 06/06/2007   Hyperlipemia, mixed 06/06/2007   ADJ DISORDER  WITH MIXED ANXIETY & DEPRESSED MOOD 06/06/2007   OSA (obstructive sleep apnea) 06/06/2007   COLONIC POLYPS, HX OF 06/06/2007   REFERRING DIAG: O11.572 (ICD-10-CM) - Levator spasm   THERAPY DIAG:  Cramp and spasm   Unspecified lack of coordination   Muscle weakness (generalized)   Pelvic pain   Rationale for Evaluation and Treatment Rehabilitation   ONSET DATE: 2015   SUBJECTIVE:  SUBJECTIVE STATEMENT: I have been sleepy using the valium. The valium helped.  Fluid intake: Yes: does not drink a lot of water       PAIN:  Are you having pain? Yes NPRS scale: 7/10 Pain location:  pelvic especially the left side   Pain type: aching Pain description: intermittent    Aggravating factors: when constipated, comes on randomly Relieving factors: heat pad   PRECAUTIONS: None   WEIGHT BEARING RESTRICTIONS No   FALLS:  Has patient fallen in last 6 months? No   LIVING ENVIRONMENT: Lives with: lives with their partner   OCCUPATION: watches computer monitors   PLOF: Independent   PATIENT GOALS manage pain, understand how to improve pelvic health and bowel movements   PERTINENT HISTORY:  Abdominal hysterectomy; Gastric Roux-en-y 2022; Hernia repair; chronic pain syndrome; Diabetes; fibromyalgia; glaucoma; OSA; Hypothyroidism; IBS   BOWEL MOVEMENT Pain with bowel movement: Yes, when constipated and straining Type of bowel movement:Type (Bristol Stool Scale) Type 1,2,3,4,5,6,7, Frequency every 3 days and sometimes uses an enema , Strain Yes, and Splinting no Fully empty rectum: No Leakage: No Fiber supplement: Yes: Miralax; currently being seen by GI and waiting for anorectal manometry in November   URINATION Pain with urination: No, pressure Fully empty bladder: No Stream: Weak Urgency:  No Frequency: average Leakage:  none Pads: No     PROLAPSE None       OBJECTIVE:    DIAGNOSTIC FINDINGS:  PVR of 5 ml was obtained by bladder scan.   COGNITION:            Overall cognitive status: Within functional limits for tasks assessed , she trouble remembering things. She reports she has brought this to the doctors attention.                 SENSATION:            Light touch: Appears intact            Proprioception: Appears intact                    POSTURE: No Significant postural limitations               PELVIC ALIGNMENT:   LUMBARAROM/PROM   A/PROM A/PROM  eval  Flexion full  Extension full  Right lateral flexion Decreased by 25%  Left lateral flexion Decreased by 25%  Right rotation Decreased by 25%  Left rotation Decreased by 25%   (Blank rows = not tested)   LOWER EXTREMITY ZOX:[id number] hip ROM isfull     LOWER EXTREMITY MMT:   MMT Right eval Left eval  Hip extension 4/5 4/5  Hip abduction 4/5 4/5     PALPATION:   General  tenderness located throughout the abdomen, increased rib cage angle, lower abdominals contract minimally, tenderness located on the hip adductor origin                 External Perineal Exam tenderness located in the perineal body, around the rectum and anterior to coccyx bone                             Internal Pelvic Floor tenderness in the introitus, bil. Levator ani, obturator internist, along the sphincters and anococcygeal ligament   Patient confirms identification and approves PT to assess internal pelvic floor and treatment Yes   PELVIC MMT:   MMT eval  Vaginal 2/5 ant. Post. But  1/5 on the sides and for 1 sec  Internal Anal Sphincter 1/5  External Anal Sphincter 1/5  Puborectalis 2/5  (Blank rows = not tested)         TONE: Increased tone vaginally and rectally   PROLAPSE: none   TODAY'S TREATMENT  12/20/2021 Manual: Soft tissue mobilization:manual work to the diaphragm Scar tissue  mobilization: to the scar suprapubically  Myofascial release:release suprapubically lifting the tissue up off the bladder, release fo the mesenteric root, release of the left inguinal area, tissue rolling of the upper abdominal release around the umbilicus Neuromuscular re-education: Down training:diaphragmatic breathing with therapist assisting the downward motion of the lower rib cage Exercises: Stretches/mobility:sitting piriformis stretch holding for 30 sec bil.  Sitting hamstring stretch holding 30 sec bil.  Happy baby sitting holding 30 sec Sitting hip adductor stretch holding for 30 sec bil.  Sitting hip flexor stretch Self-care: Educated patient on mediation and sent her links to the you tube videos    PATIENT EDUCATION: 12/20/2021 Education details: Access Code: [email or web address] Person educated: Patient Education method: Explanation, Demonstration, Tactile cues, Verbal cues, and Handouts Education comprehension: verbalized understanding, returned demonstration, verbal cues required, tactile cues required, and needs further education         HOME EXERCISE PROGRAM: 12/20/2021 Access Code: [email or web address] URL: [email or web address] Date: 12/20/2021 Prepared by: Earlie Counts  Exercises  - Seated Piriformis Stretch with Trunk Bend  - 1 x daily - 7 x weekly - 1 sets - 2 reps - 30 sec hold - Seated Hamstring Stretch  - 1 x daily - 7 x weekly - 1 sets - 2 reps - 30 sec hold - Seated Happy Baby With Trunk Flexion For Pelvic Relaxation  - 1 x daily - 7 x weekly - 1 sets - 1 reps - 30 sec hold - Seated Hip Adductor Stretch  - 1 x daily - 7 x weekly - 1 sets - 2 reps - 30 sec hold - Seated Hip Flexor Stretch  - 1 x daily - 7 x weekly - 1 sets - 2 reps - 30 sec hold - Supine Diaphragmatic Breathing  - 2 x daily - 7 x weekly - 1 sets - 10 reps   ASSESSMENT:   CLINICAL IMPRESSION: Patient is a 61 y.o. female who was seen today for physical therapy  treatment for levator spasm.   Patient was able to open up the lower rib cage with diaphragmatic breathing. She does not engage the abdomen to bring the rib cage downward. She has tightness suprapubically. She has learned stretches to do in sitting at work. Patient will benefit from skilled therapy to improve pelvic floor coordination so she is able to have a bowel movement with greater ease and reduce her pain.      OBJECTIVE IMPAIRMENTS decreased activity tolerance, decreased endurance, decreased strength, increased fascial restrictions, increased muscle spasms, impaired tone, and pain.    ACTIVITY LIMITATIONS carrying, lifting, bending, sitting, standing, squatting, and toileting   PARTICIPATION LIMITATIONS: meal prep, cleaning, laundry, and community activity   PERSONAL FACTORS Age, Time since onset of injury/illness/exacerbation, and 3+ comorbidities:    Abdominal hysterectomy; Gastric Roux-en-y 2022; Hernia repair; chronic pain syndrome; Diabetes; fibromyalgia; glaucoma; OSA; Hypothyroidism; IBS are also affecting patient's functional outcome.    REHAB POTENTIAL: Good   CLINICAL DECISION MAKING: Evolving/moderate complexity   EVALUATION COMPLEXITY: Moderate     GOALS: Goals reviewed with patient? Yes   SHORT TERM GOALS: Target date: 01/10/2022   Patient independent with initial HEP  to relax the pelvic floor with mediation, hip stretches and diaphragmatic breathing.  Baseline: Goal status: INITIAL   2.  Patient is able to bulge her pelvic floor with correct breathing.  Baseline:  Goal status: INITIAL   3.  Patient understands how to massage her pelvic floor to relax the  muscles.  Baseline:  Goal status: INITIAL   4.    Baseline:  Goal status: INITIAL     LONG TERM GOALS: Target date: 03/07/2022    Patient independent with advanced HEP for pelvic floor relaxation and core strengthening.  Baseline:  Goal status: INITIAL   2.  Patient is able to push her stool out with >/= 50% greater ease.   Baseline:  Goal status: INITIAL   3.  Patient reports her pain is </= 3/10 50%of the day.  Baseline:  Goal status: INITIAL   4.  Patient understands ways to manage her pain with different behavioral techniques.  Baseline:  Goal status: INITIAL   5.  Pelvic floor strength >/= 3/5 with circular contraction and able to fully relax. Baseline:  Goal status: INITIAL   6.  Patient is able to push the therapist finger out of the rectum due to improved pelvic floor coordination.  Baseline:  Goal status: INITIAL   PLAN: PT FREQUENCY: 1x/week   PT DURATION: 12 weeks   PLANNED INTERVENTIONS: Therapeutic exercises, Therapeutic activity, Neuromuscular re-education, Patient/Family education, Self Care, Joint mobilization, Dry Needling, Electrical stimulation, Spinal mobilization, Cryotherapy, Moist heat, Taping, Biofeedback, and Manual therapy   PLAN FOR NEXT SESSION: diaphragmatic breathing,  abdominal work, manual work on thighs, bulging of the pelvic floor, contraction of the abdomen  Earlie Counts, PT 12/20/21 1:50 PM

## 2021-12-21 ENCOUNTER — Encounter: Payer: Self-pay | Admitting: Family Medicine

## 2021-12-21 ENCOUNTER — Other Ambulatory Visit: Payer: Self-pay | Admitting: Family Medicine

## 2021-12-21 ENCOUNTER — Other Ambulatory Visit (HOSPITAL_COMMUNITY): Payer: Self-pay

## 2021-12-21 MED ORDER — ECONAZOLE NITRATE 1 % EX CREA
TOPICAL_CREAM | Freq: Every day | CUTANEOUS | 1 refills | Status: DC
Start: 2021-12-21 — End: 2021-12-21
  Filled 2021-12-21: qty 30, 14d supply, fill #0

## 2021-12-21 MED ORDER — ITRACONAZOLE 100 MG PO CAPS
100.0000 mg | ORAL_CAPSULE | Freq: Every day | ORAL | 0 refills | Status: DC
  Filled 2021-12-21: qty 14, 14d supply, fill #0

## 2021-12-22 ENCOUNTER — Encounter: Payer: Self-pay | Admitting: Pulmonary Disease

## 2021-12-22 ENCOUNTER — Other Ambulatory Visit (HOSPITAL_COMMUNITY): Payer: Self-pay

## 2021-12-23 ENCOUNTER — Encounter: Payer: Self-pay | Admitting: Obstetrics and Gynecology

## 2021-12-23 ENCOUNTER — Other Ambulatory Visit (HOSPITAL_COMMUNITY): Payer: Self-pay

## 2021-12-27 ENCOUNTER — Other Ambulatory Visit (HOSPITAL_COMMUNITY): Payer: Self-pay

## 2021-12-27 ENCOUNTER — Other Ambulatory Visit (HOSPITAL_COMMUNITY): Payer: Self-pay | Admitting: Psychiatry

## 2021-12-27 ENCOUNTER — Encounter: Payer: 59 | Attending: Obstetrics and Gynecology | Admitting: Physical Therapy

## 2021-12-27 ENCOUNTER — Encounter: Payer: Self-pay | Admitting: Physical Therapy

## 2021-12-27 DIAGNOSIS — M6281 Muscle weakness (generalized): Secondary | ICD-10-CM | POA: Diagnosis not present

## 2021-12-27 DIAGNOSIS — R102 Pelvic and perineal pain: Secondary | ICD-10-CM | POA: Diagnosis not present

## 2021-12-27 DIAGNOSIS — R252 Cramp and spasm: Secondary | ICD-10-CM

## 2021-12-27 DIAGNOSIS — M62838 Other muscle spasm: Secondary | ICD-10-CM | POA: Diagnosis not present

## 2021-12-27 DIAGNOSIS — R279 Unspecified lack of coordination: Secondary | ICD-10-CM | POA: Insufficient documentation

## 2021-12-27 NOTE — Therapy (Signed)
OUTPATIENT PHYSICAL THERAPY TREATMENT NOTE   Patient Name: [name] MRN: [id number] DOB:05-May-1960, 61 y.o., female Today's Date: 12/27/2021  PCP: Mosie Lukes, MD REFERRING PROVIDER: Jaquita Folds, MD  END OF SESSION:   PT End of Session - 12/27/21 1131     Visit Number 3    Date for PT Re-Evaluation 03/07/22    Authorization Type U[employer]    Authorization - Number of Visits 3    Progress Note Due on Visit 25    PT Start Time 1130    PT Stop Time 1215    PT Time Calculation (min) 45 min    Activity Tolerance Patient tolerated treatment well    Behavior During Therapy [facility] for tasks assessed/performed             Past Medical History:  Diagnosis Date   Acute bronchitis 05/25/2016   Anxiety    Arthritis    Chronic pain syndrome 01/06/2013   Chronic rhinosinusitis    Colon polyps    Cough 01/06/2013   Depression    Diabetes ([place]) 01/06/2013   Diabetes mellitus type 2 in obese ([place]) 01/06/2013   Sees Dr Calvert Cantor for eye exam Does not see podiatry, foot exam today unremarkable except for thick cracking skin on heals  pt denies    Dyspnea    with exertion    Dysuria 05/25/2016   Edema 01/06/2013   Epistaxis 05/25/2016   Fibroid, uterine    Fibromyalgia    GERD (gastroesophageal reflux disease)    Glaucoma 03/2012   Hoarseness    Hoarseness of voice 05/11/2013   Hyperglycemia 04/13/2013   Hyperlipemia, mixed 06/06/2007   Qualifier: Diagnosis of  By: Wynona Luna She feels Lipitor caused increased low back pain and weakness     Hyperlipidemia    Hypertension    Hypokalemia 02/05/2013   IBS (irritable bowel syndrome) 02/13/2011   Low back pain 03/03/2013   Muscle cramp 05/22/2014   Obesity    Obesity, unspecified 05/11/2013   OSA (obstructive sleep apnea)    cpap    Pain in joint, shoulder region 06/27/2015   Perimenopause 10/05/2012   Pre-diabetes    Raynaud disease 06/16/2013   Sleep apnea    Thyroid disease    hypothyroidism    Vocal fold nodules    Past Surgical History:  Procedure Laterality Date   ABDOMINAL HYSTERECTOMY     BREAST EXCISIONAL BIOPSY Right    at age 76   BREAST SURGERY  lump remove breast   age 71 yrs   CHOLECYSTECTOMY     colonoscopoy      CYSTECTOMY     DILATION AND CURETTAGE OF UTERUS     x2   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   Procedure: LAPAROSCOPIC ROUX-EN-Y GASTRIC BYPASS WITH UPPER ENDOSCOPY;  Surgeon: [name], MD;  Location: [facility];  Service: General;  Laterality: N/A;   HERNIA REPAIR  5/22   I & D EXTREMITY Left 04/11/2018   Procedure: DEBIDMENT DISTAL INTERPHALANGEAL LEFT MIDDLE;  Surgeon: Daryll Brod, MD;  Location: [place];  Service: Orthopedics;  Laterality: Left;   INCISIONAL HERNIA REPAIR  08/10/2020   Procedure: LAPAROSCOPIC PRIMARY REPAIR OF INCISIONAL HERNIA;  Surgeon: [name], MD;  Location: [facility];  Service: General;;   MASS EXCISION Left 04/11/2018   Procedure: EXCISION MASS;  Surgeon: Daryll Brod, MD;  Location: [place];  Service: Orthopedics;  Laterality: Left;   OOPHORECTOMY  POLYPECTOMY     ROTATOR CUFF REPAIR     UPPER GI ENDOSCOPY N/A 08/10/2020   Procedure: UPPER GI ENDOSCOPY;  Surgeon: [name], MD;  Location: [facility];  Service: General;  Laterality: N/A;   Patient Active Problem List   Diagnosis Date Noted   Tinea corporis 11/07/2021   Kidney stone 11/07/2021   Hematuria 08/05/2021   Pelvic pain 08/05/2021   Vitamin B12 deficiency 05/09/2021   Diverticula of intestine 03/16/2021   Sciatica 03/16/2021   Abnormal ANCA test 03/16/2021   High risk medication use 03/16/2021   Familial hypercholesterolemia 12/31/2020   Fibromyalgia 12/20/2020   Raynaud's phenomenon 12/07/2020   Gastric bypass status for obesity 08/11/2020   Chronic idiopathic constipation 07/15/2020   Mild CAD 05/13/2020   Anal pain 04/12/2020   Dysphonia 04/12/2020   Epigastric pain 04/12/2020   Family history of malignant  neoplasm of gastrointestinal tract 04/12/2020   Left lower quadrant pain 04/12/2020   Thoracic and lumbosacral neuritis 04/12/2020   Vocal fold nodules    Thyroid disease    Hypertension    GERD (gastroesophageal reflux disease)    Fibroid, uterine    Depressive disorder    Chronic rhinosinusitis    Anxiety    Hemorrhoids 02/22/2020   Statin intolerance 12/28/2019   Elevated CK 11/12/2019   Lymphadenopathy 11/12/2019   Neck pain 11/04/2019   Cervical radiculopathy 05/19/2019   Numbness and tingling 04/17/2019   Tachycardia 44/06/4740   Umbilical hernia 59/56/3875   Sun-damaged skin 01/19/2019   Preventative health care 01/19/2019   Right arm pain 01/16/2019   Carpal tunnel syndrome of right wrist 09/04/2018   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/26/2018   Bilateral hand pain 01/30/2018   Mucoid cyst, joint 01/30/2018   Nodule of finger of both hands 01/10/2018   Weakness 07/03/2017   Osteoarthritis of spine with radiculopathy, cervical region 07/24/2016   Dysuria 05/25/2016   Arthritis 05/25/2016   Obesity (BMI 30.0-34.9) 02/18/2016   Left shoulder pain 06/27/2015   Pain in joint, shoulder region 06/27/2015   Myalgia 03/27/2015   Muscle cramp 05/22/2014   Mass of soft tissue of neck 02/23/2014   AP (abdominal pain) 02/15/2014   Hoarseness of voice 05/11/2013   Backache 03/03/2013   Low back pain 03/03/2013   Hypokalemia 02/05/2013   Edema 01/06/2013   Diabetes mellitus type 2 in obese ([place]) 01/06/2013   Chronic pain syndrome 01/06/2013   Cough 01/06/2013   Perimenopause 10/05/2012   Glaucoma    SOB (shortness of breath) 09/10/2011   Constipation 02/13/2011   Vitamin D deficiency 02/13/2011   IBS (irritable bowel syndrome) 02/13/2011   RHINITIS, CHRONIC 01/14/2010   Atypical chest pain 06/16/2008   THYROMEGALY 11/11/2007   Allergic rhinitis 11/11/2007   ECZEMA, HANDS 07/22/2007   Hypothyroidism 06/06/2007   Hyperlipemia, mixed 06/06/2007   ADJ DISORDER  WITH MIXED ANXIETY & DEPRESSED MOOD 06/06/2007   OSA (obstructive sleep apnea) 06/06/2007   COLONIC POLYPS, HX OF 06/06/2007   REFERRING DIAG: I43.329 (ICD-10-CM) - Levator spasm   THERAPY DIAG:  Cramp and spasm   Unspecified lack of coordination   Muscle weakness (generalized)   Pelvic pain   Rationale for Evaluation and Treatment Rehabilitation   ONSET DATE: 2015   SUBJECTIVE:  SUBJECTIVE STATEMENT: I have not gotten worse. I tried the meditation and it did not help because I focused on pain in other areas.    PAIN:  Are you having pain? Yes NPRS scale: 6/10 Pain location:  pelvic especially the left side   Pain type: aching Pain description: intermittent    Aggravating factors: when constipated, comes on randomly Relieving factors: heat pad   PRECAUTIONS: None   WEIGHT BEARING RESTRICTIONS No   FALLS:  Has patient fallen in last 6 months? No   LIVING ENVIRONMENT: Lives with: lives with their partner   OCCUPATION: watches computer monitors   PLOF: Independent   PATIENT GOALS manage pain, understand how to improve pelvic health and bowel movements   PERTINENT HISTORY:  Abdominal hysterectomy; Gastric Roux-en-y 2022; Hernia repair; chronic pain syndrome; Diabetes; fibromyalgia; glaucoma; OSA; Hypothyroidism; IBS   BOWEL MOVEMENT Pain with bowel movement: Yes, when constipated and straining Type of bowel movement:Type (Bristol Stool Scale) Type 1,2,3,4,5,6,7, Frequency every 3 days and sometimes uses an enema , Strain Yes, and Splinting no Fully empty rectum: No Leakage: No Fiber supplement: Yes: Miralax; currently being seen by GI and waiting for anorectal manometry in November   URINATION Pain with urination: No, pressure Fully empty bladder: No Stream: Weak Urgency:  No Frequency: average Leakage:  none Pads: No     PROLAPSE None       OBJECTIVE:    DIAGNOSTIC FINDINGS:  PVR of 5 ml was obtained by bladder scan.   COGNITION:            Overall cognitive status: Within functional limits for tasks assessed , she trouble remembering things. She reports she has brought this to the doctors attention.                 SENSATION:            Light touch: Appears intact            Proprioception: Appears intact                    POSTURE: No Significant postural limitations               PELVIC ALIGNMENT:   LUMBARAROM/PROM   A/PROM A/PROM  eval  Flexion full  Extension full  Right lateral flexion Decreased by 25%  Left lateral flexion Decreased by 25%  Right rotation Decreased by 25%  Left rotation Decreased by 25%   (Blank rows = not tested)   LOWER EXTREMITY FOY:DXAJOINOM hip ROM isfull     LOWER EXTREMITY MMT:   MMT Right eval Left eval  Hip extension 4/5 4/5  Hip abduction 4/5 4/5     PALPATION:   General  tenderness located throughout the abdomen, increased rib cage angle, lower abdominals contract minimally, tenderness located on the hip adductor origin                 External Perineal Exam tenderness located in the perineal body, around the rectum and anterior to coccyx bone                             Internal Pelvic Floor tenderness in the introitus, bil. Levator ani, obturator internist, along the sphincters and anococcygeal ligament   Patient confirms identification and approves PT to assess internal pelvic floor and treatment Yes   PELVIC MMT:   MMT eval  Vaginal 2/5 ant. Post. But 1/5 on  the sides and for 1 sec  Internal Anal Sphincter 1/5  External Anal Sphincter 1/5  Puborectalis 2/5  (Blank rows = not tested)         TONE: Increased tone vaginally and rectally   PROLAPSE: none   TODAY'S TREATMENT  12/27/2021 Manual: Soft tissue mobilization:to bilateral hip adductors to relax the muscles Scar  tissue mobilization:scar massage to the lower abdomen going through the restrictions.  Myofascial release: tissue rolling of the abdomen; fascial release around the umbilicus and lower abdomen.  : Self-care: Educated patient on how to massage the perineum and buttocks to relax the  muscles.     12/20/2021 Manual: Soft tissue mobilization:manual work to the diaphragm Scar tissue mobilization: to the scar suprapubically  Myofascial release:release suprapubically lifting the tissue up off the bladder, release fo the mesenteric root, release of the left inguinal area, tissue rolling of the upper abdominal release around the umbilicus Neuromuscular re-education: Down training:diaphragmatic breathing with therapist assisting the downward motion of the lower rib cage Exercises: Stretches/mobility:sitting piriformis stretch holding for 30 sec bil.  Sitting hamstring stretch holding 30 sec bil.  Happy baby sitting holding 30 sec Sitting hip adductor stretch holding for 30 sec bil.  Sitting hip flexor stretch Self-care: Educated patient on mediation and sent her links to the you tube videos     PATIENT EDUCATION: 12/20/2021 Education details: Access Code: [email or web address], educated patient on perineal massage.  Person educated: Patient Education method: Explanation, Demonstration, Tactile cues, Verbal cues, and Handouts Education comprehension: verbalized understanding, returned demonstration, verbal cues required, tactile cues required, and needs further education         HOME EXERCISE PROGRAM: 12/20/2021 Access Code: [email or web address] URL: [email or web address] Date: 12/20/2021 Prepared by: Earlie Counts   Exercises   - Seated Piriformis Stretch with Trunk Bend  - 1 x daily - 7 x weekly - 1 sets - 2 reps - 30 sec hold - Seated Hamstring Stretch  - 1 x daily - 7 x weekly - 1 sets - 2 reps - 30 sec hold - Seated Happy Baby With Trunk Flexion For Pelvic Relaxation  - 1 x daily - 7 x weekly -  1 sets - 1 reps - 30 sec hold - Seated Hip Adductor Stretch  - 1 x daily - 7 x weekly - 1 sets - 2 reps - 30 sec hold - Seated Hip Flexor Stretch  - 1 x daily - 7 x weekly - 1 sets - 2 reps - 30 sec hold - Supine Diaphragmatic Breathing  - 2 x daily - 7 x weekly - 1 sets - 10 reps   ASSESSMENT:   CLINICAL IMPRESSION: Patient is a 61 y.o. female who was seen today for physical therapy  treatment for levator spasm.  Patient understands how to massage the perineum and along the hip adductors and gluteals to relax the pelvic floor. She has tightness throughout her abdomen and scar. Patient will benefit from skilled therapy to improve pelvic floor coordination so she is able to have a bowel movement with greater ease and reduce her pain.      OBJECTIVE IMPAIRMENTS decreased activity tolerance, decreased endurance, decreased strength, increased fascial restrictions, increased muscle spasms, impaired tone, and pain.    ACTIVITY LIMITATIONS carrying, lifting, bending, sitting, standing, squatting, and toileting   PARTICIPATION LIMITATIONS: meal prep, cleaning, laundry, and community activity   PERSONAL FACTORS Age, Time since onset of injury/illness/exacerbation, and 3+ comorbidities:    Abdominal hysterectomy; Gastric Roux-en-y 2022; Hernia repair; chronic  pain syndrome; Diabetes; fibromyalgia; glaucoma; OSA; Hypothyroidism; IBS are also affecting patient's functional outcome.    REHAB POTENTIAL: Good   CLINICAL DECISION MAKING: Evolving/moderate complexity   EVALUATION COMPLEXITY: Moderate     GOALS: Goals reviewed with patient? Yes   SHORT TERM GOALS: Target date: 01/10/2022   Patient independent with initial HEP to relax the pelvic floor with mediation, hip stretches and diaphragmatic breathing.  Baseline: Goal status: Met 12/27/2021   2.  Patient is able to bulge her pelvic floor with correct breathing.  Baseline:  Goal status: INITIAL   3.  Patient understands how to massage her  pelvic floor to relax the  muscles.  Baseline:  Goal status: INITIAL      LONG TERM GOALS: Target date: 03/07/2022    Patient independent with advanced HEP for pelvic floor relaxation and core strengthening.  Baseline:  Goal status: INITIAL   2.  Patient is able to push her stool out with >/= 50% greater ease.  Baseline:  Goal status: INITIAL   3.  Patient reports her pain is </= 3/10 50%of the day.  Baseline:  Goal status: INITIAL   4.  Patient understands ways to manage her pain with different behavioral techniques.  Baseline:  Goal status: INITIAL   5.  Pelvic floor strength >/= 3/5 with circular contraction and able to fully relax. Baseline:  Goal status: INITIAL   6.  Patient is able to push the therapist finger out of the rectum due to improved pelvic floor coordination.  Baseline:  Goal status: INITIAL   PLAN: PT FREQUENCY: 1x/week   PT DURATION: 12 weeks   PLANNED INTERVENTIONS: Therapeutic exercises, Therapeutic activity, Neuromuscular re-education, Patient/Family education, Self Care, Joint mobilization, Dry Needling, Electrical stimulation, Spinal mobilization, Cryotherapy, Moist heat, Taping, Biofeedback, and Manual therapy   PLAN FOR NEXT SESSION: try work around the introitus and levator abdominal work, manual work on thighs, bulging of the pelvic floor, contraction of the abdomen  Earlie Counts, PT 12/27/21 1:02 PM

## 2021-12-28 ENCOUNTER — Other Ambulatory Visit (HOSPITAL_COMMUNITY): Payer: Self-pay

## 2021-12-28 MED ORDER — BUSPIRONE HCL 7.5 MG PO TABS
7.5000 mg | ORAL_TABLET | Freq: Two times a day (BID) | ORAL | 1 refills | Status: DC
  Filled 2021-12-28: qty 60, 30d supply, fill #0
  Filled 2022-02-01: qty 60, 30d supply, fill #1

## 2021-12-28 NOTE — Progress Notes (Deleted)
Office Visit Note  Patient: [name]             Date of Birth: 1960-12-21           MRN: [id number]             PCP: Mosie Lukes, MD Referring: Mosie Lukes, MD Visit Date: 01/03/2022   Subjective:  No chief complaint on file.   History of Present Illness: [name] is a 61 y.o. female here for follow up raynaud's symptoms on nifedipine 30 mg daily and starting tadalafil 5 mg daily after last visit  Previous HPI 06/27/2021 [name] is a 61 y.o. female here for follow up for raynaud's symptoms on nifedipine 30 mg daily and starting tadalafil 5 mg daily after last visit. She has not seen a significant improvement in symptoms since starting the medication. She also does not report any side effects. She notices some increased joint pain and cysts overlying her finger joints on both hands. She reports intermittent episodes of gross blood in urine. Workup for this without clear cause established so far. She had recent labs checked showing mild CK elevation. She had CK elevation previously including in 2016 rheum evaluation with normal inflammatory markers and negative ANA.   Previous HPI 03/30/21 [name] is a 61 y.o. female here for follow up with raynaud's symptoms and abnormal labs initial visit demonstrating increased serum IgM of 433 ALT of 43 and ESR 38. We also recommended switching back to a single calcium channel blocker due to peripheral edema increased on combination of amlodipine and nifedipine.  Now she is just taking the Procardia 30 mg daily and had resolution of the lower extremity edema.  Hand discoloration pain and tightness continues intermittently as before.   Previous HPI 03/16/21 [name] is a 61 y.o. female here for evaluation of raynaud's symptoms and abnormal labs. She has had cold hands and feet longstanding but the development of discoloration in her fingers is newer in particular since her bariatric surgery earlier this  year. She experiences triphasic color changes with cold exposure most often although sometimes without clear provocation. She denies any digital pitting, peeling, or ulceration change. She takes precautions for cold wearing gloves and wearing clothes sleeping at night to She was previously on amlodipine 5 mg PO daily as well as metoprolol for hypertension. This was recommended switching to nifedipine 30 mg PO daily after vascular surgery evaluation with findings consistent for raynaud's. She restarted the amlodipine again in combination with this since yesterday and reports noticing ankle swelling this morning. She takes modafinil 400 mg about 4 days per week for help with work concentration unchanged for about 10 years.    Labs reviewed 02/2021 ANCA ATYP P-ANCA 1:160   No Rheumatology ROS completed.   PMFS History:  Patient Active Problem List   Diagnosis Date Noted   Tinea corporis 11/07/2021   Kidney stone 11/07/2021   Hematuria 08/05/2021   Pelvic pain 08/05/2021   Vitamin B12 deficiency 05/09/2021   Diverticula of intestine 03/16/2021   Sciatica 03/16/2021   Abnormal ANCA test 03/16/2021   High risk medication use 03/16/2021   Familial hypercholesterolemia 12/31/2020   Fibromyalgia 12/20/2020   Raynaud's phenomenon 12/07/2020   Gastric bypass status for obesity 08/11/2020   Chronic idiopathic constipation 07/15/2020   Mild CAD 05/13/2020   Anal pain 04/12/2020   Dysphonia 04/12/2020   Epigastric pain 04/12/2020   Family history of malignant neoplasm of gastrointestinal  tract 04/12/2020   Left lower quadrant pain 04/12/2020   Thoracic and lumbosacral neuritis 04/12/2020   Vocal fold nodules    Thyroid disease    Hypertension    GERD (gastroesophageal reflux disease)    Fibroid, uterine    Depressive disorder    Chronic rhinosinusitis    Anxiety    Hemorrhoids 02/22/2020   Statin intolerance 12/28/2019   Elevated CK 11/12/2019   Lymphadenopathy 11/12/2019   Neck pain  11/04/2019   Cervical radiculopathy 05/19/2019   Numbness and tingling 04/17/2019   Tachycardia 19/16/6060   Umbilical hernia 04/59/9774   Sun-damaged skin 01/19/2019   Preventative health care 01/19/2019   Right arm pain 01/16/2019   Carpal tunnel syndrome of right wrist 09/04/2018   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/26/2018   Bilateral hand pain 01/30/2018   Mucoid cyst, joint 01/30/2018   Nodule of finger of both hands 01/10/2018   Weakness 07/03/2017   Osteoarthritis of spine with radiculopathy, cervical region 07/24/2016   Dysuria 05/25/2016   Arthritis 05/25/2016   Obesity (BMI 30.0-34.9) 02/18/2016   Left shoulder pain 06/27/2015   Pain in joint, shoulder region 06/27/2015   Myalgia 03/27/2015   Muscle cramp 05/22/2014   Mass of soft tissue of neck 02/23/2014   AP (abdominal pain) 02/15/2014   Hoarseness of voice 05/11/2013   Backache 03/03/2013   Low back pain 03/03/2013   Hypokalemia 02/05/2013   Edema 01/06/2013   Diabetes mellitus type 2 in obese ([place]) 01/06/2013   Chronic pain syndrome 01/06/2013   Cough 01/06/2013   Perimenopause 10/05/2012   Glaucoma    SOB (shortness of breath) 09/10/2011   Constipation 02/13/2011   Vitamin D deficiency 02/13/2011   IBS (irritable bowel syndrome) 02/13/2011   RHINITIS, CHRONIC 01/14/2010   Atypical chest pain 06/16/2008   THYROMEGALY 11/11/2007   Allergic rhinitis 11/11/2007   ECZEMA, HANDS 07/22/2007   Hypothyroidism 06/06/2007   Hyperlipemia, mixed 06/06/2007   ADJ DISORDER WITH MIXED ANXIETY & DEPRESSED MOOD 06/06/2007   OSA (obstructive sleep apnea) 06/06/2007   COLONIC POLYPS, HX OF 06/06/2007    Past Medical History:  Diagnosis Date   Acute bronchitis 05/25/2016   Anxiety    Arthritis    Chronic pain syndrome 01/06/2013   Chronic rhinosinusitis    Colon polyps    Cough 01/06/2013   Depression    Diabetes ([place]) 01/06/2013   Diabetes mellitus type 2 in obese [facility]) 01/06/2013   Sees Dr Calvert Cantor  for eye exam Does not see podiatry, foot exam today unremarkable except for thick cracking skin on heals  pt denies    Dyspnea    with exertion    Dysuria 05/25/2016   Edema 01/06/2013   Epistaxis 05/25/2016   Fibroid, uterine    Fibromyalgia    GERD (gastroesophageal reflux disease)    Glaucoma 03/2012   Hoarseness    Hoarseness of voice 05/11/2013   Hyperglycemia 04/13/2013   Hyperlipemia, mixed 06/06/2007   Qualifier: Diagnosis of  By: Wynona Luna She feels Lipitor caused increased low back pain and weakness     Hyperlipidemia    Hypertension    Hypokalemia 02/05/2013   IBS (irritable bowel syndrome) 02/13/2011   Low back pain 03/03/2013   Muscle cramp 05/22/2014   Obesity    Obesity, unspecified 05/11/2013   OSA (obstructive sleep apnea)    cpap    Pain in joint, shoulder region 06/27/2015   Perimenopause 10/05/2012   Pre-diabetes    Raynaud  disease 06/16/2013   Sleep apnea    Thyroid disease    hypothyroidism   Vocal fold nodules     Family History  Problem Relation Age of Onset   Alcohol abuse Father    Cancer Father        renal and colon   Hyperlipidemia Father    Hypertension Father    Stroke Father    Heart disease Father    Cancer Paternal Grandfather        colon   Diabetes Other    High blood pressure Mother    High Cholesterol Mother    Kidney disease Mother    Depression Mother    Anxiety disorder Mother    Obesity Mother    Hypertension Mother    Breast cancer Other 23   Diabetes Sister    Diabetes Brother    Arthritis Maternal Grandmother    Heart disease Paternal Grandmother    Past Surgical History:  Procedure Laterality Date   ABDOMINAL HYSTERECTOMY     BREAST EXCISIONAL BIOPSY Right    at age 63   BREAST SURGERY  lump remove breast   age 89 yrs   CHOLECYSTECTOMY     colonoscopoy      CYSTECTOMY     DILATION AND CURETTAGE OF UTERUS     x2   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   Procedure: LAPAROSCOPIC ROUX-EN-Y GASTRIC  BYPASS WITH UPPER ENDOSCOPY;  Surgeon: [name], MD;  Location: [facility];  Service: General;  Laterality: N/A;   HERNIA REPAIR  5/22   I & D EXTREMITY Left 04/11/2018   Procedure: DEBIDMENT DISTAL INTERPHALANGEAL LEFT MIDDLE;  Surgeon: Daryll Brod, MD;  Location: [place];  Service: Orthopedics;  Laterality: Left;   INCISIONAL HERNIA REPAIR  08/10/2020   Procedure: LAPAROSCOPIC PRIMARY REPAIR OF INCISIONAL HERNIA;  Surgeon: [name], MD;  Location: [facility];  Service: General;;   MASS EXCISION Left 04/11/2018   Procedure: EXCISION MASS;  Surgeon: Daryll Brod, MD;  Location: [place];  Service: Orthopedics;  Laterality: Left;   OOPHORECTOMY     POLYPECTOMY     ROTATOR CUFF REPAIR     UPPER GI ENDOSCOPY N/A 08/10/2020   Procedure: UPPER GI ENDOSCOPY;  Surgeon: [name], MD;  Location: [facility];  Service: General;  Laterality: N/A;   Social History   Social History Narrative   Endo-- Dr Dwyane Dee   ENT--Dr shoemaker   GI--Dr Buccini   Pulm--Dr Clance   Rheum--Dr Charlestine Night         Immunization History  Administered Date(s) Administered   Influenza Split 01/10/2021   Influenza Whole 01/23/2008, 01/28/2009, 02/16/2010   Influenza, High Dose Seasonal PF 01/06/2013, 01/10/2016, 01/10/2018, 01/16/2019, 01/02/2020   Influenza,inj,Quad PF,6+ Mos 01/06/2013, 01/10/2016, 01/10/2018, 01/16/2019, 01/02/2020   Influenza-Unspecified 01/22/2014, 02/09/2015, 01/17/2017   PFIZER Comirnaty(Gray Top)Covid-19 Tri-Sucrose Vaccine 05/05/2019, 05/26/2019   PFIZER(Purple Top)SARS-COV-2 Vaccination 05/05/2019, 05/26/2019   Pneumococcal Conjugate-13 05/22/2014   Pneumococcal Polysaccharide-23 03/22/2015   Td 12/18/2000   Tdap 12/18/2000, 02/10/2014   Zoster Recombinat (Shingrix) 01/10/2018, 05/21/2018     Objective: Vital Signs: There were no vitals taken for this visit.   Physical Exam   Musculoskeletal Exam: ***  CDAI Exam: CDAI Score: -- Patient Global: --;  Provider Global: -- Swollen: --; Tender: -- Joint Exam 01/03/2022   No joint exam has been documented for this visit   There is currently no information documented on the homunculus. Go to the Rheumatology activity and complete the  homunculus joint exam.  Investigation: No additional findings.  Imaging: No results found.  Recent Labs: Lab Results  Component Value Date   WBC 3.5 (L) 11/07/2021   HGB 12.3 11/07/2021   PLT 247.0 11/07/2021   NA 141 11/07/2021   K 4.1 11/07/2021   CL 104 11/07/2021   CO2 28 11/07/2021   GLUCOSE 71 11/07/2021   BUN 8 11/07/2021   CREATININE 0.61 11/07/2021   BILITOT 0.2 11/07/2021   ALKPHOS 87 11/07/2021   AST 51 (H) 11/07/2021   ALT 76 (H) 11/07/2021   PROT 6.0 11/07/2021   ALBUMIN 3.9 11/07/2021   CALCIUM 9.0 11/07/2021   GFRAA 79 04/27/2020    Speciality Comments: No specialty comments available.  Procedures:  No procedures performed Allergies: Bupropion, Crestor [rosuvastatin], Losartan, Atorvastatin, Simvastatin, Amoxicillin, Aspirin, Codeine, Erythromycin, and Penicillins   Assessment / Plan:     Visit Diagnoses: No diagnosis found.  ***  Orders: No orders of the defined types were placed in this encounter.  No orders of the defined types were placed in this encounter.    Follow-Up Instructions: No follow-ups on file.   Bertram Savin, RT  Note - This record has been created using Editor, commissioning.  Chart creation errors have been sought, but may not always  have been located. Such creation errors do not reflect on  the standard of medical care.

## 2022-01-03 ENCOUNTER — Ambulatory Visit: Payer: 59 | Admitting: Internal Medicine

## 2022-01-03 DIAGNOSIS — R2233 Localized swelling, mass and lump, upper limb, bilateral: Secondary | ICD-10-CM

## 2022-01-03 DIAGNOSIS — M797 Fibromyalgia: Secondary | ICD-10-CM

## 2022-01-03 DIAGNOSIS — Z79899 Other long term (current) drug therapy: Secondary | ICD-10-CM

## 2022-01-03 DIAGNOSIS — I7300 Raynaud's syndrome without gangrene: Secondary | ICD-10-CM

## 2022-01-05 ENCOUNTER — Other Ambulatory Visit: Payer: Self-pay | Admitting: Family Medicine

## 2022-01-05 ENCOUNTER — Other Ambulatory Visit (HOSPITAL_COMMUNITY): Payer: Self-pay

## 2022-01-05 MED ORDER — VITAMIN D (ERGOCALCIFEROL) 1.25 MG (50000 UNIT) PO CAPS
50000.0000 [IU] | ORAL_CAPSULE | ORAL | 1 refills | Status: DC
  Filled 2022-01-05 – 2022-01-18 (×2): qty 12, 84d supply, fill #0
  Filled 2022-04-04: qty 12, 84d supply, fill #1

## 2022-01-06 ENCOUNTER — Other Ambulatory Visit (HOSPITAL_COMMUNITY): Payer: Self-pay

## 2022-01-09 ENCOUNTER — Ambulatory Visit: Payer: 59 | Admitting: Podiatry

## 2022-01-09 DIAGNOSIS — R234 Changes in skin texture: Secondary | ICD-10-CM

## 2022-01-09 NOTE — Progress Notes (Signed)
Chief Complaint  Patient presents with   Callouses    Patient is here for foot care, patient states that she has corn/ callous on left 2nd toe on the top of the toe, cracked heels sometimes, patient also had ingrown left great toe a few years ago and just wants it checked.    HPI: 61 y.o. female presenting today as a reestablish new patient for evaluation of a symptomatic callus to the medial aspect of the left second toe as well as fissuring of the heels bilateral.  Patient admits to walking around the house barefoot.  She presents for further treatment and evaluation  Past Medical History:  Diagnosis Date   Acute bronchitis 05/25/2016   Anxiety    Arthritis    Chronic pain syndrome 01/06/2013   Chronic rhinosinusitis    Colon polyps    Cough 01/06/2013   Depression    Diabetes ([place]) 01/06/2013   Diabetes mellitus type 2 in obese [facility]) 01/06/2013   Sees Dr Calvert Cantor for eye exam Does not see podiatry, foot exam today unremarkable except for thick cracking skin on heals  pt denies    Dyspnea    with exertion    Dysuria 05/25/2016   Edema 01/06/2013   Epistaxis 05/25/2016   Fibroid, uterine    Fibromyalgia    GERD (gastroesophageal reflux disease)    Glaucoma 03/2012   Hoarseness    Hoarseness of voice 05/11/2013   Hyperglycemia 04/13/2013   Hyperlipemia, mixed 06/06/2007   Qualifier: Diagnosis of  By: Wynona Luna She feels Lipitor caused increased low back pain and weakness     Hyperlipidemia    Hypertension    Hypokalemia 02/05/2013   IBS (irritable bowel syndrome) 02/13/2011   Low back pain 03/03/2013   Muscle cramp 05/22/2014   Obesity    Obesity, unspecified 05/11/2013   OSA (obstructive sleep apnea)    cpap    Pain in joint, shoulder region 06/27/2015   Perimenopause 10/05/2012   Pre-diabetes    Raynaud disease 06/16/2013   Sleep apnea    Thyroid disease    hypothyroidism   Vocal fold nodules     Past Surgical History:  Procedure Laterality  Date   ABDOMINAL HYSTERECTOMY     BREAST EXCISIONAL BIOPSY Right    at age 29   BREAST SURGERY  lump remove breast   age 46 yrs   CHOLECYSTECTOMY     colonoscopoy      CYSTECTOMY     DILATION AND CURETTAGE OF UTERUS     x2   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   Procedure: LAPAROSCOPIC ROUX-EN-Y GASTRIC BYPASS WITH UPPER ENDOSCOPY;  Surgeon: [name], MD;  Location[facility];  Service: General;  Laterality: N/A;   HERNIA REPAIR  5/22   I & D EXTREMITY Left 04/11/2018   Procedure: DEBIDMENT DISTAL INTERPHALANGEAL LEFT MIDDLE;  Surgeon: Daryll Brod, MD;  Location: [place];  Service: Orthopedics;  Laterality: Left;   INCISIONAL HERNIA REPAIR  08/10/2020   Procedure: LAPAROSCOPIC PRIMARY REPAIR OF INCISIONAL HERNIA;  Surgeon: [name], MD;  Location[facility];  Service: General;;   MASS EXCISION Left 04/11/2018   Procedure: EXCISION MASS;  Surgeon: Daryll Brod, MD;  Location: [place];  Service: Orthopedics;  Laterality: Left;   OOPHORECTOMY     POLYPECTOMY     ROTATOR CUFF REPAIR     UPPER GI ENDOSCOPY N/A 08/10/2020   Procedure: UPPER GI ENDOSCOPY;  Surgeon: [name], MD;  [facility];  Service: General;  Laterality: N/A;    Allergies  Allergen Reactions   Bupropion Other (See Comments)    Dizziness and mental status changes Other reaction(s): Dizziness   Crestor [Rosuvastatin] Other (See Comments)    Myalgias and rising CPK   Losartan Cough   Atorvastatin     myalgias   Simvastatin     NDC Code:16714068101 NDC Code:16714068101 NDC JQGB:20100712197 Other reaction(s): Myalgias (Muscle Pain)   Amoxicillin Rash   Aspirin Nausea Only and Other (See Comments)    GI upset REACTION: Upset stomach Other reaction(s): Vomiting   Codeine Rash   Erythromycin Rash and Other (See Comments)   Penicillins Rash    Reaction: 15 years     Physical Exam: General: The patient is alert and oriented x3 in no acute distress.  Dermatology:  Skin is cool to touch.  No open wounds.  She does have superficial fissuring of the heels bilateral.  There is also a symptomatic corn/callus to the medial aspect of the left second toe overlying the DIPJ  Vascular: Palpable pedal pulses bilaterally. Capillary refill delayed secondary to Raynaud's phenomena.  Negative for any significant edema or erythema  Neurological: Light touch and protective threshold grossly intact  Musculoskeletal Exam: No pedal deformities noted   Assessment: 1.  Fissuring of skin bilateral heels 2.  Symptomatic corn/callus left second toe   Plan of Care:  1. Patient evaluated.  2.  Advised the patient against going barefoot.  Recommend good supportive socks and shoes daily 3.  Recommend OTC diabetic foot lotion 2 times daily to the heels 4.  Excisional debridement of the hyperkeratotic callus to the left second toe was performed today with a 312 scalpel without incident or bleeding.  Patient did feel some relief.  Recommend OTC corn callus remover daily as needed 5.  Return to clinic as needed     Edrick Kins, DPM [facility]  Dr. Edrick Kins, DPM    [address],  [address]                Office [phone]  Fax [phone]

## 2022-01-10 ENCOUNTER — Encounter: Payer: Self-pay | Admitting: Physical Therapy

## 2022-01-10 ENCOUNTER — Encounter: Payer: 59 | Admitting: Physical Therapy

## 2022-01-10 DIAGNOSIS — R279 Unspecified lack of coordination: Secondary | ICD-10-CM | POA: Diagnosis not present

## 2022-01-10 DIAGNOSIS — R102 Pelvic and perineal pain unspecified side: Secondary | ICD-10-CM

## 2022-01-10 DIAGNOSIS — M6281 Muscle weakness (generalized): Secondary | ICD-10-CM

## 2022-01-10 DIAGNOSIS — R252 Cramp and spasm: Secondary | ICD-10-CM

## 2022-01-10 DIAGNOSIS — M62838 Other muscle spasm: Secondary | ICD-10-CM | POA: Diagnosis not present

## 2022-01-10 NOTE — Therapy (Addendum)
OUTPATIENT PHYSICAL THERAPY TREATMENT NOTE   Patient Name: [name] MRN: [id number] DOB:[birth year], 61 y.o., female Today's Date: 01/10/2022  PCP: Mosie Lukes, MD REFERRING PROVIDER: Jaquita Folds, MD  END OF SESSION:   PT End of Session - 01/10/22 1134     Visit Number 4    Date for PT Re-Evaluation 03/07/22    Authorization Type UMR    Authorization - Number of Visits 4    Progress Note Due on Visit 25    PT Start Time 1130    PT Stop Time 1215    PT Time Calculation (min) 45 min    Activity Tolerance Patient tolerated treatment well    Behavior During Therapy [facility] for tasks assessed/performed             Past Medical History:  Diagnosis Date   Acute bronchitis 05/25/2016   Anxiety    Arthritis    Chronic pain syndrome 01/06/2013   Chronic rhinosinusitis    Colon polyps    Cough 01/06/2013   Depression    Diabetes ([place]) 01/06/2013   Diabetes mellitus type 2 in obese ([place]) 01/06/2013   Sees Dr Calvert Cantor for eye exam Does not see podiatry, foot exam today unremarkable except for thick cracking skin on heals  pt denies    Dyspnea    with exertion    Dysuria 05/25/2016   Edema 01/06/2013   Epistaxis 05/25/2016   Fibroid, uterine    Fibromyalgia    GERD (gastroesophageal reflux disease)    Glaucoma 03/2012   Hoarseness    Hoarseness of voice 05/11/2013   Hyperglycemia 04/13/2013   Hyperlipemia, mixed 06/06/2007   Qualifier: Diagnosis of  By: Wynona Luna She feels Lipitor caused increased low back pain and weakness     Hyperlipidemia    Hypertension    Hypokalemia 02/05/2013   IBS (irritable bowel syndrome) 02/13/2011   Low back pain 03/03/2013   Muscle cramp 05/22/2014   Obesity    Obesity, unspecified 05/11/2013   OSA (obstructive sleep apnea)    cpap    Pain in joint, shoulder region 06/27/2015   Perimenopause 10/05/2012   Pre-diabetes    Raynaud disease 06/16/2013   Sleep apnea    Thyroid disease    hypothyroidism    Vocal fold nodules    Past Surgical History:  Procedure Laterality Date   ABDOMINAL HYSTERECTOMY     BREAST EXCISIONAL BIOPSY Right    at age 52   BREAST SURGERY  lump remove breast   age 13 yrs   CHOLECYSTECTOMY     colonoscopoy      CYSTECTOMY     DILATION AND CURETTAGE OF UTERUS     x2   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   Procedure: LAPAROSCOPIC ROUX-EN-Y GASTRIC BYPASS WITH UPPER ENDOSCOPY;  Surgeon: [name], MD;  Location: [facility];  Service: General;  Laterality: N/A;   HERNIA REPAIR  5/22   I & D EXTREMITY Left 04/11/2018   Procedure: DEBIDMENT DISTAL INTERPHALANGEAL LEFT MIDDLE;  Surgeon: Daryll Brod, MD;  Location: [place];  Service: Orthopedics;  Laterality: Left;   INCISIONAL HERNIA REPAIR  08/10/2020   Procedure: LAPAROSCOPIC PRIMARY REPAIR OF INCISIONAL HERNIA;  Surgeon: [name], MD;  Location: [facility];  Service: General;;   MASS EXCISION Left 04/11/2018   Procedure: EXCISION MASS;  Surgeon: Daryll Brod, MD;  Location: [facility];  Service: Orthopedics;  Laterality: Left;   OOPHORECTOMY  POLYPECTOMY     ROTATOR CUFF REPAIR     UPPER GI ENDOSCOPY N/A 08/10/2020   Procedure: UPPER GI ENDOSCOPY;  Surgeon: [name], MD;  Location: [facility];  Service: General;  Laterality: N/A;   Patient Active Problem List   Diagnosis Date Noted   Tinea corporis 11/07/2021   Kidney stone 11/07/2021   Hematuria 08/05/2021   Pelvic pain 08/05/2021   Vitamin B12 deficiency 05/09/2021   Diverticula of intestine 03/16/2021   Sciatica 03/16/2021   Abnormal ANCA test 03/16/2021   High risk medication use 03/16/2021   Familial hypercholesterolemia 12/31/2020   Fibromyalgia 12/20/2020   Raynaud's phenomenon 12/07/2020   Gastric bypass status for obesity 08/11/2020   Chronic idiopathic constipation 07/15/2020   Mild CAD 05/13/2020   Anal pain 04/12/2020   Dysphonia 04/12/2020   Epigastric pain 04/12/2020   Family history of malignant  neoplasm of gastrointestinal tract 04/12/2020   Left lower quadrant pain 04/12/2020   Thoracic and lumbosacral neuritis 04/12/2020   Vocal fold nodules    Thyroid disease    Hypertension    GERD (gastroesophageal reflux disease)    Fibroid, uterine    Depressive disorder    Chronic rhinosinusitis    Anxiety    Hemorrhoids 02/22/2020   Statin intolerance 12/28/2019   Elevated CK 11/12/2019   Lymphadenopathy 11/12/2019   Neck pain 11/04/2019   Cervical radiculopathy 05/19/2019   Numbness and tingling 04/17/2019   Tachycardia 56/25/6389   Umbilical hernia 37/34/2876   Sun-damaged skin 01/19/2019   Preventative health care 01/19/2019   Right arm pain 01/16/2019   Carpal tunnel syndrome of right wrist 09/04/2018   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/26/2018   Bilateral hand pain 01/30/2018   Mucoid cyst, joint 01/30/2018   Nodule of finger of both hands 01/10/2018   Weakness 07/03/2017   Osteoarthritis of spine with radiculopathy, cervical region 07/24/2016   Dysuria 05/25/2016   Arthritis 05/25/2016   Obesity (BMI 30.0-34.9) 02/18/2016   Left shoulder pain 06/27/2015   Pain in joint, shoulder region 06/27/2015   Myalgia 03/27/2015   Muscle cramp 05/22/2014   Mass of soft tissue of neck 02/23/2014   AP (abdominal pain) 02/15/2014   Hoarseness of voice 05/11/2013   Backache 03/03/2013   Low back pain 03/03/2013   Hypokalemia 02/05/2013   Edema 01/06/2013   Diabetes mellitus type 2 in obese ([place]) 01/06/2013   Chronic pain syndrome 01/06/2013   Cough 01/06/2013   Perimenopause 10/05/2012   Glaucoma    SOB (shortness of breath) 09/10/2011   Constipation 02/13/2011   Vitamin D deficiency 02/13/2011   IBS (irritable bowel syndrome) 02/13/2011   RHINITIS, CHRONIC 01/14/2010   Atypical chest pain 06/16/2008   THYROMEGALY 11/11/2007   Allergic rhinitis 11/11/2007   ECZEMA, HANDS 07/22/2007   Hypothyroidism 06/06/2007   Hyperlipemia, mixed 06/06/2007   ADJ DISORDER  WITH MIXED ANXIETY & DEPRESSED MOOD 06/06/2007   OSA (obstructive sleep apnea) 06/06/2007   COLONIC POLYPS, HX OF 06/06/2007   REFERRING DIAG: O11.572 (ICD-10-CM) - Levator spasm   THERAPY DIAG:  Cramp and spasm   Unspecified lack of coordination   Muscle weakness (generalized)   Pelvic pain   Rationale for Evaluation and Treatment Rehabilitation   ONSET DATE: 2015   SUBJECTIVE:  SUBJECTIVE STATEMENT: I had the deep aching pain this morning.    PAIN:  Are you having pain? Yes NPRS scale: 7/10 Pain location:  pelvic especially the left side   Pain type: aching Pain description: intermittent    Aggravating factors: when constipated, comes on randomly Relieving factors: heat pad   PRECAUTIONS: None   WEIGHT BEARING RESTRICTIONS No   FALLS:  Has patient fallen in last 6 months? No   LIVING ENVIRONMENT: Lives with: lives with their partner   OCCUPATION: watches computer monitors   PLOF: Independent   PATIENT GOALS manage pain, understand how to improve pelvic health and bowel movements   PERTINENT HISTORY:  Abdominal hysterectomy; Gastric Roux-en-y 2022; Hernia repair; chronic pain syndrome; Diabetes; fibromyalgia; glaucoma; OSA; Hypothyroidism; IBS   BOWEL MOVEMENT Pain with bowel movement: Yes, when constipated and straining Type of bowel movement:Type (Bristol Stool Scale) Type 1,2,3,4,5,6,7, Frequency every 3 days and sometimes uses an enema , Strain Yes, and Splinting no Fully empty rectum: No Leakage: No Fiber supplement: Yes: Miralax; currently being seen by GI and waiting for anorectal manometry in November   URINATION Pain with urination: No, pressure Fully empty bladder: No Stream: Weak Urgency: No Frequency: average Leakage:  none Pads: No      PROLAPSE None       OBJECTIVE:    DIAGNOSTIC FINDINGS:  PVR of 5 ml was obtained by bladder scan.   COGNITION:            Overall cognitive status: Within functional limits for tasks assessed , she trouble remembering things. She reports she has brought this to the doctors attention.                 SENSATION:            Light touch: Appears intact            Proprioception: Appears intact                    POSTURE: No Significant postural limitations               PELVIC ALIGNMENT:   LUMBARAROM/PROM   A/PROM A/PROM  eval  Flexion full  Extension full  Right lateral flexion Decreased by 25%  Left lateral flexion Decreased by 25%  Right rotation Decreased by 25%  Left rotation Decreased by 25%   (Blank rows = not tested)   LOWER EXTREMITY UMP:[id number] hip ROM isfull     LOWER EXTREMITY MMT:   MMT Right eval Left eval  Hip extension 4/5 4/5  Hip abduction 4/5 4/5     PALPATION:   General  tenderness located throughout the abdomen, increased rib cage angle, lower abdominals contract minimally, tenderness located on the hip adductor origin                 External Perineal Exam tenderness located in the perineal body, around the rectum and anterior to coccyx bone                             Internal Pelvic Floor tenderness in the introitus, bil. Levator ani, obturator internist, along the sphincters and anococcygeal ligament   Patient confirms identification and approves PT to assess internal pelvic floor and treatment Yes   PELVIC MMT:   MMT eval 01/10/2022  Vaginal 2/5 ant. Post. But 1/5 on the sides and for 1 sec 2/5 with good hug of therapist finger  Internal Anal Sphincter 1/5   External Anal Sphincter 1/5   Puborectalis 2/5   (Blank rows = not tested)         TONE: Increased tone vaginally and rectally   PROLAPSE: none   TODAY'S TREATMENT  01/10/2022 Manual: Internal pelvic floor techniques:No emotional/communication barriers or cognitive  limitation. Patient is motivated to learn. Patient understands and agrees with treatment goals and plan. PT explains patient will be examined in standing, sitting, and lying down to see how their muscles and joints work. When they are ready, they will be asked to remove their underwear so PT can examine their perineum. The patient is also given the option of providing their own chaperone as one is not provided in our facility. The patient also has the right and is explained the right to defer or refuse any part of the evaluation or treatment including the internal exam. With the patient's consent, PT will use one gloved finger to gently assess the muscles of the pelvic floor, seeing how well it contracts and relaxes and if there is muscle symmetry. After, the patient will get dressed and PT and patient will discuss exam findings and plan of care. PT and patient discuss plan of care, schedule, attendance policy and HEP activities.  Going through the vagina work on the sides of the introitus, along the levator ani and obturator internist with one finger internally and other moving her hip around.. Pressing on trigger points for 90 seconds to have them release,  Going through the vaginal and working on the anterior wall of the vagina and other hand externally on the scar to work on fascial release    12/27/2021 Manual: Soft tissue mobilization:to bilateral hip adductors to relax the muscles Scar tissue mobilization:scar massage to the lower abdomen going through the restrictions.  Myofascial release: tissue rolling of the abdomen; fascial release around the umbilicus and lower abdomen.  : Self-care: Educated patient on how to massage the perineum and buttocks to relax the  muscles.     12/20/2021 Manual: Soft tissue mobilization:manual work to the diaphragm Scar tissue mobilization: to the scar suprapubically  Myofascial release:release suprapubically lifting the tissue up off the bladder, release fo the  mesenteric root, release of the left inguinal area, tissue rolling of the upper abdominal release around the umbilicus Neuromuscular re-education: Down training:diaphragmatic breathing with therapist assisting the downward motion of the lower rib cage Exercises: Stretches/mobility:sitting piriformis stretch holding for 30 sec bil.  Sitting hamstring stretch holding 30 sec bil.  Happy baby sitting holding 30 sec Sitting hip adductor stretch holding for 30 sec bil.  Sitting hip flexor stretch Self-care: Educated patient on mediation and sent her links to the you tube videos     PATIENT EDUCATION: 12/20/2021 Education details: Access Code: [email or web address], educated patient on perineal massage.  Person educated: Patient Education method: Explanation, Demonstration, Tactile cues, Verbal cues, and Handouts Education comprehension: verbalized understanding, returned demonstration, verbal cues required, tactile cues required, and needs further education         HOME EXERCISE PROGRAM: 12/20/2021 Access Code: [email or web address] URL: [email or web address] Date: 12/20/2021 Prepared by: Earlie Counts   Exercises   - Seated Piriformis Stretch with Trunk Bend  - 1 x daily - 7 x weekly - 1 sets - 2 reps - 30 sec hold - Seated Hamstring Stretch  - 1 x daily - 7 x weekly - 1 sets - 2 reps - 30 sec hold - Seated Happy Baby With Trunk Flexion For Pelvic Relaxation  -  1 x daily - 7 x weekly - 1 sets - 1 reps - 30 sec hold - Seated Hip Adductor Stretch  - 1 x daily - 7 x weekly - 1 sets - 2 reps - 30 sec hold - Seated Hip Flexor Stretch  - 1 x daily - 7 x weekly - 1 sets - 2 reps - 30 sec hold - Supine Diaphragmatic Breathing  - 2 x daily - 7 x weekly - 1 sets - 10 reps   ASSESSMENT:   CLINICAL IMPRESSION: Patient is a 61 y.o. female who was seen today for physical therapy  treatment for levator spasm.  Patient had many trigger points in the pelvic floor muscles. Pelvic floor strength has increased to  2/5 with good hug of therapist finger. She is slow to recruit her muscles. She is still having pain with bowel movements. Patient will benefit from skilled therapy to improve pelvic floor coordination so she is able to have a bowel movement with greater ease and reduce her pain.      OBJECTIVE IMPAIRMENTS decreased activity tolerance, decreased endurance, decreased strength, increased fascial restrictions, increased muscle spasms, impaired tone, and pain.    ACTIVITY LIMITATIONS carrying, lifting, bending, sitting, standing, squatting, and toileting   PARTICIPATION LIMITATIONS: meal prep, cleaning, laundry, and community activity   PERSONAL FACTORS Age, Time since onset of injury/illness/exacerbation, and 3+ comorbidities:    Abdominal hysterectomy; Gastric Roux-en-y 2022; Hernia repair; chronic pain syndrome; Diabetes; fibromyalgia; glaucoma; OSA; Hypothyroidism; IBS are also affecting patient's functional outcome.    REHAB POTENTIAL: Good   CLINICAL DECISION MAKING: Evolving/moderate complexity   EVALUATION COMPLEXITY: Moderate     GOALS: Goals reviewed with patient? Yes   SHORT TERM GOALS: Target date: 01/10/2022   Patient independent with initial HEP to relax the pelvic floor with mediation, hip stretches and diaphragmatic breathing.  Baseline: Goal status: Met 12/27/2021   2.  Patient is able to bulge her pelvic floor with correct breathing.  Baseline:  Goal status: INITIAL   3.  Patient understands how to massage her pelvic floor to relax the  muscles.  Baseline:  Goal status: INITIAL       LONG TERM GOALS: Target date: 03/07/2022    Patient independent with advanced HEP for pelvic floor relaxation and core strengthening.  Baseline:  Goal status: INITIAL   2.  Patient is able to push her stool out with >/= 50% greater ease.  Baseline:  Goal status: INITIAL   3.  Patient reports her pain is </= 3/10 50%of the day.  Baseline:  Goal status: INITIAL   4.  Patient  understands ways to manage her pain with different behavioral techniques.  Baseline:  Goal status: INITIAL   5.  Pelvic floor strength >/= 3/5 with circular contraction and able to fully relax. Baseline:  Goal status: INITIAL   6.  Patient is able to push the therapist finger out of the rectum due to improved pelvic floor coordination.  Baseline:  Goal status: INITIAL   PLAN: PT FREQUENCY: 1x/week   PT DURATION: 12 weeks   PLANNED INTERVENTIONS: Therapeutic exercises, Therapeutic activity, Neuromuscular re-education, Patient/Family education, Self Care, Joint mobilization, Dry Needling, Electrical stimulation, Spinal mobilization, Cryotherapy, Moist heat, Taping, Biofeedback, and Manual therapy   PLAN FOR NEXT SESSION: try work around the introitus and levator abdominal work, manual work on thighs, bulging of the pelvic floor, contraction of the abdomen, educate on her working on the muscles internally     Earlie Counts, PT  01/10/22 12:27 PM   PHYSICAL THERAPY DISCHARGE SUMMARY  Visits from Start of Care: 4  Current functional level related to goals / functional outcomes: See above. Patient cancelled her remaining appts. due to feeling better.    Remaining deficits: See above.    Education / Equipment: HEP   Patient agrees to discharge. Patient goals were not met. Patient is being discharged due to being pleased with the current functional level. Thank you for the referral. Earlie Counts, PT 02/14/22 10:39 AM

## 2022-01-11 DIAGNOSIS — H40013 Open angle with borderline findings, low risk, bilateral: Secondary | ICD-10-CM | POA: Diagnosis not present

## 2022-01-17 ENCOUNTER — Encounter: Payer: Self-pay | Admitting: Cardiology

## 2022-01-17 ENCOUNTER — Other Ambulatory Visit (HOSPITAL_COMMUNITY): Payer: Self-pay

## 2022-01-17 DIAGNOSIS — G5603 Carpal tunnel syndrome, bilateral upper limbs: Secondary | ICD-10-CM | POA: Diagnosis not present

## 2022-01-17 DIAGNOSIS — I7300 Raynaud's syndrome without gangrene: Secondary | ICD-10-CM | POA: Diagnosis not present

## 2022-01-18 ENCOUNTER — Other Ambulatory Visit (HOSPITAL_COMMUNITY): Payer: Self-pay

## 2022-01-18 ENCOUNTER — Encounter: Payer: Self-pay | Admitting: Obstetrics and Gynecology

## 2022-01-18 ENCOUNTER — Ambulatory Visit: Payer: 59 | Admitting: Obstetrics and Gynecology

## 2022-01-18 VITALS — BP 138/91 | HR 80

## 2022-01-18 DIAGNOSIS — M62838 Other muscle spasm: Secondary | ICD-10-CM

## 2022-01-18 NOTE — Progress Notes (Signed)
[facility] Return Visit  SUBJECTIVE  History of Present Illness: [name] is a 61 y.o. female seen in follow-up for pelvic floor muscle spasm. Plan at last visit was to start physical therapy and to start vaginal valium '5mg'$ .   She tried the valium for a few days but it made her too sedated so she stopped it.   She has started PT and she feels it is going well. She is using a heating pad when she has a lot of discomfort.    Past Medical History: Patient  has a past medical history of Acute bronchitis (05/25/2016), Anxiety, Arthritis, Chronic pain syndrome (01/06/2013), Chronic rhinosinusitis, Colon polyps, Cough (01/06/2013), Depression, Diabetes ([place]) (01/06/2013), Diabetes mellitus type 2 in obese ([place]) (01/06/2013), Dyspnea, Dysuria (05/25/2016), Edema (01/06/2013), Epistaxis (05/25/2016), Fibroid, uterine, Fibromyalgia, GERD (gastroesophageal reflux disease), Glaucoma (03/2012), Hoarseness, Hoarseness of voice (05/11/2013), Hyperglycemia (04/13/2013), Hyperlipemia, mixed (06/06/2007), Hyperlipidemia, Hypertension, Hypokalemia (02/05/2013), IBS (irritable bowel syndrome) (02/13/2011), Low back pain (03/03/2013), Muscle cramp (05/22/2014), Obesity, Obesity, unspecified (05/11/2013), OSA (obstructive sleep apnea), Pain in joint, shoulder region (06/27/2015), Perimenopause (10/05/2012), Pre-diabetes, Raynaud disease (06/16/2013), Sleep apnea, Thyroid disease, and Vocal fold nodules.   Past Surgical History: She  has a past surgical history that includes Cholecystectomy; Abdominal hysterectomy; Cystectomy; Dilation and curettage of uterus; Polypectomy; Oophorectomy; Rotator cuff repair; Mass excision (Left, 04/11/2018); I & D extremity (Left, 04/11/2018); Breast excisional biopsy (Right); colonoscopoy ; Gastric Roux-En-Y (N/A, 08/10/2020); Upper gi endoscopy (N/A, 08/10/2020); Incisional hernia repair (08/10/2020); Breast surgery (lump remove breast); and Hernia repair (5/22).    Medications: She has a current medication list which includes the following prescription(s): acarbose, albuterol, azelastine, bimatoprost, buspirone, calcium carbonate, calcium carbonate, cholecalciferol, diazepam, repatha sureclick, fluticasone, furosemide, gabapentin, ipratropium, ipratropium, itraconazole, loratadine, lubiprostone, lubiprostone, metoprolol tartrate, savella, modafinil, multivitamin with minerals, nifedipine, pantoprazole, motegrity, spironolactone, thyroid, and vitamin d (ergocalciferol).   Allergies: Patient is allergic to bupropion, crestor [rosuvastatin], losartan, atorvastatin, simvastatin, amoxicillin, aspirin, codeine, erythromycin, and penicillins.   Social History: Patient  reports that she quit smoking about 18 years ago. Her smoking use included cigarettes. She has a 15.00 pack-year smoking history. She has never used smokeless tobacco. She reports that she does not drink alcohol and does not use drugs.      OBJECTIVE     Physical Exam: Vitals:   01/18/22 0805  BP: (!) 138/91  Pulse: 80   Gen: No apparent distress, A&O x 3.  Detailed Urogynecologic Evaluation:  Deferred.    ASSESSMENT AND PLAN    [name] is a 61 y.o. with:  1. Levator spasm    - Offered a different muscle relaxant instead of valium but she is worried about sedation and prefers not to take one.  - Also discussed option of trigger point injections as well.  - For now, she will continue with PT and contact us if she feels she needs the trigger point injections.   Follow up as needed  Jaquita Folds, MD  Time spent: I spent 17 minutes dedicated to the care of this patient on the date of this encounter to include pre-visit review of records, face-to-face time with the patient and post visit documentation.

## 2022-01-24 ENCOUNTER — Encounter: Payer: 59 | Admitting: Physical Therapy

## 2022-01-31 ENCOUNTER — Encounter: Payer: Self-pay | Admitting: Physical Therapy

## 2022-02-01 ENCOUNTER — Other Ambulatory Visit: Payer: Self-pay | Admitting: Family Medicine

## 2022-02-01 ENCOUNTER — Other Ambulatory Visit (HOSPITAL_COMMUNITY): Payer: Self-pay

## 2022-02-01 MED ORDER — MODAFINIL 200 MG PO TABS
400.0000 mg | ORAL_TABLET | Freq: Every day | ORAL | 5 refills | Status: DC
  Filled 2022-02-01: qty 60, 30d supply, fill #0
  Filled 2022-03-05: qty 60, 30d supply, fill #1
  Filled 2022-04-04: qty 60, 30d supply, fill #2
  Filled 2022-05-15: qty 60, 30d supply, fill #3
  Filled 2022-06-13: qty 60, 30d supply, fill #4

## 2022-02-02 ENCOUNTER — Other Ambulatory Visit (HOSPITAL_COMMUNITY): Payer: Self-pay

## 2022-02-06 ENCOUNTER — Other Ambulatory Visit (HOSPITAL_COMMUNITY): Payer: Self-pay

## 2022-02-07 ENCOUNTER — Encounter: Payer: Self-pay | Admitting: Physical Therapy

## 2022-02-12 DIAGNOSIS — R7989 Other specified abnormal findings of blood chemistry: Secondary | ICD-10-CM | POA: Insufficient documentation

## 2022-02-12 NOTE — Progress Notes (Unsigned)
Subjective:    Patient ID: [name], female    DOB: 1961/01/26, 61 y.o.   MRN: [id number]  No chief complaint on file.   HPI Patient is in today for follow up on chronic medical concerns, no recent febrile illness or acute hospitalizations. No complaints of polyuria or polydipsia. Denies CP/palp/SOB/HA/congestion/fevers/GI or GU c/o. Taking meds as prescribed Tries to maintain a heart healthy diet and to stay active.   Past Medical History:  Diagnosis Date   Acute bronchitis 05/25/2016   Anxiety    Arthritis    Chronic pain syndrome 01/06/2013   Chronic rhinosinusitis    Colon polyps    Cough 01/06/2013   Depression    Diabetes ([place]) 01/06/2013   Diabetes mellitus type 2 in obese [facility]) 01/06/2013   Sees Dr Calvert Cantor for eye exam Does not see podiatry, foot exam today unremarkable except for thick cracking skin on heals  pt denies    Dyspnea    with exertion    Dysuria 05/25/2016   Edema 01/06/2013   Epistaxis 05/25/2016   Fibroid, uterine    Fibromyalgia    GERD (gastroesophageal reflux disease)    Glaucoma 03/2012   Hoarseness    Hoarseness of voice 05/11/2013   Hyperglycemia 04/13/2013   Hyperlipemia, mixed 06/06/2007   Qualifier: Diagnosis of  By: Wynona Luna She feels Lipitor caused increased low back pain and weakness     Hyperlipidemia    Hypertension    Hypokalemia 02/05/2013   IBS (irritable bowel syndrome) 02/13/2011   Low back pain 03/03/2013   Muscle cramp 05/22/2014   Obesity    Obesity, unspecified 05/11/2013   OSA (obstructive sleep apnea)    cpap    Pain in joint, shoulder region 06/27/2015   Perimenopause 10/05/2012   Pre-diabetes    Raynaud disease 06/16/2013   Sleep apnea    Thyroid disease    hypothyroidism   Vocal fold nodules     Past Surgical History:  Procedure Laterality Date   ABDOMINAL HYSTERECTOMY     BREAST EXCISIONAL BIOPSY Right    at age 36   BREAST SURGERY  lump remove breast   age 31 yrs    CHOLECYSTECTOMY     colonoscopoy      CYSTECTOMY     DILATION AND CURETTAGE OF UTERUS     x2   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   Procedure: LAPAROSCOPIC ROUX-EN-Y GASTRIC BYPASS WITH UPPER ENDOSCOPY;  Surgeon: [name], MD;  Location: [facility];  Service: General;  Laterality: N/A;   HERNIA REPAIR  5/22   I & D EXTREMITY Left 04/11/2018   Procedure: DEBIDMENT DISTAL INTERPHALANGEAL LEFT MIDDLE;  Surgeon: Daryll Brod, MD;  Location: [place];  Service: Orthopedics;  Laterality: Left;   INCISIONAL HERNIA REPAIR  08/10/2020   Procedure: LAPAROSCOPIC PRIMARY REPAIR OF INCISIONAL HERNIA;  Surgeon: [name], MD;  Location: [facility];  Service: General;;   MASS EXCISION Left 04/11/2018   Procedure: EXCISION MASS;  Surgeon: Daryll Brod, MD;  Location: [place];  Service: Orthopedics;  Laterality: Left;   OOPHORECTOMY     POLYPECTOMY     ROTATOR CUFF REPAIR     UPPER GI ENDOSCOPY N/A 08/10/2020   Procedure: UPPER GI ENDOSCOPY;  Surgeon: [name], MD;  Location: [facility];  Service: General;  Laterality: N/A;    Family History  Problem Relation Age of Onset   Alcohol abuse Father    Cancer Father  renal and colon   Hyperlipidemia Father    Hypertension Father    Stroke Father    Heart disease Father    Cancer Paternal Grandfather        colon   Diabetes Other    High blood pressure Mother    High Cholesterol Mother    Kidney disease Mother    Depression Mother    Anxiety disorder Mother    Obesity Mother    Hypertension Mother    Breast cancer Other 65   Diabetes Sister    Diabetes Brother    Arthritis Maternal Grandmother    Heart disease Paternal Grandmother     Social History   Socioeconomic History   Marital status: Significant Other    Spouse name: Not on file   Number of children: 0   Years of education: Not on file   Highest education level: Not on file  Occupational History   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   Tobacco Use   Smoking status: Former    Packs/day: 0.50    Years: 30.00    Total pack years: 15.00    Types: Cigarettes    Quit date: 04/25/2003    Years since quitting: 18.8   Smokeless tobacco: Never   Tobacco comments:    smoked since age 59  Vaping Use   Vaping Use: Never used  Substance and Sexual Activity   Alcohol use: No   Drug use: Never   Sexual activity: Not Currently    Partners: Male    Birth control/protection: None  Other Topics Concern   Not on file  Social History Narrative   Endo-- Dr Dwyane Dee   ENT--Dr shoemaker   GI--Dr Doniphan   Pulm--Dr Clance   Rheum--Dr Charlestine Night         Social Determinants of Health   Financial Resource Strain: Not on file  Food Insecurity: Not on file  Transportation Needs: Not on file  Physical Activity: Not on file  Stress: Not on file  Social Connections: Not on file  Intimate Partner Violence: Not on file    Outpatient Medications Prior to Visit  Medication Sig Dispense Refill   acarbose (PRECOSE) 25 MG tablet Take 1 tablet (25 mg total) by mouth 3 (three) times daily with meals. 90 tablet 11   albuterol (VENTOLIN HFA) 108 (90 Base) MCG/ACT inhaler Inhale 2 puffs into the lungs every 4 (four) hours as needed for wheezing or shortness of breath. 18 g 0   azelastine (ASTELIN) 0.1 % nasal spray Place 1-2 sprays into both nostrils 2 (two) times daily. 30 mL 12   bimatoprost (LUMIGAN) 0.01 % SOLN Place 1 drop into both eyes daily. 2.5 mL 12   busPIRone (BUSPAR) 7.5 MG tablet Take 1 tablet (7.5 mg total) by mouth 2 (two) times daily. 60 tablet 1   calcium carbonate (OS-CAL - DOSED IN MG OF ELEMENTAL CALCIUM) 1250 (500 Ca) MG tablet      Calcium Carbonate 500 MG CHEW Chew 500 mg by mouth daily at 12 noon.     Cholecalciferol 25 MCG (1000 UT) CHEW Chew by mouth.     diazepam (VALIUM) 5 MG tablet Place 1 tablet vaginally nightly as needed for muscle spasm/ pelvic pain. 30 tablet 1   Evolocumab (REPATHA SURECLICK) 790 MG/ML SOAJ  Inject 140 mg into the skin every 14 (fourteen) days. 6 mL 3   fluticasone (FLONASE) 50 MCG/ACT nasal spray Place 2 sprays into both nostrils daily. 16 g 6   furosemide (  LASIX) 20 MG tablet Take 1 tablet (20 mg total) by mouth daily as needed. 90 tablet 1   gabapentin (NEURONTIN) 300 MG capsule Take 1-2 capsules (300-600 mg total) by mouth every evening. 60 capsule 1   ipratropium (ATROVENT) 0.06 % nasal spray Place 2 sprays into both nostrils 3 (three) times daily as needed for rhinitis 15 mL 12   ipratropium (ATROVENT) 0.06 % nasal spray Place 2 sprays into both nostrils 3 (three) times daily as needed for Rhinitis 15 mL 12   itraconazole (SPORANOX) 100 MG capsule Take 1 capsule (100 mg total) by mouth daily. 14 capsule 0   loratadine (CLARITIN) 10 MG tablet Take 20 mg by mouth daily.     lubiprostone (AMITIZA) 24 MCG capsule Take 1 capsule (24 mcg total) by mouth 2 (two) times daily with food and water 60 capsule 0   lubiprostone (AMITIZA) 24 MCG capsule Take 1 capsule (24 mcg total) by mouth 2 (two) times daily with food and water 60 capsule 0   metoprolol tartrate (LOPRESSOR) 25 MG tablet Take 0.5 tablets (12.5 mg total) by mouth 2 (two) times daily. 60 tablet 1   Milnacipran (SAVELLA) 50 MG TABS tablet Take 1 tablet (50 mg total) by mouth 2 (two) times daily. 60 tablet 5   modafinil (PROVIGIL) 200 MG tablet Take 2 tablets (400 mg total) by mouth daily. 60 tablet 5   Multiple Vitamins-Minerals (MULTIVITAMIN WITH MINERALS) tablet Take 1 tablet by mouth daily.     NIFEdipine (PROCARDIA-XL/NIFEDICAL-XL) 30 MG 24 hr tablet Take 1 tablet (30 mg total) by mouth daily. 30 tablet 3   pantoprazole (PROTONIX) 40 MG tablet Take 1 tablet (40 mg total) by mouth daily. 90 tablet 0   Prucalopride Succinate (MOTEGRITY) 2 MG TABS Take 1 tablet (2 mg total) by mouth daily. 30 tablet 0   spironolactone (ALDACTONE) 50 MG tablet Take 1 tablet (50 mg total) by mouth 3 (three) times daily. 270 tablet 3   thyroid  (ARMOUR) 90 MG tablet Take 1 tablet (90 mg total) by mouth daily. 90 tablet 1   Vitamin D, Ergocalciferol, (DRISDOL) 1.25 MG (50000 UNIT) CAPS capsule Take 1 capsule (50,000 Units total) by mouth every 7 (seven) days. 12 capsule 1   No facility-administered medications prior to visit.    Allergies  Allergen Reactions   Bupropion Other (See Comments)    Dizziness and mental status changes Other reaction(s): Dizziness   Crestor [Rosuvastatin] Other (See Comments)    Myalgias and rising CPK   Losartan Cough   Atorvastatin     myalgias   Simvastatin     NDC Code:16714068101 NDC Code:16714068101 NDC IWLN:[id number] Other reaction(s): Myalgias (Muscle Pain)   Amoxicillin Rash   Aspirin Nausea Only and Other (See Comments)    GI upset REACTION: Upset stomach Other reaction(s): Vomiting   Codeine Rash   Erythromycin Rash and Other (See Comments)   Penicillins Rash    Reaction: 15 years    Review of Systems  Constitutional:  Negative for fever and malaise/fatigue.  HENT:  Negative for congestion.   Eyes:  Negative for blurred vision.  Respiratory:  Negative for shortness of breath.   Cardiovascular:  Negative for chest pain, palpitations and leg swelling.  Gastrointestinal:  Negative for abdominal pain, blood in stool and nausea.  Genitourinary:  Negative for dysuria and frequency.  Musculoskeletal:  Negative for falls.  Skin:  Negative for rash.  Neurological:  Negative for dizziness, loss of consciousness and headaches.  Endo/Heme/Allergies:  Negative for environmental allergies.  Psychiatric/Behavioral:  Negative for depression. The patient is not nervous/anxious.        Objective:    Physical Exam Constitutional:      General: She is not in acute distress.    Appearance: She is well-developed.  HENT:     Head: Normocephalic and atraumatic.  Eyes:     Conjunctiva/sclera: Conjunctivae normal.  Neck:     Thyroid: No thyromegaly.  Cardiovascular:     Rate and  Rhythm: Normal rate and regular rhythm.     Heart sounds: Normal heart sounds. No murmur heard. Pulmonary:     Effort: Pulmonary effort is normal. No respiratory distress.     Breath sounds: Normal breath sounds.  Abdominal:     General: Bowel sounds are normal. There is no distension.     Palpations: Abdomen is soft. There is no mass.     Tenderness: There is no abdominal tenderness.  Musculoskeletal:     Cervical back: Neck supple.  Lymphadenopathy:     Cervical: No cervical adenopathy.  Skin:    General: Skin is warm and dry.  Neurological:     Mental Status: She is alert and oriented to person, place, and time.  Psychiatric:        Behavior: Behavior normal.     There were no vitals taken for this visit. Wt Readings from Last 3 Encounters:  12/09/21 177 lb (80.3 kg)  11/07/21 174 lb 3.2 oz (79 kg)  10/31/21 170 lb 3.2 oz (77.2 kg)    Diabetic Foot Exam - Simple   No data filed    Lab Results  Component Value Date   WBC 3.5 (L) 11/07/2021   HGB 12.3 11/07/2021   HCT 37.5 11/07/2021   PLT 247.0 11/07/2021   GLUCOSE 71 11/07/2021   CHOL 102 11/07/2021   TRIG 64.0 11/07/2021   HDL 61.40 11/07/2021   LDLDIRECT 159.0 11/11/2019   LDLCALC 28 11/07/2021   ALT 76 (H) 11/07/2021   AST 51 (H) 11/07/2021   NA 141 11/07/2021   K 4.1 11/07/2021   CL 104 11/07/2021   CREATININE 0.61 11/07/2021   BUN 8 11/07/2021   CO2 28 11/07/2021   TSH 0.06 (L) 11/07/2021   HGBA1C 6.1 11/07/2021   MICROALBUR 0.8 05/09/2021    Lab Results  Component Value Date   TSH 0.06 (L) 11/07/2021   Lab Results  Component Value Date   WBC 3.5 (L) 11/07/2021   HGB 12.3 11/07/2021   HCT 37.5 11/07/2021   MCV 91.0 11/07/2021   PLT 247.0 11/07/2021   Lab Results  Component Value Date   NA 141 11/07/2021   K 4.1 11/07/2021   CO2 28 11/07/2021   GLUCOSE 71 11/07/2021   BUN 8 11/07/2021   CREATININE 0.61 11/07/2021   BILITOT 0.2 11/07/2021   ALKPHOS 87 11/07/2021   AST 51 (H)  11/07/2021   ALT 76 (H) 11/07/2021   PROT 6.0 11/07/2021   ALBUMIN 3.9 11/07/2021   CALCIUM 9.0 11/07/2021   ANIONGAP 7 08/16/2020   EGFR 58 (L) 06/22/2020   GFR 96.82 11/07/2021   Lab Results  Component Value Date   CHOL 102 11/07/2021   Lab Results  Component Value Date   HDL 61.40 11/07/2021   Lab Results  Component Value Date   LDLCALC 28 11/07/2021   Lab Results  Component Value Date   TRIG 64.0 11/07/2021   Lab Results  Component Value Date   CHOLHDL 2 11/07/2021  Lab Results  Component Value Date   HGBA1C 6.1 11/07/2021       Assessment & Plan:   Problem List Items Addressed This Visit     Hypothyroidism    On Armour Thyrdoid, continue to monitor      Hyperlipemia, mixed    Encourage heart healthy diet such as MIND or DASH diet, increase exercise, avoid trans fats, simple carbohydrates and processed foods, consider a krill or fish or flaxseed oil cap daily.       Vitamin D deficiency    Supplement and monitor      Diabetes mellitus type 2 in obese (HCC)    hgba1c acceptable, minimize simple carbs. Increase exercise as tolerated. Continue current meds      Muscle cramp    Hydrate and monitor      Obesity (BMI 30.0-34.9)    Encouraged DASH or MIND diet, decrease po intake and increase exercise as tolerated. Needs 7-8 hours of sleep nightly. Avoid trans fats, eat small, frequent meals every 4-5 hours with lean proteins, complex carbs and healthy fats. Minimize simple carbs, high fat foods and processed foods      Statin intolerance    Notes increased pain on statins      Hypertension    Well controlled, no changes to meds. Encouraged heart healthy diet such as the DASH diet and exercise as tolerated. Consider flu and covid shots      Vitamin B12 deficiency    Supplement and monitor      Elevated liver function tests    Likely fatty liver disease, minimize simple carbs, fatty foods and processed foods. Increase exercise and monitor        I am having Merisa S. Schneider maintain her loratadine, multivitamin with minerals, Calcium Carbonate, ipratropium, lubiprostone, Cholecalciferol, calcium carbonate, azelastine, acarbose, Repatha SureClick, furosemide, Savella, ipratropium, fluticasone, spironolactone, albuterol, bimatoprost, lubiprostone, gabapentin, NIFEdipine, metoprolol tartrate, Motegrity, diazepam, pantoprazole, thyroid, itraconazole, busPIRone, Vitamin D (Ergocalciferol), and modafinil.  No orders of the defined types were placed in this encounter.    Penni Homans, MD

## 2022-02-12 NOTE — Assessment & Plan Note (Addendum)
On Armour Thyrdoid, continue to monitor

## 2022-02-12 NOTE — Assessment & Plan Note (Signed)
Encouraged DASH or MIND diet, decrease po intake and increase exercise as tolerated. Needs 7-8 hours of sleep nightly. Avoid trans fats, eat small, frequent meals every 4-5 hours with lean proteins, complex carbs and healthy fats. Minimize simple carbs, high fat foods and processed foods 

## 2022-02-12 NOTE — Assessment & Plan Note (Signed)
Encourage heart healthy diet such as MIND or DASH diet, increase exercise, avoid trans fats, simple carbohydrates and processed foods, consider a krill or fish or flaxseed oil cap daily.  °

## 2022-02-12 NOTE — Assessment & Plan Note (Signed)
Hydrate and monitor 

## 2022-02-12 NOTE — Assessment & Plan Note (Signed)
Supplement and monitor 

## 2022-02-12 NOTE — Assessment & Plan Note (Addendum)
Well controlled, no changes to meds. Encouraged heart healthy diet such as the DASH diet and exercise as tolerated. Consider flu and covid shots

## 2022-02-12 NOTE — Assessment & Plan Note (Signed)
Notes increased pain on statins

## 2022-02-12 NOTE — Assessment & Plan Note (Signed)
hgba1c acceptable, minimize simple carbs. Increase exercise as tolerated. Continue current meds 

## 2022-02-12 NOTE — Assessment & Plan Note (Signed)
Likely fatty liver disease, minimize simple carbs, fatty foods and processed foods. Increase exercise and monitor

## 2022-02-13 ENCOUNTER — Encounter: Payer: Self-pay | Admitting: *Deleted

## 2022-02-13 ENCOUNTER — Encounter: Payer: Self-pay | Admitting: Family Medicine

## 2022-02-13 ENCOUNTER — Ambulatory Visit: Payer: 59 | Admitting: Family Medicine

## 2022-02-13 VITALS — BP 122/78 | HR 74 | Temp 98.0°F | Resp 16 | Ht 64.0 in | Wt 179.8 lb

## 2022-02-13 DIAGNOSIS — E1169 Type 2 diabetes mellitus with other specified complication: Secondary | ICD-10-CM | POA: Diagnosis not present

## 2022-02-13 DIAGNOSIS — R252 Cramp and spasm: Secondary | ICD-10-CM

## 2022-02-13 DIAGNOSIS — R768 Other specified abnormal immunological findings in serum: Secondary | ICD-10-CM

## 2022-02-13 DIAGNOSIS — E669 Obesity, unspecified: Secondary | ICD-10-CM | POA: Diagnosis not present

## 2022-02-13 DIAGNOSIS — E538 Deficiency of other specified B group vitamins: Secondary | ICD-10-CM

## 2022-02-13 DIAGNOSIS — E559 Vitamin D deficiency, unspecified: Secondary | ICD-10-CM

## 2022-02-13 DIAGNOSIS — E782 Mixed hyperlipidemia: Secondary | ICD-10-CM

## 2022-02-13 DIAGNOSIS — I7300 Raynaud's syndrome without gangrene: Secondary | ICD-10-CM

## 2022-02-13 DIAGNOSIS — E039 Hypothyroidism, unspecified: Secondary | ICD-10-CM

## 2022-02-13 DIAGNOSIS — I10 Essential (primary) hypertension: Secondary | ICD-10-CM

## 2022-02-13 DIAGNOSIS — Z9884 Bariatric surgery status: Secondary | ICD-10-CM | POA: Diagnosis not present

## 2022-02-13 DIAGNOSIS — Z789 Other specified health status: Secondary | ICD-10-CM

## 2022-02-13 DIAGNOSIS — B354 Tinea corporis: Secondary | ICD-10-CM

## 2022-02-13 DIAGNOSIS — R7989 Other specified abnormal findings of blood chemistry: Secondary | ICD-10-CM

## 2022-02-13 LAB — CBC
HCT: 43.5 % (ref 36.0–46.0)
Hemoglobin: 14.6 g/dL (ref 12.0–15.0)
MCHC: 33.5 g/dL (ref 30.0–36.0)
MCV: 91.7 fl (ref 78.0–100.0)
Platelets: 301 10*3/uL (ref 150.0–400.0)
RBC: 4.75 Mil/uL (ref 3.87–5.11)
RDW: 14 % (ref 11.5–15.5)
WBC: 3.2 10*3/uL — ABNORMAL LOW (ref 4.0–10.5)

## 2022-02-13 LAB — COMPREHENSIVE METABOLIC PANEL
ALT: 68 U/L — ABNORMAL HIGH (ref 0–35)
AST: 46 U/L — ABNORMAL HIGH (ref 0–37)
Albumin: 4.3 g/dL (ref 3.5–5.2)
Alkaline Phosphatase: 94 U/L (ref 39–117)
BUN: 13 mg/dL (ref 6–23)
CO2: 28 mEq/L (ref 19–32)
Calcium: 9.8 mg/dL (ref 8.4–10.5)
Chloride: 104 mEq/L (ref 96–112)
Creatinine, Ser: 0.69 mg/dL (ref 0.40–1.20)
GFR: 93.81 mL/min (ref >60.00)
Glucose, Bld: 86 mg/dL (ref 70–99)
Potassium: 4.4 mEq/L (ref 3.5–5.1)
Sodium: 140 mEq/L (ref 135–145)
Total Bilirubin: 0.3 mg/dL (ref 0.2–1.2)
Total Protein: 6.8 g/dL (ref 6.0–8.3)

## 2022-02-13 LAB — LIPID PANEL
Cholesterol: 130 mg/dL (ref 0–200)
HDL: 86.4 mg/dL (ref >39.00)
LDL Cholesterol: 29 mg/dL (ref 0–99)
NonHDL: 43.45
Total CHOL/HDL Ratio: 2
Triglycerides: 74 mg/dL (ref 0.0–149.0)
VLDL: 14.8 mg/dL (ref 0.0–40.0)

## 2022-02-13 LAB — VITAMIN B12: Vitamin B-12: 541 pg/mL (ref 211–911)

## 2022-02-13 LAB — VITAMIN D 25 HYDROXY (VIT D DEFICIENCY, FRACTURES): VITD: 42.19 ng/mL (ref 30.00–100.00)

## 2022-02-13 LAB — HEMOGLOBIN A1C: Hgb A1c MFr Bld: 6 % (ref 4.6–6.5)

## 2022-02-13 LAB — TSH: TSH: 0.61 u[IU]/mL (ref 0.35–5.50)

## 2022-02-13 LAB — T4, FREE: Free T4: 0.59 ng/dL — ABNORMAL LOW (ref 0.60–1.60)

## 2022-02-13 NOTE — Assessment & Plan Note (Signed)
Lower legs, stable after treatment but still with some scar tissue. Follow up with dermatology

## 2022-02-13 NOTE — Assessment & Plan Note (Addendum)
Hands hurt with cold and stress. She shares numerous photos of her fingers alternating very pale white and violaceous. She has seen vascular and rheumatology and no change in therapy has helped. She did not tolerate Cialis. For now wear thick gloves and socks

## 2022-02-13 NOTE — Patient Instructions (Signed)
Raynaud's Phenomenon  Raynaud's phenomenon is a condition that affects the blood vessels (arteries) that carry blood to the fingers and toes. The arteries that supply blood to the ears, lips, nipples, or the tip of the nose might also be affected. Raynaud's phenomenon causes the arteries to become narrow temporarily (spasm). As a result, the flow of blood to the affected areas is temporarily decreased. This usually occurs in response to cold temperatures or stress. During an attack, the skin in the affected areas turns white, then blue, and finally red. A person may also feel tingling or numbness in those areas. Attacks usually last for only a brief period, and then the blood flow to the area returns to normal. In most cases, Raynaud's phenomenon does not cause serious health problems. What are the causes? In many cases, the cause of this condition is not known. The condition may occur on its own (primary Raynaud's phenomenon) or may be associated with other diseases or factors (secondary Raynaud's phenomenon). Possible causes may include: Diseases or medical conditions that damage the arteries. Injuries and repetitive actions that hurt the hands or feet. Being exposed to certain chemicals. Taking medicines that narrow the arteries. Other medical conditions, such as lupus, scleroderma, rheumatoid arthritis, thyroid problems, blood disorders, Sjogren syndrome, or atherosclerosis. What increases the risk? The following factors may make you more likely to develop this condition: Being 20-40 years old. Being female. Having a family history of Raynaud's phenomenon. Living in a cold climate. Smoking. What are the signs or symptoms? Symptoms of this condition usually occur when you are exposed to cold temperatures or when you have emotional stress. The symptoms may last for a few minutes or up to several hours. They usually affect your fingers but may also affect your toes, nipples, lips, ears, or the  tip of your nose. Symptoms may include: Changes in skin color. The skin in the affected areas will turn pale or white. The skin may then change from white to bluish to red as normal blood flow returns to the area. Numbness, tingling, or pain in the affected areas. In severe cases, symptoms may include: Skin sores. Tissues decaying and dying (gangrene). How is this diagnosed? This condition may be diagnosed based on: Your symptoms and medical history. A physical exam. During the exam, you may be asked to put your hands in cold water to check for a reaction to cold temperature. Tests, such as: Blood tests to check for other diseases or conditions. A test to check the movement of blood through your arteries and veins (vascular ultrasound). A test in which the skin at the base of your fingernail is examined under a microscope (nailfold capillaroscopy). How is this treated? During an episode, you can take actions to help symptoms go away faster. Options include moving your arms around in a windmill pattern, warming your fingers under warm water, or placing your fingers in a warm body fold, such as your armpit. Long-term treatment for this condition often involves making lifestyle changes and taking steps to control your exposure to cold temperature. For more severe cases, medicine (calcium channel blockers) may be used to improve blood circulation. Follow these instructions at home: Avoiding cold temperatures Take these steps to avoid exposure to cold: If possible, stay indoors during cold weather. When you go outside during cold weather, dress in layers and wear mittens, a hat, a scarf, and warm footwear. Wear mittens or gloves when handling ice or frozen food. Use holders for glasses or cans containing   cold drinks. Let warm water run for a while before taking a shower or bath. Warm up the car before driving in cold weather. Lifestyle If possible, avoid stressful and emotional situations. Try  to find ways to manage your stress, such as: Exercise. Yoga. Meditation. Biofeedback. Do not use any products that contain nicotine or tobacco. These products include cigarettes, chewing tobacco, and vaping devices, such as e-cigarettes. If you need help quitting, ask your health care provider. Avoid secondhand smoke. Limit your use of caffeine. Switch to decaffeinated coffee, tea, and soda. Avoid chocolate. Avoid vibrating tools and machinery. General instructions Protect your hands and feet from injuries, cuts, or bruises. Avoid wearing tight rings or wristbands. Wear loose fitting socks and comfortable, roomy shoes. Take over-the-counter and prescription medicines only as told by your health care provider. Where to find support Raynaud's Association: www.raynauds.org Where to find more information National Institute of Arthritis and Musculoskeletal and Skin Diseases: www.niams.nih.gov Contact a health care provider if: Your discomfort becomes worse despite lifestyle changes. You develop sores on your fingers or toes that do not heal. You have breaks in the skin on your fingers or toes. You have a fever. You have pain or swelling in your joints. You have a rash. Your symptoms occur on only one side of your body. Get help right away if: Your fingers or toes turn black. You have severe pain in the affected areas. These symptoms may represent a serious problem that is an emergency. Do not wait to see if the symptoms will go away. Get medical help right away. Call your local emergency services (911 in the [place] Do not drive yourself to the hospital. Summary Raynaud's phenomenon is a condition that affects the arteries that carry blood to the fingers, toes, ears, lips, nipples, or the tip of the nose. In many cases, the cause of this condition is not known. Symptoms of this condition include changes in skin color along with numbness and tingling in the affected area. Treatment for  this condition includes lifestyle changes and reducing exposure to cold temperatures. Medicines may be used for severe cases of the condition. Contact your health care provider if your condition worsens despite treatment. This information is not intended to replace advice given to you by your health care provider. Make sure you discuss any questions you have with your health care provider. Document Revised: 06/15/2020 Document Reviewed: 06/15/2020 Elsevier Patient Education  2023 [employer]  

## 2022-02-13 NOTE — Assessment & Plan Note (Signed)
With notable generalized weakness as well. She has an appointment with rheumatology next month. Consider neurology consultation if no improvement

## 2022-02-14 ENCOUNTER — Other Ambulatory Visit: Payer: Self-pay | Admitting: Family Medicine

## 2022-02-14 ENCOUNTER — Encounter: Payer: Self-pay | Admitting: Physical Therapy

## 2022-02-14 DIAGNOSIS — R7989 Other specified abnormal findings of blood chemistry: Secondary | ICD-10-CM

## 2022-02-14 LAB — THYROID PEROXIDASE ANTIBODY: Thyroperoxidase Ab SerPl-aCnc: 1 IU/mL (ref <9)

## 2022-02-15 ENCOUNTER — Encounter: Payer: Self-pay | Admitting: Family Medicine

## 2022-02-15 NOTE — Progress Notes (Signed)
Office Visit Note  Patient: [name]             Date of Birth: 1960-09-10           MRN: [id number]             PCP: Mosie Lukes, MD Referring: Mosie Lukes, MD Visit Date: 02/28/2022   Subjective:  Follow-up (Raynaud's. Has seen hand specialist and carpal tunnel diagnosis. )   History of Present Illness: [name] is a 61 y.o. female here for follow up for raynaud's symptoms on nifedipine 30 mg daily discontinue tadalafil due to seeing no clinical improvement.  Symptoms were decreased over the summertime but she is having more symptoms again with the cooler weather.  She is noticing some increased erythema around the base of the fingernails.  She also saw hand surgery over the interval and was diagnosed with carpal tunnel syndrome.  Previous HPI 06/27/2021 [name] is a 61 y.o. female here for follow up for raynaud's symptoms on nifedipine 30 mg daily and starting tadalafil 5 mg daily after last visit. She has not seen a significant improvement in symptoms since starting the medication. She also does not report any side effects. She notices some increased joint pain and cysts overlying her finger joints on both hands. She reports intermittent episodes of gross blood in urine. Workup for this without clear cause established so far. She had recent labs checked showing mild CK elevation. She had CK elevation previously including in 2016 rheum evaluation with normal inflammatory markers and negative ANA.   Previous HPI 03/30/21 [name] is a 61 y.o. female here for follow up with raynaud's symptoms and abnormal labs initial visit demonstrating increased serum IgM of 433 ALT of 43 and ESR 38. We also recommended switching back to a single calcium channel blocker due to peripheral edema increased on combination of amlodipine and nifedipine.  Now she is just taking the Procardia 30 mg daily and had resolution of the lower extremity edema.  Hand discoloration pain  and tightness continues intermittently as before.   Previous HPI 03/16/21 [name] is a 61 y.o. female here for evaluation of raynaud's symptoms and abnormal labs. She has had cold hands and feet longstanding but the development of discoloration in her fingers is newer in particular since her bariatric surgery earlier this year. She experiences triphasic color changes with cold exposure most often although sometimes without clear provocation. She denies any digital pitting, peeling, or ulceration change. She takes precautions for cold wearing gloves and wearing clothes sleeping at night to She was previously on amlodipine 5 mg PO daily as well as metoprolol for hypertension. This was recommended switching to nifedipine 30 mg PO daily after vascular surgery evaluation with findings consistent for raynaud's. She restarted the amlodipine again in combination with this since yesterday and reports noticing ankle swelling this morning. She takes modafinil 400 mg about 4 days per week for help with work concentration unchanged for about 10 years.    Labs reviewed 02/2021 ANCA ATYP P-ANCA 1:160   Review of Systems  Constitutional:  Positive for fatigue.  HENT:  Positive for mouth dryness.   Eyes:  Negative for dryness.  Respiratory:  Negative for shortness of breath.   Cardiovascular:  Negative for chest pain and palpitations.  Gastrointestinal:  Positive for constipation. Negative for blood in stool and diarrhea.  Endocrine: Negative for increased urination.  Genitourinary:  Negative for involuntary urination.  Musculoskeletal:  Positive for joint pain, joint pain, joint swelling, myalgias, muscle weakness, morning stiffness and myalgias. Negative for gait problem and muscle tenderness.  Skin:  Negative for color change, rash, hair loss and sensitivity to sunlight.  Allergic/Immunologic: Negative for susceptible to infections.  Neurological:  Positive for dizziness and headaches.   Hematological:  Negative for swollen glands.  Psychiatric/Behavioral:  Negative for depressed mood and sleep disturbance. The patient is not nervous/anxious.     PMFS History:  Patient Active Problem List   Diagnosis Date Noted   Pernio 02/28/2022   Elevated liver function tests 02/12/2022   Tinea corporis 11/07/2021   Kidney stone 11/07/2021   Hematuria 08/05/2021   Pelvic pain 08/05/2021   Vitamin B12 deficiency 05/09/2021   Diverticula of intestine 03/16/2021   Sciatica 03/16/2021   Abnormal ANCA test 03/16/2021   High risk medication use 03/16/2021   Familial hypercholesterolemia 12/31/2020   Fibromyalgia 12/20/2020   Raynaud's phenomenon 12/07/2020   Gastric bypass status for obesity 08/11/2020   Chronic idiopathic constipation 07/15/2020   Mild CAD 05/13/2020   Anal pain 04/12/2020   Dysphonia 04/12/2020   Epigastric pain 04/12/2020   Family history of malignant neoplasm of gastrointestinal tract 04/12/2020   Left lower quadrant pain 04/12/2020   Thoracic and lumbosacral neuritis 04/12/2020   Vocal fold nodules    Thyroid disease    Hypertension    GERD (gastroesophageal reflux disease)    Fibroid, uterine    Depressive disorder    Chronic rhinosinusitis    Anxiety    Hemorrhoids 02/22/2020   Statin intolerance 12/28/2019   Elevated CK 11/12/2019   Lymphadenopathy 11/12/2019   Neck pain 11/04/2019   Cervical radiculopathy 05/19/2019   Numbness and tingling 04/17/2019   Tachycardia 94/70/9628   Umbilical hernia 36/62/9476   Sun-damaged skin 01/19/2019   Preventative health care 01/19/2019   Right arm pain 01/16/2019   Carpal tunnel syndrome of right wrist 09/04/2018   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/26/2018   Bilateral hand pain 01/30/2018   Mucoid cyst, joint 01/30/2018   Nodule of finger of both hands 01/10/2018   Weakness 07/03/2017   Osteoarthritis of spine with radiculopathy, cervical region 07/24/2016   Dysuria 05/25/2016   Arthritis  05/25/2016   Obesity (BMI 30.0-34.9) 02/18/2016   Left shoulder pain 06/27/2015   Pain in joint, shoulder region 06/27/2015   Myalgia 03/27/2015   Muscle cramp 05/22/2014   Mass of soft tissue of neck 02/23/2014   AP (abdominal pain) 02/15/2014   Hoarseness of voice 05/11/2013   Backache 03/03/2013   Low back pain 03/03/2013   Hypokalemia 02/05/2013   Edema 01/06/2013   Diabetes mellitus type 2 in obese ([place]) 01/06/2013   Chronic pain syndrome 01/06/2013   Cough 01/06/2013   Perimenopause 10/05/2012   Glaucoma    SOB (shortness of breath) 09/10/2011   Constipation 02/13/2011   Vitamin D deficiency 02/13/2011   IBS (irritable bowel syndrome) 02/13/2011   RHINITIS, CHRONIC 01/14/2010   Atypical chest pain 06/16/2008   THYROMEGALY 11/11/2007   Allergic rhinitis 11/11/2007   ECZEMA, HANDS 07/22/2007   Hypothyroidism 06/06/2007   Hyperlipemia, mixed 06/06/2007   ADJ DISORDER WITH MIXED ANXIETY & DEPRESSED MOOD 06/06/2007   OSA (obstructive sleep apnea) 06/06/2007   COLONIC POLYPS, HX OF 06/06/2007    Past Medical History:  Diagnosis Date   Acute bronchitis 05/25/2016   Anxiety    Arthritis    Chronic pain syndrome 01/06/2013   Chronic rhinosinusitis    Colon polyps  Cough 01/06/2013   Depression    Diabetes ([place]) 01/06/2013   Diabetes mellitus type 2 in obese [place]) 01/06/2013   Sees Dr Calvert Cantor for eye exam Does not see podiatry, foot exam today unremarkable except for thick cracking skin on heals  pt denies    Dyspnea    with exertion    Dysuria 05/25/2016   Edema 01/06/2013   Epistaxis 05/25/2016   Fibroid, uterine    Fibromyalgia    GERD (gastroesophageal reflux disease)    Glaucoma 03/2012   Hoarseness    Hoarseness of voice 05/11/2013   Hyperglycemia 04/13/2013   Hyperlipemia, mixed 06/06/2007   Qualifier: Diagnosis of  By: Wynona Luna She feels Lipitor caused increased low back pain and weakness     Hyperlipidemia    Hypertension     Hypokalemia 02/05/2013   IBS (irritable bowel syndrome) 02/13/2011   Low back pain 03/03/2013   Muscle cramp 05/22/2014   Obesity    Obesity, unspecified 05/11/2013   OSA (obstructive sleep apnea)    cpap    Pain in joint, shoulder region 06/27/2015   Perimenopause 10/05/2012   Pre-diabetes    Raynaud disease 06/16/2013   Sleep apnea    Thyroid disease    hypothyroidism   Vocal fold nodules     Family History  Problem Relation Age of Onset   Alcohol abuse Father    Cancer Father        renal and colon   Hyperlipidemia Father    Hypertension Father    Stroke Father    Heart disease Father    Cancer Paternal Grandfather        colon   Diabetes Other    High blood pressure Mother    High Cholesterol Mother    Kidney disease Mother    Depression Mother    Anxiety disorder Mother    Obesity Mother    Hypertension Mother    Breast cancer Other 28   Diabetes Sister    Diabetes Brother    Arthritis Maternal Grandmother    Heart disease Paternal Grandmother    Past Surgical History:  Procedure Laterality Date   ABDOMINAL HYSTERECTOMY     ANAL RECTAL MANOMETRY N/A 03/22/2022   Procedure: ANO RECTAL MANOMETRY;  Surgeon: Ronnette Juniper, MD;  Location: [facility];  Service: Gastroenterology;  Laterality: N/A;   BREAST EXCISIONAL BIOPSY Right    at age 77   BREAST SURGERY  lump remove breast   age 69 yrs   CHOLECYSTECTOMY     colonoscopoy      CYSTECTOMY     DILATION AND CURETTAGE OF UTERUS     x2   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   Procedure: LAPAROSCOPIC ROUX-EN-Y GASTRIC BYPASS WITH UPPER ENDOSCOPY;  Surgeon: [name], MD;  Location: [facility];  Service: General;  Laterality: N/A;   HERNIA REPAIR  5/22   I & D EXTREMITY Left 04/11/2018   Procedure: DEBIDMENT DISTAL INTERPHALANGEAL LEFT MIDDLE;  Surgeon: Daryll Brod, MD;  Location: [place];  Service: Orthopedics;  Laterality: Left;   INCISIONAL HERNIA REPAIR  08/10/2020   Procedure: LAPAROSCOPIC  PRIMARY REPAIR OF INCISIONAL HERNIA;  Surgeon: [name], MD;  Location: [facility];  Service: General;;   MASS EXCISION Left 04/11/2018   Procedure: EXCISION MASS;  Surgeon: Daryll Brod, MD;  Location: [place];  Service: Orthopedics;  Laterality: Left;   NEPHROLITHOTOMY  10/14/2021   OOPHORECTOMY     POLYPECTOMY  ROTATOR CUFF REPAIR     UPPER GI ENDOSCOPY N/A 08/10/2020   Procedure: UPPER GI ENDOSCOPY;  Surgeon: [name], MD;  Location: [facility];  Service: General;  Laterality: N/A;   Social History   Social History Narrative   Endo-- Dr Dwyane Dee   ENT--Dr shoemaker   GI--Dr Buccini   Pulm--Dr Clance   Rheum--Dr Charlestine Night         Immunization History  Administered Date(s) Administered   Influenza Split 01/10/2021   Influenza Whole 01/23/2008, 01/28/2009, 02/16/2010   Influenza, High Dose Seasonal PF 01/06/2013, 01/10/2016, 01/10/2018, 01/16/2019, 01/02/2020   Influenza,inj,Quad PF,6+ Mos 01/06/2013, 01/10/2016, 01/10/2018, 01/16/2019, 01/02/2020   Influenza-Unspecified 01/22/2014, 02/09/2015, 01/17/2017   PFIZER Comirnaty(Gray Top)Covid-19 Tri-Sucrose Vaccine 05/05/2019, 05/26/2019   PFIZER(Purple Top)SARS-COV-2 Vaccination 05/05/2019, 05/26/2019   Pneumococcal Conjugate-13 05/22/2014   Pneumococcal Polysaccharide-23 03/22/2015   Td 12/18/2000   Tdap 12/18/2000, 02/10/2014   Zoster Recombinat (Shingrix) 01/10/2018, 05/21/2018     Objective: Vital Signs: BP 123/84 (BP Location: Left Arm, Patient Position: Sitting, Cuff Size: Normal)   Pulse 85   Resp 15   Ht 5' 4.5" (1.638 m)   Wt 186 lb (84.4 kg)   BMI 31.43 kg/m    Physical Exam Cardiovascular:     Rate and Rhythm: Regular rhythm. Tachycardia present.  Pulmonary:     Effort: Pulmonary effort is normal.     Breath sounds: Normal breath sounds.  Musculoskeletal:     Right lower leg: Edema present.     Left lower leg: Edema present.  Skin:    General: Skin is warm and dry.     Findings: No  rash.     Comments: Mild dusky discoloration of distal segment of 5th fingers b/l Normal appearing nailfold capillaroscopy bilaterally      Musculoskeletal Exam:  Elbows full ROM no tenderness or swelling Wrists full ROM no tenderness or swelling Fingers full ROM no tenderness or swelling Knees full ROM no tenderness or swelling  Investigation: No additional findings.  Imaging: No results found.  Recent Labs: Lab Results  Component Value Date   WBC 3.2 (L) 02/13/2022   HGB 14.6 02/13/2022   PLT 301.0 02/13/2022   NA 140 02/13/2022   K 4.4 02/13/2022   CL 104 02/13/2022   CO2 28 02/13/2022   GLUCOSE 86 02/13/2022   BUN 13 02/13/2022   CREATININE 0.69 02/13/2022   BILITOT 0.3 02/13/2022   ALKPHOS 94 02/13/2022   AST 46 (H) 02/13/2022   ALT 68 (H) 02/13/2022   PROT 6.8 02/13/2022   ALBUMIN 4.3 02/13/2022   CALCIUM 9.8 02/13/2022   GFRAA 79 04/27/2020    Speciality Comments: No specialty comments available.  Procedures:  No procedures performed Allergies: Bupropion, Crestor [rosuvastatin], Losartan, Atorvastatin, Simvastatin, Amoxicillin, Aspirin, Codeine, Erythromycin, and Penicillins   Assessment / Plan:     Visit Diagnoses: Raynaud's disease without gangrene - Plan: ANA, Anti-DNA antibody, double-stranded, C3 and C4, Cardiolipin antibodies, IgG, IgM, IgA, Lupus Anticoagulant Eval w/Reflex, Beta-2 glycoprotein antibodies, Sedimentation rate  No major ischemic injury changes.  Unfortunately she has not tolerated well increased dose of calcium channel blockers and reported limited benefit with addition of tadalafil.  Rechecking serologic markers for evidence of underlying autoimmune disease for secondary Raynaud's given the multiple other nonspecific complaints.  Abnormal ANCA test - Plan: ANA, Anti-DNA antibody, double-stranded, C3 and C4, Cardiolipin antibodies, IgG, IgM, IgA, Lupus Anticoagulant Eval w/Reflex, Beta-2 glycoprotein antibodies, Sedimentation  rate  Raynaud's with some possible mild inflammation at the affected digits  and previously abnormal ANCA lab tests we will recheck serology today including antiphospholipid antibodies.  Chilblains, initial encounter - Plan: triamcinolone ointment (KENALOG) 0.5 %  There is some mild erythema of the digits some of the toe but also describes this on fingertips recommend trial of topical triamcinolone question if this could be some mild pernio developing related to her Raynaud's.  Orders: Orders Placed This Encounter  Procedures   ANA   Anti-DNA antibody, double-stranded   C3 and C4   Cardiolipin antibodies, IgG, IgM, IgA   Lupus Anticoagulant Eval w/Reflex   Beta-2 glycoprotein antibodies   Sedimentation rate   Anti-nuclear ab-titer (ANA titer)   Meds ordered this encounter  Medications   triamcinolone ointment (KENALOG) 0.5 %    Sig: Apply 1 Application topically to the affected area 2 (two) times daily as needed.    Dispense:  30 g    Refill:  0     Follow-Up Instructions: Return in about 3 months (around 05/31/2022) for Raynauds/chillblains/?AID f/u 9mo.   CCollier Salina MD  Note - This record has been created using [employer]  Chart creation errors have been sought, but may not always  have been located. Such creation errors do not reflect on  the standard of medical care.

## 2022-02-17 ENCOUNTER — Other Ambulatory Visit: Payer: 59

## 2022-02-17 ENCOUNTER — Encounter: Payer: Self-pay | Admitting: Cardiology

## 2022-02-17 ENCOUNTER — Other Ambulatory Visit (HOSPITAL_COMMUNITY): Payer: Self-pay

## 2022-02-17 ENCOUNTER — Ambulatory Visit: Payer: 59 | Attending: Cardiology | Admitting: Cardiology

## 2022-02-17 VITALS — BP 120/70 | HR 81 | Ht 64.0 in | Wt 182.6 lb

## 2022-02-17 DIAGNOSIS — E669 Obesity, unspecified: Secondary | ICD-10-CM

## 2022-02-17 DIAGNOSIS — E782 Mixed hyperlipidemia: Secondary | ICD-10-CM

## 2022-02-17 DIAGNOSIS — I2510 Atherosclerotic heart disease of native coronary artery without angina pectoris: Secondary | ICD-10-CM | POA: Diagnosis not present

## 2022-02-17 DIAGNOSIS — G4733 Obstructive sleep apnea (adult) (pediatric): Secondary | ICD-10-CM | POA: Diagnosis not present

## 2022-02-17 DIAGNOSIS — I10 Essential (primary) hypertension: Secondary | ICD-10-CM | POA: Diagnosis not present

## 2022-02-17 MED ORDER — IPRATROPIUM BROMIDE 0.06 % NA SOLN
2.0000 | Freq: Three times a day (TID) | NASAL | 12 refills | Status: DC | PRN
  Filled 2022-02-17: qty 15, 14d supply, fill #0

## 2022-02-17 NOTE — Progress Notes (Signed)
Cardiology Office Note:    Date:  02/17/2022   ID:  [name], DOB 1960-12-06, MRN [id number]  PCP:  Mosie Lukes, MD  Cardiologist:  Berniece Salines, DO  Electrophysiologist:  None   Referring MD: Mosie Lukes, MD   " I feel great"  History of Present Illness:    [name] is a 61 y.o. female with a hx of hypertension, prediabetes, hyperlipidemia with statin intolerance mild coronary artery disease seen on coronary CTA is here today for follow-up visit.  I did see the patient back in March 2022 at that time she appeared to be doing well from a cardiovascular standpoint.  We had continued her Ranexa at 500 mg twice a day.  She has started her PCSK9 inhibitors and this was doing well.  She had planned bariatric surgery for April 19,2022.  She is here today for follow-up visit.   I am very happy for this patient, since her weight loss journey she has done so well.  She is happy to.  She offers no complaints at this time.  Past Medical History:  Diagnosis Date   Acute bronchitis 05/25/2016   Anxiety    Arthritis    Chronic pain syndrome 01/06/2013   Chronic rhinosinusitis    Colon polyps    Cough 01/06/2013   Depression    Diabetes ([place]) 01/06/2013   Diabetes mellitus type 2 in obese [facility]) 01/06/2013   Sees Dr Calvert Cantor for eye exam Does not see podiatry, foot exam today unremarkable except for thick cracking skin on heals  pt denies    Dyspnea    with exertion    Dysuria 05/25/2016   Edema 01/06/2013   Epistaxis 05/25/2016   Fibroid, uterine    Fibromyalgia    GERD (gastroesophageal reflux disease)    Glaucoma 03/2012   Hoarseness    Hoarseness of voice 05/11/2013   Hyperglycemia 04/13/2013   Hyperlipemia, mixed 06/06/2007   Qualifier: Diagnosis of  By: Wynona Luna She feels Lipitor caused increased low back pain and weakness     Hyperlipidemia    Hypertension    Hypokalemia 02/05/2013   IBS (irritable bowel syndrome) 02/13/2011   Low back pain  03/03/2013   Muscle cramp 05/22/2014   Obesity    Obesity, unspecified 05/11/2013   OSA (obstructive sleep apnea)    cpap    Pain in joint, shoulder region 06/27/2015   Perimenopause 10/05/2012   Pre-diabetes    Raynaud disease 06/16/2013   Sleep apnea    Thyroid disease    hypothyroidism   Vocal fold nodules     Past Surgical History:  Procedure Laterality Date   ABDOMINAL HYSTERECTOMY     BREAST EXCISIONAL BIOPSY Right    at age 32   BREAST SURGERY  lump remove breast   age 88 yrs   CHOLECYSTECTOMY     colonoscopoy      CYSTECTOMY     DILATION AND CURETTAGE OF UTERUS     x2   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   Procedure: LAPAROSCOPIC ROUX-EN-Y GASTRIC BYPASS WITH UPPER ENDOSCOPY;  Surgeon: [name], MD;  Location: [facility];  Service: General;  Laterality: N/A;   HERNIA REPAIR  5/22   I & D EXTREMITY Left 04/11/2018   Procedure: DEBIDMENT DISTAL INTERPHALANGEAL LEFT MIDDLE;  Surgeon: Daryll Brod, MD;  Location: [place];  Service: Orthopedics;  Laterality: Left;   INCISIONAL HERNIA REPAIR  08/10/2020   Procedure: LAPAROSCOPIC PRIMARY REPAIR  OF INCISIONAL HERNIA;  Surgeon: [name], MD;  Location: [facility];  Service: General;;   MASS EXCISION Left 04/11/2018   Procedure: EXCISION MASS;  Surgeon: Daryll Brod, MD;  Location: [place];  Service: Orthopedics;  Laterality: Left;   OOPHORECTOMY     POLYPECTOMY     ROTATOR CUFF REPAIR     UPPER GI ENDOSCOPY N/A 08/10/2020   Procedure: UPPER GI ENDOSCOPY;  Surgeon: [name], MD;  Location: [facility];  Service: General;  Laterality: N/A;    Current Medications: Current Meds  Medication Sig   acarbose (PRECOSE) 25 MG tablet Take 1 tablet (25 mg total) by mouth 3 (three) times daily with meals.   albuterol (VENTOLIN HFA) 108 (90 Base) MCG/ACT inhaler Inhale 2 puffs into the lungs every 4 (four) hours as needed for wheezing or shortness of breath.   bimatoprost (LUMIGAN) 0.01 % SOLN Place 1  drop into both eyes daily.   busPIRone (BUSPAR) 7.5 MG tablet Take 1 tablet (7.5 mg total) by mouth 2 (two) times daily.   calcium carbonate (OS-CAL - DOSED IN MG OF ELEMENTAL CALCIUM) 1250 (500 Ca) MG tablet    Calcium Carbonate 500 MG CHEW Chew 500 mg by mouth daily at 12 noon.   Evolocumab (REPATHA SURECLICK) 161 MG/ML SOAJ Inject 140 mg into the skin every 14 (fourteen) days.   furosemide (LASIX) 20 MG tablet Take 1 tablet (20 mg total) by mouth daily as needed.   gabapentin (NEURONTIN) 300 MG capsule Take 1-2 capsules (300-600 mg total) by mouth every evening.   ipratropium (ATROVENT) 0.06 % nasal spray Place 2 sprays into both nostrils 3 (three) times daily as needed for rhinitis   loratadine (CLARITIN) 10 MG tablet Take 20 mg by mouth daily.   lubiprostone (AMITIZA) 24 MCG capsule Take 1 capsule (24 mcg total) by mouth 2 (two) times daily with food and water   lubiprostone (AMITIZA) 24 MCG capsule Take 1 capsule (24 mcg total) by mouth 2 (two) times daily with food and water   metoprolol tartrate (LOPRESSOR) 25 MG tablet Take 0.5 tablets (12.5 mg total) by mouth 2 (two) times daily.   Milnacipran (SAVELLA) 50 MG TABS tablet Take 1 tablet (50 mg total) by mouth 2 (two) times daily.   modafinil (PROVIGIL) 200 MG tablet Take 2 tablets (400 mg total) by mouth daily.   Multiple Vitamins-Minerals (MULTIVITAMIN WITH MINERALS) tablet Take 1 tablet by mouth daily.   NIFEdipine (PROCARDIA-XL/NIFEDICAL-XL) 30 MG 24 hr tablet Take 1 tablet (30 mg total) by mouth daily.   pantoprazole (PROTONIX) 40 MG tablet Take 1 tablet (40 mg total) by mouth daily.   Prucalopride Succinate (MOTEGRITY) 2 MG TABS Take 1 tablet (2 mg total) by mouth daily.   spironolactone (ALDACTONE) 50 MG tablet Take 1 tablet (50 mg total) by mouth 3 (three) times daily.   thyroid (ARMOUR) 90 MG tablet Take 1 tablet (90 mg total) by mouth daily.   Vitamin D, Ergocalciferol, (DRISDOL) 1.25 MG (50000 UNIT) CAPS capsule Take 1 capsule  (50,000 Units total) by mouth every 7 (seven) days.     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  Tobacco Use   Smoking status: Former    Packs/day: 0.50    Years: 30.00    Total pack years: 15.00    Types: Cigarettes    Quit date: 04/25/2003    Years since quitting: 18.8   Smokeless tobacco: Never   Tobacco comments:    smoked since age 49  Vaping Use   Vaping Use: Never used  Substance and Sexual Activity   Alcohol use: No   Drug use: Never   Sexual activity: Not Currently    Partners: Male    Birth control/protection: None  Other Topics Concern   Not on file  Social History Narrative   Endo-- Dr Dwyane Dee   ENT--Dr shoemaker   GI--Dr Northbrook   Pulm--Dr Clance   Rheum--Dr Charlestine Night         Social Determinants of Health   Financial Resource Strain: Not on file  Food Insecurity: Not on file  Transportation Needs: Not on file  Physical Activity: Not on file  Stress: Not on file  Social Connections: Not on file     Family History: The patient's family history includes Alcohol abuse in her father; Anxiety disorder in her mother; Arthritis in her maternal grandmother; Breast cancer (age of onset: 65) in an other family member; Cancer in her father and paternal grandfather; Depression in her mother; Diabetes in her brother, sister, and another family member; Heart disease in her father and paternal grandmother; High Cholesterol in her mother; High blood pressure in her mother; Hyperlipidemia in her father; Hypertension in her father and mother; Kidney disease in her mother; Obesity in her mother; Stroke in her  father.  ROS:   Review of Systems  Constitution: Negative for decreased appetite, fever and weight gain.  HENT: Negative for congestion, ear discharge, hoarse voice and sore throat.   Eyes: Negative for discharge, redness, vision loss in right eye and visual halos.  Cardiovascular: Negative for chest pain, dyspnea on exertion, leg swelling, orthopnea and palpitations.  Respiratory: Negative for cough, hemoptysis, shortness of breath and snoring.   Endocrine: Negative for heat intolerance and polyphagia.  Hematologic/Lymphatic: Negative for bleeding problem. Does not bruise/bleed easily.  Skin: Negative for flushing, nail changes, rash and suspicious lesions.  Musculoskeletal: Negative for arthritis, joint pain, muscle cramps, myalgias, neck pain and stiffness.  Gastrointestinal: Negative for abdominal pain, bowel incontinence, diarrhea and excessive appetite.  Genitourinary: Negative for decreased libido, genital sores and incomplete emptying.  Neurological: Negative for brief paralysis, focal weakness, headaches and loss of balance.  Psychiatric/Behavioral: Negative for altered mental status, depression and suicidal ideas.  Allergic/Immunologic: Negative for HIV exposure and persistent infections.    EKGs/Labs/Other Studies Reviewed:    The following studies were reviewed today:   EKG: None today  Recent Labs: 11/07/2021: Magnesium 1.9 02/13/2022: ALT 68; BUN 13; Creatinine, Ser 0.69; Hemoglobin 14.6; Platelets 301.0; Potassium 4.4; Sodium 140; TSH 0.61  Recent Lipid Panel    Component Value Date/Time   CHOL 130 02/13/2022 1133   CHOL 77 (L) 10/11/2020 0816   TRIG 74.0 02/13/2022 1133   HDL 86.40 02/13/2022 1133   HDL 43 10/11/2020 0816   CHOLHDL 2 02/13/2022 1133   VLDL 14.8 02/13/2022 1133   LDLCALC 29 02/13/2022 1133   LDLCALC 17 10/11/2020 0816   LDLCALC 138 (H) 02/13/2020 0857   LDLDIRECT 159.0 11/11/2019 1125    Physical Exam:    VS:  BP 120/70   Pulse 81   Ht  '5\' 4"'$  (1.626 m)  Wt 182 lb 9.6 oz (82.8 kg)   SpO2 98%   BMI 31.34 kg/m     Wt Readings from Last 3 Encounters:  02/17/22 182 lb 9.6 oz (82.8 kg)  02/13/22 179 lb 12.8 oz (81.6 kg)  12/09/21 177 lb (80.3 kg)     GEN: Well nourished, well developed in no acute distress HEENT: Normal NECK: No JVD; No carotid bruits LYMPHATICS: No lymphadenopathy CARDIAC: S1S2 noted,RRR, no murmurs, rubs, gallops RESPIRATORY:  Clear to auscultation without rales, wheezing or rhonchi  ABDOMEN: Soft, non-tender, non-distended, +bowel sounds, no guarding. EXTREMITIES: No edema, No cyanosis, no clubbing MUSCULOSKELETAL:  No deformity  SKIN: Warm and dry NEUROLOGIC:  Alert and oriented x 3, non-focal PSYCHIATRIC:  Normal affect, good insight  ASSESSMENT:    1. Hypertension, unspecified type   2. Mild CAD   3. Hyperlipemia, mixed   4. Obesity (BMI 30.0-34.9)     PLAN:      From a cardiovascular standpoint she appears to be doing well.  She has familial hyperlipidemia we will continue patient on her current PCSK9 inhibitor.  She follows with our lipid clinic. Blood pressure is acceptable, continue with current antihypertensive regimen. Hyperlipidemia - continue with current statin medication.  The patient is in agreement with the above plan. The patient left the office in stable condition.  The patient will follow up in 1 year   Medication Adjustments/Labs and Tests Ordered: Current medicines are reviewed at length with the patient today.  Concerns regarding medicines are outlined above.  Orders Placed This Encounter  Procedures   EKG 12-Lead    No orders of the defined types were placed in this encounter.    Patient Instructions  Medication Instructions:  Your physician recommends that you continue on your current medications as directed. Please refer to the Current Medication list given to you today.  *If you need a refill on your cardiac medications before your next appointment,  please call your pharmacy*   Lab Work: NONE If you have labs (blood work) drawn today and your tests are completely normal, you will receive your results only by: [place] (if you have MyChart) OR A paper copy in the mail If you have any lab test that is abnormal or we need to change your treatment, we will call you to review the results.   Testing/Procedures: NONE   Follow-Up: At [facility], you and your health needs are our priority.  As part of our continuing mission to provide you with exceptional heart care, we have created designated Provider Care Teams.  These Care Teams include your primary Cardiologist (physician) and Advanced Practice Providers (APPs -  Physician Assistants and Nurse Practitioners) who all work together to provide you with the care you need, when you need it.  We recommend signing up for the patient portal called "MyChart".  Sign up information is provided on this After Visit Summary.  MyChart is used to connect with patients for Virtual Visits (Telemedicine).  Patients are able to view lab/test results, encounter notes, upcoming appointments, etc.  Non-urgent messages can be sent to your provider as well.   To learn more about what you can do with MyChart, go to [email or web address].    Your next appointment:   1 year(s)  The format for your next appointment:   In Person  Provider:   Berniece Salines, DO     Adopting a Healthy Lifestyle.  Know what a healthy weight is for you (roughly BMI <25) and aim  to maintain this   Aim for 7+ servings of fruits and vegetables daily   65-80+ fluid ounces of water or unsweet tea for healthy kidneys   Limit to max 1 drink of alcohol per day; avoid smoking/tobacco   Limit animal fats in diet for cholesterol and heart health - choose grass fed whenever available   Avoid highly processed foods, and foods high in saturated/trans fats   Aim for low stress - take time to unwind and care for your  mental health   Aim for 150 min of moderate intensity exercise weekly for heart health, and weights twice weekly for bone health   Aim for 7-9 hours of sleep daily   When it comes to diets, agreement about the perfect plan isnt easy to find, even among the experts. Experts at the [employer] developed an idea known as the Healthy Eating Plate. Just imagine a plate divided into logical, healthy portions.   The emphasis is on diet quality:   Load up on vegetables and fruits - one-half of your plate: Aim for color and variety, and remember that potatoes dont count.   Go for whole grains - one-quarter of your plate: Whole wheat, barley, wheat berries, quinoa, oats, brown rice, and foods made with them. If you want pasta, go with whole wheat pasta.   Protein power - one-quarter of your plate: Fish, chicken, beans, and nuts are all healthy, versatile protein sources. Limit red meat.   The diet, however, does go beyond the plate, offering a few other suggestions.   Use healthy plant oils, such as olive, canola, soy, corn, sunflower and peanut. Check the labels, and avoid partially hydrogenated oil, which have unhealthy trans fats.   If youre thirsty, drink water. Coffee and tea are good in moderation, but skip sugary drinks and limit milk and dairy products to one or two daily servings.   The type of carbohydrate in the diet is more important than the amount. Some sources of carbohydrates, such as vegetables, fruits, whole grains, and beans-are healthier than others.   Finally, stay active  Signed, Berniece Salines, DO  02/17/2022 9:45 AM    [place]

## 2022-02-17 NOTE — Patient Instructions (Signed)
Medication Instructions:  Your physician recommends that you continue on your current medications as directed. Please refer to the Current Medication list given to you today.  *If you need a refill on your cardiac medications before your next appointment, please call your pharmacy*   Lab Work: NONE If you have labs (blood work) drawn today and your tests are completely normal, you will receive your results only by: MyChart Message (if you have MyChart) OR A paper copy in the mail If you have any lab test that is abnormal or we need to change your treatment, we will call you to review the results.   Testing/Procedures: NONE   Follow-Up: At [facility], you and your health needs are our priority.  As part of our continuing mission to provide you with exceptional heart care, we have created designated Provider Care Teams.  These Care Teams include your primary Cardiologist (physician) and Advanced Practice Providers (APPs -  Physician Assistants and Nurse Practitioners) who all work together to provide you with the care you need, when you need it.  We recommend signing up for the patient portal called "MyChart".  Sign up information is provided on this After Visit Summary.  MyChart is used to connect with patients for Virtual Visits (Telemedicine).  Patients are able to view lab/test results, encounter notes, upcoming appointments, etc.  Non-urgent messages can be sent to your provider as well.   To learn more about what you can do with MyChart, go to [email or web address].    Your next appointment:   1 year(s)  The format for your next appointment:   In Person  Provider:   Kardie Tobb, DO   

## 2022-02-21 ENCOUNTER — Encounter: Payer: Self-pay | Admitting: Physical Therapy

## 2022-02-22 ENCOUNTER — Other Ambulatory Visit: Payer: 59

## 2022-02-22 DIAGNOSIS — Z6831 Body mass index (BMI) 31.0-31.9, adult: Secondary | ICD-10-CM | POA: Diagnosis not present

## 2022-02-22 DIAGNOSIS — I7300 Raynaud's syndrome without gangrene: Secondary | ICD-10-CM | POA: Diagnosis not present

## 2022-02-22 DIAGNOSIS — R7989 Other specified abnormal findings of blood chemistry: Secondary | ICD-10-CM

## 2022-02-22 DIAGNOSIS — Z9884 Bariatric surgery status: Secondary | ICD-10-CM | POA: Diagnosis not present

## 2022-02-22 DIAGNOSIS — E039 Hypothyroidism, unspecified: Secondary | ICD-10-CM | POA: Diagnosis not present

## 2022-02-22 DIAGNOSIS — Z8639 Personal history of other endocrine, nutritional and metabolic disease: Secondary | ICD-10-CM | POA: Diagnosis not present

## 2022-02-22 DIAGNOSIS — K5909 Other constipation: Secondary | ICD-10-CM | POA: Diagnosis not present

## 2022-02-22 DIAGNOSIS — E6609 Other obesity due to excess calories: Secondary | ICD-10-CM | POA: Diagnosis not present

## 2022-02-22 DIAGNOSIS — E119 Type 2 diabetes mellitus without complications: Secondary | ICD-10-CM | POA: Diagnosis not present

## 2022-02-22 DIAGNOSIS — E669 Obesity, unspecified: Secondary | ICD-10-CM | POA: Diagnosis not present

## 2022-02-22 DIAGNOSIS — R5383 Other fatigue: Secondary | ICD-10-CM | POA: Diagnosis not present

## 2022-02-28 ENCOUNTER — Other Ambulatory Visit (HOSPITAL_COMMUNITY): Payer: Self-pay

## 2022-02-28 ENCOUNTER — Ambulatory Visit: Payer: 59 | Attending: Internal Medicine | Admitting: Internal Medicine

## 2022-02-28 ENCOUNTER — Encounter: Payer: Self-pay | Admitting: Internal Medicine

## 2022-02-28 VITALS — BP 123/84 | HR 85 | Resp 15 | Ht 64.5 in | Wt 186.0 lb

## 2022-02-28 DIAGNOSIS — T691XXA Chilblains, initial encounter: Secondary | ICD-10-CM

## 2022-02-28 DIAGNOSIS — I7300 Raynaud's syndrome without gangrene: Secondary | ICD-10-CM

## 2022-02-28 DIAGNOSIS — R768 Other specified abnormal immunological findings in serum: Secondary | ICD-10-CM | POA: Diagnosis not present

## 2022-02-28 DIAGNOSIS — M797 Fibromyalgia: Secondary | ICD-10-CM | POA: Diagnosis not present

## 2022-02-28 DIAGNOSIS — Z79899 Other long term (current) drug therapy: Secondary | ICD-10-CM

## 2022-02-28 DIAGNOSIS — R2233 Localized swelling, mass and lump, upper limb, bilateral: Secondary | ICD-10-CM

## 2022-02-28 LAB — COPPER, URINE, 24 HOUR

## 2022-02-28 LAB — EXTRA SPECIMEN

## 2022-02-28 MED ORDER — TRIAMCINOLONE ACETONIDE 0.5 % EX OINT
1.0000 | TOPICAL_OINTMENT | Freq: Two times a day (BID) | CUTANEOUS | 0 refills | Status: DC | PRN
  Filled 2022-02-28: qty 30, 30d supply, fill #0

## 2022-02-28 NOTE — Patient Instructions (Addendum)
Hydroxychloroquine Tablets What is this medication? HYDROXYCHLOROQUINE (hye drox ee KLOR oh kwin) treats autoimmune conditions, such as rheumatoid arthritis and lupus. It works by slowing down an overactive immune system. It may also be used to prevent and treat malaria. It works by killing the parasite that causes malaria. It belongs to a group of medications called DMARDs. This medicine may be used for other purposes; ask your health care provider or pharmacist if you have questions. COMMON BRAND NAME(S): Plaquenil, Quineprox What should I tell my care team before I take this medication? They need to know if you have any of these conditions: Diabetes Eye disease, vision problems Frequently drink alcohol G6PD deficiency Heart disease Irregular heartbeat or rhythm Kidney disease Liver disease Porphyria Psoriasis An unusual or allergic reaction to hydroxychloroquine, other medications, foods, dyes, or preservatives Pregnant or trying to get pregnant Breastfeeding How should I use this medication? Take this medication by mouth with water. Take it as directed on the prescription label. Do not cut, crush, or chew this medication. Swallow the tablets whole. Take it with food. Do not take it more than directed. Take all of this medication unless your care team tells you to stop it early. Keep taking it even if you think you are better. Take products with antacids in them at a different time of day than this medication. Take this medication 4 hours before or 4 hours after antacids. Talk to your care team if you have questions. Talk to your care team about the use of this medication in children. While this medication may be prescribed for selected conditions, precautions do apply. Overdosage: If you think you have taken too much of this medicine contact a poison control center or emergency room at once. NOTE: This medicine is only for you. Do not share this medicine with others. What if I miss a  dose? If you miss a dose, take it as soon as you can. If it is almost time for your next dose, take only that dose. Do not take double or extra doses. What may interact with this medication? Do not take this medication with any of the following: Cisapride Dronedarone Pimozide Thioridazine This medication may also interact with the following: Ampicillin Antacids Cimetidine Cyclosporine Digoxin Kaolin Medications for diabetes, such as insulin, glipizide, glyburide Medications for seizures, such as carbamazepine, phenobarbital, phenytoin Mefloquine Methotrexate Other medications that cause heart rhythm changes Praziquantel This list may not describe all possible interactions. Give your health care provider a list of all the medicines, herbs, non-prescription drugs, or dietary supplements you use. Also tell them if you smoke, drink alcohol, or use illegal drugs. Some items may interact with your medicine. What should I watch for while using this medication? Visit your care team for regular checks on your progress. Tell your care team if your symptoms do not start to get better or if they get worse. You may need blood work done while you are taking this medication. If you take other medications that can affect heart rhythm, you may need more testing. Talk to your care team if you have questions. Your vision may be tested before and during use of this medication. Tell your care team right away if you have any change in your eyesight. This medication may cause serious skin reactions. They can happen weeks to months after starting the medication. Contact your care team right away if you notice fevers or flu-like symptoms with a rash. The rash may be red or purple and then turn   into blisters or peeling of the skin. Or, you might notice a red rash with swelling of the face, lips or lymph nodes in your neck or under your arms. If you or your family notice any changes in your behavior, such as new or  worsening depression, thoughts of harming yourself, anxiety, or other unusual or disturbing thoughts, or memory loss, call your care team right away. What side effects may I notice from receiving this medication? Side effects that you should report to your care team as soon as possible: Allergic reactions--skin rash, itching, hives, swelling of the face, lips, tongue, or throat Aplastic anemia--unusual weakness or fatigue, dizziness, headache, trouble breathing, increased bleeding or bruising Change in vision Heart rhythm changes--fast or irregular heartbeat, dizziness, feeling faint or lightheaded, chest pain, trouble breathing Infection--fever, chills, cough, or sore throat Low blood sugar (hypoglycemia)--tremors or shaking, anxiety, sweating, cold or clammy skin, confusion, dizziness, rapid heartbeat Muscle injury--unusual weakness or fatigue, muscle pain, dark yellow or brown urine, decrease in amount of urine Pain, tingling, or numbness in the hands or feet Rash, fever, and swollen lymph nodes Redness, blistering, peeling, or loosening of the skin, including inside the mouth Thoughts of suicide or self-harm, worsening mood, or feelings of depression Unusual bruising or bleeding Side effects that usually do not require medical attention (report to your care team if they continue or are bothersome): Diarrhea Headache Nausea Stomach pain Vomiting This list may not describe all possible side effects. Call your doctor for medical advice about side effects. You may report side effects to FDA at 1-800-FDA-1088. Where should I keep my medication? Keep out of the reach of children and pets. Store at room temperature up to 30 degrees C (86 degrees F). Protect from light. Get rid of any unused medication after the expiration date. To get rid of medications that are no longer needed or have expired: Take the medication to a medication take-back program. Check with your pharmacy or law enforcement  to find a location. If you cannot return the medication, check the label or package insert to see if the medication should be thrown out in the garbage or flushed down the toilet. If you are not sure, ask your care team. If it is safe to put it in the trash, empty the medication out of the container. Mix the medication with cat litter, dirt, coffee grounds, or other unwanted substance. Seal the mixture in a bag or container. Put it in the trash. NOTE: This sheet is a summary. It may not cover all possible information. If you have questions about this medicine, talk to your doctor, pharmacist, or health care provider.  2023 Elsevier/Gold Standard (2004-06-21 00:00:00)  

## 2022-03-01 ENCOUNTER — Other Ambulatory Visit: Payer: 59

## 2022-03-01 DIAGNOSIS — R7989 Other specified abnormal findings of blood chemistry: Secondary | ICD-10-CM

## 2022-03-03 ENCOUNTER — Encounter (HOSPITAL_COMMUNITY): Payer: Self-pay | Admitting: Psychiatry

## 2022-03-03 ENCOUNTER — Telehealth (INDEPENDENT_AMBULATORY_CARE_PROVIDER_SITE_OTHER): Payer: 59 | Admitting: Psychiatry

## 2022-03-03 ENCOUNTER — Other Ambulatory Visit (HOSPITAL_COMMUNITY): Payer: Self-pay

## 2022-03-03 DIAGNOSIS — M797 Fibromyalgia: Secondary | ICD-10-CM

## 2022-03-03 DIAGNOSIS — F411 Generalized anxiety disorder: Secondary | ICD-10-CM | POA: Diagnosis not present

## 2022-03-03 MED ORDER — BUSPIRONE HCL 7.5 MG PO TABS
7.5000 mg | ORAL_TABLET | Freq: Two times a day (BID) | ORAL | 1 refills | Status: DC
  Filled 2022-03-03: qty 60, 30d supply, fill #0
  Filled 2022-04-04: qty 60, 30d supply, fill #1

## 2022-03-03 NOTE — Progress Notes (Signed)
[facility] Follow up visit  Patient Identification: [name] MRN:  [id number] Date of Evaluation:  03/03/2022 Referral Source: Primary care Chief Complaint:  follow up anxiety Visit Diagnosis:    ICD-10-CM   1. Generalized anxiety disorder  F41.1     2. Fibromyalgia  M79.7     Virtual Visit via Video Note  I connected with [name] on 03/03/22 at 10:30 AM EST by a video enabled telemedicine application and verified that I am speaking with the correct person using two identifiers.  Location: Patient: home Provider: home office   I discussed the limitations of evaluation and management by telemedicine and the availability of in person appointments. The patient expressed understanding and agreed to proceed.      I discussed the assessment and treatment plan with the patient. The patient was provided an opportunity to ask questions and all were answered. The patient agreed with the plan and demonstrated an understanding of the instructions.   The patient was advised to call back or seek an in-person evaluation if the symptoms worsen or if the condition fails to improve as anticipated.  I provided 15 minutes  of non-face-to-face time during this encounter.      History of Present Illness: Patient is a 61 years old female lives with her significant other initially referred by primary care physician for establish care for anxiety.  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  Does not have any kids   Gets stress with job but has 3 more years to go wants to hang in  Overall anxiety is manageable takes buspar one or two a day   Mora Appl for fibromyalgia   She follows with Francie Massing for therapy  She uses a CPAP machine at night  Aggravating factors; job stress Modifying factors; trying to look for hobby Duration since adult life      Past Psychiatric History: anxiety  Previous Psychotropic Medications: Yes  Effexor,  xanax  Past Medical History:  Past Medical History:  Diagnosis Date   Acute bronchitis 05/25/2016   Anxiety    Arthritis    Chronic pain syndrome 01/06/2013   Chronic rhinosinusitis    Colon polyps    Cough 01/06/2013   Depression    Diabetes ([place]) 01/06/2013   Diabetes mellitus type 2 in obese ([place]) 01/06/2013   Sees Dr Calvert Cantor for eye exam Does not see podiatry, foot exam today unremarkable except for thick cracking skin on heals  pt denies    Dyspnea    with exertion    Dysuria 05/25/2016   Edema 01/06/2013   Epistaxis 05/25/2016   Fibroid, uterine    Fibromyalgia    GERD (gastroesophageal reflux disease)    Glaucoma 03/2012   Hoarseness    Hoarseness of voice 05/11/2013   Hyperglycemia 04/13/2013   Hyperlipemia, mixed 06/06/2007   Qualifier: Diagnosis of  By: Wynona Luna She feels Lipitor caused increased low back pain and weakness     Hyperlipidemia    Hypertension    Hypokalemia 02/05/2013   IBS (irritable bowel syndrome) 02/13/2011   Low back pain 03/03/2013   Muscle cramp 05/22/2014   Obesity    Obesity, unspecified 05/11/2013   OSA (obstructive sleep apnea)    cpap    Pain in joint, shoulder region 06/27/2015   Perimenopause 10/05/2012   Pre-diabetes    Raynaud disease 06/16/2013   Sleep apnea    Thyroid disease  hypothyroidism   Vocal fold nodules     Past Surgical History:  Procedure Laterality Date   ABDOMINAL HYSTERECTOMY     BREAST EXCISIONAL BIOPSY Right    at age 56   BREAST SURGERY  lump remove breast   age 55 yrs   CHOLECYSTECTOMY     colonoscopoy      CYSTECTOMY     DILATION AND CURETTAGE OF UTERUS     x2   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   Procedure: LAPAROSCOPIC ROUX-EN-Y GASTRIC BYPASS WITH UPPER ENDOSCOPY;  Surgeon: [name], MD;  Location: [facility];  Service: General;  Laterality: N/A;   HERNIA REPAIR  5/22   I & D EXTREMITY Left 04/11/2018   Procedure: DEBIDMENT DISTAL INTERPHALANGEAL LEFT MIDDLE;  Surgeon:  Daryll Brod, MD;  Location: [facility];  Service: Orthopedics;  Laterality: Left;   INCISIONAL HERNIA REPAIR  08/10/2020   Procedure: LAPAROSCOPIC PRIMARY REPAIR OF INCISIONAL HERNIA;  Surgeon: [name], MD;  Location: [facility];  Service: General;;   MASS EXCISION Left 04/11/2018   Procedure: EXCISION MASS;  Surgeon: Daryll Brod, MD;  Location: [facility];  Service: Orthopedics;  Laterality: Left;   NEPHROLITHOTOMY  10/14/2021   OOPHORECTOMY     POLYPECTOMY     ROTATOR CUFF REPAIR     UPPER GI ENDOSCOPY N/A 08/10/2020   Procedure: UPPER GI ENDOSCOPY;  Surgeon: [name], MD;  Location: [facility];  Service: General;  Laterality: N/A;    Family Psychiatric History: dad : alcohol use  Family History:  Family History  Problem Relation Age of Onset   Alcohol abuse Father    Cancer Father        renal and colon   Hyperlipidemia Father    Hypertension Father    Stroke Father    Heart disease Father    Cancer Paternal Grandfather        colon   Diabetes Other    High blood pressure Mother    High Cholesterol Mother    Kidney disease Mother    Depression Mother    Anxiety disorder Mother    Obesity Mother    Hypertension Mother    Breast cancer Other 31   Diabetes Sister    Diabetes Brother    Arthritis Maternal Grandmother    Heart disease Paternal Grandmother     Social History:   Social History   Socioeconomic History   Marital status: Significant Other    Spouse name: Not on file   Number of children: 0   Years of education: Not on file   Highest education level: Not on file  Occupational History   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  Tobacco Use   Smoking status: Former    Packs/day: 0.50    Years: 30.00    Total pack years: 15.00    Types: Cigarettes    Quit date: 04/25/2003    Years since quitting: 18.8   Smokeless tobacco: Never   Tobacco comments:    smoked since age 38  Vaping Use   Vaping Use: Never used  Substance and  Sexual Activity   Alcohol use: No   Drug use: Never   Sexual activity: Not Currently    Partners: Male    Birth control/protection: None  Other Topics Concern   Not on file  Social History Narrative   Endo-- Dr Dwyane Dee   ENT--Dr shoemaker   GI--Dr Sidney   Pulm--Dr Clance   Rheum--Dr Charlestine Night  Social Determinants of Health   Financial Resource Strain: Not on file  Food Insecurity: Not on file  Transportation Needs: Not on file  Physical Activity: Not on file  Stress: Not on file  Social Connections: Not on file      Allergies:   Allergies  Allergen Reactions   Bupropion Other (See Comments)    Dizziness and mental status changes Other reaction(s): Dizziness   Crestor [Rosuvastatin] Other (See Comments)    Myalgias and rising CPK   Losartan Cough   Atorvastatin     myalgias   Simvastatin     NDC Code:[id number] NDC BZJI:[id number] NDC PZWC:[id number] Other reaction(s): Myalgias (Muscle Pain)   Amoxicillin Rash   Aspirin Nausea Only and Other (See Comments)    GI upset REACTION: Upset stomach Other reaction(s): Vomiting   Codeine Rash   Erythromycin Rash and Other (See Comments)   Penicillins Rash    Reaction: 15 years    Metabolic Disorder Labs: Lab Results  Component Value Date   HGBA1C 6.0 02/13/2022   MPG 122.63 08/03/2020   MPG 137 02/13/2020   No results found for: "PROLACTIN" Lab Results  Component Value Date   CHOL 130 02/13/2022   TRIG 74.0 02/13/2022   HDL 86.40 02/13/2022   CHOLHDL 2 02/13/2022   VLDL 14.8 02/13/2022   LDLCALC 29 02/13/2022   LDLCALC 28 11/07/2021   Lab Results  Component Value Date   TSH 0.61 02/13/2022    Therapeutic Level Labs: No results found for: "LITHIUM" No results found for: "CBMZ" No results found for: "VALPROATE"  Current Medications: Current Outpatient Medications  Medication Sig Dispense Refill   acarbose (PRECOSE) 25 MG tablet Take 1 tablet (25 mg total) by mouth 3 (three) times  daily with meals. 90 tablet 11   albuterol (VENTOLIN HFA) 108 (90 Base) MCG/ACT inhaler Inhale 2 puffs into the lungs every 4 (four) hours as needed for wheezing or shortness of breath. 18 g 0   bimatoprost (LUMIGAN) 0.01 % SOLN Place 1 drop into both eyes daily. 2.5 mL 12   busPIRone (BUSPAR) 7.5 MG tablet Take 1 tablet (7.5 mg total) by mouth 2 (two) times daily. 60 tablet 1   calcium carbonate (OS-CAL - DOSED IN MG OF ELEMENTAL CALCIUM) 1250 (500 Ca) MG tablet      Calcium Carbonate 500 MG CHEW Chew 500 mg by mouth daily at 12 noon.     Evolocumab (REPATHA SURECLICK) 536 MG/ML SOAJ Inject 140 mg into the skin every 14 (fourteen) days. 6 mL 3   furosemide (LASIX) 20 MG tablet Take 1 tablet (20 mg total) by mouth daily as needed. 90 tablet 1   gabapentin (NEURONTIN) 300 MG capsule Take 1-2 capsules (300-600 mg total) by mouth every evening. 60 capsule 1   ipratropium (ATROVENT) 0.06 % nasal spray Place 2 sprays into both nostrils 3 (three) times daily as needed for rhinitis 15 mL 12   loratadine (CLARITIN) 10 MG tablet Take 20 mg by mouth daily.     lubiprostone (AMITIZA) 24 MCG capsule Take 1 capsule (24 mcg total) by mouth 2 (two) times daily with food and water 60 capsule 0   lubiprostone (AMITIZA) 24 MCG capsule Take 1 capsule (24 mcg total) by mouth 2 (two) times daily with food and water 60 capsule 0   metoprolol tartrate (LOPRESSOR) 25 MG tablet Take 0.5 tablets (12.5 mg total) by mouth 2 (two) times daily. 60 tablet 1   Milnacipran (SAVELLA) 50 MG TABS tablet Take 1  tablet (50 mg total) by mouth 2 (two) times daily. 60 tablet 5   modafinil (PROVIGIL) 200 MG tablet Take 2 tablets (400 mg total) by mouth daily. 60 tablet 5   Multiple Vitamins-Minerals (MULTIVITAMIN WITH MINERALS) tablet Take 1 tablet by mouth daily.     NIFEdipine (PROCARDIA-XL/NIFEDICAL-XL) 30 MG 24 hr tablet Take 1 tablet (30 mg total) by mouth daily. 30 tablet 3   pantoprazole (PROTONIX) 40 MG tablet Take 1 tablet (40 mg  total) by mouth daily. 90 tablet 0   Prucalopride Succinate (MOTEGRITY) 2 MG TABS Take 1 tablet (2 mg total) by mouth daily. (Patient not taking: Reported on 02/28/2022) 30 tablet 0   spironolactone (ALDACTONE) 50 MG tablet Take 1 tablet (50 mg total) by mouth 3 (three) times daily. 270 tablet 3   thyroid (ARMOUR) 90 MG tablet Take 1 tablet (90 mg total) by mouth daily. 90 tablet 1   triamcinolone ointment (KENALOG) 0.5 % Apply 1 Application topically to the affected area 2 (two) times daily as needed. 30 g 0   Vitamin D, Ergocalciferol, (DRISDOL) 1.25 MG (50000 UNIT) CAPS capsule Take 1 capsule (50,000 Units total) by mouth every 7 (seven) days. 12 capsule 1   No current facility-administered medications for this visit.     Psychiatric Specialty Exam: Review of Systems  Cardiovascular:  Negative for chest pain.  Psychiatric/Behavioral:  Negative for agitation and self-injury.     There were no vitals taken for this visit.There is no height or weight on file to calculate BMI.  General Appearance: Casual  Eye Contact:  Fair  Speech:  Slow  Volume:  Decreased  Mood:  Euthymic  Affect:  Constricted  Thought Process:  Goal Directed  Orientation:  Full (Time, Place, and Person)  Thought Content:  Rumination  Suicidal Thoughts:  No  Homicidal Thoughts:  No  Memory:  Immediate;   Fair Recent;   Fair  Judgement:  Fair  Insight:  Fair  Psychomotor Activity:  Decreased  Concentration:  Concentration: Fair and Attention Span: Fair  Recall:  [employer] of Knowledge:Fair  Language: Fair  Akathisia:  No  Handed:    AIMS (if indicated):  not done  Assets:  Desire for Improvement Financial Resources/Insurance  ADL's:  Intact  Cognition: WNL  Sleep:  Fair   Screenings: GAD-7    Flowsheet Row Office Visit from 03/22/2015 in [facility] at [employer]  Total GAD-7 Score 0      PHQ2-9    [place] Office Visit from 02/13/2022 in [facility] at [place] Visit from 11/07/2021 in [facility] at [facility] [place] Visit from 08/04/2021 in [facility] at [place] Visit from 09/09/2020 in [facility] at [place] from [facility]/24/2020 in [facility]  PHQ-2 Total Score 0 0 0 0 0  PHQ-9 Total Score 0 0 0 0 --      Flowsheet Row Video Visit from 08/29/2021 in [place] Video Visit from 03/07/2021 in [place] Video Visit from 11/29/2020 in [facility] [place] No Risk No Risk No Risk       Assessment and Plan:  As follows Prior documentation reviewed   GAD: job stress can cause anxiety, grief related to brothers death 25 years ago when there are triggers Provided supportive  therapy, continue buspar one or two a day Fibromyalgia. Manageable on savella  Fu 6 m, reviewed meds   Merian Capron, MD 11/10/202310:35 AM

## 2022-03-05 ENCOUNTER — Other Ambulatory Visit: Payer: Self-pay | Admitting: Family Medicine

## 2022-03-05 LAB — LUPUS ANTICOAGULANT EVAL W/ REFLEX
PTT-LA Screen: 33 s (ref <=40)
dRVVT: 32 s (ref <=45)

## 2022-03-05 LAB — BETA-2 GLYCOPROTEIN ANTIBODIES
Beta-2 Glyco 1 IgA: <2 U/mL (ref <20.0)
Beta-2 Glyco 1 IgM: 2.4 U/mL (ref <20.0)
Beta-2 Glyco I IgG: <2 U/mL (ref <20.0)

## 2022-03-05 LAB — ANTI-DNA ANTIBODY, DOUBLE-STRANDED: ds DNA Ab: <1 IU/mL

## 2022-03-05 LAB — ANTI-NUCLEAR AB-TITER (ANA TITER): ANA Titer 1: 1:80 {titer} — ABNORMAL HIGH

## 2022-03-05 LAB — ANA: Anti Nuclear Antibody (ANA): POSITIVE — AB

## 2022-03-05 LAB — CARDIOLIPIN ANTIBODIES, IGG, IGM, IGA
Anticardiolipin IgA: <2 APL-U/mL (ref <20.0)
Anticardiolipin IgG: <2 GPL-U/mL (ref <20.0)
Anticardiolipin IgM: 2.1 MPL-U/mL (ref <20.0)

## 2022-03-05 LAB — C3 AND C4
C3 Complement: 117 mg/dL (ref 83–193)
C4 Complement: 27 mg/dL (ref 15–57)

## 2022-03-05 LAB — SEDIMENTATION RATE: Sed Rate: 17 mm/h (ref 0–30)

## 2022-03-06 ENCOUNTER — Other Ambulatory Visit (HOSPITAL_COMMUNITY): Payer: Self-pay

## 2022-03-06 ENCOUNTER — Other Ambulatory Visit: Payer: 59

## 2022-03-06 MED ORDER — METOPROLOL TARTRATE 25 MG PO TABS
12.5000 mg | ORAL_TABLET | Freq: Two times a day (BID) | ORAL | 1 refills | Status: DC
  Filled 2022-03-06 – 2022-04-04 (×2): qty 90, 90d supply, fill #0
  Filled 2022-07-02: qty 90, 90d supply, fill #1

## 2022-03-07 ENCOUNTER — Ambulatory Visit: Payer: 59 | Admitting: Internal Medicine

## 2022-03-08 ENCOUNTER — Other Ambulatory Visit: Payer: 59

## 2022-03-08 DIAGNOSIS — R7989 Other specified abnormal findings of blood chemistry: Secondary | ICD-10-CM | POA: Diagnosis not present

## 2022-03-09 LAB — COPPER, URINE, 24 HOUR
Copper,Urine (24 Hr): 6 mcg/24 h — ABNORMAL LOW (ref 15–60)
Total Volume: 900 mL

## 2022-03-13 ENCOUNTER — Other Ambulatory Visit (HOSPITAL_COMMUNITY): Payer: Self-pay

## 2022-03-15 ENCOUNTER — Other Ambulatory Visit (HOSPITAL_COMMUNITY): Payer: Self-pay

## 2022-03-15 ENCOUNTER — Other Ambulatory Visit: Payer: Self-pay

## 2022-03-15 ENCOUNTER — Encounter: Payer: Self-pay | Admitting: Family Medicine

## 2022-03-15 ENCOUNTER — Other Ambulatory Visit: Payer: Self-pay | Admitting: Family Medicine

## 2022-03-15 DIAGNOSIS — R7989 Other specified abnormal findings of blood chemistry: Secondary | ICD-10-CM

## 2022-03-15 MED ORDER — PANTOPRAZOLE SODIUM 40 MG PO TBEC
40.0000 mg | DELAYED_RELEASE_TABLET | Freq: Every day | ORAL | 0 refills | Status: DC
  Filled 2022-03-15: qty 90, 90d supply, fill #0

## 2022-03-15 NOTE — Addendum Note (Signed)
Addended by: Laure Kidney on: 03/15/2022 01:39 PM   Modules accepted: Orders

## 2022-03-21 ENCOUNTER — Encounter: Payer: Self-pay | Admitting: Internal Medicine

## 2022-03-21 ENCOUNTER — Other Ambulatory Visit (HOSPITAL_COMMUNITY): Payer: Self-pay

## 2022-03-22 ENCOUNTER — Ambulatory Visit (HOSPITAL_COMMUNITY)
Admission: RE | Admit: 2022-03-22 | Discharge: 2022-03-22 | Disposition: A | Discharge status: Home / Self Care | Payer: 59 | Attending: Gastroenterology | Admitting: Gastroenterology

## 2022-03-22 ENCOUNTER — Encounter (HOSPITAL_COMMUNITY): Payer: Self-pay | Admitting: Gastroenterology

## 2022-03-22 ENCOUNTER — Encounter (HOSPITAL_COMMUNITY)
Admission: RE | Disposition: A | Discharge status: Home / Self Care | Payer: Self-pay | Source: Home / Self Care | Attending: Gastroenterology

## 2022-03-22 DIAGNOSIS — J310 Chronic rhinitis: Secondary | ICD-10-CM | POA: Insufficient documentation

## 2022-03-22 DIAGNOSIS — F4323 Adjustment disorder with mixed anxiety and depressed mood: Secondary | ICD-10-CM | POA: Insufficient documentation

## 2022-03-22 DIAGNOSIS — E039 Hypothyroidism, unspecified: Secondary | ICD-10-CM | POA: Insufficient documentation

## 2022-03-22 DIAGNOSIS — G4733 Obstructive sleep apnea (adult) (pediatric): Secondary | ICD-10-CM | POA: Insufficient documentation

## 2022-03-22 DIAGNOSIS — E049 Nontoxic goiter, unspecified: Secondary | ICD-10-CM | POA: Insufficient documentation

## 2022-03-22 DIAGNOSIS — E782 Mixed hyperlipidemia: Secondary | ICD-10-CM | POA: Insufficient documentation

## 2022-03-22 HISTORY — PX: ANAL RECTAL MANOMETRY: SHX6358

## 2022-03-22 SURGERY — MANOMETRY, ANORECTAL

## 2022-03-22 NOTE — Progress Notes (Signed)
Anal rectal manometry performed per protocol without complications.  Patient tolerated well.  Balloon expulsion test performed per protocol without complications.  Patient tolerated well.  Unable to expel balloon after 3 minutes

## 2022-03-24 ENCOUNTER — Encounter (HOSPITAL_COMMUNITY): Payer: Self-pay | Admitting: Gastroenterology

## 2022-03-26 ENCOUNTER — Other Ambulatory Visit: Payer: Self-pay | Admitting: Family Medicine

## 2022-03-27 ENCOUNTER — Other Ambulatory Visit (HOSPITAL_COMMUNITY): Payer: Self-pay

## 2022-03-27 MED ORDER — SAVELLA 50 MG PO TABS
50.0000 mg | ORAL_TABLET | Freq: Two times a day (BID) | ORAL | 5 refills | Status: DC
  Filled 2022-03-27: qty 60, 30d supply, fill #0
  Filled 2022-04-20: qty 60, 30d supply, fill #1
  Filled 2022-05-24: qty 60, 30d supply, fill #2
  Filled 2022-06-25 – 2022-07-24 (×2): qty 60, 30d supply, fill #3
  Filled 2022-08-20: qty 60, 30d supply, fill #4
  Filled 2022-09-18: qty 60, 30d supply, fill #5

## 2022-03-29 DIAGNOSIS — K5909 Other constipation: Secondary | ICD-10-CM | POA: Diagnosis not present

## 2022-04-04 ENCOUNTER — Other Ambulatory Visit (HOSPITAL_COMMUNITY): Payer: Self-pay

## 2022-04-04 ENCOUNTER — Other Ambulatory Visit: Payer: Self-pay

## 2022-04-05 ENCOUNTER — Other Ambulatory Visit (HOSPITAL_COMMUNITY): Payer: Self-pay

## 2022-04-05 MED ORDER — GABAPENTIN 300 MG PO CAPS
300.0000 mg | ORAL_CAPSULE | Freq: Every morning | ORAL | 1 refills | Status: DC
  Filled 2022-04-05: qty 60, 30d supply, fill #0
  Filled 2022-09-10: qty 60, 30d supply, fill #1

## 2022-04-11 NOTE — Therapy (Addendum)
OUTPATIENT PHYSICAL THERAPY FEMALE PELVIC EVALUATION   Patient Name: [name] MRN: [id number] DOB:Jul 13, 1960, 61 y.o., female Today's Date: 04/12/2022  END OF SESSION:  PT End of Session - 04/12/22 1449     Visit Number 1    Date for PT Re-Evaluation 07/05/22    PT Start Time 1445    PT Stop Time 1525    PT Time Calculation (min) 40 min    Activity Tolerance Patient tolerated treatment well    Behavior During Therapy [facility] for tasks assessed/performed             Past Medical History:  Diagnosis Date   Acute bronchitis 05/25/2016   Anxiety    Arthritis    Chronic pain syndrome 01/06/2013   Chronic rhinosinusitis    Colon polyps    Cough 01/06/2013   Depression    Diabetes ([place]) 01/06/2013   Diabetes mellitus type 2 in obese ([place]) 01/06/2013   Sees Dr Calvert Cantor for eye exam Does not see podiatry, foot exam today unremarkable except for thick cracking skin on heals  pt denies    Dyspnea    with exertion    Dysuria 05/25/2016   Edema 01/06/2013   Epistaxis 05/25/2016   Fibroid, uterine    Fibromyalgia    GERD (gastroesophageal reflux disease)    Glaucoma 03/2012   Hoarseness    Hoarseness of voice 05/11/2013   Hyperglycemia 04/13/2013   Hyperlipemia, mixed 06/06/2007   Qualifier: Diagnosis of  By: Wynona Luna She feels Lipitor caused increased low back pain and weakness     Hyperlipidemia    Hypertension    Hypokalemia 02/05/2013   IBS (irritable bowel syndrome) 02/13/2011   Low back pain 03/03/2013   Muscle cramp 05/22/2014   Obesity    Obesity, unspecified 05/11/2013   OSA (obstructive sleep apnea)    cpap    Pain in joint, shoulder region 06/27/2015   Perimenopause 10/05/2012   Pre-diabetes    Raynaud disease 06/16/2013   Sleep apnea    Thyroid disease    hypothyroidism   Vocal fold nodules    Past Surgical History:  Procedure Laterality Date   ABDOMINAL HYSTERECTOMY     ANAL RECTAL MANOMETRY N/A 03/22/2022   Procedure: ANO  RECTAL MANOMETRY;  Surgeon: Ronnette Juniper, MD;  Location: [facility];  Service: Gastroenterology;  Laterality: N/A;   BREAST EXCISIONAL BIOPSY Right    at age 54   BREAST SURGERY  lump remove breast   age 65 yrs   CHOLECYSTECTOMY     colonoscopoy      CYSTECTOMY     DILATION AND CURETTAGE OF UTERUS     x2   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   Procedure: LAPAROSCOPIC ROUX-EN-Y GASTRIC BYPASS WITH UPPER ENDOSCOPY;  Surgeon: [name], MD;  Location: [facility];  Service: General;  Laterality: N/A;   HERNIA REPAIR  5/22   I & D EXTREMITY Left 04/11/2018   Procedure: DEBIDMENT DISTAL INTERPHALANGEAL LEFT MIDDLE;  Surgeon: Daryll Brod, MD;  Location: [place];  Service: Orthopedics;  Laterality: Left;   INCISIONAL HERNIA REPAIR  08/10/2020   Procedure: LAPAROSCOPIC PRIMARY REPAIR OF INCISIONAL HERNIA;  Surgeon: [name], MD;  Location: [facility];  Service: General;;   MASS EXCISION Left 04/11/2018   Procedure: EXCISION MASS;  Surgeon: Daryll Brod, MD;  Location: [place];  Service: Orthopedics;  Laterality: Left;   NEPHROLITHOTOMY  10/14/2021   OOPHORECTOMY     POLYPECTOMY  ROTATOR CUFF REPAIR     UPPER GI ENDOSCOPY N/A 08/10/2020   Procedure: UPPER GI ENDOSCOPY;  Surgeon: [name], MD;  Location: [facility];  Service: General;  Laterality: N/A;   Patient Active Problem List   Diagnosis Date Noted   Pernio 02/28/2022   Elevated liver function tests 02/12/2022   Tinea corporis 11/07/2021   Kidney stone 11/07/2021   Hematuria 08/05/2021   Pelvic pain 08/05/2021   Vitamin B12 deficiency 05/09/2021   Diverticula of intestine 03/16/2021   Sciatica 03/16/2021   Abnormal ANCA test 03/16/2021   High risk medication use 03/16/2021   Familial hypercholesterolemia 12/31/2020   Fibromyalgia 12/20/2020   Raynaud's phenomenon 12/07/2020   Gastric bypass status for obesity 08/11/2020   Chronic idiopathic constipation 07/15/2020   Mild CAD 05/13/2020   Anal  pain 04/12/2020   Dysphonia 04/12/2020   Epigastric pain 04/12/2020   Family history of malignant neoplasm of gastrointestinal tract 04/12/2020   Left lower quadrant pain 04/12/2020   Thoracic and lumbosacral neuritis 04/12/2020   Vocal fold nodules    Thyroid disease    Hypertension    GERD (gastroesophageal reflux disease)    Fibroid, uterine    Depressive disorder    Chronic rhinosinusitis    Anxiety    Hemorrhoids 02/22/2020   Statin intolerance 12/28/2019   Elevated CK 11/12/2019   Lymphadenopathy 11/12/2019   Neck pain 11/04/2019   Cervical radiculopathy 05/19/2019   Numbness and tingling 04/17/2019   Tachycardia Q000111Q   Umbilical hernia [id number]   Sun-damaged skin 01/19/2019   Preventative health care 01/19/2019   Right arm pain 01/16/2019   Carpal tunnel syndrome of right wrist 09/04/2018   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/26/2018   Bilateral hand pain 01/30/2018   Mucoid cyst, joint 01/30/2018   Nodule of finger of both hands 01/10/2018   Weakness 07/03/2017   Osteoarthritis of spine with radiculopathy, cervical region 07/24/2016   Dysuria 05/25/2016   Arthritis 05/25/2016   Obesity (BMI 30.0-34.9) 02/18/2016   Left shoulder pain 06/27/2015   Pain in joint, shoulder region 06/27/2015   Myalgia 03/27/2015   Muscle cramp 05/22/2014   Mass of soft tissue of neck 02/23/2014   AP (abdominal pain) 02/15/2014   Hoarseness of voice 05/11/2013   Backache 03/03/2013   Low back pain 03/03/2013   Hypokalemia 02/05/2013   Edema 01/06/2013   Diabetes mellitus type 2 in obese ([place]) 01/06/2013   Chronic pain syndrome 01/06/2013   Cough 01/06/2013   Perimenopause 10/05/2012   Glaucoma    SOB (shortness of breath) 09/10/2011   Constipation 02/13/2011   Vitamin D deficiency 02/13/2011   IBS (irritable bowel syndrome) 02/13/2011   RHINITIS, CHRONIC 01/14/2010   Atypical chest pain 06/16/2008   THYROMEGALY 11/11/2007   Allergic rhinitis 11/11/2007    ECZEMA, HANDS 07/22/2007   Hypothyroidism 06/06/2007   Hyperlipemia, mixed 06/06/2007   ADJ DISORDER WITH MIXED ANXIETY & DEPRESSED MOOD 06/06/2007   OSA (obstructive sleep apnea) 06/06/2007   COLONIC POLYPS, HX OF 06/06/2007    PCP: Mosie Lukes, MD  REFERRING PROVIDER: Garnette Scheuermann, PA-C   REFERRING DIAG: M62.89 (ICD-10-CM) - Other specified disorders of muscle   THERAPY DIAG:  Cramp and spasm  Muscle weakness (generalized)  Unspecified lack of coordination  Rationale for Evaluation and Treatment:   ONSET DATE: 2022  SUBJECTIVE:  SUBJECTIVE STATEMENT: Patient has pelvic pain and bowel movement. Vaginal valium did not help.    PAIN:  Are you having pain? Yes NPRS scale: 7-9/10 Pain location:  pelvis  Pain type: aching Pain description: intermittent   Aggravating factors: comes on random Relieving factors: resting and heat  PRECAUTIONS: None  WEIGHT BEARING RESTRICTIONS: No  FALLS:  Has patient fallen in last 6 months? No  LIVING ENVIRONMENT: Lives with: lives alone  OCCUPATION: watch heart monitors, exercise at the gym  PLOF: Independent  PATIENT GOALS: get the stool out  PERTINENT HISTORY:  Abdominal hysterectomy; Gastric Roux-en-y 2022; Hernia repair; chronic pain syndrome; Diabetes; fibromyalgia; glaucoma; OSA; Hypothyroidism; IBS    BOWEL MOVEMENT: Pain with bowel movement: No; has trouble feeling the amount of stool that is coming out, cramp with bowel movement Type of bowel movement:Type (Bristol Stool Scale) Type 1-6, Frequency 3 days and uses enemas to get the stool out, and Strain Yes Fully empty rectum: No Leakage: No Fiber supplement: Yes: miralax  URINATION: Pain with urination: No Fully empty bladder: Yes:   Stream: Strong Urgency:  No Frequency: average Leakage:  none   OBJECTIVE:   DIAGNOSTIC FINDINGS:  Anal Manometry shows pelvic decreased sphincter function with failure to relax with defecation. Decreased resting tone wit decreased length of anal sphincter. Weak squeeze pressure.    COGNITION: Overall cognitive status: Within functional limits for tasks assessed     POSTURE: No Significant postural limitations  PELVIC ALIGNMENT:  LUMBARAROM/PROM:  A/PROM A/PROM  eval  Extension Decreased by 25%  Right lateral flexion Decreased by 25%  Left lateral flexion Decreased by 25%  Right rotation Decreased by 25%  Left rotation Decreased by 25%   (Blank rows = not tested)  LOWER EXTREMITY ROM: bilateral hip ROM is full  LOWER EXTREMITY MMT:  MMT Right eval Left eval  Hip extension 4/5 4/5  Hip abduction 4/5 4/5   PALPATION:   General  tenderness located in the lower abdomen especially on the left lower quadrant; patient needs tactile cues to contract her abdomen correctly. Difficulty expanding her ribs.                 External Perineal Exam tightness along the perineal body.                              Internal Pelvic Floor tightness in the anterior portion of the rectum, decreased movement of the anococcygeal ligament  Patient confirms identification and approves PT to assess internal pelvic floor and treatment Yes  PELVIC MMT:   MMT eval  Internal Anal Sphincter 2/5  External Anal Sphincter 2/5  Puborectalis 2/5  Needs tactile cues to not contract the gluteal. She is not able to push the therapist finger out of the rectum.         TONE: increased    TODAY'S TREATMENT:  DATE: 04/12/22  EVAL finished eval   PATIENT EDUCATION:  Education details: correct breathing while toileting Person educated: Patient Education method: Holiday representative Education comprehension: verbalized understanding, returned demonstration, verbal cues required, tactile cues required, and needs further education  HOME EXERCISE PROGRAM: See above.   ASSESSMENT:  CLINICAL IMPRESSION: Patient is a 61 y.o. female who was seen today for physical therapy evaluation and treatment for other specified disorders of muscle. Patient is having trouble with having bowel movements and pelvic pain. Pelvic pain is intermittent at level 7-9/10. Her anal manometry showed  shows pelvic decreased sphincter function with failure to relax with defecation. Decreased resting tone wit decreased length of anal sphincter. Weak squeeze pressure. Patient has decreased by 25%. Tenderness located in the lower abdomen especially on the left lower quadrant.  Patient needs tactile cues to contract her abdomen correctly. She has difficulty expanding her ribs. Patient has tightness in the anterior portion of the rectum and  decreased movement of the anococcygeal ligament. Pelvic floor strength is 2/5 and not able to push therapist finger of the rectum. Patient will benefit  from skilled therapy to work on coordination of the pelvic floor to push stool out  without straining.   OBJECTIVE IMPAIRMENTS: decreased activity tolerance, decreased coordination, decreased endurance, decreased strength, increased fascial restrictions, increased muscle spasms, impaired tone, and pain.   ACTIVITY LIMITATIONS: toileting  PARTICIPATION LIMITATIONS: community activity  PERSONAL FACTORS: 1-2 comorbidities: Abdominal hysterectomy; Gastric Roux-en-y 2022; Hernia repair; chronic pain syndrome; Diabetes; fibromyalgia; glaucoma; OSA; Hypothyroidism; IBS   are also affecting patient's functional outcome.   REHAB POTENTIAL: Good  CLINICAL DECISION MAKING: Stable/uncomplicated  EVALUATION COMPLEXITY: Low   GOALS: Goals reviewed with patient? Yes  SHORT TERM GOALS: Target date: 05/10/2022  Patient  able to perform diaphragmatic breathing  to relax her pelvic floor.  Baseline: Goal status: INITIAL  2.  Patient is able to bulge her pelvic floor to initiate a bowel movement.  Baseline:  Goal status: INITIAL   LONG TERM GOALS: Target date: 07/05/2022  Patient is independent with advanced HEP for core and pelvic floor strength.  Baseline:  Goal status: INITIAL  2.  Patient is able to perform correct breathing to push the therapist finger out of the rectum to reduce straining with bowel movements.  Baseline:  Goal status: INITIAL  3.  Patient is able to contract the pelvic floor for 40 seconds to improve sensation with bowel movements.  Baseline:  Goal status: INITIAL  4.  Patient does not have to use enemas to have a bowel movements due to improved coordination of the pelvic floor.  Baseline:  Goal status: INITIAL  5.  Patient reports her pelvic pain decreased >/= 3/10 50% of the time due to reduction of trigger points.  Baseline:  Goal status: INITIAL   PLAN:  PT FREQUENCY: 1x/week  PT DURATION: 12 weeks  PLANNED INTERVENTIONS: Therapeutic exercises, Therapeutic activity, Neuromuscular re-education, Patient/Family education, Joint mobilization, Dry Needling, Electrical stimulation, Cryotherapy, Moist heat, Taping, Ultrasound, Biofeedback, and Manual therapy  PLAN FOR NEXT SESSION: abdominal work, manual work around the anus and perineal body, diaphragmatic breathing, opening of the lower rib cage.    Earlie Counts, PT 04/12/22 5:01 PM   PHYSICAL THERAPY DISCHARGE SUMMARY  Visits from Start of Care: 1  Current functional level related to goals / functional outcomes: See above. Patient called on 06/26/22 and wanted to be discharged. She has only attended the initial evaluation.    Remaining deficits: See above.  Education / Equipment: HEP   Patient agrees to discharge. Patient goals were not met. Patient is being discharged due to the patient's request. Thank  you for the referral. Earlie Counts, PT 06/26/22 10:11 AM

## 2022-04-12 ENCOUNTER — Ambulatory Visit: Payer: 59 | Attending: Gastroenterology | Admitting: Physical Therapy

## 2022-04-12 ENCOUNTER — Encounter: Payer: Self-pay | Admitting: Physical Therapy

## 2022-04-12 DIAGNOSIS — R279 Unspecified lack of coordination: Secondary | ICD-10-CM | POA: Insufficient documentation

## 2022-04-12 DIAGNOSIS — M6281 Muscle weakness (generalized): Secondary | ICD-10-CM | POA: Insufficient documentation

## 2022-04-12 DIAGNOSIS — R252 Cramp and spasm: Secondary | ICD-10-CM | POA: Insufficient documentation

## 2022-04-12 DIAGNOSIS — H40013 Open angle with borderline findings, low risk, bilateral: Secondary | ICD-10-CM | POA: Diagnosis not present

## 2022-04-18 ENCOUNTER — Other Ambulatory Visit (HOSPITAL_COMMUNITY): Payer: Self-pay

## 2022-04-18 ENCOUNTER — Other Ambulatory Visit: Payer: 59

## 2022-04-18 MED ORDER — CHLORHEXIDINE GLUCONATE 0.12 % MT SOLN
Freq: Two times a day (BID) | OROMUCOSAL | 0 refills | Status: DC
  Filled 2022-04-18: qty 473, 16d supply, fill #0

## 2022-04-20 ENCOUNTER — Other Ambulatory Visit (HOSPITAL_COMMUNITY): Payer: Self-pay

## 2022-04-20 ENCOUNTER — Other Ambulatory Visit: Payer: 59

## 2022-04-20 DIAGNOSIS — R7989 Other specified abnormal findings of blood chemistry: Secondary | ICD-10-CM | POA: Diagnosis not present

## 2022-04-21 ENCOUNTER — Encounter: Payer: Self-pay | Admitting: Family Medicine

## 2022-04-25 LAB — EXTRA SPECIMEN

## 2022-04-25 LAB — COPPER, URINE, 24 HOUR
Copper,Urine (24 Hr): 13 mcg/24 h — ABNORMAL LOW (ref 15–60)
Total Volume: 2507 mL

## 2022-04-27 ENCOUNTER — Encounter: Payer: Self-pay | Admitting: Family Medicine

## 2022-05-01 ENCOUNTER — Other Ambulatory Visit (HOSPITAL_COMMUNITY): Payer: Self-pay

## 2022-05-02 ENCOUNTER — Other Ambulatory Visit (HOSPITAL_COMMUNITY): Payer: Self-pay

## 2022-05-12 DIAGNOSIS — E039 Hypothyroidism, unspecified: Secondary | ICD-10-CM | POA: Diagnosis not present

## 2022-05-12 DIAGNOSIS — R7309 Other abnormal glucose: Secondary | ICD-10-CM | POA: Diagnosis not present

## 2022-05-15 ENCOUNTER — Other Ambulatory Visit (HOSPITAL_COMMUNITY): Payer: Self-pay

## 2022-05-15 ENCOUNTER — Other Ambulatory Visit (HOSPITAL_COMMUNITY): Payer: Self-pay | Admitting: Psychiatry

## 2022-05-15 MED ORDER — BUSPIRONE HCL 7.5 MG PO TABS
7.5000 mg | ORAL_TABLET | Freq: Two times a day (BID) | ORAL | 1 refills | Status: DC
  Filled 2022-05-15: qty 60, 30d supply, fill #0
  Filled 2022-06-13: qty 60, 30d supply, fill #1

## 2022-05-16 ENCOUNTER — Other Ambulatory Visit (HOSPITAL_COMMUNITY): Payer: Self-pay

## 2022-05-16 ENCOUNTER — Encounter: Payer: Commercial Managed Care - PPO | Admitting: Physical Therapy

## 2022-05-22 ENCOUNTER — Ambulatory Visit: Payer: 59 | Admitting: Family Medicine

## 2022-05-23 ENCOUNTER — Other Ambulatory Visit (HOSPITAL_COMMUNITY): Payer: Self-pay

## 2022-05-23 ENCOUNTER — Encounter: Payer: Self-pay | Admitting: Obstetrics and Gynecology

## 2022-05-23 DIAGNOSIS — E039 Hypothyroidism, unspecified: Secondary | ICD-10-CM | POA: Diagnosis not present

## 2022-05-23 DIAGNOSIS — K912 Postsurgical malabsorption, not elsewhere classified: Secondary | ICD-10-CM | POA: Diagnosis not present

## 2022-05-23 DIAGNOSIS — E042 Nontoxic multinodular goiter: Secondary | ICD-10-CM | POA: Diagnosis not present

## 2022-05-23 MED ORDER — ACARBOSE 50 MG PO TABS
50.0000 mg | ORAL_TABLET | Freq: Three times a day (TID) | ORAL | 11 refills | Status: DC
  Filled 2022-05-23: qty 90, 30d supply, fill #0
  Filled 2022-06-25: qty 90, 30d supply, fill #1
  Filled 2022-08-20: qty 90, 30d supply, fill #2
  Filled 2022-09-18: qty 90, 30d supply, fill #3
  Filled 2022-10-22: qty 90, 30d supply, fill #4
  Filled 2022-11-19: qty 90, 30d supply, fill #5
  Filled 2022-12-19: qty 90, 30d supply, fill #6
  Filled 2023-01-22 (×2): qty 90, 30d supply, fill #7
  Filled 2023-02-24: qty 90, 30d supply, fill #8
  Filled 2023-03-26: qty 90, 30d supply, fill #9
  Filled 2023-04-25: qty 90, 30d supply, fill #10

## 2022-05-24 ENCOUNTER — Other Ambulatory Visit (HOSPITAL_COMMUNITY): Payer: Self-pay

## 2022-05-25 ENCOUNTER — Other Ambulatory Visit (HOSPITAL_COMMUNITY): Payer: Self-pay

## 2022-05-30 ENCOUNTER — Ambulatory Visit: Payer: 59 | Admitting: Internal Medicine

## 2022-05-30 ENCOUNTER — Encounter: Payer: Commercial Managed Care - PPO | Admitting: Physical Therapy

## 2022-06-12 ENCOUNTER — Encounter: Payer: Self-pay | Admitting: *Deleted

## 2022-06-13 ENCOUNTER — Ambulatory Visit: Payer: Commercial Managed Care - PPO | Admitting: Family Medicine

## 2022-06-13 ENCOUNTER — Other Ambulatory Visit: Payer: Self-pay | Admitting: Family Medicine

## 2022-06-13 ENCOUNTER — Other Ambulatory Visit: Payer: Self-pay

## 2022-06-13 ENCOUNTER — Encounter: Payer: Commercial Managed Care - PPO | Admitting: Physical Therapy

## 2022-06-13 ENCOUNTER — Other Ambulatory Visit (HOSPITAL_COMMUNITY): Payer: Self-pay

## 2022-06-13 VITALS — BP 126/80 | HR 78 | Temp 97.5°F | Resp 16 | Ht 64.0 in | Wt 189.0 lb

## 2022-06-13 DIAGNOSIS — E669 Obesity, unspecified: Secondary | ICD-10-CM

## 2022-06-13 DIAGNOSIS — E559 Vitamin D deficiency, unspecified: Secondary | ICD-10-CM

## 2022-06-13 DIAGNOSIS — I10 Essential (primary) hypertension: Secondary | ICD-10-CM | POA: Diagnosis not present

## 2022-06-13 DIAGNOSIS — E162 Hypoglycemia, unspecified: Secondary | ICD-10-CM

## 2022-06-13 DIAGNOSIS — E7801 Familial hypercholesterolemia: Secondary | ICD-10-CM | POA: Diagnosis not present

## 2022-06-13 DIAGNOSIS — M6289 Other specified disorders of muscle: Secondary | ICD-10-CM

## 2022-06-13 DIAGNOSIS — E538 Deficiency of other specified B group vitamins: Secondary | ICD-10-CM | POA: Diagnosis not present

## 2022-06-13 DIAGNOSIS — E1169 Type 2 diabetes mellitus with other specified complication: Secondary | ICD-10-CM

## 2022-06-13 DIAGNOSIS — K5900 Constipation, unspecified: Secondary | ICD-10-CM | POA: Diagnosis not present

## 2022-06-13 DIAGNOSIS — R748 Abnormal levels of other serum enzymes: Secondary | ICD-10-CM

## 2022-06-13 DIAGNOSIS — R252 Cramp and spasm: Secondary | ICD-10-CM

## 2022-06-13 DIAGNOSIS — E78019 Familial hypercholesterolemia, unspecified: Secondary | ICD-10-CM

## 2022-06-13 DIAGNOSIS — E039 Hypothyroidism, unspecified: Secondary | ICD-10-CM

## 2022-06-13 DIAGNOSIS — R159 Full incontinence of feces: Secondary | ICD-10-CM

## 2022-06-13 MED ORDER — THYROID 90 MG PO TABS
90.0000 mg | ORAL_TABLET | Freq: Every day | ORAL | 1 refills | Status: DC
  Filled 2022-06-13: qty 90, 90d supply, fill #0
  Filled 2022-09-11: qty 90, 90d supply, fill #1

## 2022-06-13 MED ORDER — PANTOPRAZOLE SODIUM 40 MG PO TBEC
40.0000 mg | DELAYED_RELEASE_TABLET | Freq: Every day | ORAL | 0 refills | Status: DC
  Filled 2022-06-13: qty 90, 90d supply, fill #0

## 2022-06-13 NOTE — Progress Notes (Signed)
Subjective:   By signing my name below, I, Kellie Simmering, attest that this documentation has been prepared under the direction and in the presence of Mosie Lukes, MD., 06/13/2022.   Patient ID: [name], female    DOB: 1960/04/28, 62 y.o.   MRN: [id number]  Chief Complaint  Patient presents with   Follow-up    Follow up   HPI Patient is in today for an office visit. She denies CP/palpitations/SOB/HA/congestion/ fever/chills/GI or GU symptoms.  Diabetes Mellitus Type 2 Patient continues to follow with endocrinologist Dr. Honor Junes to manage her type 2 DM and currently takes Acarbose 50 mg tid. She has recently been experiencing significantly low blood glucose readings at night and has been attempting to manage this through diet changes. She is requesting a referral to "My Diabetes Dietician". Lab Results  Component Value Date   HGBA1C 6.0 02/13/2022   Pelvic Floor Dysfunction  Patient had an anal manometry performed on 03/22/2022 by her urologist Dr. Raenette Rover which revealed decreased sphincter function with failure to relax with defecation stimulation. She is requesting a referral to GI due to this and has had  four sessions of pelvic floor PT so far.  Past Medical History:  Diagnosis Date   Acute bronchitis 05/25/2016   Anxiety    Arthritis    Chronic pain syndrome 01/06/2013   Chronic rhinosinusitis    Colon polyps    Cough 01/06/2013   Depression    Diabetes ([place]) 01/06/2013   Diabetes mellitus type 2 in obese [facility]) 01/06/2013   Sees Dr Calvert Cantor for eye exam Does not see podiatry, foot exam today unremarkable except for thick cracking skin on heals  pt denies    Dyspnea    with exertion    Dysuria 05/25/2016   Edema 01/06/2013   Epistaxis 05/25/2016   Fibroid, uterine    Fibromyalgia    GERD (gastroesophageal reflux disease)    Glaucoma 03/2012   Hoarseness    Hoarseness of voice 05/11/2013   Hyperglycemia 04/13/2013   Hyperlipemia, mixed 06/06/2007    Qualifier: Diagnosis of  By: Wynona Luna She feels Lipitor caused increased low back pain and weakness     Hyperlipidemia    Hypertension    Hypokalemia 02/05/2013   IBS (irritable bowel syndrome) 02/13/2011   Low back pain 03/03/2013   Muscle cramp 05/22/2014   Obesity    Obesity, unspecified 05/11/2013   OSA (obstructive sleep apnea)    cpap    Pain in joint, shoulder region 06/27/2015   Perimenopause 10/05/2012   Pre-diabetes    Raynaud disease 06/16/2013   Sleep apnea    Thyroid disease    hypothyroidism   Vocal fold nodules     Past Surgical History:  Procedure Laterality Date   ABDOMINAL HYSTERECTOMY     ANAL RECTAL MANOMETRY N/A 03/22/2022   Procedure: ANO RECTAL MANOMETRY;  Surgeon: Ronnette Juniper, MD;  Location: [facility];  Service: Gastroenterology;  Laterality: N/A;   BREAST EXCISIONAL BIOPSY Right    at age 63   BREAST SURGERY  lump remove breast   age 23 yrs   CHOLECYSTECTOMY     colonoscopoy      CYSTECTOMY     DILATION AND CURETTAGE OF UTERUS     x2   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   Procedure: LAPAROSCOPIC ROUX-EN-Y GASTRIC BYPASS WITH UPPER ENDOSCOPY;  Surgeon: [name], MD;  Location: [facility];  Service: General;  Laterality: N/A;   HERNIA REPAIR  5/22  I & D EXTREMITY Left 04/11/2018   Procedure: DEBIDMENT DISTAL INTERPHALANGEAL LEFT MIDDLE;  Surgeon: Daryll Brod, MD;  Location: [facility];  Service: Orthopedics;  Laterality: Left;   INCISIONAL HERNIA REPAIR  08/10/2020   Procedure: LAPAROSCOPIC PRIMARY REPAIR OF INCISIONAL HERNIA;  Surgeon: [name], MD;  Location: [facility];  Service: General;;   MASS EXCISION Left 04/11/2018   Procedure: EXCISION MASS;  Surgeon: Daryll Brod, MD;  Location: [facility];  Service: Orthopedics;  Laterality: Left;   NEPHROLITHOTOMY  10/14/2021   OOPHORECTOMY     POLYPECTOMY     ROTATOR CUFF REPAIR     UPPER GI ENDOSCOPY N/A 08/10/2020   Procedure: UPPER GI ENDOSCOPY;   Surgeon: [name], MD;  Location: [facility];  Service: General;  Laterality: N/A;    Family History  Problem Relation Age of Onset   Alcohol abuse Father    Cancer Father        renal and colon   Hyperlipidemia Father    Hypertension Father    Stroke Father    Heart disease Father    Cancer Paternal Grandfather        colon   Diabetes Other    High blood pressure Mother    High Cholesterol Mother    Kidney disease Mother    Depression Mother    Anxiety disorder Mother    Obesity Mother    Hypertension Mother    Breast cancer Other 20   Diabetes Sister    Diabetes Brother    Arthritis Maternal Grandmother    Heart disease Paternal Grandmother     Social History   Socioeconomic History   Marital status: Significant Other    Spouse name: Not on file   Number of children: 0   Years of education: Not on file   Highest education level: Not on file  Occupational History   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  Tobacco Use   Smoking status: Former    Packs/day: 0.50    Years: 30.00    Total pack years: 15.00    Types: Cigarettes    Quit date: 04/25/2003    Years since quitting: 19.1   Smokeless tobacco: Never   Tobacco comments:    smoked since age 46  Vaping Use   Vaping Use: Never used  Substance and Sexual Activity   Alcohol use: No   Drug use: Never   Sexual activity: Not Currently    Partners: Male    Birth control/protection: None  Other Topics Concern   Not on file  Social History Narrative   Endo-- Dr Dwyane Dee   ENT--Dr shoemaker   GI--Dr [place]   Pulm--Dr Clance   Rheum--Dr Charlestine Night         Social Determinants of Health   Financial Resource Strain: Not on file  Food Insecurity: Not on file  Transportation Needs: Not on file  Physical Activity: Not on file  Stress: Not on file  Social Connections: Not on file  Intimate Partner Violence: Not on file    Outpatient Medications Prior to Visit  Medication Sig Dispense Refill   acarbose (PRECOSE)  50 MG tablet Take 1 tablet (50 mg total) by mouth 3 (three) times daily with meals. 90 tablet 11   albuterol (VENTOLIN HFA) 108 (90 Base) MCG/ACT inhaler Inhale 2 puffs into the lungs every 4 (four) hours as needed for wheezing or shortness of breath. 18 g 0   bimatoprost (LUMIGAN) 0.01 % SOLN Place 1 drop  into both eyes daily. 2.5 mL 12   busPIRone (BUSPAR) 7.5 MG tablet Take 1 tablet (7.5 mg total) by mouth 2 (two) times daily. 60 tablet 1   calcium carbonate (OS-CAL - DOSED IN MG OF ELEMENTAL CALCIUM) 1250 (500 Ca) MG tablet      Calcium Carbonate 500 MG CHEW Chew 500 mg by mouth daily at 12 noon.     Evolocumab (REPATHA SURECLICK) [id number] MG/ML SOAJ Inject 140 mg into the skin every 14 (fourteen) days. 6 mL 3   furosemide (LASIX) 20 MG tablet Take 1 tablet (20 mg total) by mouth daily as needed. 90 tablet 1   gabapentin (NEURONTIN) 300 MG capsule Take 1-2 capsules (300-600 mg total) by mouth every evening. 60 capsule 1   ipratropium (ATROVENT) 0.06 % nasal spray Place 2 sprays into both nostrils 3 (three) times daily as needed for rhinitis 15 mL 12   loratadine (CLARITIN) 10 MG tablet Take 20 mg by mouth daily.     metoprolol tartrate (LOPRESSOR) 25 MG tablet Take 0.5 tablets (12.5 mg total) by mouth 2 (two) times daily. 90 tablet 1   Milnacipran (SAVELLA) 50 MG TABS tablet Take 1 tablet (50 mg total) by mouth 2 (two) times daily. 60 tablet 5   modafinil (PROVIGIL) 200 MG tablet Take 2 tablets (400 mg total) by mouth daily. 60 tablet 5   Multiple Vitamins-Minerals (MULTIVITAMIN WITH MINERALS) tablet Take 1 tablet by mouth daily.     NIFEdipine (PROCARDIA-XL/NIFEDICAL-XL) 30 MG 24 hr tablet Take 1 tablet (30 mg total) by mouth daily. 30 tablet 3   Prucalopride Succinate (MOTEGRITY) 2 MG TABS Take 1 tablet (2 mg total) by mouth daily. 30 tablet 0   spironolactone (ALDACTONE) 50 MG tablet Take 1 tablet (50 mg total) by mouth 3 (three) times daily. 270 tablet 3   triamcinolone ointment (KENALOG) 0.5 %  Apply 1 Application topically to the affected area 2 (two) times daily as needed. 30 g 0   Vitamin D, Ergocalciferol, (DRISDOL) 1.25 MG (50000 UNIT) CAPS capsule Take 1 capsule (50,000 Units total) by mouth every 7 (seven) days. 12 capsule 1   pantoprazole (PROTONIX) 40 MG tablet Take 1 tablet (40 mg total) by mouth daily. 90 tablet 0   thyroid (ARMOUR) 90 MG tablet Take 1 tablet (90 mg total) by mouth daily. 90 tablet 1   acarbose (PRECOSE) 25 MG tablet Take 1 tablet (25 mg total) by mouth 3 (three) times daily with meals. 90 tablet 11   chlorhexidine (PERIDEX) 0.12 % solution Rinse twice a day in the morning and before bed bed. 473 mL 0   lubiprostone (AMITIZA) 24 MCG capsule Take 1 capsule (24 mcg total) by mouth 2 (two) times daily with food and water 60 capsule 0   lubiprostone (AMITIZA) 24 MCG capsule Take 1 capsule (24 mcg total) by mouth 2 (two) times daily with food and water 60 capsule 0   No facility-administered medications prior to visit.    Allergies  Allergen Reactions   Bupropion Other (See Comments)    Dizziness and mental status changes Other reaction(s): Dizziness   Crestor [Rosuvastatin] Other (See Comments)    Myalgias and rising CPK   Losartan Cough   Atorvastatin     myalgias   Simvastatin     NDC Code:[id number] NDC Code:[id number] NDC [id number] Other reaction(s): Myalgias (Muscle Pain)   Amoxicillin Rash   Aspirin Nausea Only and Other (See Comments)    GI upset REACTION: Upset stomach Other reaction(s): Vomiting  Codeine Rash   Erythromycin Rash and Other (See Comments)   Penicillins Rash    Reaction: 15 years    Review of Systems  Constitutional:  Negative for chills and fever.  HENT:  Negative for congestion.   Respiratory:  Negative for shortness of breath.   Cardiovascular:  Negative for chest pain and palpitations.  Gastrointestinal:  Negative for abdominal pain, blood in stool, constipation, diarrhea, nausea and vomiting.   Genitourinary:  Negative for dysuria, frequency, hematuria and urgency.  Skin:           Neurological:  Negative for headaches.       Objective:    Physical Exam Constitutional:      General: She is not in acute distress.    Appearance: Normal appearance. She is normal weight. She is not ill-appearing.  HENT:     Head: Normocephalic and atraumatic.     Right Ear: External ear normal.     Left Ear: External ear normal.     Nose: Nose normal.     Mouth/Throat:     Mouth: Mucous membranes are moist.     Pharynx: Oropharynx is clear.  Eyes:     General:        Right eye: No discharge.        Left eye: No discharge.     Extraocular Movements: Extraocular movements intact.     Conjunctiva/sclera: Conjunctivae normal.     Pupils: Pupils are equal, round, and reactive to light.  Cardiovascular:     Rate and Rhythm: Normal rate and regular rhythm.     Pulses: Normal pulses.     Heart sounds: Normal heart sounds. No murmur heard.    No gallop.  Pulmonary:     Effort: Pulmonary effort is normal. No respiratory distress.     Breath sounds: Normal breath sounds. No wheezing or rales.  Abdominal:     General: Bowel sounds are normal.     Palpations: Abdomen is soft.     Tenderness: There is no abdominal tenderness. There is no guarding.  Musculoskeletal:        General: Normal range of motion.     Cervical back: Normal range of motion.     Right lower leg: No edema.     Left lower leg: No edema.  Skin:    General: Skin is warm and dry.  Neurological:     Mental Status: She is alert and oriented to person, place, and time.  Psychiatric:        Mood and Affect: Mood normal.        Behavior: Behavior normal.        Judgment: Judgment normal.     BP 126/80 (BP Location: Right Arm, Patient Position: Sitting, Cuff Size: Normal)   Pulse 78   Temp (!) 97.5 F (36.4 C) (Oral)   Resp 16   Ht '5\' 4"'$  (1.626 m)   Wt 189 lb (85.7 kg)   SpO2 100%   BMI 32.44 kg/m  Wt Readings  from Last 3 Encounters:  06/13/22 189 lb (85.7 kg)  02/28/22 186 lb (84.4 kg)  02/17/22 182 lb 9.6 oz (82.8 kg)    Diabetic Foot Exam - Simple   No data filed    Lab Results  Component Value Date   WBC 3.2 (L) 02/13/2022   HGB 14.6 02/13/2022   HCT 43.5 02/13/2022   PLT 301.0 02/13/2022   GLUCOSE 86 02/13/2022   CHOL 130 02/13/2022   TRIG 74.0  02/13/2022   HDL 86.40 02/13/2022   LDLDIRECT 159.0 11/11/2019   LDLCALC 29 02/13/2022   ALT 68 (H) 02/13/2022   AST 46 (H) 02/13/2022   NA 140 02/13/2022   K 4.4 02/13/2022   CL 104 02/13/2022   CREATININE 0.69 02/13/2022   BUN 13 02/13/2022   CO2 28 02/13/2022   TSH 0.61 02/13/2022   HGBA1C 6.0 02/13/2022   MICROALBUR 0.8 05/09/2021    Lab Results  Component Value Date   TSH 0.61 02/13/2022   Lab Results  Component Value Date   WBC 3.2 (L) 02/13/2022   HGB 14.6 02/13/2022   HCT 43.5 02/13/2022   MCV 91.7 02/13/2022   PLT 301.0 02/13/2022   Lab Results  Component Value Date   NA 140 02/13/2022   K 4.4 02/13/2022   CO2 28 02/13/2022   GLUCOSE 86 02/13/2022   BUN 13 02/13/2022   CREATININE 0.69 02/13/2022   BILITOT 0.3 02/13/2022   ALKPHOS 94 02/13/2022   AST 46 (H) 02/13/2022   ALT 68 (H) 02/13/2022   PROT 6.8 02/13/2022   ALBUMIN 4.3 02/13/2022   CALCIUM 9.8 02/13/2022   ANIONGAP 7 08/16/2020   EGFR 58 (L) 06/22/2020   GFR 93.81 02/13/2022   Lab Results  Component Value Date   CHOL 130 02/13/2022   Lab Results  Component Value Date   HDL 86.40 02/13/2022   Lab Results  Component Value Date   LDLCALC 29 02/13/2022   Lab Results  Component Value Date   TRIG 74.0 02/13/2022   Lab Results  Component Value Date   CHOLHDL 2 02/13/2022   Lab Results  Component Value Date   HGBA1C 6.0 02/13/2022      Assessment & Plan:  Immunizations: Reviewed patient's immunization history and encouraged RSV immunization. Tetanus due in 2025 unless injured.  Labs: Blood work ordered at [facility]  office.  Referrals: Referrals placed to diabetes nutritionist and gastroenterology.  Problem List Items Addressed This Visit     Constipation   Relevant Orders   Ambulatory referral to Gastroenterology   Copper metabolism disorder    24 hour urinary excretion had improved with last check will continue to monitor      Relevant Orders   Copper, urine, 24 hour   Ceruloplasmin   Diabetes mellitus type 2 in obese (HCC)    hgba1c acceptable, minimize simple carbs. Increase exercise as tolerated. Continue current meds       Elevated CK - Primary   Relevant Orders   CK (Creatine Kinase)   Familial hypercholesterolemia   Relevant Orders   Lipid panel   Hypertension    Well controlled, no changes to meds. Encouraged heart healthy diet such as the DASH diet and exercise as tolerated.        Hypoglycemia    Doing better with dietary changes and on Acarbose 50 mg tid      Relevant Orders   Amb Referral to Nutrition and Diabetic Education   Hypothyroidism    On NP Thyroid and being followed by endocrinology      Muscle cramp    Hydrate and monitor       Pelvic floor dysfunction    With fecal incontinence and she is referred to gastroenterology for further evaluation. She has just finished her fourth session of pelvic floor PT      Relevant Orders   Ambulatory referral to Gastroenterology   Vitamin B12 deficiency    Supplement and monitor  Vitamin D deficiency    Supplement and monitor       Other Visit Diagnoses     Incontinence of feces, unspecified fecal incontinence type       Relevant Orders   Ambulatory referral to Gastroenterology      No orders of the defined types were placed in this encounter.  I, Penni Homans, MD, personally preformed the services described in this documentation.  All medical record entries made by the scribe were at my direction and in my presence.  I have reviewed the chart and discharge instructions (if applicable) and agree  that the record reflects my personal performance and is accurate and complete. 06/13/2022  I,Mohammed Iqbal,acting as a scribe for Penni Homans, MD.,have documented all relevant documentation on the behalf of Penni Homans, MD,as directed by  Penni Homans, MD while in the presence of Penni Homans, MD.  Penni Homans, MD

## 2022-06-13 NOTE — Patient Instructions (Signed)

## 2022-06-18 DIAGNOSIS — M6289 Other specified disorders of muscle: Secondary | ICD-10-CM | POA: Insufficient documentation

## 2022-06-18 DIAGNOSIS — E162 Hypoglycemia, unspecified: Secondary | ICD-10-CM | POA: Insufficient documentation

## 2022-06-18 NOTE — Assessment & Plan Note (Signed)
24 hour urinary excretion had improved with last check will continue to monitor

## 2022-06-18 NOTE — Assessment & Plan Note (Signed)
Doing better with dietary changes and on Acarbose 50 mg tid

## 2022-06-18 NOTE — Assessment & Plan Note (Signed)
With fecal incontinence and she is referred to gastroenterology for further evaluation. She has just finished her fourth session of pelvic floor PT

## 2022-06-18 NOTE — Assessment & Plan Note (Signed)
Hydrate and monitor 

## 2022-06-18 NOTE — Assessment & Plan Note (Signed)
Supplement and monitor 

## 2022-06-18 NOTE — Assessment & Plan Note (Addendum)
On NP Thyroid and being followed by endocrinology

## 2022-06-18 NOTE — Assessment & Plan Note (Signed)
hgba1c acceptable, minimize simple carbs. Increase exercise as tolerated. Continue current meds 

## 2022-06-18 NOTE — Assessment & Plan Note (Signed)
Well controlled, no changes to meds. Encouraged heart healthy diet such as the DASH diet and exercise as tolerated.  °

## 2022-06-19 ENCOUNTER — Other Ambulatory Visit (INDEPENDENT_AMBULATORY_CARE_PROVIDER_SITE_OTHER): Payer: Commercial Managed Care - PPO

## 2022-06-19 DIAGNOSIS — R748 Abnormal levels of other serum enzymes: Secondary | ICD-10-CM | POA: Diagnosis not present

## 2022-06-19 DIAGNOSIS — E7801 Familial hypercholesterolemia: Secondary | ICD-10-CM | POA: Diagnosis not present

## 2022-06-19 LAB — LIPID PANEL
Cholesterol: 124 mg/dL (ref 0–200)
HDL: 68.9 mg/dL (ref >39.00)
LDL Cholesterol: 33 mg/dL (ref 0–99)
NonHDL: 55.08
Total CHOL/HDL Ratio: 2
Triglycerides: 110 mg/dL (ref 0.0–149.0)
VLDL: 22 mg/dL (ref 0.0–40.0)

## 2022-06-19 LAB — CK: Total CK: 171 U/L (ref 7–177)

## 2022-06-20 LAB — CERULOPLASMIN: Ceruloplasmin: 30 mg/dL (ref 18–53)

## 2022-06-21 ENCOUNTER — Other Ambulatory Visit: Payer: Commercial Managed Care - PPO

## 2022-06-21 DIAGNOSIS — Z01419 Encounter for gynecological examination (general) (routine) without abnormal findings: Secondary | ICD-10-CM | POA: Diagnosis not present

## 2022-06-21 DIAGNOSIS — Z6832 Body mass index (BMI) 32.0-32.9, adult: Secondary | ICD-10-CM | POA: Diagnosis not present

## 2022-06-22 ENCOUNTER — Telehealth: Payer: Self-pay | Admitting: Gastroenterology

## 2022-06-22 NOTE — Telephone Encounter (Signed)
Good morning  Dr. Bryan Lemma,   Supervising Provider 06/22/22 AM  We received a referral for constipation, pelvic floor dysfunction and incontinence of feces. Patient does have GI history with [facility] and [facility]. Patient had colonoscopy and endoscopy with [facility] in 2018, and with [facility] in 2015. Records for [facility] are in Epic for your review. Records for [facility] are being sent up. Please advise on scheduling.   Thank you.

## 2022-06-25 LAB — COPPER, URINE, 24 HOUR
Copper,Urine (24 Hr): 9 mcg/24 h — ABNORMAL LOW (ref 15–60)
Total Volume: 1010 mL

## 2022-06-26 ENCOUNTER — Other Ambulatory Visit (HOSPITAL_COMMUNITY): Payer: Self-pay

## 2022-06-26 NOTE — Telephone Encounter (Signed)
Called and spoke with patient, advised of your recommendations.

## 2022-06-27 ENCOUNTER — Encounter: Payer: Self-pay | Admitting: Family Medicine

## 2022-06-27 ENCOUNTER — Encounter: Payer: Commercial Managed Care - PPO | Admitting: Physical Therapy

## 2022-06-28 ENCOUNTER — Other Ambulatory Visit (HOSPITAL_COMMUNITY): Payer: Self-pay

## 2022-06-28 ENCOUNTER — Other Ambulatory Visit: Payer: Self-pay | Admitting: Family Medicine

## 2022-06-28 MED ORDER — VITAMIN D (ERGOCALCIFEROL) 1.25 MG (50000 UNIT) PO CAPS
50000.0000 [IU] | ORAL_CAPSULE | ORAL | 1 refills | Status: DC
  Filled 2022-06-28: qty 12, 84d supply, fill #0
  Filled 2022-09-25: qty 12, 84d supply, fill #1

## 2022-06-30 ENCOUNTER — Other Ambulatory Visit (HOSPITAL_COMMUNITY): Payer: Self-pay

## 2022-07-05 ENCOUNTER — Other Ambulatory Visit (HOSPITAL_COMMUNITY): Payer: Self-pay

## 2022-07-07 ENCOUNTER — Other Ambulatory Visit (HOSPITAL_COMMUNITY): Payer: Self-pay

## 2022-07-11 ENCOUNTER — Encounter: Payer: Commercial Managed Care - PPO | Admitting: Physical Therapy

## 2022-07-14 ENCOUNTER — Other Ambulatory Visit (HOSPITAL_COMMUNITY): Payer: Self-pay

## 2022-07-20 ENCOUNTER — Other Ambulatory Visit: Payer: Self-pay

## 2022-07-20 ENCOUNTER — Other Ambulatory Visit (HOSPITAL_COMMUNITY): Payer: Self-pay

## 2022-07-20 ENCOUNTER — Other Ambulatory Visit: Payer: Self-pay | Admitting: Internal Medicine

## 2022-07-20 DIAGNOSIS — E782 Mixed hyperlipidemia: Secondary | ICD-10-CM

## 2022-07-20 DIAGNOSIS — I2510 Atherosclerotic heart disease of native coronary artery without angina pectoris: Secondary | ICD-10-CM

## 2022-07-20 MED ORDER — REPATHA SURECLICK 140 MG/ML ~~LOC~~ SOAJ
140.0000 mg | SUBCUTANEOUS | 1 refills | Status: DC
  Filled 2022-07-20: qty 6, 84d supply, fill #0
  Filled 2022-10-11: qty 6, 84d supply, fill #1

## 2022-07-21 ENCOUNTER — Other Ambulatory Visit (HOSPITAL_COMMUNITY): Payer: Self-pay

## 2022-07-24 ENCOUNTER — Other Ambulatory Visit (HOSPITAL_COMMUNITY): Payer: Self-pay

## 2022-07-25 ENCOUNTER — Encounter: Payer: Commercial Managed Care - PPO | Admitting: Physical Therapy

## 2022-07-28 NOTE — Progress Notes (Unsigned)
Rubin PayorI, Kayla Collins, PhD, LAT, ATC acting as a scribe for Gina GrahamEvan Arshawn Valdez, MD.  [name] is a 62 y.o. female who [facility] today for right shoulder pain.  Patient was previously seen back in 2022 for LBP and left leg weakness.  Today, patient reports right shoulder pain ongoing for about 3 weeks. She is R-hand dominate. She notes a hx of R CTS. Patient locates pain to the superior aspect of her R shoulder.   Neck pain: neck Aggravates: shoulder flexion, ER, using the steering wheel Treatments tried: arm sleeve, heat, muscle cream  Dx imaging: 11/07/2021 C-spine x-ray  Pertinent review of systems: No fevers or chills  Relevant historical information: Hypertension Diabetes  Exam:  BP (!) 148/96   Pulse 74   Ht 5\' 4"  (1.626 m)   Wt 185 lb (83.9 kg)   SpO2 100%   BMI 31.76 kg/m  General: Well Developed, well nourished, and in no acute distress.   MSK: Right shoulder: Normal-appearing Tender palpation AC joint. Normal motion pain with abduction. Intact strength. Positive Hawkins and Neer's test.  Positive crossover arm compression test. Negative Yergason's and speeds test.    Lab and Radiology Results  Procedure: Real-time Ultrasound Guided Injection of right shoulder AC joint Device: Philips Affiniti 50G Images permanently stored and available for review in PACS Verbal informed consent obtained.  Discussed risks and benefits of procedure. Warned about infection, bleeding, hyperglycemia damage to structures among others. Patient expresses understanding and agreement Time-out conducted.   Noted no overlying erythema, induration, or other signs of local infection.   Skin prepped in a sterile fashion.   Local anesthesia: Topical Ethyl chloride.   With sterile technique and under real time ultrasound guidance: 40 mg of Kenalog and 1 mL of Marcaine  injected into AC joint. Fluid seen entering the joint capsule.   Completed  without difficulty   Pain moderately  resolved suggesting accurate placement of the medication.   Advised to call if fevers/chills, erythema, induration, drainage, or persistent bleeding.   Images permanently stored and available for review in the ultrasound unit.  Impression: Technically successful ultrasound guided injection.    X-ray images right shoulder obtained today personally and independently interpreted Mild AC DJD.  No acute fractures are visible. Await formal radiology     Assessment and Plan: 62 y.o. female with right shoulder pain.  Pain is thought to be multifactorial.  However she is having a lot of AC generated pain and benefited moderately to significantly from an [facility] joint injection.  She is doing home exercise programs that she is previously learned from a left shoulder rotator cuff tendinitis episode.  Plan to do the injection as above continue home exercise program and check back in about a month. Recommend against high-dose oral NSAIDs given diabetes.  PDMP not reviewed this encounter. Orders Placed This Encounter  Procedures   DG Shoulder Right    Standing Status:   Future    Number of Occurrences:   1    Standing Expiration Date:   07/31/2023    Order Specific Question:   Reason for Exam (SYMPTOM  OR DIAGNOSIS REQUIRED)    Answer:   right shoulder pain    Order Specific Question:   Preferred imaging location?    Answer:   [facility]   US LIMITED JOINT SPACE STRUCTURES UP RIGHT(NO LINKED CHARGES)    Order Specific Question:   Reason for Exam (SYMPTOM  OR DIAGNOSIS  REQUIRED)    Answer:   right shoulder pain    Order Specific Question:   Preferred imaging location?    Answer:   [facility]   No orders of the defined types were placed in this encounter.    Discussed warning signs or symptoms. Please see discharge instructions. Patient expresses understanding.   The above documentation has been reviewed and is accurate and  complete [name], M.D.

## 2022-07-31 ENCOUNTER — Other Ambulatory Visit: Payer: Self-pay

## 2022-07-31 ENCOUNTER — Ambulatory Visit: Payer: Commercial Managed Care - PPO | Admitting: Family Medicine

## 2022-07-31 ENCOUNTER — Ambulatory Visit (INDEPENDENT_AMBULATORY_CARE_PROVIDER_SITE_OTHER): Payer: Commercial Managed Care - PPO

## 2022-07-31 VITALS — BP 148/96 | HR 74 | Ht 64.0 in | Wt 185.0 lb

## 2022-07-31 DIAGNOSIS — G8929 Other chronic pain: Secondary | ICD-10-CM

## 2022-07-31 DIAGNOSIS — M25511 Pain in right shoulder: Secondary | ICD-10-CM | POA: Diagnosis not present

## 2022-07-31 NOTE — Patient Instructions (Addendum)
Thank you for coming in today.   You received an injection today. Seek immediate medical attention if the joint becomes red, extremely painful, or is oozing fluid.   Please get an Xray today before you leave   Check back in 1 month 

## 2022-08-01 NOTE — Progress Notes (Signed)
Right shoulder x-ray shows some mild arthritis changes.

## 2022-08-11 ENCOUNTER — Other Ambulatory Visit (HOSPITAL_COMMUNITY): Payer: Self-pay

## 2022-08-11 ENCOUNTER — Other Ambulatory Visit: Payer: Self-pay | Admitting: Family Medicine

## 2022-08-11 MED ORDER — MODAFINIL 200 MG PO TABS
400.0000 mg | ORAL_TABLET | Freq: Every day | ORAL | 5 refills | Status: DC
  Filled 2022-08-11: qty 60, 30d supply, fill #0
  Filled 2022-09-04 – 2022-09-10 (×2): qty 60, 30d supply, fill #1
  Filled 2022-10-08: qty 60, 30d supply, fill #2
  Filled 2022-12-04: qty 60, 30d supply, fill #3

## 2022-08-17 ENCOUNTER — Telehealth: Payer: Self-pay | Admitting: Internal Medicine

## 2022-08-17 NOTE — Telephone Encounter (Signed)
Patient is calling wanting to know if she needs to come in for a lipid panel prior to her 05/01 appt with [facility]. She is requesting a mychart message be sent on if she does or not due to being at work. Please advise.

## 2022-08-18 NOTE — Telephone Encounter (Signed)
Labs done Feb 2024. No additional labs needed for 1 year lipid appt

## 2022-08-21 ENCOUNTER — Other Ambulatory Visit (HOSPITAL_COMMUNITY): Payer: Self-pay

## 2022-08-21 ENCOUNTER — Other Ambulatory Visit: Payer: Self-pay

## 2022-08-23 ENCOUNTER — Other Ambulatory Visit: Payer: Self-pay

## 2022-08-23 ENCOUNTER — Ambulatory Visit: Payer: Commercial Managed Care - PPO | Attending: Internal Medicine | Admitting: Internal Medicine

## 2022-08-23 ENCOUNTER — Encounter: Payer: Self-pay | Admitting: Internal Medicine

## 2022-08-23 VITALS — BP 128/80 | HR 79 | Ht 64.0 in | Wt 182.8 lb

## 2022-08-23 DIAGNOSIS — T466X5D Adverse effect of antihyperlipidemic and antiarteriosclerotic drugs, subsequent encounter: Secondary | ICD-10-CM

## 2022-08-23 DIAGNOSIS — R748 Abnormal levels of other serum enzymes: Secondary | ICD-10-CM

## 2022-08-23 DIAGNOSIS — E785 Hyperlipidemia, unspecified: Secondary | ICD-10-CM

## 2022-08-23 DIAGNOSIS — G720 Drug-induced myopathy: Secondary | ICD-10-CM | POA: Diagnosis not present

## 2022-08-23 NOTE — Progress Notes (Signed)
LIPID CLINIC CONSULT NOTE  Chief Complaint:  Follow-up dyslipidemia  Primary Care Physician: Bradd Canary, MD  Primary Cardiologist:  Thomasene Ripple, DO  HPI:  [name] is a 62 y.o. female who is being seen today for the evaluation of dyslipidemia at the request of Bradd Canary, MD.  This is a pleasant 62 year old female here for evaluation management of dyslipidemia.  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  More recently she struggled with weight and is actually being evaluated by Dr. Gaynelle Adu for bariatric surgery.  In addition her cholesterol has been elevated, more recently total cholesterol 214, HDL 44, triglycerides 182 and LDL 138.  She denies any prior history of coronary disease although does have risk factors including hypertension, type 2 diabetes and a family history of heart disease.  She had previously tried a number of statins but is struggled with myalgias and possibly has fibromyalgia.  She had seen a rheumatologist for work-up for elevated CKs.  At one point she had a CK up in the 2400 but in general his CK tends to be around in the mid 200s without any statin therapy.  Currently she is not on any treatments.  06/30/2020  [name] seen today for follow-up.  Overall her cholesterol unfortunately has gone up.  She had to stop her statin and her ezetimibe.  The statin she felt was causing worsening skeletal myopathy.  Her CKs have ranged in the 200s.  Recent recheck does show some improvement with the CK now of 186.  LDL cholesterol however has risen from 138 up to 175, with total cholesterol 253, triglycerides 148 and HDL 41.  She understands the importance of lowering her cholesterol further.  Her liver enzymes have been normal.  We discussed today about the possibility of a PCSK9 inhibitor which I think is necessary given her high cholesterol.  I wonder if she could have a familial hyperlipidemia however I suspect primarily that  this is dietary.  She is interested in FH testing and requested a genetic test today.  She is planning on pursuing weight loss therapy.  She does have known coronary artery disease as previously mentioned, however it was mild with 0 coronary calcium and a small amount of noncalcified plaque in the RCA.  Seen on a coronary CT in January 2022.  She was also describing recurrent chest pain symptoms.  She is on Raynaud's is seen but does not report it helped her symptoms.  She is scheduled to follow-up with Dr. Servando Salina on March 24.  10/22/2020  [name] returns today for follow-up.  Overall she continues to do well.  Her cholesterols come down significantly.  Total is now 77 with HDL 43, LDL 17 and triglycerides 80.  She is on monotherapy Repatha 140 mg every 2 weeks.  She has lost weight and had previous gastric bypass surgery.  Since May she is now down about 12 pounds.  Will need to follow this closely as her LDL may get into the single digits or perhaps a negative calculated number.  We might consider discontinuing her therapy at that point or perhaps switching her to lower dose Praluent.  07/11/2021  [name] is seen today in follow-up.  Cholesterol continues to be low with total 102, triglycerides 69, HDL 58 and LDL 30 on Repatha.  She denies any chest pain or worsening shortness of breath.  She has had a recent increase in her CK's  which had near normalized.  This is not likely due to the Repatha however the etiology remains unclear.  She is also had some protein developing in her urine as well as some blood noted as well.  She has an underlying history of autoimmune disease but no diagnosis of lupus, which would come to mind for this.  08/23/2022  [name] is seen today in follow-up.  She continues to do well on Repatha.  Her CK was normal 2 months ago.  Her lipids continue to be well-controlled.  Total 124, triglycerides 111, HDL 69 and LDL 33 on Repatha.  No side effects that she can tell.  She is  continue to lose weight after bariatric surgery.  Recently she has had an issue with low copper levels.  There is also been some other issues with hypoglycemia but she is working with dietitians on that.  PMHx:  Past Medical History:  Diagnosis Date   Acute bronchitis 05/25/2016   Anxiety    Arthritis    Chronic pain syndrome 01/06/2013   Chronic rhinosinusitis    Colon polyps    Cough 01/06/2013   Depression    Diabetes (HCC) 01/06/2013   Diabetes mellitus type 2 in obese 01/06/2013   Sees Dr Nelson Chimes for eye exam Does not see podiatry, foot exam today unremarkable except for thick cracking skin on heals  pt denies    Dyspnea    with exertion    Dysuria 05/25/2016   Edema 01/06/2013   Epistaxis 05/25/2016   Fibroid, uterine    Fibromyalgia    GERD (gastroesophageal reflux disease)    Glaucoma 03/2012   Hoarseness    Hoarseness of voice 05/11/2013   Hyperglycemia 04/13/2013   Hyperlipemia, mixed 06/06/2007   Qualifier: Diagnosis of  By: Nena Jordan She feels Lipitor caused increased low back pain and weakness     Hyperlipidemia    Hypertension    Hypokalemia 02/05/2013   IBS (irritable bowel syndrome) 02/13/2011   Low back pain 03/03/2013   Muscle cramp 05/22/2014   Obesity    Obesity, unspecified 05/11/2013   OSA (obstructive sleep apnea)    cpap    Pain in joint, shoulder region 06/27/2015   Perimenopause 10/05/2012   Pre-diabetes    Raynaud disease 06/16/2013   Sleep apnea    Thyroid disease    hypothyroidism   Vocal fold nodules     Past Surgical History:  Procedure Laterality Date   ABDOMINAL HYSTERECTOMY     ANAL RECTAL MANOMETRY N/A 03/22/2022   Procedure: ANO RECTAL MANOMETRY;  Surgeon: Kerin Salen, MD;  Location: [facility];  Service: Gastroenterology;  Laterality: N/A;   BREAST EXCISIONAL BIOPSY Right    at age 68   BREAST SURGERY  lump remove breast   age 73 yrs   CHOLECYSTECTOMY     colonoscopoy      CYSTECTOMY     DILATION AND  CURETTAGE OF UTERUS     x2   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   Procedure: LAPAROSCOPIC ROUX-EN-Y GASTRIC BYPASS WITH UPPER ENDOSCOPY;  Surgeon: Gaynelle Adu, MD;  Location: [facility];  Service: General;  Laterality: N/A;   HERNIA REPAIR  5/22   I & D EXTREMITY Left 04/11/2018   Procedure: DEBIDMENT DISTAL INTERPHALANGEAL LEFT MIDDLE;  Surgeon: Cindee Salt, MD;  Location: [facility];  Service: Orthopedics;  Laterality: Left;   INCISIONAL HERNIA REPAIR  08/10/2020   Procedure: LAPAROSCOPIC PRIMARY REPAIR OF INCISIONAL HERNIA;  Surgeon: Gaynelle Adu,  MD;  Location: [facility];  Service: General;;   MASS EXCISION Left 04/11/2018   Procedure: EXCISION MASS;  Surgeon: Cindee Salt, MD;  Location: [facility];  Service: Orthopedics;  Laterality: Left;   NEPHROLITHOTOMY  10/14/2021   OOPHORECTOMY     POLYPECTOMY     ROTATOR CUFF REPAIR     UPPER GI ENDOSCOPY N/A 08/10/2020   Procedure: UPPER GI ENDOSCOPY;  Surgeon: Gaynelle Adu, MD;  Location: [facility];  Service: General;  Laterality: N/A;    FAMHx:  Family History  Problem Relation Age of Onset   Alcohol abuse Father    Cancer Father        renal and colon   Hyperlipidemia Father    Hypertension Father    Stroke Father    Heart disease Father    Cancer Paternal Grandfather        colon   Diabetes Other    High blood pressure Mother    High Cholesterol Mother    Kidney disease Mother    Depression Mother    Anxiety disorder Mother    Obesity Mother    Hypertension Mother    Breast cancer Other 24   Diabetes Sister    Diabetes Brother    Arthritis Maternal Grandmother    Heart disease Paternal Grandmother     SOCHx:   reports that she quit smoking about 19 years ago. Her smoking use included cigarettes. She has a 15.00 pack-year smoking history. She has never used smokeless tobacco. She reports that she does not drink alcohol and does not use drugs.  ALLERGIES:  Allergies  Allergen Reactions   Bupropion  Other (See Comments)    Dizziness and mental status changes Other reaction(s): Dizziness   Crestor [Rosuvastatin] Other (See Comments)    Myalgias and rising CPK   Losartan Cough   Atorvastatin     myalgias   Simvastatin     NDC Code:16714068101 NDC Code:16714068101 NDC ZOXW:96045409811 Other reaction(s): Myalgias (Muscle Pain)   Amoxicillin Rash   Aspirin Nausea Only and Other (See Comments)    GI upset REACTION: Upset stomach Other reaction(s): Vomiting   Codeine Rash   Erythromycin Rash and Other (See Comments)   Penicillins Rash    Reaction: 15 years    ROS: Pertinent items noted in HPI and remainder of comprehensive ROS otherwise negative.  HOME MEDS: Current Outpatient Medications on File Prior to Visit  Medication Sig Dispense Refill   acarbose (PRECOSE) 50 MG tablet Take 1 tablet (50 mg total) by mouth 3 (three) times daily with meals. 90 tablet 11   albuterol (VENTOLIN HFA) 108 (90 Base) MCG/ACT inhaler Inhale 2 puffs into the lungs every 4 (four) hours as needed for wheezing or shortness of breath. 18 g 0   bimatoprost (LUMIGAN) 0.01 % SOLN Place 1 drop into both eyes daily. 2.5 mL 12   busPIRone (BUSPAR) 7.5 MG tablet Take 1 tablet (7.5 mg total) by mouth 2 (two) times daily. 60 tablet 1   calcium carbonate (OS-CAL - DOSED IN MG OF ELEMENTAL CALCIUM) 1250 (500 Ca) MG tablet      Calcium Carbonate 500 MG CHEW Chew 500 mg by mouth daily at 12 noon.     Evolocumab (REPATHA SURECLICK) 140 MG/ML SOAJ Inject 140 mg into the skin every 14 (fourteen) days. 6 mL 1   furosemide (LASIX) 20 MG tablet Take 1 tablet (20 mg total) by mouth daily as needed. 90 tablet 1   gabapentin (NEURONTIN) 300 MG  capsule Take 1-2 capsules (300-600 mg total) by mouth every evening. (Patient taking differently: Take 300-600 mg by mouth every morning. As needed) 60 capsule 1   ipratropium (ATROVENT) 0.06 % nasal spray Place 2 sprays into both nostrils 3 (three) times daily as needed for rhinitis 15  mL 12   loratadine (CLARITIN) 10 MG tablet Take 20 mg by mouth daily.     metoprolol tartrate (LOPRESSOR) 25 MG tablet Take 0.5 tablets (12.5 mg total) by mouth 2 (two) times daily. 90 tablet 1   Milnacipran (SAVELLA) 50 MG TABS tablet Take 1 tablet (50 mg total) by mouth 2 (two) times daily. 60 tablet 5   modafinil (PROVIGIL) 200 MG tablet Take 2 tablets (400 mg total) by mouth daily. 60 tablet 5   Multiple Vitamins-Minerals (MULTIVITAMIN WITH MINERALS) tablet Take 1 tablet by mouth daily.     NIFEdipine (PROCARDIA-XL/NIFEDICAL-XL) 30 MG 24 hr tablet Take 1 tablet (30 mg total) by mouth daily. 30 tablet 3   pantoprazole (PROTONIX) 40 MG tablet Take 1 tablet (40 mg total) by mouth daily. 90 tablet 0   Prucalopride Succinate (MOTEGRITY) 2 MG TABS Take 1 tablet (2 mg total) by mouth daily. 30 tablet 0   spironolactone (ALDACTONE) 50 MG tablet Take 1 tablet (50 mg total) by mouth 3 (three) times daily. 270 tablet 3   thyroid (NP THYROID) 90 MG tablet Take 1 tablet (90 mg total) by mouth daily. 90 tablet 1   triamcinolone ointment (KENALOG) 0.5 % Apply 1 Application topically to the affected area 2 (two) times daily as needed. 30 g 0   Vitamin D, Ergocalciferol, (DRISDOL) 1.25 MG (50000 UNIT) CAPS capsule Take 1 capsule (50,000 Units total) by mouth every 7 (seven) days. 12 capsule 1   No current facility-administered medications on file prior to visit.    LABS/IMAGING: No results found. However, due to the size of the patient record, not all encounters were searched. Please check Results Review for a complete set of results. No results found.  LIPID PANEL:    Component Value Date/Time   CHOL 124 06/19/2022 0749   CHOL 77 (L) 10/11/2020 0816   TRIG 110.0 06/19/2022 0749   HDL 68.90 06/19/2022 0749   HDL 43 10/11/2020 0816   CHOLHDL 2 06/19/2022 0749   VLDL 22.0 06/19/2022 0749   LDLCALC 33 06/19/2022 0749   LDLCALC 17 10/11/2020 0816   LDLCALC 138 (H) 02/13/2020 0857   LDLDIRECT 159.0  11/11/2019 1125    WEIGHTS: Wt Readings from Last 3 Encounters:  08/23/22 182 lb 12.8 oz (82.9 kg)  07/31/22 185 lb (83.9 kg)  06/13/22 189 lb (85.7 kg)    VITALS: BP 128/80 (BP Location: Left Arm, Patient Position: Sitting, Cuff Size: Normal)   Pulse 79   Ht 5\' 4"  (1.626 m)   Wt 182 lb 12.8 oz (82.9 kg)   BMI 31.38 kg/m   EXAM: Deferred  EKG: Deferred  ASSESSMENT: Mixed dyslipidemia Mild noncalcified coronary disease by cardiac CT-04/2020 Statin myalgias,?  Statin myopathy with elevated CKs Family history of coronary disease Type 2 diabetes Hypertension Status post gastric bypass surgery  PLAN: 1.   [name] has done well on Repatha with marked improvement in her lipids.  She is tolerating without any muscle pains and her CKs have been negative.  She continues to lose weight after bypass surgery but has had an issue with hypoglycemia and low copper levels.  Otherwise I think she is doing very well.  She exercises regularly  without limitations.  Plan follow-up with me annually or sooner as necessary.  Chrystie Nose, MD, [facility], FACP  [place]  [facility]  Medical Director of the Advanced Lipid Disorders &  Cardiovascular Risk Reduction Clinic Diplomate of the American Board of Clinical Lipidology Attending Cardiologist  Direct Dial: [phone]  Fax: [phone]  Website:  [email or web address] 08/23/2022, 10:48 AM

## 2022-08-23 NOTE — Patient Instructions (Signed)
Medication Instructions:  NO CHANGES  *If you need a refill on your cardiac medications before your next appointment, please call your pharmacy*   Lab Work: FASTING lab work before next visit -- in 1 year  If you have labs (blood work) drawn today and your tests are completely normal, you will receive your results only by: MyChart Message (if you have MyChart) OR A paper copy in the mail If you have any lab test that is abnormal or we need to change your treatment, we will call you to review the results.   Testing/Procedures: NONE   Follow-Up: At [facility], you and your health needs are our priority.  As part of our continuing mission to provide you with exceptional heart care, we have created designated Provider Care Teams.  These Care Teams include your primary Cardiologist (physician) and Advanced Practice Providers (APPs -  Physician Assistants and Nurse Practitioners) who all work together to provide you with the care you need, when you need it.  We recommend signing up for the patient portal called "MyChart".  Sign up information is provided on this After Visit Summary.  MyChart is used to connect with patients for Virtual Visits (Telemedicine).  Patients are able to view lab/test results, encounter notes, upcoming appointments, etc.  Non-urgent messages can be sent to your provider as well.   To learn more about what you can do with MyChart, go to [email or web address].    Your next appointment:    12 months with Dr. Rennis Golden

## 2022-08-28 DIAGNOSIS — K5904 Chronic idiopathic constipation: Secondary | ICD-10-CM | POA: Diagnosis not present

## 2022-08-29 NOTE — Progress Notes (Unsigned)
   Rubin Payor, PhD, LAT, ATC acting as a scribe for Clementeen Graham, MD.  [name] is a 62 y.o. female who presents to [facility] today for 37-month f/u chronic R shoulder pain. Pt was last seen by Dr. Denyse Amass on 07/31/22 and was given a R AC joint steroid injection and advised to cont HEP. Today, pt reports R shoulder had been feeling good, until Monday. She is unsure how she exacerbated it, but she did bear crawls in her workout. Pain is along the superior aspect of her R shoulder.   Dx imaging: 07/31/22 R shoulder XR 11/07/2021 C-spine x-ray   Pertinent review of systems: No fevers or chills  Relevant historical information: Hypertension.  Diabetes.   Exam:  BP (!) 152/92   Pulse 92   Ht 5\' 4"  (1.626 m)   Wt 184 lb (83.5 kg)   SpO2 98%   BMI 31.58 kg/m  General: Well Developed, well nourished, and in no acute distress.   MSK: Right shoulder: Normal-appearing Normal shoulder range of motion. Tender palpation AC joint.  Pain with crossover arm compression test. Strength is intact.  Impingement tests are negative.    Lab and Radiology Results   EXAM: RIGHT SHOULDER - 2+ VIEW   COMPARISON:  Right shoulder radiographs 01/16/2019   FINDINGS: Mild peripheral glenoid degenerative osteophytosis. Mild acromioclavicular joint space narrowing and peripheral osteophytosis. No acute fracture or dislocation. The visualized portion of the right lung is unremarkable.   IMPRESSION: Mild acromioclavicular and glenohumeral osteoarthritis.     Electronically Signed   By: Neita Garnet M.D.   On: 07/31/2022 16:37   I, Clementeen Graham, personally (independently) visualized and performed the interpretation of the images attached in this note.     Assessment and Plan: 62 y.o. female with right shoulder pain thought to be due to [facility] DJD or irritation of the [facility] joint.  She did pretty well over the last month with a steroid injection of the [facility] joint.   Her pain has recently returned after doing some exercises including a bear crawl.  Plan for continued home exercise program and a bit of watchful waiting.  Also recommend trying Voltaren gel topically.  If this is not working in a week or 2 she will let me know and we can proceed to MRI.  MRI would be a logical next step to further understand source of pain and for potential surgical planning.    Discussed warning signs or symptoms. Please see discharge instructions. Patient expresses understanding.   The above documentation has been reviewed and is accurate and complete Clementeen Graham, M.D.

## 2022-08-30 ENCOUNTER — Ambulatory Visit: Payer: Commercial Managed Care - PPO | Admitting: Family Medicine

## 2022-08-30 ENCOUNTER — Other Ambulatory Visit: Payer: Self-pay

## 2022-08-30 VITALS — BP 152/92 | HR 92 | Ht 64.0 in | Wt 184.0 lb

## 2022-08-30 DIAGNOSIS — M25511 Pain in right shoulder: Secondary | ICD-10-CM | POA: Diagnosis not present

## 2022-08-30 DIAGNOSIS — G8929 Other chronic pain: Secondary | ICD-10-CM

## 2022-08-30 NOTE — Patient Instructions (Addendum)
Thank you for coming in today.   Re try the voltaren gel.   Please use Voltaren gel (Generic Diclofenac Gel) up to 4x daily for pain as needed.  This is available over-the-counter as both the name brand Voltaren gel and the generic diclofenac gel.   If not improving in 1-2 weeks let me know and I will do a MRI.   Let me know.

## 2022-09-01 DIAGNOSIS — H524 Presbyopia: Secondary | ICD-10-CM | POA: Diagnosis not present

## 2022-09-01 LAB — HM DIABETES EYE EXAM

## 2022-09-04 ENCOUNTER — Other Ambulatory Visit: Payer: Self-pay

## 2022-09-06 ENCOUNTER — Other Ambulatory Visit (HOSPITAL_COMMUNITY): Payer: Self-pay

## 2022-09-08 ENCOUNTER — Other Ambulatory Visit (HOSPITAL_COMMUNITY): Payer: Self-pay

## 2022-09-08 ENCOUNTER — Encounter (HOSPITAL_COMMUNITY): Payer: Self-pay | Admitting: Psychiatry

## 2022-09-08 ENCOUNTER — Telehealth (INDEPENDENT_AMBULATORY_CARE_PROVIDER_SITE_OTHER): Payer: Commercial Managed Care - PPO | Admitting: Psychiatry

## 2022-09-08 DIAGNOSIS — M797 Fibromyalgia: Secondary | ICD-10-CM | POA: Diagnosis not present

## 2022-09-08 DIAGNOSIS — F411 Generalized anxiety disorder: Secondary | ICD-10-CM

## 2022-09-08 MED ORDER — BUSPIRONE HCL 7.5 MG PO TABS
7.5000 mg | ORAL_TABLET | Freq: Two times a day (BID) | ORAL | 1 refills | Status: DC
  Filled 2022-09-08: qty 60, 30d supply, fill #0
  Filled 2022-10-08: qty 60, 30d supply, fill #1

## 2022-09-08 NOTE — Progress Notes (Signed)
[facility] Follow up visit  Patient Identification: [name] MRN:  [id number] Date of Evaluation:  09/08/2022 Referral Source: Primary care Chief Complaint:  follow up anxiety Visit Diagnosis:    ICD-10-CM   1. Generalized anxiety disorder  F41.1     2. Fibromyalgia  M79.7     3. GAD (generalized anxiety disorder)  F41.1      Virtual Visit via Video Note  I connected with [name] on 09/08/22 at 10:30 AM EDT by a video enabled telemedicine application and verified that I am speaking with the correct person using two identifiers.  Location: Patient: home  Provider: home office   I discussed the limitations of evaluation and management by telemedicine and the availability of in person appointments. The patient expressed understanding and agreed to proceed.      I discussed the assessment and treatment plan with the patient. The patient was provided an opportunity to ask questions and all were answered. The patient agreed with the plan and demonstrated an understanding of the instructions.   The patient was advised to call back or seek an in-person evaluation if the symptoms worsen or if the condition fails to improve as anticipated.  I provided 20  minutes of non-face-to-face time during this encounter.         History of Present Illness: Patient is a 62 years old female lives with her significant other initially referred by primary care physician for establish care for anxiety.  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  Does not have any kids  Job can be stressful , last seen 6 months ago. Noticing back memories of her brother death in ship 30 plus years ago, she tries to distract but has seen the video and upset her. Also says feel its her menopause or hot flashes bother her I gave option of SSRI or meds and therapy but she doesn't want to change meds   She is on buspar that helps anxiety to some extent   Para March for  fibromyalgia   She follows with [facility] for therapy  She uses a CPAP machine at night  Aggravating factors; job stress, brothers death Modifying factors; not that many  Duration since adult life      Past Psychiatric History: anxiety  Previous Psychotropic Medications: Yes  Effexor, xanax  Past Medical History:  Past Medical History:  Diagnosis Date   Acute bronchitis 05/25/2016   Anxiety    Arthritis    Chronic pain syndrome 01/06/2013   Chronic rhinosinusitis    Colon polyps    Cough 01/06/2013   Depression    Diabetes (HCC) 01/06/2013   Diabetes mellitus type 2 in obese 01/06/2013   Sees Dr Nelson Chimes for eye exam Does not see podiatry, foot exam today unremarkable except for thick cracking skin on heals  pt denies    Dyspnea    with exertion    Dysuria 05/25/2016   Edema 01/06/2013   Epistaxis 05/25/2016   Fibroid, uterine    Fibromyalgia    GERD (gastroesophageal reflux disease)    Glaucoma 03/2012   Hoarseness    Hoarseness of voice 05/11/2013   Hyperglycemia 04/13/2013   Hyperlipemia, mixed 06/06/2007   Qualifier: Diagnosis of  By: Nena Jordan She feels Lipitor caused increased low back pain and weakness     Hyperlipidemia    Hypertension    Hypokalemia 02/05/2013   IBS (irritable bowel syndrome) 02/13/2011  Low back pain 03/03/2013   Muscle cramp 05/22/2014   Obesity    Obesity, unspecified 05/11/2013   OSA (obstructive sleep apnea)    cpap    Pain in joint, shoulder region 06/27/2015   Perimenopause 10/05/2012   Pre-diabetes    Raynaud disease 06/16/2013   Sleep apnea    Thyroid disease    hypothyroidism   Vocal fold nodules     Past Surgical History:  Procedure Laterality Date   ABDOMINAL HYSTERECTOMY     ANAL RECTAL MANOMETRY N/A 03/22/2022   Procedure: ANO RECTAL MANOMETRY;  Surgeon: Kerin Salen, MD;  Location: [facility];  Service: Gastroenterology;  Laterality: N/A;   BREAST EXCISIONAL BIOPSY Right    at age 2    BREAST SURGERY  lump remove breast   age 34 yrs   CHOLECYSTECTOMY     colonoscopoy      CYSTECTOMY     DILATION AND CURETTAGE OF UTERUS     x2   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   Procedure: LAPAROSCOPIC ROUX-EN-Y GASTRIC BYPASS WITH UPPER ENDOSCOPY;  Surgeon: Gaynelle Adu, MD;  Location: [facility];  Service: General;  Laterality: N/A;   HERNIA REPAIR  5/22   I & D EXTREMITY Left 04/11/2018   Procedure: DEBIDMENT DISTAL INTERPHALANGEAL LEFT MIDDLE;  Surgeon: Cindee Salt, MD;  Location: [facility];  Service: Orthopedics;  Laterality: Left;   INCISIONAL HERNIA REPAIR  08/10/2020   Procedure: LAPAROSCOPIC PRIMARY REPAIR OF INCISIONAL HERNIA;  Surgeon: Gaynelle Adu, MD;  Location: [facility];  Service: General;;   MASS EXCISION Left 04/11/2018   Procedure: EXCISION MASS;  Surgeon: Cindee Salt, MD;  Location:  SURGERY CENTER;  Service: Orthopedics;  Laterality: Left;   NEPHROLITHOTOMY  10/14/2021   OOPHORECTOMY     POLYPECTOMY     ROTATOR CUFF REPAIR     UPPER GI ENDOSCOPY N/A 08/10/2020   Procedure: UPPER GI ENDOSCOPY;  Surgeon: Gaynelle Adu, MD;  Location: [facility];  Service: General;  Laterality: N/A;    Family Psychiatric History: dad : alcohol use  Family History:  Family History  Problem Relation Age of Onset   Alcohol abuse Father    Cancer Father        renal and colon   Hyperlipidemia Father    Hypertension Father    Stroke Father    Heart disease Father    Cancer Paternal Grandfather        colon   Diabetes Other    High blood pressure Mother    High Cholesterol Mother    Kidney disease Mother    Depression Mother    Anxiety disorder Mother    Obesity Mother    Hypertension Mother    Breast cancer Other 42   Diabetes Sister    Diabetes Brother    Arthritis Maternal Grandmother    Heart disease Paternal Grandmother     Social History:   Social History   Socioeconomic History   Marital status: Significant Other    Spouse name: Not on file    Number of children: 0   Years of education: Not on file   Highest education level: Not on file  Occupational History   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  Tobacco Use   Smoking status: Former    Packs/day: 0.50    Years: 30.00    Additional pack years: 0.00    Total pack years: 15.00    Types: Cigarettes    Quit date: 04/25/2003    Years since  quitting: 19.3   Smokeless tobacco: Never   Tobacco comments:    smoked since age 11  Vaping Use   Vaping Use: Never used  Substance and Sexual Activity   Alcohol use: No   Drug use: Never   Sexual activity: Not Currently    Partners: Male    Birth control/protection: None  Other Topics Concern   Not on file  Social History Narrative   Endo-- Dr Lucianne Muss   ENT--Dr shoemaker   GI--Dr Buccini   Pulm--Dr Clance   Rheum--Dr Kellie Simmering         Social Determinants of Health   Financial Resource Strain: Not on file  Food Insecurity: Not on file  Transportation Needs: Not on file  Physical Activity: Not on file  Stress: Not on file  Social Connections: Not on file      Allergies:   Allergies  Allergen Reactions   Bupropion Other (See Comments)    Dizziness and mental status changes Other reaction(s): Dizziness   Crestor [Rosuvastatin] Other (See Comments)    Myalgias and rising CPK   Losartan Cough   Atorvastatin     myalgias   Simvastatin     NDC Code:[id number] NDC ZOXW:[id number] NDC BJYN:[id number] Other reaction(s): Myalgias (Muscle Pain)   Amoxicillin Rash   Aspirin Nausea Only and Other (See Comments)    GI upset REACTION: Upset stomach Other reaction(s): Vomiting   Codeine Rash   Erythromycin Rash and Other (See Comments)   Penicillins Rash    Reaction: 15 years    Metabolic Disorder Labs: Lab Results  Component Value Date   HGBA1C 6.0 02/13/2022   MPG 122.63 08/03/2020   MPG 137 02/13/2020   No results found for: "PROLACTIN" Lab Results  Component Value Date   CHOL 124 06/19/2022   TRIG 110.0  06/19/2022   HDL 68.90 06/19/2022   CHOLHDL 2 06/19/2022   VLDL 22.0 06/19/2022   LDLCALC 33 06/19/2022   LDLCALC 29 02/13/2022   Lab Results  Component Value Date   TSH 0.61 02/13/2022    Therapeutic Level Labs: No results found for: "LITHIUM" No results found for: "CBMZ" No results found for: "VALPROATE"  Current Medications: Current Outpatient Medications  Medication Sig Dispense Refill   acarbose (PRECOSE) 50 MG tablet Take 1 tablet (50 mg total) by mouth 3 (three) times daily with meals. 90 tablet 11   albuterol (VENTOLIN HFA) 108 (90 Base) MCG/ACT inhaler Inhale 2 puffs into the lungs every 4 (four) hours as needed for wheezing or shortness of breath. 18 g 0   bimatoprost (LUMIGAN) 0.01 % SOLN Place 1 drop into both eyes daily. 2.5 mL 12   busPIRone (BUSPAR) 7.5 MG tablet Take 1 tablet (7.5 mg total) by mouth 2 (two) times daily. 60 tablet 1   calcium carbonate (OS-CAL - DOSED IN MG OF ELEMENTAL CALCIUM) 1250 (500 Ca) MG tablet      Calcium Carbonate 500 MG CHEW Chew 500 mg by mouth daily at 12 noon.     Evolocumab (REPATHA SURECLICK) 140 MG/ML SOAJ Inject 140 mg into the skin every 14 (fourteen) days. 6 mL 1   furosemide (LASIX) 20 MG tablet Take 1 tablet (20 mg total) by mouth daily as needed. 90 tablet 1   gabapentin (NEURONTIN) 300 MG capsule Take 1-2 capsules (300-600 mg total) by mouth every evening. (Patient taking differently: Take 300-600 mg by mouth every morning. As needed) 60 capsule 1   ipratropium (ATROVENT) 0.06 % nasal spray Place 2 sprays into  both nostrils 3 (three) times daily as needed for rhinitis 15 mL 12   loratadine (CLARITIN) 10 MG tablet Take 20 mg by mouth daily.     metoprolol tartrate (LOPRESSOR) 25 MG tablet Take 0.5 tablets (12.5 mg total) by mouth 2 (two) times daily. 90 tablet 1   Milnacipran (SAVELLA) 50 MG TABS tablet Take 1 tablet (50 mg total) by mouth 2 (two) times daily. 60 tablet 5   modafinil (PROVIGIL) 200 MG tablet Take 2 tablets (400  mg total) by mouth daily. 60 tablet 5   Multiple Vitamins-Minerals (MULTIVITAMIN WITH MINERALS) tablet Take 1 tablet by mouth daily.     NIFEdipine (PROCARDIA-XL/NIFEDICAL-XL) 30 MG 24 hr tablet Take 1 tablet (30 mg total) by mouth daily. 30 tablet 3   pantoprazole (PROTONIX) 40 MG tablet Take 1 tablet (40 mg total) by mouth daily. 90 tablet 0   Prucalopride Succinate (MOTEGRITY) 2 MG TABS Take 1 tablet (2 mg total) by mouth daily. 30 tablet 0   spironolactone (ALDACTONE) 50 MG tablet Take 1 tablet (50 mg total) by mouth 3 (three) times daily. 270 tablet 3   thyroid (NP THYROID) 90 MG tablet Take 1 tablet (90 mg total) by mouth daily. 90 tablet 1   triamcinolone ointment (KENALOG) 0.5 % Apply 1 Application topically to the affected area 2 (two) times daily as needed. 30 g 0   Vitamin D, Ergocalciferol, (DRISDOL) 1.25 MG (50000 UNIT) CAPS capsule Take 1 capsule (50,000 Units total) by mouth every 7 (seven) days. 12 capsule 1   No current facility-administered medications for this visit.     Psychiatric Specialty Exam: Review of Systems  Cardiovascular:  Negative for chest pain.  Psychiatric/Behavioral:  Negative for agitation and self-injury. The patient is nervous/anxious.     There were no vitals taken for this visit.There is no height or weight on file to calculate BMI.  General Appearance: Casual  Eye Contact:  Fair  Speech:  Slow  Volume:  Decreased  Mood:  stressed  Affect:  Constricted  Thought Process:  Goal Directed  Orientation:  Full (Time, Place, and Person)  Thought Content:  Rumination  Suicidal Thoughts:  No  Homicidal Thoughts:  No  Memory:  Immediate;   Fair Recent;   Fair  Judgement:  Fair  Insight:  Fair  Psychomotor Activity:  Decreased  Concentration:  Concentration: Fair and Attention Span: Fair  Recall:  [employer] of Knowledge:Fair  Language: Fair  Akathisia:  No  Handed:    AIMS (if indicated):  not done  Assets:  Desire for Improvement Financial  Resources/Insurance  ADL's:  Intact  Cognition: WNL  Sleep:  Fair   Screenings: GAD-7    Flowsheet Row Office Visit from [facility]/22/2015 in [facility]  Total GAD-7 Score 0      PHQ2-9    Flowsheet Row Office Visit from 06/13/2022 in [facility] Office Visit from 02/13/2022 in [facility] at [facility] Office Visit from 11/07/2021 in [facility] at [facility] Video Visit from 08/04/2021 in [facility] Primary Care at [facility] Office Visit from 09/09/2020 in [facility] Care at [facility]  PHQ-2 Total Score 0 0 0 0 0  PHQ-9 Total Score 0 0 0 0 0      Flowsheet Row Video Visit from 08/29/2021 in [facility]  at [employer] Video Visit from 03/07/2021 in [facility] at [facility] Video Visit from 11/29/2020 in [facility] at [facility]  C-SSRS RISK CATEGORY No Risk No Risk No Risk       Assessment and Plan:  As follows  Prior documentation reviewed   GAD:job stress and brothers death, we discussed to remember his good part of life. Continue therapy and work on distraction, can increase buspar to tid a times regular dose is bid She does not want to add more meds or SSRI R/o PTSD; work on distraction and therapy  Fibromyalgia.manageable with savella  She will follow with PCP in regard to her wellness check and related concerns  She still wants to follow in 16m. Call back earlier if needed or concersn    Thresa Ross, MD 5/17/202410:49 AM

## 2022-09-10 ENCOUNTER — Other Ambulatory Visit: Payer: Self-pay | Admitting: Vascular Surgery

## 2022-09-11 ENCOUNTER — Other Ambulatory Visit: Payer: Self-pay

## 2022-09-11 ENCOUNTER — Encounter: Payer: Self-pay | Admitting: Family Medicine

## 2022-09-11 DIAGNOSIS — G8929 Other chronic pain: Secondary | ICD-10-CM

## 2022-09-11 NOTE — Telephone Encounter (Signed)
Dr. Denyse Amass,  MRI order pending.   Please confirm MRI right shoulder w/ CM, w/o CM, or w/ & w/o CM.   Thanks!

## 2022-09-11 NOTE — Addendum Note (Signed)
Addended by: Dierdre Searles on: 09/11/2022 11:28 AM   Modules accepted: Orders

## 2022-09-12 NOTE — Addendum Note (Signed)
Addended by: Rodolph Bong on: 09/12/2022 07:17 AM   Modules accepted: Orders

## 2022-09-14 ENCOUNTER — Other Ambulatory Visit (HOSPITAL_COMMUNITY): Payer: Self-pay

## 2022-09-18 ENCOUNTER — Other Ambulatory Visit: Payer: Self-pay | Admitting: Family Medicine

## 2022-09-19 ENCOUNTER — Other Ambulatory Visit: Payer: Self-pay

## 2022-09-19 ENCOUNTER — Other Ambulatory Visit (HOSPITAL_COMMUNITY): Payer: Self-pay

## 2022-09-19 MED ORDER — PANTOPRAZOLE SODIUM 40 MG PO TBEC
40.0000 mg | DELAYED_RELEASE_TABLET | Freq: Every day | ORAL | 0 refills | Status: DC
  Filled 2022-09-19: qty 90, 90d supply, fill #0

## 2022-09-25 ENCOUNTER — Other Ambulatory Visit: Payer: Commercial Managed Care - PPO

## 2022-09-29 ENCOUNTER — Other Ambulatory Visit (HOSPITAL_COMMUNITY): Payer: Self-pay

## 2022-10-03 ENCOUNTER — Ambulatory Visit (INDEPENDENT_AMBULATORY_CARE_PROVIDER_SITE_OTHER): Payer: Commercial Managed Care - PPO

## 2022-10-03 ENCOUNTER — Other Ambulatory Visit (HOSPITAL_COMMUNITY): Payer: Self-pay

## 2022-10-03 ENCOUNTER — Other Ambulatory Visit: Payer: Self-pay | Admitting: Family Medicine

## 2022-10-03 DIAGNOSIS — M25511 Pain in right shoulder: Secondary | ICD-10-CM

## 2022-10-03 DIAGNOSIS — M75121 Complete rotator cuff tear or rupture of right shoulder, not specified as traumatic: Secondary | ICD-10-CM | POA: Diagnosis not present

## 2022-10-03 DIAGNOSIS — G8929 Other chronic pain: Secondary | ICD-10-CM

## 2022-10-03 DIAGNOSIS — M75111 Incomplete rotator cuff tear or rupture of right shoulder, not specified as traumatic: Secondary | ICD-10-CM | POA: Diagnosis not present

## 2022-10-03 DIAGNOSIS — M67813 Other specified disorders of tendon, right shoulder: Secondary | ICD-10-CM | POA: Diagnosis not present

## 2022-10-03 MED ORDER — METOPROLOL TARTRATE 25 MG PO TABS
12.5000 mg | ORAL_TABLET | Freq: Two times a day (BID) | ORAL | 1 refills | Status: DC
  Filled 2022-10-03: qty 90, 90d supply, fill #0
  Filled 2023-01-06: qty 90, 90d supply, fill #1

## 2022-10-04 DIAGNOSIS — E119 Type 2 diabetes mellitus without complications: Secondary | ICD-10-CM | POA: Diagnosis not present

## 2022-10-04 DIAGNOSIS — H40013 Open angle with borderline findings, low risk, bilateral: Secondary | ICD-10-CM | POA: Diagnosis not present

## 2022-10-09 ENCOUNTER — Other Ambulatory Visit: Payer: Self-pay

## 2022-10-10 ENCOUNTER — Encounter: Payer: Self-pay | Admitting: Family Medicine

## 2022-10-10 ENCOUNTER — Other Ambulatory Visit: Payer: Self-pay

## 2022-10-10 DIAGNOSIS — G8929 Other chronic pain: Secondary | ICD-10-CM

## 2022-10-10 NOTE — Progress Notes (Signed)
Shoulder MRI shows a rotator cuff tear that looks like it may require surgery.  I can refer you directly to orthopedic surgery if you would like.  Otherwise recheck with me soon.  Please let me know if you want referral directly to orthopedic surgery.

## 2022-10-11 ENCOUNTER — Other Ambulatory Visit (HOSPITAL_COMMUNITY): Payer: Self-pay

## 2022-10-11 DIAGNOSIS — E042 Nontoxic multinodular goiter: Secondary | ICD-10-CM | POA: Diagnosis not present

## 2022-10-11 DIAGNOSIS — K912 Postsurgical malabsorption, not elsewhere classified: Secondary | ICD-10-CM | POA: Diagnosis not present

## 2022-10-11 DIAGNOSIS — R7309 Other abnormal glucose: Secondary | ICD-10-CM | POA: Diagnosis not present

## 2022-10-11 DIAGNOSIS — E039 Hypothyroidism, unspecified: Secondary | ICD-10-CM | POA: Diagnosis not present

## 2022-10-22 ENCOUNTER — Other Ambulatory Visit: Payer: Self-pay | Admitting: Family Medicine

## 2022-10-23 ENCOUNTER — Other Ambulatory Visit (HOSPITAL_COMMUNITY): Payer: Self-pay

## 2022-10-23 MED ORDER — SAVELLA 50 MG PO TABS
50.0000 mg | ORAL_TABLET | Freq: Two times a day (BID) | ORAL | 5 refills | Status: DC
  Filled 2022-10-23: qty 60, 30d supply, fill #0
  Filled 2022-11-19: qty 60, 30d supply, fill #1
  Filled 2022-12-18: qty 60, 30d supply, fill #2
  Filled 2023-01-08 – 2023-01-22 (×3): qty 60, 30d supply, fill #3
  Filled 2023-02-24: qty 60, 30d supply, fill #4
  Filled 2023-03-23: qty 60, 30d supply, fill #5

## 2022-10-31 ENCOUNTER — Other Ambulatory Visit: Payer: Self-pay | Admitting: Oncology

## 2022-10-31 DIAGNOSIS — Z006 Encounter for examination for normal comparison and control in clinical research program: Secondary | ICD-10-CM

## 2022-11-01 DIAGNOSIS — Z1231 Encounter for screening mammogram for malignant neoplasm of breast: Secondary | ICD-10-CM | POA: Diagnosis not present

## 2022-11-12 NOTE — Assessment & Plan Note (Signed)
Encourage heart healthy diet such as MIND or DASH diet, increase exercise, avoid trans fats, simple carbohydrates and processed foods, consider a krill or fish or flaxseed oil cap daily.  °

## 2022-11-12 NOTE — Progress Notes (Unsigned)
Subjective:    Patient ID: [name], female    DOB: 05/18/1960, 62 y.o.   MRN: [id number]  No chief complaint on file.   HPI Discussed the use of AI scribe software for clinical note transcription with the patient, who gave verbal consent to proceed.  History of Present Illness        Patient is a 62 yo female in today for follow up on chronic medical concerns. No recent febrile illness or hospitalizations. Denies CP/palp/SOB/HA/congestion/fevers/GI or GU c/o. Taking meds as prescribed     Past Medical History:  Diagnosis Date   Acute bronchitis 05/25/2016   Anxiety    Arthritis    Chronic pain syndrome 01/06/2013   Chronic rhinosinusitis    Colon polyps    Cough 01/06/2013   Depression    Diabetes (HCC) 01/06/2013   Diabetes mellitus type 2 in obese 01/06/2013   Sees Dr Nelson Chimes for eye exam Does not see podiatry, foot exam today unremarkable except for thick cracking skin on heals  pt denies    Dyspnea    with exertion    Dysuria 05/25/2016   Edema 01/06/2013   Epistaxis 05/25/2016   Fibroid, uterine    Fibromyalgia    GERD (gastroesophageal reflux disease)    Glaucoma 03/2012   Hoarseness    Hoarseness of voice 05/11/2013   Hyperglycemia 04/13/2013   Hyperlipemia, mixed 06/06/2007   Qualifier: Diagnosis of  By: Nena Jordan She feels Lipitor caused increased low back pain and weakness     Hyperlipidemia    Hypertension    Hypokalemia 02/05/2013   IBS (irritable bowel syndrome) 02/13/2011   Low back pain 03/03/2013   Muscle cramp 05/22/2014   Obesity    Obesity, unspecified 05/11/2013   OSA (obstructive sleep apnea)    cpap    Pain in joint, shoulder region 06/27/2015   Perimenopause 10/05/2012   Pre-diabetes    Raynaud disease 06/16/2013   Sleep apnea    Thyroid disease    hypothyroidism   Vocal fold nodules     Past Surgical History:  Procedure Laterality Date   ABDOMINAL HYSTERECTOMY     ANAL RECTAL MANOMETRY N/A 03/22/2022    Procedure: ANO RECTAL MANOMETRY;  Surgeon: Kerin Salen, MD;  Location: [facility];  Service: Gastroenterology;  Laterality: N/A;   BREAST EXCISIONAL BIOPSY Right    at age 57   BREAST SURGERY  lump remove breast   age 19 yrs   CHOLECYSTECTOMY     colonoscopoy      CYSTECTOMY     DILATION AND CURETTAGE OF UTERUS     x2   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   Procedure: LAPAROSCOPIC ROUX-EN-Y GASTRIC BYPASS WITH UPPER ENDOSCOPY;  Surgeon: Gaynelle Adu, MD;  Location: [facility];  Service: General;  Laterality: N/A;   HERNIA REPAIR  5/22   I & D EXTREMITY Left 04/11/2018   Procedure: DEBIDMENT DISTAL INTERPHALANGEAL LEFT MIDDLE;  Surgeon: Cindee Salt, MD;  Location: [facility];  Service: Orthopedics;  Laterality: Left;   INCISIONAL HERNIA REPAIR  08/10/2020   Procedure: LAPAROSCOPIC PRIMARY REPAIR OF INCISIONAL HERNIA;  Surgeon: Gaynelle Adu, MD;  Location: [facility];  Service: General;;   MASS EXCISION Left 04/11/2018   Procedure: EXCISION MASS;  Surgeon: Cindee Salt, MD;  Location: [facility];  Service: Orthopedics;  Laterality: Left;   NEPHROLITHOTOMY  10/14/2021   OOPHORECTOMY     POLYPECTOMY     ROTATOR CUFF REPAIR  UPPER GI ENDOSCOPY N/A 08/10/2020   Procedure: UPPER GI ENDOSCOPY;  Surgeon: Gaynelle Adu, MD;  Location: [facility];  Service: General;  Laterality: N/A;    Family History  Problem Relation Age of Onset   Alcohol abuse Father    Cancer Father        renal and colon   Hyperlipidemia Father    Hypertension Father    Stroke Father    Heart disease Father    Cancer Paternal Grandfather        colon   Diabetes Other    High blood pressure Mother    High Cholesterol Mother    Kidney disease Mother    Depression Mother    Anxiety disorder Mother    Obesity Mother    Hypertension Mother    Breast cancer Other 53   Diabetes Sister    Diabetes Brother    Arthritis Maternal Grandmother    Heart disease Paternal Grandmother     Social  History   Socioeconomic History   Marital status: Significant Other    Spouse name: Not on file   Number of children: 0   Years of education: Not on file   Highest education level: Not on file  Occupational History   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  Tobacco Use   Smoking status: Former    Current packs/day: 0.00    Average packs/day: 0.5 packs/day for 30.0 years (15.0 ttl pk-yrs)    Types: Cigarettes    Start date: 04/24/1973    Quit date: 04/25/2003    Years since quitting: 19.5   Smokeless tobacco: Never   Tobacco comments:    smoked since age 33  Vaping Use   Vaping status: Never Used  Substance and Sexual Activity   Alcohol use: No   Drug use: Never   Sexual activity: Not Currently    Partners: Male    Birth control/protection: None  Other Topics Concern   Not on file  Social History Narrative   Endo-- Dr Lucianne Muss   ENT--Dr shoemaker   GI--Dr Buccini   Pulm--Dr Clance   Rheum--Dr Kellie Simmering         Social Determinants of Health   Financial Resource Strain: Not on file  Food Insecurity: Not on file  Transportation Needs: Not on file  Physical Activity: Not on file  Stress: Not on file  Social Connections: Not on file  Intimate Partner Violence: Not on file    Outpatient Medications Prior to Visit  Medication Sig Dispense Refill   acarbose (PRECOSE) 50 MG tablet Take 1 tablet (50 mg total) by mouth 3 (three) times daily with meals. 90 tablet 11   albuterol (VENTOLIN HFA) 108 (90 Base) MCG/ACT inhaler Inhale 2 puffs into the lungs every 4 (four) hours as needed for wheezing or shortness of breath. 18 g 0   bimatoprost (LUMIGAN) 0.01 % SOLN Place 1 drop into both eyes daily. 2.5 mL 12   busPIRone (BUSPAR) 7.5 MG tablet Take 1 tablet (7.5 mg total) by mouth 2 (two) times daily. 60 tablet 1   calcium carbonate (OS-CAL - DOSED IN MG OF ELEMENTAL CALCIUM) 1250 (500 Ca) MG tablet      Calcium Carbonate 500 MG CHEW Chew 500 mg by mouth daily at 12 noon.     Evolocumab  (REPATHA SURECLICK) 140 MG/ML SOAJ Inject 140 mg into the skin every 14 (fourteen) days. 6 mL 1   furosemide (LASIX) 20 MG tablet Take 1 tablet (20 mg total) by mouth  daily as needed. 90 tablet 1   gabapentin (NEURONTIN) 300 MG capsule Take 1-2 capsules (300-600 mg total) by mouth every evening. (Patient taking differently: Take 300-600 mg by mouth every morning. As needed) 60 capsule 1   ipratropium (ATROVENT) 0.06 % nasal spray Place 2 sprays into both nostrils 3 (three) times daily as needed for rhinitis 15 mL 12   loratadine (CLARITIN) 10 MG tablet Take 20 mg by mouth daily.     metoprolol tartrate (LOPRESSOR) 25 MG tablet Take 0.5 tablets (12.5 mg total) by mouth 2 (two) times daily. 90 tablet 1   Milnacipran (SAVELLA) 50 MG TABS tablet Take 1 tablet (50 mg total) by mouth 2 (two) times daily. 60 tablet 5   modafinil (PROVIGIL) 200 MG tablet Take 2 tablets (400 mg total) by mouth daily. 60 tablet 5   Multiple Vitamins-Minerals (MULTIVITAMIN WITH MINERALS) tablet Take 1 tablet by mouth daily.     NIFEdipine (PROCARDIA-XL/NIFEDICAL-XL) 30 MG 24 hr tablet Take 1 tablet (30 mg total) by mouth daily. 30 tablet 3   pantoprazole (PROTONIX) 40 MG tablet Take 1 tablet (40 mg total) by mouth daily. 90 tablet 0   Prucalopride Succinate (MOTEGRITY) 2 MG TABS Take 1 tablet (2 mg total) by mouth daily. 30 tablet 0   spironolactone (ALDACTONE) 50 MG tablet Take 1 tablet (50 mg total) by mouth 3 (three) times daily. 270 tablet 3   thyroid (NP THYROID) 90 MG tablet Take 1 tablet (90 mg total) by mouth daily. 90 tablet 1   triamcinolone ointment (KENALOG) 0.5 % Apply 1 Application topically to the affected area 2 (two) times daily as needed. 30 g 0   Vitamin D, Ergocalciferol, (DRISDOL) 1.25 MG (50000 UNIT) CAPS capsule Take 1 capsule (50,000 Units total) by mouth every 7 (seven) days. 12 capsule 1   No facility-administered medications prior to visit.    Allergies  Allergen Reactions   Bupropion Other (See  Comments)    Dizziness and mental status changes Other reaction(s): Dizziness   Crestor [Rosuvastatin] Other (See Comments)    Myalgias and rising CPK   Losartan Cough   Atorvastatin     myalgias   Simvastatin     NDC Code:16714068101 NDC Code:16714068101 NDC QMVH:[id number] Other reaction(s): Myalgias (Muscle Pain)   Amoxicillin Rash   Aspirin Nausea Only and Other (See Comments)    GI upset REACTION: Upset stomach Other reaction(s): Vomiting   Codeine Rash   Erythromycin Rash and Other (See Comments)   Penicillins Rash    Reaction: 15 years    ROS     Objective:    Physical Exam  There were no vitals taken for this visit. Wt Readings from Last 3 Encounters:  08/30/22 184 lb (83.5 kg)  08/23/22 182 lb 12.8 oz (82.9 kg)  07/31/22 185 lb (83.9 kg)    Diabetic Foot Exam - Simple   No data filed    Lab Results  Component Value Date   WBC 3.2 (L) 02/13/2022   HGB 14.6 02/13/2022   HCT 43.5 02/13/2022   PLT 301.0 02/13/2022   GLUCOSE 86 02/13/2022   CHOL 124 06/19/2022   TRIG 110.0 06/19/2022   HDL 68.90 06/19/2022   LDLDIRECT 159.0 11/11/2019   LDLCALC 33 06/19/2022   ALT 68 (H) 02/13/2022   AST 46 (H) 02/13/2022   NA 140 02/13/2022   K 4.4 02/13/2022   CL 104 02/13/2022   CREATININE 0.69 02/13/2022   BUN 13 02/13/2022   CO2 28 02/13/2022  TSH 0.61 02/13/2022   HGBA1C 6.0 02/13/2022   MICROALBUR 0.8 05/09/2021    Lab Results  Component Value Date   TSH 0.61 02/13/2022   Lab Results  Component Value Date   WBC 3.2 (L) 02/13/2022   HGB 14.6 02/13/2022   HCT 43.5 02/13/2022   MCV 91.7 02/13/2022   PLT 301.0 02/13/2022   Lab Results  Component Value Date   NA 140 02/13/2022   K 4.4 02/13/2022   CO2 28 02/13/2022   GLUCOSE 86 02/13/2022   BUN 13 02/13/2022   CREATININE 0.69 02/13/2022   BILITOT 0.3 02/13/2022   ALKPHOS 94 02/13/2022   AST 46 (H) 02/13/2022   ALT 68 (H) 02/13/2022   PROT 6.8 02/13/2022   ALBUMIN 4.3 02/13/2022    CALCIUM 9.8 02/13/2022   ANIONGAP 7 08/16/2020   EGFR 58 (L) 06/22/2020   GFR 93.81 02/13/2022   Lab Results  Component Value Date   CHOL 124 06/19/2022   Lab Results  Component Value Date   HDL 68.90 06/19/2022   Lab Results  Component Value Date   LDLCALC 33 06/19/2022   Lab Results  Component Value Date   TRIG 110.0 06/19/2022   Lab Results  Component Value Date   CHOLHDL 2 06/19/2022   Lab Results  Component Value Date   HGBA1C 6.0 02/13/2022       Assessment & Plan:  Hypothyroidism, unspecified type Assessment & Plan: On NP Thyroid and being followed by endocrinology   Hyperlipemia, mixed Assessment & Plan: Encourage heart healthy diet such as MIND or DASH diet, increase exercise, avoid trans fats, simple carbohydrates and processed foods, consider a krill or fish or flaxseed oil cap daily.    Vitamin D deficiency Assessment & Plan: Supplement and monitor    Myalgia Assessment & Plan: Hydrate and monitor   Hypertension, unspecified type Assessment & Plan: Well controlled, no changes to meds. Encouraged heart healthy diet such as the DASH diet and exercise as tolerated.     Vitamin B12 deficiency Assessment & Plan: Supplement and monitor    Familial hypercholesterolemia Assessment & Plan: Encourage heart healthy diet such as MIND or DASH diet, increase exercise, avoid trans fats, simple carbohydrates and processed foods, consider a krill or fish or flaxseed oil cap daily.     Type 2 diabetes mellitus with obesity (HCC) Assessment & Plan: hgba1c acceptable, minimize simple carbs. Increase exercise as tolerated. Continue current meds      Assessment and Plan              Danise Edge, MD

## 2022-11-12 NOTE — Assessment & Plan Note (Signed)
Well controlled, no changes to meds. Encouraged heart healthy diet such as the DASH diet and exercise as tolerated.  °

## 2022-11-12 NOTE — Assessment & Plan Note (Signed)
hgba1c acceptable, minimize simple carbs. Increase exercise as tolerated. Continue current meds 

## 2022-11-12 NOTE — Assessment & Plan Note (Signed)
Patient encouraged to maintain heart healthy diet, regular exercise, adequate sleep. Consider daily probiotics. Take medications as prescribed. Labs ordered and reviewed  COLONOSCOPY:07/28/20 with [facility], repeat in 2027 MAMMO: 10/01/20, repeat in next year  PAP: 05/26/20 repeat 2025 to 2027  DEXA: 5/21, normal, repeat 2026 Given and reviewed copy of ACP documents from [employer] and encouraged to complete and return

## 2022-11-12 NOTE — Assessment & Plan Note (Signed)
Supplement and monitor 

## 2022-11-12 NOTE — Assessment & Plan Note (Signed)
On NP Thyroid and being followed by endocrinology

## 2022-11-12 NOTE — Assessment & Plan Note (Signed)
Hydrate and monitor 

## 2022-11-13 ENCOUNTER — Ambulatory Visit (INDEPENDENT_AMBULATORY_CARE_PROVIDER_SITE_OTHER): Payer: Commercial Managed Care - PPO | Admitting: Family Medicine

## 2022-11-13 VITALS — BP 136/84 | HR 58 | Temp 97.8°F | Resp 16 | Ht 64.0 in | Wt 185.0 lb

## 2022-11-13 DIAGNOSIS — I10 Essential (primary) hypertension: Secondary | ICD-10-CM

## 2022-11-13 DIAGNOSIS — M791 Myalgia, unspecified site: Secondary | ICD-10-CM | POA: Diagnosis not present

## 2022-11-13 DIAGNOSIS — Z0000 Encounter for general adult medical examination without abnormal findings: Secondary | ICD-10-CM

## 2022-11-13 DIAGNOSIS — M25511 Pain in right shoulder: Secondary | ICD-10-CM

## 2022-11-13 DIAGNOSIS — R1032 Left lower quadrant pain: Secondary | ICD-10-CM

## 2022-11-13 DIAGNOSIS — E538 Deficiency of other specified B group vitamins: Secondary | ICD-10-CM

## 2022-11-13 DIAGNOSIS — E669 Obesity, unspecified: Secondary | ICD-10-CM

## 2022-11-13 DIAGNOSIS — E7801 Familial hypercholesterolemia: Secondary | ICD-10-CM | POA: Diagnosis not present

## 2022-11-13 DIAGNOSIS — E1169 Type 2 diabetes mellitus with other specified complication: Secondary | ICD-10-CM | POA: Diagnosis not present

## 2022-11-13 DIAGNOSIS — Z9884 Bariatric surgery status: Secondary | ICD-10-CM | POA: Diagnosis not present

## 2022-11-13 DIAGNOSIS — E039 Hypothyroidism, unspecified: Secondary | ICD-10-CM | POA: Diagnosis not present

## 2022-11-13 DIAGNOSIS — E559 Vitamin D deficiency, unspecified: Secondary | ICD-10-CM | POA: Diagnosis not present

## 2022-11-13 DIAGNOSIS — R194 Change in bowel habit: Secondary | ICD-10-CM

## 2022-11-13 DIAGNOSIS — E782 Mixed hyperlipidemia: Secondary | ICD-10-CM

## 2022-11-13 DIAGNOSIS — H9312 Tinnitus, left ear: Secondary | ICD-10-CM

## 2022-11-13 LAB — CBC WITH DIFFERENTIAL/PLATELET
Basophils Absolute: 0.1 10*3/uL (ref 0.0–0.1)
Basophils Relative: 3.1 % — ABNORMAL HIGH (ref 0.0–3.0)
Eosinophils Absolute: 0.1 10*3/uL (ref 0.0–0.7)
Eosinophils Relative: 1.8 % (ref 0.0–5.0)
HCT: 42.5 % (ref 36.0–46.0)
Hemoglobin: 13.8 g/dL (ref 12.0–15.0)
Lymphocytes Relative: 39.7 % (ref 12.0–46.0)
Lymphs Abs: 1.6 10*3/uL (ref 0.7–4.0)
MCHC: 32.4 g/dL (ref 30.0–36.0)
MCV: 92.3 fl (ref 78.0–100.0)
Monocytes Absolute: 0.4 10*3/uL (ref 0.1–1.0)
Monocytes Relative: 9.3 % (ref 3.0–12.0)
Neutro Abs: 1.9 10*3/uL (ref 1.4–7.7)
Neutrophils Relative %: 46.1 % (ref 43.0–77.0)
Platelets: 333 10*3/uL (ref 150.0–400.0)
RBC: 4.61 Mil/uL (ref 3.87–5.11)
RDW: 13.6 % (ref 11.5–15.5)
WBC: 4.1 10*3/uL (ref 4.0–10.5)

## 2022-11-13 LAB — COMPREHENSIVE METABOLIC PANEL
ALT: 32 U/L (ref 0–35)
AST: 29 U/L (ref 0–37)
Albumin: 4.1 g/dL (ref 3.5–5.2)
Alkaline Phosphatase: 95 U/L (ref 39–117)
BUN: 10 mg/dL (ref 6–23)
CO2: 27 mEq/L (ref 19–32)
Calcium: 9.4 mg/dL (ref 8.4–10.5)
Chloride: 103 mEq/L (ref 96–112)
Creatinine, Ser: 0.75 mg/dL (ref 0.40–1.20)
GFR: 85.61 mL/min (ref >60.00)
Glucose, Bld: 78 mg/dL (ref 70–99)
Potassium: 4 mEq/L (ref 3.5–5.1)
Sodium: 141 mEq/L (ref 135–145)
Total Bilirubin: 0.3 mg/dL (ref 0.2–1.2)
Total Protein: 6.3 g/dL (ref 6.0–8.3)

## 2022-11-13 LAB — LIPID PANEL
Cholesterol: 119 mg/dL (ref 0–200)
HDL: 69 mg/dL (ref >39.00)
LDL Cholesterol: 35 mg/dL (ref 0–99)
NonHDL: 50.34
Total CHOL/HDL Ratio: 2
Triglycerides: 76 mg/dL (ref 0.0–149.0)
VLDL: 15.2 mg/dL (ref 0.0–40.0)

## 2022-11-13 LAB — VITAMIN D 25 HYDROXY (VIT D DEFICIENCY, FRACTURES): VITD: 51.65 ng/mL (ref 30.00–100.00)

## 2022-11-13 LAB — FOLATE: Folate: 9.5 ng/mL (ref >5.9)

## 2022-11-13 LAB — MICROALBUMIN / CREATININE URINE RATIO
Creatinine,U: 151.6 mg/dL
Microalb Creat Ratio: 0.5 mg/g (ref 0.0–30.0)
Microalb, Ur: <0.7 mg/dL (ref 0.0–1.9)

## 2022-11-13 LAB — VITAMIN B12: Vitamin B-12: 393 pg/mL (ref 211–911)

## 2022-11-13 LAB — MAGNESIUM: Magnesium: 2 mg/dL (ref 1.5–2.5)

## 2022-11-13 LAB — HEMOGLOBIN A1C: Hgb A1c MFr Bld: 5.8 % (ref 4.6–6.5)

## 2022-11-13 LAB — TSH: TSH: 0.53 u[IU]/mL (ref 0.35–5.50)

## 2022-11-13 NOTE — Patient Instructions (Addendum)
Vasomotor rhinitis   Preventive Care 58-62 Years Old, Female Preventive care refers to lifestyle choices and visits with your health care provider that can promote health and wellness. Preventive care visits are also called wellness exams. What can I expect for my preventive care visit? Counseling Your health care provider may ask you questions about your: Medical history, including: Past medical problems. Family medical history. Pregnancy history. Current health, including: Menstrual cycle. Method of birth control. Emotional well-being. Home life and relationship well-being. Sexual activity and sexual health. Lifestyle, including: Alcohol, nicotine or tobacco, and drug use. Access to firearms. Diet, exercise, and sleep habits. Work and work Astronomer. Sunscreen use. Safety issues such as seatbelt and bike helmet use. Physical exam Your health care provider will check your: Height and weight. These may be used to calculate your BMI (body mass index). BMI is a measurement that tells if you are at a healthy weight. Waist circumference. This measures the distance around your waistline. This measurement also tells if you are at a healthy weight and may help predict your risk of certain diseases, such as type 2 diabetes and high blood pressure. Heart rate and blood pressure. Body temperature. Skin for abnormal spots. What immunizations do I need?  Vaccines are usually given at various ages, according to a schedule. Your health care provider will recommend vaccines for you based on your age, medical history, and lifestyle or other factors, such as travel or where you work. What tests do I need? Screening Your health care provider may recommend screening tests for certain conditions. This may include: Lipid and cholesterol levels. Diabetes screening. This is done by checking your blood sugar (glucose) after you have not eaten for a while (fasting). Pelvic exam and Pap  test. Hepatitis B test. Hepatitis C test. HIV (human immunodeficiency virus) test. STI (sexually transmitted infection) testing, if you are at risk. Lung cancer screening. Colorectal cancer screening. Mammogram. Talk with your health care provider about when you should start having regular mammograms. This may depend on whether you have a family history of breast cancer. BRCA-related cancer screening. This may be done if you have a family history of breast, ovarian, tubal, or peritoneal cancers. Bone density scan. This is done to screen for osteoporosis. Talk with your health care provider about your test results, treatment options, and if necessary, the need for more tests. Follow these instructions at home: Eating and drinking  Eat a diet that includes fresh fruits and vegetables, whole grains, lean protein, and low-fat dairy products. Take vitamin and mineral supplements as recommended by your health care provider. Do not drink alcohol if: Your health care provider tells you not to drink. You are pregnant, may be pregnant, or are planning to become pregnant. If you drink alcohol: Limit how much you have to 0-1 drink a day. Know how much alcohol is in your drink. In the [place] one drink equals one 12 oz bottle of beer (355 mL), one 5 oz glass of wine (148 mL), or one 1 oz glass of hard liquor (44 mL). Lifestyle Brush your teeth every morning and night with fluoride toothpaste. Floss one time each day. Exercise for at least 30 minutes 5 or more days each week. Do not use any products that contain nicotine or tobacco. These products include cigarettes, chewing tobacco, and vaping devices, such as e-cigarettes. If you need help quitting, ask your health care provider. Do not use drugs. If you are sexually active, practice safe sex. Use a condom or other  form of protection to prevent STIs. If you do not wish to become pregnant, use a form of birth control. If you plan to become pregnant,  see your health care provider for a prepregnancy visit. Take aspirin only as told by your health care provider. Make sure that you understand how much to take and what form to take. Work with your health care provider to find out whether it is safe and beneficial for you to take aspirin daily. Find healthy ways to manage stress, such as: Meditation, yoga, or listening to music. Journaling. Talking to a trusted person. Spending time with friends and family. Minimize exposure to UV radiation to reduce your risk of skin cancer. Safety Always wear your seat belt while driving or riding in a vehicle. Do not drive: If you have been drinking alcohol. Do not ride with someone who has been drinking. When you are tired or distracted. While texting. If you have been using any mind-altering substances or drugs. Wear a helmet and other protective equipment during sports activities. If you have firearms in your house, make sure you follow all gun safety procedures. Seek help if you have been physically or sexually abused. What's next? Visit your health care provider once a year for an annual wellness visit. Ask your health care provider how often you should have your eyes and teeth checked. Stay up to date on all vaccines. This information is not intended to replace advice given to you by your health care provider. Make sure you discuss any questions you have with your health care provider. Document Revised: 10/06/2020 Document Reviewed: 10/06/2020 Elsevier Patient Education  2024 [employer].

## 2022-11-13 NOTE — Assessment & Plan Note (Signed)
Persistent in left ear. Referred to ENT for further evaluation

## 2022-11-13 NOTE — Assessment & Plan Note (Signed)
Has been seen by sports medicine and injections did not help   MRI: . Moderate tendinosis of the supraspinatus tendon with a full-thickness tear of the anterior half of the supraspinatus tendon and 11 mm of retraction as well as a partial-thickness articular surface tear more posteriorly. 2. Moderate tendinosis of the infraspinatus tendon. 3. Moderate tendinosis of the subscapularis tendon with subcortical reactive marrow edema in the lesser tuberosity. 4. Moderate tendinosis of the intra-articular portion of the long head of the biceps tendon. 5. Mild subacromial/subdeltoid bursitis.  Sees Dr Francena Hanly

## 2022-11-16 LAB — VITAMIN B1: Vitamin B1 (Thiamine): 8 nmol/L (ref 8–30)

## 2022-11-17 ENCOUNTER — Encounter (HOSPITAL_BASED_OUTPATIENT_CLINIC_OR_DEPARTMENT_OTHER): Payer: Self-pay | Admitting: *Deleted

## 2022-11-17 ENCOUNTER — Ambulatory Visit (HOSPITAL_BASED_OUTPATIENT_CLINIC_OR_DEPARTMENT_OTHER)
Admission: RE | Admit: 2022-11-17 | Discharge: 2022-11-17 | Disposition: A | Discharge status: Home / Self Care | Payer: Commercial Managed Care - PPO | Source: Ambulatory Visit | Attending: Family Medicine | Admitting: Family Medicine

## 2022-11-17 DIAGNOSIS — N200 Calculus of kidney: Secondary | ICD-10-CM | POA: Diagnosis not present

## 2022-11-17 DIAGNOSIS — R194 Change in bowel habit: Secondary | ICD-10-CM | POA: Diagnosis not present

## 2022-11-17 DIAGNOSIS — K5730 Diverticulosis of large intestine without perforation or abscess without bleeding: Secondary | ICD-10-CM | POA: Diagnosis not present

## 2022-11-17 DIAGNOSIS — R109 Unspecified abdominal pain: Secondary | ICD-10-CM | POA: Diagnosis not present

## 2022-11-17 DIAGNOSIS — R1032 Left lower quadrant pain: Secondary | ICD-10-CM | POA: Insufficient documentation

## 2022-11-17 MED ORDER — BARIUM SULFATE 2 % PO SUSP: 900.0000 mL | Freq: Once | ORAL | Status: DC

## 2022-11-17 MED ORDER — IOHEXOL 300 MG/ML  SOLN
100.0000 mL | Freq: Once | INTRAMUSCULAR | Status: AC | PRN
  Administered 2022-11-17: 100 mL via INTRAVENOUS

## 2022-11-19 ENCOUNTER — Other Ambulatory Visit (HOSPITAL_COMMUNITY): Payer: Self-pay | Admitting: Psychiatry

## 2022-11-20 ENCOUNTER — Encounter: Payer: Self-pay | Admitting: Family Medicine

## 2022-11-20 ENCOUNTER — Other Ambulatory Visit: Payer: Self-pay

## 2022-11-20 ENCOUNTER — Other Ambulatory Visit (HOSPITAL_COMMUNITY): Payer: Self-pay

## 2022-11-20 DIAGNOSIS — M7541 Impingement syndrome of right shoulder: Secondary | ICD-10-CM | POA: Diagnosis not present

## 2022-11-20 MED ORDER — BUSPIRONE HCL 7.5 MG PO TABS
7.5000 mg | ORAL_TABLET | Freq: Two times a day (BID) | ORAL | 1 refills | Status: DC
  Filled 2022-11-20: qty 60, 30d supply, fill #0
  Filled 2022-12-19: qty 60, 30d supply, fill #1

## 2022-11-20 NOTE — Telephone Encounter (Signed)
Forwarding to Dr. Georgina Snell as Juluis Rainier.

## 2022-11-24 ENCOUNTER — Other Ambulatory Visit (HOSPITAL_COMMUNITY): Payer: Self-pay

## 2022-11-24 ENCOUNTER — Encounter: Payer: Self-pay | Admitting: Family Medicine

## 2022-11-24 DIAGNOSIS — N200 Calculus of kidney: Secondary | ICD-10-CM | POA: Diagnosis not present

## 2022-11-28 ENCOUNTER — Encounter: Payer: Self-pay | Admitting: Cardiology

## 2022-12-04 ENCOUNTER — Other Ambulatory Visit: Payer: Self-pay

## 2022-12-04 NOTE — Progress Notes (Signed)
Sent message, via epic in basket, requesting orders in epic from surgeon.  

## 2022-12-08 NOTE — Progress Notes (Signed)
COVID Vaccine received:  []  No [x]  Yes Date of any COVID positive Test in last 90 days:  PCP - Danise Edge, MD  Cardiologist - Thomasene Ripple, DO Endocrinology- Verdis Frederickson, MD at [facility]  Chest x-ray - 02-27-2020  2v  Epic EKG -  02-17-2022  Epic Stress Test - Lexiscan  05-01-2019  Epic ECHO - 04-28-2019  Epic Cardiac Cath -   PCR screen: []  Ordered & Completed           []   No Order but Needs PROFEND           [x]   N/A for this surgery  Surgery Plan:  [x]  Ambulatory                            []  Outpatient in bed                            []  Admit  Anesthesia:    [x]  General  []  Spinal                           []   Choice []   MAC  Pacemaker / ICD device [x]  No []  Yes   Spinal Cord Stimulator:[x]  No []  Yes       History of Sleep Apnea? []  No [x]  Yes   CPAP used?- []  No [x]  Yes    Does the patient monitor blood sugar?          []  No []  Yes  []  N/A Last A1c was 5.8 (Normal) on 11-13-22 Patient has: []  NO Hx DM   [x]  Pre-DM                 []  DM1  []   DM2 Does patient have a [employer] or Dexacom? []  No [x]  Yes   Fasting Blood Sugar Ranges-  Checks Blood Sugar _____ times a day  Blood Thinner / Instructions:none Aspirin Instructions:  none  ERAS Protocol Ordered: []  No  [x]  Yes PRE-SURGERY []  ENSURE  [x]  G2  Patient is to be NPO after: 1200 noon  Comments:Per Dr. Rennis Chris 's order even thought this isn't a total or Reverse shoulder arthroplasty;  The patient was given Benzoyl peroxide Gel as ordered. Instruction regarding application starting 2 days prior to surgery was given and patient voiced understanding.      Activity level: Patient is able / unable to climb a flight of stairs without difficulty; []  No CP  []  No SOB, but would have ___   Patient can / can not perform ADLs without assistance.   Anesthesia review: HTN, Pre-DM, OSA-CPAP, s/p roux-en-y 2022, Raynaud's phenomenon GAD, glaucoma,   Patient denies shortness of breath, fever, cough and chest  pain at PAT appointment.  Patient verbalized understanding and agreement to the Pre-Surgical Instructions that were given to them at this PAT appointment. Patient was also educated of the need to review these PAT instructions again prior to her surgery.I reviewed the appropriate phone numbers to call if they have any and questions or concerns.

## 2022-12-08 NOTE — Patient Instructions (Addendum)
SURGICAL WAITING ROOM VISITATION Patients having surgery or a procedure may have no more than 2 support people in the waiting area - these visitors may rotate in the visitor waiting room.   Due to an increase in RSV and influenza rates and associated hospitalizations, children ages 49 and under may not visit patients in [facility] hospitals. If the patient needs to stay at the hospital during part of their recovery, the visitor guidelines for inpatient rooms apply.  PRE-OP VISITATION  Pre-op nurse will coordinate an appropriate time for 1 support person to accompany the patient in pre-op.  This support person may not rotate.  This visitor will be contacted when the time is appropriate for the visitor to come back in the pre-op area.  Please refer to the [facility] website for the visitor guidelines for Inpatients (after your surgery is over and you are in a regular room).  You are not required to quarantine at this time prior to your surgery. However, you must do this: Hand Hygiene often Do NOT share personal items Notify your provider if you are in close contact with someone who has COVID or you develop fever 100.4 or greater, new onset of sneezing, cough, sore throat, shortness of breath or body aches.  If you test positive for Covid or have been in contact with anyone that has tested positive in the last 10 days please notify you surgeon.    Your procedure is scheduled on:  Thursday  December 21, 2022   Report to [facility] Main Entrance: [place] entrance where the [employer] is available.   Report to admitting at: 12:30  PM  Call this number if you have any questions or problems the morning of surgery [phone]  Do not eat food after Midnight the night prior to your surgery/procedure.  After Midnight you may have the following liquids until  12:00  Noon DAY OF SURGERY  Clear Liquid Diet Water Black Coffee (sugar ok, NO MILK/CREAM OR CREAMERS)  Tea (sugar ok, NO  MILK/CREAM OR CREAMERS) regular and decaf                             Plain Jell-O  with no fruit (NO RED)                                           Fruit ices (not with fruit pulp, NO RED)                                     Popsicles (NO RED)                                                                  Juice: NO CITRUS JUICES: only apple, WHITE grape, WHITE cranberry Sports drinks like Gatorade or Powerade (NO RED)                    The day of surgery:  Drink ONE (1) Pre-Surgery  G2 at  1200  Noon the day of  surgery. Drink in one sitting. Do not sip.  This drink was given to you during your hospital pre-op appointment visit. Nothing else to drink after completing the Pre-Surgery G2 : No candy, chewing gum or throat lozenges.    FOLLOW  ANY ADDITIONAL PRE OP INSTRUCTIONS YOU RECEIVED FROM YOUR SURGEON'S OFFICE!!!   Oral Hygiene is also important to reduce your risk of infection.        Remember - BRUSH YOUR TEETH THE MORNING OF SURGERY WITH YOUR REGULAR TOOTHPASTE  Do NOT smoke after Midnight the night before surgery.   STOP TAKING all Vitamins, Herbs and supplements 1 week before your surgery.   REPATHA injection-  Arcabose- You may take this medication the day BEFORE surgery.  DO NOT take it on the day OF SURGERY.   Take ONLY these medicines the morning of surgery with A SIP OF WATER: Thyroid pill, Metoprolol, Buspirone (Buspar).  You may take Gabapentin and Tylenol if needed for pain.  You may uses your Lumigan Eye drops and your Albuterol Inhaler if needed.    If You have been diagnosed with Sleep Apnea - Bring CPAP mask and tubing day of surgery. We will provide you with a CPAP machine on the day of your surgery.                   You may not have any metal on your body including hair pins, jewelry, and body piercing  Do not wear make-up, lotions, powders, perfumes or deodorant  Do not wear nail polish including gel and S&S, artificial / acrylic nails, or any other  type of covering on natural nails including finger and toenails. If you have artificial nails, gel coating, etc., that needs to be removed by a nail salon, Please have this removed prior to surgery. Not doing so may mean that your surgery could be cancelled or delayed if the Surgeon or anesthesia staff feels like they are unable to monitor you safely.   Do not shave 48 hours prior to surgery to avoid nicks in your skin which may contribute to postoperative infections.    Contacts, Hearing Aids, dentures or bridgework may not be worn into surgery. DENTURES WILL BE REMOVED PRIOR TO SURGERY PLEASE DO NOT APPLY "Poly grip" OR ADHESIVES!!!  You may bring a small overnight bag with you on the day of surgery, only pack items that are not valuable. [facility] IS NOT RESPONSIBLE   FOR VALUABLES THAT ARE LOST OR STOLEN.   Patients discharged on the day of surgery will not be allowed to drive home.  Someone NEEDS to stay with you for the first 24 hours after anesthesia.  Do not bring your home medications to the hospital EXCEPT FOR YOUR ALBUTEROL INHALER. The Pharmacy will dispense medications listed on your medication list to you during your admission in the Hospital.   Please read over the following fact sheets you were given: IF YOU HAVE QUESTIONS ABOUT YOUR PRE-OP INSTRUCTIONS, PLEASE CALL [phone]   [facility] - Preparing for Surgery Before surgery, you can play an important role.  Because skin is not sterile, your skin needs to be as free of germs as possible.  You can reduce the number of germs on your skin by washing with CHG (chlorahexidine gluconate) soap before surgery.  CHG is an antiseptic cleaner which kills germs and bonds with the skin to continue killing germs even after washing. Please DO NOT use if you have an allergy to CHG or antibacterial  soaps.  If your skin becomes reddened/irritated stop using the CHG and inform your nurse when you arrive at Short Stay. Do not shave (including  legs and underarms) for at least 48 hours prior to the first CHG shower.  You may shave your face/neck.  Please follow these instructions carefully:  1.  Shower with CHG Soap the night before surgery and the  morning of surgery.  2.  If you choose to wash your hair, wash your hair first as usual with your normal  shampoo.  3.  After you shampoo, rinse your hair and body thoroughly to remove the shampoo.                             4.  Use CHG as you would any other liquid soap.  You can apply chg directly to the skin and wash.  Gently with a scrungie or clean washcloth.  5.  Apply the CHG Soap to your body ONLY FROM THE NECK DOWN.   Do not use on face/ open                           Wound or open sores. Avoid contact with eyes, ears mouth and genitals (private parts).                       Wash face,  Genitals (private parts) with your normal soap.             6.  Wash thoroughly, paying special attention to the area where your  surgery  will be performed.  7.  Thoroughly rinse your body with warm water from the neck down.  8.  DO NOT shower/wash with your normal soap after using and rinsing off the CHG Soap.            9.  Pat yourself dry with a clean towel.            10.  Wear clean pajamas.            11.  Place clean sheets on your bed the night of your first shower and do not  sleep with pets.  ON THE DAY OF SURGERY : Do not apply any lotions/deodorants the morning of surgery.  Please wear clean clothes to the hospital/surgery center.     Preparing for Total Shoulder Arthroplasty ================================================================= Please follow these instructions carefully, in addition to any other special Bathing information that was explained to you at the Presurgical Appointment:  BENZOYL PEROXIDE 5% GEL: Used to kill bacteria on the skin which could cause an infection at the surgery site.   Please do not use if you have an allergy to benzoyl peroxide. If your  skin becomes reddened/irritated stop using the benzoyl peroxide and inform your Doctor.   Starting two days before surgery, apply as follows:  1. Apply benzoyl peroxide gel in the morning and at night. Apply after taking a shower. If you are not taking a shower, clean entire shoulder front, back, and side, along with the armpit with a clean wet washcloth.  2. Place a quarter-sized dollop of the gel on your SHOULDER and rub in thoroughly, making sure to cover the front, back, and side of your shoulder, along with the armpit.    2 Days prior to Surgery        Tuesday  December 19, 2022 First Application  _______ Morning Second Application _______ Night  Day Before Surgery            Wednesday  December 20, 2022 First Application______ Morning  On the night before surgery, wash your entire body (except hair, face and private areas) with CHG Soap. THEN, rub in the LAST application of the Benzoyl Peroxide Gel on your shoulder.   3. On the Morning of Surgery wash your BODY AGAIN with CHG Soap (except hair, face and private areas)  4. DO NOT USE THE BENZOYL PEROXIDE GEL ON THE DAY OF YOUR SURGERY       FAILURE TO FOLLOW THESE INSTRUCTIONS MAY RESULT IN THE CANCELLATION OF YOUR SURGERY  PATIENT SIGNATURE_________________________________  NURSE SIGNATURE__________________________________  ________________________________________________________________________     [name]    An incentive spirometer is a tool that can help keep your lungs clear and active. This tool measures how well you are filling your lungs with each breath. Taking long deep breaths may help reverse or decrease the chance of developing breathing (pulmonary) problems (especially infection) following: A long period of time when you are unable to move or be active. BEFORE THE PROCEDURE  If the spirometer includes an indicator to show your best effort, your nurse or respiratory therapist will set it to a  desired goal. If possible, sit up straight or lean slightly forward. Try not to slouch. Hold the incentive spirometer in an upright position. INSTRUCTIONS FOR USE  Sit on the edge of your bed if possible, or sit up as far as you can in bed or on a chair. Hold the incentive spirometer in an upright position. Breathe out normally. Place the mouthpiece in your mouth and seal your lips tightly around it. Breathe in slowly and as deeply as possible, raising the piston or the ball toward the top of the column. Hold your breath for 3-5 seconds or for as long as possible. Allow the piston or ball to fall to the bottom of the column. Remove the mouthpiece from your mouth and breathe out normally. Rest for a few seconds and repeat Steps 1 through 7 at least 10 times every 1-2 hours when you are awake. Take your time and take a few normal breaths between deep breaths. The spirometer may include an indicator to show your best effort. Use the indicator as a goal to work toward during each repetition. After each set of 10 deep breaths, practice coughing to be sure your lungs are clear. If you have an incision (the cut made at the time of surgery), support your incision when coughing by placing a pillow or rolled up towels firmly against it. Once you are able to get out of bed, walk around indoors and cough well. You may stop using the incentive spirometer when instructed by your caregiver.  RISKS AND COMPLICATIONS Take your time so you do not get dizzy or light-headed. If you are in pain, you may need to take or ask for pain medication before doing incentive spirometry. It is harder to take a deep breath if you are having pain. AFTER USE Rest and breathe slowly and easily. It can be helpful to keep track of a log of your progress. Your caregiver can provide you with a simple table to help with this. If you are using the spirometer at home, follow these instructions: SEEK MEDICAL CARE IF:  You are having  difficultly using the spirometer. You have trouble using the spirometer as often as instructed. Your pain medication is not giving  enough relief while using the spirometer. You develop fever of 100.5 F (38.1 C) or higher.                                                                                                    SEEK IMMEDIATE MEDICAL CARE IF:  You cough up bloody sputum that had not been present before. You develop fever of 102 F (38.9 C) or greater. You develop worsening pain at or near the incision site. MAKE SURE YOU:  Understand these instructions. Will watch your condition. Will get help right away if you are not doing well or get worse. Document Released: 08/21/2006 Document Revised: 07/03/2011 Document Reviewed: 10/22/2006 [facility] Patient Information 2014 [place], [place].

## 2022-12-10 ENCOUNTER — Other Ambulatory Visit: Payer: Self-pay | Admitting: Family Medicine

## 2022-12-11 ENCOUNTER — Other Ambulatory Visit (HOSPITAL_COMMUNITY)
Admission: RE | Admit: 2022-12-11 | Discharge: 2022-12-11 | Disposition: A | Discharge status: Home / Self Care | Payer: Commercial Managed Care - PPO | Source: Ambulatory Visit | Attending: Oncology | Admitting: Oncology

## 2022-12-11 ENCOUNTER — Encounter (HOSPITAL_COMMUNITY)
Admission: RE | Admit: 2022-12-11 | Discharge: 2022-12-11 | Disposition: A | Discharge status: Home / Self Care | Payer: Commercial Managed Care - PPO | Source: Ambulatory Visit | Attending: Orthopedic Surgery | Admitting: Orthopedic Surgery

## 2022-12-11 ENCOUNTER — Other Ambulatory Visit (HOSPITAL_COMMUNITY): Payer: Self-pay

## 2022-12-11 ENCOUNTER — Other Ambulatory Visit: Payer: Self-pay

## 2022-12-11 ENCOUNTER — Encounter (HOSPITAL_COMMUNITY): Payer: Self-pay

## 2022-12-11 VITALS — BP 136/79 | HR 79 | Temp 98.5°F | Resp 18 | Ht 64.0 in | Wt 184.0 lb

## 2022-12-11 DIAGNOSIS — I10 Essential (primary) hypertension: Secondary | ICD-10-CM | POA: Insufficient documentation

## 2022-12-11 DIAGNOSIS — Z006 Encounter for examination for normal comparison and control in clinical research program: Secondary | ICD-10-CM

## 2022-12-11 DIAGNOSIS — Z01818 Encounter for other preprocedural examination: Secondary | ICD-10-CM

## 2022-12-11 DIAGNOSIS — Z01812 Encounter for preprocedural laboratory examination: Secondary | ICD-10-CM | POA: Insufficient documentation

## 2022-12-11 HISTORY — DX: Hypothyroidism, unspecified: E03.9

## 2022-12-11 HISTORY — DX: Personal history of urinary calculi: Z87.442

## 2022-12-11 LAB — CBC
HCT: 42.6 % (ref 36.0–46.0)
Hemoglobin: 13.8 g/dL (ref 12.0–15.0)
MCH: 29.9 pg (ref 26.0–34.0)
MCHC: 32.4 g/dL (ref 30.0–36.0)
MCV: 92.2 fL (ref 80.0–100.0)
Platelets: 304 10*3/uL (ref 150–400)
RBC: 4.62 MIL/uL (ref 3.87–5.11)
RDW: 12.4 % (ref 11.5–15.5)
WBC: 4.1 10*3/uL (ref 4.0–10.5)
nRBC: 0 % (ref 0.0–0.2)

## 2022-12-11 LAB — BASIC METABOLIC PANEL
Anion gap: 11 (ref 5–15)
BUN: 10 mg/dL (ref 8–23)
CO2: 25 mmol/L (ref 22–32)
Calcium: 9.1 mg/dL (ref 8.9–10.3)
Chloride: 103 mmol/L (ref 98–111)
Creatinine, Ser: 0.82 mg/dL (ref 0.44–1.00)
GFR, Estimated: >60 mL/min (ref >60)
Glucose, Bld: 88 mg/dL (ref 70–99)
Potassium: 3.8 mmol/L (ref 3.5–5.1)
Sodium: 139 mmol/L (ref 135–145)

## 2022-12-11 MED ORDER — SPIRONOLACTONE 50 MG PO TABS
50.0000 mg | ORAL_TABLET | Freq: Three times a day (TID) | ORAL | 3 refills | Status: DC
  Filled 2022-12-11: qty 270, 90d supply, fill #0
  Filled 2023-03-09: qty 270, 90d supply, fill #1
  Filled 2023-06-25: qty 270, 90d supply, fill #2
  Filled 2023-09-20: qty 270, 90d supply, fill #3

## 2022-12-11 MED ORDER — THYROID 90 MG PO TABS
90.0000 mg | ORAL_TABLET | Freq: Every day | ORAL | 1 refills | Status: DC
  Filled 2022-12-11: qty 90, 90d supply, fill #0
  Filled 2023-03-09: qty 90, 90d supply, fill #1

## 2022-12-12 ENCOUNTER — Other Ambulatory Visit: Payer: Self-pay | Admitting: Family Medicine

## 2022-12-13 ENCOUNTER — Other Ambulatory Visit (HOSPITAL_COMMUNITY): Payer: Self-pay

## 2022-12-18 ENCOUNTER — Other Ambulatory Visit (HOSPITAL_COMMUNITY): Payer: Self-pay

## 2022-12-18 ENCOUNTER — Other Ambulatory Visit: Payer: Self-pay | Admitting: Family Medicine

## 2022-12-18 MED ORDER — FUROSEMIDE 20 MG PO TABS
20.0000 mg | ORAL_TABLET | Freq: Every day | ORAL | 0 refills | Status: DC | PRN
  Filled 2022-12-18: qty 90, 90d supply, fill #0

## 2022-12-20 ENCOUNTER — Other Ambulatory Visit: Payer: Self-pay | Admitting: Family Medicine

## 2022-12-20 ENCOUNTER — Other Ambulatory Visit (HOSPITAL_COMMUNITY): Payer: Self-pay

## 2022-12-20 MED ORDER — VITAMIN D (ERGOCALCIFEROL) 1.25 MG (50000 UNIT) PO CAPS
50000.0000 [IU] | ORAL_CAPSULE | ORAL | 1 refills | Status: DC
  Filled 2022-12-20: qty 12, 84d supply, fill #0
  Filled 2023-03-09: qty 12, 84d supply, fill #1

## 2022-12-21 ENCOUNTER — Encounter (HOSPITAL_COMMUNITY)
Admission: RE | Disposition: A | Discharge status: Home / Self Care | Payer: Self-pay | Source: Home / Self Care | Attending: Orthopedic Surgery

## 2022-12-21 ENCOUNTER — Ambulatory Visit (HOSPITAL_BASED_OUTPATIENT_CLINIC_OR_DEPARTMENT_OTHER): Payer: Commercial Managed Care - PPO | Admitting: Anesthesiology

## 2022-12-21 ENCOUNTER — Ambulatory Visit (HOSPITAL_COMMUNITY)
Admission: RE | Admit: 2022-12-21 | Discharge: 2022-12-21 | Disposition: A | Discharge status: Home / Self Care | Payer: Commercial Managed Care - PPO | Attending: Orthopedic Surgery | Admitting: Orthopedic Surgery

## 2022-12-21 ENCOUNTER — Other Ambulatory Visit (HOSPITAL_COMMUNITY): Payer: Self-pay

## 2022-12-21 ENCOUNTER — Other Ambulatory Visit: Payer: Self-pay

## 2022-12-21 ENCOUNTER — Encounter (HOSPITAL_COMMUNITY): Payer: Self-pay | Admitting: Orthopedic Surgery

## 2022-12-21 ENCOUNTER — Ambulatory Visit (HOSPITAL_COMMUNITY): Payer: Commercial Managed Care - PPO | Admitting: Anesthesiology

## 2022-12-21 DIAGNOSIS — M75121 Complete rotator cuff tear or rupture of right shoulder, not specified as traumatic: Secondary | ICD-10-CM | POA: Insufficient documentation

## 2022-12-21 DIAGNOSIS — M7541 Impingement syndrome of right shoulder: Secondary | ICD-10-CM | POA: Diagnosis not present

## 2022-12-21 DIAGNOSIS — M75101 Unspecified rotator cuff tear or rupture of right shoulder, not specified as traumatic: Secondary | ICD-10-CM

## 2022-12-21 DIAGNOSIS — Z6831 Body mass index (BMI) 31.0-31.9, adult: Secondary | ICD-10-CM | POA: Diagnosis not present

## 2022-12-21 DIAGNOSIS — M19011 Primary osteoarthritis, right shoulder: Secondary | ICD-10-CM | POA: Diagnosis not present

## 2022-12-21 DIAGNOSIS — Z87891 Personal history of nicotine dependence: Secondary | ICD-10-CM | POA: Insufficient documentation

## 2022-12-21 DIAGNOSIS — R0602 Shortness of breath: Secondary | ICD-10-CM | POA: Diagnosis not present

## 2022-12-21 DIAGNOSIS — I10 Essential (primary) hypertension: Secondary | ICD-10-CM | POA: Diagnosis not present

## 2022-12-21 DIAGNOSIS — S43431A Superior glenoid labrum lesion of right shoulder, initial encounter: Secondary | ICD-10-CM | POA: Diagnosis not present

## 2022-12-21 DIAGNOSIS — K219 Gastro-esophageal reflux disease without esophagitis: Secondary | ICD-10-CM | POA: Diagnosis not present

## 2022-12-21 DIAGNOSIS — I2510 Atherosclerotic heart disease of native coronary artery without angina pectoris: Secondary | ICD-10-CM | POA: Insufficient documentation

## 2022-12-21 DIAGNOSIS — E119 Type 2 diabetes mellitus without complications: Secondary | ICD-10-CM | POA: Insufficient documentation

## 2022-12-21 DIAGNOSIS — E039 Hypothyroidism, unspecified: Secondary | ICD-10-CM | POA: Diagnosis not present

## 2022-12-21 DIAGNOSIS — F32A Depression, unspecified: Secondary | ICD-10-CM | POA: Insufficient documentation

## 2022-12-21 DIAGNOSIS — F419 Anxiety disorder, unspecified: Secondary | ICD-10-CM | POA: Insufficient documentation

## 2022-12-21 DIAGNOSIS — M7581 Other shoulder lesions, right shoulder: Secondary | ICD-10-CM | POA: Diagnosis not present

## 2022-12-21 DIAGNOSIS — M65811 Other synovitis and tenosynovitis, right shoulder: Secondary | ICD-10-CM | POA: Diagnosis not present

## 2022-12-21 DIAGNOSIS — M7521 Bicipital tendinitis, right shoulder: Secondary | ICD-10-CM | POA: Diagnosis not present

## 2022-12-21 DIAGNOSIS — G8918 Other acute postprocedural pain: Secondary | ICD-10-CM | POA: Diagnosis not present

## 2022-12-21 DIAGNOSIS — G4733 Obstructive sleep apnea (adult) (pediatric): Secondary | ICD-10-CM | POA: Diagnosis not present

## 2022-12-21 DIAGNOSIS — J449 Chronic obstructive pulmonary disease, unspecified: Secondary | ICD-10-CM | POA: Insufficient documentation

## 2022-12-21 DIAGNOSIS — M797 Fibromyalgia: Secondary | ICD-10-CM | POA: Insufficient documentation

## 2022-12-21 HISTORY — PX: SHOULDER ARTHROSCOPY WITH ROTATOR CUFF REPAIR: SHX5685

## 2022-12-21 LAB — GLUCOSE, CAPILLARY: Glucose-Capillary: 88 mg/dL (ref 70–99)

## 2022-12-21 SURGERY — ARTHROSCOPY, SHOULDER, WITH ROTATOR CUFF REPAIR
Anesthesia: General | Site: Shoulder | Laterality: Right

## 2022-12-21 MED ORDER — ACETAMINOPHEN 500 MG PO TABS
ORAL_TABLET | ORAL | Status: AC
  Filled 2022-12-21: qty 2

## 2022-12-21 MED ORDER — ONDANSETRON HCL 4 MG PO TABS
4.0000 mg | ORAL_TABLET | Freq: Four times a day (QID) | ORAL | 0 refills | Status: DC | PRN
  Filled 2022-12-21: qty 10, 3d supply, fill #0

## 2022-12-21 MED ORDER — NAPROXEN 500 MG PO TABS
500.0000 mg | ORAL_TABLET | Freq: Two times a day (BID) | ORAL | 1 refills | Status: DC
  Filled 2022-12-21: qty 60, 30d supply, fill #0

## 2022-12-21 MED ORDER — LACTATED RINGERS IV SOLN: INTRAVENOUS | Status: DC

## 2022-12-21 MED ORDER — DEXAMETHASONE SODIUM PHOSPHATE 10 MG/ML IJ SOLN
INTRAMUSCULAR | Status: AC
  Filled 2022-12-21: qty 1

## 2022-12-21 MED ORDER — PHENYLEPHRINE HCL (PRESSORS) 10 MG/ML IV SOLN
INTRAVENOUS | Status: AC
  Filled 2022-12-21: qty 1

## 2022-12-21 MED ORDER — PROPOFOL 10 MG/ML IV BOLUS
INTRAVENOUS | Status: AC
  Filled 2022-12-21: qty 20

## 2022-12-21 MED ORDER — FENTANYL CITRATE (PF) 250 MCG/5ML IJ SOLN
INTRAMUSCULAR | Status: DC | PRN
  Administered 2022-12-21 (×2): 50 ug via INTRAVENOUS
  Administered 2022-12-21: 100 ug via INTRAVENOUS

## 2022-12-21 MED ORDER — FENTANYL CITRATE PF 50 MCG/ML IJ SOSY
100.0000 ug | PREFILLED_SYRINGE | Freq: Once | INTRAMUSCULAR | Status: AC
  Administered 2022-12-21: 50 ug via INTRAVENOUS
  Filled 2022-12-21: qty 2

## 2022-12-21 MED ORDER — 0.9 % SODIUM CHLORIDE (POUR BTL) OPTIME
TOPICAL | Status: DC | PRN
Start: 2022-12-21 — End: 2022-12-21
  Administered 2022-12-21: 1000 mL

## 2022-12-21 MED ORDER — PROPOFOL 10 MG/ML IV BOLUS
INTRAVENOUS | Status: DC | PRN
  Administered 2022-12-21: 30 mg via INTRAVENOUS
  Administered 2022-12-21: 140 mg via INTRAVENOUS
  Administered 2022-12-21: 30 mg via INTRAVENOUS

## 2022-12-21 MED ORDER — MIDAZOLAM HCL 2 MG/2ML IJ SOLN
2.0000 mg | Freq: Once | INTRAMUSCULAR | Status: AC
  Administered 2022-12-21: 1 mg via INTRAVENOUS
  Filled 2022-12-21: qty 2

## 2022-12-21 MED ORDER — SUGAMMADEX SODIUM 200 MG/2ML IV SOLN
INTRAVENOUS | Status: DC | PRN
  Administered 2022-12-21: 200 mg via INTRAVENOUS

## 2022-12-21 MED ORDER — FENTANYL CITRATE (PF) 100 MCG/2ML IJ SOLN
INTRAMUSCULAR | Status: AC
  Filled 2022-12-21: qty 2

## 2022-12-21 MED ORDER — FENTANYL CITRATE PF 50 MCG/ML IJ SOSY
25.0000 ug | PREFILLED_SYRINGE | INTRAMUSCULAR | Status: DC | PRN
  Administered 2022-12-21: 25 ug via INTRAVENOUS

## 2022-12-21 MED ORDER — BUPIVACAINE LIPOSOME 1.3 % IJ SUSP
INTRAMUSCULAR | Status: DC | PRN
  Administered 2022-12-21: 10 mL via PERINEURAL

## 2022-12-21 MED ORDER — SODIUM CHLORIDE 0.9 % IR SOLN
Status: DC | PRN
  Administered 2022-12-21: 15000 mL

## 2022-12-21 MED ORDER — ONDANSETRON HCL 4 MG/2ML IJ SOLN
INTRAMUSCULAR | Status: DC | PRN
  Administered 2022-12-21: 4 mg via INTRAVENOUS

## 2022-12-21 MED ORDER — ACETAMINOPHEN 500 MG PO TABS
1000.0000 mg | ORAL_TABLET | Freq: Once | ORAL | Status: AC
  Administered 2022-12-21: 1000 mg via ORAL

## 2022-12-21 MED ORDER — OXYCODONE-ACETAMINOPHEN 5-325 MG PO TABS
1.0000 | ORAL_TABLET | ORAL | 0 refills | Status: DC | PRN
Start: 2022-12-21 — End: 2023-02-13
  Filled 2022-12-21: qty 20, 4d supply, fill #0

## 2022-12-21 MED ORDER — FENTANYL CITRATE PF 50 MCG/ML IJ SOSY
PREFILLED_SYRINGE | INTRAMUSCULAR | Status: AC
  Filled 2022-12-21: qty 1

## 2022-12-21 MED ORDER — ORAL CARE MOUTH RINSE: 15.0000 mL | Freq: Once | OROMUCOSAL | Status: AC

## 2022-12-21 MED ORDER — CHLORHEXIDINE GLUCONATE 0.12 % MT SOLN
15.0000 mL | Freq: Once | OROMUCOSAL | Status: AC
  Administered 2022-12-21: 15 mL via OROMUCOSAL

## 2022-12-21 MED ORDER — LIDOCAINE HCL (PF) 2 % IJ SOLN
INTRAMUSCULAR | Status: AC
  Filled 2022-12-21: qty 5

## 2022-12-21 MED ORDER — ROCURONIUM BROMIDE 10 MG/ML (PF) SYRINGE
PREFILLED_SYRINGE | INTRAVENOUS | Status: AC
  Filled 2022-12-21: qty 10

## 2022-12-21 MED ORDER — DEXAMETHASONE SODIUM PHOSPHATE 10 MG/ML IJ SOLN
INTRAMUSCULAR | Status: DC | PRN
  Administered 2022-12-21: 5 mg via INTRAVENOUS

## 2022-12-21 MED ORDER — ONDANSETRON HCL 4 MG/2ML IJ SOLN
INTRAMUSCULAR | Status: AC
  Filled 2022-12-21: qty 2

## 2022-12-21 MED ORDER — DROPERIDOL 2.5 MG/ML IJ SOLN: 0.6250 mg | Freq: Once | INTRAMUSCULAR | Status: DC | PRN

## 2022-12-21 MED ORDER — CEFAZOLIN SODIUM-DEXTROSE 2-4 GM/100ML-% IV SOLN
2.0000 g | INTRAVENOUS | Status: AC
  Administered 2022-12-21: 2 g via INTRAVENOUS

## 2022-12-21 MED ORDER — CYCLOBENZAPRINE HCL 10 MG PO TABS
10.0000 mg | ORAL_TABLET | Freq: Four times a day (QID) | ORAL | 0 refills | Status: DC | PRN
  Filled 2022-12-21: qty 30, 10d supply, fill #0

## 2022-12-21 MED ORDER — LIDOCAINE 2% (20 MG/ML) 5 ML SYRINGE
INTRAMUSCULAR | Status: DC | PRN
  Administered 2022-12-21: 60 mg via INTRAVENOUS

## 2022-12-21 MED ORDER — BUPIVACAINE HCL (PF) 0.5 % IJ SOLN
INTRAMUSCULAR | Status: DC | PRN
  Administered 2022-12-21: 10 mL via PERINEURAL

## 2022-12-21 MED ORDER — ROCURONIUM BROMIDE 10 MG/ML (PF) SYRINGE
PREFILLED_SYRINGE | INTRAVENOUS | Status: DC | PRN
  Administered 2022-12-21: 20 mg via INTRAVENOUS
  Administered 2022-12-21: 60 mg via INTRAVENOUS

## 2022-12-21 MED ORDER — CEFAZOLIN SODIUM-DEXTROSE 2-4 GM/100ML-% IV SOLN
INTRAVENOUS | Status: AC
  Filled 2022-12-21: qty 100

## 2022-12-21 SURGICAL SUPPLY — 63 items
ANCH SUT 3.5 SELFPUNCH STRL (Anchor) ×1 IMPLANT
ANCH SUT SWLK 19.1X4.75 VT (Anchor) ×4 IMPLANT
ANCHOR PEEK 4.75X19.1 SWLK C (Anchor) IMPLANT
ANCHOR PSHLK BCMP PEK 3.5X19.5 (Anchor) IMPLANT
ANCHOR PUSHLOCK PEEK 3.5X19.5 (Anchor) ×1 IMPLANT
APL SKNCLS STERI-STRIP NONHPOA (GAUZE/BANDAGES/DRESSINGS) ×1
BAG COUNTER SPONGE SURGICOUNT (BAG) ×1 IMPLANT
BAG SPNG CNTER NS LX DISP (BAG) ×1
BENZOIN TINCTURE PRP APPL 2/3 (GAUZE/BANDAGES/DRESSINGS) IMPLANT
BLADE EXCALIBUR 4.0X13 (MISCELLANEOUS) ×1 IMPLANT
BOOTIES KNEE HIGH SLOAN (MISCELLANEOUS) ×2 IMPLANT
BURR OVAL 8 FLU 4.0X13 (MISCELLANEOUS) ×1 IMPLANT
CANNULA ACUFLEX KIT 5X76 (CANNULA) ×1 IMPLANT
CANNULA DRILOCK 5.0X75 (CANNULA) IMPLANT
CANNULA TWIST IN 8.25X7CM (CANNULA) IMPLANT
CONNECTOR 5 IN 1 STRAIGHT STRL (MISCELLANEOUS) ×1 IMPLANT
COOLER ICEMAN CLASSIC (MISCELLANEOUS) IMPLANT
DISSECTOR 3.8MM X 13CM (MISCELLANEOUS) ×1 IMPLANT
DRAPE INCISE 23X17 STRL (DRAPES) ×1 IMPLANT
DRAPE INCISE IOBAN 23X17 STRL (DRAPES) ×1
DRAPE INCISE IOBAN 66X45 STRL (DRAPES) ×1 IMPLANT
DRAPE ORTHO SPLIT 77X108 STRL (DRAPES)
DRAPE STERI 35X30 U-POUCH (DRAPES) ×1 IMPLANT
DRAPE SURG 17X11 SM STRL (DRAPES) ×1 IMPLANT
DRAPE SURG ORHT 6 SPLT 77X108 (DRAPES) IMPLANT
DRAPE U-SHAPE 47X51 STRL (DRAPES) ×1 IMPLANT
DURAPREP 26ML APPLICATOR (WOUND CARE) ×1 IMPLANT
GAUZE PAD ABD 8X10 STRL (GAUZE/BANDAGES/DRESSINGS) ×2 IMPLANT
GAUZE SPONGE 4X4 12PLY STRL (GAUZE/BANDAGES/DRESSINGS) ×1 IMPLANT
GLOVE BIO SURGEON STRL SZ7.5 (GLOVE) ×1 IMPLANT
GLOVE BIO SURGEON STRL SZ8 (GLOVE) ×1 IMPLANT
GLOVE SS BIOGEL STRL SZ 7 (GLOVE) ×3 IMPLANT
GLOVE SS BIOGEL STRL SZ 7.5 (GLOVE) ×1 IMPLANT
GOWN STRL SURGICAL XL XLNG (GOWN DISPOSABLE) ×2 IMPLANT
KIT BASIN OR (CUSTOM PROCEDURE TRAY) ×1 IMPLANT
KIT SHOULDER TRACTION (DRAPES) ×1 IMPLANT
KIT TURNOVER KIT A (KITS) IMPLANT
MANIFOLD NEPTUNE II (INSTRUMENTS) ×1 IMPLANT
NDL HD SCORPION MEGA LOADER (NEEDLE) IMPLANT
NDL SCORPION MULTI FIRE (NEEDLE) IMPLANT
NEEDLE SCORPION MULTI FIRE (NEEDLE)
NS IRRIG 1000ML POUR BTL (IV SOLUTION) ×1 IMPLANT
PACK ARTHROSCOPY WL (CUSTOM PROCEDURE TRAY) ×1 IMPLANT
PAD ARMBOARD 7.5X6 YLW CONV (MISCELLANEOUS) ×1 IMPLANT
PAD COLD SHLDR WRAP-ON (PAD) IMPLANT
PROBE APOLLO 90XL (SURGICAL WAND) ×1 IMPLANT
SLING ARM FOAM STRAP MED (SOFTGOODS) IMPLANT
SLING ULTRA III MED (ORTHOPEDIC SUPPLIES) IMPLANT
SPONGE T-LAP 4X18 ~~LOC~~+RFID (SPONGE) ×1 IMPLANT
STRIP CLOSURE SKIN 1/2X4 (GAUZE/BANDAGES/DRESSINGS) ×1 IMPLANT
SUT FIBERWIRE #2 38 T-5 BLUE (SUTURE)
SUT MNCRL AB 3-0 PS2 18 (SUTURE) ×1 IMPLANT
SUT PDS AB 0 CT 36 (SUTURE) IMPLANT
SUT PDS AB 1 CTX 36 (SUTURE) IMPLANT
SUT TIGER TAPE 7 IN WHITE (SUTURE) ×1 IMPLANT
SUTURE FIBERWR #2 38 T-5 BLUE (SUTURE) IMPLANT
TAPE FIBER 2MM 7IN #2 BLUE (SUTURE) IMPLANT
TAPE PAPER 3X10 WHT MICROPORE (GAUZE/BANDAGES/DRESSINGS) ×1 IMPLANT
TOWEL OR 17X26 10 PK STRL BLUE (TOWEL DISPOSABLE) ×1 IMPLANT
TUBING ARTHROSCOPY IRRIG 16FT (MISCELLANEOUS) ×1 IMPLANT
WAND ABLATOR APOLLO I90 (BUR) IMPLANT
WATER STERILE IRR 1000ML POUR (IV SOLUTION) ×1 IMPLANT
YANKAUER SUCT BULB TIP 10FT TU (MISCELLANEOUS) ×1 IMPLANT

## 2022-12-21 NOTE — Anesthesia Procedure Notes (Signed)
Procedure Name: Intubation Date/Time: 12/21/2022 2:52 PM  Performed by: Elyn Peers, CRNAPre-anesthesia Checklist: Patient identified, Emergency Drugs available, Suction available, Patient being monitored and Timeout performed Patient Re-evaluated:Patient Re-evaluated prior to induction Oxygen Delivery Method: Circle system utilized Preoxygenation: Pre-oxygenation with 100% oxygen Induction Type: IV induction Ventilation: Mask ventilation without difficulty Laryngoscope Size: Miller and 3 Grade View: Grade II Tube type: Oral Tube size: 7.0 mm Number of attempts: 1 Airway Equipment and Method: Stylet Placement Confirmation: ETT inserted through vocal cords under direct vision, positive ETCO2 and breath sounds checked- equal and bilateral Secured at: 23 cm Tube secured with: Tape Dental Injury: Teeth and Oropharynx as per pre-operative assessment

## 2022-12-21 NOTE — Anesthesia Postprocedure Evaluation (Signed)
Anesthesia Post Note  Patient: [name]  Procedure(s) Performed: Right shoulder arthroscopy, debridement, subacromial decompression, distal clavicle resection, rotator cuff repair (Right: Shoulder)     Patient location during evaluation: PACU Anesthesia Type: General Level of consciousness: sedated and patient cooperative Pain management: pain level controlled Vital Signs Assessment: post-procedure vital signs reviewed and stable Respiratory status: spontaneous breathing Cardiovascular status: stable Anesthetic complications: no   No notable events documented.  Last Vitals:  Vitals:   12/21/22 1730 12/21/22 1740  BP: (!) 140/86 (!) 141/83  Pulse: 77 82  Resp: 14   Temp:    SpO2: 97% 97%    Last Pain:  Vitals:   12/21/22 1740  TempSrc:   PainSc: 3                  [name]

## 2022-12-21 NOTE — Transfer of Care (Signed)
Immediate Anesthesia Transfer of Care Note  Patient: [name]  Procedure(s) Performed: Right shoulder arthroscopy, debridement, subacromial decompression, distal clavicle resection, rotator cuff repair (Right: Shoulder)  Patient Location: PACU  Anesthesia Type:GA combined with regional for post-op pain  Level of Consciousness: awake, drowsy, and responds to stimulation  Airway & Oxygen Therapy: Patient Spontanous Breathing and Patient connected to face mask oxygen  Post-op Assessment: Report given to RN and Post -op Vital signs reviewed and stable  Post vital signs: Reviewed and stable  Last Vitals:  Vitals Value Taken Time  BP 153/90 12/21/22 1636  Temp    Pulse 90 12/21/22 1638  Resp 16 12/21/22 1638  SpO2 100 % 12/21/22 1638  Vitals shown include unfiled device data.  Last Pain:  Vitals:   12/21/22 1420  TempSrc:   PainSc: 0-No pain         Complications: No notable events documented.

## 2022-12-21 NOTE — Anesthesia Procedure Notes (Signed)
Anesthesia Regional Block: Interscalene brachial plexus block   Pre-Anesthetic Checklist: , timeout performed,  Correct Patient, Correct Site, Correct Laterality,  Correct Procedure, Correct Position, site marked,  Risks and benefits discussed,  Surgical consent,  Pre-op evaluation,  At surgeon's request and post-op pain management  Laterality: Upper and Right  Prep: chloraprep       Needles:  Injection technique: Single-shot  Needle Type: Stimulator Needle - 40     Needle Length: 4cm  Needle Gauge: 22     Additional Needles:   Procedures:,,,, ultrasound used (permanent image in chart),,    Narrative:  Start time: 12/21/2022 1:46 PM End time: 12/21/2022 2:06 PM Injection made incrementally with aspirations every 5 mL.  Performed by: Personally  Anesthesiologist: [name], MD  Additional Notes: BP cuff, SpO2 and EKG monitors applied. Sedation begun. Nerve location verified with ultrasound. Anesthetic injected incrementally, slowly, and after neg aspirations under direct u/s guidance. Good perineural spread. Tolerated well.

## 2022-12-21 NOTE — Anesthesia Preprocedure Evaluation (Addendum)
Anesthesia Evaluation  Patient identified by MRN, date of birth, ID band Patient awake    Reviewed: Allergy & Precautions, NPO status , Patient's Chart, lab work & pertinent test results, reviewed documented beta blocker date and time   History of Anesthesia Complications Negative for: history of anesthetic complications  Airway Mallampati: II  TM Distance: >3 FB Neck ROM: Full    Dental  (+) Loose, Dental Advisory Given, Edentulous Upper, Missing,    Pulmonary shortness of breath, sleep apnea , COPD, former smoker   Pulmonary exam normal breath sounds clear to auscultation       Cardiovascular hypertension, Pt. on medications and Pt. on home beta blockers + CAD  Normal cardiovascular exam Rhythm:Regular Rate:Normal  Echo 2021  1. Left ventricular ejection fraction, by visual estimation, is 60 to 65%. The left ventricle has normal function. There is mildly increased left ventricular hypertrophy.   2. The left ventricle has no regional wall motion abnormalities.   3. Global right ventricle has normal systolic function.The right ventricular size is normal. No increase in right ventricular wall thickness.   4. Left atrial size was normal.   5. Right atrial size was normal.   6. The mitral valve is normal in structure. No evidence of mitral valve regurgitation. No evidence of mitral stenosis.   7. The tricuspid valve is normal in structure.   8. The aortic valve is normal in structure. Aortic valve regurgitation is not visualized. No evidence of aortic valve sclerosis or stenosis.   9. The pulmonic valve was normal in structure. Pulmonic valve regurgitation is not visualized.  10. Unable to determine Pulmonary artery systolic pressure, No TR jet spectral display.     Neuro/Psych  PSYCHIATRIC DISORDERS Anxiety Depression       GI/Hepatic Neg liver ROS,GERD  Medicated and Controlled,,  Endo/Other  diabetes, Type 2Hypothyroidism   Morbid obesity  Renal/GU Renal disease     Musculoskeletal  (+) Arthritis ,  Fibromyalgia -  Abdominal   Peds  Hematology negative hematology ROS (+)   Anesthesia Other Findings  Coronary CT 05/04/20:  IMPRESSION: 1. Coronary calcium score of 0. This was 1st percentile for age, sex, and race matched control. 2. Normal coronary origin. Right dominance. 3. RCA is a large dominant artery that gives rise to PDA and PLA. There is a mild (25-49%) non-obstructive soft plaque in the proximal vessel, there is a mild (25-49%) non-obstructive soft plaque distal to a step artifact in the mid vessel. 4. CAD-RADS 2. Mild non-obstructive CAD (25-49%). Consider non-atherosclerotic causes of chest pain. Consider preventive therapy and risk factor modification. 5. Atypical pulmonary vein drainage (common left pulmonary vein) into the left atrium.  Echo 04/28/19: EF 60-65%, mild LVH, normal RV function, valves normal  Stress test 05/01/19:   Nuclear stress EF: 61%. No wall motion abnormalities  There was no ST segment deviation noted during stress.  This is a low risk study. No ischemia identified    Reproductive/Obstetrics                             Anesthesia Physical Anesthesia Plan  ASA: 3  Anesthesia Plan: General   Post-op Pain Management: Regional block* and Tylenol PO (pre-op)*   Induction: Intravenous  PONV Risk Score and Plan: 4 or greater and Ondansetron, Dexamethasone, Treatment may vary due to age or medical condition, Midazolam and Scopolamine patch - Pre-op  Airway Management Planned: Oral ETT and Video Laryngoscope Planned  Additional Equipment: None  Intra-op Plan:   Post-operative Plan: Extubation in OR  Informed Consent: I have reviewed the patients History and Physical, chart, labs and discussed the procedure including the risks, benefits and alternatives for the proposed anesthesia with the patient or authorized representative who has  indicated his/her understanding and acceptance.     Dental advisory given  Plan Discussed with: CRNA  Anesthesia Plan Comments:         Anesthesia Quick Evaluation

## 2022-12-21 NOTE — Discharge Instructions (Signed)
   Vania Rea. Supple, M.D., F.A.A.O.S. Orthopaedic Surgery Specializing in Arthroscopic and Reconstructive Surgery of the Shoulder [phone] [address] [place], [place] [address] - Fax [phone]  POST-OP SHOULDER ARTHROSCOPIC ROTATOR CUFF REPAIR AND BICEPS REPAIR INSTRUCTIONS  1. Call the office at [phone] to schedule your first post-op appointment 7-10 days from the date of your surgery.  2. Leave the steri-strips in place over your incisions when performing dressing changes and showering. You may remove your dressings and begin showering 72 hours from surgery. You can expect drainage that is clear to bloody in nature that occasionally will soak through your dressings. If this occurs go ahead and perform a dressing change. The drainage should lessen daily and when there is no drainage from your incisions feel free to go without a dressing.  3. Wear your sling/immobilizer at all times except to perform the exercises below or to occasionally let your arm dangle by your side to stretch your elbow. You also need to sleep in your sling immobilizer until instructed otherwise.  4. Range of motion to your wrist, and hand are encouraged 3-5 times daily. Try to keep elbow slightly bent at all times due to biceps repair. Exercise to your hand and fingers helps to reduce swelling you may experience.  5. Utilize ice to the shoulder 3-4 times minimum a day and additionally if you are experiencing pain.  6. You may one-armed drive when safely off of narcotics and muscle relaxants. You may use your hand that is in the sling to support the steering wheel only. However, should it be your right arm that is in the sling it is not to be used for gear shifting in a manual transmission.  7. Pain control following an exparel block  To help control your post-operative pain you received a nerve block  performed with Exparel which is a long acting anesthetic (numbing agent) which can provide  pain relief and sensations of numbness (and relief of pain) in the operative shoulder and arm for up to 3 days. Sometimes it provides mixed relief, meaning you may still have numbness in certain areas of the arm but can still be able to move  parts of that arm, hand, and fingers. We recommend that your prescribed pain medications  be used as needed. We do not feel it is necessary to "pre medicate" and "stay ahead" of pain.  Taking narcotic pain medications when you are not having any pain can lead to unnecessary and potentially dangerous side effects.    8. Pain medications can produce constipation along with their use. If you experience this, the use of an over the counter stool softener or laxative daily is recommended.   9. For additional questions or concerns, please do not hesitate to call the office. If after hours there is an answering service to forward your concerns to the physician on call.  POST-OP EXERCISES  Pendulum Exercises  Perform pendulum exercises while standing and bending at the waist. Support your uninvolved arm on a table or chair and allow your operated arm to hang freely. Make sure to do these exercises passively - not using you shoulder muscle.  Repeat 20 times. Do 3 sessions per day.   WHEN DOING THESE EXERCISES KEEP ELBOW SLIGHTLY BENT AND AVOID FULLY EXTENDING ARM BECAUSE OF BICEPS REPAIR

## 2022-12-21 NOTE — Op Note (Signed)
12/21/2022  4:17 PM  PATIENT:   [name]  62 y.o. female  PRE-OPERATIVE DIAGNOSIS:  Right shoulder impingement, acromioclavicular osteoarthritis, rotator cuff tear  POST-OPERATIVE DIAGNOSIS: Same with additional finding of degenerative labral tear and severe biceps tendinopathy and degenerative partial tearing  PROCEDURE:  1.  Right shoulder examination under anesthesia  2.  Right shoulder glenohumeral joint diagnostic arthroscopy  3.  Debridement of extensive degenerative labral tear  4.  Arthroscopic subacromial decompression bursectomy  5.  Arthroscopic distal clavicle resection  6.  Arthroscopic bicep tendon tenodesis  7.  Arthroscopic rotator cuff repair using a double row suture bridge repair construct  SURGEON:  Lalana Wachter, Vania Rea M.D.  ASSISTANTS: Ralene Bathe, PA-C  Ralene Bathe, PA-C was utilized as an Geophysicist/field seismologist throughout this case, essential for help with positioning the patient, positioning extremity, tissue manipulation, implantation of the prosthesis, suture management, wound closure, and intraoperative decision-making.  ANESTHESIA:   General Endo Tracheal and interscalene block with Exparel  EBL: Minimal  SPECIMEN: None  Drains: None   PATIENT DISPOSITION:  PACU - hemodynamically stable.    PLAN OF CARE: Discharge to home after PACU  Brief history:  Patient is a 62 year old female with chronic and progressive increasing right shoulder pain related to impingement syndrome, symptomatic AC joint arthritis, and recent MRI scan demonstrating a full-thickness rotator cuff tear.  Due to her ongoing pain and functional limitations and failure to respond to prolonged attempts at conservative management, she is brought to the operating this time for planned right show arthroscopy as described below.  Preoperatively the patient was counseled regarding treatment options as well as potential risks versus benefits there.  Possible surgical complications  were reviewed including the potential for bleeding, infection, neurovascular injury, persistent pain, loss of motion, recurrence of rotator cuff tear, anesthetic complication, and possible need for additional surgery.  She understands, and accepts, and agrees with the planned procedure.  Procedure detail:  After undergoing routine preop evaluation the patient received prophylactic antibiotics and interscalene block with Exparel was established in the holding area by the anesthesia department.  Subsequently placed spine on the operating table and underwent the smooth induction of general endotracheal anesthesia.  Turned to the left lateral decubitus position on a beanbag and appropriately padded protected.  Right shoulder examination under anesthesia revealed full motion with no instability patterns noted.  Right arm was then suspended at 70 degrees of abduction with 15 pounds of traction in the right shoulder girdle region was sterilely prepped and draped in standard fashion.  Timeout was called.  A posterior portal established in the glenohumeral joint and intraportal established under direct visualization.  There was diffuse synovitis and the limited synovectomy was performed.  Articular surfaces were in good condition.  There is an obvious full-thickness tear of the entire distal supraspinatus and a limited debridement was performed from the articular side.  The long head biceps tendon showed severe fraying distally and with this we tagged the bicep with a PDS and then divided the bicep at the superior glenoid for later arthroscopic tenodesis.  There anterior and superior labral tearing which we debrided with shaver.  The glenohumeral joint was then terminally debrided.  No additional pathologies were identified.  Fluid and its was removed.  The arm was then dropped down to 30 degrees of abduction with the arthroscope introduced in the subacromial space of the posterior portal and a direct lateral portal  established in the subacromial space.  Abundant proliferative bursal tissue and multiple adhesions  were encountered and these were all divided and excised with a combination of the shaver and the Stryker wand.  The wand was then used to remove the periosteum from the undersurface of the anterior half of the acromion and a subacromial decompression was performed of the bradycardic With morphology.  A portal was then established directly anterior to the distal clavicle and the distal clavicle resection was performed with a bur.  Care was taken to confirm visualization of the entire circumference of the distal clavicle to ensure after removal of bone.  We then completed the subacromial/subdeltoid bursectomy.  The rotator cuff tear was readily identified and this is debrided back to a margin of healthy tissue with a shaver ultimately with the footprint approximately 2 and half centimeters in width.  We prepared the tuberosity and also abraded the bone at the apex of the bicipital groove for the biceps tenodesis.  The long head biceps tendon was then withdrawn up through the rotator cuff tear and a whipstitch of #2 FiberWire was passed using the scorpion suture passer.  We then utilized a push lock suture anchor to transfix the long of biceps tendon at the apex of the bicipital groove and the suture limbs were then trimmed and the proximal stump of biceps tendon was then removed with a shaver.  We then proceeded with a rotator cuff repair placing two 4.5 swivel lock peek suture anchors through establish on the lateral margin of the acromion at the edge of the osteoarticular margin equidistant across the width of the rotator cuff tear and these 8 suture limbs were then passed sequentially from anterior to posterior using the scorpion suture passer and then these were then passed in an alternating fashion into 2 lateral row swivel lock suture anchors allowing excellent reapposition of the margin of the rotator cuff tendon  to the bony bed of the tuberosity with the overall construct much for satisfaction.  All suture limbs were then clipped.  Bursectomy was completed.  Hemostasis was obtained.  Fluid and its was removed.  The portals were closed with a Monocryl and a Steri-Strip.  A dry dressing and tape about the right shoulder Reiners placed in a sling immobilizer and the patient was awakened, extubated, and taken to the recovery room in stable condition.  Senaida Lange MD   Contact # [phone]

## 2022-12-21 NOTE — H&P (Signed)
[name]    Chief Complaint: Right shoulder impingement, acromioclavicular osteoarthritis, rotator cuff tear HPI: The patient is a 62 y.o. female with chronic and progressively increasing right shoulder pain related to impingement syndrome, symptomatic AC joint arthritis, and recent MRI scan confirming a full-thickness rotator cuff tear.  Due to her ongoing pain and functional rotations she is brought to the operating this time for planned right shoulder arthroscopy with anticipated rotator cuff repair.  Past Medical History:  Diagnosis Date   Acute bronchitis 05/25/2016   Anxiety    Arthritis    Chronic pain syndrome 01/06/2013   Chronic rhinosinusitis    Colon polyps    Cough 01/06/2013   Depression    Diabetes (HCC) 01/06/2013   Diabetes mellitus type 2 in obese 01/06/2013   Sees Dr Nelson Chimes for eye exam Does not see podiatry, foot exam today unremarkable except for thick cracking skin on heals  pt denies    Dyspnea    with exertion    Dysuria 05/25/2016   Edema 01/06/2013   Epistaxis 05/25/2016   Fibroid, uterine    Fibromyalgia    GERD (gastroesophageal reflux disease)    Glaucoma 03/2012   History of kidney stones    Hoarseness of voice 05/11/2013   Hyperglycemia 04/13/2013   Hyperlipemia, mixed 06/06/2007   Qualifier: Diagnosis of  By: Nena Jordan She feels Lipitor caused increased low back pain and weakness     Hyperlipidemia    Hypertension    Hypokalemia 02/05/2013   Hypothyroidism    IBS (irritable bowel syndrome) 02/13/2011   Low back pain 03/03/2013   Muscle cramp 05/22/2014   Obesity    Obesity, unspecified 05/11/2013   OSA (obstructive sleep apnea)    cpap    Pain in joint, shoulder region 06/27/2015   Pre-diabetes    Raynaud disease 06/16/2013   Sleep apnea    Thyroid disease    hypothyroidism   Vocal fold nodules       Past Surgical History:  Procedure Laterality Date   ABDOMINAL HYSTERECTOMY     ANAL RECTAL MANOMETRY N/A  03/22/2022   Procedure: ANO RECTAL MANOMETRY;  Surgeon: Kerin Salen, MD;  Location: [facility];  Service: Gastroenterology;  Laterality: N/A;   BREAST EXCISIONAL BIOPSY Right    at age 39   BREAST SURGERY  lump remove breast   age 12 yrs   CHOLECYSTECTOMY     colonoscopoy      CYSTECTOMY     DILATION AND CURETTAGE OF UTERUS     x2   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   Procedure: LAPAROSCOPIC ROUX-EN-Y GASTRIC BYPASS WITH UPPER ENDOSCOPY;  Surgeon: Gaynelle Adu, MD;  Location: [facility];  Service: General;  Laterality: N/A;   HERNIA REPAIR  5/22   I & D EXTREMITY Left 04/11/2018   Procedure: DEBIDMENT DISTAL INTERPHALANGEAL LEFT MIDDLE;  Surgeon: Cindee Salt, MD;  Location: [facility];  Service: Orthopedics;  Laterality: Left;   INCISIONAL HERNIA REPAIR  08/10/2020   Procedure: LAPAROSCOPIC PRIMARY REPAIR OF INCISIONAL HERNIA;  Surgeon: Gaynelle Adu, MD;  Location: [facility];  Service: General;;   MASS EXCISION Left 04/11/2018   Procedure: EXCISION MASS;  Surgeon: Cindee Salt, MD;  Location: [facility];  Service: Orthopedics;  Laterality: Left;   NEPHROLITHOTOMY  10/14/2021   OOPHORECTOMY     POLYPECTOMY     ROTATOR CUFF REPAIR     UPPER GI ENDOSCOPY N/A 08/10/2020   Procedure: UPPER GI ENDOSCOPY;  Surgeon: Gaynelle Adu, MD;  Location: [facility];  Service: General;  Laterality: N/A;    Family History  Problem Relation Age of Onset   Alcohol abuse Father    Cancer Father        renal and colon   Hyperlipidemia Father    Hypertension Father    Stroke Father    Heart disease Father    Cancer Paternal Grandfather        colon   Diabetes Other    High blood pressure Mother    High Cholesterol Mother    Kidney disease Mother    Depression Mother    Anxiety disorder Mother    Obesity Mother    Hypertension Mother    Breast cancer Other 73   Diabetes Sister    Diabetes Brother    Arthritis Maternal Grandmother    Heart disease Paternal Grandmother      Social History:  reports that she quit smoking about 19 years ago. Her smoking use included cigarettes. She started smoking about 49 years ago. She has a 15 pack-year smoking history. She has never used smokeless tobacco. She reports that she does not drink alcohol and does not use drugs.  BMI: Estimated body mass index is 31.58 kg/m as calculated from the following:   Height as of 12/11/22: 5\' 4"  (1.626 m).   Weight as of 12/11/22: 83.5 kg.  Lab Results  Component Value Date   ALBUMIN 4.1 11/13/2022   Diabetes:   Patient has a diagnosis of diabetes,  Lab Results  Component Value Date   HGBA1C 5.8 11/13/2022   Smoking Status:       No medications prior to admission.     Physical Exam: Right shoulder demonstrates painful arc of motion with globally decreased strength is noted after recent office visits.  Positive impingement sign.  She is otherwise neurovascular intact in the right upper extremity.  Imaging studies confirmed good preservation of the glenohumeral joint space.  Advanced AC joint arthritis noted.  Recent MRI scan shows severe bony impingement, AC joint arthritis, and full-thickness rotator cuff tear.  Vitals     Assessment/Plan  Impression: Right shoulder impingement, acromioclavicular osteoarthritis, rotator cuff tear  Plan of Action: Procedure(s): Right shoulder arthroscopy, debridement, subacromial decompression, distal clavicle resection, rotator cuff repair  Cayman Brogden M Myeasha Ballowe 12/21/2022, 6:37 AM Contact # [phone]

## 2022-12-22 ENCOUNTER — Encounter: Payer: Self-pay | Admitting: Family Medicine

## 2022-12-22 ENCOUNTER — Other Ambulatory Visit (HOSPITAL_COMMUNITY): Payer: Self-pay

## 2022-12-22 ENCOUNTER — Encounter (HOSPITAL_COMMUNITY): Payer: Self-pay | Admitting: Orthopedic Surgery

## 2022-12-22 MED ORDER — METHOCARBAMOL 500 MG PO TABS
500.0000 mg | ORAL_TABLET | Freq: Three times a day (TID) | ORAL | 0 refills | Status: DC | PRN
  Filled 2022-12-22: qty 30, 10d supply, fill #0

## 2022-12-22 NOTE — Telephone Encounter (Signed)
We got it

## 2022-12-26 NOTE — Telephone Encounter (Signed)
Forwarding to Dr. Corey to review.  

## 2023-01-03 ENCOUNTER — Other Ambulatory Visit (HOSPITAL_COMMUNITY): Payer: Self-pay

## 2023-01-03 DIAGNOSIS — M25511 Pain in right shoulder: Secondary | ICD-10-CM | POA: Diagnosis not present

## 2023-01-05 ENCOUNTER — Telehealth (HOSPITAL_COMMUNITY): Payer: Commercial Managed Care - PPO | Admitting: Psychiatry

## 2023-01-05 ENCOUNTER — Encounter (HOSPITAL_COMMUNITY): Payer: Self-pay

## 2023-01-08 ENCOUNTER — Other Ambulatory Visit (HOSPITAL_COMMUNITY): Payer: Self-pay

## 2023-01-10 ENCOUNTER — Other Ambulatory Visit (HOSPITAL_COMMUNITY): Payer: Self-pay

## 2023-01-10 ENCOUNTER — Other Ambulatory Visit: Payer: Self-pay | Admitting: Internal Medicine

## 2023-01-10 DIAGNOSIS — I2510 Atherosclerotic heart disease of native coronary artery without angina pectoris: Secondary | ICD-10-CM

## 2023-01-10 DIAGNOSIS — E782 Mixed hyperlipidemia: Secondary | ICD-10-CM

## 2023-01-10 DIAGNOSIS — M25511 Pain in right shoulder: Secondary | ICD-10-CM | POA: Diagnosis not present

## 2023-01-10 MED ORDER — REPATHA SURECLICK 140 MG/ML ~~LOC~~ SOAJ
140.0000 mg | SUBCUTANEOUS | 3 refills | Status: DC
  Filled 2023-01-10: qty 6, 84d supply, fill #0
  Filled 2023-04-01: qty 6, 84d supply, fill #1
  Filled 2023-06-25: qty 6, 84d supply, fill #2
  Filled 2023-09-16: qty 6, 84d supply, fill #3

## 2023-01-11 ENCOUNTER — Other Ambulatory Visit (HOSPITAL_COMMUNITY): Payer: Self-pay

## 2023-01-15 ENCOUNTER — Other Ambulatory Visit (HOSPITAL_COMMUNITY): Payer: Self-pay | Admitting: Psychiatry

## 2023-01-15 ENCOUNTER — Other Ambulatory Visit (HOSPITAL_COMMUNITY): Payer: Self-pay

## 2023-01-15 MED ORDER — BUSPIRONE HCL 7.5 MG PO TABS
7.5000 mg | ORAL_TABLET | Freq: Two times a day (BID) | ORAL | 1 refills | Status: DC
  Filled 2023-01-15: qty 60, 30d supply, fill #0
  Filled 2023-02-17: qty 60, 30d supply, fill #1

## 2023-01-17 DIAGNOSIS — M25511 Pain in right shoulder: Secondary | ICD-10-CM | POA: Diagnosis not present

## 2023-01-21 DIAGNOSIS — G4733 Obstructive sleep apnea (adult) (pediatric): Secondary | ICD-10-CM | POA: Diagnosis not present

## 2023-01-21 DIAGNOSIS — I10 Essential (primary) hypertension: Secondary | ICD-10-CM | POA: Diagnosis not present

## 2023-01-22 ENCOUNTER — Other Ambulatory Visit (HOSPITAL_COMMUNITY): Payer: Self-pay

## 2023-01-22 ENCOUNTER — Other Ambulatory Visit: Payer: Self-pay

## 2023-01-24 DIAGNOSIS — M25511 Pain in right shoulder: Secondary | ICD-10-CM | POA: Diagnosis not present

## 2023-01-25 LAB — HELIX MOLECULAR SCREEN: Genetic Analysis Overall Interpretation: NEGATIVE

## 2023-01-29 ENCOUNTER — Encounter: Payer: Self-pay | Admitting: Family Medicine

## 2023-01-30 ENCOUNTER — Other Ambulatory Visit (HOSPITAL_COMMUNITY): Payer: Self-pay

## 2023-01-30 DIAGNOSIS — M25511 Pain in right shoulder: Secondary | ICD-10-CM | POA: Diagnosis not present

## 2023-02-05 ENCOUNTER — Encounter: Payer: Self-pay | Admitting: Cardiology

## 2023-02-06 DIAGNOSIS — M25511 Pain in right shoulder: Secondary | ICD-10-CM | POA: Diagnosis not present

## 2023-02-08 DIAGNOSIS — M25511 Pain in right shoulder: Secondary | ICD-10-CM | POA: Diagnosis not present

## 2023-02-11 DIAGNOSIS — I10 Essential (primary) hypertension: Secondary | ICD-10-CM | POA: Diagnosis not present

## 2023-02-11 DIAGNOSIS — G4733 Obstructive sleep apnea (adult) (pediatric): Secondary | ICD-10-CM | POA: Diagnosis not present

## 2023-02-11 NOTE — Assessment & Plan Note (Signed)
Supplement and monitor 

## 2023-02-11 NOTE — Assessment & Plan Note (Signed)
hgba1c acceptable, minimize simple carbs. Increase exercise as tolerated. Continue current meds 

## 2023-02-11 NOTE — Assessment & Plan Note (Signed)
On NP Thyroid and being followed by endocrinology

## 2023-02-11 NOTE — Assessment & Plan Note (Signed)
Encourage heart healthy diet such as MIND or DASH diet, increase exercise, avoid trans fats, simple carbohydrates and processed foods, consider a krill or fish or flaxseed oil cap daily.  °

## 2023-02-11 NOTE — Assessment & Plan Note (Signed)
Hydrate and monitor 

## 2023-02-11 NOTE — Assessment & Plan Note (Signed)
Well controlled, no changes to meds. Encouraged heart healthy diet such as the DASH diet and exercise as tolerated.  °

## 2023-02-11 NOTE — Assessment & Plan Note (Signed)
Does not tolerate statins.

## 2023-02-11 NOTE — Assessment & Plan Note (Signed)
Encouraged increased hydration and fiber in diet. Daily probiotics. If bowels not moving can use MOM 2 tbls po in 4 oz of warm prune juice by mouth every 2-3 days. If no results then repeat in 4 hours with  Dulcolax suppository pr, may repeat again in 4 more hours as needed. Seek care if symptoms worsen. Consider daily Miralax and/or Dulcolax if symptoms persist.  

## 2023-02-12 ENCOUNTER — Ambulatory Visit: Payer: Commercial Managed Care - PPO | Attending: Cardiology | Admitting: Cardiology

## 2023-02-12 ENCOUNTER — Encounter: Payer: Self-pay | Admitting: Cardiology

## 2023-02-12 VITALS — BP 124/82 | HR 81 | Ht 64.0 in | Wt 193.0 lb

## 2023-02-12 DIAGNOSIS — M25511 Pain in right shoulder: Secondary | ICD-10-CM | POA: Diagnosis not present

## 2023-02-12 DIAGNOSIS — I10 Essential (primary) hypertension: Secondary | ICD-10-CM

## 2023-02-12 DIAGNOSIS — E7801 Familial hypercholesterolemia: Secondary | ICD-10-CM | POA: Diagnosis not present

## 2023-02-12 NOTE — Progress Notes (Signed)
Cardiology Office Note:    Date:  02/12/2023   ID:  [name], DOB 04/27/1960, MRN [id number]  PCP:  Bradd Canary, MD  Cardiologist:  Thomasene Ripple, DO  Electrophysiologist:  None   Referring MD: Bradd Canary, MD   " I feel great"  History of Present Illness:    [name] is a 62 y.o. female with a hx of hypertension, prediabetes, hyperlipidemia with statin intolerance mild coronary artery disease seen on coronary CTA  has tolerated PCSK9 inhibitors is here today for follow-up visit.   At her last visit she was doing well from a CV standpoint.   She shared with me that she is experiencing some lightheadedness when she labs  Past Medical History:  Diagnosis Date   Acute bronchitis 05/25/2016   Anxiety    Arthritis    Chronic pain syndrome 01/06/2013   Chronic rhinosinusitis    Colon polyps    Cough 01/06/2013   Depression    Diabetes (HCC) 01/06/2013   Diabetes mellitus type 2 in obese 01/06/2013   Sees Dr Nelson Chimes for eye exam Does not see podiatry, foot exam today unremarkable except for thick cracking skin on heals  pt denies    Dyspnea    with exertion    Dysuria 05/25/2016   Edema 01/06/2013   Epistaxis 05/25/2016   Fibroid, uterine    Fibromyalgia    GERD (gastroesophageal reflux disease)    Glaucoma 03/2012   History of kidney stones    Hoarseness of voice 05/11/2013   Hyperglycemia 04/13/2013   Hyperlipemia, mixed 06/06/2007   Qualifier: Diagnosis of  By: Nena Jordan She feels Lipitor caused increased low back pain and weakness     Hyperlipidemia    Hypertension    Hypokalemia 02/05/2013   Hypothyroidism    IBS (irritable bowel syndrome) 02/13/2011   Low back pain 03/03/2013   Muscle cramp 05/22/2014   Obesity    Obesity, unspecified 05/11/2013   OSA (obstructive sleep apnea)    cpap    Pain in joint, shoulder region 06/27/2015   Pre-diabetes    Raynaud disease 06/16/2013   Sleep apnea    Thyroid disease     hypothyroidism   Vocal fold nodules     Past Surgical History:  Procedure Laterality Date   ABDOMINAL HYSTERECTOMY     ANAL RECTAL MANOMETRY N/A 03/22/2022   Procedure: ANO RECTAL MANOMETRY;  Surgeon: Kerin Salen, MD;  Location: [facility];  Service: Gastroenterology;  Laterality: N/A;   BREAST EXCISIONAL BIOPSY Right    at age 45   BREAST SURGERY  lump remove breast   age 57 yrs   CHOLECYSTECTOMY     colonoscopoy      CYSTECTOMY     DILATION AND CURETTAGE OF UTERUS     x2   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   Procedure: LAPAROSCOPIC ROUX-EN-Y GASTRIC BYPASS WITH UPPER ENDOSCOPY;  Surgeon: Gaynelle Adu, MD;  Location: [facility];  Service: General;  Laterality: N/A;   HERNIA REPAIR  5/22   I & D EXTREMITY Left 04/11/2018   Procedure: DEBIDMENT DISTAL INTERPHALANGEAL LEFT MIDDLE;  Surgeon: Cindee Salt, MD;  Location:  SUR[facility]ERY [facility];  Service: Orthopedics;  Laterality: Left;   INCISIONAL HERNIA REPAIR  08/10/2020   Procedure: LAPAROSCOPIC PRIMARY REPAIR OF INCISIONAL HERNIA;  Surgeon: Gaynelle Adu, MD;  Location: [facility];  Service: General;;   MASS EXCISION Left 04/11/2018   Procedure: EXCISION MASS;  Surgeon: Cindee Salt, MD;  Location: [facility];  Service: Orthopedics;  Laterality: Left;   NEPHROLITHOTOMY  10/14/2021   OOPHORECTOMY     POLYPECTOMY     ROTATOR CUFF REPAIR     SHOULDER ARTHROSCOPY WITH ROTATOR CUFF REPAIR Right 12/21/2022   Procedure: Right shoulder arthroscopy, debridement, subacromial decompression, distal clavicle resection, rotator cuff repair;  Surgeon: Francena Hanly, MD;  Location: [facility];  Service: Orthopedics;  Laterality: Right;   UPPER GI ENDOSCOPY N/A 08/10/2020   Procedure: UPPER GI ENDOSCOPY;  Surgeon: Gaynelle Adu, MD;  Location: [facility];  Service: General;  Laterality: N/A;    Current Medications: Current Meds  Medication Sig   acarbose (PRECOSE) 50 MG tablet Take 1 tablet (50 mg total) by mouth 3 (three) times daily with  meals.   acetaminophen (TYLENOL) 325 MG tablet Take 650 mg by mouth every 6 (six) hours as needed for moderate pain.   albuterol (VENTOLIN HFA) 108 (90 Base) MCG/ACT inhaler Inhale 2 puffs into the lungs every 4 (four) hours as needed for wheezing or shortness of breath.   bimatoprost (LUMIGAN) 0.01 % SOLN Place 1 drop into both eyes daily.   bisacodyl (DULCOLAX) 5 MG EC tablet Take 20 mg by mouth daily as needed for moderate constipation.   busPIRone (BUSPAR) 7.5 MG tablet Take 1 tablet (7.5 mg total) by mouth 2 (two) times daily.   Evolocumab (REPATHA SURECLICK) 140 MG/ML SOAJ Inject 140 mg into the skin every 14 (fourteen) days.   furosemide (LASIX) 20 MG tablet Take 1 tablet (20 mg total) by mouth daily as needed.   ipratropium (ATROVENT) 0.06 % nasal spray Place 2 sprays into both nostrils 3 (three) times daily as needed for rhinitis   loratadine (CLARITIN) 10 MG tablet Take 50 mg by mouth 3 (three) times a week. Thurs, Sat, and Sun   metoprolol tartrate (LOPRESSOR) 25 MG tablet Take 0.5 tablets (12.5 mg total) by mouth 2 (two) times daily.   Milnacipran (SAVELLA) 50 MG TABS tablet Take 1 tablet (50 mg total) by mouth 2 (two) times daily.   modafinil (PROVIGIL) 200 MG tablet Take 2 tablets (400 mg total) by mouth daily.   Pediatric Multivit-Minerals (FLINTSTONES COMPLETE) CHEW Chew 2 tablets by mouth daily.   spironolactone (ALDACTONE) 50 MG tablet Take 1 tablet (50 mg total) by mouth 3 (three) times daily.   thyroid (NP THYROID) 90 MG tablet Take 1 tablet (90 mg total) by mouth daily.   Vitamin D, Ergocalciferol, (DRISDOL) 1.25 MG (50000 UNIT) CAPS capsule Take 1 capsule (50,000 Units total) by mouth every 7 (seven) days.     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  Tobacco Use   Smoking status: Former    Current packs/day: 0.00    Average packs/day: 0.5 packs/day for 30.0 years (15.0 ttl pk-yrs)    Types: Cigarettes    Start date: 04/24/1973    Quit date: 04/25/2003    Years since quitting: 19.8   Smokeless tobacco: Never   Tobacco comments:    smoked since age 70  Vaping Use   Vaping status: Never Used  Substance and Sexual Activity   Alcohol use:  No   Drug use: Never   Sexual activity: Not Currently    Partners: Male    Birth control/protection: None  Other Topics Concern   Not on file  Social History Narrative   Endo-- Dr Lucianne Muss   ENT--Dr shoemaker   GI--Dr Buccini   Pulm--Dr Clance   Rheum--Dr Kellie Simmering         Social Determinants of Health   Financial Resource Strain: Low Risk  (02/06/2023)   Overall Financial Resource Strain (CARDIA)    Difficulty of Paying Living Expenses: Not hard at all  Food Insecurity: No Food Insecurity (02/06/2023)   Hunger Vital Sign    Worried About Running Out of Food in the Last Year: Never true    Ran Out of Food in the Last Year: Never true  Transportation Needs: No Transportation Needs (02/06/2023)   PRAPARE - Administrator, Civil Service (Medical): No    Lack of Transportation (Non-Medical): No  Physical Activity: Unknown (02/06/2023)   Exercise Vital Sign    Days of Exercise per Week: 3 days    Minutes of Exercise per Session: Patient declined  Stress: No Stress Concern Present (02/06/2023)   [employer] of Occupational Health - Occupational Stress Questionnaire    Feeling of Stress : Only a little  Social Connections: Moderately Integrated (02/06/2023)   Social Connection and Isolation Panel [NHANES]    Frequency of Communication with Friends and Family: Once a week    Frequency of Social Gatherings with  Friends and Family: More than three times a week    Attends Religious Services: Never    Database administrator or Organizations: Yes    Attends Banker Meetings: Never    Marital Status: Living with partner     Family History: The patient's family history includes Alcohol abuse in her father; Anxiety disorder in her mother; Arthritis in her maternal grandmother; Breast cancer (age of onset: 78) in an other family member; Cancer in her father and paternal grandfather; Depression in her mother; Diabetes in her brother, sister, and another family member; Heart disease in her father and paternal grandmother; High Cholesterol in her mother; High blood pressure in her mother; Hyperlipidemia in her father; Hypertension in her father and mother; Kidney disease in her mother; Obesity in her mother; Stroke in her father.  ROS:   Review of Systems  Constitution: Negative for decreased appetite, fever and weight gain.  HENT: Negative for congestion, ear discharge, hoarse voice and sore throat.   Eyes: Negative for discharge, redness, vision loss in right eye and visual halos.  Cardiovascular: Negative for chest pain, dyspnea on exertion, leg swelling, orthopnea and palpitations.  Respiratory: Negative for cough, hemoptysis, shortness of breath and snoring.   Endocrine: Negative for heat intolerance and polyphagia.  Hematologic/Lymphatic: Negative for bleeding problem. Does not bruise/bleed easily.  Skin: Negative for flushing, nail changes, rash and suspicious lesions.  Musculoskeletal: Negative for arthritis, joint pain, muscle cramps, myalgias, neck pain and stiffness.  Gastrointestinal: Negative for abdominal pain, bowel incontinence, diarrhea and excessive appetite.  Genitourinary: Negative for decreased libido, genital sores and incomplete emptying.  Neurological: Negative for brief paralysis, focal weakness, headaches and loss of balance.  Psychiatric/Behavioral: Negative for altered  mental status, depression and suicidal ideas.  Allergic/Immunologic: Negative for HIV exposure and persistent infections.    EKGs/Labs/Other Studies Reviewed:    The following studies were reviewed today:   EKG: Sinus rhythm, 81 bpm with marked sinus arrhythmia  Recent Labs:  11/13/2022: ALT 32; Magnesium 2.0; TSH 0.53 12/11/2022: BUN 10; Creatinine, Ser 0.82; Hemoglobin 13.8; Platelets 304; Potassium 3.8; Sodium 139  Recent Lipid Panel    Component Value Date/Time   CHOL 119 11/13/2022 1225   CHOL 77 (L) 10/11/2020 0816   TRIG 76.0 11/13/2022 1225   HDL 69.00 11/13/2022 1225   HDL 43 10/11/2020 0816   CHOLHDL 2 11/13/2022 1225   VLDL 15.2 11/13/2022 1225   LDLCALC 35 11/13/2022 1225   LDLCALC 17 10/11/2020 0816   LDLCALC 138 (H) 02/13/2020 0857   LDLDIRECT 159.0 11/11/2019 1125    Physical Exam:    VS:  BP 124/82 (BP Location: Left Arm, Patient Position: Sitting, Cuff Size: Normal)   Pulse 81   Ht 5\' 4"  (1.626 m)   Wt 193 lb (87.5 kg)   BMI 33.13 kg/m     Wt Readings from Last 3 Encounters:  02/12/23 193 lb (87.5 kg)  12/21/22 184 lb (83.5 kg)  12/11/22 184 lb (83.5 kg)     GEN: Well nourished, well developed in no acute distress HEENT: Normal NECK: No JVD; No carotid bruits LYMPHATICS: No lymphadenopathy CARDIAC: S1S2 noted,RRR, no murmurs, rubs, gallops RESPIRATORY:  Clear to auscultation without rales, wheezing or rhonchi  ABDOMEN: Soft, non-tender, non-distended, +bowel sounds, no guarding. EXTREMITIES: No edema, No cyanosis, no clubbing MUSCULOSKELETAL:  No deformity  SKIN: Warm and dry NEUROLOGIC:  Alert and oriented x 3, non-focal PSYCHIATRIC:  Normal affect, good insight  ASSESSMENT:    1. Hypertension, unspecified type   2. Familial hypercholesterolemia      PLAN:    From a cardiovascular standpoint she appears to be doing well.  She has familial hyperlipidemia we will continue patient on her current PCSK9 inhibitor.  She follows with our  lipid clinic.  Blood pressure is acceptable, continue with current antihypertensive regimen. Hyperlipidemia - continue with current statin medication.  The patient is in agreement with the above plan. The patient left the office in stable condition.  The patient will follow up in 1 year   Medication Adjustments/Labs and Tests Ordered: Current medicines are reviewed at length with the patient today.  Concerns regarding medicines are outlined above.  Orders Placed This Encounter  Procedures   EKG 12-Lead    No orders of the defined types were placed in this encounter.    Patient Instructions  Medication Instructions:  Your physician recommends that you continue on your current medications as directed. Please refer to the Current Medication list given to you today.  *If you need a refill on your cardiac medications before your next appointment, please call your pharmacy*   Lab Work: None   Testing/Procedures: None   Follow-Up: At [facility], you and your health needs are our priority.  As part of our continuing mission to provide you with exceptional heart care, we have created designated Provider Care Teams.  These Care Teams include your primary Cardiologist (physician) and Advanced Practice Providers (APPs -  Physician Assistants and Nurse Practitioners) who all work together to provide you with the care you need, when you need it.   Your next appointment:   1 year(s)  Provider:   Thomasene Ripple, DO     Adopting a Healthy Lifestyle.  Know what a healthy weight is for you (roughly BMI <25) and aim to maintain this   Aim for 7+ servings of fruits and vegetables daily   65-80+ fluid ounces of water or unsweet tea for healthy kidneys   Limit  to max 1 drink of alcohol per day; avoid smoking/tobacco   Limit animal fats in diet for cholesterol and heart health - choose grass fed whenever available   Avoid highly processed foods, and foods high in  saturated/trans fats   Aim for low stress - take time to unwind and care for your mental health   Aim for 150 min of moderate intensity exercise weekly for heart health, and weights twice weekly for bone health   Aim for 7-9 hours of sleep daily   When it comes to diets, agreement about the perfect plan isnt easy to find, even among the experts. Experts at the [facility] [employer] developed an idea known as the Healthy Eating Plate. Just imagine a plate divided into logical, healthy portions.   The emphasis is on diet quality:   Load up on vegetables and fruits - one-half of your plate: Aim for color and variety, and remember that potatoes dont count.   Go for whole grains - one-quarter of your plate: Whole wheat, barley, wheat berries, quinoa, oats, brown rice, and foods made with them. If you want pasta, go with whole wheat pasta.   Protein power - one-quarter of your plate: Fish, chicken, beans, and nuts are all healthy, versatile protein sources. Limit red meat.   The diet, however, does go beyond the plate, offering a few other suggestions.   Use healthy plant oils, such as olive, canola, soy, corn, sunflower and peanut. Check the labels, and avoid partially hydrogenated oil, which have unhealthy trans fats.   If youre thirsty, drink water. Coffee and tea are good in moderation, but skip sugary drinks and limit milk and dairy products to one or two daily servings.   The type of carbohydrate in the diet is more important than the amount. Some sources of carbohydrates, such as vegetables, fruits, whole grains, and beans-are healthier than others.   Finally, stay active  Signed, Thomasene Ripple, DO  02/12/2023 9:15 AM    [facility]

## 2023-02-12 NOTE — Patient Instructions (Signed)
Medication Instructions:  Your physician recommends that you continue on your current medications as directed. Please refer to the Current Medication list given to you today.  *If you need a refill on your cardiac medications before your next appointment, please call your pharmacy*   Lab Work: None   Testing/Procedures: None   Follow-Up: At [facility], you and your health needs are our priority.  As part of our continuing mission to provide you with exceptional heart care, we have created designated Provider Care Teams.  These Care Teams include your primary Cardiologist (physician) and Advanced Practice Providers (APPs -  Physician Assistants and Nurse Practitioners) who all work together to provide you with the care you need, when you need it.   Your next appointment:   1 year(s)  Provider:   Kardie Tobb, DO   

## 2023-02-13 ENCOUNTER — Ambulatory Visit (INDEPENDENT_AMBULATORY_CARE_PROVIDER_SITE_OTHER): Payer: Commercial Managed Care - PPO | Admitting: Family Medicine

## 2023-02-13 ENCOUNTER — Other Ambulatory Visit (HOSPITAL_COMMUNITY): Payer: Self-pay

## 2023-02-13 VITALS — BP 124/78 | HR 96 | Temp 98.0°F | Resp 18 | Ht 64.0 in | Wt 191.6 lb

## 2023-02-13 DIAGNOSIS — Z9884 Bariatric surgery status: Secondary | ICD-10-CM

## 2023-02-13 DIAGNOSIS — Z789 Other specified health status: Secondary | ICD-10-CM | POA: Diagnosis not present

## 2023-02-13 DIAGNOSIS — E1169 Type 2 diabetes mellitus with other specified complication: Secondary | ICD-10-CM | POA: Diagnosis not present

## 2023-02-13 DIAGNOSIS — K5904 Chronic idiopathic constipation: Secondary | ICD-10-CM | POA: Diagnosis not present

## 2023-02-13 DIAGNOSIS — E559 Vitamin D deficiency, unspecified: Secondary | ICD-10-CM | POA: Diagnosis not present

## 2023-02-13 DIAGNOSIS — R252 Cramp and spasm: Secondary | ICD-10-CM | POA: Diagnosis not present

## 2023-02-13 DIAGNOSIS — E782 Mixed hyperlipidemia: Secondary | ICD-10-CM | POA: Diagnosis not present

## 2023-02-13 DIAGNOSIS — E039 Hypothyroidism, unspecified: Secondary | ICD-10-CM | POA: Diagnosis not present

## 2023-02-13 DIAGNOSIS — E538 Deficiency of other specified B group vitamins: Secondary | ICD-10-CM

## 2023-02-13 DIAGNOSIS — I10 Essential (primary) hypertension: Secondary | ICD-10-CM

## 2023-02-13 DIAGNOSIS — E669 Obesity, unspecified: Secondary | ICD-10-CM | POA: Diagnosis not present

## 2023-02-13 DIAGNOSIS — E7801 Familial hypercholesterolemia: Secondary | ICD-10-CM

## 2023-02-13 DIAGNOSIS — Z23 Encounter for immunization: Secondary | ICD-10-CM

## 2023-02-13 MED ORDER — METOPROLOL TARTRATE 25 MG PO TABS
25.0000 mg | ORAL_TABLET | Freq: Two times a day (BID) | ORAL | 1 refills | Status: DC
Start: 2023-02-13 — End: 2023-08-06
  Filled 2023-02-13 (×2): qty 180, 90d supply, fill #0
  Filled 2023-05-10: qty 180, 90d supply, fill #1

## 2023-02-13 NOTE — Progress Notes (Signed)
Subjective:    Patient ID: [name], female    DOB: 1960-09-03, 62 y.o.   MRN: [id number]  Chief Complaint  Patient presents with  . Follow-up    HPI Discussed the use of AI scribe software for clinical note transcription with the patient, who gave verbal consent to proceed.  History of Present Illness   The patient, with a history of shoulder surgery, Raynaud's disease, alopecia, and kidney stones, presents with hyperhidrosis. The patient reports excessive sweating, which is causing distress and embarrassment. The patient does not identify any specific triggers for the sweating, but notes that it can occur during periods of nervousness or stress. The patient also mentions a persistent humming in the ear, which has not yet been evaluated. The patient has been managing these symptoms while recovering from shoulder surgery. The patient also reports a stone in the kidney, which was discovered during a visit to the urologist. The patient is a former smoker, having quit in 2007.        Past Medical History:  Diagnosis Date  . Acute bronchitis 05/25/2016  . Anxiety   . Arthritis   . Chronic pain syndrome 01/06/2013  . Chronic rhinosinusitis   . Colon polyps   . Cough 01/06/2013  . Depression   . Diabetes (HCC) 01/06/2013  . Diabetes mellitus type 2 in obese 01/06/2013   Sees Dr Nelson Chimes for eye exam Does not see podiatry, foot exam today unremarkable except for thick cracking skin on heals  pt denies   . Dyspnea    with exertion   . Dysuria 05/25/2016  . Edema 01/06/2013  . Epistaxis 05/25/2016  . Fibroid, uterine   . Fibromyalgia   . GERD (gastroesophageal reflux disease)   . Glaucoma 03/2012  . History of kidney stones   . Hoarseness of voice 05/11/2013  . Hyperglycemia 04/13/2013  . Hyperlipemia, mixed 06/06/2007   Qualifier: Diagnosis of  By: Nena Jordan She feels Lipitor caused increased low back pain and weakness    . Hyperlipidemia   . Hypertension    . Hypokalemia 02/05/2013  . Hypothyroidism   . IBS (irritable bowel syndrome) 02/13/2011  . Low back pain 03/03/2013  . Muscle cramp 05/22/2014  . Obesity   . Obesity, unspecified 05/11/2013  . OSA (obstructive sleep apnea)    cpap   . Pain in joint, shoulder region 06/27/2015  . Pre-diabetes   . Raynaud disease 06/16/2013  . Sleep apnea   . Thyroid disease    hypothyroidism  . Vocal fold nodules     Past Surgical History:  Procedure Laterality Date  . ABDOMINAL HYSTERECTOMY    . ANAL RECTAL MANOMETRY N/A 03/22/2022   Procedure: ANO RECTAL MANOMETRY;  Surgeon: Kerin Salen, MD;  Location: [facility];  Service: Gastroenterology;  Laterality: N/A;  . BREAST EXCISIONAL BIOPSY Right    at age 45  . BREAST SURGERY  lump remove breast   age 64 yrs  . CHOLECYSTECTOMY    . colonoscopoy     . CYSTECTOMY    . DILATION AND CURETTAGE OF UTERUS     x2  . 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   Procedure: LAPAROSCOPIC ROUX-EN-Y GASTRIC BYPASS WITH UPPER ENDOSCOPY;  Surgeon: Gaynelle Adu, MD;  Location: [facility];  Service: General;  Laterality: N/A;  . HERNIA REPAIR  5/22  . I & D EXTREMITY Left 04/11/2018   Procedure: DEBIDMENT DISTAL INTERPHALANGEAL LEFT MIDDLE;  Surgeon: Cindee Salt, MD;  Location: [facility];  Service: Orthopedics;  Laterality: Left;  . INCISIONAL HERNIA REPAIR  08/10/2020   Procedure: LAPAROSCOPIC PRIMARY REPAIR OF INCISIONAL HERNIA;  Surgeon: Gaynelle Adu, MD;  Location: [facility];  Service: General;;  . MASS EXCISION Left 04/11/2018   Procedure: EXCISION MASS;  Surgeon: Cindee Salt, MD;  Location: [facility];  Service: Orthopedics;  Laterality: Left;  . NEPHROLITHOTOMY  10/14/2021  . OOPHORECTOMY    . POLYPECTOMY    . ROTATOR CUFF REPAIR    . SHOULDER ARTHROSCOPY WITH ROTATOR CUFF REPAIR Right 12/21/2022   Procedure: Right shoulder arthroscopy, debridement, subacromial decompression, distal clavicle resection, rotator cuff repair;  Surgeon:  Francena Hanly, MD;  Location: [facility];  Service: Orthopedics;  Laterality: Right;  . UPPER GI ENDOSCOPY N/A 08/10/2020   Procedure: UPPER GI ENDOSCOPY;  Surgeon: Gaynelle Adu, MD;  Location: [facility];  Service: General;  Laterality: N/A;    Family History  Problem Relation Age of Onset  . Alcohol abuse Father   . Cancer Father        renal and colon  . Hyperlipidemia Father   . Hypertension Father   . Stroke Father   . Heart disease Father   . Cancer Paternal Grandfather        colon  . Diabetes Other   . High blood pressure Mother   . High Cholesterol Mother   . Kidney disease Mother   . Depression Mother   . Anxiety disorder Mother   . Obesity Mother   . Hypertension Mother   . Breast cancer Other 45  . Diabetes Sister   . Diabetes Brother   . Arthritis Maternal Grandmother   . Heart disease Paternal Grandmother     Social History   Socioeconomic History  . Marital status: Significant Other    Spouse name: Not on file  . Number of children: 0  . Years of education: Not on file  . Highest education level: 12th grade  Occupational History  . 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 Smoking status: Former    Current packs/day: 0.00    Average packs/day: 0.5 packs/day for 30.0 years (15.0 ttl pk-yrs)    Types: Cigarettes    Start date: 04/24/1973    Quit date: 04/25/2003    Years since quitting: 19.8  . Smokeless tobacco: Never  . Tobacco comments:    smoked since age 29  Vaping Use  . Vaping status: Never Used  Substance and Sexual Activity  . Alcohol use: No  . Drug use: Never  . Sexual activity: Not Currently    Partners: Male    Birth control/protection: None  Other Topics Concern  . Not on file  Social History Narrative   Endo-- Dr Lucianne Muss   ENT--Dr shoemaker   GI--Dr Buccini   Pulm--Dr Clance   Rheum--Dr Kellie Simmering         Social Determinants of Health   Financial Resource Strain: Low Risk  (02/06/2023)   Overall Financial Resource Strain  (CARDIA)   . Difficulty of Paying Living Expenses: Not hard at all  Food Insecurity: No Food Insecurity (02/06/2023)   Hunger Vital Sign   . Worried About Programme researcher, broadcasting/film/video in the Last Year: Never true   . Ran Out of Food in the Last Year: Never true  Transportation Needs: No Transportation Needs (02/06/2023)   PRAPARE - Transportation   . Lack of Transportation (Medical): No   . Lack of Transportation (Non-Medical): No  Physical Activity: Unknown (02/06/2023)  Exercise Vital Sign   . Days of Exercise per Week: 3 days   . Minutes of Exercise per Session: Patient declined  Stress: No Stress Concern Present (02/06/2023)   [employer] of Occupational Health - Occupational Stress Questionnaire   . Feeling of Stress : Only a little  Social Connections: Moderately Integrated (02/06/2023)   Social Connection and Isolation Panel [NHANES]   . Frequency of Communication with Friends and Family: Once a week   . Frequency of Social Gatherings with Friends and Family: More than three times a week   . Attends Religious Services: Never   . Active Member of Clubs or Organizations: Yes   . Attends Banker Meetings: Never   . Marital Status: Living with partner  Intimate Partner Violence: Not on file    Outpatient Medications Prior to Visit  Medication Sig Dispense Refill  . acarbose (PRECOSE) 50 MG tablet Take 1 tablet (50 mg total) by mouth 3 (three) times daily with meals. 90 tablet 11  . acetaminophen (TYLENOL) 325 MG tablet Take 650 mg by mouth every 6 (six) hours as needed for moderate pain.    Marland Kitchen albuterol (VENTOLIN HFA) 108 (90 Base) MCG/ACT inhaler Inhale 2 puffs into the lungs every 4 (four) hours as needed for wheezing or shortness of breath. 18 g 0  . bimatoprost (LUMIGAN) 0.01 % SOLN Place 1 drop into both eyes daily. 2.5 mL 12  . bisacodyl (DULCOLAX) 5 MG EC tablet Take 20 mg by mouth daily as needed for moderate constipation.    . busPIRone (BUSPAR) 7.5 MG tablet  Take 1 tablet (7.5 mg total) by mouth 2 (two) times daily. 60 tablet 1  . Evolocumab (REPATHA SURECLICK) 140 MG/ML SOAJ Inject 140 mg into the skin every 14 (fourteen) days. 6 mL 3  . furosemide (LASIX) 20 MG tablet Take 1 tablet (20 mg total) by mouth daily as needed. 90 tablet 0  . ipratropium (ATROVENT) 0.06 % nasal spray Place 2 sprays into both nostrils 3 (three) times daily as needed for rhinitis 15 mL 12  . loratadine (CLARITIN) 10 MG tablet Take 50 mg by mouth 3 (three) times a week. Thurs, Sat, and Sun    . Menthol, Topical Analgesic, (BIOFREEZE EX) Apply 1 Application topically 3 (three) times a week. Thurs, Sat, and Sun. Rub on shoulders    . Menthol, Topical Analgesic, (ICY HOT EX) Apply 1 Application topically 3 (three) times a week. Thurs, Sat, Sun. Rub on lower back    . Milnacipran (SAVELLA) 50 MG TABS tablet Take 1 tablet (50 mg total) by mouth 2 (two) times daily. 60 tablet 5  . modafinil (PROVIGIL) 200 MG tablet Take 2 tablets (400 mg total) by mouth daily. 60 tablet 5  . Pediatric Multivit-Minerals (FLINTSTONES COMPLETE) CHEW Chew 2 tablets by mouth daily.    Marland Kitchen spironolactone (ALDACTONE) 50 MG tablet Take 1 tablet (50 mg total) by mouth 3 (three) times daily. 270 tablet 3  . thyroid (NP THYROID) 90 MG tablet Take 1 tablet (90 mg total) by mouth daily. 90 tablet 1  . Vitamin D, Ergocalciferol, (DRISDOL) 1.25 MG (50000 UNIT) CAPS capsule Take 1 capsule (50,000 Units total) by mouth every 7 (seven) days. 12 capsule 1  . metoprolol tartrate (LOPRESSOR) 25 MG tablet Take 0.5 tablets (12.5 mg total) by mouth 2 (two) times daily. 90 tablet 1  . cyclobenzaprine (FLEXERIL) 10 MG tablet Take 1 tablet (10 mg total) by mouth every 6-8 hours as needed for spasm  30 tablet 0  . oxyCODONE-acetaminophen (PERCOCET) 5-325 MG tablet Take 1 tablet by mouth every 4-6 hours as needed for pain, no more than 6 tablets per day 20 tablet 0  . gabapentin (NEURONTIN) 300 MG capsule Take 1-2 capsules (300-600  mg total) by mouth every evening. (Patient taking differently: Take 600 mg by mouth daily as needed (pain).) 60 capsule 1  . methocarbamol (ROBAXIN) 500 MG tablet Take 1 tablet (500 mg total) by mouth every 8 (eight) hours as needed for spasm 30 tablet 0  . naproxen (NAPROSYN) 500 MG tablet Take 1 tablet (500 mg total) by mouth 2 (two) times daily after a meal. 60 tablet 1  . ondansetron (ZOFRAN) 4 MG tablet Take 1 tablet (4 mg total) by mouth every 6-8 hours as needed for post op nausea 10 tablet 0   No facility-administered medications prior to visit.    Allergies  Allergen Reactions  . Bupropion Other (See Comments)  . Crestor [Rosuvastatin] Other (See Comments)    Myalgias and rising CPK  . Losartan Cough  . Atorvastatin     myalgias  . Simvastatin     NDC Code:16714068101 NDC Code:16714068101 NDC ONGE:95284132440 Myalgias (Muscle Pain)  . Amoxicillin Rash  . Aspirin Nausea And Vomiting and Other (See Comments)    GI upset   . Codeine Rash  . Cyclobenzaprine     Patient does not tolerate  . Erythromycin Rash  . Penicillins Rash    Reaction: 15 years    Review of Systems  Constitutional:  Positive for malaise/fatigue. Negative for fever.  HENT:  Positive for tinnitus. Negative for congestion, ear discharge, ear pain and hearing loss.   Eyes:  Negative for blurred vision.  Respiratory:  Negative for shortness of breath.   Cardiovascular:  Negative for chest pain, palpitations and leg swelling.  Gastrointestinal:  Negative for abdominal pain, blood in stool and nausea.  Genitourinary:  Negative for dysuria and frequency.  Musculoskeletal:  Positive for joint pain. Negative for falls.  Skin:  Negative for rash.  Neurological:  Negative for dizziness, loss of consciousness and headaches.  Endo/Heme/Allergies:  Negative for environmental allergies.  Psychiatric/Behavioral:  Negative for depression. The patient is not nervous/anxious.        Objective:    Physical  Exam Constitutional:      General: She is not in acute distress.    Appearance: Normal appearance. She is well-developed. She is not toxic-appearing.  HENT:     Head: Normocephalic and atraumatic.     Right Ear: External ear normal.     Left Ear: External ear normal.     Nose: Nose normal.  Eyes:     General:        Right eye: No discharge.        Left eye: No discharge.     Conjunctiva/sclera: Conjunctivae normal.  Neck:     Thyroid: No thyromegaly.  Cardiovascular:     Rate and Rhythm: Normal rate and regular rhythm.     Heart sounds: Normal heart sounds. No murmur heard. Pulmonary:     Effort: Pulmonary effort is normal. No respiratory distress.     Breath sounds: Normal breath sounds.  Abdominal:     General: Bowel sounds are normal.     Palpations: Abdomen is soft.     Tenderness: There is no abdominal tenderness. There is no guarding.  Musculoskeletal:        General: Normal range of motion.     Cervical back:  Neck supple.  Lymphadenopathy:     Cervical: No cervical adenopathy.  Skin:    General: Skin is warm and dry.  Neurological:     Mental Status: She is alert and oriented to person, place, and time.  Psychiatric:        Mood and Affect: Mood normal.        Behavior: Behavior normal.        Thought Content: Thought content normal.        Judgment: Judgment normal.    BP 124/78 (BP Location: Left Arm, Patient Position: Sitting, Cuff Size: Normal)   Pulse 96   Temp 98 F (36.7 C) (Oral)   Resp 18   Ht 5\' 4"  (1.626 m)   Wt 191 lb 9.6 oz (86.9 kg)   SpO2 100%   BMI 32.89 kg/m  Wt Readings from Last 3 Encounters:  02/13/23 191 lb 9.6 oz (86.9 kg)  02/12/23 193 lb (87.5 kg)  12/21/22 184 lb (83.5 kg)    Diabetic Foot Exam - Simple   No data filed    Lab Results  Component Value Date   WBC 4.1 12/11/2022   HGB 13.8 12/11/2022   HCT 42.6 12/11/2022   PLT 304 12/11/2022   GLUCOSE 88 12/11/2022   CHOL 119 11/13/2022   TRIG 76.0 11/13/2022   HDL  69.00 11/13/2022   LDLDIRECT 159.0 11/11/2019   LDLCALC 35 11/13/2022   ALT 32 11/13/2022   AST 29 11/13/2022   NA 139 12/11/2022   K 3.8 12/11/2022   CL 103 12/11/2022   CREATININE 0.82 12/11/2022   BUN 10 12/11/2022   CO2 25 12/11/2022   TSH 0.53 11/13/2022   HGBA1C 5.8 11/13/2022   MICROALBUR <0.7 11/13/2022    Lab Results  Component Value Date   TSH 0.53 11/13/2022   Lab Results  Component Value Date   WBC 4.1 12/11/2022   HGB 13.8 12/11/2022   HCT 42.6 12/11/2022   MCV 92.2 12/11/2022   PLT 304 12/11/2022   Lab Results  Component Value Date   NA 139 12/11/2022   K 3.8 12/11/2022   CO2 25 12/11/2022   GLUCOSE 88 12/11/2022   BUN 10 12/11/2022   CREATININE 0.82 12/11/2022   BILITOT 0.3 11/13/2022   ALKPHOS 95 11/13/2022   AST 29 11/13/2022   ALT 32 11/13/2022   PROT 6.3 11/13/2022   ALBUMIN 4.1 11/13/2022   CALCIUM 9.1 12/11/2022   ANIONGAP 11 12/11/2022   EGFR 58 (L) 06/22/2020   GFR 85.61 11/13/2022   Lab Results  Component Value Date   CHOL 119 11/13/2022   Lab Results  Component Value Date   HDL 69.00 11/13/2022   Lab Results  Component Value Date   LDLCALC 35 11/13/2022   Lab Results  Component Value Date   TRIG 76.0 11/13/2022   Lab Results  Component Value Date   CHOLHDL 2 11/13/2022   Lab Results  Component Value Date   HGBA1C 5.8 11/13/2022       Assessment & Plan:  Chronic idiopathic constipation Assessment & Plan: Encouraged increased hydration and fiber in diet. Daily probiotics. If bowels not moving can use MOM 2 tbls po in 4 oz of warm prune juice by mouth every 2-3 days. If no results then repeat in 4 hours with  Dulcolax suppository pr, may repeat again in 4 more hours as needed. Seek care if symptoms worsen. Consider daily Miralax and/or Dulcolax if symptoms persist.    Orders: -  CBC with Differential/Platelet  Familial hypercholesterolemia Assessment & Plan: Encourage heart healthy diet such as MIND or DASH  diet, increase exercise, avoid trans fats, simple carbohydrates and processed foods, consider a krill or fish or flaxseed oil cap daily.    Orders: -     Comprehensive metabolic panel -     Lipid panel  Gastric bypass status for obesity Assessment & Plan: Hydrate and monitor    Hyperlipemia, mixed Assessment & Plan: Encourage heart healthy diet such as MIND or DASH diet, increase exercise, avoid trans fats, simple carbohydrates and processed foods, consider a krill or fish or flaxseed oil cap daily.    Hypertension, unspecified type Assessment & Plan: Well controlled, no changes to meds. Encouraged heart healthy diet such as the DASH diet and exercise as tolerated.    Orders: -     TSH  Hypothyroidism, unspecified type Assessment & Plan: On NP Thyroid and being followed by endocrinology   Muscle cramp Assessment & Plan: Hydrate and monitor   Orders: -     Magnesium  Statin intolerance Assessment & Plan: Does not tolerate statins   Type 2 diabetes mellitus with obesity (HCC) Assessment & Plan: hgba1c acceptable, minimize simple carbs. Increase exercise as tolerated. Continue current meds   Orders: -     Hemoglobin A1c  Vitamin B12 deficiency Assessment & Plan: Supplement and monitor   Orders: -     Vitamin B12  Vitamin D deficiency Assessment & Plan: Supplement and monitor   Orders: -     VITAMIN D 25 Hydroxy (Vit-D Deficiency, Fractures)  Need for influenza vaccination -     Flu vaccine trivalent PF, 6mos and older(Flulaval,Afluria,Fluarix,Fluzone)  Other orders -     Metoprolol Tartrate; Take 1 tablet (25 mg total) by mouth 2 (two) times daily.  Dispense: 180 tablet; Refill: 1    Assessment and Plan    Post-operative shoulder repair Patient is right-handed and has been using the right hand despite recent surgery. No numbness or weakness reported. -Encouraged to continue using left hand to allow right shoulder to heal.  Raynaud's  Phenomenon Noted worsening symptoms, particularly in fingers. No history of smoking since 2007. -Continue monitoring symptoms. -If symptoms worsen (pain becomes severe, fingers turn dark purple, blood flow does not return), consider referral to a vascular surgeon.  Hyperhidrosis Patient reports excessive sweating, particularly when nervous or stressed. Currently on Metoprolol 25mg  half a pill twice a day. -Increase Metoprolol to a full pill twice a day. -Monitor for side effects such as lightheadedness or low blood pressure.  Alopecia Patient reports hair loss. -Continue current management.  Kidney Stone Stone identified in kidney on previous imaging. -Monitor for symptoms.  General Health Maintenance -Administer influenza vaccine today. -Order labs including Vitamin D, B12, A1C, and Magnesium. -Follow-up appointment in 3-4 weeks.         Danise Edge, MD

## 2023-02-13 NOTE — Patient Instructions (Signed)
Hypertension, Adult High blood pressure (hypertension) is when the force of blood pumping through the arteries is too strong. The arteries are the blood vessels that carry blood from the heart throughout the body. Hypertension forces the heart to work harder to pump blood and may cause arteries to become narrow or stiff. Untreated or uncontrolled hypertension can lead to a heart attack, heart failure, a stroke, kidney disease, and other problems. A blood pressure reading consists of a higher number over a lower number. Ideally, your blood pressure should be below 120/80. The first ("top") number is called the systolic pressure. It is a measure of the pressure in your arteries as your heart beats. The second ("bottom") number is called the diastolic pressure. It is a measure of the pressure in your arteries as the heart relaxes. What are the causes? The exact cause of this condition is not known. There are some conditions that result in high blood pressure. What increases the risk? Certain factors may make you more likely to develop high blood pressure. Some of these risk factors are under your control, including: Smoking. Not getting enough exercise or physical activity. Being overweight. Having too much fat, sugar, calories, or salt (sodium) in your diet. Drinking too much alcohol. Other risk factors include: Having a personal history of heart disease, diabetes, high cholesterol, or kidney disease. Stress. Having a family history of high blood pressure and high cholesterol. Having obstructive sleep apnea. Age. The risk increases with age. What are the signs or symptoms? High blood pressure may not cause symptoms. Very high blood pressure (hypertensive crisis) may cause: Headache. Fast or irregular heartbeats (palpitations). Shortness of breath. Nosebleed. Nausea and vomiting. Vision changes. Severe chest pain, dizziness, and seizures. How is this diagnosed? This condition is diagnosed by  measuring your blood pressure while you are seated, with your arm resting on a flat surface, your legs uncrossed, and your feet flat on the floor. The cuff of the blood pressure monitor will be placed directly against the skin of your upper arm at the level of your heart. Blood pressure should be measured at least twice using the same arm. Certain conditions can cause a difference in blood pressure between your right and left arms. If you have a high blood pressure reading during one visit or you have normal blood pressure with other risk factors, you may be asked to: Return on a different day to have your blood pressure checked again. Monitor your blood pressure at home for 1 week or longer. If you are diagnosed with hypertension, you may have other blood or imaging tests to help your health care provider understand your overall risk for other conditions. How is this treated? This condition is treated by making healthy lifestyle changes, such as eating healthy foods, exercising more, and reducing your alcohol intake. You may be referred for counseling on a healthy diet and physical activity. Your health care provider may prescribe medicine if lifestyle changes are not enough to get your blood pressure under control and if: Your systolic blood pressure is above 130. Your diastolic blood pressure is above 80. Your personal target blood pressure may vary depending on your medical conditions, your age, and other factors. Follow these instructions at home: Eating and drinking  Eat a diet that is high in fiber and potassium, and low in sodium, added sugar, and fat. An example of this eating plan is called the DASH diet. DASH stands for Dietary Approaches to Stop Hypertension. To eat this way: Eat   plenty of fresh fruits and vegetables. Try to fill one half of your plate at each meal with fruits and vegetables. Eat whole grains, such as whole-wheat pasta, brown rice, or whole-grain bread. Fill about one  fourth of your plate with whole grains. Eat or drink low-fat dairy products, such as skim milk or low-fat yogurt. Avoid fatty cuts of meat, processed or cured meats, and poultry with skin. Fill about one fourth of your plate with lean proteins, such as fish, chicken without skin, beans, eggs, or tofu. Avoid pre-made and processed foods. These tend to be higher in sodium, added sugar, and fat. Reduce your daily sodium intake. Many people with hypertension should eat less than 1,500 mg of sodium a day. Do not drink alcohol if: Your health care provider tells you not to drink. You are pregnant, may be pregnant, or are planning to become pregnant. If you drink alcohol: Limit how much you have to: 0-1 drink a day for women. 0-2 drinks a day for men. Know how much alcohol is in your drink. In the [place] one drink equals one 12 oz bottle of beer (355 mL), one 5 oz glass of wine (148 mL), or one 1 oz glass of hard liquor (44 mL). Lifestyle  Work with your health care provider to maintain a healthy body weight or to lose weight. Ask what an ideal weight is for you. Get at least 30 minutes of exercise that causes your heart to beat faster (aerobic exercise) most days of the week. Activities may include walking, swimming, or biking. Include exercise to strengthen your muscles (resistance exercise), such as Pilates or lifting weights, as part of your weekly exercise routine. Try to do these types of exercises for 30 minutes at least 3 days a week. Do not use any products that contain nicotine or tobacco. These products include cigarettes, chewing tobacco, and vaping devices, such as e-cigarettes. If you need help quitting, ask your health care provider. Monitor your blood pressure at home as told by your health care provider. Keep all follow-up visits. This is important. Medicines Take over-the-counter and prescription medicines only as told by your health care provider. Follow directions carefully. Blood  pressure medicines must be taken as prescribed. Do not skip doses of blood pressure medicine. Doing this puts you at risk for problems and can make the medicine less effective. Ask your health care provider about side effects or reactions to medicines that you should watch for. Contact a health care provider if you: Think you are having a reaction to a medicine you are taking. Have headaches that keep coming back (recurring). Feel dizzy. Have swelling in your ankles. Have trouble with your vision. Get help right away if you: Develop a severe headache or confusion. Have unusual weakness or numbness. Feel faint. Have severe pain in your chest or abdomen. Vomit repeatedly. Have trouble breathing. These symptoms may be an emergency. Get help right away. Call 911. Do not wait to see if the symptoms will go away. Do not drive yourself to the hospital. Summary Hypertension is when the force of blood pumping through your arteries is too strong. If this condition is not controlled, it may put you at risk for serious complications. Your personal target blood pressure may vary depending on your medical conditions, your age, and other factors. For most people, a normal blood pressure is less than 120/80. Hypertension is treated with lifestyle changes, medicines, or a combination of both. Lifestyle changes include losing weight, eating a healthy,   low-sodium diet, exercising more, and limiting alcohol. This information is not intended to replace advice given to you by your health care provider. Make sure you discuss any questions you have with your health care provider. Document Revised: 02/15/2021 Document Reviewed: 02/15/2021 Elsevier Patient Education  2024 Elsevier Inc.  

## 2023-02-14 DIAGNOSIS — M25511 Pain in right shoulder: Secondary | ICD-10-CM | POA: Diagnosis not present

## 2023-02-14 LAB — CBC WITH DIFFERENTIAL/PLATELET
Basophils Absolute: 0.1 10*3/uL (ref 0.0–0.1)
Basophils Relative: 1.4 % (ref 0.0–3.0)
Eosinophils Absolute: 0.2 10*3/uL (ref 0.0–0.7)
Eosinophils Relative: 3.8 % (ref 0.0–5.0)
HCT: 43.3 % (ref 36.0–46.0)
Hemoglobin: 14 g/dL (ref 12.0–15.0)
Lymphocytes Relative: 44.5 % (ref 12.0–46.0)
Lymphs Abs: 1.9 10*3/uL (ref 0.7–4.0)
MCHC: 32.4 g/dL (ref 30.0–36.0)
MCV: 91.5 fL (ref 78.0–100.0)
Monocytes Absolute: 0.5 10*3/uL (ref 0.1–1.0)
Monocytes Relative: 11.3 % (ref 3.0–12.0)
Neutro Abs: 1.7 10*3/uL (ref 1.4–7.7)
Neutrophils Relative %: 39 % — ABNORMAL LOW (ref 43.0–77.0)
Platelets: 343 10*3/uL (ref 150.0–400.0)
RBC: 4.73 Mil/uL (ref 3.87–5.11)
RDW: 13.8 % (ref 11.5–15.5)
WBC: 4.4 10*3/uL (ref 4.0–10.5)

## 2023-02-14 LAB — COMPREHENSIVE METABOLIC PANEL
ALT: 19 U/L (ref 0–35)
AST: 22 U/L (ref 0–37)
Albumin: 4.4 g/dL (ref 3.5–5.2)
Alkaline Phosphatase: 84 U/L (ref 39–117)
BUN: 9 mg/dL (ref 6–23)
CO2: 28 meq/L (ref 19–32)
Calcium: 9.9 mg/dL (ref 8.4–10.5)
Chloride: 102 meq/L (ref 96–112)
Creatinine, Ser: 0.73 mg/dL (ref 0.40–1.20)
GFR: 88.27 mL/min (ref >60.00)
Glucose, Bld: 62 mg/dL — ABNORMAL LOW (ref 70–99)
Potassium: 4.1 meq/L (ref 3.5–5.1)
Sodium: 141 meq/L (ref 135–145)
Total Bilirubin: 0.2 mg/dL (ref 0.2–1.2)
Total Protein: 6.8 g/dL (ref 6.0–8.3)

## 2023-02-14 LAB — LIPID PANEL
Cholesterol: 112 mg/dL (ref 0–200)
HDL: 68.2 mg/dL (ref >39.00)
LDL Cholesterol: 17 mg/dL (ref 0–99)
NonHDL: 43.52
Total CHOL/HDL Ratio: 2
Triglycerides: 131 mg/dL (ref 0.0–149.0)
VLDL: 26.2 mg/dL (ref 0.0–40.0)

## 2023-02-14 LAB — VITAMIN B12: Vitamin B-12: 423 pg/mL (ref 211–911)

## 2023-02-14 LAB — HEMOGLOBIN A1C: Hgb A1c MFr Bld: 5.8 % (ref 4.6–6.5)

## 2023-02-14 LAB — MAGNESIUM: Magnesium: 2.2 mg/dL (ref 1.5–2.5)

## 2023-02-14 LAB — TSH: TSH: 0.34 u[IU]/mL — ABNORMAL LOW (ref 0.35–5.50)

## 2023-02-14 LAB — VITAMIN D 25 HYDROXY (VIT D DEFICIENCY, FRACTURES): VITD: 42.94 ng/mL (ref 30.00–100.00)

## 2023-02-19 ENCOUNTER — Other Ambulatory Visit (HOSPITAL_COMMUNITY): Payer: Self-pay

## 2023-02-19 ENCOUNTER — Other Ambulatory Visit: Payer: Self-pay | Admitting: Family Medicine

## 2023-02-19 MED ORDER — MODAFINIL 200 MG PO TABS
400.0000 mg | ORAL_TABLET | Freq: Every day | ORAL | 5 refills | Status: DC
Start: 2023-02-19 — End: 2023-08-20
  Filled 2023-02-19: qty 60, 30d supply, fill #0
  Filled 2023-04-28: qty 60, 30d supply, fill #1
  Filled 2023-04-30: qty 60, 30d supply, fill #0
  Filled 2023-06-25: qty 60, 30d supply, fill #1

## 2023-02-19 NOTE — Telephone Encounter (Signed)
Requesting: Provigil 200mg  Contract: N/A UDS: N/A Last Visit: 02/13/2023 Next Visit: 05/29/2023 Last Refill: 08/11/2022  Please Advise

## 2023-02-20 DIAGNOSIS — I10 Essential (primary) hypertension: Secondary | ICD-10-CM | POA: Diagnosis not present

## 2023-02-20 DIAGNOSIS — G4733 Obstructive sleep apnea (adult) (pediatric): Secondary | ICD-10-CM | POA: Diagnosis not present

## 2023-02-21 DIAGNOSIS — M25511 Pain in right shoulder: Secondary | ICD-10-CM | POA: Diagnosis not present

## 2023-02-22 DIAGNOSIS — E119 Type 2 diabetes mellitus without complications: Secondary | ICD-10-CM | POA: Diagnosis not present

## 2023-02-22 DIAGNOSIS — I7300 Raynaud's syndrome without gangrene: Secondary | ICD-10-CM | POA: Diagnosis not present

## 2023-02-22 DIAGNOSIS — E66811 Obesity, class 1: Secondary | ICD-10-CM | POA: Diagnosis not present

## 2023-02-22 DIAGNOSIS — E039 Hypothyroidism, unspecified: Secondary | ICD-10-CM | POA: Diagnosis not present

## 2023-02-22 DIAGNOSIS — Z9884 Bariatric surgery status: Secondary | ICD-10-CM | POA: Diagnosis not present

## 2023-02-22 DIAGNOSIS — E6609 Other obesity due to excess calories: Secondary | ICD-10-CM | POA: Diagnosis not present

## 2023-02-22 DIAGNOSIS — Z6830 Body mass index (BMI) 30.0-30.9, adult: Secondary | ICD-10-CM | POA: Diagnosis not present

## 2023-02-22 DIAGNOSIS — K5909 Other constipation: Secondary | ICD-10-CM | POA: Diagnosis not present

## 2023-02-22 DIAGNOSIS — Z8639 Personal history of other endocrine, nutritional and metabolic disease: Secondary | ICD-10-CM | POA: Diagnosis not present

## 2023-02-23 DIAGNOSIS — M25511 Pain in right shoulder: Secondary | ICD-10-CM | POA: Diagnosis not present

## 2023-02-26 ENCOUNTER — Other Ambulatory Visit (HOSPITAL_COMMUNITY): Payer: Self-pay

## 2023-02-26 ENCOUNTER — Other Ambulatory Visit: Payer: Self-pay

## 2023-02-27 ENCOUNTER — Other Ambulatory Visit (HOSPITAL_COMMUNITY): Payer: Self-pay

## 2023-02-27 DIAGNOSIS — M25511 Pain in right shoulder: Secondary | ICD-10-CM | POA: Diagnosis not present

## 2023-03-02 ENCOUNTER — Other Ambulatory Visit (HOSPITAL_COMMUNITY): Payer: Self-pay

## 2023-03-02 MED ORDER — BIMATOPROST 0.01 % OP SOLN
1.0000 [drp] | Freq: Every day | OPHTHALMIC | 12 refills | Status: DC
  Filled 2023-03-02: qty 2.5, 25d supply, fill #0
  Filled 2023-03-25: qty 2.5, 25d supply, fill #1
  Filled 2023-04-19: qty 2.5, 25d supply, fill #2
  Filled 2023-05-25: qty 2.5, 25d supply, fill #3
  Filled 2023-07-10: qty 2.5, 25d supply, fill #4

## 2023-03-05 DIAGNOSIS — E669 Obesity, unspecified: Secondary | ICD-10-CM | POA: Diagnosis not present

## 2023-03-06 DIAGNOSIS — M25511 Pain in right shoulder: Secondary | ICD-10-CM | POA: Diagnosis not present

## 2023-03-08 DIAGNOSIS — M25511 Pain in right shoulder: Secondary | ICD-10-CM | POA: Diagnosis not present

## 2023-03-09 ENCOUNTER — Other Ambulatory Visit: Payer: Self-pay | Admitting: Family Medicine

## 2023-03-09 DIAGNOSIS — L74511 Primary focal hyperhidrosis, face: Secondary | ICD-10-CM | POA: Diagnosis not present

## 2023-03-09 DIAGNOSIS — D2361 Other benign neoplasm of skin of right upper limb, including shoulder: Secondary | ICD-10-CM | POA: Diagnosis not present

## 2023-03-09 DIAGNOSIS — D2222 Melanocytic nevi of left ear and external auricular canal: Secondary | ICD-10-CM | POA: Diagnosis not present

## 2023-03-09 DIAGNOSIS — D1801 Hemangioma of skin and subcutaneous tissue: Secondary | ICD-10-CM | POA: Diagnosis not present

## 2023-03-09 DIAGNOSIS — D485 Neoplasm of uncertain behavior of skin: Secondary | ICD-10-CM | POA: Diagnosis not present

## 2023-03-09 DIAGNOSIS — L659 Nonscarring hair loss, unspecified: Secondary | ICD-10-CM | POA: Diagnosis not present

## 2023-03-09 DIAGNOSIS — M7130 Other bursal cyst, unspecified site: Secondary | ICD-10-CM | POA: Diagnosis not present

## 2023-03-10 ENCOUNTER — Other Ambulatory Visit (HOSPITAL_BASED_OUTPATIENT_CLINIC_OR_DEPARTMENT_OTHER): Payer: Self-pay

## 2023-03-13 DIAGNOSIS — M25511 Pain in right shoulder: Secondary | ICD-10-CM | POA: Diagnosis not present

## 2023-03-15 DIAGNOSIS — M25511 Pain in right shoulder: Secondary | ICD-10-CM | POA: Diagnosis not present

## 2023-03-20 DIAGNOSIS — M25511 Pain in right shoulder: Secondary | ICD-10-CM | POA: Diagnosis not present

## 2023-03-24 ENCOUNTER — Other Ambulatory Visit: Payer: Self-pay

## 2023-03-26 ENCOUNTER — Other Ambulatory Visit: Payer: Self-pay

## 2023-03-26 ENCOUNTER — Other Ambulatory Visit (HOSPITAL_COMMUNITY): Payer: Self-pay

## 2023-03-26 DIAGNOSIS — M25511 Pain in right shoulder: Secondary | ICD-10-CM | POA: Diagnosis not present

## 2023-03-27 DIAGNOSIS — H40013 Open angle with borderline findings, low risk, bilateral: Secondary | ICD-10-CM | POA: Diagnosis not present

## 2023-03-28 ENCOUNTER — Other Ambulatory Visit (HOSPITAL_COMMUNITY): Payer: Self-pay

## 2023-03-28 ENCOUNTER — Other Ambulatory Visit: Payer: Self-pay

## 2023-03-28 ENCOUNTER — Other Ambulatory Visit: Payer: Self-pay | Admitting: Family

## 2023-03-28 ENCOUNTER — Encounter: Payer: Self-pay | Admitting: Family Medicine

## 2023-03-28 DIAGNOSIS — M25511 Pain in right shoulder: Secondary | ICD-10-CM | POA: Diagnosis not present

## 2023-03-28 MED ORDER — SAVELLA 50 MG PO TABS
50.0000 mg | ORAL_TABLET | Freq: Two times a day (BID) | ORAL | 5 refills | Status: DC
  Filled 2023-03-28 – 2023-03-29 (×2): qty 60, 30d supply, fill #0
  Filled 2023-04-25: qty 60, 30d supply, fill #1
  Filled 2023-05-25: qty 60, 30d supply, fill #2
  Filled 2023-06-25: qty 60, 30d supply, fill #3
  Filled 2023-07-23: qty 60, 30d supply, fill #4
  Filled 2023-08-20: qty 60, 30d supply, fill #5

## 2023-03-29 ENCOUNTER — Other Ambulatory Visit: Payer: Self-pay

## 2023-04-01 ENCOUNTER — Other Ambulatory Visit (HOSPITAL_COMMUNITY): Payer: Self-pay | Admitting: Psychiatry

## 2023-04-02 ENCOUNTER — Other Ambulatory Visit (HOSPITAL_COMMUNITY): Payer: Self-pay

## 2023-04-02 DIAGNOSIS — K5904 Chronic idiopathic constipation: Secondary | ICD-10-CM | POA: Diagnosis not present

## 2023-04-02 DIAGNOSIS — M25511 Pain in right shoulder: Secondary | ICD-10-CM | POA: Diagnosis not present

## 2023-04-02 DIAGNOSIS — K5902 Outlet dysfunction constipation: Secondary | ICD-10-CM | POA: Diagnosis not present

## 2023-04-02 DIAGNOSIS — R140 Abdominal distension (gaseous): Secondary | ICD-10-CM | POA: Diagnosis not present

## 2023-04-02 MED ORDER — BUSPIRONE HCL 7.5 MG PO TABS
7.5000 mg | ORAL_TABLET | Freq: Two times a day (BID) | ORAL | 0 refills | Status: DC
  Filled 2023-04-02: qty 60, 30d supply, fill #0

## 2023-04-04 DIAGNOSIS — K912 Postsurgical malabsorption, not elsewhere classified: Secondary | ICD-10-CM | POA: Diagnosis not present

## 2023-04-04 DIAGNOSIS — M25511 Pain in right shoulder: Secondary | ICD-10-CM | POA: Diagnosis not present

## 2023-04-04 DIAGNOSIS — E042 Nontoxic multinodular goiter: Secondary | ICD-10-CM | POA: Diagnosis not present

## 2023-04-04 DIAGNOSIS — E039 Hypothyroidism, unspecified: Secondary | ICD-10-CM | POA: Diagnosis not present

## 2023-04-09 DIAGNOSIS — M25511 Pain in right shoulder: Secondary | ICD-10-CM | POA: Diagnosis not present

## 2023-04-11 DIAGNOSIS — M25511 Pain in right shoulder: Secondary | ICD-10-CM | POA: Diagnosis not present

## 2023-04-16 DIAGNOSIS — E042 Nontoxic multinodular goiter: Secondary | ICD-10-CM | POA: Diagnosis not present

## 2023-04-16 DIAGNOSIS — M25511 Pain in right shoulder: Secondary | ICD-10-CM | POA: Diagnosis not present

## 2023-04-20 DIAGNOSIS — M25511 Pain in right shoulder: Secondary | ICD-10-CM | POA: Diagnosis not present

## 2023-04-23 DIAGNOSIS — M25511 Pain in right shoulder: Secondary | ICD-10-CM | POA: Diagnosis not present

## 2023-04-27 ENCOUNTER — Encounter (HOSPITAL_COMMUNITY): Payer: Self-pay

## 2023-04-27 ENCOUNTER — Other Ambulatory Visit: Payer: Self-pay

## 2023-04-27 ENCOUNTER — Other Ambulatory Visit (HOSPITAL_COMMUNITY): Payer: Self-pay | Admitting: Psychiatry

## 2023-04-29 ENCOUNTER — Other Ambulatory Visit (HOSPITAL_COMMUNITY): Payer: Self-pay

## 2023-04-29 MED ORDER — BUSPIRONE HCL 7.5 MG PO TABS
7.5000 mg | ORAL_TABLET | Freq: Two times a day (BID) | ORAL | 0 refills | Status: DC
  Filled 2023-04-29: qty 60, 30d supply, fill #0

## 2023-04-30 ENCOUNTER — Other Ambulatory Visit (HOSPITAL_COMMUNITY): Payer: Self-pay

## 2023-04-30 ENCOUNTER — Other Ambulatory Visit: Payer: Self-pay | Admitting: Emergency Medicine

## 2023-04-30 ENCOUNTER — Other Ambulatory Visit: Payer: Self-pay | Admitting: Family Medicine

## 2023-04-30 ENCOUNTER — Encounter (HOSPITAL_COMMUNITY): Payer: Self-pay

## 2023-04-30 ENCOUNTER — Other Ambulatory Visit: Payer: Self-pay

## 2023-04-30 MED ORDER — FUROSEMIDE 20 MG PO TABS
20.0000 mg | ORAL_TABLET | Freq: Every day | ORAL | 0 refills | Status: DC | PRN
  Filled 2023-04-30: qty 90, 90d supply, fill #0

## 2023-04-30 MED ORDER — LORATADINE 10 MG PO TABS
5.0000 mg | ORAL_TABLET | Freq: Every day | ORAL | 3 refills | Status: DC | PRN
  Filled 2023-04-30: qty 60, 120d supply, fill #0
  Filled 2023-05-25 – 2023-06-04 (×2): qty 60, 120d supply, fill #1
  Filled 2023-07-10: qty 60, 60d supply, fill #2
  Filled 2023-08-20 – 2023-08-29 (×3): qty 60, 60d supply, fill #3

## 2023-05-21 ENCOUNTER — Telehealth (HOSPITAL_COMMUNITY): Payer: Commercial Managed Care - PPO | Admitting: Psychiatry

## 2023-05-21 ENCOUNTER — Other Ambulatory Visit (HOSPITAL_COMMUNITY): Payer: Self-pay

## 2023-05-21 DIAGNOSIS — F411 Generalized anxiety disorder: Secondary | ICD-10-CM

## 2023-05-21 DIAGNOSIS — M797 Fibromyalgia: Secondary | ICD-10-CM

## 2023-05-21 MED ORDER — BUSPIRONE HCL 7.5 MG PO TABS
7.5000 mg | ORAL_TABLET | Freq: Three times a day (TID) | ORAL | 0 refills | Status: DC
  Filled 2023-05-21 – 2023-05-25 (×4): qty 90, 30d supply, fill #0

## 2023-05-21 NOTE — Progress Notes (Signed)
[facility] Follow up visit  Patient Identification: [name] MRN:  [id number] Date of Evaluation:  05/21/2023 Referral Source: Primary care Chief Complaint:  follow up anxiety Visit Diagnosis:    ICD-10-CM   1. Generalized anxiety disorder  F41.1     2. Fibromyalgia  M79.7     3. GAD (generalized anxiety disorder)  F41.1     Virtual Visit via Video Note  I connected with [name] on 05/21/23 at  2:30 PM EST by a video enabled telemedicine application and verified that I am speaking with the correct person using two identifiers.  Location: Patient: home Provider: home office   I discussed the limitations of evaluation and management by telemedicine and the availability of in person appointments. The patient expressed understanding and agreed to proceed.       I discussed the assessment and treatment plan with the patient. The patient was provided an opportunity to ask questions and all were answered. The patient agreed with the plan and demonstrated an understanding of the instructions.   The patient was advised to call back or seek an in-person evaluation if the symptoms worsen or if the condition fails to improve as anticipated.  I provided 20 minutes of non-face-to-face time during this encounter.       History of Present Illness: Patient is a 63  years old female lives with her significant other initially referred by primary care physician for establish care for anxiety.  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  Does not have any kids  Job can be stressful  Has had surgery last year August recovering from shoulder Worried of job stress monitors heart rate remotely  Also on provigil, savella for tiredness  Has requested increase buspar for anxiety and that has helped   She is on buspar that helps anxiety to some extent   She follows with [facility] for therapy  She uses a CPAP machine at night  Aggravating factors;  job stress, brothers death Modifying factors; not that many Duration since adult life      Past Psychiatric History: anxiety  Previous Psychotropic Medications: Yes  Effexor, xanax  Past Medical History:  Past Medical History:  Diagnosis Date   Acute bronchitis 05/25/2016   Anxiety    Arthritis    Chronic pain syndrome 01/06/2013   Chronic rhinosinusitis    Colon polyps    Cough 01/06/2013   Depression    Diabetes (HCC) 01/06/2013   Diabetes mellitus type 2 in obese 01/06/2013   Sees Dr Nelson Chimes for eye exam Does not see podiatry, foot exam today unremarkable except for thick cracking skin on heals  pt denies    Dyspnea    with exertion    Dysuria 05/25/2016   Edema 01/06/2013   Epistaxis 05/25/2016   Fibroid, uterine    Fibromyalgia    GERD (gastroesophageal reflux disease)    Glaucoma 03/2012   History of kidney stones    Hoarseness of voice 05/11/2013   Hyperglycemia 04/13/2013   Hyperlipemia, mixed 06/06/2007   Qualifier: Diagnosis of  By: Nena Jordan She feels Lipitor caused increased low back pain and weakness     Hyperlipidemia    Hypertension    Hypokalemia 02/05/2013   Hypothyroidism    IBS (irritable bowel syndrome) 02/13/2011   Low back pain 03/03/2013   Muscle cramp 05/22/2014   Obesity    Obesity, unspecified 05/11/2013   OSA (obstructive sleep  apnea)    cpap    Pain in joint, shoulder region 06/27/2015   Pre-diabetes    Raynaud disease 06/16/2013   Sleep apnea    Thyroid disease    hypothyroidism   Vocal fold nodules     Past Surgical History:  Procedure Laterality Date   ABDOMINAL HYSTERECTOMY     ANAL RECTAL MANOMETRY N/A 03/22/2022   Procedure: ANO RECTAL MANOMETRY;  Surgeon: Kerin Salen, MD;  Location: [facility];  Service: Gastroenterology;  Laterality: N/A;   BREAST EXCISIONAL BIOPSY Right    at age 9   BREAST SURGERY  lump remove breast   age 56 yrs   CHOLECYSTECTOMY     colonoscopoy      CYSTECTOMY     DILATION  AND CURETTAGE OF UTERUS     x2   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   Procedure: LAPAROSCOPIC ROUX-EN-Y GASTRIC BYPASS WITH UPPER ENDOSCOPY;  Surgeon: Gaynelle Adu, MD;  Location: [facility];  Service: General;  Laterality: N/A;   HERNIA REPAIR  5/22   I & D EXTREMITY Left 04/11/2018   Procedure: DEBIDMENT DISTAL INTERPHALANGEAL LEFT MIDDLE;  Surgeon: Cindee Salt, MD;  Location: [facility];  Service: Orthopedics;  Laterality: Left;   INCISIONAL HERNIA REPAIR  08/10/2020   Procedure: LAPAROSCOPIC PRIMARY REPAIR OF INCISIONAL HERNIA;  Surgeon: Gaynelle Adu, MD;  Location: [facility];  Service: General;;   MASS EXCISION Left 04/11/2018   Procedure: EXCISION MASS;  Surgeon: Cindee Salt, MD;  Location: [facility];  Service: Orthopedics;  Laterality: Left;   NEPHROLITHOTOMY  10/14/2021   OOPHORECTOMY     POLYPECTOMY     ROTATOR CUFF REPAIR     SHOULDER ARTHROSCOPY WITH ROTATOR CUFF REPAIR Right 12/21/2022   Procedure: Right shoulder arthroscopy, debridement, subacromial decompression, distal clavicle resection, rotator cuff repair;  Surgeon: Francena Hanly, MD;  Location: [facility];  Service: Orthopedics;  Laterality: Right;   UPPER GI ENDOSCOPY N/A 08/10/2020   Procedure: UPPER GI ENDOSCOPY;  Surgeon: Gaynelle Adu, MD;  Location: [facility];  Service: General;  Laterality: N/A;    Family Psychiatric History: dad : alcohol use  Family History:  Family History  Problem Relation Age of Onset   Alcohol abuse Father    Cancer Father        renal and colon   Hyperlipidemia Father    Hypertension Father    Stroke Father    Heart disease Father    Cancer Paternal Grandfather        colon   Diabetes Other    High blood pressure Mother    High Cholesterol Mother    Kidney disease Mother    Depression Mother    Anxiety disorder Mother    Obesity Mother    Hypertension Mother    Breast cancer Other 35   Diabetes Sister    Diabetes Brother    Arthritis Maternal Grandmother     Heart disease Paternal Grandmother     Social History:   Social History   Socioeconomic History   Marital status: Significant Other    Spouse name: Not on file   Number of children: 0   Years of education: Not on file   Highest education level: 12th grade  Occupational History   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  Tobacco Use   Smoking status: Former    Current packs/day: 0.00    Average packs/day: 0.5 packs/day for 30.0 years (15.0 ttl pk-yrs)    Types: Cigarettes    Start  date: 04/24/1973    Quit date: 04/25/2003    Years since quitting: 20.0   Smokeless tobacco: Never   Tobacco comments:    smoked since age 22  Vaping Use   Vaping status: Never Used  Substance and Sexual Activity   Alcohol use: No   Drug use: Never   Sexual activity: Not Currently    Partners: Male    Birth control/protection: None  Other Topics Concern   Not on file  Social History Narrative   Endo-- Dr Lucianne Muss   ENT--Dr shoemaker   GI--Dr Buccini   Pulm--Dr Clance   Rheum--Dr Kellie Simmering         Social Drivers of Health   Financial Resource Strain: Low Risk  (05/21/2023)   Overall Financial Resource Strain (CARDIA)    Difficulty of Paying Living Expenses: Not hard at all  Food Insecurity: No Food Insecurity (05/21/2023)   Hunger Vital Sign    Worried About Running Out of Food in the Last Year: Never true    Ran Out of Food in the Last Year: Never true  Transportation Needs: No Transportation Needs (05/21/2023)   PRAPARE - Administrator, Civil Service (Medical): No    Lack of Transportation (Non-Medical): No  Physical Activity: Insufficiently Active (05/21/2023)   Exercise Vital Sign    Days of Exercise per Week: 3 days    Minutes of Exercise per Session: 30 min  Stress: No Stress Concern Present (05/21/2023)   [employer] of Occupational Health - Occupational Stress Questionnaire    Feeling of Stress : Only a little  Social Connections: Moderately Isolated (05/21/2023)   Social  Connection and Isolation Panel [NHANES]    Frequency of Communication with Friends and Family: Once a week    Frequency of Social Gatherings with Friends and Family: More than three times a week    Attends Religious Services: Never    Database administrator or Organizations: No    Attends Banker Meetings: Never    Marital Status: Living with partner      Allergies:   Allergies  Allergen Reactions   Bupropion Other (See Comments)   Crestor [Rosuvastatin] Other (See Comments)    Myalgias and rising CPK   Losartan Cough   Atorvastatin     myalgias   Simvastatin     NDC Code:16714068101 NDC Code:[id number] NDC ZOXW:[id number] Myalgias (Muscle Pain)   Amoxicillin Rash   Aspirin Nausea And Vomiting and Other (See Comments)    GI upset    Codeine Rash   Cyclobenzaprine     Patient does not tolerate   Erythromycin Rash   Penicillins Rash    Reaction: 15 years    Metabolic Disorder Labs: Lab Results  Component Value Date   HGBA1C 5.8 02/13/2023   MPG 122.63 08/03/2020   MPG 137 02/13/2020   No results found for: "PROLACTIN" Lab Results  Component Value Date   CHOL 112 02/13/2023   TRIG 131.0 02/13/2023   HDL 68.20 02/13/2023   CHOLHDL 2 02/13/2023   VLDL 26.2 02/13/2023   LDLCALC 17 02/13/2023   LDLCALC 35 11/13/2022   Lab Results  Component Value Date   TSH 0.34 (L) 02/13/2023    Therapeutic Level Labs: No results found for: "LITHIUM" No results found for: "CBMZ" No results found for: "VALPROATE"  Current Medications: Current Outpatient Medications  Medication Sig Dispense Refill   acarbose (PRECOSE) 50 MG tablet Take 1 tablet (50 mg total) by mouth 3 (three) times  daily with meals. 90 tablet 11   acetaminophen (TYLENOL) 325 MG tablet Take 650 mg by mouth every 6 (six) hours as needed for moderate pain.     albuterol (VENTOLIN HFA) 108 (90 Base) MCG/ACT inhaler Inhale 2 puffs into the lungs every 4 (four) hours as needed for wheezing or  shortness of breath. 18 g 0   bimatoprost (LUMIGAN) 0.01 % SOLN Place 1 drop into both eyes daily. 2.5 mL 12   bisacodyl (DULCOLAX) 5 MG EC tablet Take 20 mg by mouth daily as needed for moderate constipation.     busPIRone (BUSPAR) 7.5 MG tablet Take 1 tablet (7.5 mg total) by mouth 3 (three) times daily. 90 tablet 0   Evolocumab (REPATHA SURECLICK) 140 MG/ML SOAJ Inject 140 mg into the skin every 14 (fourteen) days. 6 mL 3   furosemide (LASIX) 20 MG tablet Take 1 tablet (20 mg total) by mouth daily as needed. 90 tablet 0   ipratropium (ATROVENT) 0.06 % nasal spray Place 2 sprays into both nostrils 3 (three) times daily as needed for rhinitis 15 mL 12   loratadine (CLARITIN) 10 MG tablet Take 0.5 tablets (5 mg total) by mouth daily as needed for allergies. Thurs, Sat, and Sun 60 tablet 3   Menthol, Topical Analgesic, (BIOFREEZE EX) Apply 1 Application topically 3 (three) times a week. Thurs, Sat, and Sun. Rub on shoulders     Menthol, Topical Analgesic, (ICY HOT EX) Apply 1 Application topically 3 (three) times a week. Thurs, Sat, Sun. Rub on lower back     metoprolol tartrate (LOPRESSOR) 25 MG tablet Take 1 tablet (25 mg total) by mouth 2 (two) times daily. 180 tablet 1   Milnacipran (SAVELLA) 50 MG TABS tablet Take 1 tablet (50 mg total) by mouth 2 (two) times daily. 60 tablet 5   modafinil (PROVIGIL) 200 MG tablet Take 2 tablets (400 mg total) by mouth daily. 60 tablet 5   Pediatric Multivit-Minerals (FLINTSTONES COMPLETE) CHEW Chew 2 tablets by mouth daily.     spironolactone (ALDACTONE) 50 MG tablet Take 1 tablet (50 mg total) by mouth 3 (three) times daily. 270 tablet 3   thyroid (NP THYROID) 90 MG tablet Take 1 tablet (90 mg total) by mouth daily. 90 tablet 1   Vitamin D, Ergocalciferol, (DRISDOL) 1.25 MG (50000 UNIT) CAPS capsule Take 1 capsule (50,000 Units total) by mouth every 7 (seven) days. 12 capsule 1   No current facility-administered medications for this visit.     Psychiatric  Specialty Exam: Review of Systems  Cardiovascular:  Negative for chest pain.  Psychiatric/Behavioral:  Negative for agitation and self-injury. The patient is nervous/anxious.     There were no vitals taken for this visit.There is no height or weight on file to calculate BMI.  General Appearance: Casual  Eye Contact:  Fair  Speech:  Slow  Volume:  Decreased  Mood:  somewhat stressed  Affect:  Constricted  Thought Process:  Goal Directed  Orientation:  Full (Time, Place, and Person)  Thought Content:  Rumination  Suicidal Thoughts:  No  Homicidal Thoughts:  No  Memory:  Immediate;   Fair Recent;   Fair  Judgement:  Fair  Insight:  Fair  Psychomotor Activity:  Decreased  Concentration:  Concentration: Fair and Attention Span: Fair  Recall:  [employer] of Knowledge:Fair  Language: Fair  Akathisia:  No  Handed:    AIMS (if indicated):  not done  Assets:  Desire for Improvement Financial Resources/Insurance  ADL's:  Intact  Cognition: WNL  Sleep:  Fair   Screenings: GAD-7    Flowsheet Row Office Visit from 11/13/2022 in [facility] at [facility] Office Visit from 03/22/2015 in [facility] at [facility]  Total GAD-7 Score 0 0      PHQ2-9    Flowsheet Row Office Visit from 11/13/2022 in [facility] at [facility] Office Visit from 06/13/2022 in [facility] Care at [facility] Office Visit from 02/13/2022 in [facility] at [facility] Office Visit from 11/07/2021 in [facility] at [facility] Video Visit from 08/04/2021 in [facility] at [facility]  PHQ-2 Total Score 0 0 0 0 0  PHQ-9 Total Score 0 0 0 0 0      Flowsheet Row Admission (Discharged) from 12/21/2022 in [place] [facility] Pre-Admission Testing 60 from 12/11/2022 in [place]   HOSPITAL-PRE-SURGICAL TESTING Video Visit from 08/29/2021 in [facility] at [facility]  C-SSRS RISK CATEGORY No Risk No Risk No Risk       Assessment and Plan:  As follows Prior documentation reviewed    GAD: job stress and recent surgery stressful, increased buspar to tid has helped, will continue and compliance with appointments discussed She does not want to add more meds or SSRI R/o PTSD; work on distraction and therapy  Fibromyalgia. Manageable, still gets fatigued, continue savella  Also on provigil   Depression: gets subdued at times, but feels may be due to winter and fibromyalgia Discussed to start back gym that has helped before, continue savella   Fu 7m.    Thresa Ross, MD 1/27/20252:42 PM

## 2023-05-22 ENCOUNTER — Other Ambulatory Visit (HOSPITAL_COMMUNITY): Payer: Self-pay

## 2023-05-25 ENCOUNTER — Other Ambulatory Visit: Payer: Self-pay

## 2023-05-25 ENCOUNTER — Other Ambulatory Visit (HOSPITAL_COMMUNITY): Payer: Self-pay

## 2023-05-25 MED ORDER — ACARBOSE 50 MG PO TABS
50.0000 mg | ORAL_TABLET | Freq: Three times a day (TID) | ORAL | 11 refills | Status: DC
  Filled 2023-05-25 – 2023-05-28 (×2): qty 90, 30d supply, fill #0
  Filled 2023-06-25: qty 90, 30d supply, fill #1
  Filled 2023-07-24: qty 90, 30d supply, fill #2
  Filled 2023-08-22: qty 90, 30d supply, fill #3
  Filled 2023-09-20: qty 90, 30d supply, fill #4
  Filled 2023-10-19: qty 90, 30d supply, fill #5
  Filled 2023-12-05: qty 90, 30d supply, fill #6
  Filled 2024-01-01: qty 90, 30d supply, fill #7
  Filled 2024-01-31: qty 90, 30d supply, fill #8
  Filled 2024-03-01: qty 90, 30d supply, fill #9
  Filled 2024-03-29: qty 90, 30d supply, fill #10
  Filled 2024-04-28: qty 90, 30d supply, fill #11

## 2023-05-27 NOTE — Assessment & Plan Note (Signed)
 Encourage heart healthy diet such as MIND or DASH diet, increase exercise, avoid trans fats, simple carbohydrates and processed foods, consider a krill or fish or flaxseed oil cap daily.

## 2023-05-27 NOTE — Assessment & Plan Note (Signed)
 Well controlled, no changes to meds. Encouraged heart healthy diet such as the DASH diet and exercise as tolerated.

## 2023-05-27 NOTE — Assessment & Plan Note (Signed)
 Hydrate and monitor

## 2023-05-28 ENCOUNTER — Other Ambulatory Visit: Payer: Self-pay | Admitting: Family Medicine

## 2023-05-28 ENCOUNTER — Ambulatory Visit: Payer: Commercial Managed Care - PPO | Admitting: Podiatry

## 2023-05-28 ENCOUNTER — Ambulatory Visit: Payer: Commercial Managed Care - PPO | Admitting: Family Medicine

## 2023-05-28 ENCOUNTER — Encounter: Payer: Self-pay | Admitting: Family Medicine

## 2023-05-28 ENCOUNTER — Other Ambulatory Visit (HOSPITAL_COMMUNITY): Payer: Self-pay

## 2023-05-28 VITALS — BP 126/82 | HR 86 | Temp 98.2°F | Resp 18 | Ht 64.0 in | Wt 195.2 lb

## 2023-05-28 DIAGNOSIS — E7801 Familial hypercholesterolemia: Secondary | ICD-10-CM | POA: Diagnosis not present

## 2023-05-28 DIAGNOSIS — E669 Obesity, unspecified: Secondary | ICD-10-CM

## 2023-05-28 DIAGNOSIS — R252 Cramp and spasm: Secondary | ICD-10-CM

## 2023-05-28 DIAGNOSIS — E782 Mixed hyperlipidemia: Secondary | ICD-10-CM

## 2023-05-28 DIAGNOSIS — Z6833 Body mass index (BMI) 33.0-33.9, adult: Secondary | ICD-10-CM | POA: Diagnosis not present

## 2023-05-28 DIAGNOSIS — E559 Vitamin D deficiency, unspecified: Secondary | ICD-10-CM

## 2023-05-28 DIAGNOSIS — E1169 Type 2 diabetes mellitus with other specified complication: Secondary | ICD-10-CM

## 2023-05-28 DIAGNOSIS — E039 Hypothyroidism, unspecified: Secondary | ICD-10-CM | POA: Diagnosis not present

## 2023-05-28 DIAGNOSIS — Z9884 Bariatric surgery status: Secondary | ICD-10-CM | POA: Diagnosis not present

## 2023-05-28 DIAGNOSIS — I10 Essential (primary) hypertension: Secondary | ICD-10-CM

## 2023-05-28 DIAGNOSIS — Z7985 Long-term (current) use of injectable non-insulin antidiabetic drugs: Secondary | ICD-10-CM

## 2023-05-28 MED ORDER — GABAPENTIN 100 MG PO CAPS
100.0000 mg | ORAL_CAPSULE | Freq: Every day | ORAL | 3 refills | Status: AC
Start: 2023-05-28 — End: ?
  Filled 2023-05-28: qty 90, 30d supply, fill #0
  Filled 2023-06-25: qty 90, 30d supply, fill #1
  Filled 2023-07-23: qty 90, 30d supply, fill #2
  Filled 2023-08-29: qty 90, 30d supply, fill #3

## 2023-05-28 NOTE — Patient Instructions (Signed)
[place] lighting online  [employer]   Seasonal Affective Disorder Seasonal affective disorder (SAD) is a type of depression. It is when you feel depressed at specific times of the year. SAD is most common during late fall and winter when the days are shorter and most people spend less time outdoors. This is why SAD is also known as the "winter blues." SAD occurs less commonly in the spring or summer, but if it does, it may be associated with overexposure to light or high pollen counts. SAD can be mild to severe, and it can interfere with work, school, relationships, and normal daily activities. What are the causes? The cause of this condition is not known. It may be related to changes in brain chemistry that are caused by having more or less exposure to daylight. What increases the risk? You are more likely to develop this condition if: You are female. You live far [place] or far [place] of the equator. These areas get less sunlight and have longer winter seasons. You have a personal history of depression or bipolar disorder. You have a family history of mental health conditions. What are the signs or symptoms? Symptoms of this condition include: Mood changes, such as: Feeling sad or teary. Having crying spells. Irritability. Feelings of guilt or worthlessness. Thinking about self-harm or attempting suicide. Physical changes, such as: Restlessness or loss of energy. Difficulty concentrating, remembering, or making decisions. A significant change in appetite or weight. Behavioral changes, such as: Trouble sleeping, or sleeping more than usual. Loss of interest in activities that you usually enjoy. Overeating or craving sweet foods. Avoiding social situations (social withdrawal), or feeling like "hibernating." Symptoms associated with the less common summer pattern of SAD include: Loss of appetite. Weight loss. Trouble sleeping. Episodes of violent behavior (in severe cases). How  is this diagnosed? This condition is usually diagnosed through an assessment with your health care provider. You will be asked about your moods, thoughts, and behaviors, and if you have noticed a seasonal pattern. You will also be asked about your medical history, any major life changes, and any medicines and substances that you use. You may have a physical exam and blood tests to rule out other possible causes of your symptoms. You may be referred to a mental health specialist for more evaluation. How is this treated? Treatment for this condition may include: Light therapy. This therapy involves sitting in front of a light source for 15-30 minutes every day. The light source may be: A light box designed specifically to treat SAD. A dawn simulator or sunrise clock. This is a timer-activated light source that copies the sunrise by slowly becoming brighter. This can help to activate your body's internal clock. Antidepressant medicine. Cognitive behavioral therapy (CBT). CBT is a form of talk therapy that helps to identify and change negative thoughts that are associated with SAD. Changes to your dietary, exercise, or sleeping habits. A healthy lifestyle may help to prevent or relieve symptoms. Follow these instructions at home: Medicines Take over-the-counter and prescription medicines only as told by your health care provider. If you are taking antidepressant medicines, ask your health care provider what side effects you should be aware of. Talk with your health care provider before you start taking any new prescription or over-the-counter medicines, herbs, or supplements. Lifestyle Eat a healthy diet that includes fruits and vegetables, whole grains, and lean proteins. Get plenty of sleep. To improve your sleep, make sure you: Keep your bedroom dark and cool. Go to  sleep and wake up at about the same time every day. Do not keep screens, such as a TV or smartphone, in your bedroom. Limit your  screen time starting a few hours before bedtime. Exercise regularly. Limit alcohol and caffeine as told by your health care provider. General instructions Attend CBT therapy sessions as directed. For fall and winter SAD only: Make your home and work environment as sunny or bright as possible. Open window blinds and move furniture closer to windows. Spend as much time outside as possible. Use light therapy for 15-30 minutes every day, or as often as directed. This should start in the early fall and continue until spring. If SAD happens during the summer, light treatment would not be recommended. Keep all follow-up visits. This is important. Where to find more information [employer] of Mental Health: http://www.maynard.net/ National Alliance on Mental Illness: www.nami.org Contact a health care provider if: Your symptoms do not get better or they get worse. You have trouble taking care of yourself. You are using drugs or alcohol to cope with your symptoms. You have side effects from medicines. Get help right away if: You have thoughts about hurting yourself or others. If you ever feel like you may hurt yourself or others, or have thoughts about taking your own life, get help right away. Go to your nearest emergency department or: Call your local emergency services (911 in the [place] Call a suicide crisis helpline, such as the National Suicide Prevention Lifeline at 505-570-3187 or 988 in the [place] This is open 24 hours a day in the [place] If you're a Veteran: Call 988 and press 1. This is open 24 hours a day. Text the [employer] at 414-094-3273. Summary Seasonal affective disorder (SAD) is a type of depression that is associated with specific times of the year (usually fall and winter). This condition may be treated with light therapy, talk therapy, and antidepressant medicines. To help treat your condition, take good care of yourself and keep all follow-up visits with your health care  provider. Get help right away if you have thoughts about hurting yourself or others. This information is not intended to replace advice given to you by your health care provider. Make sure you discuss any questions you have with your health care provider. Document Revised: 11/23/2022 Document Reviewed: 07/29/2020 Elsevier Patient Education  2024 [employer].

## 2023-05-28 NOTE — Progress Notes (Signed)
Subjective:    Patient ID: [name], female    DOB: [birth year], 63 y.o.   MRN: [id number]  Chief Complaint  Patient presents with  . Follow-up    3 month     HPI Discussed the use of AI scribe software for clinical note transcription with the patient, who gave verbal consent to proceed.  History of Present Illness   The patient, with a history of arthritis and recent rotator cuff surgery, reports ongoing recovery with residual weakness. They express concerns about not feeling right over the past couple of months and have had their medication adjusted accordingly. The patient also mentions experiencing symptoms suggestive of seasonal affective disorder, particularly during this time of the year.  In addition to these issues, the patient is struggling with sleep disturbances. They report waking up at night, experiencing night sweats, and feeling tired during the day. The patient also mentions a history of bariatric surgery and is currently on medication for pain management. They express dissatisfaction with the current management of their sleep issues and are seeking alternative solutions.  The patient also reports a history of bariatric surgery and is currently on medication for pain management. The patient also mentions a history of nervousness and sweating, which they believe may be related to their thyroid levels. They report having seen a dermatologist for this issue, who suggested it might be secondary hyperhidrosis.        Past Medical History:  Diagnosis Date  . Acute bronchitis 05/25/2016  . Anxiety   . Arthritis   . Chronic pain syndrome 01/06/2013  . Chronic rhinosinusitis   . Colon polyps   . Cough 01/06/2013  . Depression   . Diabetes (HCC) 01/06/2013  . Diabetes mellitus type 2 in obese 01/06/2013   Sees Dr Nelson Chimes for eye exam Does not see podiatry, foot exam today unremarkable except for thick cracking skin on heals  pt denies   . Dyspnea    with  exertion   . Dysuria 05/25/2016  . Edema 01/06/2013  . Epistaxis 05/25/2016  . Fibroid, uterine   . Fibromyalgia   . GERD (gastroesophageal reflux disease)   . Glaucoma 03/2012  . History of kidney stones   . Hoarseness of voice 05/11/2013  . Hyperglycemia 04/13/2013  . Hyperlipemia, mixed 06/06/2007   Qualifier: Diagnosis of  By: Nena Jordan She feels Lipitor caused increased low back pain and weakness    . Hyperlipidemia   . Hypertension   . Hypokalemia 02/05/2013  . Hypothyroidism   . IBS (irritable bowel syndrome) 02/13/2011  . Low back pain 03/03/2013  . Muscle cramp 05/22/2014  . Obesity   . Obesity, unspecified 05/11/2013  . OSA (obstructive sleep apnea)    cpap   . Pain in joint, shoulder region 06/27/2015  . Pre-diabetes   . Raynaud disease 06/16/2013  . Sleep apnea   . Thyroid disease    hypothyroidism  . Vocal fold nodules     Past Surgical History:  Procedure Laterality Date  . ABDOMINAL HYSTERECTOMY    . ANAL RECTAL MANOMETRY N/A 03/22/2022   Procedure: ANO RECTAL MANOMETRY;  Surgeon: Kerin Salen, MD;  Location: [facility];  Service: Gastroenterology;  Laterality: N/A;  . BREAST EXCISIONAL BIOPSY Right    at age 60  . BREAST SURGERY  lump remove breast   age 55 yrs  . CHOLECYSTECTOMY    . colonoscopoy     . CYSTECTOMY    . DILATION AND CURETTAGE OF  UTERUS     x2  . 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   Procedure: LAPAROSCOPIC ROUX-EN-Y GASTRIC BYPASS WITH UPPER ENDOSCOPY;  Surgeon: Gaynelle Adu, MD;  Location: [facility];  Service: General;  Laterality: N/A;  . HERNIA REPAIR  5/22  . I & D EXTREMITY Left 04/11/2018   Procedure: DEBIDMENT DISTAL INTERPHALANGEAL LEFT MIDDLE;  Surgeon: Cindee Salt, MD;  Location: [facility];  Service: Orthopedics;  Laterality: Left;  . INCISIONAL HERNIA REPAIR  08/10/2020   Procedure: LAPAROSCOPIC PRIMARY REPAIR OF INCISIONAL HERNIA;  Surgeon: Gaynelle Adu, MD;  Location: [facility];  Service: General;;  .  MASS EXCISION Left 04/11/2018   Procedure: EXCISION MASS;  Surgeon: Cindee Salt, MD;  Location:  SURGERY CENTER;  Service: Orthopedics;  Laterality: Left;  . NEPHROLITHOTOMY  10/14/2021  . OOPHORECTOMY    . POLYPECTOMY    . ROTATOR CUFF REPAIR    . SHOULDER ARTHROSCOPY WITH ROTATOR CUFF REPAIR Right 12/21/2022   Procedure: Right shoulder arthroscopy, debridement, subacromial decompression, distal clavicle resection, rotator cuff repair;  Surgeon: Francena Hanly, MD;  Location: [facility];  Service: Orthopedics;  Laterality: Right;  . UPPER GI ENDOSCOPY N/A 08/10/2020   Procedure: UPPER GI ENDOSCOPY;  Surgeon: Gaynelle Adu, MD;  Location: [facility];  Service: General;  Laterality: N/A;    Family History  Problem Relation Age of Onset  . Alcohol abuse Father   . Cancer Father        renal and colon  . Hyperlipidemia Father   . Hypertension Father   . Stroke Father   . Heart disease Father   . Cancer Paternal Grandfather        colon  . Diabetes Other   . High blood pressure Mother   . High Cholesterol Mother   . Kidney disease Mother   . Depression Mother   . Anxiety disorder Mother   . Obesity Mother   . Hypertension Mother   . Breast cancer Other 45  . Diabetes Sister   . Diabetes Brother   . Arthritis Maternal Grandmother   . Heart disease Paternal Grandmother     Social History   Socioeconomic History  . Marital status: Significant Other    Spouse name: Not on file  . Number of children: 0  . Years of education: Not on file  . Highest education level: 12th grade  Occupational History  . 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 Smoking status: Former    Current packs/day: 0.00    Average packs/day: 0.5 packs/day for 30.0 years (15.0 ttl pk-yrs)    Types: Cigarettes    Start date: 04/24/1973    Quit date: 04/25/2003    Years since quitting: 20.1  . Smokeless tobacco: Never  . Tobacco comments:    smoked since age 13  Vaping Use  . Vaping status:  Never Used  Substance and Sexual Activity  . Alcohol use: No  . Drug use: Never  . Sexual activity: Not Currently    Partners: Male    Birth control/protection: None  Other Topics Concern  . Not on file  Social History Narrative   Endo-- Dr Lucianne Muss   ENT--Dr shoemaker   GI--Dr Buccini   Pulm--Dr Clance   Rheum--Dr Kellie Simmering         Social Drivers of Health   Financial Resource Strain: Low Risk  (05/21/2023)   Overall Financial Resource Strain (CARDIA)   . Difficulty of Paying Living Expenses: Not hard at all  Food  Insecurity: No Food Insecurity (05/21/2023)   Hunger Vital Sign   . Worried About Programme researcher, broadcasting/film/video in the Last Year: Never true   . Ran Out of Food in the Last Year: Never true  Transportation Needs: No Transportation Needs (05/21/2023)   PRAPARE - Transportation   . Lack of Transportation (Medical): No   . Lack of Transportation (Non-Medical): No  Physical Activity: Insufficiently Active (05/21/2023)   Exercise Vital Sign   . Days of Exercise per Week: 3 days   . Minutes of Exercise per Session: 30 min  Stress: No Stress Concern Present (05/21/2023)   [employer] of Occupational Health - Occupational Stress Questionnaire   . Feeling of Stress : Only a little  Social Connections: Moderately Isolated (05/21/2023)   Social Connection and Isolation Panel [NHANES]   . Frequency of Communication with Friends and Family: Once a week   . Frequency of Social Gatherings with Friends and Family: More than three times a week   . Attends Religious Services: Never   . Active Member of Clubs or Organizations: No   . Attends Banker Meetings: Never   . Marital Status: Living with partner  Intimate Partner Violence: Not on file    Outpatient Medications Prior to Visit  Medication Sig Dispense Refill  . acarbose (PRECOSE) 50 MG tablet Take 1 tablet (50 mg total) by mouth 3 (three) times daily with meals. 90 tablet 11  . acetaminophen (TYLENOL) 325 MG tablet  Take 650 mg by mouth every 6 (six) hours as needed for moderate pain.    Marland Kitchen albuterol (VENTOLIN HFA) 108 (90 Base) MCG/ACT inhaler Inhale 2 puffs into the lungs every 4 (four) hours as needed for wheezing or shortness of breath. 18 g 0  . bimatoprost (LUMIGAN) 0.01 % SOLN Place 1 drop into both eyes daily. 2.5 mL 12  . bisacodyl (DULCOLAX) 5 MG EC tablet Take 20 mg by mouth daily as needed for moderate constipation.    . busPIRone (BUSPAR) 7.5 MG tablet Take 1 tablet (7.5 mg total) by mouth 3 (three) times daily. 90 tablet 0  . Evolocumab (REPATHA SURECLICK) 140 MG/ML SOAJ Inject 140 mg into the skin every 14 (fourteen) days. 6 mL 3  . furosemide (LASIX) 20 MG tablet Take 1 tablet (20 mg total) by mouth daily as needed. 90 tablet 0  . ipratropium (ATROVENT) 0.06 % nasal spray Place 2 sprays into both nostrils 3 (three) times daily as needed for rhinitis 15 mL 12  . loratadine (CLARITIN) 10 MG tablet Take 0.5 tablets (5 mg total) by mouth daily as needed for allergies. Thurs, Sat, and Sun 60 tablet 3  . Menthol, Topical Analgesic, (BIOFREEZE EX) Apply 1 Application topically 3 (three) times a week. Thurs, Sat, and Sun. Rub on shoulders    . Menthol, Topical Analgesic, (ICY HOT EX) Apply 1 Application topically 3 (three) times a week. Thurs, Sat, Sun. Rub on lower back    . metoprolol tartrate (LOPRESSOR) 25 MG tablet Take 1 tablet (25 mg total) by mouth 2 (two) times daily. 180 tablet 1  . Milnacipran (SAVELLA) 50 MG TABS tablet Take 1 tablet (50 mg total) by mouth 2 (two) times daily. 60 tablet 5  . modafinil (PROVIGIL) 200 MG tablet Take 2 tablets (400 mg total) by mouth daily. 60 tablet 5  . Pediatric Multivit-Minerals (FLINTSTONES COMPLETE) CHEW Chew 2 tablets by mouth daily.    Marland Kitchen spironolactone (ALDACTONE) 50 MG tablet Take 1 tablet (50 mg total)  by mouth 3 (three) times daily. 270 tablet 3  . thyroid (NP THYROID) 90 MG tablet Take 1 tablet (90 mg total) by mouth daily. 90 tablet 1  . Vitamin D,  Ergocalciferol, (DRISDOL) 1.25 MG (50000 UNIT) CAPS capsule Take 1 capsule (50,000 Units total) by mouth every 7 (seven) days. 12 capsule 1   No facility-administered medications prior to visit.    Allergies  Allergen Reactions  . Bupropion Other (See Comments)  . Crestor [Rosuvastatin] Other (See Comments)    Myalgias and rising CPK  . Losartan Cough  . Atorvastatin     myalgias  . Simvastatin     NDC Code:16714068101 NDC Code:16714068101 NDC WJXB:[id number] Myalgias (Muscle Pain)  . Amoxicillin Rash  . Aspirin Nausea And Vomiting and Other (See Comments)    GI upset   . Codeine Rash  . Cyclobenzaprine     Patient does not tolerate  . Erythromycin Rash  . Penicillins Rash    Reaction: 15 years    Review of Systems  Constitutional: Negative.  Negative for fever and malaise/fatigue.  HENT:  Negative for congestion.   Eyes:  Negative for blurred vision.  Respiratory:  Negative for shortness of breath.   Cardiovascular:  Negative for chest pain, palpitations and leg swelling.  Gastrointestinal:  Negative for abdominal pain, blood in stool and nausea.  Genitourinary:  Negative for dysuria and frequency.  Musculoskeletal:  Negative for falls.  Skin:  Negative for rash.  Neurological:  Negative for dizziness, loss of consciousness and headaches.  Endo/Heme/Allergies:  Negative for environmental allergies.  Psychiatric/Behavioral:  Negative for depression. The patient is not nervous/anxious.       Objective:    Physical Exam Constitutional:      General: She is not in acute distress.    Appearance: Normal appearance. She is well-developed. She is not toxic-appearing.  HENT:     Head: Normocephalic and atraumatic.     Right Ear: External ear normal.     Left Ear: External ear normal.     Nose: Nose normal.  Eyes:     General:        Right eye: No discharge.        Left eye: No discharge.     Conjunctiva/sclera: Conjunctivae normal.  Neck:     Thyroid: No  thyromegaly.  Cardiovascular:     Rate and Rhythm: Normal rate and regular rhythm.     Heart sounds: Normal heart sounds. No murmur heard. Pulmonary:     Effort: Pulmonary effort is normal. No respiratory distress.     Breath sounds: Normal breath sounds.  Abdominal:     General: Bowel sounds are normal.     Palpations: Abdomen is soft.     Tenderness: There is no abdominal tenderness. There is no guarding.  Musculoskeletal:        General: Normal range of motion.     Cervical back: Neck supple.  Lymphadenopathy:     Cervical: No cervical adenopathy.  Skin:    General: Skin is warm and dry.  Neurological:     Mental Status: She is alert and oriented to person, place, and time.  Psychiatric:        Mood and Affect: Mood normal.        Behavior: Behavior normal.        Thought Content: Thought content normal.        Judgment: Judgment normal.   BP 126/82 (BP Location: Left Arm, Patient Position: Sitting, Cuff Size: Large)  Pulse 86   Temp 98.2 F (36.8 C) (Oral)   Resp 18   Ht 5\' 4"  (1.626 m)   Wt 195 lb 3.2 oz (88.5 kg)   SpO2 98%   BMI 33.51 kg/m  Wt Readings from Last 3 Encounters:  05/28/23 195 lb 3.2 oz (88.5 kg)  02/13/23 191 lb 9.6 oz (86.9 kg)  02/12/23 193 lb (87.5 kg)    Diabetic Foot Exam - Simple   No data filed    Lab Results  Component Value Date   WBC 4.4 02/13/2023   HGB 14.0 02/13/2023   HCT 43.3 02/13/2023   PLT 343.0 02/13/2023   GLUCOSE 62 (L) 02/13/2023   CHOL 112 02/13/2023   TRIG 131.0 02/13/2023   HDL 68.20 02/13/2023   LDLDIRECT 159.0 11/11/2019   LDLCALC 17 02/13/2023   ALT 19 02/13/2023   AST 22 02/13/2023   NA 141 02/13/2023   K 4.1 02/13/2023   CL 102 02/13/2023   CREATININE 0.73 02/13/2023   BUN 9 02/13/2023   CO2 28 02/13/2023   TSH 0.34 (L) 02/13/2023   HGBA1C 5.8 02/13/2023   MICROALBUR <0.7 11/13/2022    Lab Results  Component Value Date   TSH 0.34 (L) 02/13/2023   Lab Results  Component Value Date   WBC 4.4  02/13/2023   HGB 14.0 02/13/2023   HCT 43.3 02/13/2023   MCV 91.5 02/13/2023   PLT 343.0 02/13/2023   Lab Results  Component Value Date   NA 141 02/13/2023   K 4.1 02/13/2023   CO2 28 02/13/2023   GLUCOSE 62 (L) 02/13/2023   BUN 9 02/13/2023   CREATININE 0.73 02/13/2023   BILITOT 0.2 02/13/2023   ALKPHOS 84 02/13/2023   AST 22 02/13/2023   ALT 19 02/13/2023   PROT 6.8 02/13/2023   ALBUMIN 4.4 02/13/2023   CALCIUM 9.9 02/13/2023   ANIONGAP 11 12/11/2022   EGFR 58 (L) 06/22/2020   GFR 88.27 02/13/2023   Lab Results  Component Value Date   CHOL 112 02/13/2023   Lab Results  Component Value Date   HDL 68.20 02/13/2023   Lab Results  Component Value Date   LDLCALC 17 02/13/2023   Lab Results  Component Value Date   TRIG 131.0 02/13/2023   Lab Results  Component Value Date   CHOLHDL 2 02/13/2023   Lab Results  Component Value Date   HGBA1C 5.8 02/13/2023       Assessment & Plan:  Familial hypercholesterolemia Assessment & Plan: Encourage heart healthy diet such as MIND or DASH diet, increase exercise, avoid trans fats, simple carbohydrates and processed foods, consider a krill or fish or flaxseed oil cap daily.     Hyperlipemia, mixed Assessment & Plan: Encourage heart healthy diet such as MIND or DASH diet, increase exercise, avoid trans fats, simple carbohydrates and processed foods, consider a krill or fish or flaxseed oil cap daily.   Orders: -     Lipid panel  Hypertension, unspecified type Assessment & Plan: Well controlled, no changes to meds. Encouraged heart healthy diet such as the DASH diet and exercise as tolerated.    Orders: -     Comprehensive metabolic panel -     CBC with Differential/Platelet -     TSH  Muscle cramp Assessment & Plan: Hydrate and monitor   Orders: -     Magnesium  Type 2 diabetes mellitus with obesity (HCC) -     Hemoglobin A1c -     Microalbumin /  creatinine urine ratio  Vitamin D  deficiency  Hypothyroidism, unspecified type  Gastric bypass status for obesity -     Zinc -     Vitamin B12  Other orders -     Gabapentin; Take 1-3 capsules (100-300 mg total) by mouth at bedtime.  Dispense: 90 capsule; Refill: 3    Assessment and Plan    Rotator Cuff Surgery Recovery Reports weakness in the arm, but no pain or numbness. Limited range of motion. -Continue to limit weight lifting to 8-10 pounds.  Seasonal Affective Disorder Reports feeling "blue" during the winter months. -Consider light therapy.  Arthritis Reports ongoing pain. -Continue current medication regimen.  Gastrointestinal Discomfort Reports nausea and excessive gas, particularly at night. Previously noted improvement with Gabapentin. -Resume Gabapentin 100mg  at bedtime, up to 300mg  as needed.  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 Dermatology suggests it may be secondary. -Consider referral to internal endocrinologist. -Recheck thyroid levels.  Insomnia Reports difficulty sleeping, not improved with various supplements. -Trial of Amitriptyline 10mg  at bedtime, up to 30mg  as needed. -Consider cognitive behavioral therapy for insomnia (CBTi).  Weight Management Reports recent weight gain and fluctuation. Currently on Ozempic 0.25. -Increase Ozempic to 0.5.  General Health Maintenance -Continue with scheduled Pap smear in March. -Consider tetanus vaccine later in the year. -Follow-up in 4 months in person.         Danise Edge, MD

## 2023-05-29 ENCOUNTER — Other Ambulatory Visit: Payer: Self-pay

## 2023-05-29 ENCOUNTER — Ambulatory Visit: Payer: Commercial Managed Care - PPO | Admitting: Family Medicine

## 2023-05-29 ENCOUNTER — Encounter: Payer: Self-pay | Admitting: Family Medicine

## 2023-05-29 ENCOUNTER — Other Ambulatory Visit (HOSPITAL_COMMUNITY): Payer: Self-pay

## 2023-05-29 LAB — CBC WITH DIFFERENTIAL/PLATELET
Basophils Absolute: 0 10*3/uL (ref 0.0–0.1)
Basophils Relative: 0.5 % (ref 0.0–3.0)
Eosinophils Absolute: 0.1 10*3/uL (ref 0.0–0.7)
Eosinophils Relative: 2.8 % (ref 0.0–5.0)
HCT: 42.5 % (ref 36.0–46.0)
Hemoglobin: 14.3 g/dL (ref 12.0–15.0)
Lymphocytes Relative: 33.2 % (ref 12.0–46.0)
Lymphs Abs: 1.8 10*3/uL (ref 0.7–4.0)
MCHC: 33.6 g/dL (ref 30.0–36.0)
MCV: 91.2 fL (ref 78.0–100.0)
Monocytes Absolute: 0.4 10*3/uL (ref 0.1–1.0)
Monocytes Relative: 7.4 % (ref 3.0–12.0)
Neutro Abs: 3 10*3/uL (ref 1.4–7.7)
Neutrophils Relative %: 56.1 % (ref 43.0–77.0)
Platelets: 354 10*3/uL (ref 150.0–400.0)
RBC: 4.66 Mil/uL (ref 3.87–5.11)
RDW: 14.1 % (ref 11.5–15.5)
WBC: 5.3 10*3/uL (ref 4.0–10.5)

## 2023-05-29 LAB — COMPREHENSIVE METABOLIC PANEL
ALT: 23 U/L (ref 0–35)
AST: 25 U/L (ref 0–37)
Albumin: 4.2 g/dL (ref 3.5–5.2)
Alkaline Phosphatase: 107 U/L (ref 39–117)
BUN: 12 mg/dL (ref 6–23)
CO2: 27 meq/L (ref 19–32)
Calcium: 9.2 mg/dL (ref 8.4–10.5)
Chloride: 103 meq/L (ref 96–112)
Creatinine, Ser: 0.82 mg/dL (ref 0.40–1.20)
GFR: 76.62 mL/min (ref >60.00)
Glucose, Bld: 85 mg/dL (ref 70–99)
Potassium: 4.5 meq/L (ref 3.5–5.1)
Sodium: 140 meq/L (ref 135–145)
Total Bilirubin: 0.2 mg/dL (ref 0.2–1.2)
Total Protein: 6.5 g/dL (ref 6.0–8.3)

## 2023-05-29 LAB — LIPID PANEL
Cholesterol: 116 mg/dL (ref 0–200)
HDL: 60.5 mg/dL (ref >39.00)
LDL Cholesterol: 33 mg/dL (ref 0–99)
NonHDL: 55.41
Total CHOL/HDL Ratio: 2
Triglycerides: 114 mg/dL (ref 0.0–149.0)
VLDL: 22.8 mg/dL (ref 0.0–40.0)

## 2023-05-29 LAB — MICROALBUMIN / CREATININE URINE RATIO
Creatinine,U: 97.5 mg/dL
Microalb Creat Ratio: 0.7 mg/g (ref 0.0–30.0)
Microalb, Ur: <0.7 mg/dL (ref 0.0–1.9)

## 2023-05-29 LAB — VITAMIN B12: Vitamin B-12: 364 pg/mL (ref 211–911)

## 2023-05-29 LAB — HEMOGLOBIN A1C: Hgb A1c MFr Bld: 6 % (ref 4.6–6.5)

## 2023-05-29 LAB — MAGNESIUM: Magnesium: 2 mg/dL (ref 1.5–2.5)

## 2023-05-29 LAB — TSH: TSH: 0.46 u[IU]/mL (ref 0.35–5.50)

## 2023-05-30 ENCOUNTER — Other Ambulatory Visit: Payer: Self-pay | Admitting: Emergency Medicine

## 2023-05-30 ENCOUNTER — Encounter (HOSPITAL_COMMUNITY): Payer: Self-pay

## 2023-05-30 ENCOUNTER — Other Ambulatory Visit (HOSPITAL_COMMUNITY): Payer: Self-pay

## 2023-05-30 DIAGNOSIS — R350 Frequency of micturition: Secondary | ICD-10-CM

## 2023-05-30 NOTE — Telephone Encounter (Signed)
 Called patient and scheduled lab appointment. She is going to contact her pharmacy and see if Ozempic is covered by her insurance

## 2023-06-01 LAB — ZINC: Zinc: 80 ug/dL (ref 60–130)

## 2023-06-04 ENCOUNTER — Other Ambulatory Visit: Payer: Self-pay

## 2023-06-04 ENCOUNTER — Encounter: Payer: Self-pay | Admitting: Family Medicine

## 2023-06-04 ENCOUNTER — Other Ambulatory Visit (HOSPITAL_COMMUNITY): Payer: Self-pay

## 2023-06-04 ENCOUNTER — Other Ambulatory Visit: Payer: Self-pay | Admitting: Family Medicine

## 2023-06-04 ENCOUNTER — Ambulatory Visit: Payer: Commercial Managed Care - PPO | Admitting: Podiatry

## 2023-06-04 ENCOUNTER — Other Ambulatory Visit (INDEPENDENT_AMBULATORY_CARE_PROVIDER_SITE_OTHER): Payer: Commercial Managed Care - PPO

## 2023-06-04 DIAGNOSIS — R350 Frequency of micturition: Secondary | ICD-10-CM | POA: Diagnosis not present

## 2023-06-04 DIAGNOSIS — E785 Hyperlipidemia, unspecified: Secondary | ICD-10-CM

## 2023-06-04 LAB — URINALYSIS, ROUTINE W REFLEX MICROSCOPIC
Bilirubin Urine: NEGATIVE
Hgb urine dipstick: NEGATIVE
Ketones, ur: NEGATIVE
Leukocytes,Ua: NEGATIVE
Nitrite: NEGATIVE
RBC / HPF: NONE SEEN (ref 0–?)
Specific Gravity, Urine: 1.01 (ref 1.000–1.030)
Total Protein, Urine: NEGATIVE
Urine Glucose: NEGATIVE
Urobilinogen, UA: 0.2 (ref 0.0–1.0)
pH: 7 (ref 5.0–8.0)

## 2023-06-04 MED ORDER — VITAMIN D (ERGOCALCIFEROL) 1.25 MG (50000 UNIT) PO CAPS
50000.0000 [IU] | ORAL_CAPSULE | ORAL | 1 refills | Status: DC
  Filled 2023-06-04: qty 12, 84d supply, fill #0
  Filled 2023-06-05 – 2023-08-20 (×2): qty 12, 84d supply, fill #1

## 2023-06-04 NOTE — Addendum Note (Signed)
 Addended by: Marylou Sobers D on: 06/04/2023 08:56 AM   Modules accepted: Orders

## 2023-06-05 ENCOUNTER — Other Ambulatory Visit (HOSPITAL_COMMUNITY): Payer: Self-pay

## 2023-06-05 ENCOUNTER — Other Ambulatory Visit: Payer: Self-pay | Admitting: Family Medicine

## 2023-06-05 ENCOUNTER — Other Ambulatory Visit: Payer: Self-pay

## 2023-06-06 ENCOUNTER — Other Ambulatory Visit (HOSPITAL_COMMUNITY): Payer: Self-pay

## 2023-06-06 ENCOUNTER — Other Ambulatory Visit: Payer: Self-pay | Admitting: Family Medicine

## 2023-06-06 LAB — URINE CULTURE
MICRO NUMBER:: 16063127
SPECIMEN QUALITY:: ADEQUATE

## 2023-06-06 MED ORDER — CEFDINIR 300 MG PO CAPS
300.0000 mg | ORAL_CAPSULE | Freq: Two times a day (BID) | ORAL | 0 refills | Status: DC
  Filled 2023-06-06: qty 10, 5d supply, fill #0

## 2023-06-06 MED ORDER — THYROID 90 MG PO TABS
90.0000 mg | ORAL_TABLET | ORAL | 4 refills | Status: DC
  Filled 2023-06-06: qty 90, 90d supply, fill #0
  Filled 2023-06-06: qty 60, 90d supply, fill #0

## 2023-06-07 ENCOUNTER — Other Ambulatory Visit (HOSPITAL_COMMUNITY): Payer: Self-pay

## 2023-06-07 ENCOUNTER — Other Ambulatory Visit: Payer: Self-pay

## 2023-06-08 ENCOUNTER — Encounter: Payer: Self-pay | Admitting: Family Medicine

## 2023-06-18 ENCOUNTER — Ambulatory Visit: Payer: Commercial Managed Care - PPO | Admitting: Family Medicine

## 2023-06-25 ENCOUNTER — Other Ambulatory Visit (HOSPITAL_COMMUNITY): Payer: Self-pay

## 2023-06-25 ENCOUNTER — Other Ambulatory Visit: Payer: Self-pay

## 2023-06-25 ENCOUNTER — Other Ambulatory Visit (HOSPITAL_COMMUNITY): Payer: Self-pay | Admitting: Psychiatry

## 2023-06-25 ENCOUNTER — Other Ambulatory Visit: Payer: Self-pay | Admitting: Family Medicine

## 2023-06-25 ENCOUNTER — Other Ambulatory Visit: Payer: Self-pay | Admitting: *Deleted

## 2023-06-25 DIAGNOSIS — E785 Hyperlipidemia, unspecified: Secondary | ICD-10-CM

## 2023-06-25 DIAGNOSIS — H40013 Open angle with borderline findings, low risk, bilateral: Secondary | ICD-10-CM | POA: Diagnosis not present

## 2023-06-25 DIAGNOSIS — Z6833 Body mass index (BMI) 33.0-33.9, adult: Secondary | ICD-10-CM | POA: Diagnosis not present

## 2023-06-25 DIAGNOSIS — Z01419 Encounter for gynecological examination (general) (routine) without abnormal findings: Secondary | ICD-10-CM | POA: Diagnosis not present

## 2023-06-25 MED ORDER — THYROID 90 MG PO TABS
90.0000 mg | ORAL_TABLET | Freq: Every day | ORAL | 1 refills | Status: DC
  Filled 2023-06-25 – 2023-08-22 (×4): qty 90, 90d supply, fill #0

## 2023-06-25 MED ORDER — BUSPIRONE HCL 7.5 MG PO TABS
7.5000 mg | ORAL_TABLET | Freq: Three times a day (TID) | ORAL | 0 refills | Status: DC
  Filled 2023-06-25: qty 90, 30d supply, fill #0

## 2023-06-26 ENCOUNTER — Other Ambulatory Visit: Payer: Self-pay

## 2023-06-26 ENCOUNTER — Other Ambulatory Visit (HOSPITAL_BASED_OUTPATIENT_CLINIC_OR_DEPARTMENT_OTHER): Payer: Self-pay

## 2023-06-27 ENCOUNTER — Other Ambulatory Visit: Payer: Self-pay

## 2023-06-27 ENCOUNTER — Other Ambulatory Visit (HOSPITAL_COMMUNITY): Payer: Self-pay

## 2023-07-04 ENCOUNTER — Ambulatory Visit: Payer: Commercial Managed Care - PPO | Admitting: Podiatry

## 2023-07-10 ENCOUNTER — Other Ambulatory Visit: Payer: Self-pay

## 2023-07-11 ENCOUNTER — Other Ambulatory Visit: Payer: Self-pay

## 2023-07-23 ENCOUNTER — Other Ambulatory Visit: Payer: Self-pay | Admitting: Family Medicine

## 2023-07-24 ENCOUNTER — Other Ambulatory Visit (HOSPITAL_COMMUNITY): Payer: Self-pay

## 2023-07-24 ENCOUNTER — Other Ambulatory Visit: Payer: Self-pay

## 2023-07-24 DIAGNOSIS — K5900 Constipation, unspecified: Secondary | ICD-10-CM | POA: Diagnosis not present

## 2023-07-24 DIAGNOSIS — M6289 Other specified disorders of muscle: Secondary | ICD-10-CM | POA: Diagnosis not present

## 2023-07-24 DIAGNOSIS — M62838 Other muscle spasm: Secondary | ICD-10-CM | POA: Diagnosis not present

## 2023-07-24 DIAGNOSIS — M6281 Muscle weakness (generalized): Secondary | ICD-10-CM | POA: Diagnosis not present

## 2023-07-24 MED ORDER — FUROSEMIDE 20 MG PO TABS
20.0000 mg | ORAL_TABLET | Freq: Every day | ORAL | 1 refills | Status: AC | PRN
  Filled 2023-07-24: qty 90, 90d supply, fill #0
  Filled 2024-02-10: qty 90, 90d supply, fill #1

## 2023-07-25 ENCOUNTER — Other Ambulatory Visit: Payer: Self-pay

## 2023-07-30 ENCOUNTER — Other Ambulatory Visit (HOSPITAL_COMMUNITY): Payer: Self-pay

## 2023-07-30 ENCOUNTER — Other Ambulatory Visit (HOSPITAL_COMMUNITY): Payer: Self-pay | Admitting: Psychiatry

## 2023-07-30 MED ORDER — BUSPIRONE HCL 7.5 MG PO TABS
7.5000 mg | ORAL_TABLET | Freq: Three times a day (TID) | ORAL | 0 refills | Status: DC
  Filled 2023-07-30: qty 90, 30d supply, fill #0

## 2023-08-06 ENCOUNTER — Other Ambulatory Visit: Payer: Self-pay | Admitting: Family Medicine

## 2023-08-06 DIAGNOSIS — Z803 Family history of malignant neoplasm of breast: Secondary | ICD-10-CM | POA: Diagnosis not present

## 2023-08-07 ENCOUNTER — Other Ambulatory Visit (HOSPITAL_COMMUNITY): Payer: Self-pay

## 2023-08-07 MED ORDER — METOPROLOL TARTRATE 25 MG PO TABS
25.0000 mg | ORAL_TABLET | Freq: Two times a day (BID) | ORAL | 1 refills | Status: DC
  Filled 2023-08-07: qty 180, 90d supply, fill #0
  Filled 2023-11-06: qty 180, 90d supply, fill #1

## 2023-08-20 ENCOUNTER — Other Ambulatory Visit: Payer: Self-pay

## 2023-08-20 ENCOUNTER — Other Ambulatory Visit: Payer: Self-pay | Admitting: Family Medicine

## 2023-08-20 ENCOUNTER — Encounter (HOSPITAL_COMMUNITY): Payer: Self-pay | Admitting: Psychiatry

## 2023-08-20 ENCOUNTER — Other Ambulatory Visit (HOSPITAL_COMMUNITY): Payer: Self-pay

## 2023-08-20 ENCOUNTER — Telehealth (HOSPITAL_COMMUNITY): Payer: Commercial Managed Care - PPO | Admitting: Psychiatry

## 2023-08-20 DIAGNOSIS — F411 Generalized anxiety disorder: Secondary | ICD-10-CM

## 2023-08-20 DIAGNOSIS — M797 Fibromyalgia: Secondary | ICD-10-CM | POA: Diagnosis not present

## 2023-08-20 MED ORDER — BUSPIRONE HCL 10 MG PO TABS
10.0000 mg | ORAL_TABLET | Freq: Three times a day (TID) | ORAL | 0 refills | Status: DC
Start: 2023-08-20 — End: 2023-09-25
  Filled 2023-08-20: qty 90, 30d supply, fill #0

## 2023-08-20 NOTE — Progress Notes (Signed)
 [facility] Follow up visit  Patient Identification: URWA TERRACINA MRN:  [id number] Date of Evaluation:  08/20/2023 Referral Source: Primary care Chief Complaint:  follow up anxiety Visit Diagnosis:    ICD-10-CM   1. Generalized anxiety disorder  F41.1     2. Fibromyalgia  M79.7     Virtual Visit via Video Note  I connected with Ruta S Wessman on 08/20/23 at  3:00 PM EDT by a video enabled telemedicine application and verified that I am speaking with the correct person using two identifiers.  Location: Patient: home Provider: home office   I discussed the limitations of evaluation and management by telemedicine and the availability of in person appointments. The patient expressed understanding and agreed to proceed.  History of Present Illness:       I discussed the assessment and treatment plan with the patient. The patient was provided an opportunity to ask questions and all were answered. The patient agreed with the plan and demonstrated an understanding of the instructions.   The patient was advised to call back or seek an in-person evaluation if the symptoms worsen or if the condition fails to improve as anticipated.  I provided 18 minutes of non-face-to-face time during this encounter.   History of Present Illness: Patient is a 63  years old female lives with her significant other initially referred by primary care physician for establish care for anxiety.  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  Does not have any kids  Job can be stressful but handling it Got shoulder surgery, recovering Still gets anxious and sweat at times, wants to increase buspar   Not in therapy, we do recommend to start that as well   Also on provigil , savella  for tiredness and depression Has added out door and goes to gym at times She uses a CPAP machine at night  Aggravating factors; job stress, brothers death Modifying factors; gym,  Duration since adult  life Severity gets anxious     Past Psychiatric History: anxiety  Previous Psychotropic Medications: Yes  Effexor , xanax   Past Medical History:  Past Medical History:  Diagnosis Date   Acute bronchitis 05/25/2016   Anxiety    Arthritis    Chronic pain syndrome 01/06/2013   Chronic rhinosinusitis    Colon polyps    Cough 01/06/2013   Depression    Diabetes (HCC) 01/06/2013   Diabetes mellitus type 2 in obese 01/06/2013   Sees Dr Corie Diamond for eye exam Does not see podiatry, foot exam today unremarkable except for thick cracking skin on heals  pt denies    Dyspnea    with exertion    Dysuria 05/25/2016   Edema 01/06/2013   Epistaxis 05/25/2016   Fibroid, uterine    Fibromyalgia    GERD (gastroesophageal reflux disease)    Glaucoma 03/2012   History of kidney stones    Hoarseness of voice 05/11/2013   Hyperglycemia 04/13/2013   Hyperlipemia, mixed 06/06/2007   Qualifier: Diagnosis of  By: Marthe Slain She feels Lipitor caused increased low back pain and weakness     Hyperlipidemia    Hypertension    Hypokalemia 02/05/2013   Hypothyroidism    IBS (irritable bowel syndrome) 02/13/2011   Low back pain 03/03/2013   Muscle cramp 05/22/2014   Obesity    Obesity, unspecified 05/11/2013   OSA (obstructive sleep apnea)    cpap    Pain in joint, shoulder region 06/27/2015   Pre-diabetes  Raynaud disease 06/16/2013   Sleep apnea    Thyroid  disease    hypothyroidism   Vocal fold nodules     Past Surgical History:  Procedure Laterality Date   ABDOMINAL HYSTERECTOMY     ANAL RECTAL MANOMETRY N/A 03/22/2022   Procedure: ANO RECTAL MANOMETRY;  Surgeon: Genell Ken, MD;  Location: [facility];  Service: Gastroenterology;  Laterality: N/A;   BREAST EXCISIONAL BIOPSY Right    at age 32   BREAST SURGERY  lump remove breast   age 3 yrs   CHOLECYSTECTOMY     colonoscopoy      CYSTECTOMY     DILATION AND CURETTAGE OF UTERUS     x2   GASTRIC ROUX-EN-Y N/A  08/10/2020   Procedure: LAPAROSCOPIC ROUX-EN-Y GASTRIC BYPASS WITH UPPER ENDOSCOPY;  Surgeon: Aldean Hummingbird, MD;  Location: [facility];  Service: General;  Laterality: N/A;   HERNIA REPAIR  5/22   I & D EXTREMITY Left 04/11/2018   Procedure: DEBIDMENT DISTAL INTERPHALANGEAL LEFT MIDDLE;  Surgeon: Lyanne Sample, MD;  Location: [facility];  Service: Orthopedics;  Laterality: Left;   INCISIONAL HERNIA REPAIR  08/10/2020   Procedure: LAPAROSCOPIC PRIMARY REPAIR OF INCISIONAL HERNIA;  Surgeon: Aldean Hummingbird, MD;  Location: [facility];  Service: General;;   MASS EXCISION Left 04/11/2018   Procedure: EXCISION MASS;  Surgeon: Lyanne Sample, MD;  Location: [facility];  Service: Orthopedics;  Laterality: Left;   NEPHROLITHOTOMY  10/14/2021   OOPHORECTOMY     POLYPECTOMY     ROTATOR CUFF REPAIR     SHOULDER ARTHROSCOPY WITH ROTATOR CUFF REPAIR Right 12/21/2022   Procedure: Right shoulder arthroscopy, debridement, subacromial decompression, distal clavicle resection, rotator cuff repair;  Surgeon: Ellard Gunning, MD;  Location: [facility];  Service: Orthopedics;  Laterality: Right;   UPPER GI ENDOSCOPY N/A 08/10/2020   Procedure: UPPER GI ENDOSCOPY;  Surgeon: Aldean Hummingbird, MD;  Location: [facility];  Service: General;  Laterality: N/A;    Family Psychiatric History: dad : alcohol use  Family History:  Family History  Problem Relation Age of Onset   Alcohol abuse Father    Cancer Father        renal and colon   Hyperlipidemia Father    Hypertension Father    Stroke Father    Heart disease Father    Cancer Paternal Grandfather        colon   Diabetes Other    High blood pressure Mother    High Cholesterol Mother    Kidney disease Mother    Depression Mother    Anxiety disorder Mother    Obesity Mother    Hypertension Mother    Breast cancer Other 56   Diabetes Sister    Diabetes Brother    Arthritis Maternal Grandmother    Heart disease Paternal Grandmother     Social History:    Social History   Socioeconomic History   Marital status: Significant Other    Spouse name: Not on file   Number of children: 0   Years of education: Not on file   Highest education level: 12th grade  Occupational History   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  Tobacco Use   Smoking status: Former    Current packs/day: 0.00    Average packs/day: 0.5 packs/day for 30.0 years (15.0 ttl pk-yrs)    Types: Cigarettes    Start date: 04/24/1973    Quit date: 04/25/2003    Years since quitting: 20.3   Smokeless tobacco: Never  Tobacco comments:    smoked since age 32  Vaping Use   Vaping status: Never Used  Substance and Sexual Activity   Alcohol use: No   Drug use: Never   Sexual activity: Not Currently    Partners: Male    Birth control/protection: None  Other Topics Concern   Not on file  Social History Narrative   Endo-- Dr Hubert Madden   ENT--Dr shoemaker   GI--Dr Buccini   Pulm--Dr Clance   Rheum--Dr Bernadine Briar         Social Drivers of Health   Financial Resource Strain: Low Risk  (05/21/2023)   Overall Financial Resource Strain (CARDIA)    Difficulty of Paying Living Expenses: Not hard at all  Food Insecurity: No Food Insecurity (05/21/2023)   Hunger Vital Sign    Worried About Running Out of Food in the Last Year: Never true    Ran Out of Food in the Last Year: Never true  Transportation Needs: No Transportation Needs (05/21/2023)   PRAPARE - Administrator, Civil Service (Medical): No    Lack of Transportation (Non-Medical): No  Physical Activity: Insufficiently Active (05/21/2023)   Exercise Vital Sign    Days of Exercise per Week: 3 days    Minutes of Exercise per Session: 30 min  Stress: No Stress Concern Present (05/21/2023)   [employer] of Occupational Health - Occupational Stress Questionnaire    Feeling of Stress : Only a little  Social Connections: Moderately Isolated (05/21/2023)   Social Connection and Isolation Panel [NHANES]    Frequency of  Communication with Friends and Family: Once a week    Frequency of Social Gatherings with Friends and Family: More than three times a week    Attends Religious Services: Never    Database administrator or Organizations: No    Attends Banker Meetings: Never    Marital Status: Living with partner      Allergies:   Allergies  Allergen Reactions   Bupropion  Other (See Comments)   Crestor  [Rosuvastatin ] Other (See Comments)    Myalgias and rising CPK   Losartan  Cough   Atorvastatin      myalgias   Simvastatin      NDC Code:16714068101 NDC Code:[id number] NDC WUJW:[id number] Myalgias (Muscle Pain)   Amoxicillin Rash   Aspirin  Nausea And Vomiting and Other (See Comments)    GI upset    Codeine Rash   Cyclobenzaprine      Patient does not tolerate   Erythromycin Rash   Penicillins Rash    Reaction: 15 years    Metabolic Disorder Labs: Lab Results  Component Value Date   HGBA1C 6.0 05/28/2023   MPG 122.63 08/03/2020   MPG 137 02/13/2020   No results found for: "PROLACTIN" Lab Results  Component Value Date   CHOL 116 05/28/2023   TRIG 114.0 05/28/2023   HDL 60.50 05/28/2023   CHOLHDL 2 05/28/2023   VLDL 22.8 05/28/2023   LDLCALC 33 05/28/2023   LDLCALC 17 02/13/2023   Lab Results  Component Value Date   TSH 0.46 05/28/2023    Therapeutic Level Labs: No results found for: "LITHIUM" No results found for: "CBMZ" No results found for: "VALPROATE"  Current Medications: Current Outpatient Medications  Medication Sig Dispense Refill   acarbose  (PRECOSE ) 50 MG tablet Take 1 tablet (50 mg total) by mouth 3 (three) times daily with meals. 90 tablet 11   acetaminophen  (TYLENOL ) 325 MG tablet Take 650 mg by mouth every 6 (six) hours as  needed for moderate pain.     albuterol  (VENTOLIN  HFA) 108 (90 Base) MCG/ACT inhaler Inhale 2 puffs into the lungs every 4 (four) hours as needed for wheezing or shortness of breath. 18 g 0   bimatoprost  (LUMIGAN ) 0.01 %  SOLN Place 1 drop into both eyes daily. 2.5 mL 12   bisacodyl  (DULCOLAX) 5 MG EC tablet Take 20 mg by mouth daily as needed for moderate constipation.     busPIRone  (BUSPAR ) 10 MG tablet Take 1 tablet (10 mg total) by mouth 3 (three) times daily. 90 tablet 0   cefdinir  (OMNICEF ) 300 MG capsule Take 1 capsule (300 mg total) by mouth 2 (two) times daily. 10 capsule 0   Evolocumab  (REPATHA  SURECLICK) 140 MG/ML SOAJ Inject 140 mg into the skin every 14 (fourteen) days. 6 mL 3   furosemide  (LASIX ) 20 MG tablet Take 1 tablet (20 mg total) by mouth daily as needed. 90 tablet 1   gabapentin  (NEURONTIN ) 100 MG capsule Take 1-3 capsules (100-300 mg total) by mouth at bedtime. 90 capsule 3   ipratropium (ATROVENT ) 0.06 % nasal spray Place 2 sprays into both nostrils 3 (three) times daily as needed for rhinitis 15 mL 12   loratadine  (CLARITIN ) 10 MG tablet Take 0.5 tablets (5 mg total) by mouth daily as needed for allergies. Thurs, Sat, and Sun 60 tablet 3   Menthol, Topical Analgesic, (BIOFREEZE EX) Apply 1 Application topically 3 (three) times a week. Thurs, Sat, and Sun. Rub on shoulders     Menthol, Topical Analgesic, (ICY HOT EX) Apply 1 Application topically 3 (three) times a week. Thurs, Sat, Sun. Rub on lower back     metoprolol  tartrate (LOPRESSOR ) 25 MG tablet Take 1 tablet (25 mg total) by mouth 2 (two) times daily. 180 tablet 1   Milnacipran  (SAVELLA ) 50 MG TABS tablet Take 1 tablet (50 mg total) by mouth 2 (two) times daily. 60 tablet 5   modafinil  (PROVIGIL ) 200 MG tablet Take 2 tablets (400 mg total) by mouth daily. 60 tablet 5   Pediatric Multivit-Minerals (FLINTSTONES COMPLETE) CHEW Chew 2 tablets by mouth daily.     spironolactone  (ALDACTONE ) 50 MG tablet Take 1 tablet (50 mg total) by mouth 3 (three) times daily. 270 tablet 3   thyroid  (NP THYROID ) 90 MG tablet Take 1 tablet (90 mg total) by mouth daily. 90 tablet 1   Vitamin D , Ergocalciferol , (DRISDOL ) 1.25 MG (50000 UNIT) CAPS capsule Take  1 capsule (50,000 Units total) by mouth every 7 (seven) days. 12 capsule 1   No current facility-administered medications for this visit.     Psychiatric Specialty Exam: Review of Systems  Cardiovascular:  Negative for chest pain.  Psychiatric/Behavioral:  Negative for agitation and self-injury. The patient is nervous/anxious.     There were no vitals taken for this visit.There is no height or weight on file to calculate BMI.  General Appearance: Casual  Eye Contact:  Fair  Speech:  Slow  Volume:  Decreased  Mood:  somewhat stressed  Affect:  Constricted  Thought Process:  Goal Directed  Orientation:  Full (Time, Place, and Person)  Thought Content:  Rumination  Suicidal Thoughts:  No  Homicidal Thoughts:  No  Memory:  Immediate;   Fair Recent;   Fair  Judgement:  Fair  Insight:  Fair  Psychomotor Activity:  Decreased  Concentration:  Concentration: Fair and Attention Span: Fair  Recall:  [employer] of Knowledge:Fair  Language: Fair  Akathisia:  No  Handed:  AIMS (if indicated):  not done  Assets:  Desire for Improvement Financial Resources/Insurance  ADL's:  Intact  Cognition: WNL  Sleep:  Fair   Screenings: GAD-7    Flowsheet Row Office Visit from 11/13/2022 in [facility] Office Visit from 03/22/2015 in [facility]  Total GAD-7 Score 0 0      PHQ2-9    Flowsheet Row Office Visit from 11/13/2022 in [facility] at [facility] Office Visit from 06/13/2022 in [facility] at [facility] Office Visit from 02/13/2022 in [facility] at [facility] Office Visit from 11/07/2021 in [facility] at [facility] Video Visit from 08/04/2021 in [facility] at [facility]  PHQ-2 Total Score 0 0 0 0 0  PHQ-9 Total Score 0 0 0 0 0       Flowsheet Row Video Visit from 05/21/2023 in [facility] at [facility] Admission (Discharged) from 12/21/2022 in [facility] Pre-Admission Testing 60 from 12/11/2022 in [facility]UR[facility] TESTING  C-SSRS RISK CATEGORY No Risk No Risk No Risk       Assessment and Plan:  As follows  Prior documentation reviewed    GAD: job stress and recent surgery stressful,  can get worriful and sweat when anxious, increase buspar  to 10mg  tid, continue savella  for fibro  R/o PTSD; work on distraction and therapy Should re consider therapy   Fibromyalgia. Manageable is on provigil , savella  . Also cpap machine has helped tiredness  Depression: manageable continue meds and add ME time  Plans and now goes to gym  Reviewed meds, questions addressed FU 2 - 3 m or earlier if needed   Wray Heady, MD 4/28/20253:01 PM

## 2023-08-21 ENCOUNTER — Other Ambulatory Visit (HOSPITAL_COMMUNITY): Payer: Self-pay

## 2023-08-21 ENCOUNTER — Other Ambulatory Visit: Payer: Self-pay

## 2023-08-21 MED ORDER — MODAFINIL 200 MG PO TABS
400.0000 mg | ORAL_TABLET | Freq: Every day | ORAL | 5 refills | Status: DC
  Filled 2023-08-21: qty 60, 30d supply, fill #0
  Filled 2023-09-25: qty 60, 30d supply, fill #1
  Filled 2023-11-13: qty 60, 30d supply, fill #2
  Filled 2023-12-31: qty 60, 30d supply, fill #3

## 2023-08-21 NOTE — Telephone Encounter (Signed)
 Requesting: Provigil  200 mg Contract: N/A UDS: N/A Last Visit: 05/28/2023 Next Visit: 10/29/2023 Last Refill: 02/19/2023  Please Advise

## 2023-08-22 ENCOUNTER — Other Ambulatory Visit (HOSPITAL_COMMUNITY): Payer: Self-pay

## 2023-08-23 ENCOUNTER — Other Ambulatory Visit: Payer: Self-pay

## 2023-08-23 ENCOUNTER — Other Ambulatory Visit (HOSPITAL_COMMUNITY): Payer: Self-pay

## 2023-08-29 ENCOUNTER — Other Ambulatory Visit (HOSPITAL_COMMUNITY): Payer: Self-pay

## 2023-08-30 ENCOUNTER — Ambulatory Visit: Payer: Commercial Managed Care - PPO | Admitting: Internal Medicine

## 2023-09-04 ENCOUNTER — Ambulatory Visit (HOSPITAL_BASED_OUTPATIENT_CLINIC_OR_DEPARTMENT_OTHER): Payer: Commercial Managed Care - PPO | Admitting: Internal Medicine

## 2023-09-10 DIAGNOSIS — R350 Frequency of micturition: Secondary | ICD-10-CM | POA: Diagnosis not present

## 2023-09-10 DIAGNOSIS — R150 Incomplete defecation: Secondary | ICD-10-CM | POA: Diagnosis not present

## 2023-09-10 DIAGNOSIS — K5900 Constipation, unspecified: Secondary | ICD-10-CM | POA: Diagnosis not present

## 2023-09-10 DIAGNOSIS — M6289 Other specified disorders of muscle: Secondary | ICD-10-CM | POA: Diagnosis not present

## 2023-09-10 DIAGNOSIS — M6281 Muscle weakness (generalized): Secondary | ICD-10-CM | POA: Diagnosis not present

## 2023-09-10 DIAGNOSIS — M62838 Other muscle spasm: Secondary | ICD-10-CM | POA: Diagnosis not present

## 2023-09-18 ENCOUNTER — Other Ambulatory Visit (HOSPITAL_COMMUNITY): Payer: Self-pay

## 2023-09-21 ENCOUNTER — Other Ambulatory Visit: Payer: Self-pay

## 2023-09-25 ENCOUNTER — Other Ambulatory Visit (HOSPITAL_COMMUNITY): Payer: Self-pay | Admitting: Psychiatry

## 2023-09-25 ENCOUNTER — Other Ambulatory Visit (HOSPITAL_COMMUNITY): Payer: Self-pay

## 2023-09-25 ENCOUNTER — Other Ambulatory Visit: Payer: Self-pay

## 2023-09-25 ENCOUNTER — Other Ambulatory Visit: Payer: Self-pay | Admitting: Family Medicine

## 2023-09-25 DIAGNOSIS — E119 Type 2 diabetes mellitus without complications: Secondary | ICD-10-CM | POA: Diagnosis not present

## 2023-09-25 DIAGNOSIS — M6289 Other specified disorders of muscle: Secondary | ICD-10-CM | POA: Diagnosis not present

## 2023-09-25 DIAGNOSIS — M6281 Muscle weakness (generalized): Secondary | ICD-10-CM | POA: Diagnosis not present

## 2023-09-25 DIAGNOSIS — H40013 Open angle with borderline findings, low risk, bilateral: Secondary | ICD-10-CM | POA: Diagnosis not present

## 2023-09-25 DIAGNOSIS — L910 Hypertrophic scar: Secondary | ICD-10-CM | POA: Diagnosis not present

## 2023-09-25 DIAGNOSIS — H524 Presbyopia: Secondary | ICD-10-CM | POA: Diagnosis not present

## 2023-09-25 DIAGNOSIS — K5902 Outlet dysfunction constipation: Secondary | ICD-10-CM | POA: Diagnosis not present

## 2023-09-25 DIAGNOSIS — M62838 Other muscle spasm: Secondary | ICD-10-CM | POA: Diagnosis not present

## 2023-09-25 LAB — HM DIABETES EYE EXAM

## 2023-09-25 MED ORDER — BUSPIRONE HCL 10 MG PO TABS
10.0000 mg | ORAL_TABLET | Freq: Three times a day (TID) | ORAL | 0 refills | Status: DC
  Filled 2023-09-25: qty 90, 30d supply, fill #0

## 2023-09-25 MED ORDER — LATANOPROST 0.005 % OP SOLN
1.0000 [drp] | OPHTHALMIC | 2 refills | Status: AC
  Filled 2023-09-25: qty 2.5, 42d supply, fill #0
  Filled 2023-11-03: qty 2.5, 42d supply, fill #1
  Filled 2023-12-17: qty 2.5, 42d supply, fill #2

## 2023-09-25 MED ORDER — LORATADINE 10 MG PO TABS
5.0000 mg | ORAL_TABLET | Freq: Every day | ORAL | 3 refills | Status: AC | PRN
  Filled 2023-09-25 – 2023-10-19 (×2): qty 36, 72d supply, fill #0
  Filled 2023-12-10: qty 36, 72d supply, fill #1
  Filled 2024-03-24: qty 36, 72d supply, fill #2

## 2023-09-25 MED ORDER — IPRATROPIUM BROMIDE 0.06 % NA SOLN
2.0000 | Freq: Three times a day (TID) | NASAL | 12 refills | Status: AC
  Filled 2023-09-25: qty 15, 14d supply, fill #0
  Filled 2023-10-19: qty 15, 14d supply, fill #1
  Filled 2023-11-03: qty 15, 14d supply, fill #2

## 2023-09-26 DIAGNOSIS — K5902 Outlet dysfunction constipation: Secondary | ICD-10-CM | POA: Diagnosis not present

## 2023-09-26 DIAGNOSIS — K5904 Chronic idiopathic constipation: Secondary | ICD-10-CM | POA: Diagnosis not present

## 2023-09-28 ENCOUNTER — Other Ambulatory Visit: Payer: Self-pay | Admitting: Family

## 2023-09-28 DIAGNOSIS — E039 Hypothyroidism, unspecified: Secondary | ICD-10-CM | POA: Diagnosis not present

## 2023-10-03 ENCOUNTER — Other Ambulatory Visit (HOSPITAL_COMMUNITY): Payer: Self-pay

## 2023-10-03 ENCOUNTER — Encounter (HOSPITAL_COMMUNITY): Payer: Self-pay

## 2023-10-03 ENCOUNTER — Other Ambulatory Visit: Payer: Self-pay | Admitting: Family Medicine

## 2023-10-03 ENCOUNTER — Encounter: Payer: Self-pay | Admitting: Family Medicine

## 2023-10-03 DIAGNOSIS — E039 Hypothyroidism, unspecified: Secondary | ICD-10-CM | POA: Diagnosis not present

## 2023-10-03 DIAGNOSIS — K912 Postsurgical malabsorption, not elsewhere classified: Secondary | ICD-10-CM | POA: Diagnosis not present

## 2023-10-03 DIAGNOSIS — E042 Nontoxic multinodular goiter: Secondary | ICD-10-CM | POA: Diagnosis not present

## 2023-10-03 MED ORDER — SAVELLA 50 MG PO TABS
50.0000 mg | ORAL_TABLET | Freq: Two times a day (BID) | ORAL | 5 refills | Status: DC
  Filled 2023-10-03: qty 60, 30d supply, fill #0
  Filled 2023-10-30: qty 60, 30d supply, fill #1
  Filled 2023-12-05 (×2): qty 60, 30d supply, fill #2
  Filled 2023-12-31: qty 60, 30d supply, fill #3
  Filled 2024-01-31: qty 60, 30d supply, fill #4
  Filled 2024-03-01: qty 60, 30d supply, fill #5

## 2023-10-04 ENCOUNTER — Other Ambulatory Visit: Payer: Self-pay

## 2023-10-04 ENCOUNTER — Other Ambulatory Visit (HOSPITAL_COMMUNITY): Payer: Self-pay

## 2023-10-05 ENCOUNTER — Other Ambulatory Visit (HOSPITAL_COMMUNITY): Payer: Self-pay

## 2023-10-05 MED ORDER — THYROID 60 MG PO TABS
60.0000 mg | ORAL_TABLET | Freq: Every day | ORAL | 4 refills | Status: AC
  Filled 2023-10-05: qty 90, 90d supply, fill #0
  Filled 2023-12-31: qty 90, 90d supply, fill #1
  Filled 2024-03-24 – 2024-04-01 (×2): qty 90, 90d supply, fill #2

## 2023-10-08 ENCOUNTER — Other Ambulatory Visit (HOSPITAL_COMMUNITY): Payer: Self-pay

## 2023-10-08 ENCOUNTER — Other Ambulatory Visit: Payer: Self-pay

## 2023-10-08 DIAGNOSIS — M6281 Muscle weakness (generalized): Secondary | ICD-10-CM | POA: Diagnosis not present

## 2023-10-08 DIAGNOSIS — M62838 Other muscle spasm: Secondary | ICD-10-CM | POA: Diagnosis not present

## 2023-10-08 DIAGNOSIS — M6289 Other specified disorders of muscle: Secondary | ICD-10-CM | POA: Diagnosis not present

## 2023-10-08 DIAGNOSIS — K5902 Outlet dysfunction constipation: Secondary | ICD-10-CM | POA: Diagnosis not present

## 2023-10-08 DIAGNOSIS — R150 Incomplete defecation: Secondary | ICD-10-CM | POA: Diagnosis not present

## 2023-10-12 ENCOUNTER — Other Ambulatory Visit: Payer: Self-pay | Admitting: Family Medicine

## 2023-10-12 DIAGNOSIS — Z1231 Encounter for screening mammogram for malignant neoplasm of breast: Secondary | ICD-10-CM

## 2023-10-15 ENCOUNTER — Encounter

## 2023-10-19 ENCOUNTER — Other Ambulatory Visit: Payer: Self-pay

## 2023-10-19 ENCOUNTER — Other Ambulatory Visit (HOSPITAL_COMMUNITY): Payer: Self-pay

## 2023-10-22 DIAGNOSIS — K5902 Outlet dysfunction constipation: Secondary | ICD-10-CM | POA: Diagnosis not present

## 2023-10-22 DIAGNOSIS — M6289 Other specified disorders of muscle: Secondary | ICD-10-CM | POA: Diagnosis not present

## 2023-10-22 DIAGNOSIS — M62838 Other muscle spasm: Secondary | ICD-10-CM | POA: Diagnosis not present

## 2023-10-22 DIAGNOSIS — L910 Hypertrophic scar: Secondary | ICD-10-CM | POA: Diagnosis not present

## 2023-10-22 DIAGNOSIS — M6281 Muscle weakness (generalized): Secondary | ICD-10-CM | POA: Diagnosis not present

## 2023-10-28 NOTE — Assessment & Plan Note (Signed)
 Supplement and monitor

## 2023-10-28 NOTE — Assessment & Plan Note (Signed)
 Well controlled, no changes to meds. Encouraged heart healthy diet such as the DASH diet and exercise as tolerated.

## 2023-10-28 NOTE — Assessment & Plan Note (Signed)
 Hydrate and monitor

## 2023-10-28 NOTE — Assessment & Plan Note (Signed)
On NP Thyroid and being followed by endocrinology

## 2023-10-28 NOTE — Assessment & Plan Note (Signed)
 hgba1c acceptable, minimize simple carbs. Increase exercise as tolerated. Continue current meds

## 2023-10-28 NOTE — Assessment & Plan Note (Signed)
 Encourage heart healthy diet such as MIND or DASH diet, increase exercise, avoid trans fats, simple carbohydrates and processed foods, consider a krill or fish or flaxseed oil cap daily.

## 2023-10-28 NOTE — Assessment & Plan Note (Signed)
 Continues to try and eat well and hydrate daily. Will monitor status

## 2023-10-29 ENCOUNTER — Encounter: Payer: Self-pay | Admitting: Family Medicine

## 2023-10-29 ENCOUNTER — Ambulatory Visit: Payer: Commercial Managed Care - PPO | Admitting: Family Medicine

## 2023-10-29 VITALS — BP 124/86 | HR 56 | Resp 16 | Ht 64.0 in | Wt 188.4 lb

## 2023-10-29 DIAGNOSIS — E538 Deficiency of other specified B group vitamins: Secondary | ICD-10-CM

## 2023-10-29 DIAGNOSIS — Z9884 Bariatric surgery status: Secondary | ICD-10-CM | POA: Diagnosis not present

## 2023-10-29 DIAGNOSIS — R0600 Dyspnea, unspecified: Secondary | ICD-10-CM | POA: Diagnosis not present

## 2023-10-29 DIAGNOSIS — I7300 Raynaud's syndrome without gangrene: Secondary | ICD-10-CM | POA: Diagnosis not present

## 2023-10-29 DIAGNOSIS — E1169 Type 2 diabetes mellitus with other specified complication: Secondary | ICD-10-CM | POA: Diagnosis not present

## 2023-10-29 DIAGNOSIS — E559 Vitamin D deficiency, unspecified: Secondary | ICD-10-CM

## 2023-10-29 DIAGNOSIS — E669 Obesity, unspecified: Secondary | ICD-10-CM | POA: Diagnosis not present

## 2023-10-29 DIAGNOSIS — I10 Essential (primary) hypertension: Secondary | ICD-10-CM | POA: Diagnosis not present

## 2023-10-29 DIAGNOSIS — E782 Mixed hyperlipidemia: Secondary | ICD-10-CM | POA: Diagnosis not present

## 2023-10-29 DIAGNOSIS — M791 Myalgia, unspecified site: Secondary | ICD-10-CM | POA: Diagnosis not present

## 2023-10-29 DIAGNOSIS — E039 Hypothyroidism, unspecified: Secondary | ICD-10-CM | POA: Diagnosis not present

## 2023-10-29 NOTE — Patient Instructions (Signed)
 NOW probiotic can pick up at [facility] or order on [employer] Probiotic multiple strains

## 2023-10-29 NOTE — Assessment & Plan Note (Signed)
 Worse recently will repeat an echo as it has been roughly 4 years. Report if symptoms worsen

## 2023-10-29 NOTE — Progress Notes (Signed)
 Subjective:    Patient ID: [name], female    DOB: [birth year], 63 y.o.   MRN: [id number]  No chief complaint on file.   HPI Patient is in today for follow up on chronic medical concerns. No recent febrile illness or hospitalizations. Denies CP/palp/HA/congestion/fevers/GI or GU c/o. Taking meds as prescribed. She is worried about her Raynaud's her fingers, feet and breasts are frequently purple and painful. She is frustrated with weakness and fatigue. Wonders if she has a vitamin deficiency or an underlying cardiac concern.  Past Medical History:  Diagnosis Date   Acute bronchitis 05/25/2016   Anxiety    Arthritis    Chronic pain syndrome 01/06/2013   Chronic rhinosinusitis    Colon polyps    Cough 01/06/2013   Depression    Diabetes (HCC) 01/06/2013   Diabetes mellitus type 2 in obese 01/06/2013   Sees Dr Nancyann Ebbing for eye exam Does not see podiatry, foot exam today unremarkable except for thick cracking skin on heals  pt denies    Dyspnea    with exertion    Dysuria 05/25/2016   Edema 01/06/2013   Epistaxis 05/25/2016   Fibroid, uterine    Fibromyalgia    GERD (gastroesophageal reflux disease)    Glaucoma 03/2012   History of kidney stones    Hoarseness of voice 05/11/2013   Hyperglycemia 04/13/2013   Hyperlipemia, mixed 06/06/2007   Qualifier: Diagnosis of  By: Georgian ROSALEA CHARM Lamar She feels Lipitor caused increased low back pain and weakness     Hyperlipidemia    Hypertension    Hypokalemia 02/05/2013   Hypothyroidism    IBS (irritable bowel syndrome) 02/13/2011   Low back pain 03/03/2013   Muscle cramp 05/22/2014   Obesity    Obesity, unspecified 05/11/2013   OSA (obstructive sleep apnea)    cpap    Pain in joint, shoulder region 06/27/2015   Pre-diabetes    Raynaud disease 06/16/2013   Sleep apnea    Thyroid  disease    hypothyroidism   Vocal fold nodules     Past Surgical History:  Procedure Laterality Date   ABDOMINAL HYSTERECTOMY     ANAL  RECTAL MANOMETRY N/A 03/22/2022   Procedure: ANO RECTAL MANOMETRY;  Surgeon: Saintclair Jasper, MD;  Location: [facility];  Service: Gastroenterology;  Laterality: N/A;   BREAST EXCISIONAL BIOPSY Right    at age 60   BREAST SURGERY  lump remove breast   age 86 yrs   CHOLECYSTECTOMY     colonoscopoy      CYSTECTOMY     DILATION AND CURETTAGE OF UTERUS     x2   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   Procedure: LAPAROSCOPIC ROUX-EN-Y GASTRIC BYPASS WITH UPPER ENDOSCOPY;  Surgeon: Tanda Locus, MD;  Location: [facility];  Service: General;  Laterality: N/A;   HERNIA REPAIR  5/22   I & D EXTREMITY Left 04/11/2018   Procedure: DEBIDMENT DISTAL INTERPHALANGEAL LEFT MIDDLE;  Surgeon: Murrell Kuba, MD;  Location: [facility];  Service: Orthopedics;  Laterality: Left;   INCISIONAL HERNIA REPAIR  08/10/2020   Procedure: LAPAROSCOPIC PRIMARY REPAIR OF INCISIONAL HERNIA;  Surgeon: Tanda Locus, MD;  Location: [facility];  Service: General;;   MASS EXCISION Left 04/11/2018   Procedure: EXCISION MASS;  Surgeon: Murrell Kuba, MD;  Location: [facility];  Service: Orthopedics;  Laterality: Left;   NEPHROLITHOTOMY  10/14/2021   OOPHORECTOMY     POLYPECTOMY     ROTATOR CUFF REPAIR  SHOULDER ARTHROSCOPY WITH ROTATOR CUFF REPAIR Right 12/21/2022   Procedure: Right shoulder arthroscopy, debridement, subacromial decompression, distal clavicle resection, rotator cuff repair;  Surgeon: Melita Drivers, MD;  Location: [facility];  Service: Orthopedics;  Laterality: Right;   UPPER GI ENDOSCOPY N/A 08/10/2020   Procedure: UPPER GI ENDOSCOPY;  Surgeon: Tanda Locus, MD;  Location: [facility];  Service: General;  Laterality: N/A;    Family History  Problem Relation Age of Onset   Alcohol abuse Father    Cancer Father        renal and colon   Hyperlipidemia Father    Hypertension Father    Stroke Father    Heart disease Father    Cancer Paternal Grandfather        colon   Diabetes Other    High blood  pressure Mother    High Cholesterol Mother    Kidney disease Mother    Depression Mother    Anxiety disorder Mother    Obesity Mother    Hypertension Mother    Breast cancer Other 73   Diabetes Sister    Diabetes Brother    Arthritis Maternal Grandmother    Heart disease Paternal Grandmother     Social History   Socioeconomic History   Marital status: Significant Other    Spouse name: Not on file   Number of children: 0   Years of education: Not on file   Highest education level: 12th grade  Occupational History   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  Tobacco Use   Smoking status: Former    Current packs/day: 0.00    Average packs/day: 0.5 packs/day for 30.0 years (15.0 ttl pk-yrs)    Types: Cigarettes    Start date: 04/24/1973    Quit date: 04/25/2003    Years since quitting: 20.5   Smokeless tobacco: Never   Tobacco comments:    smoked since age 19  Vaping Use   Vaping status: Never Used  Substance and Sexual Activity   Alcohol use: No   Drug use: Never   Sexual activity: Not Currently    Partners: Male    Birth control/protection: None  Other Topics Concern   Not on file  Social History Narrative   Endo-- Dr Von   ENT--Dr shoemaker   GI--Dr Buccini   Pulm--Dr Clance   Rheum--Dr Everlean         Social Drivers of Health   Financial Resource Strain: Low Risk  (10/22/2023)   Overall Financial Resource Strain (CARDIA)    Difficulty of Paying Living Expenses: Not hard at all  Food Insecurity: No Food Insecurity (10/22/2023)   Hunger Vital Sign    Worried About Running Out of Food in the Last Year: Never true    Ran Out of Food in the Last Year: Never true  Transportation Needs: No Transportation Needs (10/22/2023)   PRAPARE - Administrator, Civil Service (Medical): No    Lack of Transportation (Non-Medical): No  Physical Activity: Sufficiently Active (10/22/2023)   Exercise Vital Sign    Days of Exercise per Week: 3 days    Minutes of Exercise  per Session: 70 min  Stress: No Stress Concern Present (10/22/2023)   [employer] of Occupational Health - Occupational Stress Questionnaire    Feeling of Stress: Only a little  Social Connections: Moderately Isolated (10/22/2023)   Social Connection and Isolation Panel    Frequency of Communication with Friends and Family: Once a week    Frequency of Social  Gatherings with Friends and Family: More than three times a week    Attends Religious Services: Never    Database administrator or Organizations: No    Attends Engineer, structural: Not on file    Marital Status: Living with partner  Intimate Partner Violence: Not on file    Outpatient Medications Prior to Visit  Medication Sig Dispense Refill   acarbose  (PRECOSE ) 50 MG tablet Take 1 tablet (50 mg total) by mouth 3 (three) times daily with meals. 90 tablet 11   acetaminophen  (TYLENOL ) 325 MG tablet Take 650 mg by mouth every 6 (six) hours as needed for moderate pain.     albuterol  (VENTOLIN  HFA) 108 (90 Base) MCG/ACT inhaler Inhale 2 puffs into the lungs every 4 (four) hours as needed for wheezing or shortness of breath. 18 g 0   bisacodyl  (DULCOLAX) 5 MG EC tablet Take 20 mg by mouth daily as needed for moderate constipation.     busPIRone  (BUSPAR ) 10 MG tablet Take 1 tablet (10 mg total) by mouth 3 (three) times daily. 90 tablet 0   Evolocumab  (REPATHA  SURECLICK) 140 MG/ML SOAJ Inject 140 mg into the skin every 14 (fourteen) days. 6 mL 3   furosemide  (LASIX ) 20 MG tablet Take 1 tablet (20 mg total) by mouth daily as needed. 90 tablet 1   gabapentin  (NEURONTIN ) 100 MG capsule Take 1-3 capsules (100-300 mg total) by mouth at bedtime. 90 capsule 3   ipratropium (ATROVENT ) 0.06 % nasal spray Place 2 sprays into both nostrils 3 (three) times daily as needed for rhinitis 15 mL 12   latanoprost  (XALATAN ) 0.005 % ophthalmic solution Place 1 drop into both eyes every other day at night 2.5 mL 2   loratadine  (CLARITIN ) 10 MG  tablet Take 0.5 tablets (5 mg total) by mouth daily as needed for allergies. Thurs, Sat, and Sun 36 tablet 3   Menthol, Topical Analgesic, (BIOFREEZE EX) Apply 1 Application topically 3 (three) times a week. Thurs, Sat, and Sun. Rub on shoulders     Menthol, Topical Analgesic, (ICY HOT EX) Apply 1 Application topically 3 (three) times a week. Thurs, Sat, Sun. Rub on lower back     metoprolol  tartrate (LOPRESSOR ) 25 MG tablet Take 1 tablet (25 mg total) by mouth 2 (two) times daily. 180 tablet 1   Milnacipran  (SAVELLA ) 50 MG TABS tablet Take 1 tablet (50 mg total) by mouth 2 (two) times daily. 60 tablet 5   modafinil  (PROVIGIL ) 200 MG tablet Take 2 tablets (400 mg total) by mouth daily. 60 tablet 5   Pediatric Multivit-Minerals (FLINTSTONES COMPLETE) CHEW Chew 2 tablets by mouth daily.     spironolactone  (ALDACTONE ) 50 MG tablet Take 1 tablet (50 mg total) by mouth 3 (three) times daily. 270 tablet 3   thyroid  (ARMOUR) 60 MG tablet Take 1 tablet (60 mg total) by mouth once daily. 90 tablet 4   Vitamin D , Ergocalciferol , (DRISDOL ) 1.25 MG (50000 UNIT) CAPS capsule Take 1 capsule (50,000 Units total) by mouth every 7 (seven) days. 12 capsule 1   cefdinir  (OMNICEF ) 300 MG capsule Take 1 capsule (300 mg total) by mouth 2 (two) times daily. 10 capsule 0   thyroid  (NP THYROID ) 90 MG tablet Take 1 tablet (90 mg total) by mouth daily. 90 tablet 1   No facility-administered medications prior to visit.    Allergies  Allergen Reactions   Bupropion  Other (See Comments)   Crestor  [Rosuvastatin ] Other (See Comments)    Myalgias and  rising CPK   Losartan  Cough   Atorvastatin      myalgias   Simvastatin      NDC Code:16714068101 NDC Code:16714068101 NDC Rniz:[id number] Myalgias (Muscle Pain)   Amoxicillin Rash   Aspirin  Nausea And Vomiting and Other (See Comments)    GI upset    Codeine Rash   Cyclobenzaprine      Patient does not tolerate   Erythromycin Rash   Penicillins Rash    Reaction: 15  years    Review of Systems  Constitutional:  Positive for malaise/fatigue. Negative for fever.  HENT:  Negative for congestion.   Eyes:  Negative for blurred vision.  Respiratory:  Positive for shortness of breath.   Cardiovascular:  Negative for chest pain, palpitations and leg swelling.  Gastrointestinal:  Negative for abdominal pain, blood in stool and nausea.  Genitourinary:  Negative for dysuria and frequency.  Musculoskeletal:  Positive for joint pain and myalgias. Negative for falls.  Skin:  Negative for rash.  Neurological:  Negative for dizziness, loss of consciousness and headaches.  Endo/Heme/Allergies:  Negative for environmental allergies.  Psychiatric/Behavioral:  Negative for depression. The patient is not nervous/anxious.        Objective:    Physical Exam Constitutional:      General: She is not in acute distress.    Appearance: Normal appearance. She is well-developed. She is not toxic-appearing.  HENT:     Head: Normocephalic and atraumatic.     Right Ear: External ear normal.     Left Ear: External ear normal.     Nose: Nose normal.  Eyes:     General:        Right eye: No discharge.        Left eye: No discharge.     Conjunctiva/sclera: Conjunctivae normal.  Neck:     Thyroid : No thyromegaly.  Cardiovascular:     Rate and Rhythm: Normal rate and regular rhythm.     Heart sounds: Normal heart sounds. No murmur heard. Pulmonary:     Effort: Pulmonary effort is normal. No respiratory distress.     Breath sounds: Normal breath sounds.  Abdominal:     General: Bowel sounds are normal.     Palpations: Abdomen is soft.     Tenderness: There is no abdominal tenderness. There is no guarding.  Musculoskeletal:        General: Normal range of motion.     Cervical back: Neck supple.     Comments: Fingers all violaceous  Lymphadenopathy:     Cervical: No cervical adenopathy.  Skin:    General: Skin is warm and dry.  Neurological:     Mental Status: She  is alert and oriented to person, place, and time.  Psychiatric:        Mood and Affect: Mood normal.        Behavior: Behavior normal.        Thought Content: Thought content normal.        Judgment: Judgment normal.     BP 124/86   Pulse (!) 56   Resp 16   Ht 5' 4 (1.626 m)   Wt 188 lb 6.4 oz (85.5 kg)   SpO2 98%   BMI 32.34 kg/m  Wt Readings from Last 3 Encounters:  10/29/23 188 lb 6.4 oz (85.5 kg)  05/28/23 195 lb 3.2 oz (88.5 kg)  02/13/23 191 lb 9.6 oz (86.9 kg)    Diabetic Foot Exam - Simple   No data filed    Lab  Results  Component Value Date   WBC 5.3 05/28/2023   HGB 14.3 05/28/2023   HCT 42.5 05/28/2023   PLT 354.0 05/28/2023   GLUCOSE 85 05/28/2023   CHOL 116 05/28/2023   TRIG 114.0 05/28/2023   HDL 60.50 05/28/2023   LDLDIRECT 159.0 11/11/2019   LDLCALC 33 05/28/2023   ALT 23 05/28/2023   AST 25 05/28/2023   NA 140 05/28/2023   K 4.5 05/28/2023   CL 103 05/28/2023   CREATININE 0.82 05/28/2023   BUN 12 05/28/2023   CO2 27 05/28/2023   TSH 0.46 05/28/2023   HGBA1C 6.0 05/28/2023   MICROALBUR 2.16 (H) 01/06/2013    Lab Results  Component Value Date   TSH 0.46 05/28/2023   Lab Results  Component Value Date   WBC 5.3 05/28/2023   HGB 14.3 05/28/2023   HCT 42.5 05/28/2023   MCV 91.2 05/28/2023   PLT 354.0 05/28/2023   Lab Results  Component Value Date   NA 140 05/28/2023   K 4.5 05/28/2023   CO2 27 05/28/2023   GLUCOSE 85 05/28/2023   BUN 12 05/28/2023   CREATININE 0.82 05/28/2023   BILITOT 0.2 05/28/2023   ALKPHOS 107 05/28/2023   AST 25 05/28/2023   ALT 23 05/28/2023   PROT 6.5 05/28/2023   ALBUMIN 4.2 05/28/2023   CALCIUM  9.2 05/28/2023   ANIONGAP 11 12/11/2022   EGFR 58 (L) 06/22/2020   GFR 76.62 05/28/2023   Lab Results  Component Value Date   CHOL 116 05/28/2023   Lab Results  Component Value Date   HDL 60.50 05/28/2023   Lab Results  Component Value Date   LDLCALC 33 05/28/2023   Lab Results  Component Value  Date   TRIG 114.0 05/28/2023   Lab Results  Component Value Date   CHOLHDL 2 05/28/2023   Lab Results  Component Value Date   HGBA1C 6.0 05/28/2023       Assessment & Plan:  Gastric bypass status for obesity Assessment & Plan: Continues to try and eat well and hydrate daily. Will monitor status  Orders: -     CBC with Differential/Platelet -     Magnesium  -     Vitamin B1 -     Zinc   Hyperlipemia, mixed Assessment & Plan: Encourage heart healthy diet such as MIND or DASH diet, increase exercise, avoid trans fats, simple carbohydrates and processed foods, consider a krill or fish or flaxseed oil cap daily.   Orders: -     Lipid panel  Hypertension, unspecified type Assessment & Plan: Well controlled, no changes to meds. Encouraged heart healthy diet such as the DASH diet and exercise as tolerated.     Hypothyroidism, unspecified type Assessment & Plan: On NP Thyroid  and being followed by endocrinology  Orders: -     TSH  Myalgia Assessment & Plan: Hydrate and monitor  Orders: -     Magnesium   Type 2 diabetes mellitus with obesity (HCC) Assessment & Plan: hgba1c acceptable, minimize simple carbs. Increase exercise as tolerated. Continue current meds   Orders: -     Comprehensive metabolic panel with GFR -     Hemoglobin A1c  Vitamin B12 deficiency Assessment & Plan: Supplement and monitor   Orders: -     Vitamin B12  Vitamin D  deficiency Assessment & Plan: Supplement and monitor   Orders: -     VITAMIN D  25 Hydroxy (Vit-D Deficiency, Fractures)  Dyspnea, unspecified type Assessment & Plan: Worse recently will repeat an echo  as it has been roughly 4 years. Report if symptoms worsen  Orders: -     ECHOCARDIOGRAM COMPLETE; Future  Raynaud's phenomenon without gangrene Assessment & Plan: Symptomatic hands, feet and breasts.      Harlene Horton, MD

## 2023-10-29 NOTE — Assessment & Plan Note (Signed)
 Symptomatic hands, feet and breasts.

## 2023-10-30 ENCOUNTER — Other Ambulatory Visit: Payer: Self-pay

## 2023-10-30 ENCOUNTER — Ambulatory Visit: Payer: Self-pay | Admitting: Family Medicine

## 2023-10-30 DIAGNOSIS — E119 Type 2 diabetes mellitus without complications: Secondary | ICD-10-CM | POA: Diagnosis not present

## 2023-10-30 DIAGNOSIS — H40013 Open angle with borderline findings, low risk, bilateral: Secondary | ICD-10-CM | POA: Diagnosis not present

## 2023-10-30 DIAGNOSIS — H524 Presbyopia: Secondary | ICD-10-CM | POA: Diagnosis not present

## 2023-10-30 DIAGNOSIS — E60 Dietary zinc deficiency: Secondary | ICD-10-CM

## 2023-10-30 LAB — LIPID PANEL
Cholesterol: 121 mg/dL (ref 0–200)
HDL: 64.7 mg/dL (ref >39.00)
LDL Cholesterol: 37 mg/dL (ref 0–99)
NonHDL: 56.62
Total CHOL/HDL Ratio: 2
Triglycerides: 98 mg/dL (ref 0.0–149.0)
VLDL: 19.6 mg/dL (ref 0.0–40.0)

## 2023-10-30 LAB — CBC WITH DIFFERENTIAL/PLATELET
Basophils Absolute: 0 K/uL (ref 0.0–0.1)
Basophils Relative: 0.9 % (ref 0.0–3.0)
Eosinophils Absolute: 0.2 K/uL (ref 0.0–0.7)
Eosinophils Relative: 4.1 % (ref 0.0–5.0)
HCT: 40.7 % (ref 36.0–46.0)
Hemoglobin: 13.7 g/dL (ref 12.0–15.0)
Lymphocytes Relative: 37 % (ref 12.0–46.0)
Lymphs Abs: 1.5 K/uL (ref 0.7–4.0)
MCHC: 33.5 g/dL (ref 30.0–36.0)
MCV: 89.5 fl (ref 78.0–100.0)
Monocytes Absolute: 0.4 K/uL (ref 0.1–1.0)
Monocytes Relative: 8.6 % (ref 3.0–12.0)
Neutro Abs: 2 K/uL (ref 1.4–7.7)
Neutrophils Relative %: 49.4 % (ref 43.0–77.0)
Platelets: 319 K/uL (ref 150.0–400.0)
RBC: 4.55 Mil/uL (ref 3.87–5.11)
RDW: 13.7 % (ref 11.5–15.5)
WBC: 4.1 K/uL (ref 4.0–10.5)

## 2023-10-30 LAB — COMPREHENSIVE METABOLIC PANEL WITH GFR
ALT: 39 U/L — ABNORMAL HIGH (ref 0–35)
AST: 33 U/L (ref 0–37)
Albumin: 4.2 g/dL (ref 3.5–5.2)
Alkaline Phosphatase: 97 U/L (ref 39–117)
BUN: 11 mg/dL (ref 6–23)
CO2: 29 meq/L (ref 19–32)
Calcium: 9.2 mg/dL (ref 8.4–10.5)
Chloride: 103 meq/L (ref 96–112)
Creatinine, Ser: 0.79 mg/dL (ref 0.40–1.20)
GFR: 79.89 mL/min (ref >60.00)
Glucose, Bld: 91 mg/dL (ref 70–99)
Potassium: 4.5 meq/L (ref 3.5–5.1)
Sodium: 140 meq/L (ref 135–145)
Total Bilirubin: 0.2 mg/dL (ref 0.2–1.2)
Total Protein: 6.5 g/dL (ref 6.0–8.3)

## 2023-10-30 LAB — MAGNESIUM: Magnesium: 2.2 mg/dL (ref 1.5–2.5)

## 2023-10-30 LAB — VITAMIN B12: Vitamin B-12: 482 pg/mL (ref 211–911)

## 2023-10-30 LAB — TSH: TSH: 1.01 u[IU]/mL (ref 0.35–5.50)

## 2023-10-30 LAB — HEMOGLOBIN A1C: Hgb A1c MFr Bld: 6.1 % (ref 4.6–6.5)

## 2023-10-30 LAB — VITAMIN D 25 HYDROXY (VIT D DEFICIENCY, FRACTURES): VITD: 49.04 ng/mL (ref 30.00–100.00)

## 2023-10-30 NOTE — Progress Notes (Signed)
Patient reviewed via MyChart.

## 2023-10-31 LAB — ZINC: Zinc: 58 ug/dL — ABNORMAL LOW (ref 60–130)

## 2023-11-02 LAB — VITAMIN B1: Vitamin B1 (Thiamine): 8 nmol/L (ref 8–30)

## 2023-11-03 ENCOUNTER — Other Ambulatory Visit (HOSPITAL_COMMUNITY): Payer: Self-pay

## 2023-11-05 ENCOUNTER — Other Ambulatory Visit: Payer: Self-pay

## 2023-11-05 ENCOUNTER — Encounter (INDEPENDENT_AMBULATORY_CARE_PROVIDER_SITE_OTHER): Payer: Self-pay

## 2023-11-06 ENCOUNTER — Other Ambulatory Visit: Payer: Self-pay

## 2023-11-06 DIAGNOSIS — M6289 Other specified disorders of muscle: Secondary | ICD-10-CM | POA: Diagnosis not present

## 2023-11-06 DIAGNOSIS — L910 Hypertrophic scar: Secondary | ICD-10-CM | POA: Diagnosis not present

## 2023-11-06 DIAGNOSIS — M6281 Muscle weakness (generalized): Secondary | ICD-10-CM | POA: Diagnosis not present

## 2023-11-06 DIAGNOSIS — K5902 Outlet dysfunction constipation: Secondary | ICD-10-CM | POA: Diagnosis not present

## 2023-11-06 DIAGNOSIS — M62838 Other muscle spasm: Secondary | ICD-10-CM | POA: Diagnosis not present

## 2023-11-07 ENCOUNTER — Other Ambulatory Visit: Payer: Self-pay | Admitting: Obstetrics and Gynecology

## 2023-11-07 ENCOUNTER — Encounter: Payer: Self-pay | Admitting: Family Medicine

## 2023-11-07 ENCOUNTER — Ambulatory Visit
Admission: RE | Admit: 2023-11-07 | Discharge: 2023-11-07 | Disposition: A | Discharge status: Home / Self Care | Source: Ambulatory Visit

## 2023-11-07 ENCOUNTER — Encounter: Payer: Self-pay | Admitting: Cardiology

## 2023-11-07 ENCOUNTER — Ambulatory Visit (HOSPITAL_COMMUNITY)
Admission: RE | Admit: 2023-11-07 | Discharge: 2023-11-07 | Disposition: A | Discharge status: Home / Self Care | Source: Ambulatory Visit | Attending: Family Medicine | Admitting: Family Medicine

## 2023-11-07 DIAGNOSIS — Z1231 Encounter for screening mammogram for malignant neoplasm of breast: Secondary | ICD-10-CM

## 2023-11-07 DIAGNOSIS — I517 Cardiomegaly: Secondary | ICD-10-CM | POA: Diagnosis not present

## 2023-11-07 DIAGNOSIS — R0600 Dyspnea, unspecified: Secondary | ICD-10-CM | POA: Insufficient documentation

## 2023-11-07 LAB — ECHOCARDIOGRAM COMPLETE
Area-P 1/2: 5.7 cm2
Est EF: >75
S' Lateral: 2.3 cm

## 2023-11-07 NOTE — Telephone Encounter (Signed)
 Left voice message to call back 7/16

## 2023-11-12 DIAGNOSIS — E785 Hyperlipidemia, unspecified: Secondary | ICD-10-CM | POA: Diagnosis not present

## 2023-11-13 ENCOUNTER — Ambulatory Visit: Payer: Self-pay | Admitting: Internal Medicine

## 2023-11-13 ENCOUNTER — Other Ambulatory Visit (HOSPITAL_COMMUNITY): Payer: Self-pay | Admitting: Psychiatry

## 2023-11-13 ENCOUNTER — Other Ambulatory Visit: Payer: Self-pay | Admitting: Family

## 2023-11-13 LAB — LIPID PANEL
Chol/HDL Ratio: 1.7 ratio (ref 0.0–4.4)
Cholesterol, Total: 123 mg/dL (ref 100–199)
HDL: 72 mg/dL (ref >39)
LDL Chol Calc (NIH): 35 mg/dL (ref 0–99)
Triglycerides: 84 mg/dL (ref 0–149)
VLDL Cholesterol Cal: 16 mg/dL (ref 5–40)

## 2023-11-13 LAB — CK: Total CK: 170 U/L (ref 32–182)

## 2023-11-14 ENCOUNTER — Ambulatory Visit: Attending: Cardiovascular Disease | Admitting: Internal Medicine

## 2023-11-14 ENCOUNTER — Encounter: Payer: Self-pay | Admitting: Internal Medicine

## 2023-11-14 ENCOUNTER — Other Ambulatory Visit (HOSPITAL_COMMUNITY): Payer: Self-pay

## 2023-11-14 ENCOUNTER — Other Ambulatory Visit: Payer: Self-pay

## 2023-11-14 VITALS — BP 136/80 | HR 83 | Ht 63.0 in | Wt 190.0 lb

## 2023-11-14 DIAGNOSIS — T466X5A Adverse effect of antihyperlipidemic and antiarteriosclerotic drugs, initial encounter: Secondary | ICD-10-CM

## 2023-11-14 DIAGNOSIS — E785 Hyperlipidemia, unspecified: Secondary | ICD-10-CM | POA: Diagnosis not present

## 2023-11-14 DIAGNOSIS — T466X5D Adverse effect of antihyperlipidemic and antiarteriosclerotic drugs, subsequent encounter: Secondary | ICD-10-CM

## 2023-11-14 DIAGNOSIS — I2510 Atherosclerotic heart disease of native coronary artery without angina pectoris: Secondary | ICD-10-CM

## 2023-11-14 DIAGNOSIS — E7801 Familial hypercholesterolemia: Secondary | ICD-10-CM | POA: Diagnosis not present

## 2023-11-14 DIAGNOSIS — G720 Drug-induced myopathy: Secondary | ICD-10-CM | POA: Diagnosis not present

## 2023-11-14 MED ORDER — BUSPIRONE HCL 10 MG PO TABS
10.0000 mg | ORAL_TABLET | Freq: Three times a day (TID) | ORAL | 0 refills | Status: DC
  Filled 2023-11-14: qty 90, 30d supply, fill #0

## 2023-11-14 NOTE — Patient Instructions (Signed)
 Medication Instructions:  Your physician recommends that you continue on your current medications as directed. Please refer to the Current Medication list given to you today.  *If you need a refill on your cardiac medications before your next appointment, please call your pharmacy*  Follow-Up: At [facility], you and your health needs are our priority.  As part of our continuing mission to provide you with exceptional heart care, our providers are all part of one team.  This team includes your primary Cardiologist (physician) and Advanced Practice Providers or APPs (Physician Assistants and Nurse Practitioners) who all work together to provide you with the care you need, when you need it.  Your next appointment:   As needed  Provider:   Vinie Maxcy, MD  We recommend signing up for the patient portal called MyChart.  Sign up information is provided on this After Visit Summary.  MyChart is used to connect with patients for Virtual Visits (Telemedicine).  Patients are able to view lab/test results, encounter notes, upcoming appointments, etc.  Non-urgent messages can be sent to your provider as well.   To learn more about what you can do with MyChart, go to [email or web address].

## 2023-11-14 NOTE — Progress Notes (Unsigned)
 LIPID CLINIC CONSULT NOTE  Chief Complaint:  Follow-up dyslipidemia  Primary Care Physician: Domenica Harlene LABOR, MD  Primary Cardiologist:  Kardie Tobb, DO  HPI:  [name] is a 63 y.o. female who is being seen today for the evaluation of dyslipidemia at the request of Domenica Harlene LABOR, MD.  This is a pleasant 63 year old female here for evaluation management of dyslipidemia.  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  More recently she struggled with weight and is actually being evaluated by Dr. Camellia Blush for bariatric surgery.  In addition her cholesterol has been elevated, more recently total cholesterol 214, HDL 44, triglycerides 182 and LDL 138.  She denies any prior history of coronary disease although does have risk factors including hypertension, type 2 diabetes and a family history of heart disease.  She had previously tried a number of statins but is struggled with myalgias and possibly has fibromyalgia.  She had seen a rheumatologist for work-up for elevated CKs.  At one point she had a CK up in the 2400 but in general his CK tends to be around in the mid 200s without any statin therapy.  Currently she is not on any treatments.  06/30/2020  [name] seen today for follow-up.  Overall her cholesterol unfortunately has gone up.  She had to stop her statin and her ezetimibe .  The statin she felt was causing worsening skeletal myopathy.  Her CKs have ranged in the 200s.  Recent recheck does show some improvement with the CK now of 186.  LDL cholesterol however has risen from 138 up to 175, with total cholesterol 253, triglycerides 148 and HDL 41.  She understands the importance of lowering her cholesterol further.  Her liver enzymes have been normal.  We discussed today about the possibility of a PCSK9 inhibitor which I think is necessary given her high cholesterol.  I wonder if she could have a familial hyperlipidemia however I suspect primarily that  this is dietary.  She is interested in FH testing and requested a genetic test today.  She is planning on pursuing weight loss therapy.  She does have known coronary artery disease as previously mentioned, however it was mild with 0 coronary calcium  and a small amount of noncalcified plaque in the RCA.  Seen on a coronary CT in January 2022.  She was also describing recurrent chest pain symptoms.  She is on Raynaud's is seen but does not report it helped her symptoms.  She is scheduled to follow-up with Dr. Sheena on March 24.  10/22/2020  [name] returns today for follow-up.  Overall she continues to do well.  Her cholesterols come down significantly.  Total is now 77 with HDL 43, LDL 17 and triglycerides 80.  She is on monotherapy Repatha  140 mg every 2 weeks.  She has lost weight and had previous gastric bypass surgery.  Since May she is now down about 12 pounds.  Will need to follow this closely as her LDL may get into the single digits or perhaps a negative calculated number.  We might consider discontinuing her therapy at that point or perhaps switching her to lower dose Praluent.  07/11/2021  [name] is seen today in follow-up.  Cholesterol continues to be low with total 102, triglycerides 69, HDL 58 and LDL 30 on Repatha .  She denies any chest pain or worsening shortness of breath.  She has had a recent increase in her CK's  which had near normalized.  This is not likely due to the Repatha  however the etiology remains unclear.  She is also had some protein developing in her urine as well as some blood noted as well.  She has an underlying history of autoimmune disease but no diagnosis of lupus, which would come to mind for this.  08/23/2022  [name] is seen today in follow-up.  She continues to do well on Repatha .  Her CK was normal 2 months ago.  Her lipids continue to be well-controlled.  Total 124, triglycerides 111, HDL 69 and LDL 33 on Repatha .  No side effects that she can tell.  She is  continue to lose weight after bariatric surgery.  Recently she has had an issue with low copper  levels.  There is also been some other issues with hypoglycemia but she is working with dietitians on that.  PMHx:  Past Medical History:  Diagnosis Date   Acute bronchitis 05/25/2016   Anxiety    Arthritis    Chronic pain syndrome 01/06/2013   Chronic rhinosinusitis    Colon polyps    Cough 01/06/2013   Depression    Diabetes (HCC) 01/06/2013   Diabetes mellitus type 2 in obese 01/06/2013   Sees Dr Nancyann Ebbing for eye exam Does not see podiatry, foot exam today unremarkable except for thick cracking skin on heals  pt denies    Dyspnea    with exertion    Dysuria 05/25/2016   Edema 01/06/2013   Epistaxis 05/25/2016   Fibroid, uterine    Fibromyalgia    GERD (gastroesophageal reflux disease)    Glaucoma 03/2012   History of kidney stones    Hoarseness of voice 05/11/2013   Hyperglycemia 04/13/2013   Hyperlipemia, mixed 06/06/2007   Qualifier: Diagnosis of  By: Georgian ROSALEA CHARM Lamar She feels Lipitor caused increased low back pain and weakness     Hyperlipidemia    Hypertension    Hypokalemia 02/05/2013   Hypothyroidism    IBS (irritable bowel syndrome) 02/13/2011   Low back pain 03/03/2013   Muscle cramp 05/22/2014   Obesity    Obesity, unspecified 05/11/2013   OSA (obstructive sleep apnea)    cpap    Pain in joint, shoulder region 06/27/2015   Pre-diabetes    Raynaud disease 06/16/2013   Sleep apnea    Thyroid  disease    hypothyroidism   Vocal fold nodules     Past Surgical History:  Procedure Laterality Date   ABDOMINAL HYSTERECTOMY     ANAL RECTAL MANOMETRY N/A 03/22/2022   Procedure: ANO RECTAL MANOMETRY;  Surgeon: Saintclair Jasper, MD;  Location: [facility];  Service: Gastroenterology;  Laterality: N/A;   BREAST EXCISIONAL BIOPSY Right    at age 58   BREAST SURGERY  lump remove breast   age 89 yrs   CHOLECYSTECTOMY     colonoscopoy      CYSTECTOMY     DILATION  AND CURETTAGE OF UTERUS     x2   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   Procedure: LAPAROSCOPIC ROUX-EN-Y GASTRIC BYPASS WITH UPPER ENDOSCOPY;  Surgeon: Tanda Locus, MD;  Location: [facility];  Service: General;  Laterality: N/A;   HERNIA REPAIR  5/22   I & D EXTREMITY Left 04/11/2018   Procedure: DEBIDMENT DISTAL INTERPHALANGEAL LEFT MIDDLE;  Surgeon: Murrell Kuba, MD;  Location: [facility];  Service: Orthopedics;  Laterality: Left;   INCISIONAL HERNIA REPAIR  08/10/2020   Procedure: LAPAROSCOPIC PRIMARY REPAIR OF INCISIONAL HERNIA;  Surgeon: Tanda Locus, MD;  Location: [facility];  Service: General;;   MASS EXCISION Left 04/11/2018   Procedure: EXCISION MASS;  Surgeon: Murrell Kuba, MD;  Location: [facility];  Service: Orthopedics;  Laterality: Left;   NEPHROLITHOTOMY  10/14/2021   OOPHORECTOMY     POLYPECTOMY     ROTATOR CUFF REPAIR     SHOULDER ARTHROSCOPY WITH ROTATOR CUFF REPAIR Right 12/21/2022   Procedure: Right shoulder arthroscopy, debridement, subacromial decompression, distal clavicle resection, rotator cuff repair;  Surgeon: Melita Drivers, MD;  Location: [facility];  Service: Orthopedics;  Laterality: Right;   UPPER GI ENDOSCOPY N/A 08/10/2020   Procedure: UPPER GI ENDOSCOPY;  Surgeon: Tanda Locus, MD;  Location: [facility];  Service: General;  Laterality: N/A;    FAMHx:  Family History  Problem Relation Age of Onset   Alcohol abuse Father    Cancer Father        renal and colon   Hyperlipidemia Father    Hypertension Father    Stroke Father    Heart disease Father    Cancer Paternal Grandfather        colon   Diabetes Other    High blood pressure Mother    High Cholesterol Mother    Kidney disease Mother    Depression Mother    Anxiety disorder Mother    Obesity Mother    Hypertension Mother    Breast cancer Other 57   Diabetes Sister    Diabetes Brother    Arthritis Maternal Grandmother    Heart disease Paternal Grandmother     SOCHx:    reports that she quit smoking about 20 years ago. Her smoking use included cigarettes. She started smoking about 50 years ago. She has a 15 pack-year smoking history. She has never used smokeless tobacco. She reports that she does not drink alcohol and does not use drugs.  ALLERGIES:  Allergies  Allergen Reactions   Bupropion  Other (See Comments)   Crestor  [Rosuvastatin ] Other (See Comments)    Myalgias and rising CPK   Losartan  Cough   Atorvastatin      myalgias   Simvastatin      NDC Code:16714068101 NDC Code:16714068101 NDC Rniz:[id number] Myalgias (Muscle Pain)   Amoxicillin Rash   Aspirin  Nausea And Vomiting and Other (See Comments)    GI upset    Codeine Rash   Cyclobenzaprine      Patient does not tolerate   Erythromycin Rash   Penicillins Rash    Reaction: 15 years    ROS: Pertinent items noted in HPI and remainder of comprehensive ROS otherwise negative.  HOME MEDS: Current Outpatient Medications on File Prior to Visit  Medication Sig Dispense Refill   acarbose  (PRECOSE ) 50 MG tablet Take 1 tablet (50 mg total) by mouth 3 (three) times daily with meals. 90 tablet 11   acetaminophen  (TYLENOL ) 325 MG tablet Take 650 mg by mouth every 6 (six) hours as needed for moderate pain.     albuterol  (VENTOLIN  HFA) 108 (90 Base) MCG/ACT inhaler Inhale 2 puffs into the lungs every 4 (four) hours as needed for wheezing or shortness of breath. 18 g 0   bisacodyl  (DULCOLAX) 5 MG EC tablet Take 20 mg by mouth daily as needed for moderate constipation.     busPIRone  (BUSPAR ) 10 MG tablet Take 1 tablet (10 mg total) by mouth 3 (three) times daily. 90 tablet 0   Evolocumab  (REPATHA  SURECLICK) 140 MG/ML SOAJ Inject 140 mg into the skin every 14 (fourteen) days. 6  mL 3   furosemide  (LASIX ) 20 MG tablet Take 1 tablet (20 mg total) by mouth daily as needed. 90 tablet 1   gabapentin  (NEURONTIN ) 100 MG capsule Take 1-3 capsules (100-300 mg total) by mouth at bedtime. 90 capsule 3   ipratropium  (ATROVENT ) 0.06 % nasal spray Place 2 sprays into both nostrils 3 (three) times daily as needed for rhinitis 15 mL 12   latanoprost  (XALATAN ) 0.005 % ophthalmic solution Place 1 drop into both eyes every other day at night 2.5 mL 2   loratadine  (CLARITIN ) 10 MG tablet Take 0.5 tablets (5 mg total) by mouth daily as needed for allergies. Thurs, Sat, and Sun 36 tablet 3   Menthol, Topical Analgesic, (BIOFREEZE EX) Apply 1 Application topically 3 (three) times a week. Thurs, Sat, and Sun. Rub on shoulders     Menthol, Topical Analgesic, (ICY HOT EX) Apply 1 Application topically 3 (three) times a week. Thurs, Sat, Sun. Rub on lower back     metoprolol  tartrate (LOPRESSOR ) 25 MG tablet Take 1 tablet (25 mg total) by mouth 2 (two) times daily. 180 tablet 1   Milnacipran  (SAVELLA ) 50 MG TABS tablet Take 1 tablet (50 mg total) by mouth 2 (two) times daily. 60 tablet 5   modafinil  (PROVIGIL ) 200 MG tablet Take 2 tablets (400 mg total) by mouth daily. 60 tablet 5   Pediatric Multivit-Minerals (FLINTSTONES COMPLETE) CHEW Chew 2 tablets by mouth daily.     spironolactone  (ALDACTONE ) 50 MG tablet Take 1 tablet (50 mg total) by mouth 3 (three) times daily. 270 tablet 3   thyroid  (ARMOUR) 60 MG tablet Take 1 tablet (60 mg total) by mouth once daily. 90 tablet 4   Vitamin D , Ergocalciferol , (DRISDOL ) 1.25 MG (50000 UNIT) CAPS capsule Take 1 capsule (50,000 Units total) by mouth every 7 (seven) days. 12 capsule 1   No current facility-administered medications on file prior to visit.    LABS/IMAGING: No results found. However, due to the size of the patient record, not all encounters were searched. Please check Results Review for a complete set of results. No results found.  LIPID PANEL:    Component Value Date/Time   CHOL 123 11/12/2023 0811   TRIG 84 11/12/2023 0811   HDL 72 11/12/2023 0811   CHOLHDL 1.7 11/12/2023 0811   CHOLHDL 2 10/29/2023 1623   VLDL 19.6 10/29/2023 1623   LDLCALC 35 11/12/2023  0811   LDLCALC 138 (H) 02/13/2020 0857   LDLDIRECT 159.0 11/11/2019 1125    WEIGHTS: Wt Readings from Last 3 Encounters:  11/14/23 190 lb (86.2 kg)  10/29/23 188 lb 6.4 oz (85.5 kg)  05/28/23 195 lb 3.2 oz (88.5 kg)    VITALS: BP 136/80   Pulse 83   Ht 5' 3 (1.6 m)   Wt 190 lb (86.2 kg)   BMI 33.66 kg/m   EXAM: Deferred  EKG: Deferred  ASSESSMENT: Mixed dyslipidemia Mild noncalcified coronary disease by cardiac CT-04/2020 Statin myalgias,?  Statin myopathy with elevated CKs Family history of coronary disease Type 2 diabetes Hypertension Status post gastric bypass surgery  PLAN: 1.   [name] has done well on Repatha  with marked improvement in her lipids.  She is tolerating without any muscle pains and her CKs have been negative.  She continues to lose weight after bypass surgery but has had an issue with hypoglycemia and low copper  levels.  Otherwise I think she is doing very well.  She exercises regularly without limitations.  Plan follow-up with  me annually or sooner as necessary.  Vinie KYM Maxcy, MD, [facility], FACP  [place]  [facility]  Medical Director of the [facility] Lipid [facility] &  Cardiovascular Risk Reduction Clinic Diplomate of the American Board of Clinical Lipidology Attending Cardiologist  Direct Dial: [phone]  Fax: [phone]  Website:  [email or web address]  Vinie JAYSON Maxcy 11/14/2023, 3:36 PM

## 2023-11-15 ENCOUNTER — Other Ambulatory Visit (HOSPITAL_COMMUNITY): Payer: Self-pay

## 2023-11-15 ENCOUNTER — Telehealth: Payer: Self-pay | Admitting: Pharmacy Technician

## 2023-11-15 NOTE — Telephone Encounter (Signed)
 Pharmacy Patient Advocate Encounter   Received notification from Physician's Office that prior authorization for Repatha  is required/requested.   Insurance verification completed.   The patient is insured through [facility] .   Per test claim: Refill too soon. PA is not needed at this time. Medication was filled 09/18/23. Next eligible fill date is 11/26/23.

## 2023-11-19 ENCOUNTER — Telehealth (HOSPITAL_COMMUNITY): Admitting: Psychiatry

## 2023-11-22 ENCOUNTER — Other Ambulatory Visit (HOSPITAL_COMMUNITY): Payer: Self-pay

## 2023-11-22 ENCOUNTER — Other Ambulatory Visit: Payer: Self-pay

## 2023-11-22 ENCOUNTER — Other Ambulatory Visit: Payer: Self-pay | Admitting: Family

## 2023-11-22 ENCOUNTER — Encounter: Payer: Self-pay | Admitting: Family Medicine

## 2023-11-22 MED ORDER — VITAMIN D (ERGOCALCIFEROL) 1.25 MG (50000 UNIT) PO CAPS
50000.0000 [IU] | ORAL_CAPSULE | ORAL | 1 refills | Status: DC
  Filled 2023-11-22: qty 12, 84d supply, fill #0
  Filled 2024-02-10: qty 12, 84d supply, fill #1

## 2023-11-26 ENCOUNTER — Telehealth (INDEPENDENT_AMBULATORY_CARE_PROVIDER_SITE_OTHER): Admitting: Psychiatry

## 2023-11-26 ENCOUNTER — Other Ambulatory Visit (HOSPITAL_COMMUNITY): Payer: Self-pay

## 2023-11-26 ENCOUNTER — Encounter (HOSPITAL_COMMUNITY): Payer: Self-pay | Admitting: Psychiatry

## 2023-11-26 DIAGNOSIS — M797 Fibromyalgia: Secondary | ICD-10-CM

## 2023-11-26 DIAGNOSIS — F411 Generalized anxiety disorder: Secondary | ICD-10-CM | POA: Diagnosis not present

## 2023-11-26 MED ORDER — BUSPIRONE HCL 10 MG PO TABS
10.0000 mg | ORAL_TABLET | Freq: Three times a day (TID) | ORAL | 1 refills | Status: DC
Start: 2023-11-26 — End: 2024-03-06
  Filled 2023-11-26 – 2023-12-09 (×2): qty 90, 30d supply, fill #0
  Filled 2024-01-08: qty 90, 30d supply, fill #1

## 2023-11-26 NOTE — Progress Notes (Signed)
 [facility] Follow up visit  Patient Identification: [name] MRN:  [id number] Date of Evaluation:  11/26/2023 Referral Source: Primary care Chief Complaint:  follow up anxiety Visit Diagnosis:    ICD-10-CM   1. Generalized anxiety disorder  F41.1     2. Fibromyalgia  M79.7     3. GAD (generalized anxiety disorder)  F41.1     Virtual Visit via Video Note  I connected with [name] on 11/26/23 at  3:00 PM EDT by a video enabled telemedicine application and verified that I am speaking with the correct person using two identifiers.  Location: Patient: home Provider: office   I discussed the limitations of evaluation and management by telemedicine and the availability of in person appointments. The patient expressed understanding and agreed to proceed.      I discussed the assessment and treatment plan with the patient. The patient was provided an opportunity to ask questions and all were answered. The patient agreed with the plan and demonstrated an understanding of the instructions.   The patient was advised to call back or seek an in-person evaluation if the symptoms worsen or if the condition fails to improve as anticipated.  I provided 16 minutes of non-face-to-face time during this encounter.     History of Present Illness: Patient is a 63  years old female lives with her significant other initially referred by primary care physician for establish care for anxiety.  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  Does not have any kids  Patient doing reasonable still has a job stress is managing it.  She takes Provigil  during the daytime for sleepiness also is under treatment for CPAP machine for sleep apnea   Mood and anxiety is manageable increasing BuSpar  to 3 times a day has helped Aggravating factors; job stress, brothers death Modifying factors; gym, yoga Duration since adult life Severity manageable     Past Psychiatric History:  anxiety  Previous Psychotropic Medications: Yes  Effexor , xanax   Past Medical History:  Past Medical History:  Diagnosis Date   Acute bronchitis 05/25/2016   Anxiety    Arthritis    Chronic pain syndrome 01/06/2013   Chronic rhinosinusitis    Colon polyps    Cough 01/06/2013   Depression    Diabetes (HCC) 01/06/2013   Diabetes mellitus type 2 in obese 01/06/2013   Sees Dr Nancyann Ebbing for eye exam Does not see podiatry, foot exam today unremarkable except for thick cracking skin on heals  pt denies    Dyspnea    with exertion    Dysuria 05/25/2016   Edema 01/06/2013   Epistaxis 05/25/2016   Fibroid, uterine    Fibromyalgia    GERD (gastroesophageal reflux disease)    Glaucoma 03/2012   History of kidney stones    Hoarseness of voice 05/11/2013   Hyperglycemia 04/13/2013   Hyperlipemia, mixed 06/06/2007   Qualifier: Diagnosis of  By: Georgian ROSALEA CHARM Lamar She feels Lipitor caused increased low back pain and weakness     Hyperlipidemia    Hypertension    Hypokalemia 02/05/2013   Hypothyroidism    IBS (irritable bowel syndrome) 02/13/2011   Low back pain 03/03/2013   Muscle cramp 05/22/2014   Obesity    Obesity, unspecified 05/11/2013   OSA (obstructive sleep apnea)    cpap    Pain in joint, shoulder region 06/27/2015   Pre-diabetes    Raynaud disease 06/16/2013   Sleep apnea  Thyroid  disease    hypothyroidism   Vocal fold nodules     Past Surgical History:  Procedure Laterality Date   ABDOMINAL HYSTERECTOMY     ANAL RECTAL MANOMETRY N/A 03/22/2022   Procedure: ANO RECTAL MANOMETRY;  Surgeon: Saintclair Jasper, MD;  Location: [facility];  Service: Gastroenterology;  Laterality: N/A;   BREAST EXCISIONAL BIOPSY Right    at age 53   BREAST SURGERY  lump remove breast   age 30 yrs   CHOLECYSTECTOMY     colonoscopoy      CYSTECTOMY     DILATION AND CURETTAGE OF UTERUS     x2   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   Procedure: LAPAROSCOPIC ROUX-EN-Y GASTRIC BYPASS WITH  UPPER ENDOSCOPY;  Surgeon: Tanda Locus, MD;  Location: [facility];  Service: General;  Laterality: N/A;   HERNIA REPAIR  5/22   I & D EXTREMITY Left 04/11/2018   Procedure: DEBIDMENT DISTAL INTERPHALANGEAL LEFT MIDDLE;  Surgeon: Murrell Kuba, MD;  Location: [facility];  Service: Orthopedics;  Laterality: Left;   INCISIONAL HERNIA REPAIR  08/10/2020   Procedure: LAPAROSCOPIC PRIMARY REPAIR OF INCISIONAL HERNIA;  Surgeon: Tanda Locus, MD;  Location: [facility];  Service: General;;   MASS EXCISION Left 04/11/2018   Procedure: EXCISION MASS;  Surgeon: Murrell Kuba, MD;  Location: [facility];  Service: Orthopedics;  Laterality: Left;   NEPHROLITHOTOMY  10/14/2021   OOPHORECTOMY     POLYPECTOMY     ROTATOR CUFF REPAIR     SHOULDER ARTHROSCOPY WITH ROTATOR CUFF REPAIR Right 12/21/2022   Procedure: Right shoulder arthroscopy, debridement, subacromial decompression, distal clavicle resection, rotator cuff repair;  Surgeon: Melita Drivers, MD;  Location: [facility];  Service: Orthopedics;  Laterality: Right;   UPPER GI ENDOSCOPY N/A 08/10/2020   Procedure: UPPER GI ENDOSCOPY;  Surgeon: Tanda Locus, MD;  Location: [facility];  Service: General;  Laterality: N/A;    Family Psychiatric History: dad : alcohol use  Family History:  Family History  Problem Relation Age of Onset   Alcohol abuse Father    Cancer Father        renal and colon   Hyperlipidemia Father    Hypertension Father    Stroke Father    Heart disease Father    Cancer Paternal Grandfather        colon   Diabetes Other    High blood pressure Mother    High Cholesterol Mother    Kidney disease Mother    Depression Mother    Anxiety disorder Mother    Obesity Mother    Hypertension Mother    Breast cancer Other 74   Diabetes Sister    Diabetes Brother    Arthritis Maternal Grandmother    Heart disease Paternal Grandmother     Social History:   Social History   Socioeconomic History   Marital status:  Significant Other    Spouse name: Not on file   Number of children: 0   Years of education: Not on file   Highest education level: 12th grade  Occupational History   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  Tobacco Use   Smoking status: Former    Current packs/day: 0.00    Average packs/day: 0.5 packs/day for 30.0 years (15.0 ttl pk-yrs)    Types: Cigarettes    Start date: 04/24/1973    Quit date: 04/25/2003    Years since quitting: 20.6   Smokeless tobacco: Never   Tobacco comments:    smoked since age  16  Vaping Use   Vaping status: Never Used  Substance and Sexual Activity   Alcohol use: No   Drug use: Never   Sexual activity: Not Currently    Partners: Male    Birth control/protection: None  Other Topics Concern   Not on file  Social History Narrative   Endo-- Dr Von   ENT--Dr shoemaker   GI--Dr Buccini   Pulm--Dr Clance   Rheum--Dr Everlean         Social Drivers of Health   Financial Resource Strain: Low Risk  (10/22/2023)   Overall Financial Resource Strain (CARDIA)    Difficulty of Paying Living Expenses: Not hard at all  Food Insecurity: No Food Insecurity (10/22/2023)   Hunger Vital Sign    Worried About Running Out of Food in the Last Year: Never true    Ran Out of Food in the Last Year: Never true  Transportation Needs: No Transportation Needs (10/22/2023)   PRAPARE - Administrator, Civil Service (Medical): No    Lack of Transportation (Non-Medical): No  Physical Activity: Sufficiently Active (10/22/2023)   Exercise Vital Sign    Days of Exercise per Week: 3 days    Minutes of Exercise per Session: 70 min  Stress: No Stress Concern Present (10/22/2023)   [employer] of Occupational Health - Occupational Stress Questionnaire    Feeling of Stress: Only a little  Social Connections: Moderately Isolated (10/22/2023)   Social Connection and Isolation Panel    Frequency of Communication with Friends and Family: Once a week    Frequency of Social  Gatherings with Friends and Family: More than three times a week    Attends Religious Services: Never    Database administrator or Organizations: No    Attends Engineer, structural: Not on file    Marital Status: Living with partner      Allergies:   Allergies  Allergen Reactions   Bupropion  Other (See Comments)   Crestor  [Rosuvastatin ] Other (See Comments)    Myalgias and rising CPK   Losartan  Cough   Atorvastatin      myalgias   Simvastatin      NDC Code:16714068101 NDC Code:16714068101 NDC Rniz:[id number] Myalgias (Muscle Pain)   Amoxicillin Rash   Aspirin  Nausea And Vomiting and Other (See Comments)    GI upset    Codeine Rash   Cyclobenzaprine      Patient does not tolerate   Erythromycin Rash   Penicillins Rash    Reaction: 15 years    Metabolic Disorder Labs: Lab Results  Component Value Date   HGBA1C 6.1 10/29/2023   MPG 122.63 08/03/2020   MPG 137 02/13/2020   No results found for: PROLACTIN Lab Results  Component Value Date   CHOL 123 11/12/2023   TRIG 84 11/12/2023   HDL 72 11/12/2023   CHOLHDL 1.7 11/12/2023   VLDL 80.3 10/29/2023   LDLCALC 35 11/12/2023   LDLCALC 37 10/29/2023   Lab Results  Component Value Date   TSH 1.01 10/29/2023    Therapeutic Level Labs: No results found for: LITHIUM No results found for: CBMZ No results found for: VALPROATE  Current Medications: Current Outpatient Medications  Medication Sig Dispense Refill   acarbose  (PRECOSE ) 50 MG tablet Take 1 tablet (50 mg total) by mouth 3 (three) times daily with meals. 90 tablet 11   acetaminophen  (TYLENOL ) 325 MG tablet Take 650 mg by mouth every 6 (six) hours as needed for moderate pain.  albuterol  (VENTOLIN  HFA) 108 (90 Base) MCG/ACT inhaler Inhale 2 puffs into the lungs every 4 (four) hours as needed for wheezing or shortness of breath. 18 g 0   bisacodyl  (DULCOLAX) 5 MG EC tablet Take 20 mg by mouth daily as needed for moderate constipation.      busPIRone  (BUSPAR ) 10 MG tablet Take 1 tablet (10 mg total) by mouth 3 (three) times daily. 90 tablet 1   Evolocumab  (REPATHA  SURECLICK) 140 MG/ML SOAJ Inject 140 mg into the skin every 14 (fourteen) days. 6 mL 3   furosemide  (LASIX ) 20 MG tablet Take 1 tablet (20 mg total) by mouth daily as needed. 90 tablet 1   gabapentin  (NEURONTIN ) 100 MG capsule Take 1-3 capsules (100-300 mg total) by mouth at bedtime. 90 capsule 3   ipratropium (ATROVENT ) 0.06 % nasal spray Place 2 sprays into both nostrils 3 (three) times daily as needed for rhinitis 15 mL 12   latanoprost  (XALATAN ) 0.005 % ophthalmic solution Place 1 drop into both eyes every other day at night 2.5 mL 2   loratadine  (CLARITIN ) 10 MG tablet Take 0.5 tablets (5 mg total) by mouth daily as needed for allergies. Thurs, Sat, and Sun 36 tablet 3   Menthol, Topical Analgesic, (BIOFREEZE EX) Apply 1 Application topically 3 (three) times a week. Thurs, Sat, and Sun. Rub on shoulders     Menthol, Topical Analgesic, (ICY HOT EX) Apply 1 Application topically 3 (three) times a week. Thurs, Sat, Sun. Rub on lower back     metoprolol  tartrate (LOPRESSOR ) 25 MG tablet Take 1 tablet (25 mg total) by mouth 2 (two) times daily. 180 tablet 1   Milnacipran  (SAVELLA ) 50 MG TABS tablet Take 1 tablet (50 mg total) by mouth 2 (two) times daily. 60 tablet 5   modafinil  (PROVIGIL ) 200 MG tablet Take 2 tablets (400 mg total) by mouth daily. 60 tablet 5   Pediatric Multivit-Minerals (FLINTSTONES COMPLETE) CHEW Chew 2 tablets by mouth daily.     spironolactone  (ALDACTONE ) 50 MG tablet Take 1 tablet (50 mg total) by mouth 3 (three) times daily. 270 tablet 3   thyroid  (ARMOUR) 60 MG tablet Take 1 tablet (60 mg total) by mouth once daily. 90 tablet 4   Vitamin D , Ergocalciferol , (DRISDOL ) 1.25 MG (50000 UNIT) CAPS capsule Take 1 capsule (50,000 Units total) by mouth every 7 (seven) days. 12 capsule 1   No current facility-administered medications for this visit.      Psychiatric Specialty Exam: Review of Systems  Cardiovascular:  Negative for chest pain.  Psychiatric/Behavioral:  Negative for agitation and self-injury.     There were no vitals taken for this visit.There is no height or weight on file to calculate BMI.  General Appearance: Casual  Eye Contact:  Fair  Speech:  Slow  Volume:  Decreased  Mood:  somewhat stressed  Affect:  Constricted  Thought Process:  Goal Directed  Orientation:  Full (Time, Place, and Person)  Thought Content:  Rumination  Suicidal Thoughts:  No  Homicidal Thoughts:  No  Memory:  Immediate;   Fair Recent;   Fair  Judgement:  Fair  Insight:  Fair  Psychomotor Activity:  Decreased  Concentration:  Concentration: Fair and Attention Span: Fair  Recall:  [employer] of Knowledge:Fair  Language: Fair  Akathisia:  No  Handed:    AIMS (if indicated):  not done  Assets:  Desire for Improvement Financial Resources/Insurance  ADL's:  Intact  Cognition: WNL  Sleep:  Fair  Screenings: GAD-7    Flowsheet Row Office Visit from 11/13/2022 in [facility] Office Visit from 03/22/2015 in [facility] at [facility]  Total GAD-7 Score 0 0   PHQ2-9    Flowsheet Row Office Visit from 10/29/2023 in [facility] at [facility] Office Visit from 11/13/2022 in [facility] at [facility] Office Visit from 06/13/2022 in [facility] at [facility] Office Visit from 02/13/2022 in [facility] at [facility] Office Visit from 11/07/2021 in [facility] at [facility]  PHQ-2 Total Score 0 0 0 0 0  PHQ-9 Total Score 0 0 0 0 0   Flowsheet Row Video Visit from 05/21/2023 in [facility] Admission (Discharged) from 12/21/2022 in [facility]  Pre-Admission Testing 60 from 12/11/2022 in [facility]  C-SSRS RISK CATEGORY No Risk No Risk No Risk    Assessment and Plan:  As follows Prior documentation reviewed    GAD: Some job stress but managing it continue BuSpar  3 times a day  Should re consider therapy   Fibromyalgia.  Manageable she takes Provigil  for sleepiness during the day and Savella  for fibromyalgia also uses CPAP machine   Depression: manageable continue meds and add ME time  Plans and now goes to gym  Reviewed meds, questions addressed FU 2 - 3 m or earlier if needed   Jackey Flight, MD 8/4/20253:20 PM

## 2023-12-05 ENCOUNTER — Other Ambulatory Visit (HOSPITAL_COMMUNITY): Payer: Self-pay

## 2023-12-06 DIAGNOSIS — R350 Frequency of micturition: Secondary | ICD-10-CM | POA: Diagnosis not present

## 2023-12-06 DIAGNOSIS — M6281 Muscle weakness (generalized): Secondary | ICD-10-CM | POA: Diagnosis not present

## 2023-12-06 DIAGNOSIS — R150 Incomplete defecation: Secondary | ICD-10-CM | POA: Diagnosis not present

## 2023-12-06 DIAGNOSIS — M6289 Other specified disorders of muscle: Secondary | ICD-10-CM | POA: Diagnosis not present

## 2023-12-06 DIAGNOSIS — M62838 Other muscle spasm: Secondary | ICD-10-CM | POA: Diagnosis not present

## 2023-12-06 DIAGNOSIS — L910 Hypertrophic scar: Secondary | ICD-10-CM | POA: Diagnosis not present

## 2023-12-06 DIAGNOSIS — K5902 Outlet dysfunction constipation: Secondary | ICD-10-CM | POA: Diagnosis not present

## 2023-12-10 ENCOUNTER — Other Ambulatory Visit (HOSPITAL_BASED_OUTPATIENT_CLINIC_OR_DEPARTMENT_OTHER): Payer: Self-pay

## 2023-12-10 ENCOUNTER — Other Ambulatory Visit (INDEPENDENT_AMBULATORY_CARE_PROVIDER_SITE_OTHER)

## 2023-12-10 ENCOUNTER — Other Ambulatory Visit: Payer: Self-pay | Admitting: Internal Medicine

## 2023-12-10 ENCOUNTER — Other Ambulatory Visit: Payer: Self-pay

## 2023-12-10 ENCOUNTER — Other Ambulatory Visit (HOSPITAL_COMMUNITY): Payer: Self-pay

## 2023-12-10 DIAGNOSIS — E60 Dietary zinc deficiency: Secondary | ICD-10-CM | POA: Diagnosis not present

## 2023-12-10 DIAGNOSIS — E782 Mixed hyperlipidemia: Secondary | ICD-10-CM

## 2023-12-10 DIAGNOSIS — I2510 Atherosclerotic heart disease of native coronary artery without angina pectoris: Secondary | ICD-10-CM

## 2023-12-11 ENCOUNTER — Encounter: Payer: Self-pay | Admitting: Cardiology

## 2023-12-11 ENCOUNTER — Other Ambulatory Visit: Payer: Self-pay

## 2023-12-11 ENCOUNTER — Ambulatory Visit

## 2023-12-11 ENCOUNTER — Other Ambulatory Visit (HOSPITAL_COMMUNITY): Payer: Self-pay

## 2023-12-11 ENCOUNTER — Ambulatory Visit: Attending: Cardiology | Admitting: Cardiology

## 2023-12-11 VITALS — BP 144/94 | HR 97 | Ht 63.0 in | Wt 186.8 lb

## 2023-12-11 DIAGNOSIS — I10 Essential (primary) hypertension: Secondary | ICD-10-CM | POA: Diagnosis not present

## 2023-12-11 DIAGNOSIS — I2510 Atherosclerotic heart disease of native coronary artery without angina pectoris: Secondary | ICD-10-CM

## 2023-12-11 DIAGNOSIS — R001 Bradycardia, unspecified: Secondary | ICD-10-CM

## 2023-12-11 DIAGNOSIS — E782 Mixed hyperlipidemia: Secondary | ICD-10-CM | POA: Diagnosis not present

## 2023-12-11 DIAGNOSIS — R072 Precordial pain: Secondary | ICD-10-CM

## 2023-12-11 DIAGNOSIS — R002 Palpitations: Secondary | ICD-10-CM

## 2023-12-11 MED ORDER — REPATHA SURECLICK 140 MG/ML ~~LOC~~ SOAJ
140.0000 mg | SUBCUTANEOUS | 3 refills | Status: AC
  Filled 2023-12-11: qty 6, 84d supply, fill #0
  Filled 2024-02-29: qty 6, 84d supply, fill #1
  Filled 2024-05-23: qty 6, 84d supply, fill #2

## 2023-12-11 MED ORDER — AMLODIPINE BESYLATE 5 MG PO TABS
5.0000 mg | ORAL_TABLET | Freq: Every day | ORAL | 3 refills | Status: DC
  Filled 2023-12-11: qty 90, 90d supply, fill #0
  Filled 2024-03-01: qty 90, 90d supply, fill #1

## 2023-12-11 NOTE — Progress Notes (Unsigned)
 Enrolled for [employer] to mail a ZIO XT long term holter monitor to the patients address on file.

## 2023-12-11 NOTE — Progress Notes (Unsigned)
 Cardiology Office Note:    Date:  12/14/2023   ID:  [name], DOB [birth year], MRN [id number]  PCP:  Domenica Harlene LABOR, MD  Cardiologist:  Dub Huntsman, DO  Electrophysiologist:  None   Referring MD: Domenica Harlene LABOR, MD    I feel great  History of Present Illness:    [name] is a 63 y.o. female with a hx of hypertension, prediabetes, hyperlipidemia with statin intolerance mild coronary artery disease seen on coronary CTA  has tolerated PCSK9 inhibitors is here today for follow-up visit.   She experiences chest pain with a burning sensation and tightness in the chest, breasts, and back, particularly during physical activity. These episodes are accompanied by an increased heart rate, indigestion, and a sensation of hypoglycemia.  Coronary artery disease was identified with blockages on a CT scan in 2022.    Past Medical History:  Diagnosis Date   Acute bronchitis 05/25/2016   Anxiety    Arthritis    Chronic pain syndrome 01/06/2013   Chronic rhinosinusitis    Colon polyps    Cough 01/06/2013   Depression    Diabetes (HCC) 01/06/2013   Diabetes mellitus type 2 in obese 01/06/2013   Sees Dr Nancyann Ebbing for eye exam Does not see podiatry, foot exam today unremarkable except for thick cracking skin on heals  pt denies    Dyspnea    with exertion    Dysuria 05/25/2016   Edema 01/06/2013   Epistaxis 05/25/2016   Fibroid, uterine    Fibromyalgia    GERD (gastroesophageal reflux disease)    Glaucoma 03/2012   History of kidney stones    Hoarseness of voice 05/11/2013   Hyperglycemia 04/13/2013   Hyperlipemia, mixed 06/06/2007   Qualifier: Diagnosis of  By: Georgian ROSALEA CHARM Lamar She feels Lipitor caused increased low back pain and weakness     Hyperlipidemia    Hypertension    Hypokalemia 02/05/2013   Hypothyroidism    IBS (irritable bowel syndrome) 02/13/2011   Low back pain 03/03/2013   Muscle cramp 05/22/2014   Obesity    Obesity, unspecified 05/11/2013    OSA (obstructive sleep apnea)    cpap    Pain in joint, shoulder region 06/27/2015   Pre-diabetes    Raynaud disease 06/16/2013   Sleep apnea    Thyroid  disease    hypothyroidism   Vocal fold nodules     Past Surgical History:  Procedure Laterality Date   ABDOMINAL HYSTERECTOMY     ANAL RECTAL MANOMETRY N/A 03/22/2022   Procedure: ANO RECTAL MANOMETRY;  Surgeon: Saintclair Jasper, MD;  Location: [facility];  Service: Gastroenterology;  Laterality: N/A;   BREAST EXCISIONAL BIOPSY Right    at age 48   BREAST SURGERY  lump remove breast   age 66 yrs   CHOLECYSTECTOMY     colonoscopoy      CYSTECTOMY     DILATION AND CURETTAGE OF UTERUS     x2   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   Procedure: LAPAROSCOPIC ROUX-EN-Y GASTRIC BYPASS WITH UPPER ENDOSCOPY;  Surgeon: Tanda Locus, MD;  Location: [facility];  Service: General;  Laterality: N/A;   HERNIA REPAIR  5/22   I & D EXTREMITY Left 04/11/2018   Procedure: DEBIDMENT DISTAL INTERPHALANGEAL LEFT MIDDLE;  Surgeon: Murrell Kuba, MD;  Location: [facility];  Service: Orthopedics;  Laterality: Left;   INCISIONAL HERNIA REPAIR  08/10/2020   Procedure: LAPAROSCOPIC PRIMARY REPAIR OF INCISIONAL HERNIA;  Surgeon: Tanda Locus, MD;  Location: [facility];  Service: General;;   MASS EXCISION Left 04/11/2018   Procedure: EXCISION MASS;  Surgeon: Murrell Kuba, MD;  Location: [facility];  Service: Orthopedics;  Laterality: Left;   NEPHROLITHOTOMY  10/14/2021   OOPHORECTOMY     POLYPECTOMY     ROTATOR CUFF REPAIR     SHOULDER ARTHROSCOPY WITH ROTATOR CUFF REPAIR Right 12/21/2022   Procedure: Right shoulder arthroscopy, debridement, subacromial decompression, distal clavicle resection, rotator cuff repair;  Surgeon: Melita Drivers, MD;  Location: [facility];  Service: Orthopedics;  Laterality: Right;   UPPER GI ENDOSCOPY N/A 08/10/2020   Procedure: UPPER GI ENDOSCOPY;  Surgeon: Tanda Locus, MD;  Location: [facility];  Service: General;   Laterality: N/A;    Current Medications: Current Meds  Medication Sig   acarbose  (PRECOSE ) 50 MG tablet Take 1 tablet (50 mg total) by mouth 3 (three) times daily with meals.   acetaminophen  (TYLENOL ) 325 MG tablet Take 650 mg by mouth every 6 (six) hours as needed for moderate pain.   albuterol  (VENTOLIN  HFA) 108 (90 Base) MCG/ACT inhaler Inhale 2 puffs into the lungs every 4 (four) hours as needed for wheezing or shortness of breath.   amLODipine  (NORVASC ) 5 MG tablet Take 1 tablet (5 mg total) by mouth daily.   bisacodyl  (DULCOLAX) 5 MG EC tablet Take 20 mg by mouth daily as needed for moderate constipation.   busPIRone  (BUSPAR ) 10 MG tablet Take 1 tablet (10 mg total) by mouth 3 (three) times daily.   furosemide  (LASIX ) 20 MG tablet Take 1 tablet (20 mg total) by mouth daily as needed.   gabapentin  (NEURONTIN ) 100 MG capsule Take 1-3 capsules (100-300 mg total) by mouth at bedtime.   ipratropium (ATROVENT ) 0.06 % nasal spray Place 2 sprays into both nostrils 3 (three) times daily as needed for rhinitis   latanoprost  (XALATAN ) 0.005 % ophthalmic solution Place 1 drop into both eyes every other day at night   loratadine  (CLARITIN ) 10 MG tablet Take 0.5 tablets (5 mg total) by mouth daily as needed for allergies. Thurs, Sat, and Sun   Menthol, Topical Analgesic, (BIOFREEZE EX) Apply 1 Application topically 3 (three) times a week. Thurs, Sat, and Sun. Rub on shoulders   Menthol, Topical Analgesic, (ICY HOT EX) Apply 1 Application topically 3 (three) times a week. Thurs, Sat, Sun. Rub on lower back   metoprolol  tartrate (LOPRESSOR ) 25 MG tablet Take 1 tablet (25 mg total) by mouth 2 (two) times daily.   Milnacipran  (SAVELLA ) 50 MG TABS tablet Take 1 tablet (50 mg total) by mouth 2 (two) times daily.   modafinil  (PROVIGIL ) 200 MG tablet Take 2 tablets (400 mg total) by mouth daily.   Pediatric Multivit-Minerals (FLINTSTONES COMPLETE) CHEW Chew 2 tablets by mouth daily.   spironolactone   (ALDACTONE ) 50 MG tablet Take 1 tablet (50 mg total) by mouth 3 (three) times daily.   thyroid  (ARMOUR) 60 MG tablet Take 1 tablet (60 mg total) by mouth once daily.   Vitamin D , Ergocalciferol , (DRISDOL ) 1.25 MG (50000 UNIT) CAPS capsule Take 1 capsule (50,000 Units total) by mouth every 7 (seven) days.   [DISCONTINUED] Evolocumab  (REPATHA  SURECLICK) 140 MG/ML SOAJ Inject 140 mg into the skin every 14 (fourteen) days.     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  Tobacco Use   Smoking status: Former    Current packs/day: 0.00    Average packs/day: 0.5 packs/day for 30.0 years (15.0 ttl pk-yrs)    Types: Cigarettes    Start date: 04/24/1973    Quit date: 04/25/2003    Years since quitting: 20.6   Smokeless tobacco: Never   Tobacco comments:    smoked since age 67  Vaping Use   Vaping status: Never Used  Substance and Sexual Activity   Alcohol use: No   Drug use: Never   Sexual activity: Not Currently    Partners: Male    Birth control/protection: None  Other Topics Concern   Not on file  Social History Narrative   Endo-- Dr Von   ENT--Dr shoemaker   GI--Dr Buccini   Pulm--Dr Clance   Rheum--Dr Everlean         Social Drivers of Health   Financial Resource Strain: Low Risk  (10/22/2023)   Overall Financial Resource Strain (CARDIA)    Difficulty of Paying Living Expenses: Not hard at all  Food Insecurity: No Food Insecurity (10/22/2023)   Hunger Vital Sign    Worried About Running Out of Food in the Last Year: Never true    Ran Out of Food in the Last Year: Never true  Transportation Needs:  No Transportation Needs (10/22/2023)   PRAPARE - Administrator, Civil Service (Medical): No    Lack of Transportation (Non-Medical): No  Physical Activity: Sufficiently Active (10/22/2023)   Exercise Vital Sign    Days of Exercise per Week: 3 days    Minutes of Exercise per Session: 70 min  Stress: No Stress Concern Present (10/22/2023)   [employer] of Occupational Health - Occupational Stress Questionnaire    Feeling of Stress: Only a little  Social Connections: Moderately Isolated (10/22/2023)   Social Connection and Isolation Panel    Frequency of Communication with Friends and Family: Once a week    Frequency of Social Gatherings with Friends and Family: More than three times a week    Attends Religious Services: Never    Database administrator or Organizations: No    Attends Engineer, structural: Not on file    Marital Status: Living with partner     Family History: The patient's family history includes Alcohol abuse in her father; Anxiety disorder in her mother; Arthritis in her maternal grandmother; Breast cancer (age of onset: 46) in an other family member; Cancer in her father and paternal grandfather; Depression in her mother; Diabetes in her brother, sister, and another family member; Heart disease in her father and paternal grandmother; High Cholesterol in her mother; High blood pressure in her mother; Hyperlipidemia in her father; Hypertension in her father and mother; Kidney disease in her mother; Obesity in her mother; Stroke in her father.  ROS:   Review of Systems  Constitution: Negative for decreased appetite, fever and weight gain.  HENT: Negative for congestion, ear discharge, hoarse voice and sore throat.   Eyes: Negative for discharge, redness, vision loss in right eye and visual halos.  Cardiovascular: Negative for chest pain, dyspnea on exertion, leg swelling, orthopnea and palpitations.  Respiratory: Negative for cough, hemoptysis,  shortness of breath and snoring.   Endocrine: Negative for heat intolerance and polyphagia.  Hematologic/Lymphatic: Negative for bleeding problem. Does  not bruise/bleed easily.  Skin: Negative for flushing, nail changes, rash and suspicious lesions.  Musculoskeletal: Negative for arthritis, joint pain, muscle cramps, myalgias, neck pain and stiffness.  Gastrointestinal: Negative for abdominal pain, bowel incontinence, diarrhea and excessive appetite.  Genitourinary: Negative for decreased libido, genital sores and incomplete emptying.  Neurological: Negative for brief paralysis, focal weakness, headaches and loss of balance.  Psychiatric/Behavioral: Negative for altered mental status, depression and suicidal ideas.  Allergic/Immunologic: Negative for HIV exposure and persistent infections.    EKGs/Labs/Other Studies Reviewed:    The following studies were reviewed today:   EKG: Sinus rhythm, 81 bpm with marked sinus arrhythmia  Recent Labs: 10/29/2023: ALT 39; BUN 11; Creatinine, Ser 0.79; Hemoglobin 13.7; Magnesium  2.2; Platelets 319.0; Potassium 4.5; Sodium 140; TSH 1.01  Recent Lipid Panel    Component Value Date/Time   CHOL 123 11/12/2023 0811   TRIG 84 11/12/2023 0811   HDL 72 11/12/2023 0811   CHOLHDL 1.7 11/12/2023 0811   CHOLHDL 2 10/29/2023 1623   VLDL 19.6 10/29/2023 1623   LDLCALC 35 11/12/2023 0811   LDLCALC 138 (H) 02/13/2020 0857   LDLDIRECT 159.0 11/11/2019 1125    Physical Exam:    VS:  BP (!) 144/94   Pulse 97   Ht 5' 3 (1.6 m)   Wt 186 lb 12.8 oz (84.7 kg)   SpO2 98%   BMI 33.09 kg/m     Wt Readings from Last 3 Encounters:  12/11/23 186 lb 12.8 oz (84.7 kg)  11/14/23 190 lb (86.2 kg)  10/29/23 188 lb 6.4 oz (85.5 kg)     GEN: Well nourished, well developed in no acute distress HEENT: Normal NECK: No JVD; No carotid bruits LYMPHATICS: No lymphadenopathy CARDIAC: S1S2 noted,RRR, no murmurs, rubs, gallops RESPIRATORY:  Clear to auscultation  without rales, wheezing or rhonchi  ABDOMEN: Soft, non-tender, non-distended, +bowel sounds, no guarding. EXTREMITIES: No edema, No cyanosis, no clubbing MUSCULOSKELETAL:  No deformity  SKIN: Warm and dry NEUROLOGIC:  Alert and oriented x 3, non-focal PSYCHIATRIC:  Normal affect, good insight  ASSESSMENT:    1. Palpitations   2. Precordial pain   3. Coronary artery disease involving native heart, unspecified vessel or lesion type, unspecified whether angina present   4. Bradycardia   5. Primary hypertension   6. Hyperlipemia, mixed       PLAN:    Coronary artery disease with chest pain last CCTA was 2022 showed - Intermittent exertional chest pain and tightness with burning sensation. Normal echocardiogram. History of coronary artery blockages. Differential includes microvascular chest pain and possible Raynaud's phenomenon. - Prescribed low-dose amlodipine  for chest pain and Raynaud's phenomenon. - Ordered cardiac PET scan for microvascular chest pain assessment. - Scheduled PET scan before cardiac monitor use.  Bradycardia - Episodes of bradycardia with heart rate in the fifties. Bradycardia noted during surgery last year. Symptoms include hypoglycemic-like sensation during low heart rate. - Provided cardiac monitor for 14 days post-PET scan.  Blood pressure is acceptable, continue with current antihypertensive regimen. Hyperlipidemia - continue with current statin medication.  The patient is in agreement with the above plan. The patient left the office in stable condition.  The patient will follow up in 1 year   Medication Adjustments/Labs and Tests Ordered: Current medicines are reviewed at length with the patient today.  Concerns regarding medicines are outlined above.  Orders Placed This Encounter  Procedures   NM PET CT CARDIAC PERFUSION MULTI W/ABSOLUTE BLOODFLOW   Cardiac Stress Test: Informed Consent Details:  Physician/Practitioner Attestation; Transcribe to consent  form and obtain patient signature   LONG TERM MONITOR (3-14 DAYS)    Meds ordered this encounter  Medications   amLODipine  (NORVASC ) 5 MG tablet    Sig: Take 1 tablet (5 mg total) by mouth daily.    Dispense:  90 tablet    Refill:  3     Patient Instructions  Medication Instructions:  Your physician has recommended you make the following change in your medication:  START: Amlodipine  5 mg once daily *If you need a refill on your cardiac medications before your next appointment, please call your pharmacy*  Testing/Procedures: ZIO XT- Long Term Monitor Instructions  Your physician has requested you wear a ZIO patch monitor for 14 days.  This is a single patch monitor. [employer] supplies one patch monitor per enrollment. Additional stickers are not available. Please do not apply patch if you will be having a Nuclear Stress Test,  Echocardiogram, Cardiac CT, MRI, or Chest Xray during the period you would be wearing the  monitor. The patch cannot be worn during these tests. You cannot remove and re-apply the  ZIO XT patch monitor.  Your ZIO patch monitor will be mailed 3 day USPS to your address on file. It may take 3-5 days  to receive your monitor after you have been enrolled.  Once you have received your monitor, please review the enclosed instructions. Your monitor  has already been registered assigning a specific monitor serial # to you.  Billing and Patient Assistance Program Information  We have supplied [employer] with any of your insurance information on file for billing purposes. [employer] offers a sliding scale Patient Assistance Program for patients that do not have  insurance, or whose insurance does not completely cover the cost of the ZIO monitor.  You must apply for the Patient Assistance Program to qualify for this discounted rate.  To apply, please call [employer] at [phone], select option 4, select option 2, ask to apply for  Patient Assistance Program. Meredeth will  ask your household income, and how many people  are in your household. They will quote your out-of-pocket cost based on that information.  [employer] will also be able to set up a 60-month, interest-free payment plan if needed.  Applying the monitor   Shave hair from upper left chest.  Hold abrader disc by orange tab. Rub abrader in 40 strokes over the upper left chest as  indicated in your monitor instructions.  Clean area with 4 enclosed alcohol pads. Let dry.  Apply patch as indicated in monitor instructions. Patch will be placed under collarbone on left  side of chest with arrow pointing upward.  Rub patch adhesive wings for 2 minutes. Remove white label marked 1. Remove the white  label marked 2. Rub patch adhesive wings for 2 additional minutes.  While looking in a mirror, press and release button in center of patch. A small green light will  flash 3-4 times. This will be your only indicator that the monitor has been turned on.  Do not shower for the first 24 hours. You may shower after the first 24 hours.  Press the button if you feel a symptom. You will hear a small click. Record Date, Time and  Symptom in the Patient Logbook.  When you are ready to remove the patch, follow instructions on the last 2 pages of Patient  Logbook. Stick patch monitor onto the last page of Patient Logbook.  Place Patient Logbook in the blue  and white box. Use locking tab on box and tape box closed  securely. The blue and white box has prepaid postage on it. Please place it in the mailbox as  soon as possible. Your physician should have your test results approximately 7 days after the  monitor has been mailed back to [facility].  Call [facility] Customer Care at [phone] if you have questions regarding  your ZIO XT patch monitor. Call them immediately if you see an orange light blinking on your  monitor.  If your monitor falls off in less than 4 days, contact our Monitor department at  [phone].  If your monitor becomes loose or falls off after 4 days call [employer] at [phone] for  suggestions on securing your monitor    The schedulers will call you to get this scheduled. Please follow further testing instructions below.    Please report to Radiology at the [facility] Main Entrance 30 minutes early for your test.  [address] [place], [place] [address]                         OR   Please report to Radiology at [facility] Main Entrance, medical mall, 30 mins prior to your test.  [address]  [place], [place]  How to Prepare for Your Cardiac PET/CT Stress Test:  Nothing to eat or drink, except water, 3 hours prior to arrival time.  NO caffeine/decaffeinated products, or chocolate 12 hours prior to arrival. (Please note decaffeinated beverages (teas/coffees) still contain caffeine).  If you have caffeine within 12 hours prior, the test will need to be rescheduled.  Medication instructions: Do not take erectile dysfunction medications for 72 hours prior to test (sildenafil, tadalafil ) Do not take nitrates (isosorbide mononitrate, Ranexa ) the day before or day of test Do not take tamsulosin  the day before or morning of test Hold theophylline containing medications for 12 hours. Hold Dipyridamole 48 hours prior to the test.  Diabetic Preparation: If able to eat breakfast prior to 3 hour fasting, you may take all medications, including your insulin . Do not worry if you miss your breakfast dose of insulin  - start at your next meal. If you do not eat prior to 3 hour fast-Hold all diabetes (oral and insulin ) medications. Patients who wear a continuous glucose monitor MUST remove the device prior to scanning.  You may take your remaining medications with water.  NO perfume, cologne or lotion on chest or abdomen area. FEMALES - Please avoid wearing dresses to this appointment.  Total time is 1 to 2 hours; you may  want to bring reading material for the waiting time.  IF YOU THINK YOU MAY BE PREGNANT, OR ARE NURSING PLEASE INFORM THE TECHNOLOGIST.  In preparation for your appointment, medication and supplies will be purchased.  Appointment availability is limited, so if you need to cancel or reschedule, please call the Radiology Department Scheduler at [phone] 24 hours in advance to avoid a cancellation fee of $100.00  What to Expect When you Arrive:  Once you arrive and check in for your appointment, you will be taken to a preparation room within the Radiology Department.  A technologist or Nurse will obtain your medical history, verify that you are correctly prepped for the exam, and explain the procedure.  Afterwards, an IV will be started in your arm and electrodes will be placed on your skin for EKG monitoring during the stress portion of  the exam. Then you will be escorted to the PET/CT scanner.  There, staff will get you positioned on the scanner and obtain a blood pressure and EKG.  During the exam, you will continue to be connected to the EKG and blood pressure machines.  A small, safe amount of a radioactive tracer will be injected in your IV to obtain a series of pictures of your heart along with an injection of a stress agent.    After your Exam:  It is recommended that you eat a meal and drink a caffeinated beverage to counter act any effects of the stress agent.  Drink plenty of fluids for the remainder of the day and urinate frequently for the first couple of hours after the exam.  Your doctor will inform you of your test results within 7-10 business days.  For more information and frequently asked questions, please visit our website: [email or web address]  For questions about your test or how to prepare for your test, please call: Cardiac Imaging Nurse Navigators Office: [phone]   Follow-Up: At [facility], you and your health needs are our priority.  As  part of our continuing mission to provide you with exceptional heart care, our providers are all part of one team.  This team includes your primary Cardiologist (physician) and Advanced Practice Providers or APPs (Physician Assistants and Nurse Practitioners) who all work together to provide you with the care you need, when you need it.  Your next appointment:   16 week(s)  Provider:   Anaily Ashbaugh, DO      Adopting a Healthy Lifestyle.  Know what a healthy weight is for you (roughly BMI <25) and aim to maintain this   Aim for 7+ servings of fruits and vegetables daily   65-80+ fluid ounces of water or unsweet tea for healthy kidneys   Limit to max 1 drink of alcohol per day; avoid smoking/tobacco   Limit animal fats in diet for cholesterol and heart health - choose grass fed whenever available   Avoid highly processed foods, and foods high in saturated/trans fats   Aim for low stress - take time to unwind and care for your mental health   Aim for 150 min of moderate intensity exercise weekly for heart health, and weights twice weekly for bone health   Aim for 7-9 hours of sleep daily   When it comes to diets, agreement about the perfect plan isnt easy to find, even among the experts. Experts at the [facility] of [employer] developed an idea known as the Healthy Eating Plate. Just imagine a plate divided into logical, healthy portions.   The emphasis is on diet quality:   Load up on vegetables and fruits - one-half of your plate: Aim for color and variety, and remember that potatoes dont count.   Go for whole grains - one-quarter of your plate: Whole wheat, barley, wheat berries, quinoa, oats, brown rice, and foods made with them. If you want pasta, go with whole wheat pasta.   Protein power - one-quarter of your plate: Fish, chicken, beans, and nuts are all healthy, versatile protein sources. Limit red meat.   The diet, however, does go beyond the plate, offering a  few other suggestions.   Use healthy plant oils, such as olive, canola, soy, corn, sunflower and peanut. Check the labels, and avoid partially hydrogenated oil, which have unhealthy trans fats.   If youre thirsty, drink water. Coffee and tea are good in moderation, but skip  sugary drinks and limit milk and dairy products to one or two daily servings.   The type of carbohydrate in the diet is more important than the amount. Some sources of carbohydrates, such as vegetables, fruits, whole grains, and beans-are healthier than others.   Finally, stay active  Bonney Dub Huntsman, DO  12/14/2023 11:03 PM    [facility]

## 2023-12-11 NOTE — Patient Instructions (Addendum)
 Medication Instructions:  Your physician has recommended you make the following change in your medication:  START: Amlodipine  5 mg once daily *If you need a refill on your cardiac medications before your next appointment, please call your pharmacy*  Testing/Procedures: ZIO XT- Long Term Monitor Instructions  Your physician has requested you wear a ZIO patch monitor for 14 days.  This is a single patch monitor. [employer] supplies one patch monitor per enrollment. Additional stickers are not available. Please do not apply patch if you will be having a Nuclear Stress Test,  Echocardiogram, Cardiac CT, MRI, or Chest Xray during the period you would be wearing the  monitor. The patch cannot be worn during these tests. You cannot remove and re-apply the  ZIO XT patch monitor.  Your ZIO patch monitor will be mailed 3 day USPS to your address on file. It may take 3-5 days  to receive your monitor after you have been enrolled.  Once you have received your monitor, please review the enclosed instructions. Your monitor  has already been registered assigning a specific monitor serial # to you.  Billing and Patient Assistance Program Information  We have supplied [employer] with any of your insurance information on file for billing purposes. [employer] offers a sliding scale Patient Assistance Program for patients that do not have  insurance, or whose insurance does not completely cover the cost of the ZIO monitor.  You must apply for the Patient Assistance Program to qualify for this discounted rate.  To apply, please call [employer] at [phone], select option 4, select option 2, ask to apply for  Patient Assistance Program. Meredeth will ask your household income, and how many people  are in your household. They will quote your out-of-pocket cost based on that information.  [employer] will also be able to set up a 15-month, interest-free payment plan if needed.  Applying the monitor   Shave hair from upper  left chest.  Hold abrader disc by orange tab. Rub abrader in 40 strokes over the upper left chest as  indicated in your monitor instructions.  Clean area with 4 enclosed alcohol pads. Let dry.  Apply patch as indicated in monitor instructions. Patch will be placed under collarbone on left  side of chest with arrow pointing upward.  Rub patch adhesive wings for 2 minutes. Remove white label marked 1. Remove the white  label marked 2. Rub patch adhesive wings for 2 additional minutes.  While looking in a mirror, press and release button in center of patch. A small green light will  flash 3-4 times. This will be your only indicator that the monitor has been turned on.  Do not shower for the first 24 hours. You may shower after the first 24 hours.  Press the button if you feel a symptom. You will hear a small click. Record Date, Time and  Symptom in the Patient Logbook.  When you are ready to remove the patch, follow instructions on the last 2 pages of Patient  Logbook. Stick patch monitor onto the last page of Patient Logbook.  Place Patient Logbook in the blue and white box. Use locking tab on box and tape box closed  securely. The blue and white box has prepaid postage on it. Please place it in the mailbox as  soon as possible. Your physician should have your test results approximately 7 days after the  monitor has been mailed back to [facility].  Call [facility] Customer Care at [phone] if you have questions  regarding  your ZIO XT patch monitor. Call them immediately if you see an orange light blinking on your  monitor.  If your monitor falls off in less than 4 days, contact our Monitor department at [phone].  If your monitor becomes loose or falls off after 4 days call [employer] at [phone] for  suggestions on securing your monitor    The schedulers will call you to get this scheduled. Please follow further testing instructions below.    Please report to  Radiology at the [facility] Main Entrance 30 minutes early for your test.  [address] [place], [place] [address]                         OR   Please report to Radiology at [facility] Main Entrance, medical mall, 30 mins prior to your test.  [address]  [place], [place]  How to Prepare for Your Cardiac PET/CT Stress Test:  Nothing to eat or drink, except water, 3 hours prior to arrival time.  NO caffeine/decaffeinated products, or chocolate 12 hours prior to arrival. (Please note decaffeinated beverages (teas/coffees) still contain caffeine).  If you have caffeine within 12 hours prior, the test will need to be rescheduled.  Medication instructions: Do not take erectile dysfunction medications for 72 hours prior to test (sildenafil, tadalafil ) Do not take nitrates (isosorbide mononitrate, Ranexa ) the day before or day of test Do not take tamsulosin  the day before or morning of test Hold theophylline containing medications for 12 hours. Hold Dipyridamole 48 hours prior to the test.  Diabetic Preparation: If able to eat breakfast prior to 3 hour fasting, you may take all medications, including your insulin . Do not worry if you miss your breakfast dose of insulin  - start at your next meal. If you do not eat prior to 3 hour fast-Hold all diabetes (oral and insulin ) medications. Patients who wear a continuous glucose monitor MUST remove the device prior to scanning.  You may take your remaining medications with water.  NO perfume, cologne or lotion on chest or abdomen area. FEMALES - Please avoid wearing dresses to this appointment.  Total time is 1 to 2 hours; you may want to bring reading material for the waiting time.  IF YOU THINK YOU MAY BE PREGNANT, OR ARE NURSING PLEASE INFORM THE TECHNOLOGIST.  In preparation for your appointment, medication and supplies will be purchased.  Appointment availability is limited, so if you need to  cancel or reschedule, please call the Radiology Department Scheduler at [phone] 24 hours in advance to avoid a cancellation fee of $100.00  What to Expect When you Arrive:  Once you arrive and check in for your appointment, you will be taken to a preparation room within the Radiology Department.  A technologist or Nurse will obtain your medical history, verify that you are correctly prepped for the exam, and explain the procedure.  Afterwards, an IV will be started in your arm and electrodes will be placed on your skin for EKG monitoring during the stress portion of the exam. Then you will be escorted to the PET/CT scanner.  There, staff will get you positioned on the scanner and obtain a blood pressure and EKG.  During the exam, you will continue to be connected to the EKG and blood pressure machines.  A small, safe amount of a radioactive tracer will be injected in your IV to obtain a series of pictures of  your heart along with an injection of a stress agent.    After your Exam:  It is recommended that you eat a meal and drink a caffeinated beverage to counter act any effects of the stress agent.  Drink plenty of fluids for the remainder of the day and urinate frequently for the first couple of hours after the exam.  Your doctor will inform you of your test results within 7-10 business days.  For more information and frequently asked questions, please visit our website: [email or web address]  For questions about your test or how to prepare for your test, please call: Cardiac Imaging Nurse Navigators Office: [phone]   Follow-Up: At [facility], you and your health needs are our priority.  As part of our continuing mission to provide you with exceptional heart care, our providers are all part of one team.  This team includes your primary Cardiologist (physician) and Advanced Practice Providers or APPs (Physician Assistants and Nurse Practitioners) who all work  together to provide you with the care you need, when you need it.  Your next appointment:   16 week(s)  Provider:   Kardie Tobb, DO

## 2023-12-12 DIAGNOSIS — N200 Calculus of kidney: Secondary | ICD-10-CM | POA: Diagnosis not present

## 2023-12-13 ENCOUNTER — Encounter: Payer: Self-pay | Admitting: *Deleted

## 2023-12-13 LAB — ZINC: Zinc: 90 ug/dL (ref 60–130)

## 2023-12-14 ENCOUNTER — Ambulatory Visit: Payer: Self-pay | Admitting: Family Medicine

## 2023-12-17 ENCOUNTER — Other Ambulatory Visit: Payer: Self-pay

## 2023-12-17 ENCOUNTER — Other Ambulatory Visit: Payer: Self-pay | Admitting: Family Medicine

## 2023-12-17 ENCOUNTER — Other Ambulatory Visit (HOSPITAL_COMMUNITY): Payer: Self-pay

## 2023-12-18 ENCOUNTER — Other Ambulatory Visit (HOSPITAL_COMMUNITY): Payer: Self-pay

## 2023-12-18 ENCOUNTER — Other Ambulatory Visit: Payer: Self-pay

## 2023-12-18 MED ORDER — SPIRONOLACTONE 50 MG PO TABS
50.0000 mg | ORAL_TABLET | Freq: Three times a day (TID) | ORAL | 3 refills | Status: AC
  Filled 2023-12-18: qty 270, 90d supply, fill #0
  Filled 2024-03-18: qty 270, 90d supply, fill #1

## 2023-12-25 DIAGNOSIS — K5902 Outlet dysfunction constipation: Secondary | ICD-10-CM | POA: Diagnosis not present

## 2023-12-25 DIAGNOSIS — M6289 Other specified disorders of muscle: Secondary | ICD-10-CM | POA: Diagnosis not present

## 2023-12-25 DIAGNOSIS — M62838 Other muscle spasm: Secondary | ICD-10-CM | POA: Diagnosis not present

## 2023-12-25 DIAGNOSIS — L910 Hypertrophic scar: Secondary | ICD-10-CM | POA: Diagnosis not present

## 2023-12-25 DIAGNOSIS — M6281 Muscle weakness (generalized): Secondary | ICD-10-CM | POA: Diagnosis not present

## 2023-12-31 ENCOUNTER — Other Ambulatory Visit (HOSPITAL_COMMUNITY): Payer: Self-pay

## 2024-01-01 ENCOUNTER — Other Ambulatory Visit: Payer: Self-pay

## 2024-01-01 ENCOUNTER — Other Ambulatory Visit (HOSPITAL_COMMUNITY): Payer: Self-pay

## 2024-01-01 DIAGNOSIS — R002 Palpitations: Secondary | ICD-10-CM | POA: Diagnosis not present

## 2024-01-08 ENCOUNTER — Other Ambulatory Visit (HOSPITAL_COMMUNITY): Payer: Self-pay

## 2024-01-08 DIAGNOSIS — E039 Hypothyroidism, unspecified: Secondary | ICD-10-CM | POA: Diagnosis not present

## 2024-01-09 DIAGNOSIS — M180 Bilateral primary osteoarthritis of first carpometacarpal joints: Secondary | ICD-10-CM | POA: Diagnosis not present

## 2024-01-11 DIAGNOSIS — R002 Palpitations: Secondary | ICD-10-CM | POA: Diagnosis not present

## 2024-01-14 ENCOUNTER — Other Ambulatory Visit (HOSPITAL_COMMUNITY): Payer: Self-pay

## 2024-01-14 DIAGNOSIS — M62838 Other muscle spasm: Secondary | ICD-10-CM | POA: Diagnosis not present

## 2024-01-14 DIAGNOSIS — M6281 Muscle weakness (generalized): Secondary | ICD-10-CM | POA: Diagnosis not present

## 2024-01-14 DIAGNOSIS — K5902 Outlet dysfunction constipation: Secondary | ICD-10-CM | POA: Diagnosis not present

## 2024-01-14 DIAGNOSIS — L910 Hypertrophic scar: Secondary | ICD-10-CM | POA: Diagnosis not present

## 2024-01-14 DIAGNOSIS — M6289 Other specified disorders of muscle: Secondary | ICD-10-CM | POA: Diagnosis not present

## 2024-01-25 ENCOUNTER — Telehealth (HOSPITAL_BASED_OUTPATIENT_CLINIC_OR_DEPARTMENT_OTHER): Payer: Self-pay | Admitting: *Deleted

## 2024-01-25 ENCOUNTER — Encounter (HOSPITAL_COMMUNITY): Payer: Self-pay

## 2024-01-25 NOTE — Progress Notes (Signed)
 Office Visit Note  Patient: [name]             Date of Birth: [birth year]           MRN: [id number]             PCP: Domenica Harlene LABOR, MD Referring: Domenica Harlene LABOR, MD Visit Date: 02/08/2024   Subjective:  Pain (States having lots of pain and burning in her nipples/areola . More in the left than the right leading to chest pain )    Discussed the use of AI scribe software for clinical note transcription with the patient, who gave verbal consent to proceed.  History of Present Illness   [name] is a 63 year old female with Raynaud's phenomenon who presents with worsening symptoms of nipple pain and hand discoloration, and was last seen 02/2022.  She has been experiencing intense burning and redness in the left nipple for about a year, exacerbated by temperature changes, particularly when transitioning from hot to cold environments. The pain is most severe at the nipple area and has not improved with the use of Vaseline and castor oil. She is hesitant to use Lanacane due to potential burning sensations. The pain affects her daily activities, causing nausea and discomfort, especially after exercising or at work.  Her history of Raynaud's phenomenon involves her hands turning blue or white and becoming painful, particularly in response to cold or stress. She describes her hands as 'ice cold' and aching. Recently, she received a shot in her thumb and had x-rays performed. Her feet are also affected, with some toenails detaching, and she applies lard to her feet to prevent dryness.  Her immediately relevant current medications include amlodipine  5 mg daily and modafinil , taken on Monday, Wednesday, Friday, Saturday, and Sunday. She previously tried nifedipine  and tadalafil  without significant improvement.  She has a positive ANA test and has previously undergone blood tests to check for inflammatory markers these were unremarkable so never started on any DMARD treatment..        Previous HPI 02/28/2022 [name] is a 63 y.o. female here for follow up for raynaud's symptoms on nifedipine  30 mg daily discontinue tadalafil  due to seeing no clinical improvement.  Symptoms were decreased over the summertime but she is having more symptoms again with the cooler weather.  She is noticing some increased erythema around the base of the fingernails.  She also saw hand surgery over the interval and was diagnosed with carpal tunnel syndrome.   Previous HPI 06/27/2021 [name] is a 63 y.o. female here for follow up for raynaud's symptoms on nifedipine  30 mg daily and starting tadalafil  5 mg daily after last visit. She has not seen a significant improvement in symptoms since starting the medication. She also does not report any side effects. She notices some increased joint pain and cysts overlying her finger joints on both hands. She reports intermittent episodes of gross blood in urine. Workup for this without clear cause established so far. She had recent labs checked showing mild CK elevation. She had CK elevation previously including in 2016 rheum evaluation with normal inflammatory markers and negative ANA.   Previous HPI 03/30/21 [name] is a 63 y.o. female here for follow up with raynaud's symptoms and abnormal labs initial visit demonstrating increased serum IgM of 433 ALT of 43 and ESR 38. We also recommended switching back to a single calcium  channel blocker due to peripheral edema increased on combination  of amlodipine  and nifedipine .  Now she is just taking the Procardia  30 mg daily and had resolution of the lower extremity edema.  Hand discoloration pain and tightness continues intermittently as before.   Previous HPI 03/16/21 [name] is a 63 y.o. female here for evaluation of raynaud's symptoms and abnormal labs. She has had cold hands and feet longstanding but the development of discoloration in her fingers is newer in particular since her  bariatric surgery earlier this year. She experiences triphasic color changes with cold exposure most often although sometimes without clear provocation. She denies any digital pitting, peeling, or ulceration change. She takes precautions for cold wearing gloves and wearing clothes sleeping at night to She was previously on amlodipine  5 mg PO daily as well as metoprolol  for hypertension. This was recommended switching to nifedipine  30 mg PO daily after vascular surgery evaluation with findings consistent for raynaud's. She restarted the amlodipine  again in combination with this since yesterday and reports noticing ankle swelling this morning. She takes modafinil  400 mg about 4 days per week for help with work concentration unchanged for about 10 years.    Labs reviewed 02/2021 ANCA ATYP P-ANCA 1:160     Review of Systems  Constitutional:  Positive for fatigue.  HENT:  Negative for mouth sores and mouth dryness.   Eyes:  Negative for dryness.  Respiratory:  Negative for shortness of breath.   Cardiovascular:  Positive for chest pain and palpitations.  Gastrointestinal:  Positive for constipation. Negative for blood in stool and diarrhea.  Endocrine: Negative for increased urination.  Genitourinary:  Negative for involuntary urination.  Musculoskeletal:  Positive for joint pain, joint pain, joint swelling, myalgias, muscle weakness, morning stiffness and myalgias. Negative for gait problem and muscle tenderness.  Skin:  Positive for hair loss. Negative for color change, rash and sensitivity to sunlight.  Allergic/Immunologic: Negative for susceptible to infections.  Neurological:  Positive for dizziness and headaches.  Hematological:  Negative for swollen glands.  Psychiatric/Behavioral:  Positive for depressed mood. Negative for sleep disturbance. The patient is not nervous/anxious.     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   Hypoglycemia 06/18/2022   Pernio 02/28/2022   Elevated liver function tests 02/12/2022   Tinea corporis 11/07/2021   Kidney stone 11/07/2021   Hematuria 08/05/2021   Pelvic pain 08/05/2021   Vitamin B12 deficiency 05/09/2021   Diverticula of intestine 03/16/2021   Sciatica 03/16/2021   Abnormal ANCA test 03/16/2021   High risk medication use 03/16/2021   Familial hypercholesterolemia 12/31/2020   Fibromyalgia 12/20/2020   Raynaud's phenomenon 12/07/2020   Gastric bypass status for obesity 08/11/2020   Chronic idiopathic constipation 07/15/2020   Mild CAD 05/13/2020   Anal pain 04/12/2020   Dysphonia 04/12/2020   Epigastric pain 04/12/2020   Family history of malignant neoplasm of gastrointestinal tract 04/12/2020   Left lower quadrant pain 04/12/2020   Thoracic and lumbosacral neuritis 04/12/2020   Vocal fold nodules    Thyroid  disease    Hypertension    GERD (gastroesophageal reflux disease)    Fibroid, uterine    Depressive disorder    Chronic rhinosinusitis    Anxiety    Hemorrhoids 02/22/2020   Statin intolerance 12/28/2019   Elevated CK 11/12/2019   Lymphadenopathy 11/12/2019   Neck pain 11/04/2019   Cervical radiculopathy 05/19/2019   Numbness  and tingling 04/17/2019   Tachycardia 03/03/2019   Umbilical hernia 01/19/2019   Sun-damaged skin 01/19/2019   Preventative health care 01/19/2019   Right shoulder pain 01/16/2019   Carpal tunnel syndrome of right wrist 09/04/2018   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/26/2018   Bilateral hand pain 01/30/2018   Mucoid cyst, joint 01/30/2018   Nodule of finger of both hands 01/10/2018   Osteoarthritis of spine with radiculopathy, cervical region 07/24/2016   Dysuria 05/25/2016   Arthritis 05/25/2016   Obesity (BMI 30.0-34.9) 02/18/2016   Left shoulder pain 06/27/2015   Pain in joint, shoulder region 06/27/2015   Myalgia  03/27/2015   Muscle cramp 05/22/2014   Mass of soft tissue of neck 02/23/2014   AP (abdominal pain) 02/15/2014   Hoarseness of voice 05/11/2013   Backache 03/03/2013   Low back pain 03/03/2013   Hypokalemia 02/05/2013   Edema 01/06/2013   Type 2 diabetes mellitus with obesity 01/06/2013   Chronic pain syndrome 01/06/2013   Cough 01/06/2013   Perimenopause 10/05/2012   Glaucoma    Dyspnea 09/10/2011   Vitamin D  deficiency 02/13/2011   RHINITIS, CHRONIC 01/14/2010   Atypical chest pain 06/16/2008   THYROMEGALY 11/11/2007   Allergic rhinitis 11/11/2007   ECZEMA, HANDS 07/22/2007   Hypothyroidism 06/06/2007   Hyperlipemia, mixed 06/06/2007   ADJ DISORDER WITH MIXED ANXIETY & DEPRESSED MOOD 06/06/2007   OSA (obstructive sleep apnea) 06/06/2007   History of colonic polyps 06/06/2007    Past Medical History:  Diagnosis Date   Acute bronchitis 05/25/2016   Anxiety    Arthritis    Chronic pain syndrome 01/06/2013   Chronic rhinosinusitis    Colon polyps    Cough 01/06/2013   Depression    Diabetes (HCC) 01/06/2013   Diabetes mellitus type 2 in obese 01/06/2013   Sees Dr Nancyann Ebbing for eye exam Does not see podiatry, foot exam today unremarkable except for thick cracking skin on heals  pt denies    Dyspnea    with exertion    Dysuria 05/25/2016   Edema 01/06/2013   Epistaxis 05/25/2016   Fibroid, uterine    Fibromyalgia    GERD (gastroesophageal reflux disease)    Glaucoma 03/2012   History of kidney stones    Hoarseness of voice 05/11/2013   Hyperglycemia 04/13/2013   Hyperlipemia, mixed 06/06/2007   Qualifier: Diagnosis of  By: Georgian ROSALEA CHARM Lamar She feels Lipitor caused increased low back pain and weakness     Hyperlipidemia    Hypertension    Hypokalemia 02/05/2013   Hypothyroidism    IBS (irritable bowel syndrome) 02/13/2011   Low back pain 03/03/2013   Muscle cramp 05/22/2014   Obesity    Obesity, unspecified 05/11/2013   OSA (obstructive sleep apnea)     cpap    Pain in joint, shoulder region 06/27/2015   Pre-diabetes    Raynaud disease 06/16/2013   Sleep apnea    Thyroid  disease    hypothyroidism   Vocal fold nodules     Family History  Problem Relation Age of Onset   Alcohol abuse Father    Cancer Father        renal and colon   Hyperlipidemia Father    Hypertension Father    Stroke Father    Heart disease Father    Cancer Paternal Grandfather        colon   Diabetes Other    High blood pressure Mother    High Cholesterol Mother  Kidney disease Mother    Depression Mother    Anxiety disorder Mother    Obesity Mother    Hypertension Mother    Breast cancer Other 44   Diabetes Sister    Diabetes Brother    Arthritis Maternal Grandmother    Heart disease Paternal Grandmother    Past Surgical History:  Procedure Laterality Date   ABDOMINAL HYSTERECTOMY     ANAL RECTAL MANOMETRY N/A 03/22/2022   Procedure: ANO RECTAL MANOMETRY;  Surgeon: Saintclair Jasper, MD;  Location: [facility];  Service: Gastroenterology;  Laterality: N/A;   BREAST EXCISIONAL BIOPSY Right    at age 46   BREAST SURGERY  lump remove breast   age 28 yrs   CHOLECYSTECTOMY     colonoscopoy      CYSTECTOMY     DILATION AND CURETTAGE OF UTERUS     x2   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   Procedure: LAPAROSCOPIC ROUX-EN-Y GASTRIC BYPASS WITH UPPER ENDOSCOPY;  Surgeon: Tanda Locus, MD;  Location: [facility];  Service: General;  Laterality: N/A;   HERNIA REPAIR  5/22   I & D EXTREMITY Left 04/11/2018   Procedure: DEBIDMENT DISTAL INTERPHALANGEAL LEFT MIDDLE;  Surgeon: Murrell Kuba, MD;  Location: [facility];  Service: Orthopedics;  Laterality: Left;   INCISIONAL HERNIA REPAIR  08/10/2020   Procedure: LAPAROSCOPIC PRIMARY REPAIR OF INCISIONAL HERNIA;  Surgeon: Tanda Locus, MD;  Location: [facility];  Service: General;;   MASS EXCISION Left 04/11/2018   Procedure: EXCISION MASS;  Surgeon: Murrell Kuba, MD;  Location: [facility];   Service: Orthopedics;  Laterality: Left;   NEPHROLITHOTOMY  10/14/2021   OOPHORECTOMY     POLYPECTOMY     ROTATOR CUFF REPAIR     SHOULDER ARTHROSCOPY WITH ROTATOR CUFF REPAIR Right 12/21/2022   Procedure: Right shoulder arthroscopy, debridement, subacromial decompression, distal clavicle resection, rotator cuff repair;  Surgeon: Melita Drivers, MD;  Location: [facility];  Service: Orthopedics;  Laterality: Right;   UPPER GI ENDOSCOPY N/A 08/10/2020   Procedure: UPPER GI ENDOSCOPY;  Surgeon: Tanda Locus, MD;  Location: [facility];  Service: General;  Laterality: N/A;   Social History   Social History Narrative   Endo-- Dr Von   ENT--Dr shoemaker   GI--Dr Buccini   Pulm--Dr Clance   Rheum--Dr Everlean         Immunization History  Administered Date(s) Administered   INFLUENZA, HIGH DOSE SEASONAL PF 01/06/2013, 01/10/2016, 01/10/2018, 01/16/2019, 01/02/2020   Influenza Split 01/10/2021   Influenza Whole 01/23/2008, 01/28/2009, 02/16/2010   Influenza, Seasonal, Injecte, Preservative Fre 02/13/2023, 01/30/2024   Influenza,inj,Quad PF,6+ Mos 01/06/2013, 01/10/2016, 01/10/2018, 01/16/2019, 01/02/2020   Influenza-Unspecified 01/22/2014, 02/09/2015, 01/17/2017, 01/20/2022   PFIZER Comirnaty(Gray Top)Covid-19 Tri-Sucrose Vaccine 05/05/2019, 05/26/2019   PFIZER(Purple Top)SARS-COV-2 Vaccination 05/05/2019, 05/26/2019   Pneumococcal Conjugate-13 05/22/2014   Pneumococcal Polysaccharide-23 03/22/2015   Td 12/18/2000   Tdap 12/18/2000, 02/10/2014   Zoster Recombinant(Shingrix ) 01/10/2018, 05/21/2018     Objective: Vital Signs: BP 126/70   Pulse 83   Temp (!) 96.3 F (35.7 C)   Resp 16   Ht 5' 3 (1.6 m)   Wt 187 lb 4.8 oz (85 kg)   BMI 33.18 kg/m    Physical Exam Eyes:     Conjunctiva/sclera: Conjunctivae normal.  Cardiovascular:     Rate and Rhythm: Normal rate and regular rhythm.  Pulmonary:     Effort: Pulmonary effort is normal.     Breath sounds: Normal breath sounds.   Lymphadenopathy:  Cervical: No cervical adenopathy.  Skin:    General: Skin is warm and dry.     Comments: Slightly tortuous and enlarged nailfold capillaries seen No associated skin thickening, nail pitting, or telangiectasias  Neurological:     Mental Status: She is alert.  Psychiatric:        Mood and Affect: Mood normal.      Musculoskeletal Exam:  Shoulders full ROM no tenderness or swelling Elbows full ROM no tenderness or swelling Wrists full ROM no tenderness or swelling Fingers full ROM no tenderness or swelling Knees full ROM no tenderness or swelling Ankles full ROM no tenderness or swelling   Investigation: No additional findings.  Imaging: NM PET CT CARDIAC PERFUSION MULTI W/ABSOLUTE BLOODFLOW Result Date: 01/29/2024   LV perfusion is normal.   Rest left ventricular function is normal. Rest EF: 72%. Stress left ventricular function is normal. Stress EF: 72%. End diastolic cavity size is normal. End systolic cavity size is normal.   Myocardial blood flow was computed to be 0.61ml/g/min at rest and 2.9ml/g/min at stress. Global myocardial blood flow reserve was 3.18 and was normal.   Coronary calcium  was present on the attenuation correction CT images. Mild coronary calcifications were present. Coronary calcifications were present in the right coronary artery distribution(s).   The study is normal. The study is low risk.   Electronically Signed  By: Stanly Leavens M.D. CLINICAL DATA:  This over-read does not include interpretation of cardiac or coronary anatomy or pathology. No interpretation the PET data set. The cardiac PET-CT interpretation by the cardiologist is attached. COMPARISON:  None Available. FINDINGS: Limited view of the lung parenchyma demonstrates no suspicious nodularity. Airways are normal. Limited view of the mediastinum demonstrates no adenopathy. Esophagus normal. Limited view of the upper abdomen unremarkable. Bariatric surgery. Limited view of  the skeleton and chest wall is unremarkable. IMPRESSION: No significant extracardiac findings. Electronically Signed   By: Jackquline Boxer M.D.   On: 01/29/2024 09:09   Recent Labs: Lab Results  Component Value Date   WBC 4.1 10/29/2023   HGB 13.7 10/29/2023   PLT 319.0 10/29/2023   NA 140 10/29/2023   K 4.5 10/29/2023   CL 103 10/29/2023   CO2 29 10/29/2023   GLUCOSE 91 10/29/2023   BUN 11 10/29/2023   CREATININE 0.79 10/29/2023   BILITOT 0.2 10/29/2023   ALKPHOS 97 10/29/2023   AST 33 10/29/2023   ALT 39 (H) 10/29/2023   PROT 6.5 10/29/2023   ALBUMIN 4.2 10/29/2023   CALCIUM  9.2 10/29/2023   GFRAA 79 04/27/2020    Speciality Comments: No specialty comments available.  Procedures:  No procedures performed Allergies: Bupropion , Crestor  [rosuvastatin ], Losartan , Atorvastatin , Simvastatin , Amoxicillin, Aspirin , Codeine, Cyclobenzaprine , Erythromycin, and Penicillins   Assessment / Plan:     Visit Diagnoses: Raynaud's disease without gangrene - Plan: Sedimentation rate, ANA Screen,IFA,Reflex Titer/Pattern,Reflex Mplx 11 Ab Cascade with IdentRA, ANCA Screen Reflex Titer(QUEST), C-reactive protein, triamcinolone  cream (KENALOG ) 0.1 % Chronic Raynaud's syndrome affecting hands, nipples, and possibly toes, with color changes and burning sensation. No critical ischemia or tissue damage. Previous treatments ineffective. Modafinil  may exacerbate symptoms but she finds this medication highly important for ability to maintain adequate level of alertness for her work.  Was already prescribed amlodipine  not too clear if she is taking consistently or since how long. - Start amlodipine  5 mg once daily consistently, with follow-up in about 2 months should be adequate to gauge response and consider titration or switching to/adding another agent - Recheck  blood tests for inflammatory markers and ANA. - Consider topical triamcinolone  for nipple symptoms if no improvement. - Discuss modafinil 's  impact on symptoms.  Abnormal ANCA test - Plan: Sedimentation rate, ANA Screen,IFA,Reflex Titer/Pattern,Reflex Mplx 11 Ab Cascade with IdentRA, ANCA Screen Reflex Titer(QUEST), C-reactive protein Previous positive ANA test. Reassessment needed for inflammatory or autoimmune contribution to Raynaud's. - Order repeat ANA and inflammatory marker tests.   Chilblains, initial encounter - Plan: triamcinolone  cream (KENALOG ) 0.1 % Send review of her fingers but also some pictures in her describe symptoms sound like there may be component of chilblains or other localized cutaneous inflammation related to circulation problem though not recently level of critical digital ischemia or causing any ulcers or lesions. - Discussed try adding triamcinolone  0.1% as needed for any areas of residual inflammation or just for pain such as on the nipple or finger areas during or after episodes  Orders: Orders Placed This Encounter  Procedures   Sedimentation rate   ANA Screen,IFA,Reflex Titer/Pattern,Reflex Mplx 11 Ab Cascade with IdentRA   ANCA Screen Reflex Titer(QUEST)   C-reactive protein   Anti-nuclear ab-titer (ANA titer)   TIER 1   TIER 2   Tier 3   Interpretation   rflx Atypical p-ANCA Titer   Meds ordered this encounter  Medications   triamcinolone  cream (KENALOG ) 0.1 %    Sig: Apply 1 Application topically 2 (two) times daily as needed.    Dispense:  45 g    Refill:  0     Follow-Up Instructions: Return in about 2 months (around 04/09/2024) for Raynaud/?ANA on CCB f/u 2mos.   Lonni LELON Ester, MD  Note - This record has been created using [employer].  Chart creation errors have been sought, but may not always  have been located. Such creation errors do not reflect on  the standard of medical care.

## 2024-01-25 NOTE — Telephone Encounter (Signed)
   Pre-operative Risk Assessment    Patient Name: [name]  DOB: [birth year] MRN: [id number]   Date of last office visit: 12/11/23 DR. TOBB Date of next office visit: 04/08/24 DR. TOBB  Request for Surgical Clearance    Procedure:  Dental Extraction - Amount of Teeth to be Pulled:  1 TOOTH SURGICAL EXTRACTION  Date of Surgery:  Clearance TBD                                Surgeon:  CARMEL TO READ THE SIGNATURE  Surgeon's Group or Practice Name:  [facility]  Phone number:  [phone] Fax number:  [phone]   Type of Clearance Requested:   - Medical ; NONE INDICATED ON FORM TO BE HELD  FORM DOES ASK: IS THE PT ABE TO HAVE THIS EXTRACTION IN THE OFFICE OR WILL SHE NEED TO BE HOSPITALIZED    Type of Anesthesia:  Local    Additional requests/questions:    [name]   01/25/2024, 4:27 PM  3

## 2024-01-29 ENCOUNTER — Other Ambulatory Visit: Payer: Self-pay | Admitting: Family Medicine

## 2024-01-29 ENCOUNTER — Other Ambulatory Visit (HOSPITAL_COMMUNITY): Payer: Self-pay

## 2024-01-29 ENCOUNTER — Encounter (HOSPITAL_COMMUNITY)
Admission: RE | Admit: 2024-01-29 | Discharge: 2024-01-29 | Disposition: A | Discharge status: Home / Self Care | Source: Ambulatory Visit | Attending: Cardiology | Admitting: Cardiology

## 2024-01-29 DIAGNOSIS — R072 Precordial pain: Secondary | ICD-10-CM | POA: Diagnosis not present

## 2024-01-29 LAB — NM PET CT CARDIAC PERFUSION MULTI W/ABSOLUTE BLOODFLOW
MBFR: 3.18
Nuc Rest EF: 72 %
Nuc Stress EF: 72 %
Rest MBF: 0.92 ml/g/min
Rest Nuclear Isotope Dose: 21.8 mCi
ST Depression (mm): 0 mm
Stress MBF: 2.93 ml/g/min
Stress Nuclear Isotope Dose: 21.9 mCi

## 2024-01-29 MED ORDER — RUBIDIUM RB82 GENERATOR (RUBYFILL)
21.8400 | PACK | Freq: Once | INTRAVENOUS | Status: AC
  Administered 2024-01-29: 21.84 via INTRAVENOUS

## 2024-01-29 MED ORDER — REGADENOSON 0.4 MG/5ML IV SOLN
0.4000 mg | Freq: Once | INTRAVENOUS | Status: AC
  Administered 2024-01-29: 0.4 mg via INTRAVENOUS

## 2024-01-29 MED ORDER — RUBIDIUM RB82 GENERATOR (RUBYFILL)
21.8500 | PACK | Freq: Once | INTRAVENOUS | Status: AC
  Administered 2024-01-29: 21.85 via INTRAVENOUS

## 2024-01-29 MED ORDER — REGADENOSON 0.4 MG/5ML IV SOLN
INTRAVENOUS | Status: AC
  Filled 2024-01-29: qty 5

## 2024-01-29 MED ORDER — METOPROLOL TARTRATE 25 MG PO TABS
25.0000 mg | ORAL_TABLET | Freq: Two times a day (BID) | ORAL | 1 refills | Status: DC
  Filled 2024-01-29: qty 180, 90d supply, fill #0

## 2024-01-29 NOTE — Progress Notes (Signed)
 Pt. Tolerated lexi scan well.

## 2024-01-30 ENCOUNTER — Other Ambulatory Visit (HOSPITAL_COMMUNITY): Payer: Self-pay

## 2024-01-30 MED ORDER — FLUZONE 0.5 ML IM SUSY
0.5000 mL | PREFILLED_SYRINGE | Freq: Once | INTRAMUSCULAR | 0 refills | Status: AC
  Filled 2024-01-30: qty 0.5, 1d supply, fill #0

## 2024-01-31 ENCOUNTER — Other Ambulatory Visit (HOSPITAL_COMMUNITY): Payer: Self-pay

## 2024-02-05 ENCOUNTER — Encounter: Payer: Self-pay | Admitting: Cardiology

## 2024-02-05 ENCOUNTER — Other Ambulatory Visit (HOSPITAL_COMMUNITY): Payer: Self-pay

## 2024-02-05 ENCOUNTER — Ambulatory Visit: Payer: Self-pay | Admitting: Cardiology

## 2024-02-05 MED ORDER — METOPROLOL SUCCINATE ER 25 MG PO TB24
25.0000 mg | ORAL_TABLET | Freq: Two times a day (BID) | ORAL | 3 refills | Status: DC
  Filled 2024-02-05: qty 180, 90d supply, fill #0

## 2024-02-06 ENCOUNTER — Other Ambulatory Visit: Payer: Self-pay

## 2024-02-06 ENCOUNTER — Other Ambulatory Visit (HOSPITAL_COMMUNITY): Payer: Self-pay

## 2024-02-06 MED ORDER — LATANOPROST 0.005 % OP SOLN
1.0000 [drp] | Freq: Every day | OPHTHALMIC | 12 refills | Status: AC
  Filled 2024-02-06: qty 2.5, 50d supply, fill #0
  Filled 2024-03-24: qty 2.5, 50d supply, fill #1
  Filled 2024-03-28: qty 2.5, 25d supply, fill #1
  Filled 2024-04-20: qty 7.5, 75d supply, fill #2

## 2024-02-06 NOTE — Telephone Encounter (Signed)
 Dr. Sheena,  You saw this patient on 12/11/2023. Per protocol we request that you comment on his cardiac risk to proceed with dental extraction.    We normally do not recommend cardiac clearance for this, however, she had a cardiac PET scan ordered by you on last office visit. The dentist wants to know if she needs to have dental extraction in the hospital vs office.  The PET scan was normal on 01/29/2024 and you commented as such. Cardiac monitor on 01/11/2024 showed occasional PAC's and you placed her on metoprolol  succinate 25 mg BID.    I think she will be fine to have the extraction in the office. However would feel better letting dentist know your thoughts,  since it has been less than 2 months since evaluated in the office and has had testing. Please send your comment to P CV Pre-Op Pool.  Thank you, Lamarr Satterfield DNP, ANP, AACC.

## 2024-02-06 NOTE — Telephone Encounter (Signed)
   Patient Name: [name]  DOB: [birth year] MRN: [id number]  Primary Cardiologist: Kardie Tobb, DO  Chart reviewed as part of pre-operative protocol coverage. Given past medical history and time since last visit, based on ACC/AHA guidelines, KHLOI RAWL is at acceptable risk for the planned procedure without further cardiovascular testing.   She may have extraction in the office.   The patient was advised that if she develops new symptoms prior to surgery to contact our office to arrange for a follow-up visit, and she verbalized understanding.  I will route this recommendation to the requesting party via Epic fax function and remove from pre-op pool.  Please call with questions.  Lamarr Satterfield, NP 02/06/2024, 12:27 PM

## 2024-02-08 ENCOUNTER — Ambulatory Visit: Attending: Internal Medicine | Admitting: Internal Medicine

## 2024-02-08 ENCOUNTER — Other Ambulatory Visit (HOSPITAL_COMMUNITY): Payer: Self-pay

## 2024-02-08 ENCOUNTER — Encounter: Payer: Self-pay | Admitting: Internal Medicine

## 2024-02-08 VITALS — BP 126/70 | HR 83 | Temp 96.3°F | Resp 16 | Ht 63.0 in | Wt 187.3 lb

## 2024-02-08 DIAGNOSIS — T691XXA Chilblains, initial encounter: Secondary | ICD-10-CM | POA: Diagnosis not present

## 2024-02-08 DIAGNOSIS — I7300 Raynaud's syndrome without gangrene: Secondary | ICD-10-CM

## 2024-02-08 DIAGNOSIS — R7689 Other specified abnormal immunological findings in serum: Secondary | ICD-10-CM | POA: Diagnosis not present

## 2024-02-08 DIAGNOSIS — M797 Fibromyalgia: Secondary | ICD-10-CM | POA: Diagnosis not present

## 2024-02-08 MED ORDER — TRIAMCINOLONE ACETONIDE 0.1 % EX CREA
1.0000 | TOPICAL_CREAM | Freq: Two times a day (BID) | CUTANEOUS | 0 refills | Status: DC | PRN
  Filled 2024-02-08: qty 45, 23d supply, fill #0

## 2024-02-10 ENCOUNTER — Other Ambulatory Visit (HOSPITAL_COMMUNITY): Payer: Self-pay

## 2024-02-11 ENCOUNTER — Other Ambulatory Visit: Payer: Self-pay

## 2024-02-13 ENCOUNTER — Other Ambulatory Visit (HOSPITAL_COMMUNITY): Payer: Self-pay

## 2024-02-13 MED ORDER — METHYLPREDNISOLONE 4 MG PO TBPK
ORAL_TABLET | ORAL | 0 refills | Status: DC
  Filled 2024-02-13: qty 21, 6d supply, fill #0

## 2024-02-15 LAB — ANCA SCREEN W REFLEX TITER: ANCA SCREEN: POSITIVE — AB

## 2024-02-15 LAB — TIER 1
Chromatin (Nucleosomal) Antibody: <1 AI
ENA SM Ab Ser-aCnc: <1 AI
Ribonucleic Protein(ENA) Antibody, IgG: <1 AI
SM/RNP: <1 AI
ds DNA Ab: <1 [IU]/mL

## 2024-02-15 LAB — TIER 2
Jo-1 Autoabs: <1 AI
SSA (Ro) (ENA) Antibody, IgG: <1 AI
SSB (La) (ENA) Antibody, IgG: <1 AI
Scleroderma (Scl-70) (ENA) Antibody, IgG: <1 AI

## 2024-02-15 LAB — ANA SCREEN,IFA,REFLEX TITER/PATTERN,REFLEX MPLX 11 AB CASCADE
Anti Nuclear Antibody (ANA): POSITIVE — AB
Cyclic Citrullin Peptide Ab: <16 U
MUTATED CITRULLINATED VIMENTIN (MCV) AB: <20 U/mL (ref <20)
Rheumatoid fact SerPl-aCnc: <10 [IU]/mL (ref <14)

## 2024-02-15 LAB — INTERPRETATION

## 2024-02-15 LAB — TIER 3
Centromere Ab Screen: <1 AI
Ribosomal P Protein Ab: <1 AI

## 2024-02-15 LAB — ANTI-NUCLEAR AB-TITER (ANA TITER): ANA Titer 1: 1:40 {titer} — ABNORMAL HIGH

## 2024-02-15 LAB — C-REACTIVE PROTEIN: CRP: <3 mg/L (ref <8.0)

## 2024-02-15 LAB — RFLX ATYPICAL P-ANCA TITER: ATYPICAL P ANCA TITER: 1:160 {titer} — ABNORMAL HIGH

## 2024-02-15 LAB — SEDIMENTATION RATE: Sed Rate: 29 mm/h (ref 0–30)

## 2024-02-21 ENCOUNTER — Other Ambulatory Visit (HOSPITAL_COMMUNITY): Payer: Self-pay

## 2024-02-27 DIAGNOSIS — Z9884 Bariatric surgery status: Secondary | ICD-10-CM | POA: Diagnosis not present

## 2024-02-27 DIAGNOSIS — L659 Nonscarring hair loss, unspecified: Secondary | ICD-10-CM | POA: Diagnosis not present

## 2024-02-27 DIAGNOSIS — R790 Abnormal level of blood mineral: Secondary | ICD-10-CM | POA: Diagnosis not present

## 2024-02-27 DIAGNOSIS — I7300 Raynaud's syndrome without gangrene: Secondary | ICD-10-CM | POA: Diagnosis not present

## 2024-02-27 DIAGNOSIS — Z8639 Personal history of other endocrine, nutritional and metabolic disease: Secondary | ICD-10-CM | POA: Diagnosis not present

## 2024-02-27 DIAGNOSIS — Z6830 Body mass index (BMI) 30.0-30.9, adult: Secondary | ICD-10-CM | POA: Diagnosis not present

## 2024-02-27 DIAGNOSIS — E66811 Obesity, class 1: Secondary | ICD-10-CM | POA: Diagnosis not present

## 2024-02-27 DIAGNOSIS — E039 Hypothyroidism, unspecified: Secondary | ICD-10-CM | POA: Diagnosis not present

## 2024-02-27 DIAGNOSIS — E6609 Other obesity due to excess calories: Secondary | ICD-10-CM | POA: Diagnosis not present

## 2024-02-27 DIAGNOSIS — R5383 Other fatigue: Secondary | ICD-10-CM | POA: Diagnosis not present

## 2024-02-29 DIAGNOSIS — E669 Obesity, unspecified: Secondary | ICD-10-CM | POA: Diagnosis not present

## 2024-02-29 DIAGNOSIS — R5383 Other fatigue: Secondary | ICD-10-CM | POA: Diagnosis not present

## 2024-03-01 ENCOUNTER — Other Ambulatory Visit (HOSPITAL_COMMUNITY): Payer: Self-pay

## 2024-03-03 ENCOUNTER — Other Ambulatory Visit: Payer: Self-pay

## 2024-03-06 ENCOUNTER — Other Ambulatory Visit (HOSPITAL_COMMUNITY): Payer: Self-pay | Admitting: Psychiatry

## 2024-03-07 ENCOUNTER — Other Ambulatory Visit: Payer: Self-pay

## 2024-03-07 ENCOUNTER — Other Ambulatory Visit (HOSPITAL_COMMUNITY): Payer: Self-pay

## 2024-03-07 MED ORDER — BUSPIRONE HCL 10 MG PO TABS
10.0000 mg | ORAL_TABLET | Freq: Three times a day (TID) | ORAL | 0 refills | Status: DC
  Filled 2024-03-07: qty 90, 30d supply, fill #0

## 2024-03-09 NOTE — Assessment & Plan Note (Signed)
On NP Thyroid and being followed by endocrinology

## 2024-03-09 NOTE — Progress Notes (Unsigned)
 Subjective:    Patient ID: [name], female    DOB: 1960/07/25, 63 y.o.   MRN: [id number]  No chief complaint on file.   HPI Discussed the use of AI scribe software for clinical note transcription with the patient, who gave verbal consent to proceed.  History of Present Illness [name] is a 63 year old female who presents with concerns about memory issues and cognitive slowing.  She experiences cognitive slowing, particularly when trying to recall recipes while cooking or when driving, where she sometimes has to abruptly stop because it takes a moment to remember to stop at a light. 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 No episodes of getting lost while driving, but occasional lapses in focus are noted.  She has a history of gastric bypass surgery and reports difficulty with taking multivitamins due to the taste and her dental issues. She has been using chewable bariatric multivitamins but finds them difficult to consume and has been trying to dissolve them in water to make them more palatable. She also uses chia seeds and flax seeds for fiber intake. She has a history of low B12 levels and is currently taking zinc  supplements, which have improved her zinc  levels.  She experiences cold hands and feet, with her feet sometimes appearing purple, and reports a family history of similar symptoms. She keeps her feet warm and wears socks regularly. She also reports thickening of her toenails and dry skin.  She describes a burning sensation in her nipples, particularly when transitioning between cold and warm environments or when stressed. She has been using a cream prescribed by a previous doctor and also applies a mixture of olive oil and castor oil for relief.    Past Medical History:  Diagnosis Date   Acute bronchitis 05/25/2016   Anxiety     Arthritis    Chronic pain syndrome 01/06/2013   Chronic rhinosinusitis    Colon polyps    Cough 01/06/2013   Depression    Diabetes (HCC) 01/06/2013   Diabetes mellitus type 2 in obese 01/06/2013   Sees Dr Nancyann Ebbing for eye exam Does not see podiatry, foot exam today unremarkable except for thick cracking skin on heals  pt denies    Dyspnea    with exertion    Dysuria 05/25/2016   Edema 01/06/2013   Epistaxis 05/25/2016   Fibroid, uterine    Fibromyalgia    GERD (gastroesophageal reflux disease)    Glaucoma 03/2012   History of kidney stones    Hoarseness of voice 05/11/2013   Hyperglycemia 04/13/2013   Hyperlipemia, mixed 06/06/2007   Qualifier: Diagnosis of  By: Georgian ROSALEA CHARM Lamar She feels Lipitor caused increased low back pain and weakness     Hyperlipidemia    Hypertension    Hypokalemia 02/05/2013   Hypothyroidism    IBS (irritable bowel syndrome) 02/13/2011   Low back pain 03/03/2013   Muscle cramp 05/22/2014   Obesity    Obesity, unspecified 05/11/2013   OSA (obstructive sleep apnea)    cpap    Pain in joint, shoulder region 06/27/2015   Pre-diabetes    Raynaud disease 06/16/2013   Sleep apnea    Thyroid  disease    hypothyroidism   Vocal fold nodules     Past Surgical History:  Procedure Laterality Date   ABDOMINAL HYSTERECTOMY  ANAL RECTAL MANOMETRY N/A 03/22/2022   Procedure: ANO RECTAL MANOMETRY;  Surgeon: Saintclair Jasper, MD;  Location: [facility];  Service: Gastroenterology;  Laterality: N/A;   BREAST EXCISIONAL BIOPSY Right    at age 82   BREAST SURGERY  lump remove breast   age 35 yrs   CHOLECYSTECTOMY     colonoscopoy      CYSTECTOMY     DILATION AND CURETTAGE OF UTERUS     x2   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   Procedure: LAPAROSCOPIC ROUX-EN-Y GASTRIC BYPASS WITH UPPER ENDOSCOPY;  Surgeon: Tanda Locus, MD;  Location: [facility];  Service: General;  Laterality: N/A;   HERNIA REPAIR  5/22   I & D EXTREMITY Left 04/11/2018   Procedure:  DEBIDMENT DISTAL INTERPHALANGEAL LEFT MIDDLE;  Surgeon: Murrell Kuba, MD;  Location: [facility];  Service: Orthopedics;  Laterality: Left;   INCISIONAL HERNIA REPAIR  08/10/2020   Procedure: LAPAROSCOPIC PRIMARY REPAIR OF INCISIONAL HERNIA;  Surgeon: Tanda Locus, MD;  Location: [facility];  Service: General;;   MASS EXCISION Left 04/11/2018   Procedure: EXCISION MASS;  Surgeon: Murrell Kuba, MD;  Location: [facility];  Service: Orthopedics;  Laterality: Left;   NEPHROLITHOTOMY  10/14/2021   OOPHORECTOMY     POLYPECTOMY     ROTATOR CUFF REPAIR     SHOULDER ARTHROSCOPY WITH ROTATOR CUFF REPAIR Right 12/21/2022   Procedure: Right shoulder arthroscopy, debridement, subacromial decompression, distal clavicle resection, rotator cuff repair;  Surgeon: Melita Drivers, MD;  Location: [facility];  Service: Orthopedics;  Laterality: Right;   UPPER GI ENDOSCOPY N/A 08/10/2020   Procedure: UPPER GI ENDOSCOPY;  Surgeon: Tanda Locus, MD;  Location: [facility];  Service: General;  Laterality: N/A;    Family History  Problem Relation Age of Onset   Alcohol abuse Father    Cancer Father        renal and colon   Hyperlipidemia Father    Hypertension Father    Stroke Father    Heart disease Father    Cancer Paternal Grandfather        colon   Diabetes Other    High blood pressure Mother    High Cholesterol Mother    Kidney disease Mother    Depression Mother    Anxiety disorder Mother    Obesity Mother    Hypertension Mother    Breast cancer Other 59   Diabetes Sister    Diabetes Brother    Arthritis Maternal Grandmother    Heart disease Paternal Grandmother     Social History   Socioeconomic History   Marital status: Significant Other    Spouse name: Not on file   Number of children: 0   Years of education: Not on file   Highest education level: 12th grade  Occupational History   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  Tobacco Use   Smoking status: Former    Current  packs/day: 0.00    Average packs/day: 0.5 packs/day for 30.0 years (15.0 ttl pk-yrs)    Types: Cigarettes    Start date: 04/24/1973    Quit date: 04/25/2003    Years since quitting: 20.8    Passive exposure: Past   Smokeless tobacco: Never   Tobacco comments:    smoked since age 73  Vaping Use   Vaping status: Never Used  Substance and Sexual Activity   Alcohol use: No   Drug use: Never   Sexual activity: Not Currently    Partners: Male  Birth control/protection: None  Other Topics Concern   Not on file  Social History Narrative   Endo-- Dr Von   ENT--Dr shoemaker   GI--Dr Buccini   Pulm--Dr Clance   Rheum--Dr Everlean         Social Drivers of Health   Financial Resource Strain: Low Risk  (03/06/2024)   Overall Financial Resource Strain (CARDIA)    Difficulty of Paying Living Expenses: Not hard at all  Food Insecurity: No Food Insecurity (03/06/2024)   Hunger Vital Sign    Worried About Running Out of Food in the Last Year: Never true    Ran Out of Food in the Last Year: Never true  Transportation Needs: No Transportation Needs (03/06/2024)   PRAPARE - Administrator, Civil Service (Medical): No    Lack of Transportation (Non-Medical): No  Physical Activity: Sufficiently Active (03/06/2024)   Exercise Vital Sign    Days of Exercise per Week: 3 days    Minutes of Exercise per Session: 50 min  Stress: Stress Concern Present (03/06/2024)   [employer] of Occupational Health - Occupational Stress Questionnaire    Feeling of Stress: Rather much  Social Connections: Moderately Isolated (03/06/2024)   Social Connection and Isolation Panel    Frequency of Communication with Friends and Family: Once a week    Frequency of Social Gatherings with Friends and Family: More than three times a week    Attends Religious Services: Never    Database Administrator or Organizations: No    Attends Engineer, Structural: Not on file    Marital Status: Living  with partner  Intimate Partner Violence: Not on file    Outpatient Medications Prior to Visit  Medication Sig Dispense Refill   acarbose  (PRECOSE ) 50 MG tablet Take 1 tablet (50 mg total) by mouth 3 (three) times daily with meals. 90 tablet 11   acetaminophen  (TYLENOL ) 325 MG tablet Take 650 mg by mouth every 6 (six) hours as needed for moderate pain.     albuterol  (VENTOLIN  HFA) 108 (90 Base) MCG/ACT inhaler Inhale 2 puffs into the lungs every 4 (four) hours as needed for wheezing or shortness of breath. 18 g 0   amLODipine  (NORVASC ) 5 MG tablet Take 1 tablet (5 mg total) by mouth daily. 90 tablet 3   bisacodyl  (DULCOLAX) 5 MG EC tablet Take 20 mg by mouth daily as needed for moderate constipation.     busPIRone  (BUSPAR ) 10 MG tablet Take 1 tablet (10 mg total) by mouth 3 (three) times daily. 90 tablet 0   Evolocumab  (REPATHA  SURECLICK) 140 MG/ML SOAJ Inject 140 mg into the skin every 14 (fourteen) days. 6 mL 3   furosemide  (LASIX ) 20 MG tablet Take 1 tablet (20 mg total) by mouth daily as needed. 90 tablet 1   ipratropium (ATROVENT ) 0.06 % nasal spray Place 2 sprays into both nostrils 3 (three) times daily as needed for rhinitis (Patient taking differently: Place 2 sprays into both nostrils as needed.) 15 mL 12   latanoprost  (XALATAN ) 0.005 % ophthalmic solution Place 1 drop into both eyes every other day at night (Patient not taking: Reported on 02/08/2024) 2.5 mL 2   latanoprost  (XALATAN ) 0.005 % ophthalmic solution Place 1 drop into both eyes at bedtime. 2.5 mL 12   loratadine  (CLARITIN ) 10 MG tablet Take 0.5 tablets (5 mg total) by mouth daily as needed for allergies. Thurs, Sat, and Sun 36 tablet 3   Menthol, Topical Analgesic, (BIOFREEZE EX)  Apply 1 Application topically 3 (three) times a week. Thurs, Sat, and Sun. Rub on shoulders (Patient taking differently: Apply 1 Application topically as needed. Thurs, Sat, and Sun. Rub on shoulders)     Menthol, Topical Analgesic, (ICY HOT EX) Apply  1 Application topically 3 (three) times a week. Thurs, Sat, Sun. Rub on lower back (Patient taking differently: Apply 1 Application topically as needed. Thurs, Sat, Sun. Rub on lower back)     methylPREDNISolone  (MEDROL ) 4 MG TBPK tablet Use as recommended on dosepak. 21 each 0   metoprolol  succinate (TOPROL  XL) 25 MG 24 hr tablet Take 1 tablet (25 mg total) by mouth 2 (two) times daily. 180 tablet 3   Milnacipran  (SAVELLA ) 50 MG TABS tablet Take 1 tablet (50 mg total) by mouth 2 (two) times daily. 60 tablet 5   modafinil  (PROVIGIL ) 200 MG tablet Take 2 tablets (400 mg total) by mouth daily. 60 tablet 5   Pediatric Multivit-Minerals (FLINTSTONES COMPLETE) CHEW Chew 2 tablets by mouth daily.     spironolactone  (ALDACTONE ) 50 MG tablet Take 1 tablet (50 mg total) by mouth 3 (three) times daily. 270 tablet 3   thyroid  (ARMOUR) 60 MG tablet Take 1 tablet (60 mg total) by mouth once daily. 90 tablet 4   triamcinolone  cream (KENALOG ) 0.1 % Apply 1 Application topically 2 (two) times daily as needed. 45 g 0   Vitamin D , Ergocalciferol , (DRISDOL ) 1.25 MG (50000 UNIT) CAPS capsule Take 1 capsule (50,000 Units total) by mouth every 7 (seven) days. 12 capsule 1   Zinc  50 MG TABS Take by mouth.     No facility-administered medications prior to visit.    Allergies  Allergen Reactions   Bupropion  Other (See Comments)   Crestor  [Rosuvastatin ] Other (See Comments)    Myalgias and rising CPK   Losartan  Cough   Atorvastatin      myalgias   Simvastatin      NDC Code:16714068101 NDC Code:16714068101 NDC Rniz:[id number] Myalgias (Muscle Pain)   Amoxicillin Rash   Aspirin  Nausea And Vomiting and Other (See Comments)    GI upset    Codeine Rash   Cyclobenzaprine      Patient does not tolerate   Erythromycin Rash   Penicillins Rash    Reaction: 15 years    Review of Systems  Constitutional:  Positive for malaise/fatigue. Negative for fever.  HENT:  Negative for congestion.   Eyes:  Negative for  blurred vision.  Respiratory:  Negative for shortness of breath.   Cardiovascular:  Negative for chest pain, palpitations and leg swelling.  Gastrointestinal:  Negative for abdominal pain, blood in stool and nausea.  Genitourinary:  Negative for dysuria and frequency.  Musculoskeletal:  Negative for falls.  Skin:  Negative for rash.  Neurological:  Positive for tingling and sensory change. Negative for dizziness, loss of consciousness and headaches.  Endo/Heme/Allergies:  Negative for environmental allergies.  Psychiatric/Behavioral:  Positive for memory loss and suicidal ideas. Negative for depression. The patient is not nervous/anxious.        Objective:    Physical Exam Constitutional:      General: She is not in acute distress.    Appearance: Normal appearance. She is well-developed. She is not toxic-appearing.  HENT:     Head: Normocephalic and atraumatic.     Right Ear: External ear normal.     Left Ear: External ear normal.     Nose: Nose normal.  Eyes:     General:  Right eye: No discharge.        Left eye: No discharge.     Conjunctiva/sclera: Conjunctivae normal.  Neck:     Thyroid : No thyromegaly.  Cardiovascular:     Rate and Rhythm: Normal rate.     Heart sounds: Normal heart sounds. No murmur heard. Pulmonary:     Effort: Pulmonary effort is normal. No respiratory distress.     Breath sounds: Normal breath sounds.  Abdominal:     General: Bowel sounds are normal.     Palpations: Abdomen is soft.     Tenderness: There is no abdominal tenderness. There is no guarding.  Musculoskeletal:        General: Normal range of motion.     Cervical back: Neck supple.  Lymphadenopathy:     Cervical: No cervical adenopathy.  Skin:    General: Skin is warm and dry.  Neurological:     Mental Status: She is alert and oriented to person, place, and time.  Psychiatric:        Mood and Affect: Mood normal.        Behavior: Behavior normal.        Thought Content:  Thought content normal.        Judgment: Judgment normal.     There were no vitals taken for this visit. Wt Readings from Last 3 Encounters:  02/08/24 187 lb 4.8 oz (85 kg)  12/11/23 186 lb 12.8 oz (84.7 kg)  11/14/23 190 lb (86.2 kg)    Diabetic Foot Exam - Simple   No data filed    Lab Results  Component Value Date   WBC 4.1 10/29/2023   HGB 13.7 10/29/2023   HCT 40.7 10/29/2023   PLT 319.0 10/29/2023   GLUCOSE 91 10/29/2023   CHOL 123 11/12/2023   TRIG 84 11/12/2023   HDL 72 11/12/2023   LDLDIRECT 159.0 11/11/2019   LDLCALC 35 11/12/2023   ALT 39 (H) 10/29/2023   AST 33 10/29/2023   NA 140 10/29/2023   K 4.5 10/29/2023   CL 103 10/29/2023   CREATININE 0.79 10/29/2023   BUN 11 10/29/2023   CO2 29 10/29/2023   TSH 1.01 10/29/2023   HGBA1C 6.1 10/29/2023   MICROALBUR 2.16 (H) 01/06/2013    Lab Results  Component Value Date   TSH 1.01 10/29/2023   Lab Results  Component Value Date   WBC 4.1 10/29/2023   HGB 13.7 10/29/2023   HCT 40.7 10/29/2023   MCV 89.5 10/29/2023   PLT 319.0 10/29/2023   Lab Results  Component Value Date   NA 140 10/29/2023   K 4.5 10/29/2023   CO2 29 10/29/2023   GLUCOSE 91 10/29/2023   BUN 11 10/29/2023   CREATININE 0.79 10/29/2023   BILITOT 0.2 10/29/2023   ALKPHOS 97 10/29/2023   AST 33 10/29/2023   ALT 39 (H) 10/29/2023   PROT 6.5 10/29/2023   ALBUMIN 4.2 10/29/2023   CALCIUM  9.2 10/29/2023   ANIONGAP 11 12/11/2022   EGFR 58 (L) 06/22/2020   GFR 79.89 10/29/2023   Lab Results  Component Value Date   CHOL 123 11/12/2023   Lab Results  Component Value Date   HDL 72 11/12/2023   Lab Results  Component Value Date   LDLCALC 35 11/12/2023   Lab Results  Component Value Date   TRIG 84 11/12/2023   Lab Results  Component Value Date   CHOLHDL 1.7 11/12/2023   Lab Results  Component Value Date   HGBA1C 6.1  10/29/2023       Assessment & Plan:  Muscle cramp Assessment & Plan: Hydrate and monitor     Type 2 diabetes mellitus in patient with obesity [facility]) Assessment & Plan: hgba1c acceptable, minimize simple carbs. Increase exercise as tolerated. Continue current meds    Vitamin B12 deficiency Assessment & Plan: Supplement and monitor    Copper  metabolism disorder (HCC) Assessment & Plan: Hydrate and monitor    Hypothyroidism, unspecified type Assessment & Plan: On NP Thyroid  and being followed by endocrinology   Hypertension, unspecified type Assessment & Plan: Well controlled, no changes to meds. Encouraged heart healthy diet such as the DASH diet and exercise as tolerated.     Hyperlipemia, mixed Assessment & Plan: Encourage heart healthy diet such as MIND or DASH diet, increase exercise, avoid trans fats, simple carbohydrates and processed foods, consider a krill or fish or flaxseed oil cap daily.      Assessment and Plan Assessment & Plan Mild cognitive impairment possibly Reports difficulty with memory, such as forgetting recipes and needing time to recall words. Concerned about potential cognitive decline, especially while driving. Discussed age-related changes in cognitive function and the importance of lifestyle modifications to support brain health. Emphasized hydration, heart-healthy diet, and learning new skills to maintain cognitive function. Discussed potential referral to neurology for further evaluation if symptoms escalate. - Encouraged hydration and heart-healthy diet, including Mediterranean diet. - Advised taking multivitamin, fatty acids, and vitamin D . - Encouraged learning new skills to stimulate brain activity. - Will consider referral to neurology for further evaluation if symptoms escalate.  Raynaud's phenomenon involving hands, feet, and nipples Experiences cold hands and feet, with occasional burning sensation in nipples. Symptoms consistent with Raynaud's phenomenon. Discussed the role of circulation and lifestyle modifications to manage  symptoms. Advised against blood thinners due to gastric bypass history. - Encouraged exercise, hydration, and healthy diet to improve circulation. - Recommended Bomba socks for compression and warmth. - Advised against blood thinners due to gastric bypass history.  Peripheral neuropathy of feet Reports cold feet and rough skin, with some numbness in toes. Discussed the importance of foot care and circulation to prevent complications. - Recommended Bomba socks for compression and warmth. - Advised soaking feet in half distilled vinegar and half hot water nightly. - Suggested using antifungal cream or Vicks VapoRub for dry skin.  Onychomycosis (fungal infection of toenails) Thickened toenails with dry skin, likely due to fungal infection. Discussed treatment options including topical antifungal creams and oral medications, noting the risks associated with oral treatments. - Advised soaking feet in half distilled vinegar and half hot water nightly. - Recommended using antifungal cream or Vicks VapoRub for fungal infection. - Will consider oral antifungal medication if topical treatments are ineffective.  Vitamin B12 and Vitamin D  deficiency in context of bariatric surgery status Vitamin B12 levels are low normal. Discussed the importance of bariatric multivitamins and strategies to improve adherence, such as mixing chewables with water. - Ordered zinc  and magnesium  levels. - Encouraged adherence to bariatric multivitamin regimen. - Suggested mixing chewables with water to improve palatability.  Recording duration: 25 minutes     Harlene Horton, MD

## 2024-03-09 NOTE — Assessment & Plan Note (Signed)
 Hydrate and monitor

## 2024-03-09 NOTE — Assessment & Plan Note (Signed)
 Well controlled, no changes to meds. Encouraged heart healthy diet such as the DASH diet and exercise as tolerated.

## 2024-03-09 NOTE — Assessment & Plan Note (Signed)
 hgba1c acceptable, minimize simple carbs. Increase exercise as tolerated. Continue current meds

## 2024-03-09 NOTE — Assessment & Plan Note (Signed)
 Supplement and monitor

## 2024-03-09 NOTE — Assessment & Plan Note (Signed)
 Encourage heart healthy diet such as MIND or DASH diet, increase exercise, avoid trans fats, simple carbohydrates and processed foods, consider a krill or fish or flaxseed oil cap daily.

## 2024-03-10 ENCOUNTER — Other Ambulatory Visit: Payer: Self-pay | Admitting: Family Medicine

## 2024-03-10 ENCOUNTER — Other Ambulatory Visit (HOSPITAL_BASED_OUTPATIENT_CLINIC_OR_DEPARTMENT_OTHER): Payer: Self-pay

## 2024-03-10 ENCOUNTER — Ambulatory Visit: Admitting: Family Medicine

## 2024-03-10 ENCOUNTER — Encounter: Payer: Self-pay | Admitting: Family Medicine

## 2024-03-10 ENCOUNTER — Other Ambulatory Visit: Payer: Self-pay

## 2024-03-10 VITALS — BP 138/78 | HR 82 | Temp 98.4°F | Resp 16 | Ht 63.0 in | Wt 188.0 lb

## 2024-03-10 DIAGNOSIS — E559 Vitamin D deficiency, unspecified: Secondary | ICD-10-CM

## 2024-03-10 DIAGNOSIS — E538 Deficiency of other specified B group vitamins: Secondary | ICD-10-CM

## 2024-03-10 DIAGNOSIS — R252 Cramp and spasm: Secondary | ICD-10-CM

## 2024-03-10 DIAGNOSIS — E669 Obesity, unspecified: Secondary | ICD-10-CM

## 2024-03-10 DIAGNOSIS — E119 Type 2 diabetes mellitus without complications: Secondary | ICD-10-CM

## 2024-03-10 DIAGNOSIS — Z9884 Bariatric surgery status: Secondary | ICD-10-CM

## 2024-03-10 DIAGNOSIS — B353 Tinea pedis: Secondary | ICD-10-CM | POA: Diagnosis not present

## 2024-03-10 DIAGNOSIS — I7300 Raynaud's syndrome without gangrene: Secondary | ICD-10-CM

## 2024-03-10 DIAGNOSIS — I10 Essential (primary) hypertension: Secondary | ICD-10-CM

## 2024-03-10 DIAGNOSIS — E039 Hypothyroidism, unspecified: Secondary | ICD-10-CM | POA: Diagnosis not present

## 2024-03-10 DIAGNOSIS — E782 Mixed hyperlipidemia: Secondary | ICD-10-CM | POA: Diagnosis not present

## 2024-03-10 DIAGNOSIS — R202 Paresthesia of skin: Secondary | ICD-10-CM

## 2024-03-10 DIAGNOSIS — R200 Anesthesia of skin: Secondary | ICD-10-CM

## 2024-03-10 LAB — COMPREHENSIVE METABOLIC PANEL WITH GFR
ALT: 29 U/L (ref 0–35)
AST: 26 U/L (ref 0–37)
Albumin: 4 g/dL (ref 3.5–5.2)
Alkaline Phosphatase: 109 U/L (ref 39–117)
BUN: 8 mg/dL (ref 6–23)
CO2: 28 meq/L (ref 19–32)
Calcium: 9 mg/dL (ref 8.4–10.5)
Chloride: 106 meq/L (ref 96–112)
Creatinine, Ser: 0.7 mg/dL (ref 0.40–1.20)
GFR: 92.13 mL/min (ref >60.00)
Glucose, Bld: 56 mg/dL — ABNORMAL LOW (ref 70–99)
Potassium: 4 meq/L (ref 3.5–5.1)
Sodium: 143 meq/L (ref 135–145)
Total Bilirubin: 0.3 mg/dL (ref 0.2–1.2)
Total Protein: 6.2 g/dL (ref 6.0–8.3)

## 2024-03-10 LAB — CBC WITH DIFFERENTIAL/PLATELET
Basophils Absolute: 0 K/uL (ref 0.0–0.1)
Basophils Relative: 1.1 % (ref 0.0–3.0)
Eosinophils Absolute: 0.1 K/uL (ref 0.0–0.7)
Eosinophils Relative: 3.7 % (ref 0.0–5.0)
HCT: 40.9 % (ref 36.0–46.0)
Hemoglobin: 13.4 g/dL (ref 12.0–15.0)
Lymphocytes Relative: 38.5 % (ref 12.0–46.0)
Lymphs Abs: 1.5 K/uL (ref 0.7–4.0)
MCHC: 32.8 g/dL (ref 30.0–36.0)
MCV: 91.2 fl (ref 78.0–100.0)
Monocytes Absolute: 0.5 K/uL (ref 0.1–1.0)
Monocytes Relative: 11.9 % (ref 3.0–12.0)
Neutro Abs: 1.8 K/uL (ref 1.4–7.7)
Neutrophils Relative %: 44.8 % (ref 43.0–77.0)
Platelets: 334 K/uL (ref 150.0–400.0)
RBC: 4.48 Mil/uL (ref 3.87–5.11)
RDW: 13.7 % (ref 11.5–15.5)
WBC: 4 K/uL (ref 4.0–10.5)

## 2024-03-10 LAB — MAGNESIUM: Magnesium: 2 mg/dL (ref 1.5–2.5)

## 2024-03-10 LAB — LIPID PANEL
Cholesterol: 127 mg/dL (ref 0–200)
HDL: 75 mg/dL (ref >39.00)
LDL Cholesterol: 34 mg/dL (ref 0–99)
NonHDL: 51.83
Total CHOL/HDL Ratio: 2
Triglycerides: 87 mg/dL (ref 0.0–149.0)
VLDL: 17.4 mg/dL (ref 0.0–40.0)

## 2024-03-10 LAB — VITAMIN D 25 HYDROXY (VIT D DEFICIENCY, FRACTURES): VITD: 45.01 ng/mL (ref 30.00–100.00)

## 2024-03-10 LAB — TSH: TSH: 0.65 u[IU]/mL (ref 0.35–5.50)

## 2024-03-10 MED ORDER — PNEUMOCOCCAL 20-VAL CONJ VACC 0.5 ML IM SUSY
0.5000 mL | PREFILLED_SYRINGE | Freq: Once | INTRAMUSCULAR | 0 refills | Status: AC
  Filled 2024-03-10: qty 0.5, 1d supply, fill #0

## 2024-03-10 MED ORDER — BOOSTRIX 5-2.5-18.5 LF-MCG/0.5 IM SUSY
0.5000 mL | PREFILLED_SYRINGE | Freq: Once | INTRAMUSCULAR | 0 refills | Status: AC
  Filled 2024-03-10: qty 0.5, 1d supply, fill #0

## 2024-03-10 MED ORDER — MODAFINIL 200 MG PO TABS: 400.0000 mg | ORAL_TABLET | Freq: Every day | ORAL | 1 refills | Status: DC

## 2024-03-10 MED ORDER — MODAFINIL 200 MG PO TABS
400.0000 mg | ORAL_TABLET | Freq: Every day | ORAL | 0 refills | Status: AC
  Filled 2024-03-10: qty 180, 90d supply, fill #0

## 2024-03-10 NOTE — Assessment & Plan Note (Signed)
 Right foot and dry, flaky skin and then thick toenails. Vinegar soaks and then clotrimazole  or Vick's Vapor Rub

## 2024-03-10 NOTE — Assessment & Plan Note (Signed)
 Hands, feet and nipples sore with cold and stress. Is following with rheumatology, Dr Jeannetta

## 2024-03-10 NOTE — Telephone Encounter (Signed)
 Received message from pharmacy for Provigil - she is requesting a 90 day supply vs a 30 day supply. I didn't realize it was controlled and tried to send it.

## 2024-03-10 NOTE — Addendum Note (Signed)
 Addended by: Devory Mckinzie D on: 03/10/2024 10:56 AM   Modules accepted: Orders

## 2024-03-10 NOTE — Patient Instructions (Signed)
 Problems With Thinking and Memory (Mild Neurocognitive Disorder): What to Know Mild neurocognitive disorder, formerly known as mild cognitive impairment, is a disorder where your memory doesn't work as well as it should. It may also affect other mental abilities like thinking, communicating, behavior, and being able to finish tasks. These problems can be noticed and measured. But they usually don't stop you from doing daily activities or living on your own. Mild neurocognitive disorder usually happens after 63 years of age. But it can also happen at younger ages. It's not as serious as major neurocognitive disorder, also known as dementia, but it may be the first sign of it. In general, the symptoms of this condition get worse over time. In rare cases, symptoms can get better. What are the causes? This condition may be caused by: Brain disorders like Alzheimer's disease, Parkinson's disease, and other conditions that slowly damage nerve cells. Diseases that affect the blood vessels in the brain and cause small strokes. Certain infections, like HIV. Traumatic brain injury. Other medical conditions, such as brain tumors, underactive thyroid (hypothyroidism), and not having enough vitamin B12. Using certain drugs or medicines. What increases the risk? Being older than 63 years of age. Being female. Having a lower level of education. Diabetes, high blood pressure, high cholesterol, and other conditions that raise the risk for blood vessel diseases. Untreated or undertreated sleep apnea. Having a certain type of gene that can be inherited, or passed down from parent to child. Long-term health problems like heart disease, lung disease, liver disease, kidney disease, or depression. What are the signs or symptoms? Trouble remembering things. You may: Forget names, phone numbers, or details of recent events. Forget about social events and appointments. Often forget where you put your car keys or other  items. Trouble thinking and solving problems. You may have trouble with complex tasks like: Paying bills. Driving in places you don't know well. Trouble communicating. You may have trouble: Finding the right word or naming an object. Forming a sentence that makes sense. Understanding what you read or hear. Changes in your behavior or personality. When this happens, you may: Lose interest in the things you used to enjoy. Avoid being around people. Get angry more easily than usual. Act before thinking. How is this diagnosed? This condition is diagnosed based on: Your symptoms. Your health care provider may ask you and the people you spend time with, like family and friends, about your symptoms. Memory tests and other tests to check how your brain is working. Your provider may refer you to a provider called a neurologist or a mental health specialist. To try to find out the cause of your condition, your provider may: Get a detailed medical history. Ask about use of alcohol, drugs, and medicines. Do a physical exam. Order blood tests and brain imaging tests. How is this treated? Mild neurocognitive disorder that's caused by medicine use, drug use, infection, or another medical condition may get better when the cause is treated, or when medicines or drugs are stopped. If this disorder has another cause, it usually doesn't improve and may get worse. In these cases, the goal of treatment is to help you manage the symptoms. This may include: Medicines to help with memory and behavior symptoms. Talk therapy. This provides education, emotional support, memory aids, and other ways of making up for problems with mental tasks. Lifestyle changes. These may include: Getting regular exercise. Eating a healthy diet that includes omega-3 fatty acids. Doing things to challenge your thinking  and memory skills. Spending more time being with and talking to other people. Using routines like having regular  times for meals and going to bed. Follow these instructions at home: Eating and drinking  Drink more fluids as told. Eat a healthy diet that includes omega-3 fatty acids. These can be found in: Fish. Nuts. Leafy vegetables. Vegetable oils. If you drink alcohol: Limit how much you have to: 0-1 drink a day if you're female. 0-2 drinks a day if you're female. Know how much alcohol is in your drink. In the [place] one drink is one 12 oz bottle of beer (355 mL), one 5 oz glass of wine (148 mL), or one 1 oz glass of hard liquor (44 mL). Lifestyle  Get regular exercise as told by your provider. Do not smoke, vape, or use nicotine or tobacco. Use healthy ways to manage stress. If you need help managing stress, ask your provider. Keep spending time with other people. Keep your mind active by doing activities you enjoy, like reading or playing games. Make sure you get good sleep at night. These tips can help: Try not to take naps during the day. Keep your bedroom dark and cool. Do not exercise in the few hours before you go to bed. Do not have foods or drinks with caffeine at night. General instructions Take medicines only as told. Your provider may tell you to avoid taking medicines that can affect thinking. These include some medicines for pain or sleeping. Work with your provider to find out: What things you need help with. What your safety needs are. Where to find more information [employer] on Aging: [email or web address] Contact a health care provider if: You have any new symptoms. Get help right away if: You have new confusion or your confusion gets worse. You act in ways that put you or your family in danger. This information is not intended to replace advice given to you by your health care provider. Make sure you discuss any questions you have with your health care provider. Document Revised: 10/03/2022 Document Reviewed: 10/03/2022 Elsevier Patient Education  2024 [employer].

## 2024-03-11 ENCOUNTER — Ambulatory Visit: Payer: Self-pay | Admitting: Family Medicine

## 2024-03-11 LAB — HEMOGLOBIN A1C: Hgb A1c MFr Bld: 6.1 % (ref 4.6–6.5)

## 2024-03-11 LAB — VITAMIN B12: Vitamin B-12: 402 pg/mL (ref 211–911)

## 2024-03-13 LAB — EXTRA SPECIMEN

## 2024-03-13 LAB — ZINC

## 2024-03-17 ENCOUNTER — Telehealth: Payer: Self-pay | Admitting: *Deleted

## 2024-03-17 DIAGNOSIS — R200 Anesthesia of skin: Secondary | ICD-10-CM

## 2024-03-17 DIAGNOSIS — Z9884 Bariatric surgery status: Secondary | ICD-10-CM

## 2024-03-17 NOTE — Telephone Encounter (Signed)
 Left message on machine for patient to come back to be redrawn for Zinc  test.  Can make a lab only visit.  If she would like to go to [facility] she can do as well, but we will need to be notified to change test to that location.

## 2024-03-17 NOTE — Telephone Encounter (Signed)
 Received fax from [facility] that test for zinc  was not done due to no suitable specimen was received.

## 2024-03-18 ENCOUNTER — Other Ambulatory Visit (HOSPITAL_COMMUNITY): Payer: Self-pay

## 2024-03-24 ENCOUNTER — Other Ambulatory Visit: Payer: Self-pay

## 2024-03-24 ENCOUNTER — Other Ambulatory Visit (HOSPITAL_COMMUNITY): Payer: Self-pay

## 2024-03-24 DIAGNOSIS — Z9884 Bariatric surgery status: Secondary | ICD-10-CM

## 2024-03-24 DIAGNOSIS — R200 Anesthesia of skin: Secondary | ICD-10-CM

## 2024-03-25 ENCOUNTER — Other Ambulatory Visit (HOSPITAL_COMMUNITY): Payer: Self-pay

## 2024-03-25 ENCOUNTER — Other Ambulatory Visit: Payer: Self-pay

## 2024-03-28 ENCOUNTER — Other Ambulatory Visit: Payer: Self-pay

## 2024-03-28 ENCOUNTER — Other Ambulatory Visit (HOSPITAL_COMMUNITY): Payer: Self-pay

## 2024-03-28 LAB — ZINC: Zinc: 92 ug/dL (ref 60–130)

## 2024-03-29 ENCOUNTER — Other Ambulatory Visit (HOSPITAL_COMMUNITY): Payer: Self-pay

## 2024-03-29 ENCOUNTER — Ambulatory Visit: Payer: Self-pay | Admitting: Family Medicine

## 2024-03-31 ENCOUNTER — Other Ambulatory Visit: Payer: Self-pay

## 2024-04-01 ENCOUNTER — Other Ambulatory Visit: Payer: Self-pay

## 2024-04-01 DIAGNOSIS — K5902 Outlet dysfunction constipation: Secondary | ICD-10-CM | POA: Diagnosis not present

## 2024-04-01 DIAGNOSIS — R140 Abdominal distension (gaseous): Secondary | ICD-10-CM | POA: Diagnosis not present

## 2024-04-01 DIAGNOSIS — K5904 Chronic idiopathic constipation: Secondary | ICD-10-CM | POA: Diagnosis not present

## 2024-04-08 ENCOUNTER — Other Ambulatory Visit (HOSPITAL_COMMUNITY): Payer: Self-pay

## 2024-04-08 ENCOUNTER — Encounter: Payer: Self-pay | Admitting: Cardiology

## 2024-04-08 ENCOUNTER — Ambulatory Visit: Attending: Cardiology | Admitting: Cardiology

## 2024-04-08 VITALS — BP 130/78 | HR 82 | Ht 64.0 in | Wt 189.6 lb

## 2024-04-08 DIAGNOSIS — I10 Essential (primary) hypertension: Secondary | ICD-10-CM | POA: Diagnosis not present

## 2024-04-08 DIAGNOSIS — E782 Mixed hyperlipidemia: Secondary | ICD-10-CM | POA: Diagnosis not present

## 2024-04-08 DIAGNOSIS — I491 Atrial premature depolarization: Secondary | ICD-10-CM | POA: Diagnosis not present

## 2024-04-08 DIAGNOSIS — I2510 Atherosclerotic heart disease of native coronary artery without angina pectoris: Secondary | ICD-10-CM | POA: Diagnosis not present

## 2024-04-08 MED ORDER — METOPROLOL SUCCINATE ER 50 MG PO TB24
ORAL_TABLET | ORAL | 3 refills | Status: AC
  Filled 2024-04-08: qty 135, 90d supply, fill #0

## 2024-04-08 MED ORDER — AMLODIPINE BESYLATE 2.5 MG PO TABS
2.5000 mg | ORAL_TABLET | Freq: Every day | ORAL | 3 refills | Status: AC
  Filled 2024-04-08: qty 90, 90d supply, fill #0

## 2024-04-08 NOTE — Patient Instructions (Addendum)
 Medication Instructions:  Your physician has recommended you make the following change in your medication:  DECREASE: Amlodipine  2.5 mg once daily INCREASE: Toprol -XL 50 mg in the am, continue 25 mg at night  *If you need a refill on your cardiac medications before your next appointment, please call your pharmacy*  Follow-Up: At [facility], you and your health needs are our priority.  As part of our continuing mission to provide you with exceptional heart care, our providers are all part of one team.  This team includes your primary Cardiologist (physician) and Advanced Practice Providers or APPs (Physician Assistants and Nurse Practitioners) who all work together to provide you with the care you need, when you need it.  Your next appointment:   6 month(s)  Provider:   Kardie Tobb, DO     Other Instructions  Please join us  for our Annual Women's Heart Event on Feb, 6, 2026 at 8am.  Location: [address], [address] [place], [place]

## 2024-04-10 NOTE — Progress Notes (Signed)
 Cardiology Office Note:    Date:  04/10/2024   ID:  [name], DOB 05/05/1960, MRN [id number]  PCP:  Domenica Harlene LABOR, MD  Cardiologist:  Dub Huntsman, DO  Electrophysiologist:  None   Referring MD: Domenica Harlene LABOR, MD    I feel great  History of Present Illness:    [name] is a 63 y.o. female with a hx of hypertension, prediabetes, hyperlipidemia with statin intolerance mild coronary artery disease seen on coronary CTA  has tolerated PCSK9 inhibitors is here today for follow-up visit.   At her last visit she was experiencing chest pain. I ordered a PET CT scan. She had this done and it showed no evidence of ischemia.      Past Medical History:  Diagnosis Date   Acute bronchitis 05/25/2016   Anxiety    Arthritis    Chronic pain syndrome 01/06/2013   Chronic rhinosinusitis    Colon polyps    Cough 01/06/2013   Depression    Diabetes (HCC) 01/06/2013   Diabetes mellitus type 2 in obese 01/06/2013   Sees Dr Nancyann Ebbing for eye exam Does not see podiatry, foot exam today unremarkable except for thick cracking skin on heals  pt denies    Dyspnea    with exertion    Dysuria 05/25/2016   Edema 01/06/2013   Epistaxis 05/25/2016   Fibroid, uterine    Fibromyalgia    GERD (gastroesophageal reflux disease)    Glaucoma 03/2012   History of kidney stones    Hoarseness of voice 05/11/2013   Hyperglycemia 04/13/2013   Hyperlipemia, mixed 06/06/2007   Qualifier: Diagnosis of  By: Georgian ROSALEA CHARM Lamar She feels Lipitor caused increased low back pain and weakness     Hyperlipidemia    Hypertension    Hypokalemia 02/05/2013   Hypothyroidism    IBS (irritable bowel syndrome) 02/13/2011   Low back pain 03/03/2013   Muscle cramp 05/22/2014   Obesity    Obesity, unspecified 05/11/2013   OSA (obstructive sleep apnea)    cpap    Pain in joint, shoulder region 06/27/2015   Pre-diabetes    Raynaud disease 06/16/2013   Sleep apnea    Thyroid  disease     hypothyroidism   Vocal fold nodules     Past Surgical History:  Procedure Laterality Date   ABDOMINAL HYSTERECTOMY     ANAL RECTAL MANOMETRY N/A 03/22/2022   Procedure: ANO RECTAL MANOMETRY;  Surgeon: Saintclair Jasper, MD;  Location: [facility];  Service: Gastroenterology;  Laterality: N/A;   BREAST EXCISIONAL BIOPSY Right    at age 44   BREAST SURGERY  lump remove breast   age 52 yrs   CHOLECYSTECTOMY     colonoscopoy      CYSTECTOMY     DILATION AND CURETTAGE OF UTERUS     x2   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   Procedure: LAPAROSCOPIC ROUX-EN-Y GASTRIC BYPASS WITH UPPER ENDOSCOPY;  Surgeon: Tanda Locus, MD;  Location: [facility];  Service: General;  Laterality: N/A;   HERNIA REPAIR  5/22   I & D EXTREMITY Left 04/11/2018   Procedure: DEBIDMENT DISTAL INTERPHALANGEAL LEFT MIDDLE;  Surgeon: Murrell Kuba, MD;  Location: [facility];  Service: Orthopedics;  Laterality: Left;   INCISIONAL HERNIA REPAIR  08/10/2020   Procedure: LAPAROSCOPIC PRIMARY REPAIR OF INCISIONAL HERNIA;  Surgeon: Tanda Locus, MD;  Location: [facility];  Service: General;;   MASS EXCISION Left 04/11/2018   Procedure: EXCISION MASS;  Surgeon: Murrell,  Arley, MD;  Location: [facility];  Service: Orthopedics;  Laterality: Left;   NEPHROLITHOTOMY  10/14/2021   OOPHORECTOMY     POLYPECTOMY     ROTATOR CUFF REPAIR     SHOULDER ARTHROSCOPY WITH ROTATOR CUFF REPAIR Right 12/21/2022   Procedure: Right shoulder arthroscopy, debridement, subacromial decompression, distal clavicle resection, rotator cuff repair;  Surgeon: Melita Drivers, MD;  Location: [facility];  Service: Orthopedics;  Laterality: Right;   UPPER GI ENDOSCOPY N/A 08/10/2020   Procedure: UPPER GI ENDOSCOPY;  Surgeon: Tanda Locus, MD;  Location: [facility];  Service: General;  Laterality: N/A;    Current Medications: Current Meds  Medication Sig   acarbose  (PRECOSE ) 50 MG tablet Take 1 tablet (50 mg total) by mouth 3 (three) times daily with  meals.   acetaminophen  (TYLENOL ) 325 MG tablet Take 650 mg by mouth every 6 (six) hours as needed for moderate pain.   albuterol  (VENTOLIN  HFA) 108 (90 Base) MCG/ACT inhaler Inhale 2 puffs into the lungs every 4 (four) hours as needed for wheezing or shortness of breath.   amLODipine  (NORVASC ) 2.5 MG tablet Take 1 tablet (2.5 mg total) by mouth daily.   bisacodyl  (DULCOLAX) 5 MG EC tablet Take 20 mg by mouth daily as needed for moderate constipation.   busPIRone  (BUSPAR ) 10 MG tablet Take 1 tablet (10 mg total) by mouth 3 (three) times daily.   Evolocumab  (REPATHA  SURECLICK) 140 MG/ML SOAJ Inject 140 mg into the skin every 14 (fourteen) days.   furosemide  (LASIX ) 20 MG tablet Take 1 tablet (20 mg total) by mouth daily as needed.   ipratropium (ATROVENT ) 0.06 % nasal spray Place 2 sprays into both nostrils 3 (three) times daily as needed for rhinitis (Patient taking differently: Place 2 sprays into both nostrils as needed.)   latanoprost  (XALATAN ) 0.005 % ophthalmic solution Place 1 drop into both eyes every other day at night   latanoprost  (XALATAN ) 0.005 % ophthalmic solution Place 1 drop into both eyes at bedtime.   loratadine  (CLARITIN ) 10 MG tablet Take 0.5 tablets (5 mg total) by mouth daily as needed for allergies. Thurs, Sat, and Sun   Menthol, Topical Analgesic, (BIOFREEZE EX) Apply 1 Application topically 3 (three) times a week. Thurs, Sat, and Sun. Rub on shoulders (Patient taking differently: Apply 1 Application topically as needed. Thurs, Sat, and Sun. Rub on shoulders)   Menthol, Topical Analgesic, (ICY HOT EX) Apply 1 Application topically 3 (three) times a week. Thurs, Sat, Sun. Rub on lower back (Patient taking differently: Apply 1 Application topically as needed. Thurs, Sat, Sun. Rub on lower back)   methylPREDNISolone  (MEDROL ) 4 MG TBPK tablet Use as recommended on dosepak.   metoprolol  succinate (TOPROL  XL) 50 MG 24 hr tablet Take 1 tablet (50 mg total) by mouth in the morning AND  0.5 tablets (25 mg total) at bedtime. Take with or immediately following a meal.   Milnacipran  (SAVELLA ) 50 MG TABS tablet Take 1 tablet (50 mg total) by mouth 2 (two) times daily.   modafinil  (PROVIGIL ) 200 MG tablet Take 2 tablets (400 mg total) by mouth daily.   Pediatric Multivit-Minerals (FLINTSTONES COMPLETE) CHEW Chew 2 tablets by mouth daily.   spironolactone  (ALDACTONE ) 50 MG tablet Take 1 tablet (50 mg total) by mouth 3 (three) times daily.   thyroid  (ARMOUR) 60 MG tablet Take 1 tablet (60 mg total) by mouth once daily.   triamcinolone  cream (KENALOG ) 0.1 % Apply 1 Application topically 2 (two) times daily as needed.  Vitamin D , Ergocalciferol , (DRISDOL ) 1.25 MG (50000 UNIT) CAPS capsule Take 1 capsule (50,000 Units total) by mouth every 7 (seven) days.   Zinc  50 MG TABS Take by mouth.   [DISCONTINUED] amLODipine  (NORVASC ) 5 MG tablet Take 1 tablet (5 mg total) by mouth daily.   [DISCONTINUED] metoprolol  succinate (TOPROL  XL) 25 MG 24 hr tablet Take 1 tablet (25 mg total) by mouth 2 (two) times daily.     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  Tobacco Use   Smoking status: Former    Current packs/day: 0.00    Average packs/day: 0.5 packs/day for 30.0 years (15.0 ttl pk-yrs)    Types: Cigarettes    Start date: 04/24/1973    Quit date: 04/25/2003    Years since quitting: 20.9    Passive exposure: Past   Smokeless tobacco: Never   Tobacco comments:    smoked since age 52  Vaping Use   Vaping status: Never Used  Substance and Sexual Activity   Alcohol use: No   Drug use: Never   Sexual  activity: Not Currently    Partners: Male    Birth control/protection: None  Other Topics Concern   Not on file  Social History Narrative   Endo-- Dr Von   ENT--Dr shoemaker   GI--Dr Buccini   Pulm--Dr Clance   Rheum--Dr Everlean         Social Drivers of Health   Tobacco Use: Medium Risk (04/08/2024)   Patient History    Smoking Tobacco Use: Former    Smokeless Tobacco Use: Never    Passive Exposure: Past  Physicist, Medical Strain: Low Risk (03/06/2024)   Overall Financial Resource Strain (CARDIA)    Difficulty of Paying Living Expenses: Not hard at all  Food Insecurity: No Food Insecurity (03/06/2024)   Epic    Worried About Programme Researcher, Broadcasting/film/video in the Last Year: Never true    Ran Out of Food in the Last Year: Never true  Transportation Needs: No Transportation Needs (03/06/2024)   Epic    Lack of Transportation (Medical): No    Lack of Transportation (Non-Medical): No  Physical Activity: Sufficiently Active (03/06/2024)   Exercise Vital Sign    Days of Exercise per Week: 3 days    Minutes of Exercise per Session: 50 min  Stress: Stress Concern Present (03/06/2024)   [employer] of Occupational Health - Occupational Stress Questionnaire    Feeling of Stress: Rather much  Social Connections: Moderately Isolated (03/06/2024)   Social Connection and Isolation Panel    Frequency of Communication with Friends and Family: Once a week    Frequency of Social Gatherings with Friends and Family: More than three times a week    Attends Religious Services: Never    Database Administrator or Organizations: No    Attends Banker Meetings: Not on file    Marital Status: Living with partner  Depression (PHQ2-9): Medium Risk (03/10/2024)   Depression (PHQ2-9)    PHQ-2 Score: 6  Alcohol Screen: Not on file  Housing: Low Risk (03/06/2024)   Epic    Unable to Pay for Housing in  the Last Year: No    Number of Times Moved in the Last Year: 0    Homeless in the  Last Year: No  Utilities: Not on file  Health Literacy: Not on file     Family History: The patient's family history includes Alcohol abuse in her father; Anxiety disorder in her mother; Arthritis in her maternal grandmother; Breast cancer (age of onset: 61) in an other family member; Cancer in her father and paternal grandfather; Depression in her mother; Diabetes in her brother, sister, and another family member; Heart disease in her father and paternal grandmother; High Cholesterol in her mother; High blood pressure in her mother; Hyperlipidemia in her father; Hypertension in her father and mother; Kidney disease in her mother; Obesity in her mother; Stroke in her father.  ROS:   Review of Systems  Constitution: Negative for decreased appetite, fever and weight gain.  HENT: Negative for congestion, ear discharge, hoarse voice and sore throat.   Eyes: Negative for discharge, redness, vision loss in right eye and visual halos.  Cardiovascular: Negative for chest pain, dyspnea on exertion, leg swelling, orthopnea and palpitations.  Respiratory: Negative for cough, hemoptysis, shortness of breath and snoring.   Endocrine: Negative for heat intolerance and polyphagia.  Hematologic/Lymphatic: Negative for bleeding problem. Does not bruise/bleed easily.  Skin: Negative for flushing, nail changes, rash and suspicious lesions.  Musculoskeletal: Negative for arthritis, joint pain, muscle cramps, myalgias, neck pain and stiffness.  Gastrointestinal: Negative for abdominal pain, bowel incontinence, diarrhea and excessive appetite.  Genitourinary: Negative for decreased libido, genital sores and incomplete emptying.  Neurological: Negative for brief paralysis, focal weakness, headaches and loss of balance.  Psychiatric/Behavioral: Negative for altered mental status, depression and suicidal ideas.  Allergic/Immunologic: Negative for HIV exposure and persistent infections.    EKGs/Labs/Other Studies  Reviewed:    The following studies were reviewed today:   EKG: Sinus rhythm, 82 bpm with marked sinus arrhythmia  Recent Labs: 03/10/2024: ALT 29; BUN 8; Creatinine, Ser 0.70; Hemoglobin 13.4; Magnesium  2.0; Platelets 334.0; Potassium 4.0; Sodium 143; TSH 0.65  Recent Lipid Panel    Component Value Date/Time   CHOL 127 03/10/2024 1032   CHOL 123 11/12/2023 0811   TRIG 87.0 03/10/2024 1032   HDL 75.00 03/10/2024 1032   HDL 72 11/12/2023 0811   CHOLHDL 2 03/10/2024 1032   VLDL 17.4 03/10/2024 1032   LDLCALC 34 03/10/2024 1032   LDLCALC 35 11/12/2023 0811   LDLCALC 138 (H) 02/13/2020 0857   LDLDIRECT 159.0 11/11/2019 1125    Physical Exam:    VS:  BP 130/78 (BP Location: Right Arm, Patient Position: Sitting, Cuff Size: Normal)   Pulse 82   Ht 5' 4 (1.626 m)   Wt 189 lb 9.6 oz (86 kg)   SpO2 98%   BMI 32.54 kg/m     Wt Readings from Last 3 Encounters:  04/08/24 189 lb 9.6 oz (86 kg)  03/10/24 188 lb (85.3 kg)  02/08/24 187 lb 4.8 oz (85 kg)     GEN: Well nourished, well developed in no acute distress HEENT: Normal NECK: No JVD; No carotid bruits LYMPHATICS: No lymphadenopathy CARDIAC: S1S2 noted,RRR, no murmurs, rubs, gallops RESPIRATORY:  Clear to auscultation without rales, wheezing or rhonchi  ABDOMEN: Soft, non-tender, non-distended, +bowel sounds, no guarding. EXTREMITIES: No edema, No cyanosis, no clubbing MUSCULOSKELETAL:  No deformity  SKIN: Warm and dry NEUROLOGIC:  Alert and oriented x 3, non-focal PSYCHIATRIC:  Normal affect, good insight  ASSESSMENT:  1. Primary hypertension   2. Mild CAD   3. PAC (premature atrial contraction)   4. Hyperlipemia, mixed       PLAN:    Coronary artery disease - recent cardiac pet with no evidence of ischemia. LDL goal < 70 continue current lipid lowering agent with Repatha .    PAC- recent monitor showed occasional PAC. Switched from lopressor  to toprol  xl 25 mg BID prior to this visit,   Blood pressure  is acceptable, continue with current antihypertensive regimen.  Hyperlipidemia - continue with current statin medication.  The patient is in agreement with the above plan. The patient left the office in stable condition.  The patient will follow up in 1 year   Medication Adjustments/Labs and Tests Ordered: Current medicines are reviewed at length with the patient today.  Concerns regarding medicines are outlined above.  Orders Placed This Encounter  Procedures   EKG 12-Lead    Meds ordered this encounter  Medications   amLODipine  (NORVASC ) 2.5 MG tablet    Sig: Take 1 tablet (2.5 mg total) by mouth daily.    Dispense:  90 tablet    Refill:  3   metoprolol  succinate (TOPROL  XL) 50 MG 24 hr tablet    Sig: Take 1 tablet (50 mg total) by mouth in the morning AND 0.5 tablets (25 mg total) at bedtime. Take with or immediately following a meal.    Dispense:  135 tablet    Refill:  3     Patient Instructions  Medication Instructions:  Your physician has recommended you make the following change in your medication:  DECREASE: Amlodipine  2.5 mg once daily INCREASE: Toprol -XL 50 mg in the am, continue 25 mg at night  *If you need a refill on your cardiac medications before your next appointment, please call your pharmacy*  Follow-Up: At [facility], you and your health needs are our priority.  As part of our continuing mission to provide you with exceptional heart care, our providers are all part of one team.  This team includes your primary Cardiologist (physician) and Advanced Practice Providers or APPs (Physician Assistants and Nurse Practitioners) who all work together to provide you with the care you need, when you need it.  Your next appointment:   6 month(s)  Provider:   Shyah Cadmus, DO     Other Instructions  Please join us  for our Annual Women's Heart Event on Feb, 6, 2026 at 8am.  Location: [address], [address], [place]   Adopting a  Healthy Lifestyle.  Know what a healthy weight is for you (roughly BMI <25) and aim to maintain this   Aim for 7+ servings of fruits and vegetables daily   65-80+ fluid ounces of water or unsweet tea for healthy kidneys   Limit to max 1 drink of alcohol per day; avoid smoking/tobacco   Limit animal fats in diet for cholesterol and heart health - choose grass fed whenever available   Avoid highly processed foods, and foods high in saturated/trans fats   Aim for low stress - take time to unwind and care for your mental health   Aim for 150 min of moderate intensity exercise weekly for heart health, and weights twice weekly for bone health   Aim for 7-9 hours of sleep daily   When it comes to diets, agreement about the perfect plan isnt easy to find, even among the experts. Experts at the [facility] [employer] developed an idea known  as the Healthy Eating Plate. Just imagine a plate divided into logical, healthy portions.   The emphasis is on diet quality:   Load up on vegetables and fruits - one-half of your plate: Aim for color and variety, and remember that potatoes dont count.   Go for whole grains - one-quarter of your plate: Whole wheat, barley, wheat berries, quinoa, oats, brown rice, and foods made with them. If you want pasta, go with whole wheat pasta.   Protein power - one-quarter of your plate: Fish, chicken, beans, and nuts are all healthy, versatile protein sources. Limit red meat.   The diet, however, does go beyond the plate, offering a few other suggestions.   Use healthy plant oils, such as olive, canola, soy, corn, sunflower and peanut. Check the labels, and avoid partially hydrogenated oil, which have unhealthy trans fats.   If youre thirsty, drink water. Coffee and tea are good in moderation, but skip sugary drinks and limit milk and dairy products to one or two daily servings.   The type of carbohydrate in the diet is more important than the  amount. Some sources of carbohydrates, such as vegetables, fruits, whole grains, and beans-are healthier than others.   Finally, stay active  Signed, Khalidah Herbold, DO  04/10/2024 6:05 PM    [facility]

## 2024-04-14 ENCOUNTER — Other Ambulatory Visit: Payer: Self-pay | Admitting: Family Medicine

## 2024-04-14 ENCOUNTER — Other Ambulatory Visit (HOSPITAL_COMMUNITY): Payer: Self-pay

## 2024-04-14 ENCOUNTER — Other Ambulatory Visit: Payer: Self-pay

## 2024-04-14 MED ORDER — SAVELLA 50 MG PO TABS
50.0000 mg | ORAL_TABLET | Freq: Two times a day (BID) | ORAL | 5 refills | Status: DC
  Filled 2024-04-14: qty 60, 30d supply, fill #0
  Filled 2024-05-10: qty 60, 30d supply, fill #1

## 2024-04-20 ENCOUNTER — Other Ambulatory Visit (HOSPITAL_COMMUNITY): Payer: Self-pay

## 2024-04-21 ENCOUNTER — Other Ambulatory Visit (HOSPITAL_COMMUNITY): Payer: Self-pay | Admitting: Psychiatry

## 2024-04-21 MED ORDER — BUSPIRONE HCL 10 MG PO TABS
10.0000 mg | ORAL_TABLET | Freq: Three times a day (TID) | ORAL | 0 refills | Status: AC
  Filled 2024-04-21: qty 90, 30d supply, fill #0

## 2024-04-22 ENCOUNTER — Other Ambulatory Visit: Payer: Self-pay

## 2024-04-22 ENCOUNTER — Other Ambulatory Visit (HOSPITAL_COMMUNITY): Payer: Self-pay

## 2024-04-28 ENCOUNTER — Other Ambulatory Visit: Payer: Self-pay

## 2024-04-28 NOTE — Progress Notes (Signed)
 "  Office Visit Note  Patient: [name]             Date of Birth: 1960/11/03           MRN: [id number]             PCP: Domenica Harlene LABOR, MD Referring: Domenica Harlene LABOR, MD Visit Date: 05/02/2024   Subjective:  Discussed the use of AI scribe software for clinical note transcription with the patient, who gave verbal consent to proceed.  History of Present Illness   [name] is a 64 y.o. female here for follow up with Raynaud's phenomenon who presents with worsening symptoms of nipple pain and hand discoloration.  She has been experiencing ongoing circulation problems, which have not improved despite medication adjustments. She uses a cream and a mixture of castor oil and olive oil for relief, which provides some help. Symptoms occur on her breast and other areas, temporarily alleviated by these topical treatments.  She has a history of dizziness associated with Norvasc  (amlodipine ) use, which led to a dosage adjustment to 2.5 mg. Her current medication regimen includes metoprolol  50 mg in the morning and 25 mg in the afternoon, but she continues to experience dizziness and swelling in her legs, describing the sensation as feeling 'drunk'.  She reports that her recent blood tests included a P-ANCA antibody marker, which she discussed with her doctor. She has seen a GI doctor and completed a breath test two days ago to check for small intestine bacterial overgrowth, with results pending.  She has a history of low blood sugar issues, which began sooner than expected after her weight loss surgery. She is not currently on diabetes medications, but she monitors her blood sugar levels closely. She also has high eye pressures and sees her eye doctor every three to six months.  She has multiple lipomas, which she describes as 'little fatty things' that appear on various parts of her body.      Previous HPI 02/08/2024 [name] is a 64 year old female with Raynaud's phenomenon  who presents with worsening symptoms of nipple pain and hand discoloration, and was last seen 02/2022.   She has been experiencing intense burning and redness in the left nipple for about a year, exacerbated by temperature changes, particularly when transitioning from hot to cold environments. The pain is most severe at the nipple area and has not improved with the use of Vaseline and castor oil. She is hesitant to use Lanacane due to potential burning sensations. The pain affects her daily activities, causing nausea and discomfort, especially after exercising or at work.   Her history of Raynaud's phenomenon involves her hands turning blue or white and becoming painful, particularly in response to cold or stress. She describes her hands as 'ice cold' and aching. Recently, she received a shot in her thumb and had x-rays performed. Her feet are also affected, with some toenails detaching, and she applies lard to her feet to prevent dryness.   Her immediately relevant current medications include amlodipine  5 mg daily and modafinil , taken on Monday, Wednesday, Friday, Saturday, and Sunday. She previously tried nifedipine  and tadalafil  without significant improvement.   She has a positive ANA test and has previously undergone blood tests to check for inflammatory markers these were unremarkable so never started on any DMARD treatment..         Previous HPI 02/28/2022 [name] is a 64 y.o. female here for follow up for  raynaud's symptoms on nifedipine  30 mg daily discontinue tadalafil  due to seeing no clinical improvement.  Symptoms were decreased over the summertime but she is having more symptoms again with the cooler weather.  She is noticing some increased erythema around the base of the fingernails.  She also saw hand surgery over the interval and was diagnosed with carpal tunnel syndrome.   Previous HPI 06/27/2021 [name] is a 65 y.o. female here for follow up for raynaud's symptoms  on nifedipine  30 mg daily and starting tadalafil  5 mg daily after last visit. She has not seen a significant improvement in symptoms since starting the medication. She also does not report any side effects. She notices some increased joint pain and cysts overlying her finger joints on both hands. She reports intermittent episodes of gross blood in urine. Workup for this without clear cause established so far. She had recent labs checked showing mild CK elevation. She had CK elevation previously including in 2016 rheum evaluation with normal inflammatory markers and negative ANA.   Previous HPI 03/30/21 [name] is a 64 y.o. female here for follow up with raynaud's symptoms and abnormal labs initial visit demonstrating increased serum IgM of 433 ALT of 43 and ESR 38. We also recommended switching back to a single calcium  channel blocker due to peripheral edema increased on combination of amlodipine  and nifedipine .  Now she is just taking the Procardia  30 mg daily and had resolution of the lower extremity edema.  Hand discoloration pain and tightness continues intermittently as before.   Previous HPI 03/16/21 [name] is a 64 y.o. female here for evaluation of raynaud's symptoms and abnormal labs. She has had cold hands and feet longstanding but the development of discoloration in her fingers is newer in particular since her bariatric surgery earlier this year. She experiences triphasic color changes with cold exposure most often although sometimes without clear provocation. She denies any digital pitting, peeling, or ulceration change. She takes precautions for cold wearing gloves and wearing clothes sleeping at night to She was previously on amlodipine  5 mg PO daily as well as metoprolol  for hypertension. This was recommended switching to nifedipine  30 mg PO daily after vascular surgery evaluation with findings consistent for raynaud's. She restarted the amlodipine  again in combination with  this since yesterday and reports noticing ankle swelling this morning. She takes modafinil  400 mg about 4 days per week for help with work concentration unchanged for about 10 years.    Labs reviewed 02/2021 ANCA ATYP P-ANCA 1:160     Review of Systems  Constitutional:  Positive for fatigue.  HENT:  Negative for mouth sores and mouth dryness.   Eyes:  Negative for dryness.  Respiratory:  Negative for shortness of breath.   Cardiovascular:  Negative for chest pain and palpitations.  Gastrointestinal:  Positive for constipation. Negative for blood in stool and diarrhea.  Endocrine: Negative for increased urination.  Genitourinary:  Negative for involuntary urination.  Musculoskeletal:  Positive for joint pain, gait problem, joint pain, joint swelling, myalgias, muscle weakness, morning stiffness, muscle tenderness and myalgias.  Skin:  Positive for color change, hair loss and sensitivity to sunlight. Negative for rash.  Allergic/Immunologic: Negative for susceptible to infections.  Neurological:  Positive for headaches. Negative for dizziness.  Hematological:  Negative for swollen glands.  Psychiatric/Behavioral:  Positive for depressed mood. Negative for sleep disturbance. The patient is nervous/anxious.     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   Hypoglycemia 06/18/2022   Pernio 02/28/2022   Elevated liver function tests 02/12/2022   Tinea corporis 11/07/2021   Kidney stone 11/07/2021   Hematuria 08/05/2021   Pelvic pain 08/05/2021   Vitamin B12 deficiency 05/09/2021   Diverticula of intestine 03/16/2021   Sciatica 03/16/2021   Abnormal ANCA test 03/16/2021   High risk medication use 03/16/2021   Familial hypercholesterolemia 12/31/2020   Fibromyalgia 12/20/2020   Raynaud's phenomenon 12/07/2020    Gastric bypass status for obesity 08/11/2020   Chronic idiopathic constipation 07/15/2020   Mild CAD 05/13/2020   Anal pain 04/12/2020   Dysphonia 04/12/2020   Epigastric pain 04/12/2020   Family history of malignant neoplasm of gastrointestinal tract 04/12/2020   Left lower quadrant pain 04/12/2020   Thoracic and lumbosacral neuritis 04/12/2020   Vocal fold nodules    Thyroid  disease    Hypertension    GERD (gastroesophageal reflux disease)    Fibroid, uterine    Depressive disorder    Chronic rhinosinusitis    Anxiety    Hemorrhoids 02/22/2020   Statin intolerance 12/28/2019   Elevated CK 11/12/2019   Lymphadenopathy 11/12/2019   Neck pain 11/04/2019   Cervical radiculopathy 05/19/2019   Numbness and tingling 04/17/2019   Tachycardia 03/03/2019   Umbilical hernia 01/19/2019   Sun-damaged skin 01/19/2019   Preventative health care 01/19/2019   Right shoulder pain 01/16/2019   Carpal tunnel syndrome of right wrist 09/04/2018   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/26/2018   Bilateral hand pain 01/30/2018   Mucoid cyst, joint 01/30/2018   Nodule of finger of both hands 01/10/2018   Osteoarthritis of spine with radiculopathy, cervical region 07/24/2016   Dysuria 05/25/2016   Arthritis 05/25/2016   Obesity (BMI 30.0-34.9) 02/18/2016   Left shoulder pain 06/27/2015   Pain in joint, shoulder region 06/27/2015   Myalgia 03/27/2015   Muscle cramp 05/22/2014   Mass of soft tissue of neck 02/23/2014   AP (abdominal pain) 02/15/2014   Hoarseness of voice 05/11/2013   Backache 03/03/2013   Low back pain 03/03/2013   Hypokalemia 02/05/2013   Edema 01/06/2013   Type 2 diabetes mellitus in patient with obesity (HCC) 01/06/2013   Chronic pain syndrome 01/06/2013   Cough 01/06/2013   Perimenopause 10/05/2012   Glaucoma    Dyspnea 09/10/2011   Vitamin D  deficiency 02/13/2011   RHINITIS, CHRONIC 01/14/2010   Atypical chest pain 06/16/2008   THYROMEGALY 11/11/2007    Allergic rhinitis 11/11/2007   ECZEMA, HANDS 07/22/2007   Hypothyroidism 06/06/2007   Hyperlipemia, mixed 06/06/2007   ADJ DISORDER WITH MIXED ANXIETY & DEPRESSED MOOD 06/06/2007   OSA (obstructive sleep apnea) 06/06/2007   History of colonic polyps 06/06/2007    Past Medical History:  Diagnosis Date   Acute bronchitis 05/25/2016   Anxiety    Arthritis    Chronic pain syndrome 01/06/2013   Chronic rhinosinusitis    Colon polyps    Cough 01/06/2013   Depression    Diabetes (HCC) 01/06/2013   Diabetes mellitus type 2 in obese 01/06/2013   Sees Dr Nancyann Ebbing for eye exam Does not see podiatry, foot exam today unremarkable except for thick cracking skin on heals  pt denies    Dyspnea    with exertion    Dysuria 05/25/2016   Edema 01/06/2013   Epistaxis 05/25/2016   Fibroid,  uterine    Fibromyalgia    GERD (gastroesophageal reflux disease)    Glaucoma 03/2012   History of kidney stones    Hoarseness of voice 05/11/2013   Hyperglycemia 04/13/2013   Hyperlipemia, mixed 06/06/2007   Qualifier: Diagnosis of  By: Georgian ROSALEA CHARM Lamar She feels Lipitor caused increased low back pain and weakness     Hyperlipidemia    Hypertension    Hypokalemia 02/05/2013   Hypothyroidism    IBS (irritable bowel syndrome) 02/13/2011   Low back pain 03/03/2013   Muscle cramp 05/22/2014   Obesity    Obesity, unspecified 05/11/2013   OSA (obstructive sleep apnea)    cpap    Pain in joint, shoulder region 06/27/2015   Pre-diabetes    Raynaud disease 06/16/2013   Sleep apnea    Thyroid  disease    hypothyroidism   Vocal fold nodules     Family History  Problem Relation Age of Onset   Alcohol abuse Father    Cancer Father        renal and colon   Hyperlipidemia Father    Hypertension Father    Stroke Father    Heart disease Father    Cancer Paternal Grandfather        colon   Diabetes Other    High blood pressure Mother    High Cholesterol Mother    Kidney disease Mother     Depression Mother    Anxiety disorder Mother    Obesity Mother    Hypertension Mother    Breast cancer Other 31   Diabetes Sister    Diabetes Brother    Arthritis Maternal Grandmother    Heart disease Paternal Grandmother    Past Surgical History:  Procedure Laterality Date   ABDOMINAL HYSTERECTOMY     ANAL RECTAL MANOMETRY N/A 03/22/2022   Procedure: ANO RECTAL MANOMETRY;  Surgeon: Saintclair Jasper, MD;  Location: [facility];  Service: Gastroenterology;  Laterality: N/A;   BREAST EXCISIONAL BIOPSY Right    at age 41   BREAST SURGERY  lump remove breast   age 25 yrs   CHOLECYSTECTOMY     colonoscopoy      CYSTECTOMY     DILATION AND CURETTAGE OF UTERUS     x2   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   Procedure: LAPAROSCOPIC ROUX-EN-Y GASTRIC BYPASS WITH UPPER ENDOSCOPY;  Surgeon: Tanda Locus, MD;  Location: [facility];  Service: General;  Laterality: N/A;   HERNIA REPAIR  5/22   I & D EXTREMITY Left 04/11/2018   Procedure: DEBIDMENT DISTAL INTERPHALANGEAL LEFT MIDDLE;  Surgeon: Murrell Kuba, MD;  Location: [facility];  Service: Orthopedics;  Laterality: Left;   INCISIONAL HERNIA REPAIR  08/10/2020   Procedure: LAPAROSCOPIC PRIMARY REPAIR OF INCISIONAL HERNIA;  Surgeon: Tanda Locus, MD;  Location: [facility];  Service: General;;   MASS EXCISION Left 04/11/2018   Procedure: EXCISION MASS;  Surgeon: Murrell Kuba, MD;  Location:  SURGERY CENTER;  Service: Orthopedics;  Laterality: Left;   NEPHROLITHOTOMY  10/14/2021   OOPHORECTOMY     POLYPECTOMY     ROTATOR CUFF REPAIR     SHOULDER ARTHROSCOPY WITH ROTATOR CUFF REPAIR Right 12/21/2022   Procedure: Right shoulder arthroscopy, debridement, subacromial decompression, distal clavicle resection, rotator cuff repair;  Surgeon: Melita Drivers, MD;  Location: [facility];  Service: Orthopedics;  Laterality: Right;   UPPER GI ENDOSCOPY N/A 08/10/2020   Procedure: UPPER GI ENDOSCOPY;  Surgeon: Tanda Locus, MD;  Location: [facility];  Service: General;  Laterality: N/A;   Social History   Social History Narrative   Endo-- Dr Von   ENT--Dr shoemaker   GI--Dr Buccini   Pulm--Dr Clance   Rheum--Dr Everlean         Immunization History  Administered Date(s) Administered   INFLUENZA, HIGH DOSE SEASONAL PF 01/06/2013, 01/10/2016, 01/10/2018, 01/16/2019, 01/02/2020   Influenza Split 01/10/2021   Influenza Whole 01/23/2008, 01/28/2009, 02/16/2010   Influenza, Seasonal, Injecte, Preservative Fre 01/06/2013, 02/13/2023, 01/30/2024   Influenza,inj,Quad PF,6+ Mos 01/06/2013, 01/10/2016, 01/10/2018, 01/16/2019, 01/02/2020   Influenza-Unspecified 01/22/2014, 02/09/2015, 01/17/2017, 01/20/2022   PFIZER Comirnaty(Gray Top)Covid-19 Tri-Sucrose Vaccine 05/05/2019, 05/26/2019   PFIZER(Purple Top)SARS-COV-2 Vaccination 05/05/2019, 05/26/2019   PNEUMOCOCCAL CONJUGATE-20 03/10/2024   Pneumococcal Conjugate-13 05/22/2014   Pneumococcal Polysaccharide-23 03/22/2015   Td 12/18/2000   Tdap 12/18/2000, 02/10/2014, 03/10/2024   Zoster Recombinant(Shingrix ) 01/10/2018, 05/21/2018     Objective: Vital Signs: BP 123/77   Pulse 71   Temp (!) 97.2 F (36.2 C)   Resp 16   Ht 5' 4 (1.626 m)   Wt 191 lb 12.8 oz (87 kg)   BMI 32.92 kg/m    Physical Exam Eyes:     Conjunctiva/sclera: Conjunctivae normal.  Cardiovascular:     Rate and Rhythm: Normal rate and regular rhythm.  Pulmonary:     Effort: Pulmonary effort is normal.     Breath sounds: Normal breath sounds.  Skin:    General: Skin is warm and dry.     Comments: Slightly tortuous and enlarged nailfold capillaries seen No associated skin thickening, nail pitting, or telangiectasias   Neurological:     Mental Status: She is alert.  Psychiatric:        Mood and Affect: Mood normal.      Musculoskeletal Exam:  Shoulders full ROM no tenderness or swelling Elbows full ROM no tenderness or swelling Wrists full ROM no tenderness or swelling Fingers full ROM no  tenderness or swelling Knees full ROM no tenderness or swelling   Investigation: No additional findings.  Imaging: No results found.  Recent Labs: Lab Results  Component Value Date   WBC 4.0 03/10/2024   HGB 13.4 03/10/2024   PLT 334.0 03/10/2024   NA 143 03/10/2024   K 4.0 03/10/2024   CL 106 03/10/2024   CO2 28 03/10/2024   GLUCOSE 56 (L) 03/10/2024   BUN 8 03/10/2024   CREATININE 0.70 03/10/2024   BILITOT 0.3 03/10/2024   ALKPHOS 109 03/10/2024   AST 26 03/10/2024   ALT 29 03/10/2024   PROT 6.2 03/10/2024   ALBUMIN 4.0 03/10/2024   CALCIUM  9.0 03/10/2024   GFRAA 79 04/27/2020    Speciality Comments: No specialty comments available.  Procedures:  No procedures performed Allergies: Bupropion , Crestor  [rosuvastatin ], Losartan , Atorvastatin , Simvastatin , Amoxicillin, Aspirin , Codeine, Cyclobenzaprine , Erythromycin, and Penicillins   Assessment / Plan:     Visit Diagnoses: Raynaud's disease without gangrene - Plan: hydroxychloroquine  (PLAQUENIL ) 200 MG tablet Abnormal ANCA test - Plan: hydroxychloroquine  (PLAQUENIL ) 200 MG tablet Abnormal P-ANCA suggests possible vasculitis or bowel inflammation. Hydroxychloroquine  considered for anti-inflammatory effects and circulation improvement without affecting blood pressure. Discussed side effects including weak anticoagulant effect, bruising, bleeding, and rare vision impairment risk. Emphasized monitoring for side effects and annual eye exams for long-term use. Hydroxychloroquine  may lower blood sugar, especially with diabetes medications. - Initiated hydroxychloroquine  200 mg daily - Continuing CCB as tolerated currently amlodipine  2.5 mg - Monitor for side effects such as stomach upset, rash, or other adverse reactions. - Recheck antibody  levels after 2-3 months of therapy. - Schedule follow-up appointment in March or April to assess treatment efficacy.        Orders: No orders of the defined types were placed in this  encounter.  Meds ordered this encounter  Medications   hydroxychloroquine  (PLAQUENIL ) 200 MG tablet    Sig: Take 1 tablet (200 mg total) by mouth daily.    Dispense:  90 tablet    Refill:  0     Follow-Up Instructions: Return in about 10 weeks (around 07/11/2024) for Raynauds/?ANCA HCQ start f/u 2-89mos.   Lonni LELON Ester, MD  Note - This record has been created using [employer].  Chart creation errors have been sought, but may not always  have been located. Such creation errors do not reflect on  the standard of medical care. "

## 2024-05-02 ENCOUNTER — Other Ambulatory Visit: Payer: Self-pay

## 2024-05-02 ENCOUNTER — Ambulatory Visit: Attending: Internal Medicine | Admitting: Internal Medicine

## 2024-05-02 ENCOUNTER — Encounter: Payer: Self-pay | Admitting: Internal Medicine

## 2024-05-02 VITALS — BP 123/77 | HR 71 | Temp 97.2°F | Resp 16 | Ht 64.0 in | Wt 191.8 lb

## 2024-05-02 DIAGNOSIS — I7300 Raynaud's syndrome without gangrene: Secondary | ICD-10-CM | POA: Diagnosis not present

## 2024-05-02 DIAGNOSIS — T691XXA Chilblains, initial encounter: Secondary | ICD-10-CM

## 2024-05-02 DIAGNOSIS — R7689 Other specified abnormal immunological findings in serum: Secondary | ICD-10-CM

## 2024-05-02 MED ORDER — HYDROXYCHLOROQUINE SULFATE 200 MG PO TABS
200.0000 mg | ORAL_TABLET | Freq: Every day | ORAL | 0 refills | Status: AC
  Filled 2024-05-02: qty 90, 90d supply, fill #0

## 2024-05-02 NOTE — Patient Instructions (Signed)
 SABRA

## 2024-05-05 ENCOUNTER — Other Ambulatory Visit (HOSPITAL_COMMUNITY): Payer: Self-pay

## 2024-05-05 ENCOUNTER — Encounter (HOSPITAL_COMMUNITY): Payer: Self-pay

## 2024-05-05 ENCOUNTER — Other Ambulatory Visit: Payer: Self-pay | Admitting: Family

## 2024-05-05 MED ORDER — VITAMIN D (ERGOCALCIFEROL) 1.25 MG (50000 UNIT) PO CAPS
50000.0000 [IU] | ORAL_CAPSULE | ORAL | 1 refills | Status: AC
  Filled 2024-05-05: qty 12, 84d supply, fill #0

## 2024-05-06 ENCOUNTER — Other Ambulatory Visit (HOSPITAL_COMMUNITY): Payer: Self-pay

## 2024-05-06 ENCOUNTER — Other Ambulatory Visit: Payer: Self-pay

## 2024-05-06 MED ORDER — DEXCOM G7 SENSOR MISC
12 refills | Status: AC
  Filled 2024-05-06: qty 3, 30d supply, fill #0

## 2024-05-11 ENCOUNTER — Other Ambulatory Visit: Payer: Self-pay | Admitting: Internal Medicine

## 2024-05-11 ENCOUNTER — Other Ambulatory Visit (HOSPITAL_COMMUNITY): Payer: Self-pay

## 2024-05-11 DIAGNOSIS — R7689 Other specified abnormal immunological findings in serum: Secondary | ICD-10-CM

## 2024-05-11 DIAGNOSIS — T691XXA Chilblains, initial encounter: Secondary | ICD-10-CM

## 2024-05-11 DIAGNOSIS — I7300 Raynaud's syndrome without gangrene: Secondary | ICD-10-CM

## 2024-05-12 ENCOUNTER — Encounter: Payer: Self-pay | Admitting: Family Medicine

## 2024-05-12 ENCOUNTER — Other Ambulatory Visit: Payer: Self-pay

## 2024-05-12 ENCOUNTER — Other Ambulatory Visit (HOSPITAL_COMMUNITY): Payer: Self-pay

## 2024-05-12 MED ORDER — TRIAMCINOLONE ACETONIDE 0.1 % EX CREA
1.0000 | TOPICAL_CREAM | Freq: Two times a day (BID) | CUTANEOUS | 0 refills | Status: AC | PRN
  Filled 2024-05-12: qty 45, 90d supply, fill #0

## 2024-05-12 NOTE — Telephone Encounter (Signed)
 Last Fill: 02/08/2024  Next Visit: 07/11/2024  Last Visit: 05/02/2024  Dx: Chilblains   Current Dose per office note on 05/02/2024: not discussed   Okay to refill Triamcinolone  Cream?

## 2024-05-14 ENCOUNTER — Other Ambulatory Visit: Payer: Self-pay | Admitting: Family Medicine

## 2024-05-14 MED ORDER — SAVELLA 50 MG PO TABS
50.0000 mg | ORAL_TABLET | Freq: Two times a day (BID) | ORAL | 3 refills | Status: AC
  Filled 2024-05-14: qty 120, 60d supply, fill #0

## 2024-05-15 ENCOUNTER — Other Ambulatory Visit: Payer: Self-pay

## 2024-05-15 ENCOUNTER — Other Ambulatory Visit (HOSPITAL_COMMUNITY): Payer: Self-pay

## 2024-05-23 ENCOUNTER — Other Ambulatory Visit: Payer: Self-pay

## 2024-05-26 ENCOUNTER — Other Ambulatory Visit (HOSPITAL_COMMUNITY): Payer: Self-pay

## 2024-05-26 ENCOUNTER — Telehealth (HOSPITAL_COMMUNITY): Admitting: Psychiatry

## 2024-05-26 ENCOUNTER — Encounter (HOSPITAL_COMMUNITY): Payer: Self-pay | Admitting: Psychiatry

## 2024-05-26 DIAGNOSIS — M797 Fibromyalgia: Secondary | ICD-10-CM

## 2024-05-26 DIAGNOSIS — F411 Generalized anxiety disorder: Secondary | ICD-10-CM

## 2024-05-27 ENCOUNTER — Other Ambulatory Visit (HOSPITAL_COMMUNITY): Payer: Self-pay

## 2024-05-27 MED ORDER — ACARBOSE 50 MG PO TABS
50.0000 mg | ORAL_TABLET | Freq: Three times a day (TID) | ORAL | 11 refills | Status: AC
  Filled 2024-05-27: qty 270, 90d supply, fill #0

## 2024-05-28 ENCOUNTER — Other Ambulatory Visit: Payer: Self-pay

## 2024-05-29 ENCOUNTER — Other Ambulatory Visit (HOSPITAL_COMMUNITY): Payer: Self-pay

## 2024-05-30 ENCOUNTER — Encounter: Payer: Self-pay | Admitting: *Deleted

## 2024-05-30 ENCOUNTER — Other Ambulatory Visit: Payer: Self-pay

## 2024-05-30 ENCOUNTER — Other Ambulatory Visit (HOSPITAL_COMMUNITY): Payer: Self-pay

## 2024-05-30 LAB — POCT ABI - SCREENING FOR PILOT NO CHARGE
Left ABI: 1.19
Right ABI: 1.22

## 2024-05-30 MED ORDER — DOXYCYCLINE HYCLATE 100 MG PO CAPS
100.0000 mg | ORAL_CAPSULE | Freq: Two times a day (BID) | ORAL | 0 refills | Status: AC
  Filled 2024-05-30: qty 20, 10d supply, fill #0

## 2024-05-30 NOTE — Progress Notes (Signed)
 Pt. Attended the women's heart event on 05/30/2024. Pt ABI Right is 1.22 and left 1.19. Pt declined any SDOH, insecurity, and pt documented she is not a smoker. Pt indicated pcp as Harlene DELENA Horton, MD and pt has private insurance coverage.

## 2024-07-07 ENCOUNTER — Ambulatory Visit: Admitting: Family Medicine

## 2024-07-11 ENCOUNTER — Ambulatory Visit: Admitting: Internal Medicine

## 2024-08-01 ENCOUNTER — Ambulatory Visit: Admitting: Internal Medicine

## 2024-11-03 ENCOUNTER — Encounter: Admitting: Family Medicine

## 2024-11-10 ENCOUNTER — Encounter: Admitting: Family Medicine

## 2024-11-24 ENCOUNTER — Telehealth (HOSPITAL_COMMUNITY): Admitting: Psychiatry
